# Patient Record
Sex: Female | Born: 1940 | Race: White | Hispanic: No | Smoking: Former smoker
Health system: Southern US, Community
[De-identification: ages and names within clinical notes are randomized; demographics above are authoritative.]

## PROBLEM LIST (undated history)

## (undated) DIAGNOSIS — K219 Gastro-esophageal reflux disease without esophagitis: Secondary | ICD-10-CM

## (undated) DIAGNOSIS — I519 Heart disease, unspecified: Secondary | ICD-10-CM

## (undated) DIAGNOSIS — E119 Type 2 diabetes mellitus without complications: Secondary | ICD-10-CM

## (undated) DIAGNOSIS — I10 Essential (primary) hypertension: Secondary | ICD-10-CM

## (undated) DIAGNOSIS — C801 Malignant (primary) neoplasm, unspecified: Secondary | ICD-10-CM

## (undated) HISTORY — PX: ABDOMINAL HYSTERECTOMY: SHX81

## (undated) HISTORY — PX: APPENDECTOMY: SHX54

## (undated) HISTORY — PX: TONSILLECTOMY: SUR1361

## (undated) HISTORY — PX: BREAST SURGERY: SHX581

---

## 1997-06-01 DIAGNOSIS — C50919 Malignant neoplasm of unspecified site of unspecified female breast: Secondary | ICD-10-CM | POA: Insufficient documentation

## 2004-08-18 ENCOUNTER — Emergency Department: Payer: Self-pay | Admitting: Emergency Medicine

## 2006-11-01 ENCOUNTER — Emergency Department: Payer: Self-pay | Admitting: Emergency Medicine

## 2007-05-13 DIAGNOSIS — K2270 Barrett's esophagus without dysplasia: Secondary | ICD-10-CM | POA: Insufficient documentation

## 2008-01-28 ENCOUNTER — Emergency Department: Payer: Self-pay | Admitting: Emergency Medicine

## 2009-08-04 ENCOUNTER — Emergency Department: Payer: Self-pay | Admitting: Emergency Medicine

## 2010-10-20 ENCOUNTER — Emergency Department: Payer: Self-pay | Admitting: Emergency Medicine

## 2013-03-09 ENCOUNTER — Emergency Department: Payer: Self-pay | Admitting: Emergency Medicine

## 2014-02-12 ENCOUNTER — Emergency Department: Payer: Self-pay | Admitting: Emergency Medicine

## 2014-05-10 DIAGNOSIS — G4730 Sleep apnea, unspecified: Secondary | ICD-10-CM | POA: Diagnosis present

## 2014-05-30 DIAGNOSIS — R911 Solitary pulmonary nodule: Secondary | ICD-10-CM | POA: Insufficient documentation

## 2014-09-02 ENCOUNTER — Ambulatory Visit: Payer: Self-pay

## 2014-09-19 ENCOUNTER — Emergency Department: Payer: Self-pay | Admitting: Emergency Medicine

## 2014-09-19 LAB — COMPREHENSIVE METABOLIC PANEL
Albumin: 3.1 g/dL — ABNORMAL LOW (ref 3.4–5.0)
Alkaline Phosphatase: 94 U/L
Anion Gap: 7 (ref 7–16)
BUN: 10 mg/dL (ref 7–18)
Bilirubin,Total: 0.3 mg/dL (ref 0.2–1.0)
Calcium, Total: 8.6 mg/dL (ref 8.5–10.1)
Chloride: 107 mmol/L (ref 98–107)
Co2: 25 mmol/L (ref 21–32)
Creatinine: 0.87 mg/dL (ref 0.60–1.30)
EGFR (African American): >60
EGFR (Non-African Amer.): >60
Glucose: 120 mg/dL — ABNORMAL HIGH (ref 65–99)
Osmolality: 278 (ref 275–301)
Potassium: 3.6 mmol/L (ref 3.5–5.1)
SGOT(AST): 91 U/L — ABNORMAL HIGH (ref 15–37)
SGPT (ALT): 85 U/L — ABNORMAL HIGH
Sodium: 139 mmol/L (ref 136–145)
Total Protein: 7.4 g/dL (ref 6.4–8.2)

## 2014-09-19 LAB — CBC WITH DIFFERENTIAL/PLATELET
Basophil #: 0.1 10*3/uL (ref 0.0–0.1)
Basophil %: 0.6 %
Eosinophil #: 0.3 10*3/uL (ref 0.0–0.7)
Eosinophil %: 2.6 %
HCT: 37.8 % (ref 35.0–47.0)
HGB: 12.2 g/dL (ref 12.0–16.0)
Lymphocyte #: 2.6 10*3/uL (ref 1.0–3.6)
Lymphocyte %: 23.7 %
MCH: 27.5 pg (ref 26.0–34.0)
MCHC: 32.4 g/dL (ref 32.0–36.0)
MCV: 85 fL (ref 80–100)
Monocyte #: 0.6 x10 3/mm (ref 0.2–0.9)
Monocyte %: 5.3 %
Neutrophil #: 7.6 10*3/uL — ABNORMAL HIGH (ref 1.4–6.5)
Neutrophil %: 67.8 %
Platelet: 266 10*3/uL (ref 150–440)
RBC: 4.44 10*6/uL (ref 3.80–5.20)
RDW: 16.1 % — ABNORMAL HIGH (ref 11.5–14.5)
WBC: 11.2 10*3/uL — ABNORMAL HIGH (ref 3.6–11.0)

## 2014-11-11 ENCOUNTER — Emergency Department: Payer: Self-pay | Admitting: Emergency Medicine

## 2014-11-12 ENCOUNTER — Emergency Department: Payer: Self-pay | Admitting: Emergency Medicine

## 2014-12-09 ENCOUNTER — Emergency Department: Payer: Self-pay | Admitting: Emergency Medicine

## 2014-12-28 ENCOUNTER — Emergency Department
Admit: 2014-12-28 | Disposition: A | Discharge status: Home / Self Care | Payer: Self-pay | Admitting: Emergency Medicine

## 2015-01-02 ENCOUNTER — Ambulatory Visit
Admit: 2015-01-02 | Disposition: A | Discharge status: Home / Self Care | Payer: Self-pay | Attending: Family Medicine | Admitting: Family Medicine

## 2015-01-07 ENCOUNTER — Emergency Department: Admit: 2015-01-07 | Disposition: A | Discharge status: Home / Self Care | Payer: Self-pay

## 2015-01-07 LAB — BASIC METABOLIC PANEL
Anion Gap: 7 (ref 7–16)
BUN: 16 mg/dL
Calcium, Total: 9.4 mg/dL
Chloride: 103 mmol/L
Co2: 29 mmol/L
Creatinine: 0.69 mg/dL
EGFR (African American): >60
EGFR (Non-African Amer.): >60
Glucose: 125 mg/dL — ABNORMAL HIGH
Potassium: 3.9 mmol/L
Sodium: 139 mmol/L

## 2015-01-07 LAB — URINALYSIS, COMPLETE
Bilirubin,UR: NEGATIVE
Blood: NEGATIVE
Glucose,UR: NEGATIVE mg/dL (ref 0–75)
Ketone: NEGATIVE
Leukocyte Esterase: NEGATIVE
Nitrite: NEGATIVE
Ph: 7 (ref 4.5–8.0)
Protein: NEGATIVE
Specific Gravity: 1.014 (ref 1.003–1.030)

## 2015-01-07 LAB — CBC WITH DIFFERENTIAL/PLATELET
Basophil #: 0.1 10*3/uL (ref 0.0–0.1)
Basophil %: 0.7 %
Eosinophil #: 0.3 10*3/uL (ref 0.0–0.7)
Eosinophil %: 2.8 %
HCT: 34.8 % — ABNORMAL LOW (ref 35.0–47.0)
HGB: 11.2 g/dL — ABNORMAL LOW (ref 12.0–16.0)
Lymphocyte #: 2.5 10*3/uL (ref 1.0–3.6)
Lymphocyte %: 22.4 %
MCH: 26.7 pg (ref 26.0–34.0)
MCHC: 32.2 g/dL (ref 32.0–36.0)
MCV: 83 fL (ref 80–100)
Monocyte #: 0.8 x10 3/mm (ref 0.2–0.9)
Monocyte %: 7 %
Neutrophil #: 7.4 10*3/uL — ABNORMAL HIGH (ref 1.4–6.5)
Neutrophil %: 67.1 %
Platelet: 219 10*3/uL (ref 150–440)
RBC: 4.19 10*6/uL (ref 3.80–5.20)
RDW: 16.5 % — ABNORMAL HIGH (ref 11.5–14.5)
WBC: 11 10*3/uL (ref 3.6–11.0)

## 2015-02-06 ENCOUNTER — Emergency Department
Admission: EM | Admit: 2015-02-06 | Discharge: 2015-02-06 | Disposition: A | Discharge status: Home / Self Care | Payer: Medicare Other | Attending: Emergency Medicine | Admitting: Emergency Medicine

## 2015-02-06 ENCOUNTER — Emergency Department: Payer: Medicare Other

## 2015-02-06 ENCOUNTER — Other Ambulatory Visit: Payer: Self-pay

## 2015-02-06 ENCOUNTER — Encounter: Payer: Self-pay | Admitting: Emergency Medicine

## 2015-02-06 DIAGNOSIS — E119 Type 2 diabetes mellitus without complications: Secondary | ICD-10-CM

## 2015-02-06 DIAGNOSIS — I10 Essential (primary) hypertension: Secondary | ICD-10-CM | POA: Insufficient documentation

## 2015-02-06 DIAGNOSIS — Z87891 Personal history of nicotine dependence: Secondary | ICD-10-CM | POA: Insufficient documentation

## 2015-02-06 DIAGNOSIS — R42 Dizziness and giddiness: Secondary | ICD-10-CM | POA: Diagnosis present

## 2015-02-06 HISTORY — DX: Malignant (primary) neoplasm, unspecified: C80.1

## 2015-02-06 HISTORY — DX: Heart disease, unspecified: I51.9

## 2015-02-06 HISTORY — DX: Gastro-esophageal reflux disease without esophagitis: K21.9

## 2015-02-06 HISTORY — DX: Essential (primary) hypertension: I10

## 2015-02-06 LAB — URINALYSIS COMPLETE WITH MICROSCOPIC (ARMC ONLY)
Bilirubin Urine: NEGATIVE
Glucose, UA: 50 mg/dL — AB
Hgb urine dipstick: NEGATIVE
Ketones, ur: NEGATIVE mg/dL
Leukocytes, UA: NEGATIVE
Nitrite: NEGATIVE
Protein, ur: NEGATIVE mg/dL
Specific Gravity, Urine: 1.019 (ref 1.005–1.030)
pH: 6 (ref 5.0–8.0)

## 2015-02-06 LAB — CBC
HCT: 34 % — ABNORMAL LOW (ref 35.0–47.0)
Hemoglobin: 11.3 g/dL — ABNORMAL LOW (ref 12.0–16.0)
MCH: 29.1 pg (ref 26.0–34.0)
MCHC: 33.3 g/dL (ref 32.0–36.0)
MCV: 87.3 fL (ref 80.0–100.0)
Platelets: 242 10*3/uL (ref 150–440)
RBC: 3.9 MIL/uL (ref 3.80–5.20)
RDW: 16.5 % — ABNORMAL HIGH (ref 11.5–14.5)
WBC: 9.6 10*3/uL (ref 3.6–11.0)

## 2015-02-06 LAB — BASIC METABOLIC PANEL
Anion gap: 10 (ref 5–15)
BUN: 20 mg/dL (ref 6–20)
CO2: 23 mmol/L (ref 22–32)
Calcium: 9 mg/dL (ref 8.9–10.3)
Chloride: 106 mmol/L (ref 101–111)
Creatinine, Ser: 0.71 mg/dL (ref 0.44–1.00)
GFR calc Af Amer: >60 mL/min (ref >60)
GFR calc non Af Amer: >60 mL/min (ref >60)
Glucose, Bld: 269 mg/dL — ABNORMAL HIGH (ref 65–99)
Potassium: 3.7 mmol/L (ref 3.5–5.1)
Sodium: 139 mmol/L (ref 135–145)

## 2015-02-06 LAB — GLUCOSE, CAPILLARY: Glucose-Capillary: 252 mg/dL — ABNORMAL HIGH (ref 65–99)

## 2015-02-06 MED ORDER — ACETAMINOPHEN 500 MG PO TABS
1000.0000 mg | ORAL_TABLET | Freq: Once | ORAL | Status: AC
  Administered 2015-02-06: 1000 mg via ORAL

## 2015-02-06 MED ORDER — ACETAMINOPHEN 500 MG PO TABS
ORAL_TABLET | ORAL | Status: AC
  Administered 2015-02-06: 1000 mg via ORAL
  Filled 2015-02-06: qty 2

## 2015-02-06 MED ORDER — METFORMIN HCL 500 MG PO TABS: 500.0000 mg | ORAL_TABLET | Freq: Two times a day (BID) | ORAL | Status: DC

## 2015-02-06 MED ORDER — METFORMIN HCL ER 500 MG PO TB24: 500.0000 mg | ORAL_TABLET | Freq: Two times a day (BID) | ORAL | Status: DC

## 2015-02-06 NOTE — ED Provider Notes (Signed)
[facility] Emergency Department Provider Note  Time seen: 6:14 PM  I have reviewed the triage vital signs and the nursing notes.   HISTORY  Chief Complaint Dizziness    HPI Tracy Dickerson is a 74 y.o. female with a past medical history of hypertension who presents the emergency department with dizziness. According to the patient she was at the drug store earlier this morning and became dizzy, she then went to [facility] several hours later and had another dizzy spell. Patient denies headache, confusion, slurred speech, weakness or numbness of any arm or leg. Does note a mild cough. Denies abdominal pain, nausea/vomiting/diarrhea. Denies dysuria but does note increased frequency of urination.    Past Medical History  Diagnosis Date  . GERD (gastroesophageal reflux disease)   . Hypertension   . Heart disease   . Cancer     Breast    There are no active problems to display for this patient.   Past Surgical History  Procedure Laterality Date  . Abdominal hysterectomy    . Appendectomy    . Tonsillectomy    . Breast surgery      breast cancer    No current outpatient prescriptions on file.  Allergies Review of patient's allergies indicates not on file.  Family History  Problem Relation Age of Onset  . Cancer Mother     Social History History  Substance Use Topics  . Smoking status: Former Research scientist (life sciences)  . Smokeless tobacco: Never Used  . Alcohol Use: No    Review of Systems Constitutional: Negative for fever. Cardiovascular: Negative for chest pain. Respiratory: Negative for shortness of breath. Gastrointestinal: Negative for abdominal pain, vomiting and diarrhea. Genitourinary: Negative for dysuria. Positive for urinary frequency. Neurological: Negative for headaches, focal weakness or numbness.  10-point ROS otherwise negative.  ____________________________________________   PHYSICAL EXAM:  VITAL SIGNS: ED Triage Vitals  Enc Vitals  Group     BP 02/06/15 1539 131/65 mmHg     Pulse Rate 02/06/15 1539 104     Resp 02/06/15 1539 20     Temp 02/06/15 1539 97.7 F (36.5 C)     Temp Source 02/06/15 1539 Oral     SpO2 02/06/15 1539 97 %     Weight 02/06/15 1539 263 lb (119.296 kg)     Height 02/06/15 1539 '5\' 6"'$  (1.676 m)     Head Cir --      Peak Flow --      Pain Score 02/06/15 1540 9     Pain Loc --      Pain Edu? --      Excl. in [place]? --     Constitutional: Alert and oriented. Well appearing and in no distress. Eyes: Normal exam ENT   Mouth/Throat: Mucous membranes are moist. Cardiovascular: Normal rate, regular rhythm. No murmurs, rubs, or gallops. Respiratory: Normal respiratory effort without tachypnea nor retractions. Breath sounds are clear and equal bilaterally. No wheezes/rales/rhonchi. Gastrointestinal: Soft and nontender. No distention. Musculoskeletal: Nontender with normal range of motion in all extremities.  Neurologic:  Normal speech and language. No gross focal neurologic deficits Skin:  Skin is warm, dry and intact.  Psychiatric: Mood and affect are normal. Speech and behavior are normal.   ____________________________________________    EKG  EKG shows sinus tachycardia at 103 bpm, normal axis, normal intervals, no concerning ST changes noted.  ____________________________________________    RADIOLOGY  CT head within normal limits.  ____________________________________________   INITIAL IMPRESSION / ASSESSMENT AND PLAN /  ED COURSE  Pertinent labs & imaging results that were available during my care of the patient were reviewed by me and considered in my medical decision making (see chart for details).  Patient with intermittent dizziness symptoms 1 day. She does note increased urinary frequency as well. Labs show an elevated blood glucose level. The patient states she has not eaten for approximately 8 hours. I will add on the urinalysis, and a hemoglobin A1c to further evaluate.  Concern for possible new onset diabetes.  Urinalysis shows glucose otherwise within normal limits. We'll discharge home on metformin and primary care follow-up on Friday for an INR recheck as well as further education and evaluation/ treatment. ____________________________________________   FINAL CLINICAL IMPRESSION(S) / ED DIAGNOSES  Dizziness Type 2 diabetes (new onset)   Harvest Dark, MD 02/06/15 2010

## 2015-02-06 NOTE — ED Notes (Signed)
Patient presents to ED with complaints of dizziness since rising this a.m. States she was at [employer] with her brother and she felt dizzy; states brother kept her from falling. Reports continued dizziness and L-sided headache.

## 2015-02-06 NOTE — Discharge Instructions (Signed)
High Blood Sugar High blood sugar (hyperglycemia) means that the level of sugar in your blood is higher than it should be. Signs of high blood sugar include:  Feeling thirsty.  Frequent peeing (urinating).  Feeling tired or sleepy.  Dry mouth.  Vision changes.  Feeling weak.  Feeling hungry but losing weight.  Numbness and tingling in your hands or feet.  Headache. When you ignore these signs, your blood sugar may keep going up. These problems may get worse, and other problems may begin. HOME CARE  Check your blood sugars as told by your doctor. Write down the numbers with the date and time.  Take the right amount of insulin or diabetes pills at the right time. Write down the dose with date and time.  Refill your insulin or diabetes pills before running out.  Watch what you eat. Follow your meal plan.  Drink liquids without sugar, such as water. Check with your doctor if you have kidney or heart disease.  Follow your doctor's orders for exercise. Exercise at the same time of day.  Keep your doctor's appointments. GET HELP RIGHT AWAY IF:   You have trouble thinking or are confused.  You have fast breathing with fruity smelling breath.  You pass out (faint).  You have 2 to 3 days of high blood sugars and you do not know why.  You have chest pain.  You are feeling sick to your stomach (nauseous) or throwing up (vomiting).  You have sudden vision changes. MAKE SURE YOU:   Understand these instructions.  Will watch your condition.  Will get help right away if you are not doing well or get worse. Document Released: 07/07/2009 Document Revised: 12/02/2011 Document Reviewed: 07/07/2009 [facility] Patient Information 2015 [place], [place]. This information is not intended to replace advice given to you by your health care provider. Make sure you discuss any questions you have with your health care provider.  Diabetes and Standards of Medical Care Diabetes is  complicated. You may find that your diabetes team includes a dietitian, nurse, diabetes educator, eye doctor, and more. To help everyone know what is going on and to help you get the care you deserve, the following schedule of care was developed to help keep you on track. Below are the tests, exams, vaccines, medicines, education, and plans you will need. HbA1c test This test shows how well you have controlled your glucose over the past 2-3 months. It is used to see if your diabetes management plan needs to be adjusted.   It is performed at least 2 times a year if you are meeting treatment goals.  It is performed 4 times a year if therapy has changed or if you are not meeting treatment goals. Blood pressure test  This test is performed at every routine medical visit. The goal is less than 140/90 mm Hg for most people, but 130/80 mm Hg in some cases. Ask your health care provider about your goal. Dental exam  Follow up with the dentist regularly. Eye exam  If you are diagnosed with type 1 diabetes as a child, get an exam upon reaching the age of 94 years or older and have had diabetes for 3-5 years. Yearly eye exams are recommended after that initial eye exam.  If you are diagnosed with type 1 diabetes as an adult, get an exam within 5 years of diagnosis and then yearly.  If you are diagnosed with type 2 diabetes, get an exam as soon as possible after the  diagnosis and then yearly. Foot care exam  Visual foot exams are performed at every routine medical visit. The exams check for cuts, injuries, or other problems with the feet.  A comprehensive foot exam should be done yearly. This includes visual inspection as well as assessing foot pulses and testing for loss of sensation.  Check your feet nightly for cuts, injuries, or other problems with your feet. Tell your health care provider if anything is not healing. Kidney function test (urine microalbumin)  This test is performed once a  year.  Type 1 diabetes: The first test is performed 5 years after diagnosis.  Type 2 diabetes: The first test is performed at the time of diagnosis.  A serum creatinine and estimated glomerular filtration rate (eGFR) test is done once a year to assess the level of chronic kidney disease (CKD), if present. Lipid profile (cholesterol, HDL, LDL, triglycerides)  Performed every 5 years for most people.  The goal for LDL is less than 100 mg/dL. If you are at high risk, the goal is less than 70 mg/dL.  The goal for HDL is 40 mg/dL-50 mg/dL for men and 50 mg/dL-60 mg/dL for women. An HDL cholesterol of 60 mg/dL or higher gives some protection against heart disease.  The goal for triglycerides is less than 150 mg/dL. Influenza vaccine, pneumococcal vaccine, and hepatitis B vaccine  The influenza vaccine is recommended yearly.  It is recommended that people with diabetes who are over 45 years old get the pneumonia vaccine. In some cases, two separate shots may be given. Ask your health care provider if your pneumonia vaccination is up to date.  The hepatitis B vaccine is also recommended for adults with diabetes. Diabetes self-management education  Education is recommended at diagnosis and ongoing as needed. Treatment plan  Your treatment plan is reviewed at every medical visit. Document Released: 07/07/2009 Document Revised: 01/24/2014 Document Reviewed: 02/09/2013 [facility] Patient Information 2015 [place], [place]. This information is not intended to replace advice given to you by your health care provider. Make sure you discuss any questions you have with your health care provider.

## 2015-02-07 LAB — HEMOGLOBIN A1C: Hgb A1c MFr Bld: 7.8 % — ABNORMAL HIGH (ref 4.0–6.0)

## 2015-06-08 ENCOUNTER — Encounter: Payer: Self-pay | Admitting: Emergency Medicine

## 2015-06-08 ENCOUNTER — Emergency Department
Admission: EM | Admit: 2015-06-08 | Discharge: 2015-06-08 | Disposition: A | Discharge status: Home / Self Care | Payer: Medicare Other | Attending: Emergency Medicine | Admitting: Emergency Medicine

## 2015-06-08 DIAGNOSIS — Z87891 Personal history of nicotine dependence: Secondary | ICD-10-CM | POA: Diagnosis not present

## 2015-06-08 DIAGNOSIS — R51 Headache: Secondary | ICD-10-CM | POA: Diagnosis present

## 2015-06-08 DIAGNOSIS — Z79899 Other long term (current) drug therapy: Secondary | ICD-10-CM | POA: Insufficient documentation

## 2015-06-08 DIAGNOSIS — Z880 Allergy status to penicillin: Secondary | ICD-10-CM | POA: Insufficient documentation

## 2015-06-08 DIAGNOSIS — I10 Essential (primary) hypertension: Secondary | ICD-10-CM | POA: Insufficient documentation

## 2015-06-08 DIAGNOSIS — Z791 Long term (current) use of non-steroidal anti-inflammatories (NSAID): Secondary | ICD-10-CM | POA: Diagnosis not present

## 2015-06-08 DIAGNOSIS — G44209 Tension-type headache, unspecified, not intractable: Secondary | ICD-10-CM | POA: Diagnosis not present

## 2015-06-08 DIAGNOSIS — R42 Dizziness and giddiness: Secondary | ICD-10-CM | POA: Diagnosis not present

## 2015-06-08 LAB — CBC
HCT: 31.3 % — ABNORMAL LOW (ref 35.0–47.0)
Hemoglobin: 10.2 g/dL — ABNORMAL LOW (ref 12.0–16.0)
MCH: 27.1 pg (ref 26.0–34.0)
MCHC: 32.8 g/dL (ref 32.0–36.0)
MCV: 82.7 fL (ref 80.0–100.0)
Platelets: 241 10*3/uL (ref 150–440)
RBC: 3.78 MIL/uL — ABNORMAL LOW (ref 3.80–5.20)
RDW: 18.2 % — ABNORMAL HIGH (ref 11.5–14.5)
WBC: 8.5 10*3/uL (ref 3.6–11.0)

## 2015-06-08 LAB — URINALYSIS COMPLETE WITH MICROSCOPIC (ARMC ONLY)
Bacteria, UA: NONE SEEN
Bilirubin Urine: NEGATIVE
Glucose, UA: NEGATIVE mg/dL
Hgb urine dipstick: NEGATIVE
Ketones, ur: NEGATIVE mg/dL
Leukocytes, UA: NEGATIVE
Nitrite: NEGATIVE
Protein, ur: NEGATIVE mg/dL
Specific Gravity, Urine: 1.014 (ref 1.005–1.030)
pH: 6 (ref 5.0–8.0)

## 2015-06-08 LAB — BASIC METABOLIC PANEL
Anion gap: 7 (ref 5–15)
BUN: 21 mg/dL — ABNORMAL HIGH (ref 6–20)
CO2: 27 mmol/L (ref 22–32)
Calcium: 8.7 mg/dL — ABNORMAL LOW (ref 8.9–10.3)
Chloride: 104 mmol/L (ref 101–111)
Creatinine, Ser: 0.81 mg/dL (ref 0.44–1.00)
GFR calc Af Amer: >60 mL/min (ref >60)
GFR calc non Af Amer: >60 mL/min (ref >60)
Glucose, Bld: 125 mg/dL — ABNORMAL HIGH (ref 65–99)
Potassium: 3.6 mmol/L (ref 3.5–5.1)
Sodium: 138 mmol/L (ref 135–145)

## 2015-06-08 MED ORDER — DIAZEPAM 5 MG PO TABS: 5.0000 mg | ORAL_TABLET | Freq: Three times a day (TID) | ORAL | Status: DC | PRN

## 2015-06-08 MED ORDER — METOCLOPRAMIDE HCL 10 MG PO TABS: 10.0000 mg | ORAL_TABLET | Freq: Three times a day (TID) | ORAL | Status: DC

## 2015-06-08 MED ORDER — NAPROXEN 500 MG PO TABS: 500.0000 mg | ORAL_TABLET | Freq: Two times a day (BID) | ORAL | Status: DC

## 2015-06-08 MED ORDER — DIPHENHYDRAMINE HCL 25 MG PO CAPS
25.0000 mg | ORAL_CAPSULE | ORAL | Status: AC
  Administered 2015-06-08: 25 mg via ORAL
  Filled 2015-06-08: qty 1

## 2015-06-08 MED ORDER — DIPHENHYDRAMINE HCL 25 MG PO CAPS: 50.0000 mg | ORAL_CAPSULE | Freq: Four times a day (QID) | ORAL | Status: DC | PRN

## 2015-06-08 MED ORDER — DIAZEPAM 5 MG PO TABS
5.0000 mg | ORAL_TABLET | Freq: Once | ORAL | Status: AC
  Administered 2015-06-08: 5 mg via ORAL
  Filled 2015-06-08: qty 1

## 2015-06-08 MED ORDER — METOCLOPRAMIDE HCL 10 MG PO TABS
10.0000 mg | ORAL_TABLET | Freq: Once | ORAL | Status: AC
  Administered 2015-06-08: 10 mg via ORAL
  Filled 2015-06-08 (×2): qty 1

## 2015-06-08 NOTE — Discharge Instructions (Signed)
Dizziness Dizziness is a common problem. It is a feeling of unsteadiness or light-headedness. You may feel like you are about to faint. Dizziness can lead to injury if you stumble or fall. A person of any age group can suffer from dizziness, but dizziness is more common in older adults. CAUSES  Dizziness can be caused by many different things, including:  Middle ear problems.  Standing for too long.  Infections.  An allergic reaction.  Aging.  An emotional response to something, such as the sight of blood.  Side effects of medicines.  Tiredness.  Problems with circulation or blood pressure.  Excessive use of alcohol or medicines, or illegal drug use.  Breathing too fast (hyperventilation).  An irregular heart rhythm (arrhythmia).  A low red blood cell count (anemia).  Pregnancy.  Vomiting, diarrhea, fever, or other illnesses that cause body fluid loss (dehydration).  Diseases or conditions such as Parkinson's disease, high blood pressure (hypertension), diabetes, and thyroid problems.  Exposure to extreme heat. DIAGNOSIS  Your health care provider will ask about your symptoms, perform a physical exam, and perform an electrocardiogram (ECG) to record the electrical activity of your heart. Your health care provider may also perform other heart or blood tests to determine the cause of your dizziness. These may include:  Transthoracic echocardiogram (TTE). During echocardiography, sound waves are used to evaluate how blood flows through your heart.  Transesophageal echocardiogram (TEE).  Cardiac monitoring. This allows your health care provider to monitor your heart rate and rhythm in real time.  Holter monitor. This is a portable device that records your heartbeat and can help diagnose heart arrhythmias. It allows your health care provider to track your heart activity for several days if needed.  Stress tests by exercise or by giving medicine that makes the heart beat  faster. TREATMENT  Treatment of dizziness depends on the cause of your symptoms and can vary greatly. HOME CARE INSTRUCTIONS   Drink enough fluids to keep your urine clear or pale yellow. This is especially important in very hot weather. In older adults, it is also important in cold weather.  Take your medicine exactly as directed if your dizziness is caused by medicines. When taking blood pressure medicines, it is especially important to get up slowly.  Rise slowly from chairs and steady yourself until you feel okay.  In the morning, first sit up on the side of the bed. When you feel okay, stand slowly while holding onto something until you know your balance is fine.  Move your legs often if you need to stand in one place for a long time. Tighten and relax your muscles in your legs while standing.  Have someone stay with you for 1-2 days if dizziness continues to be a problem. Do this until you feel you are well enough to stay alone. Have the person call your health care provider if he or she notices changes in you that are concerning.  Do not drive or use heavy machinery if you feel dizzy.  Do not drink alcohol. SEEK IMMEDIATE MEDICAL CARE IF:   Your dizziness or light-headedness gets worse.  You feel nauseous or vomit.  You have problems talking, walking, or using your arms, hands, or legs.  You feel weak.  You are not thinking clearly or you have trouble forming sentences. It may take a friend or family member to notice this.  You have chest pain, abdominal pain, shortness of breath, or sweating.  Your vision changes.  You notice  any bleeding.  You have side effects from medicine that seems to be getting worse rather than better. MAKE SURE YOU:   Understand these instructions.  Will watch your condition.  Will get help right away if you are not doing well or get worse. Document Released: 03/05/2001 Document Revised: 09/14/2013 Document Reviewed: 03/29/2011 [facility]  Patient Information 2015 [place], [place]. This information is not intended to replace advice given to you by your health care provider. Make sure you discuss any questions you have with your health care provider.  General Headache Without Cause A headache is pain or discomfort felt around the head or neck area. The specific cause of a headache may not be found. There are many causes and types of headaches. A few common ones are:  Tension headaches.  Migraine headaches.  Cluster headaches.  Chronic daily headaches. HOME CARE INSTRUCTIONS   Keep all follow-up appointments with your caregiver or any specialist referral.  Only take over-the-counter or prescription medicines for pain or discomfort as directed by your caregiver.  Lie down in a dark, quiet room when you have a headache.  Keep a headache journal to find out what may trigger your migraine headaches. For example, write down:  What you eat and drink.  How much sleep you get.  Any change to your diet or medicines.  Try massage or other relaxation techniques.  Put ice packs or heat on the head and neck. Use these 3 to 4 times per day for 15 to 20 minutes each time, or as needed.  Limit stress.  Sit up straight, and do not tense your muscles.  Quit smoking if you smoke.  Limit alcohol use.  Decrease the amount of caffeine you drink, or stop drinking caffeine.  Eat and sleep on a regular schedule.  Get 7 to 9 hours of sleep, or as recommended by your caregiver.  Keep lights dim if bright lights bother you and make your headaches worse. SEEK MEDICAL CARE IF:   You have problems with the medicines you were prescribed.  Your medicines are not working.  You have a change from the usual headache.  You have nausea or vomiting. SEEK IMMEDIATE MEDICAL CARE IF:   Your headache becomes severe.  You have a fever.  You have a stiff neck.  You have loss of vision.  You have muscular weakness or loss of muscle  control.  You start losing your balance or have trouble walking.  You feel faint or pass out.  You have severe symptoms that are different from your first symptoms. MAKE SURE YOU:   Understand these instructions.  Will watch your condition.  Will get help right away if you are not doing well or get worse. Document Released: 09/09/2005 Document Revised: 12/02/2011 Document Reviewed: 09/25/2011 [facility] Patient Information 2015 [place], [place]. This information is not intended to replace advice given to you by your health care provider. Make sure you discuss any questions you have with your health care provider.  Tension Headache A tension headache is a feeling of pain, pressure, or aching often felt over the front and sides of the head. The pain can be dull or can feel tight (constricting). It is the most common type of headache. Tension headaches are not normally associated with nausea or vomiting and do not get worse with physical activity. Tension headaches can last 30 minutes to several days.  CAUSES  The exact cause is not known, but it may be caused by chemicals and hormones in the  brain that lead to pain. Tension headaches often begin after stress, anxiety, or depression. Other triggers may include:  Alcohol.  Caffeine (too much or withdrawal).  Respiratory infections (colds, flu, sinus infections).  Dental problems or teeth clenching.  Fatigue.  Holding your head and neck in one position too long while using a computer. SYMPTOMS   Pressure around the head.   Dull, aching head pain.   Pain felt over the front and sides of the head.   Tenderness in the muscles of the head, neck, and shoulders. DIAGNOSIS  A tension headache is often diagnosed based on:   Symptoms.   Physical examination.   A CT scan or MRI of your head. These tests may be ordered if symptoms are severe or unusual. TREATMENT  Medicines may be given to help relieve symptoms.  HOME CARE  INSTRUCTIONS   Only take over-the-counter or prescription medicines for pain or discomfort as directed by your caregiver.   Lie down in a dark, quiet room when you have a headache.   Keep a journal to find out what may be triggering your headaches. For example, write down:  What you eat and drink.  How much sleep you get.  Any change to your diet or medicines.  Try massage or other relaxation techniques.   Ice packs or heat applied to the head and neck can be used. Use these 3 to 4 times per day for 15 to 20 minutes each time, or as needed.   Limit stress.   Sit up straight, and do not tense your muscles.   Quit smoking if you smoke.  Limit alcohol use.  Decrease the amount of caffeine you drink, or stop drinking caffeine.  Eat and exercise regularly.  Get 7 to 9 hours of sleep, or as recommended by your caregiver.  Avoid excessive use of pain medicine as recurrent headaches can occur.  SEEK MEDICAL CARE IF:   You have problems with the medicines you were prescribed.  Your medicines do not work.  You have a change from the usual headache.  You have nausea or vomiting. SEEK IMMEDIATE MEDICAL CARE IF:   Your headache becomes severe.  You have a fever.  You have a stiff neck.  You have loss of vision.  You have muscular weakness or loss of muscle control.  You lose your balance or have trouble walking.  You feel faint or pass out.  You have severe symptoms that are different from your first symptoms. MAKE SURE YOU:   Understand these instructions.  Will watch your condition.  Will get help right away if you are not doing well or get worse. Document Released: 09/09/2005 Document Revised: 12/02/2011 Document Reviewed: 08/30/2011 [facility] Patient Information 2015 [place], [place]. This information is not intended to replace advice given to you by your health care provider. Make sure you discuss any questions you have with your health care  provider.

## 2015-06-08 NOTE — ED Notes (Signed)
Pt to ed via ems from home with c/o headache and dizziness x 3 weeks.

## 2015-06-08 NOTE — ED Provider Notes (Signed)
[facility] Emergency Department Provider Note  ____________________________________________  Time seen: 2:20 PM  I have reviewed the triage vital signs and the nursing notes.   HISTORY  Chief Complaint Headache    HPI Tracy Dickerson is a 74 y.o. female who complains of posterior headache for the past 3 weeks. It is been constant and is worse when she lies down flat on her back with her tachycardia of her head on her pillow. Gradual in onset 3 weeks ago. No difficulty moving her neck, no neck pain. He does feel tense in the left side of the neck just inferior to the skull base. And the pain does seem to be worse on the left side in the occipital area. If she moves her chin forward to stretch the muscles on the back of the neck it does seem to make the pain worse. No fever chills nausea vomiting. Normal oral intake. She does report some dizziness when she changes position quickly like sitting up from lying down in bed, which she reports a chronic issue which is unchanged.     Past Medical History  Diagnosis Date  . GERD (gastroesophageal reflux disease)   . Hypertension   . Heart disease   . Cancer     Breast     There are no active problems to display for this patient.    Past Surgical History  Procedure Laterality Date  . Abdominal hysterectomy    . Appendectomy    . Tonsillectomy    . Breast surgery      breast cancer     Current Outpatient Rx  Name  Route  Sig  Dispense  Refill  . diazepam (VALIUM) 5 MG tablet   Oral   Take 1 tablet (5 mg total) by mouth every 8 (eight) hours as needed for muscle spasms.   8 tablet   0   . diphenhydrAMINE (BENADRYL) 25 mg capsule   Oral   Take 2 capsules (50 mg total) by mouth every 6 (six) hours as needed.   60 capsule   0   . metFORMIN (GLUCOPHAGE) 500 MG tablet   Oral   Take 1 tablet (500 mg total) by mouth 2 (two) times daily.   60 tablet   1   . metoCLOPramide (REGLAN) 10 MG tablet  Oral   Take 1 tablet (10 mg total) by mouth 4 (four) times daily -  before meals and at bedtime.   60 tablet   0   . naproxen (NAPROSYN) 500 MG tablet   Oral   Take 1 tablet (500 mg total) by mouth 2 (two) times daily with a meal.   20 tablet   0      Allergies Penicillins   Family History  Problem Relation Age of Onset  . Cancer Mother     Social History Social History  Substance Use Topics  . Smoking status: Former Research scientist (life sciences)  . Smokeless tobacco: Never Used  . Alcohol Use: No    Review of Systems  Constitutional:   No fever or chills. No weight changes Eyes:   No blurry vision or double vision.  ENT:   No sore throat. Cardiovascular:   No chest pain. Respiratory:   No dyspnea or cough. Gastrointestinal:   Negative for abdominal pain, vomiting and diarrhea.  No BRBPR or melena. Genitourinary:   Negative for dysuria, urinary retention, bloody urine, or difficulty urinating. Musculoskeletal:   Negative for back pain. No joint swelling or pain. Skin:  Negative for rash. Neurological:   Positive headache as above, no focal weakness or paresthesia. No vision changes.s. Psychiatric:  No anxiety or depression.   Endocrine:  No hot/cold intolerance, changes in energy, or sleep difficulty.  10-point ROS otherwise negative.  ____________________________________________   PHYSICAL EXAM:  VITAL SIGNS: ED Triage Vitals  Enc Vitals Group     BP 06/08/15 1407 130/65 mmHg     Pulse Rate 06/08/15 1407 58     Resp 06/08/15 1407 18     Temp 06/08/15 1407 97.4 F (36.3 C)     Temp Source 06/08/15 1407 Oral     SpO2 06/08/15 1407 95 %     Weight --      Height --      Head Cir --      Peak Flow --      Pain Score 06/08/15 1149 9     Pain Loc --      Pain Edu? --      Excl. in [place]? --      Constitutional:   Alert and oriented. Well appearing and in no distress. Eyes:   No scleral icterus. No conjunctival pallor. PERRL. EOMI ENT   Head:   Normocephalic and  atraumatic.   Nose:   No congestion/rhinnorhea. No septal hematoma   Mouth/Throat:   MMM, no pharyngeal erythema. No peritonsillar mass. No uvula shift.   Neck:   No stridor. No SubQ emphysema. No meningismus. Hematological/Lymphatic/Immunilogical:   No cervical lymphadenopathy. Cardiovascular:   RRR. Normal and symmetric distal pulses are present in all extremities. No murmurs, rubs, or gallops. Respiratory:   Normal respiratory effort without tachypnea nor retractions. Breath sounds are clear and equal bilaterally. No wheezes/rales/rhonchi. Gastrointestinal:   Soft and nontender. No distention. There is no CVA tenderness.  No rebound, rigidity, or guarding. Genitourinary:   deferred Musculoskeletal:   Nontender with normal range of motion in all extremities. No joint effusions.  No lower extremity tenderness.  No edema. Neurologic:   Normal speech and language.  CN 2-10 normal. Motor grossly intact. No pronator drift.  Normal gait. No gross focal neurologic deficits are appreciated.  Skin:    Skin is warm, dry and intact. No rash noted.  No petechiae, purpura, or bullae. Psychiatric:   Mood and affect are normal. Speech and behavior are normal. Patient exhibits appropriate insight and judgment.  ____________________________________________    LABS (pertinent positives/negatives) (all labs ordered are listed, but only abnormal results are displayed) Labs Reviewed  BASIC METABOLIC PANEL - Abnormal; Notable for the following:    Glucose, Bld 125 (*)    BUN 21 (*)    Calcium 8.7 (*)    All other components within normal limits  CBC - Abnormal; Notable for the following:    RBC 3.78 (*)    Hemoglobin 10.2 (*)    HCT 31.3 (*)    RDW 18.2 (*)    All other components within normal limits  URINALYSIS COMPLETEWITH MICROSCOPIC ([facility] ONLY) - Abnormal; Notable for the following:    Color, Urine YELLOW (*)    APPearance CLEAR (*)    Squamous Epithelial / LPF 0-5 (*)    All other  components within normal limits   ____________________________________________   EKG  Interpreted by me  Date: 06/08/2015  Rate: 82  Rhythm: normal sinus rhythm  QRS Axis: normal  Intervals: normal  ST/T Wave abnormalities: normal  Conduction Disutrbances: none  Narrative Interpretation: unremarkable      ____________________________________________  RADIOLOGY    ____________________________________________   PROCEDURES   ____________________________________________   INITIAL IMPRESSION / ASSESSMENT AND PLAN / ED COURSE  Pertinent labs & imaging results that were available during my care of the patient were reviewed by me and considered in my medical decision making (see chart for details).  Labs unremarkable. History and exam are reassuring and are consistent with a tension headache. Very low suspicion for meningitis encephalitis fracture glaucoma temporal arteritis intra-cranial pressure, and troponin all hemorrhage or stroke. Patient is very well-appearing no acute distress. Give her some medications to help provide symptomatic relief and have her follow up with primary care.     ____________________________________________   FINAL CLINICAL IMPRESSION(S) / ED DIAGNOSES  Final diagnoses:  Tension headache  Orthostatic dizziness      Carrie Mew, MD 06/08/15 1453

## 2015-10-10 ENCOUNTER — Emergency Department
Admission: EM | Admit: 2015-10-10 | Discharge: 2015-10-10 | Discharge status: Against Medical Advice | Payer: Medicare Other | Attending: Emergency Medicine | Admitting: Emergency Medicine

## 2015-10-10 ENCOUNTER — Encounter: Payer: Self-pay | Admitting: Emergency Medicine

## 2015-10-10 DIAGNOSIS — I10 Essential (primary) hypertension: Secondary | ICD-10-CM | POA: Insufficient documentation

## 2015-10-10 DIAGNOSIS — R42 Dizziness and giddiness: Secondary | ICD-10-CM | POA: Diagnosis present

## 2015-10-10 LAB — BASIC METABOLIC PANEL
Anion gap: 9 (ref 5–15)
BUN: 16 mg/dL (ref 6–20)
CO2: 23 mmol/L (ref 22–32)
Calcium: 8.9 mg/dL (ref 8.9–10.3)
Chloride: 107 mmol/L (ref 101–111)
Creatinine, Ser: 0.81 mg/dL (ref 0.44–1.00)
GFR calc Af Amer: >60 mL/min (ref >60)
GFR calc non Af Amer: >60 mL/min (ref >60)
Glucose, Bld: 307 mg/dL — ABNORMAL HIGH (ref 65–99)
Potassium: 3.9 mmol/L (ref 3.5–5.1)
Sodium: 139 mmol/L (ref 135–145)

## 2015-10-10 LAB — CBC
HCT: 29.7 % — ABNORMAL LOW (ref 35.0–47.0)
Hemoglobin: 9.6 g/dL — ABNORMAL LOW (ref 12.0–16.0)
MCH: 27.9 pg (ref 26.0–34.0)
MCHC: 32.5 g/dL (ref 32.0–36.0)
MCV: 85.9 fL (ref 80.0–100.0)
Platelets: 229 10*3/uL (ref 150–440)
RBC: 3.46 MIL/uL — ABNORMAL LOW (ref 3.80–5.20)
RDW: 18 % — ABNORMAL HIGH (ref 11.5–14.5)
WBC: 8.9 10*3/uL (ref 3.6–11.0)

## 2015-10-10 NOTE — ED Notes (Signed)
Pt presents with dizziness for a few days.

## 2015-12-24 ENCOUNTER — Emergency Department: Payer: Medicare Other

## 2015-12-24 ENCOUNTER — Encounter: Payer: Self-pay | Admitting: Emergency Medicine

## 2015-12-24 ENCOUNTER — Emergency Department
Admission: EM | Admit: 2015-12-24 | Discharge: 2015-12-24 | Disposition: A | Discharge status: Home / Self Care | Payer: Medicare Other | Attending: Emergency Medicine | Admitting: Emergency Medicine

## 2015-12-24 DIAGNOSIS — Z79899 Other long term (current) drug therapy: Secondary | ICD-10-CM | POA: Diagnosis not present

## 2015-12-24 DIAGNOSIS — I119 Hypertensive heart disease without heart failure: Secondary | ICD-10-CM | POA: Diagnosis not present

## 2015-12-24 DIAGNOSIS — Z853 Personal history of malignant neoplasm of breast: Secondary | ICD-10-CM | POA: Diagnosis not present

## 2015-12-24 DIAGNOSIS — Z794 Long term (current) use of insulin: Secondary | ICD-10-CM | POA: Diagnosis not present

## 2015-12-24 DIAGNOSIS — R51 Headache: Secondary | ICD-10-CM | POA: Insufficient documentation

## 2015-12-24 DIAGNOSIS — Z87891 Personal history of nicotine dependence: Secondary | ICD-10-CM | POA: Insufficient documentation

## 2015-12-24 DIAGNOSIS — K219 Gastro-esophageal reflux disease without esophagitis: Secondary | ICD-10-CM | POA: Insufficient documentation

## 2015-12-24 DIAGNOSIS — R519 Headache, unspecified: Secondary | ICD-10-CM

## 2015-12-24 LAB — BASIC METABOLIC PANEL
Anion gap: 7 (ref 5–15)
BUN: 15 mg/dL (ref 6–20)
CO2: 24 mmol/L (ref 22–32)
Calcium: 9 mg/dL (ref 8.9–10.3)
Chloride: 107 mmol/L (ref 101–111)
Creatinine, Ser: 1.13 mg/dL — ABNORMAL HIGH (ref 0.44–1.00)
GFR calc Af Amer: 55 mL/min — ABNORMAL LOW (ref >60)
GFR calc non Af Amer: 47 mL/min — ABNORMAL LOW (ref >60)
Glucose, Bld: 122 mg/dL — ABNORMAL HIGH (ref 65–99)
Potassium: 3.9 mmol/L (ref 3.5–5.1)
Sodium: 138 mmol/L (ref 135–145)

## 2015-12-24 LAB — CBC
HCT: 27.9 % — ABNORMAL LOW (ref 35.0–47.0)
Hemoglobin: 9.3 g/dL — ABNORMAL LOW (ref 12.0–16.0)
MCH: 27.1 pg (ref 26.0–34.0)
MCHC: 33.2 g/dL (ref 32.0–36.0)
MCV: 81.7 fL (ref 80.0–100.0)
Platelets: 251 10*3/uL (ref 150–440)
RBC: 3.42 MIL/uL — ABNORMAL LOW (ref 3.80–5.20)
RDW: 18.3 % — ABNORMAL HIGH (ref 11.5–14.5)
WBC: 8.9 10*3/uL (ref 3.6–11.0)

## 2015-12-24 LAB — SEDIMENTATION RATE: Sed Rate: 72 mm/hr — ABNORMAL HIGH (ref 0–30)

## 2015-12-24 LAB — PROTIME-INR
INR: 2.35
Prothrombin Time: 25.5 seconds — ABNORMAL HIGH (ref 11.4–15.0)

## 2015-12-24 MED ORDER — PREDNISONE 20 MG PO TABS: 60.0000 mg | ORAL_TABLET | Freq: Every day | ORAL | Status: DC

## 2015-12-24 MED ORDER — TRAMADOL HCL 50 MG PO TABS
100.0000 mg | ORAL_TABLET | ORAL | Status: AC
  Administered 2015-12-24: 100 mg via ORAL
  Filled 2015-12-24: qty 2

## 2015-12-24 MED ORDER — PREDNISONE 20 MG PO TABS
60.0000 mg | ORAL_TABLET | Freq: Once | ORAL | Status: AC
  Administered 2015-12-24: 60 mg via ORAL
  Filled 2015-12-24: qty 3

## 2015-12-24 MED ORDER — SODIUM CHLORIDE 0.9 % IV BOLUS (SEPSIS)
500.0000 mL | Freq: Once | INTRAVENOUS | Status: AC
  Administered 2015-12-24: 500 mL via INTRAVENOUS

## 2015-12-24 NOTE — ED Provider Notes (Signed)
[facility] Emergency Department Provider Note  ____________________________________________  Time seen: Approximately 6:41 PM  I have reviewed the triage vital signs and the nursing notes.   HISTORY  Chief Complaint Headache    HPI Tracy Dickerson is a 75 y.o. female history of hypertension, heart disease, and lung cancer with previous resection currently on Coumadin.  Patient reports that she's had a headache over the very back of her skull for about the last 2 weeks, but is not severe but has been persistent and nagging headache. She denies any nausea vomiting, changes in vision, chest pain or neck pain. She has not had a fever. She reports she does get headaches from time to time, however it's unusual disorder has been persistent for about 2 weeks.  No fall or injury. No history of rheumatoid disease.  No numbness, tingling, weakness, abdominal pain or other concerns. Patient came on slowly, has been persisting. Does not improve with Tylenol at home.  She does report her brother died about a month ago, she witnessed this death and that may be causing some stress recently for her. No hallucinations. No desire to harm self.  Past Medical History  Diagnosis Date  . GERD (gastroesophageal reflux disease)   . Hypertension   . Heart disease   . Cancer ([place])     Breast and lung    There are no active problems to display for this patient.   Past Surgical History  Procedure Laterality Date  . Abdominal hysterectomy    . Appendectomy    . Tonsillectomy    . Breast surgery      breast cancer    Current Outpatient Rx  Name  Route  Sig  Dispense  Refill  . diazepam (VALIUM) 5 MG tablet   Oral   Take 1 tablet (5 mg total) by mouth every 8 (eight) hours as needed for muscle spasms.   8 tablet   0   . diphenhydrAMINE (BENADRYL) 25 mg capsule   Oral   Take 2 capsules (50 mg total) by mouth every 6 (six) hours as needed.   60 capsule   0   .  metFORMIN (GLUCOPHAGE) 500 MG tablet   Oral   Take 1 tablet (500 mg total) by mouth 2 (two) times daily.   60 tablet   1   . metoCLOPramide (REGLAN) 10 MG tablet   Oral   Take 1 tablet (10 mg total) by mouth 4 (four) times daily -  before meals and at bedtime.   60 tablet   0   . naproxen (NAPROSYN) 500 MG tablet   Oral   Take 1 tablet (500 mg total) by mouth 2 (two) times daily with a meal.   20 tablet   0   . predniSONE (DELTASONE) 20 MG tablet   Oral   Take 3 tablets (60 mg total) by mouth daily.   18 tablet   0     Allergies Percocet and Penicillins  Family History  Problem Relation Age of Onset  . Cancer Mother     Social History Social History  Substance Use Topics  . Smoking status: Former Research scientist (life sciences)  . Smokeless tobacco: Never Used  . Alcohol Use: Yes    Review of Systems Constitutional: No fever/chills Eyes: No visual changes. ENT: No sore throat. Cardiovascular: Denies chest pain. Respiratory: Denies shortness of breath. Gastrointestinal: No abdominal pain.  No nausea, no vomiting.  No diarrhea.  No constipation. Genitourinary: Negative for dysuria. Musculoskeletal:  Negative for back pain. Skin: Negative for rash. Neurological: Negative for focal weakness or numbness.  Occasionally feeling lightheaded with standing.  10-point ROS otherwise negative.  ____________________________________________   PHYSICAL EXAM:  VITAL SIGNS: ED Triage Vitals  Enc Vitals Group     BP 12/24/15 1538 96/45 mmHg     Pulse Rate 12/24/15 1538 74     Resp 12/24/15 1538 18     Temp 12/24/15 1538 97.9 F (36.6 C)     Temp Source 12/24/15 1538 Oral     SpO2 12/24/15 1538 96 %     Weight 12/24/15 1538 226 lb (102.513 kg)     Height 12/24/15 1538 _0  (1.727 m)     Head Cir --      Peak Flow --      Pain Score 12/24/15 1546 10     Pain Loc --      Pain Edu? --      Excl. in [place]? --    Constitutional: Alert and oriented. Well appearing and in no acute  distress. Eyes: Conjunctivae are normal. PERRL. EOMI. Head: Atraumatic. Nose: No congestion/rhinnorhea. Mouth/Throat: Mucous membranes are moist.  Oropharynx non-erythematous. Neck: No stridor.   Cardiovascular: Normal rate, regular rhythm. Grossly normal heart sounds.  Good peripheral circulation. Respiratory: Normal respiratory effort.  No retractions. Lungs CTAB. Gastrointestinal: Soft and nontender.  Musculoskeletal: No lower extremity tenderness nor edema.   Neurologic:    Detailed neuro exam performed. The patient has no pronator drift. The patient has normal cranial nerve exam. Extraocular movements are normal. Visual fields are normal. Patient has 5 out of 5 strength in all extremities. There is no numbness or gross, acute sensory abnormality in the extremities bilaterally. No speech disturbance. No dysarthria. No aphasia. No ataxia. Normal finger nose finger bilat. Patient speaking in full and clear sentences.   Normal speech and language. No gross focal neurologic deficits are appreciated. No gait instability. Skin:  Skin is warm, dry and intact. No rash noted. Psychiatric: Mood and affect are normal. Speech and behavior are normal.  ____________________________________________   LABS (all labs ordered are listed, but only abnormal results are displayed)  Labs Reviewed  PROTIME-INR - Abnormal; Notable for the following:    Prothrombin Time 25.5 (*)    All other components within normal limits  SEDIMENTATION RATE - Abnormal; Notable for the following:    Sed Rate 72 (*)    All other components within normal limits  BASIC METABOLIC PANEL - Abnormal; Notable for the following:    Glucose, Bld 122 (*)    Creatinine, Ser 1.13 (*)    GFR calc non Af Amer 47 (*)    GFR calc Af Amer 55 (*)    All other components within normal limits  CBC - Abnormal; Notable for the following:    RBC 3.42 (*)    Hemoglobin 9.3 (*)    HCT 27.9 (*)    RDW 18.3 (*)    All other  components within normal limits   ____________________________________________  EKG   ____________________________________________  RADIOLOGY   CT Head Wo Contrast (Final result) Result time: 12/24/15 19:47:20   Final result by Rad Results In Interface (12/24/15 19:47:20)   Narrative:   CLINICAL DATA: Headaches for 2 weeks  EXAM: CT HEAD WITHOUT CONTRAST  TECHNIQUE: Contiguous axial images were obtained from the base of the skull through the vertex without intravenous contrast.  COMPARISON: 02/06/2015  FINDINGS: Bony calvarium is intact. Diffuse atrophic changes are identified. No  findings to suggest acute hemorrhage, acute infarction or space-occupying mass lesion are noted.  IMPRESSION: Mild atrophic changes without acute abnormality.   Electronically Signed By: Inez Catalina M.D. On: 12/24/2015 19:47    ____________________________________________   PROCEDURES  Procedure(s) performed: None  Critical Care performed: No  ____________________________________________   INITIAL IMPRESSION / ASSESSMENT AND PLAN / ED COURSE  Pertinent labs & imaging results that were available during my care of the patient were reviewed by me and considered in my medical decision making (see chart for details).  Only patient presents with persistent headache for about 2 weeks. This is posterior and occipital in nature. No lesions or scalp abnormalities are noted, though she does report focal somewhat tenderness over the posterior occiput. No moving ripping or tearing pain. No chest or pulmonary symptoms. Given a history of cancer and that she is on Coumadin and I will obtain CT imaging the head.  Differential diagnosis includes but is not exclusive to subarachnoid hemorrhage, meningitis, encephalitis, previous head trauma, cavernous venous thrombosis (very unlikely on coumadin), muscle tension headache, temporal arteritis, migraine or migraine equivalent, etc.  No  sudden or acute onset headache to suggest aneurysm. No fever, no meningismus or infectious symptoms.  ----------------------------------------- 9:09 PM on 12/24/2015 -----------------------------------------  Discussed the case with [facility] rheumatologist on call via consult line, it's unclear if this patient may have arteritis, but given her persistent headache and elevated ESR we will treat her with prednisone. Rheumatology advises giving a one-week prescription for milligrams 60 prednisone daily, and they took her phone number and she is an established [facility] patient and they will call her to set up follow-up in the next 2-3 days for reevaluation. I think this is very reasonable, at this point I have no concern for intracranial hemorrhage, stroke, or other major headache syndrome however and mildly concerned about the possibility of vasculitis given the patient's age and elevated ESR and scalp tenderness. She does not have any frank tenderness over the temporal arteries however, and she has normal temporal arteries to pulsation on exam.  In addition I think the patient is likely under stress with the death of her brother may be slightly dehydrated. We will hydrate her gently, discussed with her follow-up and careful return precautions she is very agreeable. She'll follow-up closely with [facility] her primary care doctor and rheumatology and understands that they should be calling her tomorrow, she has not heard from them by the afternoon she will call to set up a follow-up appointment.  Patient reports improvement with tramadol. On recheck blood pressure normalized. She is in no distress awake and alert.  Return precautions and treatment recommendations and follow-up discussed with the patient who is agreeable with the plan.   ____________________________________________   FINAL CLINICAL IMPRESSION(S) / ED DIAGNOSES  Final diagnoses:  Headache, occipital      Delman Kitten, MD 12/24/15 2112

## 2015-12-24 NOTE — ED Notes (Addendum)
Patient arrived via EMS from home with c/o head pain in the back of her head for 2 weeks.  Patient denies injury to back of her head.  Area is painful to the touch but no obvious bruising noted.  Patient is on coumadin as well since dx with lung CA in 2015.  Denies any vision changes. Reports dizziness upon standing at times.  Also has a hx of breast cancer. Patient did state she took 2 tylenol prior to EMS arriving to her house.

## 2015-12-24 NOTE — Discharge Instructions (Signed)
Please follow up closely with your doctor at [facility], as well as with rheumatology. They should be calling you tomorrow to set up appointment for this week. This is very important you get follow-up this week for this headache.  Call your doctor or return to the ED if you have a worsening headache, any FEVER, sudden and severe headache, confusion, slurred speech, facial droop, weakness or numbness in any arm or leg, extreme fatigue, vision problems, or other symptoms that concern you.   General Headache Without Cause A headache is pain or discomfort felt around the head or neck area. The specific cause of a headache may not be found. There are many causes and types of headaches. A few common ones are:  Tension headaches.  Migraine headaches.  Cluster headaches.  Chronic daily headaches. HOME CARE INSTRUCTIONS  Watch your condition for any changes. Take these steps to help with your condition: Managing Pain  Take over-the-counter and prescription medicines only as told by your health care provider.  Lie down in a dark, quiet room when you have a headache.  If directed, apply ice to the head and neck area:  Put ice in a plastic bag.  Place a towel between your skin and the bag.  Leave the ice on for 20 minutes, 2-3 times per day.  Use a heating pad or hot shower to apply heat to the head and neck area as told by your health care provider.  Keep lights dim if bright lights bother you or make your headaches worse. Eating and Drinking  Eat meals on a regular schedule.  Limit alcohol use.  Decrease the amount of caffeine you drink, or stop drinking caffeine. General Instructions  Keep all follow-up visits as told by your health care provider. This is important.  Keep a headache journal to help find out what may trigger your headaches. For example, write down:  What you eat and drink.  How much sleep you get.  Any change to your diet or medicines.  Try massage or other  relaxation techniques.  Limit stress.  Sit up straight, and do not tense your muscles.  Do not use tobacco products, including cigarettes, chewing tobacco, or e-cigarettes. If you need help quitting, ask your health care provider.  Exercise regularly as told by your health care provider.  Sleep on a regular schedule. Get 7-9 hours of sleep, or the amount recommended by your health care provider. SEEK MEDICAL CARE IF:   Your symptoms are not helped by medicine.  You have a headache that is different from the usual headache.  You have nausea or you vomit.  You have a fever. SEEK IMMEDIATE MEDICAL CARE IF:   Your headache becomes severe.  You have repeated vomiting.  You have a stiff neck.  You have a loss of vision.  You have problems with speech.  You have pain in the eye or ear.  You have muscular weakness or loss of muscle control.  You lose your balance or have trouble walking.  You feel faint or pass out.  You have confusion.   This information is not intended to replace advice given to you by your health care provider. Make sure you discuss any questions you have with your health care provider.   Document Released: 09/09/2005 Document Revised: 05/31/2015 Document Reviewed: 01/02/2015 Elsevier Interactive Patient Education [employer].

## 2015-12-27 ENCOUNTER — Emergency Department
Admission: EM | Admit: 2015-12-27 | Discharge: 2015-12-27 | Discharge status: Against Medical Advice | Payer: Medicare Other | Attending: Emergency Medicine | Admitting: Emergency Medicine

## 2015-12-27 ENCOUNTER — Encounter: Payer: Self-pay | Admitting: *Deleted

## 2015-12-27 DIAGNOSIS — Z87891 Personal history of nicotine dependence: Secondary | ICD-10-CM | POA: Diagnosis not present

## 2015-12-27 DIAGNOSIS — R42 Dizziness and giddiness: Secondary | ICD-10-CM | POA: Insufficient documentation

## 2015-12-27 DIAGNOSIS — R739 Hyperglycemia, unspecified: Secondary | ICD-10-CM | POA: Insufficient documentation

## 2015-12-27 DIAGNOSIS — Z79899 Other long term (current) drug therapy: Secondary | ICD-10-CM | POA: Diagnosis not present

## 2015-12-27 DIAGNOSIS — I10 Essential (primary) hypertension: Secondary | ICD-10-CM | POA: Insufficient documentation

## 2015-12-27 DIAGNOSIS — R51 Headache: Secondary | ICD-10-CM | POA: Insufficient documentation

## 2015-12-27 DIAGNOSIS — Z90710 Acquired absence of both cervix and uterus: Secondary | ICD-10-CM | POA: Insufficient documentation

## 2015-12-27 DIAGNOSIS — Z7984 Long term (current) use of oral hypoglycemic drugs: Secondary | ICD-10-CM | POA: Insufficient documentation

## 2015-12-27 LAB — COMPREHENSIVE METABOLIC PANEL
ALT: 34 U/L (ref 14–54)
AST: 52 U/L — ABNORMAL HIGH (ref 15–41)
Albumin: 3.7 g/dL (ref 3.5–5.0)
Alkaline Phosphatase: 59 U/L (ref 38–126)
Anion gap: 10 (ref 5–15)
BUN: 27 mg/dL — ABNORMAL HIGH (ref 6–20)
CO2: 19 mmol/L — ABNORMAL LOW (ref 22–32)
Calcium: 8.8 mg/dL — ABNORMAL LOW (ref 8.9–10.3)
Chloride: 104 mmol/L (ref 101–111)
Creatinine, Ser: 1.65 mg/dL — ABNORMAL HIGH (ref 0.44–1.00)
GFR calc Af Amer: 35 mL/min — ABNORMAL LOW (ref >60)
GFR calc non Af Amer: 30 mL/min — ABNORMAL LOW (ref >60)
Glucose, Bld: 174 mg/dL — ABNORMAL HIGH (ref 65–99)
Potassium: 4.1 mmol/L (ref 3.5–5.1)
Sodium: 133 mmol/L — ABNORMAL LOW (ref 135–145)
Total Bilirubin: <0.1 mg/dL — ABNORMAL LOW (ref 0.3–1.2)
Total Protein: 7.2 g/dL (ref 6.5–8.1)

## 2015-12-27 LAB — GLUCOSE, CAPILLARY: Glucose-Capillary: 160 mg/dL — ABNORMAL HIGH (ref 65–99)

## 2015-12-27 LAB — CBC
HCT: 28.7 % — ABNORMAL LOW (ref 35.0–47.0)
Hemoglobin: 9.4 g/dL — ABNORMAL LOW (ref 12.0–16.0)
MCH: 28.4 pg (ref 26.0–34.0)
MCHC: 32.7 g/dL (ref 32.0–36.0)
MCV: 87 fL (ref 80.0–100.0)
Platelets: 288 10*3/uL (ref 150–440)
RBC: 3.31 MIL/uL — ABNORMAL LOW (ref 3.80–5.20)
RDW: 18.8 % — ABNORMAL HIGH (ref 11.5–14.5)
WBC: 13.5 10*3/uL — ABNORMAL HIGH (ref 3.6–11.0)

## 2015-12-27 LAB — SEDIMENTATION RATE: Sed Rate: 67 mm/hr — ABNORMAL HIGH (ref 0–30)

## 2015-12-27 NOTE — ED Notes (Signed)
Pt refusing to be seen at this time, pt reports she wants to go home and has already called for a taxi.

## 2015-12-27 NOTE — ED Notes (Addendum)
Pt arrives with complaints of headache for several months but now states she is having episodes of dizziness, states her CBG this AM was 341, pt states she was seen in ER Sunday night for headache and given steriods and states she does not feel any better, awake and alert in no acute distress, pt is on coumadin

## 2016-02-08 ENCOUNTER — Emergency Department
Admission: EM | Admit: 2016-02-08 | Discharge: 2016-02-08 | Disposition: A | Discharge status: Home / Self Care | Payer: Medicare Other | Attending: Emergency Medicine | Admitting: Emergency Medicine

## 2016-02-08 ENCOUNTER — Emergency Department: Payer: Medicare Other

## 2016-02-08 DIAGNOSIS — Z87891 Personal history of nicotine dependence: Secondary | ICD-10-CM | POA: Insufficient documentation

## 2016-02-08 DIAGNOSIS — Z7984 Long term (current) use of oral hypoglycemic drugs: Secondary | ICD-10-CM | POA: Diagnosis not present

## 2016-02-08 DIAGNOSIS — D649 Anemia, unspecified: Secondary | ICD-10-CM | POA: Diagnosis not present

## 2016-02-08 DIAGNOSIS — R51 Headache: Secondary | ICD-10-CM | POA: Diagnosis present

## 2016-02-08 DIAGNOSIS — L729 Follicular cyst of the skin and subcutaneous tissue, unspecified: Secondary | ICD-10-CM

## 2016-02-08 DIAGNOSIS — I10 Essential (primary) hypertension: Secondary | ICD-10-CM | POA: Diagnosis not present

## 2016-02-08 DIAGNOSIS — L723 Sebaceous cyst: Secondary | ICD-10-CM | POA: Insufficient documentation

## 2016-02-08 DIAGNOSIS — Z853 Personal history of malignant neoplasm of breast: Secondary | ICD-10-CM | POA: Diagnosis not present

## 2016-02-08 LAB — CBC
HCT: 25.3 % — ABNORMAL LOW (ref 35.0–47.0)
Hemoglobin: 8.4 g/dL — ABNORMAL LOW (ref 12.0–16.0)
MCH: 27.3 pg (ref 26.0–34.0)
MCHC: 33 g/dL (ref 32.0–36.0)
MCV: 82.9 fL (ref 80.0–100.0)
Platelets: 222 10*3/uL (ref 150–440)
RBC: 3.06 MIL/uL — ABNORMAL LOW (ref 3.80–5.20)
RDW: 19.5 % — ABNORMAL HIGH (ref 11.5–14.5)
WBC: 10.2 10*3/uL (ref 3.6–11.0)

## 2016-02-08 LAB — BASIC METABOLIC PANEL
Anion gap: 6 (ref 5–15)
BUN: 16 mg/dL (ref 6–20)
CO2: 29 mmol/L (ref 22–32)
Calcium: 8.7 mg/dL — ABNORMAL LOW (ref 8.9–10.3)
Chloride: 103 mmol/L (ref 101–111)
Creatinine, Ser: 1.03 mg/dL — ABNORMAL HIGH (ref 0.44–1.00)
GFR calc Af Amer: >60 mL/min (ref >60)
GFR calc non Af Amer: 53 mL/min — ABNORMAL LOW (ref >60)
Glucose, Bld: 106 mg/dL — ABNORMAL HIGH (ref 65–99)
Potassium: 3.8 mmol/L (ref 3.5–5.1)
Sodium: 138 mmol/L (ref 135–145)

## 2016-02-08 LAB — PROTIME-INR
INR: 2.37
Prothrombin Time: 25.6 seconds — ABNORMAL HIGH (ref 11.4–15.0)

## 2016-02-08 MED ORDER — TRAMADOL HCL 50 MG PO TABS: 50.0000 mg | ORAL_TABLET | Freq: Four times a day (QID) | ORAL | Status: AC | PRN

## 2016-02-08 MED ORDER — TRAMADOL HCL 50 MG PO TABS
ORAL_TABLET | ORAL | Status: AC
Start: 2016-02-08 — End: 2016-02-08
  Administered 2016-02-08: 50 mg via ORAL
  Filled 2016-02-08: qty 1

## 2016-02-08 MED ORDER — TRAMADOL HCL 50 MG PO TABS
50.0000 mg | ORAL_TABLET | Freq: Once | ORAL | Status: AC
  Administered 2016-02-08: 50 mg via ORAL

## 2016-02-08 NOTE — ED Notes (Signed)
Patient to ED via EMS for complaints of a headache x 4 months. States she was at [facility] this morning and they will be performing a biopsy of her head to see whats causing the headaches. Took her off of ibuprofen "because it was running my sugar up." Is here because her head hurts.

## 2016-02-08 NOTE — ED Provider Notes (Addendum)
[facility] Department Provider Note  ____________________________________________   I have reviewed the triage vital signs and the nursing notes.   HISTORY  Chief Complaint Headache    HPI Tracy Dickerson is a 75 y.o. female with a cystic like structure on the back of her head which is pain painful for for 5 months. Before Christmas she states. She states that tramadol is only thing that helps her but she does not have anymore tramadol at home at the moment. She denies any fever chills nausea vomiting. She denies any change in his chronic discomfort. Sometimes it's an "itchy pain". She can stress for the exact spot on her scalp where the lesion is. She denies any trauma or fall. She denies numbness or weakness. She has no other complaints aside for this chronic discomfort in this area. She is scheduled for an outpatient biopsy of it very soon.      Past Medical History  Diagnosis Date  . GERD (gastroesophageal reflux disease)   . Hypertension   . Heart disease   . Cancer ([place])     Breast and lung    There are no active problems to display for this patient.   Past Surgical History  Procedure Laterality Date  . Abdominal hysterectomy    . Appendectomy    . Tonsillectomy    . Breast surgery      breast cancer    Current Outpatient Rx  Name  Route  Sig  Dispense  Refill  . diazepam (VALIUM) 5 MG tablet   Oral   Take 1 tablet (5 mg total) by mouth every 8 (eight) hours as needed for muscle spasms.   8 tablet   0   . diphenhydrAMINE (BENADRYL) 25 mg capsule   Oral   Take 2 capsules (50 mg total) by mouth every 6 (six) hours as needed.   60 capsule   0   . EXPIRED: metFORMIN (GLUCOPHAGE) 500 MG tablet   Oral   Take 1 tablet (500 mg total) by mouth 2 (two) times daily.   60 tablet   1   . metoCLOPramide (REGLAN) 10 MG tablet   Oral   Take 1 tablet (10 mg total) by mouth 4 (four) times daily -  before meals and at bedtime.   60  tablet   0   . naproxen (NAPROSYN) 500 MG tablet   Oral   Take 1 tablet (500 mg total) by mouth 2 (two) times daily with a meal.   20 tablet   0   . predniSONE (DELTASONE) 20 MG tablet   Oral   Take 3 tablets (60 mg total) by mouth daily.   18 tablet   0     Allergies Percocet and Penicillins  Family History  Problem Relation Age of Onset  . Cancer Mother     Social History Social History  Substance Use Topics  . Smoking status: Former Research scientist (life sciences)  . Smokeless tobacco: Never Used  . Alcohol Use: Yes    Review of Systems Constitutional: No fever/chills Eyes: No visual changes. ENT: No sore throat. No stiff neck no neck pain Cardiovascular: Denies chest pain. Respiratory: Denies shortness of breath. Gastrointestinal:   no vomiting.  No diarrhea.  No constipation. Genitourinary: Negative for dysuria. Musculoskeletal: Negative lower extremity swelling Skin: Negative for rash. Neurological: Negative for headaches, focal weakness or numbness. 10-point ROS otherwise negative.  ____________________________________________   PHYSICAL EXAM:  VITAL SIGNS: ED Triage Vitals  Enc Vitals Group  BP 02/08/16 1932 113/55 mmHg     Pulse Rate 02/08/16 1932 89     Resp --      Temp 02/08/16 1932 97.8 F (36.6 C)     Temp Source 02/08/16 1932 Oral     SpO2 02/08/16 1932 98 %     Weight 02/08/16 1932 226 lb (102.513 kg)     Height 02/08/16 1932 '5\' 6"'$  (1.676 m)     Head Cir --      Peak Flow --      Pain Score 02/08/16 1941 9     Pain Loc --      Pain Edu? --      Excl. in [place]? --     Constitutional: Alert and oriented. Well appearing and in no acute distress. Eyes: Conjunctivae are normal. PERRL. EOMI. Head: Atraumatic. Nose: No congestion/rhinnorhea. Mouth/Throat: Mucous membranes are moist.  Oropharynx non-erythematous. Neck: No stridor.   Nontender with no meningismus Cardiovascular: Normal rate, regular rhythm. Grossly normal heart sounds.  Good peripheral  circulation. Respiratory: Normal respiratory effort.  No retractions. Lungs CTAB. Abdominal: Soft and nontender. No distention. No guarding no rebound Back:  There is no focal tenderness or step off there is no midline tenderness there are no lesions noted. there is no CVA tenderness Musculoskeletal: No lower extremity tenderness. No joint effusions, no DVT signs strong distal pulses no edema Neurologic:  Normal speech and language. No gross focal neurologic deficits are appreciated.  Skin:  Skin is warm, dry and intact. No rash noted.There is a very small area perhaps 2 cm of slight induration and swelling with no erythema not hot to touch in the occipital pleasant of the scalp. There is no straining cellulitic changes there is no break in the skin there is no pustule, it is not markedly tender to palpation, this is the exact spot that causes her discomfort apparently. Psychiatric: Mood and affect are normal. Speech and behavior are normal.  ____________________________________________   LABS (all labs ordered are listed, but only abnormal results are displayed)  Labs Reviewed  CBC - Abnormal; Notable for the following:    RBC 3.06 (*)    Hemoglobin 8.4 (*)    HCT 25.3 (*)    RDW 19.5 (*)    All other components within normal limits  BASIC METABOLIC PANEL - Abnormal; Notable for the following:    Glucose, Bld 106 (*)    Creatinine, Ser 1.03 (*)    Calcium 8.7 (*)    GFR calc non Af Amer 53 (*)    All other components within normal limits  PROTIME-INR   ____________________________________________  EKG  I personally interpreted any EKGs ordered by me or triage  ____________________________________________  RADIOLOGY  I reviewed any imaging ordered by me or triage that were performed during my shift and, if possible, patient and/or family made aware of any abnormal findings. ____________________________________________   PROCEDURES  Procedure(s) performed: None  Critical  Care performed: None  ____________________________________________   INITIAL IMPRESSION / ASSESSMENT AND PLAN / ED COURSE  Pertinent labs & imaging results that were available during my care of the patient were reviewed by me and considered in my medical decision making (see chart for details).  Patient with some cervical cystic lesion or scalp. No evidence of acute pathology on CT scan no evidence of bleed. She's had pain for a very long time with this. She is requesting tramadol for it. This is apparently well-known to her doctors, she has an outpatient appointment  with them for a biopsy. There is no evidence of acute abscess or infection, is consistent with a cyst. Patient will follow closely with her primary care doctor. Patient's hemoglobin is chronically low is slightly lower today, creatinine is much better than normal. No evidence of acute bleed. She is not here for any similar symptoms vital signs are reassuring.  ----------------------------------------- 10:41 PM on 02/08/2016 -----------------------------------------  The patient has a low hemoglobin but she chronically has that. She has no GI bleeding history, she refuses rectal exam. She understands limitation this placed upon the. She will follow-up with her doctor tomorrow for recheck. Return precautions and follow-up given and understood.   FINAL CLINICAL IMPRESSION(S) / ED DIAGNOSES  Final diagnoses:  None      This chart was dictated using voice recognition software.  Despite best efforts to proofread,  errors can occur which can change meaning.     Schuyler Amor, MD 02/08/16 2140  Schuyler Amor, MD 02/08/16 2141  Schuyler Amor, MD 02/08/16 2141  Schuyler Amor, MD 02/08/16 2242

## 2016-02-08 NOTE — Discharge Instructions (Signed)
Return to the emergency room for any new or worrisome symptoms including increased pain, shortness of breath, headache, weakness, any black stool any bloody stool or if you feel worse in any way. We do noticed that she return any mouth is somewhat worse than we have seen it before. hemoglobin is 8. You would prefer not to have a rectal exam which is not unreasonable but it does mean that there is no way I can tell you that there is no bleeding from your bottom. Therefore, if you have any black or bloody stool he must return immediately to the emergency room. Follow closely with your doctor tomorrow. Continue follow-up with her dermatologist for this cystic lesion on her scalp. If you have fever or increased pain or change in the size of it redness or swelling or something concerning happening in that area please return to the emergency room

## 2016-06-06 ENCOUNTER — Encounter: Payer: Self-pay | Admitting: Emergency Medicine

## 2016-06-06 ENCOUNTER — Emergency Department
Admission: EM | Admit: 2016-06-06 | Discharge: 2016-06-06 | Disposition: A | Discharge status: Home / Self Care | Payer: Medicare Other | Attending: Emergency Medicine | Admitting: Emergency Medicine

## 2016-06-06 ENCOUNTER — Emergency Department: Payer: Medicare Other

## 2016-06-06 DIAGNOSIS — J441 Chronic obstructive pulmonary disease with (acute) exacerbation: Secondary | ICD-10-CM | POA: Insufficient documentation

## 2016-06-06 DIAGNOSIS — Z87891 Personal history of nicotine dependence: Secondary | ICD-10-CM | POA: Insufficient documentation

## 2016-06-06 DIAGNOSIS — Z853 Personal history of malignant neoplasm of breast: Secondary | ICD-10-CM | POA: Insufficient documentation

## 2016-06-06 DIAGNOSIS — J40 Bronchitis, not specified as acute or chronic: Secondary | ICD-10-CM | POA: Diagnosis not present

## 2016-06-06 DIAGNOSIS — Z79899 Other long term (current) drug therapy: Secondary | ICD-10-CM | POA: Diagnosis not present

## 2016-06-06 DIAGNOSIS — I10 Essential (primary) hypertension: Secondary | ICD-10-CM | POA: Diagnosis not present

## 2016-06-06 DIAGNOSIS — Z7984 Long term (current) use of oral hypoglycemic drugs: Secondary | ICD-10-CM | POA: Diagnosis not present

## 2016-06-06 DIAGNOSIS — Z85118 Personal history of other malignant neoplasm of bronchus and lung: Secondary | ICD-10-CM | POA: Insufficient documentation

## 2016-06-06 DIAGNOSIS — R079 Chest pain, unspecified: Secondary | ICD-10-CM | POA: Diagnosis present

## 2016-06-06 LAB — BASIC METABOLIC PANEL
Anion gap: 6 (ref 5–15)
BUN: 18 mg/dL (ref 6–20)
CO2: 27 mmol/L (ref 22–32)
Calcium: 9 mg/dL (ref 8.9–10.3)
Chloride: 106 mmol/L (ref 101–111)
Creatinine, Ser: 1.03 mg/dL — ABNORMAL HIGH (ref 0.44–1.00)
GFR calc Af Amer: >60 mL/min (ref >60)
GFR calc non Af Amer: 53 mL/min — ABNORMAL LOW (ref >60)
Glucose, Bld: 105 mg/dL — ABNORMAL HIGH (ref 65–99)
Potassium: 4.1 mmol/L (ref 3.5–5.1)
Sodium: 139 mmol/L (ref 135–145)

## 2016-06-06 LAB — CBC
HCT: 25.1 % — ABNORMAL LOW (ref 35.0–47.0)
Hemoglobin: 8.1 g/dL — ABNORMAL LOW (ref 12.0–16.0)
MCH: 22.2 pg — ABNORMAL LOW (ref 26.0–34.0)
MCHC: 32.2 g/dL (ref 32.0–36.0)
MCV: 68.8 fL — ABNORMAL LOW (ref 80.0–100.0)
Platelets: 387 10*3/uL (ref 150–440)
RBC: 3.65 MIL/uL — ABNORMAL LOW (ref 3.80–5.20)
RDW: 18.7 % — ABNORMAL HIGH (ref 11.5–14.5)
WBC: 13.9 10*3/uL — ABNORMAL HIGH (ref 3.6–11.0)

## 2016-06-06 LAB — TROPONIN I: Troponin I: <0.03 ng/mL (ref <0.03)

## 2016-06-06 MED ORDER — PREDNISONE 20 MG PO TABS
60.0000 mg | ORAL_TABLET | Freq: Once | ORAL | Status: AC
  Administered 2016-06-06: 60 mg via ORAL
  Filled 2016-06-06: qty 3

## 2016-06-06 MED ORDER — DOXYCYCLINE HYCLATE 100 MG PO TABS
100.0000 mg | ORAL_TABLET | Freq: Once | ORAL | Status: AC
Start: 2016-06-06 — End: 2016-06-06
  Administered 2016-06-06: 100 mg via ORAL
  Filled 2016-06-06: qty 1

## 2016-06-06 MED ORDER — PREDNISONE 10 MG PO TABS: 10.0000 mg | ORAL_TABLET | Freq: Every day | ORAL | 0 refills | Status: DC

## 2016-06-06 MED ORDER — IPRATROPIUM-ALBUTEROL 0.5-2.5 (3) MG/3ML IN SOLN
3.0000 mL | Freq: Once | RESPIRATORY_TRACT | Status: AC
  Administered 2016-06-06: 3 mL via RESPIRATORY_TRACT
  Filled 2016-06-06: qty 3

## 2016-06-06 MED ORDER — DOXYCYCLINE HYCLATE 50 MG PO CAPS: 100.0000 mg | ORAL_CAPSULE | Freq: Two times a day (BID) | ORAL | 0 refills | Status: DC

## 2016-06-06 NOTE — ED Triage Notes (Signed)
Pt comes into the ED via EMS from home c/o chest pain and a productive cough.  Patient's pain is on the left side and radiates to the back.  Unremarkable EKG and stable VS.  Chest hurts to palpation and gets worse with inspiration.  Patient explains that her productive cough is white/yellow.

## 2016-06-06 NOTE — Discharge Instructions (Signed)
You were evaluated for cough, shortness of breath and sputum production and your exam and evaluation are reassuring here in the emergency department. You're being treated for COPD exacerbation/bronchitis with prednisone and antibiotic called doxycycline.  Return to the emergency department for any worsening condition including fever, trouble breathing, chest pain, or any other symptoms concerning to you.

## 2016-06-06 NOTE — ED Provider Notes (Signed)
[facility] Emergency Department Provider Note ____________________________________________   I have reviewed the triage vital signs and the triage nursing note.  HISTORY  Chief Complaint Chest Pain   Historian Patient  HPI Tracy Dickerson is a 75 y.o. female with a history of right sided lung cancer, copd, here for cough with white and yellow sputum for about 1 week now.  Was seen to have inr checked on Friday and was 2.5.  Denies leg pain or swelling.  Pain in chest worse with cough.  She does use inhalers at home for COPD. She states that she quit smoking in 2015.  Symptoms are moderate.  She lives with her brother.  She walks with a cane or a walker as needed.  Does not use home o2.    Past Medical History:  Diagnosis Date  . Cancer ([place])    Breast and lung  . GERD (gastroesophageal reflux disease)   . Heart disease   . Hypertension     There are no active problems to display for this patient.   Past Surgical History:  Procedure Laterality Date  . ABDOMINAL HYSTERECTOMY    . APPENDECTOMY    . BREAST SURGERY     breast cancer  . TONSILLECTOMY      Prior to Admission medications   Medication Sig Start Date End Date Taking? Authorizing Provider  diazepam (VALIUM) 5 MG tablet Take 1 tablet (5 mg total) by mouth every 8 (eight) hours as needed for muscle spasms. 06/08/15   Carrie Mew, MD  diphenhydrAMINE (BENADRYL) 25 mg capsule Take 2 capsules (50 mg total) by mouth every 6 (six) hours as needed. 06/08/15   Carrie Mew, MD  doxycycline (VIBRAMYCIN) 50 MG capsule Take 2 capsules (100 mg total) by mouth 2 (two) times daily. 06/06/16   Lisa Roca, MD  metFORMIN (GLUCOPHAGE) 500 MG tablet Take 1 tablet (500 mg total) by mouth 2 (two) times daily. 02/06/15 02/06/16  Harvest Dark, MD  metoCLOPramide (REGLAN) 10 MG tablet Take 1 tablet (10 mg total) by mouth 4 (four) times daily -  before meals and at bedtime. 06/08/15   Carrie Mew, MD   naproxen (NAPROSYN) 500 MG tablet Take 1 tablet (500 mg total) by mouth 2 (two) times daily with a meal. 06/08/15   Carrie Mew, MD  predniSONE (DELTASONE) 10 MG tablet Take 1 tablet (10 mg total) by mouth daily. 06/06/16   Lisa Roca, MD  traMADol (ULTRAM) 50 MG tablet Take 1 tablet (50 mg total) by mouth every 6 (six) hours as needed. 02/08/16 02/07/17  Schuyler Amor, MD    Allergies  Allergen Reactions  . Percocet [Oxycodone-Acetaminophen]   . Penicillins Rash    Family History  Problem Relation Age of Onset  . Cancer Mother     Social History Social History  Substance Use Topics  . Smoking status: Former Research scientist (life sciences)  . Smokeless tobacco: Never Used  . Alcohol use Yes    Review of Systems  Constitutional: Negative for fever. Eyes: Negative for visual changes. ENT: Negative for sore throat. Cardiovascular: Negative for chest painWith coughing. Respiratory: Positive for shortness of breath. Gastrointestinal: Negative for abdominal pain, vomiting and diarrhea. Genitourinary: Negative for dysuria. Musculoskeletal: Negative for back pain. Skin: Negative for rash. Neurological: Negative for headache. 10 point Review of Systems otherwise negative ____________________________________________   PHYSICAL EXAM:  VITAL SIGNS: ED Triage Vitals  Enc Vitals Group     BP 06/06/16 1212 108/63     Pulse Rate  06/06/16 1212 88     Resp 06/06/16 1212 16     Temp 06/06/16 1212 97.8 F (36.6 C)     Temp Source 06/06/16 1212 Oral     SpO2 06/06/16 1212 97 %     Weight 06/06/16 1207 253 lb (114.8 kg)     Height 06/06/16 1207 '5\' 6"'$  (1.676 m)     Head Circumference --      Peak Flow --      Pain Score 06/06/16 1208 9     Pain Loc --      Pain Edu? --      Excl. in [place]? --      Constitutional: Alert and oriented. Well appearing and in no distress. HEENT   Head: Normocephalic and atraumatic.      Eyes: Conjunctivae are normal. PERRL. Normal extraocular movements.       Ears:         Nose: No congestion/rhinnorhea.   Mouth/Throat: Mucous membranes are moist.   Neck: No stridor. Cardiovascular/Chest: Normal rate, regular rhythm.  No murmurs, rubs, or gallops. Respiratory: Normal respiratory effort without tachypnea nor retractions. Mild end expiratory wheeze in all fields. Gastrointestinal: Soft. No distention, no guarding, no rebound. Nontender.    Genitourinary/rectal:Deferred Musculoskeletal: Nontender with normal range of motion in all extremities. No joint effusions.  No lower extremity tenderness.  No edema. Neurologic:  Normal speech and language. No gross or focal neurologic deficits are appreciated. Skin:  Skin is warm, dry and intact. No rash noted. Psychiatric: Mood and affect are normal. Speech and behavior are normal. Patient exhibits appropriate insight and judgment.   ____________________________________________  LABS (pertinent positives/negatives)  Labs Reviewed  BASIC METABOLIC PANEL - Abnormal; Notable for the following:       Result Value   Glucose, Bld 105 (*)    Creatinine, Ser 1.03 (*)    GFR calc non Af Amer 53 (*)    All other components within normal limits  CBC - Abnormal; Notable for the following:    WBC 13.9 (*)    RBC 3.65 (*)    Hemoglobin 8.1 (*)    HCT 25.1 (*)    MCV 68.8 (*)    MCH 22.2 (*)    RDW 18.7 (*)    All other components within normal limits  TROPONIN I    ____________________________________________    EKG I, Lisa Roca, MD, the attending physician have personally viewed and interpreted all ECGs.  82 bpm. Normal sinus rhythm. Nonspecific intraventricular conduction delay. Normal axis. Normal ST and T-wave ____________________________________________  RADIOLOGY All Xrays were viewed by me. Imaging interpreted by Radiologist.  Chest two-view: No acute cardio pulmonary disease. __________________________________________  PROCEDURES  Procedure(s) performed: None  Critical Care  performed: None  ____________________________________________   ED COURSE / ASSESSMENT AND PLAN  Pertinent labs & imaging results that were available during my care of the patient were reviewed by me and considered in my medical decision making (see chart for details).    Ms. Eaglin is here for cough and congestion with sputum production and shortness of breath for the past week. She is not febrile or hypoxic, but she does have an expiratory wheezing. I think her symptoms are consistent with COPD exacerbation/bronchitis. I am going to treat her with prednisone burst, she has albuterol at home, and doxycycline course. Her chest x-ray is clear. Her labs are reassuring. She states her INR was checked on Friday. At that point was therapeutic. I don't have a high  suspicion for pulmonary embolism.    CONSULTATIONS:   None   Patient / Family / Caregiver informed of clinical course, medical decision-making process, and agree with plan.   I discussed return precautions, follow-up instructions, and discharge instructions with patient and/or family.   ___________________________________________   FINAL CLINICAL IMPRESSION(S) / ED DIAGNOSES   Final diagnoses:  COPD exacerbation ([place])  Bronchitis              Note: This dictation was prepared with Dragon dictation. Any transcriptional errors that result from this process are unintentional    Lisa Roca, MD 06/06/16 1419

## 2016-10-31 ENCOUNTER — Emergency Department
Admission: EM | Admit: 2016-10-31 | Discharge: 2016-10-31 | Disposition: A | Discharge status: Home / Self Care | Payer: Medicare Other | Attending: Emergency Medicine | Admitting: Emergency Medicine

## 2016-10-31 ENCOUNTER — Encounter: Payer: Self-pay | Admitting: Emergency Medicine

## 2016-10-31 ENCOUNTER — Emergency Department: Payer: Medicare Other

## 2016-10-31 DIAGNOSIS — R05 Cough: Secondary | ICD-10-CM | POA: Insufficient documentation

## 2016-10-31 DIAGNOSIS — I10 Essential (primary) hypertension: Secondary | ICD-10-CM | POA: Insufficient documentation

## 2016-10-31 DIAGNOSIS — S6991XA Unspecified injury of right wrist, hand and finger(s), initial encounter: Secondary | ICD-10-CM | POA: Diagnosis present

## 2016-10-31 DIAGNOSIS — S60221A Contusion of right hand, initial encounter: Secondary | ICD-10-CM | POA: Diagnosis not present

## 2016-10-31 DIAGNOSIS — Z87891 Personal history of nicotine dependence: Secondary | ICD-10-CM | POA: Insufficient documentation

## 2016-10-31 DIAGNOSIS — Z23 Encounter for immunization: Secondary | ICD-10-CM | POA: Diagnosis not present

## 2016-10-31 DIAGNOSIS — W540XXA Bitten by dog, initial encounter: Secondary | ICD-10-CM | POA: Diagnosis not present

## 2016-10-31 DIAGNOSIS — Z85118 Personal history of other malignant neoplasm of bronchus and lung: Secondary | ICD-10-CM | POA: Diagnosis not present

## 2016-10-31 DIAGNOSIS — Y929 Unspecified place or not applicable: Secondary | ICD-10-CM | POA: Insufficient documentation

## 2016-10-31 DIAGNOSIS — Z853 Personal history of malignant neoplasm of breast: Secondary | ICD-10-CM | POA: Insufficient documentation

## 2016-10-31 DIAGNOSIS — Y999 Unspecified external cause status: Secondary | ICD-10-CM | POA: Diagnosis not present

## 2016-10-31 DIAGNOSIS — Y939 Activity, unspecified: Secondary | ICD-10-CM | POA: Diagnosis not present

## 2016-10-31 MED ORDER — TETANUS-DIPHTHERIA TOXOIDS TD 5-2 LFU IM INJ: 0.5000 mL | INJECTION | Freq: Once | INTRAMUSCULAR | Status: DC

## 2016-10-31 MED ORDER — TETANUS-DIPHTH-ACELL PERTUSSIS 5-2.5-18.5 LF-MCG/0.5 IM SUSP: 0.5000 mL | Freq: Once | INTRAMUSCULAR | Status: DC

## 2016-10-31 MED ORDER — TETANUS-DIPHTH-ACELL PERTUSSIS 5-2.5-18.5 LF-MCG/0.5 IM SUSP
INTRAMUSCULAR | Status: AC
  Administered 2016-10-31: 0.5 mL via INTRAMUSCULAR
  Filled 2016-10-31: qty 0.5

## 2016-10-31 NOTE — ED Provider Notes (Signed)
[facility] Emergency Department Provider Note  ____________________________________________  Time seen: Approximately 10:38 AM  I have reviewed the triage vital signs and the nursing notes.   HISTORY  Chief Complaint Hand Pain    HPI Tracy Dickerson is a 76 y.o. female that presents to the emergency department with right hand pain after a chihuauwa bit her 4 days ago. Patient was concerned for broken bone so she took ambulance to ER. Bite did not break skin. No additional injuries. Patient does not remember last tetanus shot. Patient has had a non productive cough for 2 weeks. Patient had cold symptoms last week, which have resolved but the cough has lingered. Patient denies fever, shortness of breath, chest pain, nausea, vomiting, abdominal pain. Patient takes warfarin.   Past Medical History:  Diagnosis Date  . Cancer ([place])    Breast and lung  . GERD (gastroesophageal reflux disease)   . Heart disease   . Hypertension     There are no active problems to display for this patient.   Past Surgical History:  Procedure Laterality Date  . ABDOMINAL HYSTERECTOMY    . APPENDECTOMY    . BREAST SURGERY     breast cancer  . TONSILLECTOMY      Prior to Admission medications   Medication Sig Start Date End Date Taking? Authorizing Provider  diazepam (VALIUM) 5 MG tablet Take 1 tablet (5 mg total) by mouth every 8 (eight) hours as needed for muscle spasms. 06/08/15   Carrie Mew, MD  diphenhydrAMINE (BENADRYL) 25 mg capsule Take 2 capsules (50 mg total) by mouth every 6 (six) hours as needed. 06/08/15   Carrie Mew, MD  doxycycline (VIBRAMYCIN) 50 MG capsule Take 2 capsules (100 mg total) by mouth 2 (two) times daily. 06/06/16   Lisa Roca, MD  metFORMIN (GLUCOPHAGE) 500 MG tablet Take 1 tablet (500 mg total) by mouth 2 (two) times daily. 02/06/15 02/06/16  Harvest Dark, MD  metoCLOPramide (REGLAN) 10 MG tablet Take 1 tablet (10 mg total) by mouth 4  (four) times daily -  before meals and at bedtime. 06/08/15   Carrie Mew, MD  naproxen (NAPROSYN) 500 MG tablet Take 1 tablet (500 mg total) by mouth 2 (two) times daily with a meal. 06/08/15   Carrie Mew, MD  predniSONE (DELTASONE) 10 MG tablet Take 1 tablet (10 mg total) by mouth daily. 06/06/16   Lisa Roca, MD  traMADol (ULTRAM) 50 MG tablet Take 1 tablet (50 mg total) by mouth every 6 (six) hours as needed. 02/08/16 02/07/17  Schuyler Amor, MD    Allergies Percocet [oxycodone-acetaminophen] and Penicillins  Family History  Problem Relation Age of Onset  . Cancer Mother     Social History Social History  Substance Use Topics  . Smoking status: Former Research scientist (life sciences)  . Smokeless tobacco: Never Used  . Alcohol use Yes     Review of Systems  Constitutional: No fever/chills ENT: No upper respiratory complaints. Cardiovascular: No chest pain. Respiratory: No SOB. Gastrointestinal: No abdominal pain.  No nausea, no vomiting.  Skin: Negative for rash, abrasions, lacerations. Neurological: Negative for headaches, numbness or tingling   ____________________________________________   PHYSICAL EXAM:  VITAL SIGNS: ED Triage Vitals  Enc Vitals Group     BP 10/31/16 0950 (!) 115/59     Pulse Rate 10/31/16 0950 (!) 107     Resp 10/31/16 0950 18     Temp 10/31/16 0950 97.7 F (36.5 C)     Temp Source 10/31/16  0950 Oral     SpO2 10/31/16 0950 98 %     Weight 10/31/16 0951 251 lb (113.9 kg)     Height 10/31/16 0951 '5\' 6"'$  (1.676 m)     Head Circumference --      Peak Flow --      Pain Score 10/31/16 0951 4     Pain Loc --      Pain Edu? --      Excl. in [place]? --      Constitutional: Alert and oriented. Well appearing and in no acute distress. Eyes: Conjunctivae are normal. PERRL. EOMI. Head: Atraumatic. ENT:      Ears:      Nose: No congestion/rhinnorhea.      Mouth/Throat: Mucous membranes are moist.  Neck: No stridor.   Cardiovascular: Normal rate, regular  rhythm.  Good peripheral circulation. 2+ radial pulses. Respiratory: Normal respiratory effort without tachypnea or retractions. Lungs CTAB. Good air entry to the bases with no decreased or absent breath sounds. Musculoskeletal: Full range of motion to all extremities. No gross deformities appreciated. Neurologic:  Normal speech and language. No gross focal neurologic deficits are appreciated.  Skin:  Skin is warm, dry and intact. 1 cm circular bruise over right hand at base of 1st metacarpal. Tender to palpation. Psychiatric: Mood and affect are normal. Speech and behavior are normal. Patient exhibits appropriate insight and judgement.   ____________________________________________   LABS (all labs ordered are listed, but only abnormal results are displayed)  Labs Reviewed - No data to display ____________________________________________  EKG   ____________________________________________  RADIOLOGY Robinette Haines, personally viewed and evaluated these images (plain radiographs) as part of my medical decision making, as well as reviewing the written report by the radiologist.  Dg Chest 2 View  Result Date: 10/31/2016 CLINICAL DATA:  Productive cough EXAM: CHEST  2 VIEW COMPARISON:  06/06/2016 FINDINGS: Mild cardiomegaly. Negative mediastinal contours. Chronic interstitial coarsening asymmetric to the right subpleural chest. There is postoperative changes at the right base. Postoperative chest wall. IMPRESSION: Chronic lung disease.  No acute superimposed finding. Electronically Signed   By: Monte Fantasia M.D.   On: 10/31/2016 11:02   Dg Hand Complete Right  Result Date: 10/31/2016 CLINICAL DATA:  Dog bite of the right hand on Monday with bruising posteriorly and pain over the metacarpals. EXAM: RIGHT HAND - COMPLETE 3+ VIEW COMPARISON:  None in PACs FINDINGS: The bones are subjectively osteopenic. There is no acute fracture nor dislocation. There is mild joint space narrowing of the  interphalangeal joints and of the first and second MCP joints. There is moderate narrowing of the first CMC joint. No soft tissue foreign bodies are observed. There may be mild soft tissue swelling over the base bases of the fingers. IMPRESSION: No acute bony or abnormality of the right hand. Mild osteoarthritic changes as described. There may be mild soft tissue swelling over the bases of the second through fifth phalanges. Electronically Signed   By: David  Martinique M.D.   On: 10/31/2016 11:02    ____________________________________________    PROCEDURES  Procedure(s) performed:    Procedures    Medications  Tdap (BOOSTRIX) 5-2.5-18.5 LF-MCG/0.5 injection (0.5 mLs Intramuscular Given 10/31/16 1055)     ____________________________________________   INITIAL IMPRESSION / ASSESSMENT AND PLAN / ED COURSE  Pertinent labs & imaging results that were available during my care of the patient were reviewed by me and considered in my medical decision making (see chart for details).  Review of the  [place] CSRS was performed in accordance of the [place] prior to dispensing any controlled drugs.     Patient's diagnosis is consistent with hand contusion and bronchitis. Vital signs and exam are reassuring. Right hand x-ray negative for any acute bony abnormalities. Tetanus shot was updated. Chest x-ray negative for acute cardiopulmonary processes. Patient has diabetes so I do not want to give steroids for viral bronchitis. No concern for pneumonia. Lungs clear to auscultation bilaterally. Patient is going to follow up with PCP on Tuesday.  Patient is given ED precautions to return to the ED for any worsening or new symptoms.  ____________________________________________  FINAL CLINICAL IMPRESSION(S) / ED DIAGNOSES  Final diagnoses:  Contusion of right hand, initial encounter      NEW MEDICATIONS STARTED DURING THIS VISIT:  Discharge Medication List as of 10/31/2016 12:01 PM          This  chart was dictated using voice recognition software/Dragon. Despite best efforts to proofread, errors can occur which can change the meaning. Any change was purely unintentional.    Laban Emperor, PA-C 10/31/16 India Hook, MD 11/01/16 [phone]

## 2016-10-31 NOTE — ED Triage Notes (Signed)
Pt with painful right hand from dog bite on Monday. Did not break the skin. Bruising noted. NAD. CMS.

## 2016-10-31 NOTE — ED Notes (Signed)
Pt arrives to ER via [facility] from home. Pt bit by dog on right  hand Monday. PT did not receive medical treatment at that time. VSS per [facility]. Pt was bitten by neighbors dog. Bruising present to right hand. No swelling or spreading redness.

## 2017-01-26 ENCOUNTER — Encounter: Payer: Self-pay | Admitting: Emergency Medicine

## 2017-01-26 ENCOUNTER — Other Ambulatory Visit: Payer: Self-pay

## 2017-01-26 ENCOUNTER — Emergency Department: Payer: Medicare Other

## 2017-01-26 ENCOUNTER — Observation Stay
Admission: EM | Admit: 2017-01-26 | Discharge: 2017-01-27 | Disposition: A | Discharge status: Home / Self Care | Payer: Medicare Other | Attending: Internal Medicine | Admitting: Internal Medicine

## 2017-01-26 DIAGNOSIS — Z7951 Long term (current) use of inhaled steroids: Secondary | ICD-10-CM | POA: Insufficient documentation

## 2017-01-26 DIAGNOSIS — Z85118 Personal history of other malignant neoplasm of bronchus and lung: Secondary | ICD-10-CM | POA: Insufficient documentation

## 2017-01-26 DIAGNOSIS — K219 Gastro-esophageal reflux disease without esophagitis: Secondary | ICD-10-CM | POA: Insufficient documentation

## 2017-01-26 DIAGNOSIS — Z853 Personal history of malignant neoplasm of breast: Secondary | ICD-10-CM | POA: Insufficient documentation

## 2017-01-26 DIAGNOSIS — Z79899 Other long term (current) drug therapy: Secondary | ICD-10-CM | POA: Insufficient documentation

## 2017-01-26 DIAGNOSIS — I482 Chronic atrial fibrillation: Secondary | ICD-10-CM | POA: Diagnosis not present

## 2017-01-26 DIAGNOSIS — J849 Interstitial pulmonary disease, unspecified: Secondary | ICD-10-CM | POA: Insufficient documentation

## 2017-01-26 DIAGNOSIS — E1140 Type 2 diabetes mellitus with diabetic neuropathy, unspecified: Secondary | ICD-10-CM | POA: Insufficient documentation

## 2017-01-26 DIAGNOSIS — I951 Orthostatic hypotension: Secondary | ICD-10-CM | POA: Diagnosis present

## 2017-01-26 DIAGNOSIS — I119 Hypertensive heart disease without heart failure: Secondary | ICD-10-CM | POA: Diagnosis not present

## 2017-01-26 DIAGNOSIS — G2581 Restless legs syndrome: Secondary | ICD-10-CM | POA: Diagnosis not present

## 2017-01-26 DIAGNOSIS — I959 Hypotension, unspecified: Secondary | ICD-10-CM | POA: Diagnosis present

## 2017-01-26 DIAGNOSIS — I952 Hypotension due to drugs: Secondary | ICD-10-CM | POA: Diagnosis not present

## 2017-01-26 DIAGNOSIS — Z7901 Long term (current) use of anticoagulants: Secondary | ICD-10-CM | POA: Diagnosis not present

## 2017-01-26 DIAGNOSIS — Z880 Allergy status to penicillin: Secondary | ICD-10-CM | POA: Diagnosis not present

## 2017-01-26 DIAGNOSIS — Z7984 Long term (current) use of oral hypoglycemic drugs: Secondary | ICD-10-CM | POA: Diagnosis not present

## 2017-01-26 DIAGNOSIS — Z885 Allergy status to narcotic agent status: Secondary | ICD-10-CM | POA: Diagnosis not present

## 2017-01-26 DIAGNOSIS — Z23 Encounter for immunization: Secondary | ICD-10-CM | POA: Insufficient documentation

## 2017-01-26 DIAGNOSIS — Z87891 Personal history of nicotine dependence: Secondary | ICD-10-CM | POA: Insufficient documentation

## 2017-01-26 DIAGNOSIS — E860 Dehydration: Secondary | ICD-10-CM | POA: Diagnosis not present

## 2017-01-26 LAB — PROTIME-INR
INR: 2.36
Prothrombin Time: 26.2 seconds — ABNORMAL HIGH (ref 11.4–15.2)

## 2017-01-26 LAB — URINALYSIS, COMPLETE (UACMP) WITH MICROSCOPIC
Bilirubin Urine: NEGATIVE
Glucose, UA: NEGATIVE mg/dL
Hgb urine dipstick: NEGATIVE
Ketones, ur: NEGATIVE mg/dL
Leukocytes, UA: NEGATIVE
Nitrite: NEGATIVE
Protein, ur: NEGATIVE mg/dL
Specific Gravity, Urine: 1.005 (ref 1.005–1.030)
pH: 6 (ref 5.0–8.0)

## 2017-01-26 LAB — LACTIC ACID, PLASMA: Lactic Acid, Venous: 1.6 mmol/L (ref 0.5–1.9)

## 2017-01-26 LAB — COMPREHENSIVE METABOLIC PANEL
ALT: 15 U/L (ref 14–54)
AST: 23 U/L (ref 15–41)
Albumin: 3.3 g/dL — ABNORMAL LOW (ref 3.5–5.0)
Alkaline Phosphatase: 81 U/L (ref 38–126)
Anion gap: 6 (ref 5–15)
BUN: 19 mg/dL (ref 6–20)
CO2: 25 mmol/L (ref 22–32)
Calcium: 8.7 mg/dL — ABNORMAL LOW (ref 8.9–10.3)
Chloride: 106 mmol/L (ref 101–111)
Creatinine, Ser: 1.13 mg/dL — ABNORMAL HIGH (ref 0.44–1.00)
GFR calc Af Amer: 54 mL/min — ABNORMAL LOW (ref >60)
GFR calc non Af Amer: 47 mL/min — ABNORMAL LOW (ref >60)
Glucose, Bld: 121 mg/dL — ABNORMAL HIGH (ref 65–99)
Potassium: 4.3 mmol/L (ref 3.5–5.1)
Sodium: 137 mmol/L (ref 135–145)
Total Bilirubin: 0.4 mg/dL (ref 0.3–1.2)
Total Protein: 7.1 g/dL (ref 6.5–8.1)

## 2017-01-26 LAB — CBC
HCT: 26.2 % — ABNORMAL LOW (ref 35.0–47.0)
Hemoglobin: 8.5 g/dL — ABNORMAL LOW (ref 12.0–16.0)
MCH: 23 pg — ABNORMAL LOW (ref 26.0–34.0)
MCHC: 32.3 g/dL (ref 32.0–36.0)
MCV: 71.1 fL — ABNORMAL LOW (ref 80.0–100.0)
Platelets: 284 10*3/uL (ref 150–440)
RBC: 3.68 MIL/uL — ABNORMAL LOW (ref 3.80–5.20)
RDW: 20 % — ABNORMAL HIGH (ref 11.5–14.5)
WBC: 10.3 10*3/uL (ref 3.6–11.0)

## 2017-01-26 LAB — TROPONIN I: Troponin I: <0.03 ng/mL (ref <0.03)

## 2017-01-26 MED ORDER — ACETAMINOPHEN 650 MG RE SUPP: 650.0000 mg | Freq: Four times a day (QID) | RECTAL | Status: DC | PRN

## 2017-01-26 MED ORDER — ONDANSETRON HCL 4 MG/2ML IJ SOLN: 4.0000 mg | Freq: Four times a day (QID) | INTRAMUSCULAR | Status: DC | PRN

## 2017-01-26 MED ORDER — PRAMIPEXOLE DIHYDROCHLORIDE 0.25 MG PO TABS
0.2500 mg | ORAL_TABLET | Freq: Every day | ORAL | Status: DC
  Administered 2017-01-27: 0.25 mg via ORAL
  Filled 2017-01-26 (×2): qty 1

## 2017-01-26 MED ORDER — WARFARIN SODIUM 5 MG PO TABS: 5.0000 mg | ORAL_TABLET | ORAL | Status: DC

## 2017-01-26 MED ORDER — WARFARIN SODIUM 2.5 MG PO TABS: 3.7500 mg | ORAL_TABLET | ORAL | Status: DC

## 2017-01-26 MED ORDER — WARFARIN SODIUM 2.5 MG PO TABS: 3.7500 mg | ORAL_TABLET | Freq: Every day | ORAL | Status: DC

## 2017-01-26 MED ORDER — MOMETASONE FURO-FORMOTEROL FUM 200-5 MCG/ACT IN AERO
2.0000 | INHALATION_SPRAY | Freq: Two times a day (BID) | RESPIRATORY_TRACT | Status: DC
  Administered 2017-01-27 (×2): 2 via RESPIRATORY_TRACT
  Filled 2017-01-26: qty 8.8

## 2017-01-26 MED ORDER — ACETAMINOPHEN 325 MG PO TABS
650.0000 mg | ORAL_TABLET | Freq: Once | ORAL | Status: AC
  Administered 2017-01-26: 650 mg via ORAL
  Filled 2017-01-26: qty 2

## 2017-01-26 MED ORDER — GABAPENTIN 300 MG PO CAPS: 300.0000 mg | ORAL_CAPSULE | Freq: Every day | ORAL | Status: DC

## 2017-01-26 MED ORDER — ACETAMINOPHEN 325 MG PO TABS: 650.0000 mg | ORAL_TABLET | Freq: Four times a day (QID) | ORAL | Status: DC | PRN

## 2017-01-26 MED ORDER — SODIUM CHLORIDE 0.9 % IV BOLUS (SEPSIS)
1000.0000 mL | Freq: Once | INTRAVENOUS | Status: AC
  Administered 2017-01-26: 1000 mL via INTRAVENOUS

## 2017-01-26 MED ORDER — PANTOPRAZOLE SODIUM 40 MG PO TBEC
40.0000 mg | DELAYED_RELEASE_TABLET | Freq: Every day | ORAL | Status: DC
  Administered 2017-01-27: 40 mg via ORAL
  Filled 2017-01-26: qty 1

## 2017-01-26 MED ORDER — SODIUM CHLORIDE 0.9 % IV SOLN
INTRAVENOUS | Status: DC
  Administered 2017-01-27: via INTRAVENOUS

## 2017-01-26 MED ORDER — METFORMIN HCL 500 MG PO TABS
500.0000 mg | ORAL_TABLET | Freq: Two times a day (BID) | ORAL | Status: DC
  Administered 2017-01-27: 09:00:00 500 mg via ORAL
  Filled 2017-01-26: qty 1

## 2017-01-26 MED ORDER — ONDANSETRON HCL 4 MG PO TABS: 4.0000 mg | ORAL_TABLET | Freq: Four times a day (QID) | ORAL | Status: DC | PRN

## 2017-01-26 NOTE — ED Notes (Signed)
Pt given meal tray and drinks upon request

## 2017-01-26 NOTE — ED Triage Notes (Signed)
Patient arrives from home via [facility] with complaint of dizziness and pre syncope. Patient states that this has been going on for two weeks, worsening today.

## 2017-01-26 NOTE — ED Notes (Signed)
Pt reports taking 2 x 2.'5mg'$  Warfarin, and 1 x '300mg'$  gabapentin whilst in ED tonight

## 2017-01-26 NOTE — ED Provider Notes (Signed)
[facility] Emergency Department Provider Note  ____________________________________________   First MD Initiated Contact with Patient 01/26/17 1508     (approximate)  I have reviewed the triage vital signs and the nursing notes.   HISTORY  Chief Complaint Dizziness   HPI Tracy Dickerson is a 76 y.o. 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 She says that there is no ringing or pressure in her ears. She says that when she turns her head from side-to-side when sitting she is asymptomatic. She says that she does not drink much water and has not eaten anything since last night. Said that the symptoms worsened today which is out walking and looking to buy tomato plants. She felt like she was going to pass out. However, there was no chest pain. No shortness of breath. However, she does note 2 weeks of cough with runny nose. Denies any burning or frequency with her urination. Denies any black, tarry or maroon stools. Says that she coughed up about a dime size clot of blood this past Thursday and has not had any hemoptysis since. Patient denies any recent medication changes including any increase in doses of her blood pressure medications or new blood pressure medications.   Past Medical History:  Diagnosis Date  . Cancer ([place])    Breast and lung  . GERD (gastroesophageal reflux disease)   . Heart disease   . Hypertension     There are no active problems to display for this patient.   Past Surgical History:  Procedure Laterality Date  . ABDOMINAL HYSTERECTOMY    . APPENDECTOMY    . BREAST SURGERY     breast cancer  . TONSILLECTOMY      Prior to Admission medications   Medication Sig Start Date End Date Taking? Authorizing Provider  ADVAIR DISKUS 250-50 MCG/DOSE AEPB  Inhale 1 puff into the lungs 2 (two) times daily. 12/14/16  Yes [provider]  benazepril (LOTENSIN) 20 MG tablet Take 20 mg by mouth daily. 11/12/16  Yes [provider]  gabapentin (NEURONTIN) 300 MG capsule Take 300 mg by mouth at bedtime. 12/15/16  Yes [provider]  metoprolol tartrate (LOPRESSOR) 25 MG tablet Take 25 mg by mouth 2 (two) times daily. 11/13/16  Yes [provider]  omeprazole (PRILOSEC) 40 MG capsule Take 40 mg by mouth daily. 01/15/17  Yes [provider]  PAZEO 0.7 % SOLN Place 1 drop into both eyes daily. 01/16/17  Yes [provider]  pramipexole (MIRAPEX) 0.25 MG tablet Take 0.25 mg by mouth at bedtime. 12/23/16  Yes [provider]  simvastatin (ZOCOR) 40 MG tablet Take 40 mg by mouth daily. 01/21/17  Yes [provider]  triamterene-hydrochlorothiazide (MAXZIDE-25) 37.5-25 MG tablet Take 1 tablet by mouth daily. 12/28/16  Yes [provider]  warfarin (COUMADIN) 2.5 MG tablet Take 3.75-5 mg by mouth daily. Take 3.75 mg (1.5 tablet) on Thursday and 5 mg on all other days as directed. 01/21/17  Yes [provider]  diazepam (VALIUM) 5 MG tablet Take 1 tablet (5 mg total) by mouth every 8 (eight) hours as needed for muscle spasms. 06/08/15   Carrie Mew, MD  diphenhydrAMINE (BENADRYL) 25 mg capsule Take 2 capsules (50 mg total) by mouth every 6 (six) hours as needed. 06/08/15  Carrie Mew, MD  doxycycline (VIBRAMYCIN) 50 MG capsule Take 2 capsules (100 mg total) by mouth 2 (two) times daily. 06/06/16   Lisa Roca, MD  metFORMIN (GLUCOPHAGE) 500 MG tablet Take 1 tablet (500 mg total) by mouth 2 (two) times daily. 02/06/15 02/06/16  Harvest Dark, MD  metoCLOPramide (REGLAN) 10 MG tablet Take 1 tablet (10 mg total) by mouth 4 (four) times daily -  before meals and at bedtime. 06/08/15   Carrie Mew, MD  naproxen (NAPROSYN) 500 MG tablet Take 1 tablet (500 mg total) by mouth 2  (two) times daily with a meal. 06/08/15   Carrie Mew, MD  predniSONE (DELTASONE) 10 MG tablet Take 1 tablet (10 mg total) by mouth daily. 06/06/16   Lisa Roca, MD  traMADol (ULTRAM) 50 MG tablet Take 1 tablet (50 mg total) by mouth every 6 (six) hours as needed. 02/08/16 02/07/17  Schuyler Amor, MD    Allergies Percocet [oxycodone-acetaminophen] and Penicillins  Family History  Problem Relation Age of Onset  . Cancer Mother     Social History Social History  Substance Use Topics  . Smoking status: Former Research scientist (life sciences)  . Smokeless tobacco: Never Used  . Alcohol use Yes    Review of Systems  Constitutional: No fever/chills Eyes: No visual changes. ENT: No sore throat. Cardiovascular: Denies chest pain. Respiratory: Denies shortness of breath. Gastrointestinal: No abdominal pain.  No nausea, no vomiting.  No diarrhea.  No constipation. Genitourinary: Negative for dysuria. Musculoskeletal: Negative for back pain. Skin: Negative for rash. Neurological: Negative for headaches, focal weakness or numbness.   ____________________________________________   PHYSICAL EXAM:  VITAL SIGNS: ED Triage Vitals  Enc Vitals Group     BP 01/26/17 1408 (!) 81/48     Pulse Rate 01/26/17 1408 83     Resp 01/26/17 1408 19     Temp 01/26/17 1423 97.7 F (36.5 C)     Temp Source 01/26/17 1423 Oral     SpO2 01/26/17 1408 95 %     Weight 01/26/17 1424 250 lb (113.4 kg)     Height 01/26/17 1424 '5\' 6"'$  (1.676 m)     Head Circumference --      Peak Flow --      Pain Score --      Pain Loc --      Pain Edu? --      Excl. in [place]? --     Constitutional: Alert and oriented. Well appearing and in no acute distress. Eyes: Conjunctivae are normal. PERRL. EOMI.No nystagmus. Head: Atraumatic. Normal TMs bilaterally. Nose: No congestion/rhinnorhea. Mouth/Throat: Mucous membranes are moist.   Neck: No stridor.   Cardiovascular: Normal rate, regular rhythm. Grossly normal heart sounds.     Respiratory: Normal respiratory effort.  No retractions. Lungs CTAB. Gastrointestinal: Soft and nontender. No distention Musculoskeletal: No lower extremity tenderness nor edema.  No joint effusions. Neurologic:  Normal speech and language. No gross focal neurologic deficits are appreciated. Skin:  Skin is warm, dry and intact. No rash noted. Psychiatric: Mood and affect are normal. Speech and behavior are normal.  ____________________________________________   LABS (all labs ordered are listed, but only abnormal results are displayed)  Labs Reviewed  CBC - Abnormal; Notable for the following:       Result Value   RBC 3.68 (*)    Hemoglobin 8.5 (*)    HCT 26.2 (*)    MCV 71.1 (*)    MCH 23.0 (*)    RDW 20.0 (*)  All other components within normal limits  URINALYSIS, COMPLETE (UACMP) WITH MICROSCOPIC - Abnormal; Notable for the following:    Color, Urine STRAW (*)    APPearance CLEAR (*)    Bacteria, UA FEW (*)    Squamous Epithelial / LPF 0-5 (*)    All other components within normal limits  PROTIME-INR - Abnormal; Notable for the following:    Prothrombin Time 26.2 (*)    All other components within normal limits  COMPREHENSIVE METABOLIC PANEL - Abnormal; Notable for the following:    Glucose, Bld 121 (*)    Creatinine, Ser 1.13 (*)    Calcium 8.7 (*)    Albumin 3.3 (*)    GFR calc non Af Amer 47 (*)    GFR calc Af Amer 54 (*)    All other components within normal limits  TROPONIN I  LACTIC ACID, PLASMA  LACTIC ACID, PLASMA  CBG MONITORING, ED   ____________________________________________  EKG  ED ECG REPORT I, Doran Stabler, the attending physician, personally viewed and interpreted this ECG.   Date: 01/26/2017  EKG Time: 1409  Rate: 77  Rhythm: normal sinus rhythm  Axis: normal  Intervals:none  ST&T Change: No ST segment elevation or depression. No abnormal T-wave inversion.  ____________________________________________  [id number]  DG Chest  1 View (Final result)  Result time 01/26/17 17:04:40  Final result by Richardean Sale, MD (01/26/17 17:04:40)           Narrative:   CLINICAL DATA: Dizziness. Presyncope. Symptoms for 2 weeks, worsening today. History of hypertension, breast and lung cancer.  EXAM: CHEST 1 VIEW  COMPARISON: 10/31/2016 and 06/06/2016 radiographs.  FINDINGS: 1645 hours. Stable mild cardiomegaly. The mediastinal contours are normal. Coarse interstitial markings and asymmetric density at the right lung base are chronic and unchanged. There is no superimposed edema, pleural effusion or pneumothorax. No acute osseous findings are seen. There are surgical clips in the left breast and axilla.  IMPRESSION: Stable chest with chronic interstitial lung disease or scarring. No acute findings.   Electronically Signed By: Richardean Sale M.D. On: 01/26/2017 17:04            ____________________________________________   PROCEDURES  Procedure(s) performed:   Procedures  Critical Care performed:   ____________________________________________   INITIAL IMPRESSION / ASSESSMENT AND PLAN / ED COURSE  Pertinent labs & imaging results that were available during my care of the patient were reviewed by me and considered in my medical decision making (see chart for details).  Patient with a blood pressure of 97 systolic in the room.    ----------------------------------------- 7:48 PM on 01/26/2017 -----------------------------------------  After 2 L of IV fluids the patient is persistently in the 60V systolic. She was also symptomatic and became dizzy and lightheaded when standing. She'll be admitted to the hospital. Signed out to Dr. Verdell Carmine. Patient is understanding the plan and willing to comply.  ____________________________________________   FINAL CLINICAL IMPRESSION(S) / ED DIAGNOSES  Hypotension.    NEW MEDICATIONS STARTED DURING THIS VISIT:  New Prescriptions   No  medications on file     Note:  This document was prepared using Dragon voice recognition software and may include unintentional dictation errors.    Orbie Pyo, MD 01/26/17 [phone]

## 2017-01-26 NOTE — ED Notes (Signed)
Orthostatics to be repeated on right arm d/t left arm s/p left breast surgery and "30 lymph nodes" removed

## 2017-01-26 NOTE — ED Notes (Signed)
Admitting Provider at bedside. 

## 2017-01-26 NOTE — H&P (Signed)
Tracy Dickerson NAME: Tracy Dickerson    MR#:  [id number]  DATE OF BIRTH:  11-24-1940  DATE OF ADMISSION:  01/26/2017  PRIMARY CARE PHYSICIAN: Boykin Nearing, MD   REQUESTING/REFERRING PHYSICIAN: Dr. Larae Grooms  CHIEF COMPLAINT:   Chief Complaint  Patient presents with  . Dizziness    HISTORY OF PRESENT ILLNESS:  Tracy Dickerson  is a 76 y.o. female with a known history of Breast cancer, lung cancer, GERD, hypertension, diabetes who presents to the hospital due to dizziness. Patient says that she was ambulating with her son while shopping today and became quite dizzy. She also says that she's been feeling dizzy when getting up on a chair or bed for the past 2 days. She did not lose consciousness, denied any chest pain, shortness of breath, fever or chills cough or any other associated symptoms. Patient presented to the emergency room was noted to be hypotensive and given 2 L IV fluids and despite that continued to be mildly hypotensive and therefore hospitalist services were contacted further treatment and evaluation.  PAST MEDICAL HISTORY:   Past Medical History:  Diagnosis Date  . Cancer ([place])    Breast and lung  . GERD (gastroesophageal reflux disease)   . Heart disease   . Hypertension     PAST SURGICAL HISTORY:   Past Surgical History:  Procedure Laterality Date  . ABDOMINAL HYSTERECTOMY    . APPENDECTOMY    . BREAST SURGERY     breast cancer  . TONSILLECTOMY      SOCIAL HISTORY:   Social History  Substance Use Topics  . Smoking status: Former Research scientist (life sciences)  . Smokeless tobacco: Never Used  . Alcohol use Yes    FAMILY HISTORY:   Family History  Problem Relation Age of Onset  . Cancer Mother     DRUG ALLERGIES:   Allergies  Allergen Reactions  . Percocet [Oxycodone-Acetaminophen]   . Penicillins Rash    REVIEW OF SYSTEMS:   Review of Systems  Constitutional: Negative for fever and weight loss.  HENT:  Negative for congestion, nosebleeds and tinnitus.   Eyes: Negative for blurred vision, double vision and redness.  Respiratory: Negative for cough, hemoptysis and shortness of breath.   Cardiovascular: Negative for chest pain, orthopnea, leg swelling and PND.  Gastrointestinal: Negative for abdominal pain, diarrhea, melena, nausea and vomiting.  Genitourinary: Negative for dysuria, hematuria and urgency.  Musculoskeletal: Negative for falls and joint pain.  Neurological: Positive for dizziness. Negative for tingling, sensory change, focal weakness, seizures, weakness and headaches.  Endo/Heme/Allergies: Negative for polydipsia. Does not bruise/bleed easily.  Psychiatric/Behavioral: Negative for depression and memory loss. The patient is not nervous/anxious.     MEDICATIONS AT HOME:   Prior to Admission medications   Medication Sig Start Date End Date Taking? Authorizing Provider  ADVAIR DISKUS 250-50 MCG/DOSE AEPB Inhale 1 puff into the lungs 2 (two) times daily. 12/14/16  Yes [provider]  benazepril (LOTENSIN) 20 MG tablet Take 20 mg by mouth daily. 11/12/16  Yes [provider]  diphenhydrAMINE (BENADRYL) 25 mg capsule Take 2 capsules (50 mg total) by mouth every 6 (six) hours as needed. 06/08/15  Yes Carrie Mew, MD  gabapentin (NEURONTIN) 300 MG capsule Take 300 mg by mouth at bedtime. 12/15/16  Yes [provider]  metFORMIN (GLUCOPHAGE) 500 MG tablet Take 1 tablet (500 mg total) by mouth 2 (two) times daily. 02/06/15 01/26/18 Yes Harvest Dark, MD  metoprolol  tartrate (LOPRESSOR) 25 MG tablet Take 25 mg by mouth 2 (two) times daily. 11/13/16  Yes [provider]  omeprazole (PRILOSEC) 40 MG capsule Take 40 mg by mouth daily. 01/15/17  Yes [provider]  pramipexole (MIRAPEX) 0.25 MG tablet Take 0.25 mg by mouth at bedtime. 12/23/16  Yes [provider]  traMADol (ULTRAM) 50 MG tablet Take 1 tablet (50 mg total) by mouth every 6  (six) hours as needed. 02/08/16 02/07/17 Yes McShaneGerda Diss, MD  triamterene-hydrochlorothiazide (MAXZIDE-25) 37.5-25 MG tablet Take 1 tablet by mouth daily. 12/28/16  Yes [provider]  warfarin (COUMADIN) 2.5 MG tablet Take 3.75-5 mg by mouth daily. Take 3.75 mg (1.5 tablet) on Thursday and 5 mg on all other days as directed. 01/21/17  Yes [provider]  diazepam (VALIUM) 5 MG tablet Take 1 tablet (5 mg total) by mouth every 8 (eight) hours as needed for muscle spasms. Patient not taking: Reported on 01/26/2017 06/08/15   Carrie Mew, MD  metoCLOPramide (REGLAN) 10 MG tablet Take 1 tablet (10 mg total) by mouth 4 (four) times daily -  before meals and at bedtime. Patient not taking: Reported on 01/26/2017 06/08/15   Carrie Mew, MD  naproxen (NAPROSYN) 500 MG tablet Take 1 tablet (500 mg total) by mouth 2 (two) times daily with a meal. Patient not taking: Reported on 01/26/2017 06/08/15   Carrie Mew, MD      VITAL SIGNS:  Blood pressure (!) 103/50, pulse 79, temperature 97.7 F (36.5 C), temperature source Oral, resp. rate 18, height '5\' 6"'$  (1.676 m), weight 113.4 kg (250 lb), SpO2 97 %.  PHYSICAL EXAMINATION:  Physical Exam  GENERAL:  76 y.o.-year-old patient lying in bed in no acute distress.  EYES: Pupils equal, round, reactive to light and accommodation. No scleral icterus. Extraocular muscles intact.  HEENT: Head atraumatic, normocephalic. Oropharynx and nasopharynx clear. No oropharyngeal erythema, moist oral mucosa  NECK:  Supple, no jugular venous distention. No thyroid enlargement, no tenderness.  LUNGS: Normal breath sounds bilaterally, no wheezing, rales, rhonchi. No use of accessory muscles of respiration.  CARDIOVASCULAR: S1, S2 RRR. No murmurs, rubs, gallops, clicks.  ABDOMEN: Soft, nontender, nondistended. Bowel sounds present. No organomegaly or mass.  EXTREMITIES: No pedal edema, cyanosis, or clubbing. + 2 pedal & radial pulses b/l.    NEUROLOGIC: Cranial nerves II through XII are intact. No focal Motor or sensory deficits appreciated b/l PSYCHIATRIC: The patient is alert and oriented x 3.  SKIN: No obvious rash, lesion, or ulcer.   LABORATORY PANEL:   CBC  Recent Labs Lab 01/26/17 1413  WBC 10.3  HGB 8.5*  HCT 26.2*  PLT 284   ------------------------------------------------------------------------------------------------------------------  Chemistries   Recent Labs Lab 01/26/17 1413  NA 137  K 4.3  CL 106  CO2 25  GLUCOSE 121*  BUN 19  CREATININE 1.13*  CALCIUM 8.7*  AST 23  ALT 15  ALKPHOS 81  BILITOT 0.4   ------------------------------------------------------------------------------------------------------------------  Cardiac Enzymes  Recent Labs Lab 01/26/17 1413  TROPONINI <0.03   ------------------------------------------------------------------------------------------------------------------  RADIOLOGY:  Dg Chest 1 View  Result Date: 01/26/2017 CLINICAL DATA:  Dizziness. Presyncope. Symptoms for 2 weeks, worsening today. History of hypertension, breast and lung cancer. EXAM: CHEST 1 VIEW COMPARISON:  10/31/2016 and 06/06/2016 radiographs. FINDINGS: 1645 hours. Stable mild cardiomegaly. The mediastinal contours are normal. Coarse interstitial markings and asymmetric density at the right lung base are chronic and unchanged. There is no superimposed edema, pleural effusion or pneumothorax. No acute  osseous findings are seen. There are surgical clips in the left breast and axilla. IMPRESSION: Stable chest with chronic interstitial lung disease or scarring. No acute findings. Electronically Signed   By: Richardean Sale M.D.   On: 01/26/2017 17:04     IMPRESSION AND PLAN:   76 year old female with past history of lung cancer, breast cancer, hypertension, diabetes, atrial fibrillation who presents to the hospital due to dizziness and noted to be hypotensive.   1. Hypotension-etiology  unclear presently.  No evidence of dehydration, volume loss, no evidence of infection or sepsis. Possibly related to polypharmacy and antihypertensives. -We'll hydrate her with IV fluids, follow hemodynamics. Check orthostatic vital signs in a.m.  -hold all antihypertensives and her medications may need to be adjusted prior to discharge.  2. Diabetes type 2 without complication-continue metformin.  3. Restless leg syndrome-continue pramipexole.  4. GERD-continue Protonix.  5. Neuropathy-continue gabapentin.  6. History of atrial fibrillation-chronic in nature. Rate controlled. Hold metoprolol given the hypotension. -Continue Coumadin, INR therapeutic.    All the records are reviewed and case discussed with ED provider. Management plans discussed with the patient, family and they are in agreement.  CODE STATUS: Full  TOTAL TIME TAKING CARE OF THIS PATIENT: 45 minutes.    Henreitta Leber M.D on 01/26/2017 at 8:22 PM  Between 7am to 6pm - Pager - [phone]  After 6pm go to [email or web address] - password [email or web address] [facility]  Office  [phone]  CC: Primary care physician; Boykin Nearing, MD

## 2017-01-27 DIAGNOSIS — I952 Hypotension due to drugs: Secondary | ICD-10-CM | POA: Diagnosis not present

## 2017-01-27 LAB — BASIC METABOLIC PANEL
Anion gap: 6 (ref 5–15)
BUN: 17 mg/dL (ref 6–20)
CO2: 25 mmol/L (ref 22–32)
Calcium: 8.1 mg/dL — ABNORMAL LOW (ref 8.9–10.3)
Chloride: 109 mmol/L (ref 101–111)
Creatinine, Ser: 0.79 mg/dL (ref 0.44–1.00)
GFR calc Af Amer: >60 mL/min (ref >60)
GFR calc non Af Amer: >60 mL/min (ref >60)
Glucose, Bld: 125 mg/dL — ABNORMAL HIGH (ref 65–99)
Potassium: 4.2 mmol/L (ref 3.5–5.1)
Sodium: 140 mmol/L (ref 135–145)

## 2017-01-27 LAB — GLUCOSE, CAPILLARY
Glucose-Capillary: 132 mg/dL — ABNORMAL HIGH (ref 65–99)
Glucose-Capillary: 139 mg/dL — ABNORMAL HIGH (ref 65–99)

## 2017-01-27 MED ORDER — PNEUMOCOCCAL VAC POLYVALENT 25 MCG/0.5ML IJ INJ
0.5000 mL | INJECTION | Freq: Once | INTRAMUSCULAR | Status: AC
  Administered 2017-01-27: 0.5 mL via INTRAMUSCULAR
  Filled 2017-01-27: qty 0.5

## 2017-01-27 MED ORDER — METOPROLOL TARTRATE 25 MG PO TABS: 25.0000 mg | ORAL_TABLET | Freq: Two times a day (BID) | ORAL | Status: DC

## 2017-01-27 MED ORDER — PNEUMOCOCCAL VAC POLYVALENT 25 MCG/0.5ML IJ INJ: 0.5000 mL | INJECTION | INTRAMUSCULAR | Status: DC

## 2017-01-27 NOTE — Plan of Care (Signed)
MD making rounds. Received order to discharge home. IV removed. Patient provided with Education Handout. No prescriptions. Discharge paperwork provided, explained, signed and witnessed. No questions left unanswered. Discharged via wheelchair by auxiliary staff. Belongings sent with patient and family.

## 2017-01-27 NOTE — Care Management (Signed)
Admitted to this facility with the diagnosis of hypotension under observation status. Tracy Dickerson, [phone]). Last seen Dr. Honor Junes in [place] on Friday. Prescriptions are filled at [facility]. No Home Health. No skilled facility. No oxygen. Rolling Walker and cane is used to aid in ambulation. Takes care of all basic and instrumental activities of daily living herself, doesn't drive. Uses the local bus and a bus t go to [facility] appointments. No falls.  Good appetite. States she has money to pay for a Taxi home. Discharge to home today per Dr. Benjie Karvonen. Shelbie Ammons RN MSN CCM Care Management [phone]

## 2017-01-27 NOTE — Discharge Summary (Signed)
[facility] [place] NAME: Tracy Dickerson    MR#:  [id number]  DATE OF BIRTH:  [birth year]  DATE OF ADMISSION:  01/26/2017 ADMITTING PHYSICIAN: Henreitta Leber, MD  DATE OF DISCHARGE: 01/27/2017  PRIMARY CARE PHYSICIAN: Boykin Nearing, MD    ADMISSION DIAGNOSIS:  Hypotension, unspecified hypotension type [I95.9]  DISCHARGE DIAGNOSIS:  Active Problems:   Hypotension   SECONDARY DIAGNOSIS:   Past Medical History:  Diagnosis Date  . Cancer ([place])    Breast and lung  . GERD (gastroesophageal reflux disease)   . Heart disease   . Hypertension     HOSPITAL COURSE:   75 year old female with a history of essential hypertension, atrial fibrillation and diabetes who presents due to dizziness and found to be hypotensive.  1. Hypotension: This is due to numerous hypertensive medications as well as some dehydration. Blood pressure medications have been discontinued for now. She will follow up on Friday with her primary care physician. She was asked to take blood pressure daily and nightly. If her systolic blood pressures greater than 130 she may continue metoprolol. If it is greater than 150 she will take metoprolol and ACE inhibitor.  2. Diabetes: Patient will continue outpatient medications  3. GERD: Continue PPI  4. History of atrial fibrillation, chronic: Patient may resume metoprolol if blood pressure improved Patient will continue Coumadin  5. Neuropathy on gabapentin   DISCHARGE CONDITIONS AND DIET:  Stable Diabetic heart healthy diet  CONSULTS OBTAINED:    DRUG ALLERGIES:   Allergies  Allergen Reactions  . Percocet [Oxycodone-Acetaminophen]   . Penicillins Rash    DISCHARGE MEDICATIONS:   Current Discharge Medication List    CONTINUE these medications which have CHANGED   Details  metoprolol tartrate (LOPRESSOR) 25 MG tablet Take 1 tablet (25 mg total) by mouth 2 (two) times daily. Take IF BP >130 (top number)       CONTINUE these medications which have NOT CHANGED   Details  ADVAIR DISKUS 250-50 MCG/DOSE AEPB Inhale 1 puff into the lungs 2 (two) times daily.    diphenhydrAMINE (BENADRYL) 25 mg capsule Take 2 capsules (50 mg total) by mouth every 6 (six) hours as needed. Qty: 60 capsule, Refills: 0    gabapentin (NEURONTIN) 300 MG capsule Take 300 mg by mouth at bedtime.    metFORMIN (GLUCOPHAGE) 500 MG tablet Take 1 tablet (500 mg total) by mouth 2 (two) times daily. Qty: 60 tablet, Refills: 1    omeprazole (PRILOSEC) 40 MG capsule Take 40 mg by mouth daily.    pramipexole (MIRAPEX) 0.25 MG tablet Take 0.25 mg by mouth at bedtime.    traMADol (ULTRAM) 50 MG tablet Take 1 tablet (50 mg total) by mouth every 6 (six) hours as needed. Qty: 6 tablet, Refills: 0    warfarin (COUMADIN) 2.5 MG tablet Take 3.75-5 mg by mouth daily. Take 3.75 mg (1.5 tablet) on Thursday and 5 mg on all other days as directed. Refills: 0    diazepam (VALIUM) 5 MG tablet Take 1 tablet (5 mg total) by mouth every 8 (eight) hours as needed for muscle spasms. Qty: 8 tablet, Refills: 0    metoCLOPramide (REGLAN) 10 MG tablet Take 1 tablet (10 mg total) by mouth 4 (four) times daily -  before meals and at bedtime. Qty: 60 tablet, Refills: 0    naproxen (NAPROSYN) 500 MG tablet Take 1 tablet (500 mg total) by mouth 2 (two) times daily with a meal. Qty:  20 tablet, Refills: 0      STOP taking these medications     benazepril (LOTENSIN) 20 MG tablet      triamterene-hydrochlorothiazide (MAXZIDE-25) 37.5-25 MG tablet           Today   CHIEF COMPLAINT:   Patient feeling well this morning. Blood pressures improved. No dizziness or lightheadedness. No chest pain   VITAL SIGNS:  Blood pressure (!) 115/44, pulse 83, temperature 97.7 F (36.5 C), temperature source Oral, resp. rate 18, height '5\' 6"'$  (1.676 m), weight 113.4 kg (250 lb), SpO2 100 %.   REVIEW OF SYSTEMS:  Review of Systems  Constitutional:  Negative.  Negative for chills, fever and malaise/fatigue.  HENT: Negative.  Negative for ear discharge, ear pain, hearing loss, nosebleeds and sore throat.   Eyes: Negative.  Negative for blurred vision and pain.  Respiratory: Negative.  Negative for cough, hemoptysis, shortness of breath and wheezing.   Cardiovascular: Negative.  Negative for chest pain, palpitations and leg swelling.  Gastrointestinal: Negative.  Negative for abdominal pain, blood in stool, diarrhea, nausea and vomiting.  Genitourinary: Negative.  Negative for dysuria.  Musculoskeletal: Negative.  Negative for back pain.  Skin: Negative.   Neurological: Negative for dizziness, tremors, speech change, focal weakness, seizures and headaches.  Endo/Heme/Allergies: Negative.  Does not bruise/bleed easily.  Psychiatric/Behavioral: Negative.  Negative for depression, hallucinations and suicidal ideas.     PHYSICAL EXAMINATION:  GENERAL:  76 y.o.-year-old patient lying in the bed with no acute distress.  NECK:  Supple, no jugular venous distention. No thyroid enlargement, no tenderness.  LUNGS: Normal breath sounds bilaterally, no wheezing, rales,rhonchi  No use of accessory muscles of respiration.  CARDIOVASCULAR: S1, S2 normal. 2/6 SEM NO rubs, or gallops.  ABDOMEN: Soft, non-tender, non-distended. Bowel sounds present. No organomegaly or mass.  EXTREMITIES: No pedal edema, cyanosis, or clubbing.  PSYCHIATRIC: The patient is alert and oriented x 3.  SKIN: No obvious rash, lesion, or ulcer.   DATA REVIEW:   CBC  Recent Labs Lab 01/26/17 1413  WBC 10.3  HGB 8.5*  HCT 26.2*  PLT 284    Chemistries   Recent Labs Lab 01/26/17 1413 01/27/17 0356  NA 137 140  K 4.3 4.2  CL 106 109  CO2 25 25  GLUCOSE 121* 125*  BUN 19 17  CREATININE 1.13* 0.79  CALCIUM 8.7* 8.1*  AST 23  --   ALT 15  --   ALKPHOS 81  --   BILITOT 0.4  --     Cardiac Enzymes  Recent Labs Lab 01/26/17 1413  TROPONINI <0.03     Microbiology Results  '@[id number]'$ @  RADIOLOGY:  Dg Chest 1 View  Result Date: 01/26/2017 CLINICAL DATA:  Dizziness. Presyncope. Symptoms for 2 weeks, worsening today. History of hypertension, breast and lung cancer. EXAM: CHEST 1 VIEW COMPARISON:  10/31/2016 and 06/06/2016 radiographs. FINDINGS: 1645 hours. Stable mild cardiomegaly. The mediastinal contours are normal. Coarse interstitial markings and asymmetric density at the right lung base are chronic and unchanged. There is no superimposed edema, pleural effusion or pneumothorax. No acute osseous findings are seen. There are surgical clips in the left breast and axilla. IMPRESSION: Stable chest with chronic interstitial lung disease or scarring. No acute findings. Electronically Signed   By: Richardean Sale M.D.   On: 01/26/2017 17:04      Current Discharge Medication List    CONTINUE these medications which have CHANGED   Details  metoprolol tartrate (LOPRESSOR) 25 MG tablet Take 1  tablet (25 mg total) by mouth 2 (two) times daily. Take IF BP >130 (top number)      CONTINUE these medications which have NOT CHANGED   Details  ADVAIR DISKUS 250-50 MCG/DOSE AEPB Inhale 1 puff into the lungs 2 (two) times daily.    diphenhydrAMINE (BENADRYL) 25 mg capsule Take 2 capsules (50 mg total) by mouth every 6 (six) hours as needed. Qty: 60 capsule, Refills: 0    gabapentin (NEURONTIN) 300 MG capsule Take 300 mg by mouth at bedtime.    metFORMIN (GLUCOPHAGE) 500 MG tablet Take 1 tablet (500 mg total) by mouth 2 (two) times daily. Qty: 60 tablet, Refills: 1    omeprazole (PRILOSEC) 40 MG capsule Take 40 mg by mouth daily.    pramipexole (MIRAPEX) 0.25 MG tablet Take 0.25 mg by mouth at bedtime.    traMADol (ULTRAM) 50 MG tablet Take 1 tablet (50 mg total) by mouth every 6 (six) hours as needed. Qty: 6 tablet, Refills: 0    warfarin (COUMADIN) 2.5 MG tablet Take 3.75-5 mg by mouth daily. Take 3.75 mg (1.5 tablet) on Thursday and 5 mg  on all other days as directed. Refills: 0    diazepam (VALIUM) 5 MG tablet Take 1 tablet (5 mg total) by mouth every 8 (eight) hours as needed for muscle spasms. Qty: 8 tablet, Refills: 0    metoCLOPramide (REGLAN) 10 MG tablet Take 1 tablet (10 mg total) by mouth 4 (four) times daily -  before meals and at bedtime. Qty: 60 tablet, Refills: 0    naproxen (NAPROSYN) 500 MG tablet Take 1 tablet (500 mg total) by mouth 2 (two) times daily with a meal. Qty: 20 tablet, Refills: 0      STOP taking these medications     benazepril (LOTENSIN) 20 MG tablet      triamterene-hydrochlorothiazide (MAXZIDE-25) 37.5-25 MG tablet            Management plans discussed with the patient and she is in agreement. Stable for discharge home  Patient should follow up with pcp  CODE STATUS:     Code Status Orders        Start     Ordered   01/26/17 2251  Full code  Continuous     01/26/17 2250    Code Status History    Date Active Date Inactive Code Status Order ID Comments User Context   This patient has a current code status but no historical code status.      TOTAL TIME TAKING CARE OF THIS PATIENT: 37 minutes.    Note: This dictation was prepared with Dragon dictation along with smaller phrase technology. Any transcriptional errors that result from this process are unintentional.  Delray Reza M.D on 01/27/2017 at 9:45 AM  Between 7am to 6pm - Pager - [phone] After 6pm go to [email or web address] - password [email or web address] [facility]  Office  [phone]  CC: Primary care physician; Boykin Nearing, MD

## 2017-01-27 NOTE — Care Management Obs Status (Signed)
[facility] NOTIFICATION   Patient Details  Name: Tracy Dickerson MRN: [id number] Date of Birth: 11/21/1940   Medicare Observation Status Notification Given:  Yes    Shelbie Ammons, RN 01/27/2017, 10:14 AM

## 2017-01-27 NOTE — Discharge Instructions (Addendum)
Hypotension As your heart beats, it forces blood through your body. This force is called blood pressure. If you have hypotension, you have low blood pressure. When your blood pressure is too low, you may not get enough blood to your brain. You may feel weak, feel light-headed, have a fast heartbeat, or even pass out (faint). Follow these instructions at home: Eating and drinking  Drink enough fluids to keep your pee (urine) clear or pale yellow.  Eat a healthy diet, and follow instructions from your doctor about eating or drinking restrictions. A healthy diet includes: ? Fresh fruits and vegetables. ? Whole grains. ? Low-fat (lean) meats. ? Low-fat dairy products.  Eat extra salt only as told. Do not add extra salt to your diet unless your doctor tells you to.  Eat small meals often.  Avoid standing up quickly after you eat. Medicines  Take over-the-counter and prescription medicines only as told by your doctor. ? Follow instructions from your doctor about changing how much you take (the dosage) of your medicines, if this applies. ? Do not stop or change your medicine on your own. General instructions  Wear compression stockings as told by your doctor.  Get up slowly from lying down or sitting.  Avoid hot showers and a lot of heat as told by your doctor.  Return to your normal activities as told by your doctor. Ask what activities are safe for you.  Do not use any products that contain nicotine or tobacco, such as cigarettes and e-cigarettes. If you need help quitting, ask your doctor.  Keep all follow-up visits as told by your doctor. This is important. Contact a doctor if:  You throw up (vomit).  You have watery poop (diarrhea).  You have a fever for more than 2-3 days.  You feel more thirsty than normal.  You feel weak and tired. Get help right away if:  You have chest pain.  You have a fast or irregular heartbeat.  You lose feeling (get numbness) in any part  of your body.  You cannot move your arms or your legs.  You have trouble talking.  You get sweaty or feel light-headed.  You faint.  You have trouble breathing.  You have trouble staying awake.  You feel confused. This information is not intended to replace advice given to you by your health care provider. Make sure you discuss any questions you have with your health care provider. Document Released: 12/04/2009 Document Revised: 05/28/2016 Document Reviewed: 05/28/2016 Elsevier Interactive Patient Education  2017 [employer]   

## 2017-02-18 ENCOUNTER — Emergency Department
Admission: EM | Admit: 2017-02-18 | Discharge: 2017-02-18 | Disposition: A | Discharge status: Home / Self Care | Payer: Medicare Other | Attending: Emergency Medicine | Admitting: Emergency Medicine

## 2017-02-18 ENCOUNTER — Encounter: Payer: Self-pay | Admitting: Emergency Medicine

## 2017-02-18 DIAGNOSIS — D048 Carcinoma in situ of skin of other sites: Secondary | ICD-10-CM | POA: Insufficient documentation

## 2017-02-18 DIAGNOSIS — I10 Essential (primary) hypertension: Secondary | ICD-10-CM | POA: Insufficient documentation

## 2017-02-18 DIAGNOSIS — Z79899 Other long term (current) drug therapy: Secondary | ICD-10-CM | POA: Insufficient documentation

## 2017-02-18 DIAGNOSIS — Z87891 Personal history of nicotine dependence: Secondary | ICD-10-CM | POA: Diagnosis not present

## 2017-02-18 DIAGNOSIS — D099 Carcinoma in situ, unspecified: Secondary | ICD-10-CM

## 2017-02-18 MED ORDER — TRAMADOL HCL 50 MG PO TABS
50.0000 mg | ORAL_TABLET | Freq: Two times a day (BID) | ORAL | 0 refills | Status: DC | PRN
Start: 2017-02-18 — End: 2017-10-31

## 2017-02-18 NOTE — Discharge Instructions (Signed)
Follow-up tomorrow for scheduled surgery

## 2017-02-18 NOTE — ED Provider Notes (Signed)
[facility] Emergency Department Provider Note   ____________________________________________   First MD Initiated Contact with Patient 02/18/17 1617     (approximate)  I have reviewed the triage vital signs and the nursing notes.   HISTORY  Chief Complaint second opinion    HPI Tracy Dickerson is a 76 y.o. 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 Patient scheduled for excision of lesion tomorrow morning. Patient comes in today requesting second opinion before procedure tomorrow morning.patient stated the biopsy site is painful. Patient rates pain as a 10 over 10.no palliative measures for her complaint of pain.   Past Medical History:  Diagnosis Date  . Cancer ([place])    Breast and lung  . GERD (gastroesophageal reflux disease)   . Heart disease   . Hypertension     Patient Active Problem List   Diagnosis Date Noted  . Hypotension 01/26/2017    Past Surgical History:  Procedure Laterality Date  . ABDOMINAL HYSTERECTOMY    . APPENDECTOMY    . BREAST SURGERY     breast cancer  . TONSILLECTOMY      Prior to Admission medications   Medication Sig Start Date End Date Taking? Authorizing Provider  ADVAIR DISKUS 250-50 MCG/DOSE AEPB Inhale 1 puff into the lungs 2 (two) times daily. 12/14/16   [provider]  diazepam (VALIUM) 5 MG tablet Take 1 tablet (5 mg total) by mouth every 8 (eight) hours as needed for muscle spasms. Patient not taking: Reported on 01/26/2017 06/08/15   Carrie Mew, MD  diphenhydrAMINE (BENADRYL) 25 mg capsule Take 2 capsules (50 mg total) by mouth every 6 (six) hours as needed. 06/08/15   Carrie Mew, MD  gabapentin (NEURONTIN) 300 MG capsule Take 300 mg by mouth at bedtime. 12/15/16   [provider]  metFORMIN (GLUCOPHAGE) 500 MG tablet Take 1 tablet (500 mg total) by mouth 2 (two) times daily. 02/06/15 01/26/18  Harvest Dark, MD    metoCLOPramide (REGLAN) 10 MG tablet Take 1 tablet (10 mg total) by mouth 4 (four) times daily -  before meals and at bedtime. Patient not taking: Reported on 01/26/2017 06/08/15   Carrie Mew, MD  metoprolol tartrate (LOPRESSOR) 25 MG tablet Take 1 tablet (25 mg total) by mouth 2 (two) times daily. Take IF BP >130 (top number) 01/27/17   Bettey Costa, MD  naproxen (NAPROSYN) 500 MG tablet Take 1 tablet (500 mg total) by mouth 2 (two) times daily with a meal. Patient not taking: Reported on 01/26/2017 06/08/15   Carrie Mew, MD  omeprazole (PRILOSEC) 40 MG capsule Take 40 mg by mouth daily. 01/15/17   [provider]  pramipexole (MIRAPEX) 0.25 MG tablet Take 0.25 mg by mouth at bedtime. 12/23/16   [provider]  traMADol (ULTRAM) 50 MG tablet Take 1 tablet (50 mg total) by mouth every 12 (twelve) hours as needed. 02/18/17   Sable Feil, PA-C  warfarin (COUMADIN) 2.5 MG tablet Take 3.75-5 mg by mouth daily. Take 3.75 mg (1.5 tablet) on Thursday and 5 mg on all other days as directed. 01/21/17   [provider]    Allergies Percocet [oxycodone-acetaminophen] and Penicillins  Family History  Problem Relation Age of Onset  . Cancer Mother     Social History Social History  Substance Use Topics  . Smoking status: Former Research scientist (life sciences)  . Smokeless tobacco: Never Used  . Alcohol use Yes    Review of  Systems  Constitutional: No fever/chills Eyes: No visual changes. ENT: No sore throat. Cardiovascular: Denies chest pain. Respiratory: Denies shortness of breath. Gastrointestinal: No abdominal pain.  No nausea, no vomiting.  No diarrhea.  No constipation. Genitourinary: Negative for dysuria. Musculoskeletal: Negative for back pain. Skin: skin lesionl eft upper chest. Neurological: Negative for headaches, focal weakness or numbness.   ____________________________________________   PHYSICAL EXAM:  VITAL SIGNS: ED Triage Vitals  Enc Vitals Group     BP  02/18/17 1601 127/67     Pulse Rate 02/18/17 1601 (!) 102     Resp 02/18/17 1601 18     Temp 02/18/17 1601 98.1 F (36.7 C)     Temp Source 02/18/17 1601 Oral     SpO2 02/18/17 1601 99 %     Weight 02/18/17 1600 255 lb (115.7 kg)     Height 02/18/17 1600 5\' 6"  (1.676 m)     Head Circumference --      Peak Flow --      Pain Score 02/18/17 1600 10     Pain Loc --      Pain Edu? --      Excl. in [place]? --     Constitutional: Alert and oriented. Well appearing and in no acute distress.  Appears anxious Eyes: Conjunctivae are normal. PERRL. EOMI. Hematological/Lymphatic/Immunilogical: No cervical lymphadenopathy. Cardiovascular: Normal rate, regular rhythm. Grossly normal heart sounds.  Good peripheral circulation. Respiratory: Normal respiratory effort.  No retractions. Lungs CTAB. Neurologic:  Normal speech and language. No gross focal neurologic deficits are appreciated. No gait instability. Skin:  Skin is warm, dry and intact. No rash noted.mild erythema and guarding at biopsy site. Psychiatric: Mood and affect are normal. Speech and behavior are normal.  ____________________________________________   LABS (all labs ordered are listed, but only abnormal results are displayed)  Labs Reviewed - No data to display ____________________________________________  EKG   ____________________________________________  RADIOLOGY   ____________________________________________   PROCEDURES  Procedure(s) performed: None  Procedures  Critical Care performed: No  ____________________________________________   INITIAL IMPRESSION / ASSESSMENT AND PLAN / ED COURSE  Pertinent labs & imaging results that were available during my care of the patient were reviewed by me and considered in my medical decision making (see chart for details).  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 Advised patient if she is having second thoughts to contact treating  doctor as soon as possible.  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     Note:  This document was prepared using Dragon voice recognition software and may include unintentional dictation errors.    Sable Feil, PA-C 02/18/17 1639    Carrie Mew, MD 02/18/17 2027

## 2017-02-18 NOTE — ED Notes (Signed)
See triage note..the patient denies other medical issues at this time. Pt sts that she is here to seek second opinion, that she does have upcoming appointment w/ specialist

## 2017-02-18 NOTE — ED Triage Notes (Addendum)
Pt reports supposed to have spot that was skin cancer and wants a second opinion for someone to look at it and see if they think it is skin cancer.  Explained to pt that we do not specialize in this and probably needs to see a dermatologist or other specialist for this but will check her in and see what provider says.  Clarified no other needs today other than she wants someone's opinion if they think spot on left chest is skin cancer.

## 2017-04-18 ENCOUNTER — Emergency Department: Payer: Medicare Other

## 2017-04-18 ENCOUNTER — Emergency Department
Admission: EM | Admit: 2017-04-18 | Discharge: 2017-04-18 | Disposition: A | Discharge status: Home / Self Care | Payer: Medicare Other | Attending: Emergency Medicine | Admitting: Emergency Medicine

## 2017-04-18 DIAGNOSIS — W108XXA Fall (on) (from) other stairs and steps, initial encounter: Secondary | ICD-10-CM | POA: Diagnosis not present

## 2017-04-18 DIAGNOSIS — R42 Dizziness and giddiness: Secondary | ICD-10-CM | POA: Diagnosis not present

## 2017-04-18 DIAGNOSIS — W19XXXA Unspecified fall, initial encounter: Secondary | ICD-10-CM

## 2017-04-18 DIAGNOSIS — D649 Anemia, unspecified: Secondary | ICD-10-CM | POA: Diagnosis not present

## 2017-04-18 DIAGNOSIS — Y999 Unspecified external cause status: Secondary | ICD-10-CM | POA: Diagnosis not present

## 2017-04-18 DIAGNOSIS — S3992XA Unspecified injury of lower back, initial encounter: Secondary | ICD-10-CM | POA: Diagnosis present

## 2017-04-18 DIAGNOSIS — Y92811 Bus as the place of occurrence of the external cause: Secondary | ICD-10-CM | POA: Diagnosis not present

## 2017-04-18 DIAGNOSIS — S80811A Abrasion, right lower leg, initial encounter: Secondary | ICD-10-CM | POA: Insufficient documentation

## 2017-04-18 DIAGNOSIS — Z87891 Personal history of nicotine dependence: Secondary | ICD-10-CM | POA: Diagnosis not present

## 2017-04-18 DIAGNOSIS — S39012A Strain of muscle, fascia and tendon of lower back, initial encounter: Secondary | ICD-10-CM | POA: Diagnosis not present

## 2017-04-18 DIAGNOSIS — Y939 Activity, unspecified: Secondary | ICD-10-CM | POA: Insufficient documentation

## 2017-04-18 DIAGNOSIS — S80812A Abrasion, left lower leg, initial encounter: Secondary | ICD-10-CM | POA: Diagnosis not present

## 2017-04-18 DIAGNOSIS — I10 Essential (primary) hypertension: Secondary | ICD-10-CM | POA: Diagnosis not present

## 2017-04-18 LAB — CBC WITH DIFFERENTIAL/PLATELET
Basophils Absolute: 0.1 10*3/uL (ref 0–0.1)
Basophils Relative: 1 %
Eosinophils Absolute: 0.3 10*3/uL (ref 0–0.7)
Eosinophils Relative: 2 %
HCT: 25.6 % — ABNORMAL LOW (ref 35.0–47.0)
Hemoglobin: 7.9 g/dL — ABNORMAL LOW (ref 12.0–16.0)
Lymphocytes Relative: 18 %
Lymphs Abs: 2.1 10*3/uL (ref 1.0–3.6)
MCH: 25 pg — ABNORMAL LOW (ref 26.0–34.0)
MCHC: 31 g/dL — ABNORMAL LOW (ref 32.0–36.0)
MCV: 80.6 fL (ref 80.0–100.0)
Monocytes Absolute: 0.6 10*3/uL (ref 0.2–0.9)
Monocytes Relative: 5 %
Neutro Abs: 8.4 10*3/uL — ABNORMAL HIGH (ref 1.4–6.5)
Neutrophils Relative %: 74 %
Platelets: 270 10*3/uL (ref 150–440)
RBC: 3.17 MIL/uL — ABNORMAL LOW (ref 3.80–5.20)
RDW: 19.1 % — ABNORMAL HIGH (ref 11.5–14.5)
WBC: 11.4 10*3/uL — ABNORMAL HIGH (ref 3.6–11.0)

## 2017-04-18 LAB — URINALYSIS, COMPLETE (UACMP) WITH MICROSCOPIC
Bilirubin Urine: NEGATIVE
Glucose, UA: NEGATIVE mg/dL
Hgb urine dipstick: NEGATIVE
Ketones, ur: NEGATIVE mg/dL
Leukocytes, UA: NEGATIVE
Nitrite: NEGATIVE
Protein, ur: NEGATIVE mg/dL
Specific Gravity, Urine: 1.01 (ref 1.005–1.030)
pH: 6 (ref 5.0–8.0)

## 2017-04-18 LAB — COMPREHENSIVE METABOLIC PANEL
ALT: 17 U/L (ref 14–54)
AST: 30 U/L (ref 15–41)
Albumin: 3.4 g/dL — ABNORMAL LOW (ref 3.5–5.0)
Alkaline Phosphatase: 69 U/L (ref 38–126)
Anion gap: 10 (ref 5–15)
BUN: 16 mg/dL (ref 6–20)
CO2: 24 mmol/L (ref 22–32)
Calcium: 8.8 mg/dL — ABNORMAL LOW (ref 8.9–10.3)
Chloride: 105 mmol/L (ref 101–111)
Creatinine, Ser: 0.91 mg/dL (ref 0.44–1.00)
GFR calc Af Amer: >60 mL/min (ref >60)
GFR calc non Af Amer: >60 mL/min (ref >60)
Glucose, Bld: 87 mg/dL (ref 65–99)
Potassium: 3.8 mmol/L (ref 3.5–5.1)
Sodium: 139 mmol/L (ref 135–145)
Total Bilirubin: 0.5 mg/dL (ref 0.3–1.2)
Total Protein: 6.8 g/dL (ref 6.5–8.1)

## 2017-04-18 LAB — IRON AND TIBC
Iron: 18 ug/dL — ABNORMAL LOW (ref 28–170)
Saturation Ratios: 4 % — ABNORMAL LOW (ref 10.4–31.8)
TIBC: 446 ug/dL (ref 250–450)
UIBC: 428 ug/dL

## 2017-04-18 LAB — TROPONIN I: Troponin I: <0.03 ng/mL (ref <0.03)

## 2017-04-18 MED ORDER — FERROUS SULFATE DRIED ER 160 (50 FE) MG PO TBCR: 160.0000 mg | EXTENDED_RELEASE_TABLET | Freq: Every day | ORAL | 6 refills | Status: DC

## 2017-04-18 MED ORDER — TRAMADOL HCL 50 MG PO TABS: 50.0000 mg | ORAL_TABLET | Freq: Four times a day (QID) | ORAL | 0 refills | Status: DC | PRN

## 2017-04-18 MED ORDER — BACITRACIN ZINC 500 UNIT/GM EX OINT
TOPICAL_OINTMENT | CUTANEOUS | Status: AC
  Administered 2017-04-18: 1 via TOPICAL
  Filled 2017-04-18: qty 1.8

## 2017-04-18 MED ORDER — BACITRACIN ZINC 500 UNIT/GM EX OINT
TOPICAL_OINTMENT | Freq: Once | CUTANEOUS | Status: AC
  Administered 2017-04-18: 1 via TOPICAL

## 2017-04-18 NOTE — ED Provider Notes (Signed)
[facility] Emergency Department Provider Note       Time seen: ----------------------------------------- 11:51 AM on 04/18/2017 -----------------------------------------     I have reviewed the triage vital signs and the nursing notes.   HISTORY   Chief Complaint No chief complaint on file.    HPI Tracy Dickerson is a 76 y.o. female who presents to the ED for a fall that occurred at the bus stop. According to EMS she fell getting off the bus and tripped over her walker resulting in abrasions to both legs and some low back pain. She reports to feeling dizzy all the time but denies any acute sickness or illness. She arrives alert and oriented with complaints of soreness and persistent dizziness.   Past Medical History:  Diagnosis Date  . Cancer ([place])    Breast and lung  . GERD (gastroesophageal reflux disease)   . Heart disease   . Hypertension     Patient Active Problem List   Diagnosis Date Noted  . Hypotension 01/26/2017    Past Surgical History:  Procedure Laterality Date  . ABDOMINAL HYSTERECTOMY    . APPENDECTOMY    . BREAST SURGERY     breast cancer  . TONSILLECTOMY      Allergies Percocet [oxycodone-acetaminophen] and Penicillins  Social History Social History  Substance Use Topics  . Smoking status: Former Research scientist (life sciences)  . Smokeless tobacco: Never Used  . Alcohol use Yes    Review of Systems Constitutional: Negative for fever. Cardiovascular: Negative for chest pain. Respiratory: Negative for shortness of breath. Gastrointestinal: Negative for abdominal pain, vomiting and diarrhea. Genitourinary: Negative for dysuria. Musculoskeletal: Positive for low back pain Skin: Positive for leg abrasions Neurological: Negative for headaches, focal weakness or numbness.  All systems negative/normal/unremarkable except as stated in the HPI  ____________________________________________   PHYSICAL EXAM:  VITAL SIGNS: ED Triage  Vitals  Enc Vitals Group     BP      Pulse      Resp      Temp      Temp src      SpO2      Weight      Height      Head Circumference      Peak Flow      Pain Score      Pain Loc      Pain Edu?      Excl. in [place]?    Constitutional: Alert and oriented. Well appearing and in no distress. Eyes: Conjunctivae are normal. Normal extraocular movements. ENT   Head: Normocephalic and atraumatic.   Nose: No congestion/rhinnorhea.   Mouth/Throat: Mucous membranes are moist.   Neck: No stridor. Cardiovascular: Normal rate, regular rhythm. No murmurs, rubs, or gallops. Respiratory: Normal respiratory effort without tachypnea nor retractions. Breath sounds are clear and equal bilaterally. No wheezes/rales/rhonchi. Gastrointestinal: Soft and nontender. Normal bowel sounds Musculoskeletal: Nontender with normal range of motion in extremities. No lower extremity tenderness nor edema. Neurologic:  Normal speech and language. No gross focal neurologic deficits are appreciated.  Skin:  Abrasions noted over both lower legs Psychiatric: Mood and affect are normal. Speech and behavior are normal.  ____________________________________________  EKG: Interpreted by me. Sinus tachycardia with a rate of 109 bpm, normal PR interval, normal QRS, normal QT.  ____________________________________________  ED COURSE:  Pertinent labs & imaging results that were available during my care of the patient were reviewed by me and considered in my medical decision making (see chart for details).  Patient presents for dizziness and mechanical fall, we will assess with labs and imaging as indicated.   Procedures ____________________________________________   LABS (pertinent positives/negatives)  Labs Reviewed  CBC WITH DIFFERENTIAL/PLATELET - Abnormal; Notable for the following:       Result Value   WBC 11.4 (*)    RBC 3.17 (*)    Hemoglobin 7.9 (*)    HCT 25.6 (*)    MCH 25.0 (*)    MCHC 31.0  (*)    RDW 19.1 (*)    Neutro Abs 8.4 (*)    All other components within normal limits  COMPREHENSIVE METABOLIC PANEL - Abnormal; Notable for the following:    Calcium 8.8 (*)    Albumin 3.4 (*)    All other components within normal limits  URINALYSIS, COMPLETE (UACMP) WITH MICROSCOPIC - Abnormal; Notable for the following:    Color, Urine YELLOW (*)    APPearance HAZY (*)    Bacteria, UA MANY (*)    Squamous Epithelial / LPF 6-30 (*)    All other components within normal limits  TROPONIN I    RADIOLOGY  Lumbar spine x-ray IMPRESSION: 1. No acute abnormality of the lumbar spine. 2. 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 ____________________________________________  FINAL ASSESSMENT AND PLAN  Dizziness, fall, lumbosacral strain, abrasion, chronic anemia  Plan: Patient's labs and imaging were dictated above. Patient had presented for A likely mechanical fall but she does have a chronic dizziness which could be related to her chronic anemia. Patient states she is unaware if she was anemic and this does suggest that she may be iron deficient. I will add on some iron studies and refer her for outpatient follow-up.   Earleen Newport, MD   Note: This note was generated in part or whole with voice recognition software. Voice recognition is usually quite accurate but there are transcription errors that can and very often do occur. I apologize for any typographical errors that were not detected and corrected.     Earleen Newport, MD 04/18/17 [phone]

## 2017-04-18 NOTE — ED Notes (Signed)
Pt requesting bus pass.  Bus pass given and to be driven to bus stop by security.

## 2017-04-18 NOTE — ED Notes (Signed)
Pt attempting to call for ride.  No transport available.

## 2017-04-18 NOTE — ED Triage Notes (Addendum)
Pt to ED from bus stop c/o fall. Per EMS pt fell while getting off the bus and tripped over walker resulting in bilateral abrasions to legs. Pt reports feeling dizzy all the time. She is otherwise alert and oriented in no acute distress at this time.

## 2017-04-18 NOTE — ED Notes (Signed)
Assisting patient to bathroom.

## 2017-10-29 ENCOUNTER — Emergency Department: Payer: Medicare Other

## 2017-10-29 ENCOUNTER — Inpatient Hospital Stay
Admission: EM | Admit: 2017-10-29 | Discharge: 2017-10-31 | DRG: 871 | Disposition: A | Discharge status: Home / Self Care | Payer: Medicare Other | Attending: Internal Medicine | Admitting: Internal Medicine

## 2017-10-29 ENCOUNTER — Encounter: Payer: Self-pay | Admitting: Emergency Medicine

## 2017-10-29 DIAGNOSIS — Z85828 Personal history of other malignant neoplasm of skin: Secondary | ICD-10-CM

## 2017-10-29 DIAGNOSIS — E872 Acidosis: Secondary | ICD-10-CM | POA: Diagnosis not present

## 2017-10-29 DIAGNOSIS — Z880 Allergy status to penicillin: Secondary | ICD-10-CM

## 2017-10-29 DIAGNOSIS — Z66 Do not resuscitate: Secondary | ICD-10-CM | POA: Diagnosis not present

## 2017-10-29 DIAGNOSIS — I482 Chronic atrial fibrillation: Secondary | ICD-10-CM | POA: Diagnosis present

## 2017-10-29 DIAGNOSIS — I10 Essential (primary) hypertension: Secondary | ICD-10-CM | POA: Diagnosis not present

## 2017-10-29 DIAGNOSIS — J181 Lobar pneumonia, unspecified organism: Secondary | ICD-10-CM | POA: Diagnosis present

## 2017-10-29 DIAGNOSIS — Z87891 Personal history of nicotine dependence: Secondary | ICD-10-CM

## 2017-10-29 DIAGNOSIS — A419 Sepsis, unspecified organism: Principal | ICD-10-CM | POA: Diagnosis present

## 2017-10-29 DIAGNOSIS — Z7901 Long term (current) use of anticoagulants: Secondary | ICD-10-CM | POA: Diagnosis not present

## 2017-10-29 DIAGNOSIS — Z7984 Long term (current) use of oral hypoglycemic drugs: Secondary | ICD-10-CM | POA: Diagnosis not present

## 2017-10-29 DIAGNOSIS — Z853 Personal history of malignant neoplasm of breast: Secondary | ICD-10-CM

## 2017-10-29 DIAGNOSIS — Z85118 Personal history of other malignant neoplasm of bronchus and lung: Secondary | ICD-10-CM | POA: Diagnosis not present

## 2017-10-29 DIAGNOSIS — R0602 Shortness of breath: Secondary | ICD-10-CM | POA: Diagnosis present

## 2017-10-29 DIAGNOSIS — K219 Gastro-esophageal reflux disease without esophagitis: Secondary | ICD-10-CM | POA: Diagnosis not present

## 2017-10-29 DIAGNOSIS — J440 Chronic obstructive pulmonary disease with acute lower respiratory infection: Secondary | ICD-10-CM | POA: Diagnosis present

## 2017-10-29 DIAGNOSIS — Z902 Acquired absence of lung [part of]: Secondary | ICD-10-CM | POA: Diagnosis not present

## 2017-10-29 DIAGNOSIS — E119 Type 2 diabetes mellitus without complications: Secondary | ICD-10-CM | POA: Diagnosis present

## 2017-10-29 DIAGNOSIS — J189 Pneumonia, unspecified organism: Secondary | ICD-10-CM | POA: Diagnosis present

## 2017-10-29 HISTORY — DX: Type 2 diabetes mellitus without complications: E11.9

## 2017-10-29 LAB — GLUCOSE, CAPILLARY: Glucose-Capillary: 138 mg/dL — ABNORMAL HIGH (ref 65–99)

## 2017-10-29 LAB — CBC
HCT: 31.4 % — ABNORMAL LOW (ref 35.0–47.0)
Hemoglobin: 10.1 g/dL — ABNORMAL LOW (ref 12.0–16.0)
MCH: 25 pg — ABNORMAL LOW (ref 26.0–34.0)
MCHC: 32.2 g/dL (ref 32.0–36.0)
MCV: 77.7 fL — ABNORMAL LOW (ref 80.0–100.0)
Platelets: 289 10*3/uL (ref 150–440)
RBC: 4.04 MIL/uL (ref 3.80–5.20)
RDW: 19.1 % — ABNORMAL HIGH (ref 11.5–14.5)
WBC: 22.7 10*3/uL — ABNORMAL HIGH (ref 3.6–11.0)

## 2017-10-29 LAB — BASIC METABOLIC PANEL
Anion gap: 11 (ref 5–15)
BUN: 23 mg/dL — ABNORMAL HIGH (ref 6–20)
CO2: 24 mmol/L (ref 22–32)
Calcium: 8.7 mg/dL — ABNORMAL LOW (ref 8.9–10.3)
Chloride: 102 mmol/L (ref 101–111)
Creatinine, Ser: 0.86 mg/dL (ref 0.44–1.00)
GFR calc Af Amer: >60 mL/min (ref >60)
GFR calc non Af Amer: >60 mL/min (ref >60)
Glucose, Bld: 150 mg/dL — ABNORMAL HIGH (ref 65–99)
Potassium: 3.3 mmol/L — ABNORMAL LOW (ref 3.5–5.1)
Sodium: 137 mmol/L (ref 135–145)

## 2017-10-29 LAB — DIFFERENTIAL
Basophils Absolute: 0.1 10*3/uL (ref 0–0.1)
Basophils Relative: 0 %
Eosinophils Absolute: 0 10*3/uL (ref 0–0.7)
Eosinophils Relative: 0 %
Lymphocytes Relative: 7 %
Lymphs Abs: 1.7 10*3/uL (ref 1.0–3.6)
Monocytes Absolute: 1 10*3/uL — ABNORMAL HIGH (ref 0.2–0.9)
Monocytes Relative: 4 %
Neutro Abs: 21.2 10*3/uL — ABNORMAL HIGH (ref 1.4–6.5)
Neutrophils Relative %: 89 %

## 2017-10-29 LAB — INFLUENZA PANEL BY PCR (TYPE A & B)
Influenza A By PCR: NEGATIVE
Influenza B By PCR: NEGATIVE

## 2017-10-29 LAB — HEMOGLOBIN A1C
Hgb A1c MFr Bld: 6.7 % — ABNORMAL HIGH (ref 4.8–5.6)
Mean Plasma Glucose: 145.59 mg/dL

## 2017-10-29 LAB — LACTIC ACID, PLASMA: Lactic Acid, Venous: 2.6 mmol/L (ref 0.5–1.9)

## 2017-10-29 LAB — TROPONIN I: Troponin I: <0.03 ng/mL (ref <0.03)

## 2017-10-29 MED ORDER — INSULIN ASPART 100 UNIT/ML ~~LOC~~ SOLN
0.0000 [IU] | Freq: Every day | SUBCUTANEOUS | Status: DC
Start: 2017-10-29 — End: 2017-10-31

## 2017-10-29 MED ORDER — ENOXAPARIN SODIUM 40 MG/0.4ML ~~LOC~~ SOLN
SUBCUTANEOUS | Status: AC
  Filled 2017-10-29: qty 0.4

## 2017-10-29 MED ORDER — SODIUM CHLORIDE 0.9 % IV BOLUS (SEPSIS)
1000.0000 mL | Freq: Once | INTRAVENOUS | Status: AC
  Administered 2017-10-29: 1000 mL via INTRAVENOUS

## 2017-10-29 MED ORDER — ACETAMINOPHEN 325 MG PO TABS: 650.0000 mg | ORAL_TABLET | Freq: Four times a day (QID) | ORAL | Status: DC | PRN

## 2017-10-29 MED ORDER — ONDANSETRON HCL 4 MG PO TABS: 4.0000 mg | ORAL_TABLET | Freq: Four times a day (QID) | ORAL | Status: DC | PRN

## 2017-10-29 MED ORDER — INSULIN ASPART 100 UNIT/ML ~~LOC~~ SOLN
0.0000 [IU] | Freq: Three times a day (TID) | SUBCUTANEOUS | Status: DC
  Administered 2017-10-30: 2 [IU] via SUBCUTANEOUS
  Administered 2017-10-30: 18:00:00 1 [IU] via SUBCUTANEOUS
  Administered 2017-10-30: 2 [IU] via SUBCUTANEOUS
  Administered 2017-10-31 (×2): 1 [IU] via SUBCUTANEOUS
  Filled 2017-10-29 (×5): qty 1

## 2017-10-29 MED ORDER — ALBUTEROL SULFATE (2.5 MG/3ML) 0.083% IN NEBU
2.5000 mg | INHALATION_SOLUTION | RESPIRATORY_TRACT | Status: DC | PRN
Start: 2017-10-29 — End: 2017-10-31

## 2017-10-29 MED ORDER — ONDANSETRON HCL 4 MG/2ML IJ SOLN: 4.0000 mg | Freq: Four times a day (QID) | INTRAMUSCULAR | Status: DC | PRN

## 2017-10-29 MED ORDER — ENOXAPARIN SODIUM 40 MG/0.4ML ~~LOC~~ SOLN
40.0000 mg | SUBCUTANEOUS | Status: DC
  Administered 2017-10-29: 40 mg via SUBCUTANEOUS

## 2017-10-29 MED ORDER — LEVOFLOXACIN IN D5W 750 MG/150ML IV SOLN
750.0000 mg | INTRAVENOUS | Status: DC
  Administered 2017-10-30: 750 mg via INTRAVENOUS
  Filled 2017-10-29 (×2): qty 150

## 2017-10-29 MED ORDER — IPRATROPIUM-ALBUTEROL 0.5-2.5 (3) MG/3ML IN SOLN
3.0000 mL | Freq: Once | RESPIRATORY_TRACT | Status: AC
Start: 2017-10-29 — End: 2017-10-29
  Administered 2017-10-29: 3 mL via RESPIRATORY_TRACT

## 2017-10-29 MED ORDER — VANCOMYCIN HCL IN DEXTROSE 1-5 GM/200ML-% IV SOLN
INTRAVENOUS | Status: AC
  Administered 2017-10-29: 1 g
  Filled 2017-10-29: qty 200

## 2017-10-29 MED ORDER — ACETAMINOPHEN 650 MG RE SUPP: 650.0000 mg | Freq: Four times a day (QID) | RECTAL | Status: DC | PRN

## 2017-10-29 MED ORDER — IOPAMIDOL (ISOVUE-300) INJECTION 61%
75.0000 mL | Freq: Once | INTRAVENOUS | Status: AC | PRN
  Administered 2017-10-29: 75 mL via INTRAVENOUS

## 2017-10-29 MED ORDER — LEVOFLOXACIN IN D5W 750 MG/150ML IV SOLN
750.0000 mg | Freq: Once | INTRAVENOUS | Status: AC
  Administered 2017-10-29: 750 mg via INTRAVENOUS
  Filled 2017-10-29: qty 150

## 2017-10-29 MED ORDER — VANCOMYCIN HCL IN DEXTROSE 1-5 GM/200ML-% IV SOLN: 1000.0000 mg | Freq: Once | INTRAVENOUS | Status: DC

## 2017-10-29 MED ORDER — POLYETHYLENE GLYCOL 3350 17 G PO PACK: 17.0000 g | PACK | Freq: Every day | ORAL | Status: DC | PRN

## 2017-10-29 MED ORDER — NYSTATIN 100000 UNIT/ML MT SUSP
5.0000 mL | Freq: Four times a day (QID) | OROMUCOSAL | Status: DC
  Administered 2017-10-29 – 2017-10-31 (×6): 500000 [IU] via OROMUCOSAL
  Filled 2017-10-29 (×6): qty 5

## 2017-10-29 MED ORDER — SODIUM CHLORIDE 0.9 % IV SOLN
Freq: Once | INTRAVENOUS | Status: AC
  Administered 2017-10-29: 19:00:00 via INTRAVENOUS

## 2017-10-29 MED ORDER — IPRATROPIUM-ALBUTEROL 0.5-2.5 (3) MG/3ML IN SOLN
3.0000 mL | Freq: Once | RESPIRATORY_TRACT | Status: AC
  Administered 2017-10-29: 3 mL via RESPIRATORY_TRACT
  Filled 2017-10-29: qty 3

## 2017-10-29 NOTE — ED Notes (Signed)
Attempt to obtain IV access unsuccessful by this RN. Primary RN aware.

## 2017-10-29 NOTE — ED Triage Notes (Signed)
Pt in via EMS, Pt was walking home from the bus stop and pt became short of breath. Pt advised that this is normal for her when she walks long distances. Pt advised that she had been to court this am with her brother and was on the way home when all this took place. Pt did not appear to be in any distress upon arrival to the er.

## 2017-10-29 NOTE — ED Provider Notes (Signed)
[facility] Emergency Department Provider Note    None    (approximate)  I have reviewed the triage vital signs and the nursing notes.   HISTORY  Chief Complaint Shortness of Breath    HPI Tracy Dickerson is a 77 y.o. female with a history of breast lung and skin cancer as well as history of COPD on nebulizer treatments but no recent antibiotics or steroids presents with 1 day of worsening shortness of breath nonproductive cough and fatigue.  Has significant shortness of breath with ambulation and exertional dyspnea.  Denies any pain or pressure.  No nausea or vomiting.  No fevers or chills.  Did have some improvement with nebulizers at home.  Past Medical History:  Diagnosis Date  . Cancer ([place])    Breast and lung  . Diabetes mellitus without complication ([place])   . GERD (gastroesophageal reflux disease)   . Heart disease   . Hypertension    Family History  Problem Relation Age of Onset  . Cancer Mother    Past Surgical History:  Procedure Laterality Date  . ABDOMINAL HYSTERECTOMY    . APPENDECTOMY    . BREAST SURGERY     breast cancer  . TONSILLECTOMY     Patient Active Problem List   Diagnosis Date Noted  . Hypotension 01/26/2017      Prior to Admission medications   Medication Sig Start Date End Date Taking? Authorizing Provider  ADVAIR DISKUS 250-50 MCG/DOSE AEPB Inhale 1 puff into the lungs 2 (two) times daily. 12/14/16  Yes [provider]  albuterol (PROAIR HFA) 108 (90 Base) MCG/ACT inhaler Inhale 2 puffs into the lungs every 4 (four) hours as needed. 04/08/17  Yes [provider]  atorvastatin (LIPITOR) 80 MG tablet Take 80 mg by mouth daily.   Yes [provider]  diphenhydrAMINE (BENADRYL) 25 mg capsule Take 2 capsules (50 mg total) by mouth every 6 (six) hours as needed. 06/08/15  Yes Carrie Mew, MD  ferrous sulfate (EQL SLOW RELEASE IRON) 160 (50 Fe) MG TBCR SR tablet Take 1 tablet (160 mg total)  by mouth daily. 04/18/17  Yes Earleen Newport, MD  Ferrous Sulfate (SLOW FE) 142 (45 Fe) MG TBCR Take 1 tablet by mouth daily.   Yes [provider]  gabapentin (NEURONTIN) 300 MG capsule Take 300 mg by mouth at bedtime. 12/15/16  Yes [provider]  metFORMIN (GLUCOPHAGE) 500 MG tablet Take 1 tablet (500 mg total) by mouth 2 (two) times daily. 02/06/15 01/26/18 Yes Paduchowski, Lennette Bihari, MD  omeprazole (PRILOSEC) 40 MG capsule Take 40 mg by mouth daily. 01/15/17  Yes [provider]  pramipexole (MIRAPEX) 0.25 MG tablet Take 0.25 mg by mouth at bedtime. 12/23/16  Yes [provider]  traMADol (ULTRAM) 50 MG tablet Take 1 tablet (50 mg total) by mouth every 6 (six) hours as needed. 04/18/17 04/18/18 Yes Merlyn Lot, MD  triamterene-hydrochlorothiazide (MAXZIDE-25) 37.5-25 MG tablet Take 1 tablet by mouth daily.   Yes [provider]  warfarin (COUMADIN) 2.5 MG tablet Take 3.75-5 mg by mouth daily. Take 3.75MG  daily on Tuesday, Thursday and Saturday and then 5MG  daily all other days 01/21/17  Yes [provider]  diazepam (VALIUM) 5 MG tablet Take 1 tablet (5 mg total) by mouth every 8 (eight) hours as needed for muscle spasms. Patient not taking: Reported on 01/26/2017 06/08/15   Carrie Mew, MD  metoCLOPramide (REGLAN) 10 MG tablet Take 1 tablet (10 mg total) by  mouth 4 (four) times daily -  before meals and at bedtime. Patient not taking: Reported on 01/26/2017 06/08/15   Carrie Mew, MD  metoprolol tartrate (LOPRESSOR) 25 MG tablet Take 1 tablet (25 mg total) by mouth 2 (two) times daily. Take IF BP >130 (top number) Patient not taking: Reported on 10/29/2017 01/27/17   Bettey Costa, MD  naproxen (NAPROSYN) 500 MG tablet Take 1 tablet (500 mg total) by mouth 2 (two) times daily with a meal. Patient not taking: Reported on 01/26/2017 06/08/15   Carrie Mew, MD  traMADol (ULTRAM) 50 MG tablet Take 1 tablet (50 mg total) by mouth every 12  (twelve) hours as needed. Patient not taking: Reported on 10/29/2017 02/18/17   Sable Feil, PA-C    Allergies Percocet [oxycodone-acetaminophen] and Penicillins    Social History Social History   Tobacco Use  . Smoking status: Former Research scientist (life sciences)  . Smokeless tobacco: Never Used  Substance Use Topics  . Alcohol use: Yes  . Drug use: No    Review of Systems Patient denies headaches, rhinorrhea, blurry vision, numbness, shortness of breath, chest pain, edema, cough, abdominal pain, nausea, vomiting, diarrhea, dysuria, fevers, rashes or hallucinations unless otherwise stated above in HPI. ____________________________________________   PHYSICAL EXAM:  VITAL SIGNS: Vitals:   10/29/17 1326  BP: (!) 118/45  Pulse: (!) 113  Resp: 20  Temp: 98.9 F (37.2 C)  SpO2: 93%    Constitutional: Alert and oriented. Well appearing and in no acute distress. Eyes: Conjunctivae are normal.  Head: Atraumatic. Nose: No congestion/rhinnorhea. Mouth/Throat: Mucous membranes are moist.   Neck: No stridor. Painless ROM.  Cardiovascular: Normal rate, regular rhythm. Grossly normal heart sounds.  Good peripheral circulation. Respiratory: Mild tachypnea with diffuse inspiratory crackles and rhonchi on the right lung field posterior lung fields.  Prolonged expiratory phase with some wheeze noted left greater than right Gastrointestinal: Soft and nontender. No distention. No abdominal bruits. No CVA tenderness. Genitourinary:  Musculoskeletal: No lower extremity tenderness nor edema.  No joint effusions. Neurologic:  Normal speech and language. No gross focal neurologic deficits are appreciated. No facial droop Skin:  Skin is warm, dry and intact. No rash noted. Psychiatric: Mood and affect are normal. Speech and behavior are normal.  ____________________________________________   LABS (all labs ordered are listed, but only abnormal results are displayed)  Results for orders placed or performed  during the hospital encounter of 10/29/17 (from the past 24 hour(s))  Basic metabolic panel     Status: Abnormal   Collection Time: 10/29/17  1:31 PM  Result Value Ref Range   Sodium 137 135 - 145 mmol/L   Potassium 3.3 (L) 3.5 - 5.1 mmol/L   Chloride 102 101 - 111 mmol/L   CO2 24 22 - 32 mmol/L   Glucose, Bld 150 (H) 65 - 99 mg/dL   BUN 23 (H) 6 - 20 mg/dL   Creatinine, Ser 0.86 0.44 - 1.00 mg/dL   Calcium 8.7 (L) 8.9 - 10.3 mg/dL   GFR calc non Af Amer >60 >60 mL/min   GFR calc Af Amer >60 >60 mL/min   Anion gap 11 5 - 15  CBC     Status: Abnormal   Collection Time: 10/29/17  1:31 PM  Result Value Ref Range   WBC 22.7 (H) 3.6 - 11.0 K/uL   RBC 4.04 3.80 - 5.20 MIL/uL   Hemoglobin 10.1 (L) 12.0 - 16.0 g/dL   HCT 31.4 (L) 35.0 - 47.0 %   MCV 77.7 (L)  80.0 - 100.0 fL   MCH 25.0 (L) 26.0 - 34.0 pg   MCHC 32.2 32.0 - 36.0 g/dL   RDW 19.1 (H) 11.5 - 14.5 %   Platelets 289 150 - 440 K/uL  Troponin I     Status: None   Collection Time: 10/29/17  1:40 PM  Result Value Ref Range   Troponin I <0.03 <0.03 ng/mL   ____________________________________________  EKG My review and personal interpretation at Time: 13:31   Indication: sob   Rate: 130  Rhythm: sinus Axis: normal Other: nonspecific st changes, no stemi, normal intervals ____________________________________________  RADIOLOGY  I personally reviewed all radiographic images ordered to evaluate for the above acute complaints and reviewed radiology reports and findings.  These findings were personally discussed with the patient.  Please see medical record for radiology report.  ____________________________________________   PROCEDURES  Procedure(s) performed:  Procedures    Critical Care performed: no ____________________________________________   INITIAL IMPRESSION / ASSESSMENT AND PLAN / ED COURSE  Pertinent labs & imaging results that were available during my care of the patient were reviewed by me and  considered in my medical decision making (see chart for details).  DDX: Asthma, copd, CHF, pna, ptx, malignancy, Pe, anemia   Tracy Dickerson is a 77 y.o. who presents to the ED with chief complaint of shortness of breath.  Patient does have underlying lung disease.  No measured fevers but does have white count to 22,000 chest x-ray concerning for infiltrate.  Based on her symptoms and history cancer will order CT imaging to evaluate for PE worsening mass or postobstructive pneumonia.  Will give nebulizer treatments.  CT imaging shows evidence of early multifocal pneumonia versus atypical infection.  Will treat with IV antibiotics.  Patient not hypoxic but still tachycardic and does become significantly winded with even short ambulation.  Do feel patient would benefit from further medical management in the hospital given her underlying lung disease and age.  Have discussed with the patient and available family all diagnostics and treatments performed thus far and all questions were answered to the best of my ability. The patient demonstrates understanding and agreement with plan.       ____________________________________________   FINAL CLINICAL IMPRESSION(S) / ED DIAGNOSES  Final diagnoses:  Community acquired pneumonia of right upper lobe of lung ([place])  Shortness of breath      NEW MEDICATIONS STARTED DURING THIS VISIT:  New Prescriptions   No medications on file     Note:  This document was prepared using Dragon voice recognition software and may include unintentional dictation errors.    Merlyn Lot, MD 10/29/17 [phone]

## 2017-10-29 NOTE — ED Notes (Signed)
Patient transported to CT 

## 2017-10-29 NOTE — ED Notes (Signed)
Patient transported to X-ray 

## 2017-10-29 NOTE — ED Notes (Signed)
Pt ambulated in the hall. Oxygen saturation was 95-94%, pt did become tachycardic and became DOE.

## 2017-10-29 NOTE — ED Triage Notes (Signed)
Pt comes into the ED via POV c/o shortness of breath that started when she was walking [place] the court [place] to her haircut place.  Patient has h/o COPD.  Patient has even and unlabored respirations at this time and in NAD.  Denies any chest pain or dizziness.  Patient states she used one of her inhalers this morning.  Patient is not short of breath now while sitting.

## 2017-10-29 NOTE — H&P (Addendum)
[facility] [place] NAME: Tracy Dickerson    MR#:  [id number]  DATE OF BIRTH:  1941/07/27  DATE OF ADMISSION:  10/29/2017  PRIMARY CARE PHYSICIAN: Boykin Nearing, MD   REQUESTING/REFERRING PHYSICIAN: Quentin Cornwall  CHIEF COMPLAINT:   Chief Complaint  Patient presents with  . Shortness of Breath    HISTORY OF PRESENT ILLNESS:  Tracy Dickerson  is a 77 y.o. female with a known history of hypertension, diabetes, lung cancer with past lobectomy presents to the hospital due to chills, fatigue, shortness of breath.  Here patient has been found to have WBC 22 okay, right multi lobar pneumonia and sepsis.  Patient is being admitted for IV antibiotics, fluid resuscitation with sepsis. Yellow sputum.  No chest pain.  No orthopnea or lower extremity edema.  Afebrile at this time.  PAST MEDICAL HISTORY:   Past Medical History:  Diagnosis Date  . Cancer ([place])    Breast and lung  . Diabetes mellitus without complication ([place])   . GERD (gastroesophageal reflux disease)   . Heart disease   . Hypertension     PAST SURGICAL HISTORY:   Past Surgical History:  Procedure Laterality Date  . ABDOMINAL HYSTERECTOMY    . APPENDECTOMY    . BREAST SURGERY     breast cancer  . TONSILLECTOMY      SOCIAL HISTORY:   Social History   Tobacco Use  . Smoking status: Former Research scientist (life sciences)  . Smokeless tobacco: Never Used  Substance Use Topics  . Alcohol use: Yes    FAMILY HISTORY:   Family History  Problem Relation Age of Onset  . Cancer Mother     DRUG ALLERGIES:   Allergies  Allergen Reactions  . Percocet [Oxycodone-Acetaminophen]   . Penicillins Rash    REVIEW OF SYSTEMS:   Review of Systems  Constitutional: Positive for malaise/fatigue. Negative for chills and fever.  HENT: Negative for sore throat.   Eyes: Negative for blurred vision, double vision and pain.  Respiratory: Positive for cough and shortness of breath. Negative for hemoptysis and  wheezing.   Cardiovascular: Negative for chest pain, palpitations, orthopnea and leg swelling.  Gastrointestinal: Negative for abdominal pain, constipation, diarrhea, heartburn, nausea and vomiting.  Genitourinary: Negative for dysuria and hematuria.  Musculoskeletal: Negative for back pain and joint pain.  Skin: Negative for rash.  Neurological: Positive for dizziness and weakness. Negative for sensory change, speech change, focal weakness and headaches.  Endo/Heme/Allergies: Does not bruise/bleed easily.  Psychiatric/Behavioral: Negative for depression. The patient is not nervous/anxious.     MEDICATIONS AT HOME:   Prior to Admission medications   Medication Sig Start Date End Date Taking? Authorizing Provider  ADVAIR DISKUS 250-50 MCG/DOSE AEPB Inhale 1 puff into the lungs 2 (two) times daily. 12/14/16  Yes [provider]  albuterol (PROAIR HFA) 108 (90 Base) MCG/ACT inhaler Inhale 2 puffs into the lungs every 4 (four) hours as needed. 04/08/17  Yes [provider]  atorvastatin (LIPITOR) 80 MG tablet Take 80 mg by mouth daily.   Yes [provider]  diphenhydrAMINE (BENADRYL) 25 mg capsule Take 2 capsules (50 mg total) by mouth every 6 (six) hours as needed. 06/08/15  Yes Carrie Mew, MD  ferrous sulfate (EQL SLOW RELEASE IRON) 160 (50 Fe) MG TBCR SR tablet Take 1 tablet (160 mg total) by mouth daily. 04/18/17  Yes Earleen Newport, MD  Ferrous Sulfate (SLOW FE) 142 (45 Fe) MG TBCR Take 1 tablet by  mouth daily.   Yes [provider]  gabapentin (NEURONTIN) 300 MG capsule Take 300 mg by mouth at bedtime. 12/15/16  Yes [provider]  metFORMIN (GLUCOPHAGE) 500 MG tablet Take 1 tablet (500 mg total) by mouth 2 (two) times daily. 02/06/15 01/26/18 Yes Paduchowski, Lennette Bihari, MD  omeprazole (PRILOSEC) 40 MG capsule Take 40 mg by mouth daily. 01/15/17  Yes [provider]  pramipexole (MIRAPEX) 0.25 MG tablet Take 0.25 mg by mouth at  bedtime. 12/23/16  Yes [provider]  traMADol (ULTRAM) 50 MG tablet Take 1 tablet (50 mg total) by mouth every 6 (six) hours as needed. 04/18/17 04/18/18 Yes Merlyn Lot, MD  triamterene-hydrochlorothiazide (MAXZIDE-25) 37.5-25 MG tablet Take 1 tablet by mouth daily.   Yes [provider]  warfarin (COUMADIN) 2.5 MG tablet Take 3.75-5 mg by mouth daily. Take 3.75MG  daily on Tuesday, Thursday and Saturday and then 5MG  daily all other days 01/21/17  Yes [provider]  diazepam (VALIUM) 5 MG tablet Take 1 tablet (5 mg total) by mouth every 8 (eight) hours as needed for muscle spasms. Patient not taking: Reported on 01/26/2017 06/08/15   Carrie Mew, MD  metoCLOPramide (REGLAN) 10 MG tablet Take 1 tablet (10 mg total) by mouth 4 (four) times daily -  before meals and at bedtime. Patient not taking: Reported on 01/26/2017 06/08/15   Carrie Mew, MD  metoprolol tartrate (LOPRESSOR) 25 MG tablet Take 1 tablet (25 mg total) by mouth 2 (two) times daily. Take IF BP >130 (top number) Patient not taking: Reported on 10/29/2017 01/27/17   Bettey Costa, MD  naproxen (NAPROSYN) 500 MG tablet Take 1 tablet (500 mg total) by mouth 2 (two) times daily with a meal. Patient not taking: Reported on 01/26/2017 06/08/15   Carrie Mew, MD  traMADol (ULTRAM) 50 MG tablet Take 1 tablet (50 mg total) by mouth every 12 (twelve) hours as needed. Patient not taking: Reported on 10/29/2017 02/18/17   Sable Feil, PA-C     VITAL SIGNS:  Blood pressure (!) 118/45, pulse (!) 113, temperature 98.9 F (37.2 C), temperature source Oral, resp. rate 20, height 5\' 6"  (1.676 m), weight 116.1 kg (256 lb), SpO2 93 %.  PHYSICAL EXAMINATION:  Physical Exam  GENERAL:  77 y.o.-year-old patient lying in the bed .  Obese EYES: Pupils equal, round, reactive to light and accommodation. No scleral icterus. Extraocular muscles intact.  HEENT: Head atraumatic, normocephalic. Oropharynx and nasopharynx  clear. No oropharyngeal erythema, moist oral mucosa  NECK:  Supple, no jugular venous distention. No thyroid enlargement, no tenderness.  LUNGS: Normal work of breathing.  No wheezing.  Right-sided rhonchi. CARDIOVASCULAR: S1, S2 normal. No murmurs, rubs, or gallops.  ABDOMEN: Soft, nontender, nondistended. Bowel sounds present. No organomegaly or mass.  EXTREMITIES: No pedal edema, cyanosis, or clubbing. + 2 pedal & radial pulses b/l.  NEUROLOGIC: Cranial nerves II through XII are intact. No focal Motor or sensory deficits appreciated b/l. PSYCHIATRIC: The patient is alert and oriented x 3. Good affect.  SKIN: No obvious rash, lesion, or ulcer.   LABORATORY PANEL:   CBC Recent Labs  Lab 10/29/17 1331  WBC 22.7*  HGB 10.1*  HCT 31.4*  PLT 289   ------------------------------------------------------------------------------------------------------------------  Chemistries  Recent Labs  Lab 10/29/17 1331  NA 137  K 3.3*  CL 102  CO2 24  GLUCOSE 150*  BUN 23*  CREATININE 0.86  CALCIUM 8.7*   ------------------------------------------------------------------------------------------------------------------  Cardiac Enzymes Recent Labs  Lab 10/29/17 1340  TROPONINI <0.03   ------------------------------------------------------------------------------------------------------------------  RADIOLOGY:  Dg Chest 2 View  Result Date: 10/29/2017 CLINICAL DATA:  Shortness of breath. COPD. History of breast cancer. EXAM: CHEST  2 VIEW COMPARISON:  01/26/2017 FINDINGS: The cardiac silhouette remains mildly enlarged. Aortic atherosclerosis is noted. There is slight elevation of the right hemidiaphragm which is chronic. Interstitial coarsening and peribronchial thickening are greater than on the prior study, again with asymmetric greater involvement of the right lung where there is now also nodularity including in the upper lobe and peripheral midlung. Chronic pleural thickening is  present laterally in the right chest. No layering pleural effusion is seen posteriorly. No pneumothorax is identified. Surgical clips project over the left breast and axilla. IMPRESSION: Chronic lung disease with increased diffuse interstitial coarsening and right lung nodularity. This may represent superimposed acute (potentially typical) infection. Interval worsening of chronic interstitial disease is also possible, and lymphangitic tumor is not excluded. Electronically Signed   By: Logan Bores M.D.   On: 10/29/2017 14:43   Ct Chest W Contrast  Result Date: 10/29/2017 CLINICAL DATA:  Pt in via EMS, Pt was walking home from the bus stop and pt became short of breath. Pt advised that this is normal for her when she walks long distances. EXAM: CT CHEST WITH CONTRAST TECHNIQUE: Multidetector CT imaging of the chest was performed during intravenous contrast administration. CONTRAST:  54mL ISOVUE-300 IOPAMIDOL (ISOVUE-300) INJECTION 61% COMPARISON:  Chest radiograph 10/29/2017 40 years is No significant vascular findings. Normal heart size. No pericardial effusion. FINDINGS: Cardiovascular: No significant vascular findings. Normal heart size. No pericardial effusion. Mediastinum/Nodes: No no axillary supraclavicular adenopathy. No mediastinal hilar adenopathy. No pericardial fluid. Esophagus normal Lungs/Pleura: Multifocal airspace disease in the RIGHT upper lobe. There are small cavitary/ring shaped foci of airspace disease. Mild subpleural reticulation in the RIGHT lower lobe and mild nodular airspace disease. LEFT lung is relatively free of the nodular airspace disease. Upper Abdomen: Limited view of the liver, kidneys, pancreas are unremarkable. Normal adrenal glands. Musculoskeletal: No aggressive osseous lesion. IMPRESSION: 1. Nodular airspace disease in the RIGHT upper lobe and to lesser degree the RIGHT lower lobe in a ring shaped/cavitary pattern lobe is consistent with multifocal pulmonary infection.  Consider bacterial and atypical infections etiologies. 2. Mild peripheral chronic interstitial lung disease. Electronically Signed   By: Suzy Bouchard M.D.   On: 10/29/2017 16:54     IMPRESSION AND PLAN:   *Right middle and lower lobe pneumonia with sepsis present on admission Bolus  normal saline stat.  Repeat lactic acid in 3 hours. Start IV Levaquin.  Not needing oxygen.  Repeat labs in the morning.  *History of lung cancer with right lobectomy.  Patient will need repeat imaging as outpatient with primary care physician to rule out recurrence of carcinoma once pneumonia resolves.  *Diabetes mellitus.  Sliding scale insulin.  Hold metformin.  *Hypertension.  Continue home medications.  *DVT prophylaxis with Lovenox  All the records are reviewed and case discussed with ED provider. Management plans discussed with the patient, family and they are in agreement.  CODE STATUS: DNR  TOTAL CC TIME TAKING CARE OF THIS PATIENT: 40 minutes.   Leia Alf Marvalene Barrett M.D on 10/29/2017 at 6:17 PM  Between 7am to 6pm - Pager - [phone]  After 6pm go to [email or web address] - password [email or web address] [facility]  Office  [phone]  CC: Primary care physician; Boykin Nearing, MD  Note: This dictation was prepared with Dragon dictation along with smaller phrase  technology. Any transcriptional errors that result from this process are unintentional.

## 2017-10-30 ENCOUNTER — Other Ambulatory Visit: Payer: Self-pay

## 2017-10-30 DIAGNOSIS — A419 Sepsis, unspecified organism: Secondary | ICD-10-CM | POA: Diagnosis not present

## 2017-10-30 DIAGNOSIS — R0602 Shortness of breath: Secondary | ICD-10-CM | POA: Diagnosis not present

## 2017-10-30 LAB — CBC
HCT: 28 % — ABNORMAL LOW (ref 35.0–47.0)
Hemoglobin: 8.9 g/dL — ABNORMAL LOW (ref 12.0–16.0)
MCH: 26.3 pg (ref 26.0–34.0)
MCHC: 31.9 g/dL — ABNORMAL LOW (ref 32.0–36.0)
MCV: 82.4 fL (ref 80.0–100.0)
Platelets: 227 10*3/uL (ref 150–440)
RBC: 3.4 MIL/uL — ABNORMAL LOW (ref 3.80–5.20)
RDW: 19.4 % — ABNORMAL HIGH (ref 11.5–14.5)
WBC: 18.6 10*3/uL — ABNORMAL HIGH (ref 3.6–11.0)

## 2017-10-30 LAB — PROTIME-INR
INR: 1.75
Prothrombin Time: 20.3 seconds — ABNORMAL HIGH (ref 11.4–15.2)

## 2017-10-30 LAB — BASIC METABOLIC PANEL
Anion gap: 8 (ref 5–15)
BUN: 19 mg/dL (ref 6–20)
CO2: 25 mmol/L (ref 22–32)
Calcium: 8.4 mg/dL — ABNORMAL LOW (ref 8.9–10.3)
Chloride: 107 mmol/L (ref 101–111)
Creatinine, Ser: 0.72 mg/dL (ref 0.44–1.00)
GFR calc Af Amer: >60 mL/min (ref >60)
GFR calc non Af Amer: >60 mL/min (ref >60)
Glucose, Bld: 140 mg/dL — ABNORMAL HIGH (ref 65–99)
Potassium: 3.1 mmol/L — ABNORMAL LOW (ref 3.5–5.1)
Sodium: 140 mmol/L (ref 135–145)

## 2017-10-30 LAB — GLUCOSE, CAPILLARY
Glucose-Capillary: 156 mg/dL — ABNORMAL HIGH (ref 65–99)
Glucose-Capillary: 156 mg/dL — ABNORMAL HIGH (ref 65–99)
Glucose-Capillary: 162 mg/dL — ABNORMAL HIGH (ref 65–99)
Glucose-Capillary: 117 mg/dL — ABNORMAL HIGH (ref 65–99)

## 2017-10-30 LAB — MRSA PCR SCREENING: MRSA by PCR: NEGATIVE

## 2017-10-30 LAB — LACTIC ACID, PLASMA: Lactic Acid, Venous: 1.4 mmol/L (ref 0.5–1.9)

## 2017-10-30 MED ORDER — WARFARIN SODIUM 3 MG PO TABS
3.5000 mg | ORAL_TABLET | ORAL | Status: DC
  Administered 2017-10-30: 18:00:00 3.5 mg via ORAL
  Filled 2017-10-30: qty 1

## 2017-10-30 MED ORDER — WARFARIN SODIUM 5 MG PO TABS: 5.0000 mg | ORAL_TABLET | ORAL | Status: DC

## 2017-10-30 MED ORDER — WARFARIN - PHARMACIST DOSING INPATIENT
Freq: Every day | Status: DC
  Administered 2017-10-30: 18:00:00

## 2017-10-30 NOTE — Progress Notes (Signed)
ANTICOAGULATION CONSULT NOTE - Initial Consult  Pharmacy Consult for warfarin Indication: atrial fibrillation  Allergies  Allergen Reactions  . Percocet [Oxycodone-Acetaminophen]   . Penicillins Rash    Patient Measurements: Height: 5\' 6"  (167.6 cm) Weight: 248 lb 11.2 oz (112.8 kg) IBW/kg (Calculated) : 59.3 Heparin Dosing Weight:   Vital Signs: Temp: 97.6 F (36.4 C) (02/07 0550) Temp Source: Oral (02/07 0550) BP: 143/60 (02/07 0924) Pulse Rate: 93 (02/07 0924)  Labs: Recent Labs    10/29/17 1331 10/29/17 1340 10/30/17 0306 10/30/17 1255  HGB 10.1*  --  8.9*  --   HCT 31.4*  --  28.0*  --   PLT 289  --  227  --   LABPROT  --   --   --  20.3*  INR  --   --   --  1.75  CREATININE 0.86  --  0.72  --   TROPONINI  --  <0.03  --   --     Estimated Creatinine Clearance: 78.6 mL/min (by C-G formula based on SCr of 0.72 mg/dL).   Medical History: Past Medical History:  Diagnosis Date  . Cancer ([place])    Breast and lung  . Diabetes mellitus without complication ([place])   . GERD (gastroesophageal reflux disease)   . Heart disease   . Hypertension     Medications:  Medications Prior to Admission  Medication Sig Dispense Refill Last Dose  . ADVAIR DISKUS 250-50 MCG/DOSE AEPB Inhale 1 puff into the lungs 2 (two) times daily.   10/29/2017 at AM  . albuterol (PROAIR HFA) 108 (90 Base) MCG/ACT inhaler Inhale 2 puffs into the lungs every 4 (four) hours as needed.   PRN at PRN  . atorvastatin (LIPITOR) 80 MG tablet Take 80 mg by mouth daily.   10/28/2017 at PM  . diphenhydrAMINE (BENADRYL) 25 mg capsule Take 2 capsules (50 mg total) by mouth every 6 (six) hours as needed. 60 capsule 0 PRN at PRN  . ferrous sulfate (EQL SLOW RELEASE IRON) 160 (50 Fe) MG TBCR SR tablet Take 1 tablet (160 mg total) by mouth daily. 30 each 6 10/29/2017 at AM  . Ferrous Sulfate (SLOW FE) 142 (45 Fe) MG TBCR Take 1 tablet by mouth daily.   10/29/2017 at AM  . gabapentin (NEURONTIN) 300 MG capsule Take  300 mg by mouth at bedtime.   10/28/2017 at PM  . metFORMIN (GLUCOPHAGE) 500 MG tablet Take 1 tablet (500 mg total) by mouth 2 (two) times daily. 60 tablet 1 10/29/2017 at AM  . omeprazole (PRILOSEC) 40 MG capsule Take 40 mg by mouth daily.   10/29/2017 at AM  . pramipexole (MIRAPEX) 0.25 MG tablet Take 0.25 mg by mouth at bedtime.   10/28/2017 at PM  . traMADol (ULTRAM) 50 MG tablet Take 1 tablet (50 mg total) by mouth every 6 (six) hours as needed. 6 tablet 0 PRN at PRN  . triamterene-hydrochlorothiazide (MAXZIDE-25) 37.5-25 MG tablet Take 1 tablet by mouth daily.   10/29/2017 at AM  . warfarin (COUMADIN) 2.5 MG tablet Take 3.75-5 mg by mouth daily. Take 3.75MG  daily on Tuesday, Thursday and Saturday and then 5MG  daily all other days  0 10/29/2017 at AM  . diazepam (VALIUM) 5 MG tablet Take 1 tablet (5 mg total) by mouth every 8 (eight) hours as needed for muscle spasms. (Patient not taking: Reported on 01/26/2017) 8 tablet 0 -- at --  . metoCLOPramide (REGLAN) 10 MG tablet Take 1 tablet (10 mg total)  by mouth 4 (four) times daily -  before meals and at bedtime. (Patient not taking: Reported on 01/26/2017) 60 tablet 0 -- at --  . metoprolol tartrate (LOPRESSOR) 25 MG tablet Take 1 tablet (25 mg total) by mouth 2 (two) times daily. Take IF BP >130 (top number) (Patient not taking: Reported on 10/29/2017)   -- at --  . naproxen (NAPROSYN) 500 MG tablet Take 1 tablet (500 mg total) by mouth 2 (two) times daily with a meal. (Patient not taking: Reported on 01/26/2017) 20 tablet 0 -- at --  . traMADol (ULTRAM) 50 MG tablet Take 1 tablet (50 mg total) by mouth every 12 (twelve) hours as needed. (Patient not taking: Reported on 10/29/2017) 12 tablet 0 -- at --   Scheduled:  . insulin aspart  0-5 Units Subcutaneous QHS  . insulin aspart  0-9 Units Subcutaneous TID WC  . nystatin  5 mL Mouth/Throat QID    Assessment: Pharmacy consulted to dose and monitor warfarin in this 77 year old woman on warfarin for afib. Home dose:  3.75 (tue, thur and sat) and 5 mg all other days.  Goal of Therapy:  INR 2-3 Monitor platelets by anticoagulation protocol: Yes   Plan:  Will continue home regimen of warfarin.   Tracy Dickerson D 10/30/2017,2:25 PM

## 2017-10-30 NOTE — Progress Notes (Signed)
[facility] [place] NAME: Tracy Dickerson    MR#:  [id number]  DATE OF BIRTH:  09-Mar-1941  SUBJECTIVE: Admitted for shortness of breath, pneumonia.  Started on IV antibiotics, today she feels much better than yesterday, WBC also down.  No hypoxia.  Able to ambulate to the bathroom. less Shortness of breath.  CHIEF COMPLAINT:   Chief Complaint  Patient presents with  . Shortness of Breath    REVIEW OF SYSTEMS:   ROS CONSTITUTIONAL: No fever, fatigue or weakness.  EYES: No blurred or double vision.  EARS, NOSE, AND THROAT: No tinnitus or ear pain.  RESPIRATORY: No cough, shortness of breath, wheezing or hemoptysis.  CARDIOVASCULAR: No chest pain, orthopnea, edema.  GASTROINTESTINAL: No nausea, vomiting, diarrhea or abdominal pain.  GENITOURINARY: No dysuria, hematuria.  ENDOCRINE: No polyuria, nocturia,  HEMATOLOGY: No anemia, easy bruising or bleeding SKIN: No rash or lesion. MUSCULOSKELETAL: No joint pain or arthritis.   NEUROLOGIC: No tingling, numbness, weakness.  PSYCHIATRY: No anxiety or depression.   DRUG ALLERGIES:   Allergies  Allergen Reactions  . Percocet [Oxycodone-Acetaminophen]   . Penicillins Rash    VITALS:  Blood pressure (!) 143/60, pulse 93, temperature 97.6 F (36.4 C), temperature source Oral, resp. rate 18, height 5\' 6"  (1.676 m), weight 112.8 kg (248 lb 11.2 oz), SpO2 94 %.  PHYSICAL EXAMINATION:  GENERAL:  77 y.o.-year-old patient lying in the bed with no acute distress.  EYES: Pupils equal, round, reactive to light and accommodation. No scleral icterus. Extraocular muscles intact.  HEENT: Head atraumatic, normocephalic. Oropharynx and nasopharynx clear.  NECK:  Supple, no jugular venous distention. No thyroid enlargement, no tenderness.  LUNGS: Normal breath sounds bilaterally, no wheezing, rales,rhonchi or crepitation. No use of accessory muscles of respiration.  CARDIOVASCULAR: S1, S2 normal. No  murmurs, rubs, or gallops.  ABDOMEN: Soft, nontender, nondistended. Bowel sounds present. No organomegaly or mass.  EXTREMITIES: No pedal edema, cyanosis, or clubbing.  NEUROLOGIC: Cranial nerves II through XII are intact. Muscle strength 5/5 in all extremities. Sensation intact. Gait not checked.  PSYCHIATRIC: The patient is alert and oriented x 3.  SKIN: No obvious rash, lesion, or ulcer.    LABORATORY PANEL:   CBC Recent Labs  Lab 10/30/17 0306  WBC 18.6*  HGB 8.9*  HCT 28.0*  PLT 227   ------------------------------------------------------------------------------------------------------------------  Chemistries  Recent Labs  Lab 10/30/17 0306  NA 140  K 3.1*  CL 107  CO2 25  GLUCOSE 140*  BUN 19  CREATININE 0.72  CALCIUM 8.4*   ------------------------------------------------------------------------------------------------------------------  Cardiac Enzymes Recent Labs  Lab 10/29/17 1340  TROPONINI <0.03   ------------------------------------------------------------------------------------------------------------------  RADIOLOGY:  Dg Chest 2 View  Result Date: 10/29/2017 CLINICAL DATA:  Shortness of breath. COPD. History of breast cancer. EXAM: CHEST  2 VIEW COMPARISON:  01/26/2017 FINDINGS: The cardiac silhouette remains mildly enlarged. Aortic atherosclerosis is noted. There is slight elevation of the right hemidiaphragm which is chronic. Interstitial coarsening and peribronchial thickening are greater than on the prior study, again with asymmetric greater involvement of the right lung where there is now also nodularity including in the upper lobe and peripheral midlung. Chronic pleural thickening is present laterally in the right chest. No layering pleural effusion is seen posteriorly. No pneumothorax is identified. Surgical clips project over the left breast and axilla. IMPRESSION: Chronic lung disease with increased diffuse interstitial coarsening and right  lung nodularity. This may represent superimposed acute (potentially typical) infection. Interval worsening of  chronic interstitial disease is also possible, and lymphangitic tumor is not excluded. Electronically Signed   By: Logan Bores M.D.   On: 10/29/2017 14:43   Ct Chest W Contrast  Result Date: 10/29/2017 CLINICAL DATA:  Pt in via EMS, Pt was walking home from the bus stop and pt became short of breath. Pt advised that this is normal for her when she walks long distances. EXAM: CT CHEST WITH CONTRAST TECHNIQUE: Multidetector CT imaging of the chest was performed during intravenous contrast administration. CONTRAST:  4mL ISOVUE-300 IOPAMIDOL (ISOVUE-300) INJECTION 61% COMPARISON:  Chest radiograph 10/29/2017 40 years is No significant vascular findings. Normal heart size. No pericardial effusion. FINDINGS: Cardiovascular: No significant vascular findings. Normal heart size. No pericardial effusion. Mediastinum/Nodes: No no axillary supraclavicular adenopathy. No mediastinal hilar adenopathy. No pericardial fluid. Esophagus normal Lungs/Pleura: Multifocal airspace disease in the RIGHT upper lobe. There are small cavitary/ring shaped foci of airspace disease. Mild subpleural reticulation in the RIGHT lower lobe and mild nodular airspace disease. LEFT lung is relatively free of the nodular airspace disease. Upper Abdomen: Limited view of the liver, kidneys, pancreas are unremarkable. Normal adrenal glands. Musculoskeletal: No aggressive osseous lesion. IMPRESSION: 1. Nodular airspace disease in the RIGHT upper lobe and to lesser degree the RIGHT lower lobe in a ring shaped/cavitary pattern lobe is consistent with multifocal pulmonary infection. Consider bacterial and atypical infections etiologies. 2. Mild peripheral chronic interstitial lung disease. Electronically Signed   By: Suzy Bouchard M.D.   On: 10/29/2017 16:54    EKG:   Orders placed or performed during the hospital encounter of 10/29/17  .  ED EKG  . ED EKG    ASSESSMENT AND PLAN:   #1. right middle lobe pneumonia and sepsis present on admission: Continue IV antibiotics, IV fluids, WBC coming down.  P.o. intake is also improving, no shortness of breath, likely discharge home tomorrow. 2.  History of lung cancer, right lobectomy: Patient needs repeat chest x-ray as an outpatient to rule out recurrence of carcinoma once the pneumonia resolves. 3. diabetes mellitus type 2: Continue sliding scale coverage, 4.htn;controlled   All the records are reviewed and case discussed with Care Management/Social Workerr. Management plans discussed with the patient, family and they are in agreement.  CODE STATUS: full  TOTAL TIME TAKING CARE OF THIS PATIENT:6minutes.   POSSIBLE D/C IN 1-2 DAYS, DEPENDING ON CLINICAL CONDITION.   Epifanio Lesches M.D on 10/30/2017 at 2:20 PM  Between 7am to 6pm - Pager - [phone]  After 6pm go to [email or web address] - password [email or web address] [facility]  Office  [phone]  CC: Primary care physician; Boykin Nearing, MD   Note: This dictation was prepared with Dragon dictation along with smaller phrase technology. Any transcriptional errors that result from this process are unintentional.

## 2017-10-31 DIAGNOSIS — R0602 Shortness of breath: Secondary | ICD-10-CM | POA: Diagnosis not present

## 2017-10-31 DIAGNOSIS — A419 Sepsis, unspecified organism: Secondary | ICD-10-CM | POA: Diagnosis not present

## 2017-10-31 LAB — URINE CULTURE: Culture: NO GROWTH

## 2017-10-31 LAB — PROTIME-INR
INR: 1.58
Prothrombin Time: 18.7 seconds — ABNORMAL HIGH (ref 11.4–15.2)

## 2017-10-31 LAB — GLUCOSE, CAPILLARY
Glucose-Capillary: 133 mg/dL — ABNORMAL HIGH (ref 65–99)
Glucose-Capillary: 148 mg/dL — ABNORMAL HIGH (ref 65–99)
Glucose-Capillary: 124 mg/dL — ABNORMAL HIGH (ref 65–99)

## 2017-10-31 MED ORDER — LEVOFLOXACIN 500 MG PO TABS: 500.0000 mg | ORAL_TABLET | Freq: Every day | ORAL | 0 refills | Status: AC

## 2017-10-31 MED ORDER — WARFARIN SODIUM 7.5 MG PO TABS
7.5000 mg | ORAL_TABLET | Freq: Once | ORAL | Status: DC
  Filled 2017-10-31: qty 1

## 2017-10-31 MED ORDER — GUAIFENESIN-DM 100-10 MG/5ML PO SYRP
5.0000 mL | ORAL_SOLUTION | ORAL | Status: DC | PRN
  Administered 2017-10-31: 5 mL via ORAL
  Filled 2017-10-31 (×3): qty 5

## 2017-10-31 MED ORDER — WARFARIN SODIUM 5 MG PO TABS: 5.0000 mg | ORAL_TABLET | ORAL | Status: DC

## 2017-10-31 NOTE — Progress Notes (Signed)
ANTIBIOTIC CONSULT NOTE - INITIAL  Pharmacy Consult for Levaquin Indication: pneumonia  Allergies  Allergen Reactions  . Percocet [Oxycodone-Acetaminophen]   . Penicillins Rash    Patient Measurements: Height: 5\' 6"  (167.6 cm) Weight: 246 lb (111.6 kg) IBW/kg (Calculated) : 59.3 Adjusted Body Weight:   Vital Signs: Temp: 97.7 F (36.5 C) (02/08 0431) Temp Source: Oral (02/08 0431) BP: 121/76 (02/08 0431) Pulse Rate: 93 (02/08 0431) Intake/Output from previous day: 02/07 0701 - 02/08 0700 In: 390 [P.O.:240; IV Piggyback:150] Out: 700 [Urine:700] Intake/Output from this shift: No intake/output data recorded.  Labs: Recent Labs    10/29/17 1331 10/30/17 0306  WBC 22.7* 18.6*  HGB 10.1* 8.9*  PLT 289 227  CREATININE 0.86 0.72   Estimated Creatinine Clearance: 78.1 mL/min (by C-G formula based on SCr of 0.72 mg/dL). No results for input(s): VANCOTROUGH, VANCOPEAK, VANCORANDOM, GENTTROUGH, GENTPEAK, GENTRANDOM, TOBRATROUGH, TOBRAPEAK, TOBRARND, AMIKACINPEAK, AMIKACINTROU, AMIKACIN in the last 72 hours.   Microbiology: Recent Results (from the past 720 hour(s))  Blood Culture (routine x 2)     Status: None (Preliminary result)   Collection Time: 10/29/17  4:59 PM  Result Value Ref Range Status   Specimen Description BLOOD RAC  Final   Special Requests   Final    BOTTLES DRAWN AEROBIC AND ANAEROBIC Blood Culture adequate volume   Culture   Final    NO GROWTH 2 DAYS Performed at [facility], [address] [place], [place] [address]    Report Status PENDING  Incomplete  Blood Culture (routine x 2)     Status: None (Preliminary result)   Collection Time: 10/29/17  5:04 PM  Result Value Ref Range Status   Specimen Description BLOOD LEFT HAND  Final   Special Requests   Final    BOTTLES DRAWN AEROBIC AND ANAEROBIC Blood Culture adequate volume   Culture   Final    NO GROWTH 2 DAYS Performed at [facility], [address], [place], [place]  [address]    Report Status PENDING  Incomplete  MRSA PCR Screening     Status: None   Collection Time: 10/29/17 11:40 PM  Result Value Ref Range Status   MRSA by PCR NEGATIVE NEGATIVE Final    Comment:        The GeneXpert MRSA Assay (FDA approved for NASAL specimens only), is one component of a comprehensive MRSA colonization surveillance program. It is not intended to diagnose MRSA infection nor to guide or monitor treatment for MRSA infections. Performed at [facility], [address] [place], [place] [address]     Medical History: Past Medical History:  Diagnosis Date  . Cancer ([place])    Breast and lung  . Diabetes mellitus without complication ([place])   . GERD (gastroesophageal reflux disease)   . Heart disease   . Hypertension     Medications:  Medications Prior to Admission  Medication Sig Dispense Refill Last Dose  . ADVAIR DISKUS 250-50 MCG/DOSE AEPB Inhale 1 puff into the lungs 2 (two) times daily.   10/29/2017 at AM  . albuterol (PROAIR HFA) 108 (90 Base) MCG/ACT inhaler Inhale 2 puffs into the lungs every 4 (four) hours as needed.   PRN at PRN  . atorvastatin (LIPITOR) 80 MG tablet Take 80 mg by mouth daily.   10/28/2017 at PM  . diphenhydrAMINE (BENADRYL) 25 mg capsule Take 2 capsules (50 mg total) by mouth every 6 (six) hours as needed. 60 capsule 0 PRN at PRN  . ferrous  sulfate (EQL SLOW RELEASE IRON) 160 (50 Fe) MG TBCR SR tablet Take 1 tablet (160 mg total) by mouth daily. 30 each 6 10/29/2017 at AM  . Ferrous Sulfate (SLOW FE) 142 (45 Fe) MG TBCR Take 1 tablet by mouth daily.   10/29/2017 at AM  . gabapentin (NEURONTIN) 300 MG capsule Take 300 mg by mouth at bedtime.   10/28/2017 at PM  . metFORMIN (GLUCOPHAGE) 500 MG tablet Take 1 tablet (500 mg total) by mouth 2 (two) times daily. 60 tablet 1 10/29/2017 at AM  . omeprazole (PRILOSEC) 40 MG capsule Take 40 mg by mouth daily.   10/29/2017 at AM  . pramipexole (MIRAPEX) 0.25 MG tablet Take 0.25 mg by mouth at  bedtime.   10/28/2017 at PM  . traMADol (ULTRAM) 50 MG tablet Take 1 tablet (50 mg total) by mouth every 6 (six) hours as needed. 6 tablet 0 PRN at PRN  . triamterene-hydrochlorothiazide (MAXZIDE-25) 37.5-25 MG tablet Take 1 tablet by mouth daily.   10/29/2017 at AM  . warfarin (COUMADIN) 2.5 MG tablet Take 3.75-5 mg by mouth daily. Take 3.75MG  daily on Tuesday, Thursday and Saturday and then 5MG  daily all other days  0 10/29/2017 at AM  . diazepam (VALIUM) 5 MG tablet Take 1 tablet (5 mg total) by mouth every 8 (eight) hours as needed for muscle spasms. (Patient not taking: Reported on 01/26/2017) 8 tablet 0 -- at --  . metoCLOPramide (REGLAN) 10 MG tablet Take 1 tablet (10 mg total) by mouth 4 (four) times daily -  before meals and at bedtime. (Patient not taking: Reported on 01/26/2017) 60 tablet 0 -- at --  . metoprolol tartrate (LOPRESSOR) 25 MG tablet Take 1 tablet (25 mg total) by mouth 2 (two) times daily. Take IF BP >130 (top number) (Patient not taking: Reported on 10/29/2017)   -- at --  . naproxen (NAPROSYN) 500 MG tablet Take 1 tablet (500 mg total) by mouth 2 (two) times daily with a meal. (Patient not taking: Reported on 01/26/2017) 20 tablet 0 -- at --  . traMADol (ULTRAM) 50 MG tablet Take 1 tablet (50 mg total) by mouth every 12 (twelve) hours as needed. (Patient not taking: Reported on 10/29/2017) 12 tablet 0 -- at --   Scheduled:  . insulin aspart  0-5 Units Subcutaneous QHS  . insulin aspart  0-9 Units Subcutaneous TID WC  . nystatin  5 mL Mouth/Throat QID  . warfarin  3.5 mg Oral Once per day on Tue Thu Sat  . [START ON 11/01/2017] warfarin  5 mg Oral Once per day on Sun Mon Wed Fri  . warfarin  7.5 mg Oral ONCE-1800  . Warfarin - Pharmacist Dosing Inpatient   Does not apply q1800   Assessment: Pharmacy consulted to dose and monitor levofloxacin in this 77 year old woman.   Goal of Therapy:    Plan:  Will continue levofloxacin 750 mg IV q24 hours.   Lyle Niblett D 10/31/2017,8:26 AM

## 2017-10-31 NOTE — Care Management Important Message (Signed)
Important Message  Patient Details  Name: NYREE APPLEGATE MRN: [id number] Date of Birth: 10/26/1940   Medicare Important Message Given:  Yes    Shelbie Ammons, RN 10/31/2017, 6:41 AM

## 2017-10-31 NOTE — Progress Notes (Signed)
Pt being discharged home, discharge instructions reviewed with pt and brother (guardian), states understanding, pt with no complaints, home meds returned to pt

## 2017-10-31 NOTE — Plan of Care (Signed)
  Activity: Ability to tolerate increased activity will improve 10/31/2017 0733 - Progressing by Jeffie Pollock, RN   Clinical Measurements: Ability to maintain a body temperature in the normal range will improve 10/31/2017 0733 - Progressing by Jeffie Pollock, RN   Respiratory: Ability to maintain adequate ventilation will improve 10/31/2017 0733 - Progressing by Jeffie Pollock, RN Ability to maintain a clear airway will improve 10/31/2017 0733 - Progressing by Jeffie Pollock, RN   Education: Knowledge of General Education information will improve 10/31/2017 [phone] - Progressing by Jeffie Pollock, RN   Safety: Ability to remain free from injury will improve 10/31/2017 0733 - Progressing by Jeffie Pollock, RN   Skin Integrity: Risk for impaired skin integrity will decrease 10/31/2017 0733 - Progressing by Jeffie Pollock, RN

## 2017-10-31 NOTE — Progress Notes (Addendum)
ANTICOAGULATION CONSULT NOTE - FOLLOW UP Consult  Pharmacy Consult for warfarin Indication: atrial fibrillation  Allergies  Allergen Reactions  . Percocet [Oxycodone-Acetaminophen]   . Penicillins Rash    Patient Measurements: Height: 5\' 6"  (167.6 cm) Weight: 246 lb (111.6 kg) IBW/kg (Calculated) : 59.3 Heparin Dosing Weight:   Vital Signs: Temp: 97.7 F (36.5 C) (02/08 0431) Temp Source: Oral (02/08 0431) BP: 121/76 (02/08 0431) Pulse Rate: 93 (02/08 0431)  Labs: Recent Labs    10/29/17 1331 10/29/17 1340 10/30/17 0306 10/30/17 1255 10/31/17 0505  HGB 10.1*  --  8.9*  --   --   HCT 31.4*  --  28.0*  --   --   PLT 289  --  227  --   --   LABPROT  --   --   --  20.3* 18.7*  INR  --   --   --  1.75 1.58  CREATININE 0.86  --  0.72  --   --   TROPONINI  --  <0.03  --   --   --     Estimated Creatinine Clearance: 78.1 mL/min (by C-G formula based on SCr of 0.72 mg/dL).   Medical History: Past Medical History:  Diagnosis Date  . Cancer ([place])    Breast and lung  . Diabetes mellitus without complication ([place])   . GERD (gastroesophageal reflux disease)   . Heart disease   . Hypertension     Medications:  Medications Prior to Admission  Medication Sig Dispense Refill Last Dose  . ADVAIR DISKUS 250-50 MCG/DOSE AEPB Inhale 1 puff into the lungs 2 (two) times daily.   10/29/2017 at AM  . albuterol (PROAIR HFA) 108 (90 Base) MCG/ACT inhaler Inhale 2 puffs into the lungs every 4 (four) hours as needed.   PRN at PRN  . atorvastatin (LIPITOR) 80 MG tablet Take 80 mg by mouth daily.   10/28/2017 at PM  . diphenhydrAMINE (BENADRYL) 25 mg capsule Take 2 capsules (50 mg total) by mouth every 6 (six) hours as needed. 60 capsule 0 PRN at PRN  . ferrous sulfate (EQL SLOW RELEASE IRON) 160 (50 Fe) MG TBCR SR tablet Take 1 tablet (160 mg total) by mouth daily. 30 each 6 10/29/2017 at AM  . Ferrous Sulfate (SLOW FE) 142 (45 Fe) MG TBCR Take 1 tablet by mouth daily.   10/29/2017 at AM  .  gabapentin (NEURONTIN) 300 MG capsule Take 300 mg by mouth at bedtime.   10/28/2017 at PM  . metFORMIN (GLUCOPHAGE) 500 MG tablet Take 1 tablet (500 mg total) by mouth 2 (two) times daily. 60 tablet 1 10/29/2017 at AM  . omeprazole (PRILOSEC) 40 MG capsule Take 40 mg by mouth daily.   10/29/2017 at AM  . pramipexole (MIRAPEX) 0.25 MG tablet Take 0.25 mg by mouth at bedtime.   10/28/2017 at PM  . traMADol (ULTRAM) 50 MG tablet Take 1 tablet (50 mg total) by mouth every 6 (six) hours as needed. 6 tablet 0 PRN at PRN  . triamterene-hydrochlorothiazide (MAXZIDE-25) 37.5-25 MG tablet Take 1 tablet by mouth daily.   10/29/2017 at AM  . warfarin (COUMADIN) 2.5 MG tablet Take 3.75-5 mg by mouth daily. Take 3.75MG  daily on Tuesday, Thursday and Saturday and then 5MG  daily all other days  0 10/29/2017 at AM  . diazepam (VALIUM) 5 MG tablet Take 1 tablet (5 mg total) by mouth every 8 (eight) hours as needed for muscle spasms. (Patient not taking: Reported on 01/26/2017) 8  tablet 0 -- at --  . metoCLOPramide (REGLAN) 10 MG tablet Take 1 tablet (10 mg total) by mouth 4 (four) times daily -  before meals and at bedtime. (Patient not taking: Reported on 01/26/2017) 60 tablet 0 -- at --  . metoprolol tartrate (LOPRESSOR) 25 MG tablet Take 1 tablet (25 mg total) by mouth 2 (two) times daily. Take IF BP >130 (top number) (Patient not taking: Reported on 10/29/2017)   -- at --  . naproxen (NAPROSYN) 500 MG tablet Take 1 tablet (500 mg total) by mouth 2 (two) times daily with a meal. (Patient not taking: Reported on 01/26/2017) 20 tablet 0 -- at --  . traMADol (ULTRAM) 50 MG tablet Take 1 tablet (50 mg total) by mouth every 12 (twelve) hours as needed. (Patient not taking: Reported on 10/29/2017) 12 tablet 0 -- at --   Scheduled:  . insulin aspart  0-5 Units Subcutaneous QHS  . insulin aspart  0-9 Units Subcutaneous TID WC  . nystatin  5 mL Mouth/Throat QID  . warfarin  3.5 mg Oral Once per day on Tue Thu Sat  . warfarin  5 mg Oral Once  per day on Sun Mon Wed Fri  . Warfarin - Pharmacist Dosing Inpatient   Does not apply q1800    Assessment: Pharmacy consulted to dose and monitor warfarin in this 77 year old woman on warfarin for afib. Home dose: 3.5 (tue, thur and sat) and 5 mg all other days.   Potential DDI: Levaquin                  INR           DOSE 2/7          1.75           3.5 mg  2/8           1.58  Goal of Therapy:  INR 2-3 Monitor platelets by anticoagulation protocol: Yes   Plan:  Will give warfarin 7.5 mg PO x 1 (50% increase) from home dose then will continue home dose.   Lemon Sternberg D 10/31/2017,8:16 AM

## 2017-11-03 LAB — CULTURE, BLOOD (ROUTINE X 2)
Culture: NO GROWTH
Culture: NO GROWTH
Special Requests: ADEQUATE
Special Requests: ADEQUATE

## 2017-11-03 NOTE — Discharge Summary (Signed)
Tracy Dickerson, is a 77 y.o. female  DOB January 14, 1941  MRN [id number].  Admission date:  10/29/2017  Admitting Physician  Hillary Bow, MD  Discharge Date:  10/31/2017   Primary MD  Boykin Nearing, MD  Recommendations for primary care physician for things to follow: Follow-up with PCP in 1 week    Admission Diagnosis  Shortness of breath [R06.02] Community acquired pneumonia of right upper lobe of lung ([place]) [J18.1]   Discharge Diagnosis  Shortness of breath [R06.02] Community acquired pneumonia of right upper lobe of lung ([place]) [J18.1]    Active Problems:   Pneumonia      Past Medical History:  Diagnosis Date  . Cancer ([place])    Breast and lung  . Diabetes mellitus without complication ([place])   . GERD (gastroesophageal reflux disease)   . Heart disease   . Hypertension     Past Surgical History:  Procedure Laterality Date  . ABDOMINAL HYSTERECTOMY    . APPENDECTOMY    . BREAST SURGERY     breast cancer  . TONSILLECTOMY         History of present illness and  Hospital Course:     Kindly see H&P for history of present illness and admission details, please review complete Labs, Consult reports and Test reports for all details in brief  HPI  from the history and physical done on the day of admission 77 year old female patient with history of lung cancer comes in because of shortness of breath found to have pneumonia and sepsis with elevated white count up to 22,, lactic acid Level of 2.6.    Hospital Course  #1 sepsis present on admission secondary to right middle and lower lobe pneumonia: Lactic acid decreased from 2.6-1.4, patient received IV fluids, IV antibiotics, WBC decreased.  Discharged home with Levaquin 500 mg p.o. daily for 7 days. 3.  History of lung cancer status post right  lobectomy.recommend rpt chest xray after symptoms resolution,to make sure its not recurrent cancer.she needs to follow with her PCP in one week. 3.  Diabetes mellitus type 2: Continue metformin at discharge but patient did not get metformin while in the hospital and discharge.  Secondary to lactic acidosis.  Resume metformin.  At discharge. , #4 chronic A. fib: Rate controlled, continue metoprolol, warfarin.  Continue and follow-up with PCP at [facility].  Discharge Condition: Stable   Follow UP  Follow-up Information    Boykin Nearing, MD. Schedule an appointment as soon as possible for a visit in 1 week(s).   Specialty:  Internal Medicine Contact information: [address] [place] [address] [phone]             Discharge Instructions  and  Discharge Medications      Allergies as of 10/31/2017      Reactions   Percocet [oxycodone-acetaminophen]    Penicillins Rash      Medication List    TAKE these medications   ADVAIR DISKUS 250-50 MCG/DOSE Aepb Generic drug:  Fluticasone-Salmeterol Inhale 1 puff into the lungs 2 (two) times daily.   atorvastatin 80 MG tablet Commonly known as:  LIPITOR Take 80 mg by mouth daily.   diazepam 5 MG tablet Commonly known as:  VALIUM Take 1 tablet (5 mg total) by mouth every 8 (eight) hours as needed for muscle spasms.   diphenhydrAMINE 25 mg capsule Commonly known as:  BENADRYL Take 2 capsules (50 mg total) by mouth every 6 (six) hours as needed.  ferrous sulfate 160 (50 Fe) MG Tbcr SR tablet Commonly known as:  EQL SLOW RELEASE IRON Take 1 tablet (160 mg total) by mouth daily.   gabapentin 300 MG capsule Commonly known as:  NEURONTIN Take 300 mg by mouth at bedtime.   levofloxacin 500 MG tablet Commonly known as:  LEVAQUIN Take 1 tablet (500 mg total) by mouth daily for 10 days.   metFORMIN 500 MG tablet Commonly known as:  GLUCOPHAGE Take 1 tablet (500 mg total) by mouth 2 (two) times daily.    metoCLOPramide 10 MG tablet Commonly known as:  REGLAN Take 1 tablet (10 mg total) by mouth 4 (four) times daily -  before meals and at bedtime.   metoprolol tartrate 25 MG tablet Commonly known as:  LOPRESSOR Take 1 tablet (25 mg total) by mouth 2 (two) times daily. Take IF BP >130 (top number)   naproxen 500 MG tablet Commonly known as:  NAPROSYN Take 1 tablet (500 mg total) by mouth 2 (two) times daily with a meal.   omeprazole 40 MG capsule Commonly known as:  PRILOSEC Take 40 mg by mouth daily.   pramipexole 0.25 MG tablet Commonly known as:  MIRAPEX Take 0.25 mg by mouth at bedtime.   PROAIR HFA 108 (90 Base) MCG/ACT inhaler Generic drug:  albuterol Inhale 2 puffs into the lungs every 4 (four) hours as needed.   SLOW FE 142 (45 Fe) MG Tbcr Generic drug:  Ferrous Sulfate Take 1 tablet by mouth daily.   traMADol 50 MG tablet Commonly known as:  ULTRAM Take 1 tablet (50 mg total) by mouth every 6 (six) hours as needed. What changed:  Another medication with the same name was removed. Continue taking this medication, and follow the directions you see here.   triamterene-hydrochlorothiazide 37.5-25 MG tablet Commonly known as:  MAXZIDE-25 Take 1 tablet by mouth daily.   warfarin 2.5 MG tablet Commonly known as:  COUMADIN Take 3.75-5 mg by mouth daily. Take 3.75MG  daily on Tuesday, Thursday and Saturday and then 5MG  daily all other days         Diet and Activity recommendation: See Discharge Instructions above   Consults obtained - none   Major procedures and Radiology Reports - PLEASE review detailed and final reports for all details, in brief -      Dg Chest 2 View  Result Date: 10/29/2017 CLINICAL DATA:  Shortness of breath. COPD. History of breast cancer. EXAM: CHEST  2 VIEW COMPARISON:  01/26/2017 FINDINGS: The cardiac silhouette remains mildly enlarged. Aortic atherosclerosis is noted. There is slight elevation of the right hemidiaphragm which is  chronic. Interstitial coarsening and peribronchial thickening are greater than on the prior study, again with asymmetric greater involvement of the right lung where there is now also nodularity including in the upper lobe and peripheral midlung. Chronic pleural thickening is present laterally in the right chest. No layering pleural effusion is seen posteriorly. No pneumothorax is identified. Surgical clips project over the left breast and axilla. IMPRESSION: Chronic lung disease with increased diffuse interstitial coarsening and right lung nodularity. This may represent superimposed acute (potentially typical) infection. Interval worsening of chronic interstitial disease is also possible, and lymphangitic tumor is not excluded. Electronically Signed   By: Logan Bores M.D.   On: 10/29/2017 14:43   Ct Chest W Contrast  Result Date: 10/29/2017 CLINICAL DATA:  Pt in via EMS, Pt was walking home from the bus stop and pt became short of breath. Pt advised that this  is normal for her when she walks long distances. EXAM: CT CHEST WITH CONTRAST TECHNIQUE: Multidetector CT imaging of the chest was performed during intravenous contrast administration. CONTRAST:  52mL ISOVUE-300 IOPAMIDOL (ISOVUE-300) INJECTION 61% COMPARISON:  Chest radiograph 10/29/2017 40 years is No significant vascular findings. Normal heart size. No pericardial effusion. FINDINGS: Cardiovascular: No significant vascular findings. Normal heart size. No pericardial effusion. Mediastinum/Nodes: No no axillary supraclavicular adenopathy. No mediastinal hilar adenopathy. No pericardial fluid. Esophagus normal Lungs/Pleura: Multifocal airspace disease in the RIGHT upper lobe. There are small cavitary/ring shaped foci of airspace disease. Mild subpleural reticulation in the RIGHT lower lobe and mild nodular airspace disease. LEFT lung is relatively free of the nodular airspace disease. Upper Abdomen: Limited view of the liver, kidneys, pancreas are  unremarkable. Normal adrenal glands. Musculoskeletal: No aggressive osseous lesion. IMPRESSION: 1. Nodular airspace disease in the RIGHT upper lobe and to lesser degree the RIGHT lower lobe in a ring shaped/cavitary pattern lobe is consistent with multifocal pulmonary infection. Consider bacterial and atypical infections etiologies. 2. Mild peripheral chronic interstitial lung disease. Electronically Signed   By: Suzy Bouchard M.D.   On: 10/29/2017 16:54    Micro Results     Recent Results (from the past 240 hour(s))  Blood Culture (routine x 2)     Status: None   Collection Time: 10/29/17  4:59 PM  Result Value Ref Range Status   Specimen Description BLOOD RAC  Final   Special Requests   Final    BOTTLES DRAWN AEROBIC AND ANAEROBIC Blood Culture adequate volume   Culture   Final    NO GROWTH 5 DAYS Performed at [facility], [address], [place], [place] [address]    Report Status 11/03/2017 FINAL  Final  Blood Culture (routine x 2)     Status: None   Collection Time: 10/29/17  5:04 PM  Result Value Ref Range Status   Specimen Description BLOOD LEFT HAND  Final   Special Requests   Final    BOTTLES DRAWN AEROBIC AND ANAEROBIC Blood Culture adequate volume   Culture   Final    NO GROWTH 5 DAYS Performed at [facility], [address] [place], [place] [address]    Report Status 11/03/2017 FINAL  Final  Urine culture     Status: None   Collection Time: 10/29/17 11:40 PM  Result Value Ref Range Status   Specimen Description   Final    URINE, RANDOM Performed at [facility], [address] [place], [place] [address]    Special Requests   Final    NONE Performed at [facility], [address], [place], [place] [address]    Culture   Final    NO GROWTH Performed at [facility], [place] [address] [place], [place] [address]    Report Status 10/31/2017 FINAL  Final  MRSA PCR Screening     Status: None   Collection Time:  10/29/17 11:40 PM  Result Value Ref Range Status   MRSA by PCR NEGATIVE NEGATIVE Final    Comment:        The GeneXpert MRSA Assay (FDA approved for NASAL specimens only), is one component of a comprehensive MRSA colonization surveillance program. It is not intended to diagnose MRSA infection nor to guide or monitor treatment for MRSA infections. Performed at [facility], [address] [place], [place] [address]        Today   Subjective:   Delcenia Inman  today has no headache,no chest abdominal pain,no new weakness tingling or numbness, feels much better wants to go home today.   Objective:   Blood pressure 121/76, pulse 93, temperature 97.7 F (36.5 C), temperature source Oral, resp. rate 14, height 5\' 6"  (1.676 m), weight 111.6 kg (246 lb), SpO2 95 %.  No intake or output data in the 24 hours ending 11/03/17 1602  Exam Awake Alert, Oriented x 3, No new F.N deficits, Normal affect [place].AT,PERRAL Supple Neck,No JVD, No cervical lymphadenopathy appriciated.  Symmetrical Chest wall movement, Good air movement bilaterally, CTAB RRR,No Gallops,Rubs or new Murmurs, No Parasternal Heave +ve B.Sounds, Abd Soft, Non tender, No organomegaly appriciated, No rebound -guarding or rigidity. No Cyanosis, Clubbing or edema, No new Rash or bruise  Data Review   CBC w Diff:  Lab Results  Component Value Date   WBC 18.6 (H) 10/30/2017   HGB 8.9 (L) 10/30/2017   HGB 11.2 (L) 01/07/2015   HCT 28.0 (L) 10/30/2017   HCT 34.8 (L) 01/07/2015   PLT 227 10/30/2017   PLT 219 01/07/2015   LYMPHOPCT 7 10/29/2017   LYMPHOPCT 22.4 01/07/2015   MONOPCT 4 10/29/2017   MONOPCT 7.0 01/07/2015   EOSPCT 0 10/29/2017   EOSPCT 2.8 01/07/2015   BASOPCT 0 10/29/2017   BASOPCT 0.7 01/07/2015    CMP:  Lab Results  Component Value Date   NA 140 10/30/2017   NA 139 01/07/2015   K 3.1 (L) 10/30/2017   K 3.9 01/07/2015   CL 107 10/30/2017   CL 103 01/07/2015   CO2 25 10/30/2017    CO2 29 01/07/2015   BUN 19 10/30/2017   BUN 16 01/07/2015   CREATININE 0.72 10/30/2017   CREATININE 0.69 01/07/2015   PROT 6.8 04/18/2017   PROT 7.4 09/19/2014   ALBUMIN 3.4 (L) 04/18/2017   ALBUMIN 3.1 (L) 09/19/2014   BILITOT 0.5 04/18/2017   BILITOT 0.3 09/19/2014   ALKPHOS 69 04/18/2017   ALKPHOS 94 09/19/2014   AST 30 04/18/2017   AST 91 (H) 09/19/2014   ALT 17 04/18/2017   ALT 85 (H) 09/19/2014  .   Total Time in preparing paper work, data evaluation and todays exam - 35 minutes  Epifanio Lesches M.D on 10/31/2017 at 4:02 PM    Note: This dictation was prepared with Dragon dictation along with smaller phrase technology. Any transcriptional errors that result from this process are unintentional.

## 2017-11-24 ENCOUNTER — Emergency Department: Payer: Medicare Other

## 2017-11-24 ENCOUNTER — Encounter: Payer: Self-pay | Admitting: Emergency Medicine

## 2017-11-24 ENCOUNTER — Emergency Department
Admission: EM | Admit: 2017-11-24 | Discharge: 2017-11-24 | Disposition: A | Discharge status: Home / Self Care | Payer: Medicare Other | Attending: Emergency Medicine | Admitting: Emergency Medicine

## 2017-11-24 ENCOUNTER — Other Ambulatory Visit: Payer: Self-pay

## 2017-11-24 DIAGNOSIS — Z85118 Personal history of other malignant neoplasm of bronchus and lung: Secondary | ICD-10-CM | POA: Insufficient documentation

## 2017-11-24 DIAGNOSIS — E119 Type 2 diabetes mellitus without complications: Secondary | ICD-10-CM | POA: Insufficient documentation

## 2017-11-24 DIAGNOSIS — Z79899 Other long term (current) drug therapy: Secondary | ICD-10-CM | POA: Diagnosis not present

## 2017-11-24 DIAGNOSIS — Z7984 Long term (current) use of oral hypoglycemic drugs: Secondary | ICD-10-CM | POA: Diagnosis not present

## 2017-11-24 DIAGNOSIS — R079 Chest pain, unspecified: Secondary | ICD-10-CM | POA: Diagnosis not present

## 2017-11-24 DIAGNOSIS — Z853 Personal history of malignant neoplasm of breast: Secondary | ICD-10-CM | POA: Insufficient documentation

## 2017-11-24 DIAGNOSIS — I10 Essential (primary) hypertension: Secondary | ICD-10-CM | POA: Insufficient documentation

## 2017-11-24 DIAGNOSIS — Z7901 Long term (current) use of anticoagulants: Secondary | ICD-10-CM | POA: Insufficient documentation

## 2017-11-24 DIAGNOSIS — Z87891 Personal history of nicotine dependence: Secondary | ICD-10-CM | POA: Insufficient documentation

## 2017-11-24 LAB — CBC
HCT: 31.9 % — ABNORMAL LOW (ref 35.0–47.0)
Hemoglobin: 10 g/dL — ABNORMAL LOW (ref 12.0–16.0)
MCH: 25 pg — ABNORMAL LOW (ref 26.0–34.0)
MCHC: 31.5 g/dL — ABNORMAL LOW (ref 32.0–36.0)
MCV: 79.6 fL — ABNORMAL LOW (ref 80.0–100.0)
Platelets: 210 10*3/uL (ref 150–440)
RBC: 4.01 MIL/uL (ref 3.80–5.20)
RDW: 18.6 % — ABNORMAL HIGH (ref 11.5–14.5)
WBC: 11.3 10*3/uL — ABNORMAL HIGH (ref 3.6–11.0)

## 2017-11-24 LAB — BASIC METABOLIC PANEL
Anion gap: 11 (ref 5–15)
BUN: 20 mg/dL (ref 6–20)
CO2: 23 mmol/L (ref 22–32)
Calcium: 8.9 mg/dL (ref 8.9–10.3)
Chloride: 107 mmol/L (ref 101–111)
Creatinine, Ser: 0.76 mg/dL (ref 0.44–1.00)
GFR calc Af Amer: >60 mL/min (ref >60)
GFR calc non Af Amer: >60 mL/min (ref >60)
Glucose, Bld: 115 mg/dL — ABNORMAL HIGH (ref 65–99)
Potassium: 3.6 mmol/L (ref 3.5–5.1)
Sodium: 141 mmol/L (ref 135–145)

## 2017-11-24 LAB — TROPONIN I: Troponin I: <0.03 ng/mL (ref <0.03)

## 2017-11-24 MED ORDER — GABAPENTIN 300 MG PO CAPS: 300.0000 mg | ORAL_CAPSULE | Freq: Every day | ORAL | 0 refills | Status: DC

## 2017-11-24 NOTE — ED Triage Notes (Signed)
Pt here for right chest/mid axillary pain.  Has also had some SHOB.  Was treated inpatient within last month for PNA and thinks may still have some.  Pt reports she is her own legal guardian despite chart saying she has one.  Unlabored currently. VSS.  No longer on abx.  Has had cough.

## 2017-11-24 NOTE — ED Notes (Signed)
NAD noted at time of D/C. Pt denies questions or concerns. Pt ambulatory to the lobby at this time with her brother.

## 2017-11-24 NOTE — ED Triage Notes (Addendum)
First Nurse Note:  ARrives via [facility].  Recent hospitalization for pneumonia.  Completed antibx therapy last Friday.  Today C/O right sided rib pain with movement.    CBG:  163 VS wnl

## 2017-11-24 NOTE — ED Provider Notes (Addendum)
[facility] Emergency Department Provider Note  ____________________________________________   I have reviewed the triage vital signs and the nursing notes.   HISTORY  Chief Complaint Chest Pain   History limited by: Not Limited   HPI Tracy Dickerson is a 77 y.o. female who presents to the emergency department today because of concern for continued chest pain. Pain located to the right lower chest. It has been presents since the patient was discharged from the hospital roughly 1 month ago because of pneumonia. States she came in today because family convinced her to be evaluated. She denies any shortness of breath, no fevers.    Per medical record review patient has a history of GERD, HTN, recent admission for pneumonia.   Past Medical History:  Diagnosis Date  . Cancer ([place])    Breast and lung  . Diabetes mellitus without complication ([place])   . GERD (gastroesophageal reflux disease)   . Heart disease   . Hypertension     Patient Active Problem List   Diagnosis Date Noted  . Pneumonia 10/29/2017  . Hypotension 01/26/2017    Past Surgical History:  Procedure Laterality Date  . ABDOMINAL HYSTERECTOMY    . APPENDECTOMY    . BREAST SURGERY     breast cancer  . TONSILLECTOMY      Prior to Admission medications   Medication Sig Start Date End Date Taking? Authorizing Provider  ADVAIR DISKUS 250-50 MCG/DOSE AEPB Inhale 1 puff into the lungs 2 (two) times daily. 12/14/16   [provider]  albuterol (PROAIR HFA) 108 (90 Base) MCG/ACT inhaler Inhale 2 puffs into the lungs every 4 (four) hours as needed. 04/08/17   [provider]  atorvastatin (LIPITOR) 80 MG tablet Take 80 mg by mouth daily.    [provider]  diazepam (VALIUM) 5 MG tablet Take 1 tablet (5 mg total) by mouth every 8 (eight) hours as needed for muscle spasms. Patient not taking: Reported on 01/26/2017 06/08/15   Carrie Mew, MD  diphenhydrAMINE (BENADRYL)  25 mg capsule Take 2 capsules (50 mg total) by mouth every 6 (six) hours as needed. 06/08/15   Carrie Mew, MD  ferrous sulfate (EQL SLOW RELEASE IRON) 160 (50 Fe) MG TBCR SR tablet Take 1 tablet (160 mg total) by mouth daily. 04/18/17   Earleen Newport, MD  Ferrous Sulfate (SLOW FE) 142 (45 Fe) MG TBCR Take 1 tablet by mouth daily.    [provider]  gabapentin (NEURONTIN) 300 MG capsule Take 300 mg by mouth at bedtime. 12/15/16   [provider]  metFORMIN (GLUCOPHAGE) 500 MG tablet Take 1 tablet (500 mg total) by mouth 2 (two) times daily. 02/06/15 01/26/18  Harvest Dark, MD  metoCLOPramide (REGLAN) 10 MG tablet Take 1 tablet (10 mg total) by mouth 4 (four) times daily -  before meals and at bedtime. Patient not taking: Reported on 01/26/2017 06/08/15   Carrie Mew, MD  metoprolol tartrate (LOPRESSOR) 25 MG tablet Take 1 tablet (25 mg total) by mouth 2 (two) times daily. Take IF BP >130 (top number) Patient not taking: Reported on 10/29/2017 01/27/17   Bettey Costa, MD  naproxen (NAPROSYN) 500 MG tablet Take 1 tablet (500 mg total) by mouth 2 (two) times daily with a meal. Patient not taking: Reported on 01/26/2017 06/08/15   Carrie Mew, MD  omeprazole (PRILOSEC) 40 MG capsule Take 40 mg by mouth daily. 01/15/17   [provider]  pramipexole (MIRAPEX) 0.25 MG tablet Take 0.25  mg by mouth at bedtime. 12/23/16   [provider]  traMADol (ULTRAM) 50 MG tablet Take 1 tablet (50 mg total) by mouth every 6 (six) hours as needed. 04/18/17 04/18/18  Merlyn Lot, MD  triamterene-hydrochlorothiazide (MAXZIDE-25) 37.5-25 MG tablet Take 1 tablet by mouth daily.    [provider]  warfarin (COUMADIN) 2.5 MG tablet Take 3.75-5 mg by mouth daily. Take 3.75MG  daily on Tuesday, Thursday and Saturday and then 5MG  daily all other days 01/21/17   [provider]    Allergies Percocet [oxycodone-acetaminophen] and Penicillins  Family History   Problem Relation Age of Onset  . Cancer Mother     Social History Social History   Tobacco Use  . Smoking status: Former Research scientist (life sciences)  . Smokeless tobacco: Never Used  Substance Use Topics  . Alcohol use: Yes  . Drug use: No    Review of Systems Constitutional: No fever/chills Eyes: No visual changes. ENT: No sore throat. Cardiovascular: Positive for right lower chest pain. Respiratory: Denies shortness of breath. Gastrointestinal: No abdominal pain.  No nausea, no vomiting.  No diarrhea.   Genitourinary: Negative for dysuria. Musculoskeletal: Negative for back pain. Skin: Negative for rash. Neurological: Negative for headaches, focal weakness or numbness.  ____________________________________________   PHYSICAL EXAM:  VITAL SIGNS: ED Triage Vitals  Enc Vitals Group     BP 11/24/17 1544 (!) 119/57     Pulse Rate 11/24/17 1544 74     Resp 11/24/17 1544 20     Temp 11/24/17 1544 (!) 97.4 F (36.3 C)     Temp Source 11/24/17 1544 Oral     SpO2 11/24/17 1544 97 %     Weight 11/24/17 1545 250 lb (113.4 kg)     Height 11/24/17 1545 5\' 6"  (1.676 m)     Head Circumference --      Peak Flow --      Pain Score 11/24/17 1545 8   Constitutional: Alert and oriented. Well appearing and in no distress. Eyes: Conjunctivae are normal.  ENT   Head: Normocephalic and atraumatic.   Nose: No congestion/rhinnorhea.   Mouth/Throat: Mucous membranes are moist.   Neck: No stridor. Hematological/Lymphatic/Immunilogical: No cervical lymphadenopathy. Cardiovascular: Normal rate, regular rhythm.  No murmurs, rubs, or gallops. Respiratory: Normal respiratory effort without tachypnea nor retractions. Breath sounds are clear and equal bilaterally. No wheezes/rales/rhonchi. Gastrointestinal: Soft and non tender. No rebound. No guarding.  Genitourinary: Deferred Musculoskeletal: Normal range of motion in all extremities. No lower extremity edema. Neurologic:  Normal speech and  language. No gross focal neurologic deficits are appreciated.  Skin:  Skin is warm, dry and intact. No rash noted. Psychiatric: Mood and affect are normal. Speech and behavior are normal. Patient exhibits appropriate insight and judgment.  ____________________________________________    LABS (pertinent positives/negatives)  Trop <0.03 CBC wbc 11.3, hgb 10.0, plt 210 BMP wnl except glu 115 ____________________________________________   EKG  I, Nance Pear, attending physician, personally viewed and interpreted this EKG  EKG Time: 1551 Rate: 100 Rhythm: sinus rhythm with premature supraventricular beat Axis: left axis deviation Intervals: qtc 469 QRS: narrow ST changes: no st elevation Impression: abnormal ekg   ____________________________________________    RADIOLOGY  CXR No acute disease   ____________________________________________   PROCEDURES  Procedures  ____________________________________________   INITIAL IMPRESSION / ASSESSMENT AND PLAN / ED COURSE  Pertinent labs & imaging results that were available during my care of the patient were reviewed by me and considered in my medical decision making (see  chart for details).  Patient with right lower chest pain. Has been constant for roughly one month. No concerning findings for pneumonia on work up. No other clinical symptoms for pneumonia. Doubt ACS, PE. Will discharge patient home.  ____________________________________________   FINAL CLINICAL IMPRESSION(S) / ED DIAGNOSES  Final diagnoses:  Nonspecific chest pain     Note: This dictation was prepared with Dragon dictation. Any transcriptional errors that result from this process are unintentional     Nance Pear, MD 11/24/17 Dorian Furnace, MD 11/24/17 [phone]

## 2017-11-24 NOTE — ED Notes (Signed)
Pt is A&Ox4, brother at bedside.  Per pt she does not have a legal guardian, but that her brother is going to help her get home.  Charge nurse Ignatius Specking, RN aware.  Pt okay to discharge at this time.

## 2017-11-24 NOTE — Discharge Instructions (Signed)
Please seek medical attention for any high fevers, chest pain, shortness of breath, change in behavior, persistent vomiting, bloody stool or any other new or concerning symptoms.  

## 2018-01-21 ENCOUNTER — Encounter: Payer: Self-pay | Admitting: Emergency Medicine

## 2018-01-21 ENCOUNTER — Other Ambulatory Visit: Payer: Self-pay

## 2018-01-21 ENCOUNTER — Emergency Department
Admission: EM | Admit: 2018-01-21 | Discharge: 2018-01-21 | Disposition: A | Discharge status: Home / Self Care | Payer: Medicare Other | Attending: Emergency Medicine | Admitting: Emergency Medicine

## 2018-01-21 ENCOUNTER — Emergency Department: Payer: Medicare Other

## 2018-01-21 DIAGNOSIS — R05 Cough: Secondary | ICD-10-CM | POA: Insufficient documentation

## 2018-01-21 DIAGNOSIS — Z7984 Long term (current) use of oral hypoglycemic drugs: Secondary | ICD-10-CM | POA: Diagnosis not present

## 2018-01-21 DIAGNOSIS — Z79899 Other long term (current) drug therapy: Secondary | ICD-10-CM | POA: Insufficient documentation

## 2018-01-21 DIAGNOSIS — R0602 Shortness of breath: Secondary | ICD-10-CM | POA: Diagnosis present

## 2018-01-21 DIAGNOSIS — E119 Type 2 diabetes mellitus without complications: Secondary | ICD-10-CM | POA: Insufficient documentation

## 2018-01-21 DIAGNOSIS — R42 Dizziness and giddiness: Secondary | ICD-10-CM | POA: Diagnosis not present

## 2018-01-21 DIAGNOSIS — I10 Essential (primary) hypertension: Secondary | ICD-10-CM | POA: Insufficient documentation

## 2018-01-21 DIAGNOSIS — Z7901 Long term (current) use of anticoagulants: Secondary | ICD-10-CM | POA: Insufficient documentation

## 2018-01-21 DIAGNOSIS — R059 Cough, unspecified: Secondary | ICD-10-CM

## 2018-01-21 LAB — CBC
HCT: 32.8 % — ABNORMAL LOW (ref 35.0–47.0)
Hemoglobin: 10.7 g/dL — ABNORMAL LOW (ref 12.0–16.0)
MCH: 25.2 pg — ABNORMAL LOW (ref 26.0–34.0)
MCHC: 32.6 g/dL (ref 32.0–36.0)
MCV: 77.3 fL — ABNORMAL LOW (ref 80.0–100.0)
Platelets: 220 10*3/uL (ref 150–440)
RBC: 4.24 MIL/uL (ref 3.80–5.20)
RDW: 19 % — ABNORMAL HIGH (ref 11.5–14.5)
WBC: 10.3 10*3/uL (ref 3.6–11.0)

## 2018-01-21 LAB — BASIC METABOLIC PANEL
Anion gap: 9 (ref 5–15)
BUN: 17 mg/dL (ref 6–20)
CO2: 28 mmol/L (ref 22–32)
Calcium: 8.8 mg/dL — ABNORMAL LOW (ref 8.9–10.3)
Chloride: 102 mmol/L (ref 101–111)
Creatinine, Ser: 0.7 mg/dL (ref 0.44–1.00)
GFR calc Af Amer: >60 mL/min (ref >60)
GFR calc non Af Amer: >60 mL/min (ref >60)
Glucose, Bld: 104 mg/dL — ABNORMAL HIGH (ref 65–99)
Potassium: 3.1 mmol/L — ABNORMAL LOW (ref 3.5–5.1)
Sodium: 139 mmol/L (ref 135–145)

## 2018-01-21 LAB — GLUCOSE, CAPILLARY: Glucose-Capillary: 106 mg/dL — ABNORMAL HIGH (ref 65–99)

## 2018-01-21 LAB — TROPONIN I: Troponin I: <0.03 ng/mL (ref <0.03)

## 2018-01-21 NOTE — ED Triage Notes (Signed)
Pt presents to ED with c/o [facility] and dizziness. Pt states is coughing up yellow and white phlegm. Pt states today she had an episode of dizziness and almost fell. Pt is alert and appropriate in triage. Pt states was treated for pneumonia recently.

## 2018-01-21 NOTE — ED Notes (Signed)
Notified by security patient left. Pt found in lobby and brought back to room. Pt asked to stay in room and wait for paperwork. Patient agrees to wait.

## 2018-01-21 NOTE — ED Provider Notes (Signed)
[facility] Emergency Department Provider Note  Time seen: 6:01 PM  I have reviewed the triage vital signs and the nursing notes.   HISTORY  Chief Complaint Shortness of Breath and Dizziness    HPI Tracy Dickerson is a 77 y.o. female with a past medical history of breast and lung cancer now in remission, diabetes, gastric reflux, hypertension, presents to the emergency department for shortness of breath and dizziness.  According to the patient for the past several weeks she has had a fairly constant cough ever since being diagnosed with pneumonia.  Patient states that she had a prolonged coughing spell today and began feeling dizzy and lightheaded like she might pass out.  This concerned her so she came to the emergency department.  Patient states the dizziness is very brief and is now passed.  States that shortness of breath and cough have been a chronic issue for the past several weeks.  States occasional white sputum production.  Denies any fever.  Denies any chest pain at any point.  Past Medical History:  Diagnosis Date  . Cancer ([place])    Breast and lung  . Diabetes mellitus without complication ([place])   . GERD (gastroesophageal reflux disease)   . Heart disease   . Hypertension     Patient Active Problem List   Diagnosis Date Noted  . Pneumonia 10/29/2017  . Hypotension 01/26/2017    Past Surgical History:  Procedure Laterality Date  . ABDOMINAL HYSTERECTOMY    . APPENDECTOMY    . BREAST SURGERY     breast cancer  . TONSILLECTOMY      Prior to Admission medications   Medication Sig Start Date End Date Taking? Authorizing Provider  ADVAIR DISKUS 250-50 MCG/DOSE AEPB Inhale 1 puff into the lungs 2 (two) times daily. 12/14/16   [provider]  albuterol (PROAIR HFA) 108 (90 Base) MCG/ACT inhaler Inhale 2 puffs into the lungs every 4 (four) hours as needed. 04/08/17   [provider]  atorvastatin (LIPITOR) 80 MG tablet Take 80 mg by  mouth daily.    [provider]  diazepam (VALIUM) 5 MG tablet Take 1 tablet (5 mg total) by mouth every 8 (eight) hours as needed for muscle spasms. Patient not taking: Reported on 01/26/2017 06/08/15   Carrie Mew, MD  diphenhydrAMINE (BENADRYL) 25 mg capsule Take 2 capsules (50 mg total) by mouth every 6 (six) hours as needed. 06/08/15   Carrie Mew, MD  ferrous sulfate (EQL SLOW RELEASE IRON) 160 (50 Fe) MG TBCR SR tablet Take 1 tablet (160 mg total) by mouth daily. 04/18/17   Earleen Newport, MD  Ferrous Sulfate (SLOW FE) 142 (45 Fe) MG TBCR Take 1 tablet by mouth daily.    [provider]  gabapentin (NEURONTIN) 300 MG capsule Take 1 capsule (300 mg total) by mouth at bedtime. 11/24/17   Nance Pear, MD  metFORMIN (GLUCOPHAGE) 500 MG tablet Take 1 tablet (500 mg total) by mouth 2 (two) times daily. 02/06/15 01/26/18  Harvest Dark, MD  metoCLOPramide (REGLAN) 10 MG tablet Take 1 tablet (10 mg total) by mouth 4 (four) times daily -  before meals and at bedtime. Patient not taking: Reported on 01/26/2017 06/08/15   Carrie Mew, MD  metoprolol tartrate (LOPRESSOR) 25 MG tablet Take 1 tablet (25 mg total) by mouth 2 (two) times daily. Take IF BP >130 (top number) Patient not taking: Reported on 10/29/2017 01/27/17   Bettey Costa, MD  naproxen (NAPROSYN) 500  MG tablet Take 1 tablet (500 mg total) by mouth 2 (two) times daily with a meal. Patient not taking: Reported on 01/26/2017 06/08/15   Carrie Mew, MD  omeprazole (PRILOSEC) 40 MG capsule Take 40 mg by mouth daily. 01/15/17   [provider]  pramipexole (MIRAPEX) 0.25 MG tablet Take 0.25 mg by mouth at bedtime. 12/23/16   [provider]  traMADol (ULTRAM) 50 MG tablet Take 1 tablet (50 mg total) by mouth every 6 (six) hours as needed. 04/18/17 04/18/18  Merlyn Lot, MD  triamterene-hydrochlorothiazide (MAXZIDE-25) 37.5-25 MG tablet Take 1 tablet by mouth daily.    [provider]   warfarin (COUMADIN) 2.5 MG tablet Take 3.75-5 mg by mouth daily. Take 3.75MG  daily on Tuesday, Thursday and Saturday and then 5MG  daily all other days 01/21/17   [provider]    Allergies  Allergen Reactions  . Percocet [Oxycodone-Acetaminophen]   . Penicillins Rash    Family History  Problem Relation Age of Onset  . Cancer Mother     Social History Social History   Tobacco Use  . Smoking status: Former Research scientist (life sciences)  . Smokeless tobacco: Never Used  Substance Use Topics  . Alcohol use: Yes  . Drug use: No    Review of Systems Constitutional: Negative for fever.  Dizziness now resolved. Eyes: Negative for visual complaints ENT: Negative for recent illness/congestion Cardiovascular: Negative for chest pain. Respiratory: Positive for shortness of breath and cough ongoing times weeks. Gastrointestinal: Negative for abdominal pain, vomiting Genitourinary: Negative for urinary compaints Musculoskeletal: Negative for leg pain or swelling Skin: Negative for skin complaints  Neurological: Negative for headache All other ROS negative  ____________________________________________   PHYSICAL EXAM:  VITAL SIGNS: ED Triage Vitals  Enc Vitals Group     BP 01/21/18 1430 113/79     Pulse Rate 01/21/18 1430 98     Resp 01/21/18 1708 18     Temp 01/21/18 1430 97.9 F (36.6 C)     Temp Source 01/21/18 1430 Oral     SpO2 01/21/18 1430 95 %     Weight 01/21/18 1433 250 lb (113.4 kg)     Height 01/21/18 1433 5\' 6"  (1.676 m)     Head Circumference --      Peak Flow --      Pain Score 01/21/18 1448 10     Pain Loc --      Pain Edu? --      Excl. in [place]? --     Constitutional: Alert and oriented. Well appearing and in no distress. Eyes: Normal exam ENT   Head: Normocephalic and atraumatic.   Mouth/Throat: Mucous membranes are moist. Cardiovascular: Normal rate, regular rhythm.  Respiratory: Normal respiratory effort without tachypnea nor retractions. Breath  sounds are clear and equal bilaterally.  No obvious rhonchi. Gastrointestinal: Soft and nontender. No distention.   Musculoskeletal: Nontender with normal range of motion in all extremities.  Neurologic:  Normal speech and language. No gross focal neurologic deficits Skin:  Skin is warm, dry and intact.  Psychiatric: Mood and affect are normal.  ____________________________________________    EKG  EKG reviewed and interpreted by myself shows sinus rhythm at 99 bpm with a widened QRS, left axis deviation, largely normal intervals, nonspecific ST changes.  ____________________________________________    RADIOLOGY  Chest x-ray is stable without acute abnormality.  ____________________________________________   INITIAL IMPRESSION / ASSESSMENT AND PLAN / ED COURSE  Pertinent labs & imaging results that were available during my care of  the patient were reviewed by me and considered in my medical decision making (see chart for details).  Patient presented to the emergency department for shortness of breath and dizziness.  Patient has been short of breath with cough for the past 2 to 3 weeks per patient.  States largely unchanged from that perspective but today during a coughing spell she became dizzy and lightheaded.  States this resolved after several minutes.  His labs are largely within normal limits.  I sent a troponin which is also negative.  Chest x-ray is clear, free of pneumonia.  Patient feels well in the emergency department with a 96% room air saturation.  Husband is here with the patient states he just wanted to make sure she did not have pneumonia.  As the patient appears well with an overall normal work-up we will discharge home with PCP follow-up.  The patient is agreeable to this plan of care.  ____________________________________________   FINAL CLINICAL IMPRESSION(S) / ED DIAGNOSES  Dizziness Cough    Harvest Dark, MD 01/21/18 1905

## 2018-01-21 NOTE — ED Triage Notes (Signed)
Pt in via EMS with c/o SOB. EMS reports pt had pneumonia 3 weeks ago and not with productive cough again and dizziness. Pt HR 109, BP 119/77, 96%RA.

## 2018-01-21 NOTE — ED Notes (Signed)

## 2018-01-21 NOTE — ED Notes (Signed)
Per pt and pt brother, pt does not have a legal guardian of any sort, but lives with brother (listed in demographics). Pt to be d/c home with brother by cab

## 2018-01-31 ENCOUNTER — Emergency Department: Payer: Medicare Other

## 2018-01-31 ENCOUNTER — Emergency Department
Admission: EM | Admit: 2018-01-31 | Discharge: 2018-01-31 | Disposition: A | Discharge status: Home / Self Care | Payer: Medicare Other | Attending: Emergency Medicine | Admitting: Emergency Medicine

## 2018-01-31 ENCOUNTER — Other Ambulatory Visit: Payer: Self-pay

## 2018-01-31 ENCOUNTER — Encounter: Payer: Self-pay | Admitting: Emergency Medicine

## 2018-01-31 DIAGNOSIS — Z7901 Long term (current) use of anticoagulants: Secondary | ICD-10-CM | POA: Diagnosis not present

## 2018-01-31 DIAGNOSIS — E119 Type 2 diabetes mellitus without complications: Secondary | ICD-10-CM | POA: Diagnosis not present

## 2018-01-31 DIAGNOSIS — Z79899 Other long term (current) drug therapy: Secondary | ICD-10-CM | POA: Insufficient documentation

## 2018-01-31 DIAGNOSIS — Z87891 Personal history of nicotine dependence: Secondary | ICD-10-CM | POA: Diagnosis not present

## 2018-01-31 DIAGNOSIS — I10 Essential (primary) hypertension: Secondary | ICD-10-CM | POA: Diagnosis not present

## 2018-01-31 DIAGNOSIS — Z7984 Long term (current) use of oral hypoglycemic drugs: Secondary | ICD-10-CM | POA: Diagnosis not present

## 2018-01-31 DIAGNOSIS — R079 Chest pain, unspecified: Secondary | ICD-10-CM

## 2018-01-31 DIAGNOSIS — R05 Cough: Secondary | ICD-10-CM | POA: Diagnosis not present

## 2018-01-31 LAB — BASIC METABOLIC PANEL
Anion gap: 7 (ref 5–15)
BUN: 18 mg/dL (ref 6–20)
CO2: 27 mmol/L (ref 22–32)
Calcium: 8.2 mg/dL — ABNORMAL LOW (ref 8.9–10.3)
Chloride: 105 mmol/L (ref 101–111)
Creatinine, Ser: 0.67 mg/dL (ref 0.44–1.00)
GFR calc Af Amer: >60 mL/min (ref >60)
GFR calc non Af Amer: >60 mL/min (ref >60)
Glucose, Bld: 138 mg/dL — ABNORMAL HIGH (ref 65–99)
Potassium: 3.8 mmol/L (ref 3.5–5.1)
Sodium: 139 mmol/L (ref 135–145)

## 2018-01-31 LAB — CBC
HCT: 29.8 % — ABNORMAL LOW (ref 35.0–47.0)
Hemoglobin: 9.9 g/dL — ABNORMAL LOW (ref 12.0–16.0)
MCH: 26.8 pg (ref 26.0–34.0)
MCHC: 33.2 g/dL (ref 32.0–36.0)
MCV: 80.6 fL (ref 80.0–100.0)
Platelets: 299 10*3/uL (ref 150–440)
RBC: 3.7 MIL/uL — ABNORMAL LOW (ref 3.80–5.20)
RDW: 19.3 % — ABNORMAL HIGH (ref 11.5–14.5)
WBC: 7.6 10*3/uL (ref 3.6–11.0)

## 2018-01-31 LAB — TROPONIN I: Troponin I: <0.03 ng/mL (ref <0.03)

## 2018-01-31 MED ORDER — BENZONATATE 100 MG PO CAPS: 100.0000 mg | ORAL_CAPSULE | Freq: Four times a day (QID) | ORAL | 0 refills | Status: DC | PRN

## 2018-01-31 NOTE — ED Notes (Signed)
Patient continues to list pain as 10/10. States it has been at this level for 3 months.

## 2018-01-31 NOTE — ED Triage Notes (Signed)
L chest pain x 3 months, states got worse last night. States fell 3 months ago. States increases with inspiration.

## 2018-01-31 NOTE — ED Provider Notes (Signed)
[facility] Emergency Department Provider Note  Time seen: 9:26 AM  I have reviewed the triage vital signs and the nursing notes.   HISTORY  Chief Complaint Left chest pain   HPI Tracy Dickerson is a 77 y.o. female with a past medical history of diabetes, reflux, hypertension, presents to the emergency department for left chest discomfort and cough.  According to the patient approximately 3 or 4 months ago she fell out of a bus and landed onto her back and left shoulder.  States she has been having left-sided chest pain left shoulder left back pain ever since that fall.  She states in March she was diagnosed with pneumonia of the left lung.  She states over the past 2 weeks she has been coughing with white sputum production and increased discomfort in the left chest which concerned her that she might be developing pneumonia once again.  States her brother wanted her to come in so she came to the emergency department today to get checked out to make sure she is not have pneumonia once again.  Patient denies any fever.   Describes the chest pain as mild dull pain in the left chest worse with cough.   Past Medical History:  Diagnosis Date  . 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 Diabetes mellitus without complication ([facility])   . GERD (gastroesophageal reflux disease)   . Heart disease   . Hypertension     Patient Active Problem List   Diagnosis Date Noted  . Pneumonia 10/29/2017  . Hypotension 01/26/2017    Past Surgical History:  Procedure Laterality Date  . ABDOMINAL HYSTERECTOMY    . APPENDECTOMY    . BREAST SURGERY     breast cancer  . TONSILLECTOMY      Prior to Admission medications   Medication Sig Start Date End Date Taking? Authorizing Provider  ADVAIR DISKUS 250-50 MCG/DOSE AEPB Inhale 1 puff into the lungs 2 (two) times daily. 12/14/16   [provider]  albuterol (PROAIR HFA) 108 (90 Base) MCG/ACT inhaler Inhale 2 puffs into the lungs every  4 (four) hours as needed. 04/08/17   [provider]  atorvastatin (LIPITOR) 80 MG tablet Take 80 mg by mouth daily.    [provider]  diazepam (VALIUM) 5 MG tablet Take 1 tablet (5 mg total) by mouth every 8 (eight) hours as needed for muscle spasms. Patient not taking: Reported on 01/26/2017 06/08/15   Carrie Mew, MD  diphenhydrAMINE (BENADRYL) 25 mg capsule Take 2 capsules (50 mg total) by mouth every 6 (six) hours as needed. 06/08/15   Carrie Mew, MD  ferrous sulfate (EQL SLOW RELEASE IRON) 160 (50 Fe) MG TBCR SR tablet Take 1 tablet (160 mg total) by mouth daily. 04/18/17   Earleen Newport, MD  Ferrous Sulfate (SLOW FE) 142 (45 Fe) MG TBCR Take 1 tablet by mouth daily.    [provider]  gabapentin (NEURONTIN) 300 MG capsule Take 1 capsule (300 mg total) by mouth at bedtime. 11/24/17   Nance Pear, MD  metFORMIN (GLUCOPHAGE) 500 MG tablet Take 1 tablet (500 mg total) by mouth 2 (two) times daily. 02/06/15 01/26/18  Harvest Dark, MD  metoCLOPramide (REGLAN) 10 MG tablet Take 1 tablet (10 mg total) by mouth 4 (four) times daily -  before meals and at bedtime. Patient not taking: Reported on 01/26/2017 06/08/15   Carrie Mew, MD  metoprolol tartrate (LOPRESSOR) 25 MG tablet Take 1 tablet (25  mg total) by mouth 2 (two) times daily. Take IF BP >130 (top number) Patient not taking: Reported on 10/29/2017 01/27/17   Bettey Costa, MD  naproxen (NAPROSYN) 500 MG tablet Take 1 tablet (500 mg total) by mouth 2 (two) times daily with a meal. Patient not taking: Reported on 01/26/2017 06/08/15   Carrie Mew, MD  omeprazole (PRILOSEC) 40 MG capsule Take 40 mg by mouth daily. 01/15/17   [provider]  pramipexole (MIRAPEX) 0.25 MG tablet Take 0.25 mg by mouth at bedtime. 12/23/16   [provider]  traMADol (ULTRAM) 50 MG tablet Take 1 tablet (50 mg total) by mouth every 6 (six) hours as needed. 04/18/17 04/18/18  Merlyn Lot, MD   triamterene-hydrochlorothiazide (MAXZIDE-25) 37.5-25 MG tablet Take 1 tablet by mouth daily.    [provider]  warfarin (COUMADIN) 2.5 MG tablet Take 3.75-5 mg by mouth daily. Take 3.75MG  daily on Tuesday, Thursday and Saturday and then 5MG  daily all other days 01/21/17   [provider]    Allergies  Allergen Reactions  . Percocet [Oxycodone-Acetaminophen]   . Penicillins Rash    Family History  Problem Relation Age of Onset  . Cancer Mother     Social History Social History   Tobacco Use  . Smoking status: Former Research scientist (life sciences)  . Smokeless tobacco: Never Used  Substance Use Topics  . Alcohol use: Yes  . Drug use: No    Review of Systems Constitutional: Negative for fever. Eyes: Negative for visual complaints ENT: Negative for recent illness/congestion Cardiovascular: Left chest wall pain Respiratory: Negative for shortness of breath.  Positive for cough. Gastrointestinal: Negative for abdominal pain.  Negative for nausea. Genitourinary: Negative for urinary compaints Musculoskeletal: Continued left shoulder pain since the fall 4 months ago.  No leg pain. Skin: Negative for skin complaints  Neurological: Negative for headache All other ROS negative  ____________________________________________   PHYSICAL EXAM:  Constitutional: Alert and oriented. Well appearing and in no distress. Eyes: Normal exam ENT   Head: Normocephalic and atraumatic.   Mouth/Throat: Mucous membranes are moist. Cardiovascular: Normal rate, regular rhythm. No murmur Respiratory: Normal respiratory effort without tachypnea nor retractions. Breath sounds are clear  Gastrointestinal: Soft and nontender. No distention.  Musculoskeletal: Nontender with normal range of motion in all extremities. No lower extremity tenderness Neurologic:  Normal speech and language. No gross focal neurologic deficits  Skin:  Skin is warm, dry and intact.  Psychiatric: Mood and affect are normal.    ____________________________________________    EKG  EKG reviewed and interpreted by myself shows normal sinus rhythm at 79 bpm with a slightly widened QRS, normal axis, largely normal intervals, no concerning ST changes.  EKG morphology largely unchanged from prior EKG.  ____________________________________________    RADIOLOGY  X-ray shows no acute abnormality  ____________________________________________   INITIAL IMPRESSION / ASSESSMENT AND PLAN / ED COURSE  Pertinent labs & imaging results that were available during my care of the patient were reviewed by me and considered in my medical decision making (see chart for details).  Patient presents to the emergency department for left chest pain and cough.  Patient states she had a fall 4 months ago and has had pain to this area ever since.  Cough for the past [redacted] weeks along with white sputum production.  History of pneumonia approximately 2 months ago.  Differential would include pneumonia, chest wall pain, pleurisy, rib fracture or contusion as.  We will check labs including cardiac enzymes, chest x-ray, EKG and  continue to closely monitor.  X-ray shows no acute abnormality.  Labs are largely within normal limits/baseline for the patient.  Troponin negative.  EKG reassuring.  Overall the patient appears extremely well in the emergency department.  We will discharge the patient home with PCP follow-up.  Patient agreeable to this plan of care.  ____________________________________________   FINAL CLINICAL IMPRESSION(S) / ED DIAGNOSES  Chest pain Cough    Harvest Dark, MD 01/31/18 1034

## 2018-01-31 NOTE — Discharge Instructions (Addendum)
You have been seen in the emergency department today for chest pain. Your workup has shown normal results. As we discussed please follow-up with your primary care physician in the next 1-2 days for recheck. Return to the emergency department for any further chest pain, trouble breathing, or any other symptom personally concerning to yourself. °

## 2018-02-02 ENCOUNTER — Observation Stay
Admission: EM | Admit: 2018-02-02 | Discharge: 2018-02-05 | Disposition: A | Discharge status: Skilled Nursing Facility | Payer: Medicare Other | Attending: Surgery | Admitting: Surgery

## 2018-02-02 ENCOUNTER — Encounter: Payer: Self-pay | Admitting: *Deleted

## 2018-02-02 ENCOUNTER — Other Ambulatory Visit: Payer: Self-pay

## 2018-02-02 ENCOUNTER — Emergency Department: Payer: Medicare Other

## 2018-02-02 DIAGNOSIS — Z880 Allergy status to penicillin: Secondary | ICD-10-CM | POA: Insufficient documentation

## 2018-02-02 DIAGNOSIS — Z853 Personal history of malignant neoplasm of breast: Secondary | ICD-10-CM | POA: Diagnosis not present

## 2018-02-02 DIAGNOSIS — Z7901 Long term (current) use of anticoagulants: Secondary | ICD-10-CM | POA: Insufficient documentation

## 2018-02-02 DIAGNOSIS — K5900 Constipation, unspecified: Secondary | ICD-10-CM | POA: Insufficient documentation

## 2018-02-02 DIAGNOSIS — Z79899 Other long term (current) drug therapy: Secondary | ICD-10-CM | POA: Insufficient documentation

## 2018-02-02 DIAGNOSIS — W01198A Fall on same level from slipping, tripping and stumbling with subsequent striking against other object, initial encounter: Secondary | ICD-10-CM | POA: Diagnosis not present

## 2018-02-02 DIAGNOSIS — E119 Type 2 diabetes mellitus without complications: Secondary | ICD-10-CM | POA: Diagnosis not present

## 2018-02-02 DIAGNOSIS — J189 Pneumonia, unspecified organism: Secondary | ICD-10-CM | POA: Diagnosis not present

## 2018-02-02 DIAGNOSIS — Z7951 Long term (current) use of inhaled steroids: Secondary | ICD-10-CM | POA: Insufficient documentation

## 2018-02-02 DIAGNOSIS — Z809 Family history of malignant neoplasm, unspecified: Secondary | ICD-10-CM | POA: Insufficient documentation

## 2018-02-02 DIAGNOSIS — Y9389 Activity, other specified: Secondary | ICD-10-CM | POA: Diagnosis not present

## 2018-02-02 DIAGNOSIS — Z87891 Personal history of nicotine dependence: Secondary | ICD-10-CM | POA: Diagnosis not present

## 2018-02-02 DIAGNOSIS — Z85118 Personal history of other malignant neoplasm of bronchus and lung: Secondary | ICD-10-CM | POA: Insufficient documentation

## 2018-02-02 DIAGNOSIS — S80212A Abrasion, left knee, initial encounter: Secondary | ICD-10-CM | POA: Diagnosis not present

## 2018-02-02 DIAGNOSIS — G4730 Sleep apnea, unspecified: Secondary | ICD-10-CM | POA: Insufficient documentation

## 2018-02-02 DIAGNOSIS — S82452A Displaced comminuted fracture of shaft of left fibula, initial encounter for closed fracture: Secondary | ICD-10-CM

## 2018-02-02 DIAGNOSIS — Z90710 Acquired absence of both cervix and uterus: Secondary | ICD-10-CM | POA: Diagnosis not present

## 2018-02-02 DIAGNOSIS — I959 Hypotension, unspecified: Secondary | ICD-10-CM | POA: Diagnosis not present

## 2018-02-02 DIAGNOSIS — K219 Gastro-esophageal reflux disease without esophagitis: Secondary | ICD-10-CM | POA: Diagnosis not present

## 2018-02-02 DIAGNOSIS — R079 Chest pain, unspecified: Secondary | ICD-10-CM | POA: Insufficient documentation

## 2018-02-02 DIAGNOSIS — I519 Heart disease, unspecified: Secondary | ICD-10-CM | POA: Insufficient documentation

## 2018-02-02 DIAGNOSIS — S82252A Displaced comminuted fracture of shaft of left tibia, initial encounter for closed fracture: Secondary | ICD-10-CM

## 2018-02-02 DIAGNOSIS — Z419 Encounter for procedure for purposes other than remedying health state, unspecified: Secondary | ICD-10-CM

## 2018-02-02 DIAGNOSIS — R0602 Shortness of breath: Secondary | ICD-10-CM | POA: Diagnosis not present

## 2018-02-02 DIAGNOSIS — I10 Essential (primary) hypertension: Secondary | ICD-10-CM | POA: Insufficient documentation

## 2018-02-02 DIAGNOSIS — M79641 Pain in right hand: Secondary | ICD-10-CM | POA: Insufficient documentation

## 2018-02-02 DIAGNOSIS — S82392A Other fracture of lower end of left tibia, initial encounter for closed fracture: Principal | ICD-10-CM | POA: Insufficient documentation

## 2018-02-02 DIAGNOSIS — Z888 Allergy status to other drugs, medicaments and biological substances status: Secondary | ICD-10-CM | POA: Insufficient documentation

## 2018-02-02 DIAGNOSIS — S60221A Contusion of right hand, initial encounter: Secondary | ICD-10-CM | POA: Insufficient documentation

## 2018-02-02 DIAGNOSIS — S82309A Unspecified fracture of lower end of unspecified tibia, initial encounter for closed fracture: Secondary | ICD-10-CM | POA: Diagnosis present

## 2018-02-02 DIAGNOSIS — W19XXXA Unspecified fall, initial encounter: Secondary | ICD-10-CM

## 2018-02-02 DIAGNOSIS — Z885 Allergy status to narcotic agent status: Secondary | ICD-10-CM | POA: Diagnosis not present

## 2018-02-02 DIAGNOSIS — Y92090 Kitchen in other non-institutional residence as the place of occurrence of the external cause: Secondary | ICD-10-CM | POA: Insufficient documentation

## 2018-02-02 DIAGNOSIS — S82832A Other fracture of upper and lower end of left fibula, initial encounter for closed fracture: Secondary | ICD-10-CM | POA: Diagnosis not present

## 2018-02-02 DIAGNOSIS — R42 Dizziness and giddiness: Secondary | ICD-10-CM | POA: Insufficient documentation

## 2018-02-02 DIAGNOSIS — Z7984 Long term (current) use of oral hypoglycemic drugs: Secondary | ICD-10-CM | POA: Insufficient documentation

## 2018-02-02 DIAGNOSIS — R109 Unspecified abdominal pain: Secondary | ICD-10-CM

## 2018-02-02 DIAGNOSIS — Z6839 Body mass index (BMI) 39.0-39.9, adult: Secondary | ICD-10-CM | POA: Insufficient documentation

## 2018-02-02 LAB — CBC
HCT: 33.1 % — ABNORMAL LOW (ref 35.0–47.0)
Hemoglobin: 10.8 g/dL — ABNORMAL LOW (ref 12.0–16.0)
MCH: 26.2 pg (ref 26.0–34.0)
MCHC: 32.7 g/dL (ref 32.0–36.0)
MCV: 80 fL (ref 80.0–100.0)
Platelets: 277 10*3/uL (ref 150–440)
RBC: 4.14 MIL/uL (ref 3.80–5.20)
RDW: 19 % — ABNORMAL HIGH (ref 11.5–14.5)
WBC: 8.4 10*3/uL (ref 3.6–11.0)

## 2018-02-02 LAB — BASIC METABOLIC PANEL
Anion gap: 8 (ref 5–15)
BUN: 15 mg/dL (ref 6–20)
CO2: 26 mmol/L (ref 22–32)
Calcium: 8.6 mg/dL — ABNORMAL LOW (ref 8.9–10.3)
Chloride: 104 mmol/L (ref 101–111)
Creatinine, Ser: 0.68 mg/dL (ref 0.44–1.00)
GFR calc Af Amer: >60 mL/min (ref >60)
GFR calc non Af Amer: >60 mL/min (ref >60)
Glucose, Bld: 182 mg/dL — ABNORMAL HIGH (ref 65–99)
Potassium: 3.5 mmol/L (ref 3.5–5.1)
Sodium: 138 mmol/L (ref 135–145)

## 2018-02-02 LAB — PROTIME-INR
INR: 2.16
Prothrombin Time: 23.9 seconds — ABNORMAL HIGH (ref 11.4–15.2)

## 2018-02-02 LAB — GLUCOSE, CAPILLARY: Glucose-Capillary: 130 mg/dL — ABNORMAL HIGH (ref 65–99)

## 2018-02-02 LAB — APTT: aPTT: 36 seconds (ref 24–36)

## 2018-02-02 MED ORDER — HYDROMORPHONE HCL 1 MG/ML IJ SOLN
0.5000 mg | Freq: Once | INTRAMUSCULAR | Status: AC
  Administered 2018-02-02: 0.5 mg via INTRAVENOUS
  Filled 2018-02-02: qty 1

## 2018-02-02 MED ORDER — DEXTROSE 5 % IV SOLN
1.0000 mg | Freq: Once | INTRAVENOUS | Status: AC
  Administered 2018-02-02: 1 mg via INTRAVENOUS
  Filled 2018-02-02: qty 0.5

## 2018-02-02 MED ORDER — MAGNESIUM HYDROXIDE 400 MG/5ML PO SUSP: 30.0000 mL | Freq: Every day | ORAL | Status: DC | PRN

## 2018-02-02 MED ORDER — FENTANYL CITRATE (PF) 100 MCG/2ML IJ SOLN
100.0000 ug | Freq: Once | INTRAMUSCULAR | Status: AC
  Administered 2018-02-02: 100 ug via INTRAVENOUS
  Filled 2018-02-02: qty 2

## 2018-02-02 MED ORDER — ACETAMINOPHEN 650 MG RE SUPP: 650.0000 mg | Freq: Four times a day (QID) | RECTAL | Status: DC | PRN

## 2018-02-02 MED ORDER — CLINDAMYCIN PHOSPHATE 900 MG/50ML IV SOLN
900.0000 mg | Freq: Once | INTRAVENOUS | Status: AC
  Administered 2018-02-03: 900 mg via INTRAVENOUS
  Filled 2018-02-02: qty 50

## 2018-02-02 MED ORDER — PANTOPRAZOLE SODIUM 40 MG PO TBEC
40.0000 mg | DELAYED_RELEASE_TABLET | Freq: Every day | ORAL | Status: DC
  Administered 2018-02-02 – 2018-02-03 (×2): 40 mg via ORAL
  Filled 2018-02-02 (×2): qty 1

## 2018-02-02 MED ORDER — BACITRACIN-NEOMYCIN-POLYMYXIN 400-5-5000 EX OINT
TOPICAL_OINTMENT | Freq: Once | CUTANEOUS | Status: AC
Start: 2018-02-02 — End: 2018-02-02
  Administered 2018-02-02: 1 via TOPICAL
  Filled 2018-02-02: qty 1

## 2018-02-02 MED ORDER — HYDROMORPHONE HCL 1 MG/ML IJ SOLN
0.5000 mg | INTRAMUSCULAR | Status: AC | PRN
  Administered 2018-02-02 (×2): 0.5 mg via INTRAVENOUS
  Filled 2018-02-02 (×2): qty 1

## 2018-02-02 MED ORDER — KCL IN DEXTROSE-NACL 20-5-0.9 MEQ/L-%-% IV SOLN
INTRAVENOUS | Status: DC
  Administered 2018-02-02 – 2018-02-03 (×2): via INTRAVENOUS
  Filled 2018-02-02 (×4): qty 1000

## 2018-02-02 MED ORDER — WARFARIN - PHARMACIST DOSING INPATIENT
Freq: Every day | Status: DC
  Filled 2018-02-02 (×2): qty 1

## 2018-02-02 MED ORDER — DOCUSATE SODIUM 100 MG PO CAPS
100.0000 mg | ORAL_CAPSULE | Freq: Two times a day (BID) | ORAL | Status: DC
  Administered 2018-02-02: 100 mg via ORAL
  Filled 2018-02-02: qty 1

## 2018-02-02 MED ORDER — ACETAMINOPHEN 325 MG PO TABS
650.0000 mg | ORAL_TABLET | Freq: Four times a day (QID) | ORAL | Status: DC | PRN
Start: 2018-02-02 — End: 2018-02-03

## 2018-02-02 MED ORDER — IPRATROPIUM-ALBUTEROL 0.5-2.5 (3) MG/3ML IN SOLN
3.0000 mL | Freq: Once | RESPIRATORY_TRACT | Status: AC
  Administered 2018-02-02: 3 mL via RESPIRATORY_TRACT
  Filled 2018-02-02: qty 3

## 2018-02-02 MED ORDER — TETANUS-DIPHTH-ACELL PERTUSSIS 5-2.5-18.5 LF-MCG/0.5 IM SUSP
0.5000 mL | Freq: Once | INTRAMUSCULAR | Status: AC
  Administered 2018-02-02: 0.5 mL via INTRAMUSCULAR
  Filled 2018-02-02: qty 0.5

## 2018-02-02 MED ORDER — INSULIN ASPART 100 UNIT/ML ~~LOC~~ SOLN
0.0000 [IU] | Freq: Three times a day (TID) | SUBCUTANEOUS | Status: DC
  Administered 2018-02-02 – 2018-02-04 (×5): 1 [IU] via SUBCUTANEOUS
  Filled 2018-02-02 (×5): qty 1

## 2018-02-02 MED ORDER — SODIUM CHLORIDE 0.9 % IV BOLUS
1000.0000 mL | Freq: Once | INTRAVENOUS | Status: AC
  Administered 2018-02-02: 1000 mL via INTRAVENOUS

## 2018-02-02 MED ORDER — BISACODYL 10 MG RE SUPP: 10.0000 mg | Freq: Every day | RECTAL | Status: DC | PRN

## 2018-02-02 MED ORDER — VITAMIN K1 10 MG/ML IJ SOLN
1.0000 mg | Freq: Once | INTRAVENOUS | Status: AC
  Administered 2018-02-02: 1 mg via INTRAVENOUS
  Filled 2018-02-02 (×2): qty 0.1

## 2018-02-02 MED ORDER — HYDROMORPHONE HCL 1 MG/ML IJ SOLN
0.5000 mg | INTRAMUSCULAR | Status: DC | PRN
  Administered 2018-02-02: 1 mg via INTRAVENOUS
  Filled 2018-02-02: qty 1

## 2018-02-02 MED ORDER — FLEET ENEMA 7-19 GM/118ML RE ENEM: 1.0000 | ENEMA | Freq: Once | RECTAL | Status: DC | PRN

## 2018-02-02 MED ORDER — OXYCODONE HCL 5 MG PO TABS
5.0000 mg | ORAL_TABLET | ORAL | Status: DC | PRN
  Administered 2018-02-03 (×4): 10 mg via ORAL
  Filled 2018-02-02 (×4): qty 2

## 2018-02-02 NOTE — Progress Notes (Signed)
ANTICOAGULATION CONSULT NOTE - Initial Consult  Pharmacy Consult for Warfarin Indication: atrial fibrillation  Allergies  Allergen Reactions  . Percocet [Oxycodone-Acetaminophen]   . Penicillins Rash    Has patient had a PCN reaction causing immediate rash, facial/tongue/throat swelling, SOB or lightheadedness with hypotension: Yes Has patient had a PCN reaction causing severe rash involving mucus membranes or skin necrosis: Unknown Has patient had a PCN reaction that required hospitalization: Unknown Has patient had a PCN reaction occurring within the last 10 years: No If all of the above answers are "NO", then may proceed with Cephalosporin use.    Patient Measurements: Height: 5\' 6"  (167.6 cm) Weight: 246 lb (111.6 kg) IBW/kg (Calculated) : 59.3 Heparin Dosing Weight:    Vital Signs: Temp: 97.6 F (36.4 C) (05/13 1124) Temp Source: Oral (05/13 1124) BP: 123/52 (05/13 1230) Pulse Rate: 98 (05/13 1230)  Labs: Recent Labs    01/31/18 0936 02/02/18 1256  HGB 9.9* 10.8*  HCT 29.8* 33.1*  PLT 299 277  APTT  --  36  LABPROT  --  23.9*  INR  --  2.16  CREATININE 0.67 0.68  TROPONINI <0.03  --     Estimated Creatinine Clearance: 78.1 mL/min (by C-G formula based on SCr of 0.68 mg/dL).   Medical History: Past Medical History:  Diagnosis Date  . 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 Diabetes mellitus without complication ([place])   . GERD (gastroesophageal reflux disease)   . Heart disease   . Hypertension     Medications:  Scheduled:  . insulin aspart  0-9 Units Subcutaneous TID WC  . [START ON 02/03/2018] Warfarin - Pharmacist Dosing Inpatient   Does not apply q1800   Infusions:  . [START ON 02/03/2018] clindamycin (CLEOCIN) IV    . phytonadione (VITAMIN K) IV      Assessment: 77 yo F with ankle fracture, on Warfarin PTA for Afib.  Home dose per Anticoagulation visit note from 01/29/18: 3.75 mg on Thursdays and 5 mg on all other days. Hgb 10.8,  Plt 277  5/13   INR  2.16      * Vit K 1 mg IV x 1 ordered d/t planned surgery for ankle fracture   Goal of Therapy:  INR 2-3 Monitor platelets by anticoagulation protocol: Yes   Plan:  Will hold Warfarin tonight as patient for probable surgery tomorrow. Patient has Vit K 1 mg ordered- not charted as given yet. F/u INR in am.  Kani Chauvin A 02/02/2018,2:35 PM

## 2018-02-02 NOTE — H&P (Signed)
Subjective:  Chief complaint: Left knee and ankle pain.  The patient is a 77 y.o. female who sustained an injury to the left lower extremity earlier this morning. Apparently she had just gotten home from the grocery store and was starting to unpack her groceries when she slipped on some water on her kitchen floor and felt her ankle give way. As she fell to the floor, she struck her knee against the lower part of a door. She was unable to get up so the EMS squad was called and she was brought to the emergency room where x-rays demonstrated a displaced left distal tib-fib fracture as well as a left proximal fibular fracture. The patient denies any associated injuries resulting from this fall. She did not strike her head or lose consciousness. In addition, she denies any light-headedness, loss of consciousness, chest pain, or shortness of breath which might have contributed to the injury.  Patient Active Problem List   Diagnosis Date Noted  . Pneumonia 10/29/2017  . Hypotension 01/26/2017   Past Medical History:  Diagnosis Date  . Cancer ([place])    Breast and lung  . Diabetes mellitus without complication ([place])   . GERD (gastroesophageal reflux disease)   . Heart disease   . Hypertension     Past Surgical History:  Procedure Laterality Date  . ABDOMINAL HYSTERECTOMY    . APPENDECTOMY    . BREAST SURGERY     breast cancer  . TONSILLECTOMY       (Not in a hospital admission) Allergies  Allergen Reactions  . Percocet [Oxycodone-Acetaminophen]   . Penicillins Rash    Has patient had a PCN reaction causing immediate rash, facial/tongue/throat swelling, SOB or lightheadedness with hypotension: Yes Has patient had a PCN reaction causing severe rash involving mucus membranes or skin necrosis: Unknown Has patient had a PCN reaction that required hospitalization: Unknown Has patient had a PCN reaction occurring within the last 10 years: No If all of the above answers are "NO", then may proceed  with Cephalosporin use.    Social History   Tobacco Use  . Smoking status: Former Research scientist (life sciences)  . Smokeless tobacco: Never Used  Substance Use Topics  . Alcohol use: Yes    Family History  Problem Relation Age of Onset  . Cancer Mother      Review of Systems: As noted above. The patient denies any chest pain, shortness of breath, nausea, vomiting, diarrhea, constipation, belly pain, blood in her stool, or burning with urination.  Objective: Temp:  [97.6 F (36.4 C)] 97.6 F (36.4 C) (05/13 1124) Pulse Rate:  [85-103] 88 (05/13 1700) Resp:  [14-17] 16 (05/13 1700) BP: (112-141)/(52-81) 112/64 (05/13 1700) SpO2:  [94 %-100 %] 95 % (05/13 1700) Weight:  [111.6 kg (246 lb)] 111.6 kg (246 lb) (05/13 1124)  Physical Exam: General:  Alert, no acute distress Psychiatric:  Patient is competent for consent with normal mood and affect  Cardiovascular:  RRR  Respiratory:  Clear to auscultation. No wheezing. Non-labored breathing GI:  Abdomen is soft and non-tender Skin:  No lesions in the area of chief complaint Neurologic:  Sensation intact distally Lymphatic:  No axillary or cervical lymphadenopathy  Orthopedic Exam:  Orthopedic examination is limited to the left foot and lower leg.  The patient's ankle is in a posterior splint with a sugar tong supplement, maintaining the ankle and near neutral dorsiflexion.  There is a superficial abrasion over the patella on the anterior aspect of her knee without any surrounding  erythema.  She has some swelling over the lateral aspect of the proximal lower leg and has tenderness to palpation over the proximal fibula.  The patient is able to flex and extend her toes slightly, although motion is limited secondary to pain and apprehension.  Sensation is intact to light touch to all digits as well as to the first webspace.  She has good capillary refill to all digits.  Imaging Review: Recent x-rays of the left ankle are available for review.  These films  demonstrate a mildly displaced fracture of the left distal tibia without evidence of intra-articular extension.  There also is a comminuted mildly angulated fracture of the distal fibula.  No significant degenerative changes of the ankle are identified.  X-rays of the left knee also are obtained.  These films demonstrate an essentially nondisplaced fracture of the proximal fibular neck.  No other bony abnormalities are identified in the knee.  No significant degenerative changes are noted.  Assessment: 1.  Closed displaced distal left tib-fib fracture. 2.  Closed nondisplaced proximal left fibular fracture.  Plan: The treatment options, including both surgical and nonsurgical choices, have been discussed in detail with the patient. The patient would like to proceed with surgical intervention to include an open reduction and internal fixation of the distal fibular fracture. The risks (including bleeding, infection, nerve and/or blood vessel injury, persistent or recurrent pain, loosening or failure of the components, leg length inequality, dislocation, need for further surgery, blood clots, strokes, heart attacks or arrhythmias, pneumonia, etc.) and benefits of the surgical procedure were discussed. The patient states her understanding and agrees to proceed. A formal written consent will be obtained by the nursing staff.

## 2018-02-02 NOTE — Progress Notes (Signed)
Inpatient Diabetes Program Recommendations  AACE/ADA: New Consensus Statement on Inpatient Glycemic Control (2015)  Target Ranges:  Prepandial:   less than 140 mg/dL      Peak postprandial:   less than 180 mg/dL (1-2 hours)      Critically ill patients:  140 - 180 mg/dL   Results for Tracy Dickerson, Tracy Dickerson (MRN [id number]) as of 02/02/2018 14:12  Ref. Range 02/02/2018 12:56  Glucose Latest Ref Range: 65 - 99 mg/dL 182 (H)   Results for Tracy Dickerson, Tracy Dickerson (MRN [id number]) as of 02/02/2018 14:12  Ref. Range 10/29/2017 17:12  Hemoglobin A1C Latest Ref Range: 4.8 - 5.6 % 6.7 (H)    Admit with: Fall and Ankle Pain  History: DM  Home DM Meds: Metformin 500 mg BID  Current Insulin Orders: Novolog Sensitive Correction Scale/ SSI (0-9 units) TID AC + HS      Note Novolog Sensitive Correction Scale/ SSI (0-9 units) TID AC + HS to start at 5pm this afternoon.  Agree with current orders.  May consider checking current A1c level.  Last one on file was 6.7% back on 10/29/2017.      --Will follow patient during hospitalization--  Wyn Quaker RN, MSN, CDE Diabetes Coordinator Inpatient Glycemic Control Team Team Pager: [phone] (8a-5p)

## 2018-02-02 NOTE — Progress Notes (Signed)
Family Meeting Note  Advance Directive:no  Today a meeting took place with the Patient and patient's brother. The following clinical team members were present during this meeting:MD  The following were discussed:Patient's diagnosis: Ankle fracture Atrial fibrillation on anticoagulation, Patient's progosis: Unable to determine and Goals for treatment: DNR  Additional follow-up to be provided: Patient would like chaplain consultation to start advanced directive planning  Time spent during discussion: 16 minutes  Khole Branch, MD

## 2018-02-02 NOTE — ED Provider Notes (Addendum)
[facility] Emergency Department Provider Note  ____________________________________________  Time seen: Approximately 11:27 AM  I have reviewed the triage vital signs and the nursing notes.   HISTORY  Chief Complaint Fall and Ankle Pain    HPI Tracy Dickerson is a 77 y.o. female with a history of breast and lung cancer, HTN, CAD, DM, presenting with left ankle pain and deformity after a fall.  The patient reports she was at home and helping put away groceries when she lost her balance and slipped.  She did not hit her head or lose consciousness.  She has no neck or back pain, headache, numbness tingling or weakness.  She did not have any associated chest pain, palpitations, shortness of breath.  Upon arrival, EMS noted her to have a reassuring blood sugar, and a deformity to the left distal leg with normal DP and PT pulses.  The patient has not had anything for pain as EMS was unable to establish IV access.  Recent completion of outpatient treatment for pneumonia with improved cough and no fever/chills/n/v.  Past Medical History:  Diagnosis Date  . Cancer ([place])    Breast and lung  . Diabetes mellitus without complication ([place])   . GERD (gastroesophageal reflux disease)   . Heart disease   . Hypertension     Patient Active Problem List   Diagnosis Date Noted  . Pneumonia 10/29/2017  . Hypotension 01/26/2017    Past Surgical History:  Procedure Laterality Date  . ABDOMINAL HYSTERECTOMY    . APPENDECTOMY    . BREAST SURGERY     breast cancer  . TONSILLECTOMY      Current Outpatient Rx  . Order #: [id number] Class: Historical Med  . Order #: [id number] Class: Historical Med  . Order #: [id number] Class: Historical Med  . Order #: [id number] Class: Historical Med  . Order #: [id number] Class: Print  . Order #: [id number] Class: Historical Med  . Order #: [id number] Class: Print  . Order #: [id number] Class: Historical Med  . Order #: [id number] Class: Historical Med   . Order #: [id number] Class: Historical Med  . Order #: [id number] Class: Historical Med  . Order #: [id number] Class: Historical Med  . Order #: [id number] Class: Historical Med  . Order #: [id number] Class: Print  . Order #: [id number] Class: Print  . Order #: [id number] Class: Print  . Order #: [id number] Class: Print  . Order #: [id number] Class: Print  . Order #: [id number] Class: No Print  . Order #: [id number] Class: Print  . Order #: [id number] Class: Print    Allergies Percocet [oxycodone-acetaminophen] and Penicillins  Family History  Problem Relation Age of Onset  . Cancer Mother     Social History Social History   Tobacco Use  . Smoking status: Former Research scientist (life sciences)  . Smokeless tobacco: Never Used  Substance Use Topics  . Alcohol use: Yes  . Drug use: No    Review of Systems Constitutional: No fever/chills.  No lightheadedness or syncope.  Positive mechanical fall without loss of consciousness. Eyes: No visual changes.  No blurred or double vision. ENT: No sore throat. No congestion or rhinorrhea. Cardiovascular: Denies chest pain. Denies palpitations. Respiratory: Chronic unchanged shortness of breath.  Improving cough. Gastrointestinal: No abdominal pain.  No nausea, no vomiting.  No diarrhea.  No constipation. Genitourinary: Negative for dysuria. Musculoskeletal: Negative for back pain.  No neck pain.  Positive left ankle pain and deformity. Skin: Negative for rash. Neurological: Negative for headaches. No focal numbness, tingling or weakness.     ____________________________________________   PHYSICAL EXAM:  VITAL  SIGNS: ED Triage Vitals  Enc Vitals Group     BP 02/02/18 1123 (!) 141/81     Pulse Rate 02/02/18 1123 (!) 103     Resp 02/02/18 1123 16     Temp 02/02/18 1124 97.6 F (36.4 C)     Temp Source 02/02/18 1124 Oral     SpO2 02/02/18 1123 98 %     Weight 02/02/18 1124 246 lb (111.6 kg)     Height 02/02/18 1124 5\' 6"  (1.676 m)     Head Circumference --       Peak Flow --      Pain Score 02/02/18 1125 10     Pain Loc --      Pain Edu? --      Excl. in [place]? --     Constitutional: Alert and oriented.  Chronically ill appearing and uncomfortable with positional changes due to left ankle pain but no toxic appearance. Answers questions appropriately. Eyes: Conjunctivae are normal.  EOMI. No scleral icterus.  No raccoon eyes. Head: Atraumatic.  No battle sign. Nose: No congestion/rhinnorhea.  No swelling over the nose or septal hematoma. Mouth/Throat: Mucous membranes are dry.  Neck: No stridor.  Supple.  No JVD.  No meningismus.  No midline C-spine tenderness to palpation, step-offs or deformities. Cardiovascular: Normal rate, regular rhythm. No murmurs, rubs or gallops.  Respiratory: Mildly tachypneic with mild accessory muscle use but no retractions.  The patient does have rales in the right lung base.  She has a prolonged expiratory phase.  No wheezes or rhonchi. Gastrointestinal: Morbidly obese.  Soft, nontender and nondistended.  No guarding or rebound.  No peritoneal signs. Musculoskeletal: Full range of motion of the bilateral shoulders, elbows, and wrist without pain.  The patient does have significant swelling and bruising on the medial aspect, palmar surface of the right hand.  She has 5 out of 5 grip strength in the right hand.  Full range of motion without pain of the bilateral hips, right knee, right ankle.  Left knee examination is limited due to pain in the left ankle but the patient does have a 3 x 3 cm abrasion over the central aspect of the patella.  The patient has obvious deformity in the ankle with no skin break; there is lateral displacement of the tibia amp fibula.  Patient has normal DP and PT pulses bilaterally.  She has normal sensation to light touch throughout the bilateral lower extremities.  No thoracic or lumbar spine tenderness to palpation, step-offs or deformities. Neurologic:  A&Ox3.  Speech is clear.  Face and smile are  symmetric.  EOMI.  Moves all extremities well. Skin:  Skin is warm, dry and intact. No rash noted. Psychiatric: Mood and affect are normal. Speech and behavior are normal.  Normal judgement  ____________________________________________   LABS (all labs ordered are listed, but only abnormal results are displayed)  Labs Reviewed  CBC  BASIC METABOLIC PANEL  PROTIME-INR  APTT  TYPE AND SCREEN   ____________________________________________  EKG  ED ECG REPORT I, Eula Listen, the attending physician, personally viewed and interpreted this ECG.   Date: 02/02/2018  EKG Time: 1151  Rate: 93  Rhythm: normal sinus rhythm  Axis: leftward  Intervals:none  ST&T Change: No STEMI  ____________________________________________  RADIOLOGY  Dg Chest 1 View  Result Date: 02/02/2018 CLINICAL DATA:  Recent fall EXAM: CHEST  1 VIEW COMPARISON:  01/31/2018 FINDINGS: Cardiac shadow is within normal limits. Lungs are well aerated with some mild volume  loss on the right secondary to prior surgery. No focal infiltrate or sizable effusion is noted. No bony abnormality is seen. IMPRESSION: Postsurgical changes with volume loss on the right. No acute abnormality noted. Electronically Signed   By: Inez Catalina M.D.   On: 02/02/2018 12:21   Dg Knee 2 Views Left  Result Date: 02/02/2018 CLINICAL DATA:  Recent fall with left knee pain, initial encounter EXAM: LEFT KNEE - 2 VIEW COMPARISON:  None. FINDINGS: Minimally displaced fracture of the left fibular neck is noted. No soft tissue abnormality is seen. No other fracture is noted. IMPRESSION: Proximal fibular fracture with minimal displacement. Electronically Signed   By: Inez Catalina M.D.   On: 02/02/2018 12:24   Dg Ankle Complete Left  Result Date: 02/02/2018 CLINICAL DATA:  Recent fall with left ankle pain, initial encounter EXAM: LEFT ANKLE COMPLETE - 3+ VIEW COMPARISON:  None. FINDINGS: Comminuted fracture of the distal tibial metaphysis is  seen. Additionally oblique fracture through the distal fibula is noted. Mild posterior angulation of the distal tibial fracture fragments is noted. Calcaneal spurring is seen. IMPRESSION: Distal tibial and fibular fractures as described. Electronically Signed   By: Inez Catalina M.D.   On: 02/02/2018 12:21   Dg Hand 2 View Right  Result Date: 02/02/2018 CLINICAL DATA:  Recent fall with right hand pain, initial encounter EXAM: RIGHT HAND - 2 VIEW COMPARISON:  10/31/2016 FINDINGS: No acute fracture or dislocation is noted. Mild degenerative changes in the first [facility] joint are noted. No soft tissue abnormality is seen. IMPRESSION: No acute abnormality noted. Electronically Signed   By: Inez Catalina M.D.   On: 02/02/2018 12:22    ____________________________________________   PROCEDURES  Procedure(s) performed: None  .Splint Application Date/Time: 09/28/2692 1:13 PM Performed by: Eula Listen, MD Authorized by: Eula Listen, MD   Consent:    Consent obtained:  Verbal   Consent given by:  Patient   Risks discussed:  Discoloration, numbness, pain and swelling   Alternatives discussed:  No treatment Pre-procedure details:    Sensation:  Normal Procedure details:    Laterality:  Left   Location:  Ankle   Ankle:  L ankle   Strapping: no     Splint type: sugartong and posterior long.   Supplies:  Ortho-Glass Post-procedure details:    Pain:  Unchanged   Sensation:  Normal (cap refill continues to be <2sec, nl sensation to light touch, nl dp pulse.)   Patient tolerance of procedure:  Tolerated well, no immediate complications    Critical Care performed: No ____________________________________________   INITIAL IMPRESSION / ASSESSMENT AND PLAN / ED COURSE  Pertinent labs & imaging results that were available during my care of the patient were reviewed by me and considered in my medical decision making (see chart for details).  77 y.o. female status post mechanical  fall with obvious deformity to the left ankle, neurovascularly intact.  Overall, the patient's clinical history and examination is most consistent with a left ankle fracture and possible dislocation.  We will get x-rays for further evaluation.  We will also x-ray her right hand which has significant bruising, as well as her left knee which has an overlying abrasion.  There is no evidence for hip fracture clinically on my examination.  The patient does have an abnormal pulmonary examination, which may be consistent with her baseline COPD and/or resolving pneumonia, will get a chest x-ray for further evaluation.  Basic laboratory studies have been ordered.  The patient will receive  intravenous fluids as well as symptomatic treatment.  Plan reevaluation for final disposition.  ----------------------------------------- 12:26 PM on 02/02/2018 -----------------------------------------  The patient has distal tibia and fibular fractures, comminuted, and proximal fibular fracture, on the left.  Her hand x-ray does not show any fracture.  She states her pain is better but she continues to have some pain so we will give her Dilaudid for further pain management.  The patient is on Coumadin and INR has been sent. She denies any hx of afib, DVT or PE, but states she is on Coumadin "for when I had lung cancer."  Her laboratory studies are pending at this time.  The patient will be admitted for their evaluation and treatment.   Spoke with Dr. Marton Redwood, the orthopedist on-call, who will evaluate the patient.  At this time we will place a posterior and sugar tong splint on the patient and await laboratory studies.  ----------------------------------------- 1:15 PM on 02/02/2018 -----------------------------------------  The patient's splint has been administered and she has tolerated that procedure well.  ----------------------------------------- 1:46 PM on 02/02/2018 -----------------------------------------  The  patient's laboratory studies are reassuring.  She does have an INR of 2.16.  It does appear from prior notes that the patient does have a history of A. fib, which is likely why she is on the Coumadin.  Today, the patient is in sinus rhythm. ____________________________________________  FINAL CLINICAL IMPRESSION(S) / ED DIAGNOSES  Final diagnoses:  Displaced comminuted fracture of shaft of left fibula, initial encounter for closed fracture  Displaced comminuted fracture of shaft of left tibia, initial encounter for closed fracture  Fall, initial encounter  Contusion of right hand, initial encounter  Abrasion, left knee, initial encounter  Closed fracture of proximal end of left fibula, unspecified fracture morphology, initial encounter         NEW MEDICATIONS STARTED DURING THIS VISIT:  New Prescriptions   No medications on file      Eula Listen, MD 02/02/18 1231    Eula Listen, MD 02/02/18 1315    Eula Listen, MD 02/02/18 1346    Eula Listen, MD 02/02/18 1353

## 2018-02-02 NOTE — Consult Note (Signed)
Medical Consultation  Tracy Dickerson SWN:[id number] DOB: [birth year] DOA: 02/02/2018 PCP: Boykin Nearing, MD   Requesting physician: dr Roland Rack Date of consultation: 02/02/2018 Reason for consultation: medical eval  Impression/Recommendations  77 year old female with PAF on Coumadin, lung, breast and skin cancer in remission and diabetes who presents to the emergency room after mechanical fall.  1.  Preoperative evaluation: Patient is at moderate risk for moderate risk procedure.  Patient may proceed to the OR without further cardiac work-up.  2.  PAF: Pharmacy consultation for Coumadin therapy Continue metoprolol for heart rate control  3.  Diabetes: Recommend sliding scale insulin and diabetes nurse consultation  4.  Essential hypertension: Continue lisinopril, Maxide, metoprolol  5.  Hyperlipidemia: Continue statin  6.  History of Barrett's esophagus     Chief Complaint:  Ankle pain  HPI:  77 year old female with a history of breast, skin, lung cancer in remission, PAF on anticoagulation who presents after mechanical fall.  Patient reports that she was at home putting some groceries away when she slipped on the wet floor.  She was having difficulty getting up and her ankle was in severe pain so her husband called EMS. Ankle x-ray shows distal tibial and fibular fracture. Hospitalist was consulted for medical evaluation  Review of Systems  Constitutional: Negative for fever, chills weight loss HENT: Negative for ear pain, nosebleeds, congestion, facial swelling, rhinorrhea, neck pain, neck stiffness and ear discharge.   Respiratory: Negative for cough, shortness of breath, wheezing  Cardiovascular: Negative for chest pain, palpitations and leg swelling.  Gastrointestinal: Negative for heartburn, abdominal pain, vomiting, diarrhea or consitpation Genitourinary: Negative for dysuria, urgency, frequency, hematuria Musculoskeletal: Negative for back pain positive ankle  pain Neurological: Negative for dizziness, seizures, syncope, focal weakness,  numbness and headaches.  Hematological: Does not bruise/bleed easily.  Psychiatric/Behavioral: Negative for hallucinations, confusion, dysphoric mood   Past Medical History:  Diagnosis Date  . Cancer ([place])    Breast and lung  . Diabetes mellitus without complication ([place])   . GERD (gastroesophageal reflux disease)   . Heart disease   . Hypertension    Past Surgical History:  Procedure Laterality Date  . ABDOMINAL HYSTERECTOMY    . APPENDECTOMY    . BREAST SURGERY     breast cancer  . TONSILLECTOMY     Social History:  reports that she has quit smoking. She has never used smokeless tobacco. She reports that she drinks alcohol. She reports that she does not use drugs.  Allergies  Allergen Reactions  . Percocet [Oxycodone-Acetaminophen]   . Penicillins Rash    Has patient had a PCN reaction causing immediate rash, facial/tongue/throat swelling, SOB or lightheadedness with hypotension: Yes Has patient had a PCN reaction causing severe rash involving mucus membranes or skin necrosis: Unknown Has patient had a PCN reaction that required hospitalization: Unknown Has patient had a PCN reaction occurring within the last 10 years: No If all of the above answers are "NO", then may proceed with Cephalosporin use.   Family History  Problem Relation Age of Onset  . Cancer Mother     Prior to Admission medications   Medication Sig Start Date End Date Taking? Authorizing Provider  ADVAIR DISKUS 250-50 MCG/DOSE AEPB Inhale 1 puff into the lungs 2 (two) times daily. 12/14/16  Yes [provider]  atorvastatin (LIPITOR) 80 MG tablet Take 80 mg by mouth daily.   Yes [provider]  diclofenac sodium (VOLTAREN) 1 % GEL Apply 2 g topically QID. 01/29/18  Yes [provider]  Ferrous Sulfate (SLOW FE) 142 (45 Fe) MG TBCR Take 1 tablet by mouth daily.   Yes [provider]   gabapentin (NEURONTIN) 300 MG capsule Take 1 capsule (300 mg total) by mouth at bedtime. 11/24/17  Yes Nance Pear, MD  lisinopril (PRINIVIL,ZESTRIL) 20 MG tablet Take 1 tablet by mouth daily. 11/27/17  Yes [provider]  metFORMIN (GLUCOPHAGE) 500 MG tablet Take 1 tablet (500 mg total) by mouth 2 (two) times daily. 02/06/15 02/02/18 Yes Paduchowski, Lennette Bihari, MD  omeprazole (PRILOSEC) 40 MG capsule Take 40 mg by mouth daily. 01/15/17  Yes [provider]  pramipexole (MIRAPEX) 0.25 MG tablet Take 0.25 mg by mouth at bedtime. 12/23/16  Yes [provider]  SPIRIVA HANDIHALER 18 MCG inhalation capsule Place 1 capsule into inhaler and inhale daily. 01/26/18  Yes [provider]  triamterene-hydrochlorothiazide (MAXZIDE-25) 37.5-25 MG tablet Take 1 tablet by mouth daily.   Yes [provider]  warfarin (COUMADIN) 2.5 MG tablet Take 3.75-5 mg by mouth daily. Take 3.75MG  ON ThursdayS. 5MG  ON Monday,TUESDAY,WEDNESDAY,FRIDAY,SATURDAY,AND SUNDAY 01/21/17  Yes [provider]  albuterol (PROAIR HFA) 108 (90 Base) MCG/ACT inhaler Inhale 2 puffs into the lungs every 4 (four) hours as needed. 04/08/17   [provider]  benzonatate (TESSALON PERLES) 100 MG capsule Take 1 capsule (100 mg total) by mouth every 6 (six) hours as needed for cough. 01/31/18 01/31/19  Harvest Dark, MD  diazepam (VALIUM) 5 MG tablet Take 1 tablet (5 mg total) by mouth every 8 (eight) hours as needed for muscle spasms. Patient not taking: Reported on 01/26/2017 06/08/15   Carrie Mew, MD  diphenhydrAMINE (BENADRYL) 25 mg capsule Take 2 capsules (50 mg total) by mouth every 6 (six) hours as needed. Patient not taking: Reported on 02/02/2018 06/08/15   Carrie Mew, MD  ferrous sulfate (EQL SLOW RELEASE IRON) 160 (50 Fe) MG TBCR SR tablet Take 1 tablet (160 mg total) by mouth daily. Patient not taking: Reported on 02/02/2018 04/18/17   Earleen Newport, MD  metoCLOPramide  (REGLAN) 10 MG tablet Take 1 tablet (10 mg total) by mouth 4 (four) times daily -  before meals and at bedtime. Patient not taking: Reported on 01/26/2017 06/08/15   Carrie Mew, MD  metoprolol tartrate (LOPRESSOR) 25 MG tablet Take 1 tablet (25 mg total) by mouth 2 (two) times daily. Take IF BP >130 (top number) Patient not taking: Reported on 10/29/2017 01/27/17   Bettey Costa, MD  naproxen (NAPROSYN) 500 MG tablet Take 1 tablet (500 mg total) by mouth 2 (two) times daily with a meal. Patient not taking: Reported on 01/26/2017 06/08/15   Carrie Mew, MD  traMADol (ULTRAM) 50 MG tablet Take 1 tablet (50 mg total) by mouth every 6 (six) hours as needed. 04/18/17 04/18/18  Merlyn Lot, MD    Physical Exam: Blood pressure (!) 123/52, pulse 98, temperature 97.6 F (36.4 C), temperature source Oral, resp. rate 17, height 5\' 6"  (1.676 m), weight 111.6 kg (246 lb), SpO2 94 %. @VITALS2 @ [employer]   02/02/18 1124  Weight: 111.6 kg (246 lb)   No intake or output data in the 24 hours ending 02/02/18 1421   Constitutional: Appears well-developed and well-nourished. No distress. HENT: Normocephalic. Marland Kitchen Oropharynx is clear and moist.  Eyes: Conjunctivae and EOM are normal. PERRLA, no scleral icterus.  Neck: Normal ROM. Neck supple. No JVD. No tracheal deviation. CVS: RRR, S1/S2 +, no murmurs, no gallops, no carotid bruit.  Pulmonary:  Effort and breath sounds normal, no stridor, rhonchi, wheezes, rales.  Abdominal: Soft. BS +,  no distension, tenderness, rebound or guarding.  Musculoskeletal: Left ankle is wrapped  neuro: Alert. CN 2-12 grossly intact. No focal deficits. Skin: Skin is warm and dry. No rash noted. Psychiatric: Normal mood and affect.    Labs  Basic Metabolic Panel: Recent Labs  Lab 02/02/18 1256  NA 138  K 3.5  CL 104  CO2 26  GLUCOSE 182*  BUN 15  CREATININE 0.68  CALCIUM 8.6*   Liver Function Tests: No results for input(s): AST, ALT, ALKPHOS, BILITOT,  PROT, ALBUMIN in the last 168 hours. No results for input(s): LIPASE, AMYLASE in the last 168 hours.  CBC: Recent Labs  Lab 02/02/18 1256  WBC 8.4  HGB 10.8*  HCT 33.1*  MCV 80.0  PLT 277   Cardiac Enzymes: Recent Labs  Lab 01/31/18 0936  TROPONINI <0.03   BNP: Invalid input(s): POCBNP CBG: No results for input(s): GLUCAP in the last 168 hours.  Radiological Exams: Dg Chest 1 View  Result Date: 02/02/2018 CLINICAL DATA:  Recent fall EXAM: CHEST  1 VIEW COMPARISON:  01/31/2018 FINDINGS: Cardiac shadow is within normal limits. Lungs are well aerated with some mild volume loss on the right secondary to prior surgery. No focal infiltrate or sizable effusion is noted. No bony abnormality is seen. IMPRESSION: Postsurgical changes with volume loss on the right. No acute abnormality noted. Electronically Signed   By: Inez Catalina M.D.   On: 02/02/2018 12:21   Dg Knee 2 Views Left  Result Date: 02/02/2018 CLINICAL DATA:  Recent fall with left knee pain, initial encounter EXAM: LEFT KNEE - 2 VIEW COMPARISON:  None. FINDINGS: Minimally displaced fracture of the left fibular neck is noted. No soft tissue abnormality is seen. No other fracture is noted. IMPRESSION: Proximal fibular fracture with minimal displacement. Electronically Signed   By: Inez Catalina M.D.   On: 02/02/2018 12:24   Dg Ankle Complete Left  Result Date: 02/02/2018 CLINICAL DATA:  Recent fall with left ankle pain, initial encounter EXAM: LEFT ANKLE COMPLETE - 3+ VIEW COMPARISON:  None. FINDINGS: Comminuted fracture of the distal tibial metaphysis is seen. Additionally oblique fracture through the distal fibula is noted. Mild posterior angulation of the distal tibial fracture fragments is noted. Calcaneal spurring is seen. IMPRESSION: Distal tibial and fibular fractures as described. Electronically Signed   By: Inez Catalina M.D.   On: 02/02/2018 12:21   Dg Hand 2 View Right  Result Date: 02/02/2018 CLINICAL DATA:  Recent  fall with right hand pain, initial encounter EXAM: RIGHT HAND - 2 VIEW COMPARISON:  10/31/2016 FINDINGS: No acute fracture or dislocation is noted. Mild degenerative changes in the first [facility] joint are noted. No soft tissue abnormality is seen. IMPRESSION: No acute abnormality noted. Electronically Signed   By: Inez Catalina M.D.   On: 02/02/2018 12:22    EKG: Sinus rhythm no ST elevation or depression there are some atrial premature complexes   Thank you for allowing me to participate in the care of your patient. We will continue to follow.   Note: This dictation was prepared with Dragon dictation along with smaller phrase technology. Any transcriptional errors that result from this process are unintentional.  Time spent: 40 minutes  Ukiah Trawick, MD

## 2018-02-02 NOTE — ED Notes (Signed)
Patient transported to 134

## 2018-02-02 NOTE — ED Triage Notes (Signed)
Pt to ED after a mechanical fall at home. Deformity noted to left ankle. Pedal pulses intact and color is appropriate. Splint in place upon arrival by EMS. Pt denies having felt faint or dizzy. Pt uses a cane and walker when needed but denies having used either when the fall occurred. Pt is alert and oriented x 4 upon arrival with no trauma to head or neck. '   Pt was seen and treated last week for pneumonia.

## 2018-02-03 ENCOUNTER — Encounter: Payer: Self-pay | Admitting: Anesthesiology

## 2018-02-03 ENCOUNTER — Encounter
Admission: EM | Disposition: A | Discharge status: Skilled Nursing Facility | Payer: Self-pay | Source: Home / Self Care | Attending: Emergency Medicine

## 2018-02-03 ENCOUNTER — Inpatient Hospital Stay: Payer: Medicare Other | Admitting: Anesthesiology

## 2018-02-03 ENCOUNTER — Inpatient Hospital Stay: Payer: Medicare Other

## 2018-02-03 DIAGNOSIS — S82392A Other fracture of lower end of left tibia, initial encounter for closed fracture: Secondary | ICD-10-CM | POA: Diagnosis not present

## 2018-02-03 HISTORY — PX: OPEN REDUCTION INTERNAL FIXATION (ORIF) TIBIA/FIBULA FRACTURE: SHX5992

## 2018-02-03 LAB — GLUCOSE, CAPILLARY
Glucose-Capillary: 143 mg/dL — ABNORMAL HIGH (ref 65–99)
Glucose-Capillary: 124 mg/dL — ABNORMAL HIGH (ref 65–99)
Glucose-Capillary: 127 mg/dL — ABNORMAL HIGH (ref 65–99)
Glucose-Capillary: 150 mg/dL — ABNORMAL HIGH (ref 65–99)
Glucose-Capillary: 127 mg/dL — ABNORMAL HIGH (ref 65–99)

## 2018-02-03 LAB — PROTIME-INR
INR: 1.86
INR: 1.49
Prothrombin Time: 21.3 seconds — ABNORMAL HIGH (ref 11.4–15.2)
Prothrombin Time: 17.9 seconds — ABNORMAL HIGH (ref 11.4–15.2)

## 2018-02-03 LAB — TYPE AND SCREEN
ABO/RH(D): O POS
Antibody Screen: NEGATIVE

## 2018-02-03 LAB — SURGICAL PCR SCREEN
MRSA, PCR: NEGATIVE
Staphylococcus aureus: NEGATIVE

## 2018-02-03 SURGERY — OPEN REDUCTION INTERNAL FIXATION (ORIF) TIBIA/FIBULA FRACTURE
Anesthesia: General | Site: Ankle | Laterality: Left | Wound class: "Clean "

## 2018-02-03 MED ORDER — ATORVASTATIN CALCIUM 20 MG PO TABS
80.0000 mg | ORAL_TABLET | Freq: Every day | ORAL | Status: DC
  Administered 2018-02-04 – 2018-02-05 (×2): 80 mg via ORAL
  Filled 2018-02-03 (×2): qty 4

## 2018-02-03 MED ORDER — TIOTROPIUM BROMIDE MONOHYDRATE 18 MCG IN CAPS
1.0000 | ORAL_CAPSULE | Freq: Every day | RESPIRATORY_TRACT | Status: DC
  Administered 2018-02-03 – 2018-02-05 (×3): 18 ug via RESPIRATORY_TRACT
  Filled 2018-02-03: qty 5

## 2018-02-03 MED ORDER — LIDOCAINE HCL (CARDIAC) PF 100 MG/5ML IV SOSY
PREFILLED_SYRINGE | INTRAVENOUS | Status: DC | PRN
  Administered 2018-02-03: 100 mg via INTRAVENOUS

## 2018-02-03 MED ORDER — BUPIVACAINE HCL 0.5 % IJ SOLN
INTRAMUSCULAR | Status: DC | PRN
  Administered 2018-02-03: 20 mL

## 2018-02-03 MED ORDER — METOCLOPRAMIDE HCL 10 MG PO TABS: 5.0000 mg | ORAL_TABLET | Freq: Three times a day (TID) | ORAL | Status: DC | PRN

## 2018-02-03 MED ORDER — ONDANSETRON HCL 4 MG/2ML IJ SOLN
4.0000 mg | Freq: Four times a day (QID) | INTRAMUSCULAR | Status: DC | PRN
  Filled 2018-02-03: qty 2

## 2018-02-03 MED ORDER — BISACODYL 10 MG RE SUPP: 10.0000 mg | Freq: Every day | RECTAL | Status: DC | PRN

## 2018-02-03 MED ORDER — SODIUM CHLORIDE 0.9 % IV SOLN
INTRAVENOUS | Status: DC
  Administered 2018-02-03: 21:00:00 via INTRAVENOUS

## 2018-02-03 MED ORDER — ACETAMINOPHEN 10 MG/ML IV SOLN
INTRAVENOUS | Status: DC | PRN
  Administered 2018-02-03: 1000 mg via INTRAVENOUS

## 2018-02-03 MED ORDER — FENTANYL CITRATE (PF) 100 MCG/2ML IJ SOLN
INTRAMUSCULAR | Status: DC | PRN
  Administered 2018-02-03 (×4): 25 ug via INTRAVENOUS

## 2018-02-03 MED ORDER — SEVOFLURANE IN SOLN
RESPIRATORY_TRACT | Status: AC
  Filled 2018-02-03: qty 250

## 2018-02-03 MED ORDER — ACETAMINOPHEN 10 MG/ML IV SOLN
INTRAVENOUS | Status: AC
  Filled 2018-02-03: qty 100

## 2018-02-03 MED ORDER — MOMETASONE FURO-FORMOTEROL FUM 200-5 MCG/ACT IN AERO
2.0000 | INHALATION_SPRAY | Freq: Two times a day (BID) | RESPIRATORY_TRACT | Status: DC
  Administered 2018-02-03 – 2018-02-05 (×4): 2 via RESPIRATORY_TRACT
  Filled 2018-02-03: qty 8.8

## 2018-02-03 MED ORDER — GABAPENTIN 300 MG PO CAPS
300.0000 mg | ORAL_CAPSULE | Freq: Every day | ORAL | Status: DC
  Administered 2018-02-03 – 2018-02-04 (×2): 300 mg via ORAL
  Filled 2018-02-03 (×2): qty 1

## 2018-02-03 MED ORDER — LISINOPRIL 20 MG PO TABS: 20.0000 mg | ORAL_TABLET | Freq: Every day | ORAL | Status: DC

## 2018-02-03 MED ORDER — ONDANSETRON HCL 4 MG/2ML IJ SOLN: 4.0000 mg | Freq: Once | INTRAMUSCULAR | Status: DC | PRN

## 2018-02-03 MED ORDER — ACETAMINOPHEN 325 MG PO TABS: 325.0000 mg | ORAL_TABLET | Freq: Four times a day (QID) | ORAL | Status: DC | PRN

## 2018-02-03 MED ORDER — HYDROMORPHONE HCL 1 MG/ML IJ SOLN: 0.5000 mg | INTRAMUSCULAR | Status: DC | PRN

## 2018-02-03 MED ORDER — SODIUM CHLORIDE 0.9 % IV SOLN
INTRAVENOUS | Status: DC | PRN
  Administered 2018-02-03: 18:00:00 via INTRAVENOUS

## 2018-02-03 MED ORDER — PRAMIPEXOLE DIHYDROCHLORIDE 0.25 MG PO TABS
0.2500 mg | ORAL_TABLET | Freq: Every day | ORAL | Status: DC
  Administered 2018-02-03 – 2018-02-04 (×2): 0.25 mg via ORAL
  Filled 2018-02-03 (×2): qty 1

## 2018-02-03 MED ORDER — FENTANYL CITRATE (PF) 100 MCG/2ML IJ SOLN
INTRAMUSCULAR | Status: AC
  Filled 2018-02-03: qty 2

## 2018-02-03 MED ORDER — PROPOFOL 10 MG/ML IV BOLUS
INTRAVENOUS | Status: AC
  Filled 2018-02-03: qty 20

## 2018-02-03 MED ORDER — METOPROLOL TARTRATE 25 MG PO TABS
25.0000 mg | ORAL_TABLET | Freq: Two times a day (BID) | ORAL | Status: DC
  Administered 2018-02-03: 25 mg via ORAL
  Filled 2018-02-03: qty 1

## 2018-02-03 MED ORDER — ONDANSETRON HCL 4 MG PO TABS
4.0000 mg | ORAL_TABLET | Freq: Four times a day (QID) | ORAL | Status: DC | PRN
  Administered 2018-02-03: 4 mg via ORAL
  Filled 2018-02-03: qty 1

## 2018-02-03 MED ORDER — DOCUSATE SODIUM 100 MG PO CAPS
100.0000 mg | ORAL_CAPSULE | Freq: Two times a day (BID) | ORAL | Status: DC
  Administered 2018-02-03 – 2018-02-05 (×4): 100 mg via ORAL
  Filled 2018-02-03 (×4): qty 1

## 2018-02-03 MED ORDER — FERROUS SULFATE 325 (65 FE) MG PO TABS
325.0000 mg | ORAL_TABLET | Freq: Every day | ORAL | Status: DC
  Administered 2018-02-04 – 2018-02-05 (×2): 325 mg via ORAL
  Filled 2018-02-03 (×2): qty 1

## 2018-02-03 MED ORDER — KETOROLAC TROMETHAMINE 15 MG/ML IJ SOLN
15.0000 mg | Freq: Once | INTRAMUSCULAR | Status: AC
  Administered 2018-02-03: 15 mg via INTRAVENOUS

## 2018-02-03 MED ORDER — ENOXAPARIN SODIUM 40 MG/0.4ML ~~LOC~~ SOLN
40.0000 mg | SUBCUTANEOUS | Status: DC
  Administered 2018-02-04 – 2018-02-05 (×2): 40 mg via SUBCUTANEOUS
  Filled 2018-02-03 (×2): qty 0.4

## 2018-02-03 MED ORDER — NEOMYCIN-POLYMYXIN B GU 40-200000 IR SOLN
Status: DC | PRN
  Administered 2018-02-03: 4 mL

## 2018-02-03 MED ORDER — PROPOFOL 10 MG/ML IV BOLUS
INTRAVENOUS | Status: DC | PRN
  Administered 2018-02-03: 120 mg via INTRAVENOUS

## 2018-02-03 MED ORDER — TRAMADOL HCL 50 MG PO TABS
50.0000 mg | ORAL_TABLET | Freq: Four times a day (QID) | ORAL | Status: DC | PRN
  Filled 2018-02-03: qty 1

## 2018-02-03 MED ORDER — FENTANYL CITRATE (PF) 100 MCG/2ML IJ SOLN
INTRAMUSCULAR | Status: AC
  Administered 2018-02-03: 25 ug via INTRAVENOUS
  Filled 2018-02-03: qty 2

## 2018-02-03 MED ORDER — ONDANSETRON HCL 4 MG/2ML IJ SOLN
4.0000 mg | Freq: Four times a day (QID) | INTRAMUSCULAR | Status: DC | PRN
  Administered 2018-02-03: 4 mg via INTRAVENOUS
  Filled 2018-02-03: qty 2

## 2018-02-03 MED ORDER — DIPHENHYDRAMINE HCL 12.5 MG/5ML PO ELIX: 12.5000 mg | ORAL_SOLUTION | ORAL | Status: DC | PRN

## 2018-02-03 MED ORDER — KETOROLAC TROMETHAMINE 15 MG/ML IJ SOLN
INTRAMUSCULAR | Status: AC
  Administered 2018-02-03: 15 mg via INTRAVENOUS
  Filled 2018-02-03: qty 1

## 2018-02-03 MED ORDER — ACETAMINOPHEN 500 MG PO TABS
1000.0000 mg | ORAL_TABLET | Freq: Four times a day (QID) | ORAL | Status: AC
  Administered 2018-02-03 – 2018-02-04 (×4): 1000 mg via ORAL
  Filled 2018-02-03 (×4): qty 2

## 2018-02-03 MED ORDER — KETOROLAC TROMETHAMINE 15 MG/ML IJ SOLN
7.5000 mg | Freq: Four times a day (QID) | INTRAMUSCULAR | Status: AC
  Administered 2018-02-03 – 2018-02-04 (×4): 7.5 mg via INTRAVENOUS
  Filled 2018-02-03 (×4): qty 1

## 2018-02-03 MED ORDER — FENTANYL CITRATE (PF) 100 MCG/2ML IJ SOLN
25.0000 ug | INTRAMUSCULAR | Status: DC | PRN
  Administered 2018-02-03 (×4): 25 ug via INTRAVENOUS

## 2018-02-03 MED ORDER — TRIAMTERENE-HCTZ 37.5-25 MG PO TABS
1.0000 | ORAL_TABLET | Freq: Every day | ORAL | Status: DC
  Filled 2018-02-03 (×3): qty 1

## 2018-02-03 MED ORDER — MAGNESIUM HYDROXIDE 400 MG/5ML PO SUSP: 30.0000 mL | Freq: Every day | ORAL | Status: DC | PRN

## 2018-02-03 MED ORDER — METOCLOPRAMIDE HCL 5 MG/ML IJ SOLN: 5.0000 mg | Freq: Three times a day (TID) | INTRAMUSCULAR | Status: DC | PRN

## 2018-02-03 MED ORDER — METFORMIN HCL 500 MG PO TABS
500.0000 mg | ORAL_TABLET | Freq: Two times a day (BID) | ORAL | Status: DC
  Filled 2018-02-03: qty 1

## 2018-02-03 MED ORDER — PANTOPRAZOLE SODIUM 40 MG PO TBEC
40.0000 mg | DELAYED_RELEASE_TABLET | Freq: Every day | ORAL | Status: DC
  Administered 2018-02-04 – 2018-02-05 (×2): 40 mg via ORAL
  Filled 2018-02-03 (×2): qty 1

## 2018-02-03 MED ORDER — OXYCODONE HCL 5 MG PO TABS
5.0000 mg | ORAL_TABLET | ORAL | Status: DC | PRN
  Administered 2018-02-03: 5 mg via ORAL
  Administered 2018-02-04: 10 mg via ORAL
  Filled 2018-02-03: qty 1
  Filled 2018-02-03: qty 2

## 2018-02-03 MED ORDER — ONDANSETRON HCL 4 MG/2ML IJ SOLN
INTRAMUSCULAR | Status: DC | PRN
  Administered 2018-02-03: 4 mg via INTRAVENOUS

## 2018-02-03 MED ORDER — CEFAZOLIN SODIUM-DEXTROSE 2-4 GM/100ML-% IV SOLN
2.0000 g | Freq: Four times a day (QID) | INTRAVENOUS | Status: AC
  Administered 2018-02-03 – 2018-02-04 (×2): 2 g via INTRAVENOUS
  Filled 2018-02-03 (×3): qty 100

## 2018-02-03 MED ORDER — FLEET ENEMA 7-19 GM/118ML RE ENEM: 1.0000 | ENEMA | Freq: Once | RECTAL | Status: DC | PRN

## 2018-02-03 SURGICAL SUPPLY — 49 items
BANDAGE ELASTIC 6 LF NS (GAUZE/BANDAGES/DRESSINGS) ×2 IMPLANT
BIT DRILL 2.5X2.75 QC CALB (BIT) ×1 IMPLANT
BIT DRILL CALIBRATED 2.7 (BIT) ×1 IMPLANT
BLADE SURG SZ10 CARB STEEL (BLADE) ×4 IMPLANT
BNDG COHESIVE 4X5 TAN STRL (GAUZE/BANDAGES/DRESSINGS) ×2 IMPLANT
BNDG ESMARK 4X12 TAN STRL LF (GAUZE/BANDAGES/DRESSINGS) ×2 IMPLANT
BNDG PLASTER FAST 4X5 WHT LF (CAST SUPPLIES) ×4 IMPLANT
BNDG PLASTER FAST 6X5 WHT LF (CAST SUPPLIES) ×4 IMPLANT
CANISTER SUCT 1200ML W/VALVE (MISCELLANEOUS) ×2 IMPLANT
CHLORAPREP W/TINT 26ML (MISCELLANEOUS) ×2 IMPLANT
CUFF TOURN 30 STER DUAL PORT (MISCELLANEOUS) ×1 IMPLANT
DECANTER SPIKE VIAL GLASS SM (MISCELLANEOUS) ×2 IMPLANT
DRAPE C-ARM XRAY 36X54 (DRAPES) ×2 IMPLANT
DRAPE C-ARMOR (DRAPES) ×2 IMPLANT
DRAPE INCISE IOBAN 66X45 STRL (DRAPES) ×2 IMPLANT
DRAPE U-SHAPE 47X51 STRL (DRAPES) ×1 IMPLANT
ELECT REM PT RETURN 9FT ADLT (ELECTROSURGICAL) ×2
ELECTRODE REM PT RTRN 9FT ADLT (ELECTROSURGICAL) ×1 IMPLANT
GAUZE SPONGE 4X4 12PLY STRL (GAUZE/BANDAGES/DRESSINGS) ×2 IMPLANT
GAUZE XEROFORM 4X4 STRL (GAUZE/BANDAGES/DRESSINGS) ×2 IMPLANT
GLOVE BIO SURGEON STRL SZ7.5 (GLOVE) ×4 IMPLANT
GLOVE INDICATOR 8.0 STRL GRN (GLOVE) ×4 IMPLANT
GOWN STRL REUS W/ TWL LRG LVL3 (GOWN DISPOSABLE) ×1 IMPLANT
GOWN STRL REUS W/ TWL XL LVL3 (GOWN DISPOSABLE) ×1 IMPLANT
GOWN STRL REUS W/TWL LRG LVL3 (GOWN DISPOSABLE) ×1
GOWN STRL REUS W/TWL XL LVL3 (GOWN DISPOSABLE) ×1
HANDLE YANKAUER SUCT BULB TIP (MISCELLANEOUS) ×1 IMPLANT
K-WIRE ACE 1.6X6 (WIRE) ×2
KWIRE ACE 1.6X6 (WIRE) IMPLANT
NS IRRIG 1000ML POUR BTL (IV SOLUTION) ×2 IMPLANT
PACK EXTREMITY ARMC (MISCELLANEOUS) ×2 IMPLANT
PAD CAST CTTN 4X4 STRL (SOFTGOODS) ×2 IMPLANT
PADDING CAST COTTON 4X4 STRL (SOFTGOODS) ×2
PLATE 9H 184 RT MED DIST TIB (Plate) ×1 IMPLANT
SCREW CORT FT 32X3.5XNONLOCK (Screw) IMPLANT
SCREW CORTICAL 3.5MM  30MM (Screw) ×2 IMPLANT
SCREW CORTICAL 3.5MM  32MM (Screw) ×1 IMPLANT
SCREW CORTICAL 3.5MM 30MM (Screw) IMPLANT
SCREW LOCK CORT STAR 3.5X34 (Screw) ×2 IMPLANT
SCREW LOCK CORT STAR 3.5X40 (Screw) ×3 IMPLANT
SPONGE LAP 18X18 5 PK (GAUZE/BANDAGES/DRESSINGS) ×2 IMPLANT
STAPLER SKIN PROX 35W (STAPLE) ×2 IMPLANT
STOCKINETTE M/LG 89821 (MISCELLANEOUS) ×2 IMPLANT
SUT PROLENE 4 0 PS 2 18 (SUTURE) ×2 IMPLANT
SUT VIC AB 0 CT1 36 (SUTURE) ×2 IMPLANT
SUT VIC AB 1 CT1 36 (SUTURE) ×2 IMPLANT
SUT VIC AB 2-0 SH 27 (SUTURE) ×1
SUT VIC AB 2-0 SH 27XBRD (SUTURE) IMPLANT
TOWEL OR 17X26 4PK STRL BLUE (TOWEL DISPOSABLE) ×4 IMPLANT

## 2018-02-03 NOTE — Progress Notes (Signed)
Tracy Dickerson at [facility] NAME: Tracy Dickerson    MR#:  [id number]  DATE OF BIRTH:  Feb 22, 1941  SUBJECTIVE:   Going for surgery this afternoon.  Denies chest pain or shortness of breath.  Doing well.  REVIEW OF SYSTEMS:    Review of Systems  Constitutional: Negative for fever, chills weight loss HENT: Negative for ear pain, nosebleeds, congestion, facial swelling, rhinorrhea, neck pain, neck stiffness and ear discharge.   Respiratory: Negative for cough, shortness of breath, wheezing  Cardiovascular: Negative for chest pain, palpitations and leg swelling.  Gastrointestinal: Negative for heartburn, abdominal pain, vomiting, diarrhea or consitpation Genitourinary: Negative for dysuria, urgency, frequency, hematuria Musculoskeletal: Negative for back pain or joint pain Neurological: Negative for dizziness, seizures, syncope, focal weakness,  numbness and headaches.  Hematological: Does not bruise/bleed easily.  Psychiatric/Behavioral: Negative for hallucinations, confusion, dysphoric mood    Tolerating Diet: N.p.o.      DRUG ALLERGIES:   Allergies  Allergen Reactions  . Percocet [Oxycodone-Acetaminophen]   . Penicillins Rash    Has patient had a PCN reaction causing immediate rash, facial/tongue/throat swelling, SOB or lightheadedness with hypotension: Yes Has patient had a PCN reaction causing severe rash involving mucus membranes or skin necrosis: Unknown Has patient had a PCN reaction that required hospitalization: Unknown Has patient had a PCN reaction occurring within the last 10 years: No If all of the above answers are "NO", then may proceed with Cephalosporin use.    VITALS:  Blood pressure (!) 107/57, pulse 93, temperature 97.6 F (36.4 C), temperature source Oral, resp. rate 16, height 5\' 6"  (1.676 m), weight 111.6 kg (246 lb), SpO2 95 %.  PHYSICAL EXAMINATION:  Constitutional: Appears well-developed and well-nourished. No  distress. HENT: Normocephalic. Marland Kitchen Oropharynx is clear and moist.  Eyes: Conjunctivae and EOM are normal. PERRLA, no scleral icterus.  Neck: Normal ROM. Neck supple. No JVD. No tracheal deviation. CVS: RRR, S1/S2 +, no murmurs, no gallops, no carotid bruit.  Pulmonary: Effort and breath sounds normal, no stridor, rhonchi, wheezes, rales.  Abdominal: Soft. BS +,  no distension, tenderness, rebound or guarding.  Musculoskeletal: Ankle is wrapped. No edema and no tenderness.  Neuro: Alert. CN 2-12 grossly intact. No focal deficits. Skin: Skin is warm and dry. No rash noted. Psychiatric: Normal mood and affect.      LABORATORY PANEL:   CBC Recent Labs  Lab 02/02/18 1256  WBC 8.4  HGB 10.8*  HCT 33.1*  PLT 277   ------------------------------------------------------------------------------------------------------------------  Chemistries  Recent Labs  Lab 02/02/18 1256  NA 138  K 3.5  CL 104  CO2 26  GLUCOSE 182*  BUN 15  CREATININE 0.68  CALCIUM 8.6*   ------------------------------------------------------------------------------------------------------------------  Cardiac Enzymes Recent Labs  Lab 01/31/18 0936  TROPONINI <0.03   ------------------------------------------------------------------------------------------------------------------  RADIOLOGY:  Dg Chest 1 View  Result Date: 02/02/2018 CLINICAL DATA:  Recent fall EXAM: CHEST  1 VIEW COMPARISON:  01/31/2018 FINDINGS: Cardiac shadow is within normal limits. Lungs are well aerated with some mild volume loss on the right secondary to prior surgery. No focal infiltrate or sizable effusion is noted. No bony abnormality is seen. IMPRESSION: Postsurgical changes with volume loss on the right. No acute abnormality noted. Electronically Signed   By: Inez Catalina M.D.   On: 02/02/2018 12:21   Dg Knee 2 Views Left  Result Date: 02/02/2018 CLINICAL DATA:  Recent fall with left knee pain, initial encounter EXAM: LEFT  KNEE - 2 VIEW COMPARISON:  None. FINDINGS: Minimally displaced fracture of the left fibular neck is noted. No soft tissue abnormality is seen. No other fracture is noted. IMPRESSION: Proximal fibular fracture with minimal displacement. Electronically Signed   By: Inez Catalina M.D.   On: 02/02/2018 12:24   Dg Ankle Complete Left  Result Date: 02/02/2018 CLINICAL DATA:  Recent fall with left ankle pain, initial encounter EXAM: LEFT ANKLE COMPLETE - 3+ VIEW COMPARISON:  None. FINDINGS: Comminuted fracture of the distal tibial metaphysis is seen. Additionally oblique fracture through the distal fibula is noted. Mild posterior angulation of the distal tibial fracture fragments is noted. Calcaneal spurring is seen. IMPRESSION: Distal tibial and fibular fractures as described. Electronically Signed   By: Inez Catalina M.D.   On: 02/02/2018 12:21   Dg Hand 2 View Right  Result Date: 02/02/2018 CLINICAL DATA:  Recent fall with right hand pain, initial encounter EXAM: RIGHT HAND - 2 VIEW COMPARISON:  10/31/2016 FINDINGS: No acute fracture or dislocation is noted. Mild degenerative changes in the first [facility] joint are noted. No soft tissue abnormality is seen. IMPRESSION: No acute abnormality noted. Electronically Signed   By: Inez Catalina M.D.   On: 02/02/2018 12:22     ASSESSMENT AND PLAN:   77 year old female with PAF on Coumadin, lung, breast and skin cancer in remission and diabetes who presents to the emergency room after mechanical fall.  1.    Ankle fracture: Plan for patient to go to operating room today.    2.  PAF: Pharmacy consultation for Coumadin therapy Continue metoprolol for heart rate control  3.  Diabetes: Recommend sliding scale insulin and diabetes nurse consultation  4.  Essential hypertension: Continue  Maxide, metoprolol  5.  Hyperlipidemia: Continue statin  6.  History of Barrett's esophagus        Management plans discussed with the patient and she is in  agreement.  CODE STATUS: full  TOTAL TIME TAKING CARE OF THIS PATIENT: 28 minutes.     POSSIBLE D/C 1-2 days, DEPENDING ON CLINICAL CONDITION.   Letzy Gullickson M.D on 02/03/2018 at 9:35 AM  Between 7am to 6pm - Pager - [phone] After 6pm go to [email or web address] - password [email or web address] [facility]  Office  [phone]  CC: Primary care physician; Boykin Nearing, MD  Note: This dictation was prepared with Dragon dictation along with smaller phrase technology. Any transcriptional errors that result from this process are unintentional.

## 2018-02-03 NOTE — Anesthesia Procedure Notes (Signed)
Procedure Name: LMA Insertion Date/Time: 02/03/2018 6:33 PM Performed by: Aline Brochure, CRNA Pre-anesthesia Checklist: Patient identified, Emergency Drugs available, Suction available and Patient being monitored Patient Re-evaluated:Patient Re-evaluated prior to induction Oxygen Delivery Method: Circle system utilized Preoxygenation: Pre-oxygenation with 100% oxygen Induction Type: IV induction Ventilation: Mask ventilation without difficulty LMA: LMA inserted LMA Size: 3.5 Number of attempts: 1 Placement Confirmation: positive ETCO2 and breath sounds checked- equal and bilateral Tube secured with: Tape Dental Injury: Teeth and Oropharynx as per pre-operative assessment

## 2018-02-03 NOTE — Clinical Social Work Note (Signed)
Clinical Social Work Assessment  Patient Details  Name: Tracy Dickerson MRN: [id number] Date of Birth: Jun 06, 1941  Date of referral:  02/03/18               Reason for consult:  Facility Placement                Permission sought to share information with:    Permission granted to share information::     Name::        Agency::     Relationship::     Contact Information:     Housing/Transportation Living arrangements for the past 2 months:  Single Family Home Source of Information:  Patient Patient Interpreter Needed:  None Criminal Activity/Legal Involvement Pertinent to Current Situation/Hospitalization:  No - Comment as needed Significant Relationships:  Siblings Lives with:  Siblings Do you feel safe going back to the place where you live?  Yes Need for family participation in patient care:  Yes (Comment)  Care giving concerns:  Patient lives in [place] with her brother Herbie [place].    Social Worker assessment / plan:  Holiday representative (CSW) reviewed chart and noted that she will have surgery today for a fracture. CSW met with patient alone at bedside prior to surgery. Patient was alert and oriented X4 and was laying in the bed. CSW introduced self and explained role of CSW department. Patient reported that she lives in [place] with her brother who provides 24/7 supervision. Per patient she does not have any children and does not have a HPOA. Patient reported that she has a hospital bed, walker and cane at home. CSW explained that after surgery PT will evaluate patient and make a recommendation of home health or SNF. Per patient she prefers to go home. CSW explained that [facility] will have to approve SNF. Patient reported that her brother can provide support after surgery and she prefers to go home with home health. RN case manager aware of above. CSW will continue to follow and assist as needed.    Employment status:  Disabled (Comment on whether or not currently receiving  Disability), Retired Forensic scientist:  Medicaid [place], [employer] PT Recommendations:  Not assessed at this time Information / Referral to community resources:  [place], Other (Comment Required)(SNF vs. [place] )  Patient/Family's Response to care:  Patient prefers to go home.   Patient/Family's Understanding of and Emotional Response to Diagnosis, Current Treatment, and Prognosis:  Patient was very pleasant and thanked CSW for assistance.   Emotional Assessment Appearance:  Appears stated age Attitude/Demeanor/Rapport:    Affect (typically observed):  Accepting, Adaptable, Pleasant Orientation:  Oriented to Self, Oriented to Place, Oriented to  Time, Oriented to Situation Alcohol / Substance use:  Not Applicable Psych involvement (Current and /or in the community):  No (Comment)  Discharge Needs  Concerns to be addressed:  Discharge Planning Concerns Readmission within the last 30 days:  No Current discharge risk:  Dependent with Mobility Barriers to Discharge:  Continued Medical Work up   [employer], Veronia Beets, LCSW 02/03/2018, 12:05 PM

## 2018-02-03 NOTE — Anesthesia Postprocedure Evaluation (Signed)
Anesthesia Post Note  Patient: Tracy Dickerson  Procedure(s) Performed: OPEN REDUCTION INTERNAL FIXATION (ORIF) DISTAL TIBIA FRACTURE (Left Ankle)  Patient location during evaluation: PACU Anesthesia Type: General Level of consciousness: awake and alert Pain management: pain level controlled Vital Signs Assessment: post-procedure vital signs reviewed and stable Respiratory status: spontaneous breathing, nonlabored ventilation, respiratory function stable and patient connected to nasal cannula oxygen Cardiovascular status: blood pressure returned to baseline and stable Postop Assessment: no apparent nausea or vomiting Anesthetic complications: no     Last Vitals:  Vitals:   02/03/18 2127 02/03/18 2226  BP: 120/67 135/60  Pulse: 92 91  Resp: 19 19  Temp: (!) 36.4 C 36.4 C  SpO2: 100% 100%    Last Pain:  Vitals:   02/03/18 2226  TempSrc: Oral  PainSc:                  Martha Clan

## 2018-02-03 NOTE — H&P (Signed)
Paper H&P to be scanned into permanent record. H&P reviewed and patient re-examined. No changes. 

## 2018-02-03 NOTE — Anesthesia Post-op Follow-up Note (Signed)
Anesthesia QCDR form completed.        

## 2018-02-03 NOTE — Progress Notes (Signed)
Pt is scheduled for a dose of Lopressor this AM. BP this AM 107/57 pulse 93. Rept to Dr. Benjie Karvonen. Per her order hold Lopressor for now due to low BP. Will continue to monitor.

## 2018-02-03 NOTE — Transfer of Care (Signed)
Immediate Anesthesia Transfer of Care Note  Patient: Tracy Dickerson  Procedure(s) Performed: OPEN REDUCTION INTERNAL FIXATION (ORIF) DISTAL TIBIA FRACTURE (Left Ankle)  Patient Location: PACU  Anesthesia Type:General  Level of Consciousness: awake and patient cooperative  Airway & Oxygen Therapy: Patient Spontanous Breathing  Post-op Assessment: Report given to RN and Post -op Vital signs reviewed and stable  Post vital signs: Reviewed and stable  Last Vitals:  Vitals Value Taken Time  BP 125/91 02/03/2018  7:51 PM  Temp    Pulse 99 02/03/2018  7:52 PM  Resp 14 02/03/2018  7:52 PM  SpO2 96 % 02/03/2018  7:52 PM  Vitals shown include unvalidated device data.  Last Pain:  Vitals:   02/03/18 1710  TempSrc: Oral  PainSc: 4       Patients Stated Pain Goal: 4 (28/20/81 3887)  Complications: No apparent anesthesia complications

## 2018-02-03 NOTE — Op Note (Signed)
02/03/2018  7:44 PM  Patient:   ANTONIO WOODHAMS  Pre-Op Diagnosis:   Closed displaced left distal tibia and fibular fractures.  Post-Op Diagnosis:   Same  Procedure:   Open reduction and internal fixation of left distal tibia fracture  Surgeon:   Pascal Lux, MD  Assistant:   None  Anesthesia:   General LMA  Findings:   As above.  Complications:   None  Fluids:   500 cc crystalloid  EBL:   10 cc  UOP:   200 cc  TT:   39 minutes at 300 mmHg  Drains:   None  Closure:   Staples  Implants:   Biomet ALPS 9-hole precontoured distal tibial plate  Brief Clinical Note:   The patient is a 77 year old female who sustained the above-noted injury yesterday morning when she slipped on some water on her brother's kitchen floor while unpacking groceries.  She was brought to the emergency room where x-rays demonstrated the above-noted injury, so she was admitted.  The patient has been cleared medically and presents at this time for definitive management of her injury.  Procedure:   The patient was brought into the operating room and lain in the supine position.  After adequate general laryngeal mask anesthesia was obtained, the patient's left foot and lower extremity were prepped with ChloraPrep solution before being draped sterilely.  Preoperative antibiotics were administered.  A timeout was performed to verify the appropriate surgical site before the limb was exsanguinated with an Esmarch and the thigh tourniquet inflated to 300 mmHg.  The fracture alignment was verified fluoroscopically in AP and lateral projections and found to be anatomic.  An approximately 5 to 6 cm incision was made over the medial aspect of the ankle at the level of the medial malleolus.  The incision was carried down through the subcutaneous tissue with care taken to avoid damaging the saphenous vein and associated saphenous nerves.  After determining the appropriate length plate, the 9-hole Biomet ALPS precontoured  distal tibial plate was inserted into the incision and slid along the anteromedial face of the tibia.  Again, the adequacy of fracture alignment and plate position was verified fluoroscopically in AP and lateral projections.  After several minor adjustments of the plate, the plate was secured to the tibia using five locking screws distally and three bicortical screws proximally.  The three proximal bicortical screws were placed through stab incisions.  Again, the adequacy of fracture alignment and hardware position was verified fluoroscopically in AP and lateral projections and found to be anatomic.  The wounds were copiously irrigated with bacitracin saline solution using bulb irrigation.  The distal incision was closed using 2-0 Vicryl interrupted sutures for the subcutaneous tissues before staples were used to close the skin.  The more proximal stab incisions were closed using staples.  A total of 20 cc of 0.5% plain Sensorcaine was injected in and around the incision sites to help with postoperative analgesia before a sterile bulky dressing was applied to all wounds.  The patient was then placed into a posterior splint with a sugar tong supplement, maintaining the ankle in neutral dorsiflexion.  Once the splint had hardened, the patient was awakened, extubated, and returned to the recovery room in satisfactory condition after tolerating the procedure well.

## 2018-02-03 NOTE — Progress Notes (Signed)
Pt transferred to preop holding area via hospital bed accompanied by brother, Quita Skye, without incident. No change in pt from AM assessment upon transfer. Pt's pain controlled on transfer to preop area. Rept given to [place] in preop area. IV cleocin sent with patient to preop area.

## 2018-02-03 NOTE — NC FL2 (Signed)
[place] LEVEL OF CARE SCREENING TOOL     IDENTIFICATION  Patient Name: Tracy Dickerson Birthdate: 27-Jan-1941 Sex: female Admission Date (Current Location): 02/02/2018  [facility] and [place] Number:  Selena Lesser ([id number] [facility]) Facility and Address:  [facility], [address], [place], [place] [address]      Provider Number: [id number]  Attending Physician Name and Address:  Corky Mull, MD  Relative Name and Phone Number:       Current Level of Care: Hospital Recommended Level of Care: [facility] Prior Approval Number:    Date Approved/Denied:   PASRR Number: [id number])  Discharge Plan: SNF    Current Diagnoses: Patient Active Problem List   Diagnosis Date Noted  . Closed extra-articular fracture of distal tibia 02/02/2018  . Pneumonia 10/29/2017  . Hypotension 01/26/2017    Orientation RESPIRATION BLADDER Height & Weight     Self, Time, Situation, Place  Normal Continent Weight: 246 lb (111.6 kg) Height:  5\' 6"  (167.6 cm)  BEHAVIORAL SYMPTOMS/MOOD NEUROLOGICAL BOWEL NUTRITION STATUS      Continent Diet(Diet: NPO for surgery to be advanced. )  AMBULATORY STATUS COMMUNICATION OF NEEDS Skin   Extensive Assist Verbally Normal                       Personal Care Assistance Level of Assistance  Bathing, Feeding, Dressing Bathing Assistance: Limited assistance Feeding assistance: Independent Dressing Assistance: Limited assistance     Functional Limitations Info  Sight, Hearing, Speech Sight Info: Impaired Hearing Info: Adequate Speech Info: Adequate    SPECIAL CARE FACTORS FREQUENCY  PT (By licensed PT), OT (By licensed OT)     PT Frequency: (5) OT Frequency: (5)            Contractures      Additional Factors Info  Code Status, Allergies Code Status Info: (Full Code. ) Allergies Info: (Percocet Oxycodone-acetaminophen, Penicillins)           Current Medications (02/03/2018):  This  is the current hospital active medication list Current Facility-Administered Medications  Medication Dose Route Frequency Provider Last Rate Last Dose  . acetaminophen (TYLENOL) tablet 650 mg  650 mg Oral Q6H PRN Poggi, Marshall Cork, MD       Or  . acetaminophen (TYLENOL) suppository 650 mg  650 mg Rectal Q6H PRN Poggi, Marshall Cork, MD      . atorvastatin (LIPITOR) tablet 80 mg  80 mg Oral Daily Mody, Sital, MD      . bisacodyl (DULCOLAX) suppository 10 mg  10 mg Rectal Daily PRN Poggi, Marshall Cork, MD      . clindamycin (CLEOCIN) IVPB 900 mg  900 mg Intravenous Once Poggi, Marshall Cork, MD      . dextrose 5 % and 0.9 % NaCl with KCl 20 mEq/L infusion   Intravenous Continuous Poggi, Marshall Cork, MD 75 mL/hr at 02/02/18 2324    . docusate sodium (COLACE) capsule 100 mg  100 mg Oral BID Corky Mull, MD   100 mg at 02/02/18 2324  . [START ON 02/04/2018] ferrous sulfate tablet 325 mg  325 mg Oral Q breakfast Mody, Sital, MD      . HYDROmorphone (DILAUDID) injection 0.5-1 mg  0.5-1 mg Intravenous Q2H PRN Poggi, Marshall Cork, MD   1 mg at 02/02/18 1952  . insulin aspart (novoLOG) injection 0-9 Units  0-9 Units Subcutaneous TID WC Bettey Costa, MD   1 Units at 02/03/18 0926  . magnesium  hydroxide (MILK OF MAGNESIA) suspension 30 mL  30 mL Oral Daily PRN Poggi, Marshall Cork, MD      . metoprolol tartrate (LOPRESSOR) tablet 25 mg  25 mg Oral BID Mody, Sital, MD      . mometasone-formoterol (DULERA) 200-5 MCG/ACT inhaler 2 puff  2 puff Inhalation BID Mody, Sital, MD      . oxyCODONE (Oxy IR/ROXICODONE) immediate release tablet 5-10 mg  5-10 mg Oral Q4H PRN Poggi, Marshall Cork, MD   10 mg at 02/03/18 0934  . pantoprazole (PROTONIX) EC tablet 40 mg  40 mg Oral Daily Mody, Sital, MD      . pramipexole (MIRAPEX) tablet 0.25 mg  0.25 mg Oral QHS Mody, Sital, MD      . sodium phosphate (FLEET) 7-19 GM/118ML enema 1 enema  1 enema Rectal Once PRN Poggi, Marshall Cork, MD      . tiotropium (SPIRIVA) inhalation capsule 18 mcg  1 capsule Inhalation Daily Mody,  Sital, MD      . triamterene-hydrochlorothiazide (MAXZIDE-25) 37.5-25 MG per tablet 1 tablet  1 tablet Oral Daily Bettey Costa, MD      . Warfarin - Pharmacist Dosing Inpatient   Does not apply [id number] Bettey Costa, MD         Discharge Medications: Please see discharge summary for a list of discharge medications.  Relevant Imaging Results:  Relevant Lab Results:   Additional Information (SSN: [id number])  Aisia Correira, Veronia Beets, LCSW

## 2018-02-03 NOTE — Progress Notes (Signed)
ANTICOAGULATION CONSULT NOTE -  Pharmacy Consult for Warfarin Indication: atrial fibrillation  Allergies  Allergen Reactions  . Percocet [Oxycodone-Acetaminophen]   . Penicillins Rash    Has patient had a PCN reaction causing immediate rash, facial/tongue/throat swelling, SOB or lightheadedness with hypotension: Yes Has patient had a PCN reaction causing severe rash involving mucus membranes or skin necrosis: Unknown Has patient had a PCN reaction that required hospitalization: Unknown Has patient had a PCN reaction occurring within the last 10 years: No If all of the above answers are "NO", then may proceed with Cephalosporin use.    Patient Measurements: Height: 5\' 6"  (167.6 cm) Weight: 246 lb (111.6 kg) IBW/kg (Calculated) : 59.3 Heparin Dosing Weight:    Vital Signs: Temp: 97.6 F (36.4 C) (05/14 0729) Temp Source: Oral (05/14 0729) BP: 107/57 (05/14 0729) Pulse Rate: 93 (05/14 0729)  Labs: Recent Labs    01/31/18 0936 02/02/18 1256 02/02/18 2252 02/03/18 0628  HGB 9.9* 10.8*  --   --   HCT 29.8* 33.1*  --   --   PLT 299 277  --   --   APTT  --  36  --   --   LABPROT  --  23.9* 21.3* 17.9*  INR  --  2.16 1.86 1.49  CREATININE 0.67 0.68  --   --   TROPONINI <0.03  --   --   --     Estimated Creatinine Clearance: 78.1 mL/min (by C-G formula based on SCr of 0.68 mg/dL).   Medical History: Past Medical History:  Diagnosis Date  . Cancer ([place])    Breast and lung  . Diabetes mellitus without complication ([place])   . GERD (gastroesophageal reflux disease)   . Heart disease   . Hypertension     Medications:  Scheduled:  . docusate sodium  100 mg Oral BID  . insulin aspart  0-9 Units Subcutaneous TID WC  . pantoprazole  40 mg Oral Daily  . Warfarin - Pharmacist Dosing Inpatient   Does not apply q1800   Infusions:  . clindamycin (CLEOCIN) IV    . dextrose 5 % and 0.9 % NaCl with KCl 20 mEq/L 75 mL/hr at 02/02/18 2324    Assessment: 77 yo F with ankle  fracture, on Warfarin PTA for Afib.  Home dose per Anticoagulation visit note from 01/29/18: 3.75 mg on Thursdays and 5 mg on all other days. Hgb 10.8,  Plt 277  5/13  INR  2.16      * Vit K 1 mg IV x 2 doses given d/t planned surgery for ankle fracture 5/14  INR  1.49   Goal of Therapy:  INR 2-3 Monitor platelets by anticoagulation protocol: Yes   Plan:  Will hold Warfarin today as patient for surgery today.  F/u for resumption of Warfarin. F/u INR in am.  Lore Polka A 02/03/2018,8:28 AM

## 2018-02-03 NOTE — Anesthesia Preprocedure Evaluation (Signed)
Anesthesia Evaluation  Patient identified by MRN, date of birth, ID band Patient awake    Reviewed: Allergy & Precautions, H&P , NPO status , Patient's Chart, lab work & pertinent test results, reviewed documented beta blocker date and time   History of Anesthesia Complications Negative for: history of anesthetic complications  Airway Mallampati: IV  TM Distance: >3 FB Neck ROM: full    Dental  (+) Edentulous Upper, Edentulous Lower, Dental Advidsory Given   Pulmonary shortness of breath and with exertion, sleep apnea , pneumonia, resolved, COPD,  COPD inhaler, Recent URI , Residual Cough, former smoker,           Cardiovascular Exercise Tolerance: Good hypertension, (-) angina(-) CAD, (-) Past MI, (-) Cardiac Stents and (-) CABG (-) dysrhythmias (-) Valvular Problems/Murmurs     Neuro/Psych negative neurological ROS  negative psych ROS   GI/Hepatic Neg liver ROS, GERD  ,  Endo/Other  diabetes, Type 2, Oral Hypoglycemic AgentsMorbid obesity  Renal/GU negative Renal ROS  negative genitourinary   Musculoskeletal   Abdominal   Peds  Hematology negative hematology ROS (+)   Anesthesia Other Findings Past Medical History: No date: Cancer ([place])     Comment:  Breast and lung No date: Diabetes mellitus without complication (HCC) No date: GERD (gastroesophageal reflux disease) No date: Heart disease No date: Hypertension   Reproductive/Obstetrics negative OB ROS                             Anesthesia Physical Anesthesia Plan  ASA: III  Anesthesia Plan: General   Post-op Pain Management:    Induction: Intravenous  PONV Risk Score and Plan: 3 and Ondansetron and Dexamethasone  Airway Management Planned: LMA  Additional Equipment:   Intra-op Plan:   Post-operative Plan: Extubation in OR  Informed Consent: I have reviewed the patients History and Physical, chart, labs and discussed  the procedure including the risks, benefits and alternatives for the proposed anesthesia with the patient or authorized representative who has indicated his/her understanding and acceptance.   Dental Advisory Given  Plan Discussed with: Anesthesiologist, CRNA and Surgeon  Anesthesia Plan Comments:         Anesthesia Quick Evaluation

## 2018-02-04 ENCOUNTER — Encounter: Payer: Self-pay | Admitting: Surgery

## 2018-02-04 ENCOUNTER — Inpatient Hospital Stay: Payer: Medicare Other

## 2018-02-04 DIAGNOSIS — S82392A Other fracture of lower end of left tibia, initial encounter for closed fracture: Secondary | ICD-10-CM | POA: Diagnosis not present

## 2018-02-04 LAB — GLUCOSE, CAPILLARY
Glucose-Capillary: 122 mg/dL — ABNORMAL HIGH (ref 65–99)
Glucose-Capillary: 125 mg/dL — ABNORMAL HIGH (ref 65–99)
Glucose-Capillary: 139 mg/dL — ABNORMAL HIGH (ref 65–99)
Glucose-Capillary: 114 mg/dL — ABNORMAL HIGH (ref 65–99)

## 2018-02-04 LAB — CBC WITH DIFFERENTIAL/PLATELET
Basophils Absolute: 0 10*3/uL (ref 0–0.1)
Basophils Relative: 0 %
Eosinophils Absolute: 0.1 10*3/uL (ref 0–0.7)
Eosinophils Relative: 2 %
HCT: 25.5 % — ABNORMAL LOW (ref 35.0–47.0)
Hemoglobin: 8.3 g/dL — ABNORMAL LOW (ref 12.0–16.0)
Lymphocytes Relative: 20 %
Lymphs Abs: 1.5 10*3/uL (ref 1.0–3.6)
MCH: 26 pg (ref 26.0–34.0)
MCHC: 32.6 g/dL (ref 32.0–36.0)
MCV: 79.6 fL — ABNORMAL LOW (ref 80.0–100.0)
Monocytes Absolute: 0.5 10*3/uL (ref 0.2–0.9)
Monocytes Relative: 7 %
Neutro Abs: 5.6 10*3/uL (ref 1.4–6.5)
Neutrophils Relative %: 71 %
Platelets: 243 10*3/uL (ref 150–440)
RBC: 3.21 MIL/uL — ABNORMAL LOW (ref 3.80–5.20)
RDW: 19.1 % — ABNORMAL HIGH (ref 11.5–14.5)
WBC: 7.8 10*3/uL (ref 3.6–11.0)

## 2018-02-04 LAB — BASIC METABOLIC PANEL
Anion gap: 4 — ABNORMAL LOW (ref 5–15)
BUN: 13 mg/dL (ref 6–20)
CO2: 29 mmol/L (ref 22–32)
Calcium: 8.2 mg/dL — ABNORMAL LOW (ref 8.9–10.3)
Chloride: 105 mmol/L (ref 101–111)
Creatinine, Ser: 0.73 mg/dL (ref 0.44–1.00)
GFR calc Af Amer: >60 mL/min (ref >60)
GFR calc non Af Amer: >60 mL/min (ref >60)
Glucose, Bld: 124 mg/dL — ABNORMAL HIGH (ref 65–99)
Potassium: 4.2 mmol/L (ref 3.5–5.1)
Sodium: 138 mmol/L (ref 135–145)

## 2018-02-04 LAB — PROTIME-INR
INR: 1.31
Prothrombin Time: 16.2 seconds — ABNORMAL HIGH (ref 11.4–15.2)

## 2018-02-04 MED ORDER — POLYETHYLENE GLYCOL 3350 17 G PO PACK
17.0000 g | PACK | Freq: Every day | ORAL | Status: DC
  Administered 2018-02-04 – 2018-02-05 (×2): 17 g via ORAL
  Filled 2018-02-04 (×3): qty 1

## 2018-02-04 MED ORDER — WARFARIN SODIUM 7.5 MG PO TABS
7.5000 mg | ORAL_TABLET | Freq: Once | ORAL | Status: AC
  Administered 2018-02-04: 7.5 mg via ORAL
  Filled 2018-02-04: qty 1

## 2018-02-04 MED ORDER — LISINOPRIL 10 MG PO TABS
10.0000 mg | ORAL_TABLET | Freq: Every day | ORAL | Status: DC
  Administered 2018-02-05: 10 mg via ORAL

## 2018-02-04 MED ORDER — METOPROLOL TARTRATE 25 MG PO TABS
12.5000 mg | ORAL_TABLET | Freq: Two times a day (BID) | ORAL | Status: DC
  Administered 2018-02-04 – 2018-02-05 (×2): 12.5 mg via ORAL
  Filled 2018-02-04 (×2): qty 1

## 2018-02-04 MED ORDER — SENNA 8.6 MG PO TABS
1.0000 | ORAL_TABLET | Freq: Every day | ORAL | Status: DC
  Administered 2018-02-04 – 2018-02-05 (×2): 8.6 mg via ORAL
  Filled 2018-02-04 (×2): qty 1

## 2018-02-04 MED ORDER — FLEET ENEMA 7-19 GM/118ML RE ENEM
1.0000 | ENEMA | Freq: Once | RECTAL | Status: AC
  Administered 2018-02-04: 1 via RECTAL

## 2018-02-04 NOTE — Progress Notes (Signed)
Clinical Education officer, museum (CSW) met with patient and her brother at bedside. Patient is now agreeable to SNF and requested [employer]. FL2 complete and has been faxed out. CSW will present bed offers to patient tomorrow.   [employer], LCSW [phone]

## 2018-02-04 NOTE — Progress Notes (Signed)
  Subjective: 1 Day Post-Op Procedure(s) (LRB): OPEN REDUCTION INTERNAL FIXATION (ORIF) DISTAL TIBIA FRACTURE (Left) Patient reports pain as mild.   Patient is well, and has had no acute complaints or problems PT and care management to assist with discharge planning, patient is determined to return home.  Pt already has a hospital bed and 24/7 care. Negative for chest pain and shortness of breath Fever: no Gastrointestinal:Negative for nausea and vomiting  Objective: Vital signs in last 24 hours: Temp:  [97.3 F (36.3 C)-98.4 F (36.9 C)] 98.2 F (36.8 C) (05/15 1150) Pulse Rate:  [73-99] 93 (05/15 1150) Resp:  [11-20] 18 (05/15 1150) BP: (96-140)/(46-91) 105/57 (05/15 1150) SpO2:  [91 %-100 %] 100 % (05/15 1150)  Intake/Output from previous day:  Intake/Output Summary (Last 24 hours) at 02/04/2018 1402 Last data filed at 02/04/2018 0900 Gross per 24 hour  Intake 1770 ml  Output 660 ml  Net 1110 ml    Intake/Output this shift: Total I/O In: 120 [P.O.:120] Out: -   Labs: Recent Labs    02/02/18 1256 02/04/18 0414  HGB 10.8* 8.3*   Recent Labs    02/02/18 1256 02/04/18 0414  WBC 8.4 7.8  RBC 4.14 3.21*  HCT 33.1* 25.5*  PLT 277 243   Recent Labs    02/02/18 1256 02/04/18 0414  NA 138 138  K 3.5 4.2  CL 104 105  CO2 26 29  BUN 15 13  CREATININE 0.68 0.73  GLUCOSE 182* 124*  CALCIUM 8.6* 8.2*   Recent Labs    02/03/18 0628 02/04/18 0414  INR 1.49 1.31     EXAM General - Patient is Alert, Appropriate and Oriented Extremity - ABD soft Sensation intact distally No cellulitis present  Intact to light touch to the dorsal and volar aspect of toes. Dressing/Incision - Splint intact to the left foot. Motor Function - intact, moving toes well on exam.  Negative Homan's to the left leg.  Past Medical History:  Diagnosis Date  . Cancer ([place])    Breast and lung  . Diabetes mellitus without complication ([place])   . GERD (gastroesophageal reflux  disease)   . Heart disease   . Hypertension     Assessment/Plan: 1 Day Post-Op Procedure(s) (LRB): OPEN REDUCTION INTERNAL FIXATION (ORIF) DISTAL TIBIA FRACTURE (Left) Active Problems:   Closed extra-articular fracture of distal tibia  Estimated body mass index is 39.71 kg/m as calculated from the following:   Height as of this encounter: 5\' 6"  (1.676 m).   Weight as of this encounter: 111.6 kg (246 lb). Advance diet Up with therapy   Labs reviewed this AM.  Hg 8.3 this AM. Pt is passing gas without pain. PT to work with patient today. When cleared by PT the patient can be discharged, possible today.  DVT Prophylaxis - Coumadin, Foot Pumps and TED hose Non-weightbearing to the left leg.  Raquel Elizabth Palka, PA-C [facility] 02/04/2018, 2:02 PM

## 2018-02-04 NOTE — Progress Notes (Addendum)
ANTICOAGULATION CONSULT NOTE -  Pharmacy Consult for Warfarin Indication: atrial fibrillation  Allergies  Allergen Reactions  . Percocet [Oxycodone-Acetaminophen]   . Penicillins Rash    Has patient had a PCN reaction causing immediate rash, facial/tongue/throat swelling, SOB or lightheadedness with hypotension: Yes Has patient had a PCN reaction causing severe rash involving mucus membranes or skin necrosis: Unknown Has patient had a PCN reaction that required hospitalization: Unknown Has patient had a PCN reaction occurring within the last 10 years: No If all of the above answers are "NO", then may proceed with Cephalosporin use.    Patient Measurements: Height: 5\' 6"  (167.6 cm) Weight: 246 lb (111.6 kg) IBW/kg (Calculated) : 59.3 Heparin Dosing Weight:    Vital Signs: Temp: 97.6 F (36.4 C) (05/15 0517) Temp Source: Oral (05/15 0517) BP: 99/55 (05/15 0517) Pulse Rate: 74 (05/15 0517)  Labs: Recent Labs    02/02/18 1256 02/02/18 2252 02/03/18 0628 02/04/18 0414  HGB 10.8*  --   --  8.3*  HCT 33.1*  --   --  25.5*  PLT 277  --   --  243  APTT 36  --   --   --   LABPROT 23.9* 21.3* 17.9* 16.2*  INR 2.16 1.86 1.49 1.31  CREATININE 0.68  --   --  0.73    Estimated Creatinine Clearance: 78.1 mL/min (by C-G formula based on SCr of 0.73 mg/dL).   Medical History: Past Medical History:  Diagnosis Date  . Cancer ([place])    Breast and lung  . Diabetes mellitus without complication ([place])   . GERD (gastroesophageal reflux disease)   . Heart disease   . Hypertension     Medications:  Scheduled:  . acetaminophen  1,000 mg Oral Q6H  . atorvastatin  80 mg Oral Daily  . docusate sodium  100 mg Oral BID  . enoxaparin (LOVENOX) injection  40 mg Subcutaneous Q24H  . ferrous sulfate  325 mg Oral Q breakfast  . gabapentin  300 mg Oral QHS  . insulin aspart  0-9 Units Subcutaneous TID WC  . ketorolac  7.5 mg Intravenous Q6H  . lisinopril  20 mg Oral Daily  . metFORMIN   500 mg Oral BID WC  . metoprolol tartrate  25 mg Oral BID  . mometasone-formoterol  2 puff Inhalation BID  . pantoprazole  40 mg Oral Daily  . pramipexole  0.25 mg Oral QHS  . tiotropium  1 capsule Inhalation Daily  . triamterene-hydrochlorothiazide  1 tablet Oral Daily  . Warfarin - Pharmacist Dosing Inpatient   Does not apply q1800   Infusions:  . sodium chloride 75 mL/hr at 02/03/18 2106  .  ceFAZolin (ANCEF) IV Stopped (02/03/18 2146)    Assessment: 77 yo F with ankle fracture, on Warfarin PTA for Afib.  Home dose per Anticoagulation visit note from 01/29/18: 3.75 mg on Thursdays and 5 mg on all other days. Hgb 10.8,  Plt 277  5/13  INR  2.16      * Vit K 1 mg IV x 2 doses given d/t planned surgery for ankle fracture 5/14  INR  1.49   Dose HELD for surgery 5/15  INR  1.31     Goal of Therapy:  INR 2-3 Monitor platelets by anticoagulation protocol: Yes   Plan:  Patient s/p surgery on 02/03/18. MD has order Warfarin 7.5 mg po x 1. F/u INR in am.  Estephanie Hubbs A 02/04/2018,7:17 AM

## 2018-02-04 NOTE — Progress Notes (Signed)
PT is recommending SNF. Clinical Social Worker (CSW) met with patient to discuss D/C plan. Patient was alert and oriented X4 and was sitting up in the chair at bedside. Per patient she does not want to go to SNF and refused SNF placement. Per patient she will D/C home with her brother who can provide 24/7 care. Per patient her brother will transport her home. RN case manager aware of above. Please reconsult if future social work needs arise. CSW signing off.   [employer], LCSW [phone]

## 2018-02-04 NOTE — Care Management Note (Signed)
Case Management Note  Patient Details  Name: Tracy Dickerson MRN: [id number] Date of Birth: [birth year]  Subjective/Objective:  Met with patient with her bother at bedside to discuss discharge planning. Her brother lives with her and is her primary caregiver.She has a hospital bed and a walker. Will need a bsc. Ordered from [place] with [facility]. Offered a choice of home health providers. Referral to [facility] for HHPT. She declined [facility]. Patient is on coumadin.                  Action/Plan: [facility] for HHPT. [facility] for BSC.   Expected Discharge Date:  02/04/18               Expected Discharge Plan:  [place]  In-House Referral:     Discharge planning Services  CM Consult  Post Acute Care Choice:  Durable Medical Equipment, Home Health Choice offered to:  Patient  DME Arranged:  Bedside commode DME Agency:  [place]:  PT [employer] Agency:  [facility]  Status of Service:  Completed, signed off  If discussed at [employer] of Stay Meetings, dates discussed:    Additional Comments:  Jolly Mango, RN 02/04/2018, 2:30 PM

## 2018-02-04 NOTE — Progress Notes (Signed)
Per conversation with MD Benjie Karvonen) may In and Cath x 1 for fluid retention. Bladder scanned prior, revealed 252mls post removal of foley.

## 2018-02-04 NOTE — Progress Notes (Signed)
Physical Therapy Treatment Patient Details Name: Tracy Dickerson MRN: [id number] DOB: [birth year] Today's Date: 02/04/2018    History of Present Illness Patient is a 77 year old female admitted s/p ORIF of L distal tibia fracture following a fall.  PMH includes htn, heart disease, GERD, CA and DM.    PT Comments    Pt on BSC with nursing upon PT arrival.  Pt required +2 Max A to stand from [facility] and attempted a standing pivot transfer with VC's for use of UE to manage WB status.  Pt unable to initiate swing phase with L LE.  PT pulled recliner to pt and assisted pt in sitting.  Pt perform lateral transfer from chair to bed with mod A and heavy VC's for management of WB status.  PT introduced supine LE exercises and pt was able to complete sets of 10 with VC's and min manual cues.  Pt reported lower pain level, 6/10, following treatment.  Pt will continue to benefit from skilled PT with focus on strength, tolerance to activity, functional mobility, balance and pain management.  Follow Up Recommendations  SNF     Equipment Recommendations  None recommended by PT    Recommendations for Other Services       Precautions / Restrictions Precautions Precautions: Fall Restrictions Weight Bearing Restrictions: Yes LLE Weight Bearing: Non weight bearing    Mobility  Bed Mobility Overal bed mobility: Needs Assistance Bed Mobility: Sit to Supine       Sit to supine: Mod assist   General bed mobility comments: Assistance to lower into bed and bring LE's over EOB.  Transfers Overall transfer level: Needs assistance Equipment used: Rolling walker (2 wheeled) Transfers: Stand Pivot Transfers Sit to Stand: Max assist;+2 physical assistance Stand pivot transfers: Max assist;+2 physical assistance      Lateral/Scoot Transfers: Mod assist;+2 physical assistance General transfer comment: Provided assistance in managing WB status, RW and sequencing during transfers.  Pt unable to perform  standing pivot with VC's.  PT pulled chair to pt after pt was unable to initiate movement using RW and R LE.  Pt completed lateral transfer from chair to bed with VC's for avoidance of WB on L LE.  Ambulation/Gait Ambulation/Gait assistance: (Did not perform.)               Stairs             Wheelchair Mobility    Modified Rankin (Stroke Patients Only)       Balance Overall balance assessment: Needs assistance Sitting-balance support: Bilateral upper extremity supported Sitting balance-Leahy Scale: Good     Standing balance support: Bilateral upper extremity supported Standing balance-Leahy Scale: Poor                              Cognition Arousal/Alertness: Awake/alert Behavior During Therapy: WFL for tasks assessed/performed Overall Cognitive Status: Within Functional Limits for tasks assessed                                 General Comments: Able to follow commands.      Exercises General Exercises - Lower Extremity Ankle Circles/Pumps: AROM;20 reps;Seated Quad Sets: Strengthening;Left;10 reps;Supine Hip ABduction/ADduction: AROM;10 reps;Supine;Left Straight Leg Raises: Left;Strengthening;10 reps;Supine    General Comments        Pertinent Vitals/Pain Pain Assessment: 0-10 Pain Score: 6  Pain Location: L LL Pain  Descriptors / Indicators: Aching Pain Intervention(s): Limited activity within patient's tolerance;Monitored during session    [place] expects to be discharged to:: Private residence Living Arrangements: Other relatives(Brother)   Type of Home: House Home Access: Stairs to enter Entrance Stairs-Rails: None Home Layout: One level Home Equipment: Tub bench;Walker - 2 wheels;Walker - 4 wheels;Cane - single point      Prior Function Level of Independence: Independent with assistive device(s)      Comments: Pt independent with ADL's and use of RW or SPC.  Has never driven.  Brother  obtains groceries.   PT Goals (current goals can now be found in the care plan section) Acute Rehab PT Goals Patient Stated Goal: To return home. PT Goal Formulation: With patient Time For Goal Achievement: 02/04/18 Potential to Achieve Goals: Fair Progress towards PT goals: Not progressing toward goals - comment(Pt still demonstrates difficulty in performing transfers.  +2 assist.)    Frequency    BID      PT Plan Current plan remains appropriate    Co-evaluation              AM-PAC PT "6 Clicks" Daily Activity  Outcome Measure  Difficulty turning over in bed (including adjusting bedclothes, sheets and blankets)?: A Little Difficulty moving from lying on back to sitting on the side of the bed? : A Little Difficulty sitting down on and standing up from a chair with arms (e.g., wheelchair, bedside commode, etc,.)?: A Lot Help needed moving to and from a bed to chair (including a wheelchair)?: A Lot Help needed walking in hospital room?: Total Help needed climbing 3-5 steps with a railing? : Total 6 Click Score: 12    End of Session Equipment Utilized During Treatment: Gait belt Activity Tolerance: Patient limited by pain;Patient limited by fatigue Patient left: with call bell/phone within reach;with chair alarm set;in bed;with bed alarm set Nurse Communication: Mobility status PT Visit Diagnosis: Unsteadiness on feet (R26.81);History of falling (Z91.81);Pain Pain - Right/Left: Left Pain - part of body: Leg;Knee     Time: 4982-6415 PT Time Calculation (min) (ACUTE ONLY): 30 min  Charges:  $Therapeutic Exercise: 8-22 mins $Therapeutic Activity: 8-22 mins                    G Codes:  Functional Assessment Tool Used: AM-PAC 6 Clicks Basic Mobility    Roxanne Gates, PT, DPT    Roxanne Gates 02/04/2018, 4:31 PM

## 2018-02-04 NOTE — Clinical Social Work Placement (Signed)
   CLINICAL SOCIAL WORK PLACEMENT  NOTE  Date:  02/04/2018  Patient Details  Name: Tracy Dickerson MRN: [id number] Date of Birth: 12-22-1940  Clinical Social Work is seeking post-discharge placement for this patient at the [place] level of care (*CSW will initial, date and re-position this form in  chart as items are completed):  Yes   Patient/family provided with [place] Work Department's list of facilities offering this level of care within the geographic area requested by the patient (or if unable, by the patient's family).  Yes   Patient/family informed of their freedom to choose among providers that offer the needed level of care, that participate in Medicare, Medicaid or managed care program needed by the patient, have an available bed and are willing to accept the patient.  Yes   Patient/family informed of [place]'s ownership interest in [facility] and [place], as well as of the fact that they are under no obligation to receive care at these facilities.  PASRR submitted to EDS on 02/03/18     PASRR number received on 02/03/18     Existing PASRR number confirmed on       FL2 transmitted to all facilities in geographic area requested by pt/family on 02/04/18     FL2 transmitted to all facilities within larger geographic area on       Patient informed that his/her managed care company has contracts with or will negotiate with certain facilities, including the following:            Patient/family informed of bed offers received.  Patient chooses bed at       Physician recommends and patient chooses bed at      Patient to be transferred to   on  .  Patient to be transferred to facility by       Patient family notified on   of transfer.  Name of family member notified:        PHYSICIAN       Additional Comment:    _______________________________________________ Antonie Borjon, Veronia Beets, LCSW 02/04/2018, 5:14 PM

## 2018-02-04 NOTE — Progress Notes (Addendum)
[facility] [place] NAME: Tracy Dickerson    MR#:  [id number]  DATE OF BIRTH:  [birth year]  SUBJECTIVE:   Feels constipated this morning REVIEW OF SYSTEMS:    Review of Systems  Constitutional: Negative for fever, chills weight loss HENT: Negative for ear pain, nosebleeds, congestion, facial swelling, rhinorrhea, neck pain, neck stiffness and ear discharge.   Respiratory: Negative for cough, positive for mild shortness of breath, no wheezing  Cardiovascular: Negative for chest pain, palpitations and leg swelling.  Gastrointestinal: Negative for heartburn, abdominal pain, vomiting, diarrhea positive consitpation Genitourinary: Negative for dysuria, urgency, frequency, hematuria Musculoskeletal: Negative for back pain or  Positive pain from surgery Neurological: Negative for dizziness, seizures, syncope, focal weakness,  numbness and headaches.  Hematological: Does not bruise/bleed easily.  Psychiatric/Behavioral: Negative for hallucinations, confusion, dysphoric mood    Tolerating Diet:  yes      DRUG ALLERGIES:   Allergies  Allergen Reactions  . Percocet [Oxycodone-Acetaminophen]   . Penicillins Rash    Has patient had a PCN reaction causing immediate rash, facial/tongue/throat swelling, SOB or lightheadedness with hypotension: Yes Has patient had a PCN reaction causing severe rash involving mucus membranes or skin necrosis: Unknown Has patient had a PCN reaction that required hospitalization: Unknown Has patient had a PCN reaction occurring within the last 10 years: No If all of the above answers are "NO", then may proceed with Cephalosporin use.    VITALS:  Blood pressure (!) 112/46, pulse 80, temperature 98.4 F (36.9 C), temperature source Axillary, resp. rate 19, height 5\' 6"  (1.676 m), weight 111.6 kg (246 lb), SpO2 99 %.  PHYSICAL EXAMINATION:  Constitutional: Appears well-developed and well-nourished. No distress. HENT:  Normocephalic. Marland Kitchen Oropharynx is clear and moist.  Eyes: Conjunctivae and EOM are normal. PERRLA, no scleral icterus.  Neck: Normal ROM. Neck supple. No JVD. No tracheal deviation. CVS: RRR, S1/S2 +, no murmurs, no gallops, no carotid bruit.  Pulmonary: Effort and breath sounds normal, no stridor, rhonchi, wheezes, rales.  Abdominal: Soft. BS +, mild distension and tenderness, no rebound or guarding.  Musculoskeletal: Ankle is wrapped. No edema and no tenderness.  Neuro: Alert. CN 2-12 grossly intact. No focal deficits. Skin: Skin is warm and dry. No rash noted. Psychiatric: Normal mood and affect.      LABORATORY PANEL:   CBC Recent Labs  Lab 02/04/18 0414  WBC 7.8  HGB 8.3*  HCT 25.5*  PLT 243   ------------------------------------------------------------------------------------------------------------------  Chemistries  Recent Labs  Lab 02/04/18 0414  NA 138  K 4.2  CL 105  CO2 29  GLUCOSE 124*  BUN 13  CREATININE 0.73  CALCIUM 8.2*   ------------------------------------------------------------------------------------------------------------------  Cardiac Enzymes Recent Labs  Lab 01/31/18 0936  TROPONINI <0.03   ------------------------------------------------------------------------------------------------------------------  RADIOLOGY:  Dg Chest 1 View  Result Date: 02/02/2018 CLINICAL DATA:  Recent fall EXAM: CHEST  1 VIEW COMPARISON:  01/31/2018 FINDINGS: Cardiac shadow is within normal limits. Lungs are well aerated with some mild volume loss on the right secondary to prior surgery. No focal infiltrate or sizable effusion is noted. No bony abnormality is seen. IMPRESSION: Postsurgical changes with volume loss on the right. No acute abnormality noted. Electronically Signed   By: Inez Catalina M.D.   On: 02/02/2018 12:21   Dg Knee 2 Views Left  Result Date: 02/02/2018 CLINICAL DATA:  Recent fall with left knee pain, initial encounter EXAM: LEFT KNEE - 2  VIEW COMPARISON:  None. FINDINGS: Minimally displaced  fracture of the left fibular neck is noted. No soft tissue abnormality is seen. No other fracture is noted. IMPRESSION: Proximal fibular fracture with minimal displacement. Electronically Signed   By: Inez Catalina M.D.   On: 02/02/2018 12:24   Dg Tibia/fibula Left  Result Date: 02/03/2018 CLINICAL DATA:  Tibial fracture EXAM: LEFT TIBIA AND FIBULA - 2 VIEW; DG C-ARM 61-120 MIN COMPARISON:  02/02/2018 FINDINGS: Four low resolution intraoperative spot views of the left tibia and fibula. Total fluoroscopy time was 45 seconds. Surgical plate and screw fixation of the tibia across comminuted distal tibial fracture. Additional distal fibular fracture with anatomic alignment. IMPRESSION: Intraoperative fluoroscopic assistance provided during surgical fixation of tibial fracture Electronically Signed   By: Donavan Foil M.D.   On: 02/03/2018 20:06   Dg Ankle Complete Left  Result Date: 02/02/2018 CLINICAL DATA:  Recent fall with left ankle pain, initial encounter EXAM: LEFT ANKLE COMPLETE - 3+ VIEW COMPARISON:  None. FINDINGS: Comminuted fracture of the distal tibial metaphysis is seen. Additionally oblique fracture through the distal fibula is noted. Mild posterior angulation of the distal tibial fracture fragments is noted. Calcaneal spurring is seen. IMPRESSION: Distal tibial and fibular fractures as described. Electronically Signed   By: Inez Catalina M.D.   On: 02/02/2018 12:21   Dg Hand 2 View Right  Result Date: 02/02/2018 CLINICAL DATA:  Recent fall with right hand pain, initial encounter EXAM: RIGHT HAND - 2 VIEW COMPARISON:  10/31/2016 FINDINGS: No acute fracture or dislocation is noted. Mild degenerative changes in the first [facility] joint are noted. No soft tissue abnormality is seen. IMPRESSION: No acute abnormality noted. Electronically Signed   By: Inez Catalina M.D.   On: 02/02/2018 12:22   Dg C-arm 1-60 Min  Result Date: 02/03/2018 CLINICAL  DATA:  Tibial fracture EXAM: LEFT TIBIA AND FIBULA - 2 VIEW; DG C-ARM 61-120 MIN COMPARISON:  02/02/2018 FINDINGS: Four low resolution intraoperative spot views of the left tibia and fibula. Total fluoroscopy time was 45 seconds. Surgical plate and screw fixation of the tibia across comminuted distal tibial fracture. Additional distal fibular fracture with anatomic alignment. IMPRESSION: Intraoperative fluoroscopic assistance provided during surgical fixation of tibial fracture Electronically Signed   By: Donavan Foil M.D.   On: 02/03/2018 20:06     ASSESSMENT AND PLAN:   77 year old female with PAF on Coumadin, lung, breast and skin cancer in remission and diabetes who presents to the emergency room after mechanical fall.  1.    Ankle fracture:  Patient postoperative day #1 ORIF left distal tibia fracture Management as per orthopedic surgery Monitor CBC.  Hemoglobin with slight drop due to blood loss from surgery   2.  PAF: Pharmacy consultation for Coumadin therapy Continue metoprolol for heart rate control  3.  Diabetes: Continue sliding scale  4.  Essential hypertension: Continue  Maxide, metoprolol, lisinopril I decreased doses of metoprolol and lisinopril this morning due to low/normal blood pressure  5.  Hyperlipidemia: Continue statin  6.  History of Barrett's esophagus 7.  Constipation: Add aggressive bowel regimen Check KUB for constipation  8.  Shortness of breath without hypoxia: Check chest x-ray for atelectasis Order incentive spirometer    Management plans discussed with the patient and she is in agreement.  CODE STATUS: full  TOTAL TIME TAKING CARE OF THIS PATIENT: 28 minutes.     POSSIBLE D/C 1-2 days, DEPENDING ON CLINICAL CONDITION.   Khala Tarte M.D on 02/04/2018 at 9:45 AM  Between 7am to 6pm -  Pager - [phone] After 6pm go to [email or web address] - password [email or web address] [facility]  Office  [phone]  CC: Primary care  physician; Boykin Nearing, MD  Note: This dictation was prepared with Dragon dictation along with smaller phrase technology. Any transcriptional errors that result from this process are unintentional.

## 2018-02-04 NOTE — Evaluation (Signed)
Physical Therapy Evaluation Patient Details Name: Tracy Dickerson MRN: [id number] DOB: Jan 08, 1941 Today's Date: 02/04/2018   History of Present Illness  Patient is a 77 year old female admitted s/p ORIF of L distal tibia fracture following a fall.  PMH includes htn, heart disease, GERD, CA and DM.  Clinical Impression  Pt is a 77 year old female who lives in a one story home with her brother.  She reports being independent with ADL's at baseline.  Pt is in bed upon PT arrival and reports 10/10 pain in her L LL.  Pt able to perform bed mobility mod I and perform good sitting balance.  Pt presents with good overall UE/LE strength and reports good sensation in her R foot but that L LE sensation has not returned yet.  Pt attempted STS but was unable to complete.  PT assisted pt in performing a lateral transfer from bed to chair and pt required several VC's to maintain NWB precautions of L LE.  Pt reported pain increase throughout transfer.  Pt is insistent that she does not want to go to SNF and that her brother can help her ascend 2 steps to get into her home.  PT discussed benefit of SNF.  Pt will benefit from skilled PT with focus on strength, balance, pain management, tolerance to activity and functional mobility.    Follow Up Recommendations SNF    Equipment Recommendations  None recommended by PT    Recommendations for Other Services       Precautions / Restrictions Precautions Precautions: Fall Restrictions Weight Bearing Restrictions: Yes LLE Weight Bearing: Non weight bearing      Mobility  Bed Mobility Overal bed mobility: Modified Independent             General bed mobility comments: Increased time and use of bed rail.  Transfers Overall transfer level: Needs assistance Equipment used: Rolling walker (2 wheeled) Transfers: Sit to/from Stand;Lateral/Scoot Transfers Sit to Stand: Max assist        Lateral/Scoot Transfers: Mod assist General transfer comment: Pt  attempted to stand 2x with Max A, unable to achieve upright posture reporting pain in L and fatigue in R LE.  Pt very unsteady on feet and requires frequent reminders to maintain NWB status of L LE.  Pt able to scoot from bed to chair with chair arm lowered and bed slightly higher than chair.  PT provided assistance stabilizing chair and VC's for foot and hand placement and maintenance of WB status.  Ambulation/Gait Ambulation/Gait assistance: (Did not perform.)              Stairs            Wheelchair Mobility    Modified Rankin (Stroke Patients Only)       Balance Overall balance assessment: History of Falls;Needs assistance Sitting-balance support: Bilateral upper extremity supported;Feet supported Sitting balance-Leahy Scale: Good     Standing balance support: Bilateral upper extremity supported Standing balance-Leahy Scale: Poor                               Pertinent Vitals/Pain Pain Assessment: 0-10 Pain Score: 10-Worst pain ever Pain Location: L LL and knee Pain Intervention(s): Monitored during session    Home Living Family/patient expects to be discharged to:: Private residence Living Arrangements: Other relatives(Brother)   Type of Home: House Home Access: Stairs to enter Entrance Stairs-Rails: None Entrance Stairs-Number of Steps: 2 Home Layout:  One level Home Equipment: Tub bench;Walker - 2 wheels;Walker - 4 wheels;Cane - single point      Prior Function Level of Independence: Independent with assistive device(s)         Comments: Pt independent with ADL's and use of RW or SPC.  Has never driven.  Brother obtains groceries.     Hand Dominance   Dominant Hand: Right    Extremity/Trunk Assessment   Upper Extremity Assessment Upper Extremity Assessment: Overall WFL for tasks assessed(Grossly 4-/5 bilaterally.)    Lower Extremity Assessment Lower Extremity Assessment: Overall WFL for tasks assessed(Grossly 4/5  bilaterally.)    Cervical / Trunk Assessment Cervical / Trunk Assessment: Normal  Communication   Communication: No difficulties  Cognition Arousal/Alertness: Awake/alert Behavior During Therapy: WFL for tasks assessed/performed Overall Cognitive Status: Within Functional Limits for tasks assessed                                 General Comments: Able to follow commands.      General Comments      Exercises     Assessment/Plan    PT Assessment Patient needs continued PT services  PT Problem List Decreased strength;Decreased mobility;Decreased balance;Decreased knowledge of use of DME;Decreased activity tolerance;Pain       PT Treatment Interventions DME instruction;Therapeutic activities;Gait training;Therapeutic exercise;Stair training;Balance training;Functional mobility training;Neuromuscular re-education;Patient/family education    PT Goals (Current goals can be found in the Care Plan section)  Acute Rehab PT Goals Patient Stated Goal: To return home. PT Goal Formulation: With patient Time For Goal Achievement: 02/04/18 Potential to Achieve Goals: Fair    Frequency BID   Barriers to discharge        Co-evaluation               AM-PAC PT "6 Clicks" Daily Activity  Outcome Measure Difficulty turning over in bed (including adjusting bedclothes, sheets and blankets)?: A Little Difficulty moving from lying on back to sitting on the side of the bed? : A Little Difficulty sitting down on and standing up from a chair with arms (e.g., wheelchair, bedside commode, etc,.)?: Unable Help needed moving to and from a bed to chair (including a wheelchair)?: A Lot Help needed walking in hospital room?: Total Help needed climbing 3-5 steps with a railing? : Total 6 Click Score: 11    End of Session Equipment Utilized During Treatment: Gait belt Activity Tolerance: Patient limited by pain;Patient limited by fatigue Patient left: in chair;with call  bell/phone within reach;with chair alarm set   PT Visit Diagnosis: Unsteadiness on feet (R26.81);History of falling (Z91.81);Pain Pain - Right/Left: Left Pain - part of body: Leg;Knee    Time: 1305-1330 PT Time Calculation (min) (ACUTE ONLY): 25 min   Charges:   PT Evaluation $PT Eval Low Complexity: 1 Low     PT G Codes:   PT G-Codes **NOT FOR INPATIENT CLASS** Functional Assessment Tool Used: AM-PAC 6 Clicks Basic Mobility    Roxanne Gates, PT, DPT   Roxanne Gates 02/04/2018, 1:39 PM

## 2018-02-05 DIAGNOSIS — S82392A Other fracture of lower end of left tibia, initial encounter for closed fracture: Secondary | ICD-10-CM | POA: Diagnosis not present

## 2018-02-05 LAB — CBC
HCT: 24.4 % — ABNORMAL LOW (ref 35.0–47.0)
Hemoglobin: 8.3 g/dL — ABNORMAL LOW (ref 12.0–16.0)
MCH: 28.4 pg (ref 26.0–34.0)
MCHC: 33.9 g/dL (ref 32.0–36.0)
MCV: 83.9 fL (ref 80.0–100.0)
Platelets: 260 10*3/uL (ref 150–440)
RBC: 2.91 MIL/uL — ABNORMAL LOW (ref 3.80–5.20)
RDW: 19.7 % — ABNORMAL HIGH (ref 11.5–14.5)
WBC: 7.7 10*3/uL (ref 3.6–11.0)

## 2018-02-05 LAB — BASIC METABOLIC PANEL
Anion gap: 9 (ref 5–15)
BUN: 13 mg/dL (ref 6–20)
CO2: 24 mmol/L (ref 22–32)
Calcium: 8.5 mg/dL — ABNORMAL LOW (ref 8.9–10.3)
Chloride: 106 mmol/L (ref 101–111)
Creatinine, Ser: 0.64 mg/dL (ref 0.44–1.00)
GFR calc Af Amer: >60 mL/min (ref >60)
GFR calc non Af Amer: >60 mL/min (ref >60)
Glucose, Bld: 110 mg/dL — ABNORMAL HIGH (ref 65–99)
Potassium: 3.7 mmol/L (ref 3.5–5.1)
Sodium: 139 mmol/L (ref 135–145)

## 2018-02-05 LAB — GLUCOSE, CAPILLARY
Glucose-Capillary: 118 mg/dL — ABNORMAL HIGH (ref 65–99)
Glucose-Capillary: 121 mg/dL — ABNORMAL HIGH (ref 65–99)
Glucose-Capillary: 115 mg/dL — ABNORMAL HIGH (ref 65–99)

## 2018-02-05 LAB — PROTIME-INR
INR: 1.2
Prothrombin Time: 15.1 seconds (ref 11.4–15.2)

## 2018-02-05 MED ORDER — SENNA 8.6 MG PO TABS: 1.0000 | ORAL_TABLET | Freq: Every day | ORAL | Status: DC

## 2018-02-05 MED ORDER — METHYLNALTREXONE BROMIDE 12 MG/0.6ML ~~LOC~~ SOLN
12.0000 mg | Freq: Once | SUBCUTANEOUS | Status: AC
  Administered 2018-02-05: 12 mg via SUBCUTANEOUS
  Filled 2018-02-05: qty 0.6

## 2018-02-05 MED ORDER — LACTULOSE 10 GM/15ML PO SOLN: 20.0000 g | Freq: Two times a day (BID) | ORAL | Status: DC | PRN

## 2018-02-05 MED ORDER — METOPROLOL TARTRATE 25 MG PO TABS: 12.5000 mg | ORAL_TABLET | Freq: Two times a day (BID) | ORAL | Status: DC

## 2018-02-05 MED ORDER — LISINOPRIL 10 MG PO TABS: 10.0000 mg | ORAL_TABLET | Freq: Every day | ORAL | Status: DC

## 2018-02-05 MED ORDER — BISACODYL 10 MG RE SUPP
10.0000 mg | Freq: Every day | RECTAL | Status: DC
  Administered 2018-02-05: 10 mg via RECTAL
  Filled 2018-02-05: qty 1

## 2018-02-05 MED ORDER — MAGNESIUM CITRATE PO SOLN
1.0000 | Freq: Once | ORAL | Status: AC
  Administered 2018-02-05: 1 via ORAL
  Filled 2018-02-05: qty 296

## 2018-02-05 MED ORDER — WARFARIN SODIUM 5 MG PO TABS
5.0000 mg | ORAL_TABLET | Freq: Every day | ORAL | Status: DC
  Administered 2018-02-05: 5 mg via ORAL
  Filled 2018-02-05: qty 1

## 2018-02-05 MED ORDER — FLEET ENEMA 7-19 GM/118ML RE ENEM
1.0000 | ENEMA | Freq: Once | RECTAL | Status: AC
  Administered 2018-02-05: 1 via RECTAL

## 2018-02-05 MED ORDER — OXYCODONE HCL 5 MG PO TABS: 5.0000 mg | ORAL_TABLET | ORAL | 0 refills | Status: DC | PRN

## 2018-02-05 NOTE — Progress Notes (Signed)
ANTICOAGULATION CONSULT NOTE -  Pharmacy Consult for Warfarin Indication: atrial fibrillation  Allergies  Allergen Reactions  . Percocet [Oxycodone-Acetaminophen]   . Penicillins Rash    Has patient had a PCN reaction causing immediate rash, facial/tongue/throat swelling, SOB or lightheadedness with hypotension: Yes Has patient had a PCN reaction causing severe rash involving mucus membranes or skin necrosis: Unknown Has patient had a PCN reaction that required hospitalization: Unknown Has patient had a PCN reaction occurring within the last 10 years: No If all of the above answers are "NO", then may proceed with Cephalosporin use.    Patient Measurements: Height: 5\' 6"  (167.6 cm) Weight: 246 lb (111.6 kg) IBW/kg (Calculated) : 59.3 Heparin Dosing Weight:    Vital Signs: Temp: 97.8 F (36.6 C) (05/16 0741) Temp Source: Oral (05/16 0020) BP: 128/65 (05/16 0741) Pulse Rate: 96 (05/16 0741)  Labs: Recent Labs    02/02/18 1256  02/03/18 0628 02/04/18 0414 02/05/18 0450  HGB 10.8*  --   --  8.3* 8.3*  HCT 33.1*  --   --  25.5* 24.4*  PLT 277  --   --  243 260  APTT 36  --   --   --   --   LABPROT 23.9*   < > 17.9* 16.2* 15.1  INR 2.16   < > 1.49 1.31 1.20  CREATININE 0.68  --   --  0.73 0.64   < > = values in this interval not displayed.    Estimated Creatinine Clearance: 78.1 mL/min (by C-G formula based on SCr of 0.64 mg/dL).   Medical History: Past Medical History:  Diagnosis Date  . Cancer ([place])    Breast and lung  . Diabetes mellitus without complication ([place])   . GERD (gastroesophageal reflux disease)   . Heart disease   . Hypertension     Medications:  Scheduled:  . atorvastatin  80 mg Oral Daily  . docusate sodium  100 mg Oral BID  . enoxaparin (LOVENOX) injection  40 mg Subcutaneous Q24H  . ferrous sulfate  325 mg Oral Q breakfast  . gabapentin  300 mg Oral QHS  . insulin aspart  0-9 Units Subcutaneous TID WC  . lisinopril  10 mg Oral Daily  .  metoprolol tartrate  12.5 mg Oral BID  . mometasone-formoterol  2 puff Inhalation BID  . pantoprazole  40 mg Oral Daily  . polyethylene glycol  17 g Oral Daily  . pramipexole  0.25 mg Oral QHS  . senna  1 tablet Oral Daily  . tiotropium  1 capsule Inhalation Daily  . triamterene-hydrochlorothiazide  1 tablet Oral Daily  . Warfarin - Pharmacist Dosing Inpatient   Does not apply q1800   Infusions:    Assessment: 77 yo F with ankle fracture, on Warfarin PTA for Afib.  Home dose per Anticoagulation visit note from 01/29/18: 3.75 mg on Thursdays and 5 mg on all other days. Hgb 10.8,  Plt 277  5/13  INR  2.16      * Vit K 1 mg IV x 2 doses given d/t planned surgery for ankle fracture 5/14  INR  1.49   Dose HELD for surgery 5/15  INR  1.31   Warfarin 7.5mg  5/16  INR  1.20  Goal of Therapy:  INR 2-3 Monitor platelets by anticoagulation protocol: Yes   Plan:  INR is subtherapeutic, probably still affected by Vit K given preop. Anticipate that INR will start to trend back up with restart of warfarin. Will  order Warfarin 5mg  daily, daily INR checks.   Paulina Fusi, PharmD, BCPS 02/05/2018 9:17 AM

## 2018-02-05 NOTE — Progress Notes (Signed)
  Subjective: 2 Days Post-Op Procedure(s) (LRB): OPEN REDUCTION INTERNAL FIXATION (ORIF) DISTAL TIBIA FRACTURE (Left) Patient reports pain as mild.   Patient is well but is unable to void on her own and has not had a BM at this time Plan is for discharge to SNF today. Negative for chest pain and shortness of breath Fever: no Gastrointestinal:Negative for nausea and vomiting, states that she is passing gas.  Objective: Vital signs in last 24 hours: Temp:  [97.4 F (36.3 C)-98.2 F (36.8 C)] 97.8 F (36.6 C) (05/16 0741) Pulse Rate:  [91-100] 96 (05/16 0741) Resp:  [18-19] 19 (05/16 0020) BP: (105-128)/(56-66) 128/65 (05/16 0741) SpO2:  [93 %-100 %] 93 % (05/16 0741)  Intake/Output from previous day:  Intake/Output Summary (Last 24 hours) at 02/05/2018 1013 Last data filed at 02/05/2018 0900 Gross per 24 hour  Intake 480 ml  Output 250 ml  Net 230 ml    Intake/Output this shift: Total I/O In: 240 [P.O.:240] Out: -   Labs: Recent Labs    02/02/18 1256 02/04/18 0414 02/05/18 0450  HGB 10.8* 8.3* 8.3*   Recent Labs    02/04/18 0414 02/05/18 0450  WBC 7.8 7.7  RBC 3.21* 2.91*  HCT 25.5* 24.4*  PLT 243 260   Recent Labs    02/04/18 0414 02/05/18 0450  NA 138 139  K 4.2 3.7  CL 105 106  CO2 29 24  BUN 13 13  CREATININE 0.73 0.64  GLUCOSE 124* 110*  CALCIUM 8.2* 8.5*   Recent Labs    02/04/18 0414 02/05/18 0450  INR 1.31 1.20     EXAM General - Patient is Alert, Appropriate and Oriented Extremity - ABD soft Sensation intact distally No cellulitis present  Intact to light touch to the dorsal and volar aspect of toes. Dressing/Incision - Splint intact to the left foot. Motor Function - intact, moving toes well on exam.  Negative Homan's to the right leg. Abdomen soft with good BS.  Past Medical History:  Diagnosis Date  . Cancer ([place])    Breast and lung  . Diabetes mellitus without complication ([place])   . GERD (gastroesophageal reflux  disease)   . Heart disease   . Hypertension     Assessment/Plan: 2 Days Post-Op Procedure(s) (LRB): OPEN REDUCTION INTERNAL FIXATION (ORIF) DISTAL TIBIA FRACTURE (Left) Active Problems:   Closed extra-articular fracture of distal tibia  Estimated body mass index is 39.71 kg/m as calculated from the following:   Height as of this encounter: 5\' 6"  (1.676 m).   Weight as of this encounter: 111.6 kg (246 lb). Advance diet Up with therapy   Labs reviewed this AM.  Hg 8.3 this AM. Pt is passing gas without pain.  Pt has not had a BM, will move on to FLEET enema if needed today. Pt still has catheter, will continue to wean off.  If needed will discharge to SNF with catheter in place and continue bladder training. PT to work with patient today, currently recommending SNF. Plan will be for discharge to SNF today.  DVT Prophylaxis - Coumadin, Foot Pumps and TED hose Non-weightbearing to the left leg.  Raquel Tacara Hadlock, PA-C [facility] 02/05/2018, 10:13 AM

## 2018-02-05 NOTE — Care Management (Signed)
[facility] notified of change in [place]. [facility] notified so they can pick up [facility].

## 2018-02-05 NOTE — Progress Notes (Signed)
[facility] NAME: Tracy Dickerson    MR#:  [id number]  DATE OF BIRTH:  1941-03-26  SUBJECTIVE:   Feels constipated this morning REVIEW OF SYSTEMS:    Review of Systems  Constitutional: Negative for fever, chills weight loss HENT: Negative for ear pain, nosebleeds, congestion, facial swelling, rhinorrhea, neck pain, neck stiffness and ear discharge.   Respiratory: Negative for cough, positive for mild shortness of breath, no wheezing  Cardiovascular: Negative for chest pain, palpitations and leg swelling.  Gastrointestinal: Negative for heartburn, abdominal pain, vomiting, diarrhea positive consitpation Genitourinary: Negative for dysuria, urgency, frequency, hematuria Musculoskeletal: Negative for back pain or  Positive pain from surgery Neurological: Negative for dizziness, seizures, syncope, focal weakness,  numbness and headaches.  Hematological: Does not bruise/bleed easily.  Psychiatric/Behavioral: Negative for hallucinations, confusion, dysphoric mood    Tolerating Diet:  yes      DRUG ALLERGIES:   Allergies  Allergen Reactions  . Percocet [Oxycodone-Acetaminophen]   . Penicillins Rash    Has patient had a PCN reaction causing immediate rash, facial/tongue/throat swelling, SOB or lightheadedness with hypotension: Yes Has patient had a PCN reaction causing severe rash involving mucus membranes or skin necrosis: Unknown Has patient had a PCN reaction that required hospitalization: Unknown Has patient had a PCN reaction occurring within the last 10 years: No If all of the above answers are "NO", then may proceed with Cephalosporin use.    VITALS:  Blood pressure 128/65, pulse 96, temperature 97.8 F (36.6 C), resp. rate 19, height 5\' 6"  (1.676 m), weight 111.6 kg (246 lb), SpO2 93 %.  PHYSICAL EXAMINATION:  Constitutional: Appears well-developed and well-nourished. No distress. HENT: Normocephalic. Marland Kitchen Oropharynx is  clear and moist.  Eyes: Conjunctivae and EOM are normal. PERRLA, no scleral icterus.  Neck: Normal ROM. Neck supple. No JVD. No tracheal deviation. CVS: RRR, S1/S2 +, no murmurs, no gallops, no carotid bruit.  Pulmonary: Effort and breath sounds normal, no stridor, rhonchi, wheezes, rales.  Abdominal: Soft. BS +, mild distension and tenderness, no rebound or guarding.  Musculoskeletal: Ankle is wrapped. No edema and no tenderness.  Neuro: Alert. CN 2-12 grossly intact. No focal deficits. Skin: Skin is warm and dry. No rash noted. Psychiatric: Normal mood and affect.      LABORATORY PANEL:   CBC Recent Labs  Lab 02/05/18 0450  WBC 7.7  HGB 8.3*  HCT 24.4*  PLT 260   ------------------------------------------------------------------------------------------------------------------  Chemistries  Recent Labs  Lab 02/05/18 0450  NA 139  K 3.7  CL 106  CO2 24  GLUCOSE 110*  BUN 13  CREATININE 0.64  CALCIUM 8.5*   ------------------------------------------------------------------------------------------------------------------  Cardiac Enzymes Recent Labs  Lab 01/31/18 0936  TROPONINI <0.03   ------------------------------------------------------------------------------------------------------------------  RADIOLOGY:  Dg Chest 1 View  Result Date: 02/04/2018 CLINICAL DATA:  Abdominal pain and difficulty breathing EXAM: CHEST  1 VIEW COMPARISON:  02/02/2018 FINDINGS: Cardiac shadow is within normal limits. Hyperinflation of the left lung is noted similar to that seen on the prior exam. Some postsurgical changes with volume loss are seen. No focal infiltrate or sizable effusion is seen. No bony abnormality is noted. IMPRESSION: No acute abnormality noted. Electronically Signed   By: Inez Catalina M.D.   On: 02/04/2018 11:12   Dg Tibia/fibula Left  Result Date: 02/03/2018 CLINICAL DATA:  Tibial fracture EXAM: LEFT TIBIA AND FIBULA - 2 VIEW; DG C-ARM 61-120 MIN COMPARISON:   02/02/2018 FINDINGS: Four low resolution intraoperative spot views  of the left tibia and fibula. Total fluoroscopy time was 45 seconds. Surgical plate and screw fixation of the tibia across comminuted distal tibial fracture. Additional distal fibular fracture with anatomic alignment. IMPRESSION: Intraoperative fluoroscopic assistance provided during surgical fixation of tibial fracture Electronically Signed   By: Donavan Foil M.D.   On: 02/03/2018 20:06   Dg Abd 1 View  Result Date: 02/04/2018 CLINICAL DATA:  Abdominal pain for several days EXAM: ABDOMEN - 1 VIEW COMPARISON:  None. FINDINGS: Scattered large and small bowel gas is noted. Mild fecal material is noted within the colon although no definitive obstruction is seen. No acute bony abnormality is noted. No abnormal mass or abnormal calcifications are noted. IMPRESSION: No acute abnormality noted. Electronically Signed   By: Inez Catalina M.D.   On: 02/04/2018 11:11   Dg C-arm 1-60 Min  Result Date: 02/03/2018 CLINICAL DATA:  Tibial fracture EXAM: LEFT TIBIA AND FIBULA - 2 VIEW; DG C-ARM 61-120 MIN COMPARISON:  02/02/2018 FINDINGS: Four low resolution intraoperative spot views of the left tibia and fibula. Total fluoroscopy time was 45 seconds. Surgical plate and screw fixation of the tibia across comminuted distal tibial fracture. Additional distal fibular fracture with anatomic alignment. IMPRESSION: Intraoperative fluoroscopic assistance provided during surgical fixation of tibial fracture Electronically Signed   By: Donavan Foil M.D.   On: 02/03/2018 20:06     ASSESSMENT AND PLAN:   77 year old female with PAF on Coumadin, lung, breast and skin cancer in remission and diabetes who presents to the emergency room after mechanical fall.  1.    Ankle fracture:  Patient postoperative day #2 ORIF left distal tibia fracture Management as per orthopedic surgery PT prophylaxis as per orthopedic surgery Acute on chronic anemia from blood loss  from surgery.  Hemoglobin is stable.  Patient does not require blood transfusion.  2.  PAF: Patient should continue on Coumadin and metoprolol for heart rate control. She should have INR checked daily at the facility.    3.  Diabetes: Should be discharged on ADA diet with outpatient regimen 4.  Essential hypertension: Continue  Maxide, metoprolol, lisinopril Please use current doses of medications as I did decrease the dosages of her home medications.  5.  Hyperlipidemia: Continue statin  6.  History of Barrett's esophagus 7.  Constipation: Still has not had a bowel movement and not added suppository and enema.  8.  Shortness of breath: Chest x-ray is negative and she is not complaining shortness of breath today  She does plan for discharge today.  Hospital service will kindly sign off please call if you have any questions.  Management plans discussed with the patient and she is in agreement.  CODE STATUS: full  TOTAL TIME TAKING CARE OF THIS PATIENT: 28 minutes.     POSSIBLE D/C 1-2 days, DEPENDING ON CLINICAL CONDITION.   Maurizio Geno M.D on 02/05/2018 at 9:15 AM  Between 7am to 6pm - Pager - [phone] After 6pm go to [email or web address] - password [email or web address] [facility]  Office  [phone]  CC: Primary care physician; Boykin Nearing, MD  Note: This dictation was prepared with Dragon dictation along with smaller phrase technology. Any transcriptional errors that result from this process are unintentional.

## 2018-02-05 NOTE — Progress Notes (Signed)
Clinical Education officer, museum (CSW) presented bed offers to patient. She chose [facility]. Per [facility] admissions coordinator at [facility] she will start [facility] SNF authorization today.   [employer], LCSW [phone]

## 2018-02-05 NOTE — Care Management CC44 (Signed)
Condition Code 44 Documentation Completed  Patient Details  Name: Tracy Dickerson MRN: [id number] Date of Birth: 1941-01-06   Condition Code 44 given:  Yes Patient signature on Condition Code 44 notice:  Yes Documentation of 2 MD's agreement:  Yes Code 44 added to claim:  Yes    Jolly Mango, RN 02/05/2018, 11:00 AM

## 2018-02-05 NOTE — Discharge Instructions (Signed)
Diet: As you were doing prior to hospitalization   Shower:  May shower but keep the wounds dry, use an occlusive plastic wrap, NO SOAKING IN TUB.  If the bandage gets wet, change with a clean dry gauze.  Dressing:  You may change your dressing as needed. Change the dressing with sterile gauze dressing.    Activity:  Increase activity slowly as tolerated, but follow the weight bearing instructions below.  No lifting or driving for 6 weeks.  Weight Bearing:   Non-weightbearing to the left leg.  To prevent constipation: you may use a stool softener such as -  Colace (over the counter) 100 mg by mouth twice a day  Drink plenty of fluids (prune juice may be helpful) and high fiber foods Miralax (over the counter) for constipation as needed.    Itching:  If you experience itching with your medications, try taking only a single pain pill, or even half a pain pill at a time.  You may take up to 10 pain pills per day, and you can also use benadryl over the counter for itching or also to help with sleep.   Precautions:  If you experience chest pain or shortness of breath - call 911 immediately for transfer to the hospital emergency department!!  If you develop a fever greater that 101 F, purulent drainage from wound, increased redness or drainage from wound, or calf pain-Call [facility]                                              Follow- Up Appointment:  Please call for an appointment to be seen in 2 weeks at [facility]

## 2018-02-05 NOTE — Progress Notes (Signed)
Pt still unable to void, bladder scan done again and revealed 520ml. Dr Marcille Blanco paged and said to do in and out catheter if bladder scan result is equal or more than 61ml. Order placed.

## 2018-02-05 NOTE — Clinical Social Work Placement (Addendum)
   CLINICAL SOCIAL WORK PLACEMENT  NOTE  Date:  02/05/2018  Patient Details  Name: Tracy Dickerson MRN: [id number] Date of Birth: [birth year]  Clinical Social Work is seeking post-discharge placement for this patient at the [facility] level of care (*CSW will initial, date and re-position this form in  chart as items are completed):  Yes   Patient/family provided with [facility] Work Department's list of facilities offering this level of care within the geographic area requested by the patient (or if unable, by the patient's family).  Yes   Patient/family informed of their freedom to choose among providers that offer the needed level of care, that participate in Medicare, Medicaid or managed care program needed by the patient, have an available bed and are willing to accept the patient.  Yes   Patient/family informed of [facility]'s ownership interest in [facility] and [facility], as well as of the fact that they are under no obligation to receive care at these facilities.  PASRR submitted to EDS on 02/03/18     PASRR number received on 02/03/18     Existing PASRR number confirmed on       FL2 transmitted to all facilities in geographic area requested by pt/family on 02/04/18     FL2 transmitted to all facilities within larger geographic area on       Patient informed that his/her managed care company has contracts with or will negotiate with certain facilities, including the following:        Yes   Patient/family informed of bed offers received.  Patient chooses bed at [facility] )     Physician recommends and patient chooses bed at      Patient to be transferred to [facility] ) on 02/05/18.  Patient to be transferred to facility by [facility] )     Patient family notified on 02/05/18 of transfer.  Name of family member notified:  (CSW left patient's brother Tracy Dickerson a Advertising account executive. ) Patient's brother Tracy Dickerson  called CSW back and was made aware of D/C today.   PHYSICIAN       Additional Comment:    _______________________________________________ Taavi Hoose, Veronia Beets, LCSW 02/05/2018, 11:01 AM

## 2018-02-05 NOTE — Progress Notes (Signed)
PT Cancellation Note  Patient Details Name: Tracy Dickerson MRN: [id number] DOB: 11/10/1940   Cancelled Treatment:    Reason Eval/Treat Not Completed: Patient at procedure or test/unavailable.  Pt currently with nursing receiving and enema.  PT will hold treatment until later this morning.     Roxanne Gates, PT, DPT 02/05/2018, 10:13 AM

## 2018-02-05 NOTE — Progress Notes (Signed)
EMS at bedside to transfer pt to [facility] care. Pt belongings with pt at transfer and discharge packet with EMS personal. piv d/c'd. Vs= 119/66, hr=92, rr=18, sat on ra=95%, temp orally =97.4

## 2018-02-05 NOTE — Discharge Summary (Addendum)
Physician Discharge Summary  Patient ID: Tracy Dickerson MRN: [id number] DOB/AGE: 04/15/1941 77 y.o.  Admit date: 02/02/2018 Discharge date: 02/05/2018  Admission Diagnoses:  Contusion of right hand, initial encounter [S60.221A] Fall, initial encounter [W19.XXXA] Displaced comminuted fracture of shaft of left fibula, initial encounter for closed fracture [S82.452A] Displaced comminuted fracture of shaft of left tibia, initial encounter for closed fracture [S82.252A] Abrasion, left knee, initial encounter [S80.212A] Closed fracture of proximal end of left fibula, unspecified fracture morphology, initial encounter [G31.517O]  Discharge Diagnoses: Patient Active Problem List   Diagnosis Date Noted  . Closed extra-articular fracture of distal tibia 02/02/2018  . Pneumonia 10/29/2017  . Hypotension 01/26/2017    Past Medical History:  Diagnosis Date  . Cancer ([place])    Breast and lung  . Diabetes mellitus without complication ([place])   . GERD (gastroesophageal reflux disease)   . Heart disease   . Hypertension    Transfusion: None.   Consultants (if any): Dr. Bettey Costa  Discharged Condition: Improved  Hospital Course: Tracy Dickerson is an 77 y.o. female who was admitted 02/02/2018 with a diagnosis of a closed displaced left distal tibia and fibular fractures and went to the operating room on 02/03/2018 and underwent the above named procedures.    Surgeries: Procedure(s): OPEN REDUCTION INTERNAL FIXATION (ORIF) DISTAL TIBIA FRACTURE on 02/03/2018 Patient tolerated the surgery well. Taken to PACU where she was stabilized and then transferred to the orthopedic floor.  Continued on Warfarin, dose while in the hospital was changed to 5mg  and the patient was also given a one time dose of Warfarin 7.5mg . Foot pumps applied bilaterally at 80 mm. Heels elevated on bed with rolled towels. No evidence of DVT. Negative [place]oman to the right leg. Physical therapy started on day #1 for gait training  and transfer. OT started day #1 for ADL and assisted devices.  Patient's IV was removed on POD1.  Pt had difficulty with voiding on her own so a in-and-out catheter has been used and a catheter is currently being used.  Pt will continue to work on bladder exercises and discharge today with or without catheter.  If catheter is needed will continue bladder training at SNF.  Implants: Biomet ALPS 9-hole precontoured distal tibial plate  She was given perioperative antibiotics:  Anti-infectives (From admission, onward)   Start     Dose/Rate Route Frequency Ordered Stop   02/03/18 2100  ceFAZolin (ANCEF) IVPB 2g/100 mL premix     2 g 200 mL/hr over 30 Minutes Intravenous Every 6 hours 02/03/18 2052 02/04/18 1459   02/03/18 1600  clindamycin (CLEOCIN) IVPB 900 mg     900 mg 100 mL/hr over 30 Minutes Intravenous  Once 02/02/18 1259 02/03/18 1857    .  She was given sequential compression devices, early ambulation, and Warfarin for DVT prophylaxis.  She benefited maximally from the hospital stay and there were no complications.    Recent vital signs:  Vitals:   02/05/18 0020 02/05/18 0741  BP: (!) 119/56 128/65  Pulse: 91 96  Resp: 19   Temp: 98 F (36.7 C) 97.8 F (36.6 C)  SpO2: 96% 93%    Recent laboratory studies:  Lab Results  Component Value Date   HGB 8.3 (L) 02/05/2018   HGB 8.3 (L) 02/04/2018   HGB 10.8 (L) 02/02/2018   Lab Results  Component Value Date   WBC 7.7 02/05/2018   PLT 260 02/05/2018   Lab Results  Component Value Date   INR 1.20  02/05/2018   Lab Results  Component Value Date   NA 139 02/05/2018   K 3.7 02/05/2018   CL 106 02/05/2018   CO2 24 02/05/2018   BUN 13 02/05/2018   CREATININE 0.64 02/05/2018   GLUCOSE 110 (H) 02/05/2018    Discharge Medications:   Allergies as of 02/05/2018      Reactions   Percocet [oxycodone-acetaminophen]    Penicillins Rash   Has patient had a PCN reaction causing immediate rash, facial/tongue/throat  swelling, SOB or lightheadedness with hypotension: Yes Has patient had a PCN reaction causing severe rash involving mucus membranes or skin necrosis: Unknown Has patient had a PCN reaction that required hospitalization: Unknown Has patient had a PCN reaction occurring within the last 10 years: No If all of the above answers are "NO", then may proceed with Cephalosporin use.      Medication List    STOP taking these medications   diazepam 5 MG tablet Commonly known as:  VALIUM   diclofenac sodium 1 % Gel Commonly known as:  VOLTAREN   diphenhydrAMINE 25 mg capsule Commonly known as:  BENADRYL   ferrous sulfate 160 (50 Fe) MG Tbcr SR tablet Commonly known as:  EQL SLOW RELEASE IRON   metoCLOPramide 10 MG tablet Commonly known as:  REGLAN   naproxen 500 MG tablet Commonly known as:  NAPROSYN   traMADol 50 MG tablet Commonly known as:  ULTRAM     TAKE these medications   ADVAIR DISKUS 250-50 MCG/DOSE Aepb Generic drug:  Fluticasone-Salmeterol Inhale 1 puff into the lungs 2 (two) times daily.   atorvastatin 80 MG tablet Commonly known as:  LIPITOR Take 80 mg by mouth daily.   benzonatate 100 MG capsule Commonly known as:  TESSALON PERLES Take 1 capsule (100 mg total) by mouth every 6 (six) hours as needed for cough.   gabapentin 300 MG capsule Commonly known as:  NEURONTIN Take 1 capsule (300 mg total) by mouth at bedtime.   lisinopril 10 MG tablet Commonly known as:  PRINIVIL,ZESTRIL Take 1 tablet (10 mg total) by mouth daily. What changed:    medication strength  how much to take   metFORMIN 500 MG tablet Commonly known as:  GLUCOPHAGE Take 1 tablet (500 mg total) by mouth 2 (two) times daily.   metoprolol tartrate 25 MG tablet Commonly known as:  LOPRESSOR Take 0.5 tablets (12.5 mg total) by mouth 2 (two) times daily. What changed:    how much to take  additional instructions   omeprazole 40 MG capsule Commonly known as:  PRILOSEC Take 40 mg by  mouth daily.   oxyCODONE 5 MG immediate release tablet Commonly known as:  Oxy IR/ROXICODONE Take 1-2 tablets (5-10 mg total) by mouth every 4 (four) hours as needed for moderate pain (pain score 4-6).   pramipexole 0.25 MG tablet Commonly known as:  MIRAPEX Take 0.25 mg by mouth at bedtime.   PROAIR HFA 108 (90 Base) MCG/ACT inhaler Generic drug:  albuterol Inhale 2 puffs into the lungs every 4 (four) hours as needed.   SLOW FE 142 (45 Fe) MG Tbcr Generic drug:  Ferrous Sulfate Take 1 tablet by mouth daily.   SPIRIVA HANDIHALER 18 MCG inhalation capsule Generic drug:  tiotropium Place 1 capsule into inhaler and inhale daily.   triamterene-hydrochlorothiazide 37.5-25 MG tablet Commonly known as:  MAXZIDE-25 Take 1 tablet by mouth daily.   warfarin 2.5 MG tablet Commonly known as:  COUMADIN Take 3.75-5 mg by mouth daily. Take 3.75MG   ON ThursdayS. 5MG  ON Monday,TUESDAY,WEDNESDAY,FRIDAY,SATURDAY,AND SUNDAY            Durable Medical Equipment  (From admission, onward)        Start     Ordered   02/03/18 2053  DME 3 n 1  Once     05 /14/19 2052     Diagnostic Studies: Dg Chest 1 View  Result Date: 02/04/2018 CLINICAL DATA:  Abdominal pain and difficulty breathing EXAM: CHEST  1 VIEW COMPARISON:  02/02/2018 FINDINGS: Cardiac shadow is within normal limits. Hyperinflation of the left lung is noted similar to that seen on the prior exam. Some postsurgical changes with volume loss are seen. No focal infiltrate or sizable effusion is seen. No bony abnormality is noted. IMPRESSION: No acute abnormality noted. Electronically Signed   By: Inez Catalina M.D.   On: 02/04/2018 11:12   Dg Chest 1 View  Result Date: 02/02/2018 CLINICAL DATA:  Recent fall EXAM: CHEST  1 VIEW COMPARISON:  01/31/2018 FINDINGS: Cardiac shadow is within normal limits. Lungs are well aerated with some mild volume loss on the right secondary to prior surgery. No focal infiltrate or sizable effusion is noted.  No bony abnormality is seen. IMPRESSION: Postsurgical changes with volume loss on the right. No acute abnormality noted. Electronically Signed   By: Inez Catalina M.D.   On: 02/02/2018 12:21   Dg Chest 2 View  Result Date: 01/31/2018 CLINICAL DATA:  Left-sided chest pain for 3 months. EXAM: CHEST - 2 VIEW COMPARISON:  Jan 21, 2018 FINDINGS: No pneumothorax. Surgical clips seen in the left axilla. Mild atelectasis or scar in the left base, unchanged. The cardiomediastinal silhouette is stable. Interstitial lung disease seen in the bases, right greater than left, unchanged. No interval changes. IMPRESSION: Chronic interstitial changes in the lung bases, right greater than left. No acute abnormality. Electronically Signed   By: Dorise Bullion III M.D   On: 01/31/2018 10:24   Dg Chest 2 View  Result Date: 01/21/2018 CLINICAL DATA:  Short of breath and dizziness EXAM: CHEST - 2 VIEW COMPARISON:  11/24/2017 FINDINGS: Upper normal heart size. Interstitial lung disease is stable. There is elevation of the right hemidiaphragm. No pneumothorax. No pleural effusion. IMPRESSION: Stable interstitial lung disease. Electronically Signed   By: Marybelle Killings M.D.   On: 01/21/2018 15:27   Dg Knee 2 Views Left  Result Date: 02/02/2018 CLINICAL DATA:  Recent fall with left knee pain, initial encounter EXAM: LEFT KNEE - 2 VIEW COMPARISON:  None. FINDINGS: Minimally displaced fracture of the left fibular neck is noted. No soft tissue abnormality is seen. No other fracture is noted. IMPRESSION: Proximal fibular fracture with minimal displacement. Electronically Signed   By: Inez Catalina M.D.   On: 02/02/2018 12:24   Dg Tibia/fibula Left  Result Date: 02/03/2018 CLINICAL DATA:  Tibial fracture EXAM: LEFT TIBIA AND FIBULA - 2 VIEW; DG C-ARM 61-120 MIN COMPARISON:  02/02/2018 FINDINGS: Four low resolution intraoperative spot views of the left tibia and fibula. Total fluoroscopy time was 45 seconds. Surgical plate and screw  fixation of the tibia across comminuted distal tibial fracture. Additional distal fibular fracture with anatomic alignment. IMPRESSION: Intraoperative fluoroscopic assistance provided during surgical fixation of tibial fracture Electronically Signed   By: Donavan Foil M.D.   On: 02/03/2018 20:06   Dg Ankle Complete Left  Result Date: 02/02/2018 CLINICAL DATA:  Recent fall with left ankle pain, initial encounter EXAM: LEFT ANKLE COMPLETE - 3+ VIEW COMPARISON:  None. FINDINGS: Comminuted fracture  of the distal tibial metaphysis is seen. Additionally oblique fracture through the distal fibula is noted. Mild posterior angulation of the distal tibial fracture fragments is noted. Calcaneal spurring is seen. IMPRESSION: Distal tibial and fibular fractures as described. Electronically Signed   By: Inez Catalina M.D.   On: 02/02/2018 12:21   Dg Abd 1 View  Result Date: 02/04/2018 CLINICAL DATA:  Abdominal pain for several days EXAM: ABDOMEN - 1 VIEW COMPARISON:  None. FINDINGS: Scattered large and small bowel gas is noted. Mild fecal material is noted within the colon although no definitive obstruction is seen. No acute bony abnormality is noted. No abnormal mass or abnormal calcifications are noted. IMPRESSION: No acute abnormality noted. Electronically Signed   By: Inez Catalina M.D.   On: 02/04/2018 11:11   Dg Hand 2 View Right  Result Date: 02/02/2018 CLINICAL DATA:  Recent fall with right hand pain, initial encounter EXAM: RIGHT HAND - 2 VIEW COMPARISON:  10/31/2016 FINDINGS: No acute fracture or dislocation is noted. Mild degenerative changes in the first [facility] joint are noted. No soft tissue abnormality is seen. IMPRESSION: No acute abnormality noted. Electronically Signed   By: Inez Catalina M.D.   On: 02/02/2018 12:22   Dg C-arm 1-60 Min  Result Date: 02/03/2018 CLINICAL DATA:  Tibial fracture EXAM: LEFT TIBIA AND FIBULA - 2 VIEW; DG C-ARM 61-120 MIN COMPARISON:  02/02/2018 FINDINGS: Four low resolution  intraoperative spot views of the left tibia and fibula. Total fluoroscopy time was 45 seconds. Surgical plate and screw fixation of the tibia across comminuted distal tibial fracture. Additional distal fibular fracture with anatomic alignment. IMPRESSION: Intraoperative fluoroscopic assistance provided during surgical fixation of tibial fracture Electronically Signed   By: Donavan Foil M.D.   On: 02/03/2018 20:06   Disposition: Plan will be for discharge to SNF today.  Daily PT/INR checks required at rehab following discharge.   Contact information for follow-up providers    Boykin Nearing, MD Follow up in 1 week(s).   Specialty:  Internal Medicine Contact information: [address] [place] [address] [phone]        Lattie Corns, PA-C Follow up in 14 day(s).   Specialty:  Physician Assistant Why:  Splint removal, x-rays and staple removal. Contact information: [place] [address] [phone]            Contact information for after-discharge care    [facility] .   Service:  Skilled Nursing Contact information: [place] [phone]                 Signed: Judson Roch PA-C 02/05/2018, 10:19 AM

## 2018-02-05 NOTE — Progress Notes (Signed)
Physical Therapy Treatment Patient Details Name: Tracy Dickerson MRN: [id number] DOB: [birth year] Today's Date: 02/05/2018    History of Present Illness Patient is a 77 year old female admitted s/p ORIF of L distal tibia fracture following a fall.  PMH includes htn, heart disease, GERD, CA and DM.    PT Comments    Patient performed seated core and LE exercises at EOB during treatment.  Reported being less inhibited by pain but that she was experiencing "dizziness".  Pt was able to complete sets of 10 of all exercises without report of pain increase.  Transfer training deferred due to pt request to return to bed due to "dizziness".  PT educated pt in breathing through exercises to avoid vagal response.  Pt able to perform bed mobility today with supervision for Vc's for body mechanics.  PT discussed plan for PT moving forward and management of HEP and pt expressed understanding.  Pt will benefit from skilled PT with focus on strength, functional mobility, safe use of RW, management of HEP and pain management.   Follow Up Recommendations  SNF     Equipment Recommendations  None recommended by PT    Recommendations for Other Services       Precautions / Restrictions Precautions Precautions: Fall Restrictions Weight Bearing Restrictions: Yes LLE Weight Bearing: Non weight bearing    Mobility  Bed Mobility Overal bed mobility: Modified Independent Bed Mobility: Supine to Sit;Sit to Supine     Supine to sit: Supervision Sit to supine: Supervision   General bed mobility comments: Pt able to bring LE's over EOB and scoot up in bed with VC's.  Pt can perform partial bridge with R LE when scooting up in bed.  Transfers Overall transfer level: (Did not perform.  Pt reported dizziness throughout treatment.)                  Ambulation/Gait                 Stairs             Wheelchair Mobility    Modified Rankin (Stroke Patients Only)       Balance  Overall balance assessment: Needs assistance Sitting-balance support: Feet supported                                        Cognition Arousal/Alertness: Awake/alert Behavior During Therapy: WFL for tasks assessed/performed Overall Cognitive Status: Within Functional Limits for tasks assessed                                 General Comments: Able to follow commands.      Exercises General Exercises - Lower Extremity Hip ABduction/ADduction: Strengthening;Left;10 reps;Supine Straight Leg Raises: Strengthening;Left;10 reps;Supine Other Exercises Other Exercises: Seated lean backs, lean back with trunk rotation, lean back with march, x10 with VC's for body mechanics and breathing. Performed at EOB.  Pt reported dizziness throughout and asked to return to bed. Other Exercises: Discussed discharge plan and answered any questions pt had about what to expect with PT.    General Comments        Pertinent Vitals/Pain      Home Living                      Prior Function  PT Goals (current goals can now be found in the care plan section) Acute Rehab PT Goals Patient Stated Goal: To return home. PT Goal Formulation: With patient Time For Goal Achievement: 02/04/18 Potential to Achieve Goals: Fair Progress towards PT goals: Progressing toward goals(Demonstrated progression with bed mobility.)    Frequency    BID      PT Plan Current plan remains appropriate    Co-evaluation              AM-PAC PT "6 Clicks" Daily Activity  Outcome Measure  Difficulty turning over in bed (including adjusting bedclothes, sheets and blankets)?: A Little Difficulty moving from lying on back to sitting on the side of the bed? : A Little Difficulty sitting down on and standing up from a chair with arms (e.g., wheelchair, bedside commode, etc,.)?: A Lot Help needed moving to and from a bed to chair (including a wheelchair)?: A Lot Help  needed walking in hospital room?: Total Help needed climbing 3-5 steps with a railing? : Total 6 Click Score: 12    End of Session Equipment Utilized During Treatment: Gait belt Activity Tolerance: Patient limited by fatigue Patient left: with call bell/phone within reach;in bed;with bed alarm set Nurse Communication: Mobility status PT Visit Diagnosis: Unsteadiness on feet (R26.81);History of falling (Z91.81);Pain Pain - Right/Left: Left Pain - part of body: Leg;Knee     Time: 4585-9292 PT Time Calculation (min) (ACUTE ONLY): 22 min  Charges:  $Therapeutic Exercise: 8-22 mins $Therapeutic Activity: 8-22 mins                    G Codes:  Functional Assessment Tool Used: AM-PAC 6 Clicks Basic Mobility    Roxanne Gates, PT, DPT    Roxanne Gates 02/05/2018, 11:54 AM

## 2018-02-05 NOTE — Progress Notes (Signed)
Physical Therapy Treatment Patient Details Name: Tracy Dickerson MRN: [id number] DOB: [birth year] Today's Date: 02/05/2018    History of Present Illness Patient is a 77 year old female admitted s/p ORIF of L distal tibia fracture following a fall.  PMH includes htn, heart disease, GERD, CA and DM.    PT Comments    Patient able to perform seated exercises at bedside and demonstrate more confidence in movement and lower pain level (8/10) following there ex.  Pt is awaiting discharge pending being able to have a BM and therapist reviewed seated yoga exercises and there ex to assist in achieving this.  Pt was able to complete sets of 10 of each exercise without difficulty.  She reports cramping in L thigh and continued to drink water throughout treatment.  Pt demonstrating better bed independence with bed mobility and able to scoot up in bed without VC's.  PT will continue to benefit from skilled PT with focus on strength, tolerance to activity, pain management and functional mobility.   Follow Up Recommendations  SNF     Equipment Recommendations  None recommended by PT    Recommendations for Other Services       Precautions / Restrictions Precautions Precautions: Fall Restrictions Weight Bearing Restrictions: Yes LLE Weight Bearing: Non weight bearing    Mobility  Bed Mobility Overal bed mobility: Modified Independent Bed Mobility: Supine to Sit;Sit to Supine     Supine to sit: Supervision Sit to supine: Supervision   General bed mobility comments: Pt able to bring LE's over EOB and scoot up in bed with VC's.  Pt can perform partial bridge with R LE when scooting up in bed.  Transfers Overall transfer level: (Did not perform.  Pt reported dizziness throughout treatment.)                  Ambulation/Gait                 Stairs             Wheelchair Mobility    Modified Rankin (Stroke Patients Only)       Balance Overall balance assessment:  Needs assistance Sitting-balance support: Feet supported                                        Cognition Arousal/Alertness: Awake/alert Behavior During Therapy: WFL for tasks assessed/performed Overall Cognitive Status: Within Functional Limits for tasks assessed                                 General Comments: Able to follow commands.      Exercises Other Exercises Other Exercises: Seated lean backs, lean back with trunk rotation, lean back with march, x10 with VC's for body mechanics and breathing.  Seated chair yoga with seated back extension, flexion and trunk rotation x10. Performed at EOB.   Other Exercises: Reviewed what to expect for discharge plan and answered any questions pt had about what to expect with PT.    General Comments        Pertinent Vitals/Pain Pain Assessment: 0-10 Pain Score: 9  Pain Location: L LE Pain Intervention(s): Limited activity within patient's tolerance;Monitored during session    Home Living  Prior Function            PT Goals (current goals can now be found in the care plan section) Progress towards PT goals: Progressing toward goals    Frequency    BID      PT Plan Current plan remains appropriate    Co-evaluation              AM-PAC PT "6 Clicks" Daily Activity  Outcome Measure  Difficulty turning over in bed (including adjusting bedclothes, sheets and blankets)?: A Little Difficulty moving from lying on back to sitting on the side of the bed? : A Little Difficulty sitting down on and standing up from a chair with arms (e.g., wheelchair, bedside commode, etc,.)?: A Lot Help needed moving to and from a bed to chair (including a wheelchair)?: A Lot Help needed walking in hospital room?: Total Help needed climbing 3-5 steps with a railing? : Total 6 Click Score: 12    End of Session Equipment Utilized During Treatment: Gait belt Activity Tolerance: No  increased pain Patient left: with call bell/phone within reach;in bed;with bed alarm set Nurse Communication: Mobility status PT Visit Diagnosis: Unsteadiness on feet (R26.81);History of falling (Z91.81);Pain Pain - Right/Left: Left Pain - part of body: Leg;Knee     Time: 1645-1710 PT Time Calculation (min) (ACUTE ONLY): 25 min  Charges:  $Therapeutic Exercise: 23-37 mins                    G Codes:  Functional Assessment Tool Used: AM-PAC 6 Clicks Basic Mobility   Roxanne Gates, PT, DPT   Roxanne Gates 02/05/2018, 5:36 PM

## 2018-02-05 NOTE — Progress Notes (Signed)
Pt unable to void since 1900. Bladder scan done and revealed 574ml. Dr Marcille Blanco paged, no orders made but said to give the pt more time to void on her own. If pt still unable to void, redo bladder scan. Pt made aware and educated. External catheter applied.

## 2018-02-05 NOTE — Care Management Obs Status (Signed)
[facility] NOTIFICATION   Patient Details  Name: Tracy Dickerson MRN: [id number] Date of Birth: 16-May-1941   Medicare Observation Status Notification Given:  Yes    Jolly Mango, RN 02/05/2018, 11:00 AM

## 2018-02-05 NOTE — Progress Notes (Addendum)
Patient is medically stable for D/C to [facility] today. Per [facility] admissions coordinator at [facility] they have received a [facility] SNF authorization number but have not got a call yet from [facility] yet. Claiborne Billings confirmed that patient can still come to [facility] today. RN will call report and arrange EMS for transport. Clinical Education officer, museum (CSW) sent D/C orders to [facility] via [place]. Patient is aware of above. CSW left patient's brother Herbie Baltimore a Advertising account executive. Please reconsult if future social work needs arise. CSW signing off.   Patient's brother Herbie Baltimore called CSW back and was made aware of above.   [employer], LCSW [phone]

## 2018-02-05 NOTE — Progress Notes (Signed)
Called report to [place] at [facility], answered all questions. EMS called for transport.

## 2018-02-13 ENCOUNTER — Inpatient Hospital Stay
Admission: EM | Admit: 2018-02-13 | Discharge: 2018-02-16 | DRG: 684 | Disposition: A | Discharge status: Skilled Nursing Facility | Payer: Medicare Other | Attending: Internal Medicine | Admitting: Internal Medicine

## 2018-02-13 ENCOUNTER — Encounter: Payer: Self-pay | Admitting: Emergency Medicine

## 2018-02-13 ENCOUNTER — Other Ambulatory Visit: Payer: Self-pay

## 2018-02-13 DIAGNOSIS — Z87891 Personal history of nicotine dependence: Secondary | ICD-10-CM | POA: Diagnosis not present

## 2018-02-13 DIAGNOSIS — K219 Gastro-esophageal reflux disease without esophagitis: Secondary | ICD-10-CM | POA: Diagnosis present

## 2018-02-13 DIAGNOSIS — D649 Anemia, unspecified: Secondary | ICD-10-CM | POA: Diagnosis present

## 2018-02-13 DIAGNOSIS — N1330 Unspecified hydronephrosis: Secondary | ICD-10-CM | POA: Diagnosis present

## 2018-02-13 DIAGNOSIS — I519 Heart disease, unspecified: Secondary | ICD-10-CM | POA: Diagnosis present

## 2018-02-13 DIAGNOSIS — N189 Chronic kidney disease, unspecified: Secondary | ICD-10-CM | POA: Diagnosis present

## 2018-02-13 DIAGNOSIS — Z853 Personal history of malignant neoplasm of breast: Secondary | ICD-10-CM | POA: Diagnosis not present

## 2018-02-13 DIAGNOSIS — T40605A Adverse effect of unspecified narcotics, initial encounter: Secondary | ICD-10-CM | POA: Diagnosis present

## 2018-02-13 DIAGNOSIS — I480 Paroxysmal atrial fibrillation: Secondary | ICD-10-CM | POA: Diagnosis present

## 2018-02-13 DIAGNOSIS — X58XXXA Exposure to other specified factors, initial encounter: Secondary | ICD-10-CM | POA: Diagnosis present

## 2018-02-13 DIAGNOSIS — Z8679 Personal history of other diseases of the circulatory system: Secondary | ICD-10-CM

## 2018-02-13 DIAGNOSIS — E119 Type 2 diabetes mellitus without complications: Secondary | ICD-10-CM | POA: Diagnosis present

## 2018-02-13 DIAGNOSIS — Z923 Personal history of irradiation: Secondary | ICD-10-CM | POA: Diagnosis not present

## 2018-02-13 DIAGNOSIS — N179 Acute kidney failure, unspecified: Secondary | ICD-10-CM | POA: Diagnosis not present

## 2018-02-13 DIAGNOSIS — S82302A Unspecified fracture of lower end of left tibia, initial encounter for closed fracture: Secondary | ICD-10-CM | POA: Diagnosis present

## 2018-02-13 DIAGNOSIS — R330 Drug induced retention of urine: Secondary | ICD-10-CM | POA: Diagnosis present

## 2018-02-13 DIAGNOSIS — Z85118 Personal history of other malignant neoplasm of bronchus and lung: Secondary | ICD-10-CM

## 2018-02-13 DIAGNOSIS — N19 Unspecified kidney failure: Secondary | ICD-10-CM

## 2018-02-13 LAB — COMPREHENSIVE METABOLIC PANEL
ALT: 14 U/L (ref 14–54)
AST: 31 U/L (ref 15–41)
Albumin: 3.5 g/dL (ref 3.5–5.0)
Alkaline Phosphatase: 99 U/L (ref 38–126)
Anion gap: 10 (ref 5–15)
BUN: 89 mg/dL — ABNORMAL HIGH (ref 6–20)
CO2: 22 mmol/L (ref 22–32)
Calcium: 8.7 mg/dL — ABNORMAL LOW (ref 8.9–10.3)
Chloride: 103 mmol/L (ref 101–111)
Creatinine, Ser: 3.48 mg/dL — ABNORMAL HIGH (ref 0.44–1.00)
GFR calc Af Amer: 14 mL/min — ABNORMAL LOW (ref >60)
GFR calc non Af Amer: 12 mL/min — ABNORMAL LOW (ref >60)
Glucose, Bld: 109 mg/dL — ABNORMAL HIGH (ref 65–99)
Potassium: 5 mmol/L (ref 3.5–5.1)
Sodium: 135 mmol/L (ref 135–145)
Total Bilirubin: 0.5 mg/dL (ref 0.3–1.2)
Total Protein: 7.8 g/dL (ref 6.5–8.1)

## 2018-02-13 LAB — CBC WITH DIFFERENTIAL/PLATELET
Basophils Absolute: 0.1 10*3/uL (ref 0–0.1)
Basophils Relative: 1 %
Eosinophils Absolute: 0.7 10*3/uL (ref 0–0.7)
Eosinophils Relative: 6 %
HCT: 29.1 % — ABNORMAL LOW (ref 35.0–47.0)
Hemoglobin: 9.1 g/dL — ABNORMAL LOW (ref 12.0–16.0)
Lymphocytes Relative: 13 %
Lymphs Abs: 1.4 10*3/uL (ref 1.0–3.6)
MCH: 24.9 pg — ABNORMAL LOW (ref 26.0–34.0)
MCHC: 31.3 g/dL — ABNORMAL LOW (ref 32.0–36.0)
MCV: 79.6 fL — ABNORMAL LOW (ref 80.0–100.0)
Monocytes Absolute: 0.5 10*3/uL (ref 0.2–0.9)
Monocytes Relative: 4 %
Neutro Abs: 8.4 10*3/uL — ABNORMAL HIGH (ref 1.4–6.5)
Neutrophils Relative %: 76 %
Platelets: 308 10*3/uL (ref 150–440)
RBC: 3.66 MIL/uL — ABNORMAL LOW (ref 3.80–5.20)
RDW: 20.8 % — ABNORMAL HIGH (ref 11.5–14.5)
WBC: 11 10*3/uL (ref 3.6–11.0)

## 2018-02-13 LAB — URINALYSIS, ROUTINE W REFLEX MICROSCOPIC
Bilirubin Urine: NEGATIVE
Glucose, UA: NEGATIVE mg/dL
Hgb urine dipstick: NEGATIVE
Ketones, ur: NEGATIVE mg/dL
Leukocytes, UA: NEGATIVE
Nitrite: NEGATIVE
Protein, ur: NEGATIVE mg/dL
Specific Gravity, Urine: 1.014 (ref 1.005–1.030)
pH: 6 (ref 5.0–8.0)

## 2018-02-13 LAB — PROTIME-INR
INR: 2.25
Prothrombin Time: 24.7 seconds — ABNORMAL HIGH (ref 11.4–15.2)

## 2018-02-13 LAB — GLUCOSE, CAPILLARY: Glucose-Capillary: 86 mg/dL (ref 65–99)

## 2018-02-13 MED ORDER — PRAMIPEXOLE DIHYDROCHLORIDE 0.25 MG PO TABS
0.2500 mg | ORAL_TABLET | Freq: Every day | ORAL | Status: DC
  Administered 2018-02-13 – 2018-02-15 (×3): 0.25 mg via ORAL
  Filled 2018-02-13 (×3): qty 1

## 2018-02-13 MED ORDER — SODIUM CHLORIDE 0.9 % IV SOLN
Freq: Once | INTRAVENOUS | Status: AC
  Administered 2018-02-13: 17:00:00 via INTRAVENOUS

## 2018-02-13 MED ORDER — HEPARIN SODIUM (PORCINE) 5000 UNIT/ML IJ SOLN
5000.0000 [IU] | Freq: Three times a day (TID) | INTRAMUSCULAR | Status: DC
  Administered 2018-02-13 – 2018-02-14 (×3): 5000 [IU] via SUBCUTANEOUS
  Filled 2018-02-13 (×3): qty 1

## 2018-02-13 MED ORDER — POLYETHYLENE GLYCOL 3350 17 G PO PACK
17.0000 g | PACK | Freq: Every day | ORAL | Status: DC | PRN
  Administered 2018-02-16: 10:00:00 17 g via ORAL
  Filled 2018-02-13: qty 1

## 2018-02-13 MED ORDER — WARFARIN SODIUM 2.5 MG PO TABS: 3.7500 mg | ORAL_TABLET | ORAL | Status: DC

## 2018-02-13 MED ORDER — BISACODYL 10 MG RE SUPP
10.0000 mg | Freq: Every day | RECTAL | Status: DC | PRN
  Administered 2018-02-16: 10 mg via RECTAL
  Filled 2018-02-13: qty 1

## 2018-02-13 MED ORDER — HYDROCODONE-ACETAMINOPHEN 5-325 MG PO TABS: 1.0000 | ORAL_TABLET | ORAL | Status: DC | PRN

## 2018-02-13 MED ORDER — WARFARIN - PHYSICIAN DOSING INPATIENT
Freq: Every day | Status: DC
  Administered 2018-02-14: 19:00:00

## 2018-02-13 MED ORDER — FAMOTIDINE IN NACL 20-0.9 MG/50ML-% IV SOLN
20.0000 mg | INTRAVENOUS | Status: DC
  Administered 2018-02-13 – 2018-02-14 (×2): 20 mg via INTRAVENOUS
  Filled 2018-02-13 (×2): qty 50

## 2018-02-13 MED ORDER — ACETAMINOPHEN 325 MG PO TABS: 650.0000 mg | ORAL_TABLET | Freq: Four times a day (QID) | ORAL | Status: DC | PRN

## 2018-02-13 MED ORDER — TIOTROPIUM BROMIDE MONOHYDRATE 18 MCG IN CAPS
1.0000 | ORAL_CAPSULE | Freq: Every day | RESPIRATORY_TRACT | Status: DC
  Administered 2018-02-14 – 2018-02-16 (×3): 18 ug via RESPIRATORY_TRACT
  Filled 2018-02-13: qty 5

## 2018-02-13 MED ORDER — FLEET ENEMA 7-19 GM/118ML RE ENEM: 1.0000 | ENEMA | Freq: Once | RECTAL | Status: DC | PRN

## 2018-02-13 MED ORDER — GABAPENTIN 300 MG PO CAPS
300.0000 mg | ORAL_CAPSULE | Freq: Every day | ORAL | Status: DC
  Administered 2018-02-13 – 2018-02-15 (×3): 300 mg via ORAL
  Filled 2018-02-13 (×3): qty 1

## 2018-02-13 MED ORDER — WARFARIN SODIUM 5 MG PO TABS
5.0000 mg | ORAL_TABLET | ORAL | Status: DC
  Administered 2018-02-13 – 2018-02-15 (×3): 5 mg via ORAL
  Filled 2018-02-13 (×4): qty 1

## 2018-02-13 MED ORDER — ONDANSETRON HCL 4 MG/2ML IJ SOLN: 4.0000 mg | Freq: Four times a day (QID) | INTRAMUSCULAR | Status: DC | PRN

## 2018-02-13 MED ORDER — OXYCODONE HCL 5 MG PO TABS: 5.0000 mg | ORAL_TABLET | ORAL | Status: DC | PRN

## 2018-02-13 MED ORDER — INSULIN ASPART 100 UNIT/ML ~~LOC~~ SOLN
0.0000 [IU] | Freq: Three times a day (TID) | SUBCUTANEOUS | Status: DC
  Administered 2018-02-14: 3 [IU] via SUBCUTANEOUS

## 2018-02-13 MED ORDER — MOMETASONE FURO-FORMOTEROL FUM 200-5 MCG/ACT IN AERO
2.0000 | INHALATION_SPRAY | Freq: Two times a day (BID) | RESPIRATORY_TRACT | Status: DC
  Administered 2018-02-13 – 2018-02-16 (×6): 2 via RESPIRATORY_TRACT
  Filled 2018-02-13: qty 8.8

## 2018-02-13 MED ORDER — ONDANSETRON HCL 4 MG PO TABS: 4.0000 mg | ORAL_TABLET | Freq: Four times a day (QID) | ORAL | Status: DC | PRN

## 2018-02-13 MED ORDER — INSULIN ASPART 100 UNIT/ML ~~LOC~~ SOLN: 0.0000 [IU] | Freq: Every day | SUBCUTANEOUS | Status: DC

## 2018-02-13 MED ORDER — FERROUS SULFATE 325 (65 FE) MG PO TABS
325.0000 mg | ORAL_TABLET | Freq: Every day | ORAL | Status: DC
  Administered 2018-02-14 – 2018-02-16 (×3): 325 mg via ORAL
  Filled 2018-02-13 (×3): qty 1

## 2018-02-13 MED ORDER — SODIUM CHLORIDE 0.9 % IV SOLN
INTRAVENOUS | Status: AC
  Administered 2018-02-13: 22:00:00 via INTRAVENOUS

## 2018-02-13 MED ORDER — ALBUTEROL SULFATE (2.5 MG/3ML) 0.083% IN NEBU: 2.5000 mg | INHALATION_SOLUTION | RESPIRATORY_TRACT | Status: DC | PRN

## 2018-02-13 MED ORDER — ATORVASTATIN CALCIUM 20 MG PO TABS
80.0000 mg | ORAL_TABLET | Freq: Every day | ORAL | Status: DC
  Administered 2018-02-14 – 2018-02-16 (×3): 80 mg via ORAL
  Filled 2018-02-13 (×3): qty 4

## 2018-02-13 MED ORDER — PANTOPRAZOLE SODIUM 40 MG PO TBEC
40.0000 mg | DELAYED_RELEASE_TABLET | Freq: Every day | ORAL | Status: DC
  Administered 2018-02-14 – 2018-02-16 (×3): 40 mg via ORAL
  Filled 2018-02-13 (×3): qty 1

## 2018-02-13 MED ORDER — ACETAMINOPHEN 650 MG RE SUPP: 650.0000 mg | Freq: Four times a day (QID) | RECTAL | Status: DC | PRN

## 2018-02-13 MED ORDER — METOPROLOL TARTRATE 25 MG PO TABS
12.5000 mg | ORAL_TABLET | Freq: Two times a day (BID) | ORAL | Status: DC
  Administered 2018-02-13 – 2018-02-16 (×6): 12.5 mg via ORAL
  Filled 2018-02-13 (×6): qty 1

## 2018-02-13 NOTE — ED Notes (Signed)
First RN note:  Patient here via [facility] from [facility] where they sent her for abnormal lab values in regards to her creatinine.  Facility did not inform EMS of what the level was. Patient in NAD at this time with even and unlabored respirations. Patient there for rehab for fracture.

## 2018-02-13 NOTE — ED Notes (Signed)
Transport to floor room 105.AS

## 2018-02-13 NOTE — Progress Notes (Signed)
Family Meeting Note  Advance Directive:yes  Today a meeting took place with the Patient.  Patient is able to participate   The following clinical team members were present during this meeting:MD  The following were discussed:Patient's diagnosis: Acute kidney injury with urinary retention, tib-fib fracture, diabetes, GERD, heart disease, A. fib, hypertension, morbid obesity, Patient's progosis: Unable to determine and Goals for treatment: Full Code  Additional follow-up to be provided: prn  Time spent during discussion:20 minutes  Gorden Harms, MD

## 2018-02-13 NOTE — ED Triage Notes (Signed)
Pt presents to ED via [facility] from [facility] c/o elevated creatinine level. Pt states she had level checked 2 days ago and was supposed to be rechecked today but tech was unable to get blood so pt was sent to ED. Pt does not know what the abnormal level was.

## 2018-02-13 NOTE — H&P (Signed)
[place] [place] NAME: Tracy Dickerson    MR#:  [id number]  DATE OF BIRTH:  1941/05/04  DATE OF ADMISSION:  02/13/2018  PRIMARY CARE PHYSICIAN: Boykin Nearing, MD   REQUESTING/REFERRING PHYSICIAN:   CHIEF COMPLAINT:   Chief Complaint  Patient presents with  . Abnormal Lab    HISTORY OF PRESENT ILLNESS: Tracy Dickerson  is a 77 y.o. female with a known history per below sent from local inpatient rehab for abnormal lab value/elevated creatinine noted on routine lab testing 2 days ago, in the emergency room work-up noted for a creatinine 3.4 with baseline normal, hemoglobin 9 from 8.3, patient noted to have distended bladder/inability to void, Foley was placed -1800 cc of urine drained off, patient evaluated in the emergency room, no apparent distress, resting comfortably in bed, patient denies any pain, patient is now being admitted for acute kidney injury most likely secondary to multifactorial process which includes acute urinary retention and medication side effect.  PAST MEDICAL HISTORY:   Past Medical History:  Diagnosis Date  . Cancer ([place])    Breast and lung  . Diabetes mellitus without complication ([place])   . GERD (gastroesophageal reflux disease)   . Heart disease   . Hypertension     PAST SURGICAL HISTORY:  Past Surgical History:  Procedure Laterality Date  . ABDOMINAL HYSTERECTOMY    . APPENDECTOMY    . BREAST SURGERY     breast cancer LEFT  . OPEN REDUCTION INTERNAL FIXATION (ORIF) TIBIA/FIBULA FRACTURE Left 02/03/2018   Procedure: OPEN REDUCTION INTERNAL FIXATION (ORIF) DISTAL TIBIA FRACTURE;  Surgeon: Corky Mull, MD;  Location: [facility] ORS;  Service: Orthopedics;  Laterality: Left;  ankle/lower leg  . TONSILLECTOMY      SOCIAL HISTORY:  Social History   Tobacco Use  . Smoking status: Former Research scientist (life sciences)  . Smokeless tobacco: Never Used  Substance Use Topics  . Alcohol use: Yes    FAMILY HISTORY:  Family History   Problem Relation Age of Onset  . Cancer Mother     DRUG ALLERGIES:  Allergies  Allergen Reactions  . Percocet [Oxycodone-Acetaminophen] Itching  . Penicillins Rash    Has patient had a PCN reaction causing immediate rash, facial/tongue/throat swelling, SOB or lightheadedness with hypotension: Yes Has patient had a PCN reaction causing severe rash involving mucus membranes or skin necrosis: Unknown Has patient had a PCN reaction that required hospitalization: Unknown Has patient had a PCN reaction occurring within the last 10 years: No If all of the above answers are "NO", then may proceed with Cephalosporin use.    REVIEW OF SYSTEMS:   CONSTITUTIONAL: No fever, fatigue or weakness.  EYES: No blurred or double vision.  EARS, NOSE, AND THROAT: No tinnitus or ear pain.  RESPIRATORY: No cough, shortness of breath, wheezing or hemoptysis.  CARDIOVASCULAR: No chest pain, orthopnea, edema.  GASTROINTESTINAL: No nausea, vomiting, diarrhea or abdominal pain.  GENITOURINARY: No dysuria, hematuria.  ENDOCRINE: No polyuria, nocturia,  HEMATOLOGY: No anemia, easy bruising or bleeding SKIN: No rash or lesion. MUSCULOSKELETAL: No joint pain or arthritis.   NEUROLOGIC: No tingling, numbness, weakness.  PSYCHIATRY: No anxiety or depression.   MEDICATIONS AT HOME:  Prior to Admission medications   Medication Sig Start Date End Date Taking? Authorizing Provider  ADVAIR DISKUS 250-50 MCG/DOSE AEPB Inhale 1 puff into the lungs 2 (two) times daily. 12/14/16  Yes [provider]  albuterol (PROAIR HFA) 108 (90 Base) MCG/ACT inhaler Inhale 2  puffs into the lungs every 4 (four) hours as needed. 04/08/17  Yes [provider]  atorvastatin (LIPITOR) 80 MG tablet Take 80 mg by mouth daily.   Yes [provider]  Ferrous Sulfate (SLOW FE) 142 (45 Fe) MG TBCR Take 1 tablet by mouth daily.   Yes [provider]  gabapentin (NEURONTIN) 300 MG capsule Take 1 capsule (300 mg  total) by mouth at bedtime. 11/24/17  Yes Nance Pear, MD  lisinopril (PRINIVIL,ZESTRIL) 10 MG tablet Take 1 tablet (10 mg total) by mouth daily. 02/05/18  Yes Bettey Costa, MD  metFORMIN (GLUCOPHAGE) 500 MG tablet Take 1 tablet (500 mg total) by mouth 2 (two) times daily. 02/06/15 02/13/18 Yes Paduchowski, Lennette Bihari, MD  metoprolol tartrate (LOPRESSOR) 25 MG tablet Take 0.5 tablets (12.5 mg total) by mouth 2 (two) times daily. 02/05/18  Yes Mody, Ulice Bold, MD  omeprazole (PRILOSEC) 40 MG capsule Take 40 mg by mouth daily. 01/15/17  Yes [provider]  pramipexole (MIRAPEX) 0.25 MG tablet Take 0.25 mg by mouth at bedtime. 12/23/16  Yes [provider]  SPIRIVA HANDIHALER 18 MCG inhalation capsule Place 1 capsule into inhaler and inhale daily. 01/26/18  Yes [provider]  triamterene-hydrochlorothiazide (MAXZIDE-25) 37.5-25 MG tablet Take 1 tablet by mouth daily.   Yes [provider]  warfarin (COUMADIN) 2.5 MG tablet Take 3.75-5 mg by mouth daily. Take 3.75MG  ON ThursdayS. 5MG  ON Monday,TUESDAY,WEDNESDAY,FRIDAY,SATURDAY,AND SUNDAY 01/21/17  Yes [provider]  benzonatate (TESSALON PERLES) 100 MG capsule Take 1 capsule (100 mg total) by mouth every 6 (six) hours as needed for cough. 01/31/18 01/31/19  Harvest Dark, MD  oxyCODONE (OXY IR/ROXICODONE) 5 MG immediate release tablet Take 1-2 tablets (5-10 mg total) by mouth every 4 (four) hours as needed for moderate pain (pain score 4-6). 02/05/18   Lattie Corns, PA-C      PHYSICAL EXAMINATION:   VITAL SIGNS: Blood pressure (!) 101/30, pulse 83, temperature 97.7 F (36.5 C), temperature source Oral, resp. rate 19, height 5\' 6"  (1.676 m), weight 111.6 kg (246 lb), SpO2 99 %.  GENERAL:  77 y.o.-year-old patient lying in the bed with no acute distress.  Morbidly obese EYES: Pupils equal, round, reactive to light and accommodation. No scleral icterus. Extraocular muscles intact.  HEENT: Head atraumatic,  normocephalic. Oropharynx and nasopharynx clear.  NECK:  Supple, no jugular venous distention. No thyroid enlargement, no tenderness.  LUNGS: Normal breath sounds bilaterally, no wheezing, rales,rhonchi or crepitation. No use of accessory muscles of respiration.  CARDIOVASCULAR: S1, S2 normal. No murmurs, rubs, or gallops.  ABDOMEN: Soft, nontender, nondistended. Bowel sounds present. No organomegaly or mass.  EXTREMITIES: No pedal edema, cyanosis, or clubbing.  Left lower extremity splint NEUROLOGIC: Cranial nerves II through XII are intact. Muscle strength 5/5 in all extremities. Sensation intact. Gait not checked.  PSYCHIATRIC: The patient is alert and oriented x 3.  SKIN: No obvious rash, lesion, or ulcer.   LABORATORY PANEL:   CBC Recent Labs  Lab 02/13/18 1424  WBC 11.0  HGB 9.1*  HCT 29.1*  PLT 308  MCV 79.6*  MCH 24.9*  MCHC 31.3*  RDW 20.8*  LYMPHSABS 1.4  MONOABS 0.5  EOSABS 0.7  BASOSABS 0.1   ------------------------------------------------------------------------------------------------------------------  Chemistries  Recent Labs  Lab 02/13/18 1424  NA 135  K 5.0  CL 103  CO2 22  GLUCOSE 109*  BUN 89*  CREATININE 3.48*  CALCIUM 8.7*  AST 31  ALT 14  ALKPHOS 99  BILITOT 0.5   ------------------------------------------------------------------------------------------------------------------  estimated creatinine clearance is 18 mL/min (A) (by C-G formula based on SCr of 3.48 mg/dL (H)). ------------------------------------------------------------------------------------------------------------------ No results for input(s): TSH, T4TOTAL, T3FREE, THYROIDAB in the last 72 hours.  Invalid input(s): FREET3   Coagulation profile Recent Labs  Lab 02/13/18 1823  INR 2.25   ------------------------------------------------------------------------------------------------------------------- No results for input(s): DDIMER in the last 72  hours. -------------------------------------------------------------------------------------------------------------------  Cardiac Enzymes No results for input(s): CKMB, TROPONINI, MYOGLOBIN in the last 168 hours.  Invalid input(s): CK ------------------------------------------------------------------------------------------------------------------ Invalid input(s): POCBNP  ---------------------------------------------------------------------------------------------------------------  Urinalysis    Component Value Date/Time   COLORURINE YELLOW (A) 02/13/2018 1728   APPEARANCEUR CLEAR (A) 02/13/2018 1728   APPEARANCEUR Hazy 01/07/2015 1812   LABSPEC 1.014 02/13/2018 1728   LABSPEC 1.014 01/07/2015 1812   PHURINE 6.0 02/13/2018 1728   GLUCOSEU NEGATIVE 02/13/2018 1728   GLUCOSEU Negative 01/07/2015 1812   HGBUR NEGATIVE 02/13/2018 1728   BILIRUBINUR NEGATIVE 02/13/2018 1728   BILIRUBINUR Negative 01/07/2015 1812   KETONESUR NEGATIVE 02/13/2018 1728   PROTEINUR NEGATIVE 02/13/2018 1728   NITRITE NEGATIVE 02/13/2018 1728   LEUKOCYTESUR NEGATIVE 02/13/2018 1728   LEUKOCYTESUR Negative 01/07/2015 1812     RADIOLOGY: No results found.  EKG: Orders placed or performed during the hospital encounter of 02/02/18  . ED EKG  . ED EKG  . EKG 12-Lead  . EKG 12-Lead  . EKG    IMPRESSION AND PLAN: *Acute kidney injury Most likely secondary to multifactorial process which includes acute urinary retention and medication side effect Admit to regular nursing floor bed, avoid nephrotoxic agents, hold lisinopril/metformin/Maxide, strict I&O monitoring, daily weights, check renal ultrasound, nephrology to see, BMP in the morning, continue Foley to gravity, continue close medical monitoring  *Acute urinary retention  resolved Continue Foley to gravity  *Chronic diabetes mellitus type 2 Hold metformin, sliding scale insulin with Accu-Cheks per routine, ADA/heart healthy diet for  now  *Chronic GERD without esophagitis  stable PPI daily  *History of paroxysmal A. Fib Stable Continue Lopressor, Coumadin-check PT/INR now, then every a.m.  *Recent acute tib-fib fracture Stable Continue left lower extremity splint, adult pain protocol, will need to be discharged back to inpatient rehab status post discharge with orthopedic follow-up for continued medical management   Disposition back to inpatient rehab in 2 to 3 days barring any complications      All the records are reviewed and case discussed with ED provider. Management plans discussed with the patient, family and they are in agreement.  CODE STATUS:full Code Status History    Date Active Date Inactive Code Status Order ID Comments User Context   02/02/2018 2159 02/03/2018 2049 Full Code [id number]  Corky Mull, MD Inpatient   10/29/2017 1816 10/31/2017 1816 DNR [id number]  Hillary Bow, MD ED   01/26/2017 2250 01/27/2017 1427 Full Code [id number]  Henreitta Leber, MD ED       TOTAL TIME TAKING CARE OF THIS PATIENT: 45 minutes.    Avel Peace Isella Slatten M.D on 02/13/2018   Between 7am to 6pm - Pager - [phone]  After 6pm go to [email or web address] - password [email or web address] [facility]  Office  [phone]  CC: Primary care physician; Boykin Nearing, MD   Note: This dictation was prepared with Dragon dictation along with smaller phrase technology. Any transcriptional errors that result from this process are unintentional.

## 2018-02-13 NOTE — ED Provider Notes (Signed)
[facility] Emergency Department Provider Note  Time seen: 4:16 PM  I have reviewed the triage vital signs and the nursing notes.   HISTORY  Chief Complaint Abnormal Lab    HPI Tracy Dickerson is a 77 y.o. female with a past medical history of diabetes, gastric reflux, hypertension, recent left lower extremity fracture who presents to the emergency department from her nursing facility with decrease kidney function.  According to the patient she had lab work done at her nursing facility and they told her that her kidney function had decreased and sent her to the emergency department.  Patient states she has noted that she has been peeing less frequently but denies any dysuria or hematuria.  Denies any abdominal pain, fullness, nausea vomiting or diarrhea.  Denies chest pain or trouble breathing.  No fever.  Largely asymptomatic otherwise.   Past Medical History:  Diagnosis Date  . Cancer ([place])    Breast and lung  . Diabetes mellitus without complication ([place])   . GERD (gastroesophageal reflux disease)   . Heart disease   . Hypertension     Patient Active Problem List   Diagnosis Date Noted  . Closed extra-articular fracture of distal tibia 02/02/2018  . Pneumonia 10/29/2017  . Hypotension 01/26/2017    Past Surgical History:  Procedure Laterality Date  . ABDOMINAL HYSTERECTOMY    . APPENDECTOMY    . BREAST SURGERY     breast cancer LEFT  . OPEN REDUCTION INTERNAL FIXATION (ORIF) TIBIA/FIBULA FRACTURE Left 02/03/2018   Procedure: OPEN REDUCTION INTERNAL FIXATION (ORIF) DISTAL TIBIA FRACTURE;  Surgeon: Corky Mull, MD;  Location: [facility] ORS;  Service: Orthopedics;  Laterality: Left;  ankle/lower leg  . TONSILLECTOMY      Prior to Admission medications   Medication Sig Start Date End Date Taking? Authorizing Provider  ADVAIR DISKUS 250-50 MCG/DOSE AEPB Inhale 1 puff into the lungs 2 (two) times daily. 12/14/16   [provider]  albuterol  (PROAIR HFA) 108 (90 Base) MCG/ACT inhaler Inhale 2 puffs into the lungs every 4 (four) hours as needed. 04/08/17   [provider]  atorvastatin (LIPITOR) 80 MG tablet Take 80 mg by mouth daily.    [provider]  benzonatate (TESSALON PERLES) 100 MG capsule Take 1 capsule (100 mg total) by mouth every 6 (six) hours as needed for cough. 01/31/18 01/31/19  Harvest Dark, MD  Ferrous Sulfate (SLOW FE) 142 (45 Fe) MG TBCR Take 1 tablet by mouth daily.    [provider]  gabapentin (NEURONTIN) 300 MG capsule Take 1 capsule (300 mg total) by mouth at bedtime. 11/24/17   Nance Pear, MD  lisinopril (PRINIVIL,ZESTRIL) 10 MG tablet Take 1 tablet (10 mg total) by mouth daily. 02/05/18   Bettey Costa, MD  metFORMIN (GLUCOPHAGE) 500 MG tablet Take 1 tablet (500 mg total) by mouth 2 (two) times daily. 02/06/15 02/02/18  Harvest Dark, MD  metoprolol tartrate (LOPRESSOR) 25 MG tablet Take 0.5 tablets (12.5 mg total) by mouth 2 (two) times daily. 02/05/18   Bettey Costa, MD  omeprazole (PRILOSEC) 40 MG capsule Take 40 mg by mouth daily. 01/15/17   [provider]  oxyCODONE (OXY IR/ROXICODONE) 5 MG immediate release tablet Take 1-2 tablets (5-10 mg total) by mouth every 4 (four) hours as needed for moderate pain (pain score 4-6). 02/05/18   Lattie Corns, PA-C  pramipexole (MIRAPEX) 0.25 MG tablet Take 0.25 mg by mouth at bedtime. 12/23/16   [provider]  SPIRIVA HANDIHALER 18 MCG inhalation capsule Place 1 capsule into inhaler and inhale daily. 01/26/18   [provider]  triamterene-hydrochlorothiazide (MAXZIDE-25) 37.5-25 MG tablet Take 1 tablet by mouth daily.    [provider]  warfarin (COUMADIN) 2.5 MG tablet Take 3.75-5 mg by mouth daily. Take 3.75MG  ON ThursdayS. 5MG  ON Monday,TUESDAY,WEDNESDAY,FRIDAY,SATURDAY,AND SUNDAY 01/21/17   [provider]    Allergies  Allergen Reactions  . Percocet [Oxycodone-Acetaminophen]  Itching  . Penicillins Rash    Has patient had a PCN reaction causing immediate rash, facial/tongue/throat swelling, SOB or lightheadedness with hypotension: Yes Has patient had a PCN reaction causing severe rash involving mucus membranes or skin necrosis: Unknown Has patient had a PCN reaction that required hospitalization: Unknown Has patient had a PCN reaction occurring within the last 10 years: No If all of the above answers are "NO", then may proceed with Cephalosporin use.    Family History  Problem Relation Age of Onset  . Cancer Mother     Social History Social History   Tobacco Use  . Smoking status: Former Research scientist (life sciences)  . Smokeless tobacco: Never Used  Substance Use Topics  . Alcohol use: Yes  . Drug use: No    Review of Systems Constitutional: Negative for fever. Eyes: Negative for visual complaints ENT: Negative for recent illness/congestion Cardiovascular: Negative for chest pain. Respiratory: Negative for shortness of breath. Gastrointestinal: Negative for abdominal pain, vomiting and diarrhea. Genitourinary: Decreased urination but no dysuria or hematuria. Musculoskeletal: Recent left leg surgery Skin: Negative for skin complaints  Neurological: Negative for headache All other ROS negative  ____________________________________________   PHYSICAL EXAM:  VITAL SIGNS: ED Triage Vitals  Enc Vitals Group     BP 02/13/18 1407 (!) 98/47     Pulse Rate 02/13/18 1407 71     Resp 02/13/18 1407 16     Temp 02/13/18 1407 97.7 F (36.5 C)     Temp Source 02/13/18 1407 Oral     SpO2 02/13/18 1407 96 %     Weight 02/13/18 1407 246 lb (111.6 kg)     Height 02/13/18 1407 5\' 6"  (1.676 m)     Head Circumference --      Peak Flow --      Pain Score 02/13/18 1409 0     Pain Loc --      Pain Edu? --      Excl. in [place]? --    Constitutional: Alert and oriented. Well appearing and in no distress. Eyes: Normal exam ENT   Head: Normocephalic and atraumatic.    Mouth/Throat: Mucous membranes are moist. Cardiovascular: Normal rate, regular rhythm. No murmur Respiratory: Normal respiratory effort without tachypnea nor retractions. Breath sounds are clear Gastrointestinal: Soft and nontender. No distention.  Obese. Musculoskeletal: Left lower extremity in a short leg cast. Neurologic:  Normal speech and language. No gross focal neurologic deficits Skin:  Skin is warm, dry and intact.  Psychiatric: Mood and affect are normal.   ____________________________________________   INITIAL IMPRESSION / ASSESSMENT AND PLAN / ED COURSE  Pertinent labs & imaging results that were available during my care of the patient were reviewed by me and considered in my medical decision making (see chart for details).  Patient presents emergency department for worsening kidney function sent by her nursing facility.  Patient does not believe she is taking any new medications recently, reviewed the patient's recent discharge summary, does not appear to have any medications that would be of concern for the patient.  We will start IV hydration.  Patient will require admission to the hospital service for further treatment and monitoring.  Patient agreeable to this plan of care.  Significant urine output with Foley catheter insertion greater than 1 L.  Suspect obstructive nephropathy.  We will leave the Foley catheter in place and admit to the hospitalist service.  Patient agreeable to this plan of care.   ____________________________________________   FINAL CLINICAL IMPRESSION(S) / ED DIAGNOSES  Acute renal failure Urinary retention   Harvest Dark, MD 02/13/18 2041

## 2018-02-14 ENCOUNTER — Inpatient Hospital Stay: Payer: Medicare Other

## 2018-02-14 LAB — BASIC METABOLIC PANEL
Anion gap: 6 (ref 5–15)
BUN: 87 mg/dL — ABNORMAL HIGH (ref 6–20)
CO2: 25 mmol/L (ref 22–32)
Calcium: 8.4 mg/dL — ABNORMAL LOW (ref 8.9–10.3)
Chloride: 109 mmol/L (ref 101–111)
Creatinine, Ser: 2.53 mg/dL — ABNORMAL HIGH (ref 0.44–1.00)
GFR calc Af Amer: 20 mL/min — ABNORMAL LOW (ref >60)
GFR calc non Af Amer: 18 mL/min — ABNORMAL LOW (ref >60)
Glucose, Bld: 94 mg/dL (ref 65–99)
Potassium: 4.6 mmol/L (ref 3.5–5.1)
Sodium: 140 mmol/L (ref 135–145)

## 2018-02-14 LAB — GLUCOSE, CAPILLARY
Glucose-Capillary: 102 mg/dL — ABNORMAL HIGH (ref 65–99)
Glucose-Capillary: 161 mg/dL — ABNORMAL HIGH (ref 65–99)
Glucose-Capillary: 111 mg/dL — ABNORMAL HIGH (ref 65–99)
Glucose-Capillary: 121 mg/dL — ABNORMAL HIGH (ref 65–99)

## 2018-02-14 LAB — URINE CULTURE: Culture: NO GROWTH

## 2018-02-14 MED ORDER — TRAMADOL HCL 50 MG PO TABS
50.0000 mg | ORAL_TABLET | Freq: Four times a day (QID) | ORAL | Status: DC | PRN
  Administered 2018-02-14 – 2018-02-16 (×4): 50 mg via ORAL
  Filled 2018-02-14 (×4): qty 1

## 2018-02-14 NOTE — Progress Notes (Signed)
[facility] [place] NAME: Tracy Dickerson    MR#:  [id number]  DATE OF BIRTH:  08/26/1941  SUBJECTIVE:   Came in from rehab after found to have urinary retention and abnormal labs. Patient doing well. REVIEW OF SYSTEMS:   Review of Systems  Constitutional: Negative for chills, fever and weight loss.  HENT: Negative for ear discharge, ear pain and nosebleeds.   Eyes: Negative for blurred vision, pain and discharge.  Respiratory: Negative for sputum production, shortness of breath, wheezing and stridor.   Cardiovascular: Negative for chest pain, palpitations, orthopnea and PND.  Gastrointestinal: Negative for abdominal pain, diarrhea, nausea and vomiting.  Genitourinary: Negative for frequency and urgency.  Musculoskeletal: Negative for back pain and joint pain.  Neurological: Positive for weakness. Negative for sensory change, speech change and focal weakness.  Psychiatric/Behavioral: Negative for depression and hallucinations. The patient is not nervous/anxious.    Tolerating Diet:yes Tolerating PT: pending  DRUG ALLERGIES:   Allergies  Allergen Reactions  . Percocet [Oxycodone-Acetaminophen] Itching  . Penicillins Rash    Has patient had a PCN reaction causing immediate rash, facial/tongue/throat swelling, SOB or lightheadedness with hypotension: Yes Has patient had a PCN reaction causing severe rash involving mucus membranes or skin necrosis: Unknown Has patient had a PCN reaction that required hospitalization: Unknown Has patient had a PCN reaction occurring within the last 10 years: No If all of the above answers are "NO", then may proceed with Cephalosporin use.    VITALS:  Blood pressure (!) 111/47, pulse 79, temperature 98 F (36.7 C), temperature source Oral, resp. rate 19, height 5\' 6"  (1.676 m), weight 110.3 kg (243 lb 2.7 oz), SpO2 96 %.  PHYSICAL EXAMINATION:   Physical Exam  GENERAL:  77 y.o.-year-old patient  lying in the bed with no acute distress. obese EYES: Pupils equal, round, reactive to light and accommodation. No scleral icterus. Extraocular muscles intact.  HEENT: Head atraumatic, normocephalic. Oropharynx and nasopharynx clear.  NECK:  Supple, no jugular venous distention. No thyroid enlargement, no tenderness.  LUNGS: Normal breath sounds bilaterally, no wheezing, rales, rhonchi. No use of accessory muscles of respiration.  CARDIOVASCULAR: S1, S2 normal. No murmurs, rubs, or gallops.  ABDOMEN: Soft, nontender, nondistended. Bowel sounds present. No organomegaly or mass. Foley ++ EXTREMITIES: No cyanosis, clubbing or edema b/l.    NEUROLOGIC: Cranial nerves II through XII are intact. No focal Motor or sensory deficits b/l.   PSYCHIATRIC:  patient is alert and oriented x 3.  SKIN: No obvious rash, lesion, or ulcer.   LABORATORY PANEL:  CBC Recent Labs  Lab 02/13/18 1424  WBC 11.0  HGB 9.1*  HCT 29.1*  PLT 308    Chemistries  Recent Labs  Lab 02/13/18 1424 02/14/18 0515  NA 135 140  K 5.0 4.6  CL 103 109  CO2 22 25  GLUCOSE 109* 94  BUN 89* 87*  CREATININE 3.48* 2.53*  CALCIUM 8.7* 8.4*  AST 31  --   ALT 14  --   ALKPHOS 99  --   BILITOT 0.5  --    Cardiac Enzymes No results for input(s): TROPONINI in the last 168 hours. RADIOLOGY:  US Renal  Result Date: 02/14/2018 CLINICAL DATA:  Acute renal failure, elevated creatinine. EXAM: RENAL / URINARY TRACT ULTRASOUND COMPLETE COMPARISON:  None. FINDINGS: Right Kidney: Length: 11.9 cm. Normal echogenicity. 2.9 by 3.0 by 3.2 cm simple appearing right kidney upper pole cyst. 4.8 by 5.3 by 4.7 cm  simple appearing right kidney lower pole cyst. Mild right hydronephrosis. No mass identified. Left Kidney: Length: 11.1 cm. Echogenicity within normal limits. No mass observed. Mild left hydronephrosis. Bladder: Foley catheter is present in the urinary bladder. The bladder measures approximately 9.7 by 5.0 by 9.2 cm (volume = 230  cm^3). IMPRESSION: 1. Bilateral mild hydronephrosis. There is also moderate residual urine in the urinary bladder, volume 230 cubic cm, despite the presence of the Foley catheter. If the Foley catheter is not clamped, this could raise the possibility of a catheter malfunction or blockage. 2. Two simple appearing right renal cysts. Electronically Signed   By: Van Clines M.D.   On: 02/14/2018 10:23   ASSESSMENT AND PLAN:    Tracy Dickerson  is a 77 y.o. female with a known history per below sent from local inpatient rehab for abnormal lab value/elevated creatinine noted on routine lab testing 2 days ago, in the emergency room work-up noted for a creatinine 3.4 with baseline normal, hemoglobin 9 from 8.3, patient noted to have distended bladder/inability to void, Foley was placed -1800 cc of urine drained off  *Acute kidney injury Most likely secondary toacute urinary retention and medication side effect--narcotics -patient is good urine output creatinine trending down. Ultrasound shows bilateral mild hydronephrosis. -Will try to bladder train by clamping and on clamping catheter seafarer able to remove it tomorrow -creatinine 3.40--- 2.53--- BMP tomorrow  *Acute urinary retention  resolved Continue Foley to gravity--- bladder training today to remove catheter tomorrow  *Chronic diabetes mellitus type 2 Hold metformin, sliding scale insulin with Accu-Cheks per routine, ADA/heart healthy diet for now  *Chronic GERD without esophagitis  PPI daily  *History of paroxysmal A. Fib Continue Lopressor, Coumadin-check PT/INR now, then every a.m.  *Recent acute tib-fib fracture Continue left lower extremity splint, adult pain protocol, will need to be discharged back to inpatient rehab status post discharge with orthopedic follow-up for continued medical management    Case discussed with Care Management/Social Worker. Management plans discussed with the patient, family and they are in  agreement.  CODE STATUS: full  DVT Prophylaxis: heparin  TOTAL TIME TAKING CARE OF THIS PATIENT: *30 minutes.  >50% time spent on counselling and coordination of care  POSSIBLE D/C IN *1-2* DAYS, DEPENDING ON CLINICAL CONDITION.  Note: This dictation was prepared with Dragon dictation along with smaller phrase technology. Any transcriptional errors that result from this process are unintentional.  [name] M.D on 02/14/2018 at 12:53 PM  Between 7am to 6pm - Pager - [phone]  After 6pm go to [email or web address] - password [email or web address] [facility]  Office  [phone]  CC: Primary care physician; Boykin Nearing, MDPatient ID: Tracy Dickerson, female   DOB: 1940-10-09, 77 y.o.   MRN: [id number]

## 2018-02-14 NOTE — Clinical Social Work Note (Signed)
CSW attempted to see patient for discharge planning. Patient was sleeping soundly. Tracy Dickerson at [facility] has confirmed that the patient can return when stable. CSW is following.  Santiago Bumpers, MSW, Latanya Presser [phone]

## 2018-02-14 NOTE — Consult Note (Signed)
Date: 02/14/2018                  Patient Name:  Tracy Dickerson  MRN: [id number]  DOB: [birth year]  Age / Sex: 77 y.o., female         PCP: Boykin Nearing, MD                 Service Requesting Consult: IM/ [name], MD                 Reason for Consult: Consult            History of Present Illness: Patient is a 77 y.o. female with medical problems of A Fib, Recent ORIF for left Tib-Fib fracture, who was admitted to [facility] on 02/13/2018 for evaluation of elevated creatinine.  Patient has a baseline creatinine of 0.64 Admission creatinine was 3.48 Patient is currently resident at [facility] for postop recovery Catheter was placed in the emergency room which showed output greater than 1 L She was admitted for further evaluation and management  Serum creatinine has improved down to 2.53 Urine output is over 2500 cc yesterday and today Foley bag is also full with urine    Medications: Outpatient medications: Medications Prior to Admission  Medication Sig Dispense Refill Last Dose  . ADVAIR DISKUS 250-50 MCG/DOSE AEPB Inhale 1 puff into the lungs 2 (two) times daily.   02/13/2018 at Unknown time  . albuterol (PROAIR HFA) 108 (90 Base) MCG/ACT inhaler Inhale 2 puffs into the lungs every 4 (four) hours as needed.   PRN at PRN  . atorvastatin (LIPITOR) 80 MG tablet Take 80 mg by mouth daily.   02/12/2018 at Unknown time  . Ferrous Sulfate (SLOW FE) 142 (45 Fe) MG TBCR Take 1 tablet by mouth daily.   02/13/2018 at Unknown time  . gabapentin (NEURONTIN) 300 MG capsule Take 1 capsule (300 mg total) by mouth at bedtime. 30 capsule 0 02/12/2018 at Unknown time  . lisinopril (PRINIVIL,ZESTRIL) 10 MG tablet Take 1 tablet (10 mg total) by mouth daily.   02/13/2018 at Unknown time  . metFORMIN (GLUCOPHAGE) 500 MG tablet Take 1 tablet (500 mg total) by mouth 2 (two) times daily. 60 tablet 1 02/13/2018 at Unknown time  . metoprolol tartrate (LOPRESSOR) 25 MG tablet Take 0.5 tablets  (12.5 mg total) by mouth 2 (two) times daily.   02/13/2018 at Unknown time  . omeprazole (PRILOSEC) 40 MG capsule Take 40 mg by mouth daily.   02/13/2018 at Unknown time  . pramipexole (MIRAPEX) 0.25 MG tablet Take 0.25 mg by mouth at bedtime.   02/12/2018 at Unknown time  . SPIRIVA HANDIHALER 18 MCG inhalation capsule Place 1 capsule into inhaler and inhale daily.   02/13/2018 at Unknown time  . triamterene-hydrochlorothiazide (MAXZIDE-25) 37.5-25 MG tablet Take 1 tablet by mouth daily.   02/13/2018 at Unknown time  . warfarin (COUMADIN) 2.5 MG tablet Take 3.75-5 mg by mouth daily. Take 3.75MG  ON ThursdayS. 5MG  ON Monday,TUESDAY,WEDNESDAY,FRIDAY,SATURDAY,AND SUNDAY  0 02/12/2018 at Unknown time  . benzonatate (TESSALON PERLES) 100 MG capsule Take 1 capsule (100 mg total) by mouth every 6 (six) hours as needed for cough. 30 capsule 0 PRN` at PRN  . oxyCODONE (OXY IR/ROXICODONE) 5 MG immediate release tablet Take 1-2 tablets (5-10 mg total) by mouth every 4 (four) hours as needed for moderate pain (pain score 4-6). 60 tablet 0 PRN at PRN    Current medications: Current Facility-Administered Medications  Medication  Dose Route Frequency Provider Last Rate Last Dose  . 0.9 %  sodium chloride infusion   Intravenous Continuous Salary, Holly Bodily D, MD 75 mL/hr at 02/13/18 2144    . acetaminophen (TYLENOL) tablet 650 mg  650 mg Oral Q6H PRN Salary, Montell D, MD       Or  . acetaminophen (TYLENOL) suppository 650 mg  650 mg Rectal Q6H PRN Salary, Montell D, MD      . albuterol (PROVENTIL) (2.5 MG/3ML) 0.083% nebulizer solution 2.5 mg  2.5 mg Inhalation Q4H PRN Salary, Montell D, MD      . atorvastatin (LIPITOR) tablet 80 mg  80 mg Oral Daily Salary, Montell D, MD      . bisacodyl (DULCOLAX) suppository 10 mg  10 mg Rectal Daily PRN Salary, Montell D, MD      . famotidine (PEPCID) IVPB 20 mg premix  20 mg Intravenous Q24H Gorden Harms, MD   Stopped at 02/13/18 2214  . ferrous sulfate tablet 325 mg  325 mg  Oral Q breakfast Salary, Montell D, MD      . gabapentin (NEURONTIN) capsule 300 mg  300 mg Oral QHS Salary, Montell D, MD   300 mg at 02/13/18 2145  . heparin injection 5,000 Units  5,000 Units Subcutaneous Q8H Salary, Holly Bodily D, MD   5,000 Units at 02/14/18 0704  . HYDROcodone-acetaminophen (NORCO/VICODIN) 5-325 MG per tablet 1-2 tablet  1-2 tablet Oral Q4H PRN Salary, Montell D, MD      . insulin aspart (novoLOG) injection 0-15 Units  0-15 Units Subcutaneous TID WC Salary, Montell D, MD      . insulin aspart (novoLOG) injection 0-5 Units  0-5 Units Subcutaneous QHS Salary, Montell D, MD      . metoprolol tartrate (LOPRESSOR) tablet 12.5 mg  12.5 mg Oral BID Salary, Montell D, MD   12.5 mg at 02/13/18 2148  . mometasone-formoterol (DULERA) 200-5 MCG/ACT inhaler 2 puff  2 puff Inhalation BID Loney Hering D, MD   2 puff at 02/13/18 2145  . ondansetron (ZOFRAN) tablet 4 mg  4 mg Oral Q6H PRN Salary, Montell D, MD       Or  . ondansetron (ZOFRAN) injection 4 mg  4 mg Intravenous Q6H PRN Salary, Montell D, MD      . oxyCODONE (Oxy IR/ROXICODONE) immediate release tablet 5-10 mg  5-10 mg Oral Q4H PRN Salary, Montell D, MD      . pantoprazole (PROTONIX) EC tablet 40 mg  40 mg Oral Daily Salary, Montell D, MD      . polyethylene glycol (MIRALAX / GLYCOLAX) packet 17 g  17 g Oral Daily PRN Salary, Montell D, MD      . pramipexole (MIRAPEX) tablet 0.25 mg  0.25 mg Oral QHS Salary, Montell D, MD   0.25 mg at 02/13/18 2145  . sodium phosphate (FLEET) 7-19 GM/118ML enema 1 enema  1 enema Rectal Once PRN Salary, Montell D, MD      . tiotropium (SPIRIVA) inhalation capsule 18 mcg  1 capsule Inhalation Daily Salary, Montell D, MD      . Derrill Memo ON 02/19/2018] warfarin (COUMADIN) tablet 3.75 mg  3.75 mg Oral Once per day on Thu Salary, Montell D, MD      . warfarin (COUMADIN) tablet 5 mg  5 mg Oral Once per day on Sun Mon Tue Wed Fri Sat Salary, Holly Bodily D, MD   5 mg at 02/13/18 2145  . Warfarin - Physician  Dosing Inpatient   Does  not apply q1800 Salary, Avel Peace, MD          Allergies: Allergies  Allergen Reactions  . Percocet [Oxycodone-Acetaminophen] Itching  . Penicillins Rash    Has patient had a PCN reaction causing immediate rash, facial/tongue/throat swelling, SOB or lightheadedness with hypotension: Yes Has patient had a PCN reaction causing severe rash involving mucus membranes or skin necrosis: Unknown Has patient had a PCN reaction that required hospitalization: Unknown Has patient had a PCN reaction occurring within the last 10 years: No If all of the above answers are "NO", then may proceed with Cephalosporin use.      Past Medical History: Past Medical History:  Diagnosis Date  . Cancer ([place])    Breast and lung  . Diabetes mellitus without complication ([place])   . GERD (gastroesophageal reflux disease)   . Heart disease   . Hypertension      Past Surgical History: Past Surgical History:  Procedure Laterality Date  . ABDOMINAL HYSTERECTOMY    . APPENDECTOMY    . BREAST SURGERY     breast cancer LEFT  . OPEN REDUCTION INTERNAL FIXATION (ORIF) TIBIA/FIBULA FRACTURE Left 02/03/2018   Procedure: OPEN REDUCTION INTERNAL FIXATION (ORIF) DISTAL TIBIA FRACTURE;  Surgeon: Corky Mull, MD;  Location: [facility] ORS;  Service: Orthopedics;  Laterality: Left;  ankle/lower leg  . TONSILLECTOMY       Family History: Family History  Problem Relation Age of Onset  . Cancer Mother      Social History: Social History   Socioeconomic History  . Marital status: Widowed    Spouse name: Not on file  . Number of children: Not on file  . Years of education: Not on file  . Highest education level: Not on file  Occupational History  . Not on file  Social Needs  . Financial resource strain: Not on file  . Food insecurity:    Worry: Not on file    Inability: Not on file  . Transportation needs:    Medical: Not on file    Non-medical: Not on file  Tobacco Use  . Smoking  status: Former Research scientist (life sciences)  . Smokeless tobacco: Never Used  Substance and Sexual Activity  . Alcohol use: Yes  . Drug use: No  . Sexual activity: Not on file  Lifestyle  . Physical activity:    Days per week: Not on file    Minutes per session: Not on file  . Stress: Not on file  Relationships  . Social connections:    Talks on phone: Not on file    Gets together: Not on file    Attends religious service: Not on file    Active member of club or organization: Not on file    Attends meetings of clubs or organizations: Not on file    Relationship status: Not on file  . Intimate partner violence:    Fear of current or ex partner: Not on file    Emotionally abused: Not on file    Physically abused: Not on file    Forced sexual activity: Not on file  Other Topics Concern  . Not on file  Social History Narrative  . Not on file     Review of Systems: Gen: Denies any fevers or chills HEENT: No vision or hearing problems CV: No chest pain or shortness of breath Resp: No cough or sputum production GI: Appetite is good, no nausea or vomiting GU : No previous history of any kidney problems MS: Recent  left tibia/fibular fracture surgery Derm:  No complaints Psych: No complaints Heme: No complaints Neuro: No complaints Endocrine No complaints  Vital Signs: Blood pressure 103/62, pulse 82, temperature 98 F (36.7 C), temperature source Oral, resp. rate 19, height 5\' 6"  (1.676 m), weight 110.3 kg (243 lb 2.7 oz), SpO2 96 %.   Intake/Output Summary (Last 24 hours) at 02/14/2018 0953 Last data filed at 02/14/2018 5009 Gross per 24 hour  Intake 472.5 ml  Output 2550 ml  Net -2077.5 ml    Weight trends: Filed Weights   02/13/18 1407 02/14/18 0440  Weight: 111.6 kg (246 lb) 110.3 kg (243 lb 2.7 oz)    Physical Exam: General:  Laying in the bed, no acute distress  HEENT  anicteric, moist oral mucous membranes   Neck:  supple  Lungs:  Normal breathing effort, clear to  auscultation  Heart::  No rub or gallop  Abdomen:  Soft, nontender  Extremities:  Left leg bandaged  Neurologic:  Alert, oriented  Skin:  No acute rashes, normal turgor     Foley:  In place       Lab results: Basic Metabolic Panel: Recent Labs  Lab 02/13/18 1424 02/14/18 0515  NA 135 140  K 5.0 4.6  CL 103 109  CO2 22 25  GLUCOSE 109* 94  BUN 89* 87*  CREATININE 3.48* 2.53*  CALCIUM 8.7* 8.4*    Liver Function Tests: Recent Labs  Lab 02/13/18 1424  AST 31  ALT 14  ALKPHOS 99  BILITOT 0.5  PROT 7.8  ALBUMIN 3.5   No results for input(s): LIPASE, AMYLASE in the last 168 hours. No results for input(s): AMMONIA in the last 168 hours.  CBC: Recent Labs  Lab 02/13/18 1424  WBC 11.0  NEUTROABS 8.4*  HGB 9.1*  HCT 29.1*  MCV 79.6*  PLT 308    Cardiac Enzymes: No results for input(s): CKTOTAL, TROPONINI in the last 168 hours.  BNP: Invalid input(s): POCBNP  CBG: Recent Labs  Lab 02/13/18 2130 02/14/18 0751  GLUCAP 86 102*    Microbiology: Recent Results (from the past 720 hour(s))  Surgical pcr screen     Status: None   Collection Time: 02/02/18 10:28 PM  Result Value Ref Range Status   MRSA, PCR NEGATIVE NEGATIVE Final   Staphylococcus aureus NEGATIVE NEGATIVE Final    Comment: (NOTE) The Xpert SA Assay (FDA approved for NASAL specimens in patients 75 years of age and older), is one component of a comprehensive surveillance program. It is not intended to diagnose infection nor to guide or monitor treatment. Performed at [facility], [address] [place], [place] [address]      Coagulation Studies: Recent Labs    02/13/18 1823  LABPROT 24.7*  INR 2.25    Urinalysis: Recent Labs    02/13/18 1728  COLORURINE YELLOW*  LABSPEC 1.014  PHURINE 6.0  GLUCOSEU NEGATIVE  HGBUR NEGATIVE  BILIRUBINUR NEGATIVE  KETONESUR NEGATIVE  PROTEINUR NEGATIVE  NITRITE NEGATIVE  LEUKOCYTESUR NEGATIVE        Imaging:  No  results found.   Assessment & Plan: Pt is a 77 y.o. Caucasian  female with a PMHX of breast cancer treated with radiation, lung cancer, diabetes, GERD, atrial fibrillation, recent ORIF for Tib/Fib fracture, was admitted on 02/13/2018 with ARF  Acute renal failure is felt to be secondary to acute urinary retention Opioids may be contributing to urinary retention Try to avoid opioids May attempt voiding trial in  Near future  LOS: [place] 5/25/20199:53 AM  [place], [place]  Note: This note was prepared with Dragon dictation. Any transcription errors are unintentional

## 2018-02-15 LAB — GLUCOSE, CAPILLARY
Glucose-Capillary: 96 mg/dL (ref 65–99)
Glucose-Capillary: 105 mg/dL — ABNORMAL HIGH (ref 65–99)
Glucose-Capillary: 96 mg/dL (ref 65–99)
Glucose-Capillary: 155 mg/dL — ABNORMAL HIGH (ref 65–99)

## 2018-02-15 LAB — BASIC METABOLIC PANEL
Anion gap: 6 (ref 5–15)
BUN: 48 mg/dL — ABNORMAL HIGH (ref 6–20)
CO2: 27 mmol/L (ref 22–32)
Calcium: 8.8 mg/dL — ABNORMAL LOW (ref 8.9–10.3)
Chloride: 104 mmol/L (ref 101–111)
Creatinine, Ser: 1.51 mg/dL — ABNORMAL HIGH (ref 0.44–1.00)
GFR calc Af Amer: 38 mL/min — ABNORMAL LOW (ref >60)
GFR calc non Af Amer: 33 mL/min — ABNORMAL LOW (ref >60)
Glucose, Bld: 149 mg/dL — ABNORMAL HIGH (ref 65–99)
Potassium: 3.9 mmol/L (ref 3.5–5.1)
Sodium: 137 mmol/L (ref 135–145)

## 2018-02-15 NOTE — Progress Notes (Signed)
[facility] NAME: Tracy Dickerson    MR#:  [id number]  DATE OF BIRTH:  1941-05-19  SUBJECTIVE:   Came in from rehab after found to have urinary retention and abnormal labs. Patient doing well. REVIEW OF SYSTEMS:   Review of Systems  Constitutional: Negative for chills, fever and weight loss.  HENT: Negative for ear discharge, ear pain and nosebleeds.   Eyes: Negative for blurred vision, pain and discharge.  Respiratory: Negative for sputum production, shortness of breath, wheezing and stridor.   Cardiovascular: Negative for chest pain, palpitations, orthopnea and PND.  Gastrointestinal: Negative for abdominal pain, diarrhea, nausea and vomiting.  Genitourinary: Negative for frequency and urgency.  Musculoskeletal: Negative for back pain and joint pain.  Neurological: Positive for weakness. Negative for sensory change, speech change and focal weakness.  Psychiatric/Behavioral: Negative for depression and hallucinations. The patient is not nervous/anxious.    Tolerating Diet:yes Tolerating PT: pending  DRUG ALLERGIES:   Allergies  Allergen Reactions  . Percocet [Oxycodone-Acetaminophen] Itching  . Penicillins Rash    Has patient had a PCN reaction causing immediate rash, facial/tongue/throat swelling, SOB or lightheadedness with hypotension: Yes Has patient had a PCN reaction causing severe rash involving mucus membranes or skin necrosis: Unknown Has patient had a PCN reaction that required hospitalization: Unknown Has patient had a PCN reaction occurring within the last 10 years: No If all of the above answers are "NO", then may proceed with Cephalosporin use.    VITALS:  Blood pressure 113/64, pulse 70, temperature 97.7 F (36.5 C), temperature source Oral, resp. rate 20, height 5\' 6"  (1.676 m), weight 111.5 kg (245 lb 13 oz), SpO2 98 %.  PHYSICAL EXAMINATION:   Physical Exam  GENERAL:  77 y.o.-year-old patient  lying in the bed with no acute distress. obese EYES: Pupils equal, round, reactive to light and accommodation. No scleral icterus. Extraocular muscles intact.  HEENT: Head atraumatic, normocephalic. Oropharynx and nasopharynx clear.  NECK:  Supple, no jugular venous distention. No thyroid enlargement, no tenderness.  LUNGS: Normal breath sounds bilaterally, no wheezing, rales, rhonchi. No use of accessory muscles of respiration.  CARDIOVASCULAR: S1, S2 normal. No murmurs, rubs, or gallops.  ABDOMEN: Soft, nontender, nondistended. Bowel sounds present. No organomegaly or mass. Foley ++ EXTREMITIES: No cyanosis, clubbing or edema b/l.    NEUROLOGIC: Cranial nerves II through XII are intact. No focal Motor or sensory deficits b/l.   PSYCHIATRIC:  patient is alert and oriented x 3.  SKIN: No obvious rash, lesion, or ulcer.   LABORATORY PANEL:  CBC Recent Labs  Lab 02/13/18 1424  WBC 11.0  HGB 9.1*  HCT 29.1*  PLT 308    Chemistries  Recent Labs  Lab 02/13/18 1424  02/15/18 0929  NA 135   < > 137  K 5.0   < > 3.9  CL 103   < > 104  CO2 22   < > 27  GLUCOSE 109*   < > 149*  BUN 89*   < > 48*  CREATININE 3.48*   < > 1.51*  CALCIUM 8.7*   < > 8.8*  AST 31  --   --   ALT 14  --   --   ALKPHOS 99  --   --   BILITOT 0.5  --   --    < > = values in this interval not displayed.   Cardiac Enzymes No results for input(s): TROPONINI in the last  168 hours. RADIOLOGY:  US Renal  Result Date: 02/14/2018 CLINICAL DATA:  Acute renal failure, elevated creatinine. EXAM: RENAL / URINARY TRACT ULTRASOUND COMPLETE COMPARISON:  None. FINDINGS: Right Kidney: Length: 11.9 cm. Normal echogenicity. 2.9 by 3.0 by 3.2 cm simple appearing right kidney upper pole cyst. 4.8 by 5.3 by 4.7 cm simple appearing right kidney lower pole cyst. Mild right hydronephrosis. No mass identified. Left Kidney: Length: 11.1 cm. Echogenicity within normal limits. No mass observed. Mild left hydronephrosis. Bladder: Foley  catheter is present in the urinary bladder. The bladder measures approximately 9.7 by 5.0 by 9.2 cm (volume = 230 cm^3). IMPRESSION: 1. Bilateral mild hydronephrosis. There is also moderate residual urine in the urinary bladder, volume 230 cubic cm, despite the presence of the Foley catheter. If the Foley catheter is not clamped, this could raise the possibility of a catheter malfunction or blockage. 2. Two simple appearing right renal cysts. Electronically Signed   By: Van Clines M.D.   On: 02/14/2018 10:23   ASSESSMENT AND PLAN:    Tracy Dickerson  is a 77 y.o. female with a known history per below sent from local inpatient rehab for abnormal lab value/elevated creatinine noted on routine lab testing 2 days ago, in the emergency room work-up noted for a creatinine 3.4 with baseline normal, hemoglobin 9 from 8.3, patient noted to have distended bladder/inability to void, Foley was placed -1800 cc of urine drained off  *Acute kidney injury Most likely secondary toacute urinary retention and medication side effect--?narcotics -patient is good urine output creatinine trending down. - Ultrasound shows bilateral mild hydronephrosis. -d/c foely -creatinine 3.40--- 2.53---1.5  *Acute urinary retention  resolved Continue Foley to gravity--- bladder training to remove today  *Chronic diabetes mellitus type 2 Hold metformin, sliding scale insulin with Accu-Cheks per routine, ADA/heart healthy diet for now  *Chronic GERD without esophagitis  PPI daily  *History of paroxysmal A. Fib Continue Lopressor, Coumadin-check PT/INR now, then every a.m.  *Recent acute tib-fib fracture Continue left lower extremity splint, adult pain protocol, will need to be discharged back to inpatient rehab status post discharge with orthopedic follow-up for continued medical management  To rehab tomorrow if remains stable  Case discussed with Care Management/Social Worker. Management plans discussed with the  patient, family and they are in agreement.  CODE STATUS: full  DVT Prophylaxis: heparin  TOTAL TIME TAKING CARE OF THIS PATIENT: *30 minutes.  >50% time spent on counselling and coordination of care  POSSIBLE D/C IN *1-2* DAYS, DEPENDING ON CLINICAL CONDITION.  Note: This dictation was prepared with Dragon dictation along with smaller phrase technology. Any transcriptional errors that result from this process are unintentional.  [name] M.D on 02/15/2018 at 12:37 PM  Between 7am to 6pm - Pager - [phone]  After 6pm go to [email or web address] - password [email or web address] [facility]  Office  [phone]  CC: Primary care physician; Boykin Nearing, MDPatient ID: Tracy Dickerson, female   DOB: [birth year], 77 y.o.   MRN: [id number]

## 2018-02-15 NOTE — Progress Notes (Signed)
[facility] for warfarin dosing  Indication: atrial fibrillation  Allergies  Allergen Reactions  . Percocet [Oxycodone-Acetaminophen] Itching  . Penicillins Rash    Has patient had a PCN reaction causing immediate rash, facial/tongue/throat swelling, SOB or lightheadedness with hypotension: Yes Has patient had a PCN reaction causing severe rash involving mucus membranes or skin necrosis: Unknown Has patient had a PCN reaction that required hospitalization: Unknown Has patient had a PCN reaction occurring within the last 10 years: No If all of the above answers are "NO", then may proceed with Cephalosporin use.    Patient Measurements: Height: 5\' 6"  (167.6 cm) Weight: 245 lb 13 oz (111.5 kg) IBW/kg (Calculated) : 59.3  Vital Signs: Temp: 97.7 F (36.5 C) (05/26 1202) Temp Source: Oral (05/26 1202) BP: 113/64 (05/26 1202) Pulse Rate: 70 (05/26 1202)  Labs: Recent Labs    02/13/18 1424 02/13/18 1823 02/14/18 0515 02/15/18 0929  HGB 9.1*  --   --   --   HCT 29.1*  --   --   --   PLT 308  --   --   --   LABPROT  --  24.7*  --   --   INR  --  2.25  --   --   CREATININE 3.48*  --  2.53* 1.51*    Estimated Creatinine Clearance: 41.4 mL/min (A) (by C-G formula based on SCr of 1.51 mg/dL (H)).   Medications:  Scheduled:  . atorvastatin  80 mg Oral Daily  . ferrous sulfate  325 mg Oral Q breakfast  . gabapentin  300 mg Oral QHS  . insulin aspart  0-15 Units Subcutaneous TID WC  . insulin aspart  0-5 Units Subcutaneous QHS  . metoprolol tartrate  12.5 mg Oral BID  . mometasone-formoterol  2 puff Inhalation BID  . pantoprazole  40 mg Oral Daily  . pramipexole  0.25 mg Oral QHS  . tiotropium  1 capsule Inhalation Daily  . [START ON 02/19/2018] warfarin  3.75 mg Oral Once per day on Thu  . warfarin  5 mg Oral Once per day on Sun Mon Tue Wed Fri Sat  . Warfarin - Physician Dosing Inpatient   Does not apply q1800   Infusions:     Assessment: Pharmacy consulted for warfarin management for 77 yo female admitted with AKI on warfarin for atrial fibrillation, goal INR 2-3. Patient takes warfarin 3.75mg  on Thursdays and 5 mg all other days.   Goal of Therapy:  INR 2-3 Monitor platelets by anticoagulation protocol: Yes   Plan:  Will continue patient on home regimen. Will check INRs daily while admitted.   Pharmacy will continue to monitor and adjust per consult.    Gaige Fussner L 02/15/2018,1:39 PM

## 2018-02-15 NOTE — Progress Notes (Signed)
[facility], [place] 02/15/18  Subjective:   Patient is doing much better today Serum creatinine improved to 1.51 Urine output greater than 3800 cc Patient is able to eat without any nausea or vomiting No shortness of breath  Objective:  Vital signs in last 24 hours:  Temp:  [97.5 F (36.4 C)-98.1 F (36.7 C)] 97.5 F (36.4 C) (05/26 0423) Pulse Rate:  [73-89] 73 (05/26 0802) Resp:  [18-19] 18 (05/26 0423) BP: (101-123)/(47-59) 123/59 (05/26 0802) SpO2:  [96 %-99 %] 99 % (05/26 0802) Weight:  [111.5 kg (245 lb 13 oz)] 111.5 kg (245 lb 13 oz) (05/26 0423)  Weight change: -0.085 kg (-3 oz) Filed Weights   02/13/18 1407 02/14/18 0440 02/15/18 0423  Weight: 111.6 kg (246 lb) 110.3 kg (243 lb 2.7 oz) 111.5 kg (245 lb 13 oz)    Intake/Output:    Intake/Output Summary (Last 24 hours) at 02/15/2018 1028 Last data filed at 02/15/2018 0956 Gross per 24 hour  Intake 480 ml  Output 4050 ml  Net -3570 ml     Physical Exam: General:  Laying in the bed, no acute distress  HEENT  moist oral mucous membranes  Neck  supple  Pulm/lungs  lungs are clear to auscultation  CVS/Heart  regular  Abdomen:   Soft, nontender  Extremities:  Trace edema  Neurologic:  Alert, oriented  Skin:  No acute rashes          Basic Metabolic Panel:  Recent Labs  Lab 02/13/18 1424 02/14/18 0515 02/15/18 0929  NA 135 140 137  K 5.0 4.6 3.9  CL 103 109 104  CO2 22 25 27   GLUCOSE 109* 94 149*  BUN 89* 87* 48*  CREATININE 3.48* 2.53* 1.51*  CALCIUM 8.7* 8.4* 8.8*     CBC: Recent Labs  Lab 02/13/18 1424  WBC 11.0  NEUTROABS 8.4*  HGB 9.1*  HCT 29.1*  MCV 79.6*  PLT 308     No results found for: HEPBSAG, HEPBSAB, HEPBIGM    Microbiology:  Recent Results (from the past 240 hour(s))  Urine culture     Status: None   Collection Time: 02/13/18  5:28 PM  Result Value Ref Range Status   Specimen Description   Final    URINE, RANDOM Performed at  [facility], [address], [place], [place] [address]    Special Requests   Final    NONE Performed at [facility], [address] [place], [place] [address]    Culture   Final    NO GROWTH Performed at [facility], [place] [address], [place], [place] [address]    Report Status 02/14/2018 FINAL  Final    Coagulation Studies: Recent Labs    02/13/18 1823  LABPROT 24.7*  INR 2.25    Urinalysis: Recent Labs    02/13/18 1728  COLORURINE YELLOW*  LABSPEC 1.014  PHURINE 6.0  GLUCOSEU NEGATIVE  HGBUR NEGATIVE  BILIRUBINUR NEGATIVE  KETONESUR NEGATIVE  PROTEINUR NEGATIVE  NITRITE NEGATIVE  LEUKOCYTESUR NEGATIVE      Imaging: US Renal  Result Date: 02/14/2018 CLINICAL DATA:  Acute renal failure, elevated creatinine. EXAM: RENAL / URINARY TRACT ULTRASOUND COMPLETE COMPARISON:  None. FINDINGS: Right Kidney: Length: 11.9 cm. Normal echogenicity. 2.9 by 3.0 by 3.2 cm simple appearing right kidney upper pole cyst. 4.8 by 5.3 by 4.7 cm simple appearing right kidney lower pole cyst. Mild right hydronephrosis. No mass identified. Left Kidney: Length: 11.1 cm. Echogenicity within normal  limits. No mass observed. Mild left hydronephrosis. Bladder: Foley catheter is present in the urinary bladder. The bladder measures approximately 9.7 by 5.0 by 9.2 cm (volume = 230 cm^3). IMPRESSION: 1. Bilateral mild hydronephrosis. There is also moderate residual urine in the urinary bladder, volume 230 cubic cm, despite the presence of the Foley catheter. If the Foley catheter is not clamped, this could raise the possibility of a catheter malfunction or blockage. 2. Two simple appearing right renal cysts. Electronically Signed   By: Van Clines M.D.   On: 02/14/2018 10:23     Medications:   . famotidine (PEPCID) IV Stopped (02/14/18 2259)   . atorvastatin  80 mg Oral Daily  . ferrous sulfate  325 mg Oral Q breakfast  . gabapentin  300 mg Oral QHS  . insulin  aspart  0-15 Units Subcutaneous TID WC  . insulin aspart  0-5 Units Subcutaneous QHS  . metoprolol tartrate  12.5 mg Oral BID  . mometasone-formoterol  2 puff Inhalation BID  . pantoprazole  40 mg Oral Daily  . pramipexole  0.25 mg Oral QHS  . tiotropium  1 capsule Inhalation Daily  . [START ON 02/19/2018] warfarin  3.75 mg Oral Once per day on Thu  . warfarin  5 mg Oral Once per day on Sun Mon Tue Wed Fri Sat  . Warfarin - Physician Dosing Inpatient   Does not apply q1800   acetaminophen **OR** acetaminophen, albuterol, bisacodyl, ondansetron **OR** ondansetron (ZOFRAN) IV, polyethylene glycol, sodium phosphate, traMADol  Assessment/ Plan:  77 y.o. Caucasian female with a PMHX of breast cancer treated with radiation, lung cancer, diabetes, GERD, atrial fibrillation, recent ORIF for left leg tib/Fib fracture, was admitted on 02/13/2018 with ARF  Acute renal failure is felt to be secondary to acute urinary retention Opioids may be contributing to urinary retention Try to avoid opioids May attempt voiding trial in near future Serum creatinine has improved significantly.  Baseline creatinine 0.64 prior to admission    LOS: [place] 5/26/201910:28 [place], [place]  Note: This note was prepared with Dragon dictation. Any transcription errors are unintentional

## 2018-02-15 NOTE — NC FL2 (Signed)
[place] LEVEL OF CARE SCREENING TOOL     IDENTIFICATION  Patient Name: Tracy Dickerson Birthdate: 07/11/1941 Sex: female Admission Date (Current Location): 02/13/2018  [place] and [place] Number:  Selena Lesser [phone] [place] and Address:  [facility], [address], [place], [place] [address]      Provider Number: [id number]  Attending Physician Name and Address:  [name], MD  Relative Name and Phone Number:  Berneice Heinrich [facility]) [phone]    Current Level of Care: Hospital Recommended Level of Care: [place] Prior Approval Number:    Date Approved/Denied:   PASRR Number: [id number]  Discharge Plan: SNF    Current Diagnoses: Patient Active Problem List   Diagnosis Date Noted  . AKI (acute kidney injury) ([place]) 02/13/2018  . Closed extra-articular fracture of distal tibia 02/02/2018  . Pneumonia 10/29/2017  . Hypotension 01/26/2017    Orientation RESPIRATION BLADDER Height & Weight     Self, Time, Situation, Place  Normal Continent Weight: 245 lb 13 oz (111.5 kg) Height:  5\' 6"  (167.6 cm)  BEHAVIORAL SYMPTOMS/MOOD NEUROLOGICAL BOWEL NUTRITION STATUS      Continent Diet(Heart healthy/carb modified)  AMBULATORY STATUS COMMUNICATION OF NEEDS Skin   Extensive Assist Verbally Surgical wounds                       Personal Care Assistance Level of Assistance  Bathing, Feeding, Dressing Bathing Assistance: Limited assistance Feeding assistance: Independent Dressing Assistance: Limited assistance     Functional Limitations Info  Sight, Hearing, Speech Sight Info: Adequate Hearing Info: Adequate Speech Info: Adequate    SPECIAL CARE FACTORS FREQUENCY  PT (By licensed PT), OT (By licensed OT)     PT Frequency: Up to 5X per week OT Frequency: Up to 3X per week            Contractures Contractures Info: Not present    Additional Factors Info  Code Status, Allergies Code Status  Info: Full Allergies Info: Percocet Oxycodone-acetaminophen, Penicillins           Current Medications (02/15/2018):  This is the current hospital active medication list Current Facility-Administered Medications  Medication Dose Route Frequency Provider Last Rate Last Dose  . acetaminophen (TYLENOL) tablet 650 mg  650 mg Oral Q6H PRN Salary, Montell D, MD       Or  . acetaminophen (TYLENOL) suppository 650 mg  650 mg Rectal Q6H PRN Salary, Montell D, MD      . albuterol (PROVENTIL) (2.5 MG/3ML) 0.083% nebulizer solution 2.5 mg  2.5 mg Inhalation Q4H PRN Salary, Montell D, MD      . atorvastatin (LIPITOR) tablet 80 mg  80 mg Oral Daily Salary, Montell D, MD   80 mg at 02/15/18 0801  . bisacodyl (DULCOLAX) suppository 10 mg  10 mg Rectal Daily PRN Salary, Montell D, MD      . ferrous sulfate tablet 325 mg  325 mg Oral Q breakfast Salary, Montell D, MD   325 mg at 02/15/18 0801  . gabapentin (NEURONTIN) capsule 300 mg  300 mg Oral QHS Salary, Montell D, MD   300 mg at 02/14/18 2212  . insulin aspart (novoLOG) injection 0-15 Units  0-15 Units Subcutaneous TID WC Salary, Holly Bodily D, MD   3 Units at 02/14/18 1234  . insulin aspart (novoLOG) injection 0-5 Units  0-5 Units Subcutaneous QHS Salary, Montell D, MD      . metoprolol tartrate (LOPRESSOR) tablet 12.5 mg  12.5  mg Oral BID Loney Hering D, MD   12.5 mg at 02/15/18 0801  . mometasone-formoterol (DULERA) 200-5 MCG/ACT inhaler 2 puff  2 puff Inhalation BID Loney Hering D, MD   2 puff at 02/15/18 0801  . ondansetron (ZOFRAN) tablet 4 mg  4 mg Oral Q6H PRN Salary, Montell D, MD       Or  . ondansetron (ZOFRAN) injection 4 mg  4 mg Intravenous Q6H PRN Salary, Montell D, MD      . pantoprazole (PROTONIX) EC tablet 40 mg  40 mg Oral Daily Salary, Montell D, MD   40 mg at 02/15/18 0801  . polyethylene glycol (MIRALAX / GLYCOLAX) packet 17 g  17 g Oral Daily PRN Salary, Montell D, MD      . pramipexole (MIRAPEX) tablet 0.25 mg  0.25 mg Oral QHS  Salary, Montell D, MD   0.25 mg at 02/14/18 2212  . sodium phosphate (FLEET) 7-19 GM/118ML enema 1 enema  1 enema Rectal Once PRN Salary, Montell D, MD      . tiotropium (SPIRIVA) inhalation capsule 18 mcg  1 capsule Inhalation Daily Salary, Montell D, MD   18 mcg at 02/15/18 0801  . traMADol (ULTRAM) tablet 50 mg  50 mg Oral Q6H PRN [name], MD   50 mg at 02/15/18 0801  . [START ON 02/19/2018] warfarin (COUMADIN) tablet 3.75 mg  3.75 mg Oral Once per day on Thu Salary, Montell D, MD      . warfarin (COUMADIN) tablet 5 mg  5 mg Oral Once per day on Sun Mon Tue Wed Fri Sat Salary, Holly Bodily D, MD   5 mg at 02/14/18 1847     Discharge Medications: Please see discharge summary for a list of discharge medications.  Relevant Imaging Results:  Relevant Lab Results:   Additional Information [phone]  Zettie Pho, LCSW

## 2018-02-15 NOTE — Clinical Social Work Note (Signed)
Clinical Social Work Assessment  Patient Details  Name: Tracy Dickerson MRN: [id number] Date of Birth: [birth year]  Date of referral:  02/15/18               Reason for consult:  Facility Placement                Permission sought to share information with:  Chartered certified accountant granted to share information::  Yes, Verbal Permission Granted  Name::        Agency::  [facility]  Relationship::     Contact Information:     Housing/Transportation Living arrangements for the past 2 months:  [place], [place] of Information:  Patient, Medical Team, Facility Patient Interpreter Needed:  None Criminal Activity/Legal Involvement Pertinent to Current Situation/Hospitalization:  No - Comment as needed Significant Relationships:  Siblings Lives with:  Self Do you feel safe going back to the place where you live?  Yes Need for family participation in patient care:  No (Coment)  Care giving concerns:  The patient admitted from a SNF   Social Worker assessment / plan:  The CSW met with the patient at bedside to discuss discharge planning. The patient confirmed that she is a short term rehab resident from [facility] and would like to return when stable. The admissions coordinator from [facility], confirmed that the patient can return and will need a new [employer] auth. The patient will most likely discharge tomorrow or Tuesday once her foley is discontinued and she has completed a voiding trial. The CSW will follow for discharge facilitation.  Employment status:  Retired Nurse, adult PT Recommendations:  [place] / Referral to community resources:  [place]  Patient/Family's Response to care:  The patient thanked the CSW.  Patient/Family's Understanding of and Emotional Response to Diagnosis, Current Treatment, and  Prognosis:  The patient is in agreement with the plan to discharge back to her facility of origin when medically stable.  Emotional Assessment Appearance:  Appears stated age Attitude/Demeanor/Rapport:  Engaged, Gracious Affect (typically observed):  Accepting, Pleasant Orientation:  Oriented to Self, Oriented to Place, Oriented to  Time, Oriented to Situation Alcohol / Substance use:  Never Used Psych involvement (Current and /or in the community):  No (Comment)  Discharge Needs  Concerns to be addressed:  Discharge Planning Concerns, Care Coordination Readmission within the last 30 days:  Yes Current discharge risk:  None Barriers to Discharge:  Continued Medical Work up   [employer], LCSW 02/15/2018, 2:15 PM

## 2018-02-16 LAB — CBC
HCT: 26.5 % — ABNORMAL LOW (ref 35.0–47.0)
Hemoglobin: 9 g/dL — ABNORMAL LOW (ref 12.0–16.0)
MCH: 28.3 pg (ref 26.0–34.0)
MCHC: 34 g/dL (ref 32.0–36.0)
MCV: 83.5 fL (ref 80.0–100.0)
Platelets: 232 10*3/uL (ref 150–440)
RBC: 3.18 MIL/uL — ABNORMAL LOW (ref 3.80–5.20)
RDW: 19.8 % — ABNORMAL HIGH (ref 11.5–14.5)
WBC: 6.5 10*3/uL (ref 3.6–11.0)

## 2018-02-16 LAB — GLUCOSE, CAPILLARY
Glucose-Capillary: 102 mg/dL — ABNORMAL HIGH (ref 65–99)
Glucose-Capillary: 97 mg/dL (ref 65–99)

## 2018-02-16 LAB — BASIC METABOLIC PANEL
Anion gap: 7 (ref 5–15)
BUN: 35 mg/dL — ABNORMAL HIGH (ref 6–20)
CO2: 29 mmol/L (ref 22–32)
Calcium: 8.9 mg/dL (ref 8.9–10.3)
Chloride: 105 mmol/L (ref 101–111)
Creatinine, Ser: 1.34 mg/dL — ABNORMAL HIGH (ref 0.44–1.00)
GFR calc Af Amer: 44 mL/min — ABNORMAL LOW (ref >60)
GFR calc non Af Amer: 38 mL/min — ABNORMAL LOW (ref >60)
Glucose, Bld: 102 mg/dL — ABNORMAL HIGH (ref 65–99)
Potassium: 4.1 mmol/L (ref 3.5–5.1)
Sodium: 141 mmol/L (ref 135–145)

## 2018-02-16 LAB — PROTIME-INR
INR: 2.36
Prothrombin Time: 25.6 seconds — ABNORMAL HIGH (ref 11.4–15.2)

## 2018-02-16 MED ORDER — TRAMADOL HCL 50 MG PO TABS: 50.0000 mg | ORAL_TABLET | Freq: Four times a day (QID) | ORAL | 0 refills | Status: DC | PRN

## 2018-02-16 MED ORDER — BISACODYL 10 MG RE SUPP: 10.0000 mg | Freq: Every day | RECTAL | 0 refills | Status: DC | PRN

## 2018-02-16 MED ORDER — POLYETHYLENE GLYCOL 3350 17 G PO PACK: 17.0000 g | PACK | Freq: Every day | ORAL | 0 refills | Status: DC | PRN

## 2018-02-16 NOTE — Discharge Summary (Signed)
[facility] NAME: Tracy Dickerson    MR#:  [id number]  DATE OF BIRTH:  Jul 12, 1941  DATE OF ADMISSION:  02/13/2018 ADMITTING PHYSICIAN: Gorden Harms, MD  DATE OF DISCHARGE: 02/16/2018  PRIMARY CARE PHYSICIAN: Boykin Nearing, MD    ADMISSION DIAGNOSIS:  abn labs  DISCHARGE DIAGNOSIS:  Acute renal failure due to Acute Urinary retention now has FOLEY catheter  SECONDARY DIAGNOSIS:   Past Medical History:  Diagnosis Date  . Cancer ([place])    Breast and lung  . Diabetes mellitus without complication ([place])   . GERD (gastroesophageal reflux disease)   . Heart disease   . Hypertension     HOSPITAL COURSE:   GloriaWoodis a77 y.o.femalewith a known history per below sent from local inpatient rehab for abnormal lab value/elevated creatinine noted on routine lab testing 2 days ago, in the emergency room work-up noted for a creatinine 3.4 with baseline normal, hemoglobin 9 from 8.3, patient noted to have distended bladder/inability to void, Foley was placed-1800 cc of urine drained off  *Acute kidney injury Most likely secondary toacute urinary retention and medication side effect--?narcotics -patient is good urine output creatinine trending down. - Ultrasound shows bilateral mild hydronephrosis. -d/c foley -creatinine 3.40--- 2.53---1.5--1.3  *Acute urinary retention  Continue Foley to gravity--- bladder training and removed yday---retaining urine--bladder scan ~900cc--foley reinserted D/w Dr Junious Silk. Agrees with plan with outpt f/u  * diabetes mellitus type 2 Resume metformin, sliding scale insulin with Accu-Cheks per routine, ADA/heart healthy diet for now  *Chronic GERD without esophagitis  PPI daily  *h/o HTN -BP on softer side Cont BB D/ced HCTZ-Triamterene and lisinopril  *History of paroxysmal A. Fib Continue Lopressor, Coumadin -INR 2.3  *Recent acute tib-fib fracture Continue left  lower extremity splint, adult pain protocol -resume PT at rehab -pt will f/u ortho as per last week's d/c instructions  To rehab today with foley catheter  CONSULTS OBTAINED:  Treatment Team:  Murlean Iba, MD  DRUG ALLERGIES:   Allergies  Allergen Reactions  . Percocet [Oxycodone-Acetaminophen] Itching  . Penicillins Rash    Has patient had a PCN reaction causing immediate rash, facial/tongue/throat swelling, SOB or lightheadedness with hypotension: Yes Has patient had a PCN reaction causing severe rash involving mucus membranes or skin necrosis: Unknown Has patient had a PCN reaction that required hospitalization: Unknown Has patient had a PCN reaction occurring within the last 10 years: No If all of the above answers are "NO", then may proceed with Cephalosporin use.    DISCHARGE MEDICATIONS:   Allergies as of 02/16/2018      Reactions   Percocet [oxycodone-acetaminophen] Itching   Penicillins Rash   Has patient had a PCN reaction causing immediate rash, facial/tongue/throat swelling, SOB or lightheadedness with hypotension: Yes Has patient had a PCN reaction causing severe rash involving mucus membranes or skin necrosis: Unknown Has patient had a PCN reaction that required hospitalization: Unknown Has patient had a PCN reaction occurring within the last 10 years: No If all of the above answers are "NO", then may proceed with Cephalosporin use.      Medication List    STOP taking these medications   lisinopril 10 MG tablet Commonly known as:  PRINIVIL,ZESTRIL   oxyCODONE 5 MG immediate release tablet Commonly known as:  Oxy IR/ROXICODONE   triamterene-hydrochlorothiazide 37.5-25 MG tablet Commonly known as:  MAXZIDE-25     TAKE these medications   ADVAIR DISKUS 250-50 MCG/DOSE Aepb Generic drug:  Fluticasone-Salmeterol Inhale 1 puff into the lungs 2 (two) times daily.   atorvastatin 80 MG tablet Commonly known as:  LIPITOR Take 80 mg by mouth daily.    benzonatate 100 MG capsule Commonly known as:  TESSALON PERLES Take 1 capsule (100 mg total) by mouth every 6 (six) hours as needed for cough.   bisacodyl 10 MG suppository Commonly known as:  DULCOLAX Place 1 suppository (10 mg total) rectally daily as needed for moderate constipation.   gabapentin 300 MG capsule Commonly known as:  NEURONTIN Take 1 capsule (300 mg total) by mouth at bedtime.   metFORMIN 500 MG tablet Commonly known as:  GLUCOPHAGE Take 1 tablet (500 mg total) by mouth 2 (two) times daily.   metoprolol tartrate 25 MG tablet Commonly known as:  LOPRESSOR Take 0.5 tablets (12.5 mg total) by mouth 2 (two) times daily.   omeprazole 40 MG capsule Commonly known as:  PRILOSEC Take 40 mg by mouth daily.   polyethylene glycol packet Commonly known as:  MIRALAX / GLYCOLAX Take 17 g by mouth daily as needed for mild constipation.   pramipexole 0.25 MG tablet Commonly known as:  MIRAPEX Take 0.25 mg by mouth at bedtime.   PROAIR HFA 108 (90 Base) MCG/ACT inhaler Generic drug:  albuterol Inhale 2 puffs into the lungs every 4 (four) hours as needed.   SLOW FE 142 (45 Fe) MG Tbcr Generic drug:  Ferrous Sulfate Take 1 tablet by mouth daily.   SPIRIVA HANDIHALER 18 MCG inhalation capsule Generic drug:  tiotropium Place 1 capsule into inhaler and inhale daily.   traMADol 50 MG tablet Commonly known as:  ULTRAM Take 1 tablet (50 mg total) by mouth every 6 (six) hours as needed for moderate pain or severe pain.   warfarin 2.5 MG tablet Commonly known as:  COUMADIN Take 3.75-5 mg by mouth daily. Take 3.75MG  ON ThursdayS. 5MG  ON Monday,TUESDAY,WEDNESDAY,FRIDAY,SATURDAY,AND SUNDAY       If you experience worsening of your admission symptoms, develop shortness of breath, life threatening emergency, suicidal or homicidal thoughts you must seek medical attention immediately by calling 911 or calling your MD immediately  if symptoms less severe.  You Must read  complete instructions/literature along with all the possible adverse reactions/side effects for all the Medicines you take and that have been prescribed to you. Take any new Medicines after you have completely understood and accept all the possible adverse reactions/side effects.   Please note  You were cared for by a hospitalist during your hospital stay. If you have any questions about your discharge medications or the care you received while you were in the hospital after you are discharged, you can call the unit and asked to speak with the hospitalist on call if the hospitalist that took care of you is not available. Once you are discharged, your primary care physician will handle any further medical issues. Please note that NO REFILLS for any discharge medications will be authorized once you are discharged, as it is imperative that you return to your primary care physician (or establish a relationship with a primary care physician if you do not have one) for your aftercare needs so that they can reassess your need for medications and monitor your lab values. Today   SUBJECTIVE   Doing well  VITAL SIGNS:  Blood pressure (!) 107/54, pulse 69, temperature 97.7 F (36.5 C), temperature source Oral, resp. rate 14, height 5\' 6"  (1.676 m), weight 111.5 kg (245 lb 13 oz), SpO2 95 %.  I/O:    Intake/Output Summary (Last 24 hours) at 02/16/2018 0812 Last data filed at 02/16/2018 0606 Gross per 24 hour  Intake -  Output 1450 ml  Net -1450 ml    PHYSICAL EXAMINATION:  GENERAL:  77 y.o.-year-old patient lying in the bed with no acute distress.  EYES: Pupils equal, round, reactive to light and accommodation. No scleral icterus. Extraocular muscles intact.  HEENT: Head atraumatic, normocephalic. Oropharynx and nasopharynx clear.  NECK:  Supple, no jugular venous distention. No thyroid enlargement, no tenderness.  LUNGS: Normal breath sounds bilaterally, no wheezing, rales,rhonchi or crepitation.  No use of accessory muscles of respiration.  CARDIOVASCULAR: S1, S2 normal. No murmurs, rubs, or gallops.  ABDOMEN: Soft, non-tender, non-distended. Bowel sounds present. No organomegaly or mass. FOLEY++ EXTREMITIES: No pedal edema, cyanosis, or clubbing.  NEUROLOGIC: Cranial nerves II through XII are intact. Muscle strength 5/5 in all extremities. Sensation intact. Gait not checked.  PSYCHIATRIC: The patient is alert and oriented x 3.  SKIN: No obvious rash, lesion, or ulcer.   DATA REVIEW:   CBC  Recent Labs  Lab 02/16/18 0419  WBC 6.5  HGB 9.0*  HCT 26.5*  PLT 232    Chemistries  Recent Labs  Lab 02/13/18 1424  02/16/18 0419  NA 135   < > 141  K 5.0   < > 4.1  CL 103   < > 105  CO2 22   < > 29  GLUCOSE 109*   < > 102*  BUN 89*   < > 35*  CREATININE 3.48*   < > 1.34*  CALCIUM 8.7*   < > 8.9  AST 31  --   --   ALT 14  --   --   ALKPHOS 99  --   --   BILITOT 0.5  --   --    < > = values in this interval not displayed.    Microbiology Results   Recent Results (from the past 240 hour(s))  Urine culture     Status: None   Collection Time: 02/13/18  5:28 PM  Result Value Ref Range Status   Specimen Description   Final    URINE, RANDOM Performed at [facility], [address] [place], [place] [address]    Special Requests   Final    NONE Performed at [facility], [address] [place], [place] [address]    Culture   Final    NO GROWTH Performed at [facility], [place] [address] [place], [place] [address]    Report Status 02/14/2018 FINAL  Final    RADIOLOGY:  US Renal  Result Date: 02/14/2018 CLINICAL DATA:  Acute renal failure, elevated creatinine. EXAM: RENAL / URINARY TRACT ULTRASOUND COMPLETE COMPARISON:  None. FINDINGS: Right Kidney: Length: 11.9 cm. Normal echogenicity. 2.9 by 3.0 by 3.2 cm simple appearing right kidney upper pole cyst. 4.8 by 5.3 by 4.7 cm simple appearing right kidney lower pole cyst. Mild right  hydronephrosis. No mass identified. Left Kidney: Length: 11.1 cm. Echogenicity within normal limits. No mass observed. Mild left hydronephrosis. Bladder: Foley catheter is present in the urinary bladder. The bladder measures approximately 9.7 by 5.0 by 9.2 cm (volume = 230 cm^3). IMPRESSION: 1. Bilateral mild hydronephrosis. There is also moderate residual urine in the urinary bladder, volume 230 cubic cm, despite the presence of the Foley catheter. If the Foley catheter is not clamped, this could raise the possibility of a catheter malfunction or blockage. 2.  Two simple appearing right renal cysts. Electronically Signed   By: Van Clines M.D.   On: 02/14/2018 10:23     Management plans discussed with the patient, family and they are in agreement.  CODE STATUS:     Code Status Orders  (From admission, onward)        Start     Ordered   02/13/18 2026  Full code  Continuous     02/13/18 2026    Code Status History    Date Active Date Inactive Code Status Order ID Comments User Context   02/02/2018 2159 02/03/2018 2049 Full Code [id number]  Corky Mull, MD Inpatient   10/29/2017 1816 10/31/2017 1816 DNR [id number]  Hillary Bow, MD ED   01/26/2017 2250 01/27/2017 1427 Full Code [id number]  Henreitta Leber, MD ED      TOTAL TIME TAKING CARE OF THIS PATIENT: *40* minutes.    [name] M.D on 02/16/2018 at 8:12 AM  Between 7am to 6pm - Pager - [phone] After 6pm go to [email or web address] - password [email or web address] [facility]  Office  [phone]  CC: Primary care physician; Boykin Nearing, MD

## 2018-02-16 NOTE — Plan of Care (Signed)
  Problem: Spiritual Needs Goal: Ability to function at adequate level Outcome: Progressing   Problem: Education: Goal: Knowledge of General Education information will improve Outcome: Progressing   Problem: Health Behavior/Discharge Planning: Goal: Ability to manage health-related needs will improve Outcome: Progressing   Problem: Clinical Measurements: Goal: Ability to maintain clinical measurements within normal limits will improve Outcome: Progressing Goal: Will remain free from infection Outcome: Progressing Goal: Diagnostic test results will improve Outcome: Progressing Goal: Respiratory complications will improve Outcome: Progressing Goal: Cardiovascular complication will be avoided Outcome: Progressing   Problem: Activity: Goal: Risk for activity intolerance will decrease Outcome: Progressing   Problem: Nutrition: Goal: Adequate nutrition will be maintained Outcome: Progressing   Problem: Coping: Goal: Level of anxiety will decrease Outcome: Progressing   Problem: Elimination: Goal: Will not experience complications related to bowel motility Outcome: Progressing Note:  Pt had bm this am  supp and miralax given unable to empty bladder well.  I/o earlier this am/ retaining urine foley insertion per md and pt will f/u outpt with urology Goal: Will not experience complications related to urinary retention Outcome: Progressing   Problem: Pain Managment: Goal: General experience of comfort will improve Outcome: Progressing   Problem: Safety: Goal: Ability to remain free from injury will improve Outcome: Progressing   Problem: Skin Integrity: Goal: Risk for impaired skin integrity will decrease Outcome: Progressing

## 2018-02-16 NOTE — Progress Notes (Signed)
[facility], [place] 02/16/18  Subjective:  Kidney function has continued to improve. Creatinine down to 1.3. Urine output was 1.7 L over the preceding 24 hours.   Objective:  Vital signs in last 24 hours:  Temp:  [97.5 F (36.4 C)-97.7 F (36.5 C)] 97.5 F (36.4 C) (05/27 0929) Pulse Rate:  [69-85] 76 (05/27 0929) Resp:  [14-20] 20 (05/27 0929) BP: (105-113)/(46-80) 111/80 (05/27 0929) SpO2:  [95 %-99 %] 97 % (05/27 0929)  Weight change:  Filed Weights   02/13/18 1407 02/14/18 0440 02/15/18 0423  Weight: 111.6 kg (246 lb) 110.3 kg (243 lb 2.7 oz) 111.5 kg (245 lb 13 oz)    Intake/Output:    Intake/Output Summary (Last 24 hours) at 02/16/2018 1158 Last data filed at 02/16/2018 0606 Gross per 24 hour  Intake -  Output 1250 ml  Net -1250 ml     Physical Exam: General:  Laying in the bed, no acute distress  HEENT  moist oral mucous membranes  Neck  supple  Pulm/lungs  lungs are clear to auscultation  CVS/Heart  regular  Abdomen:   Soft, nontender  Extremities:  Trace edema  Neurologic:  Alert, oriented  Skin:  No acute rashes          Basic Metabolic Panel:  Recent Labs  Lab 02/13/18 1424 02/14/18 0515 02/15/18 0929 02/16/18 0419  NA 135 140 137 141  K 5.0 4.6 3.9 4.1  CL 103 109 104 105  CO2 22 25 27 29   GLUCOSE 109* 94 149* 102*  BUN 89* 87* 48* 35*  CREATININE 3.48* 2.53* 1.51* 1.34*  CALCIUM 8.7* 8.4* 8.8* 8.9     CBC: Recent Labs  Lab 02/13/18 1424 02/16/18 0419  WBC 11.0 6.5  NEUTROABS 8.4*  --   HGB 9.1* 9.0*  HCT 29.1* 26.5*  MCV 79.6* 83.5  PLT 308 232     No results found for: HEPBSAG, HEPBSAB, HEPBIGM    Microbiology:  Recent Results (from the past 240 hour(s))  Urine culture     Status: None   Collection Time: 02/13/18  5:28 PM  Result Value Ref Range Status   Specimen Description   Final    URINE, RANDOM Performed at [facility], [address], [place], [place] [address]     Special Requests   Final    NONE Performed at [facility], [address] [place], [place] [address]    Culture   Final    NO GROWTH Performed at [facility], [place] [address] [place], [place] [address]    Report Status 02/14/2018 FINAL  Final    Coagulation Studies: Recent Labs    02/13/18 1823 02/16/18 0419  LABPROT 24.7* 25.6*  INR 2.25 2.36    Urinalysis: Recent Labs    02/13/18 1728  COLORURINE YELLOW*  LABSPEC 1.014  PHURINE 6.0  GLUCOSEU NEGATIVE  HGBUR NEGATIVE  BILIRUBINUR NEGATIVE  KETONESUR NEGATIVE  PROTEINUR NEGATIVE  NITRITE NEGATIVE  LEUKOCYTESUR NEGATIVE      Imaging: No results found.   Medications:    . atorvastatin  80 mg Oral Daily  . ferrous sulfate  325 mg Oral Q breakfast  . gabapentin  300 mg Oral QHS  . insulin aspart  0-15 Units Subcutaneous TID WC  . insulin aspart  0-5 Units Subcutaneous QHS  . metoprolol tartrate  12.5 mg Oral BID  . mometasone-formoterol  2 puff Inhalation BID  . pantoprazole  40 mg Oral Daily  .  pramipexole  0.25 mg Oral QHS  . tiotropium  1 capsule Inhalation Daily  . [START ON 02/19/2018] warfarin  3.75 mg Oral Once per day on Thu  . warfarin  5 mg Oral Once per day on Sun Mon Tue Wed Fri Sat   acetaminophen **OR** acetaminophen, albuterol, bisacodyl, ondansetron **OR** ondansetron (ZOFRAN) IV, polyethylene glycol, sodium phosphate, traMADol  Assessment/ Plan:  77 y.o. Caucasian female with a PMHX of breast cancer treated with radiation, lung cancer, diabetes, GERD, atrial fibrillation, recent ORIF for left leg tib/Fib fracture, was admitted on 02/13/2018 with ARF  1.  Acute renal failure. 2.  Urinary retention. 3.  Anemia unspecified.    Plan:  Renal function has improved significantly.  Creatinine down to 1.3.  Recommend rechecking renal function within the next week.  As before attempt to avoid narcotics as tolerated.  Thanks for allowing [place] to participate.    LOS: 3 Aimar [place],  Dorisann Schwanke 5/27/201911:58 AM  [place], [place]  Note: This note was prepared with Dragon dictation. Any transcription errors are unintentional

## 2018-02-16 NOTE — Care Management Important Message (Signed)
Important Message  Patient Details  Name: Tracy Dickerson MRN: [id number] Date of Birth: February 24, 1941   Medicare Important Message Given:  Yes    [name] 02/16/2018, 11:55 AM

## 2018-02-16 NOTE — Progress Notes (Signed)
[facility] for warfarin dosing  Indication: atrial fibrillation  Allergies  Allergen Reactions  . Percocet [Oxycodone-Acetaminophen] Itching  . Penicillins Rash    Has patient had a PCN reaction causing immediate rash, facial/tongue/throat swelling, SOB or lightheadedness with hypotension: Yes Has patient had a PCN reaction causing severe rash involving mucus membranes or skin necrosis: Unknown Has patient had a PCN reaction that required hospitalization: Unknown Has patient had a PCN reaction occurring within the last 10 years: No If all of the above answers are "NO", then may proceed with Cephalosporin use.    Patient Measurements: Height: 5\' 6"  (167.6 cm) Weight: 245 lb 13 oz (111.5 kg) IBW/kg (Calculated) : 59.3  Vital Signs: Temp: 97.5 F (36.4 C) (05/27 0929) Temp Source: Oral (05/27 0929) BP: 111/80 (05/27 0929) Pulse Rate: 76 (05/27 0929)  Labs: Recent Labs    02/13/18 1424 02/13/18 1823 02/14/18 0515 02/15/18 0929 02/16/18 0419  HGB 9.1*  --   --   --  9.0*  HCT 29.1*  --   --   --  26.5*  PLT 308  --   --   --  232  LABPROT  --  24.7*  --   --  25.6*  INR  --  2.25  --   --  2.36  CREATININE 3.48*  --  2.53* 1.51* 1.34*    Estimated Creatinine Clearance: 46.6 mL/min (A) (by C-G formula based on SCr of 1.34 mg/dL (H)).   Assessment: Pharmacy consulted for warfarin management for 77 yo female admitted with AKI on warfarin for atrial fibrillation, goal INR 2-3. Patient takes warfarin 3.75mg  on Thursdays and 5 mg all other days.   Goal of Therapy:  INR 2-3 Monitor platelets by anticoagulation protocol: Yes   Plan:  INR within goal. Will continue patient on home regimen. Will check INRs daily while admitted. Hgb stable.   Pharmacy will continue to monitor and adjust per consult.    Rayna Sexton L 02/16/2018,12:59 PM

## 2018-02-16 NOTE — Progress Notes (Signed)
[facility] called to transport pt

## 2018-02-16 NOTE — Clinical Social Work Note (Addendum)
CSW was informed that SNF has received insurance authorization.  Patient to be d/c'ed today to [facility] room 64A. Patient and family agreeable to plans will transport via ems RN to call report [phone].  CSW left message on patient's brother Robert's voice mail, [phone] to inform him that patient is discharging today.   Evette Cristal, MSW, [place]

## 2018-02-16 NOTE — Progress Notes (Signed)
Pt to be discharged back to [facility]. A/o no distress. Left lower ext intact. Cast intact toes wm to touch and blanch well. supp given earlier and  Pt had good bm. Foley inserted for urinary retention.report called to shantique ford at [employer]

## 2018-02-17 ENCOUNTER — Telehealth: Payer: Self-pay | Admitting: Urology

## 2018-02-17 NOTE — Telephone Encounter (Signed)
-----   Message from Festus Aloe, MD sent at 02/16/2018  3:04 PM EDT ----- Regarding: FW: urinary retention See below from hospitalist -- This pt needs f/u in 5 - 7 days for urinary retention. Thank you.    ----- Message ----- From: [name], MD Sent: 02/16/2018   8:02 AM To: Festus Aloe, MD Subject: urinary retention                              Dr Severiano Gilbert get f/u appt for Tracy Dickerson for Urinary retention. She will be discharged today with foley to rehab for her ankle fracture.  Thanks,  [name]

## 2018-02-17 NOTE — Telephone Encounter (Signed)
App has been made and patient has been notified ° ° °Tracy Dickerson °

## 2018-02-26 ENCOUNTER — Ambulatory Visit (INDEPENDENT_AMBULATORY_CARE_PROVIDER_SITE_OTHER): Payer: Medicare Other | Admitting: Urology

## 2018-02-26 ENCOUNTER — Encounter: Payer: Self-pay | Admitting: Urology

## 2018-02-26 VITALS — BP 113/69 | HR 83 | Ht 66.0 in | Wt 246.0 lb

## 2018-02-26 DIAGNOSIS — R339 Retention of urine, unspecified: Secondary | ICD-10-CM | POA: Diagnosis not present

## 2018-02-26 NOTE — Progress Notes (Signed)
02/26/2018 8:23 AM   Tracy Dickerson Sep 20, 1941 [id number]  Referring provider: Boykin Nearing, MD No address on file  Chief Complaint  Patient presents with  . Urinary Retention    HPI: Tracy Dickerson is a 77 year old female who presents for follow-up of recent hospitalization.  She presented to the [facility] ED on 02/13/2018 for a creatinine of 3.4 with a baseline value of normal.  She was noted to have a distended bladder and urinary retention.  Foley catheter was placed with 1800 mL of urine obtained.  Renal ultrasound showed mild, bilateral hydronephrosis.  She had no pain or discomfort at the time of presentation.  Her creatinine at the time of discharge was 1.3.  A voiding trial was attempted prior to discharge and she had persistent urinary retention and a Foley catheter was replaced.  She was in rehab at the time of her admission from a recent lower extremity fracture.  She denies previous history of urologic problems, prior urologic evaluation or urinary retention.  There is no neurologic history including CVA, degenerative disc disease or chronic constipation/impaction.  PMH: Past Medical History:  Diagnosis Date  . Cancer ([place])    Breast and lung  . Diabetes mellitus without complication ([place])   . GERD (gastroesophageal reflux disease)   . Heart disease   . Hypertension     Surgical History: Past Surgical History:  Procedure Laterality Date  . ABDOMINAL HYSTERECTOMY    . APPENDECTOMY    . BREAST SURGERY     breast cancer LEFT  . OPEN REDUCTION INTERNAL FIXATION (ORIF) TIBIA/FIBULA FRACTURE Left 02/03/2018   Procedure: OPEN REDUCTION INTERNAL FIXATION (ORIF) DISTAL TIBIA FRACTURE;  Surgeon: Corky Mull, MD;  Location: [facility] ORS;  Service: Orthopedics;  Laterality: Left;  ankle/lower leg  . TONSILLECTOMY      Home Medications:  Allergies as of 02/26/2018      Reactions   Percocet [oxycodone-acetaminophen] Itching   Penicillins Rash   Has patient had a PCN reaction  causing immediate rash, facial/tongue/throat swelling, SOB or lightheadedness with hypotension: Yes Has patient had a PCN reaction causing severe rash involving mucus membranes or skin necrosis: Unknown Has patient had a PCN reaction that required hospitalization: Unknown Has patient had a PCN reaction occurring within the last 10 years: No If all of the above answers are "NO", then may proceed with Cephalosporin use.      Medication List        Accurate as of 02/26/18  8:23 AM. Always use your most recent med list.          ADVAIR DISKUS 250-50 MCG/DOSE Aepb Generic drug:  Fluticasone-Salmeterol Inhale 1 puff into the lungs 2 (two) times daily.   AEROCHAMBER MV inhaler by Does not apply route.   atorvastatin 80 MG tablet Commonly known as:  LIPITOR Take 80 mg by mouth daily.   benzonatate 100 MG capsule Commonly known as:  TESSALON PERLES Take 1 capsule (100 mg total) by mouth every 6 (six) hours as needed for cough.   bisacodyl 10 MG suppository Commonly known as:  DULCOLAX Place 1 suppository (10 mg total) rectally daily as needed for moderate constipation.   diclofenac sodium 1 % Gel Commonly known as:  VOLTAREN Apply topically.   ferrous sulfate 160 (50 Fe) MG Tbcr SR tablet Commonly known as:  SLOW FE Take by mouth.   gabapentin 300 MG capsule Commonly known as:  NEURONTIN Take 1 capsule (300 mg total) by mouth at bedtime.  lisinopril 20 MG tablet Commonly known as:  PRINIVIL,ZESTRIL Take by mouth.   metFORMIN 500 MG tablet Commonly known as:  GLUCOPHAGE Take 1 tablet (500 mg total) by mouth 2 (two) times daily.   metoprolol tartrate 25 MG tablet Commonly known as:  LOPRESSOR Take 0.5 tablets (12.5 mg total) by mouth 2 (two) times daily.   omeprazole 40 MG capsule Commonly known as:  PRILOSEC Take 40 mg by mouth daily.   polyethylene glycol packet Commonly known as:  MIRALAX / GLYCOLAX Take 17 g by mouth daily as needed for mild constipation.     pramipexole 0.25 MG tablet Commonly known as:  MIRAPEX Take 0.25 mg by mouth at bedtime.   PROAIR HFA 108 (90 Base) MCG/ACT inhaler Generic drug:  albuterol Inhale 2 puffs into the lungs every 4 (four) hours as needed.   simvastatin 40 MG tablet Commonly known as:  ZOCOR Take by mouth.   SLOW FE 142 (45 Fe) MG Tbcr Generic drug:  Ferrous Sulfate Take 1 tablet by mouth daily.   SPIRIVA HANDIHALER 18 MCG inhalation capsule Generic drug:  tiotropium Place 1 capsule into inhaler and inhale daily.   traMADol 50 MG tablet Commonly known as:  ULTRAM Take 1 tablet (50 mg total) by mouth every 6 (six) hours as needed for moderate pain or severe pain.   triamterene-hydrochlorothiazide 37.5-25 MG capsule Commonly known as:  DYAZIDE   UNABLE TO FIND Pt has DM with neuropathy w/ callus, Followed for comprehensive care. For med records, fax release to [phone]   warfarin 2.5 MG tablet Commonly known as:  COUMADIN Take 3.75-5 mg by mouth daily. Take 3.75MG  ON ThursdayS. 5MG  ON Monday,TUESDAY,WEDNESDAY,FRIDAY,SATURDAY,AND SUNDAY       Allergies:  Allergies  Allergen Reactions  . Percocet [Oxycodone-Acetaminophen] Itching  . Penicillins Rash    Has patient had a PCN reaction causing immediate rash, facial/tongue/throat swelling, SOB or lightheadedness with hypotension: Yes Has patient had a PCN reaction causing severe rash involving mucus membranes or skin necrosis: Unknown Has patient had a PCN reaction that required hospitalization: Unknown Has patient had a PCN reaction occurring within the last 10 years: No If all of the above answers are "NO", then may proceed with Cephalosporin use.    Family History: Family History  Problem Relation Age of Onset  . Cancer Mother     Social History:  reports that she has quit smoking. She has never used smokeless tobacco. She reports that she drinks alcohol. She reports that she does not use drugs.  ROS: UROLOGY Frequent  Urination?: No Hard to postpone urination?: No Burning/pain with urination?: No Get up at night to urinate?: No Leakage of urine?: No Urine stream starts and stops?: No Trouble starting stream?: No Do you have to strain to urinate?: No Blood in urine?: No Urinary tract infection?: No Sexually transmitted disease?: No Injury to kidneys or bladder?: No Painful intercourse?: No Weak stream?: No Currently pregnant?: No Vaginal bleeding?: No Last menstrual period?: N/A  Gastrointestinal Nausea?: No Vomiting?: No Indigestion/heartburn?: No Diarrhea?: No Constipation?: No  Constitutional Fever: No Night sweats?: No Weight loss?: No Fatigue?: No  Skin Skin rash/lesions?: No Itching?: No  Eyes Blurred vision?: No Double vision?: No  Ears/Nose/Throat Sore throat?: No Sinus problems?: No  Hematologic/Lymphatic Swollen glands?: No Easy bruising?: No  Cardiovascular Leg swelling?: No Chest pain?: No  Respiratory Cough?: No Shortness of breath?: No  Endocrine Excessive thirst?: No  Musculoskeletal Back pain?: No Joint pain?: No  Neurological Headaches?: No Dizziness?:  No  Psychologic Depression?: No Anxiety?: No  Physical Exam: BP 113/69 (BP Location: Left Arm, Patient Position: Sitting, Cuff Size: Large)   Pulse 83   Ht 5\' 6"  (1.676 m)   Wt 246 lb (111.6 kg)   BMI 39.71 kg/m   Constitutional:  Alert and oriented, No acute distress. HEENT: [place] AT, moist mucus membranes.  Trachea midline, no masses. Cardiovascular: No clubbing, cyanosis, or edema. Respiratory: Normal respiratory effort, no increased work of breathing. GI: Abdomen is soft, nontender, nondistended, no abdominal masses GU: No CVA tenderness.  Foley catheter in place draining clear urine Lymph: No cervical or inguinal lymphadenopathy. Skin: No rashes, bruises or suspicious lesions. Neurologic: Grossly intact, no focal deficits Psychiatric: Normal mood and affect.   Results for  orders placed during the hospital encounter of 02/13/18  US RENAL   Narrative CLINICAL DATA:  Acute renal failure, elevated creatinine.  EXAM: RENAL / URINARY TRACT ULTRASOUND COMPLETE  COMPARISON:  None.  FINDINGS: Right Kidney:  Length: 11.9 cm. Normal echogenicity. 2.9 by 3.0 by 3.2 cm simple appearing right kidney upper pole cyst. 4.8 by 5.3 by 4.7 cm simple appearing right kidney lower pole cyst. Mild right hydronephrosis. No mass identified.  Left Kidney:  Length: 11.1 cm. Echogenicity within normal limits. No mass observed. Mild left hydronephrosis.  Bladder:  Foley catheter is present in the urinary bladder. The bladder measures approximately 9.7 by 5.0 by 9.2 cm (volume = 230 cm^3).  IMPRESSION: 1. Bilateral mild hydronephrosis. There is also moderate residual urine in the urinary bladder, volume 230 cubic cm, despite the presence of the Foley catheter. If the Foley catheter is not clamped, this could raise the possibility of a catheter malfunction or blockage. 2. Two simple appearing right renal cysts.   Electronically Signed   By: Van Clines M.D.   On: 02/14/2018 10:23     Assessment & Plan:   77 year old female with urinary retention.  Her catheter has been indwelling for over 1 week and have recommended a repeat voiding trial.  She is still in skilled nursing rehab in order was written for Foley catheter removal with a PVR by either bladder scan or in and out catheterization and to replace the catheter for a residual greater than 300 mL.  Follow-up 2 to 3 weeks for nurse visit/bladder scan.  Urodynamics for persistent urinary retention.   Abbie Sons, [place] [address], [place], [place] [address] [phone]

## 2018-03-11 ENCOUNTER — Ambulatory Visit: Payer: Medicare Other

## 2018-03-13 ENCOUNTER — Other Ambulatory Visit: Payer: Self-pay

## 2018-03-13 ENCOUNTER — Inpatient Hospital Stay
Admission: EM | Admit: 2018-03-13 | Discharge: 2018-03-20 | DRG: 871 | Disposition: A | Discharge status: Skilled Nursing Facility | Payer: Medicare Other | Source: Skilled Nursing Facility | Attending: Internal Medicine | Admitting: Internal Medicine

## 2018-03-13 ENCOUNTER — Emergency Department: Payer: Medicare Other

## 2018-03-13 ENCOUNTER — Inpatient Hospital Stay: Payer: Medicare Other

## 2018-03-13 DIAGNOSIS — D649 Anemia, unspecified: Secondary | ICD-10-CM | POA: Diagnosis not present

## 2018-03-13 DIAGNOSIS — N183 Chronic kidney disease, stage 3 (moderate): Secondary | ICD-10-CM | POA: Diagnosis present

## 2018-03-13 DIAGNOSIS — E1122 Type 2 diabetes mellitus with diabetic chronic kidney disease: Secondary | ICD-10-CM | POA: Diagnosis present

## 2018-03-13 DIAGNOSIS — R059 Cough, unspecified: Secondary | ICD-10-CM

## 2018-03-13 DIAGNOSIS — A4152 Sepsis due to Pseudomonas: Principal | ICD-10-CM | POA: Diagnosis present

## 2018-03-13 DIAGNOSIS — X58XXXD Exposure to other specified factors, subsequent encounter: Secondary | ICD-10-CM

## 2018-03-13 DIAGNOSIS — E875 Hyperkalemia: Secondary | ICD-10-CM

## 2018-03-13 DIAGNOSIS — D509 Iron deficiency anemia, unspecified: Secondary | ICD-10-CM | POA: Diagnosis present

## 2018-03-13 DIAGNOSIS — R791 Abnormal coagulation profile: Secondary | ICD-10-CM

## 2018-03-13 DIAGNOSIS — E669 Obesity, unspecified: Secondary | ICD-10-CM | POA: Diagnosis present

## 2018-03-13 DIAGNOSIS — N3001 Acute cystitis with hematuria: Secondary | ICD-10-CM | POA: Diagnosis present

## 2018-03-13 DIAGNOSIS — R6521 Severe sepsis with septic shock: Secondary | ICD-10-CM | POA: Diagnosis present

## 2018-03-13 DIAGNOSIS — Z7901 Long term (current) use of anticoagulants: Secondary | ICD-10-CM

## 2018-03-13 DIAGNOSIS — J449 Chronic obstructive pulmonary disease, unspecified: Secondary | ICD-10-CM | POA: Diagnosis present

## 2018-03-13 DIAGNOSIS — D6832 Hemorrhagic disorder due to extrinsic circulating anticoagulants: Secondary | ICD-10-CM | POA: Diagnosis present

## 2018-03-13 DIAGNOSIS — N390 Urinary tract infection, site not specified: Secondary | ICD-10-CM | POA: Diagnosis not present

## 2018-03-13 DIAGNOSIS — T45515A Adverse effect of anticoagulants, initial encounter: Secondary | ICD-10-CM | POA: Diagnosis present

## 2018-03-13 DIAGNOSIS — R338 Other retention of urine: Secondary | ICD-10-CM | POA: Diagnosis present

## 2018-03-13 DIAGNOSIS — E8770 Fluid overload, unspecified: Secondary | ICD-10-CM | POA: Diagnosis not present

## 2018-03-13 DIAGNOSIS — Z8701 Personal history of pneumonia (recurrent): Secondary | ICD-10-CM

## 2018-03-13 DIAGNOSIS — Z79899 Other long term (current) drug therapy: Secondary | ICD-10-CM

## 2018-03-13 DIAGNOSIS — E1140 Type 2 diabetes mellitus with diabetic neuropathy, unspecified: Secondary | ICD-10-CM | POA: Diagnosis present

## 2018-03-13 DIAGNOSIS — E785 Hyperlipidemia, unspecified: Secondary | ICD-10-CM | POA: Diagnosis present

## 2018-03-13 DIAGNOSIS — Z90710 Acquired absence of both cervix and uterus: Secondary | ICD-10-CM

## 2018-03-13 DIAGNOSIS — S82392D Other fracture of lower end of left tibia, subsequent encounter for closed fracture with routine healing: Secondary | ICD-10-CM | POA: Diagnosis not present

## 2018-03-13 DIAGNOSIS — N179 Acute kidney failure, unspecified: Secondary | ICD-10-CM | POA: Diagnosis present

## 2018-03-13 DIAGNOSIS — T17908A Unspecified foreign body in respiratory tract, part unspecified causing other injury, initial encounter: Secondary | ICD-10-CM

## 2018-03-13 DIAGNOSIS — Z6839 Body mass index (BMI) 39.0-39.9, adult: Secondary | ICD-10-CM

## 2018-03-13 DIAGNOSIS — I482 Chronic atrial fibrillation: Secondary | ICD-10-CM | POA: Diagnosis present

## 2018-03-13 DIAGNOSIS — G2581 Restless legs syndrome: Secondary | ICD-10-CM | POA: Diagnosis present

## 2018-03-13 DIAGNOSIS — K219 Gastro-esophageal reflux disease without esophagitis: Secondary | ICD-10-CM | POA: Diagnosis present

## 2018-03-13 DIAGNOSIS — R109 Unspecified abdominal pain: Secondary | ICD-10-CM

## 2018-03-13 DIAGNOSIS — R4781 Slurred speech: Secondary | ICD-10-CM | POA: Diagnosis present

## 2018-03-13 DIAGNOSIS — Z7984 Long term (current) use of oral hypoglycemic drugs: Secondary | ICD-10-CM

## 2018-03-13 DIAGNOSIS — Z87891 Personal history of nicotine dependence: Secondary | ICD-10-CM

## 2018-03-13 DIAGNOSIS — Z7951 Long term (current) use of inhaled steroids: Secondary | ICD-10-CM

## 2018-03-13 DIAGNOSIS — A419 Sepsis, unspecified organism: Secondary | ICD-10-CM | POA: Diagnosis not present

## 2018-03-13 DIAGNOSIS — Z880 Allergy status to penicillin: Secondary | ICD-10-CM

## 2018-03-13 DIAGNOSIS — Z87440 Personal history of urinary (tract) infections: Secondary | ICD-10-CM

## 2018-03-13 DIAGNOSIS — Z9981 Dependence on supplemental oxygen: Secondary | ICD-10-CM | POA: Diagnosis not present

## 2018-03-13 DIAGNOSIS — R7881 Bacteremia: Secondary | ICD-10-CM | POA: Diagnosis not present

## 2018-03-13 DIAGNOSIS — E876 Hypokalemia: Secondary | ICD-10-CM | POA: Diagnosis not present

## 2018-03-13 DIAGNOSIS — R339 Retention of urine, unspecified: Secondary | ICD-10-CM

## 2018-03-13 DIAGNOSIS — I129 Hypertensive chronic kidney disease with stage 1 through stage 4 chronic kidney disease, or unspecified chronic kidney disease: Secondary | ICD-10-CM | POA: Diagnosis present

## 2018-03-13 DIAGNOSIS — Z85118 Personal history of other malignant neoplasm of bronchus and lung: Secondary | ICD-10-CM

## 2018-03-13 DIAGNOSIS — Z960 Presence of urogenital implants: Secondary | ICD-10-CM | POA: Diagnosis present

## 2018-03-13 DIAGNOSIS — N189 Chronic kidney disease, unspecified: Secondary | ICD-10-CM

## 2018-03-13 DIAGNOSIS — Z9089 Acquired absence of other organs: Secondary | ICD-10-CM

## 2018-03-13 DIAGNOSIS — Z885 Allergy status to narcotic agent status: Secondary | ICD-10-CM

## 2018-03-13 DIAGNOSIS — Z9049 Acquired absence of other specified parts of digestive tract: Secondary | ICD-10-CM

## 2018-03-13 DIAGNOSIS — Z853 Personal history of malignant neoplasm of breast: Secondary | ICD-10-CM

## 2018-03-13 DIAGNOSIS — R05 Cough: Secondary | ICD-10-CM

## 2018-03-13 LAB — COMPREHENSIVE METABOLIC PANEL
ALT: 28 U/L (ref 14–54)
ALT: 28 U/L (ref 14–54)
AST: 70 U/L — ABNORMAL HIGH (ref 15–41)
AST: 69 U/L — ABNORMAL HIGH (ref 15–41)
Albumin: 1.9 g/dL — ABNORMAL LOW (ref 3.5–5.0)
Albumin: 2 g/dL — ABNORMAL LOW (ref 3.5–5.0)
Alkaline Phosphatase: 137 U/L — ABNORMAL HIGH (ref 38–126)
Alkaline Phosphatase: 153 U/L — ABNORMAL HIGH (ref 38–126)
Anion gap: 12 (ref 5–15)
Anion gap: 10 (ref 5–15)
BUN: 92 mg/dL — ABNORMAL HIGH (ref 6–20)
BUN: 91 mg/dL — ABNORMAL HIGH (ref 6–20)
CO2: 18 mmol/L — ABNORMAL LOW (ref 22–32)
CO2: 19 mmol/L — ABNORMAL LOW (ref 22–32)
Calcium: 8 mg/dL — ABNORMAL LOW (ref 8.9–10.3)
Calcium: 7.4 mg/dL — ABNORMAL LOW (ref 8.9–10.3)
Chloride: 107 mmol/L (ref 101–111)
Chloride: 110 mmol/L (ref 101–111)
Creatinine, Ser: 3.11 mg/dL — ABNORMAL HIGH (ref 0.44–1.00)
Creatinine, Ser: 2.73 mg/dL — ABNORMAL HIGH (ref 0.44–1.00)
GFR calc Af Amer: 16 mL/min — ABNORMAL LOW (ref >60)
GFR calc Af Amer: 19 mL/min — ABNORMAL LOW (ref >60)
GFR calc non Af Amer: 14 mL/min — ABNORMAL LOW (ref >60)
GFR calc non Af Amer: 16 mL/min — ABNORMAL LOW (ref >60)
Glucose, Bld: 140 mg/dL — ABNORMAL HIGH (ref 65–99)
Glucose, Bld: 152 mg/dL — ABNORMAL HIGH (ref 65–99)
Potassium: 5.7 mmol/L — ABNORMAL HIGH (ref 3.5–5.1)
Potassium: 4.9 mmol/L (ref 3.5–5.1)
Sodium: 137 mmol/L (ref 135–145)
Sodium: 139 mmol/L (ref 135–145)
Total Bilirubin: 0.8 mg/dL (ref 0.3–1.2)
Total Bilirubin: 1 mg/dL (ref 0.3–1.2)
Total Protein: 7.2 g/dL (ref 6.5–8.1)
Total Protein: 7.4 g/dL (ref 6.5–8.1)

## 2018-03-13 LAB — CBC WITH DIFFERENTIAL/PLATELET
Basophils Absolute: 0.1 10*3/uL (ref 0–0.1)
Basophils Relative: 0 %
Eosinophils Absolute: 0 10*3/uL (ref 0–0.7)
Eosinophils Relative: 0 %
HCT: 25.9 % — ABNORMAL LOW (ref 35.0–47.0)
Hemoglobin: 8.6 g/dL — ABNORMAL LOW (ref 12.0–16.0)
Lymphocytes Relative: 4 %
Lymphs Abs: 1 10*3/uL (ref 1.0–3.6)
MCH: 29.9 pg (ref 26.0–34.0)
MCHC: 33.2 g/dL (ref 32.0–36.0)
MCV: 90.1 fL (ref 80.0–100.0)
Monocytes Absolute: 0.8 10*3/uL (ref 0.2–0.9)
Monocytes Relative: 4 %
Neutro Abs: 20.7 10*3/uL — ABNORMAL HIGH (ref 1.4–6.5)
Neutrophils Relative %: 92 %
Platelets: 434 10*3/uL (ref 150–440)
RBC: 2.87 MIL/uL — ABNORMAL LOW (ref 3.80–5.20)
RDW: 21.9 % — ABNORMAL HIGH (ref 11.5–14.5)
WBC: 22.6 10*3/uL — ABNORMAL HIGH (ref 3.6–11.0)

## 2018-03-13 LAB — URINALYSIS, ROUTINE W REFLEX MICROSCOPIC
Bilirubin Urine: NEGATIVE
Glucose, UA: NEGATIVE mg/dL
Ketones, ur: NEGATIVE mg/dL
Nitrite: POSITIVE — AB
Protein, ur: 100 mg/dL — AB
RBC / HPF: >50 RBC/hpf — ABNORMAL HIGH (ref 0–5)
Specific Gravity, Urine: 1.011 (ref 1.005–1.030)
WBC, UA: >50 WBC/hpf — ABNORMAL HIGH (ref 0–5)
pH: 7 (ref 5.0–8.0)

## 2018-03-13 LAB — CBC
HCT: 26.8 % — ABNORMAL LOW (ref 35.0–47.0)
Hemoglobin: 8.5 g/dL — ABNORMAL LOW (ref 12.0–16.0)
MCH: 26.3 pg (ref 26.0–34.0)
MCHC: 31.8 g/dL — ABNORMAL LOW (ref 32.0–36.0)
MCV: 82.8 fL (ref 80.0–100.0)
Platelets: 396 10*3/uL (ref 150–440)
RBC: 3.24 MIL/uL — ABNORMAL LOW (ref 3.80–5.20)
RDW: 22.7 % — ABNORMAL HIGH (ref 11.5–14.5)
WBC: 27.1 10*3/uL — ABNORMAL HIGH (ref 3.6–11.0)

## 2018-03-13 LAB — LACTIC ACID, PLASMA
Lactic Acid, Venous: 1.9 mmol/L (ref 0.5–1.9)
Lactic Acid, Venous: 1.7 mmol/L (ref 0.5–1.9)

## 2018-03-13 LAB — MRSA PCR SCREENING: MRSA by PCR: NEGATIVE

## 2018-03-13 LAB — MAGNESIUM: Magnesium: 2.7 mg/dL — ABNORMAL HIGH (ref 1.7–2.4)

## 2018-03-13 LAB — GLUCOSE, CAPILLARY: Glucose-Capillary: 144 mg/dL — ABNORMAL HIGH (ref 65–99)

## 2018-03-13 LAB — PROTIME-INR
INR: >10
INR: 2.61
Prothrombin Time: >90 seconds — ABNORMAL HIGH (ref 11.4–15.2)
Prothrombin Time: 27.7 seconds — ABNORMAL HIGH (ref 11.4–15.2)

## 2018-03-13 LAB — PHOSPHORUS: Phosphorus: 4.3 mg/dL (ref 2.5–4.6)

## 2018-03-13 LAB — TROPONIN I: Troponin I: 0.06 ng/mL (ref <0.03)

## 2018-03-13 MED ORDER — DEXTROSE 5 % IV SOLN
10.0000 mg | Freq: Once | INTRAVENOUS | Status: AC
  Administered 2018-03-13: 10 mg via INTRAVENOUS
  Filled 2018-03-13: qty 1

## 2018-03-13 MED ORDER — SODIUM CHLORIDE 0.9 % IV SOLN
INTRAVENOUS | Status: DC
  Administered 2018-03-13 – 2018-03-16 (×2): via INTRAVENOUS

## 2018-03-13 MED ORDER — VANCOMYCIN HCL 10 G IV SOLR
1500.0000 mg | Freq: Once | INTRAVENOUS | Status: DC
  Filled 2018-03-13: qty 1500

## 2018-03-13 MED ORDER — PANTOPRAZOLE SODIUM 40 MG PO TBEC
40.0000 mg | DELAYED_RELEASE_TABLET | Freq: Every day | ORAL | Status: DC
  Administered 2018-03-13 – 2018-03-20 (×7): 40 mg via ORAL
  Filled 2018-03-13 (×7): qty 1

## 2018-03-13 MED ORDER — SODIUM BICARBONATE 8.4 % IV SOLN
50.0000 meq | Freq: Once | INTRAVENOUS | Status: AC
  Administered 2018-03-13: 50 meq via INTRAVENOUS
  Filled 2018-03-13: qty 50

## 2018-03-13 MED ORDER — INSULIN ASPART 100 UNIT/ML ~~LOC~~ SOLN
10.0000 [IU] | Freq: Once | SUBCUTANEOUS | Status: AC
  Administered 2018-03-13: 10 [IU] via INTRAVENOUS
  Filled 2018-03-13: qty 1

## 2018-03-13 MED ORDER — ATORVASTATIN CALCIUM 20 MG PO TABS
80.0000 mg | ORAL_TABLET | Freq: Every day | ORAL | Status: DC
  Administered 2018-03-13 – 2018-03-20 (×7): 80 mg via ORAL
  Filled 2018-03-13 (×7): qty 4

## 2018-03-13 MED ORDER — SODIUM CHLORIDE 0.9 % IV BOLUS (SEPSIS)
800.0000 mL | Freq: Once | INTRAVENOUS | Status: AC
  Administered 2018-03-13: 800 mL via INTRAVENOUS

## 2018-03-13 MED ORDER — ONDANSETRON HCL 4 MG PO TABS
4.0000 mg | ORAL_TABLET | Freq: Four times a day (QID) | ORAL | Status: DC | PRN
  Administered 2018-03-19: 23:00:00 4 mg via ORAL
  Filled 2018-03-13: qty 1

## 2018-03-13 MED ORDER — POLYETHYLENE GLYCOL 3350 17 G PO PACK
17.0000 g | PACK | Freq: Every day | ORAL | Status: DC | PRN
  Administered 2018-03-17: 08:00:00 17 g via ORAL
  Filled 2018-03-13: qty 1

## 2018-03-13 MED ORDER — SODIUM CHLORIDE 0.9 % IV SOLN
2.0000 g | INTRAVENOUS | Status: DC
  Administered 2018-03-14: 2 g via INTRAVENOUS
  Filled 2018-03-13 (×2): qty 2

## 2018-03-13 MED ORDER — PRAMIPEXOLE DIHYDROCHLORIDE 0.25 MG PO TABS
0.2500 mg | ORAL_TABLET | Freq: Every day | ORAL | Status: DC
  Administered 2018-03-13 – 2018-03-19 (×6): 0.25 mg via ORAL
  Filled 2018-03-13 (×6): qty 1

## 2018-03-13 MED ORDER — GABAPENTIN 300 MG PO CAPS
300.0000 mg | ORAL_CAPSULE | Freq: Every day | ORAL | Status: DC
  Administered 2018-03-13 – 2018-03-19 (×6): 300 mg via ORAL
  Filled 2018-03-13 (×7): qty 1

## 2018-03-13 MED ORDER — FERROUS SULFATE 325 (65 FE) MG PO TABS
325.0000 mg | ORAL_TABLET | Freq: Every day | ORAL | Status: DC
  Administered 2018-03-13 – 2018-03-20 (×7): 325 mg via ORAL
  Filled 2018-03-13 (×7): qty 1

## 2018-03-13 MED ORDER — VANCOMYCIN HCL IN DEXTROSE 1-5 GM/200ML-% IV SOLN
1000.0000 mg | Freq: Once | INTRAVENOUS | Status: AC
  Administered 2018-03-13: 1000 mg via INTRAVENOUS
  Filled 2018-03-13: qty 200

## 2018-03-13 MED ORDER — ACETAMINOPHEN 325 MG PO TABS
650.0000 mg | ORAL_TABLET | Freq: Four times a day (QID) | ORAL | Status: DC | PRN
Start: 2018-03-13 — End: 2018-03-20
  Administered 2018-03-18 – 2018-03-20 (×4): 650 mg via ORAL
  Filled 2018-03-13 (×4): qty 2

## 2018-03-13 MED ORDER — SODIUM CHLORIDE 0.9 % IV BOLUS (SEPSIS)
1000.0000 mL | Freq: Once | INTRAVENOUS | Status: AC
  Administered 2018-03-13: 1000 mL via INTRAVENOUS

## 2018-03-13 MED ORDER — SODIUM CHLORIDE 0.9 % IV SOLN
2.0000 g | Freq: Once | INTRAVENOUS | Status: AC
  Administered 2018-03-13: 2 g via INTRAVENOUS
  Filled 2018-03-13: qty 2

## 2018-03-13 MED ORDER — DEXTROSE 50 % IV SOLN
1.0000 | Freq: Once | INTRAVENOUS | Status: AC
  Administered 2018-03-13: 50 mL via INTRAVENOUS
  Filled 2018-03-13: qty 50

## 2018-03-13 MED ORDER — ONDANSETRON HCL 4 MG/2ML IJ SOLN: 4.0000 mg | Freq: Four times a day (QID) | INTRAMUSCULAR | Status: DC | PRN

## 2018-03-13 MED ORDER — BISACODYL 10 MG RE SUPP
10.0000 mg | Freq: Every day | RECTAL | Status: DC | PRN
  Filled 2018-03-13: qty 1

## 2018-03-13 MED ORDER — NOREPINEPHRINE 4 MG/250ML-% IV SOLN
0.0000 ug/min | INTRAVENOUS | Status: DC
  Administered 2018-03-13: 2 ug/min via INTRAVENOUS
  Filled 2018-03-13: qty 250

## 2018-03-13 MED ORDER — ACETAMINOPHEN 650 MG RE SUPP: 650.0000 mg | Freq: Four times a day (QID) | RECTAL | Status: DC | PRN

## 2018-03-13 MED ORDER — SODIUM CHLORIDE 0.9 % IV BOLUS
500.0000 mL | Freq: Once | INTRAVENOUS | Status: AC
  Administered 2018-03-13: 500 mL via INTRAVENOUS

## 2018-03-13 MED ORDER — SODIUM CHLORIDE 0.9 % IV SOLN
Freq: Once | INTRAVENOUS | Status: AC
  Administered 2018-03-13: 16:00:00 via INTRAVENOUS

## 2018-03-13 NOTE — ED Notes (Signed)
This RN to contact hospitalist about pt's inpt location.

## 2018-03-13 NOTE — ED Triage Notes (Signed)
To ER via [facility] from [facility] for evaluation of weakness. Staff report patinet has been at this baseline X 1 week. Febrile, tachycardic. 22 G R hand by EMS. CBG 145 with EMS.

## 2018-03-13 NOTE — ED Notes (Signed)
ED Provider at bedside. 

## 2018-03-13 NOTE — Progress Notes (Signed)
CODE SEPSIS - PHARMACY COMMUNICATION  **Broad Spectrum Antibiotics should be administered within 1 hour of Sepsis diagnosis**  Time Code Sepsis Called/Page Received: 1432  Antibiotics Ordered: Cefepime  Time of 1st antibiotic administration: 4730  Additional action taken by pharmacy:   If necessary, Name of Provider/Nurse Contacted:   Ramond Dial ,PharmD Clinical Pharmacist  03/13/2018  2:34 PM

## 2018-03-13 NOTE — Progress Notes (Signed)
Advanced care plan. Purpose of the Encounter: CODE STATUS Parties in Attendance:Patient Patient's Decision Capacity:Good Subjective/Patient's story: Has fever and dysuria Objective/Medical story Has sepsis, uti, hypotension Goals of care determination:  Advance care directives and goals of care discussed Patient wants everything done which includes cardiac resuscitation, intubation CODE STATUS: Full code Time spent discussing advanced care planning: 16 minutes

## 2018-03-13 NOTE — Progress Notes (Signed)
Patient still hypotensive in ER Received fluid bolusses Will admit patient to step down unit Will need iv pressor meds for support of BP [facility] ICU informed

## 2018-03-13 NOTE — ED Notes (Signed)
Bladder scan revealed >842ml

## 2018-03-13 NOTE — ED Provider Notes (Addendum)
[facility] Emergency Department Provider Note  ____________________________________________  Time seen: Approximately 2:31 PM  I have reviewed the triage vital signs and the nursing notes.   HISTORY  Chief Complaint Weakness  Level 5 caveat:  Portions of the history and physical were unable to be obtained due to weakness and ams   HPI Tracy Dickerson is a 77 y.o. female with a history of breast cancer and lung cancer currently on remission, diabetes, hypertension, anemia, atrial fibrillation on Coumadin who presents for evaluation of generalized weakness.  According to EMS patient has had progressively worsening generalized weakness for a week.  Today had a fever of 102F at the nursing home.  Patient is on chronic oxygen.  She complains of a productive cough for the last few days and worsening shortness of breath.  She is also complaining of diffuse abdominal pain, generalized weakness.  No chest pain, no vomiting or diarrhea.  Patient looks extremely weak and is a very poor historian.  Past Medical History:  Diagnosis Date  . Cancer ([place])    Breast and lung  . Diabetes mellitus without complication ([place])   . GERD (gastroesophageal reflux disease)   . Heart disease   . Hypertension     Patient Active Problem List   Diagnosis Date Noted  . AKI (acute kidney injury) ([place]) 02/13/2018  . Closed extra-articular fracture of distal tibia 02/02/2018  . Pneumonia 10/29/2017  . Hypotension 01/26/2017    Past Surgical History:  Procedure Laterality Date  . ABDOMINAL HYSTERECTOMY    . APPENDECTOMY    . BREAST SURGERY     breast cancer LEFT  . OPEN REDUCTION INTERNAL FIXATION (ORIF) TIBIA/FIBULA FRACTURE Left 02/03/2018   Procedure: OPEN REDUCTION INTERNAL FIXATION (ORIF) DISTAL TIBIA FRACTURE;  Surgeon: Corky Mull, MD;  Location: [facility] ORS;  Service: Orthopedics;  Laterality: Left;  ankle/lower leg  . TONSILLECTOMY      Prior to Admission medications     Medication Sig Start Date End Date Taking? Authorizing Provider  ADVAIR DISKUS 250-50 MCG/DOSE AEPB Inhale 1 puff into the lungs 2 (two) times daily. 12/14/16  Yes [provider]  albuterol (PROAIR HFA) 108 (90 Base) MCG/ACT inhaler Inhale 2 puffs into the lungs every 4 (four) hours as needed. 04/08/17  Yes [provider]  atorvastatin (LIPITOR) 80 MG tablet Take 80 mg by mouth daily.   Yes [provider]  benzonatate (TESSALON PERLES) 100 MG capsule Take 1 capsule (100 mg total) by mouth every 6 (six) hours as needed for cough. 01/31/18 01/31/19 Yes Harvest Dark, MD  bisacodyl (DULCOLAX) 10 MG suppository Place 1 suppository (10 mg total) rectally daily as needed for moderate constipation. 02/16/18  Yes [name], MD  ferrous sulfate (SLOW FE) 160 (50 Fe) MG TBCR SR tablet Take 160 mg by mouth daily.  04/18/17  Yes [provider]  gabapentin (NEURONTIN) 300 MG capsule Take 1 capsule (300 mg total) by mouth at bedtime. 11/24/17  Yes Nance Pear, MD  metFORMIN (GLUCOPHAGE) 500 MG tablet Take 1 tablet (500 mg total) by mouth 2 (two) times daily. 02/06/15 03/13/18 Yes Paduchowski, Lennette Bihari, MD  metoprolol tartrate (LOPRESSOR) 25 MG tablet Take 0.5 tablets (12.5 mg total) by mouth 2 (two) times daily. 02/05/18  Yes Mody, Ulice Bold, MD  omeprazole (PRILOSEC) 40 MG capsule Take 40 mg by mouth daily. 01/15/17  Yes [provider]  polyethylene glycol (MIRALAX / GLYCOLAX) packet Take 17 g by mouth daily as needed for mild  constipation. 02/16/18  Yes [name], MD  pramipexole (MIRAPEX) 0.25 MG tablet Take 0.25 mg by mouth at bedtime. 12/23/16  Yes [provider]  SPIRIVA HANDIHALER 18 MCG inhalation capsule Place 1 capsule into inhaler and inhale daily. 01/26/18  Yes [provider]  traMADol (ULTRAM) 50 MG tablet Take 1 tablet (50 mg total) by mouth every 6 (six) hours as needed for moderate pain or severe pain. 02/16/18  Yes [name], MD   warfarin (COUMADIN) 4 MG tablet Give 1 tablet (4MG ) by mouth every Monday, Wednesday, Thursday, Friday, Saturday and Sunday evening  Give  tablet (2MG ) by mouth every Tuesday evening   Yes [provider]    Allergies Oxycodone; Percocet [oxycodone-acetaminophen]; and Penicillins  Family History  Problem Relation Age of Onset  . Cancer Mother     Social History Social History   Tobacco Use  . Smoking status: Former Research scientist (life sciences)  . Smokeless tobacco: Never Used  Substance Use Topics  . Alcohol use: Yes  . Drug use: No    Review of Systems  Constitutional: Negative for fever. + generalized weakness Eyes: Negative for visual changes. ENT: Negative for sore throat. Neck: No neck pain  Cardiovascular: Negative for chest pain. Respiratory: + shortness of breath, cough Gastrointestinal: + abdominal pain. No vomiting or diarrhea. Genitourinary: Negative for dysuria. Musculoskeletal: Negative for back pain. Skin: Negative for rash. Neurological: Negative for headaches, weakness or numbness. Psych: No SI or HI  ____________________________________________   PHYSICAL EXAM:  VITAL SIGNS: ED Triage Vitals  Enc Vitals Group     BP 03/13/18 1421 (!) 84/47     Pulse Rate 03/13/18 1421 (!) 111     Resp 03/13/18 1421 (!) 34     Temp 03/13/18 1421 98.6 F (37 C)     Temp Source 03/13/18 1421 Oral     SpO2 03/13/18 1421 100 %     Weight 03/13/18 1423 246 lb (111.6 kg)     Height 03/13/18 1423 5\' 6"  (1.676 m)     Head Circumference --      Peak Flow --      Pain Score 03/13/18 1423 0     Pain Loc --      Pain Edu? --      Excl. in [place]? --     Constitutional: Patient keeps her eyes closed, very slow to answer questions, looks very dry on exam  HEENT:      Head: Normocephalic and atraumatic.         Eyes: Conjunctivae are normal. Sclera is non-icteric.       Mouth/Throat: Mucous membranes are dry.       Neck: Supple with no signs of meningismus. Cardiovascular:  Tachycardic with regular rhythm. No murmurs, gallops, or rubs. 2+ symmetrical distal pulses are present in all extremities. No JVD. Respiratory: Normal respiratory effort.  Satting 100% on 3 L nasal cannula which is her baseline, patient has crackles on the right lung, clear on the left  Gastrointestinal: Obese, diffusely tender to palpation, non distended with positive bowel sounds. No rebound or guarding. Musculoskeletal: cast in the L tib/fib region. No edema, cyanosis, or erythema of extremities. Neurologic: Speech is slurred. Face is symmetric. Moving all extremities. No gross focal neurologic deficits are appreciated. Skin: Skin is warm, dry and intact. No rash noted. Psychiatric: Mood and affect are normal. Speech and behavior are normal.  ____________________________________________   LABS (all labs ordered are listed, but only abnormal results are displayed)  Labs  Reviewed  COMPREHENSIVE METABOLIC PANEL - Abnormal; Notable for the following components:      Result Value   Potassium 5.7 (*)    CO2 18 (*)    Glucose, Bld 140 (*)    BUN 92 (*)    Creatinine, Ser 3.11 (*)    Calcium 8.0 (*)    Albumin 1.9 (*)    AST 70 (*)    Alkaline Phosphatase 137 (*)    GFR calc non Af Amer 14 (*)    GFR calc Af Amer 16 (*)    All other components within normal limits  CBC WITH DIFFERENTIAL/PLATELET - Abnormal; Notable for the following components:   WBC 22.6 (*)    RBC 2.87 (*)    Hemoglobin 8.6 (*)    HCT 25.9 (*)    RDW 21.9 (*)    Neutro Abs 20.7 (*)    All other components within normal limits  URINALYSIS, ROUTINE W REFLEX MICROSCOPIC - Abnormal; Notable for the following components:   Color, Urine AMBER (*)    APPearance TURBID (*)    Hgb urine dipstick LARGE (*)    Protein, ur 100 (*)    Nitrite POSITIVE (*)    Leukocytes, UA LARGE (*)    RBC / HPF >50 (*)    WBC, UA >50 (*)    Bacteria, UA MANY (*)    All other components within normal limits  PROTIME-INR - Abnormal;  Notable for the following components:   Prothrombin Time >90.0 (*)    INR >10.00 (*)    All other components within normal limits  CULTURE, BLOOD (ROUTINE X 2)  CULTURE, BLOOD (ROUTINE X 2)  URINE CULTURE  LACTIC ACID, PLASMA  LACTIC ACID, PLASMA   ____________________________________________  EKG  ED ECG REPORT I, Rudene Re, the attending physician, personally viewed and interpreted this ECG.  Sinus tachycardia, rate of 115, normal intervals, normal axis, no ST elevations or depressions. No peaked T waves.   ____________________________________________  RADIOLOGY  I have personally reviewed the images performed during this visit and I agree with the Radiologist's read.   Interpretation by Radiologist:  Dg Chest Port 1 View  Result Date: 03/13/2018 CLINICAL DATA:  Weakness.  History of breast and lung cancer. EXAM: PORTABLE CHEST 1 VIEW COMPARISON:  02/04/2018.  01/21/2018.  01/26/2017.  06/06/2016. FINDINGS: Stable cardiomegaly. Chronic interstitial changes are again noted. Low lung volumes. Mild bilateral pleural thickening again noted consistent scarring. Surgical clips left axilla. IMPRESSION: 1.  Stable cardiomegaly.  No pulmonary venous congestion. 2. Chronic interstitial lung disease. Low lung volumes with mild bibasilar atelectasis. Bilateral pleural thickening again noted consistent scarring similar findings noted on prior studies. Electronically Signed   By: Marcello Moores  Register   On: 03/13/2018 15:02      ____________________________________________   PROCEDURES  Procedure(s) performed: None Procedures Critical Care performed: yes  CRITICAL CARE Performed by: Rudene Re  ?  Total critical care time: 45 min  Critical care time was exclusive of separately billable procedures and treating other patients.  Critical care was necessary to treat or prevent imminent or life-threatening deterioration.  Critical care was time spent personally by me on  the following activities: development of treatment plan with patient and/or surrogate as well as nursing, discussions with consultants, evaluation of patient's response to treatment, examination of patient, obtaining history from patient or surrogate, ordering and performing treatments and interventions, ordering and review of laboratory studies, ordering and review of radiographic studies, pulse oximetry and re-evaluation of  patient's condition.  ____________________________________________   INITIAL IMPRESSION / ASSESSMENT AND PLAN / ED COURSE  77 y.o. female with a history of breast cancer and lung cancer currently on remission, diabetes, hypertension, anemia, atrial fibrillation on Coumadin who presents for evaluation of generalized weakness, fever, cough, SOB, generalized abdominal pain.  Patient is ill-appearing, looks very dry on exam, slurring speech which I think is due to dry mucous membranes, she has crackles on the right side, she is sating 100% on 3 L which is her baseline, her abdomen is obese and diffusely tender to palpation with no rebound or guarding.  Vitals concerning for severe sepsis with blood pressure of 84/47, heart rate of 111.  Patient had a fever at the nursing home and a fever per EMS however she is afebrile here.  Unclear if patient received Tylenol prior to transport.  Will start patient on fluids and broad-spectrum antibiotics.  During her last admission a month ago patient was found to have urinary retention and had a Foley catheter placed which is no longer present.  Will check for urinary retention as possible cause of her abdominal pain.  If that is not the case we will send patient for CT abdomen pelvis.  We will do a chest x-ray to eval for pneumonia.  Will check labs and urine.    _________________________ 4:13 PM on 03/13/2018 -----------------------------------------  Patient with urosepsis, urinary retention, 850 cc of purulent urine drained with a Foley  catheter.  Vitals improved after IV fluids.  INR greater than 10 with no obvious bleeding.  Vitamin K has been ordered.  Patient given cefepime and vancomycin for catheter associated UTI.  Acute on chronic kidney injury.  Hyperkalemia no EKG changes for which she was given D50, insulin, bicarb, and fluids.  Discussed with the hospitalist for ICU admission.   As part of my medical decision making, I reviewed the following data within the [facility] notes reviewed and incorporated, Labs reviewed , EKG interpreted , Old chart reviewed, Radiograph reviewed , Discussed with admitting physician , Notes from prior ED visits and  Controlled Substance Database    Pertinent labs & imaging results that were available during my care of the patient were reviewed by me and considered in my medical decision making (see chart for details).    ____________________________________________   FINAL CLINICAL IMPRESSION(S) / ED DIAGNOSES  Final diagnoses:  Sepsis, due to unspecified organism ([place])  Sepsis due to urinary tract infection ([place])  Acute kidney injury superimposed on chronic kidney disease ([place])  Hyperkalemia  Urinary retention  Supratherapeutic INR      NEW MEDICATIONS STARTED DURING THIS VISIT:  ED Discharge Orders    None       Note:  This document was prepared using Dragon voice recognition software and may include unintentional dictation errors.    Alfred Levins, [place], MD 03/13/18 [place], [place], MD 03/13/18 [phone]

## 2018-03-13 NOTE — Consult Note (Signed)
Pharmacy Antibiotic Note  Tracy KAHLER is a 77 y.o. female admitted on 03/13/2018 with sepsis and UTI.  Pharmacy has been consulted for vancomycin and cefepime dosing.  Plan: Cefepime 2g q 24 hours Patient is in acute renal failure, therefore vancomycin will need to be dosed off of levels. Pt received 1g of vancomycin in the ED. I will give 1000mg  in 6 hours and draw a level about 34 hours later.  Height: 5\' 6"  (167.6 cm) Weight: 246 lb (111.6 kg) IBW/kg (Calculated) : 59.3  Temp (24hrs), Avg:99.9 F (37.7 C), Min:98.6 F (37 C), Max:100.7 F (38.2 C)  Recent Labs  Lab 03/13/18 1427 03/13/18 1446  WBC  --  22.6*  CREATININE  --  3.11*  LATICACIDVEN 1.9  --     Estimated Creatinine Clearance: 20.1 mL/min (A) (by C-G formula based on SCr of 3.11 mg/dL (H)).    Allergies  Allergen Reactions  . Oxycodone   . Percocet [Oxycodone-Acetaminophen] Itching  . Penicillins Rash    Has patient had a PCN reaction causing immediate rash, facial/tongue/throat swelling, SOB or lightheadedness with hypotension: Yes Has patient had a PCN reaction causing severe rash involving mucus membranes or skin necrosis: Unknown Has patient had a PCN reaction that required hospitalization: Unknown Has patient had a PCN reaction occurring within the last 10 years: No If all of the above answers are "NO", then may proceed with Cephalosporin use.    Antimicrobials this admission: vancomycin 6/21 >>  cefepime 6/21 >>   Dose adjustments this admission:   Microbiology results: 6/21 BCx:  6/21 UCx:     Thank you for allowing pharmacy to be a part of this patient's care.  Ramond Dial, Pharm.D, BCPS Clinical Pharmacist  03/13/2018 6:03 PM

## 2018-03-13 NOTE — ED Notes (Signed)
X Ray at bedside at this time.

## 2018-03-13 NOTE — H&P (Addendum)
[facility] [place] NAME: Tracy Dickerson    MR#:  [id number]  DATE OF BIRTH:  Jun 10, 1941  DATE OF ADMISSION:  03/13/2018  PRIMARY CARE PHYSICIAN: Boykin Nearing, MD   REQUESTING/REFERRING PHYSICIAN:   CHIEF COMPLAINT:   Chief Complaint  Patient presents with  . Weakness    HISTORY OF PRESENT ILLNESS: Tracy Dickerson  is a 77 y.o. female with a known history of breast cancer, diabetes mellitus type 2, GERD, hypertension presented to from [facility] for lethargy and fever.  Agent on oral Coumadin for anticoagulation at the facility.  She was found to have fever of 102 F in the nursing home.  Patient has foul-smelling urine also lower abdominal discomfort.  She has weakness and fatigue.  Patient is on oxygen at the facility.  Patient was evaluated in the emergency room was found to have urinary tract infection and sepsis.  Her blood pressure was low initially when she presented to the emergency room and she was resuscitated with IV fluids.  She was started on broad-spectrum IV antibiotics and urinalysis showed infection.  Patient's INR was more than 10 and she received IV vitamin K.  Hospitalist service was consulted for further care.  PAST MEDICAL HISTORY:   Past Medical History:  Diagnosis Date  . Cancer ([place])    Breast and lung  . Diabetes mellitus without complication ([place])   . GERD (gastroesophageal reflux disease)   . Heart disease   . Hypertension     PAST SURGICAL HISTORY:  Past Surgical History:  Procedure Laterality Date  . ABDOMINAL HYSTERECTOMY    . APPENDECTOMY    . BREAST SURGERY     breast cancer LEFT  . OPEN REDUCTION INTERNAL FIXATION (ORIF) TIBIA/FIBULA FRACTURE Left 02/03/2018   Procedure: OPEN REDUCTION INTERNAL FIXATION (ORIF) DISTAL TIBIA FRACTURE;  Surgeon: Corky Mull, MD;  Location: [facility] ORS;  Service: Orthopedics;  Laterality: Left;  ankle/lower leg  . TONSILLECTOMY      SOCIAL  HISTORY:  Social History   Tobacco Use  . Smoking status: Former Research scientist (life sciences)  . Smokeless tobacco: Never Used  Substance Use Topics  . Alcohol use: Yes    FAMILY HISTORY:  Family History  Problem Relation Age of Onset  . Cancer Mother     DRUG ALLERGIES:  Allergies  Allergen Reactions  . Oxycodone   . Percocet [Oxycodone-Acetaminophen] Itching  . Penicillins Rash    Has patient had a PCN reaction causing immediate rash, facial/tongue/throat swelling, SOB or lightheadedness with hypotension: Yes Has patient had a PCN reaction causing severe rash involving mucus membranes or skin necrosis: Unknown Has patient had a PCN reaction that required hospitalization: Unknown Has patient had a PCN reaction occurring within the last 10 years: No If all of the above answers are "NO", then may proceed with Cephalosporin use.    REVIEW OF SYSTEMS:   CONSTITUTIONAL: Has fever, fatigue and weakness.  EYES: No blurred or double vision.  EARS, NOSE, AND THROAT: No tinnitus or ear pain.  RESPIRATORY: No cough, shortness of breath, wheezing or hemoptysis.  CARDIOVASCULAR: No chest pain, orthopnea, edema.  GASTROINTESTINAL: No nausea, vomiting, diarrhea  Has lower abdominal pain.  GENITOURINARY: Has dysuria, hematuria and pyuria.  ENDOCRINE: No polyuria, nocturia,  HEMATOLOGY: No anemia, easy bruising or bleeding SKIN: No rash or lesion. MUSCULOSKELETAL: No joint pain or arthritis.   NEUROLOGIC: No tingling, numbness, weakness.  PSYCHIATRY: No anxiety or depression.   MEDICATIONS  AT HOME:  Prior to Admission medications   Medication Sig Start Date End Date Taking? Authorizing Provider  ADVAIR DISKUS 250-50 MCG/DOSE AEPB Inhale 1 puff into the lungs 2 (two) times daily. 12/14/16  Yes [provider]  albuterol (PROAIR HFA) 108 (90 Base) MCG/ACT inhaler Inhale 2 puffs into the lungs every 4 (four) hours as needed. 04/08/17  Yes [provider]  atorvastatin (LIPITOR) 80 MG tablet  Take 80 mg by mouth daily.   Yes [provider]  benzonatate (TESSALON PERLES) 100 MG capsule Take 1 capsule (100 mg total) by mouth every 6 (six) hours as needed for cough. 01/31/18 01/31/19 Yes Harvest Dark, MD  bisacodyl (DULCOLAX) 10 MG suppository Place 1 suppository (10 mg total) rectally daily as needed for moderate constipation. 02/16/18  Yes [name], MD  ferrous sulfate (SLOW FE) 160 (50 Fe) MG TBCR SR tablet Take 160 mg by mouth daily.  04/18/17  Yes [provider]  gabapentin (NEURONTIN) 300 MG capsule Take 1 capsule (300 mg total) by mouth at bedtime. 11/24/17  Yes Nance Pear, MD  metFORMIN (GLUCOPHAGE) 500 MG tablet Take 1 tablet (500 mg total) by mouth 2 (two) times daily. 02/06/15 03/13/18 Yes Paduchowski, Lennette Bihari, MD  metoprolol tartrate (LOPRESSOR) 25 MG tablet Take 0.5 tablets (12.5 mg total) by mouth 2 (two) times daily. 02/05/18  Yes Mody, Ulice Bold, MD  omeprazole (PRILOSEC) 40 MG capsule Take 40 mg by mouth daily. 01/15/17  Yes [provider]  polyethylene glycol (MIRALAX / GLYCOLAX) packet Take 17 g by mouth daily as needed for mild constipation. 02/16/18  Yes [name], MD  pramipexole (MIRAPEX) 0.25 MG tablet Take 0.25 mg by mouth at bedtime. 12/23/16  Yes [provider]  SPIRIVA HANDIHALER 18 MCG inhalation capsule Place 1 capsule into inhaler and inhale daily. 01/26/18  Yes [provider]  traMADol (ULTRAM) 50 MG tablet Take 1 tablet (50 mg total) by mouth every 6 (six) hours as needed for moderate pain or severe pain. 02/16/18  Yes [name], MD  warfarin (COUMADIN) 4 MG tablet Give 1 tablet (4MG ) by mouth every Monday, Wednesday, Thursday, Friday, Saturday and Sunday evening  Give  tablet (2MG ) by mouth every Tuesday evening   Yes [provider]      PHYSICAL EXAMINATION:   VITAL SIGNS: Blood pressure 121/72, pulse (!) 106, temperature 100.1 F (37.8 C), resp. rate (!) 30, height 5\' 6"  (1.676 m), weight 111.6  kg (246 lb), SpO2 91 %.  GENERAL:  77 y.o.-year-old patient lying in the bed on oxygen via nasal canula EYES: Pupils equal, round, reactive to light and accommodation. No scleral icterus. Extraocular muscles intact.  HEENT: Head atraumatic, normocephalic. Oropharynx and nasopharynx clear.  NECK:  Supple, no jugular venous distention. No thyroid enlargement, no tenderness.  LUNGS: Normal breath sounds bilaterally, no wheezing, rales,rhonchi or crepitation. No use of accessory muscles of respiration.  CARDIOVASCULAR: S1, S2 tachycardia noted. No murmurs, rubs, or gallops.  ABDOMEN: Soft, nontender, nondistended. Bowel sounds present. No organomegaly or mass.  EXTREMITIES: No pedal edema, cyanosis, or clubbing.  Left lower extremity cast NEUROLOGIC: Cranial nerves II through XII are intact. Muscle strength 5/5 in all extremities. Sensation intact. Gait not checked.  PSYCHIATRIC: The patient is alert and oriented x 3.  SKIN: No obvious rash, lesion, or ulcer.   LABORATORY PANEL:   CBC Recent Labs  Lab 03/13/18 1446  WBC 22.6*  HGB 8.6*  HCT 25.9*  PLT 434  MCV 90.1  MCH  29.9  MCHC 33.2  RDW 21.9*  LYMPHSABS 1.0  MONOABS 0.8  EOSABS 0.0  BASOSABS 0.1   ------------------------------------------------------------------------------------------------------------------  Chemistries  Recent Labs  Lab 03/13/18 1446  NA 137  K 5.7*  CL 107  CO2 18*  GLUCOSE 140*  BUN 92*  CREATININE 3.11*  CALCIUM 8.0*  AST 70*  ALT 28  ALKPHOS 137*  BILITOT 0.8   ------------------------------------------------------------------------------------------------------------------ estimated creatinine clearance is 20.1 mL/min (A) (by C-G formula based on SCr of 3.11 mg/dL (H)). ------------------------------------------------------------------------------------------------------------------ No results for input(s): TSH, T4TOTAL, T3FREE, THYROIDAB in the last 72 hours.  Invalid input(s):  FREET3   Coagulation profile Recent Labs  Lab 03/13/18 1446  INR >10.00*   ------------------------------------------------------------------------------------------------------------------- No results for input(s): DDIMER in the last 72 hours. -------------------------------------------------------------------------------------------------------------------  Cardiac Enzymes No results for input(s): CKMB, TROPONINI, MYOGLOBIN in the last 168 hours.  Invalid input(s): CK ------------------------------------------------------------------------------------------------------------------ Invalid input(s): POCBNP  ---------------------------------------------------------------------------------------------------------------  Urinalysis    Component Value Date/Time   COLORURINE AMBER (A) 03/13/2018 1440   APPEARANCEUR TURBID (A) 03/13/2018 1440   APPEARANCEUR Hazy 01/07/2015 1812   LABSPEC 1.011 03/13/2018 1440   LABSPEC 1.014 01/07/2015 1812   PHURINE 7.0 03/13/2018 1440   GLUCOSEU NEGATIVE 03/13/2018 1440   GLUCOSEU Negative 01/07/2015 1812   HGBUR LARGE (A) 03/13/2018 1440   BILIRUBINUR NEGATIVE 03/13/2018 1440   BILIRUBINUR Negative 01/07/2015 1812   KETONESUR NEGATIVE 03/13/2018 1440   PROTEINUR 100 (A) 03/13/2018 1440   NITRITE POSITIVE (A) 03/13/2018 1440   LEUKOCYTESUR LARGE (A) 03/13/2018 1440   LEUKOCYTESUR Negative 01/07/2015 1812     RADIOLOGY: Dg Chest Port 1 View  Result Date: 03/13/2018 CLINICAL DATA:  Weakness.  History of breast and lung cancer. EXAM: PORTABLE CHEST 1 VIEW COMPARISON:  02/04/2018.  01/21/2018.  01/26/2017.  06/06/2016. FINDINGS: Stable cardiomegaly. Chronic interstitial changes are again noted. Low lung volumes. Mild bilateral pleural thickening again noted consistent scarring. Surgical clips left axilla. IMPRESSION: 1.  Stable cardiomegaly.  No pulmonary venous congestion. 2. Chronic interstitial lung disease. Low lung volumes with mild  bibasilar atelectasis. Bilateral pleural thickening again noted consistent scarring similar findings noted on prior studies. Electronically Signed   By: Marcello Moores  Register   On: 03/13/2018 15:02    EKG: Orders placed or performed during the hospital encounter of 03/13/18  . ED EKG 12-Lead  . ED EKG 12-Lead  . EKG 12-Lead  . EKG 12-Lead    IMPRESSION AND PLAN:  77 year old female patient with history of breast cancer, diabetes mellitus type 2, GERD, hypertension, restless leg syndrome, chronic atrial fibrillation, iron deficiency anemia, history of tibial fracture presented to the emergency room from [facility] for fever, confusion and low blood pressure  -Sepsis secondary to urinary tract infection IV fluid hydration Start patient on IV vancomycin and IV cefepime antibiotics Follow-up cultures  -Hypotension secondary to sepsis IV fluids Hold blood pressure medication Follow-up lactic acid  -Coumadin coagulopathy Hold Coumadin Vitamin K given in the emergency room Follow-up INR  -Hematuria secondary to Coumadin coagulopathy Follow-up hemoglobin hematocrit  -Hyperkalemia Follow potassium level  -Acute renal failure Probably secondary to sepsis IV fluid hydration Follow-up renal function  -DVT prophylaxis subcu Lovenox daily  -Chronic atrial fibrillation Monitor patient on telemetry  All the records are reviewed and case discussed with ED provider. Management plans discussed with the patient, family and they are in agreement.  CODE STATUS:Full code Code Status History    Date Active Date Inactive Code Status Order ID Comments User Context  02/13/2018 2026 02/16/2018 1637 Full Code [id number]  Gorden Harms, MD Inpatient   02/02/2018 2159 02/03/2018 2049 Full Code [id number]  Corky Mull, MD Inpatient   10/29/2017 1816 10/31/2017 1816 DNR [id number]  Hillary Bow, MD ED   01/26/2017 2250 01/27/2017 1427 Full Code [id number]  Henreitta Leber, MD ED        TOTAL TIME TAKING CARE OF THIS PATIENT: 56 minutes.    Saundra Shelling M.D on 03/13/2018 at 5:14 PM  Between 7am to 6pm - Pager - [phone]  After 6pm go to [email or web address] - password [email or web address]  Office  [phone]  CC: Primary care physician; Boykin Nearing, MD

## 2018-03-14 ENCOUNTER — Inpatient Hospital Stay: Payer: Self-pay

## 2018-03-14 DIAGNOSIS — N3001 Acute cystitis with hematuria: Secondary | ICD-10-CM

## 2018-03-14 DIAGNOSIS — R6521 Severe sepsis with septic shock: Secondary | ICD-10-CM

## 2018-03-14 DIAGNOSIS — R7881 Bacteremia: Secondary | ICD-10-CM

## 2018-03-14 DIAGNOSIS — E875 Hyperkalemia: Secondary | ICD-10-CM

## 2018-03-14 DIAGNOSIS — A419 Sepsis, unspecified organism: Secondary | ICD-10-CM

## 2018-03-14 LAB — CBC
HCT: 23.7 % — ABNORMAL LOW (ref 35.0–47.0)
Hemoglobin: 7.5 g/dL — ABNORMAL LOW (ref 12.0–16.0)
MCH: 26.2 pg (ref 26.0–34.0)
MCHC: 31.8 g/dL — ABNORMAL LOW (ref 32.0–36.0)
MCV: 82.3 fL (ref 80.0–100.0)
Platelets: 331 10*3/uL (ref 150–440)
RBC: 2.88 MIL/uL — ABNORMAL LOW (ref 3.80–5.20)
RDW: 22.1 % — ABNORMAL HIGH (ref 11.5–14.5)
WBC: 22.5 10*3/uL — ABNORMAL HIGH (ref 3.6–11.0)

## 2018-03-14 LAB — BLOOD CULTURE ID PANEL (REFLEXED)

## 2018-03-14 LAB — PROTIME-INR
INR: 1.93
Prothrombin Time: 21.9 seconds — ABNORMAL HIGH (ref 11.4–15.2)

## 2018-03-14 LAB — TROPONIN I
Troponin I: 0.04 ng/mL (ref <0.03)
Troponin I: 0.03 ng/mL (ref <0.03)

## 2018-03-14 LAB — BASIC METABOLIC PANEL
Anion gap: 9 (ref 5–15)
BUN: 87 mg/dL — ABNORMAL HIGH (ref 6–20)
CO2: 19 mmol/L — ABNORMAL LOW (ref 22–32)
Calcium: 7.4 mg/dL — ABNORMAL LOW (ref 8.9–10.3)
Chloride: 112 mmol/L — ABNORMAL HIGH (ref 101–111)
Creatinine, Ser: 2.4 mg/dL — ABNORMAL HIGH (ref 0.44–1.00)
GFR calc Af Amer: 22 mL/min — ABNORMAL LOW (ref >60)
GFR calc non Af Amer: 19 mL/min — ABNORMAL LOW (ref >60)
Glucose, Bld: 154 mg/dL — ABNORMAL HIGH (ref 65–99)
Potassium: 4.4 mmol/L (ref 3.5–5.1)
Sodium: 140 mmol/L (ref 135–145)

## 2018-03-14 LAB — LACTIC ACID, PLASMA: Lactic Acid, Venous: 1 mmol/L (ref 0.5–1.9)

## 2018-03-14 LAB — PROCALCITONIN: Procalcitonin: 8.22 ng/mL

## 2018-03-14 MED ORDER — PHENYLEPHRINE HCL-NACL 10-0.9 MG/250ML-% IV SOLN
0.0000 ug/min | INTRAVENOUS | Status: DC
  Administered 2018-03-14: 140 ug/min via INTRAVENOUS
  Administered 2018-03-14: 150 ug/min via INTRAVENOUS
  Administered 2018-03-14: 140 ug/min via INTRAVENOUS
  Administered 2018-03-14: 120 ug/min via INTRAVENOUS
  Administered 2018-03-14: 20 ug/min via INTRAVENOUS
  Filled 2018-03-14 (×8): qty 250

## 2018-03-14 MED ORDER — IPRATROPIUM-ALBUTEROL 0.5-2.5 (3) MG/3ML IN SOLN
3.0000 mL | Freq: Four times a day (QID) | RESPIRATORY_TRACT | Status: DC
  Administered 2018-03-15 – 2018-03-20 (×19): 3 mL via RESPIRATORY_TRACT
  Filled 2018-03-14 (×20): qty 3

## 2018-03-14 MED ORDER — CIPROFLOXACIN IN D5W 200 MG/100ML IV SOLN
200.0000 mg | INTRAVENOUS | Status: DC
  Administered 2018-03-14 – 2018-03-15 (×2): 200 mg via INTRAVENOUS
  Filled 2018-03-14 (×3): qty 100

## 2018-03-14 MED ORDER — PHENYLEPHRINE HCL 10 MG/ML IJ SOLN
0.0000 ug/min | INTRAMUSCULAR | Status: DC
  Administered 2018-03-14: 150 ug/min via INTRAVENOUS
  Administered 2018-03-15: 140 ug/min via INTRAVENOUS
  Filled 2018-03-14 (×3): qty 4

## 2018-03-14 NOTE — Progress Notes (Signed)
PULMONARY / CRITICAL CARE MEDICINE   Name: Tracy Dickerson MRN: [id number] DOB: [birth year]    ADMISSION DATE:  03/13/2018   CONSULTATION DATE:  03/13/2018  REFERRING MD: Dr. Estanislado Pandy  Reason: Septic shock  HISTORY OF PRESENT ILLNESS:   This is a 77 year old SNF patient who presented to the ED from [facility] with complaints of weakness, fever and tachycardia.  Weakness have progressively gotten worse over the course of a week and today patient spiked a fever of 102 F hence EMS was called.  Upon EMS arrival, patient was on oxygen with a stable SPO2 but complained of shortness of breath and diffuse abdominal pain and weakness.  At the ED, she was brought in for sepsis.  She had WBC was 22 . 6K, hemoglobin of 8.6 INR greater than 10, creatinine of 3.1, BUN of 92 and a urinalysis that is positive for nitrites, leukocyte esterase bacteria and WBCs.  She has been given vitamin K, IV fluids and started on pressors.  She is being admitted to the ICU for management of urosepsis and septic shock. Prior to ED presentation, patient was seen 2 weeks ago by urology for urinary retention.  At the ED her bladder scan revealed more than 849 mL sensitive Foley catheter was placed and 850 cc of purulent urine was drained.   REVIEW OF SYSTEMS:   Constitutional: Positive for fever and chills.  HENT: Negative for congestion and rhinorrhea.  Eyes: Negative for redness and visual disturbance.  Respiratory: no cough Cardiovascular: Negative for chest pain and palpitations.  Gastrointestinal: Negative  for nausea , vomiting  Genitourinary: Positive for dysuria and urgency.  Endocrine: Denies polyuria, polyphagia and heat intolerance Musculoskeletal: Negative for myalgias and arthralgias.  Skin: Negative for pallor and wound.  Neurological: Negative for dizziness and headaches   SUBJECTIVE:  Patient reported that she felt better but needed a laxative for a BM VITAL SIGNS: BP (!) 121/50   Pulse 69    Temp (!) 97.3 F (36.3 C)   Resp 17   Ht 5\' 6"  (1.676 m)   Wt 246 lb (111.6 kg)   SpO2 97%   BMI 39.71 kg/m   HEMODYNAMICS:   Hypotensive on vasopressor  OXYGEN: 2 liters [place]   INTAKE / OUTPUT: I/O last 3 completed shifts: In: 2552.8 [P.O.:480; I.V.:2072.8] Out: 1575 [Urine:1575]  PHYSICAL EXAMINATION: General: comfortable in bed Neuro: Alert and oriented x3, moves all extremities, follows commands HEENT: PERRLA, no JVD, trachea midline Cardiovascular: Apical pulse tachycardic, S1-S2, no murmur regurg or gallop, +2 pulses Lungs: Bilateral breath sounds, diminished in the bases, no wheezes or rhonchi Abdomen:  normal bowel sounds, non tender Musculoskeletal: Positive range of motion in upper and lower extremities Skin: Warm and dry  LABS:  BMET Recent Labs  Lab 03/13/18 1446 03/13/18 2210 03/14/18 0334  NA 137 139 140  K 5.7* 4.9 4.4  CL 107 110 112*  CO2 18* 19* 19*  BUN 92* 91* 87*  CREATININE 3.11* 2.73* 2.40*  GLUCOSE 140* 152* 154*    Electrolytes Recent Labs  Lab 03/13/18 1446 03/13/18 2210 03/14/18 0334  CALCIUM 8.0* 7.4* 7.4*  MG  --  2.7*  --   PHOS  --  4.3  --     CBC Recent Labs  Lab 03/13/18 1446 03/13/18 2210 03/14/18 0334  WBC 22.6* 27.1* 22.5*  HGB 8.6* 8.5* 7.5*  HCT 25.9* 26.8* 23.7*  PLT 434 396 331    Coag's Recent Labs  Lab 03/13/18 1446 03/13/18  2210 03/14/18 0334  INR >10.00* 2.61 1.93    Sepsis Markers Recent Labs  Lab 03/13/18 1427 03/13/18 2209 03/14/18 0334 03/14/18 0941  LATICACIDVEN 1.9 1.7 1.0  --   PROCALCITON  --   --   --  8.22    ABG No results for input(s): PHART, PCO2ART, PO2ART in the last 168 hours.  Liver Enzymes Recent Labs  Lab 03/13/18 1446 03/13/18 2210  AST 70* 69*  ALT 28 28  ALKPHOS 137* 153*  BILITOT 0.8 1.0  ALBUMIN 1.9* 2.0*    Cardiac Enzymes Recent Labs  Lab 03/13/18 2210 03/14/18 0334 03/14/18 0941  TROPONINI 0.06* 0.04* 0.03*    Glucose Recent Labs   Lab 03/13/18 2117  GLUCAP 144*    Imaging Dg Abd 1 View  Result Date: 03/13/2018 CLINICAL DATA:  Abdominal pain. EXAM: ABDOMEN - 1 VIEW COMPARISON:  Abdominal radiograph Feb 04, 2018 FINDINGS: The bowel gas pattern is normal though, paucity of small bowel gas. No radio-opaque calculi or other significant radiographic abnormality are seen. Catheter projects in the pelvis. Follicle is project in the pelvis. IMPRESSION: Nonspecific bowel gas pattern. Electronically Signed   By: Elon Alas M.D.   On: 03/13/2018 22:41   [place] Ekg Site Rite  Result Date: 03/14/2018 If Site Rite image not attached, placement could not be confirmed due to current cardiac rhythm.  CULTURES: Blood cultures x2 Urine cultures  ANTIBIOTICS: Comycin Cefepime  SIGNIFICANT EVENTS: 03/13/18: Admitted  LINES/TUBES: Peripheral IVs Foley  DISCUSSION: 77 year old female with frequent urinary retention admitted with septic shock, sepsis secondary to UTI and acute urinary retention  ASSESSMENT  Severe Sepsis with shock secondary to Pseudomonas bacteremia UTI with hematuria Coumadin induced coagulopathy-INR greater than 10 Hematuria secondary to coagulopathy and severe UTI Acute on chronic renal failure/ AKI Acute on chronic anemia-hemoglobin trending down Chronic atrial fibrillation on Coumadin Hyperkalemia has resolved   PLAN  Hemodynamic monitoring per ICU protocol IV fluids and vasopressors support to maintain MAP > 65 Will double cover with Cefepime and Cipro  Maintain Foley in place Trend creatinine and electrolytes Trend procalcitonin adjust antibiotics No pharmacologic DVT prophylaxis SCDs for DVT prophylaxis  FAMILY  - Updates: No family at bedside.  Will update when available Patient remains critically ill.  I have dedicated a total of 38 minutes in critical care time minus all appropriate exclusions.  Cammie Sickle, MD Pulmonary and [place] Pager [phone] or [phone]    03/14/2018, 9:55 PM

## 2018-03-14 NOTE — Progress Notes (Signed)
Patient ID: Tracy Dickerson, female   DOB: 17-Aug-1941, 77 y.o.   MRN: [id number]  [employer] [facility] PROGRESS NOTE  Tracy Dickerson GMW:[id number] DOB: 03-Dec-1940 DOA: 03/13/2018 PCP: Boykin Nearing, MD  HPI/Subjective: Patient awakened from sleep.  States she feels weak.  Some lower abdominal discomfort.  Objective: Vitals:   03/14/18 0700 03/14/18 0800  BP: (!) 86/54 (!) 93/48  Pulse: 88 99  Resp: (!) 21 (!) 23  Temp: (!) 97.3 F (36.3 C) (!) 97 F (36.1 C)  SpO2: 94% 98%    Intake/Output Summary (Last 24 hours) at 03/14/2018 1322 Last data filed at 03/14/2018 0900 Gross per 24 hour  Intake 1276.77 ml  Output 1150 ml  Net 126.77 ml   Filed Weights   03/13/18 1423  Weight: 111.6 kg (246 lb)    ROS: Review of Systems  Constitutional: Negative for chills and fever.  Eyes: Negative for blurred vision.  Respiratory: Negative for cough and shortness of breath.   Cardiovascular: Negative for chest pain.  Gastrointestinal: Positive for abdominal pain. Negative for constipation, diarrhea, nausea and vomiting.  Genitourinary: Positive for dysuria.  Musculoskeletal: Negative for joint pain.  Neurological: Negative for dizziness and headaches.   Exam: Physical Exam  HENT:  Nose: No mucosal edema.  Mouth/Throat: No oropharyngeal exudate or posterior oropharyngeal edema.  Eyes: Pupils are equal, round, and reactive to light. Conjunctivae, EOM and lids are normal.  Neck: No JVD present. Carotid bruit is not present. No edema present. No thyroid mass and no thyromegaly present.  Cardiovascular: S1 normal and S2 normal. Exam reveals no gallop.  No murmur heard. Pulses:      Dorsalis pedis pulses are 2+ on the right side, and 2+ on the left side.  Respiratory: No respiratory distress. She has no wheezes. She has no rhonchi. She has no rales.  GI: Soft. Bowel sounds are normal. There is no tenderness.  Musculoskeletal:       Right ankle: She exhibits swelling.       Left ankle:  She exhibits swelling.  Cast on left foot.  Lymphadenopathy:    She has no cervical adenopathy.  Neurological: She is alert. No cranial nerve deficit.  Skin: Skin is warm. No rash noted. Nails show no clubbing.  Psychiatric: She has a normal mood and affect.      Data Reviewed: Basic Metabolic Panel: Recent Labs  Lab 03/13/18 1446 03/13/18 2210 03/14/18 0334  NA 137 139 140  K 5.7* 4.9 4.4  CL 107 110 112*  CO2 18* 19* 19*  GLUCOSE 140* 152* 154*  BUN 92* 91* 87*  CREATININE 3.11* 2.73* 2.40*  CALCIUM 8.0* 7.4* 7.4*  MG  --  2.7*  --   PHOS  --  4.3  --    Liver Function Tests: Recent Labs  Lab 03/13/18 1446 03/13/18 2210  AST 70* 69*  ALT 28 28  ALKPHOS 137* 153*  BILITOT 0.8 1.0  PROT 7.2 7.4  ALBUMIN 1.9* 2.0*   CBC: Recent Labs  Lab 03/13/18 1446 03/13/18 2210 03/14/18 0334  WBC 22.6* 27.1* 22.5*  NEUTROABS 20.7*  --   --   HGB 8.6* 8.5* 7.5*  HCT 25.9* 26.8* 23.7*  MCV 90.1 82.8 82.3  PLT 434 396 331   Cardiac Enzymes: Recent Labs  Lab 03/13/18 2210 03/14/18 0334 03/14/18 0941  TROPONINI 0.06* 0.04* 0.03*    CBG: Recent Labs  Lab 03/13/18 2117  GLUCAP 144*    Recent Results (from the past 240  hour(s))  Blood Culture (routine x 2)     Status: None (Preliminary result)   Collection Time: 03/13/18  2:27 PM  Result Value Ref Range Status   Specimen Description BLOOD R HAND  Final   Special Requests   Final    BOTTLES DRAWN AEROBIC AND ANAEROBIC Blood Culture adequate volume   Culture  Setup Time   Final    GRAM NEGATIVE RODS AEROBIC BOTTLE ONLY CRITICAL RESULT CALLED TO, READ BACK BY AND VERIFIED WITH: Tracy Dickerson ON 03/14/18 AT 0850 QSD Performed at [facility], [address] [place], [place] [address]    Culture GRAM NEGATIVE RODS  Final   Report Status PENDING  Incomplete  Blood Culture (routine x 2)     Status: None (Preliminary result)   Collection Time: 03/13/18  2:27 PM  Result Value Ref Range Status    Specimen Description BLOOD L HAND  Final   Special Requests   Final    BOTTLES DRAWN AEROBIC AND ANAEROBIC Blood Culture adequate volume   Culture  Setup Time   Final    Organism ID to follow GRAM NEGATIVE RODS AEROBIC BOTTLE ONLY CRITICAL RESULT CALLED TO, READ BACK BY AND VERIFIED WITH: Tracy Dickerson ON 03/14/18 AT 0850 QSD Performed at [facility], [address] [place], [place] [address]    Culture GRAM NEGATIVE RODS  Final   Report Status PENDING  Incomplete  Blood Culture ID Panel (Reflexed)     Status: Abnormal   Collection Time: 03/13/18  2:27 PM  Result Value Ref Range Status   Enterococcus species NOT DETECTED NOT DETECTED Final   Listeria monocytogenes NOT DETECTED NOT DETECTED Final   Staphylococcus species NOT DETECTED NOT DETECTED Final   Staphylococcus aureus NOT DETECTED NOT DETECTED Final   Streptococcus species NOT DETECTED NOT DETECTED Final   Streptococcus agalactiae NOT DETECTED NOT DETECTED Final   Streptococcus pneumoniae NOT DETECTED NOT DETECTED Final   Streptococcus pyogenes NOT DETECTED NOT DETECTED Final   Acinetobacter baumannii NOT DETECTED NOT DETECTED Final   Enterobacteriaceae species NOT DETECTED NOT DETECTED Final   Enterobacter cloacae complex NOT DETECTED NOT DETECTED Final   Escherichia coli NOT DETECTED NOT DETECTED Final   Klebsiella oxytoca NOT DETECTED NOT DETECTED Final   Klebsiella pneumoniae NOT DETECTED NOT DETECTED Final   Proteus species NOT DETECTED NOT DETECTED Final   Serratia marcescens NOT DETECTED NOT DETECTED Final   Carbapenem resistance NOT DETECTED NOT DETECTED Final   Haemophilus influenzae NOT DETECTED NOT DETECTED Final   Neisseria meningitidis NOT DETECTED NOT DETECTED Final   Pseudomonas aeruginosa DETECTED (A) NOT DETECTED Final    Comment: CRITICAL RESULT CALLED TO, READ BACK BY AND VERIFIED WITH: Tracy Dickerson ON 03/14/18 AT 0850 QSD    Candida albicans NOT DETECTED NOT DETECTED Final   Candida glabrata  NOT DETECTED NOT DETECTED Final   Candida krusei NOT DETECTED NOT DETECTED Final   Candida parapsilosis NOT DETECTED NOT DETECTED Final   Candida tropicalis NOT DETECTED NOT DETECTED Final    Comment: Performed at [facility], [address] [place],  [address]  MRSA PCR Screening     Status: None   Collection Time: 03/13/18  9:27 PM  Result Value Ref Range Status   MRSA by PCR NEGATIVE NEGATIVE Final    Comment:        The GeneXpert MRSA Assay (FDA approved for NASAL specimens only), is one component of a comprehensive MRSA colonization surveillance program. It is  not intended to diagnose MRSA infection nor to guide or monitor treatment for MRSA infections. Performed at [facility], [address] [place], [place] [address]      Studies: Dg Abd 1 View  Result Date: 03/13/2018 CLINICAL DATA:  Abdominal pain. EXAM: ABDOMEN - 1 VIEW COMPARISON:  Abdominal radiograph Feb 04, 2018 FINDINGS: The bowel gas pattern is normal though, paucity of small bowel gas. No radio-opaque calculi or other significant radiographic abnormality are seen. Catheter projects in the pelvis. Follicle is project in the pelvis. IMPRESSION: Nonspecific bowel gas pattern. Electronically Signed   By: Elon Alas M.D.   On: 03/13/2018 22:41   Dg Chest Port 1 View  Result Date: 03/13/2018 CLINICAL DATA:  Weakness.  History of breast and lung cancer. EXAM: PORTABLE CHEST 1 VIEW COMPARISON:  02/04/2018.  01/21/2018.  01/26/2017.  06/06/2016. FINDINGS: Stable cardiomegaly. Chronic interstitial changes are again noted. Low lung volumes. Mild bilateral pleural thickening again noted consistent scarring. Surgical clips left axilla. IMPRESSION: 1.  Stable cardiomegaly.  No pulmonary venous congestion. 2. Chronic interstitial lung disease. Low lung volumes with mild bibasilar atelectasis. Bilateral pleural thickening again noted consistent scarring similar findings noted on prior studies.  Electronically Signed   By: Marcello Moores  Register   On: 03/13/2018 15:02   [place] Ekg Site Rite  Result Date: 03/14/2018 If Site Rite image not attached, placement could not be confirmed due to current cardiac rhythm.   Scheduled Meds: . atorvastatin  80 mg Oral Daily  . ferrous sulfate  325 mg Oral Daily  . gabapentin  300 mg Oral QHS  . pantoprazole  40 mg Oral Daily  . pramipexole  0.25 mg Oral QHS   Continuous Infusions: . sodium chloride 125 mL/hr at 03/13/18 2230  . ceFEPime (MAXIPIME) IV    . phenylephrine (NEO-SYNEPHRINE) Adult infusion 30 mcg/min (03/14/18 0947)    Assessment/Plan:  1. Septic shock with Pseudomonas.  Sources acute cystitis with hematuria.  Patient was sent home last time with Foley catheter   For urinary retention.  Patient on Neo-Synephrine to maintain blood pressure.  Cefepime antibiotic.  Hold antihypertensive medications.  Patient is critically ill. 2. Acute kidney injury secondary to septic shock.  IV fluid hydration. 3. Hyperlipidemia unspecified on atorvastatin 4.  diabetic neuropathy on gabapentin 5. GERD on Protonix 6. Restless leg syndrome on Mirapex 7. Obesity.  Weight loss needed. 8. Atrial fibrillation with RVR just popped up on last vital sign.  May need amiodarone drip secondary to septic shock.  Code Status:     Code Status Orders  (From admission, onward)        Start     Ordered   03/13/18 2021  Full code  Continuous     03/13/18 2020    Code Status History    Date Active Date Inactive Code Status Order ID Comments User Context   02/13/2018 2026 02/16/2018 1637 Full Code [id number]  Gorden Harms, MD Inpatient   02/02/2018 2159 02/03/2018 2049 Full Code [id number]  Corky Mull, MD Inpatient   10/29/2017 1816 10/31/2017 1816 DNR [id number]  Hillary Bow, MD ED   01/26/2017 2250 01/27/2017 1427 Full Code [id number]  Henreitta Leber, MD ED     Family Communication: As per critical care specialist Disposition Plan: To be  determined  Consultants:  Critical care specialist  Antibiotics:  Cefepime  Time spent: 28 minutes  [place]

## 2018-03-14 NOTE — Progress Notes (Signed)
PHARMACY - PHYSICIAN COMMUNICATION CRITICAL VALUE ALERT - BLOOD CULTURE IDENTIFICATION (BCID)  Tracy Dickerson is an 77 y.o. female who presented to [facility] on 03/13/2018 with a chief complaint of fever.  Assessment:  Empirically treating UTI/sepsis, Blood culture with GNR in the aerobic bottle of both sets, BCID shows Pseudomonas aeruginosa  Name of physician (or Provider) Contacted: Dr. Cammie Sickle  Current antibiotics: Cefepime and Vancomycin  Changes to prescribed antibiotics recommended:  Recommendations accepted by provider - will continue with Cefepime but Vancomycin can be discontinued  No results found for this or any previous visit.  Paulina Fusi, PharmD, BCPS 03/14/2018 9:10 AM

## 2018-03-14 NOTE — Consult Note (Signed)
PULMONARY / CRITICAL CARE MEDICINE   Name: Tracy Dickerson MRN: [id number] DOB: 1941/05/28    ADMISSION DATE:  03/13/2018   CONSULTATION DATE:  03/13/2018  REFERRING MD: Dr. Estanislado Pandy  Reason: Septic shock  HISTORY OF PRESENT ILLNESS:   This is a 77 year old SNF patient who presented to the ED from [facility] with complaints of weakness, fever and tachycardia.  Weakness have progressively gotten worse over the course of a week and today patient spiked a fever of 102 F hence EMS was called.  Upon EMS arrival, patient was on oxygen with a stable SPO2 but complained of shortness of breath and diffuse abdominal pain and weakness.  At the ED, she was brought in for sepsis.  She had WBC was 22 . 6K, hemoglobin of 8.6 INR greater than 10, creatinine of 3.1, BUN of 92 and a urinalysis that is positive for nitrites, leukocyte esterase bacteria and WBCs.  She has been given vitamin K, IV fluids and started on pressors.  She is being admitted to the ICU for management of urosepsis and septic shock. Prior to ED presentation, patient was seen 2 weeks ago by urology for urinary retention.  At the ED her bladder scan revealed more than 849 mL sensitive Foley catheter was placed and 850 cc of purulent urine was drained.   PAST MEDICAL HISTORY :  She  has a past medical history of Cancer ([place]), Diabetes mellitus without complication ([place]), GERD (gastroesophageal reflux disease), Heart disease, and Hypertension.  PAST SURGICAL HISTORY: She  has a past surgical history that includes Abdominal hysterectomy; Appendectomy; Tonsillectomy; Open reduction internal fixation (orif) tibia/fibula fracture (Left, 02/03/2018); and Breast surgery.  Allergies  Allergen Reactions  . Oxycodone   . Percocet [Oxycodone-Acetaminophen] Itching  . Penicillins Rash    Has patient had a PCN reaction causing immediate rash, facial/tongue/throat swelling, SOB or lightheadedness with hypotension: Yes Has patient had a PCN  reaction causing severe rash involving mucus membranes or skin necrosis: Unknown Has patient had a PCN reaction that required hospitalization: Unknown Has patient had a PCN reaction occurring within the last 10 years: No If all of the above answers are "NO", then may proceed with Cephalosporin use.    No current facility-administered medications on file prior to encounter.    Current Outpatient Medications on File Prior to Encounter  Medication Sig  . ADVAIR DISKUS 250-50 MCG/DOSE AEPB Inhale 1 puff into the lungs 2 (two) times daily.  Marland Kitchen albuterol (PROAIR HFA) 108 (90 Base) MCG/ACT inhaler Inhale 2 puffs into the lungs every 4 (four) hours as needed.  Marland Kitchen atorvastatin (LIPITOR) 80 MG tablet Take 80 mg by mouth daily.  . benzonatate (TESSALON PERLES) 100 MG capsule Take 1 capsule (100 mg total) by mouth every 6 (six) hours as needed for cough.  . bisacodyl (DULCOLAX) 10 MG suppository Place 1 suppository (10 mg total) rectally daily as needed for moderate constipation.  . ferrous sulfate (SLOW FE) 160 (50 Fe) MG TBCR SR tablet Take 160 mg by mouth daily.   Marland Kitchen gabapentin (NEURONTIN) 300 MG capsule Take 1 capsule (300 mg total) by mouth at bedtime.  . metFORMIN (GLUCOPHAGE) 500 MG tablet Take 1 tablet (500 mg total) by mouth 2 (two) times daily.  . metoprolol tartrate (LOPRESSOR) 25 MG tablet Take 0.5 tablets (12.5 mg total) by mouth 2 (two) times daily.  Marland Kitchen omeprazole (PRILOSEC) 40 MG capsule Take 40 mg by mouth daily.  . polyethylene glycol (MIRALAX / GLYCOLAX) packet Take 17 g  by mouth daily as needed for mild constipation.  . pramipexole (MIRAPEX) 0.25 MG tablet Take 0.25 mg by mouth at bedtime.  Marland Kitchen SPIRIVA HANDIHALER 18 MCG inhalation capsule Place 1 capsule into inhaler and inhale daily.  . traMADol (ULTRAM) 50 MG tablet Take 1 tablet (50 mg total) by mouth every 6 (six) hours as needed for moderate pain or severe pain.  Marland Kitchen warfarin (COUMADIN) 4 MG tablet Give 1 tablet (4MG ) by mouth every  Monday, Wednesday, Thursday, Friday, Saturday and Sunday evening  Give  tablet (2MG ) by mouth every Tuesday evening    FAMILY HISTORY:  Her indicated that her mother is deceased. She indicated that her father is deceased.   SOCIAL HISTORY: She  reports that she has quit smoking. She has never used smokeless tobacco. She reports that she drinks alcohol. She reports that she does not use drugs.  REVIEW OF SYSTEMS:   Constitutional: Positive for fever and chills.  HENT: Negative for congestion and rhinorrhea.  Eyes: Negative for redness and visual disturbance.  Respiratory: Positive for shortness of breath and cough Cardiovascular: Negative for chest pain and palpitations.  Gastrointestinal: Negative  for nausea , vomiting but positive for abdominal pain  Genitourinary: Positive for dysuria and urgency.  Endocrine: Denies polyuria, polyphagia and heat intolerance Musculoskeletal: Negative for myalgias and arthralgias.  Skin: Negative for pallor and wound.  Neurological: Negative for dizziness and headaches   SUBJECTIVE:   VITAL SIGNS: BP (!) 93/48 (BP Location: Left Arm)   Pulse 99   Temp (!) 97 F (36.1 C) (Bladder)   Resp (!) 23   Ht 5\' 6"  (1.676 m)   Wt 246 lb (111.6 kg)   SpO2 98%   BMI 39.71 kg/m   HEMODYNAMICS:    VENTILATOR SETTINGS:    INTAKE / OUTPUT: I/O last 3 completed shifts: In: 1019.4 [I.V.:1019.4] Out: 1150 [Urine:1150]  PHYSICAL EXAMINATION: General: Acutely ill looking Neuro: Alert and oriented x3, moves all extremities, follows commands HEENT: PERRLA, no JVD, trachea midline Cardiovascular: Apical pulse tachycardic, S1-S2, no murmur regurg or gallop, +2 pulses Lungs: Bilateral breath sounds, diminished in the bases, no wheezes or rhonchi Abdomen: Distended, normal bowel sounds in all 4 quadrants, palpation reveals diffuse tenderness Musculoskeletal: Positive range of motion in upper and lower extremities Skin: Warm and  dry  LABS:  BMET Recent Labs  Lab 03/13/18 1446 03/13/18 2210 03/14/18 0334  NA 137 139 140  K 5.7* 4.9 4.4  CL 107 110 112*  CO2 18* 19* 19*  BUN 92* 91* 87*  CREATININE 3.11* 2.73* 2.40*  GLUCOSE 140* 152* 154*    Electrolytes Recent Labs  Lab 03/13/18 1446 03/13/18 2210 03/14/18 0334  CALCIUM 8.0* 7.4* 7.4*  MG  --  2.7*  --   PHOS  --  4.3  --     CBC Recent Labs  Lab 03/13/18 1446 03/13/18 2210 03/14/18 0334  WBC 22.6* 27.1* 22.5*  HGB 8.6* 8.5* 7.5*  HCT 25.9* 26.8* 23.7*  PLT 434 396 331    Coag's Recent Labs  Lab 03/13/18 1446 03/13/18 2210 03/14/18 0334  INR >10.00* 2.61 1.93    Sepsis Markers Recent Labs  Lab 03/13/18 1427 03/13/18 2209 03/14/18 0334  LATICACIDVEN 1.9 1.7 1.0    ABG No results for input(s): PHART, PCO2ART, PO2ART in the last 168 hours.  Liver Enzymes Recent Labs  Lab 03/13/18 1446 03/13/18 2210  AST 70* 69*  ALT 28 28  ALKPHOS 137* 153*  BILITOT 0.8 1.0  ALBUMIN  1.9* 2.0*    Cardiac Enzymes Recent Labs  Lab 03/13/18 2210 03/14/18 0334  TROPONINI 0.06* 0.04*    Glucose Recent Labs  Lab 03/13/18 2117  GLUCAP 144*    Imaging Dg Abd 1 View  Result Date: 03/13/2018 CLINICAL DATA:  Abdominal pain. EXAM: ABDOMEN - 1 VIEW COMPARISON:  Abdominal radiograph Feb 04, 2018 FINDINGS: The bowel gas pattern is normal though, paucity of small bowel gas. No radio-opaque calculi or other significant radiographic abnormality are seen. Catheter projects in the pelvis. Follicle is project in the pelvis. IMPRESSION: Nonspecific bowel gas pattern. Electronically Signed   By: Elon Alas M.D.   On: 03/13/2018 22:41   Dg Chest Port 1 View  Result Date: 03/13/2018 CLINICAL DATA:  Weakness.  History of breast and lung cancer. EXAM: PORTABLE CHEST 1 VIEW COMPARISON:  02/04/2018.  01/21/2018.  01/26/2017.  06/06/2016. FINDINGS: Stable cardiomegaly. Chronic interstitial changes are again noted. Low lung volumes. Mild  bilateral pleural thickening again noted consistent scarring. Surgical clips left axilla. IMPRESSION: 1.  Stable cardiomegaly.  No pulmonary venous congestion. 2. Chronic interstitial lung disease. Low lung volumes with mild bibasilar atelectasis. Bilateral pleural thickening again noted consistent scarring similar findings noted on prior studies. Electronically Signed   By: Marcello Moores  Register   On: 03/13/2018 15:02   CULTURES: Blood cultures x2 Urine cultures  ANTIBIOTICS: Comycin Cefepime  SIGNIFICANT EVENTS: 03/13/18: Admitted  LINES/TUBES: Peripheral IVs Foley  DISCUSSION: 77 year old female with frequent urinary retention admitted with septic shock, sepsis secondary to UTI and acute urinary retention  ASSESSMENT  Septic shock Urosepsis Coumadin induced coagulopathy-INR greater than 10 Hematuria secondary to coagulopathy and severe UTI Acute on chronic renal failure Acute on chronic anemia-hemoglobin trending down Chronic atrial fibrillation on Coumadin Hyperkalemia  PLAN  Hemodynamic monitoring per ICU protocol IV fluids and pressors to maintain mean arterial blood pressure greater than 65 IV antibiotics as above Discontinue Coumadin Vitamin K already given in the ED Type and screen Monitor I's and O's Urology consult for recurrent urinary retention Maintain Foley in place Trend creatinine and electrolytes Trend procalcitonin adjust antibiotics No pharmacologic DVT prophylaxis SCDs for DVT prophylaxis No indication for GI prophylaxis  FAMILY  - Updates: No family at bedside.  Will update when available  - Inter-disciplinary family meet or Palliative Care meeting due by: day [place]. [facility] ANP-BC Pulmonary and Critical Care Medicine [facility] Pager [phone] or [phone]  NB: This document was prepared using Dragon voice recognition software and may include unintentional dictation errors.   03/14/2018, 10:35 AM

## 2018-03-15 ENCOUNTER — Inpatient Hospital Stay: Payer: Medicare Other

## 2018-03-15 DIAGNOSIS — R338 Other retention of urine: Secondary | ICD-10-CM

## 2018-03-15 DIAGNOSIS — N390 Urinary tract infection, site not specified: Secondary | ICD-10-CM

## 2018-03-15 LAB — CBC WITH DIFFERENTIAL/PLATELET
Basophils Absolute: 0 10*3/uL (ref 0–0.1)
Basophils Relative: 0 %
Eosinophils Absolute: 0.3 10*3/uL (ref 0–0.7)
Eosinophils Relative: 1 %
HCT: 25.2 % — ABNORMAL LOW (ref 35.0–47.0)
Hemoglobin: 7.8 g/dL — ABNORMAL LOW (ref 12.0–16.0)
Lymphocytes Relative: 7 %
Lymphs Abs: 1.3 10*3/uL (ref 1.0–3.6)
MCH: 25.5 pg — ABNORMAL LOW (ref 26.0–34.0)
MCHC: 31.1 g/dL — ABNORMAL LOW (ref 32.0–36.0)
MCV: 82 fL (ref 80.0–100.0)
Monocytes Absolute: 0.7 10*3/uL (ref 0.2–0.9)
Monocytes Relative: 4 %
Neutro Abs: 17.3 10*3/uL — ABNORMAL HIGH (ref 1.4–6.5)
Neutrophils Relative %: 88 %
Platelets: 392 10*3/uL (ref 150–440)
RBC: 3.07 MIL/uL — ABNORMAL LOW (ref 3.80–5.20)
RDW: 22.9 % — ABNORMAL HIGH (ref 11.5–14.5)
WBC: 19.6 10*3/uL — ABNORMAL HIGH (ref 3.6–11.0)

## 2018-03-15 LAB — HEPATIC FUNCTION PANEL
ALT: 17 U/L (ref 14–54)
AST: 41 U/L (ref 15–41)
Albumin: 1.2 g/dL — ABNORMAL LOW (ref 3.5–5.0)
Alkaline Phosphatase: 106 U/L (ref 38–126)
Bilirubin, Direct: 0.2 mg/dL (ref 0.1–0.5)
Indirect Bilirubin: 0.4 mg/dL (ref 0.3–0.9)
Total Bilirubin: 0.6 mg/dL (ref 0.3–1.2)
Total Protein: 5.7 g/dL — ABNORMAL LOW (ref 6.5–8.1)

## 2018-03-15 LAB — RENAL FUNCTION PANEL
Albumin: 1.6 g/dL — ABNORMAL LOW (ref 3.5–5.0)
Anion gap: 6 (ref 5–15)
BUN: 75 mg/dL — ABNORMAL HIGH (ref 6–20)
CO2: 17 mmol/L — ABNORMAL LOW (ref 22–32)
Calcium: 7.3 mg/dL — ABNORMAL LOW (ref 8.9–10.3)
Chloride: 117 mmol/L — ABNORMAL HIGH (ref 101–111)
Creatinine, Ser: 1.74 mg/dL — ABNORMAL HIGH (ref 0.44–1.00)
GFR calc Af Amer: 32 mL/min — ABNORMAL LOW (ref >60)
GFR calc non Af Amer: 28 mL/min — ABNORMAL LOW (ref >60)
Glucose, Bld: 118 mg/dL — ABNORMAL HIGH (ref 65–99)
Phosphorus: 4.4 mg/dL (ref 2.5–4.6)
Potassium: 4.3 mmol/L (ref 3.5–5.1)
Sodium: 140 mmol/L (ref 135–145)

## 2018-03-15 LAB — GLUCOSE, CAPILLARY
Glucose-Capillary: 161 mg/dL — ABNORMAL HIGH (ref 65–99)
Glucose-Capillary: 162 mg/dL — ABNORMAL HIGH (ref 65–99)
Glucose-Capillary: 163 mg/dL — ABNORMAL HIGH (ref 65–99)
Glucose-Capillary: 167 mg/dL — ABNORMAL HIGH (ref 65–99)

## 2018-03-15 LAB — PROCALCITONIN: Procalcitonin: 5.46 ng/mL

## 2018-03-15 MED ORDER — GI COCKTAIL ~~LOC~~: 30.0000 mL | Freq: Once | ORAL | Status: DC

## 2018-03-15 MED ORDER — SODIUM CHLORIDE 0.9 % IV SOLN
2.0000 g | Freq: Two times a day (BID) | INTRAVENOUS | Status: DC
  Administered 2018-03-15 – 2018-03-16 (×3): 2 g via INTRAVENOUS
  Filled 2018-03-15 (×4): qty 2

## 2018-03-15 MED ORDER — INSULIN ASPART 100 UNIT/ML ~~LOC~~ SOLN
0.0000 [IU] | SUBCUTANEOUS | Status: DC
  Administered 2018-03-15 (×3): 2 [IU] via SUBCUTANEOUS
  Administered 2018-03-16 (×2): 1 [IU] via SUBCUTANEOUS
  Administered 2018-03-16 (×2): 2 [IU] via SUBCUTANEOUS
  Administered 2018-03-16: 1 [IU] via SUBCUTANEOUS
  Administered 2018-03-16: 21:00:00 3 [IU] via SUBCUTANEOUS
  Administered 2018-03-17: 04:00:00 1 [IU] via SUBCUTANEOUS
  Administered 2018-03-17 (×2): 2 [IU] via SUBCUTANEOUS
  Administered 2018-03-17: 1 [IU] via SUBCUTANEOUS
  Administered 2018-03-17: 2 [IU] via SUBCUTANEOUS
  Administered 2018-03-18: 09:00:00 1 [IU] via SUBCUTANEOUS
  Administered 2018-03-18: 2 [IU] via SUBCUTANEOUS
  Filled 2018-03-15 (×17): qty 1

## 2018-03-15 MED ORDER — TRAMADOL HCL 50 MG PO TABS
50.0000 mg | ORAL_TABLET | Freq: Once | ORAL | Status: AC
  Administered 2018-03-15: 50 mg via ORAL
  Filled 2018-03-15: qty 1

## 2018-03-15 MED ORDER — GI COCKTAIL ~~LOC~~
30.0000 mL | Freq: Once | ORAL | Status: AC
  Administered 2018-03-15: 30 mL via ORAL
  Filled 2018-03-15: qty 30

## 2018-03-15 MED ORDER — HYDROCORTISONE NA SUCCINATE PF 100 MG IJ SOLR
50.0000 mg | Freq: Three times a day (TID) | INTRAMUSCULAR | Status: DC
  Administered 2018-03-15 – 2018-03-17 (×6): 50 mg via INTRAVENOUS
  Filled 2018-03-15 (×2): qty 1
  Filled 2018-03-15: qty 2
  Filled 2018-03-15: qty 1
  Filled 2018-03-15 (×2): qty 2
  Filled 2018-03-15: qty 1
  Filled 2018-03-15: qty 2

## 2018-03-15 MED ORDER — NOREPINEPHRINE 4 MG/250ML-% IV SOLN
0.0000 ug/min | INTRAVENOUS | Status: DC
  Administered 2018-03-15: 2 ug/min via INTRAVENOUS
  Filled 2018-03-15 (×2): qty 250

## 2018-03-15 MED ORDER — HEPARIN SODIUM (PORCINE) 5000 UNIT/ML IJ SOLN
5000.0000 [IU] | Freq: Three times a day (TID) | INTRAMUSCULAR | Status: DC
  Administered 2018-03-15 – 2018-03-16 (×2): 5000 [IU] via SUBCUTANEOUS
  Filled 2018-03-15 (×2): qty 1

## 2018-03-15 MED ORDER — CHLORHEXIDINE GLUCONATE 0.12 % MT SOLN
15.0000 mL | Freq: Two times a day (BID) | OROMUCOSAL | Status: DC
  Administered 2018-03-15 – 2018-03-20 (×11): 15 mL via OROMUCOSAL
  Filled 2018-03-15 (×9): qty 15

## 2018-03-15 MED ORDER — ORAL CARE MOUTH RINSE
15.0000 mL | Freq: Two times a day (BID) | OROMUCOSAL | Status: DC
  Administered 2018-03-15 – 2018-03-19 (×10): 15 mL via OROMUCOSAL

## 2018-03-15 NOTE — Progress Notes (Signed)
Patient ID: Tracy Dickerson, female   DOB: 1941-03-03, 77 y.o.   MRN: [id number]  [facility] PROGRESS NOTE  Tracy Dickerson KZL:[id number] DOB: [birth year] DOA: 03/13/2018 PCP: Boykin Nearing, MD  HPI/Subjective: Patient feeling a little bit better.  Still having some abdominal pain.  Patient made n.p.o. after aspirating on milk.  Objective: Vitals:   03/15/18 0700 03/15/18 0800  BP: (!) 119/51 114/81  Pulse: 67 71  Resp: 17 (!) 21  Temp: 97.7 F (36.5 C) (!) 97.5 F (36.4 C)  SpO2: 99% 97%    Filed Weights   03/13/18 1423  Weight: 111.6 kg (246 lb)    ROS: Review of Systems  Constitutional: Negative for chills and fever.  Eyes: Negative for blurred vision.  Respiratory: Negative for cough and shortness of breath.   Cardiovascular: Negative for chest pain.  Gastrointestinal: Positive for abdominal pain. Negative for constipation, diarrhea, nausea and vomiting.  Genitourinary: Negative for dysuria.  Musculoskeletal: Negative for joint pain.  Neurological: Negative for dizziness and headaches.   Exam: Physical Exam  HENT:  Nose: No mucosal edema.  Mouth/Throat: No oropharyngeal exudate or posterior oropharyngeal edema.  Eyes: Pupils are equal, round, and reactive to light. Conjunctivae, EOM and lids are normal.  Neck: No JVD present. Carotid bruit is not present. No edema present. No thyroid mass and no thyromegaly present.  Cardiovascular: S1 normal and S2 normal. Exam reveals no gallop.  No murmur heard. Pulses:      Dorsalis pedis pulses are 2+ on the right side, and 2+ on the left side.  Respiratory: No respiratory distress. She has no wheezes. She has no rhonchi. She has no rales.  GI: Soft. Bowel sounds are normal. There is no tenderness.  Musculoskeletal:       Right ankle: She exhibits swelling.       Left ankle: She exhibits swelling.  Cast on left foot.  Lymphadenopathy:    She has no cervical adenopathy.  Neurological: She is alert. No cranial  nerve deficit.  Skin: Skin is warm. No rash noted. Nails show no clubbing.  Psychiatric: She has a normal mood and affect.      Data Reviewed: Basic Metabolic Panel: Recent Labs  Lab 03/13/18 1446 03/13/18 2210 03/14/18 0334 03/15/18 0539  NA 137 139 140 140  K 5.7* 4.9 4.4 4.3  CL 107 110 112* 117*  CO2 18* 19* 19* 17*  GLUCOSE 140* 152* 154* 118*  BUN 92* 91* 87* 75*  CREATININE 3.11* 2.73* 2.40* 1.74*  CALCIUM 8.0* 7.4* 7.4* 7.3*  MG  --  2.7*  --   --   PHOS  --  4.3  --  4.4   Liver Function Tests: Recent Labs  Lab 03/13/18 1446 03/13/18 2210 03/15/18 0539 03/15/18 1002  AST 70* 69*  --  41  ALT 28 28  --  17  ALKPHOS 137* 153*  --  106  BILITOT 0.8 1.0  --  0.6  PROT 7.2 7.4  --  5.7*  ALBUMIN 1.9* 2.0* 1.6* 1.2*   CBC: Recent Labs  Lab 03/13/18 1446 03/13/18 2210 03/14/18 0334 03/15/18 0539  WBC 22.6* 27.1* 22.5* 19.6*  NEUTROABS 20.7*  --   --  17.3*  HGB 8.6* 8.5* 7.5* 7.8*  HCT 25.9* 26.8* 23.7* 25.2*  MCV 90.1 82.8 82.3 82.0  PLT 434 396 331 392   Cardiac Enzymes: Recent Labs  Lab 03/13/18 2210 03/14/18 0334 03/14/18 0941  TROPONINI 0.06* 0.04* 0.03*  CBG: Recent Labs  Lab 03/13/18 2117 03/15/18 1209  GLUCAP 144* 162*    Recent Results (from the past 240 hour(s))  Blood Culture (routine x 2)     Status: Abnormal (Preliminary result)   Collection Time: 03/13/18  2:27 PM  Result Value Ref Range Status   Specimen Description   Final    BLOOD R HAND Performed at [facility], [address], [place], [place] [address]    Special Requests   Final    BOTTLES DRAWN AEROBIC AND ANAEROBIC Blood Culture adequate volume Performed at [facility], [address], [place], [place] [address]    Culture  Setup Time   Final    GRAM NEGATIVE RODS AEROBIC BOTTLE ONLY CRITICAL RESULT CALLED TO, READ BACK BY AND VERIFIED WITH: LISA KLUTTZ ON 03/14/18 AT 0850 QSD Performed at [facility], [address] [place], [place] [address]    Culture PSEUDOMONAS AERUGINOSA (A)  Final   Report Status PENDING  Incomplete  Blood Culture (routine x 2)     Status: Abnormal (Preliminary result)   Collection Time: 03/13/18  2:27 PM  Result Value Ref Range Status   Specimen Description   Final    BLOOD L HAND Performed at [facility], [address], [place], [place] [address]    Special Requests   Final    BOTTLES DRAWN AEROBIC AND ANAEROBIC Blood Culture adequate volume Performed at [facility], [address] [place], [place] [address]    Culture  Setup Time   Final    Organism ID to follow [place] TO, READ BACK BY AND VERIFIED WITH: LISA KLUTTZ ON 03/14/18 AT 0850 QSD Performed at [facility], [address] [place], [place] [address]    Culture PSEUDOMONAS AERUGINOSA (A)  Final   Report Status PENDING  Incomplete  Blood Culture ID Panel (Reflexed)     Status: Abnormal   Collection Time: 03/13/18  2:27 PM  Result Value Ref Range Status   Enterococcus species NOT DETECTED NOT DETECTED Final   Listeria monocytogenes NOT DETECTED NOT DETECTED Final   Staphylococcus species NOT DETECTED NOT DETECTED Final   Staphylococcus aureus NOT DETECTED NOT DETECTED Final   Streptococcus species NOT DETECTED NOT DETECTED Final   Streptococcus agalactiae NOT DETECTED NOT DETECTED Final   Streptococcus pneumoniae NOT DETECTED NOT DETECTED Final   Streptococcus pyogenes NOT DETECTED NOT DETECTED Final   Acinetobacter baumannii NOT DETECTED NOT DETECTED Final   Enterobacteriaceae species NOT DETECTED NOT DETECTED Final   Enterobacter cloacae complex NOT DETECTED NOT DETECTED Final   Escherichia coli NOT DETECTED NOT DETECTED Final   Klebsiella oxytoca NOT DETECTED NOT DETECTED Final   Klebsiella pneumoniae NOT DETECTED NOT DETECTED Final   Proteus species NOT DETECTED NOT DETECTED Final   Serratia marcescens NOT DETECTED  NOT DETECTED Final   Carbapenem resistance NOT DETECTED NOT DETECTED Final   Haemophilus influenzae NOT DETECTED NOT DETECTED Final   Neisseria meningitidis NOT DETECTED NOT DETECTED Final   Pseudomonas aeruginosa DETECTED (A) NOT DETECTED Final    Comment: CRITICAL RESULT CALLED TO, READ BACK BY AND VERIFIED WITH: LISA KLUTTZ ON 03/14/18 AT 0850 QSD    Candida albicans NOT DETECTED NOT DETECTED Final   Candida glabrata NOT DETECTED NOT DETECTED Final   Candida krusei NOT DETECTED NOT DETECTED Final   Candida parapsilosis NOT DETECTED NOT DETECTED Final   Candida tropicalis NOT DETECTED NOT DETECTED  Final    Comment: Performed at [facility], [address] [place], [place] [address]  Urine culture     Status: Abnormal (Preliminary result)   Collection Time: 03/13/18  2:40 PM  Result Value Ref Range Status   Specimen Description   Final    URINE, RANDOM Performed at [facility], [address], [place], [place] [address]    Special Requests   Final    NONE Performed at [facility], [address], [place], [place] [address]    Culture (A)  Final    >=100,000 COLONIES/mL GRAM NEGATIVE RODS CULTURE REINCUBATED FOR BETTER GROWTH Performed at [facility], [place] [address] [place], [place] [address]    Report Status PENDING  Incomplete  MRSA PCR Screening     Status: None   Collection Time: 03/13/18  9:27 PM  Result Value Ref Range Status   MRSA by PCR NEGATIVE NEGATIVE Final    Comment:        The GeneXpert MRSA Assay (FDA approved for NASAL specimens only), is one component of a comprehensive MRSA colonization surveillance program. It is not intended to diagnose MRSA infection nor to guide or monitor treatment for MRSA infections. Performed at [facility], [address] [place], [place] [address]      Studies: Dg Abd 1 View  Result Date: 03/15/2018 CLINICAL DATA:  Abdominal pain EXAM: ABDOMEN - 1 VIEW COMPARISON:   03/13/2018 FINDINGS: Scattered gas-filled nondilated small and large bowel. No evidence of obstruction. No radiopaque stones. Degenerative changes in the spine. Suggestion of infiltration or atelectasis in the right lung base. IMPRESSION: 1. Nonobstructive bowel gas pattern. 2. Probable infiltration or atelectasis in the right lung base. Electronically Signed   By: Lucienne Capers M.D.   On: 03/15/2018 03:08   Dg Abd 1 View  Result Date: 03/13/2018 CLINICAL DATA:  Abdominal pain. EXAM: ABDOMEN - 1 VIEW COMPARISON:  Abdominal radiograph Feb 04, 2018 FINDINGS: The bowel gas pattern is normal though, paucity of small bowel gas. No radio-opaque calculi or other significant radiographic abnormality are seen. Catheter projects in the pelvis. Follicle is project in the pelvis. IMPRESSION: Nonspecific bowel gas pattern. Electronically Signed   By: Elon Alas M.D.   On: 03/13/2018 22:41   Dg Chest Port 1 View  Result Date: 03/15/2018 CLINICAL DATA:  77 year old female with history of weakness and nonproductive cough. EXAM: PORTABLE CHEST 1 VIEW COMPARISON:  Chest x-ray 03/13/2018. FINDINGS: There is a right upper extremity PICC with tip terminating in the superior aspect of the right atrium. Lung volumes are low. Elevation of the right hemidiaphragm. Diffuse interstitial prominence and peribronchial cuffing, increased slightly compared to the prior examination. Patchy ill-defined opacities in the right lung base, concerning for airspace consolidation. Small right pleural effusion. No evidence of pulmonary edema. Heart size is normal. The patient is rotated to the right on today's exam, resulting in distortion of the mediastinal contours and reduced diagnostic sensitivity and specificity for mediastinal pathology. Surgical clips throughout the left axillary region, suggesting prior lymph node dissection. IMPRESSION: 1. Worsening diffuse peribronchial cuffing and interstitial prominence, concerning for an acute  bronchitis. This is superimposed upon a background of what appears to be chronic interstitial lung disease. 2. Small right pleural effusion. Electronically Signed   By: Vinnie Langton M.D.   On: 03/15/2018 10:48   Dg Chest Port 1 View  Result Date: 03/13/2018 CLINICAL DATA:  Weakness.  History of breast and lung cancer. EXAM:  PORTABLE CHEST 1 VIEW COMPARISON:  02/04/2018.  01/21/2018.  01/26/2017.  06/06/2016. FINDINGS: Stable cardiomegaly. Chronic interstitial changes are again noted. Low lung volumes. Mild bilateral pleural thickening again noted consistent scarring. Surgical clips left axilla. IMPRESSION: 1.  Stable cardiomegaly.  No pulmonary venous congestion. 2. Chronic interstitial lung disease. Low lung volumes with mild bibasilar atelectasis. Bilateral pleural thickening again noted consistent scarring similar findings noted on prior studies. Electronically Signed   By: Marcello Moores  Register   On: 03/13/2018 15:02   [place] Ekg Site Rite  Result Date: 03/14/2018 If Site Rite image not attached, placement could not be confirmed due to current cardiac rhythm.   Scheduled Meds: . atorvastatin  80 mg Oral Daily  . chlorhexidine  15 mL Mouth Rinse BID  . ferrous sulfate  325 mg Oral Daily  . gabapentin  300 mg Oral QHS  . hydrocortisone sod succinate (SOLU-CORTEF) inj  50 mg Intravenous Q8H  . insulin aspart  0-9 Units Subcutaneous Q4H  . ipratropium-albuterol  3 mL Nebulization Q6H  . mouth rinse  15 mL Mouth Rinse q12n4p  . pantoprazole  40 mg Oral Daily  . pramipexole  0.25 mg Oral QHS   Continuous Infusions: . sodium chloride 125 mL/hr at 03/13/18 2230  . ceFEPime (MAXIPIME) IV Stopped (03/15/18 1052)  . ciprofloxacin Stopped (03/15/18 0045)  . norepinephrine (LEVOPHED) Adult infusion 15 mcg/min (03/15/18 1234)    Assessment/Plan:  1. Septic shock with Pseudomonas.  Sources acute cystitis with hematuria.  Patient was sent home last time with Foley catheter   for urinary retention.   Patient on levophed to maintain blood pressure.  Cefepime and Cipro as per critical care specialist..  Hold antihypertensive medications.  Patient is critically ill. 2. Acute kidney injury secondary to septic shock.  IV fluid hydration.  Creatinine has improved to 1.74. 3. Hyperlipidemia unspecified on atorvastatin 4. diabetic neuropathy on gabapentin 5. GERD on Protonix 6. Restless leg syndrome on Mirapex 7. Obesity.  Weight loss needed. 8. Atrial fibrillation.  Holding Coumadin with her anemia. 9. Anemia unspecified.  Consider sending off a ferritin.  Code Status:     Code Status Orders  (From admission, onward)        Start     Ordered   03/13/18 2021  Full code  Continuous     03/13/18 2020    Code Status History    Date Active Date Inactive Code Status Order ID Comments User Context   02/13/2018 2026 02/16/2018 1637 Full Code [id number]  Gorden Harms, MD Inpatient   02/02/2018 2159 02/03/2018 2049 Full Code [id number]  Corky Mull, MD Inpatient   10/29/2017 1816 10/31/2017 1816 DNR [id number]  Hillary Bow, MD ED   01/26/2017 2250 01/27/2017 1427 Full Code [id number]  Henreitta Leber, MD ED     Family Communication: As per critical care specialist Disposition Plan: To be determined  Consultants:  Critical care specialist  Antibiotics:  Cefepime  Cipro  Time spent: 27 minutes  [place]

## 2018-03-15 NOTE — Consult Note (Signed)
Urology Consult  Referring physician: Dr. Leslye Peer Reason for referral: urinary retention, recurrent UTIs  Chief Complaint: dysuria  History of Present Illness: Ms Frysinger is a 77yo with a hx of DMII, HTN, and luing cancer who was admitted with sepsis from a urinary source. In the ER she was noted to have 850cc on bladder scan and a foley catheter was placed. The patient notes she has urinary frequency every 49mnutes, urgency, dysuria, and nocturia 7-8x for the past several months. She has had multiple admission for UTIs in the past 2 years. She was seen by Dr. SBernardo Heateron 02/26/2018 for persistent urinary retention and passed her voiding trial on that visit. She was scheduled for followup this week. Creatinine 1.74. WBC count 19.6  Past Medical History:  Diagnosis Date  . Cancer ([place]    Breast and lung  . Diabetes mellitus without complication ([place]   . GERD (gastroesophageal reflux disease)   . Heart disease   . Hypertension    Past Surgical History:  Procedure Laterality Date  . ABDOMINAL HYSTERECTOMY    . APPENDECTOMY    . BREAST SURGERY     breast cancer LEFT  . OPEN REDUCTION INTERNAL FIXATION (ORIF) TIBIA/FIBULA FRACTURE Left 02/03/2018   Procedure: OPEN REDUCTION INTERNAL FIXATION (ORIF) DISTAL TIBIA FRACTURE;  Surgeon: PCorky Mull MD;  Location: [facility] ORS;  Service: Orthopedics;  Laterality: Left;  ankle/lower leg  . TONSILLECTOMY      Medications: I have reviewed the patient's current medications. Allergies:  Allergies  Allergen Reactions  . Oxycodone   . Percocet [Oxycodone-Acetaminophen] Itching  . Penicillins Rash    Has patient had a PCN reaction causing immediate rash, facial/tongue/throat swelling, SOB or lightheadedness with hypotension: Yes Has patient had a PCN reaction causing severe rash involving mucus membranes or skin necrosis: Unknown Has patient had a PCN reaction that required hospitalization: Unknown Has patient had a PCN reaction occurring within the  last 10 years: No If all of the above answers are "NO", then may proceed with Cephalosporin use.    Family History  Problem Relation Age of Onset  . Cancer Mother    Social History:  reports that she has quit smoking. She has never used smokeless tobacco. She reports that she drinks alcohol. She reports that she does not use drugs.  Review of Systems  Constitutional: Positive for chills and malaise/fatigue.  Gastrointestinal: Positive for abdominal pain.  Genitourinary: Positive for dysuria, frequency and urgency.  All other systems reviewed and are negative.   Physical Exam:  Vital signs in last 24 hours: Temp:  [97 F (36.1 C)-97.7 F (36.5 C)] 97.5 F (36.4 C) (06/23 0800) Pulse Rate:  [66-76] 71 (06/23 0800) Resp:  [13-26] 21 (06/23 0800) BP: (86-125)/(39-81) 114/81 (06/23 0800) SpO2:  [97 %-100 %] 97 % (06/23 0800) FiO2 (%):  [28 %] 28 % (06/22 1947) Physical Exam  Constitutional: She is oriented to person, place, and time. She appears well-developed and well-nourished.  HENT:  Head: Normocephalic and atraumatic.  Eyes: Pupils are equal, round, and reactive to light. EOM are normal.  Neck: Normal range of motion. No thyromegaly present.  Cardiovascular: Normal rate and regular rhythm.  Respiratory: Effort normal. No respiratory distress.  GI: Soft. She exhibits no distension.  Musculoskeletal: Normal range of motion. She exhibits no edema.  Neurological: She is alert and oriented to person, place, and time.  Skin: Skin is warm and dry.  Psychiatric: She has a normal mood and affect. Her behavior is normal.  Judgment and thought content normal.    Laboratory Data:  Results for orders placed or performed during the hospital encounter of 03/13/18 (from the past 72 hour(s))  Blood Culture (routine x 2)     Status: Abnormal (Preliminary result)   Collection Time: 03/13/18  2:27 PM  Result Value Ref Range   Specimen Description      BLOOD R HAND Performed at [facility], [address], [place], [place] [address]    Special Requests      BOTTLES DRAWN AEROBIC AND ANAEROBIC Blood Culture adequate volume Performed at [facility], [address] [place], [place] [address]    Culture  Setup Time      GRAM NEGATIVE RODS AEROBIC BOTTLE ONLY CRITICAL RESULT CALLED TO, READ BACK BY AND VERIFIED WITH: [place] ON 03/14/18 AT 0850 QSD Performed at [facility], [address] [place], [place] [address]    Culture PSEUDOMONAS AERUGINOSA (A)    Report Status PENDING   Blood Culture (routine x 2)     Status: Abnormal (Preliminary result)   Collection Time: 03/13/18  2:27 PM  Result Value Ref Range   Specimen Description      BLOOD L HAND Performed at [facility], [address], [place], [place] [address]    Special Requests      BOTTLES DRAWN AEROBIC AND ANAEROBIC Blood Culture adequate volume Performed at [facility], [address] [place], [place] [address]    Culture  Setup Time      Organism ID to follow [place] TO, READ BACK BY AND VERIFIED WITH: [place] ON 03/14/18 AT 0850 QSD Performed at [facility], [address] [place], [place] [address]    Culture PSEUDOMONAS AERUGINOSA (A)    Report Status PENDING   Lactic acid, plasma     Status: None   Collection Time: 03/13/18  2:27 PM  Result Value Ref Range   Lactic Acid, Venous 1.9 0.5 - 1.9 mmol/L    Comment: Performed at [facility], [address] [place], [place] [address]  Blood Culture ID Panel (Reflexed)     Status: Abnormal   Collection Time: 03/13/18  2:27 PM  Result Value Ref Range   Enterococcus species NOT DETECTED NOT DETECTED   Listeria monocytogenes NOT DETECTED NOT DETECTED   Staphylococcus species NOT DETECTED NOT DETECTED   Staphylococcus aureus NOT DETECTED NOT DETECTED   Streptococcus species NOT DETECTED NOT DETECTED   Streptococcus  agalactiae NOT DETECTED NOT DETECTED   Streptococcus pneumoniae NOT DETECTED NOT DETECTED   Streptococcus pyogenes NOT DETECTED NOT DETECTED   Acinetobacter baumannii NOT DETECTED NOT DETECTED   Enterobacteriaceae species NOT DETECTED NOT DETECTED   Enterobacter cloacae complex NOT DETECTED NOT DETECTED   Escherichia coli NOT DETECTED NOT DETECTED   Klebsiella oxytoca NOT DETECTED NOT DETECTED   Klebsiella pneumoniae NOT DETECTED NOT DETECTED   Proteus species NOT DETECTED NOT DETECTED   Serratia marcescens NOT DETECTED NOT DETECTED   Carbapenem resistance NOT DETECTED NOT DETECTED   Haemophilus influenzae NOT DETECTED NOT DETECTED   Neisseria meningitidis NOT DETECTED NOT DETECTED   Pseudomonas aeruginosa DETECTED (A) NOT DETECTED    Comment: CRITICAL RESULT CALLED TO, READ BACK BY AND VERIFIED WITH: LISA KLUTTZ ON 03/14/18 AT 0850 QSD    Candida albicans NOT DETECTED NOT DETECTED   Candida glabrata NOT DETECTED NOT DETECTED   Candida krusei NOT DETECTED NOT  DETECTED   Candida parapsilosis NOT DETECTED NOT DETECTED   Candida tropicalis NOT DETECTED NOT DETECTED    Comment: Performed at [facility], [address] [place], [place] [address]  Urinalysis, Routine w reflex microscopic     Status: Abnormal   Collection Time: 03/13/18  2:40 PM  Result Value Ref Range   Color, Urine AMBER (A) YELLOW    Comment: BIOCHEMICALS MAY BE AFFECTED BY COLOR   APPearance TURBID (A) CLEAR   Specific Gravity, Urine 1.011 1.005 - 1.030   pH 7.0 5.0 - 8.0   Glucose, UA NEGATIVE NEGATIVE mg/dL   Hgb urine dipstick LARGE (A) NEGATIVE   Bilirubin Urine NEGATIVE NEGATIVE   Ketones, ur NEGATIVE NEGATIVE mg/dL   Protein, ur 100 (A) NEGATIVE mg/dL   Nitrite POSITIVE (A) NEGATIVE   Leukocytes, UA LARGE (A) NEGATIVE   RBC / HPF >50 (H) 0 - 5 RBC/hpf   WBC, UA >50 (H) 0 - 5 WBC/hpf   Bacteria, UA MANY (A) NONE SEEN   Squamous Epithelial / LPF 0-5 0 - 5   WBC Clumps PRESENT     Comment:  Performed at [facility], [address], [place], [place] [address]  Urine culture     Status: Abnormal (Preliminary result)   Collection Time: 03/13/18  2:40 PM  Result Value Ref Range   Specimen Description      URINE, RANDOM Performed at [facility], [address] [place], [place] [address]    Special Requests      NONE Performed at [facility], [address] [place], [place] [address]    Culture (A)     >=100,000 COLONIES/mL GRAM NEGATIVE RODS CULTURE REINCUBATED FOR BETTER GROWTH Performed at [facility], [address] [place], [place] [address]    Report Status PENDING   Comprehensive metabolic panel     Status: Abnormal   Collection Time: 03/13/18  2:46 PM  Result Value Ref Range   Sodium 137 135 - 145 mmol/L   Potassium 5.7 (H) 3.5 - 5.1 mmol/L   Chloride 107 101 - 111 mmol/L   CO2 18 (L) 22 - 32 mmol/L   Glucose, Bld 140 (H) 65 - 99 mg/dL   BUN 92 (H) 6 - 20 mg/dL   Creatinine, Ser 3.11 (H) 0.44 - 1.00 mg/dL   Calcium 8.0 (L) 8.9 - 10.3 mg/dL   Total Protein 7.2 6.5 - 8.1 g/dL   Albumin 1.9 (L) 3.5 - 5.0 g/dL   AST 70 (H) 15 - 41 U/L   ALT 28 14 - 54 U/L   Alkaline Phosphatase 137 (H) 38 - 126 U/L   Total Bilirubin 0.8 0.3 - 1.2 mg/dL   GFR calc non Af Amer 14 (L) >60 mL/min   GFR calc Af Amer 16 (L) >60 mL/min    Comment: (NOTE) The eGFR has been calculated using the CKD EPI equation. This calculation has not been validated in all clinical situations. eGFR's persistently <60 mL/min signify possible Chronic Kidney Disease.    Anion gap 12 5 - 15    Comment: Performed at [facility], [address] [place], [place] [address]  CBC WITH DIFFERENTIAL     Status: Abnormal   Collection Time: 03/13/18  2:46 PM  Result Value Ref Range   WBC 22.6 (H) 3.6 - 11.0 K/uL   RBC 2.87 (L) 3.80 - 5.20 MIL/uL   Hemoglobin 8.6 (L) 12.0 - 16.0 g/dL   HCT 25.9 (  L) 35.0 - 47.0 %   MCV 90.1 80.0 - 100.0 fL   MCH 29.9  26.0 - 34.0 pg   MCHC 33.2 32.0 - 36.0 g/dL   RDW 21.9 (H) 11.5 - 14.5 %   Platelets 434 150 - 440 K/uL   Neutrophils Relative % 92 %   Neutro Abs 20.7 (H) 1.4 - 6.5 K/uL   Lymphocytes Relative 4 %   Lymphs Abs 1.0 1.0 - 3.6 K/uL   Monocytes Relative 4 %   Monocytes Absolute 0.8 0.2 - 0.9 K/uL   Eosinophils Relative 0 %   Eosinophils Absolute 0.0 0 - 0.7 K/uL   Basophils Relative 0 %   Basophils Absolute 0.1 0 - 0.1 K/uL    Comment: Performed at [facility], [address] [place], [place] [address]  Protime-INR     Status: Abnormal   Collection Time: 03/13/18  2:46 PM  Result Value Ref Range   Prothrombin Time >90.0 (H) 11.4 - 15.2 seconds   INR >10.00 (HH)     Comment: CRITICAL RESULT CALLED TO, READ BACK BY AND VERIFIED WITH: DEVETTA MCCLAIN _0  03/13/18 AKT Performed at [facility], [address] [place], [place] [address]   Glucose, capillary     Status: Abnormal   Collection Time: 03/13/18  9:17 PM  Result Value Ref Range   Glucose-Capillary 144 (H) 65 - 99 mg/dL  MRSA PCR Screening     Status: None   Collection Time: 03/13/18  9:27 PM  Result Value Ref Range   MRSA by PCR NEGATIVE NEGATIVE    Comment:        The GeneXpert MRSA Assay (FDA approved for NASAL specimens only), is one component of a comprehensive MRSA colonization surveillance program. It is not intended to diagnose MRSA infection nor to guide or monitor treatment for MRSA infections. Performed at [facility], [address] [place], [place] [address]   Lactic acid, plasma     Status: None   Collection Time: 03/13/18 10:09 PM  Result Value Ref Range   Lactic Acid, Venous 1.7 0.5 - 1.9 mmol/L    Comment: Performed at [facility], [address] [place], [place] [address]  Protime-INR     Status: Abnormal   Collection Time: 03/13/18 10:10 PM  Result Value Ref Range   Prothrombin Time 27.7 (H) 11.4 - 15.2 seconds   INR 2.61     Comment: Performed  at [facility], [address] [place], [place] [address]  Troponin I     Status: Abnormal   Collection Time: 03/13/18 10:10 PM  Result Value Ref Range   Troponin I 0.06 (HH) <0.03 ng/mL    Comment: CRITICAL RESULT CALLED TO, READ BACK BY AND VERIFIED WITH BETH BUONO AT 2252 ON 03/13/18 RWW Performed at [facility], [address] [place], [place] [address]   Magnesium     Status: Abnormal   Collection Time: 03/13/18 10:10 PM  Result Value Ref Range   Magnesium 2.7 (H) 1.7 - 2.4 mg/dL    Comment: Performed at [facility], [address], [place], [place] [address]  Phosphorus     Status: None   Collection Time: 03/13/18 10:10 PM  Result Value Ref Range   Phosphorus 4.3 2.5 - 4.6 mg/dL    Comment: Performed at [facility], [address] [place], [place] [address]  CBC     Status: Abnormal   Collection Time: 03/13/18 10:10 PM  Result  Value Ref Range   WBC 27.1 (H) 3.6 - 11.0 K/uL   RBC 3.24 (L) 3.80 - 5.20 MIL/uL   Hemoglobin 8.5 (L) 12.0 - 16.0 g/dL   HCT 26.8 (L) 35.0 - 47.0 %   MCV 82.8 80.0 - 100.0 fL   MCH 26.3 26.0 - 34.0 pg   MCHC 31.8 (L) 32.0 - 36.0 g/dL   RDW 22.7 (H) 11.5 - 14.5 %   Platelets 396 150 - 440 K/uL    Comment: Performed at [facility], [address] [place], [place] [address]  Comprehensive metabolic panel     Status: Abnormal   Collection Time: 03/13/18 10:10 PM  Result Value Ref Range   Sodium 139 135 - 145 mmol/L   Potassium 4.9 3.5 - 5.1 mmol/L   Chloride 110 101 - 111 mmol/L   CO2 19 (L) 22 - 32 mmol/L   Glucose, Bld 152 (H) 65 - 99 mg/dL   BUN 91 (H) 6 - 20 mg/dL   Creatinine, Ser 2.73 (H) 0.44 - 1.00 mg/dL   Calcium 7.4 (L) 8.9 - 10.3 mg/dL   Total Protein 7.4 6.5 - 8.1 g/dL   Albumin 2.0 (L) 3.5 - 5.0 g/dL   AST 69 (H) 15 - 41 U/L   ALT 28 14 - 54 U/L   Alkaline Phosphatase 153 (H) 38 - 126 U/L   Total Bilirubin 1.0 0.3 - 1.2 mg/dL   GFR calc non Af Amer 16 (L) >60 mL/min   GFR  calc Af Amer 19 (L) >60 mL/min    Comment: (NOTE) The eGFR has been calculated using the CKD EPI equation. This calculation has not been validated in all clinical situations. eGFR's persistently <60 mL/min signify possible Chronic Kidney Disease.    Anion gap 10 5 - 15    Comment: Performed at [facility], [address] [place], [place] [address]  Type and screen [place]     Status: None   Collection Time: 03/13/18 10:10 PM  Result Value Ref Range   ABO/RH(D) O POS    Antibody Screen NEG    Sample Expiration      03/16/2018 Performed at [facility], [address] [place], [place] [address]   Protime-INR     Status: Abnormal   Collection Time: 03/14/18  3:34 AM  Result Value Ref Range   Prothrombin Time 21.9 (H) 11.4 - 15.2 seconds   INR 1.93     Comment: Performed at [facility], [address] [place], [place] [address]  Basic metabolic panel     Status: Abnormal   Collection Time: 03/14/18  3:34 AM  Result Value Ref Range   Sodium 140 135 - 145 mmol/L   Potassium 4.4 3.5 - 5.1 mmol/L   Chloride 112 (H) 101 - 111 mmol/L   CO2 19 (L) 22 - 32 mmol/L   Glucose, Bld 154 (H) 65 - 99 mg/dL   BUN 87 (H) 6 - 20 mg/dL   Creatinine, Ser 2.40 (H) 0.44 - 1.00 mg/dL   Calcium 7.4 (L) 8.9 - 10.3 mg/dL   GFR calc non Af Amer 19 (L) >60 mL/min   GFR calc Af Amer 22 (L) >60 mL/min    Comment: (NOTE) The eGFR has been calculated using the CKD EPI equation. This calculation has not been validated in all clinical situations. eGFR's persistently <60 mL/min signify possible Chronic Kidney Disease.    Anion gap 9 5 - 15  Comment: Performed at [facility], [address] [place], [place] [address]  CBC     Status: Abnormal   Collection Time: 03/14/18  3:34 AM  Result Value Ref Range   WBC 22.5 (H) 3.6 - 11.0 K/uL   RBC 2.88 (L) 3.80 - 5.20 MIL/uL   Hemoglobin 7.5 (L) 12.0 - 16.0 g/dL   HCT 23.7 (L) 35.0 - 47.0 %    MCV 82.3 80.0 - 100.0 fL   MCH 26.2 26.0 - 34.0 pg   MCHC 31.8 (L) 32.0 - 36.0 g/dL   RDW 22.1 (H) 11.5 - 14.5 %   Platelets 331 150 - 440 K/uL    Comment: Performed at [facility], [address] [place], [place] [address]  Lactic acid, plasma     Status: None   Collection Time: 03/14/18  3:34 AM  Result Value Ref Range   Lactic Acid, Venous 1.0 0.5 - 1.9 mmol/L    Comment: Performed at [facility], [address] [place], [place] [address]  Troponin I     Status: Abnormal   Collection Time: 03/14/18  3:34 AM  Result Value Ref Range   Troponin I 0.04 (HH) <0.03 ng/mL    Comment: CRITICAL VALUE NOTED. VALUE IS CONSISTENT WITH PREVIOUSLY REPORTED/CALLED VALUE RWW Performed at [facility], [address] [place], [place] [address]   Troponin I     Status: Abnormal   Collection Time: 03/14/18  9:41 AM  Result Value Ref Range   Troponin I 0.03 (HH) <0.03 ng/mL    Comment: CRITICAL VALUE NOTED. VALUE IS CONSISTENT WITH PREVIOUSLY REPORTED/CALLED VALUE. QSD Performed at [facility], [address] [place], [place] [address]   Procalcitonin - Baseline     Status: None   Collection Time: 03/14/18  9:41 AM  Result Value Ref Range   Procalcitonin 8.22 ng/mL    Comment:        Interpretation: PCT > 2 ng/mL: Systemic infection (sepsis) is likely, unless other causes are known. (NOTE)       Sepsis PCT Algorithm           Lower Respiratory Tract                                      Infection PCT Algorithm    ----------------------------     ----------------------------         PCT < 0.25 ng/mL                PCT < 0.10 ng/mL         Strongly encourage             Strongly discourage   discontinuation of antibiotics    initiation of antibiotics    ----------------------------     -----------------------------       PCT 0.25 - 0.50 ng/mL            PCT 0.10 - 0.25 ng/mL               OR       >80% decrease in PCT            Discourage  initiation of  antibiotics      Encourage discontinuation           of antibiotics    ----------------------------     -----------------------------         PCT >= 0.50 ng/mL              PCT 0.26 - 0.50 ng/mL               AND       <80% decrease in PCT              Encourage initiation of                                             antibiotics       Encourage continuation           of antibiotics    ----------------------------     -----------------------------        PCT >= 0.50 ng/mL                  PCT > 0.50 ng/mL               AND         increase in PCT                  Strongly encourage                                      initiation of antibiotics    Strongly encourage escalation           of antibiotics                                     -----------------------------                                           PCT <= 0.25 ng/mL                                                 OR                                        > 80% decrease in PCT                                     Discontinue / Do not initiate                                             antibiotics Performed at [facility], [address] [place], [place] [address]   Procalcitonin     Status: None   Collection Time: 03/15/18  5:39 AM  Result Value Ref Range   Procalcitonin 5.46 ng/mL    Comment:        Interpretation: PCT > 2 ng/mL: Systemic infection (sepsis) is likely, unless other causes are known. (NOTE)       Sepsis PCT Algorithm           Lower Respiratory Tract                                      Infection PCT Algorithm    ----------------------------     ----------------------------         PCT < 0.25 ng/mL                PCT < 0.10 ng/mL         Strongly encourage             Strongly discourage   discontinuation of antibiotics    initiation of antibiotics    ----------------------------     -----------------------------       PCT 0.25 -  0.50 ng/mL            PCT 0.10 - 0.25 ng/mL               OR       >80% decrease in PCT            Discourage initiation of                                            antibiotics      Encourage discontinuation           of antibiotics    ----------------------------     -----------------------------         PCT >= 0.50 ng/mL              PCT 0.26 - 0.50 ng/mL               AND       <80% decrease in PCT              Encourage initiation of                                             antibiotics       Encourage continuation           of antibiotics    ----------------------------     -----------------------------        PCT >= 0.50 ng/mL                  PCT > 0.50 ng/mL               AND         increase in PCT                  Strongly encourage                                      initiation of antibiotics    Strongly encourage escalation           of  antibiotics                                     -----------------------------                                           PCT <= 0.25 ng/mL                                                 OR                                        > 80% decrease in PCT                                     Discontinue / Do not initiate                                             antibiotics Performed at [facility], [address] [place], [place] [address]   Renal function panel     Status: Abnormal   Collection Time: 03/15/18  5:39 AM  Result Value Ref Range   Sodium 140 135 - 145 mmol/L   Potassium 4.3 3.5 - 5.1 mmol/L   Chloride 117 (H) 101 - 111 mmol/L   CO2 17 (L) 22 - 32 mmol/L   Glucose, Bld 118 (H) 65 - 99 mg/dL   BUN 75 (H) 6 - 20 mg/dL   Creatinine, Ser 1.74 (H) 0.44 - 1.00 mg/dL   Calcium 7.3 (L) 8.9 - 10.3 mg/dL   Phosphorus 4.4 2.5 - 4.6 mg/dL   Albumin 1.6 (L) 3.5 - 5.0 g/dL   GFR calc non Af Amer 28 (L) >60 mL/min   GFR calc Af Amer 32 (L) >60 mL/min    Comment: (NOTE) The eGFR has been calculated using the CKD EPI  equation. This calculation has not been validated in all clinical situations. eGFR's persistently <60 mL/min signify possible Chronic Kidney Disease.    Anion gap 6 5 - 15    Comment: Performed at [facility], [place], [place] [address]  CBC with Differential/Platelet     Status: Abnormal   Collection Time: 03/15/18  5:39 AM  Result Value Ref Range   WBC 19.6 (H) 3.6 - 11.0 K/uL   RBC 3.07 (L) 3.80 - 5.20 MIL/uL   Hemoglobin 7.8 (L) 12.0 - 16.0 g/dL   HCT 25.2 (L) 35.0 - 47.0 %   MCV 82.0 80.0 - 100.0 fL   MCH 25.5 (L) 26.0 - 34.0 pg   MCHC 31.1 (L) 32.0 - 36.0 g/dL   RDW 22.9 (H) 11.5 - 14.5 %   Platelets 392 150 - 440 K/uL   Neutrophils Relative % 88 %   Neutro Abs 17.3 (H) 1.4 - 6.5 K/uL   Lymphocytes Relative 7 %   Lymphs Abs 1.3 1.0 -  3.6 K/uL   Monocytes Relative 4 %   Monocytes Absolute 0.7 0.2 - 0.9 K/uL   Eosinophils Relative 1 %   Eosinophils Absolute 0.3 0 - 0.7 K/uL   Basophils Relative 0 %   Basophils Absolute 0.0 0 - 0.1 K/uL    Comment: Performed at [facility], [address] [place], [place] [address]  Hepatic function panel     Status: Abnormal   Collection Time: 03/15/18 10:02 AM  Result Value Ref Range   Total Protein 5.7 (L) 6.5 - 8.1 g/dL   Albumin 1.2 (L) 3.5 - 5.0 g/dL   AST 41 15 - 41 U/L   ALT 17 14 - 54 U/L   Alkaline Phosphatase 106 38 - 126 U/L   Total Bilirubin 0.6 0.3 - 1.2 mg/dL   Bilirubin, Direct 0.2 0.1 - 0.5 mg/dL   Indirect Bilirubin 0.4 0.3 - 0.9 mg/dL    Comment: Performed at [facility], [address] [place], [place] [address]  Glucose, capillary     Status: Abnormal   Collection Time: 03/15/18 12:09 PM  Result Value Ref Range   Glucose-Capillary 162 (H) 65 - 99 mg/dL  Glucose, capillary     Status: Abnormal   Collection Time: 03/15/18  4:06 PM  Result Value Ref Range   Glucose-Capillary 161 (H) 65 - 99 mg/dL   Recent Results (from the past 240 hour(s))  Blood Culture  (routine x 2)     Status: Abnormal (Preliminary result)   Collection Time: 03/13/18  2:27 PM  Result Value Ref Range Status   Specimen Description   Final    BLOOD R HAND Performed at [facility], [address] [place], [place] [address]    Special Requests   Final    BOTTLES DRAWN AEROBIC AND ANAEROBIC Blood Culture adequate volume Performed at [facility], [address], [place], [place] [address]    Culture  Setup Time   Final    GRAM NEGATIVE RODS AEROBIC BOTTLE ONLY CRITICAL RESULT CALLED TO, READ BACK BY AND VERIFIED WITH: [place] ON 03/14/18 AT 0850 QSD Performed at [facility], [address], [place], [place] [address]    Culture PSEUDOMONAS AERUGINOSA (A)  Final   Report Status PENDING  Incomplete  Blood Culture (routine x 2)     Status: Abnormal (Preliminary result)   Collection Time: 03/13/18  2:27 PM  Result Value Ref Range Status   Specimen Description   Final    BLOOD L HAND Performed at [facility], [address], [place], [place] [address]    Special Requests   Final    BOTTLES DRAWN AEROBIC AND ANAEROBIC Blood Culture adequate volume Performed at [facility], [address] [place], [place] [address]    Culture  Setup Time   Final    Organism ID to follow [place] TO, READ BACK BY AND VERIFIED WITH: [place] ON 03/14/18 AT 0850 QSD Performed at [facility], [address] [place], [place] [address]    Culture PSEUDOMONAS AERUGINOSA (A)  Final   Report Status PENDING  Incomplete  Blood Culture ID Panel (Reflexed)     Status: Abnormal   Collection Time: 03/13/18  2:27 PM  Result Value Ref Range Status   Enterococcus species NOT DETECTED NOT DETECTED Final   Listeria monocytogenes NOT DETECTED NOT DETECTED Final   Staphylococcus species NOT DETECTED NOT DETECTED Final  Staphylococcus aureus NOT DETECTED NOT DETECTED Final    Streptococcus species NOT DETECTED NOT DETECTED Final   Streptococcus agalactiae NOT DETECTED NOT DETECTED Final   Streptococcus pneumoniae NOT DETECTED NOT DETECTED Final   Streptococcus pyogenes NOT DETECTED NOT DETECTED Final   Acinetobacter baumannii NOT DETECTED NOT DETECTED Final   Enterobacteriaceae species NOT DETECTED NOT DETECTED Final   Enterobacter cloacae complex NOT DETECTED NOT DETECTED Final   Escherichia coli NOT DETECTED NOT DETECTED Final   Klebsiella oxytoca NOT DETECTED NOT DETECTED Final   Klebsiella pneumoniae NOT DETECTED NOT DETECTED Final   Proteus species NOT DETECTED NOT DETECTED Final   Serratia marcescens NOT DETECTED NOT DETECTED Final   Carbapenem resistance NOT DETECTED NOT DETECTED Final   Haemophilus influenzae NOT DETECTED NOT DETECTED Final   Neisseria meningitidis NOT DETECTED NOT DETECTED Final   Pseudomonas aeruginosa DETECTED (A) NOT DETECTED Final    Comment: CRITICAL RESULT CALLED TO, READ BACK BY AND VERIFIED WITH: LISA KLUTTZ ON 03/14/18 AT 0850 QSD    Candida albicans NOT DETECTED NOT DETECTED Final   Candida glabrata NOT DETECTED NOT DETECTED Final   Candida krusei NOT DETECTED NOT DETECTED Final   Candida parapsilosis NOT DETECTED NOT DETECTED Final   Candida tropicalis NOT DETECTED NOT DETECTED Final    Comment: Performed at [facility], [address], [place], [place] [address]  Urine culture     Status: Abnormal (Preliminary result)   Collection Time: 03/13/18  2:40 PM  Result Value Ref Range Status   Specimen Description   Final    URINE, RANDOM Performed at [facility], [address], [place], [place] [address]    Special Requests   Final    NONE Performed at [facility], [address] [place], [place] [address]    Culture (A)  Final    >=100,000 COLONIES/mL GRAM NEGATIVE RODS CULTURE REINCUBATED FOR BETTER GROWTH Performed at [facility], [place] [address], [place], [place] [address]     Report Status PENDING  Incomplete  MRSA PCR Screening     Status: None   Collection Time: 03/13/18  9:27 PM  Result Value Ref Range Status   MRSA by PCR NEGATIVE NEGATIVE Final    Comment:        The GeneXpert MRSA Assay (FDA approved for NASAL specimens only), is one component of a comprehensive MRSA colonization surveillance program. It is not intended to diagnose MRSA infection nor to guide or monitor treatment for MRSA infections. Performed at [facility], [address] [place], [place] [address]    Creatinine: Recent Labs    03/13/18 1446 03/13/18 2210 03/14/18 0334 03/15/18 0539  CREATININE 3.11* 2.73* 2.40* 1.74*   Baseline Creatinine: 1  Impression/Assessment:  76yo with recurrent UTIs and urinary retention  Plan:  1. Recurrent UTIs: I discussed the various causes of recurrent UTIs with the patient and the likelihood her UTIs are related to incomplete bladder emptying.. For her current UTI please continue broad spectrum antibiotics pending her urine culture 2. Urinary retention: Please continue indwelling foley. I discussed the workup of urinary retention with the patient and the various causes of urinary retention. The patient will need Urodynamics as an outpatient to delineate the cause of her urinary retention The foley catheter should remain in place until her next appointment with [facility]  Nicolette Bang 03/15/2018, 5:11 PM

## 2018-03-15 NOTE — Progress Notes (Signed)
PULMONARY / CRITICAL CARE MEDICINE   Name: Tracy Dickerson MRN: [id number] DOB: 06-24-1941    ADMISSION DATE:  03/13/2018   CONSULTATION DATE:  03/13/2018  REFERRING MD: Dr. Estanislado Pandy  Reason: Septic shock  HISTORY OF PRESENT ILLNESS:   This is a 77 year old SNF patient who presented to the ED from [facility] with complaints of weakness, fever and tachycardia.  Weakness have progressively gotten worse over the course of a week and today patient spiked a fever of 102 F hence EMS was called.  Upon EMS arrival, patient was on oxygen with a stable SPO2 but complained of shortness of breath and diffuse abdominal pain and weakness.  At the ED, she was brought in for sepsis.  She had WBC was 22 . 6K, hemoglobin of 8.6 INR greater than 10, creatinine of 3.1, BUN of 92 and a urinalysis that is positive for nitrites, leukocyte esterase bacteria and WBCs.  She has been given vitamin K, IV fluids and started on pressors.  She is being admitted to the ICU for management of urosepsis and septic shock. Prior to ED presentation, patient was seen 2 weeks ago by urology for urinary retention.  At the ED her bladder scan revealed more than 849 mL sensitive Foley catheter was placed and 850 cc of purulent urine was drained.   REVIEW OF SYSTEMS:   Constitutional: apyrexial HENT: Negative for congestion and rhinorrhea.  Eyes: Negative for redness and visual disturbance.  Respiratory: no cough Cardiovascular: Negative for chest pain and palpitations.  Gastrointestinal: Episode of emesis, epigastric discomfort  Genitourinary: negative Endocrine: Denies polyuria, polyphagia and heat intolerance Musculoskeletal: Negative for myalgias and arthralgias.  Skin: Negative for pallor and wound.  Neurological: Negative for dizziness and headaches   SUBJECTIVE:  Patient reported that she felt better but needed a laxative for a BM VITAL SIGNS: BP 114/81 (BP Location: Left Arm)   Pulse 71   Temp (!) 97.5 F  (36.4 C) (Bladder)   Resp (!) 21   Ht 5\' 6"  (1.676 m)   Wt 246 lb (111.6 kg)   SpO2 97%   BMI 39.71 kg/m   HEMODYNAMICS:  still on vasopressor Hypotensive on vasopressor  OXYGEN: 2 liters [place]   INTAKE / OUTPUT: I/O last 3 completed shifts: In: 7045.4 [P.O.:480; I.V.:6263.8; IV Piggyback:301.7] Out: 1375 [Urine:1375]  PHYSICAL EXAMINATION: General: comfortable in bed Neuro: Alert and oriented x3, moves all extremities, follows commands HEENT: PERRLA, no JVD, trachea midline Cardiovascular: Apical pulse tachycardic, S1-S2, no murmur regurg or gallop, +2 pulses Lungs: Bilateral breath sounds, diminished in the bases, no wheezes or rhonchi Abdomen:  normal bowel sounds, non tender Musculoskeletal: Positive range of motion in upper and lower extremities, left lower extremity cast Skin: Warm and dry  LABS:  BMET Recent Labs  Lab 03/13/18 2210 03/14/18 0334 03/15/18 0539  NA 139 140 140  K 4.9 4.4 4.3  CL 110 112* 117*  CO2 19* 19* 17*  BUN 91* 87* 75*  CREATININE 2.73* 2.40* 1.74*  GLUCOSE 152* 154* 118*    Electrolytes Recent Labs  Lab 03/13/18 2210 03/14/18 0334 03/15/18 0539  CALCIUM 7.4* 7.4* 7.3*  MG 2.7*  --   --   PHOS 4.3  --  4.4    CBC Recent Labs  Lab 03/13/18 2210 03/14/18 0334 03/15/18 0539  WBC 27.1* 22.5* 19.6*  HGB 8.5* 7.5* 7.8*  HCT 26.8* 23.7* 25.2*  PLT 396 331 392    Coag's Recent Labs  Lab 03/13/18 1446 03/13/18  2210 03/14/18 0334  INR >10.00* 2.61 1.93    Sepsis Markers Recent Labs  Lab 03/13/18 1427 03/13/18 2209 03/14/18 0334 03/14/18 0941 03/15/18 0539  LATICACIDVEN 1.9 1.7 1.0  --   --   PROCALCITON  --   --   --  8.22 5.46    ABG No results for input(s): PHART, PCO2ART, PO2ART in the last 168 hours.  Liver Enzymes Recent Labs  Lab 03/13/18 1446 03/13/18 2210 03/15/18 0539 03/15/18 1002  AST 70* 69*  --  41  ALT 28 28  --  17  ALKPHOS 137* 153*  --  106  BILITOT 0.8 1.0  --  0.6  ALBUMIN  1.9* 2.0* 1.6* 1.2*    Cardiac Enzymes Recent Labs  Lab 03/13/18 2210 03/14/18 0334 03/14/18 0941  TROPONINI 0.06* 0.04* 0.03*    Glucose Recent Labs  Lab 03/13/18 2117  GLUCAP 144*    Imaging Dg Abd 1 View  Result Date: 03/15/2018 CLINICAL DATA:  Abdominal pain EXAM: ABDOMEN - 1 VIEW COMPARISON:  03/13/2018 FINDINGS: Scattered gas-filled nondilated small and large bowel. No evidence of obstruction. No radiopaque stones. Degenerative changes in the spine. Suggestion of infiltration or atelectasis in the right lung base. IMPRESSION: 1. Nonobstructive bowel gas pattern. 2. Probable infiltration or atelectasis in the right lung base. Electronically Signed   By: Lucienne Capers M.D.   On: 03/15/2018 03:08   Dg Chest Port 1 View  Result Date: 03/15/2018 CLINICAL DATA:  77 year old female with history of weakness and nonproductive cough. EXAM: PORTABLE CHEST 1 VIEW COMPARISON:  Chest x-ray 03/13/2018. FINDINGS: There is a right upper extremity PICC with tip terminating in the superior aspect of the right atrium. Lung volumes are low. Elevation of the right hemidiaphragm. Diffuse interstitial prominence and peribronchial cuffing, increased slightly compared to the prior examination. Patchy ill-defined opacities in the right lung base, concerning for airspace consolidation. Small right pleural effusion. No evidence of pulmonary edema. Heart size is normal. The patient is rotated to the right on today's exam, resulting in distortion of the mediastinal contours and reduced diagnostic sensitivity and specificity for mediastinal pathology. Surgical clips throughout the left axillary region, suggesting prior lymph node dissection. IMPRESSION: 1. Worsening diffuse peribronchial cuffing and interstitial prominence, concerning for an acute bronchitis. This is superimposed upon a background of what appears to be chronic interstitial lung disease. 2. Small right pleural effusion. Electronically Signed   By:  Vinnie Langton M.D.   On: 03/15/2018 10:48   [place] Ekg Site Rite  Result Date: 03/14/2018 If Site Rite image not attached, placement could not be confirmed due to current cardiac rhythm.  CULTURES: Blood cultures x2 + Urine cultures  ANTIBIOTICS: Cipro Cefepime  SIGNIFICANT EVENTS: 03/13/18: Admitted  LINES/TUBES: Peripheral IVs Foley 6/22 PICC line  DISCUSSION: 77 year old female with frequent urinary retention admitted with septic shock, sepsis secondary to UTI and acute urinary retention  ASSESSMENT  Severe Sepsis with shock secondary to Pseudomonas bacteremia UTI with hematuria likely Pseudomonas Coumadin induced coagulopathy-INR greater than 10 Hematuria secondary to coagulopathy and severe UTI Acute on chronic renal failure/ AKI Acute on chronic anemia-hemoglobin trending down Chronic atrial fibrillation on Coumadin Hyperkalemia has resolved Emesis Recent left Tibia FX - in cast   PLAN  Hemodynamic monitoring per protocol IV fluids and vasopressors support to maintain MAP > 65 Continue double cover with Cefepime and Cipro pending sensi Maintain Foley in place Trend creatinine and electrolytes Trend procalcitonin adjust antibiotics No pharmacologic DVT prophylaxis SCDs for DVT prophylaxis  Transfuse for HG < 7  FAMILY  - Updates: No family at bedside.  Will update when available Patient remains critically ill.  I have dedicated a total of 38 minutes in critical care time minus all appropriate exclusions.  Cammie Sickle, MD Pulmonary and [place] Pager [phone] or [phone]    03/15/2018, 11:08 AM

## 2018-03-15 NOTE — Clinical Social Work Note (Addendum)
Clinical Social Work Assessment  Patient Details  Name: Tracy Dickerson MRN: [id number] Date of Birth: [birth year]  Date of referral:  03/15/18               Reason for consult:  Other (Comment Required), Facility Placement                Permission sought to share information with:  Facility Art therapist granted to share information::     Name::        Agency::     Relationship::     Contact Information:     Housing/Transportation Living arrangements for the past 2 months:  [place], [place] of Information:  Patient, Medical Team Patient Interpreter Needed:  None Criminal Activity/Legal Involvement Pertinent to Current Situation/Hospitalization:  No - Comment as needed Significant Relationships:  Siblings Lives with:  Siblings(brother Herbie [place]) Do you feel safe going back to the place where you live?  Yes Need for family participation in patient care:  No (Coment)  Care giving concerns:  No concerns Identified at this time   Social Worker assessment / plan:  Patient currently from [facility] for [place].  Has been a facility for approx. 6 weeks. Per Claiborne Billings at [facility] patient is able to return when medically ready.  Met with patient.  She is also in agreement to return to [facility] at discharge.   Employment status:  Disabled (Comment on whether or not currently receiving Disability) Insurance information:  [place]icaid [place], [employer] PT Recommendations:  [place] / Referral to community resources:  (none at this time)  Patient/Family's Response to care:  Patient willing to return to [facility] at D/C to complete therapy.  Patient/Family's Understanding of and Emotional Response to Diagnosis, Current Treatment, and Prognosis:  Patient understands she will remain in the hospital until she is medically ready for discharge. Once medically stable she will return to [facility].  Emotional  Assessment Appearance:  Appears stated age Attitude/Demeanor/Rapport:  Lethargic Affect (typically observed):  Accepting, Calm, Pleasant Orientation:  Oriented to Self, Oriented to Place, Oriented to Situation Alcohol / Substance use:  Not Applicable Psych involvement (Current and /or in the community):  No (Comment)  Discharge Needs  Concerns to be addressed:  Care Coordination, Discharge Planning Concerns Readmission within the last 30 days:  Yes Current discharge risk:  Chronically ill, Dependent with Mobility(Will DC to facility to address risk) Barriers to Discharge:  No Barriers Identified  Will need Medicare UHC Auth prior to returning to [facility].   Maurine Cane, LCSW 03/15/2018, 1:17 PM

## 2018-03-15 NOTE — Progress Notes (Signed)
PHARMACY NOTE:  ANTIMICROBIAL RENAL DOSAGE ADJUSTMENT  Current antimicrobial regimen includes a mismatch between antimicrobial dosage and estimated renal function.  As per policy approved by the Pharmacy & Therapeutics and Medical Executive Committees, the antimicrobial dosage will be adjusted accordingly.  Current antimicrobial dosage:  Cefepime 2g IV q24h  Indication: sepsis  Renal Function:  Estimated Creatinine Clearance: 35.9 mL/min (A) (by C-G formula based on SCr of 1.74 mg/dL (H)). []      On intermittent HD, scheduled: []      On CRRT    Antimicrobial dosage has been changed to:  Cefepime 2g IV q12h  Additional comments:   Thank you for allowing pharmacy to be a part of this patient's care.  Vira Blanco, [facility] 03/15/2018 9:31 AM

## 2018-03-15 NOTE — NC FL2 (Addendum)
[place] LEVEL OF CARE SCREENING TOOL     IDENTIFICATION  Patient Name: Tracy Dickerson Birthdate: 1941-04-20 Sex: female Admission Date (Current Location): 03/13/2018  [place] and [place] Number:  Tracy Dickerson [id number] [place] and Address:  [facility], [address], [place], [place] [address]      Provider Number: [id number]  Attending Physician Name and Address:  Loletha Grayer, MD  Relative Name and Phone Number:  Berneice Heinrich [facility]) [phone]    Current Level of Care: Hospital Recommended Level of Care: [place] Prior Approval Number:    Date Approved/Denied:   PASRR Number: [id number]  Discharge Plan: SNF    Current Diagnoses: Patient Active Problem List   Diagnosis Date Noted  . Hyperkalemia   . Septic shock ([place])   . Sepsis due to urinary tract infection ([place]) 03/13/2018  . Acute kidney injury superimposed on chronic kidney disease ([place]) 02/13/2018  . Closed extra-articular fracture of distal tibia 02/02/2018  . Pneumonia 10/29/2017  . Hypotension 01/26/2017    Orientation RESPIRATION BLADDER Height & Weight     Self, Time, Situation, Place      Weight: 246 lb (111.6 kg) Height:  5\' 6"  (167.6 cm)  BEHAVIORAL SYMPTOMS/MOOD NEUROLOGICAL BOWEL NUTRITION STATUS           AMBULATORY STATUS COMMUNICATION OF NEEDS Skin   Limited Assist Verbally Surgical wounds                       Personal Care Assistance Level of Assistance  Bathing, Feeding, Dressing Bathing Assistance: Limited assistance Feeding assistance: Limited assistance Dressing Assistance: Limited assistance     Functional Limitations Info  Sight, Hearing, Speech Sight Info: Adequate Hearing Info: Adequate Speech Info: Adequate    SPECIAL CARE FACTORS FREQUENCY  OT (By licensed OT), PT (By licensed PT)(.)     PT Frequency: 5 times weekly OT Frequency: 3 times weekly            Contractures Contractures Info:  Not present    Additional Factors Info  Code Status, Allergies Code Status Info: full Allergies Info: Allergies Info: Percocet Oxycodone-acetaminophen, Penicillins           Current Medications (03/15/2018):  This is the current hospital active medication list Current Facility-Administered Medications  Medication Dose Route Frequency Provider Last Rate Last Dose  . 0.9 %  sodium chloride infusion   Intravenous Continuous Pyreddy, Reatha Harps, MD 125 mL/hr at 03/13/18 2230    . acetaminophen (TYLENOL) tablet 650 mg  650 mg Oral Q6H PRN Saundra Shelling, MD       Or  . acetaminophen (TYLENOL) suppository 650 mg  650 mg Rectal Q6H PRN Pyreddy, Reatha Harps, MD      . atorvastatin (LIPITOR) tablet 80 mg  80 mg Oral Daily Pyreddy, Pavan, MD   80 mg at 03/14/18 1013  . bisacodyl (DULCOLAX) suppository 10 mg  10 mg Rectal Daily PRN Pyreddy, Pavan, MD      . ceFEPIme (MAXIPIME) 2 g in sodium chloride 0.9 % 100 mL IVPB  2 g Intravenous Q12H Vira Blanco, RPH   Stopped at 03/15/18 1052  . chlorhexidine (PERIDEX) 0.12 % solution 15 mL  15 mL Mouth Rinse BID [place] Dragon, MD   15 mL at 03/15/18 0845  . ciprofloxacin (CIPRO) IVPB 200 mg  200 mg Intravenous Q24H [place] Dragon, MD   Stopped at 03/15/18 0045  . ferrous sulfate tablet 325 mg  325 mg  Oral Daily Saundra Shelling, MD   325 mg at 03/14/18 1013  . gabapentin (NEURONTIN) capsule 300 mg  300 mg Oral QHS Saundra Shelling, MD   300 mg at 03/14/18 2204  . hydrocortisone sodium succinate (SOLU-CORTEF) 100 MG injection 50 mg  50 mg Intravenous Q8H [place] Dragon, MD   50 mg at 03/15/18 1222  . insulin aspart (novoLOG) injection 0-9 Units  0-9 Units Subcutaneous Q4H [place] Dragon, MD   2 Units at 03/15/18 1223  . ipratropium-albuterol (DUONEB) 0.5-2.5 (3) MG/3ML nebulizer solution 3 mL  3 mL Nebulization Q6H Tukov-Yual, Magdalene S, NP   3 mL at 03/15/18 0736  . MEDLINE mouth rinse  15 mL Mouth Rinse q12n4p [place] Dragon, MD   15 mL at 03/15/18 1239   . norepinephrine (LEVOPHED) 4mg  in D5W 238mL premix infusion  0-40 mcg/min Intravenous Titrated [place] Dragon, MD 56.3 mL/hr at 03/15/18 1234 15 mcg/min at 03/15/18 1234  . ondansetron (ZOFRAN) tablet 4 mg  4 mg Oral Q6H PRN Pyreddy, Reatha Harps, MD       Or  . ondansetron (ZOFRAN) injection 4 mg  4 mg Intravenous Q6H PRN Pyreddy, Pavan, MD      . pantoprazole (PROTONIX) EC tablet 40 mg  40 mg Oral Daily Pyreddy, Pavan, MD   40 mg at 03/14/18 1013  . polyethylene glycol (MIRALAX / GLYCOLAX) packet 17 g  17 g Oral Daily PRN Pyreddy, Reatha Harps, MD      . pramipexole (MIRAPEX) tablet 0.25 mg  0.25 mg Oral QHS Pyreddy, Reatha Harps, MD   0.25 mg at 03/14/18 2204     Discharge Medications: Please see discharge summary for a list of discharge medications.  Relevant Imaging Results:  Relevant Lab Results:   Additional Information    Maurine Cane, LCSW

## 2018-03-15 NOTE — Progress Notes (Addendum)
Witnessed aspiration of milk-pt with a very weak, nonproductive cough. Pt encouraged to give strong coughs. Lung sounds- RUL fine crackles, rhonchi; LUL rhonchi. Bilaterally diminished in the bases->Dr Celesta Aver made aware-CXR ordered, NPO until further notice

## 2018-03-16 DIAGNOSIS — D649 Anemia, unspecified: Secondary | ICD-10-CM

## 2018-03-16 DIAGNOSIS — I482 Chronic atrial fibrillation: Secondary | ICD-10-CM

## 2018-03-16 LAB — CBC WITH DIFFERENTIAL/PLATELET
Basophils Absolute: 0 10*3/uL (ref 0–0.1)
Basophils Relative: 0 %
Eosinophils Absolute: 0 10*3/uL (ref 0–0.7)
Eosinophils Relative: 0 %
HCT: 21.2 % — ABNORMAL LOW (ref 35.0–47.0)
Hemoglobin: 6.7 g/dL — ABNORMAL LOW (ref 12.0–16.0)
Lymphocytes Relative: 6 %
Lymphs Abs: 0.5 10*3/uL — ABNORMAL LOW (ref 1.0–3.6)
MCH: 25.7 pg — ABNORMAL LOW (ref 26.0–34.0)
MCHC: 31.6 g/dL — ABNORMAL LOW (ref 32.0–36.0)
MCV: 81.2 fL (ref 80.0–100.0)
Monocytes Absolute: 0.2 10*3/uL (ref 0.2–0.9)
Monocytes Relative: 2 %
Neutro Abs: 8.6 10*3/uL — ABNORMAL HIGH (ref 1.4–6.5)
Neutrophils Relative %: 92 %
Platelets: 252 10*3/uL (ref 150–440)
RBC: 2.62 MIL/uL — ABNORMAL LOW (ref 3.80–5.20)
RDW: 22.7 % — ABNORMAL HIGH (ref 11.5–14.5)
WBC: 9.4 10*3/uL (ref 3.6–11.0)

## 2018-03-16 LAB — CULTURE, BLOOD (ROUTINE X 2)
Special Requests: ADEQUATE
Special Requests: ADEQUATE

## 2018-03-16 LAB — GLUCOSE, CAPILLARY
Glucose-Capillary: 171 mg/dL — ABNORMAL HIGH (ref 65–99)
Glucose-Capillary: 131 mg/dL — ABNORMAL HIGH (ref 65–99)
Glucose-Capillary: 127 mg/dL — ABNORMAL HIGH (ref 65–99)
Glucose-Capillary: 137 mg/dL — ABNORMAL HIGH (ref 65–99)
Glucose-Capillary: 160 mg/dL — ABNORMAL HIGH (ref 65–99)
Glucose-Capillary: 214 mg/dL — ABNORMAL HIGH (ref 65–99)

## 2018-03-16 LAB — RENAL FUNCTION PANEL
Albumin: 1.4 g/dL — ABNORMAL LOW (ref 3.5–5.0)
Anion gap: 5 (ref 5–15)
BUN: 61 mg/dL — ABNORMAL HIGH (ref 6–20)
CO2: 16 mmol/L — ABNORMAL LOW (ref 22–32)
Calcium: 7.4 mg/dL — ABNORMAL LOW (ref 8.9–10.3)
Chloride: 120 mmol/L — ABNORMAL HIGH (ref 101–111)
Creatinine, Ser: 1.32 mg/dL — ABNORMAL HIGH (ref 0.44–1.00)
GFR calc Af Amer: 45 mL/min — ABNORMAL LOW (ref >60)
GFR calc non Af Amer: 39 mL/min — ABNORMAL LOW (ref >60)
Glucose, Bld: 144 mg/dL — ABNORMAL HIGH (ref 65–99)
Phosphorus: 4 mg/dL (ref 2.5–4.6)
Potassium: 4.2 mmol/L (ref 3.5–5.1)
Sodium: 141 mmol/L (ref 135–145)

## 2018-03-16 LAB — PROCALCITONIN: Procalcitonin: 3.02 ng/mL

## 2018-03-16 LAB — HEMOGLOBIN AND HEMATOCRIT, BLOOD
HCT: 25.5 % — ABNORMAL LOW (ref 35.0–47.0)
Hemoglobin: 8.3 g/dL — ABNORMAL LOW (ref 12.0–16.0)

## 2018-03-16 LAB — PREPARE RBC (CROSSMATCH)

## 2018-03-16 MED ORDER — CEFEPIME HCL 1 G IJ SOLR
1.0000 g | Freq: Two times a day (BID) | INTRAMUSCULAR | Status: DC
  Administered 2018-03-16: 1 g via INTRAVENOUS
  Filled 2018-03-16 (×4): qty 1

## 2018-03-16 MED ORDER — CIPROFLOXACIN IN D5W 400 MG/200ML IV SOLN
400.0000 mg | Freq: Two times a day (BID) | INTRAVENOUS | Status: DC
  Administered 2018-03-16 – 2018-03-17 (×3): 400 mg via INTRAVENOUS
  Filled 2018-03-16 (×6): qty 200

## 2018-03-16 MED ORDER — CIPROFLOXACIN IN D5W 400 MG/200ML IV SOLN
400.0000 mg | INTRAVENOUS | Status: DC
  Filled 2018-03-16: qty 200

## 2018-03-16 MED ORDER — SODIUM CHLORIDE 0.9% IV SOLUTION
Freq: Once | INTRAVENOUS | Status: AC
  Administered 2018-03-16: 09:00:00 via INTRAVENOUS

## 2018-03-16 MED ORDER — FOLIC ACID 1 MG PO TABS
1.0000 mg | ORAL_TABLET | Freq: Every day | ORAL | Status: DC
  Administered 2018-03-16 – 2018-03-20 (×5): 1 mg via ORAL
  Filled 2018-03-16 (×5): qty 1

## 2018-03-16 NOTE — Progress Notes (Signed)
PULMONARY / CRITICAL CARE MEDICINE   Name: Tracy Dickerson MRN: [id number] DOB: 1940-11-03    ADMISSION DATE:  03/13/2018   CONSULTATION DATE:  03/13/2018  REFERRING MD: Dr. Estanislado Pandy  Reason: Septic shock  HISTORY OF PRESENT ILLNESS:   This is a 77 year old SNF patient who presented to the ED from [facility] with complaints of weakness, fever and tachycardia.  Weakness have progressively gotten worse over the course of a week and today patient spiked a fever of 102 F hence EMS was called.  Upon EMS arrival, patient was on oxygen with a stable SPO2 but complained of shortness of breath and diffuse abdominal pain and weakness.  At the ED, she was brought in for sepsis.  She had WBC was 22 . 6K, hemoglobin of 8.6 INR greater than 10, creatinine of 3.1, BUN of 92 and a urinalysis that is positive for nitrites, leukocyte esterase bacteria and WBCs.  She has been given vitamin K, IV fluids and started on pressors.  She is being admitted to the ICU for management of urosepsis and septic shock. Prior to ED presentation, patient was seen 2 weeks ago by urology for urinary retention.  At the ED her bladder scan revealed more than 849 mL sensitive Foley catheter was placed and 850 cc of purulent urine was drained.   REVIEW OF SYSTEMS:   Constitutional: apyrexial HENT: Negative for congestion and rhinorrhea.  Eyes: Negative for redness and visual disturbance.  Respiratory: no cough Cardiovascular: Negative for chest pain and palpitations.  Gastrointestinal: Episode of emesis, epigastric discomfort  Genitourinary: negative Endocrine: Denies polyuria, polyphagia and heat intolerance Musculoskeletal: Negative for myalgias and arthralgias.  Skin: Negative for pallor and wound.  Neurological: Negative for dizziness and headaches   SUBJECTIVE:  Patient reported that she felt better but needed a laxative for a BM VITAL SIGNS: BP (!) 117/57   Pulse 85   Temp 97.9 F (36.6 C) (Axillary)   Resp  18   Ht 5\' 6"  (1.676 m)   Wt 246 lb (111.6 kg)   SpO2 97%   BMI 39.71 kg/m   HEMODYNAMICS:  still on vasopressor Hypotensive on vasopressor  OXYGEN: 2 liters [place]   INTAKE / OUTPUT: I/O last 3 completed shifts: In: 9089.2 [P.O.:240; I.V.:8247.6; IV Piggyback:601.7] Out: 1915 [Urine:1915]  PHYSICAL EXAMINATION: General: comfortable in bed Neuro: Alert and oriented x3, moves all extremities, follows commands HEENT: PERRLA, no JVD, trachea midline Cardiovascular: Apical pulse tachycardic, S1-S2, no murmur regurg or gallop, +2 pulses Lungs: Bilateral breath sounds, diminished in the bases, no wheezes or rhonchi Abdomen:  normal bowel sounds, non tender Musculoskeletal: Positive range of motion in upper and lower extremities, left lower extremity cast Skin: Warm and dry  LABS:  BMET Recent Labs  Lab 03/14/18 0334 03/15/18 0539 03/16/18 0546  NA 140 140 141  K 4.4 4.3 4.2  CL 112* 117* 120*  CO2 19* 17* 16*  BUN 87* 75* 61*  CREATININE 2.40* 1.74* 1.32*  GLUCOSE 154* 118* 144*    Electrolytes Recent Labs  Lab 03/13/18 2210 03/14/18 0334 03/15/18 0539 03/16/18 0546  CALCIUM 7.4* 7.4* 7.3* 7.4*  MG 2.7*  --   --   --   PHOS 4.3  --  4.4 4.0    CBC Recent Labs  Lab 03/14/18 0334 03/15/18 0539 03/16/18 0546  WBC 22.5* 19.6* 9.4  HGB 7.5* 7.8* 6.7*  HCT 23.7* 25.2* 21.2*  PLT 331 392 252    Coag's Recent Labs  Lab 03/13/18  1446 03/13/18 2210 03/14/18 0334  INR >10.00* 2.61 1.93    Sepsis Markers Recent Labs  Lab 03/13/18 1427 03/13/18 2209 03/14/18 0334 03/14/18 0941 03/15/18 0539 03/16/18 0546  LATICACIDVEN 1.9 1.7 1.0  --   --   --   PROCALCITON  --   --   --  8.22 5.46 3.02    ABG No results for input(s): PHART, PCO2ART, PO2ART in the last 168 hours.  Liver Enzymes Recent Labs  Lab 03/13/18 1446 03/13/18 2210 03/15/18 0539 03/15/18 1002 03/16/18 0546  AST 70* 69*  --  41  --   ALT 28 28  --  17  --   ALKPHOS 137* 153*  --   106  --   BILITOT 0.8 1.0  --  0.6  --   ALBUMIN 1.9* 2.0* 1.6* 1.2* 1.4*    Cardiac Enzymes Recent Labs  Lab 03/13/18 2210 03/14/18 0334 03/14/18 0941  TROPONINI 0.06* 0.04* 0.03*    Glucose Recent Labs  Lab 03/15/18 1943 03/15/18 2120 03/15/18 2337 03/16/18 0307 03/16/18 0738 03/16/18 1145  GLUCAP 163* 171* 167* 131* 127* 137*    Imaging No results found. CULTURES: Blood cultures x2 + Urine cultures  ANTIBIOTICS: Cipro Cefepime  SIGNIFICANT EVENTS: 03/13/18: Admitted  LINES/TUBES: Peripheral IVs Foley 6/22 PICC line  DISCUSSION: 77 year old female with frequent urinary retention admitted with septic shock, sepsis secondary to UTI and acute urinary retention  ASSESSMENT  Severe Sepsis with shock has resolved as Pt off vasopressors Pseudomonas bacteremia- repeated cultures neg thus far UTI with hematuria likely Pseudomonas Urinary retention seen by Urologist and foley placed Coumadin induced coagulopathy-INR greater than 10 Hematuria secondary to coagulopathy and severe UTI Acute on chronic renal failure/ AKI much improved Acute on chronic anemia-hemoglobin trended down with no obvious blood loss Chronic atrial fibrillation on Coumadin Hyperkalemia has resolved Emesis no further Recent left Tibia FX - in cast   PLAN  IV fluids and vasopressors support to maintain MAP > 65 Continue double cover with Cefepime and Cipro since bacteremia Maintain Foley in place until D/C and seen by urology as OP Trend creatinine and electrolytes Trend procalcitonin adjust antibiotics No pharmacologic DVT prophylaxis SCDs for DVT prophylaxis Transfuse  To keep  HG < 7  FAMILY  - Updates: Brother updated at bedside    Cammie Sickle, MD Pulmonary and [place] Pager [phone] or [phone]    03/16/2018, 12:11 PM

## 2018-03-16 NOTE — Evaluation (Signed)
Clinical/Bedside Swallow Evaluation Patient Details  Name: Tracy Dickerson MRN: [id number] Date of Birth: May 05, 1941  Today's Date: 03/16/2018 Time: SLP Start Time (ACUTE ONLY): 1000 SLP Stop Time (ACUTE ONLY): 1030 SLP Time Calculation (min) (ACUTE ONLY): 30 min  Past Medical History:  Past Medical History:  Diagnosis Date  . Cancer ([place])    Breast and lung  . Diabetes mellitus without complication ([place])   . GERD (gastroesophageal reflux disease)   . Heart disease   . Hypertension    Past Surgical History:  Past Surgical History:  Procedure Laterality Date  . ABDOMINAL HYSTERECTOMY    . APPENDECTOMY    . BREAST SURGERY     breast cancer LEFT  . OPEN REDUCTION INTERNAL FIXATION (ORIF) TIBIA/FIBULA FRACTURE Left 02/03/2018   Procedure: OPEN REDUCTION INTERNAL FIXATION (ORIF) DISTAL TIBIA FRACTURE;  Surgeon: Corky Mull, MD;  Location: [facility] ORS;  Service: Orthopedics;  Laterality: Left;  ankle/lower leg  . TONSILLECTOMY     HPI:  77 y.o. female with a known history of breast cancer, diabetes mellitus type 2, GERD, hypertension presented to from the [facility] facility for lethargy and fever.  Agent on oral Coumadin for anticoagulation at the facility.  She was found to have fever of 102 F in the nursing home.  Patient has foul-smelling urine also lower abdominal discomfort.  She has weakness and fatigue.  Patient is on oxygen at the facility.  Patient was evaluated in the emergency room was found to have urinary tract infection and sepsis.  Her blood pressure was low initially when she presented to the emergency room and she was resuscitated with IV fluids.  She was started on broad-spectrum IV antibiotics and urinalysis showed infection.  Patient's INR was more than 10 and she received IV vitamin K.  Pt was made NPO yesterday after choking on milk and demonstrating wet vocal quality.   Assessment / Plan / Recommendation Clinical Impression  Pt is a 77 y/o female that presents  with oral dysphagia. Pt given trials of ice chips, thin liquid via cup and straw, puree and solid consistencies. No overt s/s of aspiration were observed with any tested consistency and O2 and RR remained stable throughout. Pt was noted to have slight wet vocal quality following sips of thin by straw but this was not observed consistently. Oral phase was [facility] for all tested consistencies except for solid. Pt is edentulous and her dentures were left at SNF and currently unavailable. Pt demonstrated difficulty masticating and clearing solid consistency d/t she is edentulous. Pt was able to eventually clear solid w/f/u tsps of puree to soften consistency. No pocketing or holding was observed. Oral mech exam revealed oral motor abilities to be [facility]. Pt is judged to be mild-moderate aspiration risk d/t oral dyspahgia and overall medical status. Recommend a Dyspahgia III diet w/thin liquids (no straws, meds whole in puree, aspiration precautions). Educated pt on aspiration precautions and diet recomendations. Will f/u  re: toleration of diet in 1-2 days.  SLP Visit Diagnosis: Dysphagia, oral phase (R13.11)    Aspiration Risk  Mild aspiration risk;Moderate aspiration risk    Diet Recommendation Dysphagia 3 (Mech soft);Thin liquid   Liquid Administration via: Cup;No straw Medication Administration: Whole meds with puree Supervision: Patient able to self feed Compensations: Minimize environmental distractions;Slow rate;Small sips/bites Postural Changes: Seated upright at 90 degrees    Other  Recommendations Oral Care Recommendations: Oral care BID;Other (Comment)(Staff to assist with oral care)   Follow up Recommendations Skilled Nursing  facility      Frequency and Duration min 2x/week  1 week       Prognosis Prognosis for Safe Diet Advancement: Good      Swallow Study   General Date of Onset: 03/15/18 HPI: 77 y.o. female with a known history of breast cancer, diabetes mellitus type 2, GERD,  hypertension presented to from the [place] facility for lethargy and fever.  Agent on oral Coumadin for anticoagulation at the facility.  She was found to have fever of 102 F in the nursing home.  Patient has foul-smelling urine also lower abdominal discomfort.  She has weakness and fatigue.  Patient is on oxygen at the facility.  Patient was evaluated in the emergency room was found to have urinary tract infection and sepsis.  Her blood pressure was low initially when she presented to the emergency room and she was resuscitated with IV fluids.  She was started on broad-spectrum IV antibiotics and urinalysis showed infection.  Patient's INR was more than 10 and she received IV vitamin K.  Pt was made NPO yesterday after choking on milk and demonstrating wet vocal quality. Type of Study: Bedside Swallow Evaluation Previous Swallow Assessment: Pt was observed by nsg choking on thin milk Diet Prior to this Study: NPO Temperature Spikes Noted: No Respiratory Status: Room air History of Recent Intubation: No Behavior/Cognition: Alert;Cooperative Oral Cavity Assessment: Dry Oral Care Completed by SLP: No Oral Cavity - Dentition: Edentulous;Dentures, not available Vision: Functional for self-feeding Self-Feeding Abilities: Able to feed self Patient Positioning: Upright in bed Baseline Vocal Quality: Normal Volitional Cough: Strong Volitional Swallow: Able to elicit    Oral/Motor/Sensory Function Overall Oral Motor/Sensory Function: Within functional limits   Ice Chips Ice chips: Within functional limits Presentation: Spoon Other Comments: 2 tsps   Thin Liquid Thin Liquid: Within functional limits Presentation: Straw;Self Fed;Cup Other Comments: 16 ounces    Nectar Thick Nectar Thick Liquid: Not tested   Honey Thick Honey Thick Liquid: Not tested   Puree Puree: Within functional limits Presentation: Spoon Other Comments: 4 tsps   Solid   GO   Solid: Impaired Presentation:  Self Fed Oral Phase Impairments: Impaired mastication(Pt edentulous and dentures not available) Oral Phase Functional Implications: Impaired mastication Pharyngeal Phase Impairments: (None)        [place],Tracy Dickerson 03/16/2018,2:11 PM

## 2018-03-16 NOTE — Care Management (Signed)
Patient had ORIF to LLE on 02/03/18 with Dr. Roland Rack. Per office note on 02/20/18 patient remained Non Weight Bearing to LLE and in cast with instructions to follow up with Dr. Roland Rack in 4 weeks to re-evaluate.  Attending requests PT eval. Patient, per Mia [place] PA to remain non-weightbearing until ortho eval. Ortho consult placed as this is week 4.

## 2018-03-16 NOTE — Progress Notes (Signed)
Patient ID: Tracy Dickerson, female   DOB: 26-Aug-1941, 77 y.o.   MRN: [id number]  [facility] PROGRESS NOTE  JACQLYN MAROLF ION:[id number] DOB: 27-Feb-1941 DOA: 03/13/2018 PCP: Boykin Nearing, MD  HPI/Subjective: Patient feeling better.  Off pressors to lift up blood pressure.  Breathing better.  No abdominal pain today  Objective: Vitals:   03/16/18 1334 03/16/18 1400  BP:  (!) 108/49  Pulse: (!) 102 92  Resp: 18 19  Temp: 97.9 F (36.6 C)   SpO2: 98% 98%    Filed Weights   03/13/18 1423  Weight: 111.6 kg (246 lb)    ROS: Review of Systems  Constitutional: Negative for chills and fever.  Eyes: Negative for blurred vision.  Respiratory: Negative for cough and shortness of breath.   Cardiovascular: Negative for chest pain.  Gastrointestinal: Negative for abdominal pain, constipation, diarrhea, nausea and vomiting.  Genitourinary: Negative for dysuria.  Musculoskeletal: Negative for joint pain.  Neurological: Negative for dizziness and headaches.   Exam: Physical Exam  HENT:  Nose: No mucosal edema.  Mouth/Throat: No oropharyngeal exudate or posterior oropharyngeal edema.  Eyes: Pupils are equal, round, and reactive to light. Conjunctivae, EOM and lids are normal.  Neck: No JVD present. Carotid bruit is not present. No edema present. No thyroid mass and no thyromegaly present.  Cardiovascular: S1 normal and S2 normal. Exam reveals no gallop.  No murmur heard. Pulses:      Dorsalis pedis pulses are 2+ on the right side, and 2+ on the left side.  Respiratory: No respiratory distress. She has no wheezes. She has no rhonchi. She has no rales.  GI: Soft. Bowel sounds are normal. There is no tenderness.  Musculoskeletal:       Right ankle: She exhibits swelling.       Left ankle: She exhibits swelling.  Cast on left foot.  Lymphadenopathy:    She has no cervical adenopathy.  Neurological: She is alert. No cranial nerve deficit.  Skin: Skin is warm. No rash noted.  Nails show no clubbing.  Psychiatric: She has a normal mood and affect.      Data Reviewed: Basic Metabolic Panel: Recent Labs  Lab 03/13/18 1446 03/13/18 2210 03/14/18 0334 03/15/18 0539 03/16/18 0546  NA 137 139 140 140 141  K 5.7* 4.9 4.4 4.3 4.2  CL 107 110 112* 117* 120*  CO2 18* 19* 19* 17* 16*  GLUCOSE 140* 152* 154* 118* 144*  BUN 92* 91* 87* 75* 61*  CREATININE 3.11* 2.73* 2.40* 1.74* 1.32*  CALCIUM 8.0* 7.4* 7.4* 7.3* 7.4*  MG  --  2.7*  --   --   --   PHOS  --  4.3  --  4.4 4.0   Liver Function Tests: Recent Labs  Lab 03/13/18 1446 03/13/18 2210 03/15/18 0539 03/15/18 1002 03/16/18 0546  AST 70* 69*  --  41  --   ALT 28 28  --  17  --   ALKPHOS 137* 153*  --  106  --   BILITOT 0.8 1.0  --  0.6  --   PROT 7.2 7.4  --  5.7*  --   ALBUMIN 1.9* 2.0* 1.6* 1.2* 1.4*   CBC: Recent Labs  Lab 03/13/18 1446 03/13/18 2210 03/14/18 0334 03/15/18 0539 03/16/18 0546 03/16/18 1256  WBC 22.6* 27.1* 22.5* 19.6* 9.4  --   NEUTROABS 20.7*  --   --  17.3* 8.6*  --   HGB 8.6* 8.5* 7.5* 7.8* 6.7* 8.3*  HCT 25.9* 26.8* 23.7* 25.2* 21.2* 25.5*  MCV 90.1 82.8 82.3 82.0 81.2  --   PLT 434 396 331 392 252  --    Cardiac Enzymes: Recent Labs  Lab 03/13/18 2210 03/14/18 0334 03/14/18 0941  TROPONINI 0.06* 0.04* 0.03*    CBG: Recent Labs  Lab 03/15/18 2120 03/15/18 2337 03/16/18 0307 03/16/18 0738 03/16/18 1145  GLUCAP 171* 167* 131* 127* 137*    Recent Results (from the past 240 hour(s))  Blood Culture (routine x 2)     Status: Abnormal   Collection Time: 03/13/18  2:27 PM  Result Value Ref Range Status   Specimen Description   Final    BLOOD R HAND Performed at [facility], [address], [place], [place] [address]    Special Requests   Final    BOTTLES DRAWN AEROBIC AND ANAEROBIC Blood Culture adequate volume Performed at [facility], [address] [place], [place] [address]    Culture  Setup Time   Final    GRAM  NEGATIVE RODS AEROBIC BOTTLE ONLY CRITICAL RESULT CALLED TO, READ BACK BY AND VERIFIED WITH: LISA KLUTTZ ON 03/14/18 AT 4765 QSD Performed at [facility], [address] [place], [place] [address]    Culture (A)  Final    PSEUDOMONAS AERUGINOSA SUSCEPTIBILITIES PERFORMED ON PREVIOUS CULTURE WITHIN THE LAST 5 DAYS. Performed at [facility], [place] [address] [place], [place] [address]    Report Status 03/16/2018 FINAL  Final  Blood Culture (routine x 2)     Status: Abnormal   Collection Time: 03/13/18  2:27 PM  Result Value Ref Range Status   Specimen Description   Final    BLOOD L HAND Performed at [facility], [address] [place], [place] [address]    Special Requests   Final    BOTTLES DRAWN AEROBIC AND ANAEROBIC Blood Culture adequate volume Performed at [facility], [address] [place], [place] [address]    Culture  Setup Time   Final    Organism ID to follow [place] ONLY CRITICAL RESULT CALLED TO, READ BACK BY AND VERIFIED WITH: LISA KLUTTZ ON 03/14/18 AT 0850 QSD Performed at [facility], [address] [place], [place] [address]    Culture PSEUDOMONAS AERUGINOSA (A)  Final   Report Status 03/16/2018 FINAL  Final   Organism ID, Bacteria PSEUDOMONAS AERUGINOSA  Final      Susceptibility   Pseudomonas aeruginosa - MIC*    CEFTAZIDIME 4 SENSITIVE Sensitive     CIPROFLOXACIN <=0.25 SENSITIVE Sensitive     GENTAMICIN <=1 SENSITIVE Sensitive     IMIPENEM 1 SENSITIVE Sensitive     PIP/TAZO 8 SENSITIVE Sensitive     CEFEPIME 2 SENSITIVE Sensitive     * PSEUDOMONAS AERUGINOSA  Blood Culture ID Panel (Reflexed)     Status: Abnormal   Collection Time: 03/13/18  2:27 PM  Result Value Ref Range Status   Enterococcus species NOT DETECTED NOT DETECTED Final   Listeria monocytogenes NOT DETECTED NOT DETECTED Final   Staphylococcus species NOT DETECTED NOT DETECTED Final   Staphylococcus aureus NOT  DETECTED NOT DETECTED Final   Streptococcus species NOT DETECTED NOT DETECTED Final   Streptococcus agalactiae NOT DETECTED NOT DETECTED Final   Streptococcus pneumoniae NOT DETECTED NOT DETECTED Final   Streptococcus pyogenes NOT DETECTED NOT DETECTED Final   Acinetobacter baumannii NOT DETECTED NOT DETECTED Final   Enterobacteriaceae species NOT DETECTED NOT DETECTED  Final   Enterobacter cloacae complex NOT DETECTED NOT DETECTED Final   Escherichia coli NOT DETECTED NOT DETECTED Final   Klebsiella oxytoca NOT DETECTED NOT DETECTED Final   Klebsiella pneumoniae NOT DETECTED NOT DETECTED Final   Proteus species NOT DETECTED NOT DETECTED Final   Serratia marcescens NOT DETECTED NOT DETECTED Final   Carbapenem resistance NOT DETECTED NOT DETECTED Final   Haemophilus influenzae NOT DETECTED NOT DETECTED Final   Neisseria meningitidis NOT DETECTED NOT DETECTED Final   Pseudomonas aeruginosa DETECTED (A) NOT DETECTED Final    Comment: CRITICAL RESULT CALLED TO, READ BACK BY AND VERIFIED WITH: LISA KLUTTZ ON 03/14/18 AT 0850 QSD    Candida albicans NOT DETECTED NOT DETECTED Final   Candida glabrata NOT DETECTED NOT DETECTED Final   Candida krusei NOT DETECTED NOT DETECTED Final   Candida parapsilosis NOT DETECTED NOT DETECTED Final   Candida tropicalis NOT DETECTED NOT DETECTED Final    Comment: Performed at [facility], [address], [place], [place] [address]  Urine culture     Status: Abnormal (Preliminary result)   Collection Time: 03/13/18  2:40 PM  Result Value Ref Range Status   Specimen Description   Final    URINE, RANDOM Performed at [facility], [address], [place], [place] [address]    Special Requests   Final    NONE Performed at [facility], [address] [place], [place] [address]    Culture (A)  Final    >=100,000 COLONIES/mL GRAM NEGATIVE RODS 80,000 COLONIES/mL ESCHERICHIA COLI SUSCEPTIBILITIES TO FOLLOW Performed at [facility], [place] [address], [place], [place] [address]    Report Status PENDING  Incomplete  MRSA PCR Screening     Status: None   Collection Time: 03/13/18  9:27 PM  Result Value Ref Range Status   MRSA by PCR NEGATIVE NEGATIVE Final    Comment:        The GeneXpert MRSA Assay (FDA approved for NASAL specimens only), is one component of a comprehensive MRSA colonization surveillance program. It is not intended to diagnose MRSA infection nor to guide or monitor treatment for MRSA infections. Performed at [facility], [address] [place], [place] [address]   CULTURE, BLOOD (ROUTINE X 2) w Reflex to ID Panel     Status: None (Preliminary result)   Collection Time: 03/15/18  9:32 PM  Result Value Ref Range Status   Specimen Description BLOOD RIGHT HAND  Final   Special Requests   Final    BOTTLES DRAWN AEROBIC AND ANAEROBIC Blood Culture adequate volume   Culture   Final    NO GROWTH < 12 HOURS Performed at [facility], [address] [place], [place] [address]    Report Status PENDING  Incomplete  CULTURE, BLOOD (ROUTINE X 2) w Reflex to ID Panel     Status: None (Preliminary result)   Collection Time: 03/15/18  9:41 PM  Result Value Ref Range Status   Specimen Description BLOOD BLOOD LEFT ARM  Final   Special Requests   Final    BOTTLES DRAWN AEROBIC AND ANAEROBIC Blood Culture adequate volume   Culture   Final    NO GROWTH < 12 HOURS Performed at [facility], [address], [place],  [address]    Report Status PENDING  Incomplete     Studies: Dg Abd 1 View  Result Date: 03/15/2018 CLINICAL DATA:  Abdominal pain EXAM: ABDOMEN - 1 VIEW COMPARISON:  03/13/2018 FINDINGS: Scattered gas-filled nondilated small and large bowel. No evidence of obstruction. No radiopaque stones. Degenerative changes in the spine. Suggestion of infiltration or atelectasis in the right lung base. IMPRESSION: 1. Nonobstructive bowel gas pattern. 2.  Probable infiltration or atelectasis in the right lung base. Electronically Signed   By: Lucienne Capers M.D.   On: 03/15/2018 03:08   Dg Chest Port 1 View  Result Date: 03/15/2018 CLINICAL DATA:  77 year old female with history of weakness and nonproductive cough. EXAM: PORTABLE CHEST 1 VIEW COMPARISON:  Chest x-ray 03/13/2018. FINDINGS: There is a right upper extremity PICC with tip terminating in the superior aspect of the right atrium. Lung volumes are low. Elevation of the right hemidiaphragm. Diffuse interstitial prominence and peribronchial cuffing, increased slightly compared to the prior examination. Patchy ill-defined opacities in the right lung base, concerning for airspace consolidation. Small right pleural effusion. No evidence of pulmonary edema. Heart size is normal. The patient is rotated to the right on today's exam, resulting in distortion of the mediastinal contours and reduced diagnostic sensitivity and specificity for mediastinal pathology. Surgical clips throughout the left axillary region, suggesting prior lymph node dissection. IMPRESSION: 1. Worsening diffuse peribronchial cuffing and interstitial prominence, concerning for an acute bronchitis. This is superimposed upon a background of what appears to be chronic interstitial lung disease. 2. Small right pleural effusion. Electronically Signed   By: Vinnie Langton M.D.   On: 03/15/2018 10:48    Scheduled Meds: . atorvastatin  80 mg Oral Daily  . chlorhexidine  15 mL Mouth Rinse BID  . ferrous sulfate  325 mg Oral Daily  . folic acid  1 mg Oral Daily  . gabapentin  300 mg Oral QHS  . hydrocortisone sod succinate (SOLU-CORTEF) inj  50 mg Intravenous Q8H  . insulin aspart  0-9 Units Subcutaneous Q4H  . ipratropium-albuterol  3 mL Nebulization Q6H  . mouth rinse  15 mL Mouth Rinse q12n4p  . pantoprazole  40 mg Oral Daily  . pramipexole  0.25 mg Oral QHS   Continuous Infusions: . ceFEPime (MAXIPIME) IV    . ciprofloxacin       Assessment/Plan:  1. Septic shock with Pseudomonas.  Sources acute cystitis with hematuria.  Patient will go home with Foley catheter.   currently off pressors.  Cefepime and Cipro as per critical care specialist..  Hold antihypertensive medications.  Patient  will hopefully transfer out of the ICU today.  White blood cell count has trended into the normal range. 2. Acute kidney injury on chronic kidney disease stage III.  Secondary to septic shock.  IV fluid hydration.  Creatinine has improved to 1.32. 3. Hyperlipidemia unspecified on atorvastatin 4. diabetic neuropathy on gabapentin 5. GERD on Protonix 6. Restless leg syndrome on Mirapex 7. Obesity.  Weight loss needed. 8. Atrial fibrillation.   hopefully will be able to restart Coumadin tomorrow 9. Anemia unspecified.  Patient transfuse 1 unit of packed red blood cells on a hemoglobin of 6.7.  Repeat hemoglobin 8.3.  Code Status:     Code Status Orders  (From admission, onward)        Start     Ordered   03/13/18 2021  Full code  Continuous     03/13/18 2020    Code Status History    Date Active Date Inactive Code Status Order ID Comments User Context   02/13/2018 2026 02/16/2018 1637 Full Code [id number]  Gorden Harms, MD Inpatient   02/02/2018 2159 02/03/2018 2049 Full Code  [id number]  Corky Mull, MD Inpatient   10/29/2017 1816 10/31/2017 1816 DNR [id number]  Hillary Bow, MD ED   01/26/2017 2250 01/27/2017 1427 Full Code [id number]  Henreitta Leber, MD ED     Family Communication: As per critical care specialist Disposition Plan: To be determined  Consultants:  Critical care specialist  Antibiotics:  Cefepime  Cipro  Time spent: 26 minutes  [place]

## 2018-03-16 NOTE — Consult Note (Signed)
ORTHOPAEDIC CONSULTATION  REQUESTING PHYSICIAN: Loletha Grayer, MD  Chief Complaint:   Status post ORIF of left distal tibia fracture.  History of Present Illness: Tracy Dickerson is a 77 y.o. female with a history of hypertension, heart disease, and diabetes who is now 5 weeks status post an open reduction internal fixation of her left distal tibia fracture.  The patient was admitted last week with urosepsis, and has grown out Pseudomonas from her blood cultures and E. coli and probably Pseudomonas from her urine cultures.  The patient appears to be stabilizing well medically.  She has no complaints regarding her left ankle, other than some mild discomfort.  She denies any soreness directly related to the cast.  She also denies any numbness or paresthesias to her foot.  Past Medical History:  Diagnosis Date  . Cancer ([place])    Breast and lung  . Diabetes mellitus without complication ([place])   . GERD (gastroesophageal reflux disease)   . Heart disease   . Hypertension    Past Surgical History:  Procedure Laterality Date  . ABDOMINAL HYSTERECTOMY    . APPENDECTOMY    . BREAST SURGERY     breast cancer LEFT  . OPEN REDUCTION INTERNAL FIXATION (ORIF) TIBIA/FIBULA FRACTURE Left 02/03/2018   Procedure: OPEN REDUCTION INTERNAL FIXATION (ORIF) DISTAL TIBIA FRACTURE;  Surgeon: Corky Mull, MD;  Location: [facility] ORS;  Service: Orthopedics;  Laterality: Left;  ankle/lower leg  . TONSILLECTOMY     Social History   Socioeconomic History  . Marital status: Widowed    Spouse name: Not on file  . Number of children: Not on file  . Years of education: Not on file  . Highest education level: Not on file  Occupational History  . Not on file  Social Needs  . Financial resource strain: Not on file  . Food insecurity:    Worry: Not on file    Inability: Not on file  . Transportation needs:    Medical: Not on file    Non-medical:  Not on file  Tobacco Use  . Smoking status: Former Research scientist (life sciences)  . Smokeless tobacco: Never Used  Substance and Sexual Activity  . Alcohol use: Yes  . Drug use: No  . Sexual activity: Not Currently  Lifestyle  . Physical activity:    Days per week: Not on file    Minutes per session: Not on file  . Stress: Not on file  Relationships  . Social connections:    Talks on phone: Not on file    Gets together: Not on file    Attends religious service: Not on file    Active member of club or organization: Not on file    Attends meetings of clubs or organizations: Not on file    Relationship status: Not on file  Other Topics Concern  . Not on file  Social History Narrative  . Not on file   Family History  Problem Relation Age of Onset  . Cancer Mother    Allergies  Allergen Reactions  . Oxycodone   . Percocet [Oxycodone-Acetaminophen] Itching  . Penicillins Rash    Has patient had a PCN reaction causing immediate rash, facial/tongue/throat swelling, SOB or lightheadedness with hypotension: Yes Has patient had a PCN reaction causing severe rash involving mucus membranes or skin necrosis: Unknown Has patient had a PCN reaction that required hospitalization: Unknown Has patient had a PCN reaction occurring within the last 10 years: No If all of the above answers are "  NO", then may proceed with Cephalosporin use.   Prior to Admission medications   Medication Sig Start Date End Date Taking? Authorizing Provider  ADVAIR DISKUS 250-50 MCG/DOSE AEPB Inhale 1 puff into the lungs 2 (two) times daily. 12/14/16  Yes [provider]  albuterol (PROAIR HFA) 108 (90 Base) MCG/ACT inhaler Inhale 2 puffs into the lungs every 4 (four) hours as needed. 04/08/17  Yes [provider]  atorvastatin (LIPITOR) 80 MG tablet Take 80 mg by mouth daily.   Yes [provider]  benzonatate (TESSALON PERLES) 100 MG capsule Take 1 capsule (100 mg total) by mouth every 6 (six) hours as needed  for cough. 01/31/18 01/31/19 Yes Harvest Dark, MD  bisacodyl (DULCOLAX) 10 MG suppository Place 1 suppository (10 mg total) rectally daily as needed for moderate constipation. 02/16/18  Yes [name], MD  ferrous sulfate (SLOW FE) 160 (50 Fe) MG TBCR SR tablet Take 160 mg by mouth daily.  04/18/17  Yes [provider]  gabapentin (NEURONTIN) 300 MG capsule Take 1 capsule (300 mg total) by mouth at bedtime. 11/24/17  Yes Nance Pear, MD  metFORMIN (GLUCOPHAGE) 500 MG tablet Take 1 tablet (500 mg total) by mouth 2 (two) times daily. 02/06/15 03/13/18 Yes Paduchowski, Lennette Bihari, MD  metoprolol tartrate (LOPRESSOR) 25 MG tablet Take 0.5 tablets (12.5 mg total) by mouth 2 (two) times daily. 02/05/18  Yes Mody, Ulice Bold, MD  omeprazole (PRILOSEC) 40 MG capsule Take 40 mg by mouth daily. 01/15/17  Yes [provider]  polyethylene glycol (MIRALAX / GLYCOLAX) packet Take 17 g by mouth daily as needed for mild constipation. 02/16/18  Yes [name], MD  pramipexole (MIRAPEX) 0.25 MG tablet Take 0.25 mg by mouth at bedtime. 12/23/16  Yes [provider]  SPIRIVA HANDIHALER 18 MCG inhalation capsule Place 1 capsule into inhaler and inhale daily. 01/26/18  Yes [provider]  traMADol (ULTRAM) 50 MG tablet Take 1 tablet (50 mg total) by mouth every 6 (six) hours as needed for moderate pain or severe pain. 02/16/18  Yes [name], MD  warfarin (COUMADIN) 4 MG tablet Give 1 tablet (4MG ) by mouth every Monday, Wednesday, Thursday, Friday, Saturday and Sunday evening  Give  tablet (2MG ) by mouth every Tuesday evening   Yes [provider]   Dg Abd 1 View  Result Date: 03/15/2018 CLINICAL DATA:  Abdominal pain EXAM: ABDOMEN - 1 VIEW COMPARISON:  03/13/2018 FINDINGS: Scattered gas-filled nondilated small and large bowel. No evidence of obstruction. No radiopaque stones. Degenerative changes in the spine. Suggestion of infiltration or atelectasis in the right lung base.  IMPRESSION: 1. Nonobstructive bowel gas pattern. 2. Probable infiltration or atelectasis in the right lung base. Electronically Signed   By: Lucienne Capers M.D.   On: 03/15/2018 03:08   Dg Chest Port 1 View  Result Date: 03/15/2018 CLINICAL DATA:  77 year old female with history of weakness and nonproductive cough. EXAM: PORTABLE CHEST 1 VIEW COMPARISON:  Chest x-ray 03/13/2018. FINDINGS: There is a right upper extremity PICC with tip terminating in the superior aspect of the right atrium. Lung volumes are low. Elevation of the right hemidiaphragm. Diffuse interstitial prominence and peribronchial cuffing, increased slightly compared to the prior examination. Patchy ill-defined opacities in the right lung base, concerning for airspace consolidation. Small right pleural effusion. No evidence of pulmonary edema. Heart size is normal. The patient is rotated to the right on today's exam, resulting in distortion of the mediastinal contours and reduced diagnostic sensitivity and specificity for  mediastinal pathology. Surgical clips throughout the left axillary region, suggesting prior lymph node dissection. IMPRESSION: 1. Worsening diffuse peribronchial cuffing and interstitial prominence, concerning for an acute bronchitis. This is superimposed upon a background of what appears to be chronic interstitial lung disease. 2. Small right pleural effusion. Electronically Signed   By: Vinnie Langton M.D.   On: 03/15/2018 10:48    Positive ROS: All other systems have been reviewed and were otherwise negative with the exception of those mentioned in the HPI and as above.  Physical Exam: General:  Alert, no acute distress Psychiatric:  Patient is competent for consent with normal mood and affect   Cardiovascular:  No pedal edema Respiratory:  No wheezing, non-labored breathing GI:  Abdomen is soft and non-tender Skin:  No lesions in the area of chief complaint Neurologic:  Sensation intact distally Lymphatic:   No axillary or cervical lymphadenopathy  Orthopedic Exam:  Orthopedic examination is limited to the left lower extremity and foot.  Her cast appears to be in good condition and without breakdown, other than some softening in the area of the plantar forefoot region.  Skin inspection at the proximal distal margins of the cast is unremarkable.  There is no significant swelling or erythema in these areas.  The cast appears to be fitting well if not being slightly loose.  She is neurovascularly intact to her left foot with good capillary refill to her toes.  She is able to dorsiflex and plantarflex her toes, and has good sensation to light touch to all digits.  X-rays:  No new x-rays have been obtained.  Assessment: Status post ORIF of left distal tibia fracture.  Plan: The treatment options are reviewed with the patient.  At this point, she is tolerating the cast quite well.  She is 5 weeks out from her surgery.  The plan is for her to return to the office in 2 weeks, at which time the cast will be removed and repeat x-rays obtained.  Meanwhile, the patient is to remain nonweightbearing on the left leg.  She is to keep her leg elevated.  She may continue to receive appropriate antibiotics and other medical care as necessary for her medical issues.  Thank you for asking me to participate in the care of this most pleasant yet unfortunate woman.  I do not see any need for further orthopedic intervention at this time, but if you do need me prior to her discharge, please contact me.   Pascal Lux, MD  Beeper #:  [phone]  03/16/2018 5:51 PM

## 2018-03-16 NOTE — Progress Notes (Signed)
Pharmacy Antibiotic Note  Tracy Dickerson is a 77 y.o. female admitted on 03/13/2018 with sepsis and UTI.  Pharmacy has been consulted for cefepime and ciprofloxacin dosing.  Plan: Cefepime 1g q 12h. Ciprofloxacin 400mg  q12h.     Height: 5\' 6"  (167.6 cm) Weight: 246 lb (111.6 kg) IBW/kg (Calculated) : 59.3  Temp (24hrs), Avg:96.9 F (36.1 C), Min:96.3 F (35.7 C), Max:97.9 F (36.6 C)  Recent Labs  Lab 03/13/18 1427 03/13/18 1446 03/13/18 2209 03/13/18 2210 03/14/18 0334 03/15/18 0539 03/16/18 0546  WBC  --  22.6*  --  27.1* 22.5* 19.6* 9.4  CREATININE  --  3.11*  --  2.73* 2.40* 1.74* 1.32*  LATICACIDVEN 1.9  --  1.7  --  1.0  --   --     Estimated Creatinine Clearance: 47.3 mL/min (A) (by C-G formula based on SCr of 1.32 mg/dL (H)).    Allergies  Allergen Reactions  . Oxycodone   . Percocet [Oxycodone-Acetaminophen] Itching  . Penicillins Rash    Has patient had a PCN reaction causing immediate rash, facial/tongue/throat swelling, SOB or lightheadedness with hypotension: Yes Has patient had a PCN reaction causing severe rash involving mucus membranes or skin necrosis: Unknown Has patient had a PCN reaction that required hospitalization: Unknown Has patient had a PCN reaction occurring within the last 10 years: No If all of the above answers are "NO", then may proceed with Cephalosporin use.    Antimicrobials this admission: 6/21 vancomycin >> 6/21 6/21 cefepime >>  6/22 ciprofloxacin >>  Dose adjustments this admission: 6/21 cefepime 2g q24h >> 6/23 cefepime 2g q12h >> 6/24 cefepime 1g q12h 6/22 ciprofloxacin 200mg  q24h >> 6/24 ciprofloxacin 400mg  q12h  Microbiology results: 6/21 BCx: pseudomonas 6/21 UCx: >100,000 colonies gram negative rods, 80,000 e.coli 6/21 MRSA PCR: negative  Thank you for allowing pharmacy to be a part of this patient's care.  Tyson Babinski 03/16/2018 4:03 PM

## 2018-03-16 NOTE — Plan of Care (Signed)
Pt. A&O x4, following commands, VSS O/N- levo gtt off around 2230 (see MAR for specifics).  Pt. On RA. UOP adequate at 50-100 mL/h No c/o pain, slept intermittently throughout evening. Pt. Anxious to drink & eat after SLP eval today. Hgb this AM dropped to 6.7, Ms. Patria Mane, NP aware- will order blood transfusion. No other care concerns at this time, report given to [facility], [place]

## 2018-03-17 ENCOUNTER — Inpatient Hospital Stay: Payer: Medicare Other

## 2018-03-17 LAB — BPAM RBC
Blood Product Expiration Date: 201906252359
ISSUE DATE / TIME: 201906240826
Unit Type and Rh: 5100

## 2018-03-17 LAB — URINE CULTURE: Culture: >=100000 — AB

## 2018-03-17 LAB — CBC WITH DIFFERENTIAL/PLATELET
Basophils Absolute: 0 10*3/uL (ref 0–0.1)
Basophils Relative: 0 %
Eosinophils Absolute: 0 10*3/uL (ref 0–0.7)
Eosinophils Relative: 0 %
HCT: 26.3 % — ABNORMAL LOW (ref 35.0–47.0)
Hemoglobin: 8.3 g/dL — ABNORMAL LOW (ref 12.0–16.0)
Lymphocytes Relative: 6 %
Lymphs Abs: 0.7 10*3/uL — ABNORMAL LOW (ref 1.0–3.6)
MCH: 25.5 pg — ABNORMAL LOW (ref 26.0–34.0)
MCHC: 31.6 g/dL — ABNORMAL LOW (ref 32.0–36.0)
MCV: 80.8 fL (ref 80.0–100.0)
Monocytes Absolute: 0.3 10*3/uL (ref 0.2–0.9)
Monocytes Relative: 3 %
Neutro Abs: 10.4 10*3/uL — ABNORMAL HIGH (ref 1.4–6.5)
Neutrophils Relative %: 91 %
Platelets: 296 10*3/uL (ref 150–440)
RBC: 3.26 MIL/uL — ABNORMAL LOW (ref 3.80–5.20)
RDW: 21.4 % — ABNORMAL HIGH (ref 11.5–14.5)
WBC: 11.5 10*3/uL — ABNORMAL HIGH (ref 3.6–11.0)

## 2018-03-17 LAB — GLUCOSE, CAPILLARY
Glucose-Capillary: 135 mg/dL — ABNORMAL HIGH (ref 70–99)
Glucose-Capillary: 148 mg/dL — ABNORMAL HIGH (ref 70–99)
Glucose-Capillary: 169 mg/dL — ABNORMAL HIGH (ref 70–99)
Glucose-Capillary: 169 mg/dL — ABNORMAL HIGH (ref 70–99)
Glucose-Capillary: 176 mg/dL — ABNORMAL HIGH (ref 70–99)
Glucose-Capillary: 199 mg/dL — ABNORMAL HIGH (ref 70–99)

## 2018-03-17 LAB — TYPE AND SCREEN
ABO/RH(D): O POS
Antibody Screen: NEGATIVE
Unit division: 0

## 2018-03-17 LAB — PROTIME-INR
INR: 2.14
Prothrombin Time: 23.7 seconds — ABNORMAL HIGH (ref 11.4–15.2)

## 2018-03-17 LAB — OCCULT BLOOD X 1 CARD TO LAB, STOOL: Fecal Occult Bld: NEGATIVE

## 2018-03-17 MED ORDER — HYDROCORTISONE NA SUCCINATE PF 100 MG IJ SOLR
25.0000 mg | Freq: Two times a day (BID) | INTRAMUSCULAR | Status: DC
  Administered 2018-03-17 – 2018-03-18 (×2): 25 mg via INTRAVENOUS
  Filled 2018-03-17 (×2): qty 0.5

## 2018-03-17 MED ORDER — WARFARIN - PHARMACIST DOSING INPATIENT
Freq: Every day | Status: DC
  Administered 2018-03-17 – 2018-03-19 (×3)

## 2018-03-17 MED ORDER — WARFARIN SODIUM 3 MG PO TABS: 3.0000 mg | ORAL_TABLET | Freq: Every day | ORAL | Status: DC

## 2018-03-17 MED ORDER — FUROSEMIDE 10 MG/ML IJ SOLN
40.0000 mg | Freq: Once | INTRAMUSCULAR | Status: AC
  Administered 2018-03-17: 40 mg via INTRAVENOUS
  Filled 2018-03-17: qty 4

## 2018-03-17 MED ORDER — FAMOTIDINE 20 MG PO TABS
20.0000 mg | ORAL_TABLET | Freq: Two times a day (BID) | ORAL | Status: DC
  Administered 2018-03-17 – 2018-03-20 (×7): 20 mg via ORAL
  Filled 2018-03-17 (×7): qty 1

## 2018-03-17 MED ORDER — WARFARIN SODIUM 2 MG PO TABS: 2.0000 mg | ORAL_TABLET | ORAL | Status: DC

## 2018-03-17 MED ORDER — WARFARIN SODIUM 4 MG PO TABS: 4.0000 mg | ORAL_TABLET | ORAL | Status: DC

## 2018-03-17 MED ORDER — WARFARIN SODIUM 4 MG PO TABS
4.0000 mg | ORAL_TABLET | Freq: Once | ORAL | Status: DC
  Filled 2018-03-17: qty 1

## 2018-03-17 MED ORDER — FUROSEMIDE 10 MG/ML IJ SOLN
40.0000 mg | Freq: Once | INTRAMUSCULAR | Status: AC
  Administered 2018-03-17: 08:00:00 40 mg via INTRAVENOUS
  Filled 2018-03-17: qty 4

## 2018-03-17 MED ORDER — WARFARIN SODIUM 3 MG PO TABS
3.0000 mg | ORAL_TABLET | Freq: Every day | ORAL | Status: DC
  Administered 2018-03-17: 3 mg via ORAL
  Filled 2018-03-17: qty 1

## 2018-03-17 NOTE — Progress Notes (Addendum)
ANTICOAGULATION CONSULT NOTE - Initial Consult  Pharmacy Consult for warfarin Indication: AF  Allergies  Allergen Reactions  . Oxycodone   . Percocet [Oxycodone-Acetaminophen] Itching  . Penicillins Rash    Has patient had a PCN reaction causing immediate rash, facial/tongue/throat swelling, SOB or lightheadedness with hypotension: Yes Has patient had a PCN reaction causing severe rash involving mucus membranes or skin necrosis: Unknown Has patient had a PCN reaction that required hospitalization: Unknown Has patient had a PCN reaction occurring within the last 10 years: No If all of the above answers are "NO", then may proceed with Cephalosporin use.    Patient Measurements: Height: 5\' 6"  (167.6 cm) Weight: 246 lb (111.6 kg) IBW/kg (Calculated) : 59.3 Heparin Dosing Weight:   Vital Signs: Temp: 97 F (36.1 C) (06/25 0349) Temp Source: Axillary (06/25 0349) BP: 139/71 (06/25 0349) Pulse Rate: 79 (06/25 0349)  Labs: Recent Labs    03/14/18 0941  03/15/18 0539 03/16/18 0546 03/16/18 1256 03/17/18 0341  HGB  --    < > 7.8* 6.7* 8.3* 8.3*  HCT  --    < > 25.2* 21.2* 25.5* 26.3*  PLT  --   --  392 252  --  296  CREATININE  --   --  1.74* 1.32*  --   --   TROPONINI 0.03*  --   --   --   --   --    < > = values in this interval not displayed.    Estimated Creatinine Clearance: 47.3 mL/min (A) (by C-G formula based on SCr of 1.32 mg/dL (H)).   Medical History: Past Medical History:  Diagnosis Date  . Cancer ([place])    Breast and lung  . Diabetes mellitus without complication ([place])   . GERD (gastroesophageal reflux disease)   . Heart disease   . Hypertension     Medications:  Infusions:  . ceFEPime (MAXIPIME) IV Stopped (03/16/18 2341)  . ciprofloxacin Stopped (03/16/18 2102)    Assessment: 85 yof here with shock from pseudomonas on ciprofloxacin and cefepime from urinary source. Patient takes warfarin PTA but on admission INR was greater than 10 so she  received 10 mg of IV vitamin K x 1 dose on 03/13/18. Outpatient her VKA dose was recently reduced from 28 mg/wk to 26 mg/wk due to elevated INR. Her albumin has notably decreased this month which is likely contributing to elevated INR, along with sepsis. Pharmacy consulted to dose warfarin for AF.   Goal of Therapy:  INR 2-3 Monitor platelets by anticoagulation protocol: Yes   Plan:  1. Check INR this morning 2. INR this AM is therapeutic. Reduce to warfarin 3 mg po daily due to significantly elevated INR on admission, lower albumin, and drug-drug interaction with ciprofloxacin (approximately 20% dose reduction) 3. Monitor INR daily while on antibiotics   Laural Benes, Pharm.D., BCPS Clinical Pharmacist 03/17/2018,7:37 AM

## 2018-03-17 NOTE — Evaluation (Signed)
Physical Therapy Evaluation Patient Details Name: Tracy Dickerson MRN: [id number] DOB: 10/04/1940 Today's Date: 03/17/2018   History of Present Illness  77 year old female s/p ORIF of L distal tibia fracture ~5 weeks ago.   She now comes in from rehab facility with sepsis due to UTI.  PMH includes htn, heart disease, GERD, CA and DM.  Clinical Impression  Pt reports that she has been doing a lot of exercises at rehab, but that she has been able to do little more than highly assisted standing and no hopping, walking, etc.  She was willing to do some light exercises today but before trying to get up to EOB she had a bowel movement and needed a clean up.  Given weakness during today's session as well as recent mobility limitations at rehab she is unable to go home and will need continued PT at rehab.    Follow Up Recommendations SNF    Equipment Recommendations       Recommendations for Other Services       Precautions / Restrictions Precautions Precautions: Fall Required Braces or Orthoses: (in cast) Restrictions LLE Weight Bearing: Non weight bearing      Mobility  Bed Mobility               General bed mobility comments: started to do some mobility and found pt to have had a BM and needing clean up  Transfers                    Ambulation/Gait                Stairs            Wheelchair Mobility    Modified Rankin (Stroke Patients Only)       Balance                                             Pertinent Vitals/Pain Pain Assessment: (reports only minimal pain in L leg)    Home Living Family/patient expects to be discharged to:: Skilled nursing facility Living Arrangements: Other relatives                    Prior Function Level of Independence: Independent with assistive device(s)         Comments: Pt reports that she has not been able to do more than very minimal standing at rehab, no ambulation of any  kind     Hand Dominance        Extremity/Trunk Assessment   Upper Extremity Assessment Upper Extremity Assessment: Generalized weakness    Lower Extremity Assessment Lower Extremity Assessment: Generalized weakness(Pt with very little LE strength in b/l LEs, minimal AROM)       Communication   Communication: No difficulties  Cognition Arousal/Alertness: Awake/alert Behavior During Therapy: WFL for tasks assessed/performed Overall Cognitive Status: Within Functional Limits for tasks assessed                                        General Comments      Exercises General Exercises - Lower Extremity Ankle Circles/Pumps: AROM;Both(L in cast) Heel Slides: AAROM;10 reps;Right Hip ABduction/ADduction: AAROM;10 reps;Both Straight Leg Raises: (unable to lift against gravity)   Assessment/Plan    PT  Assessment Patient needs continued PT services  PT Problem List Decreased range of motion;Decreased strength;Decreased balance;Decreased activity tolerance;Decreased mobility;Decreased coordination;Decreased safety awareness;Decreased knowledge of use of DME;Pain       PT Treatment Interventions DME instruction;Gait training;Stair training;Functional mobility training;Therapeutic activities;Therapeutic exercise;Balance training;Neuromuscular re-education;Patient/family education    PT Goals (Current goals can be found in the Care Plan section)  Acute Rehab PT Goals Patient Stated Goal: go back to rehab PT Goal Formulation: With patient Time For Goal Achievement: 03/31/18 Potential to Achieve Goals: Fair    Frequency Min 2X/week   Barriers to discharge        Co-evaluation               AM-PAC PT "6 Clicks" Daily Activity  Outcome Measure Difficulty turning over in bed (including adjusting bedclothes, sheets and blankets)?: Unable Difficulty moving from lying on back to sitting on the side of the bed? : Unable Difficulty sitting down on and  standing up from a chair with arms (e.g., wheelchair, bedside commode, etc,.)?: Unable Help needed moving to and from a bed to chair (including a wheelchair)?: Total Help needed walking in hospital room?: Total Help needed climbing 3-5 steps with a railing? : Total 6 Click Score: 6    End of Session   Activity Tolerance: Patient limited by fatigue Patient left: with bed alarm set;with call bell/phone within reach;with family/visitor present Nurse Communication: (need BM clean up) PT Visit Diagnosis: Muscle weakness (generalized) (M62.81);Difficulty in walking, not elsewhere classified (R26.2)    Time: 1432-1450 PT Time Calculation (min) (ACUTE ONLY): 18 min   Charges:   PT Evaluation $PT Eval Low Complexity: 1 Low     PT G CodesKreg Shropshire, DPT 03/17/2018, 3:55 PM

## 2018-03-17 NOTE — Plan of Care (Signed)
  Problem: Education: Goal: Knowledge of General Education information will improve Outcome: Progressing   Problem: Health Behavior/Discharge Planning: Goal: Ability to manage health-related needs will improve Outcome: Progressing   Problem: Clinical Measurements: Goal: Ability to maintain clinical measurements within normal limits will improve Outcome: Progressing Goal: Will remain free from infection Outcome: Progressing Goal: Diagnostic test results will improve Outcome: Progressing Goal: Respiratory complications will improve Outcome: Progressing Goal: Cardiovascular complication will be avoided Outcome: Progressing   Problem: Activity: Goal: Risk for activity intolerance will decrease Outcome: Progressing   Problem: Nutrition: Goal: Adequate nutrition will be maintained Outcome: Progressing   Problem: Coping: Goal: Level of anxiety will decrease Outcome: Progressing   Problem: Elimination: Goal: Will not experience complications related to bowel motility Outcome: Progressing Goal: Will not experience complications related to urinary retention Outcome: Progressing   Problem: Pain Managment: Goal: General experience of comfort will improve Outcome: Progressing   Problem: Safety: Goal: Ability to remain free from injury will improve Outcome: Progressing   Problem: Skin Integrity: Goal: Risk for impaired skin integrity will decrease Outcome: Progressing   Problem: Fluid Volume: Goal: Hemodynamic stability will improve Outcome: Progressing   Problem: Clinical Measurements: Goal: Diagnostic test results will improve Outcome: Progressing Goal: Signs and symptoms of infection will decrease Outcome: Progressing

## 2018-03-17 NOTE — Care Management Important Message (Signed)
Important Message  Patient Details  Name: Tracy Dickerson MRN: [id number] Date of Birth: October 13, 1940   Medicare Important Message Given:  Yes    Juliann Pulse A Bernese Doffing 03/17/2018, 10:28 AM

## 2018-03-17 NOTE — Progress Notes (Signed)
Patient ID: Tracy Dickerson, female   DOB: January 14, 1941, 77 y.o.   MRN: [id number]  [employer] PROGRESS NOTE  Tracy Dickerson YQM:[id number] DOB: [birth year] DOA: 03/13/2018 PCP: Boykin Nearing, MD  HPI/Subjective: Patient complaining of some cough and some shortness of breath.  Some epigastric pain after eating something hot this morning.  Brother concerned that she cannot tell when she has to use the bathroom.  Objective: Vitals:   03/17/18 0809 03/17/18 1323  BP:  138/71  Pulse:  82  Resp:  18  Temp:  97.6 F (36.4 C)  SpO2: 97% 99%    Filed Weights   03/13/18 1423  Weight: 111.6 kg (246 lb)    ROS: Review of Systems  Constitutional: Negative for chills and fever.  Eyes: Negative for blurred vision.  Respiratory: Negative for cough and shortness of breath.   Cardiovascular: Negative for chest pain.  Gastrointestinal: Negative for abdominal pain, constipation, diarrhea, nausea and vomiting.  Genitourinary: Negative for dysuria.  Musculoskeletal: Negative for joint pain.  Neurological: Negative for dizziness and headaches.   Exam: Physical Exam  HENT:  Nose: No mucosal edema.  Mouth/Throat: No oropharyngeal exudate or posterior oropharyngeal edema.  Eyes: Pupils are equal, round, and reactive to light. Conjunctivae, EOM and lids are normal.  Neck: No JVD present. Carotid bruit is not present. No edema present. No thyroid mass and no thyromegaly present.  Cardiovascular: S1 normal and S2 normal. Exam reveals no gallop.  No murmur heard. Pulses:      Dorsalis pedis pulses are 2+ on the right side, and 2+ on the left side.  Respiratory: No respiratory distress. She has decreased breath sounds in the right middle field, the right lower field and the left lower field. She has no wheezes. She has no rhonchi. She has rales in the right middle field, the right lower field and the left lower field.  GI: Soft. Bowel sounds are normal. There is no tenderness.   Musculoskeletal:       Right ankle: She exhibits swelling.       Left ankle: She exhibits swelling.  Cast on left foot.  Lymphadenopathy:    She has no cervical adenopathy.  Neurological: She is alert. No cranial nerve deficit.  Skin: Skin is warm. No rash noted. Nails show no clubbing.  Psychiatric: She has a normal mood and affect.      Data Reviewed: Basic Metabolic Panel: Recent Labs  Lab 03/13/18 1446 03/13/18 2210 03/14/18 0334 03/15/18 0539 03/16/18 0546  NA 137 139 140 140 141  K 5.7* 4.9 4.4 4.3 4.2  CL 107 110 112* 117* 120*  CO2 18* 19* 19* 17* 16*  GLUCOSE 140* 152* 154* 118* 144*  BUN 92* 91* 87* 75* 61*  CREATININE 3.11* 2.73* 2.40* 1.74* 1.32*  CALCIUM 8.0* 7.4* 7.4* 7.3* 7.4*  MG  --  2.7*  --   --   --   PHOS  --  4.3  --  4.4 4.0   Liver Function Tests: Recent Labs  Lab 03/13/18 1446 03/13/18 2210 03/15/18 0539 03/15/18 1002 03/16/18 0546  AST 70* 69*  --  41  --   ALT 28 28  --  17  --   ALKPHOS 137* 153*  --  106  --   BILITOT 0.8 1.0  --  0.6  --   PROT 7.2 7.4  --  5.7*  --   ALBUMIN 1.9* 2.0* 1.6* 1.2* 1.4*   CBC: Recent Labs  Lab  03/13/18 1446 03/13/18 2210 03/14/18 0334 03/15/18 0539 03/16/18 0546 03/16/18 1256 03/17/18 0341  WBC 22.6* 27.1* 22.5* 19.6* 9.4  --  11.5*  NEUTROABS 20.7*  --   --  17.3* 8.6*  --  10.4*  HGB 8.6* 8.5* 7.5* 7.8* 6.7* 8.3* 8.3*  HCT 25.9* 26.8* 23.7* 25.2* 21.2* 25.5* 26.3*  MCV 90.1 82.8 82.3 82.0 81.2  --  80.8  PLT 434 396 331 392 252  --  296   Cardiac Enzymes: Recent Labs  Lab 03/13/18 2210 03/14/18 0334 03/14/18 0941  TROPONINI 0.06* 0.04* 0.03*    CBG: Recent Labs  Lab 03/16/18 2033 03/17/18 0000 03/17/18 0346 03/17/18 0746 03/17/18 1209  GLUCAP 214* 169* 148* 135* 176*    Recent Results (from the past 240 hour(s))  Blood Culture (routine x 2)     Status: Abnormal   Collection Time: 03/13/18  2:27 PM  Result Value Ref Range Status   Specimen Description   Final     BLOOD R HAND Performed at [facility], [address], [place], [place] [address]    Special Requests   Final    BOTTLES DRAWN AEROBIC AND ANAEROBIC Blood Culture adequate volume Performed at [facility], [address] [place], [place] [address]    Culture  Setup Time   Final    GRAM NEGATIVE RODS AEROBIC BOTTLE ONLY CRITICAL RESULT CALLED TO, READ BACK BY AND VERIFIED WITH: LISA KLUTTZ ON 03/14/18 AT 0850 QSD Performed at [facility], [address] [place], [place] [address]    Culture (A)  Final    PSEUDOMONAS AERUGINOSA SUSCEPTIBILITIES PERFORMED ON PREVIOUS CULTURE WITHIN THE LAST 5 DAYS. Performed at [facility], [address] [place], [place] [address]    Report Status 03/16/2018 FINAL  Final  Blood Culture (routine x 2)     Status: Abnormal   Collection Time: 03/13/18  2:27 PM  Result Value Ref Range Status   Specimen Description   Final    BLOOD L HAND Performed at [facility], [address], [place], [place] [address]    Special Requests   Final    BOTTLES DRAWN AEROBIC AND ANAEROBIC Blood Culture adequate volume Performed at [facility], [address] [place], [place] [address]    Culture  Setup Time   Final    Organism ID to follow GRAM NEGATIVE RODS AEROBIC BOTTLE ONLY CRITICAL RESULT CALLED TO, READ BACK BY AND VERIFIED WITH: LISA KLUTTZ ON 03/14/18 AT 0850 QSD Performed at [facility], [address] [place],  [address]    Culture PSEUDOMONAS AERUGINOSA (A)  Final   Report Status 03/16/2018 FINAL  Final   Organism ID, Bacteria PSEUDOMONAS AERUGINOSA  Final      Susceptibility   Pseudomonas aeruginosa - MIC*    CEFTAZIDIME 4 SENSITIVE Sensitive     CIPROFLOXACIN <=0.25 SENSITIVE Sensitive     GENTAMICIN <=1 SENSITIVE Sensitive     IMIPENEM 1 SENSITIVE Sensitive     PIP/TAZO 8 SENSITIVE Sensitive     CEFEPIME 2 SENSITIVE Sensitive     * PSEUDOMONAS AERUGINOSA  Blood  Culture ID Panel (Reflexed)     Status: Abnormal   Collection Time: 03/13/18  2:27 PM  Result Value Ref Range Status   Enterococcus species NOT DETECTED NOT DETECTED Final   Listeria monocytogenes NOT DETECTED NOT DETECTED Final   Staphylococcus species NOT DETECTED NOT DETECTED Final   Staphylococcus aureus NOT DETECTED NOT DETECTED Final  Streptococcus species NOT DETECTED NOT DETECTED Final   Streptococcus agalactiae NOT DETECTED NOT DETECTED Final   Streptococcus pneumoniae NOT DETECTED NOT DETECTED Final   Streptococcus pyogenes NOT DETECTED NOT DETECTED Final   Acinetobacter baumannii NOT DETECTED NOT DETECTED Final   Enterobacteriaceae species NOT DETECTED NOT DETECTED Final   Enterobacter cloacae complex NOT DETECTED NOT DETECTED Final   Escherichia coli NOT DETECTED NOT DETECTED Final   Klebsiella oxytoca NOT DETECTED NOT DETECTED Final   Klebsiella pneumoniae NOT DETECTED NOT DETECTED Final   Proteus species NOT DETECTED NOT DETECTED Final   Serratia marcescens NOT DETECTED NOT DETECTED Final   Carbapenem resistance NOT DETECTED NOT DETECTED Final   Haemophilus influenzae NOT DETECTED NOT DETECTED Final   Neisseria meningitidis NOT DETECTED NOT DETECTED Final   Pseudomonas aeruginosa DETECTED (A) NOT DETECTED Final    Comment: CRITICAL RESULT CALLED TO, READ BACK BY AND VERIFIED WITH: LISA KLUTTZ ON 03/14/18 AT 0850 QSD    Candida albicans NOT DETECTED NOT DETECTED Final   Candida glabrata NOT DETECTED NOT DETECTED Final   Candida krusei NOT DETECTED NOT DETECTED Final   Candida parapsilosis NOT DETECTED NOT DETECTED Final   Candida tropicalis NOT DETECTED NOT DETECTED Final    Comment: Performed at [facility], [address] [place], [place] [address]  Urine culture     Status: Abnormal   Collection Time: 03/13/18  2:40 PM  Result Value Ref Range Status   Specimen Description   Final    URINE, RANDOM Performed at [facility], [address], [place], [place] [address]    Special Requests   Final    NONE Performed at [facility], [address], [place], [place] [address]    Culture (A)  Final    >=100,000 COLONIES/mL PSEUDOMONAS AERUGINOSA 80,000 COLONIES/mL ESCHERICHIA COLI    Report Status 03/17/2018 FINAL  Final   Organism ID, Bacteria PSEUDOMONAS AERUGINOSA (A)  Final   Organism ID, Bacteria ESCHERICHIA COLI (A)  Final      Susceptibility   Escherichia coli - MIC*    AMPICILLIN 8 SENSITIVE Sensitive     CEFAZOLIN <=4 SENSITIVE Sensitive     CEFTRIAXONE <=1 SENSITIVE Sensitive     CIPROFLOXACIN <=0.25 SENSITIVE Sensitive     GENTAMICIN <=1 SENSITIVE Sensitive     IMIPENEM <=0.25 SENSITIVE Sensitive     NITROFURANTOIN <=16 SENSITIVE Sensitive     TRIMETH/SULFA <=20 SENSITIVE Sensitive     AMPICILLIN/SULBACTAM 8 SENSITIVE Sensitive     PIP/TAZO <=4 SENSITIVE Sensitive     Extended ESBL NEGATIVE Sensitive     * 80,000 COLONIES/mL ESCHERICHIA COLI   Pseudomonas aeruginosa - MIC*    CEFTAZIDIME 4 SENSITIVE Sensitive     CIPROFLOXACIN <=0.25 SENSITIVE Sensitive     GENTAMICIN 2 SENSITIVE Sensitive     IMIPENEM 1 SENSITIVE Sensitive     PIP/TAZO 8 SENSITIVE Sensitive     CEFEPIME 2 SENSITIVE Sensitive     * >=100,000 COLONIES/mL PSEUDOMONAS AERUGINOSA  MRSA PCR Screening     Status: None   Collection Time: 03/13/18  9:27 PM  Result Value Ref Range Status   MRSA by PCR NEGATIVE NEGATIVE Final    Comment:        The GeneXpert MRSA Assay (FDA approved for NASAL specimens only), is one component of a comprehensive MRSA colonization surveillance program. It is not intended to diagnose MRSA infection nor to guide or monitor treatment for MRSA infections. Performed at [facility],  [place], [place] [address]   CULTURE, BLOOD (ROUTINE X 2) w Reflex to ID Panel     Status: None (Preliminary result)   Collection Time: 03/15/18  9:32 PM  Result Value Ref Range Status   Specimen  Description BLOOD RIGHT HAND  Final   Special Requests   Final    BOTTLES DRAWN AEROBIC AND ANAEROBIC Blood Culture adequate volume   Culture   Final    NO GROWTH 2 DAYS Performed at [facility], [address] [place], [place] [address]    Report Status PENDING  Incomplete  CULTURE, BLOOD (ROUTINE X 2) w Reflex to ID Panel     Status: None (Preliminary result)   Collection Time: 03/15/18  9:41 PM  Result Value Ref Range Status   Specimen Description BLOOD BLOOD LEFT ARM  Final   Special Requests   Final    BOTTLES DRAWN AEROBIC AND ANAEROBIC Blood Culture adequate volume   Culture   Final    NO GROWTH 2 DAYS Performed at [facility], [address] [place], [place] [address]    Report Status PENDING  Incomplete     Studies: Dg Chest Port 1 View  Result Date: 03/17/2018 CLINICAL DATA:  Breast cancer. Lung cancer. Productive cough. Fever. EXAM: PORTABLE CHEST 1 VIEW COMPARISON:  03/15/2018. FINDINGS: PICC line noted with tip at cavoatrial junction. Cardiomegaly with normal pulmonary vascularity. Bilateral pulmonary interstitial prominence and bilateral pleural effusions, right side greater than left. Findings consistent CHF. Surgical sutures are noted over the right chest. No acute bony abnormality. IMPRESSION: 1.  Right PICC line noted with tip at cavoatrial junction. 2. Cardiomegaly with bilateral interstitial prominence and right-sided pleural effusion consistent with CHF. Electronically Signed   By: [place]   On: 03/17/2018 10:53    Scheduled Meds: . atorvastatin  80 mg Oral Daily  . chlorhexidine  15 mL Mouth Rinse BID  . famotidine  20 mg Oral BID  . ferrous sulfate  325 mg Oral Daily  . folic acid  1 mg Oral Daily  . gabapentin  300 mg Oral QHS  . hydrocortisone sod succinate (SOLU-CORTEF) inj  25 mg Intravenous Q12H  . insulin aspart  0-9 Units Subcutaneous Q4H  . ipratropium-albuterol  3 mL Nebulization Q6H  . mouth rinse  15 mL Mouth  Rinse q12n4p  . pantoprazole  40 mg Oral Daily  . pramipexole  0.25 mg Oral QHS  . warfarin  3 mg Oral q1800  . Warfarin - Pharmacist Dosing Inpatient   Does not apply q1800   Continuous Infusions: . ciprofloxacin Stopped (03/17/18 5465)    Assessment/Plan:  1. Septic shock with Pseudomonas.  Sources acute cystitis with hematuria.  Patient will go home with Foley catheter.   currently off pressors.  Downgrade antibiotics to single coverage with Cipro.  Taper off stress dose steroids. 2. Fluid overload.  Give a dose of IV Lasix this morning and give another dose this afternoon and reevaluate tomorrow. 3. Acute kidney injury on chronic kidney disease stage III.  Secondary to septic shock.  IV fluid hydration.  Creatinine has improved to 1.32. 4. Hyperlipidemia unspecified on atorvastatin 5. diabetic neuropathy on gabapentin 6. GERD on Protonix 7. Restless leg syndrome on Mirapex 8. Obesity.  Weight loss needed. 9. Atrial fibrillation.    INR still elevated at 2.14.  Cipro and Coumadin can potentially do this.  Careful with Coumadin dosing. 10. Anemia unspecified.  Patient transfused  1 unit of  packed red blood cells on a hemoglobin of 6.7.  Repeat hemoglobin 8.3.  Code Status:     Code Status Orders  (From admission, onward)        Start     Ordered   03/13/18 2021  Full code  Continuous     03/13/18 2020    Code Status History    Date Active Date Inactive Code Status Order ID Comments User Context   02/13/2018 2026 02/16/2018 1637 Full Code [id number]  Gorden Harms, MD Inpatient   02/02/2018 2159 02/03/2018 2049 Full Code [id number]  Corky Mull, MD Inpatient   10/29/2017 1816 10/31/2017 1816 DNR [id number]  Hillary Bow, MD ED   01/26/2017 2250 01/27/2017 1427 Full Code [id number]  Henreitta Leber, MD ED     Family Communication:  spoke with brothers on the phone Disposition Plan: Potentially back to rehab in a few days  Consultants:  Critical care  specialist  Antibiotics:  Cefepime  Cipro  Time spent: 27 minutes  H. Rivera Colon

## 2018-03-17 NOTE — Progress Notes (Signed)
  Speech Language Pathology Treatment: Dysphagia  Patient Details Name: Tracy Dickerson MRN: [id number] DOB: 1941-08-04 Today's Date: 03/17/2018 Time: 3295-1884 SLP Time Calculation (min) (ACUTE ONLY): 26 min  Assessment / Plan / Recommendation Clinical Impression  Pt was seated at 45 degree angle with meal at bedside. Repositioned Pt for meal. Pt tolerated bites of chopped meat without difficulty. Able to clear oral cavity adequately. Also noted straws in cups. Reminded Pt of no straws. Pt tolerated sips of thin by cup without difficulty. Rec. continue with current Dys 3 diet with thin liquids by cup only. Vocal quality remained clear, laryngeal elevation appeared adequate.    HPI   Per admitting History and Physical:  Tracy Dickerson  is a 77 y.o. female with a known history of breast cancer, diabetes mellitus type 2, GERD, hypertension presented to from the [place] [facility] facility for lethargy and fever.         SLP Plan  Continue with current plan of care       Recommendations  Diet recommendations: Dysphagia 3 (mechanical soft);Thin liquid Liquids provided via: No straw Supervision: Intermittent supervision to cue for compensatory strategies Postural Changes and/or Swallow Maneuvers: Seated upright 90 degrees;Upright 30-60 min after meal                Plan: Continue with current plan of care       GO                Lucila Maine 03/17/2018, 2:32 PM

## 2018-03-17 NOTE — Progress Notes (Deleted)
Pt is tolerating current diet. Pt reports the food is fine and doesn't mind that it is pureed. Rec. continue with this diet for now. Possible trials of Dys 2 for next visit if Pt is willing.

## 2018-03-17 NOTE — Progress Notes (Signed)
PT Cancellation Note  Patient Details Name: Tracy Dickerson MRN: [id number] DOB: 29-Nov-1940   Cancelled Treatment:    Reason Eval/Treat Not Completed: Patient at procedure or test/unavailable Attempted to see pt X2 this AM, first time she was getting a breathing treatment and the next she was having blood drawn.  Will try back later as time allowed.  Kreg Shropshire, DPT 03/17/2018, 9:41 AM

## 2018-03-18 LAB — CBC
HCT: 27.1 % — ABNORMAL LOW (ref 35.0–47.0)
Hemoglobin: 9.3 g/dL — ABNORMAL LOW (ref 12.0–16.0)
MCH: 28.2 pg (ref 26.0–34.0)
MCHC: 34.3 g/dL (ref 32.0–36.0)
MCV: 82.2 fL (ref 80.0–100.0)
Platelets: 237 10*3/uL (ref 150–440)
RBC: 3.29 MIL/uL — ABNORMAL LOW (ref 3.80–5.20)
RDW: 21.4 % — ABNORMAL HIGH (ref 11.5–14.5)
WBC: 10.8 10*3/uL (ref 3.6–11.0)

## 2018-03-18 LAB — GLUCOSE, CAPILLARY
Glucose-Capillary: 115 mg/dL — ABNORMAL HIGH (ref 70–99)
Glucose-Capillary: 123 mg/dL — ABNORMAL HIGH (ref 70–99)
Glucose-Capillary: 150 mg/dL — ABNORMAL HIGH (ref 70–99)
Glucose-Capillary: 156 mg/dL — ABNORMAL HIGH (ref 70–99)
Glucose-Capillary: 158 mg/dL — ABNORMAL HIGH (ref 70–99)
Glucose-Capillary: 180 mg/dL — ABNORMAL HIGH (ref 70–99)
Glucose-Capillary: 203 mg/dL — ABNORMAL HIGH (ref 70–99)

## 2018-03-18 LAB — BASIC METABOLIC PANEL
Anion gap: 6 (ref 5–15)
BUN: 57 mg/dL — ABNORMAL HIGH (ref 8–23)
CO2: 20 mmol/L — ABNORMAL LOW (ref 22–32)
Calcium: 7.6 mg/dL — ABNORMAL LOW (ref 8.9–10.3)
Chloride: 116 mmol/L — ABNORMAL HIGH (ref 98–111)
Creatinine, Ser: 1.01 mg/dL — ABNORMAL HIGH (ref 0.44–1.00)
GFR calc Af Amer: >60 mL/min (ref >60)
GFR calc non Af Amer: 53 mL/min — ABNORMAL LOW (ref >60)
Glucose, Bld: 111 mg/dL — ABNORMAL HIGH (ref 70–99)
Potassium: 2.9 mmol/L — ABNORMAL LOW (ref 3.5–5.1)
Sodium: 142 mmol/L (ref 135–145)

## 2018-03-18 LAB — PROTIME-INR
INR: 2.5
Prothrombin Time: 26.8 seconds — ABNORMAL HIGH (ref 11.4–15.2)

## 2018-03-18 MED ORDER — CIPROFLOXACIN HCL 500 MG PO TABS
500.0000 mg | ORAL_TABLET | Freq: Two times a day (BID) | ORAL | Status: DC
  Administered 2018-03-18 – 2018-03-20 (×5): 500 mg via ORAL
  Filled 2018-03-18 (×5): qty 1

## 2018-03-18 MED ORDER — GUAIFENESIN-DM 100-10 MG/5ML PO SYRP
5.0000 mL | ORAL_SOLUTION | ORAL | Status: DC | PRN
  Administered 2018-03-18 (×2): 5 mL via ORAL
  Filled 2018-03-18 (×3): qty 5

## 2018-03-18 MED ORDER — WARFARIN SODIUM 2 MG PO TABS
2.0000 mg | ORAL_TABLET | Freq: Every day | ORAL | Status: DC
  Administered 2018-03-18 – 2018-03-19 (×2): 2 mg via ORAL
  Filled 2018-03-18 (×3): qty 1

## 2018-03-18 MED ORDER — INSULIN ASPART 100 UNIT/ML ~~LOC~~ SOLN
0.0000 [IU] | Freq: Three times a day (TID) | SUBCUTANEOUS | Status: DC
  Administered 2018-03-18: 2 [IU] via SUBCUTANEOUS
  Administered 2018-03-18: 1 [IU] via SUBCUTANEOUS
  Administered 2018-03-19: 2 [IU] via SUBCUTANEOUS
  Filled 2018-03-18 (×3): qty 1

## 2018-03-18 MED ORDER — INSULIN ASPART 100 UNIT/ML ~~LOC~~ SOLN
0.0000 [IU] | Freq: Every day | SUBCUTANEOUS | Status: DC
  Filled 2018-03-18: qty 1

## 2018-03-18 MED ORDER — POTASSIUM CHLORIDE CRYS ER 20 MEQ PO TBCR
40.0000 meq | EXTENDED_RELEASE_TABLET | Freq: Two times a day (BID) | ORAL | Status: AC
  Administered 2018-03-18 (×2): 40 meq via ORAL
  Filled 2018-03-18 (×2): qty 2

## 2018-03-18 MED ORDER — FUROSEMIDE 10 MG/ML IJ SOLN
40.0000 mg | Freq: Once | INTRAMUSCULAR | Status: AC
  Administered 2018-03-18: 40 mg via INTRAVENOUS
  Filled 2018-03-18: qty 4

## 2018-03-18 MED ORDER — MAGNESIUM OXIDE 400 (241.3 MG) MG PO TABS
400.0000 mg | ORAL_TABLET | Freq: Every day | ORAL | Status: DC
  Administered 2018-03-18 – 2018-03-20 (×3): 400 mg via ORAL
  Filled 2018-03-18 (×3): qty 1

## 2018-03-18 NOTE — Progress Notes (Signed)
ANTICOAGULATION CONSULT NOTE - Initial Consult  Pharmacy Consult for warfarin Indication: AF  Allergies  Allergen Reactions  . Oxycodone   . Percocet [Oxycodone-Acetaminophen] Itching  . Penicillins Rash    Has patient had a PCN reaction causing immediate rash, facial/tongue/throat swelling, SOB or lightheadedness with hypotension: Yes Has patient had a PCN reaction causing severe rash involving mucus membranes or skin necrosis: Unknown Has patient had a PCN reaction that required hospitalization: Unknown Has patient had a PCN reaction occurring within the last 10 years: No If all of the above answers are "NO", then may proceed with Cephalosporin use.    Patient Measurements: Height: 5\' 6"  (167.6 cm) Weight: 246 lb (111.6 kg) IBW/kg (Calculated) : 59.3 Heparin Dosing Weight:   Vital Signs: Temp: 96.4 F (35.8 C) (06/26 0520) Temp Source: Axillary (06/26 0520) BP: 136/68 (06/26 0520) Pulse Rate: 68 (06/26 0520)  Labs: Recent Labs    03/16/18 0546 03/16/18 1256 03/17/18 0341 03/17/18 0923 03/18/18 0607  HGB 6.7* 8.3* 8.3*  --  9.3*  HCT 21.2* 25.5* 26.3*  --  27.1*  PLT 252  --  296  --  237  LABPROT  --   --   --  23.7* 26.8*  INR  --   --   --  2.14 2.50  CREATININE 1.32*  --   --   --   --     Estimated Creatinine Clearance: 47.3 mL/min (A) (by C-G formula based on SCr of 1.32 mg/dL (H)).   Medical History: Past Medical History:  Diagnosis Date  . Cancer ([place])    Breast and lung  . Diabetes mellitus without complication ([place])   . GERD (gastroesophageal reflux disease)   . Heart disease   . Hypertension     Medications:  Infusions:  . ciprofloxacin Stopped (03/17/18 2147)    Assessment: 81 yof here with shock from pseudomonas on ciprofloxacin and cefepime from urinary source. Patient takes warfarin PTA but on admission INR was greater than 10 so she received 10 mg of IV vitamin K x 1 dose on 03/13/18. Outpatient her VKA dose was recently reduced from  28 mg/wk to 26 mg/wk due to elevated INR. Her albumin has notably decreased this month which is likely contributing to elevated INR, along with sepsis. Pharmacy consulted to dose warfarin for AF.   DATE INR DOSE 6/21 >10 Held and given phytonadione 10 mg IV x 1 6/21  2.61 Held 6/22   Held 6/23  Held 6/24  Held 6/25 2.14 3 mg 6/26 2.5   Goal of Therapy:  INR 2-3 Monitor platelets by anticoagulation protocol: Yes   Plan:  Patient's INR rose to 2.5 this morning after receiving reduced dose VKA. I was expecting to see continued decrease after holding so many days, so we will decrease warfarin to 2 mg po daily starting today and continue to monitor INR daily while on antibiotics (DDI with ciprofloxacin)  Laural Benes, Pharm.D., BCPS Clinical Pharmacist 03/18/2018,7:45 AM

## 2018-03-18 NOTE — Progress Notes (Signed)
Patient ID: Tracy Dickerson, female   DOB: 06-03-1941, 77 y.o.   MRN: [id number]  [facility] PROGRESS NOTE  Tracy Dickerson DZH:[id number] DOB: 1941/09/08 DOA: 03/13/2018 PCP: Boykin Nearing, MD  HPI/Subjective: Patient still has a little bit of cough.  No further epigastric pain.  Breathing a little bit better.  Objective: Vitals:   03/18/18 1252 03/18/18 1422  BP: 139/71   Pulse: 75   Resp: 18   Temp: 97.7 F (36.5 C)   SpO2: 98% 96%    Filed Weights   03/13/18 1423  Weight: 111.6 kg (246 lb)    ROS: Review of Systems  Constitutional: Negative for chills and fever.  Eyes: Negative for blurred vision.  Respiratory: Positive for cough. Negative for shortness of breath.   Cardiovascular: Negative for chest pain.  Gastrointestinal: Negative for abdominal pain, constipation, diarrhea, nausea and vomiting.  Genitourinary: Negative for dysuria.  Musculoskeletal: Negative for joint pain.  Neurological: Negative for dizziness and headaches.   Exam: Physical Exam  HENT:  Nose: No mucosal edema.  Mouth/Throat: No oropharyngeal exudate or posterior oropharyngeal edema.  Eyes: Pupils are equal, round, and reactive to light. Conjunctivae, EOM and lids are normal.  Neck: No JVD present. Carotid bruit is not present. No edema present. No thyroid mass and no thyromegaly present.  Cardiovascular: S1 normal and S2 normal. Exam reveals no gallop.  No murmur heard. Pulses:      Dorsalis pedis pulses are 2+ on the right side, and 2+ on the left side.  Respiratory: No respiratory distress. She has decreased breath sounds in the right lower field and the left lower field. She has no wheezes. She has no rhonchi. She has rales in the right lower field and the left lower field.  GI: Soft. Bowel sounds are normal. There is no tenderness.  Musculoskeletal:       Right ankle: She exhibits swelling.       Left ankle: She exhibits swelling.  Cast on left foot.  Lymphadenopathy:    She  has no cervical adenopathy.  Neurological: She is alert. No cranial nerve deficit.  Skin: Skin is warm. No rash noted. Nails show no clubbing.  Psychiatric: She has a normal mood and affect.      Data Reviewed: Basic Metabolic Panel: Recent Labs  Lab 03/13/18 2210 03/14/18 0334 03/15/18 0539 03/16/18 0546 03/18/18 0607  NA 139 140 140 141 142  K 4.9 4.4 4.3 4.2 2.9*  CL 110 112* 117* 120* 116*  CO2 19* 19* 17* 16* 20*  GLUCOSE 152* 154* 118* 144* 111*  BUN 91* 87* 75* 61* 57*  CREATININE 2.73* 2.40* 1.74* 1.32* 1.01*  CALCIUM 7.4* 7.4* 7.3* 7.4* 7.6*  MG 2.7*  --   --   --   --   PHOS 4.3  --  4.4 4.0  --    Liver Function Tests: Recent Labs  Lab 03/13/18 1446 03/13/18 2210 03/15/18 0539 03/15/18 1002 03/16/18 0546  AST 70* 69*  --  41  --   ALT 28 28  --  17  --   ALKPHOS 137* 153*  --  106  --   BILITOT 0.8 1.0  --  0.6  --   PROT 7.2 7.4  --  5.7*  --   ALBUMIN 1.9* 2.0* 1.6* 1.2* 1.4*   CBC: Recent Labs  Lab 03/13/18 1446  03/14/18 0334 03/15/18 0539 03/16/18 0546 03/16/18 1256 03/17/18 0341 03/18/18 0607  WBC 22.6*   < >  22.5* 19.6* 9.4  --  11.5* 10.8  NEUTROABS 20.7*  --   --  17.3* 8.6*  --  10.4*  --   HGB 8.6*   < > 7.5* 7.8* 6.7* 8.3* 8.3* 9.3*  HCT 25.9*   < > 23.7* 25.2* 21.2* 25.5* 26.3* 27.1*  MCV 90.1   < > 82.3 82.0 81.2  --  80.8 82.2  PLT 434   < > 331 392 252  --  296 237   < > = values in this interval not displayed.   Cardiac Enzymes: Recent Labs  Lab 03/13/18 2210 03/14/18 0334 03/14/18 0941  TROPONINI 0.06* 0.04* 0.03*    CBG: Recent Labs  Lab 03/17/18 2006 03/18/18 0048 03/18/18 0359 03/18/18 0757 03/18/18 1200  GLUCAP 169* 156* 115* 123* 150*    Recent Results (from the past 240 hour(s))  Blood Culture (routine x 2)     Status: Abnormal   Collection Time: 03/13/18  2:27 PM  Result Value Ref Range Status   Specimen Description   Final    BLOOD R HAND Performed at [facility], [address], [place], [place] [address]    Special Requests   Final    BOTTLES DRAWN AEROBIC AND ANAEROBIC Blood Culture adequate volume Performed at [facility], [address], [place], [place] [address]    Culture  Setup Time   Final    GRAM NEGATIVE RODS AEROBIC BOTTLE ONLY CRITICAL RESULT CALLED TO, READ BACK BY AND VERIFIED WITH: LISA KLUTTZ ON 03/14/18 AT 1937 QSD Performed at [facility], [address] [place], [place] [address]    Culture (A)  Final    PSEUDOMONAS AERUGINOSA SUSCEPTIBILITIES PERFORMED ON PREVIOUS CULTURE WITHIN THE LAST 5 DAYS. Performed at [facility], [place] [address], [place], [place] [address]    Report Status 03/16/2018 FINAL  Final  Blood Culture (routine x 2)     Status: Abnormal   Collection Time: 03/13/18  2:27 PM  Result Value Ref Range Status   Specimen Description   Final    BLOOD L HAND Performed at [facility], [address], [place], [place] [address]    Special Requests   Final    BOTTLES DRAWN AEROBIC AND ANAEROBIC Blood Culture adequate volume Performed at [facility], [address] [place], [place] [address]    Culture  Setup Time   Final    Organism ID to follow GRAM NEGATIVE RODS AEROBIC BOTTLE ONLY CRITICAL RESULT CALLED TO, READ BACK BY AND VERIFIED WITH: LISA KLUTTZ ON 03/14/18 AT 0850 QSD Performed at [facility], [address] [place], [place] [address]    Culture PSEUDOMONAS AERUGINOSA (A)  Final   Report Status 03/16/2018 FINAL  Final   Organism ID, Bacteria PSEUDOMONAS AERUGINOSA  Final      Susceptibility   Pseudomonas aeruginosa - MIC*    CEFTAZIDIME 4 SENSITIVE Sensitive     CIPROFLOXACIN <=0.25 SENSITIVE Sensitive     GENTAMICIN <=1 SENSITIVE Sensitive     IMIPENEM 1 SENSITIVE Sensitive     PIP/TAZO 8 SENSITIVE Sensitive     CEFEPIME 2 SENSITIVE Sensitive     * PSEUDOMONAS AERUGINOSA  Blood Culture ID Panel (Reflexed)     Status: Abnormal   Collection Time:  03/13/18  2:27 PM  Result Value Ref Range Status   Enterococcus species NOT DETECTED NOT DETECTED Final   Listeria monocytogenes NOT DETECTED NOT DETECTED Final   Staphylococcus species NOT  DETECTED NOT DETECTED Final   Staphylococcus aureus NOT DETECTED NOT DETECTED Final   Streptococcus species NOT DETECTED NOT DETECTED Final   Streptococcus agalactiae NOT DETECTED NOT DETECTED Final   Streptococcus pneumoniae NOT DETECTED NOT DETECTED Final   Streptococcus pyogenes NOT DETECTED NOT DETECTED Final   Acinetobacter baumannii NOT DETECTED NOT DETECTED Final   Enterobacteriaceae species NOT DETECTED NOT DETECTED Final   Enterobacter cloacae complex NOT DETECTED NOT DETECTED Final   Escherichia coli NOT DETECTED NOT DETECTED Final   Klebsiella oxytoca NOT DETECTED NOT DETECTED Final   Klebsiella pneumoniae NOT DETECTED NOT DETECTED Final   Proteus species NOT DETECTED NOT DETECTED Final   Serratia marcescens NOT DETECTED NOT DETECTED Final   Carbapenem resistance NOT DETECTED NOT DETECTED Final   Haemophilus influenzae NOT DETECTED NOT DETECTED Final   Neisseria meningitidis NOT DETECTED NOT DETECTED Final   Pseudomonas aeruginosa DETECTED (A) NOT DETECTED Final    Comment: CRITICAL RESULT CALLED TO, READ BACK BY AND VERIFIED WITH: LISA KLUTTZ ON 03/14/18 AT 0850 QSD    Candida albicans NOT DETECTED NOT DETECTED Final   Candida glabrata NOT DETECTED NOT DETECTED Final   Candida krusei NOT DETECTED NOT DETECTED Final   Candida parapsilosis NOT DETECTED NOT DETECTED Final   Candida tropicalis NOT DETECTED NOT DETECTED Final    Comment: Performed at [facility], [address] [place], [place] [address]  Urine culture     Status: Abnormal   Collection Time: 03/13/18  2:40 PM  Result Value Ref Range Status   Specimen Description   Final    URINE, RANDOM Performed at [facility], [address] [place], [place] [address]    Special Requests   Final     NONE Performed at [facility], [address] [place], [place] [address]    Culture (A)  Final    >=100,000 COLONIES/mL PSEUDOMONAS AERUGINOSA 80,000 COLONIES/mL ESCHERICHIA COLI    Report Status 03/17/2018 FINAL  Final   Organism ID, Bacteria PSEUDOMONAS AERUGINOSA (A)  Final   Organism ID, Bacteria ESCHERICHIA COLI (A)  Final      Susceptibility   Escherichia coli - MIC*    AMPICILLIN 8 SENSITIVE Sensitive     CEFAZOLIN <=4 SENSITIVE Sensitive     CEFTRIAXONE <=1 SENSITIVE Sensitive     CIPROFLOXACIN <=0.25 SENSITIVE Sensitive     GENTAMICIN <=1 SENSITIVE Sensitive     IMIPENEM <=0.25 SENSITIVE Sensitive     NITROFURANTOIN <=16 SENSITIVE Sensitive     TRIMETH/SULFA <=20 SENSITIVE Sensitive     AMPICILLIN/SULBACTAM 8 SENSITIVE Sensitive     PIP/TAZO <=4 SENSITIVE Sensitive     Extended ESBL NEGATIVE Sensitive     * 80,000 COLONIES/mL ESCHERICHIA COLI   Pseudomonas aeruginosa - MIC*    CEFTAZIDIME 4 SENSITIVE Sensitive     CIPROFLOXACIN <=0.25 SENSITIVE Sensitive     GENTAMICIN 2 SENSITIVE Sensitive     IMIPENEM 1 SENSITIVE Sensitive     PIP/TAZO 8 SENSITIVE Sensitive     CEFEPIME 2 SENSITIVE Sensitive     * >=100,000 COLONIES/mL PSEUDOMONAS AERUGINOSA  MRSA PCR Screening     Status: None   Collection Time: 03/13/18  9:27 PM  Result Value Ref Range Status   MRSA by PCR NEGATIVE NEGATIVE Final    Comment:        The GeneXpert MRSA Assay (FDA approved for NASAL specimens only), is one component of a comprehensive MRSA colonization surveillance program. It is not intended to diagnose MRSA  infection nor to guide or monitor treatment for MRSA infections. Performed at [facility], [address] [place], [place] [address]   CULTURE, BLOOD (ROUTINE X 2) w Reflex to ID Panel     Status: None (Preliminary result)   Collection Time: 03/15/18  9:32 PM  Result Value Ref Range Status   Specimen Description BLOOD RIGHT HAND  Final   Special Requests    Final    BOTTLES DRAWN AEROBIC AND ANAEROBIC Blood Culture adequate volume   Culture   Final    NO GROWTH 3 DAYS Performed at [facility], [address] [place], [place] [address]    Report Status PENDING  Incomplete  CULTURE, BLOOD (ROUTINE X 2) w Reflex to ID Panel     Status: None (Preliminary result)   Collection Time: 03/15/18  9:41 PM  Result Value Ref Range Status   Specimen Description BLOOD BLOOD LEFT ARM  Final   Special Requests   Final    BOTTLES DRAWN AEROBIC AND ANAEROBIC Blood Culture adequate volume   Culture   Final    NO GROWTH 3 DAYS Performed at [facility], [address], [place], [place] [address]    Report Status PENDING  Incomplete     Studies: Dg Chest Port 1 View  Result Date: 03/17/2018 CLINICAL DATA:  Breast cancer. Lung cancer. Productive cough. Fever. EXAM: PORTABLE CHEST 1 VIEW COMPARISON:  03/15/2018. FINDINGS: PICC line noted with tip at cavoatrial junction. Cardiomegaly with normal pulmonary vascularity. Bilateral pulmonary interstitial prominence and bilateral pleural effusions, right side greater than left. Findings consistent CHF. Surgical sutures are noted over the right chest. No acute bony abnormality. IMPRESSION: 1.  Right PICC line noted with tip at cavoatrial junction. 2. Cardiomegaly with bilateral interstitial prominence and right-sided pleural effusion consistent with CHF. Electronically Signed   By: [place]   On: 03/17/2018 10:53    Scheduled Meds: . atorvastatin  80 mg Oral Daily  . chlorhexidine  15 mL Mouth Rinse BID  . ciprofloxacin  500 mg Oral BID  . famotidine  20 mg Oral BID  . ferrous sulfate  325 mg Oral Daily  . folic acid  1 mg Oral Daily  . gabapentin  300 mg Oral QHS  . insulin aspart  0-5 Units Subcutaneous QHS  . insulin aspart  0-9 Units Subcutaneous TID WC  . ipratropium-albuterol  3 mL Nebulization Q6H  . magnesium oxide  400 mg Oral Daily  . mouth rinse  15 mL Mouth Rinse q12n4p   . pantoprazole  40 mg Oral Daily  . potassium chloride  40 mEq Oral BID  . pramipexole  0.25 mg Oral QHS  . warfarin  2 mg Oral q1800  . Warfarin - Pharmacist Dosing Inpatient   Does not apply q1800   Continuous Infusions:   Assessment/Plan:  1. Septic shock with Pseudomonas.  Sources acute cystitis with hematuria.  Patient will go home with Foley catheter.   currently off pressors.  Downgrade antibiotics to single coverage with Cipro.  Taper off stress dose steroids. 2. Fluid overload.  Give another dose of IV Lasix today 3. Hypokalemia.  Replace potassium orally and recheck again tomorrow morning. 4. Acute kidney injury on chronic kidney disease stage III.  Secondary to septic shock.  IV fluid hydration.  Creatinine has improved to 1.01. 5. Hyperlipidemia unspecified on atorvastatin 6. diabetic neuropathy on gabapentin 7. GERD on Protonix 8. Restless leg syndrome on Mirapex 9. Obesity.  Weight loss needed.  10. Atrial fibrillation.    INR still elevated at 2.14.  Cipro and Coumadin can potentially do this.  Careful with Coumadin dosing. 11. Anemia unspecified.  Patient transfused 1 unit of packed red blood cells on a hemoglobin of 6.7.  Repeat hemoglobin 9.3.  Code Status:     Code Status Orders  (From admission, onward)        Start     Ordered   03/13/18 2021  Full code  Continuous     03/13/18 2020    Code Status History    Date Active Date Inactive Code Status Order ID Comments User Context   02/13/2018 2026 02/16/2018 1637 Full Code [id number]  Gorden Harms, MD Inpatient   02/02/2018 2159 02/03/2018 2049 Full Code [id number]  Corky Mull, MD Inpatient   10/29/2017 1816 10/31/2017 1816 DNR [id number]  Hillary Bow, MD ED   01/26/2017 2250 01/27/2017 1427 Full Code [id number]  Henreitta Leber, MD ED     Family Communication:  spoke with brothers on the phone yesterday Disposition Plan: Potentially back to facility on Thursday or Friday.  Consultants:  Critical care  specialist  Antibiotics:  Cefepime  Cipro  Time spent: 26 minutes  Gravity

## 2018-03-19 LAB — GLUCOSE, CAPILLARY
Glucose-Capillary: 99 mg/dL (ref 70–99)
Glucose-Capillary: 108 mg/dL — ABNORMAL HIGH (ref 70–99)
Glucose-Capillary: 184 mg/dL — ABNORMAL HIGH (ref 70–99)
Glucose-Capillary: 132 mg/dL — ABNORMAL HIGH (ref 70–99)

## 2018-03-19 LAB — BASIC METABOLIC PANEL
Anion gap: 8 (ref 5–15)
BUN: 47 mg/dL — ABNORMAL HIGH (ref 8–23)
CO2: 21 mmol/L — ABNORMAL LOW (ref 22–32)
Calcium: 7.7 mg/dL — ABNORMAL LOW (ref 8.9–10.3)
Chloride: 113 mmol/L — ABNORMAL HIGH (ref 98–111)
Creatinine, Ser: 1.02 mg/dL — ABNORMAL HIGH (ref 0.44–1.00)
GFR calc Af Amer: >60 mL/min (ref >60)
GFR calc non Af Amer: 53 mL/min — ABNORMAL LOW (ref >60)
Glucose, Bld: 86 mg/dL (ref 70–99)
Potassium: 3.4 mmol/L — ABNORMAL LOW (ref 3.5–5.1)
Sodium: 142 mmol/L (ref 135–145)

## 2018-03-19 LAB — MAGNESIUM: Magnesium: 1.6 mg/dL — ABNORMAL LOW (ref 1.7–2.4)

## 2018-03-19 LAB — PROTIME-INR
INR: 2.61
Prothrombin Time: 27.7 seconds — ABNORMAL HIGH (ref 11.4–15.2)

## 2018-03-19 MED ORDER — CALCIUM CARBONATE ANTACID 500 MG PO CHEW
1.0000 | CHEWABLE_TABLET | Freq: Four times a day (QID) | ORAL | Status: DC | PRN
  Administered 2018-03-19 (×2): 200 mg via ORAL
  Filled 2018-03-19 (×2): qty 1

## 2018-03-19 MED ORDER — MAGNESIUM SULFATE 2 GM/50ML IV SOLN
2.0000 g | Freq: Once | INTRAVENOUS | Status: AC
  Administered 2018-03-19: 2 g via INTRAVENOUS
  Filled 2018-03-19: qty 50

## 2018-03-19 MED ORDER — FUROSEMIDE 10 MG/ML IJ SOLN
40.0000 mg | Freq: Once | INTRAMUSCULAR | Status: AC
  Administered 2018-03-19: 40 mg via INTRAVENOUS
  Filled 2018-03-19: qty 4

## 2018-03-19 NOTE — Progress Notes (Signed)
Patient ID: Tracy Dickerson, female   DOB: [birth year] Dick[birth year], 77 y.o.   MRN: [id number]  [facility] [employer] PROGRESS NOTE  Tracy Dickerson PJK:[id number] DOB: Tracy Dickerson, Tracy Dickerson DOA: 03/13/2018 PCP: Boykin Nearing, MD  HPI/Subjective: Patient having epigastric pain.  She states she takes Tylenol at home and that helps.  Still having some cough.  Still very weak.  Objective: Vitals:   03/19/18 0722 03/19/18 1409  BP:    Pulse:    Resp:    Temp:    SpO2: 97% 99%    Filed Weights   03/13/18 1423  Weight: 111.6 kg (246 lb)    ROS: Review of Systems  Constitutional: Negative for chills and fever.  Eyes: Negative for blurred vision.  Respiratory: Positive for cough. Negative for shortness of breath.   Cardiovascular: Negative for chest pain.  Gastrointestinal: Positive for abdominal pain. Negative for constipation, diarrhea, nausea and vomiting.  Genitourinary: Negative for dysuria.  Musculoskeletal: Negative for joint pain.  Neurological: Negative for dizziness and headaches.   Exam: Physical Exam  HENT:  Nose: No mucosal edema.  Mouth/Throat: No oropharyngeal exudate or posterior oropharyngeal edema.  Eyes: Pupils are equal, round, and reactive to light. Conjunctivae, EOM and lids are normal.  Neck: No JVD present. Carotid bruit is not present. No edema present. No thyroid mass and no thyromegaly present.  Cardiovascular: S1 normal and S2 normal. Exam reveals no gallop.  No murmur heard. Pulses:      Dorsalis pedis pulses are 2+ on the right side, and 2+ on the left side.  Respiratory: No respiratory distress. She has decreased breath sounds in the right lower field and the left lower field. She has no wheezes. She has no rhonchi. She has rales in the right lower field and the left lower field.  GI: Soft. Bowel sounds are normal. There is tenderness in the epigastric area.  Musculoskeletal:       Right ankle: She exhibits swelling.       Left ankle: She exhibits swelling.  Cast on left  foot.  Lymphadenopathy:    She has no cervical adenopathy.  Neurological: She is alert. No cranial nerve deficit.  Skin: Skin is warm. No rash noted. Nails show no clubbing.  Psychiatric: She has a normal mood and affect.      Data Reviewed: Basic Metabolic Panel: Recent Labs  Lab 03/13/18 2210 03/14/18 0334 03/15/18 0539 03/16/18 0546 03/18/18 0607 03/19/18 0528  NA 139 140 140 141 142 142  K 4.9 4.4 4.3 4.2 2.9* 3.4*  CL 110 112* 117* 120* 116* 113*  CO2 19* 19* 17* 16* 20* 21*  GLUCOSE 152* 154* 118* 144* 111* 86  BUN 91* 87* 75* 61* 57* 47*  CREATININE 2.73* 2.40* 1.74* 1.32* 1.01* 1.02*  CALCIUM 7.4* 7.4* 7.3* 7.4* 7.6* 7.7*  MG 2.7*  --   --   --   --  1.6*  PHOS 4.3  --  4.4 4.0  --   --    Liver Function Tests: Recent Labs  Lab 03/13/18 1446 03/13/18 2210 03/15/18 0539 03/15/18 1002 03/16/18 0546  AST 70* 69*  --  41  --   ALT 28 28  --  17  --   ALKPHOS 137* 153*  --  106  --   BILITOT 0.8 1.0  --  0.6  --   PROT 7.2 7.4  --  5.7*  --   ALBUMIN 1.9* 2.0* 1.6* 1.2* 1.4*   CBC: Recent Labs  Lab 03/13/18  1446  03/14/18 0334 03/15/18 0539 03/16/18 0546 03/16/18 1256 03/17/18 0341 03/18/18 0607  WBC 22.6*   < > 22.5* 19.6* 9.4  --  11.5* 10.8  NEUTROABS 20.7*  --   --  17.3* 8.6*  --  10.4*  --   HGB 8.6*   < > 7.5* 7.8* 6.7* 8.3* 8.3* 9.3*  HCT 25.9*   < > 23.7* 25.2* 21.2* 25.5* 26.3* 27.1*  MCV 90.1   < > 82.3 82.0 81.2  --  80.8 82.2  PLT 434   < > 331 392 252  --  296 237   < > = values in this interval not displayed.   Cardiac Enzymes: Recent Labs  Lab 03/13/18 2210 03/14/18 0334 03/14/18 0941  TROPONINI 0.06* 0.04* 0.03*    CBG: Recent Labs  Lab 03/18/18 1724 03/18/18 2010 03/18/18 2123 03/19/18 0740 03/19/18 1138  GLUCAP 180* 203* 158* 99 108*    Recent Results (from the past 240 hour(s))  Blood Culture (routine x 2)     Status: Abnormal   Collection Time: 03/13/18  2:27 PM  Result Value Ref Range Status   Specimen  Description   Final    BLOOD R HAND Performed at [facility], [address] [place], [place] [address]    Special Requests   Final    BOTTLES DRAWN AEROBIC AND ANAEROBIC Blood Culture adequate volume Performed at [facility], [address] [place], [place] [address]    Culture  Setup Time   Final    GRAM NEGATIVE RODS AEROBIC BOTTLE ONLY CRITICAL RESULT CALLED TO, READ BACK BY AND VERIFIED WITH: LISA KLUTTZ ON 03/14/18 AT 0850 QSD Performed at [facility], [address] [place], [place] [address]    Culture (A)  Final    PSEUDOMONAS AERUGINOSA SUSCEPTIBILITIES PERFORMED ON PREVIOUS CULTURE WITHIN THE LAST 5 DAYS. Performed at [facility], [place] [address] [place], [place] [address]    Report Status 03/16/2018 FINAL  Final  Blood Culture (routine x 2)     Status: Abnormal   Collection Time: 03/13/18  2:27 PM  Result Value Ref Range Status   Specimen Description   Final    BLOOD L HAND Performed at [facility], [address], [place], [place] [address]    Special Requests   Final    BOTTLES DRAWN AEROBIC AND ANAEROBIC Blood Culture adequate volume Performed at [facility], [address] [place], [place] [address]    Culture  Setup Time   Final    Organism ID to follow GRAM NEGATIVE RODS AEROBIC BOTTLE ONLY CRITICAL RESULT CALLED TO, READ BACK BY AND VERIFIED WITH: LISA KLUTTZ ON 03/14/18 AT 0850 QSD Performed at [facility], [address] [place], [place] [address]    Culture PSEUDOMONAS AERUGINOSA (A)  Final   Report Status 03/16/2018 FINAL  Final   Organism ID, Bacteria PSEUDOMONAS AERUGINOSA  Final      Susceptibility   Pseudomonas aeruginosa - MIC*    CEFTAZIDIME 4 SENSITIVE Sensitive     CIPROFLOXACIN <=0.25 SENSITIVE Sensitive     GENTAMICIN <=1 SENSITIVE Sensitive     IMIPENEM 1 SENSITIVE Sensitive     PIP/TAZO 8 SENSITIVE Sensitive     CEFEPIME 2 SENSITIVE Sensitive     *  PSEUDOMONAS AERUGINOSA  Blood Culture ID Panel (Reflexed)     Status: Abnormal   Collection Time: 03/13/18  2:27 PM  Result Value Ref Range Status  Enterococcus species NOT DETECTED NOT DETECTED Final   Listeria monocytogenes NOT DETECTED NOT DETECTED Final   Staphylococcus species NOT DETECTED NOT DETECTED Final   Staphylococcus aureus NOT DETECTED NOT DETECTED Final   Streptococcus species NOT DETECTED NOT DETECTED Final   Streptococcus agalactiae NOT DETECTED NOT DETECTED Final   Streptococcus pneumoniae NOT DETECTED NOT DETECTED Final   Streptococcus pyogenes NOT DETECTED NOT DETECTED Final   Acinetobacter baumannii NOT DETECTED NOT DETECTED Final   Enterobacteriaceae species NOT DETECTED NOT DETECTED Final   Enterobacter cloacae complex NOT DETECTED NOT DETECTED Final   Escherichia coli NOT DETECTED NOT DETECTED Final   Klebsiella oxytoca NOT DETECTED NOT DETECTED Final   Klebsiella pneumoniae NOT DETECTED NOT DETECTED Final   Proteus species NOT DETECTED NOT DETECTED Final   Serratia marcescens NOT DETECTED NOT DETECTED Final   Carbapenem resistance NOT DETECTED NOT DETECTED Final   Haemophilus influenzae NOT DETECTED NOT DETECTED Final   Neisseria meningitidis NOT DETECTED NOT DETECTED Final   Pseudomonas aeruginosa DETECTED (A) NOT DETECTED Final    Comment: CRITICAL RESULT CALLED TO, READ BACK BY AND VERIFIED WITH: LISA KLUTTZ ON 03/14/18 AT 0850 QSD    Candida albicans NOT DETECTED NOT DETECTED Final   Candida glabrata NOT DETECTED NOT DETECTED Final   Candida krusei NOT DETECTED NOT DETECTED Final   Candida parapsilosis NOT DETECTED NOT DETECTED Final   Candida tropicalis NOT DETECTED NOT DETECTED Final    Comment: Performed at [facility], [address] [place], [place] [address]  Urine culture     Status: Abnormal   Collection Time: 03/13/18  2:40 PM  Result Value Ref Range Status   Specimen Description   Final    URINE, RANDOM Performed at [facility], [address], [place], [place] [address]    Special Requests   Final    NONE Performed at [facility], [address] [place], [place] [address]    Culture (A)  Final    >=100,000 COLONIES/mL PSEUDOMONAS AERUGINOSA 80,000 COLONIES/mL ESCHERICHIA COLI    Report Status 03/17/2018 FINAL  Final   Organism ID, Bacteria PSEUDOMONAS AERUGINOSA (A)  Final   Organism ID, Bacteria ESCHERICHIA COLI (A)  Final      Susceptibility   Escherichia coli - MIC*    AMPICILLIN 8 SENSITIVE Sensitive     CEFAZOLIN <=4 SENSITIVE Sensitive     CEFTRIAXONE <=1 SENSITIVE Sensitive     CIPROFLOXACIN <=0.25 SENSITIVE Sensitive     GENTAMICIN <=1 SENSITIVE Sensitive     IMIPENEM <=0.25 SENSITIVE Sensitive     NITROFURANTOIN <=16 SENSITIVE Sensitive     TRIMETH/SULFA <=20 SENSITIVE Sensitive     AMPICILLIN/SULBACTAM 8 SENSITIVE Sensitive     PIP/TAZO <=4 SENSITIVE Sensitive     Extended ESBL NEGATIVE Sensitive     * 80,000 COLONIES/mL ESCHERICHIA COLI   Pseudomonas aeruginosa - MIC*    CEFTAZIDIME 4 SENSITIVE Sensitive     CIPROFLOXACIN <=0.25 SENSITIVE Sensitive     GENTAMICIN 2 SENSITIVE Sensitive     IMIPENEM 1 SENSITIVE Sensitive     PIP/TAZO 8 SENSITIVE Sensitive     CEFEPIME 2 SENSITIVE Sensitive     * >=100,000 COLONIES/mL PSEUDOMONAS AERUGINOSA  MRSA PCR Screening     Status: None   Collection Time: 03/13/18  9:27 PM  Result Value Ref Range Status   MRSA by PCR NEGATIVE NEGATIVE Final    Comment:        The GeneXpert MRSA Assay (FDA approved  for NASAL specimens only), is one component of a comprehensive MRSA colonization surveillance program. It is not intended to diagnose MRSA infection nor to guide or monitor treatment for MRSA infections. Performed at [facility], [address] [place], [place] [address]   CULTURE, BLOOD (ROUTINE X 2) w Reflex to ID Panel     Status: None (Preliminary result)   Collection Time: 03/15/18  9:32 PM  Result  Value Ref Range Status   Specimen Description BLOOD RIGHT HAND  Final   Special Requests   Final    BOTTLES DRAWN AEROBIC AND ANAEROBIC Blood Culture adequate volume   Culture   Final    NO GROWTH 4 DAYS Performed at [facility], [address], [place], [place] [address]    Report Status PENDING  Incomplete  CULTURE, BLOOD (ROUTINE X 2) w Reflex to ID Panel     Status: None (Preliminary result)   Collection Time: 03/15/18  9:41 PM  Result Value Ref Range Status   Specimen Description BLOOD BLOOD LEFT ARM  Final   Special Requests   Final    BOTTLES DRAWN AEROBIC AND ANAEROBIC Blood Culture adequate volume   Culture   Final    NO GROWTH 4 DAYS Performed at [facility], [address] [place],  [address]    Report Status PENDING  Incomplete     Studies: No results found.  Scheduled Meds: . atorvastatin  80 mg Oral Daily  . chlorhexidine  15 mL Mouth Rinse BID  . ciprofloxacin  500 mg Oral BID  . famotidine  20 mg Oral BID  . ferrous sulfate  325 mg Oral Daily  . folic acid  1 mg Oral Daily  . gabapentin  300 mg Oral QHS  . insulin aspart  0-5 Units Subcutaneous QHS  . insulin aspart  0-9 Units Subcutaneous TID WC  . ipratropium-albuterol  3 mL Nebulization Q6H  . magnesium oxide  400 mg Oral Daily  . mouth rinse  15 mL Mouth Rinse q12n4p  . pantoprazole  40 mg Oral Daily  . pramipexole  0.25 mg Oral QHS  . warfarin  2 mg Oral q1800  . Warfarin - Pharmacist Dosing Inpatient   Does not apply q1800    Assessment/Plan:  1. Septic shock with Pseudomonas.  Sources acute cystitis with hematuria.  Patient will go home with Foley catheter.   currently off pressors.  Downgrade antibiotics to single coverage with Cipro.  Discontinued stress dose steroids. 2. Fluid overload.  Give another dose of IV Lasix today 3. Epigastric abdominal pain and GERD on Protonix and Pepcid.  Gave a few Tums today with good response. 4. Hypokalemia.  Replaced 5. Acute  kidney injury on chronic kidney disease stage III.  Secondary to septic shock.  IV fluid hydration.  Creatinine has improved to 1.02. 6. Hyperlipidemia unspecified on atorvastatin 7. diabetic neuropathy on gabapentin 8. GERD on Protonix 9. Restless leg syndrome on Mirapex 10. Obesity.  Weight loss needed. 11. Atrial fibrillation.    INR 2.61.  Monitor closely while restarting Coumadin. 12. Anemia unspecified.  Patient transfused 1 unit of packed red blood cells on a hemoglobin of 6.7.  Repeat hemoglobin 9.3.  Code Status:     Code Status Orders  (From admission, onward)        Start     Ordered   03/13/18 2021  Full code  Continuous     03/13/18 2020    Code Status History  Date Active Date Inactive Code Status Order ID Comments User Context   02/13/2018 2026 02/16/2018 1637 Full Code [id number]  Gorden Harms, MD Inpatient   02/02/2018 2159 02/03/2018 2049 Full Code [id number]  Corky Mull, MD Inpatient   10/29/2017 1816 10/31/2017 1816 DNR [id number]  Hillary Bow, MD ED   01/26/2017 2250 01/27/2017 1427 Full Code [id number]  Henreitta Leber, MD ED     Disposition Plan: Potentially back to facility on Friday.  Antibiotics:  Cipro  Time spent: 26 minutes  [place]

## 2018-03-19 NOTE — Progress Notes (Addendum)
Speech Language Pathology Treatment: Dysphagia  Patient Details Name: Tracy Dickerson MRN: [id number] DOB: [birth year] Today's Date: 03/19/2018 Time: 4801-6553 SLP Time Calculation (min) (ACUTE ONLY): 42 min  Assessment / Plan / Recommendation Clinical Impression  Pt seen for ongoing toleration of diet; education and information on general aspiration precautions; Reflux precautions as pt does have GERD baseline - any regurgitation of Reflux material can increase risk for aspiration of such Reflux material thus impacting the respiratory system. Such Reflux presentation needs monitoring by GI/MD.  Pt helped to position herself upright in bed but needed min more elevation. Pt recalled she should not use straw when drinking as well. Pt given min tray setup the fed self several boluses of mech soft foods alternating w/ thin liquids via Cup. No overt s/s of aspiration were noted during the meal; no decline in vocal quality or coughing/throat clearing. Pt was instructed to take rest breaks intermittently during the meal to lessen fatigue/SOB from the exertion of self-feeding and eating of meal. Oral phase was grossly [facility] for bolus management then A-P transfer and swallowing; oral clearing appropriate. Education discussed w/ pt on aspiration precautions; food preparation and choices(softened, moistened); monitoring her breathing and taking rest breaks during meals as needed to lessen SOB w/ exertion of the meal; Reflux precautions. Pt appears to be at her baseline w/ swallowing as per her description; goal met and education performed. Recommend continue a mech soft diet for easier mastication (d/t Dentition status and fatigue/SOB) w/ thin liquids; general aspiration precautions; Pills WHOLE in Puree w/ NSG for safer swallowing. NSG to reconsult if any decline in status while admitted. Recommend f/u w/ GI for management of GERD.     HPI HPI: 77 y.o. female with a known history of breast and Lung cancer per chart,  diabetes mellitus type 2, GERD, hypertension presented to from the [facility] facility for lethargy and fever.  Agent on oral Coumadin for anticoagulation at the facility.  She was found to have fever of 102 F in the nursing home.  Patient has foul-smelling urine also lower abdominal discomfort.  She has weakness and fatigue.  Patient is on oxygen at the facility.  Patient was evaluated in the emergency room was found to have urinary tract infection and sepsis.  Her blood pressure was low initially when she presented to the emergency room and she was resuscitated with IV fluids.  She was started on broad-spectrum IV antibiotics and urinalysis showed infection.  Patient's INR was more than 10 and she received IV vitamin K.  Pt has been tolerating her current diet w/ thin liquids adequately w/ no reports of deficits per NSG, pt.  Pt does endorse "acid reflux".       SLP Plan  All goals met       Recommendations  Diet recommendations: Dysphagia 3 (mechanical soft);Thin liquid Liquids provided via: Cup;No straw Medication Administration: Whole meds with puree(for safer swallowing) Supervision: Patient able to self feed;Intermittent supervision to cue for compensatory strategies(tray setup as needed) Compensations: Minimize environmental distractions;Slow rate;Small sips/bites;Lingual sweep for clearance of pocketing;Multiple dry swallows after each bite/sip;Follow solids with liquid Postural Changes and/or Swallow Maneuvers: Seated upright 90 degrees;Upright 30-60 min after meal(Reflux precautions)                General recommendations: (Dietician f/u) Oral Care Recommendations: Oral care BID;Staff/trained caregiver to provide oral care(assist) Follow up Recommendations: (TBD by PT) SLP Visit Diagnosis: Dysphagia, oral phase (R13.11)(lacking dentures ) Plan: All goals met  [place], MS, CCC-SLP Genevie Elman 03/19/2018, 2:59 PM

## 2018-03-19 NOTE — Progress Notes (Signed)
ANTICOAGULATION CONSULT NOTE - Initial Consult  Pharmacy Consult for warfarin Indication: AF  Allergies  Allergen Reactions  . Oxycodone   . Percocet [Oxycodone-Acetaminophen] Itching  . Penicillins Rash    Has patient had a PCN reaction causing immediate rash, facial/tongue/throat swelling, SOB or lightheadedness with hypotension: Yes Has patient had a PCN reaction causing severe rash involving mucus membranes or skin necrosis: Unknown Has patient had a PCN reaction that required hospitalization: Unknown Has patient had a PCN reaction occurring within the last 10 years: No If all of the above answers are "NO", then may proceed with Cephalosporin use.    Patient Measurements: Height: 5\' 6"  (167.6 cm) Weight: 246 lb (111.6 kg) IBW/kg (Calculated) : 59.3 Heparin Dosing Weight:   Vital Signs: Temp: 97.5 F (36.4 C) (06/27 0457) Temp Source: Oral (06/27 0457) BP: 121/62 (06/27 0457) Pulse Rate: 79 (06/27 0457)  Labs: Recent Labs    03/16/18 1256 03/17/18 0341 03/17/18 0923 03/18/18 0607 03/19/18 0528  HGB 8.3* 8.3*  --  9.3*  --   HCT 25.5* 26.3*  --  27.1*  --   PLT  --  296  --  237  --   LABPROT  --   --  23.7* 26.8* 27.7*  INR  --   --  2.14 2.50 2.61  CREATININE  --   --   --  1.01*  --     Estimated Creatinine Clearance: 61.9 mL/min (A) (by C-G formula based on SCr of 1.01 mg/dL (H)).   Medical History: Past Medical History:  Diagnosis Date  . Cancer ([place])    Breast and lung  . Diabetes mellitus without complication ([place])   . GERD (gastroesophageal reflux disease)   . Heart disease   . Hypertension     Medications:  Infusions:    Assessment: 36 yof here with shock from pseudomonas on ciprofloxacin and cefepime from urinary source. Patient takes warfarin PTA but on admission INR was greater than 10 so she received 10 mg of IV vitamin K x 1 dose on 03/13/18. Outpatient her VKA dose was recently reduced from 28 mg/wk to 26 mg/wk due to elevated INR. Her  albumin has notably decreased this month which is likely contributing to elevated INR, along with sepsis. Pharmacy consulted to dose warfarin for AF.   DATE INR DOSE 6/21 >10 Held and given phytonadione 10 mg IV x 1 6/21  2.61 Held 6/22   Held 6/23  Held 6/24  Held 6/25 2.14 3 mg 6/26 2.5 2 mg 6/27 2.61  Goal of Therapy:  INR 2-3 Monitor platelets by anticoagulation protocol: Yes   Plan:  Continue warfarin 2 mg po daily and continue to monitor INR daily while on antibiotics (DDI with ciprofloxacin)  Laural Benes, Pharm.D., BCPS Clinical Pharmacist 03/19/2018,7:38 AM

## 2018-03-20 LAB — PROTIME-INR
INR: 2.43
Prothrombin Time: 26.2 seconds — ABNORMAL HIGH (ref 11.4–15.2)

## 2018-03-20 LAB — CULTURE, BLOOD (ROUTINE X 2)
Culture: NO GROWTH
Culture: NO GROWTH
Special Requests: ADEQUATE
Special Requests: ADEQUATE

## 2018-03-20 LAB — GLUCOSE, CAPILLARY
Glucose-Capillary: 102 mg/dL — ABNORMAL HIGH (ref 70–99)
Glucose-Capillary: 91 mg/dL (ref 70–99)

## 2018-03-20 MED ORDER — CIPROFLOXACIN HCL 500 MG PO TABS: 500.0000 mg | ORAL_TABLET | Freq: Two times a day (BID) | ORAL | 0 refills | Status: AC

## 2018-03-20 MED ORDER — FAMOTIDINE 20 MG PO TABS: 20.0000 mg | ORAL_TABLET | Freq: Two times a day (BID) | ORAL | 0 refills | Status: DC

## 2018-03-20 MED ORDER — WARFARIN SODIUM 2 MG PO TABS: 2.0000 mg | ORAL_TABLET | Freq: Every day | ORAL | 0 refills | Status: DC

## 2018-03-20 MED ORDER — FOLIC ACID 1 MG PO TABS: 1.0000 mg | ORAL_TABLET | Freq: Every day | ORAL | 0 refills | Status: DC

## 2018-03-20 MED ORDER — CALCIUM CARBONATE ANTACID 500 MG PO CHEW: 1.0000 | CHEWABLE_TABLET | Freq: Four times a day (QID) | ORAL | 0 refills | Status: DC | PRN

## 2018-03-20 MED ORDER — ACETAMINOPHEN 325 MG PO TABS: 650.0000 mg | ORAL_TABLET | Freq: Four times a day (QID) | ORAL | Status: DC | PRN

## 2018-03-20 MED ORDER — TRAMADOL HCL 50 MG PO TABS: 50.0000 mg | ORAL_TABLET | Freq: Four times a day (QID) | ORAL | 0 refills | Status: DC | PRN

## 2018-03-20 MED ORDER — MAGNESIUM OXIDE 400 (241.3 MG) MG PO TABS: 400.0000 mg | ORAL_TABLET | Freq: Every day | ORAL | 0 refills | Status: DC

## 2018-03-20 MED ORDER — FUROSEMIDE 20 MG PO TABS: 20.0000 mg | ORAL_TABLET | Freq: Every day | ORAL | 0 refills | Status: DC

## 2018-03-20 NOTE — Progress Notes (Signed)
Patient left the unit via EMS to [facility], no acute distress noted

## 2018-03-20 NOTE — Clinical Social Work Note (Signed)
Patient is medically ready for discharge back to [facility] today. CSW notified patient and patient's brother Berneice Heinrich [phone] of discharge. CSW also notified Claiborne Billings at [facility] of discharge today. CSW sent discharge information via [place]. RN to call report and call for transport.   [place], Kimberly

## 2018-03-20 NOTE — Care Management Important Message (Signed)
Important Message  Patient Details  Name: Tracy Dickerson MRN: [id number] Date of Birth: 01/27/1941   Medicare Important Message Given:  Yes    Juliann Pulse A Gillis Boardley 03/20/2018, 9:36 AM

## 2018-03-20 NOTE — Progress Notes (Signed)
ANTICOAGULATION CONSULT NOTE - Initial Consult  Pharmacy Consult for warfarin Indication: AF  Allergies  Allergen Reactions  . Oxycodone   . Percocet [Oxycodone-Acetaminophen] Itching  . Penicillins Rash    Has patient had a PCN reaction causing immediate rash, facial/tongue/throat swelling, SOB or lightheadedness with hypotension: Yes Has patient had a PCN reaction causing severe rash involving mucus membranes or skin necrosis: Unknown Has patient had a PCN reaction that required hospitalization: Unknown Has patient had a PCN reaction occurring within the last 10 years: No If all of the above answers are "NO", then may proceed with Cephalosporin use.    Patient Measurements: Height: 5\' 6"  (167.6 cm) Weight: 246 lb (111.6 kg) IBW/kg (Calculated) : 59.3 Heparin Dosing Weight:   Vital Signs: Temp: 97.6 F (36.4 C) (06/28 0341) Temp Source: Oral (06/28 0341) BP: 127/61 (06/28 0341) Pulse Rate: 88 (06/28 0341)  Labs: Recent Labs    03/18/18 0607 03/19/18 0528 03/20/18 0538  HGB 9.3*  --   --   HCT 27.1*  --   --   PLT 237  --   --   LABPROT 26.8* 27.7* 26.2*  INR 2.50 2.61 2.43  CREATININE 1.01* 1.02*  --     Estimated Creatinine Clearance: 61.3 mL/min (A) (by C-G formula based on SCr of 1.02 mg/dL (H)).   Medical History: Past Medical History:  Diagnosis Date  . Cancer ([place])    Breast and lung  . Diabetes mellitus without complication ([place])   . GERD (gastroesophageal reflux disease)   . Heart disease   . Hypertension     Medications:  Infusions:    Assessment: 11 yof here with shock from pseudomonas on ciprofloxacin and cefepime from urinary source. Patient takes warfarin PTA but on admission INR was greater than 10 so she received 10 mg of IV vitamin K x 1 dose on 03/13/18. Outpatient her VKA dose was recently reduced from 28 mg/wk to 26 mg/wk due to elevated INR. Her albumin has notably decreased this month which is likely contributing to elevated INR,  along with sepsis. Pharmacy consulted to dose warfarin for AF.   DATE INR DOSE 6/21 >10 Held and given phytonadione 10 mg IV x 1 6/21  2.61 Held 6/22   Held 6/23  Held 6/24  Held 6/25 2.14 3 mg 6/26 2.5 2 mg 6/27 2.61 2 mg 6/28 2.43  Goal of Therapy:  INR 2-3 Monitor platelets by anticoagulation protocol: Yes   Plan:  Continue warfarin 2 mg po daily and continue to monitor INR daily while on antibiotics (DDI with ciprofloxacin)  Laural Benes, Pharm.D., BCPS Clinical Pharmacist 03/20/2018,7:30 AM

## 2018-03-20 NOTE — Discharge Summary (Addendum)
[facility] NAME: Tracy Dickerson    MR#:  [id number]  DATE OF BIRTH:  [birth year]  DATE OF ADMISSION:  03/13/2018 ADMITTING PHYSICIAN: Saundra Shelling, MD  DATE OF DISCHARGE: 03/20/2018  PRIMARY CARE PHYSICIAN: Boykin Nearing, MD    ADMISSION DIAGNOSIS:  Hyperkalemia [E87.5] Urinary retention [R33.9] Supratherapeutic INR [R79.1] Sepsis due to urinary tract infection ([place]) [A41.9, N39.0] Sepsis, due to unspecified organism [facility]) [A41.9] Acute kidney injury superimposed on chronic kidney disease ([place]) [N17.9, N18.9] Sepsis ([place]) [A41.9]  DISCHARGE DIAGNOSIS:  Active Problems:   Acute kidney injury superimposed on chronic kidney disease ([place])   Sepsis due to urinary tract infection ([place])   Hyperkalemia   Septic shock ([place])   SECONDARY DIAGNOSIS:   Past Medical History:  Diagnosis Date  . Cancer ([place])    Breast and lung  . Diabetes mellitus without complication ([place])   . GERD (gastroesophageal reflux disease)   . Heart disease   . Hypertension     HOSPITAL COURSE:   1.  Septic shock with Pseudomonas.  Acute cystitis with hematuria.  Patient recently had a Foley catheter and that was removed and needed to be replaced for urinary retention.  Patient required ICU stay and pressor support to maintain blood pressure.  Stress dose steroids were given and taper to off.  The patient received antibiotics while here.  For Pseudomonas sepsis course is 3 weeks worth of antibiotics.  The patient will need another 2 weeks of p.o. Cipro.  This course will end in 14 days. 2.  Fluid overload.  Patient was given a few doses of Lasix while here in the hospital.  Patient breathing better.  Will give oral Lasix upon going home. 3.  Epigastric abdominal pain and GERD.  Patient on Protonix and Pepcid while here.  Improved with Tums. 4.  Acute kidney injury on chronic kidney disease stage III.  Creatinine improved with IV fluids down to 1.02. 5.   Hyperlipidemia unspecified on atorvastatin 6.  Diabetic neuropathy on gabapentin 7.  GERD on Protonix, Pepcid and Tums as needed 8.  Restless leg syndrome on Mirapex 9.  Obesity weight loss needed. 10.  Atrial fibrillation.  Watch INR on a weekly basis.  Continue Coumadin. 11.  Anemia.  Patient was transfused a unit of packed red blood cells on a hemoglobin of 6.7.  Repeat hemoglobin up at 9.3.  Continue to monitor closely as outpatient. 12.  Supposed to get her cast off on July 3. 13.  Urinary retention follow-up with Dr. Bernardo Heater as outpatient.  If unable to get the urine catheter out it must be changed every 4 weeks.  Can consider CT scan of the abdomen pelvis if have difficulty getting the urinary catheter out. 14. Diabetes- check finger sticks qac and qhs.  Sugars have been good while here  Recommend checking INR on a weekly basis while on a hemoglobin  while on Cipro.  Cipro can increase the INR.   DISCHARGE CONDITIONS:   [place]  CONSULTS OBTAINED:  Treatment Team:  Cleon Gustin, MD Poggi, Marshall Cork, MD  DRUG ALLERGIES:   Allergies  Allergen Reactions  . Oxycodone   . Percocet [Oxycodone-Acetaminophen] Itching  . Penicillins Rash    Has patient had a PCN reaction causing immediate rash, facial/tongue/throat swelling, SOB or lightheadedness with hypotension: Yes Has patient had a PCN reaction causing severe rash involving mucus membranes or skin necrosis: Unknown Has patient had a PCN reaction that required  hospitalization: Unknown Has patient had a PCN reaction occurring within the last 10 years: No If all of the above answers are "NO", then may proceed with Cephalosporin use.    DISCHARGE MEDICATIONS:   Allergies as of 03/20/2018      Reactions   Oxycodone    Percocet [oxycodone-acetaminophen] Itching   Penicillins Rash   Has patient had a PCN reaction causing immediate rash, facial/tongue/throat swelling, SOB or lightheadedness with hypotension: Yes Has patient  had a PCN reaction causing severe rash involving mucus membranes or skin necrosis: Unknown Has patient had a PCN reaction that required hospitalization: Unknown Has patient had a PCN reaction occurring within the last 10 years: No If all of the above answers are "NO", then may proceed with Cephalosporin use.      Medication List    STOP taking these medications   benzonatate 100 MG capsule Commonly known as:  TESSALON PERLES   metFORMIN 500 MG tablet Commonly known as:  GLUCOPHAGE   metoprolol tartrate 25 MG tablet Commonly known as:  LOPRESSOR     TAKE these medications   acetaminophen 325 MG tablet Commonly known as:  TYLENOL Take 2 tablets (650 mg total) by mouth every 6 (six) hours as needed for mild pain (or Fever >/= 101).   ADVAIR DISKUS 250-50 MCG/DOSE Aepb Generic drug:  Fluticasone-Salmeterol Inhale 1 puff into the lungs 2 (two) times daily.   atorvastatin 80 MG tablet Commonly known as:  LIPITOR Take 80 mg by mouth daily.   bisacodyl 10 MG suppository Commonly known as:  DULCOLAX Place 1 suppository (10 mg total) rectally daily as needed for moderate constipation.   calcium carbonate 500 MG chewable tablet Commonly known as:  TUMS - dosed in mg elemental calcium Chew 1 tablet (200 mg of elemental calcium total) by mouth 4 (four) times daily as needed for indigestion or heartburn.   ciprofloxacin 500 MG tablet Commonly known as:  CIPRO Take 1 tablet (500 mg total) by mouth 2 (two) times daily for 14 days.   famotidine 20 MG tablet Commonly known as:  PEPCID Take 1 tablet (20 mg total) by mouth 2 (two) times daily.   ferrous sulfate 160 (50 Fe) MG Tbcr SR tablet Commonly known as:  SLOW FE Take 160 mg by mouth daily.   folic acid 1 MG tablet Commonly known as:  FOLVITE Take 1 tablet (1 mg total) by mouth daily.   furosemide 20 MG tablet Commonly known as:  LASIX Take 1 tablet (20 mg total) by mouth daily.   gabapentin 300 MG capsule Commonly known  as:  NEURONTIN Take 1 capsule (300 mg total) by mouth at bedtime.   magnesium oxide 400 (241.3 Mg) MG tablet Commonly known as:  MAG-OX Take 1 tablet (400 mg total) by mouth daily.   omeprazole 40 MG capsule Commonly known as:  PRILOSEC Take 40 mg by mouth daily.   polyethylene glycol packet Commonly known as:  MIRALAX / GLYCOLAX Take 17 g by mouth daily as needed for mild constipation.   pramipexole 0.25 MG tablet Commonly known as:  MIRAPEX Take 0.25 mg by mouth at bedtime.   PROAIR HFA 108 (90 Base) MCG/ACT inhaler Generic drug:  albuterol Inhale 2 puffs into the lungs every 4 (four) hours as needed.   SPIRIVA HANDIHALER 18 MCG inhalation capsule Generic drug:  tiotropium Place 1 capsule into inhaler and inhale daily.   traMADol 50 MG tablet Commonly known as:  ULTRAM Take 1 tablet (50 mg total)  by mouth every 6 (six) hours as needed for moderate pain or severe pain.   warfarin 2 MG tablet Commonly known as:  COUMADIN Take 1 tablet (2 mg total) by mouth daily at 6 PM. What changed:    medication strength  how much to take  how to take this  when to take this  additional instructions        DISCHARGE INSTRUCTIONS:    Follow up doctor at rehab one day Dr Bernardo Heater one week  If you experience worsening of your admission symptoms, develop shortness of breath, life threatening emergency, suicidal or homicidal thoughts you must seek medical attention immediately by calling 911 or calling your MD immediately  if symptoms less severe.  You Must read complete instructions/literature along with all the possible adverse reactions/side effects for all the Medicines you take and that have been prescribed to you. Take any new Medicines after you have completely understood and accept all the possible adverse reactions/side effects.   Please note  You were cared for by a hospitalist during your hospital stay. If you have any questions about your discharge medications or  the care you received while you were in the hospital after you are discharged, you can call the unit and asked to speak with the hospitalist on call if the hospitalist that took care of you is not available. Once you are discharged, your primary care physician will handle any further medical issues. Please note that NO REFILLS for any discharge medications will be authorized once you are discharged, as it is imperative that you return to your primary care physician (or establish a relationship with a primary care physician if you do not have one) for your aftercare needs so that they can reassess your need for medications and monitor your lab values.    Today   CHIEF COMPLAINT:   Chief Complaint  Patient presents with  . Weakness    HISTORY OF PRESENT ILLNESS:  Tracy Dickerson  is a 77 y.o. female  came in with weakness and found to have septic shock   VITAL SIGNS:  Blood pressure 127/61, pulse 88, temperature 97.6 F (36.4 C), temperature source Oral, resp. rate 18, height 5\' 6"  (1.676 m), weight 111.6 kg (246 lb), SpO2 95 %.   PHYSICAL EXAMINATION:  GENERAL:  77 y.o.-year-old patient lying in the bed with no acute distress.  EYES: Pupils equal, round, reactive to light and accommodation. No scleral icterus. Extraocular muscles intact.  HEENT: Head atraumatic, normocephalic. Oropharynx and nasopharynx clear.  NECK:  Supple, no jugular venous distention. No thyroid enlargement, no tenderness.  LUNGS: Normal breath sounds bilaterally, no wheezing, rales,rhonchi or crepitation. No use of accessory muscles of respiration.  CARDIOVASCULAR: S1, S2 normal. No murmurs, rubs, or gallops.  ABDOMEN: Soft, non-tender, non-distended. Bowel sounds present. No organomegaly or mass.  EXTREMITIES: Trace pedal edema, no cyanosis, or clubbing.  NEUROLOGIC: Cranial nerves II through XII are intact. Muscle strength 5/5 in all extremities. Sensation intact. Gait not checked.  PSYCHIATRIC: The patient is alert  and oriented x 3.  SKIN: No obvious rash, lesion, or ulcer.  Left leg in cast.  DATA REVIEW:   CBC Recent Labs  Lab 03/18/18 0607  WBC 10.8  HGB 9.3*  HCT 27.1*  PLT 237    Chemistries  Recent Labs  Lab 03/15/18 1002  03/19/18 0528  NA  --    < > 142  K  --    < > 3.4*  CL  --    < >  113*  CO2  --    < > 21*  GLUCOSE  --    < > 86  BUN  --    < > 47*  CREATININE  --    < > 1.02*  CALCIUM  --    < > 7.7*  MG  --   --  1.6*  AST 41  --   --   ALT 17  --   --   ALKPHOS 106  --   --   BILITOT 0.6  --   --    < > = values in this interval not displayed.    Cardiac Enzymes Recent Labs  Lab 03/14/18 0941  TROPONINI 0.03*    Microbiology Results  Results for orders placed or performed during the hospital encounter of 03/13/18  Blood Culture (routine x 2)     Status: Abnormal   Collection Time: 03/13/18  2:27 PM  Result Value Ref Range Status   Specimen Description   Final    BLOOD R HAND Performed at [facility], [address], [place], [place] [address]    Special Requests   Final    BOTTLES DRAWN AEROBIC AND ANAEROBIC Blood Culture adequate volume Performed at [facility], [address] [place], [place] [address]    Culture  Setup Time   Final    GRAM NEGATIVE RODS AEROBIC BOTTLE ONLY CRITICAL RESULT CALLED TO, READ BACK BY AND VERIFIED WITH: LISA KLUTTZ ON 03/14/18 AT 0850 QSD Performed at [facility], [address] [place], [place] [address]    Culture (A)  Final    PSEUDOMONAS AERUGINOSA SUSCEPTIBILITIES PERFORMED ON PREVIOUS CULTURE WITHIN THE LAST 5 DAYS. Performed at [facility], [place] [address], [place], [place] [address]    Report Status 03/16/2018 FINAL  Final  Blood Culture (routine x 2)     Status: Abnormal   Collection Time: 03/13/18  2:27 PM  Result Value Ref Range Status   Specimen Description   Final    BLOOD L HAND Performed at [facility], [address], [place], [place]  [address]    Special Requests   Final    BOTTLES DRAWN AEROBIC AND ANAEROBIC Blood Culture adequate volume Performed at [facility], [address] [place], [place] [address]    Culture  Setup Time   Final    Organism ID to follow GRAM NEGATIVE RODS AEROBIC BOTTLE ONLY CRITICAL RESULT CALLED TO, READ BACK BY AND VERIFIED WITH: LISA KLUTTZ ON 03/14/18 AT 0850 QSD Performed at [facility], [address] [place],  [address]    Culture PSEUDOMONAS AERUGINOSA (A)  Final   Report Status 03/16/2018 FINAL  Final   Organism ID, Bacteria PSEUDOMONAS AERUGINOSA  Final      Susceptibility   Pseudomonas aeruginosa - MIC*    CEFTAZIDIME 4 SENSITIVE Sensitive     CIPROFLOXACIN <=0.25 SENSITIVE Sensitive     GENTAMICIN <=1 SENSITIVE Sensitive     IMIPENEM 1 SENSITIVE Sensitive     PIP/TAZO 8 SENSITIVE Sensitive     CEFEPIME 2 SENSITIVE Sensitive     * PSEUDOMONAS AERUGINOSA  Blood Culture ID Panel (Reflexed)     Status: Abnormal   Collection Time: 03/13/18  2:27 PM  Result Value Ref Range Status   Enterococcus species NOT DETECTED NOT DETECTED Final   Listeria monocytogenes NOT DETECTED NOT DETECTED Final   Staphylococcus species NOT DETECTED NOT DETECTED Final   Staphylococcus aureus NOT  DETECTED NOT DETECTED Final   Streptococcus species NOT DETECTED NOT DETECTED Final   Streptococcus agalactiae NOT DETECTED NOT DETECTED Final   Streptococcus pneumoniae NOT DETECTED NOT DETECTED Final   Streptococcus pyogenes NOT DETECTED NOT DETECTED Final   Acinetobacter baumannii NOT DETECTED NOT DETECTED Final   Enterobacteriaceae species NOT DETECTED NOT DETECTED Final   Enterobacter cloacae complex NOT DETECTED NOT DETECTED Final   Escherichia coli NOT DETECTED NOT DETECTED Final   Klebsiella oxytoca NOT DETECTED NOT DETECTED Final   Klebsiella pneumoniae NOT DETECTED NOT DETECTED Final   Proteus species NOT DETECTED NOT DETECTED Final   Serratia marcescens NOT DETECTED  NOT DETECTED Final   Carbapenem resistance NOT DETECTED NOT DETECTED Final   Haemophilus influenzae NOT DETECTED NOT DETECTED Final   Neisseria meningitidis NOT DETECTED NOT DETECTED Final   Pseudomonas aeruginosa DETECTED (A) NOT DETECTED Final    Comment: CRITICAL RESULT CALLED TO, READ BACK BY AND VERIFIED WITH: LISA KLUTTZ ON 03/14/18 AT 0850 QSD    Candida albicans NOT DETECTED NOT DETECTED Final   Candida glabrata NOT DETECTED NOT DETECTED Final   Candida krusei NOT DETECTED NOT DETECTED Final   Candida parapsilosis NOT DETECTED NOT DETECTED Final   Candida tropicalis NOT DETECTED NOT DETECTED Final    Comment: Performed at [facility], [address] [place], [place] [address]  Urine culture     Status: Abnormal   Collection Time: 03/13/18  2:40 PM  Result Value Ref Range Status   Specimen Description   Final    URINE, RANDOM Performed at [facility], [address] [place], [place] [address]    Special Requests   Final    NONE Performed at [facility], [address], [place], [place] [address]    Culture (A)  Final    >=100,000 COLONIES/mL PSEUDOMONAS AERUGINOSA 80,000 COLONIES/mL ESCHERICHIA COLI    Report Status 03/17/2018 FINAL  Final   Organism ID, Bacteria PSEUDOMONAS AERUGINOSA (A)  Final   Organism ID, Bacteria ESCHERICHIA COLI (A)  Final      Susceptibility   Escherichia coli - MIC*    AMPICILLIN 8 SENSITIVE Sensitive     CEFAZOLIN <=4 SENSITIVE Sensitive     CEFTRIAXONE <=1 SENSITIVE Sensitive     CIPROFLOXACIN <=0.25 SENSITIVE Sensitive     GENTAMICIN <=1 SENSITIVE Sensitive     IMIPENEM <=0.25 SENSITIVE Sensitive     NITROFURANTOIN <=16 SENSITIVE Sensitive     TRIMETH/SULFA <=20 SENSITIVE Sensitive     AMPICILLIN/SULBACTAM 8 SENSITIVE Sensitive     PIP/TAZO <=4 SENSITIVE Sensitive     Extended ESBL NEGATIVE Sensitive     * 80,000 COLONIES/mL ESCHERICHIA COLI   Pseudomonas aeruginosa - MIC*    CEFTAZIDIME 4 SENSITIVE  Sensitive     CIPROFLOXACIN <=0.25 SENSITIVE Sensitive     GENTAMICIN 2 SENSITIVE Sensitive     IMIPENEM 1 SENSITIVE Sensitive     PIP/TAZO 8 SENSITIVE Sensitive     CEFEPIME 2 SENSITIVE Sensitive     * >=100,000 COLONIES/mL PSEUDOMONAS AERUGINOSA  MRSA PCR Screening     Status: None   Collection Time: 03/13/18  9:27 PM  Result Value Ref Range Status   MRSA by PCR NEGATIVE NEGATIVE Final    Comment:        The GeneXpert MRSA Assay (FDA approved for NASAL specimens only), is one component of a comprehensive MRSA colonization surveillance program. It is not intended to diagnose MRSA infection nor to guide or monitor treatment for  MRSA infections. Performed at [facility], [address] [place], [place] [address]   CULTURE, BLOOD (ROUTINE X 2) w Reflex to ID Panel     Status: None   Collection Time: 03/15/18  9:32 PM  Result Value Ref Range Status   Specimen Description BLOOD RIGHT HAND  Final   Special Requests   Final    BOTTLES DRAWN AEROBIC AND ANAEROBIC Blood Culture adequate volume   Culture   Final    NO GROWTH 5 DAYS Performed at [facility], [address] [place], [place] [address]    Report Status 03/20/2018 FINAL  Final  CULTURE, BLOOD (ROUTINE X 2) w Reflex to ID Panel     Status: None   Collection Time: 03/15/18  9:41 PM  Result Value Ref Range Status   Specimen Description BLOOD BLOOD LEFT ARM  Final   Special Requests   Final    BOTTLES DRAWN AEROBIC AND ANAEROBIC Blood Culture adequate volume   Culture   Final    NO GROWTH 5 DAYS Performed at [facility], [address] [place], [place] [address]    Report Status 03/20/2018 FINAL  Final    RADIOLOGY:  No results found.  EKG:   Orders placed or performed during the hospital encounter of 03/13/18  . ED EKG 12-Lead  . ED EKG 12-Lead  . EKG 12-Lead  . EKG 12-Lead      Management plans discussed with the patient, and she is in agreement.  CODE STATUS:      Code Status Orders  (From admission, onward)        Start     Ordered   03/13/18 2021  Full code  Continuous     03/13/18 2020    Code Status History    Date Active Date Inactive Code Status Order ID Comments User Context   02/13/2018 2026 02/16/2018 1637 Full Code [id number]  Gorden Harms, MD Inpatient   02/02/2018 2159 02/03/2018 2049 Full Code [id number]  Corky Mull, MD Inpatient   10/29/2017 1816 10/31/2017 1816 DNR [id number]  Hillary Bow, MD ED   01/26/2017 2250 01/27/2017 1427 Full Code [id number]  Henreitta Leber, MD ED      TOTAL TIME TAKING CARE OF THIS PATIENT: 35 minutes.    Loletha Grayer M.D on 03/20/2018 at 8:53 AM  Between 7am to 6pm - Pager - [phone]  After 6pm go to [email or web address] - password [email or web address] [facility]  [phone]  CC: Primary care physician; Boykin Nearing, MD

## 2018-03-20 NOTE — Progress Notes (Signed)
Patient is being discharged today to [facility], report called to receiving nurse Levada Dy ,RN , Patient picc line removed, foley remains in place as per order, no distress noted. EMS called waiting for transports

## 2018-05-05 ENCOUNTER — Ambulatory Visit: Payer: Medicare Other

## 2018-05-05 ENCOUNTER — Encounter: Payer: Self-pay | Admitting: Urology

## 2018-05-12 ENCOUNTER — Telehealth: Payer: Self-pay | Admitting: Urology

## 2018-05-12 NOTE — Telephone Encounter (Signed)
Patient called lm on VM I tried calling back NO answer  Tracy Dickerson

## 2018-08-22 ENCOUNTER — Emergency Department: Payer: Medicare Other

## 2018-08-22 ENCOUNTER — Encounter: Payer: Self-pay | Admitting: Emergency Medicine

## 2018-08-22 ENCOUNTER — Emergency Department
Admission: EM | Admit: 2018-08-22 | Discharge: 2018-08-22 | Disposition: A | Discharge status: Home / Self Care | Payer: Medicare Other | Attending: Emergency Medicine | Admitting: Emergency Medicine

## 2018-08-22 ENCOUNTER — Other Ambulatory Visit: Payer: Self-pay

## 2018-08-22 DIAGNOSIS — Y9389 Activity, other specified: Secondary | ICD-10-CM | POA: Diagnosis not present

## 2018-08-22 DIAGNOSIS — S20212A Contusion of left front wall of thorax, initial encounter: Secondary | ICD-10-CM | POA: Diagnosis not present

## 2018-08-22 DIAGNOSIS — W19XXXA Unspecified fall, initial encounter: Secondary | ICD-10-CM | POA: Insufficient documentation

## 2018-08-22 DIAGNOSIS — Z85118 Personal history of other malignant neoplasm of bronchus and lung: Secondary | ICD-10-CM | POA: Insufficient documentation

## 2018-08-22 DIAGNOSIS — E1122 Type 2 diabetes mellitus with diabetic chronic kidney disease: Secondary | ICD-10-CM | POA: Diagnosis not present

## 2018-08-22 DIAGNOSIS — Y92018 Other place in single-family (private) house as the place of occurrence of the external cause: Secondary | ICD-10-CM | POA: Diagnosis not present

## 2018-08-22 DIAGNOSIS — Y998 Other external cause status: Secondary | ICD-10-CM | POA: Diagnosis not present

## 2018-08-22 DIAGNOSIS — Z853 Personal history of malignant neoplasm of breast: Secondary | ICD-10-CM | POA: Insufficient documentation

## 2018-08-22 DIAGNOSIS — Z79899 Other long term (current) drug therapy: Secondary | ICD-10-CM | POA: Diagnosis not present

## 2018-08-22 DIAGNOSIS — Z87891 Personal history of nicotine dependence: Secondary | ICD-10-CM | POA: Insufficient documentation

## 2018-08-22 DIAGNOSIS — N189 Chronic kidney disease, unspecified: Secondary | ICD-10-CM | POA: Diagnosis not present

## 2018-08-22 DIAGNOSIS — N3001 Acute cystitis with hematuria: Secondary | ICD-10-CM | POA: Diagnosis not present

## 2018-08-22 DIAGNOSIS — S299XXA Unspecified injury of thorax, initial encounter: Secondary | ICD-10-CM | POA: Diagnosis present

## 2018-08-22 DIAGNOSIS — Z7901 Long term (current) use of anticoagulants: Secondary | ICD-10-CM | POA: Insufficient documentation

## 2018-08-22 LAB — CBC
HCT: 35.8 % — ABNORMAL LOW (ref 36.0–46.0)
Hemoglobin: 11.2 g/dL — ABNORMAL LOW (ref 12.0–15.0)
MCH: 28 pg (ref 26.0–34.0)
MCHC: 31.3 g/dL (ref 30.0–36.0)
MCV: 89.5 fL (ref 80.0–100.0)
Platelets: 208 10*3/uL (ref 150–400)
RBC: 4 MIL/uL (ref 3.87–5.11)
RDW: 13.9 % (ref 11.5–15.5)
WBC: 7.6 10*3/uL (ref 4.0–10.5)
nRBC: 0 % (ref 0.0–0.2)

## 2018-08-22 LAB — PROTIME-INR
INR: 1.1
Prothrombin Time: 14.1 seconds (ref 11.4–15.2)

## 2018-08-22 LAB — URINALYSIS, COMPLETE (UACMP) WITH MICROSCOPIC
Bilirubin Urine: NEGATIVE
Glucose, UA: NEGATIVE mg/dL
Ketones, ur: NEGATIVE mg/dL
Nitrite: NEGATIVE
Protein, ur: 30 mg/dL — AB
Specific Gravity, Urine: 1.012 (ref 1.005–1.030)
WBC, UA: >50 WBC/hpf — ABNORMAL HIGH (ref 0–5)
pH: 6 (ref 5.0–8.0)

## 2018-08-22 LAB — COMPREHENSIVE METABOLIC PANEL
ALT: 11 U/L (ref 0–44)
AST: 17 U/L (ref 15–41)
Albumin: 3.6 g/dL (ref 3.5–5.0)
Alkaline Phosphatase: 78 U/L (ref 38–126)
Anion gap: 10 (ref 5–15)
BUN: 20 mg/dL (ref 8–23)
CO2: 28 mmol/L (ref 22–32)
Calcium: 9.1 mg/dL (ref 8.9–10.3)
Chloride: 104 mmol/L (ref 98–111)
Creatinine, Ser: 0.84 mg/dL (ref 0.44–1.00)
GFR calc Af Amer: >60 mL/min (ref >60)
GFR calc non Af Amer: >60 mL/min (ref >60)
Glucose, Bld: 117 mg/dL — ABNORMAL HIGH (ref 70–99)
Potassium: 3.7 mmol/L (ref 3.5–5.1)
Sodium: 142 mmol/L (ref 135–145)
Total Bilirubin: 0.7 mg/dL (ref 0.3–1.2)
Total Protein: 7.2 g/dL (ref 6.5–8.1)

## 2018-08-22 MED ORDER — SULFAMETHOXAZOLE-TRIMETHOPRIM 800-160 MG PO TABS
1.0000 | ORAL_TABLET | Freq: Once | ORAL | Status: AC
  Administered 2018-08-22: 1 via ORAL
  Filled 2018-08-22: qty 1

## 2018-08-22 MED ORDER — TRAMADOL HCL 50 MG PO TABS
50.0000 mg | ORAL_TABLET | Freq: Once | ORAL | Status: AC
  Administered 2018-08-22: 50 mg via ORAL
  Filled 2018-08-22: qty 1

## 2018-08-22 MED ORDER — IOPAMIDOL (ISOVUE-300) INJECTION 61%
100.0000 mL | Freq: Once | INTRAVENOUS | Status: AC | PRN
  Administered 2018-08-22: 100 mL via INTRAVENOUS
  Filled 2018-08-22: qty 100

## 2018-08-22 MED ORDER — SULFAMETHOXAZOLE-TRIMETHOPRIM 800-160 MG PO TABS: 1.0000 | ORAL_TABLET | Freq: Two times a day (BID) | ORAL | 0 refills | Status: DC

## 2018-08-22 NOTE — ED Provider Notes (Signed)
[facility] Emergency Department Provider Note  ____________________________________________  Time seen: Approximately 10:52 AM  I have reviewed the triage vital signs and the nursing notes.   HISTORY  Chief Complaint Rib Injury    HPI Tracy Dickerson is a 77 y.o. female with past medical history of lung and breast cancer currently in remission, anemia, hypertension, diabetes, afib that take eliquis that presents the emergency department for evaluation of fall on Tuesday and possible vaginal bleeding on Wednesday.  Patient states that her left leg gave out on her and she hit her left rib cage on the TV stand on Tuesday.  She denies any additional injuries.  On Wednesday, while using the bathroom, she noticed bright red blood on the toilet paper.  She did not notice any blood in the toilet.  She thinks that the blood came from her vagina and does not think that the blood came from her rectum or urine.  She has had a hysterectomy.  She has not noticed any blood since Wednesday.  She is concerned that she broke a left rib.  No fever, shortness of breath, chest pain, abdominal pain, back pain.  Past Medical History:  Diagnosis Date  . Cancer ([place])    Breast and lung  . Diabetes mellitus without complication ([place])   . GERD (gastroesophageal reflux disease)   . Heart disease   . Hypertension     Patient Active Problem List   Diagnosis Date Noted  . Hyperkalemia   . Septic shock ([place])   . Sepsis due to urinary tract infection ([place]) 03/13/2018  . Acute kidney injury superimposed on chronic kidney disease ([place]) 02/13/2018  . Closed extra-articular fracture of distal tibia 02/02/2018  . Pneumonia 10/29/2017  . Hypotension 01/26/2017    Past Surgical History:  Procedure Laterality Date  . ABDOMINAL HYSTERECTOMY    . APPENDECTOMY    . BREAST SURGERY     breast cancer LEFT  . OPEN REDUCTION INTERNAL FIXATION (ORIF) TIBIA/FIBULA FRACTURE Left 02/03/2018    Procedure: OPEN REDUCTION INTERNAL FIXATION (ORIF) DISTAL TIBIA FRACTURE;  Surgeon: Corky Mull, MD;  Location: [facility] ORS;  Service: Orthopedics;  Laterality: Left;  ankle/lower leg  . TONSILLECTOMY      Prior to Admission medications   Medication Sig Start Date End Date Taking? Authorizing Provider  acetaminophen (TYLENOL) 325 MG tablet Take 2 tablets (650 mg total) by mouth every 6 (six) hours as needed for mild pain (or Fever >/= 101). 03/20/18   Loletha Grayer, MD  ADVAIR DISKUS 250-50 MCG/DOSE AEPB Inhale 1 puff into the lungs 2 (two) times daily. 12/14/16   [provider]  albuterol (PROAIR HFA) 108 (90 Base) MCG/ACT inhaler Inhale 2 puffs into the lungs every 4 (four) hours as needed. 04/08/17   [provider]  atorvastatin (LIPITOR) 80 MG tablet Take 80 mg by mouth daily.    [provider]  bisacodyl (DULCOLAX) 10 MG suppository Place 1 suppository (10 mg total) rectally daily as needed for moderate constipation. 02/16/18   [name], MD  calcium carbonate (TUMS - DOSED IN MG ELEMENTAL CALCIUM) 500 MG chewable tablet Chew 1 tablet (200 mg of elemental calcium total) by mouth 4 (four) times daily as needed for indigestion or heartburn. 03/20/18   Loletha Grayer, MD  famotidine (PEPCID) 20 MG tablet Take 1 tablet (20 mg total) by mouth 2 (two) times daily. 03/20/18   Wieting, Richard, MD  ferrous sulfate (SLOW FE) 160 (50 Fe) MG TBCR SR  tablet Take 160 mg by mouth daily.  04/18/17   [provider]  folic acid (FOLVITE) 1 MG tablet Take 1 tablet (1 mg total) by mouth daily. 03/20/18   Loletha Grayer, MD  furosemide (LASIX) 20 MG tablet Take 1 tablet (20 mg total) by mouth daily. 03/20/18   Loletha Grayer, MD  gabapentin (NEURONTIN) 300 MG capsule Take 1 capsule (300 mg total) by mouth at bedtime. 11/24/17   Nance Pear, MD  magnesium oxide (MAG-OX) 400 (241.3 Mg) MG tablet Take 1 tablet (400 mg total) by mouth daily. 03/20/18   Loletha Grayer, MD   omeprazole (PRILOSEC) 40 MG capsule Take 40 mg by mouth daily. 01/15/17   [provider]  polyethylene glycol (MIRALAX / GLYCOLAX) packet Take 17 g by mouth daily as needed for mild constipation. 02/16/18   [name], MD  pramipexole (MIRAPEX) 0.25 MG tablet Take 0.25 mg by mouth at bedtime. 12/23/16   [provider]  SPIRIVA HANDIHALER 18 MCG inhalation capsule Place 1 capsule into inhaler and inhale daily. 01/26/18   [provider]  sulfamethoxazole-trimethoprim (BACTRIM DS,SEPTRA DS) 800-160 MG tablet Take 1 tablet by mouth 2 (two) times daily. 08/22/18   Laban Emperor, PA-C  traMADol (ULTRAM) 50 MG tablet Take 1 tablet (50 mg total) by mouth every 6 (six) hours as needed for moderate pain or severe pain. 03/20/18   Loletha Grayer, MD  warfarin (COUMADIN) 2 MG tablet Take 1 tablet (2 mg total) by mouth daily at 6 PM. 03/20/18   Loletha Grayer, MD    Allergies Oxycodone; Percocet [oxycodone-acetaminophen]; and Penicillins  Family History  Problem Relation Age of Onset  . Cancer Mother     Social History Social History   Tobacco Use  . Smoking status: Former Research scientist (life sciences)  . Smokeless tobacco: Never Used  Substance Use Topics  . Alcohol use: Yes  . Drug use: No     Review of Systems  Cardiovascular: No chest pain. Respiratory:  No SOB. Gastrointestinal: No abdominal pain.  No nausea, no vomiting.  Genitourinary: Negative for dysuria. Musculoskeletal: Positive for rib pain. Skin: Negative for rash, abrasions, lacerations.  Positive for ecchymosis. Neurological: Negative for headaches, numbness or tingling   ____________________________________________   PHYSICAL EXAM:  VITAL SIGNS: ED Triage Vitals  Enc Vitals Group     BP 08/22/18 0934 (!) 146/71     Pulse Rate 08/22/18 0934 77     Resp 08/22/18 0934 20     Temp 08/22/18 0934 98 F (36.7 C)     Temp Source 08/22/18 0934 Oral     SpO2 08/22/18 0934 97 %     Weight 08/22/18 0935 235 lb  (106.6 kg)     Height 08/22/18 0935 5\' 6"  (1.676 m)     Head Circumference --      Peak Flow --      Pain Score 08/22/18 0935 9     Pain Loc --      Pain Edu? --      Excl. in [place]? --      Constitutional: Alert and oriented. Well appearing and in no acute distress. Eyes: Conjunctivae are normal. PERRL. EOMI. Head: Atraumatic. ENT:      Ears:      Nose: No congestion/rhinnorhea.      Mouth/Throat: Mucous membranes are moist.  Neck: No stridor.   Cardiovascular: Normal rate, regular rhythm.  Good peripheral circulation. Respiratory: Normal respiratory effort without tachypnea or retractions. Lungs CTAB. Good air entry to the  bases with no decreased or absent breath sounds. Gastrointestinal: Bowel sounds 4 quadrants. Soft and nontender to palpation. No guarding or rigidity. No palpable masses. No distention. Musculoskeletal: Full range of motion to all extremities. No gross deformities appreciated.   Neurologic:  Normal speech and language. No gross focal neurologic deficits are appreciated.  Skin:  Skin is warm, dry and intact. No rash noted. Ecchymosis to left lateral rib cage. Psychiatric: Mood and affect are normal. Speech and behavior are normal. Patient exhibits appropriate insight and judgement.   ____________________________________________   LABS (all labs ordered are listed, but only abnormal results are displayed)  Labs Reviewed  URINALYSIS, COMPLETE (UACMP) WITH MICROSCOPIC - Abnormal; Notable for the following components:      Result Value   Color, Urine YELLOW (*)    APPearance TURBID (*)    Hgb urine dipstick SMALL (*)    Protein, ur 30 (*)    Leukocytes, UA MODERATE (*)    WBC, UA >50 (*)    Bacteria, UA MANY (*)    All other components within normal limits  CBC - Abnormal; Notable for the following components:   Hemoglobin 11.2 (*)    HCT 35.8 (*)    All other components within normal limits  COMPREHENSIVE METABOLIC PANEL - Abnormal; Notable for the  following components:   Glucose, Bld 117 (*)    All other components within normal limits  URINE CULTURE  PROTIME-INR   ____________________________________________  EKG   ____________________________________________  RADIOLOGY Robinette Haines, personally viewed and evaluated these images (plain radiographs) as part of my medical decision making, as well as reviewing the written report by the radiologist.  Dg Ribs Unilateral W/chest Left  Result Date: 08/22/2018 CLINICAL DATA:  [place] 3 days ago, pain at BB marker, left lower posterior thorax. Tender left lower thorax area with bruising. Hx of breast CA and lung CA 1990. EXAM: LEFT RIBS AND CHEST - 3+ VIEW COMPARISON:  03/17/2018 FINDINGS: Chronic diffuse interstitial prominence. Scarring/atelectasis peripherally in the right lower lung. Surgical clips in the left axilla. Heart size upper limits normal. No displaced rib fracture. No pneumothorax. Spondylitic changes in the lower cervical and lower thoracic spine. DJD in bilateral shoulders. IMPRESSION: 1. Negative for displaced rib fracture or pneumothorax. Electronically Signed   By: Lucrezia Europe M.D.   On: 08/22/2018 10:22   Ct Chest W Contrast  Result Date: 08/22/2018 CLINICAL DATA:  Recent fall, continued left chest and abdominal pain. EXAM: CT CHEST, ABDOMEN, AND PELVIS WITH CONTRAST TECHNIQUE: Multidetector CT imaging of the chest, abdomen and pelvis was performed following the standard protocol during bolus administration of intravenous contrast. CONTRAST:  19mL ISOVUE-300 IOPAMIDOL (ISOVUE-300) INJECTION 61% COMPARISON:  10/29/2017 FINDINGS: CT CHEST FINDINGS Cardiovascular: Atherosclerosis of the aorta. No significant aneurysm or dissection. No mediastinal hemorrhage or hematoma. Major branch vessels are also atherosclerotic and tortuous but remain patent. Mild cardiomegaly.  No pericardial pleural effusion. Mediastinum/Nodes: Prominent but nonenlarged prevascular, paratracheal,  subcarinal lymph nodes noted. No bulky adenopathy. Esophagus is nondilated. No hiatal hernia. Trachea central airways appear patent. Visualized thyroid unremarkable. Lungs/Pleura: Chronic scattered peripheral interstitial fibrosis pattern, similar to the prior study. Minor basilar atelectasis/scarring. No superimposed edema, collapse, consolidation, definite pneumonia, or airspace process. No other pleural abnormality, significant effusion or pneumothorax. Musculoskeletal: No definite displaced rib fracture or focal rib abnormality. No chest wall soft tissue asymmetry or hematoma. Postop changes in the left axilla and breast. Degenerative changes noted of the spine. Intact thoracic spine. Sternum intact as  well. CT ABDOMEN PELVIS FINDINGS Hepatobiliary: Anterior right hepatic dome 7 mm hypodensity, image 34 series 2 remains indeterminate. No other significant hepatic abnormality or biliary dilatation. Gallbladder and biliary system unremarkable. Common bile duct nondilated. Hepatic and portal veins are patent. Pancreas: Mild fatty replacement of the pancreatic head. No ductal dilatation or surrounding inflammatory process. No fluid collection. Spleen: No splenic injury or perisplenic hematoma. Adrenals/Urinary Tract: Normal adrenal glands. Bilateral renal cysts noted, largest on the right kidney lower pole posteriorly measuring 6.9 cm. No renal obstruction or hydronephrosis. No obstructing ureteral calculus or definite bladder abnormality. Stomach/Bowel: Negative for bowel obstruction, significant dilatation, ileus, or free air. Appendix not visualized but no acute inflammatory process in the right lower quadrant. Scattered colonic diverticulosis without acute inflammatory process. No fluid collection, abscess, ascites, hemorrhage, or hematoma. Vascular/Lymphatic: Aortoiliac atherosclerosis noted. Negative for aneurysm or occlusive disease. Mesenteric and renal vasculature remain patent. No veno-occlusive disease.  No bulky adenopathy. Reproductive: Remote hysterectomy. No adnexal abnormality. No pelvic free fluid, hemorrhage, hematoma, or ascites. Other: No abdominal wall hernia or abnormality. No abdominopelvic ascites. Musculoskeletal: Bones are osteopenic. Degenerative changes noted. No acute osseous finding. IMPRESSION: No acute intrathoracic, abdominal, or pelvic finding. Aortic atherosclerosis without acute vascular finding Chronic appearing peripheral interstitial pulmonary fibrosis without superimposed acute chest process Bilateral renal cysts Diverticulosis without acute inflammatory process Remote hysterectomy No acute osseous finding or displaced fracture Electronically Signed   By: Jerilynn Mages.  Shick M.D.   On: 08/22/2018 13:23   Ct Abdomen Pelvis W Contrast  Result Date: 08/22/2018 CLINICAL DATA:  Recent fall, continued left chest and abdominal pain. EXAM: CT CHEST, ABDOMEN, AND PELVIS WITH CONTRAST TECHNIQUE: Multidetector CT imaging of the chest, abdomen and pelvis was performed following the standard protocol during bolus administration of intravenous contrast. CONTRAST:  151mL ISOVUE-300 IOPAMIDOL (ISOVUE-300) INJECTION 61% COMPARISON:  10/29/2017 FINDINGS: CT CHEST FINDINGS Cardiovascular: Atherosclerosis of the aorta. No significant aneurysm or dissection. No mediastinal hemorrhage or hematoma. Major branch vessels are also atherosclerotic and tortuous but remain patent. Mild cardiomegaly.  No pericardial pleural effusion. Mediastinum/Nodes: Prominent but nonenlarged prevascular, paratracheal, subcarinal lymph nodes noted. No bulky adenopathy. Esophagus is nondilated. No hiatal hernia. Trachea central airways appear patent. Visualized thyroid unremarkable. Lungs/Pleura: Chronic scattered peripheral interstitial fibrosis pattern, similar to the prior study. Minor basilar atelectasis/scarring. No superimposed edema, collapse, consolidation, definite pneumonia, or airspace process. No other pleural abnormality,  significant effusion or pneumothorax. Musculoskeletal: No definite displaced rib fracture or focal rib abnormality. No chest wall soft tissue asymmetry or hematoma. Postop changes in the left axilla and breast. Degenerative changes noted of the spine. Intact thoracic spine. Sternum intact as well. CT ABDOMEN PELVIS FINDINGS Hepatobiliary: Anterior right hepatic dome 7 mm hypodensity, image 34 series 2 remains indeterminate. No other significant hepatic abnormality or biliary dilatation. Gallbladder and biliary system unremarkable. Common bile duct nondilated. Hepatic and portal veins are patent. Pancreas: Mild fatty replacement of the pancreatic head. No ductal dilatation or surrounding inflammatory process. No fluid collection. Spleen: No splenic injury or perisplenic hematoma. Adrenals/Urinary Tract: Normal adrenal glands. Bilateral renal cysts noted, largest on the right kidney lower pole posteriorly measuring 6.9 cm. No renal obstruction or hydronephrosis. No obstructing ureteral calculus or definite bladder abnormality. Stomach/Bowel: Negative for bowel obstruction, significant dilatation, ileus, or free air. Appendix not visualized but no acute inflammatory process in the right lower quadrant. Scattered colonic diverticulosis without acute inflammatory process. No fluid collection, abscess, ascites, hemorrhage, or hematoma. Vascular/Lymphatic: Aortoiliac atherosclerosis noted. Negative for aneurysm or occlusive  disease. Mesenteric and renal vasculature remain patent. No veno-occlusive disease. No bulky adenopathy. Reproductive: Remote hysterectomy. No adnexal abnormality. No pelvic free fluid, hemorrhage, hematoma, or ascites. Other: No abdominal wall hernia or abnormality. No abdominopelvic ascites. Musculoskeletal: Bones are osteopenic. Degenerative changes noted. No acute osseous finding. IMPRESSION: No acute intrathoracic, abdominal, or pelvic finding. Aortic atherosclerosis without acute vascular finding  Chronic appearing peripheral interstitial pulmonary fibrosis without superimposed acute chest process Bilateral renal cysts Diverticulosis without acute inflammatory process Remote hysterectomy No acute osseous finding or displaced fracture Electronically Signed   By: Jerilynn Mages.  Shick M.D.   On: 08/22/2018 13:23    ____________________________________________    PROCEDURES  Procedure(s) performed:    Procedures    Medications  iopamidol (ISOVUE-300) 61 % injection 100 mL (100 mLs Intravenous Contrast Given 08/22/18 1246)  traMADol (ULTRAM) tablet 50 mg (50 mg Oral Given 08/22/18 1229)  sulfamethoxazole-trimethoprim (BACTRIM DS,SEPTRA DS) 800-160 MG per tablet 1 tablet (1 tablet Oral Given 08/22/18 1413)     ____________________________________________   INITIAL IMPRESSION / ASSESSMENT AND PLAN / ED COURSE  Pertinent labs & imaging results that were available during my care of the patient were reviewed by me and considered in my medical decision making (see chart for details).  Review of the [place] CSRS was performed in accordance of the [place] prior to dispensing any controlled drugs.   Patient presented to the emergency department for evaluation of fall 5 days ago and possible vaginal bleeding 4 days ago.  Vital signs and exam are reassuring.  X-ray negative for rib fracture.  Lab work, urine, CT chest abdomen and pelvis were ordered to evaluate because of the bleeding the next day.  Plan was discussed with Dr. Kerman Passey who is agreeable with plan.  Urinalysis indicates urinary tract infection.  Blood that patient saw on toilet paper 4 days ago was likely from urinary tract infection.  CT chest abdomen and pelvis show no acute processes.  Patient will be discharged home with prescriptions for Bactrim.  Patient is to follow up with primary care as directed. Patient is given ED precautions to return to the ED for any worsening or new  symptoms.     ____________________________________________  FINAL CLINICAL IMPRESSION(S) / ED DIAGNOSES  Final diagnoses:  Acute cystitis with hematuria  Fall, initial encounter  Contusion of rib on left side, initial encounter      NEW MEDICATIONS STARTED DURING THIS VISIT:  ED Discharge Orders         Ordered    sulfamethoxazole-trimethoprim (BACTRIM DS,SEPTRA DS) 800-160 MG tablet  2 times daily     08/22/18 1432              This chart was dictated using voice recognition software/Dragon. Despite best efforts to proofread, errors can occur which can change the meaning. Any change was purely unintentional.    Laban Emperor, PA-C 08/22/18 1522    Harvest Dark, MD 08/22/18 1524

## 2018-08-22 NOTE — ED Notes (Signed)
Iv team consult placed - pt possibly needs chest ct.

## 2018-08-22 NOTE — ED Triage Notes (Addendum)
States tripped and fell against TV set 4 days ago. Painful and concerned she may have broken a rib. No respiratory distress.

## 2018-08-22 NOTE — ED Notes (Signed)
See triage note    States she had a fall about 4 days ago  Hit the TV    conts to have pain to left side of chest/rib area  resp even and non labored

## 2018-08-22 NOTE — ED Notes (Signed)
Informed the provider that she had some possible vaginal bleeding starting the next day

## 2018-08-23 LAB — URINE CULTURE

## 2018-12-12 ENCOUNTER — Emergency Department
Admission: EM | Admit: 2018-12-12 | Discharge: 2018-12-12 | Disposition: A | Discharge status: Home / Self Care | Payer: Medicare Other | Attending: Emergency Medicine | Admitting: Emergency Medicine

## 2018-12-12 ENCOUNTER — Encounter: Payer: Self-pay | Admitting: Emergency Medicine

## 2018-12-12 ENCOUNTER — Emergency Department: Payer: Medicare Other

## 2018-12-12 DIAGNOSIS — E119 Type 2 diabetes mellitus without complications: Secondary | ICD-10-CM | POA: Insufficient documentation

## 2018-12-12 DIAGNOSIS — Z85118 Personal history of other malignant neoplasm of bronchus and lung: Secondary | ICD-10-CM | POA: Insufficient documentation

## 2018-12-12 DIAGNOSIS — I10 Essential (primary) hypertension: Secondary | ICD-10-CM | POA: Insufficient documentation

## 2018-12-12 DIAGNOSIS — R1012 Left upper quadrant pain: Secondary | ICD-10-CM | POA: Insufficient documentation

## 2018-12-12 DIAGNOSIS — Z853 Personal history of malignant neoplasm of breast: Secondary | ICD-10-CM | POA: Diagnosis not present

## 2018-12-12 LAB — TROPONIN I: Troponin I: <0.03 ng/mL (ref <0.03)

## 2018-12-12 LAB — CBC WITH DIFFERENTIAL/PLATELET
Abs Immature Granulocytes: 0.03 10*3/uL (ref 0.00–0.07)
Basophils Absolute: 0 10*3/uL (ref 0.0–0.1)
Basophils Relative: 1 %
Eosinophils Absolute: 0.2 10*3/uL (ref 0.0–0.5)
Eosinophils Relative: 3 %
HCT: 52 % — ABNORMAL HIGH (ref 36.0–46.0)
Hemoglobin: 16.4 g/dL — ABNORMAL HIGH (ref 12.0–15.0)
Immature Granulocytes: 0 %
Lymphocytes Relative: 25 %
Lymphs Abs: 2 10*3/uL (ref 0.7–4.0)
MCH: 27.9 pg (ref 26.0–34.0)
MCHC: 31.5 g/dL (ref 30.0–36.0)
MCV: 88.6 fL (ref 80.0–100.0)
Monocytes Absolute: 0.5 10*3/uL (ref 0.1–1.0)
Monocytes Relative: 6 %
Neutro Abs: 5.2 10*3/uL (ref 1.7–7.7)
Neutrophils Relative %: 65 %
Platelets: 179 10*3/uL (ref 150–400)
RBC: 5.87 MIL/uL — ABNORMAL HIGH (ref 3.87–5.11)
RDW: 14.6 % (ref 11.5–15.5)
WBC: 7.9 10*3/uL (ref 4.0–10.5)
nRBC: 0 % (ref 0.0–0.2)

## 2018-12-12 LAB — COMPREHENSIVE METABOLIC PANEL
ALT: 16 U/L (ref 0–44)
AST: 19 U/L (ref 15–41)
Albumin: 3.9 g/dL (ref 3.5–5.0)
Alkaline Phosphatase: 83 U/L (ref 38–126)
Anion gap: 9 (ref 5–15)
BUN: 27 mg/dL — ABNORMAL HIGH (ref 8–23)
CO2: 28 mmol/L (ref 22–32)
Calcium: 9.4 mg/dL (ref 8.9–10.3)
Chloride: 101 mmol/L (ref 98–111)
Creatinine, Ser: 1 mg/dL (ref 0.44–1.00)
GFR calc Af Amer: >60 mL/min (ref >60)
GFR calc non Af Amer: 55 mL/min — ABNORMAL LOW (ref >60)
Glucose, Bld: 156 mg/dL — ABNORMAL HIGH (ref 70–99)
Potassium: 3.8 mmol/L (ref 3.5–5.1)
Sodium: 138 mmol/L (ref 135–145)
Total Bilirubin: 0.5 mg/dL (ref 0.3–1.2)
Total Protein: 7.7 g/dL (ref 6.5–8.1)

## 2018-12-12 LAB — LIPASE, BLOOD: Lipase: 30 U/L (ref 11–51)

## 2018-12-12 MED ORDER — GABAPENTIN 300 MG PO CAPS
300.0000 mg | ORAL_CAPSULE | Freq: Once | ORAL | Status: AC
  Administered 2018-12-12: 300 mg via ORAL
  Filled 2018-12-12: qty 1

## 2018-12-12 MED ORDER — KETOROLAC TROMETHAMINE 30 MG/ML IJ SOLN
15.0000 mg | Freq: Once | INTRAMUSCULAR | Status: AC
  Administered 2018-12-12: 15 mg via INTRAVENOUS
  Filled 2018-12-12: qty 1

## 2018-12-12 MED ORDER — SODIUM CHLORIDE 0.9 % IV SOLN
Freq: Once | INTRAVENOUS | Status: AC
  Administered 2018-12-12: 19:00:00 via INTRAVENOUS

## 2018-12-12 MED ORDER — GABAPENTIN 300 MG PO CAPS: 300.0000 mg | ORAL_CAPSULE | Freq: Every day | ORAL | 0 refills | Status: DC

## 2018-12-12 NOTE — ED Provider Notes (Signed)
[facility] Emergency Department Provider Note       Time seen: ----------------------------------------- 5:55 PM on 12/12/2018 -----------------------------------------   I have reviewed the triage vital signs and the nursing notes.  HISTORY   Chief Complaint Chest Pain    HPI Tracy Dickerson is a 78 y.o. female with a history of breast and lung cancer, diabetes, GERD, heart disease, hypertension who presents to the ED for thoracoabdominal pain.  Patient states she has chest and upper abdominal pain that started after walking to [employer].  She has pain under her left breast and denies any radiation.  She denies nausea or vomiting but states she has been lightheaded.  She denies fevers, chills or other complaints.  Past Medical History:  Diagnosis Date  . Cancer ([place])    Breast and lung  . Diabetes mellitus without complication ([place])   . GERD (gastroesophageal reflux disease)   . Heart disease   . Hypertension     Patient Active Problem List   Diagnosis Date Noted  . Hyperkalemia   . Septic shock ([place])   . Sepsis due to urinary tract infection ([place]) 03/13/2018  . Acute kidney injury superimposed on chronic kidney disease ([place]) 02/13/2018  . Closed extra-articular fracture of distal tibia 02/02/2018  . Pneumonia 10/29/2017  . Hypotension 01/26/2017    Past Surgical History:  Procedure Laterality Date  . ABDOMINAL HYSTERECTOMY    . APPENDECTOMY    . BREAST SURGERY     breast cancer LEFT  . OPEN REDUCTION INTERNAL FIXATION (ORIF) TIBIA/FIBULA FRACTURE Left 02/03/2018   Procedure: OPEN REDUCTION INTERNAL FIXATION (ORIF) DISTAL TIBIA FRACTURE;  Surgeon: Corky Mull, MD;  Location: [facility] ORS;  Service: Orthopedics;  Laterality: Left;  ankle/lower leg  . TONSILLECTOMY      Allergies Oxycodone; Percocet [oxycodone-acetaminophen]; and Penicillins  Social History Social History   Tobacco Use  . Smoking status: Former Research scientist (life sciences)  . Smokeless  tobacco: Never Used  Substance Use Topics  . Alcohol use: Yes  . Drug use: No   Review of Systems Constitutional: Negative for fever. Cardiovascular: Negative for chest pain. Respiratory: Negative for shortness of breath. Gastrointestinal: Positive for abdominal pain Musculoskeletal: Negative for back pain. Skin: Negative for rash. Neurological: Negative for headaches, focal weakness or numbness.  All systems negative/normal/unremarkable except as stated in the HPI  ____________________________________________   PHYSICAL EXAM:  VITAL SIGNS: ED Triage Vitals  Enc Vitals Group     BP 12/12/18 1729 (!) 161/86     Pulse Rate 12/12/18 1729 91     Resp 12/12/18 1729 17     Temp 12/12/18 1729 97.9 F (36.6 C)     Temp Source 12/12/18 1729 Oral     SpO2 12/12/18 1729 98 %     Weight 12/12/18 1730 234 lb (106.1 kg)     Height 12/12/18 1730 5\' 6"  (1.676 m)     Head Circumference --      Peak Flow --      Pain Score 12/12/18 1729 10     Pain Loc --      Pain Edu? --      Excl. in [place]? --    Constitutional: Alert and oriented. Well appearing and in no distress. Eyes: Conjunctivae are normal. Normal extraocular movements. ENT      Head: Normocephalic and atraumatic.      Nose: No congestion/rhinnorhea.      Mouth/Throat: Mucous membranes are moist.      Neck: No stridor.  Cardiovascular: Normal rate, regular rhythm. No murmurs, rubs, or gallops. Respiratory: Normal respiratory effort without tachypnea nor retractions. Breath sounds are clear and equal bilaterally. No wheezes/rales/rhonchi. Gastrointestinal: Left upper quadrant tenderness, no rebound or guarding.  Normal bowel sounds. Musculoskeletal: Nontender with normal range of motion in extremities. No lower extremity tenderness nor edema. Neurologic:  Normal speech and language. No gross focal neurologic deficits are appreciated.  Skin:  Skin is warm, dry and intact. No rash noted. Psychiatric: Mood and affect are normal.  Speech and behavior are normal.  ____________________________________________  EKG: Interpreted by me.  Sinus rhythm with sinus arrhythmia, normal axis, normal QT  ____________________________________________  ED COURSE:  As part of my medical decision making, I reviewed the following data within the [place] History obtained from family if available, nursing notes, old chart and ekg, as well as notes from prior ED visits. Patient presented for left upper quadrant pain, we will assess with labs and imaging as indicated at this time.   Procedures ____________________________________________   LABS (pertinent positives/negatives)  Labs Reviewed  CBC WITH DIFFERENTIAL/PLATELET - Abnormal; Notable for the following components:      Result Value   RBC 5.87 (*)    Hemoglobin 16.4 (*)    HCT 52.0 (*)    All other components within normal limits  COMPREHENSIVE METABOLIC PANEL - Abnormal; Notable for the following components:   Glucose, Bld 156 (*)    BUN 27 (*)    GFR calc non Af Amer 55 (*)    All other components within normal limits  LIPASE, BLOOD  TROPONIN I  URINALYSIS, COMPLETE (UACMP) WITH MICROSCOPIC    RADIOLOGY Images were viewed by me  Acute abdominal series IMPRESSION: Chronic changes without acute abnormality. ____________________________________________   DIFFERENTIAL DIAGNOSIS   Musculoskeletal pain, GERD, anxiety, chronic pain, constipation, diverticulitis  FINAL ASSESSMENT AND PLAN  Abdominal pain   Plan: The patient had presented for left upper quadrant abdominal pain. Patient's labs did not reveal any acute process, she appeared borderline dehydrated and was given IV fluids. Patient's imaging was negative although to me resembled some constipation.  I advised taking MiraLAX.  She requested a prescription for gabapentin which I have supplied.  She is cleared for outpatient follow-up.   Laurence Aly, MD    Note: This note  was generated in part or whole with voice recognition software. Voice recognition is usually quite accurate but there are transcription errors that can and very often do occur. I apologize for any typographical errors that were not detected and corrected.     Earleen Newport, MD 12/12/18 [phone]

## 2018-12-12 NOTE — ED Triage Notes (Signed)
Pt to ED with c/o chest pain that started after walking to [employer]. Pt states pain I under left breast and denies radiation. Pt denies N/V but states light headed.

## 2018-12-12 NOTE — ED Notes (Signed)
Patient transported to X-ray 

## 2019-02-16 ENCOUNTER — Other Ambulatory Visit: Payer: Self-pay

## 2019-02-16 ENCOUNTER — Emergency Department
Admission: EM | Admit: 2019-02-16 | Discharge: 2019-02-16 | Disposition: A | Discharge status: Home / Self Care | Payer: Medicare HMO | Attending: Emergency Medicine | Admitting: Emergency Medicine

## 2019-02-16 DIAGNOSIS — R7309 Other abnormal glucose: Secondary | ICD-10-CM | POA: Diagnosis present

## 2019-02-16 DIAGNOSIS — Z87891 Personal history of nicotine dependence: Secondary | ICD-10-CM | POA: Insufficient documentation

## 2019-02-16 DIAGNOSIS — R51 Headache: Secondary | ICD-10-CM | POA: Insufficient documentation

## 2019-02-16 DIAGNOSIS — E119 Type 2 diabetes mellitus without complications: Secondary | ICD-10-CM | POA: Diagnosis not present

## 2019-02-16 DIAGNOSIS — Z7901 Long term (current) use of anticoagulants: Secondary | ICD-10-CM | POA: Diagnosis not present

## 2019-02-16 DIAGNOSIS — Z79899 Other long term (current) drug therapy: Secondary | ICD-10-CM | POA: Insufficient documentation

## 2019-02-16 DIAGNOSIS — E1165 Type 2 diabetes mellitus with hyperglycemia: Secondary | ICD-10-CM | POA: Diagnosis not present

## 2019-02-16 DIAGNOSIS — R519 Headache, unspecified: Secondary | ICD-10-CM

## 2019-02-16 LAB — GLUCOSE, CAPILLARY: Glucose-Capillary: 89 mg/dL (ref 70–99)

## 2019-02-16 NOTE — ED Provider Notes (Signed)
[facility] Emergency Department Provider Note  Time seen: 10:32 PM  I have reviewed the triage vital signs and the nursing notes.   HISTORY  Chief Complaint Headache   HPI Tracy Dickerson is a 78 y.o. female with a past medical history of diabetes, gastric reflux, hypertension presents to the emergency department for elevated blood glucose.  According to the patient yesterday after eating she checked her blood glucose and it was 290.  Today she ate 2 pieces of pizza, 2 doughnuts and checked her blood sugar and it was 230.  Patient was concerned that she could be diabetic so she came to the emergency department.  Patient states she has a history of diabetes but after staying in rehab after injuring her leg she was told that her blood sugars were normal and she no longer needed her metformin so she has not been taking it.  Patient states today after eating she had a mild headache and she thought it could be due to high blood sugar so she checked her blood sugar and it being 230 she came to the emergency department thinking she needed to be back on her medication.  Upon arrival to the emergency department patient's fingerstick blood glucose is 89 without any intervention.  Patient did state she took 1 of her brothers metformin tablets 500 mg this morning.  Patient has a follow-up appointment with her doctor next week for recheck/reevaluation.  Patient has no headache currently.  Denies any fever cough congestion.  Has no symptoms at this time.   Past Medical History:  Diagnosis Date  . Cancer ([place])    Breast and lung  . Diabetes mellitus without complication ([place])   . GERD (gastroesophageal reflux disease)   . Heart disease   . Hypertension     Patient Active Problem List   Diagnosis Date Noted  . Hyperkalemia   . Septic shock ([place])   . Sepsis due to urinary tract infection ([place]) 03/13/2018  . Acute kidney injury superimposed on chronic kidney disease ([place]) 02/13/2018   . Closed extra-articular fracture of distal tibia 02/02/2018  . Pneumonia 10/29/2017  . Hypotension 01/26/2017    Past Surgical History:  Procedure Laterality Date  . ABDOMINAL HYSTERECTOMY    . APPENDECTOMY    . BREAST SURGERY     breast cancer LEFT  . OPEN REDUCTION INTERNAL FIXATION (ORIF) TIBIA/FIBULA FRACTURE Left 02/03/2018   Procedure: OPEN REDUCTION INTERNAL FIXATION (ORIF) DISTAL TIBIA FRACTURE;  Surgeon: Corky Mull, MD;  Location: [facility] ORS;  Service: Orthopedics;  Laterality: Left;  ankle/lower leg  . TONSILLECTOMY      Prior to Admission medications   Medication Sig Start Date End Date Taking? Authorizing Provider  acetaminophen (TYLENOL) 325 MG tablet Take 2 tablets (650 mg total) by mouth every 6 (six) hours as needed for mild pain (or Fever >/= 101). 03/20/18   Loletha Grayer, MD  ADVAIR DISKUS 250-50 MCG/DOSE AEPB Inhale 1 puff into the lungs 2 (two) times daily. 12/14/16   [provider]  albuterol (PROAIR HFA) 108 (90 Base) MCG/ACT inhaler Inhale 2 puffs into the lungs every 4 (four) hours as needed. 04/08/17   [provider]  apixaban (ELIQUIS) 5 MG TABS tablet Take 5 mg by mouth 2 (two) times daily. 06/17/18   [provider]  atorvastatin (LIPITOR) 80 MG tablet Take 80 mg by mouth daily.    [provider]  bisacodyl (DULCOLAX) 10 MG suppository Place 1 suppository (10 mg total)  rectally daily as needed for moderate constipation. 02/16/18   [name], MD  calcium carbonate (TUMS - DOSED IN MG ELEMENTAL CALCIUM) 500 MG chewable tablet Chew 1 tablet (200 mg of elemental calcium total) by mouth 4 (four) times daily as needed for indigestion or heartburn. 03/20/18   Loletha Grayer, MD  famotidine (PEPCID) 20 MG tablet Take 1 tablet (20 mg total) by mouth 2 (two) times daily. 03/20/18   Wieting, Richard, MD  ferrous sulfate (SLOW FE) 160 (50 Fe) MG TBCR SR tablet Take 160 mg by mouth daily.  04/18/17   [provider]  folic  acid (FOLVITE) 1 MG tablet Take 1 tablet (1 mg total) by mouth daily. 03/20/18   Loletha Grayer, MD  furosemide (LASIX) 20 MG tablet Take 1 tablet (20 mg total) by mouth daily. 03/20/18   Loletha Grayer, MD  gabapentin (NEURONTIN) 300 MG capsule Take 1 capsule (300 mg total) by mouth at bedtime. 12/12/18   Earleen Newport, MD  magnesium oxide (MAG-OX) 400 (241.3 Mg) MG tablet Take 1 tablet (400 mg total) by mouth daily. 03/20/18   Loletha Grayer, MD  omeprazole (PRILOSEC) 40 MG capsule Take 40 mg by mouth daily. 01/15/17   [provider]  polyethylene glycol (MIRALAX / GLYCOLAX) packet Take 17 g by mouth daily as needed for mild constipation. 02/16/18   [name], MD  pramipexole (MIRAPEX) 0.25 MG tablet Take 0.25 mg by mouth at bedtime. 12/23/16   [provider]  SPIRIVA HANDIHALER 18 MCG inhalation capsule Place 1 capsule into inhaler and inhale daily. 01/26/18   [provider]  traMADol (ULTRAM) 50 MG tablet Take 1 tablet (50 mg total) by mouth every 6 (six) hours as needed for moderate pain or severe pain. 03/20/18   Loletha Grayer, MD    Allergies  Allergen Reactions  . Oxycodone   . Percocet [Oxycodone-Acetaminophen] Itching  . Penicillins Rash    Has patient had a PCN reaction causing immediate rash, facial/tongue/throat swelling, SOB or lightheadedness with hypotension: Yes Has patient had a PCN reaction causing severe rash involving mucus membranes or skin necrosis: Unknown Has patient had a PCN reaction that required hospitalization: Unknown Has patient had a PCN reaction occurring within the last 10 years: No If all of the above answers are "NO", then may proceed with Cephalosporin use.    Family History  Problem Relation Age of Onset  . Cancer Mother     Social History Social History   Tobacco Use  . Smoking status: Former Research scientist (life sciences)  . Smokeless tobacco: Never Used  Substance Use Topics  . Alcohol use: Yes  . Drug use: No    Review of  Systems Constitutional: Negative for fever. Cardiovascular: Negative for chest pain. Respiratory: Negative for shortness of breath. Gastrointestinal: Negative for abdominal pain, vomiting Genitourinary: Negative for urinary compaints Musculoskeletal: Negative for musculoskeletal complaints Skin: Negative for skin complaints  Neurological: Mild headache earlier, now resolved. All other ROS negative  ____________________________________________   PHYSICAL EXAM:  VITAL SIGNS: ED Triage Vitals  Enc Vitals Group     BP 02/16/19 1902 (!) 132/56     Pulse Rate 02/16/19 1902 96     Resp 02/16/19 1902 18     Temp 02/16/19 1902 97.7 F (36.5 C)     Temp Source 02/16/19 1902 Oral     SpO2 02/16/19 1902 100 %     Weight 02/16/19 1903 235 lb (106.6 kg)     Height 02/16/19 1903 5\' 6"  (1.676  m)     Head Circumference --      Peak Flow --      Pain Score 02/16/19 1903 9     Pain Loc --      Pain Edu? --      Excl. in [place]? --    Constitutional: Alert and oriented. Well appearing and in no distress. Eyes: Normal exam ENT      Head: Normocephalic and atraumatic.      Mouth/Throat: Mucous membranes are moist. Cardiovascular: Normal rate, regular rhythm. Respiratory: Normal respiratory effort without tachypnea nor retractions. Breath sounds are clear Gastrointestinal: Soft and nontender. No distention.  Musculoskeletal: Nontender with normal range of motion in all extremities.  Neurologic:  Normal speech and language. No gross focal neurologic deficits  Skin:  Skin is warm, dry and intact.  Psychiatric: Mood and affect are normal.  ____________________________________________   INITIAL IMPRESSION / ASSESSMENT AND PLAN / ED COURSE  Pertinent labs & imaging results that were available during my care of the patient were reviewed by me and considered in my medical decision making (see chart for details).   Patient presents emergency department with concerns of elevated blood glucose.   Patient's fingerstick upon arrival is 89.  Overall the patient appears well, no concern at this time for hyperglycemia or need to start urgently on metformin.  Patient states she has been checking her blood sugar every morning and that has been running in the low 100s.  Patient states he has a follow-up appointment with her doctor 02/25/2019.  I discussed with the patient I believe it would be safe for her to wait until her follow-up appointment with her doctor.  Maintain a low carbohydrate/glucose diet.  Patient agreeable to plan of care.  BERNEITA SANAGUSTIN was evaluated in Emergency Department on 02/16/2019 for the symptoms described in the history of present illness. She was evaluated in the context of the global COVID-19 pandemic, which necessitated consideration that the patient might be at risk for infection with the SARS-CoV-2 virus that causes COVID-19. Institutional protocols and algorithms that pertain to the evaluation of patients at risk for COVID-19 are in a state of rapid change based on information released by regulatory bodies including the CDC and federal and state organizations. These policies and algorithms were followed during the patient's care in the ED.  ____________________________________________   FINAL CLINICAL IMPRESSION(S) / ED DIAGNOSES  Normal blood glucose   Harvest Dark, MD 02/16/19 2234

## 2019-02-16 NOTE — Discharge Instructions (Addendum)
As we discussed please follow-up with your doctor as soon as possible.  Please drink plenty of non-sugary fluids and adhere to a low carbohydrate/glucose diet.  Please check your blood sugar several times throughout the day before eating.  Please maintain a journal/diary of your blood glucose readings to bring to your primary care appointment on the fourth as scheduled.  Return to the emergency department for any symptoms personally concerning to yourself.

## 2019-02-16 NOTE — ED Triage Notes (Addendum)
Pt reporting headache after she ate dinner tonight which consisted of pizza and 2 donuts. Pt states that she had same sx after eating chocolate yesterday. Pt reports taking brothers metformin today. Pt alert and oriented X4, active, cooperative, pt in NAD. RR even and unlabored, color WNL.  States that she wants her sugar checked and to get started back on Metformin if needed. Denies CP, weakness or SOB.

## 2019-06-14 ENCOUNTER — Encounter: Payer: Self-pay | Admitting: Emergency Medicine

## 2019-06-14 ENCOUNTER — Emergency Department: Payer: Medicare Other

## 2019-06-14 ENCOUNTER — Emergency Department
Admission: EM | Admit: 2019-06-14 | Discharge: 2019-06-14 | Disposition: A | Discharge status: Home / Self Care | Payer: Medicare Other | Attending: Student in an Organized Health Care Education/Training Program | Admitting: Student in an Organized Health Care Education/Training Program

## 2019-06-14 ENCOUNTER — Other Ambulatory Visit: Payer: Self-pay

## 2019-06-14 DIAGNOSIS — R05 Cough: Secondary | ICD-10-CM | POA: Diagnosis not present

## 2019-06-14 DIAGNOSIS — Z20828 Contact with and (suspected) exposure to other viral communicable diseases: Secondary | ICD-10-CM | POA: Diagnosis not present

## 2019-06-14 DIAGNOSIS — N189 Chronic kidney disease, unspecified: Secondary | ICD-10-CM | POA: Diagnosis not present

## 2019-06-14 DIAGNOSIS — E1122 Type 2 diabetes mellitus with diabetic chronic kidney disease: Secondary | ICD-10-CM | POA: Insufficient documentation

## 2019-06-14 DIAGNOSIS — Z7901 Long term (current) use of anticoagulants: Secondary | ICD-10-CM | POA: Diagnosis not present

## 2019-06-14 DIAGNOSIS — Z79899 Other long term (current) drug therapy: Secondary | ICD-10-CM | POA: Diagnosis not present

## 2019-06-14 DIAGNOSIS — I129 Hypertensive chronic kidney disease with stage 1 through stage 4 chronic kidney disease, or unspecified chronic kidney disease: Secondary | ICD-10-CM | POA: Insufficient documentation

## 2019-06-14 DIAGNOSIS — J069 Acute upper respiratory infection, unspecified: Secondary | ICD-10-CM | POA: Diagnosis not present

## 2019-06-14 DIAGNOSIS — R0981 Nasal congestion: Secondary | ICD-10-CM | POA: Diagnosis present

## 2019-06-14 DIAGNOSIS — Z87891 Personal history of nicotine dependence: Secondary | ICD-10-CM | POA: Diagnosis not present

## 2019-06-14 MED ORDER — METHYLPREDNISOLONE 4 MG PO TBPK: ORAL_TABLET | ORAL | 0 refills | Status: DC

## 2019-06-14 MED ORDER — GUAIFENESIN 100 MG/5ML PO SOLN: 5.0000 mL | ORAL | 0 refills | Status: DC | PRN

## 2019-06-14 NOTE — ED Provider Notes (Signed)
[facility] Emergency Department Provider Note   ____________________________________________   First MD Initiated Contact with Patient 06/14/19 1154     (approximate)  I have reviewed the triage vital signs and the nursing notes.   HISTORY  Chief Complaint Nasal Congestion and Cough    HPI Tracy Dickerson is a 78 y.o. female patient complain of productive cough for few weeks.  Patient states she took a flu shot and her second shingles shot last week.  Patient denies fever/chills associated this complaint.  Patient states sputum is clear.  Patient denies nausea, vomiting, diarrhea.  Patient stating to her knowledge no close contact with COVID-19 people.  Patient denies fatigue or pain.  No palliative measure for complaint.         Past Medical History:  Diagnosis Date  . 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 Diabetes mellitus without complication ([place])   . GERD (gastroesophageal reflux disease)   . Heart disease   . Hypertension     Patient Active Problem List   Diagnosis Date Noted  . Hyperkalemia   . Septic shock ([place])   . Sepsis due to urinary tract infection ([place]) 03/13/2018  . Acute kidney injury superimposed on chronic kidney disease ([place]) 02/13/2018  . Closed extra-articular fracture of distal tibia 02/02/2018  . Pneumonia 10/29/2017  . Hypotension 01/26/2017    Past Surgical History:  Procedure Laterality Date  . ABDOMINAL HYSTERECTOMY    . APPENDECTOMY    . BREAST SURGERY     breast cancer LEFT  . OPEN REDUCTION INTERNAL FIXATION (ORIF) TIBIA/FIBULA FRACTURE Left 02/03/2018   Procedure: OPEN REDUCTION INTERNAL FIXATION (ORIF) DISTAL TIBIA FRACTURE;  Surgeon: Corky Mull, MD;  Location: [facility] ORS;  Service: Orthopedics;  Laterality: Left;  ankle/lower leg  . TONSILLECTOMY      Prior to Admission medications   Medication Sig Start Date End Date Taking? Authorizing Provider  acetaminophen (TYLENOL) 325 MG tablet Take 2 tablets (650  mg total) by mouth every 6 (six) hours as needed for mild pain (or Fever >/= 101). 03/20/18   Loletha Grayer, MD  ADVAIR DISKUS 250-50 MCG/DOSE AEPB Inhale 1 puff into the lungs 2 (two) times daily. 12/14/16   [provider]  albuterol (PROAIR HFA) 108 (90 Base) MCG/ACT inhaler Inhale 2 puffs into the lungs every 4 (four) hours as needed. 04/08/17   [provider]  apixaban (ELIQUIS) 5 MG TABS tablet Take 5 mg by mouth 2 (two) times daily. 06/17/18   [provider]  atorvastatin (LIPITOR) 80 MG tablet Take 80 mg by mouth daily.    [provider]  bisacodyl (DULCOLAX) 10 MG suppository Place 1 suppository (10 mg total) rectally daily as needed for moderate constipation. 02/16/18   [name], MD  calcium carbonate (TUMS - DOSED IN MG ELEMENTAL CALCIUM) 500 MG chewable tablet Chew 1 tablet (200 mg of elemental calcium total) by mouth 4 (four) times daily as needed for indigestion or heartburn. 03/20/18   Loletha Grayer, MD  famotidine (PEPCID) 20 MG tablet Take 1 tablet (20 mg total) by mouth 2 (two) times daily. 03/20/18   Wieting, Richard, MD  ferrous sulfate (SLOW FE) 160 (50 Fe) MG TBCR SR tablet Take 160 mg by mouth daily.  04/18/17   [provider]  folic acid (FOLVITE) 1 MG tablet Take 1 tablet (1 mg total) by mouth daily. 03/20/18   Loletha Grayer, MD  furosemide (LASIX) 20 MG tablet Take 1  tablet (20 mg total) by mouth daily. 03/20/18   Loletha Grayer, MD  gabapentin (NEURONTIN) 300 MG capsule Take 1 capsule (300 mg total) by mouth at bedtime. 12/12/18   Earleen Newport, MD  guaiFENesin (ROBITUSSIN) 100 MG/5ML SOLN Take 5 mLs (100 mg total) by mouth every 4 (four) hours as needed for cough or to loosen phlegm. 06/14/19   Sable Feil, PA-C  magnesium oxide (MAG-OX) 400 (241.3 Mg) MG tablet Take 1 tablet (400 mg total) by mouth daily. 03/20/18   Loletha Grayer, MD  methylPREDNISolone (MEDROL DOSEPAK) 4 MG TBPK tablet Take Tapered dose as  directed 06/14/19   Sable Feil, PA-C  omeprazole (PRILOSEC) 40 MG capsule Take 40 mg by mouth daily. 01/15/17   [provider]  polyethylene glycol (MIRALAX / GLYCOLAX) packet Take 17 g by mouth daily as needed for mild constipation. 02/16/18   [name], MD  pramipexole (MIRAPEX) 0.25 MG tablet Take 0.25 mg by mouth at bedtime. 12/23/16   [provider]  SPIRIVA HANDIHALER 18 MCG inhalation capsule Place 1 capsule into inhaler and inhale daily. 01/26/18   [provider]    Allergies Oxycodone, Percocet [oxycodone-acetaminophen], and Penicillins  Family History  Problem Relation Age of Onset  . Cancer Mother     Social History Social History   Tobacco Use  . Smoking status: Former Research scientist (life sciences)  . Smokeless tobacco: Never Used  Substance Use Topics  . Alcohol use: Yes  . Drug use: No    Review of Systems  Constitutional: No fever/chills Eyes: No visual changes. ENT: No sore throat. Cardiovascular: Denies chest pain. Respiratory: Denies shortness of breath. Gastrointestinal: No abdominal pain.  No nausea, no vomiting.  No diarrhea.  No constipation. Genitourinary: Negative for dysuria. Musculoskeletal: Negative for back pain. Skin: Negative for rash. Neurological: Negative for headaches, focal weakness or numbness. Endocrine:  Hypertension. Hematological/Lymphatic:  Allergic/Immunilogical: Oxycodone, Percocet, penicillin. ____________________________________________   PHYSICAL EXAM:  VITAL SIGNS: ED Triage Vitals [06/14/19 1149]  Enc Vitals Group     BP (!) 111/92     Pulse Rate 81     Resp 20     Temp 98.3 F (36.8 C)     Temp Source Oral     SpO2 94 %     Weight 236 lb (107 kg)     Height 5\' 6"  (1.676 m)     Head Circumference      Peak Flow      Pain Score 0     Pain Loc      Pain Edu?      Excl. in [place]?    Constitutional: Alert and oriented. Well appearing and in no acute distress. Nose edematous nasal  turbinates. Mouth/Throat: Mucous membranes are moist.  Oropharynx non-erythematous. Neck: No stridor. No cervical spine tenderness to palpation. Hematological/Lymphatic/Immunilogical: No cervical lymphadenopathy. Cardiovascular: Normal rate, regular rhythm. Grossly normal heart sounds.  Good peripheral circulation. Respiratory: Normal respiratory effort.  No retractions. Lungs CTAB. Neurologic:  Normal speech and language. No gross focal neurologic deficits are appreciated. No gait instability. Skin:  Skin is warm, dry and intact. No rash noted. Psychiatric: Mood and affect are normal. Speech and behavior are normal.  ____________________________________________   LABS (all labs ordered are listed, but only abnormal results are displayed)  Labs Reviewed  NOVEL CORONAVIRUS, NAA (HOSP ORDER, SEND-OUT TO REF LAB; TAT 18-24 HRS)   ____________________________________________  EKG   ____________________________________________  RADIOLOGY  ED MD interpretation:    Official radiology report(s):  Dg Chest 1 View  Result Date: 06/14/2019 CLINICAL DATA:  Productive cough. EXAM: CHEST  1 VIEW COMPARISON:  Chest x-ray and chest CT dated 08/22/2018 FINDINGS: The heart size and pulmonary vascularity are normal. There is diffuse accentuation of the interstitial markings consistent with the interstitial fibrosis noted on the prior study. No change. No infiltrates or effusions. No acute bone abnormality. IMPRESSION: 1. No acute abnormalities. 2. Stable appearance of the chest since the prior study. 3. Stable interstitial fibrosis. Electronically Signed   By: Lorriane Shire M.D.   On: 06/14/2019 12:26    ____________________________________________   PROCEDURES  Procedure(s) performed (including Critical Care):  Procedures   ____________________________________________   INITIAL IMPRESSION / ASSESSMENT AND PLAN / ED COURSE  As part of my medical decision making, I reviewed the  following data within the [place] was evaluated in Emergency Department on 06/14/2019 for the symptoms described in the history of present illness. She was evaluated in the context of the global COVID-19 pandemic, which necessitated consideration that the patient might be at risk for infection with the SARS-CoV-2 virus that causes COVID-19. Institutional protocols and algorithms that pertain to the evaluation of patients at risk for COVID-19 are in a state of rapid change based on information released by regulatory bodies including the CDC and federal and state organizations. These policies and algorithms were followed during the patient's care in the ED.      Patient presents with 2 weeks of cough secondary to viral respiratory infection.  Discussed x-ray findings with patient.  Patient was given T3 COVID-19 test.  Patient advised self quarantine until test results are made available.  Take medication as directed.   ____________________________________________   FINAL CLINICAL IMPRESSION(S) / ED DIAGNOSES  Final diagnoses:  Viral URI with cough     ED Discharge Orders         Ordered    methylPREDNISolone (MEDROL DOSEPAK) 4 MG TBPK tablet     06/14/19 1255    guaiFENesin (ROBITUSSIN) 100 MG/5ML SOLN  Every 4 hours PRN     06/14/19 1255           Note:  This document was prepared using Dragon voice recognition software and may include unintentional dictation errors.    Sable Feil, PA-C 06/14/19 1257    Merlyn Lot, MD 06/14/19 [phone]

## 2019-06-14 NOTE — ED Triage Notes (Signed)
Pt in via EMS from home with c/o upper resp for a few weeks. EMS report pt sent so she could be evaluated for COVID97% RA, HR 102, 100/63 BP

## 2019-06-14 NOTE — ED Triage Notes (Signed)
Pt reports cold sx's, sneezing and nasal congestion for 2 weeks. Pt denies pain, SOB or other symptoms.

## 2019-06-14 NOTE — ED Notes (Signed)
See triage note  Presents with cough,runny nose and sore throat  States she developed cold sx's about 2 weeks ago  States she just"can't shake it"  Did have her flu shot and last shingles shot last week  Afebrile on arrival

## 2019-06-14 NOTE — Discharge Instructions (Signed)
Be advised she was tested for COVID-19 today.  Advised self quarantine until test results are made available.

## 2019-06-15 LAB — NOVEL CORONAVIRUS, NAA (HOSP ORDER, SEND-OUT TO REF LAB; TAT 18-24 HRS): SARS-CoV-2, NAA: NOT DETECTED

## 2019-08-31 ENCOUNTER — Emergency Department: Payer: Medicare Other

## 2019-08-31 ENCOUNTER — Emergency Department
Admission: EM | Admit: 2019-08-31 | Discharge: 2019-08-31 | Disposition: A | Discharge status: Home / Self Care | Payer: Medicare Other | Attending: Emergency Medicine | Admitting: Emergency Medicine

## 2019-08-31 ENCOUNTER — Other Ambulatory Visit: Payer: Self-pay

## 2019-08-31 DIAGNOSIS — Z79899 Other long term (current) drug therapy: Secondary | ICD-10-CM | POA: Insufficient documentation

## 2019-08-31 DIAGNOSIS — Z7901 Long term (current) use of anticoagulants: Secondary | ICD-10-CM | POA: Diagnosis not present

## 2019-08-31 DIAGNOSIS — Z20828 Contact with and (suspected) exposure to other viral communicable diseases: Secondary | ICD-10-CM | POA: Insufficient documentation

## 2019-08-31 DIAGNOSIS — J069 Acute upper respiratory infection, unspecified: Secondary | ICD-10-CM | POA: Diagnosis not present

## 2019-08-31 DIAGNOSIS — I10 Essential (primary) hypertension: Secondary | ICD-10-CM | POA: Insufficient documentation

## 2019-08-31 DIAGNOSIS — Z87891 Personal history of nicotine dependence: Secondary | ICD-10-CM | POA: Diagnosis not present

## 2019-08-31 DIAGNOSIS — E119 Type 2 diabetes mellitus without complications: Secondary | ICD-10-CM | POA: Diagnosis not present

## 2019-08-31 DIAGNOSIS — R05 Cough: Secondary | ICD-10-CM | POA: Diagnosis present

## 2019-08-31 LAB — SARS CORONAVIRUS 2 (TAT 6-24 HRS): SARS Coronavirus 2: NEGATIVE

## 2019-08-31 LAB — GROUP A STREP BY PCR: Group A Strep by PCR: NOT DETECTED

## 2019-08-31 MED ORDER — PSEUDOEPH-BROMPHEN-DM 30-2-10 MG/5ML PO SYRP: 5.0000 mL | ORAL_SOLUTION | Freq: Four times a day (QID) | ORAL | 0 refills | Status: DC | PRN

## 2019-08-31 NOTE — ED Triage Notes (Signed)
Pt in via EMS from home with c/o HA and sneezing.

## 2019-08-31 NOTE — ED Triage Notes (Signed)
Pt comes into the ED via EMS from home with c/o cough, congestion, body aches, sore throat for the past 2 days. Pt is in NAD, does not have transportation so had to get EMS to bring her today. Lives with her brother.

## 2019-08-31 NOTE — ED Provider Notes (Signed)
[facility] Emergency Department Provider Note   ____________________________________________   First MD Initiated Contact with Patient 08/31/19 1130     (approximate)  I have reviewed the triage vital signs and the nursing notes.   HISTORY  Chief Complaint URI    HPI Tracy Dickerson is a 78 y.o. female patient presents with cough, chest congestion, body aches, and sore throat for 2 days.  Patient denies recent travel or known exposure to COVID-19.  Patient denies nausea, vomiting, diarrhea.  Patient has taken a flu shot for this season.         Past Medical History:  Diagnosis Date  . 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 Diabetes mellitus without complication ([place])   . GERD (gastroesophageal reflux disease)   . Heart disease   . Hypertension     Patient Active Problem List   Diagnosis Date Noted  . Hyperkalemia   . Septic shock ([place])   . Sepsis due to urinary tract infection ([place]) 03/13/2018  . Acute kidney injury superimposed on chronic kidney disease ([place]) 02/13/2018  . Closed extra-articular fracture of distal tibia 02/02/2018  . Pneumonia 10/29/2017  . Hypotension 01/26/2017    Past Surgical History:  Procedure Laterality Date  . ABDOMINAL HYSTERECTOMY    . APPENDECTOMY    . BREAST SURGERY     breast cancer LEFT  . OPEN REDUCTION INTERNAL FIXATION (ORIF) TIBIA/FIBULA FRACTURE Left 02/03/2018   Procedure: OPEN REDUCTION INTERNAL FIXATION (ORIF) DISTAL TIBIA FRACTURE;  Surgeon: Corky Mull, MD;  Location: [facility] ORS;  Service: Orthopedics;  Laterality: Left;  ankle/lower leg  . TONSILLECTOMY      Prior to Admission medications   Medication Sig Start Date End Date Taking? Authorizing Provider  acetaminophen (TYLENOL) 325 MG tablet Take 2 tablets (650 mg total) by mouth every 6 (six) hours as needed for mild pain (or Fever >/= 101). 03/20/18   Loletha Grayer, MD  ADVAIR DISKUS 250-50 MCG/DOSE AEPB Inhale 1 puff into the lungs 2 (two)  times daily. 12/14/16   [provider]  albuterol (PROAIR HFA) 108 (90 Base) MCG/ACT inhaler Inhale 2 puffs into the lungs every 4 (four) hours as needed. 04/08/17   [provider]  apixaban (ELIQUIS) 5 MG TABS tablet Take 5 mg by mouth 2 (two) times daily. 06/17/18   [provider]  atorvastatin (LIPITOR) 80 MG tablet Take 80 mg by mouth daily.    [provider]  bisacodyl (DULCOLAX) 10 MG suppository Place 1 suppository (10 mg total) rectally daily as needed for moderate constipation. 02/16/18   [name], MD  brompheniramine-pseudoephedrine-DM 30-2-10 MG/5ML syrup Take 5 mLs by mouth 4 (four) times daily as needed. 08/31/19   Sable Feil, PA-C  calcium carbonate (TUMS - DOSED IN MG ELEMENTAL CALCIUM) 500 MG chewable tablet Chew 1 tablet (200 mg of elemental calcium total) by mouth 4 (four) times daily as needed for indigestion or heartburn. 03/20/18   Loletha Grayer, MD  famotidine (PEPCID) 20 MG tablet Take 1 tablet (20 mg total) by mouth 2 (two) times daily. 03/20/18   Wieting, Richard, MD  ferrous sulfate (SLOW FE) 160 (50 Fe) MG TBCR SR tablet Take 160 mg by mouth daily.  04/18/17   [provider]  folic acid (FOLVITE) 1 MG tablet Take 1 tablet (1 mg total) by mouth daily. 03/20/18   Loletha Grayer, MD  furosemide (LASIX) 20 MG tablet Take 1 tablet (20 mg total) by mouth  daily. 03/20/18   Loletha Grayer, MD  gabapentin (NEURONTIN) 300 MG capsule Take 1 capsule (300 mg total) by mouth at bedtime. 12/12/18   Earleen Newport, MD  guaiFENesin (ROBITUSSIN) 100 MG/5ML SOLN Take 5 mLs (100 mg total) by mouth every 4 (four) hours as needed for cough or to loosen phlegm. 06/14/19   Sable Feil, PA-C  magnesium oxide (MAG-OX) 400 (241.3 Mg) MG tablet Take 1 tablet (400 mg total) by mouth daily. 03/20/18   Loletha Grayer, MD  omeprazole (PRILOSEC) 40 MG capsule Take 40 mg by mouth daily. 01/15/17   [provider]  polyethylene glycol  (MIRALAX / GLYCOLAX) packet Take 17 g by mouth daily as needed for mild constipation. 02/16/18   [name], MD  pramipexole (MIRAPEX) 0.25 MG tablet Take 0.25 mg by mouth at bedtime. 12/23/16   [provider]  SPIRIVA HANDIHALER 18 MCG inhalation capsule Place 1 capsule into inhaler and inhale daily. 01/26/18   [provider]    Allergies Oxycodone, Percocet [oxycodone-acetaminophen], and Penicillins  Family History  Problem Relation Age of Onset  . Cancer Mother     Social History Social History   Tobacco Use  . Smoking status: Former Research scientist (life sciences)  . Smokeless tobacco: Never Used  Substance Use Topics  . Alcohol use: Yes  . Drug use: No    Review of Systems Constitutional: No fever/chills.  Body aches. Eyes: No visual changes. ENT: Sore throat.   Cardiovascular: Denies chest pain. Respiratory: Denies shortness of breath.  Nonproductive cough. Gastrointestinal: No abdominal pain.  No nausea, no vomiting.  No diarrhea.  No constipation. Genitourinary: Negative for dysuria. Musculoskeletal: Negative for back pain. Skin: Negative for rash. Neurological: Negative for headaches, focal weakness or numbness. Endocrine:  Diabetes, hyperlipidemia, and hypertension. Allergic/Immunilogical: OxyContin.  Percocet, and penicillin. ____________________________________________   PHYSICAL EXAM:  VITAL SIGNS: ED Triage Vitals  Enc Vitals Group     BP 08/31/19 1120 122/79     Pulse Rate 08/31/19 1120 79     Resp 08/31/19 1120 18     Temp 08/31/19 1120 98.3 F (36.8 C)     Temp Source 08/31/19 1120 Oral     SpO2 08/31/19 1120 95 %     Weight 08/31/19 1121 290 lb (131.5 kg)     Height 08/31/19 1121 5\' 6"  (1.676 m)     Head Circumference --      Peak Flow --      Pain Score 08/31/19 1121 0     Pain Loc --      Pain Edu? --      Excl. in [place]? --    Constitutional: Alert and oriented. Well appearing and in no acute distress.  Morbid obesity. Nose: No  congestion/rhinnorhea. Mouth/Throat: Mucous membranes are moist.  Oropharynx erythematous. Neck: No stridor.  Hematological/Lymphatic/Immunilogical: No cervical lymphadenopathy. Cardiovascular: Normal rate, regular rhythm. Grossly normal heart sounds.  Good peripheral circulation. Respiratory: Normal respiratory effort.  No retractions. Lungs CTAB. Gastrointestinal: Soft and nontender. No distention. No abdominal bruits. No CVA tenderness. Genitourinary: Deferred Musculoskeletal: No lower extremity tenderness nor edema.  No joint effusions. Neurologic:  Normal speech and language. No gross focal neurologic deficits are appreciated. No gait instability. Skin:  Skin is warm, dry and intact. No rash noted. Psychiatric: Mood and affect are normal. Speech and behavior are normal.  ____________________________________________   LABS (all labs ordered are listed, but only abnormal results are displayed)  Labs Reviewed  GROUP A STREP BY PCR  SARS  CORONAVIRUS 2 (TAT 6-24 HRS)   ____________________________________________  EKG   ____________________________________________  RADIOLOGY  ED MD interpretation:    Official radiology report(s): Dg Chest Portable 1 View  Result Date: 08/31/2019 CLINICAL DATA:  Cough and chest congestion EXAM: PORTABLE CHEST 1 VIEW COMPARISON:  CT chest 08/22/2018 FINDINGS: Diffuse bilateral chronic interstitial thickening. No focal consolidation, pleural effusion or pneumothorax. Stable cardiomediastinal silhouette. No aggressive osseous lesion. Surgical clips in the left axilla from prior axillary dissection. IMPRESSION: No acute cardiopulmonary disease. Electronically Signed   By: Kathreen Devoid   On: 08/31/2019 12:08    ____________________________________________   PROCEDURES  Procedure(s) performed (including Critical Care):  Procedures   ____________________________________________   INITIAL IMPRESSION / ASSESSMENT AND PLAN / ED COURSE  As  part of my medical decision making, I reviewed the following data within the [place]     Patient presents with 2 days of cough, congestion, body ache, sore throat.  Patient physical exam consistent with viral respiratory infection.  Discussed negative strep results with patient.  Chest x-ray was grossly unremarkable.  Patient advised to self quarantine pending results of COVID-19 test.  Take medication as directed.    RENIAH COTTINGHAM was evaluated in Emergency Department on 08/31/2019 for the symptoms described in the history of present illness. She was evaluated in the context of the global COVID-19 pandemic, which necessitated consideration that the patient might be at risk for infection with the SARS-CoV-2 virus that causes COVID-19. Institutional protocols and algorithms that pertain to the evaluation of patients at risk for COVID-19 are in a state of rapid change based on information released by regulatory bodies including the CDC and federal and state organizations. These policies and algorithms were followed during the patient's care in the ED.       ____________________________________________   FINAL CLINICAL IMPRESSION(S) / ED DIAGNOSES  Final diagnoses:  Viral URI with cough     ED Discharge Orders         Ordered    brompheniramine-pseudoephedrine-DM 30-2-10 MG/5ML syrup  4 times daily PRN     08/31/19 1254           Note:  This document was prepared using Dragon voice recognition software and may include unintentional dictation errors.    Sable Feil, PA-C 08/31/19 1256    Lavonia Drafts, MD 08/31/19 [phone]

## 2019-08-31 NOTE — Discharge Instructions (Addendum)
Follow discharge care instruction self quarantine pending results of COVID-19 test.

## 2019-08-31 NOTE — ED Notes (Signed)
See triage note  Presents with sore throat,cough ,congestion  States sx's started yesterday  Afebrile on arrival

## 2019-12-27 ENCOUNTER — Other Ambulatory Visit: Payer: Self-pay

## 2019-12-27 ENCOUNTER — Encounter: Payer: Self-pay | Admitting: Emergency Medicine

## 2019-12-27 ENCOUNTER — Emergency Department
Admission: EM | Admit: 2019-12-27 | Discharge: 2019-12-27 | Disposition: A | Discharge status: Home / Self Care | Payer: Medicare Other | Attending: Emergency Medicine | Admitting: Emergency Medicine

## 2019-12-27 DIAGNOSIS — E119 Type 2 diabetes mellitus without complications: Secondary | ICD-10-CM | POA: Diagnosis not present

## 2019-12-27 DIAGNOSIS — M79645 Pain in left finger(s): Secondary | ICD-10-CM | POA: Diagnosis present

## 2019-12-27 DIAGNOSIS — Z7984 Long term (current) use of oral hypoglycemic drugs: Secondary | ICD-10-CM | POA: Diagnosis not present

## 2019-12-27 DIAGNOSIS — L03012 Cellulitis of left finger: Secondary | ICD-10-CM | POA: Diagnosis not present

## 2019-12-27 MED ORDER — TRAMADOL HCL 50 MG PO TABS
50.0000 mg | ORAL_TABLET | Freq: Once | ORAL | Status: AC
  Administered 2019-12-27: 50 mg via ORAL
  Filled 2019-12-27: qty 1

## 2019-12-27 MED ORDER — LIDOCAINE-EPINEPHRINE-TETRACAINE (LET) TOPICAL GEL
3.0000 mL | Freq: Once | TOPICAL | Status: AC
  Administered 2019-12-27: 3 mL via TOPICAL
  Filled 2019-12-27: qty 3

## 2019-12-27 MED ORDER — SULFAMETHOXAZOLE-TRIMETHOPRIM 800-160 MG PO TABS: 1.0000 | ORAL_TABLET | Freq: Two times a day (BID) | ORAL | 0 refills | Status: DC

## 2019-12-27 MED ORDER — NEOSPORIN PLUS PAIN RELIEF MS 3.5-10000-10 EX CREA: TOPICAL_CREAM | Freq: Two times a day (BID) | CUTANEOUS | 0 refills | Status: DC

## 2019-12-27 MED ORDER — SULFAMETHOXAZOLE-TRIMETHOPRIM 800-160 MG PO TABS
1.0000 | ORAL_TABLET | Freq: Once | ORAL | Status: AC
  Administered 2019-12-27: 19:00:00 1 via ORAL
  Filled 2019-12-27: qty 1

## 2019-12-27 NOTE — Discharge Instructions (Addendum)
Follow discharge care instruction take medication as directed.  Advised Epson salt soaks for 10 minutes twice a day for 2 days.  Keep area clean and dry and apply Band-Aids until healing is complete.

## 2019-12-27 NOTE — ED Notes (Signed)
Pt placed in bed, concerned for bruise to inner corner of left eye, also states infection to nailbed third digit on left hand. NAD. Oriented to room and call bell.

## 2019-12-27 NOTE — ED Provider Notes (Signed)
[facility] Emergency Department Provider Note   ____________________________________________   First MD Initiated Contact with Patient 12/27/19 1727     (approximate)  I have reviewed the triage vital signs and the nursing notes.   HISTORY  Chief Complaint Abscess    HPI Tracy Dickerson is a 79 y.o. female patient presents with pain and edema to the third digit left hand nailbed.  Patient state last night she try to rupture the lesion and had slight drainage.  Patient states she is clean area with with peroxide.  Rates the pain as a 10/10.  Denies loss of sensation or loss of function.  Patient the pain is controlled with tramadol.      Past Medical History:  Diagnosis Date  . 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 Diabetes mellitus without complication ([place])   . GERD (gastroesophageal reflux disease)   . Heart disease   . Hypertension     Patient Active Problem List   Diagnosis Date Noted  . Hyperkalemia   . Septic shock ([place])   . Sepsis due to urinary tract infection ([place]) 03/13/2018  . Acute kidney injury superimposed on chronic kidney disease ([place]) 02/13/2018  . Closed extra-articular fracture of distal tibia 02/02/2018  . Pneumonia 10/29/2017  . Hypotension 01/26/2017    Past Surgical History:  Procedure Laterality Date  . ABDOMINAL HYSTERECTOMY    . APPENDECTOMY    . BREAST SURGERY     breast cancer LEFT  . OPEN REDUCTION INTERNAL FIXATION (ORIF) TIBIA/FIBULA FRACTURE Left 02/03/2018   Procedure: OPEN REDUCTION INTERNAL FIXATION (ORIF) DISTAL TIBIA FRACTURE;  Surgeon: Corky Mull, MD;  Location: [facility] ORS;  Service: Orthopedics;  Laterality: Left;  ankle/lower leg  . TONSILLECTOMY      Prior to Admission medications   Medication Sig Start Date End Date Taking? Authorizing Provider  acetaminophen (TYLENOL) 325 MG tablet Take 2 tablets (650 mg total) by mouth every 6 (six) hours as needed for mild pain (or Fever >/= 101). 03/20/18    Loletha Grayer, MD  ADVAIR DISKUS 250-50 MCG/DOSE AEPB Inhale 1 puff into the lungs 2 (two) times daily. 12/14/16   [provider]  albuterol (PROAIR HFA) 108 (90 Base) MCG/ACT inhaler Inhale 2 puffs into the lungs every 4 (four) hours as needed. 04/08/17   [provider]  apixaban (ELIQUIS) 5 MG TABS tablet Take 5 mg by mouth 2 (two) times daily. 06/17/18   [provider]  atorvastatin (LIPITOR) 80 MG tablet Take 80 mg by mouth daily.    [provider]  bisacodyl (DULCOLAX) 10 MG suppository Place 1 suppository (10 mg total) rectally daily as needed for moderate constipation. 02/16/18   [name], MD  brompheniramine-pseudoephedrine-DM 30-2-10 MG/5ML syrup Take 5 mLs by mouth 4 (four) times daily as needed. 08/31/19   Sable Feil, PA-C  calcium carbonate (TUMS - DOSED IN MG ELEMENTAL CALCIUM) 500 MG chewable tablet Chew 1 tablet (200 mg of elemental calcium total) by mouth 4 (four) times daily as needed for indigestion or heartburn. 03/20/18   Loletha Grayer, MD  famotidine (PEPCID) 20 MG tablet Take 1 tablet (20 mg total) by mouth 2 (two) times daily. 03/20/18   Wieting, Richard, MD  ferrous sulfate (SLOW FE) 160 (50 Fe) MG TBCR SR tablet Take 160 mg by mouth daily.  04/18/17   [provider]  folic acid (FOLVITE) 1 MG tablet Take 1 tablet (1 mg total) by mouth daily. 03/20/18  Loletha Grayer, MD  furosemide (LASIX) 20 MG tablet Take 1 tablet (20 mg total) by mouth daily. 03/20/18   Loletha Grayer, MD  gabapentin (NEURONTIN) 300 MG capsule Take 1 capsule (300 mg total) by mouth at bedtime. 12/12/18   Earleen Newport, MD  guaiFENesin (ROBITUSSIN) 100 MG/5ML SOLN Take 5 mLs (100 mg total) by mouth every 4 (four) hours as needed for cough or to loosen phlegm. 06/14/19   Sable Feil, PA-C  magnesium oxide (MAG-OX) 400 (241.3 Mg) MG tablet Take 1 tablet (400 mg total) by mouth daily. 03/20/18   Loletha Grayer, MD  neomycin-polymyxin-pramoxine  (NEOSPORIN PLUS) 1 % cream Apply topically 2 (two) times daily. 12/27/19   Sable Feil, PA-C  omeprazole (PRILOSEC) 40 MG capsule Take 40 mg by mouth daily. 01/15/17   [provider]  polyethylene glycol (MIRALAX / GLYCOLAX) packet Take 17 g by mouth daily as needed for mild constipation. 02/16/18   [name], MD  pramipexole (MIRAPEX) 0.25 MG tablet Take 0.25 mg by mouth at bedtime. 12/23/16   [provider]  SPIRIVA HANDIHALER 18 MCG inhalation capsule Place 1 capsule into inhaler and inhale daily. 01/26/18   [provider]  sulfamethoxazole-trimethoprim (BACTRIM DS) 800-160 MG tablet Take 1 tablet by mouth 2 (two) times daily. 12/27/19   Sable Feil, PA-C    Allergies Oxycodone, Percocet [oxycodone-acetaminophen], and Penicillins  Family History  Problem Relation Age of Onset  . Cancer Mother     Social History Social History   Tobacco Use  . Smoking status: Former Research scientist (life sciences)  . Smokeless tobacco: Never Used  Substance Use Topics  . Alcohol use: Yes  . Drug use: No    Review of Systems Constitutional: No fever/chills Eyes: No visual changes. ENT: No sore throat. Cardiovascular: Denies chest pain. Respiratory: Denies shortness of breath. Gastrointestinal: No abdominal pain.  No nausea, no vomiting.  No diarrhea.  No constipation. Genitourinary: Negative for dysuria. Musculoskeletal: Negative for back pain. Skin: Negative for rash. Neurological: Negative for headaches, focal weakness or numbness. Endocrine:  Diabetes and hypertension. Allergic/Immunilogical: Oxycodone, Percocet, and penicillin.  ____________________________________________   PHYSICAL EXAM:  VITAL SIGNS: ED Triage Vitals  Enc Vitals Group     BP 12/27/19 1722 94/64     Pulse Rate 12/27/19 1722 99     Resp 12/27/19 1722 20     Temp 12/27/19 1722 98.6 F (37 C)     Temp Source 12/27/19 1722 Oral     SpO2 12/27/19 1722 95 %     Weight 12/27/19 1723 228 lb (103.4 kg)      Height 12/27/19 1723 5\' 6"  (1.676 m)     Head Circumference --      Peak Flow --      Pain Score 12/27/19 1723 10     Pain Loc --      Pain Edu? --      Excl. in [place]? --    Constitutional: Alert and oriented. Well appearing and in no acute distress. Cardiovascular: Normal rate, regular rhythm. Grossly normal heart sounds.  Good peripheral circulation. Respiratory: Normal respiratory effort.  No retractions. Lungs CTAB. Neurologic:  Normal speech and language. No gross focal neurologic deficits are appreciated. No gait instability. Skin:  Skin is warm, dry and intact. No rash noted.  Edema and erythema nailbed of the third digit left hand. Psychiatric: Mood and affect are normal. Speech and behavior are normal.  ____________________________________________   LABS (all labs ordered are listed, but  only abnormal results are displayed)  Labs Reviewed - No data to display ____________________________________________  EKG   ____________________________________________  RADIOLOGY  ED MD interpretation:    Official radiology report(s): No results found.  ____________________________________________   PROCEDURES  Procedure(s) performed (including Critical Care):  Procedures   ____________________________________________   INITIAL IMPRESSION / ASSESSMENT AND PLAN / ED COURSE  As part of my medical decision making, I reviewed the following data within the [employer]     Patient presents with edema and erythema secondary to paronychia of the third digit left hand.  See procedure note for incision and drainage.  Patient given discharge care instruction advised take Bactrim as directed.  Follow-up with PCP.    NEVAEHA FINERTY was evaluated in Emergency Department on 12/27/2019 for the symptoms described in the history of present illness. She was evaluated in the context of the global COVID-19 pandemic, which necessitated consideration that the patient might be at  risk for infection with the SARS-CoV-2 virus that causes COVID-19. Institutional protocols and algorithms that pertain to the evaluation of patients at risk for COVID-19 are in a state of rapid change based on information released by regulatory bodies including the CDC and federal and state organizations. These policies and algorithms were followed during the patient's care in the ED.       ____________________________________________   FINAL CLINICAL IMPRESSION(S) / ED DIAGNOSES  Final diagnoses:  Paronychia of finger of left hand     ED Discharge Orders         Ordered    sulfamethoxazole-trimethoprim (BACTRIM DS) 800-160 MG tablet  2 times daily     12/27/19 1841    neomycin-polymyxin-pramoxine (NEOSPORIN PLUS) 1 % cream  2 times daily     12/27/19 1846           Note:  This document was prepared using Dragon voice recognition software and may include unintentional dictation errors.    Sable Feil, PA-C 12/27/19 Osa Craver, MD 12/27/19 2119

## 2019-12-27 NOTE — ED Triage Notes (Signed)
Pt presents to ED via [facility] with c/o L middle abscess around the nail. Pt states cleaned it peroxide last night and stuck it with a needle and was able to drain some pus out of it.

## 2020-01-04 NOTE — ED Provider Notes (Signed)
[place] EMERGENCY DEPARTMENT Provider Note   CSN: [id number] Arrival date & time: 12/27/19  1704     History Chief Complaint  Patient presents with  . Abscess    Tracy Dickerson is a 79 y.o. female.  HPI     Past Medical History:  Diagnosis Date  . 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 Diabetes mellitus without complication ([place])   . GERD (gastroesophageal reflux disease)   . Heart disease   . Hypertension     Patient Active Problem List   Diagnosis Date Noted  . Hyperkalemia   . Septic shock ([place])   . Sepsis due to urinary tract infection ([place]) 03/13/2018  . Acute kidney injury superimposed on chronic kidney disease ([place]) 02/13/2018  . Closed extra-articular fracture of distal tibia 02/02/2018  . Pneumonia 10/29/2017  . Hypotension 01/26/2017    Past Surgical History:  Procedure Laterality Date  . ABDOMINAL HYSTERECTOMY    . APPENDECTOMY    . BREAST SURGERY     breast cancer LEFT  . OPEN REDUCTION INTERNAL FIXATION (ORIF) TIBIA/FIBULA FRACTURE Left 02/03/2018   Procedure: OPEN REDUCTION INTERNAL FIXATION (ORIF) DISTAL TIBIA FRACTURE;  Surgeon: Corky Mull, MD;  Location: [facility] ORS;  Service: Orthopedics;  Laterality: Left;  ankle/lower leg  . TONSILLECTOMY       OB History    Gravida  0   Para  0   Term  0   Preterm  0   AB  0   Living  0     SAB  0   TAB  0   Ectopic  0   Multiple  0   Live Births              Family History  Problem Relation Age of Onset  . Cancer Mother     Social History   Tobacco Use  . Smoking status: Former Research scientist (life sciences)  . Smokeless tobacco: Never Used  Substance Use Topics  . Alcohol use: Yes  . Drug use: No    Home Medications Prior to Admission medications   Medication Sig Start Date End Date Taking? Authorizing Provider  acetaminophen (TYLENOL) 325 MG tablet Take 2 tablets (650 mg total) by mouth every 6 (six) hours as needed for mild pain (or Fever >/= 101). 03/20/18    Loletha Grayer, MD  ADVAIR DISKUS 250-50 MCG/DOSE AEPB Inhale 1 puff into the lungs 2 (two) times daily. 12/14/16   [provider]  albuterol (PROAIR HFA) 108 (90 Base) MCG/ACT inhaler Inhale 2 puffs into the lungs every 4 (four) hours as needed. 04/08/17   [provider]  apixaban (ELIQUIS) 5 MG TABS tablet Take 5 mg by mouth 2 (two) times daily. 06/17/18   [provider]  atorvastatin (LIPITOR) 80 MG tablet Take 80 mg by mouth daily.    [provider]  bisacodyl (DULCOLAX) 10 MG suppository Place 1 suppository (10 mg total) rectally daily as needed for moderate constipation. 02/16/18   [name], MD  brompheniramine-pseudoephedrine-DM 30-2-10 MG/5ML syrup Take 5 mLs by mouth 4 (four) times daily as needed. 08/31/19   Sable Feil, PA-C  calcium carbonate (TUMS - DOSED IN MG ELEMENTAL CALCIUM) 500 MG chewable tablet Chew 1 tablet (200 mg of elemental calcium total) by mouth 4 (four) times daily as needed for indigestion or heartburn. 03/20/18   Loletha Grayer, MD  famotidine (PEPCID) 20 MG tablet Take 1 tablet (20 mg total) by mouth 2 (  two) times daily. 03/20/18   Wieting, Richard, MD  ferrous sulfate (SLOW FE) 160 (50 Fe) MG TBCR SR tablet Take 160 mg by mouth daily.  04/18/17   [provider]  folic acid (FOLVITE) 1 MG tablet Take 1 tablet (1 mg total) by mouth daily. 03/20/18   Loletha Grayer, MD  furosemide (LASIX) 20 MG tablet Take 1 tablet (20 mg total) by mouth daily. 03/20/18   Loletha Grayer, MD  gabapentin (NEURONTIN) 300 MG capsule Take 1 capsule (300 mg total) by mouth at bedtime. 12/12/18   Earleen Newport, MD  magnesium oxide (MAG-OX) 400 (241.3 Mg) MG tablet Take 1 tablet (400 mg total) by mouth daily. 03/20/18   Loletha Grayer, MD  neomycin-polymyxin-pramoxine (NEOSPORIN PLUS) 1 % cream Apply topically 2 (two) times daily. 12/27/19   Sable Feil, PA-C  omeprazole (PRILOSEC) 40 MG capsule Take 40 mg by mouth daily. 01/15/17    [provider]  polyethylene glycol (MIRALAX / GLYCOLAX) packet Take 17 g by mouth daily as needed for mild constipation. 02/16/18   [name], MD  pramipexole (MIRAPEX) 0.25 MG tablet Take 0.25 mg by mouth at bedtime. 12/23/16   [provider]  SPIRIVA HANDIHALER 18 MCG inhalation capsule Place 1 capsule into inhaler and inhale daily. 01/26/18   [provider]  sulfamethoxazole-trimethoprim (BACTRIM DS) 800-160 MG tablet Take 1 tablet by mouth 2 (two) times daily. 12/27/19   Sable Feil, PA-C    Allergies    Oxycodone, Percocet [oxycodone-acetaminophen], and Penicillins  Review of Systems   Review of Systems  Physical Exam Updated Vital Signs BP 94/64 (BP Location: Right Arm)   Pulse 99   Temp 98.6 F (37 C) (Oral)   Resp 20   Ht 5\' 6"  (1.676 m)   Wt 103.4 kg   SpO2 95%   BMI 36.80 kg/m   Physical Exam  ED Results / Procedures / Treatments   Labs (all labs ordered are listed, but only abnormal results are displayed) Labs Reviewed - No data to display  EKG None  Radiology No results found.  Procedures .Marland KitchenIncision and Drainage  Date/Time: 01/04/2020 11:53 AM Performed by: Sable Feil, PA-C Authorized by: Sable Feil, PA-C   Consent:    Consent obtained:  Verbal   Consent given by:  Patient   Risks discussed:  Bleeding, incomplete drainage and pain Location:    Type:  Abscess   Location:  Upper extremity   Upper extremity location:  Finger   Finger location:  L long finger Pre-procedure details:    Skin preparation:  Betadine Anesthesia (see MAR for exact dosages):    Anesthesia method:  Nerve block   Block anesthetic:  Lidocaine 1% w/o epi   Block injection procedure:  Anatomic landmarks identified   Block outcome:  Anesthesia achieved Procedure type:    Complexity:  Simple Procedure details:    Incision types:  Stab incision   Incision depth:  Dermal   Scalpel blade:  11   Drainage amount:  Scant   Wound  treatment:  Wound left open Post-procedure details:    Patient tolerance of procedure:  Tolerated well, no immediate complications   (including critical care time)  Medications Ordered in ED Medications  lidocaine-EPINEPHrine-tetracaine (LET) topical gel (3 mLs Topical Given 12/27/19 1809)  traMADol (ULTRAM) tablet 50 mg (50 mg Oral Given 12/27/19 1855)  sulfamethoxazole-trimethoprim (BACTRIM DS) 800-160 MG per tablet 1 tablet (1 tablet Oral Given 12/27/19 1855)    ED  Course  I have reviewed the triage vital signs and the nursing notes.  Pertinent labs & imaging results that were available during my care of the patient were reviewed by me and considered in my medical decision making (see chart for details).    MDM Rules/Calculators/A&P                       Final Clinical Impression(s) / ED Diagnoses Final diagnoses:  Paronychia of finger of left hand    Rx / DC Orders ED Discharge Orders         Ordered    sulfamethoxazole-trimethoprim (BACTRIM DS) 800-160 MG tablet  2 times daily     12/27/19 1841    neomycin-polymyxin-pramoxine (NEOSPORIN PLUS) 1 % cream  2 times daily     12/27/19 1846           Sable Feil, PA-C 01/04/20 1156    Delman Kitten, MD 01/18/20 0028

## 2020-03-07 ENCOUNTER — Emergency Department
Admission: EM | Admit: 2020-03-07 | Discharge: 2020-03-07 | Disposition: A | Discharge status: Home / Self Care | Payer: Medicare Other | Attending: Student in an Organized Health Care Education/Training Program | Admitting: Student in an Organized Health Care Education/Training Program

## 2020-03-07 ENCOUNTER — Other Ambulatory Visit: Payer: Self-pay

## 2020-03-07 ENCOUNTER — Emergency Department: Payer: Medicare Other

## 2020-03-07 DIAGNOSIS — E119 Type 2 diabetes mellitus without complications: Secondary | ICD-10-CM | POA: Diagnosis not present

## 2020-03-07 DIAGNOSIS — I10 Essential (primary) hypertension: Secondary | ICD-10-CM | POA: Diagnosis not present

## 2020-03-07 DIAGNOSIS — Z7901 Long term (current) use of anticoagulants: Secondary | ICD-10-CM | POA: Diagnosis not present

## 2020-03-07 DIAGNOSIS — Z87891 Personal history of nicotine dependence: Secondary | ICD-10-CM | POA: Insufficient documentation

## 2020-03-07 DIAGNOSIS — Z7984 Long term (current) use of oral hypoglycemic drugs: Secondary | ICD-10-CM | POA: Diagnosis not present

## 2020-03-07 DIAGNOSIS — Z79899 Other long term (current) drug therapy: Secondary | ICD-10-CM | POA: Diagnosis not present

## 2020-03-07 DIAGNOSIS — R42 Dizziness and giddiness: Secondary | ICD-10-CM | POA: Insufficient documentation

## 2020-03-07 DIAGNOSIS — R300 Dysuria: Secondary | ICD-10-CM | POA: Insufficient documentation

## 2020-03-07 LAB — BASIC METABOLIC PANEL
Anion gap: 12 (ref 5–15)
BUN: 20 mg/dL (ref 8–23)
CO2: 26 mmol/L (ref 22–32)
Calcium: 9.3 mg/dL (ref 8.9–10.3)
Chloride: 103 mmol/L (ref 98–111)
Creatinine, Ser: 0.89 mg/dL (ref 0.44–1.00)
GFR calc Af Amer: >60 mL/min (ref >60)
GFR calc non Af Amer: >60 mL/min (ref >60)
Glucose, Bld: 97 mg/dL (ref 70–99)
Potassium: 3.3 mmol/L — ABNORMAL LOW (ref 3.5–5.1)
Sodium: 141 mmol/L (ref 135–145)

## 2020-03-07 LAB — URINALYSIS, COMPLETE (UACMP) WITH MICROSCOPIC
Bilirubin Urine: NEGATIVE
Glucose, UA: NEGATIVE mg/dL
Ketones, ur: 5 mg/dL — AB
Nitrite: NEGATIVE
Protein, ur: 30 mg/dL — AB
Specific Gravity, Urine: 1.015 (ref 1.005–1.030)
pH: 5 (ref 5.0–8.0)

## 2020-03-07 LAB — CBC
HCT: 39.1 % (ref 36.0–46.0)
Hemoglobin: 13.3 g/dL (ref 12.0–15.0)
MCH: 31.1 pg (ref 26.0–34.0)
MCHC: 34 g/dL (ref 30.0–36.0)
MCV: 91.6 fL (ref 80.0–100.0)
Platelets: 237 10*3/uL (ref 150–400)
RBC: 4.27 MIL/uL (ref 3.87–5.11)
RDW: 15.3 % (ref 11.5–15.5)
WBC: 10.9 10*3/uL — ABNORMAL HIGH (ref 4.0–10.5)
nRBC: 0 % (ref 0.0–0.2)

## 2020-03-07 LAB — TROPONIN I (HIGH SENSITIVITY)
Troponin I (High Sensitivity): 6 ng/L (ref <18)
Troponin I (High Sensitivity): 8 ng/L (ref <18)

## 2020-03-07 MED ORDER — SODIUM CHLORIDE 0.9% FLUSH: 3.0000 mL | Freq: Once | INTRAVENOUS | Status: DC

## 2020-03-07 MED ORDER — CEPHALEXIN 500 MG PO CAPS: 500.0000 mg | ORAL_CAPSULE | Freq: Three times a day (TID) | ORAL | 0 refills | Status: AC

## 2020-03-07 MED ORDER — CEPHALEXIN 500 MG PO CAPS
500.0000 mg | ORAL_CAPSULE | Freq: Once | ORAL | Status: AC
  Administered 2020-03-07: 500 mg via ORAL
  Filled 2020-03-07: qty 1

## 2020-03-07 NOTE — ED Provider Notes (Signed)
[facility] Emergency Department Provider Note    First MD Initiated Contact with Patient 03/07/20 1737     (approximate)  I have reviewed the triage vital signs and the nursing notes.   HISTORY  Chief Complaint Near Syncope    HPI Tracy Dickerson is a 79 y.o. female with the below listed past medical history presents to the ER for evaluation of lightheadedness and dizziness associate with some shortness of breath that occurs every time she uses a new vape pen that she bought 1 week ago.  States she used to smoke does have remote history of lung cancer and breast cancer is currently on Eliquis as well.  Denies any pain with deep inspiration.  Denies any syncopal events.  No fevers.  Symptoms occur with smoking.  Did have some chest pain earlier today while she was at [facility] and also brought her to the ER.  Denies any pressure right now.    Past Medical History:  Diagnosis Date  . Cancer ([place])    Breast and lung  . Diabetes mellitus without complication ([place])   . GERD (gastroesophageal reflux disease)   . Heart disease   . Hypertension    Family History  Problem Relation Age of Onset  . Cancer Mother    Past Surgical History:  Procedure Laterality Date  . ABDOMINAL HYSTERECTOMY    . APPENDECTOMY    . BREAST SURGERY     breast cancer LEFT  . OPEN REDUCTION INTERNAL FIXATION (ORIF) TIBIA/FIBULA FRACTURE Left 02/03/2018   Procedure: OPEN REDUCTION INTERNAL FIXATION (ORIF) DISTAL TIBIA FRACTURE;  Surgeon: Corky Mull, MD;  Location: [facility] ORS;  Service: Orthopedics;  Laterality: Left;  ankle/lower leg  . TONSILLECTOMY     Patient Active Problem List   Diagnosis Date Noted  . Hyperkalemia   . Septic shock ([place])   . Sepsis due to urinary tract infection ([place]) 03/13/2018  . Acute kidney injury superimposed on chronic kidney disease ([place]) 02/13/2018  . Closed extra-articular fracture of distal tibia 02/02/2018  . Pneumonia 10/29/2017  . Hypotension  01/26/2017      Prior to Admission medications   Medication Sig Start Date End Date Taking? Authorizing Provider  acetaminophen (TYLENOL) 325 MG tablet Take 2 tablets (650 mg total) by mouth every 6 (six) hours as needed for mild pain (or Fever >/= 101). 03/20/18   Loletha Grayer, MD  ADVAIR DISKUS 250-50 MCG/DOSE AEPB Inhale 1 puff into the lungs 2 (two) times daily. 12/14/16   [provider]  albuterol (PROAIR HFA) 108 (90 Base) MCG/ACT inhaler Inhale 2 puffs into the lungs every 4 (four) hours as needed. 04/08/17   [provider]  apixaban (ELIQUIS) 5 MG TABS tablet Take 5 mg by mouth 2 (two) times daily. 06/17/18   [provider]  atorvastatin (LIPITOR) 80 MG tablet Take 80 mg by mouth daily.    [provider]  bisacodyl (DULCOLAX) 10 MG suppository Place 1 suppository (10 mg total) rectally daily as needed for moderate constipation. 02/16/18   [name], MD  brompheniramine-pseudoephedrine-DM 30-2-10 MG/5ML syrup Take 5 mLs by mouth 4 (four) times daily as needed. 08/31/19   Sable Feil, PA-C  calcium carbonate (TUMS - DOSED IN MG ELEMENTAL CALCIUM) 500 MG chewable tablet Chew 1 tablet (200 mg of elemental calcium total) by mouth 4 (four) times daily as needed for indigestion or heartburn. 03/20/18   Loletha Grayer, MD  cephALEXin (KEFLEX) 500 MG capsule Take 1 capsule (  500 mg total) by mouth 3 (three) times daily for 5 days. 03/07/20 03/12/20  Merlyn Lot, MD  famotidine (PEPCID) 20 MG tablet Take 1 tablet (20 mg total) by mouth 2 (two) times daily. 03/20/18   Wieting, Richard, MD  ferrous sulfate (SLOW FE) 160 (50 Fe) MG TBCR SR tablet Take 160 mg by mouth daily.  04/18/17   [provider]  folic acid (FOLVITE) 1 MG tablet Take 1 tablet (1 mg total) by mouth daily. 03/20/18   Loletha Grayer, MD  furosemide (LASIX) 20 MG tablet Take 1 tablet (20 mg total) by mouth daily. 03/20/18   Loletha Grayer, MD  gabapentin (NEURONTIN) 300 MG  capsule Take 1 capsule (300 mg total) by mouth at bedtime. 12/12/18   Earleen Newport, MD  magnesium oxide (MAG-OX) 400 (241.3 Mg) MG tablet Take 1 tablet (400 mg total) by mouth daily. 03/20/18   Loletha Grayer, MD  neomycin-polymyxin-pramoxine (NEOSPORIN PLUS) 1 % cream Apply topically 2 (two) times daily. 12/27/19   Sable Feil, PA-C  omeprazole (PRILOSEC) 40 MG capsule Take 40 mg by mouth daily. 01/15/17   [provider]  polyethylene glycol (MIRALAX / GLYCOLAX) packet Take 17 g by mouth daily as needed for mild constipation. 02/16/18   [name], MD  pramipexole (MIRAPEX) 0.25 MG tablet Take 0.25 mg by mouth at bedtime. 12/23/16   [provider]  SPIRIVA HANDIHALER 18 MCG inhalation capsule Place 1 capsule into inhaler and inhale daily. 01/26/18   [provider]  sulfamethoxazole-trimethoprim (BACTRIM DS) 800-160 MG tablet Take 1 tablet by mouth 2 (two) times daily. 12/27/19   Sable Feil, PA-C    Allergies Oxycodone, Percocet [oxycodone-acetaminophen], and Penicillins    Social History Social History   Tobacco Use  . Smoking status: Former Research scientist (life sciences)  . Smokeless tobacco: Never Used  Vaping Use  . Vaping Use: Never used  Substance Use Topics  . Alcohol use: Yes  . Drug use: No    Review of Systems Patient denies headaches, rhinorrhea, blurry vision, numbness, shortness of breath, chest pain, edema, cough, abdominal pain, nausea, vomiting, diarrhea, dysuria, fevers, rashes or hallucinations unless otherwise stated above in HPI. ____________________________________________   PHYSICAL EXAM:  VITAL SIGNS: Vitals:   03/07/20 1337 03/07/20 1710  BP: (!) 101/54 122/68  Pulse: 95 90  Resp:  19  Temp: 98 F (36.7 C)   SpO2: 97% 98%    Constitutional: Alert and oriented.  Eyes: Conjunctivae are normal.  Head: Atraumatic. Nose: No congestion/rhinnorhea. Mouth/Throat: Mucous membranes are moist.   Neck: No stridor. Painless ROM.   Cardiovascular: Normal rate, regular rhythm. Grossly normal heart sounds.  Good peripheral circulation. Respiratory: Normal respiratory effort.  No retractions. Lungs CTAB. Gastrointestinal: Soft and nontender. No distention. No abdominal bruits. No CVA tenderness. Genitourinary:  Musculoskeletal: No lower extremity tenderness nor edema.  No joint effusions. Neurologic:  Normal speech and language. No gross focal neurologic deficits are appreciated. No facial droop Skin:  Skin is warm, dry and intact. No rash noted. Psychiatric: Mood and affect are normal. Speech and behavior are normal.  ____________________________________________   LABS (all labs ordered are listed, but only abnormal results are displayed)  Results for orders placed or performed during the hospital encounter of 03/07/20 (from the past 24 hour(s))  Basic metabolic panel     Status: Abnormal   Collection Time: 03/07/20  1:37 PM  Result Value Ref Range   Sodium 141 135 - 145 mmol/L   Potassium 3.3 (L)  3.5 - 5.1 mmol/L   Chloride 103 98 - 111 mmol/L   CO2 26 22 - 32 mmol/L   Glucose, Bld 97 70 - 99 mg/dL   BUN 20 8 - 23 mg/dL   Creatinine, Ser 0.89 0.44 - 1.00 mg/dL   Calcium 9.3 8.9 - 10.3 mg/dL   GFR calc non Af Amer >60 >60 mL/min   GFR calc Af Amer >60 >60 mL/min   Anion gap 12 5 - 15  CBC     Status: Abnormal   Collection Time: 03/07/20  1:37 PM  Result Value Ref Range   WBC 10.9 (H) 4.0 - 10.5 K/uL   RBC 4.27 3.87 - 5.11 MIL/uL   Hemoglobin 13.3 12.0 - 15.0 g/dL   HCT 39.1 36 - 46 %   MCV 91.6 80.0 - 100.0 fL   MCH 31.1 26.0 - 34.0 pg   MCHC 34.0 30.0 - 36.0 g/dL   RDW 15.3 11.5 - 15.5 %   Platelets 237 150 - 400 K/uL   nRBC 0.0 0.0 - 0.2 %  Urinalysis, Complete w Microscopic     Status: Abnormal   Collection Time: 03/07/20  1:37 PM  Result Value Ref Range   Color, Urine YELLOW (A) YELLOW   APPearance CLOUDY (A) CLEAR   Specific Gravity, Urine 1.015 1.005 - 1.030   pH 5.0 5.0 - 8.0   Glucose,  UA NEGATIVE NEGATIVE mg/dL   Hgb urine dipstick SMALL (A) NEGATIVE   Bilirubin Urine NEGATIVE NEGATIVE   Ketones, ur 5 (A) NEGATIVE mg/dL   Protein, ur 30 (A) NEGATIVE mg/dL   Nitrite NEGATIVE NEGATIVE   Leukocytes,Ua MODERATE (A) NEGATIVE   RBC / HPF 6-10 0 - 5 RBC/hpf   WBC, UA 21-50 0 - 5 WBC/hpf   Bacteria, UA MANY (A) NONE SEEN   Squamous Epithelial / LPF 0-5 0 - 5   Mucus PRESENT   Troponin I (High Sensitivity)     Status: None   Collection Time: 03/07/20  1:37 PM  Result Value Ref Range   Troponin I (High Sensitivity) 6 <18 ng/L  Troponin I (High Sensitivity)     Status: None   Collection Time: 03/07/20  6:05 PM  Result Value Ref Range   Troponin I (High Sensitivity) 8 <18 ng/L   ____________________________________________  EKG My review and personal interpretation at Time: 13:48   Indication: dizziness  Rate: 95  Rhythm: sinus with pacs Axis: normal Other: normal intervals, no stemi ____________________________________________  RADIOLOGY  I personally reviewed all radiographic images ordered to evaluate for the above acute complaints and reviewed radiology reports and findings.  These findings were personally discussed with the patient.  Please see medical record for radiology report.  ____________________________________________   PROCEDURES  Procedure(s) performed:  Procedures    Critical Care performed: no ____________________________________________   INITIAL IMPRESSION / ASSESSMENT AND PLAN / ED COURSE  Pertinent labs & imaging results that were available during my care of the patient were reviewed by me and considered in my medical decision making (see chart for details).   DDX: Dehydration, UTI, electrolyte abnormality, inhalation injury, bronchitis, asthma, CHF, pneumonia, cancer, PE  BRYTNI DRAY is a 79 y.o. who presents to the ED with symptoms as described above.  Patient nontoxic-appearing in no acute distress.  Exam is reassuring.  Exam and  history not felt to be consistent with PE furthermore states she is on anticoagulation therefore further decreasing likelihood of PE.  Not consistent with acute bronchitis asthma  or COPD.  I do suspect some component of symptoms secondary to inhaled chemicals related to his vape pen and have instructed her to stop using that.  The patient will be placed on continuous pulse oximetry and telemetry for monitoring.  Laboratory evaluation will be sent to evaluate for the above complaints.     Clinical Course as of Mar 08 1847  Tue Mar 07, 2020  1829 Patient does endorse increased urinary frequency.  Still waiting on repeat troponin.  Work-up and exam is reassuring thus far.  States that she is able to tolerate Keflex in the past.   [PR]  [place] is negative.  Is not consistent with ACS.  Again reinforced importance of stopping use of vape pen.  Will be treated for symptomatic UTI.  Have discussed with the patient and available family all diagnostics and treatments performed thus far and all questions were answered to the best of my ability. The patient demonstrates understanding and agreement with plan.    [PR]    Clinical Course User Index [PR] Merlyn Lot, MD    The patient was evaluated in Emergency Department today for the symptoms described in the history of present illness. He/she was evaluated in the context of the global COVID-19 pandemic, which necessitated consideration that the patient might be at risk for infection with the SARS-CoV-2 virus that causes COVID-19. Institutional protocols and algorithms that pertain to the evaluation of patients at risk for COVID-19 are in a state of rapid change based on information released by regulatory bodies including the CDC and federal and state organizations. These policies and algorithms were followed during the patient's care in the ED.  As part of my medical decision making, I reviewed the following data within the electronic medical  record:  Nursing notes reviewed and incorporated, Labs reviewed, notes from prior ED visits and [place] Controlled Substance Database   ____________________________________________   FINAL CLINICAL IMPRESSION(S) / ED DIAGNOSES  Final diagnoses:  Dysuria  Dizziness      NEW MEDICATIONS STARTED DURING THIS VISIT:  New Prescriptions   CEPHALEXIN (KEFLEX) 500 MG CAPSULE    Take 1 capsule (500 mg total) by mouth 3 (three) times daily for 5 days.     Note:  This document was prepared using Dragon voice recognition software and may include unintentional dictation errors.    Merlyn Lot, MD 03/07/20 Valerie Roys

## 2020-03-07 NOTE — ED Triage Notes (Signed)
Pt in via EMS with c/o nausea and dizziness after hitting a vape. Vape had nicotine in it, no THC. BP 126/81, 96% RA, cgb 148, HR 62

## 2020-03-07 NOTE — ED Triage Notes (Signed)
Pt arrives via [facility] from home for reports of becoming dizzy after using vape. Pt states she started using this vape last week and states she gets dizzy when using it. Pt states she does not think the cartridges she uses has marijuana or THC in them. Pt A&Ox4 and in NAD.

## 2020-04-22 ENCOUNTER — Observation Stay
Admission: EM | Admit: 2020-04-22 | Discharge: 2020-04-23 | Disposition: A | Discharge status: Home / Self Care | Payer: Medicare Other | Attending: Internal Medicine | Admitting: Internal Medicine

## 2020-04-22 ENCOUNTER — Emergency Department: Payer: Medicare Other

## 2020-04-22 ENCOUNTER — Other Ambulatory Visit: Payer: Self-pay

## 2020-04-22 ENCOUNTER — Encounter: Payer: Self-pay | Admitting: *Deleted

## 2020-04-22 DIAGNOSIS — I480 Paroxysmal atrial fibrillation: Secondary | ICD-10-CM | POA: Insufficient documentation

## 2020-04-22 DIAGNOSIS — I959 Hypotension, unspecified: Secondary | ICD-10-CM | POA: Diagnosis not present

## 2020-04-22 DIAGNOSIS — E860 Dehydration: Secondary | ICD-10-CM | POA: Diagnosis not present

## 2020-04-22 DIAGNOSIS — Z85118 Personal history of other malignant neoplasm of bronchus and lung: Secondary | ICD-10-CM | POA: Insufficient documentation

## 2020-04-22 DIAGNOSIS — Z20822 Contact with and (suspected) exposure to covid-19: Secondary | ICD-10-CM | POA: Diagnosis not present

## 2020-04-22 DIAGNOSIS — Z853 Personal history of malignant neoplasm of breast: Secondary | ICD-10-CM | POA: Insufficient documentation

## 2020-04-22 DIAGNOSIS — R0789 Other chest pain: Secondary | ICD-10-CM | POA: Diagnosis not present

## 2020-04-22 DIAGNOSIS — Z87891 Personal history of nicotine dependence: Secondary | ICD-10-CM | POA: Insufficient documentation

## 2020-04-22 DIAGNOSIS — E119 Type 2 diabetes mellitus without complications: Secondary | ICD-10-CM | POA: Diagnosis not present

## 2020-04-22 DIAGNOSIS — Z7982 Long term (current) use of aspirin: Secondary | ICD-10-CM | POA: Diagnosis not present

## 2020-04-22 DIAGNOSIS — Z7901 Long term (current) use of anticoagulants: Secondary | ICD-10-CM | POA: Diagnosis not present

## 2020-04-22 DIAGNOSIS — N179 Acute kidney failure, unspecified: Secondary | ICD-10-CM | POA: Diagnosis not present

## 2020-04-22 DIAGNOSIS — R42 Dizziness and giddiness: Secondary | ICD-10-CM | POA: Diagnosis present

## 2020-04-22 DIAGNOSIS — Z79899 Other long term (current) drug therapy: Secondary | ICD-10-CM | POA: Diagnosis not present

## 2020-04-22 LAB — COMPREHENSIVE METABOLIC PANEL
ALT: 10 U/L (ref 0–44)
AST: 16 U/L (ref 15–41)
Albumin: 3.6 g/dL (ref 3.5–5.0)
Alkaline Phosphatase: 66 U/L (ref 38–126)
Anion gap: 11 (ref 5–15)
BUN: 25 mg/dL — ABNORMAL HIGH (ref 8–23)
CO2: 21 mmol/L — ABNORMAL LOW (ref 22–32)
Calcium: 8.8 mg/dL — ABNORMAL LOW (ref 8.9–10.3)
Chloride: 110 mmol/L (ref 98–111)
Creatinine, Ser: 1.79 mg/dL — ABNORMAL HIGH (ref 0.44–1.00)
GFR calc Af Amer: 31 mL/min — ABNORMAL LOW (ref >60)
GFR calc non Af Amer: 27 mL/min — ABNORMAL LOW (ref >60)
Glucose, Bld: 135 mg/dL — ABNORMAL HIGH (ref 70–99)
Potassium: 3.7 mmol/L (ref 3.5–5.1)
Sodium: 142 mmol/L (ref 135–145)
Total Bilirubin: 0.7 mg/dL (ref 0.3–1.2)
Total Protein: 6.6 g/dL (ref 6.5–8.1)

## 2020-04-22 LAB — CBC
HCT: 34.3 % — ABNORMAL LOW (ref 36.0–46.0)
Hemoglobin: 11.8 g/dL — ABNORMAL LOW (ref 12.0–15.0)
MCH: 33.2 pg (ref 26.0–34.0)
MCHC: 34.4 g/dL (ref 30.0–36.0)
MCV: 96.6 fL (ref 80.0–100.0)
Platelets: 199 10*3/uL (ref 150–400)
RBC: 3.55 MIL/uL — ABNORMAL LOW (ref 3.87–5.11)
RDW: 16.1 % — ABNORMAL HIGH (ref 11.5–15.5)
WBC: 11.5 10*3/uL — ABNORMAL HIGH (ref 4.0–10.5)
nRBC: 0 % (ref 0.0–0.2)

## 2020-04-22 LAB — SARS CORONAVIRUS 2 BY RT PCR (HOSPITAL ORDER, PERFORMED IN ~~LOC~~ HOSPITAL LAB): SARS Coronavirus 2: NEGATIVE

## 2020-04-22 LAB — HEMOGLOBIN A1C
Hgb A1c MFr Bld: 6 % — ABNORMAL HIGH (ref 4.8–5.6)
Mean Plasma Glucose: 125.5 mg/dL

## 2020-04-22 LAB — TROPONIN I (HIGH SENSITIVITY)
Troponin I (High Sensitivity): 5 ng/L (ref <18)
Troponin I (High Sensitivity): 6 ng/L (ref <18)

## 2020-04-22 LAB — GLUCOSE, CAPILLARY
Glucose-Capillary: 75 mg/dL (ref 70–99)
Glucose-Capillary: 91 mg/dL (ref 70–99)

## 2020-04-22 LAB — PROTIME-INR
INR: 1.1 (ref 0.8–1.2)
Prothrombin Time: 14.1 seconds (ref 11.4–15.2)

## 2020-04-22 LAB — LIPASE, BLOOD: Lipase: 29 U/L (ref 11–51)

## 2020-04-22 MED ORDER — APIXABAN 5 MG PO TABS
5.0000 mg | ORAL_TABLET | Freq: Two times a day (BID) | ORAL | Status: DC
  Administered 2020-04-23: 5 mg via ORAL
  Filled 2020-04-22: qty 1

## 2020-04-22 MED ORDER — SODIUM CHLORIDE 0.9 % IV SOLN: INTRAVENOUS | Status: DC

## 2020-04-22 MED ORDER — ONDANSETRON HCL 4 MG PO TABS: 4.0000 mg | ORAL_TABLET | Freq: Four times a day (QID) | ORAL | Status: DC | PRN

## 2020-04-22 MED ORDER — SODIUM CHLORIDE 0.9 % IV BOLUS
500.0000 mL | Freq: Once | INTRAVENOUS | Status: AC
  Administered 2020-04-22: 500 mL via INTRAVENOUS

## 2020-04-22 MED ORDER — PRAMIPEXOLE DIHYDROCHLORIDE 0.25 MG PO TABS: 0.2500 mg | ORAL_TABLET | Freq: Every day | ORAL | Status: DC

## 2020-04-22 MED ORDER — ALBUTEROL SULFATE (2.5 MG/3ML) 0.083% IN NEBU: 2.5000 mg | INHALATION_SOLUTION | RESPIRATORY_TRACT | Status: DC | PRN

## 2020-04-22 MED ORDER — INSULIN ASPART 100 UNIT/ML ~~LOC~~ SOLN: 0.0000 [IU] | Freq: Three times a day (TID) | SUBCUTANEOUS | Status: DC

## 2020-04-22 MED ORDER — ACETAMINOPHEN 325 MG PO TABS: 650.0000 mg | ORAL_TABLET | Freq: Four times a day (QID) | ORAL | Status: DC | PRN

## 2020-04-22 MED ORDER — ONDANSETRON HCL 4 MG/2ML IJ SOLN: 4.0000 mg | Freq: Four times a day (QID) | INTRAMUSCULAR | Status: DC | PRN

## 2020-04-22 MED ORDER — ACETAMINOPHEN 650 MG RE SUPP: 650.0000 mg | Freq: Four times a day (QID) | RECTAL | Status: DC | PRN

## 2020-04-22 MED ORDER — RIVAROXABAN 10 MG PO TABS: 10.0000 mg | ORAL_TABLET | Freq: Every day | ORAL | Status: DC

## 2020-04-22 MED ORDER — POLYETHYLENE GLYCOL 3350 17 G PO PACK: 17.0000 g | PACK | Freq: Every day | ORAL | Status: DC | PRN

## 2020-04-22 MED ORDER — PRAMIPEXOLE DIHYDROCHLORIDE 0.25 MG PO TABS: 0.2500 mg | ORAL_TABLET | Freq: Every morning | ORAL | Status: DC

## 2020-04-22 MED ORDER — INSULIN ASPART 100 UNIT/ML ~~LOC~~ SOLN: 0.0000 [IU] | Freq: Every day | SUBCUTANEOUS | Status: DC

## 2020-04-22 MED ORDER — TIOTROPIUM BROMIDE MONOHYDRATE 18 MCG IN CAPS
1.0000 | ORAL_CAPSULE | Freq: Every day | RESPIRATORY_TRACT | Status: DC
  Administered 2020-04-23: 18 ug via RESPIRATORY_TRACT
  Filled 2020-04-22: qty 5

## 2020-04-22 MED ORDER — GABAPENTIN 300 MG PO CAPS: 600.0000 mg | ORAL_CAPSULE | Freq: Every day | ORAL | Status: DC

## 2020-04-22 NOTE — ED Notes (Signed)
Will test CBG when dinner tray arrives.

## 2020-04-22 NOTE — ED Triage Notes (Signed)
Per EMT report, patient and her brother walked 15 miles and back from a restaurant. Patient walked with a walker. Patient c/o mild shortness of breath on the way back and mid-sternal chest pain upon arrival to the house. Patient could not describe chest pain. Patient arrived A&O x4, verbal, NAD.

## 2020-04-22 NOTE — ED Notes (Signed)
Lab tech at bedside to draw labs

## 2020-04-22 NOTE — ED Notes (Signed)
Report given to Rico Junker RN

## 2020-04-22 NOTE — H&P (Addendum)
[facility] [place] NAME: Tracy Dickerson    MR#:  [id number]  DATE OF BIRTH:  [birth year]  DATE OF ADMISSION:  04/22/2020  PRIMARY CARE PHYSICIAN: Boykin Nearing, MD   REQUESTING/REFERRING PHYSICIAN: Delman Kitten  Patient coming from :Home   CHIEF COMPLAINT:  came in with feeling lightheaded dizzy after she walked 1.5 miles from the restaurant back to her home with her brother  HISTORY OF PRESENT ILLNESS:  Tracy Dickerson  is a 79 y.o. female with a known history of paroxysmal a fib on oral anticoagulation, Gerd, diabetes with microalbuminuria, hypertension, obesity comes to the emergency room after she felt dizzy lightheaded and mild chest pain initially after she walked 1.5 miles from [place] where she went for lunch with her brother.  Patient states she normally doesn't walk however decided today to work with her brother in the heat. She do not have water to drink on her way back. Felt very drained out when she went home came to the emergency room there after  ED course: patient was found to be hypotensive with blood pressure in the 90s. She was mildly tachycardic. During my evaluation blood pressure was 11 9/72. Patient feels better being in the cool environment and drinking water. She received bolus of 250 mL fluid. Creatinine was 1.79 baseline creatinine .9  patient is being admitted with acute renal failure secondary to heat exhaustion due to walking 1 1/2 mile in the hot weather.  Denies fever dysuria cough shortness of breath. PAST MEDICAL HISTORY:   Past Medical History:  Diagnosis Date  . Cancer ([place])    Breast and lung  . Diabetes mellitus without complication ([place])   . GERD (gastroesophageal reflux disease)   . Heart disease   . Hypertension     PAST SURGICAL HISTOIRY:   Past Surgical History:  Procedure Laterality Date  . ABDOMINAL HYSTERECTOMY    . APPENDECTOMY    . BREAST SURGERY     breast cancer LEFT   . OPEN REDUCTION INTERNAL FIXATION (ORIF) TIBIA/FIBULA FRACTURE Left 02/03/2018   Procedure: OPEN REDUCTION INTERNAL FIXATION (ORIF) DISTAL TIBIA FRACTURE;  Surgeon: Corky Mull, MD;  Location: [facility] ORS;  Service: Orthopedics;  Laterality: Left;  ankle/lower leg  . TONSILLECTOMY      SOCIAL HISTORY:   Social History   Tobacco Use  . Smoking status: Former Research scientist (life sciences)  . Smokeless tobacco: Never Used  Substance Use Topics  . Alcohol use: Not Currently    FAMILY HISTORY:   Family History  Problem Relation Age of Onset  . Cancer Mother     DRUG ALLERGIES:   Allergies  Allergen Reactions  . Oxycodone   . Percocet [Oxycodone-Acetaminophen] Itching  . Penicillins Rash    Has patient had a PCN reaction causing immediate rash, facial/tongue/throat swelling, SOB or lightheadedness with hypotension: Yes Has patient had a PCN reaction causing severe rash involving mucus membranes or skin necrosis: Unknown Has patient had a PCN reaction that required hospitalization: Unknown Has patient had a PCN reaction occurring within the last 10 years: No If all of the above answers are "NO", then may proceed with Cephalosporin use.    REVIEW OF SYSTEMS:  Review of Systems  Constitutional: Positive for malaise/fatigue. Negative for chills, fever and weight loss.  HENT: Negative for ear discharge, ear pain and nosebleeds.   Eyes: Negative for blurred vision, pain and discharge.  Respiratory: Negative for sputum production, shortness of breath, wheezing  and stridor.   Cardiovascular: Negative for chest pain, palpitations, orthopnea and PND.  Gastrointestinal: Negative for abdominal pain, diarrhea, nausea and vomiting.  Genitourinary: Negative for frequency and urgency.  Musculoskeletal: Negative for back pain and joint pain.  Neurological: Positive for dizziness and weakness. Negative for sensory change, speech change and focal weakness.  Psychiatric/Behavioral: Negative for depression and  hallucinations. The patient is not nervous/anxious.      MEDICATIONS AT HOME:   Prior to Admission medications   Medication Sig Start Date End Date Taking? Authorizing Provider  albuterol (PROAIR HFA) 108 (90 Base) MCG/ACT inhaler Inhale 2 puffs into the lungs every 4 (four) hours as needed for wheezing or shortness of breath.    Yes [provider]  apixaban (ELIQUIS) 5 MG TABS tablet Take 5 mg by mouth 2 (two) times daily. 06/17/18  Yes [provider]  diphenhydramine-acetaminophen (TYLENOL PM) 25-500 MG TABS tablet Take 1-2 tablets by mouth at bedtime as needed.   Yes [provider]  gabapentin (NEURONTIN) 300 MG capsule Take 600 mg by mouth at bedtime.   Yes [provider]  lisinopril-hydrochlorothiazide (ZESTORETIC) 10-12.5 MG tablet Take 1 tablet by mouth daily. 03/10/20  Yes [provider]  metFORMIN (GLUCOPHAGE) 1000 MG tablet Take 1,000 mg by mouth 2 (two) times daily with a meal.   Yes [provider]  pramipexole (MIRAPEX) 0.25 MG tablet Take 0.25 mg by mouth at bedtime. 12/23/16  Yes [provider]  SPIRIVA HANDIHALER 18 MCG inhalation capsule Place 1 capsule into inhaler and inhale daily. 01/26/18  Yes [provider]  triamterene-hydrochlorothiazide (MAXZIDE-25) 37.5-25 MG tablet Take 1 tablet by mouth daily.   Yes [provider]      VITAL SIGNS:  Blood pressure (!) 95/52, pulse 103, temperature 98.2 F (36.8 C), temperature source Oral, resp. rate 18, height 5\' 6"  (1.676 m), weight (!) 102.1 kg.  PHYSICAL EXAMINATION:  GENERAL:  79 y.o.-year-old patient lying in the bed with no acute distress. obese EYES: Pupils equal, round, reactive to light and accommodation. No scleral icterus.  HEENT: Head atraumatic, normocephalic. Oropharynx and nasopharynx clear. Oral mucosa dry NECK:  Supple, no jugular venous distention. No thyroid enlargement, no tenderness.  LUNGS: Normal breath sounds bilaterally,  no wheezing, rales,rhonchi or crepitation. No use of accessory muscles of respiration.  CARDIOVASCULAR: S1, S2 normal. No murmurs, rubs, or gallops. Mild tachy+ ABDOMEN: Soft, nontender, nondistended. Bowel sounds present. No organomegaly or mass.  EXTREMITIES: No pedal edema, cyanosis, or clubbing.  NEUROLOGIC: Cranial nerves II through XII are intact. Muscle strength 5/5 in all extremities. Sensation intact. Gait not checked.  PSYCHIATRIC: The patient is alert and oriented x 3.  SKIN: No obvious rash, lesion, or ulcer.   LABORATORY PANEL:   CBC Recent Labs  Lab 04/22/20 1315  WBC 11.5*  HGB 11.8*  HCT 34.3*  PLT 199   ------------------------------------------------------------------------------------------------------------------  Chemistries  Recent Labs  Lab 04/22/20 1315  NA 142  K 3.7  CL 110  CO2 21*  GLUCOSE 135*  BUN 25*  CREATININE 1.79*  CALCIUM 8.8*  AST 16  ALT 10  ALKPHOS 66  BILITOT 0.7   ------------------------------------------------------------------------------------------------------------------  Cardiac Enzymes No results for input(s): TROPONINI in the last 168 hours. ------------------------------------------------------------------------------------------------------------------  RADIOLOGY:  CT Head Wo Contrast  Result Date: 04/22/2020 CLINICAL DATA:  Tension headache EXAM: CT HEAD WITHOUT CONTRAST TECHNIQUE: Contiguous axial images were obtained from the base of the skull through the vertex without intravenous contrast. COMPARISON:  02/08/2016 FINDINGS:  Brain: No evidence of acute infarction, hemorrhage, hydrocephalus, extra-axial collection or mass lesion/mass effect. Scattered low-density changes within the periventricular and subcortical white matter compatible with chronic microvascular ischemic change. Mild diffuse cerebral volume loss. Vascular: Atherosclerotic calcifications involving the large vessels of the skull base. No unexpected  hyperdense vessel. Skull: Normal. Negative for fracture or focal lesion. Sinuses/Orbits: No acute finding. Other: None. IMPRESSION: 1. No acute intracranial findings. 2. Mild chronic microvascular ischemic change and cerebral volume loss. Electronically Signed   By: Davina Poke D.O.   On: 04/22/2020 14:28   DG Chest Portable 1 View  Result Date: 04/22/2020 CLINICAL DATA:  Chest pain, shortness of breath EXAM: PORTABLE CHEST 1 VIEW COMPARISON:  None. FINDINGS: Stable cardiac and mediastinal contours. Surgical clips in the left axilla suggest prior axillary nodal dissection. Peripheral and basilar interstitial reticular airspace opacities appears similar compared to prior and likely reflect underlying pulmonary fibrosis. No new airspace opacity, pleural effusion, pulmonary edema or pneumothorax. No acute osseous abnormality. Chronic bronchitic changes are stable. IMPRESSION: Stable chest x-ray without evidence of acute cardiopulmonary process. 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 Electronically Signed   By: Jacqulynn Cadet M.D.   On: 04/22/2020 13:27    EKG:    IMPRESSION AND PLAN:   Tracy Dickerson  is a 79 y.o. female with a known history of paroxysmal a fib on oral anticoagulation, Gerd, diabetes with microalbuminuria, hypertension, obesity comes to the emergency room after she felt dizzy lightheaded and mild chest pain initially after she walked 1.5 miles from [place] where she went for lunch with her brother  Acute renal failure secondary to heat exhaustion from walking about 1 1/2 mile this afternoon without hydration enroute -admit for overnight observation -continue IV fluids -patient asked to continue oral hydration with fluids -will avoid nephrotoxic agents -hold BP meds -came in with creatinine 1.79 -baseline creatinine 0.8 in June 2021 -check troponin times one. Patient did have some chest discomfort earlier which is resolved. So far troponin  negative. EKG no acute changes  Relative hypotension secondary to dehydration -hold BP meds -blood pressure improved with IV fluids  Paroxysmal atrial fibrillation -continue anticoagulation with eliquis  Type II diabetes with microalbuminuria, well-controlled -sliding scale insulin -hold metformin due to elevated creatinine  Jerrye Bushy continue PPI    Family Communication : none Consults : none Code Status : full DVT prophylaxis : eliquis  TOTAL TIME TAKING CARE OF THIS PATIENT: **45* minutes.    [name] M.D  [facility]     CC: Primary care physician; Boykin Nearing, MD

## 2020-04-22 NOTE — ED Provider Notes (Signed)
[facility] Emergency Department Provider Note   ____________________________________________   First MD Initiated Contact with Patient 04/22/20 1302     (approximate)  I have reviewed the triage vital signs and the nursing notes.   HISTORY  Chief Complaint Chest Pain    HPI Tracy Dickerson is a 79 y.o. female for evaluation of chest pain  Patient reports that she was just finishing a mile and a half walk with her brother.  She walks with her walker slowly back and forth to get food.  She went to a restaurant, and then when she got home she started feeling chest pain.  She reports that hard to describe it sort feels like a sharp feeling in the middle of her chest, radiates slightly towards her upper chest and back.  No fevers or chills.  No recent illness.  She does have a history of atrial fibrillation and takes Xarelto regularly.  She reports she cannot take aspirin, but instructed not to do so by her physicians  EMS arrived, patient felt like she was going to pass out, but by the time paramedics arrived she was able to walk herself to the ambulance.  Denies shortness of breath or cough.  No leg swelling.  Reports the pain is now very mild but was 10 out of 10 in nature when she called 911  No associate abdominal pain except she is having some chronic pain in her upper abdomen for several months, nothing new.  No vomiting.   Past Medical History:  Diagnosis Date  . Cancer ([place])    Breast and lung  . Diabetes mellitus without complication ([place])   . GERD (gastroesophageal reflux disease)   . Heart disease   . Hypertension     Patient Active Problem List   Diagnosis Date Noted  . Acute renal failure ([place]) 04/22/2020  . Hyperkalemia   . Septic shock ([place])   . Sepsis due to urinary tract infection ([place]) 03/13/2018  . Acute kidney injury superimposed on chronic kidney disease ([place]) 02/13/2018  . Closed extra-articular fracture of distal tibia  02/02/2018  . Pneumonia 10/29/2017  . Hypotension 01/26/2017    Past Surgical History:  Procedure Laterality Date  . ABDOMINAL HYSTERECTOMY    . APPENDECTOMY    . BREAST SURGERY     breast cancer LEFT  . OPEN REDUCTION INTERNAL FIXATION (ORIF) TIBIA/FIBULA FRACTURE Left 02/03/2018   Procedure: OPEN REDUCTION INTERNAL FIXATION (ORIF) DISTAL TIBIA FRACTURE;  Surgeon: Corky Mull, MD;  Location: [facility] ORS;  Service: Orthopedics;  Laterality: Left;  ankle/lower leg  . TONSILLECTOMY      Prior to Admission medications   Medication Sig Start Date End Date Taking? Authorizing Provider  albuterol (PROAIR HFA) 108 (90 Base) MCG/ACT inhaler Inhale 2 puffs into the lungs every 4 (four) hours as needed for wheezing or shortness of breath.    Yes [provider]  apixaban (ELIQUIS) 5 MG TABS tablet Take 5 mg by mouth 2 (two) times daily. 06/17/18  Yes [provider]  diphenhydramine-acetaminophen (TYLENOL PM) 25-500 MG TABS tablet Take 1-2 tablets by mouth at bedtime as needed (pain or sleep).    Yes [provider]  gabapentin (NEURONTIN) 300 MG capsule Take 600 mg by mouth at bedtime.   Yes [provider]  lisinopril-hydrochlorothiazide (ZESTORETIC) 10-12.5 MG tablet Take 1 tablet by mouth daily. 03/10/20  Yes [provider]  metFORMIN (GLUCOPHAGE) 1000 MG tablet Take 1,000 mg by mouth 2 (two) times  daily with a meal.   Yes [provider]  pramipexole (MIRAPEX) 0.25 MG tablet Take 0.25 mg by mouth at bedtime. 12/23/16  Yes [provider]  SPIRIVA HANDIHALER 18 MCG inhalation capsule Place 1 capsule into inhaler and inhale daily. 01/26/18  Yes [provider]  triamterene-hydrochlorothiazide (MAXZIDE-25) 37.5-25 MG tablet Take 1 tablet by mouth daily.   Yes [provider]    Allergies Oxycodone, Percocet [oxycodone-acetaminophen], and Penicillins  Family History  Problem Relation Age of Onset  . Cancer Mother      Social History Social History   Tobacco Use  . Smoking status: Former Research scientist (life sciences)  . Smokeless tobacco: Never Used  Vaping Use  . Vaping Use: Never used  Substance Use Topics  . Alcohol use: Not Currently  . Drug use: No    Review of Systems Constitutional: No fever/chills.  Eyes: No visual changes. ENT: No sore throat.  Feels thirsty. Cardiovascular: See HPI Respiratory: Denies shortness of breath. Gastrointestinal: No abdominal pain except as noted in HPI.   Genitourinary: Negative for dysuria. Musculoskeletal: Negative for back pain. Skin: Negative for rash. Neurological: Negative for areas of focal weakness or numbness.  Reports a mild headache.    ____________________________________________   PHYSICAL EXAM:  VITAL SIGNS: ED Triage Vitals  Enc Vitals Group     BP 04/22/20 1303 (!) 95/52     Pulse Rate 04/22/20 1303 103     Resp 04/22/20 1303 18     Temp 04/22/20 1303 98.2 F (36.8 C)     Temp Source 04/22/20 1303 Oral     SpO2 --      Weight 04/22/20 1308 (!) 225 lb (102.1 kg)     Height 04/22/20 1308 5\' 6"  (1.676 m)     Head Circumference --      Peak Flow --      Pain Score 04/22/20 1307 9     Pain Loc --      Pain Edu? --      Excl. in [place]? --     Constitutional: Alert and oriented. Well appearing and in no acute distress. Eyes: Conjunctivae are normal. Head: Atraumatic. Nose: No congestion/rhinnorhea. Mouth/Throat: Mucous membranes are not evaluated as patient wearing a mask due to Covid preventive measures. Neck: No stridor.  Cardiovascular: Slight tachycardia, slight irregularity.  Grossly normal heart sounds.  Good peripheral circulation. Respiratory: Normal respiratory effort.  No retractions. Lungs CTAB. Gastrointestinal: Soft and nontender. No distention. Musculoskeletal: No lower extremity tenderness nor edema.  Increased skin turgor Neurologic:  Normal speech and language. No gross focal neurologic deficits are appreciated.  Skin:  Skin  is warm, dry and intact. No rash noted. Psychiatric: Mood and affect are normal. Speech and behavior are normal.  ____________________________________________   LABS (all labs ordered are listed, but only abnormal results are displayed)  Labs Reviewed  CBC - Abnormal; Notable for the following components:      Result Value   WBC 11.5 (*)    RBC 3.55 (*)    Hemoglobin 11.8 (*)    HCT 34.3 (*)    RDW 16.1 (*)    All other components within normal limits  COMPREHENSIVE METABOLIC PANEL - Abnormal; Notable for the following components:   CO2 21 (*)    Glucose, Bld 135 (*)    BUN 25 (*)    Creatinine, Ser 1.79 (*)    Calcium 8.8 (*)    GFR calc non Af Amer 27 (*)    GFR calc  Af Amer 31 (*)    All other components within normal limits  SARS CORONAVIRUS 2 BY RT PCR (HOSPITAL ORDER, [place] [facility])  LIPASE, BLOOD  PROTIME-INR  HEMOGLOBIN A1C  TROPONIN I (HIGH SENSITIVITY)  TROPONIN I (HIGH SENSITIVITY)   ____________________________________________  EKG  Reviewed interpreted at 1310 Heart rate 110 QRS 100 QTc 440 Irregular, occasional areas where P waves are seen preceding QRS complexes including V3, however overall without irregularity suspect paroxysmal A. fib versus sinus rhythm with frequent PACs ____________________________________________  RADIOLOGY  CT Head Wo Contrast  Result Date: 04/22/2020 CLINICAL DATA:  Tension headache EXAM: CT HEAD WITHOUT CONTRAST TECHNIQUE: Contiguous axial images were obtained from the base of the skull through the vertex without intravenous contrast. COMPARISON:  02/08/2016 FINDINGS: Brain: No evidence of acute infarction, hemorrhage, hydrocephalus, extra-axial collection or mass lesion/mass effect. Scattered low-density changes within the periventricular and subcortical white matter compatible with chronic microvascular ischemic change. Mild diffuse cerebral volume loss. Vascular: Atherosclerotic calcifications  involving the large vessels of the skull base. No unexpected hyperdense vessel. Skull: Normal. Negative for fracture or focal lesion. Sinuses/Orbits: No acute finding. Other: None. IMPRESSION: 1. No acute intracranial findings. 2. Mild chronic microvascular ischemic change and cerebral volume loss. Electronically Signed   By: Davina Poke D.O.   On: 04/22/2020 14:28   DG Chest Portable 1 View  Result Date: 04/22/2020 CLINICAL DATA:  Chest pain, shortness of breath EXAM: PORTABLE CHEST 1 VIEW COMPARISON:  None. FINDINGS: Stable cardiac and mediastinal contours. Surgical clips in the left axilla suggest prior axillary nodal dissection. Peripheral and basilar interstitial reticular airspace opacities appears similar compared to prior and likely reflect underlying pulmonary fibrosis. No new airspace opacity, pleural effusion, pulmonary edema or pneumothorax. No acute osseous abnormality. Chronic bronchitic changes are stable. IMPRESSION: Stable chest x-ray without evidence of acute cardiopulmonary process. 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 Electronically Signed   By: Jacqulynn Cadet M.D.   On: 04/22/2020 13:27    CT head reviewed negative for acute, no bleeding.  Chest x-ray stable findings no acute findings noted ____________________________________________   PROCEDURES  Procedure(s) performed: None  Procedures  Critical Care performed: No  ____________________________________________   INITIAL IMPRESSION / ASSESSMENT AND PLAN / ED COURSE  Pertinent labs & imaging results that were available during my care of the patient were reviewed by me and considered in my medical decision making (see chart for details).   Patient presents for fatigue lightheadedness feeling of near syncope after returning from about an hour and a half of walking in the heat after lunch.  She has limited mobility use of a walker at baseline, and based on her clinical history mild  hypotension elevated AKI suspect dehydration likely at play.  She does also report some chest discomfort atypical in nature, relieved in the ER without intervention other than fluid hydration.  Reassuring troponin, doubt ACS, though certainly angina could still be present, but given her symptoms I suspect dehydration is likely driving factor of her presentation today.  She is on anticoagulation oral, and given this I think the likelihood of pulmonary embolism would be extremely low, she does not have pleuritic symptoms  ----------------------------------------- 3:55 PM on 04/22/2020 -----------------------------------------  Patient reports she is feeling much better.  Her blood pressure is normalized.  She is resting comfortably in no distress at this time.  Admit to the hospital for further management of her chest pain as well as rehydration and AKI which appears to  be likely prerenal in nature based on clinical history  Admission discussed with Dr. [name]      ____________________________________________   FINAL CLINICAL IMPRESSION(S) / ED DIAGNOSES  Final diagnoses:  Atypical chest pain  AKI (acute kidney injury) ([place])  Dehydration, moderate        Note:  This document was prepared using Dragon voice recognition software and may include unintentional dictation errors       Delman Kitten, MD 04/22/20 1556

## 2020-04-23 DIAGNOSIS — E860 Dehydration: Secondary | ICD-10-CM | POA: Diagnosis not present

## 2020-04-23 DIAGNOSIS — R0789 Other chest pain: Secondary | ICD-10-CM | POA: Diagnosis not present

## 2020-04-23 DIAGNOSIS — N179 Acute kidney failure, unspecified: Secondary | ICD-10-CM | POA: Diagnosis not present

## 2020-04-23 LAB — GLUCOSE, CAPILLARY
Glucose-Capillary: 109 mg/dL — ABNORMAL HIGH (ref 70–99)
Glucose-Capillary: 111 mg/dL — ABNORMAL HIGH (ref 70–99)

## 2020-04-23 LAB — BASIC METABOLIC PANEL
Anion gap: 8 (ref 5–15)
BUN: 24 mg/dL — ABNORMAL HIGH (ref 8–23)
CO2: 23 mmol/L (ref 22–32)
Calcium: 8.7 mg/dL — ABNORMAL LOW (ref 8.9–10.3)
Chloride: 108 mmol/L (ref 98–111)
Creatinine, Ser: 1.4 mg/dL — ABNORMAL HIGH (ref 0.44–1.00)
GFR calc Af Amer: 42 mL/min — ABNORMAL LOW (ref >60)
GFR calc non Af Amer: 36 mL/min — ABNORMAL LOW (ref >60)
Glucose, Bld: 109 mg/dL — ABNORMAL HIGH (ref 70–99)
Potassium: 3.7 mmol/L (ref 3.5–5.1)
Sodium: 139 mmol/L (ref 135–145)

## 2020-04-23 LAB — CREATININE, SERUM
Creatinine, Ser: 1.29 mg/dL — ABNORMAL HIGH (ref 0.44–1.00)
GFR calc Af Amer: 47 mL/min — ABNORMAL LOW (ref >60)
GFR calc non Af Amer: 40 mL/min — ABNORMAL LOW (ref >60)

## 2020-04-23 MED ORDER — SPIRIVA HANDIHALER 18 MCG IN CAPS: 1.0000 | ORAL_CAPSULE | Freq: Every day | RESPIRATORY_TRACT | 2 refills | Status: DC

## 2020-04-23 NOTE — Discharge Summary (Signed)
Tracy Dickerson at [place] NAME: Tracy Dickerson    MR#:  [id number]  DATE OF BIRTH:  05-21-1941  DATE OF ADMISSION:  04/22/2020 ADMITTING PHYSICIAN: [name], MD  DATE OF DISCHARGE: 04/23/2020  PRIMARY CARE PHYSICIAN: Boykin Nearing, MD    ADMISSION DIAGNOSIS:  Acute renal failure (HCC) [N17.9] Atypical chest pain [R07.89] Dehydration, moderate [E86.0] AKI (acute kidney injury) ([place]) [N17.9]  DISCHARGE DIAGNOSIS:  Acute Renal failure due to dehydration  SECONDARY DIAGNOSIS:   Past Medical History:  Diagnosis Date  . Cancer ([place])    Breast and lung  . Diabetes mellitus without complication ([place])   . GERD (gastroesophageal reflux disease)   . Heart disease   . Hypertension     HOSPITAL COURSE:  Tracy Dickerson  is a 79 y.o. female with a known history of paroxysmal a fib on oral anticoagulation, Gerd, diabetes with microalbuminuria, hypertension, obesity comes to the emergency room after she felt dizzy lightheaded and mild chest pain initially after she walked 1.5 miles from [place] where she went for lunch with her brother Tracy Dickerson  is a 79 y.o. female with a known history of paroxysmal a fib on oral anticoagulation, Gerd, diabetes with microalbuminuria, hypertension, obesity comes to the emergency room after she felt dizzy lightheaded and mild chest pain initially after she walked 1.5 miles from [place] where she went for lunch with her brother  Acute renal failure secondary to heat exhaustion from walking about 1 1/2 mile this afternoon without hydration enroute -received IV fluids -patient asked to continue oral hydration with fluids -will avoid nephrotoxic agents -came in with creatinine 1.79--1.2 -baseline creatinine 0.8 in June 2021 -check troponin times one. Patient did have some chest discomfort earlier which is resolved. So far troponin negative. EKG no acute changes-- patient chest pain-free -overall  feels better.  Relative hypotension secondary to dehydration -hold BP meds -blood pressure improved with IV fluids  Paroxysmal atrial fibrillation -continue anticoagulation with eliquis  Type II diabetes with microalbuminuria, well-controlled -sliding scale insulin resume home meds at discharge  Jerrye Bushy continue PPI    Family Communication : none Consults : none Code Status : full DVT prophylaxis : eliquis  Patient hemodynamically stable. Overall feels better. Will discharge her to home. Patient agreeable CONSULTS OBTAINED:    DRUG ALLERGIES:   Allergies  Allergen Reactions  . Oxycodone   . Percocet [Oxycodone-Acetaminophen] Itching  . Penicillins Rash    Has patient had a PCN reaction causing immediate rash, facial/tongue/throat swelling, SOB or lightheadedness with hypotension: Yes Has patient had a PCN reaction causing severe rash involving mucus membranes or skin necrosis: Unknown Has patient had a PCN reaction that required hospitalization: Unknown Has patient had a PCN reaction occurring within the last 10 years: No If all of the above answers are "NO", then may proceed with Cephalosporin use.    DISCHARGE MEDICATIONS:   Allergies as of 04/23/2020      Reactions   Oxycodone    Percocet [oxycodone-acetaminophen] Itching   Penicillins Rash   Has patient had a PCN reaction causing immediate rash, facial/tongue/throat swelling, SOB or lightheadedness with hypotension: Yes Has patient had a PCN reaction causing severe rash involving mucus membranes or skin necrosis: Unknown Has patient had a PCN reaction that required hospitalization: Unknown Has patient had a PCN reaction occurring within the last 10 years: No If all of the above answers are "NO", then may proceed with Cephalosporin use.  Medication List    STOP taking these medications   triamterene-hydrochlorothiazide 37.5-25 MG tablet Commonly known as: MAXZIDE-25     TAKE these medications    apixaban 5 MG Tabs tablet Commonly known as: ELIQUIS Take 5 mg by mouth 2 (two) times daily.   diphenhydramine-acetaminophen 25-500 MG Tabs tablet Commonly known as: TYLENOL PM Take 1-2 tablets by mouth at bedtime as needed (pain or sleep).   gabapentin 300 MG capsule Commonly known as: NEURONTIN Take 600 mg by mouth at bedtime.   lisinopril-hydrochlorothiazide 10-12.5 MG tablet Commonly known as: ZESTORETIC Take 1 tablet by mouth daily.   metFORMIN 1000 MG tablet Commonly known as: GLUCOPHAGE Take 1,000 mg by mouth 2 (two) times daily with a meal.   pramipexole 0.25 MG tablet Commonly known as: MIRAPEX Take 0.25 mg by mouth at bedtime.   ProAir HFA 108 (90 Base) MCG/ACT inhaler Generic drug: albuterol Inhale 2 puffs into the lungs every 4 (four) hours as needed for wheezing or shortness of breath.   Spiriva HandiHaler 18 MCG inhalation capsule Generic drug: tiotropium Place 1 capsule (18 mcg total) into inhaler and inhale daily. What changed: how much to take       If you experience worsening of your admission symptoms, develop shortness of breath, life threatening emergency, suicidal or homicidal thoughts you must seek medical attention immediately by calling 911 or calling your MD immediately  if symptoms less severe.  You Must read complete instructions/literature along with all the possible adverse reactions/side effects for all the Medicines you take and that have been prescribed to you. Take any new Medicines after you have completely understood and accept all the possible adverse reactions/side effects.   Please note  You were cared for by a hospitalist during your hospital stay. If you have any questions about your discharge medications or the care you received while you were in the hospital after you are discharged, you can call the unit and asked to speak with the hospitalist on call if the hospitalist that took care of you is not available. Once you are  discharged, your primary care physician will handle any further medical issues. Please note that NO REFILLS for any discharge medications will be authorized once you are discharged, as it is imperative that you return to your primary care physician (or establish a relationship with a primary care physician if you do not have one) for your aftercare needs so that they can reassess your need for medications and monitor your lab values. Today   SUBJECTIVE   I feel a lot better  VITAL SIGNS:  Blood pressure (!) 112/56, pulse 74, temperature 97.7 F (36.5 C), temperature source Oral, resp. rate 16, height 5\' 6"  (1.676 m), weight (!) 102.1 kg, SpO2 96 %.  I/O:    Intake/Output Summary (Last 24 hours) at 04/23/2020 1255 Last data filed at 04/23/2020 0900 Gross per 24 hour  Intake 1718.54 ml  Output 1000 ml  Net 718.54 ml    PHYSICAL EXAMINATION:  GENERAL:  79 y.o.-year-old patient lying in the bed with no acute distress. Obese EYES: Pupils equal, round, reactive to light and accommodation. No scleral icterus.  HEENT: Head atraumatic, normocephalic. Oropharynx and nasopharynx clear.  NECK:  Supple, no jugular venous distention. No thyroid enlargement, no tenderness.  LUNGS: Normal breath sounds bilaterally, no wheezing, rales,rhonchi or crepitation. No use of accessory muscles of respiration.  CARDIOVASCULAR: S1, S2 normal. No murmurs, rubs, or gallops.  ABDOMEN: Soft, non-tender, non-distended. Bowel sounds present. No  organomegaly or mass.  EXTREMITIES: No pedal edema, cyanosis, or clubbing.  NEUROLOGIC: Cranial nerves II through XII are intact. Muscle strength 5/5 in all extremities. Sensation intact. Gait not checked.  PSYCHIATRIC: The patient is alert and oriented x 3.  SKIN: No obvious rash, lesion, or ulcer.   DATA REVIEW:   CBC  Recent Labs  Lab 04/22/20 1315  WBC 11.5*  HGB 11.8*  HCT 34.3*  PLT 199    Chemistries  Recent Labs  Lab 04/22/20 1315 04/22/20 1315  04/23/20 0411 04/23/20 0411 04/23/20 1155  NA 142   < > 139  --   --   K 3.7   < > 3.7  --   --   CL 110   < > 108  --   --   CO2 21*   < > 23  --   --   GLUCOSE 135*   < > 109*  --   --   BUN 25*   < > 24*  --   --   CREATININE 1.79*   < > 1.40*   < > 1.29*  CALCIUM 8.8*   < > 8.7*  --   --   AST 16  --   --   --   --   ALT 10  --   --   --   --   ALKPHOS 66  --   --   --   --   BILITOT 0.7  --   --   --   --    < > = values in this interval not displayed.    Microbiology Results   Recent Results (from the past 240 hour(s))  SARS Coronavirus 2 by RT PCR (hospital order, performed in [facility] hospital lab) Nasopharyngeal Nasopharyngeal Swab     Status: None   Collection Time: 04/22/20  4:04 PM   Specimen: Nasopharyngeal Swab  Result Value Ref Range Status   SARS Coronavirus 2 NEGATIVE NEGATIVE Final    Comment: (NOTE) SARS-CoV-2 target nucleic acids are NOT DETECTED.  The SARS-CoV-2 RNA is generally detectable in upper and lower respiratory specimens during the acute phase of infection. The lowest concentration of SARS-CoV-2 viral copies this assay can detect is 250 copies / mL. A negative result does not preclude SARS-CoV-2 infection and should not be used as the sole basis for treatment or other patient management decisions.  A negative result may occur with improper specimen collection / handling, submission of specimen other than nasopharyngeal swab, presence of viral mutation(s) within the areas targeted by this assay, and inadequate number of viral copies (<250 copies / mL). A negative result must be combined with clinical observations, patient history, and epidemiological information.  Fact Sheet for Patients:   StrictlyIdeas.no  Fact Sheet for Healthcare Providers: BankingDealers.co.za  This test is not yet approved or  cleared by the [place] FDA and has been authorized for detection and/or diagnosis of  SARS-CoV-2 by FDA under an Emergency Use Authorization (EUA).  This EUA will remain in effect (meaning this test can be used) for the duration of the COVID-19 declaration under Section 564(b)(1) of the Act, 21 U.S.C. section 360bbb-3(b)(1), unless the authorization is terminated or revoked sooner.  Performed at [facility], [address] [place], [place] [address]     RADIOLOGY:  CT Head Wo Contrast  Result Date: 04/22/2020 CLINICAL DATA:  Tension headache EXAM: CT HEAD WITHOUT CONTRAST TECHNIQUE: Contiguous axial images were obtained from the base  of the skull through the vertex without intravenous contrast. COMPARISON:  02/08/2016 FINDINGS: Brain: No evidence of acute infarction, hemorrhage, hydrocephalus, extra-axial collection or mass lesion/mass effect. Scattered low-density changes within the periventricular and subcortical white matter compatible with chronic microvascular ischemic change. Mild diffuse cerebral volume loss. Vascular: Atherosclerotic calcifications involving the large vessels of the skull base. No unexpected hyperdense vessel. Skull: Normal. Negative for fracture or focal lesion. Sinuses/Orbits: No acute finding. Other: None. IMPRESSION: 1. No acute intracranial findings. 2. Mild chronic microvascular ischemic change and cerebral volume loss. Electronically Signed   By: Davina Poke D.O.   On: 04/22/2020 14:28   DG Chest Portable 1 View  Result Date: 04/22/2020 CLINICAL DATA:  Chest pain, shortness of breath EXAM: PORTABLE CHEST 1 VIEW COMPARISON:  None. FINDINGS: Stable cardiac and mediastinal contours. Surgical clips in the left axilla suggest prior axillary nodal dissection. Peripheral and basilar interstitial reticular airspace opacities appears similar compared to prior and likely reflect underlying pulmonary fibrosis. No new airspace opacity, pleural effusion, pulmonary edema or pneumothorax. No acute osseous abnormality. Chronic bronchitic changes are  stable. IMPRESSION: Stable chest x-ray without evidence of acute cardiopulmonary process. 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 Electronically Signed   By: Jacqulynn Cadet M.D.   On: 04/22/2020 13:27     CODE STATUS:     Code Status Orders  (From admission, onward)         Start     Ordered   04/22/20 2123  Full code  Continuous        04/22/20 2123        Code Status History    Date Active Date Inactive Code Status Order ID Comments User Context   03/13/2018 2020 03/20/2018 1619 Full Code [id number]  Saundra Shelling, MD ED   02/13/2018 2026 02/16/2018 1637 Full Code [id number]  Salary, Avel Peace, MD Inpatient   02/02/2018 2159 02/03/2018 2049 Full Code [id number]  Poggi, Marshall Cork, MD Inpatient   10/29/2017 1816 10/31/2017 1816 DNR [id number]  Hillary Bow, MD ED   01/26/2017 2250 01/27/2017 1427 Full Code [id number]  Henreitta Leber, MD ED   Advance Care Planning Activity       TOTAL TIME TAKING CARE OF THIS PATIENT: *35* minutes.    [name] M.D  [facility]    CC: Primary care physician; Boykin Nearing, MD

## 2020-04-23 NOTE — Plan of Care (Signed)
The patient has been discharged. IV has been removed. No falls. PT has evaluated the patient. Education provided and the patient has been informed of making and appointment with his primacy care provider in a week. Vital stables and the patient is on room air.  Problem: Education: Goal: Knowledge of General Education information will improve Description: Including pain rating scale, medication(s)/side effects and non-pharmacologic comfort measures Outcome: Adequate for Discharge   Problem: Health Behavior/Discharge Planning: Goal: Ability to manage health-related needs will improve Outcome: Adequate for Discharge   Problem: Clinical Measurements: Goal: Ability to maintain clinical measurements within normal limits will improve Outcome: Adequate for Discharge Goal: Will remain free from infection Outcome: Adequate for Discharge Goal: Diagnostic test results will improve Outcome: Adequate for Discharge Goal: Respiratory complications will improve Outcome: Adequate for Discharge Goal: Cardiovascular complication will be avoided Outcome: Adequate for Discharge   Problem: Activity: Goal: Risk for activity intolerance will decrease Outcome: Adequate for Discharge   Problem: Nutrition: Goal: Adequate nutrition will be maintained Outcome: Adequate for Discharge   Problem: Coping: Goal: Level of anxiety will decrease Outcome: Adequate for Discharge   Problem: Pain Managment: Goal: General experience of comfort will improve Outcome: Adequate for Discharge   Problem: Safety: Goal: Ability to remain free from injury will improve Outcome: Adequate for Discharge   Problem: Skin Integrity: Goal: Risk for impaired skin integrity will decrease Outcome: Adequate for Discharge

## 2020-04-23 NOTE — Evaluation (Signed)
Physical Therapy Evaluation Patient Details Name: Tracy Dickerson MRN: [id number] DOB: Feb 19, 1941 Today's Date: 04/23/2020   History of Present Illness  Tracy Dickerson  is a 79 y.o. female with a known history of paroxysmal a fib on oral anticoagulation, Gerd, diabetes with microalbuminuria, hypertension, obesity comes to the emergency room after she felt dizzy lightheaded and mild chest pain initially after she walked 1.5 miles from [employer] where she went for lunch with her brother.  Clinical Impression  Patient received in bed eating lunch. Agrees to PT session. Performed bed mobility independently, transfers with supervision. Ambulated 150 feet with RW and supervision. She appears to be at baseline level of function. No further PT needs at this time.      Follow Up Recommendations No PT follow up    Equipment Recommendations  None recommended by PT    Recommendations for Other Services       Precautions / Restrictions Precautions Precautions: Fall Precaution Comments: mod fall Restrictions Weight Bearing Restrictions: No      Mobility  Bed Mobility Overal bed mobility: Modified Independent                Transfers Overall transfer level: Modified independent Equipment used: Rolling walker (2 wheeled)                Ambulation/Gait Ambulation/Gait assistance: Supervision Gait Distance (Feet): 150 Feet Assistive device: Rolling walker (2 wheeled) Gait Pattern/deviations: Step-through pattern Gait velocity: WNL   General Gait Details: safe with RW  Stairs            Wheelchair Mobility    Modified Rankin (Stroke Patients Only)       Balance Overall balance assessment: Modified Independent                                           Pertinent Vitals/Pain Pain Assessment: No/denies pain    Home Living Family/patient expects to be discharged to:: Private residence Living Arrangements: Other relatives Available Help at  Discharge: Family;Available PRN/intermittently         Home Layout: One level Home Equipment: Walker - 4 wheels;Cane - single point      Prior Function Level of Independence: Independent with assistive device(s)         Comments: Patient walked a mile and half with walker yesterday outside     Hand Dominance        Extremity/Trunk Assessment   Upper Extremity Assessment Upper Extremity Assessment: Overall WFL for tasks assessed    Lower Extremity Assessment Lower Extremity Assessment: Overall WFL for tasks assessed    Cervical / Trunk Assessment Cervical / Trunk Assessment: Normal  Communication   Communication: No difficulties  Cognition Arousal/Alertness: Awake/alert Behavior During Therapy: WFL for tasks assessed/performed Overall Cognitive Status: Within Functional Limits for tasks assessed                                        General Comments      Exercises     Assessment/Plan    PT Assessment Patent does not need any further PT services  PT Problem List Decreased strength;Decreased activity tolerance;Decreased mobility       PT Treatment Interventions Gait training    PT Goals (Current goals can be found in the  Care Plan section)  Acute Rehab PT Goals Patient Stated Goal: to return home PT Goal Formulation: With patient Time For Goal Achievement: 04/23/20 Potential to Achieve Goals: Good    Frequency     Barriers to discharge        Co-evaluation               AM-PAC PT "6 Clicks" Mobility  Outcome Measure Help needed turning from your back to your side while in a flat bed without using bedrails?: None Help needed moving from lying on your back to sitting on the side of a flat bed without using bedrails?: None Help needed moving to and from a bed to a chair (including a wheelchair)?: None Help needed standing up from a chair using your arms (e.g., wheelchair or bedside chair)?: None Help needed to walk in  hospital room?: None Help needed climbing 3-5 steps with a railing? : A Little 6 Click Score: 23    End of Session Equipment Utilized During Treatment: Gait belt Activity Tolerance: Patient tolerated treatment well Patient left: in chair;with call bell/phone within reach Nurse Communication: Mobility status PT Visit Diagnosis: Muscle weakness (generalized) (M62.81);Difficulty in walking, not elsewhere classified (R26.2)    Time: 5974-1638 PT Time Calculation (min) (ACUTE ONLY): 18 min   Charges:   PT Evaluation $PT Eval Low Complexity: 1 Low PT Treatments $Gait Training: 8-22 mins        Cleaster Shiffer, PT, GCS 04/23/20,2:03 PM

## 2020-04-26 ENCOUNTER — Other Ambulatory Visit: Payer: Self-pay

## 2020-04-26 ENCOUNTER — Emergency Department
Admission: EM | Admit: 2020-04-26 | Discharge: 2020-04-26 | Disposition: A | Discharge status: Home / Self Care | Payer: Medicare Other | Attending: Emergency Medicine | Admitting: Emergency Medicine

## 2020-04-26 DIAGNOSIS — C50919 Malignant neoplasm of unspecified site of unspecified female breast: Secondary | ICD-10-CM | POA: Diagnosis not present

## 2020-04-26 DIAGNOSIS — Z79899 Other long term (current) drug therapy: Secondary | ICD-10-CM | POA: Insufficient documentation

## 2020-04-26 DIAGNOSIS — C3490 Malignant neoplasm of unspecified part of unspecified bronchus or lung: Secondary | ICD-10-CM | POA: Insufficient documentation

## 2020-04-26 DIAGNOSIS — Z87891 Personal history of nicotine dependence: Secondary | ICD-10-CM | POA: Diagnosis not present

## 2020-04-26 DIAGNOSIS — R300 Dysuria: Secondary | ICD-10-CM | POA: Insufficient documentation

## 2020-04-26 DIAGNOSIS — E119 Type 2 diabetes mellitus without complications: Secondary | ICD-10-CM | POA: Insufficient documentation

## 2020-04-26 DIAGNOSIS — I10 Essential (primary) hypertension: Secondary | ICD-10-CM | POA: Diagnosis not present

## 2020-04-26 DIAGNOSIS — N3000 Acute cystitis without hematuria: Secondary | ICD-10-CM | POA: Diagnosis not present

## 2020-04-26 DIAGNOSIS — R42 Dizziness and giddiness: Secondary | ICD-10-CM

## 2020-04-26 LAB — CBC
HCT: 37.1 % (ref 36.0–46.0)
Hemoglobin: 12.5 g/dL (ref 12.0–15.0)
MCH: 30.4 pg (ref 26.0–34.0)
MCHC: 33.7 g/dL (ref 30.0–36.0)
MCV: 90.3 fL (ref 80.0–100.0)
Platelets: 205 10*3/uL (ref 150–400)
RBC: 4.11 MIL/uL (ref 3.87–5.11)
RDW: 15.4 % (ref 11.5–15.5)
WBC: 9.4 10*3/uL (ref 4.0–10.5)
nRBC: 0 % (ref 0.0–0.2)

## 2020-04-26 LAB — URINALYSIS, COMPLETE (UACMP) WITH MICROSCOPIC
Bilirubin Urine: NEGATIVE
Glucose, UA: NEGATIVE mg/dL
Hgb urine dipstick: NEGATIVE
Ketones, ur: NEGATIVE mg/dL
Nitrite: NEGATIVE
Protein, ur: NEGATIVE mg/dL
Specific Gravity, Urine: 1.015 (ref 1.005–1.030)
WBC, UA: >50 WBC/hpf — ABNORMAL HIGH (ref 0–5)
pH: 7 (ref 5.0–8.0)

## 2020-04-26 LAB — BASIC METABOLIC PANEL
Anion gap: 10 (ref 5–15)
BUN: 21 mg/dL (ref 8–23)
CO2: 25 mmol/L (ref 22–32)
Calcium: 9.3 mg/dL (ref 8.9–10.3)
Chloride: 108 mmol/L (ref 98–111)
Creatinine, Ser: 1.28 mg/dL — ABNORMAL HIGH (ref 0.44–1.00)
GFR calc Af Amer: 47 mL/min — ABNORMAL LOW (ref >60)
GFR calc non Af Amer: 41 mL/min — ABNORMAL LOW (ref >60)
Glucose, Bld: 80 mg/dL (ref 70–99)
Potassium: 4 mmol/L (ref 3.5–5.1)
Sodium: 143 mmol/L (ref 135–145)

## 2020-04-26 LAB — TROPONIN I (HIGH SENSITIVITY)
Troponin I (High Sensitivity): 7 ng/L (ref <18)
Troponin I (High Sensitivity): 4 ng/L (ref <18)

## 2020-04-26 LAB — GLUCOSE, CAPILLARY: Glucose-Capillary: 73 mg/dL (ref 70–99)

## 2020-04-26 MED ORDER — LACTATED RINGERS IV BOLUS
1000.0000 mL | Freq: Once | INTRAVENOUS | Status: AC
  Administered 2020-04-26: 1000 mL via INTRAVENOUS

## 2020-04-26 MED ORDER — SODIUM CHLORIDE 0.9 % IV SOLN
1.0000 g | Freq: Once | INTRAVENOUS | Status: AC
  Administered 2020-04-26: 1 g via INTRAVENOUS
  Filled 2020-04-26: qty 10

## 2020-04-26 MED ORDER — CEPHALEXIN 500 MG PO CAPS
500.0000 mg | ORAL_CAPSULE | Freq: Two times a day (BID) | ORAL | 0 refills | Status: AC
Start: 2020-04-26 — End: 2020-05-03

## 2020-04-26 NOTE — ED Triage Notes (Signed)
Pt states has felt weak since Saturday with increased urination. Pt states history of DM. Pt appears in no acute sitress, pt recently admitted for same per pt.

## 2020-04-26 NOTE — ED Notes (Addendum)
Pt st DC on Sunday admitted for syncopal episode.  Pt st today sitting down and felt "dizzy" "I was sitting and felt like I was going to pass out again". Denies falling/hitting her head. Pt A/Ox4 at this time.  St polyuria after DC from hospital on Sunday.

## 2020-04-26 NOTE — ED Notes (Signed)
This RN attempted to establish PIVx1 unsuccessfully.

## 2020-04-26 NOTE — ED Triage Notes (Signed)
Pt now also complains of upper abd pain intermittently.

## 2020-04-26 NOTE — ED Notes (Signed)
Pt ambulatory to toilet with EDT at bedside.

## 2020-04-26 NOTE — ED Notes (Signed)
Pt ambulatory independently. Pt st she uses a cane PRN this rn at bedside.

## 2020-04-26 NOTE — ED Notes (Signed)
IV tam at beside.

## 2020-04-26 NOTE — ED Notes (Signed)
RN Karena Addison attempted to establish PIVx2 w/o success.

## 2020-04-26 NOTE — ED Notes (Signed)
Pt provided with fluids at this time. Per pt request.

## 2020-04-26 NOTE — ED Provider Notes (Signed)
[facility] Emergency Department Provider Note   ____________________________________________   First MD Initiated Contact with Patient 04/26/20 1515     (approximate)  I have reviewed the triage vital signs and the nursing notes.   HISTORY  Chief Complaint Weakness    HPI Tracy Dickerson is a 79 y.o. female with past medical history of hypertension, diabetes, and atrial fibrillation on Eliquis who presents to the ED complaining of lightheadedness and dysuria.  Patient was recently admitted for syncope and dehydration with associated AKI.  She states she has continued to have "dizzy spells" since then where she will get lightheaded at times, but she has not passed out at all since her recent discharge.  She denies any associated chest pain or shortness of breath, but does state that she has been urinating more frequently with some dysuria.  She denies any fevers, nausea, vomiting, abdominal pain, or flank pain.  Work-up during recent mission was unremarkable for cardiac etiology of her symptoms.        Past Medical History:  Diagnosis Date  . 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 Diabetes mellitus without complication ([place])   . GERD (gastroesophageal reflux disease)   . Heart disease   . Hypertension     Patient Active Problem List   Diagnosis Date Noted  . Dehydration, moderate   . Acute renal failure ([place]) 04/22/2020  . Hyperkalemia   . Septic shock ([place])   . Sepsis due to urinary tract infection ([place]) 03/13/2018  . Acute kidney injury superimposed on chronic kidney disease ([place]) 02/13/2018  . Closed extra-articular fracture of distal tibia 02/02/2018  . Pneumonia 10/29/2017  . Hypotension 01/26/2017    Past Surgical History:  Procedure Laterality Date  . ABDOMINAL HYSTERECTOMY    . APPENDECTOMY    . BREAST SURGERY     breast cancer LEFT  . OPEN REDUCTION INTERNAL FIXATION (ORIF) TIBIA/FIBULA FRACTURE Left 02/03/2018   Procedure: OPEN  REDUCTION INTERNAL FIXATION (ORIF) DISTAL TIBIA FRACTURE;  Surgeon: Corky Mull, MD;  Location: [facility] ORS;  Service: Orthopedics;  Laterality: Left;  ankle/lower leg  . TONSILLECTOMY      Prior to Admission medications   Medication Sig Start Date End Date Taking? Authorizing Provider  albuterol (PROAIR HFA) 108 (90 Base) MCG/ACT inhaler Inhale 2 puffs into the lungs every 4 (four) hours as needed for wheezing or shortness of breath.     [provider]  apixaban (ELIQUIS) 5 MG TABS tablet Take 5 mg by mouth 2 (two) times daily. 06/17/18   [provider]  cephALEXin (KEFLEX) 500 MG capsule Take 1 capsule (500 mg total) by mouth 2 (two) times daily for 7 days. 04/26/20 05/03/20  Blake Divine, MD  diphenhydramine-acetaminophen (TYLENOL PM) 25-500 MG TABS tablet Take 1-2 tablets by mouth at bedtime as needed (pain or sleep).     [provider]  gabapentin (NEURONTIN) 300 MG capsule Take 600 mg by mouth at bedtime.    [provider]  lisinopril-hydrochlorothiazide (ZESTORETIC) 10-12.5 MG tablet Take 1 tablet by mouth daily. 03/10/20   [provider]  metFORMIN (GLUCOPHAGE) 1000 MG tablet Take 1,000 mg by mouth 2 (two) times daily with a meal.    [provider]  pramipexole (MIRAPEX) 0.25 MG tablet Take 0.25 mg by mouth at bedtime. 12/23/16   [provider]  SPIRIVA HANDIHALER 18 MCG inhalation capsule Place 1 capsule (18 mcg total) into inhaler and inhale daily. 04/23/20  [name], MD    Allergies Oxycodone, Percocet [oxycodone-acetaminophen], and Penicillins  Family History  Problem Relation Age of Onset  . Cancer Mother     Social History Social History   Tobacco Use  . Smoking status: Former Research scientist (life sciences)  . Smokeless tobacco: Never Used  Vaping Use  . Vaping Use: Never used  Substance Use Topics  . Alcohol use: Not Currently  . Drug use: No    Review of Systems  Constitutional: No fever/chills.  Positive for  lightheadedness. Eyes: No visual changes. ENT: No sore throat. Cardiovascular: Denies chest pain. Respiratory: Denies shortness of breath. Gastrointestinal: No abdominal pain.  No nausea, no vomiting.  No diarrhea.  No constipation. Genitourinary: Positive for dysuria and urinary frequency. Musculoskeletal: Negative for back pain. Skin: Negative for rash. Neurological: Negative for headaches, focal weakness or numbness.  ____________________________________________   PHYSICAL EXAM:  VITAL SIGNS: ED Triage Vitals  Enc Vitals Group     BP 04/26/20 1233 (!) 106/58     Pulse Rate 04/26/20 1231 82     Resp 04/26/20 1231 14     Temp 04/26/20 1231 98 F (36.7 C)     Temp Source 04/26/20 1231 Oral     SpO2 04/26/20 1231 98 %     Weight 04/26/20 1232 236 lb (107 kg)     Height 04/26/20 1232 5\' 6"  (1.676 m)     Head Circumference --      Peak Flow --      Pain Score 04/26/20 1232 0     Pain Loc --      Pain Edu? --      Excl. in [place]? --     Constitutional: Alert and oriented. Eyes: Conjunctivae are normal. Head: Atraumatic. Nose: No congestion/rhinnorhea. Mouth/Throat: Mucous membranes are moist. Neck: Normal ROM Cardiovascular: Normal rate, regular rhythm. Grossly normal heart sounds. Respiratory: Normal respiratory effort.  No retractions. Lungs CTAB. Gastrointestinal: Soft and nontender. No distention.  No CVA tenderness bilaterally. Genitourinary: deferred Musculoskeletal: No lower extremity tenderness nor edema. Neurologic:  Normal speech and language. No gross focal neurologic deficits are appreciated. Skin:  Skin is warm, dry and intact. No rash noted. Psychiatric: Mood and affect are normal. Speech and behavior are normal.  ____________________________________________   LABS (all labs ordered are listed, but only abnormal results are displayed)  Labs Reviewed  URINALYSIS, COMPLETE (UACMP) WITH MICROSCOPIC - Abnormal; Notable for the following components:       Result Value   Color, Urine YELLOW (*)    APPearance CLOUDY (*)    Leukocytes,Ua SMALL (*)    WBC, UA >50 (*)    Bacteria, UA FEW (*)    All other components within normal limits  BASIC METABOLIC PANEL - Abnormal; Notable for the following components:   Creatinine, Ser 1.28 (*)    GFR calc non Af Amer 41 (*)    GFR calc Af Amer 47 (*)    All other components within normal limits  URINE CULTURE  CBC  GLUCOSE, CAPILLARY  TROPONIN I (HIGH SENSITIVITY)  TROPONIN I (HIGH SENSITIVITY)   ____________________________________________  EKG  ED ECG REPORT I, Blake Divine, the attending physician, personally viewed and interpreted this ECG.   Date: 04/26/2020  EKG Time: 12:34  Rate: 91  Rhythm: normal sinus rhythm  Axis: Normal  Intervals:none  ST&T Change: None   PROCEDURES  Procedure(s) performed (including Critical Care):  Procedures   ____________________________________________   INITIAL IMPRESSION / ASSESSMENT AND PLAN / ED COURSE  79 year old female with past medical history of hypertension, diabetes, and atrial fibrillation on Eliquis who presents to the ED with ongoing intermittent episodes of lightheadedness that are now associated with dysuria and urinary frequency.  Patient is currently well-appearing with reassuring vital signs, EKG shows no evidence of arrhythmia or ischemia.  Blood work shows renal function stable compared to prior, but UA does show UTI.  She has no fever, abdominal pain, or CVA tenderness to suggest pyelonephritis.  We will hydrate with IV fluids and treat with dose of Rocephin.  Recent cardiac work-up for her near syncopal episodes was unremarkable and at this point I suspect her dizzy episodes could be related to UTI.  We will reassess following fluids and antibiotics, but I anticipate she will be appropriate for discharge home with PCP follow-up.  UA is positive for UTI, patient reports feeling better following IV fluid bolus, she was  also given a dose of Rocephin.  She has an allergy listed to penicillins but has previously tolerated cephalosporins, we will treat UTI with Keflex.  No cardiac events noted on monitor and she is appropriate for discharge home with PCP follow-up.  She was counseled to return to the ED for new worsening symptoms, patient agrees with plan.      ____________________________________________   FINAL CLINICAL IMPRESSION(S) / ED DIAGNOSES  Final diagnoses:  Lightheadedness  Acute cystitis without hematuria     ED Discharge Orders         Ordered    cephALEXin (KEFLEX) 500 MG capsule  2 times daily     Discontinue  Reprint     04/26/20 1931           Note:  This document was prepared using Dragon voice recognition software and may include unintentional dictation errors.   Blake Divine, MD 04/26/20 [phone]

## 2020-04-28 LAB — URINE CULTURE: Culture: >=100000 — AB

## 2020-05-06 ENCOUNTER — Other Ambulatory Visit: Payer: Self-pay

## 2020-05-06 ENCOUNTER — Encounter: Payer: Self-pay | Admitting: Emergency Medicine

## 2020-05-06 ENCOUNTER — Emergency Department
Admission: EM | Admit: 2020-05-06 | Discharge: 2020-05-06 | Disposition: A | Discharge status: Home / Self Care | Payer: Medicare Other | Attending: Emergency Medicine | Admitting: Emergency Medicine

## 2020-05-06 DIAGNOSIS — Z853 Personal history of malignant neoplasm of breast: Secondary | ICD-10-CM | POA: Diagnosis not present

## 2020-05-06 DIAGNOSIS — E119 Type 2 diabetes mellitus without complications: Secondary | ICD-10-CM | POA: Diagnosis not present

## 2020-05-06 DIAGNOSIS — Z7901 Long term (current) use of anticoagulants: Secondary | ICD-10-CM | POA: Insufficient documentation

## 2020-05-06 DIAGNOSIS — E860 Dehydration: Secondary | ICD-10-CM | POA: Insufficient documentation

## 2020-05-06 DIAGNOSIS — Z79899 Other long term (current) drug therapy: Secondary | ICD-10-CM | POA: Insufficient documentation

## 2020-05-06 DIAGNOSIS — Z87891 Personal history of nicotine dependence: Secondary | ICD-10-CM | POA: Diagnosis not present

## 2020-05-06 DIAGNOSIS — I10 Essential (primary) hypertension: Secondary | ICD-10-CM | POA: Diagnosis not present

## 2020-05-06 DIAGNOSIS — R531 Weakness: Secondary | ICD-10-CM | POA: Diagnosis not present

## 2020-05-06 DIAGNOSIS — Z85118 Personal history of other malignant neoplasm of bronchus and lung: Secondary | ICD-10-CM | POA: Diagnosis not present

## 2020-05-06 LAB — BASIC METABOLIC PANEL
Anion gap: 11 (ref 5–15)
BUN: 40 mg/dL — ABNORMAL HIGH (ref 8–23)
CO2: 24 mmol/L (ref 22–32)
Calcium: 9.2 mg/dL (ref 8.9–10.3)
Chloride: 107 mmol/L (ref 98–111)
Creatinine, Ser: 1.81 mg/dL — ABNORMAL HIGH (ref 0.44–1.00)
GFR calc Af Amer: 31 mL/min — ABNORMAL LOW (ref >60)
GFR calc non Af Amer: 27 mL/min — ABNORMAL LOW (ref >60)
Glucose, Bld: 102 mg/dL — ABNORMAL HIGH (ref 70–99)
Potassium: 3.6 mmol/L (ref 3.5–5.1)
Sodium: 142 mmol/L (ref 135–145)

## 2020-05-06 LAB — CBC
HCT: 37.5 % (ref 36.0–46.0)
Hemoglobin: 12.1 g/dL (ref 12.0–15.0)
MCH: 31.5 pg (ref 26.0–34.0)
MCHC: 32.3 g/dL (ref 30.0–36.0)
MCV: 97.7 fL (ref 80.0–100.0)
Platelets: 210 10*3/uL (ref 150–400)
RBC: 3.84 MIL/uL — ABNORMAL LOW (ref 3.87–5.11)
RDW: 15.9 % — ABNORMAL HIGH (ref 11.5–15.5)
WBC: 6.8 10*3/uL (ref 4.0–10.5)
nRBC: 0 % (ref 0.0–0.2)

## 2020-05-06 LAB — TROPONIN I (HIGH SENSITIVITY): Troponin I (High Sensitivity): 4 ng/L (ref <18)

## 2020-05-06 IMAGING — DX DG CHEST 1V
1 series · 1 of 1 positions shown · non-contrast
Comparison: 01/31/2018

CLINICAL DATA: Recent fall

EXAM:
CHEST  1 VIEW

[chest ap]
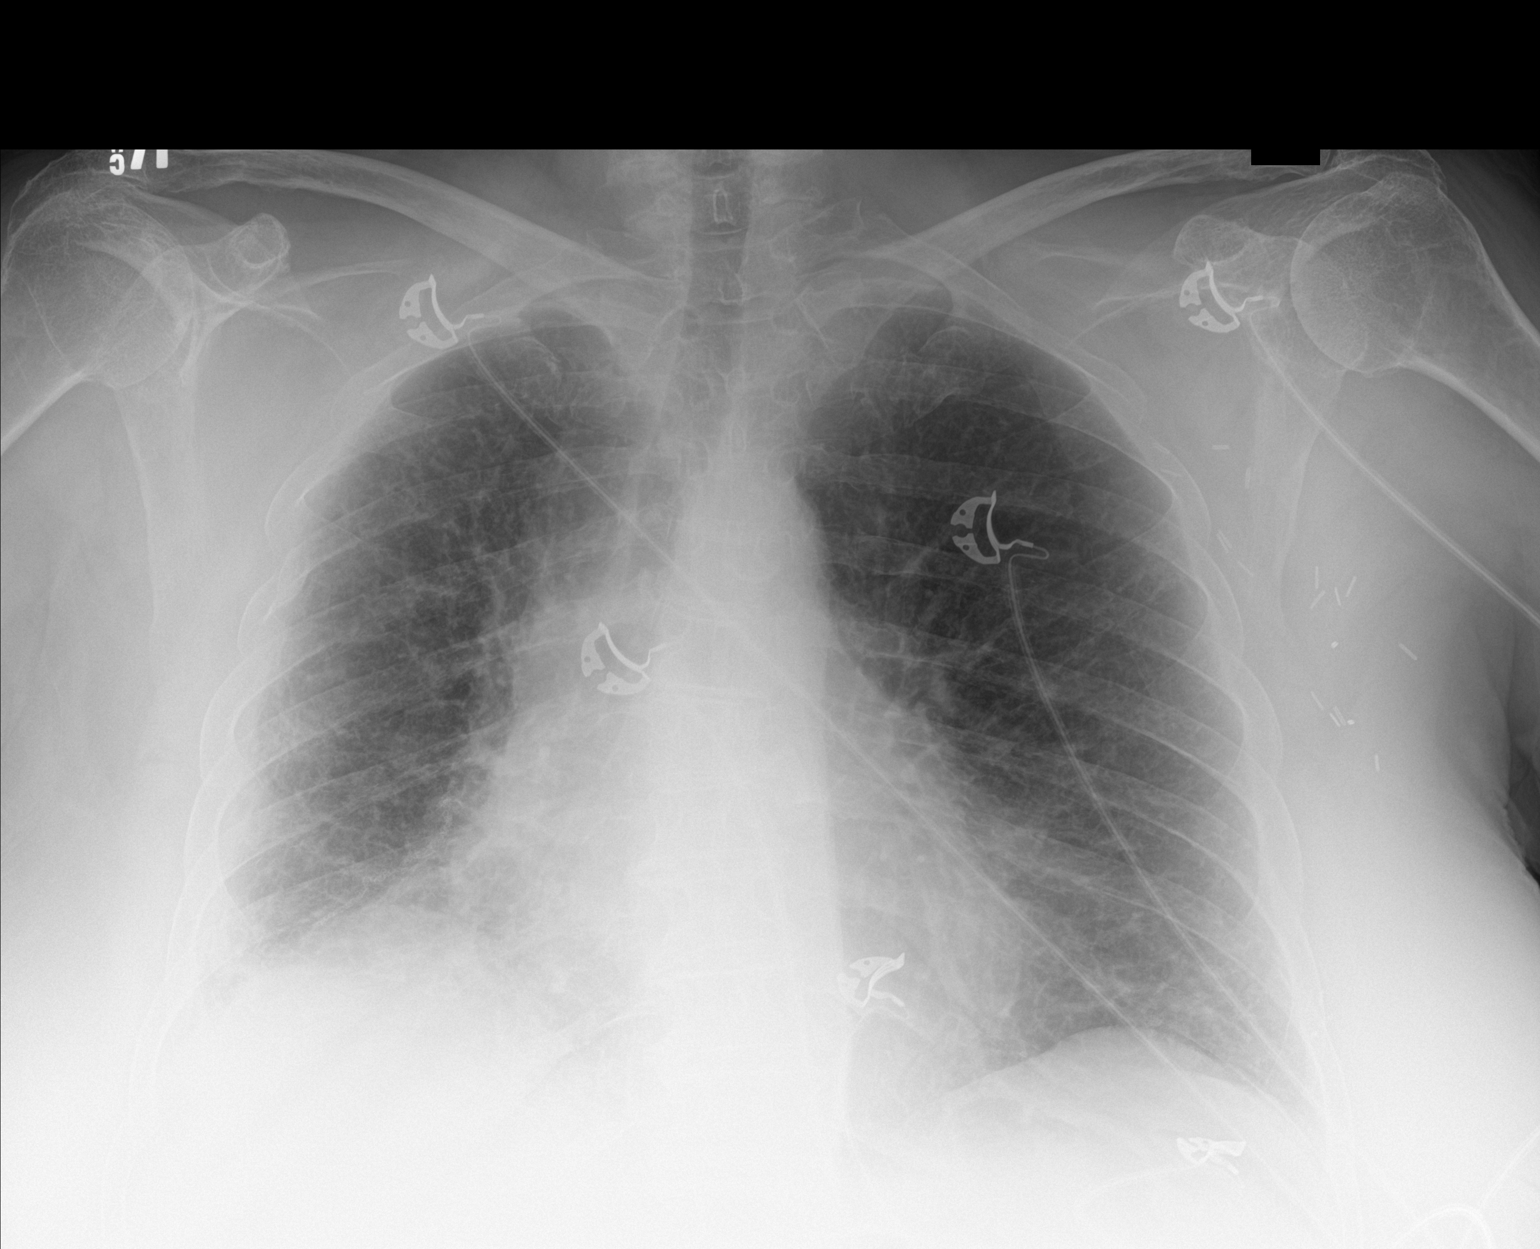

[1 of 1 positions shown; findings below may reference images not displayed]

FINDINGS: Cardiac shadow is within normal limits. Lungs are well aerated with
some mild volume loss on the right secondary to prior surgery. No
focal infiltrate or sizable effusion is noted. No bony abnormality
is seen.
IMPRESSION: Postsurgical changes with volume loss on the right. No acute
abnormality noted.

## 2020-05-06 IMAGING — DX DG ANKLE COMPLETE 3+V*L*
3 series · 3 of 3 positions shown · non-contrast
Comparison: None.

CLINICAL DATA: Recent fall with left ankle pain, initial encounter

EXAM:
LEFT ANKLE COMPLETE - 3+ VIEW

[ankle ap]
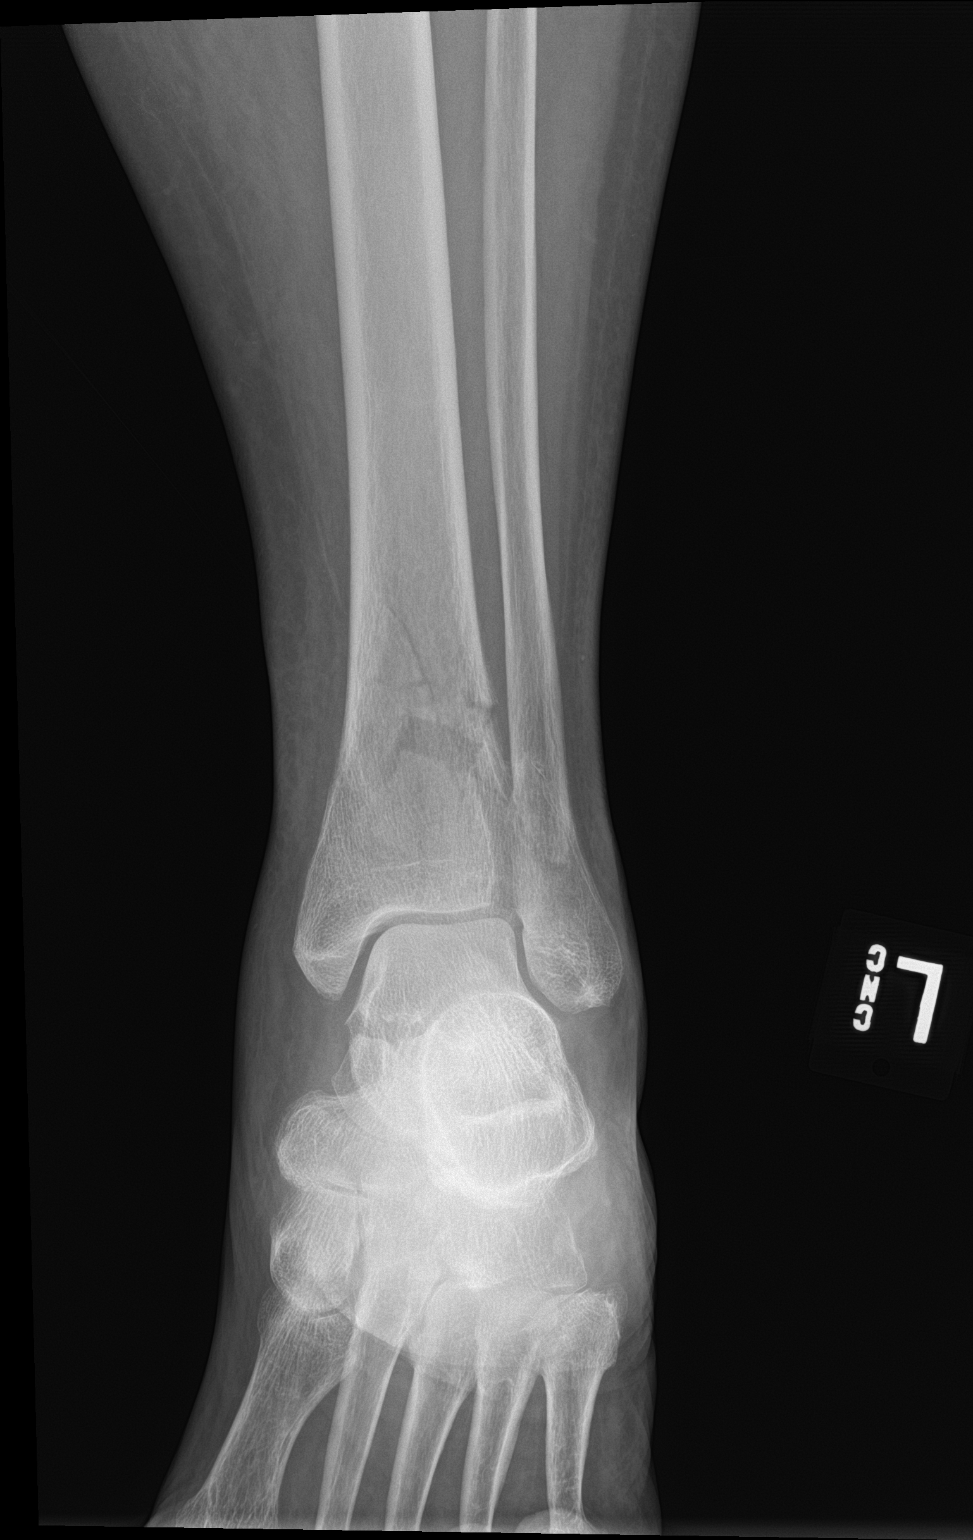

[ankle obl]
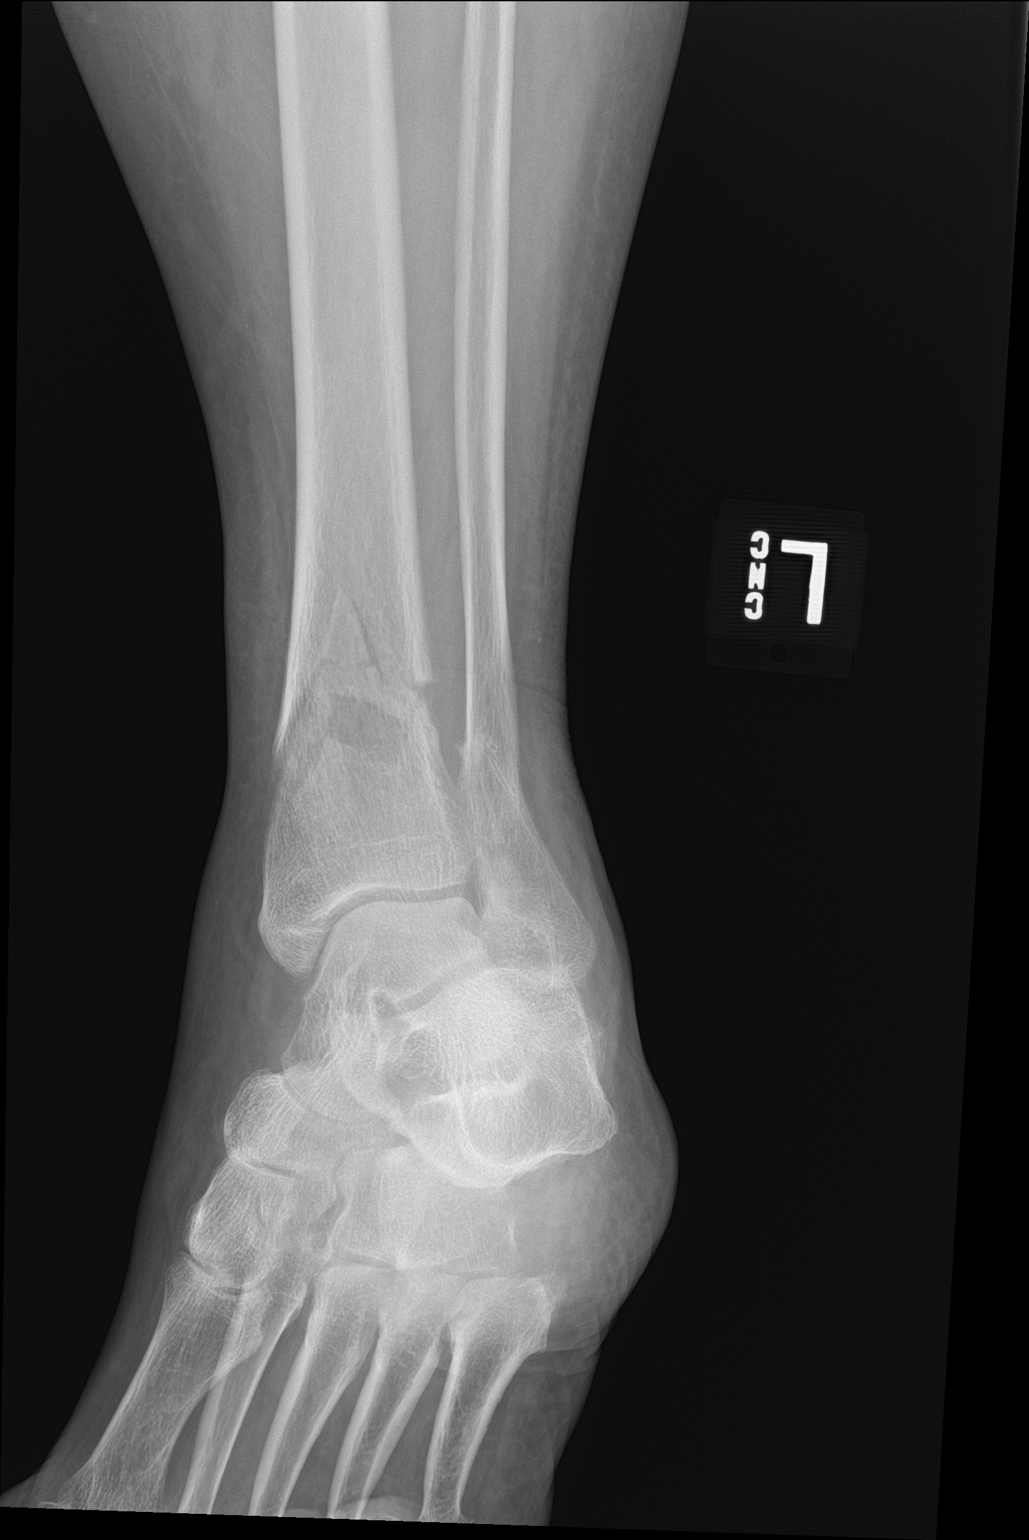

[ankle lat]
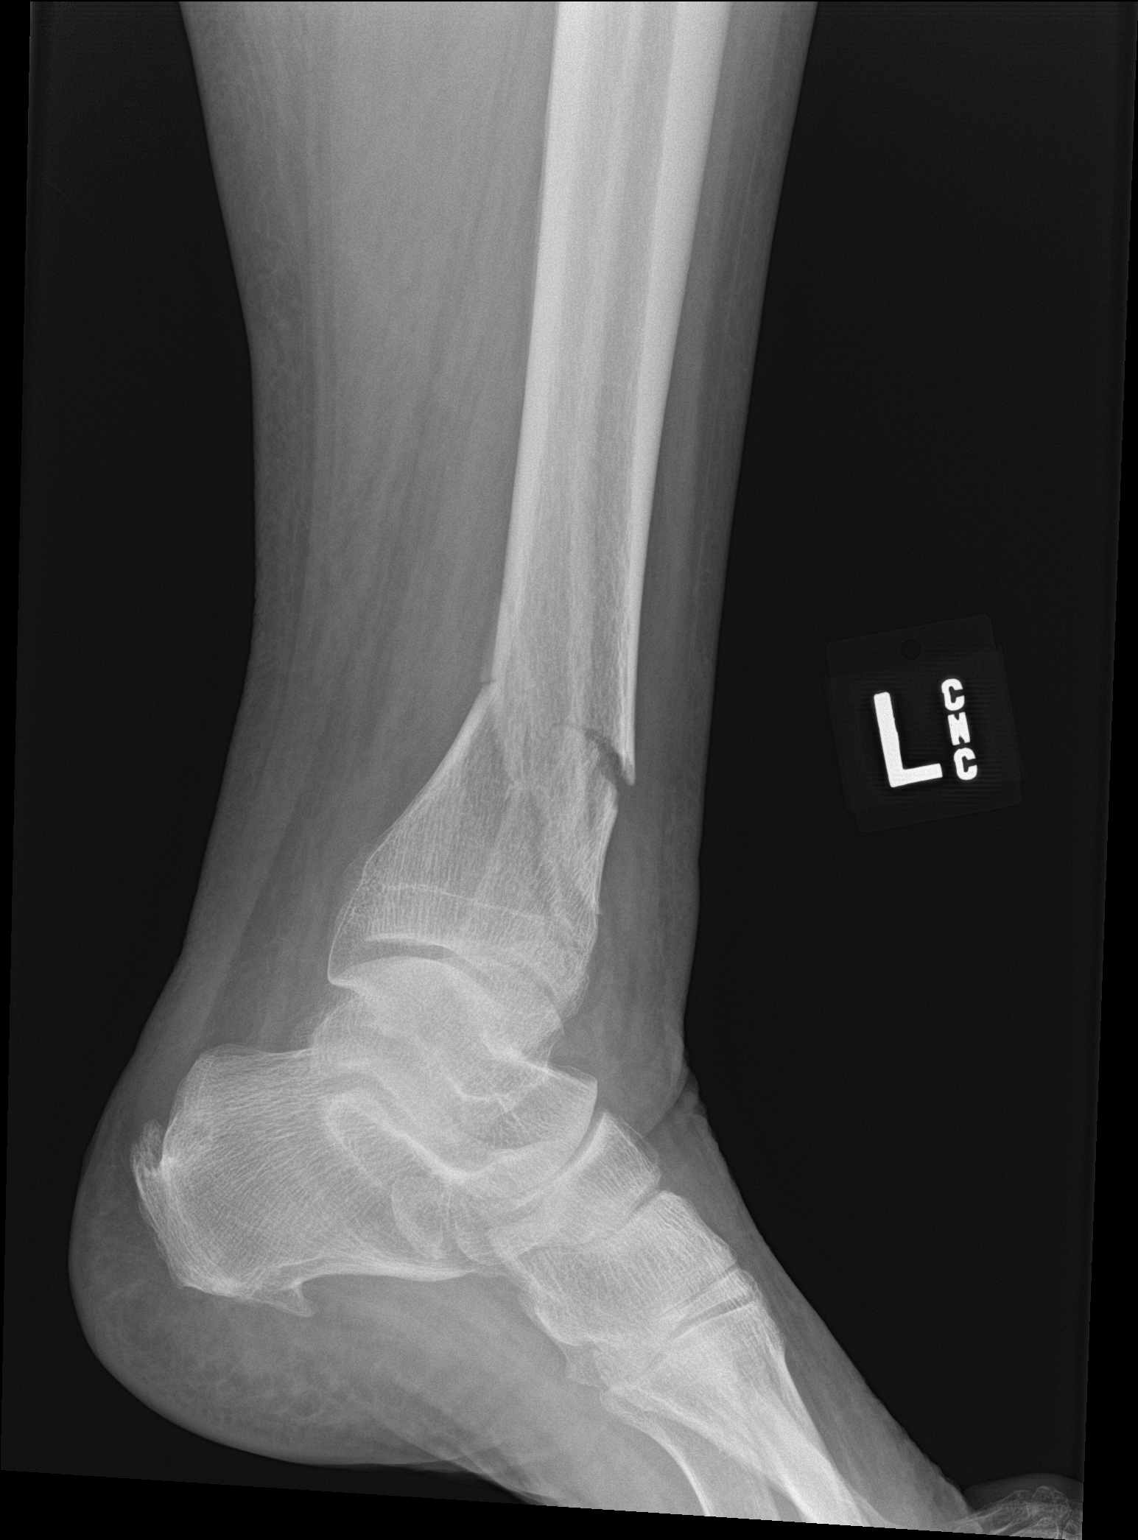

[3 of 3 positions shown; findings below may reference images not displayed]

FINDINGS: Comminuted fracture of the distal tibial metaphysis is seen.
Additionally oblique fracture through the distal fibula is noted.
Mild posterior angulation of the distal tibial fracture fragments is
noted. Calcaneal spurring is seen.
IMPRESSION: Distal tibial and fibular fractures as described.

## 2020-05-06 IMAGING — DX DG HAND 2V*R*
2 series · 2 of 2 positions shown · non-contrast
Comparison: 10/31/2016

CLINICAL DATA: Recent fall with right hand pain, initial encounter

EXAM:
RIGHT HAND - 2 VIEW

[hand ap]
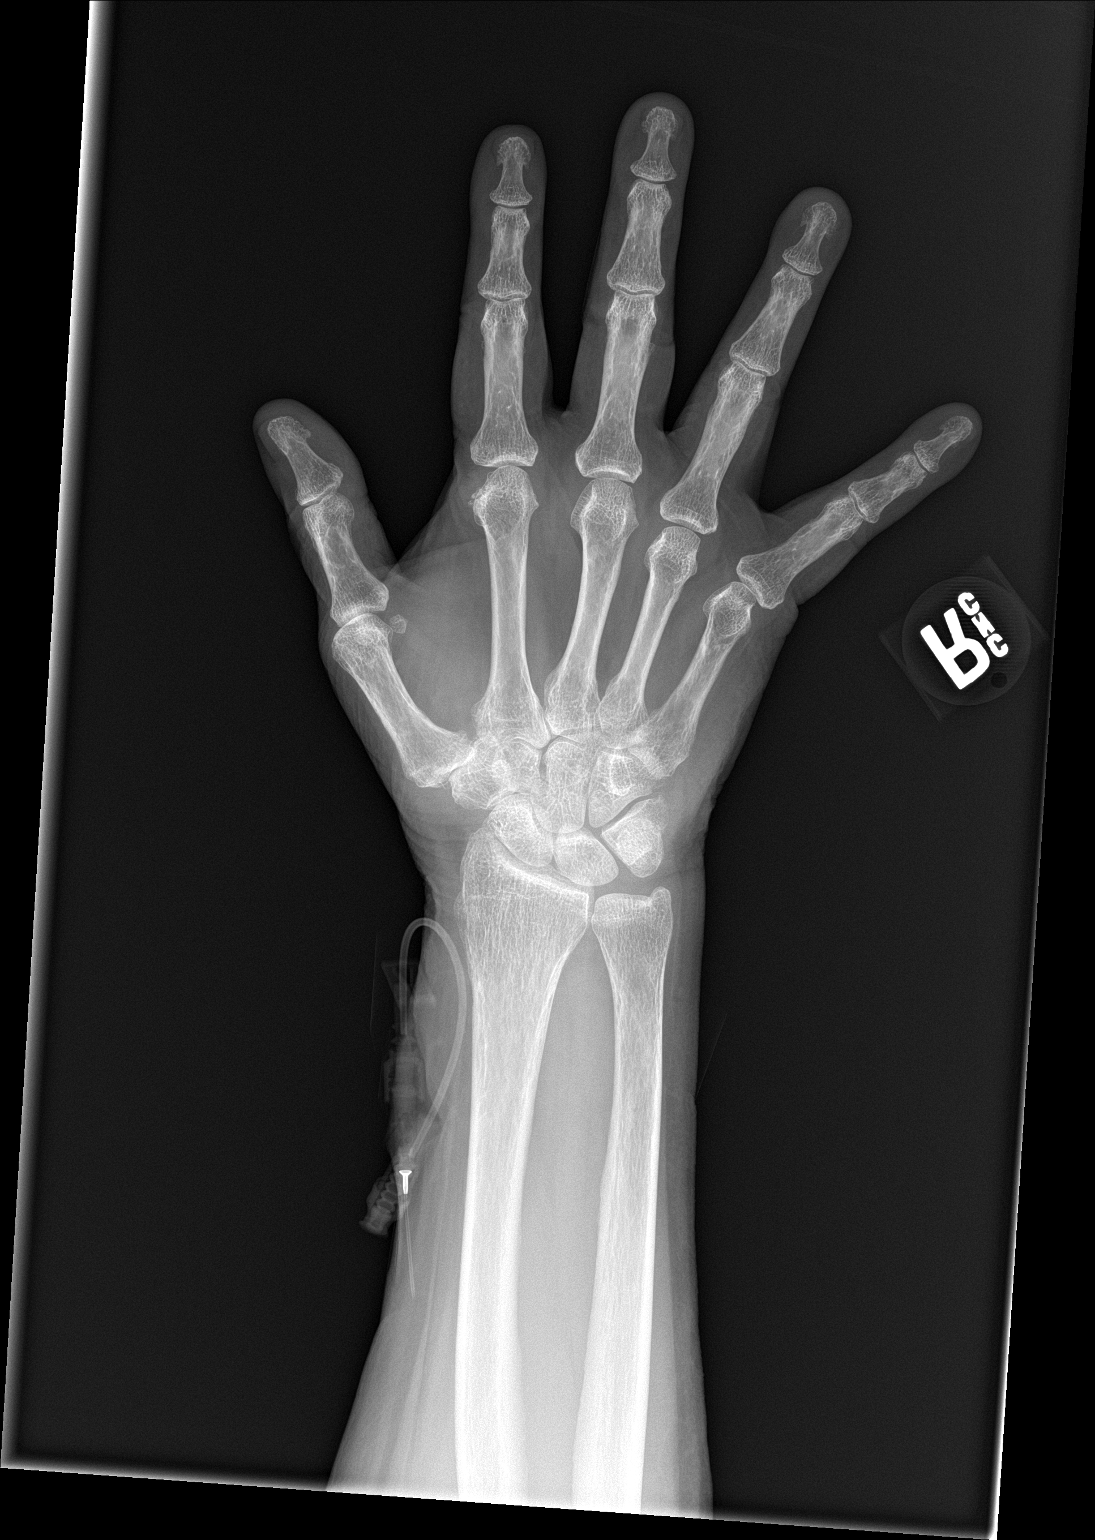

[hand lat]
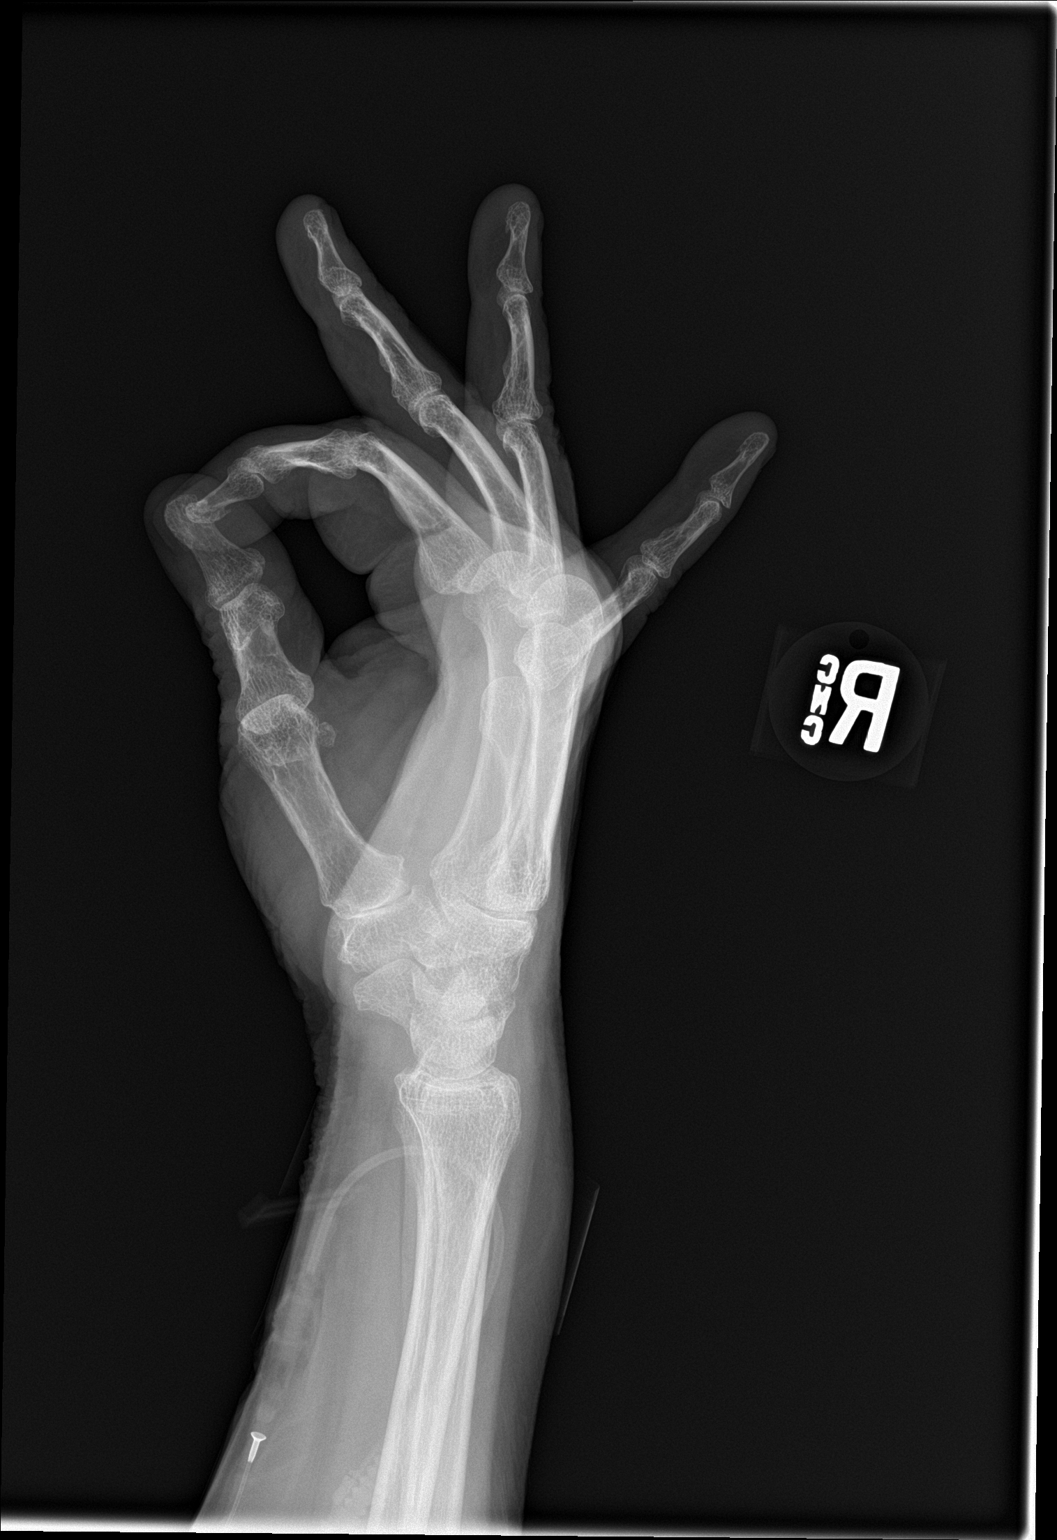

[2 of 2 positions shown; findings below may reference images not displayed]

FINDINGS: No acute fracture or dislocation is noted. Mild degenerative changes
in the first CMC joint are noted. No soft tissue abnormality is
seen.
IMPRESSION: No acute abnormality noted.

## 2020-05-06 MED ORDER — SODIUM CHLORIDE 0.9 % IV BOLUS
1000.0000 mL | Freq: Once | INTRAVENOUS | Status: AC
  Administered 2020-05-06: 1000 mL via INTRAVENOUS

## 2020-05-06 NOTE — ED Provider Notes (Signed)
[facility] Emergency Department Provider Note   ____________________________________________   I have reviewed the triage vital signs and the nursing notes.   HISTORY  Chief Complaint Fatigue and Weakness   History limited by: Not Limited   HPI Tracy Dickerson is a 79 y.o. female who presents to the emergency department today because of concerns for possible sun stroke yesterday.  She states she is here because her brother thought she should be evaluated.  She states that she was outside yesterday in the intense heat.  To try to keep up with her hydration with water and diet sodas.  She did feel lightheaded yesterday and feels weak in the legs today.  She does complain of some chest pain and headache that started yesterday and is continued today.    Records reviewed. Per medical record review patient has a history of acute renal failure.  Past Medical History:  Diagnosis Date  . 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 Diabetes mellitus without complication ([place])   . GERD (gastroesophageal reflux disease)   . Heart disease   . Hypertension     Patient Active Problem List   Diagnosis Date Noted  . Dehydration, moderate   . Acute renal failure ([place]) 04/22/2020  . Hyperkalemia   . Septic shock ([place])   . Sepsis due to urinary tract infection ([place]) 03/13/2018  . Acute kidney injury superimposed on chronic kidney disease ([place]) 02/13/2018  . Closed extra-articular fracture of distal tibia 02/02/2018  . Pneumonia 10/29/2017  . Hypotension 01/26/2017    Past Surgical History:  Procedure Laterality Date  . ABDOMINAL HYSTERECTOMY    . APPENDECTOMY    . BREAST SURGERY     breast cancer LEFT  . OPEN REDUCTION INTERNAL FIXATION (ORIF) TIBIA/FIBULA FRACTURE Left 02/03/2018   Procedure: OPEN REDUCTION INTERNAL FIXATION (ORIF) DISTAL TIBIA FRACTURE;  Surgeon: Corky Mull, MD;  Location: [facility] ORS;  Service: Orthopedics;  Laterality: Left;  ankle/lower leg  .  TONSILLECTOMY      Prior to Admission medications   Medication Sig Start Date End Date Taking? Authorizing Provider  albuterol (PROAIR HFA) 108 (90 Base) MCG/ACT inhaler Inhale 2 puffs into the lungs every 4 (four) hours as needed for wheezing or shortness of breath.     [provider]  apixaban (ELIQUIS) 5 MG TABS tablet Take 5 mg by mouth 2 (two) times daily. 06/17/18   [provider]  diphenhydramine-acetaminophen (TYLENOL PM) 25-500 MG TABS tablet Take 1-2 tablets by mouth at bedtime as needed (pain or sleep).     [provider]  gabapentin (NEURONTIN) 300 MG capsule Take 600 mg by mouth at bedtime.    [provider]  lisinopril-hydrochlorothiazide (ZESTORETIC) 10-12.5 MG tablet Take 1 tablet by mouth daily. 03/10/20   [provider]  metFORMIN (GLUCOPHAGE) 1000 MG tablet Take 1,000 mg by mouth 2 (two) times daily with a meal.    [provider]  pramipexole (MIRAPEX) 0.25 MG tablet Take 0.25 mg by mouth at bedtime. 12/23/16   [provider]  SPIRIVA HANDIHALER 18 MCG inhalation capsule Place 1 capsule (18 mcg total) into inhaler and inhale daily. 04/23/20   [name], MD    Allergies Oxycodone, Percocet [oxycodone-acetaminophen], and Penicillins  Family History  Problem Relation Age of Onset  . Cancer Mother     Social History Social History   Tobacco Use  . Smoking status: Former Research scientist (life sciences)  . Smokeless tobacco: Never Used  Vaping Use  .  Vaping Use: Never used  Substance Use Topics  . Alcohol use: Not Currently  . Drug use: No    Review of Systems Constitutional: Positive for subjective fever. Eyes: No visual changes. ENT: No sore throat. Cardiovascular: Positive for chest pain. Respiratory: Denies shortness of breath. Gastrointestinal: No abdominal pain.  No nausea, no vomiting.  No diarrhea.   Genitourinary: Negative for dysuria. Musculoskeletal: Negative for back pain. Skin: Negative for  rash. Neurological: Positive for headache.  ____________________________________________   PHYSICAL EXAM:  VITAL SIGNS: ED Triage Vitals  Enc Vitals Group     BP 05/06/20 0834 (!) 90/49     Pulse Rate 05/06/20 0834 70     Resp 05/06/20 0834 20     Temp 05/06/20 0834 97.8 F (36.6 C)     Temp Source 05/06/20 0834 Oral     SpO2 05/06/20 0834 96 %     Weight 05/06/20 0835 236 lb (107 kg)     Height 05/06/20 0835 5\' 6"  (1.676 m)     Head Circumference --      Peak Flow --      Pain Score 05/06/20 0835 10   Constitutional: Alert and oriented.  Eyes: Conjunctivae are normal.  ENT      Head: Normocephalic and atraumatic.      Nose: No congestion/rhinnorhea.      Mouth/Throat: Mucous membranes are moist.      Neck: No stridor. Hematological/Lymphatic/Immunilogical: No cervical lymphadenopathy. Cardiovascular: Normal rate, regular rhythm.  No murmurs, rubs, or gallops.  Respiratory: Normal respiratory effort without tachypnea nor retractions. Breath sounds are clear and equal bilaterally. No wheezes/rales/rhonchi. Gastrointestinal: Soft and non tender. No rebound. No guarding.  Genitourinary: Deferred Musculoskeletal: Normal range of motion in all extremities. No lower extremity edema. Neurologic:  Normal speech and language. No gross focal neurologic deficits are appreciated.  Skin:  Skin is warm, dry and intact. No rash noted. Psychiatric: Mood and affect are normal. Speech and behavior are normal. Patient exhibits appropriate insight and judgment.  ____________________________________________    LABS (pertinent positives/negatives)  CBC wbc 6.8, hgb 12.1, plt 210 BMP na 142, k 3.6, glu 102, cr 1.81  ____________________________________________   EKG  I, Nance Pear, attending physician, personally viewed and interpreted this EKG  EKG Time: 0840 Rate: 81 Rhythm: normal sinus rhythm Axis: normal Intervals: qtc 443 QRS: low voltage ST changes: no st  elevation Impression: abnormal ekg  ____________________________________________    RADIOLOGY  None  ____________________________________________   PROCEDURES  Procedures  ____________________________________________   INITIAL IMPRESSION / ASSESSMENT AND PLAN / ED COURSE  Pertinent labs & imaging results that were available during my care of the patient were reviewed by me and considered in my medical decision making (see chart for details).   Patient presented to the emergency department today because of concerns for weakness and possible heatstroke.  Patient's blood work here is concerning for slight dehydration.  Creatinine is elevated over what it has been recently.  Patient was given IV fluids and did feel better afterwards.  Discussed with patient importance of following up with primary care for blood work recheck.  Discussed some purchase of hydration.  Did recommend water over sodas.  ____________________________________________   FINAL CLINICAL IMPRESSION(S) / ED DIAGNOSES  Final diagnoses:  Weakness  Dehydration     Note: This dictation was prepared with Dragon dictation. Any transcriptional errors that result from this process are unintentional     Nance Pear, MD 05/06/20 [id number]

## 2020-05-06 NOTE — ED Notes (Signed)
Repeat VS obtained by this RN. Pt visualized in NAD at this time. Pt provided with another warm blanket by the screener.

## 2020-05-06 NOTE — ED Triage Notes (Signed)
Pt presents to ED via [facility] with c/o intermittently fever after drinking her cup of coffee, states being treated for kidney infection. Pt states increasing fatigue with walking after walking on the bus at this time. Pt A&O x4 upon arrival to ED.

## 2020-05-06 NOTE — ED Notes (Signed)
pt given meal tray and sprite

## 2020-05-06 NOTE — Discharge Instructions (Addendum)
As we discussed it is very important that you follow up with your doctor to have your blood work rechecked. There was evidence of some dehydration today. Please seek medical attention for any high fevers, chest pain, shortness of breath, change in behavior, persistent vomiting, bloody stool or any other new or concerning symptoms.

## 2020-05-27 ENCOUNTER — Encounter: Payer: Self-pay | Admitting: Emergency Medicine

## 2020-05-27 ENCOUNTER — Other Ambulatory Visit: Payer: Self-pay

## 2020-05-27 ENCOUNTER — Emergency Department
Admission: EM | Admit: 2020-05-27 | Discharge: 2020-05-27 | Disposition: A | Discharge status: Home / Self Care | Payer: Medicare Other | Attending: Emergency Medicine | Admitting: Emergency Medicine

## 2020-05-27 DIAGNOSIS — Z87891 Personal history of nicotine dependence: Secondary | ICD-10-CM | POA: Diagnosis not present

## 2020-05-27 DIAGNOSIS — Y939 Activity, unspecified: Secondary | ICD-10-CM | POA: Diagnosis not present

## 2020-05-27 DIAGNOSIS — Z85118 Personal history of other malignant neoplasm of bronchus and lung: Secondary | ICD-10-CM | POA: Insufficient documentation

## 2020-05-27 DIAGNOSIS — N189 Chronic kidney disease, unspecified: Secondary | ICD-10-CM | POA: Diagnosis not present

## 2020-05-27 DIAGNOSIS — X58XXXA Exposure to other specified factors, initial encounter: Secondary | ICD-10-CM | POA: Insufficient documentation

## 2020-05-27 DIAGNOSIS — Z79899 Other long term (current) drug therapy: Secondary | ICD-10-CM | POA: Diagnosis not present

## 2020-05-27 DIAGNOSIS — S71132A Puncture wound without foreign body, left thigh, initial encounter: Secondary | ICD-10-CM | POA: Diagnosis present

## 2020-05-27 DIAGNOSIS — S81832A Puncture wound without foreign body, left lower leg, initial encounter: Secondary | ICD-10-CM

## 2020-05-27 DIAGNOSIS — Z853 Personal history of malignant neoplasm of breast: Secondary | ICD-10-CM | POA: Diagnosis not present

## 2020-05-27 DIAGNOSIS — I129 Hypertensive chronic kidney disease with stage 1 through stage 4 chronic kidney disease, or unspecified chronic kidney disease: Secondary | ICD-10-CM | POA: Insufficient documentation

## 2020-05-27 DIAGNOSIS — E1122 Type 2 diabetes mellitus with diabetic chronic kidney disease: Secondary | ICD-10-CM | POA: Insufficient documentation

## 2020-05-27 DIAGNOSIS — Y92009 Unspecified place in unspecified non-institutional (private) residence as the place of occurrence of the external cause: Secondary | ICD-10-CM | POA: Diagnosis not present

## 2020-05-27 DIAGNOSIS — Z7984 Long term (current) use of oral hypoglycemic drugs: Secondary | ICD-10-CM | POA: Diagnosis not present

## 2020-05-27 DIAGNOSIS — Y999 Unspecified external cause status: Secondary | ICD-10-CM | POA: Insufficient documentation

## 2020-05-27 MED ORDER — BACITRACIN-NEOMYCIN-POLYMYXIN 400-5-5000 EX OINT
TOPICAL_OINTMENT | CUTANEOUS | Status: AC
  Filled 2020-05-27: qty 1

## 2020-05-27 MED ORDER — NEOSPORIN PLUS PAIN RELIEF MS 3.5-10000-10 EX CREA
TOPICAL_CREAM | Freq: Two times a day (BID) | CUTANEOUS | 0 refills | Status: DC
Start: 2020-05-27 — End: 2021-10-04

## 2020-05-27 MED ORDER — BACITRACIN-NEOMYCIN-POLYMYXIN 400-5-5000 EX OINT
TOPICAL_OINTMENT | Freq: Once | CUTANEOUS | Status: AC
  Administered 2020-05-27: 1 via TOPICAL
  Filled 2020-05-27: qty 1

## 2020-05-27 NOTE — ED Notes (Signed)
Pt given phone, left message with brother to pick her up. Pt wheeled to lobby to wait for ride

## 2020-05-27 NOTE — Discharge Instructions (Addendum)
Follow discharge care instructions.

## 2020-05-27 NOTE — ED Notes (Signed)
bandaid applied to LLE

## 2020-05-27 NOTE — ED Provider Notes (Signed)
[facility] Emergency Department Provider Note   ____________________________________________   First MD Initiated Contact with Patient 05/27/20 1230     (approximate)  I have reviewed the triage vital signs and the nursing notes.   HISTORY  Chief Complaint scratch    HPI Tracy Dickerson is a 79 y.o. female patient presents with superficial wounds to left lower extremity.  Patient states he scraped left lower extremity on a wooden basket at home.  Patient states initially bleeding was hard to control.  Denies loss sensation or loss of function.  Rates her pain as a 10/10.  Described pain as "sore".  Pressure dressing applied at home.         Past Medical History:  Diagnosis Date  . 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 Diabetes mellitus without complication ([place])   . GERD (gastroesophageal reflux disease)   . Heart disease   . Hypertension     Patient Active Problem List   Diagnosis Date Noted  . Dehydration, moderate   . Acute renal failure ([place]) 04/22/2020  . Hyperkalemia   . Septic shock ([place])   . Sepsis due to urinary tract infection ([place]) 03/13/2018  . Acute kidney injury superimposed on chronic kidney disease ([place]) 02/13/2018  . Closed extra-articular fracture of distal tibia 02/02/2018  . Pneumonia 10/29/2017  . Hypotension 01/26/2017    Past Surgical History:  Procedure Laterality Date  . ABDOMINAL HYSTERECTOMY    . APPENDECTOMY    . BREAST SURGERY     breast cancer LEFT  . OPEN REDUCTION INTERNAL FIXATION (ORIF) TIBIA/FIBULA FRACTURE Left 02/03/2018   Procedure: OPEN REDUCTION INTERNAL FIXATION (ORIF) DISTAL TIBIA FRACTURE;  Surgeon: Corky Mull, MD;  Location: [facility] ORS;  Service: Orthopedics;  Laterality: Left;  ankle/lower leg  . TONSILLECTOMY      Prior to Admission medications   Medication Sig Start Date End Date Taking? Authorizing Provider  albuterol (PROAIR HFA) 108 (90 Base) MCG/ACT inhaler Inhale 2 puffs into the  lungs every 4 (four) hours as needed for wheezing or shortness of breath.     [provider]  apixaban (ELIQUIS) 5 MG TABS tablet Take 5 mg by mouth 2 (two) times daily. 06/17/18   [provider]  atorvastatin (LIPITOR) 20 MG tablet Take by mouth. 05/01/20   [provider]  diphenhydramine-acetaminophen (TYLENOL PM) 25-500 MG TABS tablet Take 1-2 tablets by mouth at bedtime as needed (pain or sleep).     [provider]  gabapentin (NEURONTIN) 300 MG capsule Take 2 caspules by mouth at bedtime    [provider]  lisinopril-hydrochlorothiazide (ZESTORETIC) 10-12.5 MG tablet Take 1 tablet by mouth daily. 03/10/20   [provider]  metFORMIN (GLUCOPHAGE) 1000 MG tablet Take 1,000 mg by mouth 2 (two) times daily with a meal.    [provider]  neomycin-polymyxin-pramoxine (NEOSPORIN PLUS) 1 % cream Apply topically 2 (two) times daily. 05/27/20   Sable Feil, PA-C  pramipexole (MIRAPEX) 0.25 MG tablet Take 0.25 mg by mouth at bedtime. 12/23/16   [provider]  SPIRIVA HANDIHALER 18 MCG inhalation capsule Place 1 capsule (18 mcg total) into inhaler and inhale daily. 04/23/20   [name], MD  traMADol (ULTRAM) 50 MG tablet Take 50 mg by mouth 2 (two) times daily as needed.     [provider]  triamterene-hydrochlorothiazide (DYAZIDE) 37.5-25 MG capsule Take 1 capsule by mouth daily.    [provider]  Allergies Oxycodone, Percocet [oxycodone-acetaminophen], and Penicillins  Family History  Problem Relation Age of Onset  . Cancer Mother     Social History Social History   Tobacco Use  . Smoking status: Former Research scientist (life sciences)  . Smokeless tobacco: Never Used  Vaping Use  . Vaping Use: Never used  Substance Use Topics  . Alcohol use: Not Currently  . Drug use: No    Review of Systems Constitutional: No fever/chills Eyes: No visual changes. ENT: No sore throat. Cardiovascular: Denies chest  pain. Respiratory: Denies shortness of breath. Gastrointestinal: No abdominal pain.  No nausea, no vomiting.  No diarrhea.  No constipation. Genitourinary: Negative for dysuria. Musculoskeletal: Negative for back pain. Skin: Negative for rash. Neurological: Negative for headaches, focal weakness or numbness. Endocrine:  Diabetes and hypertension Hematological/Lymphatic:  Allergic/Immunilogical: Oxycodone, Percocet, and penicillin. ____________________________________________   PHYSICAL EXAM:  VITAL SIGNS: ED Triage Vitals  Enc Vitals Group     BP 05/27/20 1207 92/66     Pulse Rate 05/27/20 1207 92     Resp 05/27/20 1207 18     Temp 05/27/20 1207 97.7 F (36.5 C)     Temp Source 05/27/20 1207 Oral     SpO2 05/27/20 1207 97 %     Weight 05/27/20 1208 226 lb (102.5 kg)     Height 05/27/20 1208 5\' 6"  (1.676 m)     Head Circumference --      Peak Flow --      Pain Score 05/27/20 1208 10     Pain Loc --      Pain Edu? --      Excl. in Learned? --    Constitutional: Alert and oriented. Well appearing and in no acute distress. Cardiovascular: Normal rate, regular rhythm. Grossly normal heart sounds.  Good peripheral circulation. Respiratory: Normal respiratory effort.  No retractions. Lungs CTAB. Musculoskeletal: No lower extremity tenderness nor edema.  No joint effusions. Neurologic:  Normal speech and language. No gross focal neurologic deficits are appreciated. No gait instability. Skin: Puncture wound distal third anterior leg.   Psychiatric: Mood and affect are normal. Speech and behavior are normal.  ____________________________________________   LABS (all labs ordered are listed, but only abnormal results are displayed)  Labs Reviewed - No data to display ____________________________________________  EKG   ____________________________________________  RADIOLOGY  ED MD interpretation:    Official radiology report(s): No results  found.  ____________________________________________   PROCEDURES  Procedure(s) performed (including Critical Care):  Procedures   ____________________________________________   INITIAL IMPRESSION / ASSESSMENT AND PLAN / ED COURSE  As part of my medical decision making, I reviewed the following data within the [place]     Patient presents for puncture wound to left lower leg.  No loss sensation or function.  Area was cleaned Neosporin applied, and a bandage.  Patient given discharge care instructions.Tracy Dickerson was evaluated in Emergency Department on 05/27/2020 for the symptoms described in the history of present illness. She was evaluated in the context of the global COVID-19 pandemic, which necessitated consideration that the patient might be at risk for infection with the SARS-CoV-2 virus that causes COVID-19. Institutional protocols and algorithms that pertain to the evaluation of patients at risk for COVID-19 are in a state of rapid change based on information released by regulatory bodies including the CDC and federal and state organizations. These policies and algorithms were followed during the patient's care in the ED.         ____________________________________________  FINAL CLINICAL IMPRESSION(S) / ED DIAGNOSES  Final diagnoses:  Puncture wound of left leg excluding thigh, initial encounter     ED Discharge Orders         Ordered    neomycin-polymyxin-pramoxine (NEOSPORIN PLUS) 1 % cream  2 times daily        05/27/20 1305           Note:  This document was prepared using Dragon voice recognition software and may include unintentional dictation errors.    Sable Feil, PA-C 05/27/20 1312    Blake Divine, MD 05/27/20 1537

## 2020-05-27 NOTE — ED Triage Notes (Addendum)
Arrived EMS. Pt was doing laundry and scraped LLE on basket.  Small pinpoint area of small abrasion/scratch without bleeding noted.

## 2020-05-27 NOTE — ED Notes (Signed)
Pt with small abrasion on LLE. Pt alert, no apparent distress.

## 2020-05-30 ENCOUNTER — Emergency Department: Payer: Medicare Other

## 2020-05-30 ENCOUNTER — Other Ambulatory Visit: Payer: Self-pay

## 2020-05-30 ENCOUNTER — Emergency Department
Admission: EM | Admit: 2020-05-30 | Discharge: 2020-05-30 | Disposition: A | Discharge status: Home / Self Care | Payer: Medicare Other | Attending: Emergency Medicine | Admitting: Emergency Medicine

## 2020-05-30 ENCOUNTER — Encounter: Payer: Self-pay | Admitting: Emergency Medicine

## 2020-05-30 DIAGNOSIS — B372 Candidiasis of skin and nail: Secondary | ICD-10-CM | POA: Diagnosis not present

## 2020-05-30 DIAGNOSIS — I509 Heart failure, unspecified: Secondary | ICD-10-CM | POA: Diagnosis not present

## 2020-05-30 DIAGNOSIS — Z7984 Long term (current) use of oral hypoglycemic drugs: Secondary | ICD-10-CM | POA: Insufficient documentation

## 2020-05-30 DIAGNOSIS — R55 Syncope and collapse: Secondary | ICD-10-CM | POA: Insufficient documentation

## 2020-05-30 DIAGNOSIS — N189 Chronic kidney disease, unspecified: Secondary | ICD-10-CM | POA: Diagnosis not present

## 2020-05-30 DIAGNOSIS — I130 Hypertensive heart and chronic kidney disease with heart failure and stage 1 through stage 4 chronic kidney disease, or unspecified chronic kidney disease: Secondary | ICD-10-CM | POA: Insufficient documentation

## 2020-05-30 DIAGNOSIS — E1122 Type 2 diabetes mellitus with diabetic chronic kidney disease: Secondary | ICD-10-CM | POA: Insufficient documentation

## 2020-05-30 DIAGNOSIS — Z87891 Personal history of nicotine dependence: Secondary | ICD-10-CM | POA: Diagnosis not present

## 2020-05-30 DIAGNOSIS — C3490 Malignant neoplasm of unspecified part of unspecified bronchus or lung: Secondary | ICD-10-CM | POA: Diagnosis not present

## 2020-05-30 DIAGNOSIS — R531 Weakness: Secondary | ICD-10-CM | POA: Diagnosis present

## 2020-05-30 DIAGNOSIS — Z7901 Long term (current) use of anticoagulants: Secondary | ICD-10-CM | POA: Diagnosis not present

## 2020-05-30 DIAGNOSIS — E1159 Type 2 diabetes mellitus with other circulatory complications: Secondary | ICD-10-CM | POA: Diagnosis not present

## 2020-05-30 DIAGNOSIS — C50919 Malignant neoplasm of unspecified site of unspecified female breast: Secondary | ICD-10-CM | POA: Diagnosis not present

## 2020-05-30 DIAGNOSIS — Z79899 Other long term (current) drug therapy: Secondary | ICD-10-CM | POA: Insufficient documentation

## 2020-05-30 LAB — BASIC METABOLIC PANEL
Anion gap: 10 (ref 5–15)
BUN: 32 mg/dL — ABNORMAL HIGH (ref 8–23)
CO2: 25 mmol/L (ref 22–32)
Calcium: 9.2 mg/dL (ref 8.9–10.3)
Chloride: 104 mmol/L (ref 98–111)
Creatinine, Ser: 1.32 mg/dL — ABNORMAL HIGH (ref 0.44–1.00)
GFR calc Af Amer: 45 mL/min — ABNORMAL LOW (ref >60)
GFR calc non Af Amer: 39 mL/min — ABNORMAL LOW (ref >60)
Glucose, Bld: 107 mg/dL — ABNORMAL HIGH (ref 70–99)
Potassium: 3.6 mmol/L (ref 3.5–5.1)
Sodium: 139 mmol/L (ref 135–145)

## 2020-05-30 LAB — URINALYSIS, COMPLETE (UACMP) WITH MICROSCOPIC
Bilirubin Urine: NEGATIVE
Glucose, UA: NEGATIVE mg/dL
Hgb urine dipstick: NEGATIVE
Ketones, ur: NEGATIVE mg/dL
Nitrite: NEGATIVE
Protein, ur: 30 mg/dL — AB
Specific Gravity, Urine: 1.018 (ref 1.005–1.030)
WBC, UA: >50 WBC/hpf — ABNORMAL HIGH (ref 0–5)
pH: 8 (ref 5.0–8.0)

## 2020-05-30 LAB — CBC
HCT: 33.5 % — ABNORMAL LOW (ref 36.0–46.0)
Hemoglobin: 12.1 g/dL (ref 12.0–15.0)
MCH: 35.8 pg — ABNORMAL HIGH (ref 26.0–34.0)
MCHC: 36.1 g/dL — ABNORMAL HIGH (ref 30.0–36.0)
MCV: 99.1 fL (ref 80.0–100.0)
Platelets: 255 10*3/uL (ref 150–400)
RBC: 3.38 MIL/uL — ABNORMAL LOW (ref 3.87–5.11)
RDW: 16.1 % — ABNORMAL HIGH (ref 11.5–15.5)
WBC: 8.7 10*3/uL (ref 4.0–10.5)
nRBC: 0 % (ref 0.0–0.2)

## 2020-05-30 LAB — LACTIC ACID, PLASMA: Lactic Acid, Venous: 1.4 mmol/L (ref 0.5–1.9)

## 2020-05-30 LAB — TROPONIN I (HIGH SENSITIVITY): Troponin I (High Sensitivity): 5 ng/L (ref <18)

## 2020-05-30 MED ORDER — SODIUM CHLORIDE 0.9 % IV BOLUS
1000.0000 mL | Freq: Once | INTRAVENOUS | Status: AC
  Administered 2020-05-30: 1000 mL via INTRAVENOUS

## 2020-05-30 MED ORDER — CLOTRIMAZOLE 1 % EX CREA: 1.0000 "application " | TOPICAL_CREAM | Freq: Two times a day (BID) | CUTANEOUS | 0 refills | Status: AC

## 2020-05-30 NOTE — ED Notes (Signed)
Lab called for blood draw.

## 2020-05-30 NOTE — ED Notes (Signed)
Deneise Lever RN able to get IV but unable to obtain blood for troponin. Called lab to draw troponin.

## 2020-05-30 NOTE — ED Notes (Signed)
Pt ambulatory to toilet with steady gait noted.  

## 2020-05-30 NOTE — ED Notes (Signed)
Pt ambulatory to toilet to collect urine sample. Pt missed cup. Hat placed in toilet for when pt needs to urinate again.

## 2020-05-30 NOTE — ED Notes (Signed)
Pt returned from scans. Ambulatory to toilet to urinate.

## 2020-05-30 NOTE — ED Triage Notes (Signed)
Pt in via EMS from home with c/o being out of antibiotics. Pt took them for a sore under her breast. Pt also almost out of other meds and states that sometimes her legs don't work like they should

## 2020-05-30 NOTE — ED Notes (Signed)
Attempted IV access. Vein blew. Will ask another RN when pt returns from scans.

## 2020-05-30 NOTE — ED Provider Notes (Signed)
[facility] Emergency Department Provider Note ____________________________________________   First MD Initiated Contact with Patient 05/30/20 1507     (approximate)  I have reviewed the triage vital signs and the nursing notes.   HISTORY  Chief Complaint Weakness    HPI Tracy Dickerson is a 79 y.o. female with PMH as noted below who presents with several complaints. Primarily, she reports a near syncopal event this morning in which she fell off a chair and hit her head.  She states she was sitting outside in a [place]unch[place] for several hours and had only had coffee and a little bit of soda today, but no food.  She felt lightheaded, and then like she nearly passed out.  She reports feeling generally somewhat weak and dizzy over the last several days.  She reports a mild cough and states she had some diarrhea.  She denies fever, vomiting, chest pain, or any weakness or numbness.  In addition she reports pain to her neck, back, and whole body after the fall.  She has been able to ambulate.  The patient also reports rashes under both breasts which have been present for a few months.  She has not been treated for these before.  She was recently on antibiotics but this was for a UTI and she does not currently have any urinary symptoms.   Past Medical History:  Diagnosis Date  . 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 Diabetes mellitus without complication ([place])   . GERD (gastroesophageal reflux disease)   . Heart disease   . Hypertension     Patient Active Problem List   Diagnosis Date Noted  . Dehydration, moderate   . Acute renal failure ([place]) 04/22/2020  . Hyperkalemia   . Septic shock ([place])   . Sepsis due to urinary tract infection ([place]) 03/13/2018  . Acute kidney injury superimposed on chronic kidney disease ([place]) 02/13/2018  . Closed extra-articular fracture of distal tibia 02/02/2018  . Pneumonia 10/29/2017  . Hypotension 01/26/2017    Past Surgical  History:  Procedure Laterality Date  . ABDOMINAL HYSTERECTOMY    . APPENDECTOMY    . BREAST SURGERY     breast cancer LEFT  . OPEN REDUCTION INTERNAL FIXATION (ORIF) TIBIA/FIBULA FRACTURE Left 02/03/2018   Procedure: OPEN REDUCTION INTERNAL FIXATION (ORIF) DISTAL TIBIA FRACTURE;  Surgeon: Corky Mull, MD;  Location: [facility] ORS;  Service: Orthopedics;  Laterality: Left;  ankle/lower leg  . TONSILLECTOMY      Prior to Admission medications   Medication Sig Start Date End Date Taking? Authorizing Provider  albuterol (PROAIR HFA) 108 (90 Base) MCG/ACT inhaler Inhale 2 puffs into the lungs every 4 (four) hours as needed for wheezing or shortness of breath.     [provider]  apixaban (ELIQUIS) 5 MG TABS tablet Take 5 mg by mouth 2 (two) times daily. 06/17/18   [provider]  atorvastatin (LIPITOR) 20 MG tablet Take by mouth. 05/01/20   [provider]  clotrimazole (LOTRIMIN) 1 % cream Apply 1 application topically 2 (two) times daily for 14 days. 05/30/20 06/13/20  Arta Silence, MD  diphenhydramine-acetaminophen (TYLENOL PM) 25-500 MG TABS tablet Take 1-2 tablets by mouth at bedtime as needed (pain or sleep).     [provider]  gabapentin (NEURONTIN) 300 MG capsule Take 2 caspules by mouth at bedtime    [provider]  lisinopril-hydrochlorothiazide (ZESTORETIC) 10-12.5 MG tablet Take 1 tablet by mouth daily. 03/10/20  [provider]  metFORMIN (GLUCOPHAGE) 1000 MG tablet Take 1,000 mg by mouth 2 (two) times daily with a meal.    [provider]  neomycin-polymyxin-pramoxine (NEOSPORIN PLUS) 1 % cream Apply topically 2 (two) times daily. 05/27/20   Sable Feil, PA-C  pramipexole (MIRAPEX) 0.25 MG tablet Take 0.25 mg by mouth at bedtime. 12/23/16   [provider]  SPIRIVA HANDIHALER 18 MCG inhalation capsule Place 1 capsule (18 mcg total) into inhaler and inhale daily. 04/23/20   [name], MD  traMADol (ULTRAM) 50 MG  tablet Take 50 mg by mouth 2 (two) times daily as needed.     [provider]  triamterene-hydrochlorothiazide (DYAZIDE) 37.5-25 MG capsule Take 1 capsule by mouth daily.    [provider]    Allergies Oxycodone, Percocet [oxycodone-acetaminophen], and Penicillins  Family History  Problem Relation Age of Onset  . Cancer Mother     Social History Social History   Tobacco Use  . Smoking status: Former Research scientist (life sciences)  . Smokeless tobacco: Never Used  Vaping Use  . Vaping Use: Never used  Substance Use Topics  . Alcohol use: Not Currently  . Drug use: No    Review of Systems  Constitutional: No fever/chills. Positive for weakness.  Eyes: No visual changes. ENT: No sore throat. Cardiovascular: Denies chest pain. Respiratory: No shortness of breath. Gastrointestinal: No vomiting.  Positive for diarrhea.  Genitourinary: Negative for dysuria.  Musculoskeletal: Positive for back pain. Skin: Positive for rash. Neurological: Negative for focal weakness or numbness.   ____________________________________________   PHYSICAL EXAM:  VITAL SIGNS: ED Triage Vitals  Enc Vitals Group     BP 05/30/20 1152 (!) 91/55     Pulse Rate 05/30/20 1152 (!) 109     Resp 05/30/20 1152 18     Temp 05/30/20 1152 98.2 F (36.8 C)     Temp Source 05/30/20 1152 Oral     SpO2 05/30/20 1152 96 %     Weight 05/30/20 1158 226 lb (102.5 kg)     Height 05/30/20 1158 5\' 6"  (1.676 m)     Head Circumference --      Peak Flow --      Pain Score 05/30/20 1158 10     Pain Loc --      Pain Edu? --      Excl. in [place]? --     Constitutional: Alert and oriented. Relatively well appearing and in no acute distress. Eyes: Conjunctivae are normal. EOMI.  PERRLA.  Head: Atraumatic. Nose: No congestion/rhinnorhea. Mouth/Throat: Mucous membranes are moist.   Neck: Normal range of motion. Mild midline spinal tenderness.  Cardiovascular: Normal rate, regular rhythm. Grossly normal heart sounds.   Good peripheral circulation. Respiratory: Normal respiratory effort.  No retractions. Lungs CTAB. Gastrointestinal: Soft and nontender. No distention.  Genitourinary: No flank tenderness. Musculoskeletal: No lower extremity edema.  Extremities warm and well perfused. FROM at both hips and knees.  Mild midline lumbar and thoracic tenderness with no stepoff or crepitus.  Neurologic:  Normal speech and language. Motor intact in all extremities.  Normal coordination.  Skin:  Skin is warm and dry. Bilateral faint erythematous rash with a few raised papules under both breasts.  Psychiatric: Mood and affect are normal. Speech and behavior are normal.  ____________________________________________   LABS (all labs ordered are listed, but only abnormal results are displayed)  Labs Reviewed  BASIC METABOLIC PANEL - Abnormal; Notable for the following components:      Result Value  Glucose, Bld 107 (*)    BUN 32 (*)    Creatinine, Ser 1.32 (*)    GFR calc non Af Amer 39 (*)    GFR calc Af Amer 45 (*)    All other components within normal limits  CBC - Abnormal; Notable for the following components:   RBC 3.38 (*)    HCT 33.5 (*)    MCH 35.8 (*)    MCHC 36.1 (*)    RDW 16.1 (*)    All other components within normal limits  URINALYSIS, COMPLETE (UACMP) WITH MICROSCOPIC - Abnormal; Notable for the following components:   Color, Urine YELLOW (*)    APPearance CLOUDY (*)    Protein, ur 30 (*)    Leukocytes,Ua LARGE (*)    WBC, UA >50 (*)    Bacteria, UA RARE (*)    All other components within normal limits  LACTIC ACID, PLASMA  CBG MONITORING, ED  TROPONIN I (HIGH SENSITIVITY)  TROPONIN I (HIGH SENSITIVITY)   ____________________________________________  EKG  ED ECG REPORT I, Arta Silence, the attending physician, personally viewed and interpreted this ECG.  Date: 05/30/2020 EKG Time: 1157 Rate: 84 Rhythm: normal sinus rhythm with PACs QRS Axis: normal Intervals:  normal ST/T Wave abnormalities: normal Narrative Interpretation: no evidence of acute ischemia ____________________________________________  RADIOLOGY  CXR: No focal infiltrate or edema CT head: No ICH or other acute abnormality CT cervical spine: No acute fracture XR thoracic spine: No acute fracture XR lumbar spine: No acute fracture  ____________________________________________   PROCEDURES  Procedure(s) performed: No  Procedures  Critical Care performed: No ____________________________________________   INITIAL IMPRESSION / ASSESSMENT AND PLAN / ED COURSE  Pertinent labs & imaging results that were available during my care of the patient were reviewed by me and considered in my medical decision making (see chart for details).  79 y/o female with PMH as noted above presents with a near syncopal event resulting in a fall, and also reports generalized weakness and lightheadedness over the last few days along with a rash under both breasts for a few weeks.   I reviewed the past medical records in Epic; the patient was last seen in the ED 2 days ago for a bleeding wound to her left leg, and previously seen last month for an episode of possible sunstroke and for lightheadedness and dysuria on 8/4.  She was treated for a UTI at that time.  On exam she is overall well appearing.  Her vital signs are normal except for borderline hypotension initially which resolved without intervention.  There is no visible trauma.  Neuro exam is nonfocal.  1. Lightheadedness/near syncope: Overall this is consistent with dehydration and likely vasovagal syncope, likely exacerbated by her sitting in the heat and not eating this morning.  She was slightly hypotensive but this has improved.  We will give a fluid bolus.  We will also obtain basic labs and a screening lactate and troponin.  It is possible that she could have her current UTI, however she has no urinary symptoms so my clinical suspicion is  low.  2. Fall/trauma: Since the patient fell and hit her head, given her age and the fact that she is on anticoagulation I will obtain a CT head.  She does have some midline spinal tenderness although my clinical suspicion for fracture is very low.  We will obtain CT of the cervical spine, and x-rays of the lumbar and thoracic spine.  3. Rash: This is consistent with candidal  intertrigo.  I will prescribe clotrimazole.  ----------------------------------------- 6:53 PM on 05/30/2020 -----------------------------------------  The imaging is all negative for acute findings.  Lab work-up is reassuring.  The creatinine is actually slightly improved from prior.  Urinalysis shows greater than 50 WBCs, but only rare bacteria.  Compared to prior urinalyses this is an improvement.  Given that the patient has no active urinary symptoms, no serum leukocytosis, fever, or other clinical symptoms to suggest UTI, there is no indication for recurrent antibiotic treatment.  I checked the old urine culture from last month and it showed Proteus which was susceptible to cephalosporins.  I counseled the patient on the results of the work-up.  She feels comfortable going home.  Given her stable vital signs, consistently normal blood pressure, and overall reassuring work-up, she is stable for discharge at this time.  I suspect most likely dehydration and vasovagal near syncope.  Return precautions given, she expresses understanding.  ____________________________________________   FINAL CLINICAL IMPRESSION(S) / ED DIAGNOSES  Final diagnoses:  Near syncope  Candidal intertrigo      NEW MEDICATIONS STARTED DURING THIS VISIT:  New Prescriptions   CLOTRIMAZOLE (LOTRIMIN) 1 % CREAM    Apply 1 application topically 2 (two) times daily for 14 days.     Note:  This document was prepared using Dragon voice recognition software and may include unintentional dictation errors.   Arta Silence, MD 05/30/20  [phone]

## 2020-05-30 NOTE — ED Triage Notes (Signed)
Pt via ems from home with increasing weakness. Pt states she has been taking antibiotic for a month for a sore under her breast. She reports that she is out of the antibiotic and can't get in touch with her doctor in [place]. She also reports that she is out of her inhaler as well. Pt alert & oriented, nad noted. BP 91/55 at triage.

## 2020-05-30 NOTE — Discharge Instructions (Signed)
Make sure to drink plenty of fluids and eat small meals frequently throughout the day.  You should use the clotrimazole cream on the affected area under breasts twice daily for the next 2 weeks.  Follow-up with your primary care doctor within the next 1 to 2 weeks.  Return to the ER immediately for new, worsening, or persistent weakness or lightheadedness, recurrent episodes of passing out or nearly passing out, pain or burning with urination, urinating frequently, fever, abdominal or flank pain, or any other new or worsening symptoms that concern you.

## 2020-06-21 ENCOUNTER — Encounter: Payer: Self-pay | Admitting: Emergency Medicine

## 2020-06-21 ENCOUNTER — Other Ambulatory Visit: Payer: Self-pay

## 2020-06-21 ENCOUNTER — Emergency Department
Admission: EM | Admit: 2020-06-21 | Discharge: 2020-06-21 | Disposition: A | Discharge status: Home / Self Care | Payer: Medicare Other | Attending: Emergency Medicine | Admitting: Emergency Medicine

## 2020-06-21 DIAGNOSIS — Z79899 Other long term (current) drug therapy: Secondary | ICD-10-CM | POA: Insufficient documentation

## 2020-06-21 DIAGNOSIS — Z7901 Long term (current) use of anticoagulants: Secondary | ICD-10-CM | POA: Diagnosis not present

## 2020-06-21 DIAGNOSIS — I129 Hypertensive chronic kidney disease with stage 1 through stage 4 chronic kidney disease, or unspecified chronic kidney disease: Secondary | ICD-10-CM | POA: Diagnosis not present

## 2020-06-21 DIAGNOSIS — Z87891 Personal history of nicotine dependence: Secondary | ICD-10-CM | POA: Insufficient documentation

## 2020-06-21 DIAGNOSIS — R300 Dysuria: Secondary | ICD-10-CM | POA: Diagnosis not present

## 2020-06-21 DIAGNOSIS — E1122 Type 2 diabetes mellitus with diabetic chronic kidney disease: Secondary | ICD-10-CM | POA: Insufficient documentation

## 2020-06-21 DIAGNOSIS — Z7984 Long term (current) use of oral hypoglycemic drugs: Secondary | ICD-10-CM | POA: Insufficient documentation

## 2020-06-21 DIAGNOSIS — N3001 Acute cystitis with hematuria: Secondary | ICD-10-CM

## 2020-06-21 DIAGNOSIS — R82998 Other abnormal findings in urine: Secondary | ICD-10-CM | POA: Diagnosis not present

## 2020-06-21 DIAGNOSIS — R3989 Other symptoms and signs involving the genitourinary system: Secondary | ICD-10-CM

## 2020-06-21 DIAGNOSIS — N189 Chronic kidney disease, unspecified: Secondary | ICD-10-CM | POA: Diagnosis not present

## 2020-06-21 LAB — URINALYSIS, COMPLETE (UACMP) WITH MICROSCOPIC
Bilirubin Urine: NEGATIVE
Glucose, UA: NEGATIVE mg/dL
Ketones, ur: NEGATIVE mg/dL
Nitrite: NEGATIVE
Protein, ur: 100 mg/dL — AB
Specific Gravity, Urine: 1.016 (ref 1.005–1.030)
pH: 7 (ref 5.0–8.0)

## 2020-06-21 MED ORDER — CEPHALEXIN 500 MG PO CAPS: 500.0000 mg | ORAL_CAPSULE | Freq: Four times a day (QID) | ORAL | 0 refills | Status: AC

## 2020-06-21 NOTE — ED Notes (Signed)
See triage note  States she developed these sx's about 2 weeks ago

## 2020-06-21 NOTE — ED Notes (Signed)
Pt d/c to lobby, needs to call ride. Transporting friend is unavailable [phone], first nurse notified

## 2020-06-21 NOTE — Discharge Instructions (Addendum)
You were seen in the ED because of your burning with urination.  You are being discharged a prescription for Keflex antibiotic to take 4 times daily for the next 5 days.  While at the pharmacy, I would also recommend that you get some cranberry juice and "Azo."  Azo is the name brand of medication for urinary pain relief, the active ingredient is phenazopyridine hydrochloride.  You can get the generic or the name brand, either will work.  Anyone at the pharmacy can help you find this medication, it is over-the-counter.  If you develop any fevers or worsening symptoms despite these medications, please return to the ED.

## 2020-06-21 NOTE — ED Provider Notes (Signed)
[facility] Emergency Department Provider Note ____________________________________________   First MD Initiated Contact with Patient 06/21/20 1503     (approximate)  I have reviewed the triage vital signs and the nursing notes.  HISTORY  Chief Complaint Urinary Tract Infection   HPI Tracy Dickerson is a 79 y.o. femalewho presents to the ED for evaluation of possible UTI.   Chart review indicates history of HTN, HLD, DM and breast/lung cancer.  On Eliquis.  Patient presents to the ED with "couple months" of dysuria and dark-colored urine.  She primarily reports dark yellow-colored urine, but also reports occasional discomfort "where it comes out."  She reports the symptoms have been present for multiple months.   She denies any fevers, flank pain, frontal abdominal pain, chest pain, hematuria, diarrhea, constipation, nausea, vomiting, chest pain, syncope, falls/trauma.  She has no other complaints and reports feeling fine right now.  She reports that she has a sponsor for her brother, who is an alcoholic and lives with the patient.  She reports that she "finally got him squared away" so that she could come today to get evaluated for her chronic dysuria.    Past Medical History:  Diagnosis Date  . Cancer ([place])    Breast and lung  . Diabetes mellitus without complication ([place])   . GERD (gastroesophageal reflux disease)   . Heart disease   . Hypertension     Patient Active Problem List   Diagnosis Date Noted  . Dehydration, moderate   . Acute renal failure ([place]) 04/22/2020  . Hyperkalemia   . Septic shock ([place])   . Sepsis due to urinary tract infection ([place]) 03/13/2018  . Acute kidney injury superimposed on chronic kidney disease ([place]) 02/13/2018  . Closed extra-articular fracture of distal tibia 02/02/2018  . Pneumonia 10/29/2017  . Hypotension 01/26/2017    Past Surgical History:  Procedure Laterality Date  . ABDOMINAL HYSTERECTOMY    .  APPENDECTOMY    . BREAST SURGERY     breast cancer LEFT  . OPEN REDUCTION INTERNAL FIXATION (ORIF) TIBIA/FIBULA FRACTURE Left 02/03/2018   Procedure: OPEN REDUCTION INTERNAL FIXATION (ORIF) DISTAL TIBIA FRACTURE;  Surgeon: Corky Mull, MD;  Location: [facility] ORS;  Service: Orthopedics;  Laterality: Left;  ankle/lower leg  . TONSILLECTOMY      Prior to Admission medications   Medication Sig Start Date End Date Taking? Authorizing Provider  albuterol (PROAIR HFA) 108 (90 Base) MCG/ACT inhaler Inhale 2 puffs into the lungs every 4 (four) hours as needed for wheezing or shortness of breath.     [provider]  apixaban (ELIQUIS) 5 MG TABS tablet Take 5 mg by mouth 2 (two) times daily. 06/17/18   [provider]  atorvastatin (LIPITOR) 20 MG tablet Take by mouth. 05/01/20   [provider]  cephALEXin (KEFLEX) 500 MG capsule Take 1 capsule (500 mg total) by mouth 4 (four) times daily for 5 days. 06/21/20 06/26/20  Vladimir Crofts, MD  diphenhydramine-acetaminophen (TYLENOL PM) 25-500 MG TABS tablet Take 1-2 tablets by mouth at bedtime as needed (pain or sleep).     [provider]  gabapentin (NEURONTIN) 300 MG capsule Take 2 caspules by mouth at bedtime    [provider]  lisinopril-hydrochlorothiazide (ZESTORETIC) 10-12.5 MG tablet Take 1 tablet by mouth daily. 03/10/20   [provider]  metFORMIN (GLUCOPHAGE) 1000 MG tablet Take 1,000 mg by mouth 2 (two) times daily with a meal.    [provider]  neomycin-polymyxin-pramoxine (  NEOSPORIN PLUS) 1 % cream Apply topically 2 (two) times daily. 05/27/20   Sable Feil, PA-C  pramipexole (MIRAPEX) 0.25 MG tablet Take 0.25 mg by mouth at bedtime. 12/23/16   [provider]  SPIRIVA HANDIHALER 18 MCG inhalation capsule Place 1 capsule (18 mcg total) into inhaler and inhale daily. 04/23/20   [name], MD  traMADol (ULTRAM) 50 MG tablet Take 50 mg by mouth 2 (two) times daily as needed.      [provider]  triamterene-hydrochlorothiazide (DYAZIDE) 37.5-25 MG capsule Take 1 capsule by mouth daily.    [provider]    Allergies Oxycodone, Percocet [oxycodone-acetaminophen], and Penicillins  Family History  Problem Relation Age of Onset  . Cancer Mother     Social History Social History   Tobacco Use  . Smoking status: Former Research scientist (life sciences)  . Smokeless tobacco: Never Used  Vaping Use  . Vaping Use: Never used  Substance Use Topics  . Alcohol use: Not Currently  . Drug use: No    Review of Systems  Constitutional: No fever/chills Eyes: No visual changes. ENT: No sore throat. Cardiovascular: Denies chest pain. Respiratory: Denies shortness of breath. Gastrointestinal: No abdominal pain.  No nausea, no vomiting.  No diarrhea.  No constipation. Genitourinary: Positive for dysuria. Musculoskeletal: Negative for back pain. Skin: Negative for rash. Neurological: Negative for headaches, focal weakness or numbness.   ____________________________________________   PHYSICAL EXAM:  VITAL SIGNS: Vitals:   06/21/20 1031  BP: 101/68  Pulse: 91  Resp: 17  Temp: (!) 97.5 F (36.4 C)  SpO2: 97%      Constitutional: Alert and oriented. Well appearing and in no acute distress. Eyes: Conjunctivae are normal. PERRL. EOMI. Head: Atraumatic. Nose: No congestion/rhinnorhea. Mouth/Throat: Mucous membranes are moist.  Oropharynx non-erythematous. Neck: No stridor. No cervical spine tenderness to palpation. Cardiovascular: Normal rate, regular rhythm. Grossly normal heart sounds.  Good peripheral circulation. Respiratory: Normal respiratory effort.  No retractions. Lungs CTAB. Gastrointestinal: Soft , nondistended, nontender to palpation. No abdominal bruits. No CVA tenderness.  Benign abdomen. Musculoskeletal: No lower extremity tenderness nor edema.  No joint effusions. No signs of acute trauma. Neurologic:  Normal speech and language. No gross focal  neurologic deficits are appreciated. No gait instability noted. Skin:  Skin is warm, dry and intact. No rash noted. Psychiatric: Mood and affect are normal. Speech and behavior are normal.  ____________________________________________   LABS (all labs ordered are listed, but only abnormal results are displayed)  Labs Reviewed  URINALYSIS, COMPLETE (UACMP) WITH MICROSCOPIC - Abnormal; Notable for the following components:      Result Value   Color, Urine AMBER (*)    APPearance CLOUDY (*)    Hgb urine dipstick LARGE (*)    Protein, ur 100 (*)    Leukocytes,Ua LARGE (*)    All other components within normal limits  URINE CULTURE   __________________________________________   PROCEDURES and INTERVENTIONS  Procedure(s) performed (including Critical Care):  Procedures  Medications - No data to display  ____________________________________________   MDM / ED COURSE  79 year old female presents the ED for evaluation of isolated chronic dysuria with evidence of acute UTI, and amenable to outpatient management.  Normal vital signs on room air.  Urine demonstrates large leukocytes, but is nitrite negative.  Review of previous urine results and urine cultures indicates a similar UA growing Proteus about 2 months ago, resistant to Macrobid and susceptible to first-generation cephalosporins.  With her symptoms of dysuria and urinalysis with similar infectious  concerns of leukocytes, I think is reasonable to send this urine for culture and empirically treat for acute cystitis with 5 days of Keflex.  Her abdominal exam is benign and she looks well overall.   We discussed return precautions for the ED and patient is medically stable for discharge home.  Clinical Course as of Jun 22 1615  Wed Jun 21, 2020  1607 Patient independently ambulates bathroom   [DS]    Clinical Course User Index [DS] Vladimir Crofts, MD     ____________________________________________   FINAL CLINICAL  IMPRESSION(S) / ED DIAGNOSES  Final diagnoses:  Dysuria  Dark yellow-colored urine     ED Discharge Orders         Ordered    cephALEXin (KEFLEX) 500 MG capsule  4 times daily        06/21/20 1615           Royelle Hinchman   Note:  This document was prepared using Dragon voice recognition software and may include unintentional dictation errors.   Vladimir Crofts, MD 06/21/20 [phone]

## 2020-06-21 NOTE — ED Triage Notes (Signed)
Presents via EMS with dark urine and possible uti

## 2020-06-22 LAB — URINE CULTURE

## 2020-08-10 ENCOUNTER — Emergency Department
Admission: EM | Admit: 2020-08-10 | Discharge: 2020-08-10 | Disposition: A | Discharge status: Home / Self Care | Payer: Medicare Other | Attending: Emergency Medicine | Admitting: Emergency Medicine

## 2020-08-10 ENCOUNTER — Other Ambulatory Visit: Payer: Self-pay

## 2020-08-10 DIAGNOSIS — Z7984 Long term (current) use of oral hypoglycemic drugs: Secondary | ICD-10-CM | POA: Insufficient documentation

## 2020-08-10 DIAGNOSIS — R42 Dizziness and giddiness: Secondary | ICD-10-CM | POA: Diagnosis not present

## 2020-08-10 DIAGNOSIS — R531 Weakness: Secondary | ICD-10-CM | POA: Diagnosis not present

## 2020-08-10 DIAGNOSIS — I10 Essential (primary) hypertension: Secondary | ICD-10-CM | POA: Diagnosis not present

## 2020-08-10 DIAGNOSIS — Z85118 Personal history of other malignant neoplasm of bronchus and lung: Secondary | ICD-10-CM | POA: Diagnosis not present

## 2020-08-10 DIAGNOSIS — Z87891 Personal history of nicotine dependence: Secondary | ICD-10-CM | POA: Insufficient documentation

## 2020-08-10 DIAGNOSIS — Z7901 Long term (current) use of anticoagulants: Secondary | ICD-10-CM | POA: Diagnosis not present

## 2020-08-10 DIAGNOSIS — E119 Type 2 diabetes mellitus without complications: Secondary | ICD-10-CM | POA: Diagnosis not present

## 2020-08-10 DIAGNOSIS — Z853 Personal history of malignant neoplasm of breast: Secondary | ICD-10-CM | POA: Diagnosis not present

## 2020-08-10 DIAGNOSIS — T50B95A Adverse effect of other viral vaccines, initial encounter: Secondary | ICD-10-CM

## 2020-08-10 DIAGNOSIS — Z79899 Other long term (current) drug therapy: Secondary | ICD-10-CM | POA: Diagnosis not present

## 2020-08-10 LAB — COMPREHENSIVE METABOLIC PANEL
ALT: 7 U/L (ref 0–44)
AST: 16 U/L (ref 15–41)
Albumin: 2.7 g/dL — ABNORMAL LOW (ref 3.5–5.0)
Alkaline Phosphatase: 50 U/L (ref 38–126)
Anion gap: 8 (ref 5–15)
BUN: 23 mg/dL (ref 8–23)
CO2: 19 mmol/L — ABNORMAL LOW (ref 22–32)
Calcium: 6.5 mg/dL — ABNORMAL LOW (ref 8.9–10.3)
Chloride: 117 mmol/L — ABNORMAL HIGH (ref 98–111)
Creatinine, Ser: 0.87 mg/dL (ref 0.44–1.00)
GFR, Estimated: >60 mL/min (ref >60)
Glucose, Bld: 66 mg/dL — ABNORMAL LOW (ref 70–99)
Potassium: 3.2 mmol/L — ABNORMAL LOW (ref 3.5–5.1)
Sodium: 144 mmol/L (ref 135–145)
Total Bilirubin: 0.4 mg/dL (ref 0.3–1.2)
Total Protein: 5.1 g/dL — ABNORMAL LOW (ref 6.5–8.1)

## 2020-08-10 LAB — CBC
HCT: 36.6 % (ref 36.0–46.0)
Hemoglobin: 12.3 g/dL (ref 12.0–15.0)
MCH: 34.7 pg — ABNORMAL HIGH (ref 26.0–34.0)
MCHC: 33.6 g/dL (ref 30.0–36.0)
MCV: 103.4 fL — ABNORMAL HIGH (ref 80.0–100.0)
Platelets: 171 10*3/uL (ref 150–400)
RBC: 3.54 MIL/uL — ABNORMAL LOW (ref 3.87–5.11)
RDW: 17.2 % — ABNORMAL HIGH (ref 11.5–15.5)
WBC: 8.5 10*3/uL (ref 4.0–10.5)
nRBC: 0 % (ref 0.0–0.2)

## 2020-08-10 MED ORDER — ONDANSETRON HCL 4 MG/2ML IJ SOLN
4.0000 mg | Freq: Once | INTRAMUSCULAR | Status: AC
  Administered 2020-08-10: 4 mg via INTRAVENOUS
  Filled 2020-08-10: qty 2

## 2020-08-10 MED ORDER — SODIUM CHLORIDE 0.9 % IV BOLUS
1000.0000 mL | Freq: Once | INTRAVENOUS | Status: AC
  Administered 2020-08-10: 1000 mL via INTRAVENOUS

## 2020-08-10 MED ORDER — ACETAMINOPHEN 500 MG PO TABS
1000.0000 mg | ORAL_TABLET | Freq: Once | ORAL | Status: AC
  Administered 2020-08-10: 1000 mg via ORAL
  Filled 2020-08-10: qty 2

## 2020-08-10 NOTE — ED Provider Notes (Signed)
   Patient received in signout from Dr. Kerman Passey for evaluation of dizziness and generalized weakness after her COVID-19 vaccination. Her exam is benign and blood work does demonstrate evidence of a mild metabolic acidosis, likely due to dehydration from her poor p.o. intake today. She received a liter of fluids and then tolerated a full meal tray with resolution of her symptoms. She reports feeling fine and requests discharge. She has residual mild tachycardia with a rate of 100-110 upon my reassessment and at the time of discharge. She declines staying for further fluids and reassessments, indicating that she will follow up with her PCP. We thoroughly discussed return precautions for the ED and patient is medically stable for discharge home.  Clinical Course as of Aug 10 1718  Thu Aug 10, 2020  1705 Reassessed. Patient has completed 1 L of IV fluids and eaten a whole 30 sandwich and a meal. She reports feeling much better and indicates that she feels normal. Denies pain. Reports that she is ready to go home. She still tachycardic and in the low 100s. She denies any chest pain, syncope or shortness of breath. We discussed how high heart rate can be the first sign of more serious illness, and she indicates that she feels fine and requests discharge. We discussed return precautions for the ED.    [DS]    Clinical Course User Index [DS] Vladimir Crofts, MD      Vladimir Crofts, MD 08/10/20 [phone]

## 2020-08-10 NOTE — ED Provider Notes (Signed)
[facility] Emergency Department Provider Note  Time seen: 2:22 PM  I have reviewed the triage vital signs and the nursing notes.   HISTORY  Chief Complaint Dizziness   HPI Tracy Dickerson is a 79 y.o. female with a past medical history of diabetes, gastric reflux, hypertension, presents to the emergency department for weakness/dizziness.  According to the patient she had her second Covid shot yesterday, woke this morning feeling weak and dizzy.  Patient states her symptoms have not really improved so she came to the emergency department for evaluation.  Denies any vomiting or diarrhea did state intermittent nausea.  No fever cough or shortness of breath.  No chest pain or abdominal pain.  Largely negative review of systems otherwise.    Past Medical History:  Diagnosis Date  . Cancer (Tracy Dickerson)    Breast and lung  . Diabetes mellitus without complication (Tracy Dickerson)   . GERD (gastroesophageal reflux disease)   . Heart disease   . Hypertension     Patient Active Problem List   Diagnosis Date Noted  . Dehydration, moderate   . Acute renal failure (Tracy Dickerson) 04/22/2020  . Hyperkalemia   . Septic shock ([place])   . Sepsis due to urinary tract infection ([place]acy Dickerson) 03/13/2018  . Acute kidney injury superimposed on chronic kidney disease ([place]acy Dickerson) 02/13/2018  . Closed extra-articular fracture of distal tibia 02/02/2018  . Pneumonia 10/29/2017  . Hypotension 01/26/2017    Past Surgical History:  Procedure Laterality Date  . ABDOMINAL HYSTERECTOMY    . APPENDECTOMY    . BREAST SURGERY     breast cancer LEFT  . OPEN REDUCTION INTERNAL FIXATION (ORIF) TIBIA/FIBULA FRACTURE Left 02/03/2018   Procedure: OPEN REDUCTION INTERNAL FIXATION (ORIF) DISTAL TIBIA FRACTURE;  Surgeon: Tracy Mull, MD;  Location: [facility] ORS;  Service: Orthopedics;  Laterality: Left;  ankle/lower leg  . TONSILLECTOMY      Prior to Admission medications   Medication Sig Start Date End Date Taking? Authorizing  Provider  albuterol (PROAIR HFA) 108 (90 Base) MCG/ACT inhaler Inhale 2 puffs into the lungs every 4 (four) hours as needed for wheezing or shortness of breath.    Yes [provider]  apixaban (ELIQUIS) 5 MG TABS tablet Take 5 mg by mouth 2 (two) times daily. 06/17/18  Yes [provider]  atorvastatin (LIPITOR) 20 MG tablet Take by mouth. 05/01/20  Yes [provider]  diphenhydramine-acetaminophen (TYLENOL PM) 25-500 MG TABS tablet Take 1-2 tablets by mouth at bedtime as needed (pain or sleep).    Yes [provider]  gabapentin (NEURONTIN) 300 MG capsule Take 2 caspules by mouth at bedtime   Yes [provider]  lisinopril-hydrochlorothiazide (ZESTORETIC) 10-12.5 MG tablet Take 1 tablet by mouth daily. 03/10/20  Yes [provider]  metFORMIN (GLUCOPHAGE) 1000 MG tablet Take 1,000 mg by mouth 2 (two) times daily with a meal.   Yes [provider]  triamterene-hydrochlorothiazide (DYAZIDE) 37.5-25 MG capsule Take 1 capsule by mouth daily.   Yes [provider]  neomycin-polymyxin-pramoxine (NEOSPORIN PLUS) 1 % cream Apply topically 2 (two) times daily. 05/27/20   Sable Feil, PA-C  pramipexole (MIRAPEX) 0.25 MG tablet Take 0.25 mg by mouth at bedtime. 12/23/16   [provider]  SPIRIVA HANDIHALER 18 MCG inhalation capsule Place 1 capsule (18 mcg total) into inhaler and inhale daily. 04/23/20   [name], MD  traMADol (ULTRAM) 50 MG tablet Take 50 mg by mouth 2 (two) times daily as needed.  [provider]    Allergies  Allergen Reactions  . Oxycodone   . Percocet [Oxycodone-Acetaminophen] Itching  . Penicillins Rash    Has patient had a PCN reaction causing immediate rash, facial/tongue/throat swelling, SOB or lightheadedness with hypotension: Yes Has patient had a PCN reaction causing severe rash involving mucus membranes or skin necrosis: Unknown Has patient had a PCN reaction that required  hospitalization: Unknown Has patient had a PCN reaction occurring within the last 10 years: No If all of the above answers are "NO", then may proceed with Cephalosporin use.    Family History  Problem Relation Age of Onset  . Cancer Mother     Social History Social History   Tobacco Use  . Smoking status: Former Research scientist (life sciences)  . Smokeless tobacco: Never Used  Vaping Use  . Vaping Use: Never used  Substance Use Topics  . Alcohol use: Not Currently  . Drug use: No    Review of Systems Constitutional: Negative for fever.  Positive for generalized weakness/dizziness. Cardiovascular: Negative for chest pain. Respiratory: Negative for shortness of breath. Gastrointestinal: Negative for abdominal pain, vomiting and diarrhea. Genitourinary: Negative for urinary compaints Musculoskeletal: Negative for musculoskeletal complaints Neurological: Negative for headache All other ROS negative  ____________________________________________   PHYSICAL EXAM:  VITAL SIGNS: ED Triage Vitals  Enc Vitals Group     BP 08/10/20 1400 115/65     Pulse Rate 08/10/20 1400 (!) 103     Resp 08/10/20 1400 19     Temp 08/10/20 1400 98.6 F (37 C)     Temp Source 08/10/20 1400 Oral     SpO2 08/10/20 1400 98 %     Weight 08/10/20 1357 257 lb 15 oz (117 kg)     Height 08/10/20 1357 5\' 6"  (1.676 m)     Head Circumference --      Peak Flow --      Pain Score 08/10/20 1356 0     Pain Loc --      Pain Edu? --      Excl. in [place]? --    Constitutional: Alert and oriented. Well appearing and in no distress. Eyes: Normal exam ENT      Head: Normocephalic and atraumatic.      Mouth/Throat: Mucous membranes are moist. Cardiovascular: Normal rate, regular rhythm.  Respiratory: Normal respiratory effort without tachypnea nor retractions. Breath sounds are clear Gastrointestinal: Soft and nontender. No distention.   Musculoskeletal: Nontender with normal range of motion in all extremities.  Neurologic:   Normal speech and language. No gross focal neurologic deficits Skin:  Skin is warm, dry and intact.  Psychiatric: Mood and affect are normal.   ____________________________________________    EKG  EKG viewed and interpreted by myself shows sinus tachycardia 104 bpm with a narrow QRS, left axis deviation, largely normal intervals and nonspecific ST changes.  ____________________________________________   INITIAL IMPRESSION / ASSESSMENT AND PLAN / ED COURSE  Pertinent labs & imaging results that were available during my care of the patient were reviewed by me and considered in my medical decision making (see chart for details).   Patient presents emergency department for dizziness/weakness 1 day after receiving her second Covid vaccination.  Largely negative review of systems.  Reassuring physical exam and vitals.  We will dose IV fluids dose Tylenol and Zofran.  We will check labs and continue to closely monitor.  Differential would include vaccine side effect, electrolyte or metabolic abnormality, dehydration, UTI.  Lab work pending.  Patient care  signed out to oncoming provider.  Tracy Dickerson was evaluated in Emergency Department on 08/10/2020 for the symptoms described in the history of present illness. She was evaluated in the context of the global COVID-19 pandemic, which necessitated consideration that the patient might be at risk for infection with the SARS-CoV-2 virus that causes COVID-19. Institutional protocols and algorithms that pertain to the evaluation of patients at risk for COVID-19 are in a state of rapid change based on information released by regulatory bodies including the CDC and federal and state organizations. These policies and algorithms were followed during the patient's care in the ED.  ____________________________________________   FINAL CLINICAL IMPRESSION(S) / ED DIAGNOSES  Weakness   Harvest Dark, MD 08/10/20 1424

## 2020-08-10 NOTE — Discharge Instructions (Signed)
Use Tylenol for pain and fevers.  Up to 1000 mg per dose, up to 4 times per day.  Do not take more than 4000 mg of Tylenol/acetaminophen within 24 hours..  Push fluids at home to maintain your hydration and ensure that you are eating  Return to the ED with any worsening symptoms, passing out or strokelike symptoms

## 2020-08-10 NOTE — ED Triage Notes (Addendum)
Pt reports she received her 2nd COVID vaccination yesterday and since has been feeling dizziness. She felt the same with her last COVID shot in March. Denies falls. EMS reports CBG en route was 114

## 2020-08-13 ENCOUNTER — Other Ambulatory Visit: Payer: Self-pay

## 2020-08-13 ENCOUNTER — Observation Stay
Admission: EM | Admit: 2020-08-13 | Discharge: 2020-08-15 | Disposition: A | Discharge status: Home / Self Care | Payer: Medicare Other | Attending: Internal Medicine | Admitting: Internal Medicine

## 2020-08-13 ENCOUNTER — Encounter: Payer: Self-pay | Admitting: Emergency Medicine

## 2020-08-13 DIAGNOSIS — N3000 Acute cystitis without hematuria: Secondary | ICD-10-CM

## 2020-08-13 DIAGNOSIS — Z853 Personal history of malignant neoplasm of breast: Secondary | ICD-10-CM | POA: Diagnosis not present

## 2020-08-13 DIAGNOSIS — Z7984 Long term (current) use of oral hypoglycemic drugs: Secondary | ICD-10-CM | POA: Diagnosis not present

## 2020-08-13 DIAGNOSIS — R42 Dizziness and giddiness: Secondary | ICD-10-CM | POA: Diagnosis not present

## 2020-08-13 DIAGNOSIS — Z85118 Personal history of other malignant neoplasm of bronchus and lung: Secondary | ICD-10-CM | POA: Diagnosis not present

## 2020-08-13 DIAGNOSIS — E860 Dehydration: Secondary | ICD-10-CM | POA: Diagnosis not present

## 2020-08-13 DIAGNOSIS — N1831 Chronic kidney disease, stage 3a: Secondary | ICD-10-CM | POA: Diagnosis not present

## 2020-08-13 DIAGNOSIS — E785 Hyperlipidemia, unspecified: Secondary | ICD-10-CM

## 2020-08-13 DIAGNOSIS — Z20822 Contact with and (suspected) exposure to covid-19: Secondary | ICD-10-CM | POA: Insufficient documentation

## 2020-08-13 DIAGNOSIS — I129 Hypertensive chronic kidney disease with stage 1 through stage 4 chronic kidney disease, or unspecified chronic kidney disease: Secondary | ICD-10-CM | POA: Diagnosis not present

## 2020-08-13 DIAGNOSIS — Z79899 Other long term (current) drug therapy: Secondary | ICD-10-CM | POA: Insufficient documentation

## 2020-08-13 DIAGNOSIS — K5900 Constipation, unspecified: Secondary | ICD-10-CM

## 2020-08-13 DIAGNOSIS — I480 Paroxysmal atrial fibrillation: Secondary | ICD-10-CM | POA: Diagnosis present

## 2020-08-13 DIAGNOSIS — N179 Acute kidney failure, unspecified: Secondary | ICD-10-CM | POA: Diagnosis present

## 2020-08-13 DIAGNOSIS — E1122 Type 2 diabetes mellitus with diabetic chronic kidney disease: Secondary | ICD-10-CM | POA: Diagnosis not present

## 2020-08-13 DIAGNOSIS — I959 Hypotension, unspecified: Secondary | ICD-10-CM

## 2020-08-13 DIAGNOSIS — I10 Essential (primary) hypertension: Secondary | ICD-10-CM | POA: Diagnosis present

## 2020-08-13 DIAGNOSIS — R531 Weakness: Secondary | ICD-10-CM | POA: Diagnosis present

## 2020-08-13 DIAGNOSIS — Z87891 Personal history of nicotine dependence: Secondary | ICD-10-CM | POA: Insufficient documentation

## 2020-08-13 DIAGNOSIS — N390 Urinary tract infection, site not specified: Secondary | ICD-10-CM | POA: Diagnosis not present

## 2020-08-13 DIAGNOSIS — Z7901 Long term (current) use of anticoagulants: Secondary | ICD-10-CM | POA: Insufficient documentation

## 2020-08-13 DIAGNOSIS — I951 Orthostatic hypotension: Secondary | ICD-10-CM | POA: Diagnosis present

## 2020-08-13 LAB — CBC
HCT: 32.9 % — ABNORMAL LOW (ref 36.0–46.0)
Hemoglobin: 10.9 g/dL — ABNORMAL LOW (ref 12.0–15.0)
MCH: 32.6 pg (ref 26.0–34.0)
MCHC: 33.1 g/dL (ref 30.0–36.0)
MCV: 98.5 fL (ref 80.0–100.0)
Platelets: 194 10*3/uL (ref 150–400)
RBC: 3.34 MIL/uL — ABNORMAL LOW (ref 3.87–5.11)
RDW: 15.6 % — ABNORMAL HIGH (ref 11.5–15.5)
WBC: 7.7 10*3/uL (ref 4.0–10.5)
nRBC: 0 % (ref 0.0–0.2)

## 2020-08-13 LAB — URINALYSIS, COMPLETE (UACMP) WITH MICROSCOPIC
Bilirubin Urine: NEGATIVE
Glucose, UA: NEGATIVE mg/dL
Ketones, ur: NEGATIVE mg/dL
Nitrite: NEGATIVE
Protein, ur: NEGATIVE mg/dL
Specific Gravity, Urine: 1.011 (ref 1.005–1.030)
WBC, UA: >50 WBC/hpf — ABNORMAL HIGH (ref 0–5)
pH: 6 (ref 5.0–8.0)

## 2020-08-13 LAB — TROPONIN I (HIGH SENSITIVITY)
Troponin I (High Sensitivity): 7 ng/L (ref <18)
Troponin I (High Sensitivity): 7 ng/L (ref <18)

## 2020-08-13 LAB — BASIC METABOLIC PANEL
Anion gap: 11 (ref 5–15)
BUN: 31 mg/dL — ABNORMAL HIGH (ref 8–23)
CO2: 23 mmol/L (ref 22–32)
Calcium: 8.9 mg/dL (ref 8.9–10.3)
Chloride: 106 mmol/L (ref 98–111)
Creatinine, Ser: 1.5 mg/dL — ABNORMAL HIGH (ref 0.44–1.00)
GFR, Estimated: 36 mL/min — ABNORMAL LOW (ref >60)
Glucose, Bld: 100 mg/dL — ABNORMAL HIGH (ref 70–99)
Potassium: 4.4 mmol/L (ref 3.5–5.1)
Sodium: 140 mmol/L (ref 135–145)

## 2020-08-13 LAB — RESP PANEL BY RT-PCR (FLU A&B, COVID) ARPGX2
Influenza A by PCR: NEGATIVE
Influenza B by PCR: NEGATIVE
SARS Coronavirus 2 by RT PCR: NEGATIVE

## 2020-08-13 LAB — GLUCOSE, CAPILLARY
Glucose-Capillary: 95 mg/dL (ref 70–99)
Glucose-Capillary: 103 mg/dL — ABNORMAL HIGH (ref 70–99)

## 2020-08-13 MED ORDER — ONDANSETRON HCL 4 MG/2ML IJ SOLN
4.0000 mg | Freq: Once | INTRAMUSCULAR | Status: AC
  Administered 2020-08-13: 4 mg via INTRAVENOUS
  Filled 2020-08-13: qty 2

## 2020-08-13 MED ORDER — ACETAMINOPHEN 325 MG PO TABS: 650.0000 mg | ORAL_TABLET | Freq: Four times a day (QID) | ORAL | Status: DC | PRN

## 2020-08-13 MED ORDER — DIPHENHYDRAMINE-APAP (SLEEP) 25-500 MG PO TABS: 1.0000 | ORAL_TABLET | Freq: Every evening | ORAL | Status: DC | PRN

## 2020-08-13 MED ORDER — INSULIN ASPART 100 UNIT/ML ~~LOC~~ SOLN: 0.0000 [IU] | Freq: Three times a day (TID) | SUBCUTANEOUS | Status: DC

## 2020-08-13 MED ORDER — DIPHENHYDRAMINE HCL 25 MG PO CAPS
25.0000 mg | ORAL_CAPSULE | Freq: Every day | ORAL | Status: DC
  Administered 2020-08-13 – 2020-08-14 (×2): 25 mg via ORAL
  Filled 2020-08-13 (×2): qty 1

## 2020-08-13 MED ORDER — SODIUM CHLORIDE 0.9 % IV BOLUS
500.0000 mL | Freq: Once | INTRAVENOUS | Status: AC
  Administered 2020-08-13: 500 mL via INTRAVENOUS

## 2020-08-13 MED ORDER — GABAPENTIN 300 MG PO CAPS
600.0000 mg | ORAL_CAPSULE | Freq: Every day | ORAL | Status: DC
  Administered 2020-08-13 – 2020-08-14 (×2): 600 mg via ORAL
  Filled 2020-08-13 (×2): qty 2

## 2020-08-13 MED ORDER — ATORVASTATIN CALCIUM 20 MG PO TABS
20.0000 mg | ORAL_TABLET | Freq: Every day | ORAL | Status: DC
  Administered 2020-08-13 – 2020-08-15 (×3): 20 mg via ORAL
  Filled 2020-08-13 (×3): qty 1

## 2020-08-13 MED ORDER — APIXABAN 5 MG PO TABS
5.0000 mg | ORAL_TABLET | Freq: Two times a day (BID) | ORAL | Status: DC
  Administered 2020-08-13 – 2020-08-15 (×4): 5 mg via ORAL
  Filled 2020-08-13 (×4): qty 1

## 2020-08-13 MED ORDER — SODIUM CHLORIDE 0.9 % IV SOLN
2.0000 g | INTRAVENOUS | Status: DC
  Administered 2020-08-13: 2 g via INTRAVENOUS
  Filled 2020-08-13: qty 2

## 2020-08-13 MED ORDER — ACETAMINOPHEN 500 MG PO TABS
500.0000 mg | ORAL_TABLET | Freq: Every day | ORAL | Status: DC
  Administered 2020-08-13 – 2020-08-14 (×2): 500 mg via ORAL
  Filled 2020-08-13 (×2): qty 1

## 2020-08-13 MED ORDER — INSULIN ASPART 100 UNIT/ML ~~LOC~~ SOLN: 0.0000 [IU] | Freq: Every day | SUBCUTANEOUS | Status: DC

## 2020-08-13 MED ORDER — ONDANSETRON HCL 4 MG/2ML IJ SOLN: 4.0000 mg | Freq: Three times a day (TID) | INTRAMUSCULAR | Status: DC | PRN

## 2020-08-13 MED ORDER — SODIUM CHLORIDE 0.9 % IV SOLN: INTRAVENOUS | Status: DC

## 2020-08-13 MED ORDER — TIOTROPIUM BROMIDE MONOHYDRATE 18 MCG IN CAPS
1.0000 | ORAL_CAPSULE | Freq: Every day | RESPIRATORY_TRACT | Status: DC
  Administered 2020-08-14 – 2020-08-15 (×2): 18 ug via RESPIRATORY_TRACT
  Filled 2020-08-13: qty 5

## 2020-08-13 MED ORDER — SODIUM CHLORIDE 0.9 % IV BOLUS
1000.0000 mL | Freq: Once | INTRAVENOUS | Status: AC
  Administered 2020-08-13: 1000 mL via INTRAVENOUS

## 2020-08-13 MED ORDER — TRAMADOL HCL 50 MG PO TABS: 50.0000 mg | ORAL_TABLET | Freq: Two times a day (BID) | ORAL | Status: DC | PRN

## 2020-08-13 MED ORDER — ALBUTEROL SULFATE (2.5 MG/3ML) 0.083% IN NEBU: 2.5000 mg | INHALATION_SOLUTION | RESPIRATORY_TRACT | Status: DC | PRN

## 2020-08-13 NOTE — ED Notes (Signed)
Called to give report, Network engineer stated that RN is at lunch, but to go ahead and send patient up.

## 2020-08-13 NOTE — ED Triage Notes (Addendum)
Pt arrived via [facility] from home with reports of receiving COVID booster on 11/17 pt c/o dizziness, weakness, headache.  Pt reports today she was feeling dizzy while standing.  Pt c/o soreness still to injection site

## 2020-08-13 NOTE — ED Provider Notes (Signed)
[facility] Emergency Department Provider Note   ____________________________________________   First MD Initiated Contact with Patient 08/13/20 1144     (approximate)  I have reviewed the triage vital signs and the nursing notes.   HISTORY  Chief Complaint Weakness and Dizziness    HPI Tracy Dickerson is a 79 y.o. female with past medical history of hypertension, diabetes, GERD, paroxysmal A. fib on anticoagulation who presents to the ED for weakness and dizziness.  Patient reports that she has been feeling dizzy and lightheaded ever since receiving her Covid booster shot 3 days ago.  She reports feeling lightheaded especially when she goes to stand up, has also been dealing with nausea, occasional vomiting, and poor appetite.  She denies any associated abdominal pain or diarrhea, but does admit to some dysuria.  She has not had any fevers or flank pain, also denies any cough, chest pain, or shortness of breath.  She was seen in the ED for similar symptoms 2 days ago with unremarkable work-up, states she has continued to feel nauseous and has not been able to drink much since then.        Past Medical History:  Diagnosis Date  . Cancer ([place])    Breast and lung  . Diabetes mellitus without complication ([place])   . GERD (gastroesophageal reflux disease)   . Heart disease   . Hypertension     Patient Active Problem List   Diagnosis Date Noted  . UTI (urinary tract infection) 08/13/2020  . Dehydration, moderate   . Acute renal failure ([place]) 04/22/2020  . Hyperkalemia   . Septic shock ([place])   . Sepsis due to urinary tract infection ([place]) 03/13/2018  . Acute kidney injury superimposed on chronic kidney disease ([place]) 02/13/2018  . Closed extra-articular fracture of distal tibia 02/02/2018  . Pneumonia 10/29/2017  . Hypotension 01/26/2017    Past Surgical History:  Procedure Laterality Date  . ABDOMINAL HYSTERECTOMY    . APPENDECTOMY    . BREAST  SURGERY     breast cancer LEFT  . OPEN REDUCTION INTERNAL FIXATION (ORIF) TIBIA/FIBULA FRACTURE Left 02/03/2018   Procedure: OPEN REDUCTION INTERNAL FIXATION (ORIF) DISTAL TIBIA FRACTURE;  Surgeon: Corky Mull, MD;  Location: [facility] ORS;  Service: Orthopedics;  Laterality: Left;  ankle/lower leg  . TONSILLECTOMY      Prior to Admission medications   Medication Sig Start Date End Date Taking? Authorizing Provider  albuterol (PROAIR HFA) 108 (90 Base) MCG/ACT inhaler Inhale 2 puffs into the lungs every 4 (four) hours as needed for wheezing or shortness of breath.     [provider]  apixaban (ELIQUIS) 5 MG TABS tablet Take 5 mg by mouth 2 (two) times daily. 06/17/18   [provider]  atorvastatin (LIPITOR) 20 MG tablet Take by mouth. 05/01/20   [provider]  diphenhydramine-acetaminophen (TYLENOL PM) 25-500 MG TABS tablet Take 1-2 tablets by mouth at bedtime as needed (pain or sleep).     [provider]  gabapentin (NEURONTIN) 300 MG capsule Take 2 caspules by mouth at bedtime    [provider]  lisinopril-hydrochlorothiazide (ZESTORETIC) 10-12.5 MG tablet Take 1 tablet by mouth daily. 03/10/20   [provider]  metFORMIN (GLUCOPHAGE) 1000 MG tablet Take 1,000 mg by mouth 2 (two) times daily with a meal.    [provider]  neomycin-polymyxin-pramoxine (NEOSPORIN PLUS) 1 % cream Apply topically 2 (two) times daily. 05/27/20   Sable Feil, PA-C  pramipexole (MIRAPEX)  0.25 MG tablet Take 0.25 mg by mouth at bedtime. 12/23/16   [provider]  SPIRIVA HANDIHALER 18 MCG inhalation capsule Place 1 capsule (18 mcg total) into inhaler and inhale daily. 04/23/20   [name], MD  traMADol (ULTRAM) 50 MG tablet Take 50 mg by mouth 2 (two) times daily as needed.     [provider]  triamterene-hydrochlorothiazide (DYAZIDE) 37.5-25 MG capsule Take 1 capsule by mouth daily.    [provider]     Allergies Oxycodone, Percocet [oxycodone-acetaminophen], and Penicillins  Family History  Problem Relation Age of Onset  . Cancer Mother     Social History Social History   Tobacco Use  . Smoking status: Former Research scientist (life sciences)  . Smokeless tobacco: Never Used  Vaping Use  . Vaping Use: Never used  Substance Use Topics  . Alcohol use: Not Currently  . Drug use: No    Review of Systems  Constitutional: No fever/chills.  Positive for lightheadedness and dizziness. Eyes: No visual changes. ENT: No sore throat. Cardiovascular: Denies chest pain. Respiratory: Denies shortness of breath. Gastrointestinal: No abdominal pain.  No nausea, no vomiting.  No diarrhea.  No constipation. Genitourinary: Positive for dysuria. Musculoskeletal: Negative for back pain. Skin: Negative for rash. Neurological: Negative for headaches, focal weakness or numbness.  ____________________________________________   PHYSICAL EXAM:  VITAL SIGNS: ED Triage Vitals  Enc Vitals Group     BP 08/13/20 1117 (!) 72/42     Pulse Rate 08/13/20 1117 89     Resp 08/13/20 1117 18     Temp 08/13/20 1117 98.3 F (36.8 C)     Temp Source 08/13/20 1117 Oral     SpO2 08/13/20 1117 94 %     Weight 08/13/20 1125 258 lb (117 kg)     Height 08/13/20 1125 5\' 6"  (1.676 m)     Head Circumference --      Peak Flow --      Pain Score 08/13/20 1125 8     Pain Loc --      Pain Edu? --      Excl. in [place]? --     Constitutional: Alert and oriented. Eyes: Conjunctivae are normal. Head: Atraumatic. Nose: No congestion/rhinnorhea. Mouth/Throat: Mucous membranes are dry. Neck: Normal ROM Cardiovascular: Normal rate, regular rhythm. Grossly normal heart sounds. Respiratory: Normal respiratory effort.  No retractions. Lungs CTAB. Gastrointestinal: Soft and nontender. No distention. Genitourinary: deferred Musculoskeletal: No lower extremity tenderness nor edema. Neurologic:  Normal speech and language. No gross focal  neurologic deficits are appreciated. Skin:  Skin is warm, dry and intact. No rash noted. Psychiatric: Mood and affect are normal. Speech and behavior are normal.  ____________________________________________   LABS (all labs ordered are listed, but only abnormal results are displayed)  Labs Reviewed  BASIC METABOLIC PANEL - Abnormal; Notable for the following components:      Result Value   Glucose, Bld 100 (*)    BUN 31 (*)    Creatinine, Ser 1.50 (*)    GFR, Estimated 36 (*)    All other components within normal limits  CBC - Abnormal; Notable for the following components:   RBC 3.34 (*)    Hemoglobin 10.9 (*)    HCT 32.9 (*)    RDW 15.6 (*)    All other components within normal limits  URINALYSIS, COMPLETE (UACMP) WITH MICROSCOPIC - Abnormal; Notable for the following components:   Color, Urine YELLOW (*)    APPearance CLOUDY (*)    Hgb  urine dipstick SMALL (*)    Leukocytes,Ua LARGE (*)    WBC, UA >50 (*)    Bacteria, UA MANY (*)    All other components within normal limits  RESP PANEL BY RT-PCR (FLU A&B, COVID) ARPGX2  URINE CULTURE  TROPONIN I (HIGH SENSITIVITY)  TROPONIN I (HIGH SENSITIVITY)   ____________________________________________  EKG  ED ECG REPORT I, Blake Divine, the attending physician, personally viewed and interpreted this ECG.   Date: 08/13/2020  EKG Time: 11:11  Rate: 92  Rhythm: normal sinus rhythm  Axis: Normal  Intervals:none  ST&T Change: None   PROCEDURES  Procedure(s) performed (including Critical Care):  Procedures   ____________________________________________   INITIAL IMPRESSION / ASSESSMENT AND PLAN / ED COURSE       79 year old female with past medical history of hypertension, diabetes, GERD, and atrial fibrillation on anticoagulation who presents to the ED for lightheaded and dizziness for the past 3 days after receiving her COVID-19 vaccination.  Patient noted to be hypertensive initially upon arrival and IV  fluid bolus was started.  Blood pressure now seems to be gradually improving with MAP greater than 65.  EKG shows no evidence of arrhythmia or ischemia, we will check troponin but low suspicion for ACS.  She has no fever or leukocytosis, and I have a low suspicion for sepsis, but she does report urinary symptoms and we will check UA.  Labs thus far remarkable for mild AKI, we will continue to monitor blood pressure following IV fluid hydration.  Blood pressure remains improved following IV fluids,, however patient continues to complain of lightheadedness and generalized weakness.  UA is positive for UTI and case discussed with hospitalist for admission given AKI and UTI.  Dr. Blaine Hamper states he will order antibiotics for UTI.      ____________________________________________   FINAL CLINICAL IMPRESSION(S) / ED DIAGNOSES  Final diagnoses:  Lightheadedness  Dehydration  Acute cystitis without hematuria     ED Discharge Orders    None       Note:  This document was prepared using Dragon voice recognition software and may include unintentional dictation errors.   Blake Divine, MD 08/13/20 [phone]

## 2020-08-13 NOTE — ED Notes (Signed)
Advised nurse that patient has assigned bed 

## 2020-08-13 NOTE — Plan of Care (Signed)
Continuing with plan of care. 

## 2020-08-13 NOTE — H&P (Signed)
History and Physical    Tracy Dickerson:[id number] DOB: Jul 17, 1941 DOA: 08/13/2020  Referring MD/NP/PA:   PCP: Toni Arthurs, PA   Patient coming from:  The patient is coming from home.  At baseline, pt is independent for most of ADL.        Chief Complaint: Generalized weakness, lightheadedness, burning on urination  HPI: Tracy Dickerson is a 79 y.o. female with medical history significant of hypertension, hyperlipidemia, diabetes mellitus, GERD, breast cancer, CKD-3a, septic shock due to UTI, PAF on Eliquis, who presents with generalized weakness and lightheadedness, burning on urination  Patient states that she received Covid booster shot 3 days ago, and developed generalized weakness and lightheadedness.  No unilateral numbness or tingling his extremities.  No facial droop or slurred speech. She also has some nausea and occasional vomiting. No diarrhea or abdominal pain. She has poor appetite and decreased oral intake.  Patient has mild dry cough, and mild shortness of breath, no chest pain.  No fever or chills. Patient has increased urinary frequency and burning on urination, no dysuria.  Patient was initially hypotensive with blood pressure 72/42, which improved to 101/79 after giving 1 L normal saline bolus in ED.   ED Course: pt was found to have WBC 7.7, troponin level 7, 7, negative Covid PCR, positive urinalysis (cloudy appearance, large amount of leukocyte, many bacteria, WBC> 50), worsening renal function, temperature normal.  Patient is placed in MedSurg bed for observation  Review of Systems:   General: no fevers, chills, no body weight gain, has poor appetite, has fatigue and weakness. HEENT: no blurry vision, hearing changes or sore throat Respiratory: has dyspnea, coughing, no wheezing CV: no chest pain, no palpitations GI: has nausea, vomiting, no abdominal pain, diarrhea, constipation GU: has dysuria, has burning on urination, increased urinary frequency, no  hematuria  Ext: no leg edema Neuro: no unilateral weakness, numbness, or tingling, no vision change or hearing loss Skin: no rash, no skin tear. MSK: No muscle spasm, no deformity, no limitation of range of movement in spin Heme: No easy bruising.  Travel history: No recent long distant travel.  Allergy:  Allergies  Allergen Reactions  . Oxycodone   . Percocet [Oxycodone-Acetaminophen] Itching  . Penicillins Rash    Has patient had a PCN reaction causing immediate rash, facial/tongue/throat swelling, SOB or lightheadedness with hypotension: Yes Has patient had a PCN reaction causing severe rash involving mucus membranes or skin necrosis: Unknown Has patient had a PCN reaction that required hospitalization: Unknown Has patient had a PCN reaction occurring within the last 10 years: No If all of the above answers are "NO", then may proceed with Cephalosporin use.    Past Medical History:  Diagnosis Date  . Cancer ([place])    Breast and lung  . Diabetes mellitus without complication ([place])   . GERD (gastroesophageal reflux disease)   . Heart disease   . Hypertension     Past Surgical History:  Procedure Laterality Date  . ABDOMINAL HYSTERECTOMY    . APPENDECTOMY    . BREAST SURGERY     breast cancer LEFT  . OPEN REDUCTION INTERNAL FIXATION (ORIF) TIBIA/FIBULA FRACTURE Left 02/03/2018   Procedure: OPEN REDUCTION INTERNAL FIXATION (ORIF) DISTAL TIBIA FRACTURE;  Surgeon: Corky Mull, MD;  Location: [facility] ORS;  Service: Orthopedics;  Laterality: Left;  ankle/lower leg  . TONSILLECTOMY      Social History:  reports that she has quit smoking. She has never used smokeless tobacco. She reports  previous alcohol use. She reports that she does not use drugs.  Family History:  Family History  Problem Relation Age of Onset  . Cancer Mother      Prior to Admission medications   Medication Sig Start Date End Date Taking? Authorizing Provider  albuterol (PROAIR HFA) 108 (90 Base) MCG/ACT  inhaler Inhale 2 puffs into the lungs every 4 (four) hours as needed for wheezing or shortness of breath.     [provider]  apixaban (ELIQUIS) 5 MG TABS tablet Take 5 mg by mouth 2 (two) times daily. 06/17/18   [provider]  atorvastatin (LIPITOR) 20 MG tablet Take by mouth. 05/01/20   [provider]  diphenhydramine-acetaminophen (TYLENOL PM) 25-500 MG TABS tablet Take 1-2 tablets by mouth at bedtime as needed (pain or sleep).     [provider]  gabapentin (NEURONTIN) 300 MG capsule Take 2 caspules by mouth at bedtime    [provider]  lisinopril-hydrochlorothiazide (ZESTORETIC) 10-12.5 MG tablet Take 1 tablet by mouth daily. 03/10/20   [provider]  metFORMIN (GLUCOPHAGE) 1000 MG tablet Take 1,000 mg by mouth 2 (two) times daily with a meal.    [provider]  neomycin-polymyxin-pramoxine (NEOSPORIN PLUS) 1 % cream Apply topically 2 (two) times daily. 05/27/20   Sable Feil, PA-C  pramipexole (MIRAPEX) 0.25 MG tablet Take 0.25 mg by mouth at bedtime. 12/23/16   [provider]  SPIRIVA HANDIHALER 18 MCG inhalation capsule Place 1 capsule (18 mcg total) into inhaler and inhale daily. 04/23/20   [name], MD  traMADol (ULTRAM) 50 MG tablet Take 50 mg by mouth 2 (two) times daily as needed.     [provider]  triamterene-hydrochlorothiazide (DYAZIDE) 37.5-25 MG capsule Take 1 capsule by mouth daily.    [provider]    Physical Exam: Vitals:   08/13/20 1310 08/13/20 1330 08/13/20 1415 08/13/20 1430  BP: 108/78 103/62 98/63 101/79  Pulse: 77     Resp: 18     Temp: 98 F (36.7 C)     TempSrc: Oral     SpO2: 98%     Weight:      Height:       General: Not in acute distress.  Dry mucous membrane HEENT:       Eyes: PERRL, EOMI, no scleral icterus.       ENT: No discharge from the ears and nose, no pharynx injection, no tonsillar enlargement.        Neck: No JVD, no bruit, no mass  felt. Heme: No neck lymph node enlargement. Cardiac: S1/S2, RRR, No murmurs, No gallops or rubs. Respiratory: No rales, wheezing, rhonchi or rubs. GI: Soft, nondistended, nontender, no rebound pain, no organomegaly, BS present. GU: No hematuria Ext: No pitting leg edema bilaterally. 1+DP/PT pulse bilaterally. Musculoskeletal: No joint deformities, No joint redness or warmth, no limitation of ROM in spin. Skin: No rashes.  Neuro: Alert, oriented X3, cranial nerves II-XII grossly intact, moves all extremities. Psych: Patient is not psychotic, no suicidal or hemocidal ideation.  Labs on Admission: I have personally reviewed following labs and imaging studies  CBC: Recent Labs  Lab 08/10/20 1509 08/13/20 1121  WBC 8.5 7.7  HGB 12.3 10.9*  HCT 36.6 32.9*  MCV 103.4* 98.5  PLT 171 643   Basic Metabolic Panel: Recent Labs  Lab 08/10/20 1509 08/13/20 1121  NA 144 140  K 3.2* 4.4  CL 117* 106  CO2 19* 23  GLUCOSE 66*  100*  BUN 23 31*  CREATININE 0.87 1.50*  CALCIUM 6.5* 8.9   GFR: Estimated Creatinine Clearance: 40.9 mL/min (A) (by C-G formula based on SCr of 1.5 mg/dL (H)). Liver Function Tests: Recent Labs  Lab 08/10/20 1509  AST 16  ALT 7  ALKPHOS 50  BILITOT 0.4  PROT 5.1*  ALBUMIN 2.7*   No results for input(s): LIPASE, AMYLASE in the last 168 hours. No results for input(s): AMMONIA in the last 168 hours. Coagulation Profile: No results for input(s): INR, PROTIME in the last 168 hours. Cardiac Enzymes: No results for input(s): CKTOTAL, CKMB, CKMBINDEX, TROPONINI in the last 168 hours. BNP (last 3 results) No results for input(s): PROBNP in the last 8760 hours. HbA1C: No results for input(s): HGBA1C in the last 72 hours. CBG: No results for input(s): GLUCAP in the last 168 hours. Lipid Profile: No results for input(s): CHOL, HDL, LDLCALC, TRIG, CHOLHDL, LDLDIRECT in the last 72 hours. Thyroid Function Tests: No results for input(s): TSH, T4TOTAL, FREET4,  T3FREE, THYROIDAB in the last 72 hours. Anemia Panel: No results for input(s): VITAMINB12, FOLATE, FERRITIN, TIBC, IRON, RETICCTPCT in the last 72 hours. Urine analysis:    Component Value Date/Time   COLORURINE YELLOW (A) 08/13/2020 1407   APPEARANCEUR CLOUDY (A) 08/13/2020 1407   APPEARANCEUR Hazy 01/07/2015 1812   LABSPEC 1.011 08/13/2020 1407   LABSPEC 1.014 01/07/2015 1812   PHURINE 6.0 08/13/2020 1407   GLUCOSEU NEGATIVE 08/13/2020 1407   GLUCOSEU Negative 01/07/2015 1812   HGBUR SMALL (A) 08/13/2020 1407   BILIRUBINUR NEGATIVE 08/13/2020 1407   BILIRUBINUR Negative 01/07/2015 [place] 08/13/2020 1407   PROTEINUR NEGATIVE 08/13/2020 1407   NITRITE NEGATIVE 08/13/2020 1407   LEUKOCYTESUR LARGE (A) 08/13/2020 1407   LEUKOCYTESUR Negative 01/07/2015 1812   Sepsis Labs: @LABRCNTIP (procalcitonin:4,lacticidven:4) ) Recent Results (from the past 240 hour(s))  Resp Panel by RT-PCR (Flu A&B, Covid) Nasopharyngeal Swab     Status: None   Collection Time: 08/13/20  1:09 PM   Specimen: Nasopharyngeal Swab; Nasopharyngeal(NP) swabs in vial transport medium  Result Value Ref Range Status   SARS Coronavirus 2 by RT PCR NEGATIVE NEGATIVE Final    Comment: (NOTE) SARS-CoV-2 target nucleic acids are NOT DETECTED.  The SARS-CoV-2 RNA is generally detectable in upper respiratory specimens during the acute phase of infection. The lowest concentration of SARS-CoV-2 viral copies this assay can detect is 138 copies/mL. A negative result does not preclude SARS-Cov-2 infection and should not be used as the sole basis for treatment or other patient management decisions. A negative result may occur with  improper specimen collection/handling, submission of specimen other than nasopharyngeal swab, presence of viral mutation(s) within the areas targeted by this assay, and inadequate number of viral copies(<138 copies/mL). A negative result must be combined with clinical  observations, patient history, and epidemiological information. The expected result is Negative.  Fact Sheet for Patients:  EntrepreneurPulse.com.au  Fact Sheet for Healthcare Providers:  IncredibleEmployment.be  This test is no t yet approved or cleared by the [place] FDA and  has been authorized for detection and/or diagnosis of SARS-CoV-2 by FDA under an Emergency Use Authorization (EUA). This EUA will remain  in effect (meaning this test can be used) for the duration of the COVID-19 declaration under Section 564(b)(1) of the Act, 21 U.S.C.section 360bbb-3(b)(1), unless the authorization is terminated  or revoked sooner.       Influenza A by PCR NEGATIVE NEGATIVE Final   Influenza B by PCR NEGATIVE  NEGATIVE Final    Comment: (NOTE) The Xpert Xpress SARS-CoV-2/FLU/RSV plus assay is intended as an aid in the diagnosis of influenza from Nasopharyngeal swab specimens and should not be used as a sole basis for treatment. Nasal washings and aspirates are unacceptable for Xpert Xpress SARS-CoV-2/FLU/RSV testing.  Fact Sheet for Patients: EntrepreneurPulse.com.au  Fact Sheet for Healthcare Providers: IncredibleEmployment.be  This test is not yet approved or cleared by the [place] FDA and has been authorized for detection and/or diagnosis of SARS-CoV-2 by FDA under an Emergency Use Authorization (EUA). This EUA will remain in effect (meaning this test can be used) for the duration of the COVID-19 declaration under Section 564(b)(1) of the Act, 21 U.S.C. section 360bbb-3(b)(1), unless the authorization is terminated or revoked.  Performed at [facility], [address] [place], [place] [address]      Radiological Exams on Admission: No results found.   EKG: I have personally reviewed.  Sinus rhythm, QTC 455, low voltage, poor R wave progression,  PAC.   Assessment/Plan Principal Problem:   UTI (urinary tract infection) Active Problems:   Hypotension   HLD (hyperlipidemia)   Acute renal failure superimposed on stage 3a chronic kidney disease (HCC)   PAF (paroxysmal atrial fibrillation) (HCC)   HTN (hypertension)  UTI (urinary tract infection): Patient has history of septic shock due to UTI.  Has history of a positive urine culture for Pseudomonas. -Placed on MedSurg bed for position -Cefepime IV -Follow-up blood culture and urine culture  Hypotension: Likely due to dehydration and continuation for blood pressure medications including diuretics.  Patient does not have fever or leukocytosis, does not meet criteria for sepsis. -IV fluid: Total 1.5 L normal saline, followed by 75 cc/h -Monitor blood pressure closely -Hold blood pressure medications  Hypertension: -Hold all blood pressure medications  HLD (hyperlipidemia) -Lipitor  Acute renal failure superimposed on stage 3a chronic kidney disease ([place]): Baseline creatinine is 0.8-1.2.  Her creatinine is 1.50, BUN 31.  Likely due to multifactorial etiology, including UTI, continuation ACE inhibitor, and hypotension for possible ATN. - IVF as above - Follow up renal function by BMP - Avoid using renal toxic medications, hypotension and contrast dye (or carefully use) -Hold lisinopril  PAF (paroxysmal atrial fibrillation) ([place]): Heart rate is 77 currently -Continue Eliquis         DVT ppx: on Eliquis Code Status: Full code Family Communication: not done, no family member is at bed side.    Disposition Plan:  Anticipate discharge back to previous environment Consults called:  none Admission status: Med-surg bed for obs    Status is: Observation  The patient remains OBS appropriate and will d/c before 2 midnights.  Dispo: The patient is from: Home              Anticipated d/c is to: Home              Anticipated d/c date is: 1 day              Patient  currently is not medically stable to d/c.          Date of Service 08/13/2020    Ivor Costa [facility]   If 7PM-7AM, please contact night-coverage [email or web address] 08/13/2020, 3:09 PM

## 2020-08-13 NOTE — Consult Note (Signed)
Pharmacy Antibiotic Note  Tracy Dickerson is a 79 y.o. female admitted on 08/13/2020 with weakness / dizziness since receiving COVID-19 booster shot 3 days prior to presentation. Per chart, patient denies fever / flank pain but does endorse dysuria. 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 Patient has a history of PCN allergy (rash) but has tolerated cephalosporins in the past. Pharmacy has been consulted for cefepime dosing for UTI.    Plan: Will order cefepime 2g IV q24h  Continue to monitor culture results, clinical picture, renal function.  Height: 5\' 6"  (167.6 cm) Weight: 117 kg (258 lb) IBW/kg (Calculated) : 59.3  Temp (24hrs), Avg:98.2 F (36.8 C), Min:98 F (36.7 C), Max:98.3 F (36.8 C)  Recent Labs  Lab 08/10/20 1509 08/13/20 1121  WBC 8.5 7.7  CREATININE 0.87 1.50*    Estimated Creatinine Clearance: 40.9 mL/min (A) (by C-G formula based on SCr of 1.5 mg/dL (H)).    Allergies  Allergen Reactions  . Oxycodone   . Percocet [Oxycodone-Acetaminophen] Itching  . Penicillins Rash    Has patient had a PCN reaction causing immediate rash, facial/tongue/throat swelling, SOB or lightheadedness with hypotension: Yes Has patient had a PCN reaction causing severe rash involving mucus membranes or skin necrosis: Unknown Has patient had a PCN reaction that required hospitalization: Unknown Has patient had a PCN reaction occurring within the last 10 years: No If all of the above answers are "NO", then may proceed with Cephalosporin use.    Antimicrobials this admission: Cefepime 11/21 >>   Dose adjustments this admission: N/A  Microbiology results: 11/21 BCx: pending 11/21 UCx: pending   Thank you for allowing pharmacy to be a part of this patient's care.  Benita Gutter 08/13/2020 3:05 PM

## 2020-08-13 NOTE — ED Notes (Signed)
Called lab to add on troponin to previous labs.

## 2020-08-13 NOTE — Progress Notes (Signed)
Patient stated that she wanted the pneumonia vaccine, as it has been over five years since she last had one.  Patient just had the COVID booster Wednesday, 08/09/20.  Reached out to Dr. Blaine Hamper regarding pneumonia vaccine administration with previous COVID booster who advised to allow PCP to make that decision after discharge.

## 2020-08-14 DIAGNOSIS — N179 Acute kidney failure, unspecified: Secondary | ICD-10-CM | POA: Diagnosis not present

## 2020-08-14 DIAGNOSIS — I959 Hypotension, unspecified: Secondary | ICD-10-CM | POA: Diagnosis not present

## 2020-08-14 DIAGNOSIS — K5900 Constipation, unspecified: Secondary | ICD-10-CM

## 2020-08-14 DIAGNOSIS — E785 Hyperlipidemia, unspecified: Secondary | ICD-10-CM | POA: Diagnosis not present

## 2020-08-14 DIAGNOSIS — N3000 Acute cystitis without hematuria: Secondary | ICD-10-CM | POA: Diagnosis not present

## 2020-08-14 DIAGNOSIS — N390 Urinary tract infection, site not specified: Secondary | ICD-10-CM | POA: Diagnosis not present

## 2020-08-14 LAB — GLUCOSE, CAPILLARY
Glucose-Capillary: 85 mg/dL (ref 70–99)
Glucose-Capillary: 101 mg/dL — ABNORMAL HIGH (ref 70–99)
Glucose-Capillary: 91 mg/dL (ref 70–99)
Glucose-Capillary: 114 mg/dL — ABNORMAL HIGH (ref 70–99)

## 2020-08-14 LAB — BASIC METABOLIC PANEL
Anion gap: 8 (ref 5–15)
BUN: 25 mg/dL — ABNORMAL HIGH (ref 8–23)
CO2: 25 mmol/L (ref 22–32)
Calcium: 8.4 mg/dL — ABNORMAL LOW (ref 8.9–10.3)
Chloride: 110 mmol/L (ref 98–111)
Creatinine, Ser: 1.23 mg/dL — ABNORMAL HIGH (ref 0.44–1.00)
GFR, Estimated: 45 mL/min — ABNORMAL LOW (ref >60)
Glucose, Bld: 96 mg/dL (ref 70–99)
Potassium: 4.2 mmol/L (ref 3.5–5.1)
Sodium: 143 mmol/L (ref 135–145)

## 2020-08-14 LAB — CBC
HCT: 31.6 % — ABNORMAL LOW (ref 36.0–46.0)
Hemoglobin: 10.2 g/dL — ABNORMAL LOW (ref 12.0–15.0)
MCH: 31.9 pg (ref 26.0–34.0)
MCHC: 32.3 g/dL (ref 30.0–36.0)
MCV: 98.8 fL (ref 80.0–100.0)
Platelets: 154 10*3/uL (ref 150–400)
RBC: 3.2 MIL/uL — ABNORMAL LOW (ref 3.87–5.11)
RDW: 15.4 % (ref 11.5–15.5)
WBC: 5.2 10*3/uL (ref 4.0–10.5)
nRBC: 0 % (ref 0.0–0.2)

## 2020-08-14 MED ORDER — POLYETHYLENE GLYCOL 3350 17 G PO PACK
17.0000 g | PACK | Freq: Every day | ORAL | Status: DC
  Administered 2020-08-14 – 2020-08-15 (×2): 17 g via ORAL
  Filled 2020-08-14 (×2): qty 1

## 2020-08-14 MED ORDER — SENNOSIDES-DOCUSATE SODIUM 8.6-50 MG PO TABS
2.0000 | ORAL_TABLET | Freq: Two times a day (BID) | ORAL | Status: DC
  Administered 2020-08-14 – 2020-08-15 (×3): 2 via ORAL
  Filled 2020-08-14 (×3): qty 2

## 2020-08-14 MED ORDER — SODIUM CHLORIDE 0.9 % IV SOLN
2.0000 g | Freq: Two times a day (BID) | INTRAVENOUS | Status: DC
  Administered 2020-08-14 – 2020-08-15 (×3): 2 g via INTRAVENOUS
  Filled 2020-08-14 (×4): qty 2

## 2020-08-14 NOTE — Evaluation (Addendum)
Physical Therapy Evaluation Patient Details Name: Tracy Dickerson MRN: [id number] DOB: Jun 07, 1941 Today's Date: 08/14/2020   History of Present Illness  Pt is a 79 y/o F. Pt presented to ED on 08/10/20 with c/o weakness/dizziness reporting she received covid vaccine the day prior. Pt examined & demonstrated evidence of mild metabolic acidosis likely due to dehydration from poor intake that day. Pt treated & discharged home with plans to f/u with PCP. Pt returned to ED on 08/13/20 via EMS with c/o ongoing dizziness, weakness & now HA & occasional N&V. Pt found to be hypotensive & have UTI. PMH: DM, gastric reflux, HTN, lung & breast CA, heart disease, a-fib on anticoagulation, HLD, GERD  Clinical Impression  Prior to admission pt was mod I in home with [facility], RW for community use, & endorses multiple falls in recent past. Pt pleasant & agreeable to tx on this date. Pt completes bed mobility & sit<>stand with mod I. Pt ambulates household distances with SPC with supervision but when asked how she was feeling pt endorses dizziness that worsens with continued gait with CGA so pt assisted to recliner & back to room. VSS. Would check orthostatic vital signs during next tx session. Pt would benefit from ongoing PT treatment to address balance & gait deficits & for stair training as able.  Vitals at end of session: BP = 126/67 mmHg (LUE, sitting), HR = 77 bpm, SPO2 >90% on room air    Follow Up Recommendations Home health PT    Equipment Recommendations  None recommended by PT (pt already has RW & [facility])    Recommendations for Other Services       Precautions / Restrictions Precautions Precautions: Fall Restrictions Weight Bearing Restrictions: No      Mobility  Bed Mobility Overal bed mobility: Modified Independent             General bed mobility comments: with hospital bed features    Transfers Overall transfer level: Modified independent Equipment used: Straight cane Transfers: Sit  to/from Stand              Ambulation/Gait Ambulation/Gait assistance: Supervision;Min guard Gait Distance (Feet): 100 Feet Assistive device: Straight cane Gait Pattern/deviations: Decreased weight shift to left        Stairs            Wheelchair Mobility    Modified Rankin (Stroke Patients Only)       Balance Overall balance assessment: Needs assistance;Mild deficits observed, not formally tested;History of Falls Sitting-balance support: Feet supported Sitting balance-Leahy Scale: Good     Standing balance support: Single extremity supported;During functional activity Standing balance-Leahy Scale: Fair Standing balance comment: supervision for standing with SPC                             Pertinent Vitals/Pain Pain Assessment: No/denies pain    Home Living Family/patient expects to be discharged to:: Private residence Living Arrangements:  (adult brother) Available Help at Discharge: Family;Available 24 hours/day Type of Home: House Home Access: Stairs to enter Entrance Stairs-Rails: Right;Left;Can reach both Entrance Stairs-Number of Steps: 2 Home Layout: One level Home Equipment: Cane - single point;Walker - 2 wheels;Shower seat;Hospital bed      Prior Function Level of Independence: Independent with assistive device(s)         Comments: Mod I with SPC in house, RW in community, bathes & dresses herself, cooks, multiple falls in the past few months  Hand Dominance        Extremity/Trunk Assessment   Upper Extremity Assessment Upper Extremity Assessment: Overall WFL for tasks assessed    Lower Extremity Assessment Lower Extremity Assessment: Overall WFL for tasks assessed       Communication   Communication:  (pt appears HOH as she speaks at a significantly increased volume but denies this)  Cognition Arousal/Alertness: Awake/alert Behavior During Therapy: WFL for tasks assessed/performed Overall Cognitive Status:  Within Functional Limits for tasks assessed                                        General Comments      Exercises     Assessment/Plan    PT Assessment Patient needs continued PT services  PT Problem List Decreased mobility;Decreased activity tolerance;Decreased balance;Decreased strength       PT Treatment Interventions Gait training;Balance training;DME instruction;Therapeutic exercise;Stair training;Neuromuscular re-education;Functional mobility training;Therapeutic activities;Patient/family education    PT Goals (Current goals can be found in the Care Plan section)  Acute Rehab PT Goals Patient Stated Goal: to get better PT Goal Formulation: With patient Time For Goal Achievement: 08/28/20 Potential to Achieve Goals: Good    Frequency Min 2X/week   Barriers to discharge        Co-evaluation               AM-PAC PT "6 Clicks" Mobility  Outcome Measure Help needed turning from your back to your side while in a flat bed without using bedrails?: A Little Help needed moving from lying on your back to sitting on the side of a flat bed without using bedrails?: A Little Help needed moving to and from a bed to a chair (including a wheelchair)?: A Little Help needed standing up from a chair using your arms (e.g., wheelchair or bedside chair)?: None Help needed to walk in hospital room?: A Little Help needed climbing 3-5 steps with a railing? : A Little 6 Click Score: 19    End of Session Equipment Utilized During Treatment: Gait belt Activity Tolerance: Patient tolerated treatment well (pt limited by dizziness) Patient left: in chair;with call bell/phone within reach;with chair alarm set Nurse Communication: Mobility status (dizziness, BP) PT Visit Diagnosis: Unsteadiness on feet (R26.81);History of falling (Z91.81);Other abnormalities of gait and mobility (R26.89)    Time: 1610-9604 PT Time Calculation (min) (ACUTE ONLY): 21 min   Charges:   PT  Evaluation $PT Eval Low Complexity: 1 Low PT Treatments $Therapeutic Activity: 8-22 mins        Lavone Nian, PT, DPT 08/14/20, 12:35 PM   Waunita Schooner 08/14/2020, 12:31 PM

## 2020-08-14 NOTE — Progress Notes (Signed)
Mobility Specialist - Progress Note   08/14/20 1239  Mobility  Activity Ambulated to bathroom  Level of Assistance Standby assist, set-up cues, supervision of patient - no hands on  Assistive Device Cane  Distance Ambulated (ft) 20 ft  Mobility Response Tolerated well  Mobility performed by Mobility specialist  $Mobility charge 1 Mobility    During mobility: 94 HR, 94% SpO2   Pt was sitting in recliner upon arrival utilizing room air. Pt agreed to session. Pt c/o dizziness prior to activity. Pt requesting assistance ambulating to restroom. Pt stood from [place]. Pt ambulated to restroom using SPC and single-hand assist. No LOB noted. Pt was independent in hygiene. Pt ambulated back to recliner with SPC and furniture as assistance. Still no LOB noted. Pt denied weakness or SOB. Pt still c/o dizziness upon return to chair. Overall, pt tolerated session well. Pt was left in recliner with needs in reach and alarm set. NT entered at exit.    [name] Mobility Specialist 08/14/20, 12:44 PM

## 2020-08-14 NOTE — TOC Initial Note (Addendum)
Transition of Care [facility]) - Initial/Assessment Note    Patient Details  Name: Tracy Dickerson MRN: [id number] Date of Birth: [birth year]  Transition of Care [facility]) CM/SW Contact:    Beverly Sessions, RN Phone Number: 08/14/2020, 3:16 PM  Clinical Narrative:                 Patient admitted from home with UTI Patient states that she lives at home with her brother PCP Nicki Reaper - at [facility].  Patient denies issues with transportation.  States that she utilizes [employer] - denies issues obtaining medication  Patient states that she has a RW, can, shower seat, and hospital bed in the home  PT and OT have evaluated patient and recommends home health.  Patient in agreement.  States that she does not have a preference of home health agency. Referral made to [facility] with [place]   Patient states that her brother is going to use the [employer] bus to bring her, her personal walker to the hospital tomorrow, and she intends on taking the link bus home at discharge.    Expected Discharge Plan: [place] Barriers to Discharge: Continued Medical Work up   Patient Goals and CMS Choice        Expected Discharge Plan and Services Expected Discharge Plan: [place]   Discharge Planning Services: CM Consult Post Acute Care Choice: [place] arrangements for the past 2 months: Apartment                           HH Arranged: RN, PT, OT [place] Agency: [place] ([facility]) Date HH Agency Contacted: 08/14/20   Representative spoke with at [place]: [place] Arrangements/Services Living arrangements for the past 2 months: [place] with:: Siblings Patient language and need for interpreter reviewed:: Yes Do you feel safe going back to the place where you live?: Yes      Need for Family Participation in Patient Care: Yes (Comment) Care giver support system in place?: Yes (comment) Current  home services: DME Criminal Activity/Legal Involvement Pertinent to Current Situation/Hospitalization: No - Comment as needed  Activities of Daily Living Home Assistive Devices/Equipment: Cane (specify quad or straight), Walker (specify type) (rollater at home) ADL Screening (condition at time of admission) Patient's cognitive ability adequate to safely complete daily activities?: Yes Is the patient deaf or have difficulty hearing?: Yes Does the patient have difficulty seeing, even when wearing glasses/contacts?: No Does the patient have difficulty concentrating, remembering, or making decisions?: No Patient able to express need for assistance with ADLs?: Yes Does the patient have difficulty dressing or bathing?: No Independently performs ADLs?: Yes (appropriate for developmental age) Does the patient have difficulty walking or climbing stairs?: No Weakness of Legs: Both Weakness of Arms/Hands: None  Permission Sought/Granted                  Emotional Assessment       Orientation: : Oriented to Self, Oriented to Place, Oriented to  Time, Oriented to Situation   Psych Involvement: No (comment)  Admission diagnosis:  Dehydration [E86.0] Lightheadedness [R42] UTI (urinary tract infection) [N39.0] Acute cystitis without hematuria [N30.00] Patient Active Problem List   Diagnosis Date Noted  . UTI (urinary tract infection) 08/13/2020  . HLD (hyperlipidemia) 08/13/2020  . Acute renal failure superimposed on stage 3a chronic kidney disease ([place]) 08/13/2020  . PAF (  paroxysmal atrial fibrillation) ([place]) 08/13/2020  . HTN (hypertension) 08/13/2020  . Dehydration   . Dehydration, moderate   . Acute renal failure ([place]) 04/22/2020  . Hyperkalemia   . Septic shock ([place])   . Sepsis due to urinary tract infection ([place]) 03/13/2018  . Acute kidney injury superimposed on chronic kidney disease ([place]) 02/13/2018  . Closed extra-articular fracture of distal tibia 02/02/2018  . Pneumonia  10/29/2017  . Hypotension 01/26/2017   PCP:  Toni Arthurs, PA Pharmacy:   [facility] [place], [place] - [address] [place] [address] Phone: [phone] Fax: [phone]  [facility] [place], [address] [place] [address] Phone: [phone] Fax: [phone]  [id number] [place], [place] - [place] AT [facility] [address] [place] [address] Phone: [phone] Fax: [phone]     Social Determinants of Health ([place]) Interventions    Readmission Risk Interventions No flowsheet data found.

## 2020-08-14 NOTE — Evaluation (Signed)
Occupational Therapy Evaluation Patient Details Name: Tracy Dickerson MRN: [id number] DOB: 1941/03/17 Today's Date: 08/14/2020    History of Present Illness Pt is a 79 y/o F. Pt presented to ED on 08/10/20 with c/o weakness/dizziness reporting she received covid vaccine the day prior. Pt examined & demonstrated evidence of mild metabolic acidosis likely due to dehydration from poor intake that day. Pt treated & discharged home with plans to f/u with PCP. Pt returned to ED on 08/13/20 via EMS with c/o ongoing dizziness, weakness & now HA & occasional N&V. Pt found to be hypotensive & have UTI. PMH: DM, gastric reflux, HTN, lung & breast CA, heart disease, a-fib on anticoagulation, HLD, GERD   Clinical Impression   Patient presenting with decreased I in self care, balance, functional mobility/transfers, endurance, and safety awareness.  Patient reports living with adult brother in single level home PTA. PT reports they do IADL tasks together and she is able to perform all ADLs on her own with use of SPC or rollator. Pt does not drive and uses [employer] transport.  Patient currently functioning at supervision - min guard with use of SPC. Pt was furniture walking when using [facility] and was advised to consider using rollator for mobility for added support with self care tasks.  Patient will benefit from acute OT to increase overall independence in the areas of ADLs, functional mobility, and safety awareness in order to safely discharge home with family.    Follow Up Recommendations  Home health OT;Supervision - Intermittent    Equipment Recommendations  None recommended by OT       Precautions / Restrictions Precautions Precautions: Fall Restrictions Weight Bearing Restrictions: No      Mobility Bed Mobility Overal bed mobility: Modified Independent    General bed mobility comments: with hospital bed features    Transfers Overall transfer level: Needs assistance Equipment used: Straight  cane Transfers: Sit to/from Stand;Stand Pivot Transfers Sit to Stand: Supervision Stand pivot transfers: Min guard      Balance Overall balance assessment: Needs assistance;History of Falls Sitting-balance support: Feet supported Sitting balance-Leahy Scale: Good     Standing balance support: Single extremity supported;During functional activity Standing balance-Leahy Scale: Fair Standing balance comment: supervision for standing with SPC       ADL either performed or assessed with clinical judgement   ADL Overall ADL's : Needs assistance/impaired Eating/Feeding: Independent   Grooming: Wash/dry hands;Wash/dry face;Oral care;Supervision/safety;Standing      Lower Body Dressing: Min guard;Sitting/lateral leans   Toilet Transfer: Min guard;Ambulation   Toileting- Clothing Manipulation and Hygiene: Min guard;Sit to/from stand  General ADL Comments: Pt observed to be furniture walking with cane in R hand and reaching out to hold onto and grab out for tables and wall fixtures will ambulating and therfore needing min guard with SPC in small areas     Vision Patient Visual Report: No change from baseline              Pertinent Vitals/Pain Pain Assessment: No/denies pain     Hand Dominance Right   Extremity/Trunk Assessment Upper Extremity Assessment Upper Extremity Assessment: Overall WFL for tasks assessed   Lower Extremity Assessment Lower Extremity Assessment: Overall WFL for tasks assessed       Communication Communication Communication: No difficulties   Cognition Arousal/Alertness: Awake/alert Behavior During Therapy: WFL for tasks assessed/performed Overall Cognitive Status: Within Functional Limits for tasks assessed  Home Living Family/patient expects to be discharged to:: Private residence Living Arrangements: Other relatives (adult brother) Available Help at Discharge: Family;Available 24 hours/day Type of Home: House Home  Access: Stairs to enter [employer] of Steps: 2 Entrance Stairs-Rails: Right;Left;Can reach both Home Layout: One level     Bathroom Shower/Tub: Teacher, early years/pre: Standard     Home Equipment: Cane - single point;Walker - 2 wheels;Shower seat;Hospital bed;Bedside commode          Prior Functioning/Environment Level of Independence: Independent with assistive device(s)        Comments: Mod I with SPC in house, RW in community, bathes & dresses herself, cooks, multiple falls in the past few months        OT Problem List: Decreased strength;Decreased activity tolerance;Decreased safety awareness;Decreased knowledge of use of DME or AE;Impaired balance (sitting and/or standing)      OT Treatment/Interventions: Self-care/ADL training;Therapeutic exercise;Therapeutic activities;Cognitive remediation/compensation;DME and/or AE instruction;Patient/family education;Balance training;Energy conservation    OT Goals(Current goals can be found in the care plan section) Acute Rehab OT Goals Patient Stated Goal: to get better OT Goal Formulation: With patient Time For Goal Achievement: 08/28/20 Potential to Achieve Goals: Good ADL Goals Pt Will Perform Grooming: with modified independence;standing Pt Will Perform Lower Body Dressing: with modified independence;sit to/from stand Pt Will Transfer to Toilet: with modified independence;ambulating Pt Will Perform Toileting - Clothing Manipulation and hygiene: with modified independence;sit to/from stand Pt Will Perform Tub/Shower Transfer: Shower transfer;with supervision;ambulating  OT Frequency: Min 1X/week   Barriers to D/C:    none at this time          AM-PAC OT "6 Clicks" Daily Activity     Outcome Measure Help from another person eating meals?: None Help from another person taking care of personal grooming?: None Help from another person toileting, which includes using toliet, bedpan, or urinal?: A  Little Help from another person bathing (including washing, rinsing, drying)?: A Little Help from another person to put on and taking off regular upper body clothing?: None Help from another person to put on and taking off regular lower body clothing?: A Little 6 Click Score: 21   End of Session Equipment Utilized During Treatment: Other (comment) [facility]) Nurse Communication: Mobility status  Activity Tolerance: Patient tolerated treatment well Patient left: in bed;with call bell/phone within reach;with bed alarm set  OT Visit Diagnosis: Unsteadiness on feet (R26.81);Repeated falls (R29.6);Muscle weakness (generalized) (M62.81)                Time: 0177-9390 OT Time Calculation (min): 20 min Charges:  OT General Charges $OT Visit: 1 Visit OT Evaluation $OT Eval Low Complexity: 1 Low OT Treatments $Self Care/Home Management : 8-22 mins  Darleen Crocker, MS, OTR/L , CBIS ascom [phone]  08/14/20, 1:07 PM

## 2020-08-14 NOTE — Progress Notes (Signed)
1        [place] at [place] NAME: Tracy Dickerson    MR#:  [id number]  DATE OF BIRTH:  23-Nov-1940  SUBJECTIVE:  CHIEF COMPLAINT:   Chief Complaint  Patient presents with  . Weakness  . Dizziness  Feels constipated.  Tired, burning urination although feeling better than yesterday REVIEW OF SYSTEMS:  Review of Systems  Constitutional: Positive for malaise/fatigue. Negative for diaphoresis, fever and weight loss.  HENT: Negative for ear discharge, ear pain, hearing loss, nosebleeds, sore throat and tinnitus.   Eyes: Negative for blurred vision and pain.  Respiratory: Negative for cough, hemoptysis, shortness of breath and wheezing.   Cardiovascular: Negative for chest pain, palpitations, orthopnea and leg swelling.  Gastrointestinal: Positive for constipation. Negative for abdominal pain, blood in stool, diarrhea, heartburn, nausea and vomiting.  Genitourinary: Positive for dysuria. Negative for frequency and urgency.  Musculoskeletal: Negative for back pain and myalgias.  Skin: Negative for itching and rash.  Neurological: Negative for dizziness, tingling, tremors, focal weakness, seizures, weakness and headaches.  Psychiatric/Behavioral: Negative for depression. The patient is not nervous/anxious.    DRUG ALLERGIES:   Allergies  Allergen Reactions  . Oxycodone   . Percocet [Oxycodone-Acetaminophen] Itching  . Penicillins Rash    Has patient had a PCN reaction causing immediate rash, facial/tongue/throat swelling, SOB or lightheadedness with hypotension: Yes Has patient had a PCN reaction causing severe rash involving mucus membranes or skin necrosis: Unknown Has patient had a PCN reaction that required hospitalization: Unknown Has patient had a PCN reaction occurring within the last 10 years: No If all of the above answers are "NO", then may proceed with Cephalosporin use.   VITALS:  Blood pressure (!) 115/49, pulse 91, temperature (!) 97.5 F (36.4  C), temperature source Oral, resp. rate 18, height 5\' 6"  (1.676 m), weight 117 kg, SpO2 98 %. PHYSICAL EXAMINATION:  Physical Exam HENT:     Head: Normocephalic and atraumatic.  Eyes:     Conjunctiva/sclera: Conjunctivae normal.     Pupils: Pupils are equal, round, and reactive to light.  Neck:     Thyroid: No thyromegaly.     Trachea: No tracheal deviation.  Cardiovascular:     Rate and Rhythm: Normal rate and regular rhythm.     Heart sounds: Normal heart sounds.  Pulmonary:     Effort: Pulmonary effort is normal. No respiratory distress.     Breath sounds: Normal breath sounds. No wheezing.  Chest:     Chest wall: No tenderness.  Abdominal:     General: Bowel sounds are normal. There is no distension.     Palpations: Abdomen is soft.     Tenderness: There is no abdominal tenderness.  Musculoskeletal:        General: Normal range of motion.     Cervical back: Normal range of motion and neck supple.  Skin:    General: Skin is warm and dry.     Findings: No rash.  Neurological:     Mental Status: She is alert and oriented to person, place, and time.     Cranial Nerves: No cranial nerve deficit.    LABORATORY PANEL:  Female CBC Recent Labs  Lab 08/14/20 0500  WBC 5.2  HGB 10.2*  HCT 31.6*  PLT 154   ------------------------------------------------------------------------------------------------------------------ Chemistries  Recent Labs  Lab 08/10/20 1509 08/13/20 1121 08/14/20 0500  NA 144   < > 143  K 3.2*   < > 4.2  CL  117*   < > 110  CO2 19*   < > 25  GLUCOSE 66*   < > 96  BUN 23   < > 25*  CREATININE 0.87   < > 1.23*  CALCIUM 6.5*   < > 8.4*  AST 16  --   --   ALT 7  --   --   ALKPHOS 50  --   --   BILITOT 0.4  --   --    < > = values in this interval not displayed.   RADIOLOGY:  No results found. ASSESSMENT AND PLAN:  79 year old female with a known history of hypertension, hyperlipidemia, diabetes, GERD, breast cancer, CKD stage IIIa, PAF on  Eliquis is admitted for UTI  UTI Based on UA, pending urine culture.  Continue IV cefepime for now  Hypotension with a history of hypertension Likely from dehydration Improving with hydration  Acute on chronic kidney disease stage IIIa Improving with hydration, hold lisinopril  Constipation Start bowel regimen  PAF On Eliquis for anticoagulation, rate is well controlled  PT/OT recommends home health PT, OT  Morbid obesity: Complicates overall prognosis.  Counseled for weight loss Body mass index is 41.64 kg/m.      Status is: Observation  The patient remains OBS appropriate and will d/c before 2 midnights.  Dispo: The patient is from: Home              Anticipated d/c is to: Home              Anticipated d/c date is: 1 day              Patient currently is not medically stable to d/c.  Pending urine and blood culture.  Also waiting for bowel to get cleaned up as she is severely constipated.   DVT prophylaxis:        apixaban (ELIQUIS) tablet 5 mg     Family Communication: discussed with patient   All the records are reviewed and case discussed with Care Management/Social Worker. Management plans discussed with the patient, nursing and they are in agreement.  CODE STATUS: Full Code  TOTAL TIME TAKING CARE OF THIS PATIENT: 35 minutes.   More than 50% of the time was spent in counseling/coordination of care: YES  POSSIBLE D/C IN 1 DAYS, DEPENDING ON CLINICAL CONDITION.   [name] M.D on 08/14/2020 at 2:27 PM  [facility]   CC: Primary care physician; Toni Arthurs, PA  Note: This dictation was prepared with Dragon dictation along with smaller phrase technology. Any transcriptional errors that result from this process are unintentional.

## 2020-08-14 NOTE — Care Management Obs Status (Signed)
[place] NOTIFICATION   Patient Details  Name: Tracy Dickerson MRN: [id number] Date of Birth: [birth year]   Medicare Observation Status Notification Given:  Yes    Beverly Sessions, RN 08/14/2020, 2:41 PM

## 2020-08-14 NOTE — Consult Note (Signed)
Pharmacy Antibiotic Note  Tracy Dickerson is a 79 y.o. female admitted on 08/13/2020 with weakness / dizziness since receiving COVID-19 booster shot 3 days prior to presentation. Per chart, patient denies fever / flank pain but does endorse dysuria. 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 Patient has a history of PCN allergy (rash) but has tolerated cephalosporins in the past. Pharmacy has been consulted for cefepime dosing for UTI.  Her renal function is notably improved from yesterday but not yet back to her baseline level.  Plan:  Adjust cefepime dose to 2 grams IV every 12 hours   Height: 5\' 6"  (167.6 cm) Weight: 117 kg (258 lb) IBW/kg (Calculated) : 59.3  Temp (24hrs), Avg:98 F (36.7 C), Min:97.8 F (36.6 C), Max:98.3 F (36.8 C)  Recent Labs  Lab 08/10/20 1509 08/13/20 1121 08/14/20 0500  WBC 8.5 7.7 5.2  CREATININE 0.87 1.50* 1.23*    Estimated Creatinine Clearance: 49.8 mL/min (A) (by C-G formula based on SCr of 1.23 mg/dL (H)).    Allergies  Allergen Reactions  . Oxycodone   . Percocet [Oxycodone-Acetaminophen] Itching  . Penicillins Rash    Has patient had a PCN reaction causing immediate rash, facial/tongue/throat swelling, SOB or lightheadedness with hypotension: Yes Has patient had a PCN reaction causing severe rash involving mucus membranes or skin necrosis: Unknown Has patient had a PCN reaction that required hospitalization: Unknown Has patient had a PCN reaction occurring within the last 10 years: No If all of the above answers are "NO", then may proceed with Cephalosporin use.    Antimicrobials this admission: Cefepime 11/21 >>   Microbiology results: 11/21 BCx: NG < 12 hours 11/21 UCx: GNR 11/21 SARS CoV-2: negative 11/21 influenza A/B: negative  Thank you for allowing pharmacy to be a part of this patient's care.  Dallie Piles 08/14/2020 7:13 AM

## 2020-08-15 DIAGNOSIS — K5900 Constipation, unspecified: Secondary | ICD-10-CM | POA: Diagnosis not present

## 2020-08-15 DIAGNOSIS — I159 Secondary hypertension, unspecified: Secondary | ICD-10-CM | POA: Diagnosis not present

## 2020-08-15 DIAGNOSIS — N179 Acute kidney failure, unspecified: Secondary | ICD-10-CM | POA: Diagnosis not present

## 2020-08-15 DIAGNOSIS — N3000 Acute cystitis without hematuria: Secondary | ICD-10-CM | POA: Diagnosis not present

## 2020-08-15 DIAGNOSIS — N390 Urinary tract infection, site not specified: Secondary | ICD-10-CM | POA: Diagnosis not present

## 2020-08-15 LAB — CBC
HCT: 29.9 % — ABNORMAL LOW (ref 36.0–46.0)
Hemoglobin: 9.9 g/dL — ABNORMAL LOW (ref 12.0–15.0)
MCH: 32.2 pg (ref 26.0–34.0)
MCHC: 33.1 g/dL (ref 30.0–36.0)
MCV: 97.4 fL (ref 80.0–100.0)
Platelets: 148 10*3/uL — ABNORMAL LOW (ref 150–400)
RBC: 3.07 MIL/uL — ABNORMAL LOW (ref 3.87–5.11)
RDW: 15.2 % (ref 11.5–15.5)
WBC: 6.1 10*3/uL (ref 4.0–10.5)
nRBC: 0 % (ref 0.0–0.2)

## 2020-08-15 LAB — BASIC METABOLIC PANEL
Anion gap: 7 (ref 5–15)
BUN: 24 mg/dL — ABNORMAL HIGH (ref 8–23)
CO2: 26 mmol/L (ref 22–32)
Calcium: 8.9 mg/dL (ref 8.9–10.3)
Chloride: 110 mmol/L (ref 98–111)
Creatinine, Ser: 1.13 mg/dL — ABNORMAL HIGH (ref 0.44–1.00)
GFR, Estimated: 50 mL/min — ABNORMAL LOW (ref >60)
Glucose, Bld: 93 mg/dL (ref 70–99)
Potassium: 3.9 mmol/L (ref 3.5–5.1)
Sodium: 143 mmol/L (ref 135–145)

## 2020-08-15 LAB — GLUCOSE, CAPILLARY
Glucose-Capillary: 93 mg/dL (ref 70–99)
Glucose-Capillary: 98 mg/dL (ref 70–99)

## 2020-08-15 LAB — URINE CULTURE: Culture: >=100000 — AB

## 2020-08-15 MED ORDER — CEPHALEXIN 250 MG PO CAPS: 250.0000 mg | ORAL_CAPSULE | Freq: Two times a day (BID) | ORAL | 0 refills | Status: AC

## 2020-08-15 MED ORDER — POLYETHYLENE GLYCOL 3350 17 G PO PACK
17.0000 g | PACK | Freq: Every day | ORAL | 0 refills | Status: DC
Start: 2020-08-16 — End: 2023-06-19

## 2020-08-15 MED ORDER — FLEET ENEMA 7-19 GM/118ML RE ENEM
1.0000 | ENEMA | RECTAL | Status: AC
  Administered 2020-08-15: 1 via RECTAL

## 2020-08-15 MED ORDER — SENNOSIDES-DOCUSATE SODIUM 8.6-50 MG PO TABS: 2.0000 | ORAL_TABLET | Freq: Every evening | ORAL | 0 refills | Status: DC | PRN

## 2020-08-15 NOTE — Progress Notes (Signed)
Tracy Dickerson to be D/C'd home with brother per MD order.  Discussed prescriptions and follow up appointments with the patient. Prescriptions given to patient, medication list explained in detail. Pt verbalized understanding.  Allergies as of 08/15/2020       Reactions   Oxycodone    Percocet [oxycodone-acetaminophen] Itching   Penicillins Rash   Has patient had a PCN reaction causing immediate rash, facial/tongue/throat swelling, SOB or lightheadedness with hypotension: Yes Has patient had a PCN reaction causing severe rash involving mucus membranes or skin necrosis: Unknown Has patient had a PCN reaction that required hospitalization: Unknown Has patient had a PCN reaction occurring within the last 10 years: No If all of the above answers are "NO", then may proceed with Cephalosporin use.        Medication List     STOP taking these medications    pramipexole 0.25 MG tablet Commonly known as: MIRAPEX       TAKE these medications    apixaban 5 MG Tabs tablet Commonly known as: ELIQUIS Take 5 mg by mouth 2 (two) times daily.   atorvastatin 20 MG tablet Commonly known as: LIPITOR Take by mouth.   cephALEXin 250 MG capsule Commonly known as: KEFLEX Take 1 capsule (250 mg total) by mouth 2 (two) times daily for 2 days.   diphenhydramine-acetaminophen 25-500 MG Tabs tablet Commonly known as: TYLENOL PM Take 1-2 tablets by mouth at bedtime as needed (pain or sleep).   gabapentin 300 MG capsule Commonly known as: NEURONTIN Take 2 caspules by mouth at bedtime   lisinopril 10 MG tablet Commonly known as: ZESTRIL Take 10 mg by mouth daily.   metFORMIN 1000 MG tablet Commonly known as: GLUCOPHAGE Take 1,000 mg by mouth 2 (two) times daily with a meal.   NEOSPORIN PLUS 1 % cream Generic drug: neomycin-polymyxin-pramoxine Apply topically 2 (two) times daily.   polyethylene glycol 17 g packet Commonly known as: MIRALAX / GLYCOLAX Take 17 g by mouth daily. Start  taking on: August 16, 2020   ProAir HFA 108 (90 Base) MCG/ACT inhaler Generic drug: albuterol Inhale 2 puffs into the lungs every 4 (four) hours as needed for wheezing or shortness of breath.   senna-docusate 8.6-50 MG tablet Commonly known as: Senokot-S Take 2 tablets by mouth at bedtime as needed for mild constipation.   Spiriva HandiHaler 18 MCG inhalation capsule Generic drug: tiotropium Place 1 capsule (18 mcg total) into inhaler and inhale daily.   traMADol 50 MG tablet Commonly known as: ULTRAM Take 50 mg by mouth 2 (two) times daily as needed.        Vitals:   08/15/20 0534 08/15/20 0822  BP: 106/79 132/62  Pulse: 65 65  Resp: 16 16  Temp: 97.7 F (36.5 C) 97.9 F (36.6 C)  SpO2: 99% 100%    Skin clean, dry and intact without evidence of skin break down, no evidence of skin tears noted. IV catheter discontinued intact. Site without signs and symptoms of complications. Dressing and pressure applied. Pt denies pain at this time. No complaints noted.  An After Visit Summary was printed and given to the patient. Patient escorted via [place], and D/C home via [employer].  [place] A Tracy Dickerson

## 2020-08-15 NOTE — TOC Transition Note (Signed)
Transition of Care [facility]) - CM/SW Discharge Note   Patient Details  Name: Tracy Dickerson MRN: [id number] Date of Birth: September 13, 1941  [facility]) CM/SW Contact:  Beverly Sessions, RN Phone Number: 08/15/2020, 12:12 PM   Clinical Narrative:     Patient to return home today [place] with [place] notified   Brother at bedside Patient does not have her walker to safely navigate the bus to get home Patient will discharge home via cab.  Cab voucher provided  Bedside RN notified   Final next level of care: Home w Home Health Services Barriers to Discharge: No Barriers Identified   Patient Goals and CMS Choice        Discharge Placement                       Discharge Plan and Services   Discharge Planning Services: CM Consult Post Acute Care Choice: Home Health                    HH Arranged: RN, PT, OT, Nurse's Aide [place] Agency: [place] ([place]) Date [place]: 08/15/20   Representative spoke with at [place]: [place] ([place]) Interventions     Readmission Risk Interventions No flowsheet data found.

## 2020-08-15 NOTE — Discharge Instructions (Signed)

## 2020-08-15 NOTE — Discharge Summary (Signed)
[facility] [place] NAME: Tracy Dickerson    MR#:  [id number]  DATE OF BIRTH:  1940-12-21  DATE OF ADMISSION:  08/13/2020   ADMITTING PHYSICIAN: Ivor Costa, MD  DATE OF DISCHARGE: 08/15/2020  2:39 PM  PRIMARY CARE PHYSICIAN: Darius Bump B, PA   ADMISSION DIAGNOSIS:  Dehydration [E86.0] Lightheadedness [R42] UTI (urinary tract infection) [N39.0] Acute cystitis without hematuria [N30.00] DISCHARGE DIAGNOSIS:  Principal Problem:   UTI (urinary tract infection) Active Problems:   Hypotension   HLD (hyperlipidemia)   Acute renal failure superimposed on stage 3a chronic kidney disease (HCC)   PAF (paroxysmal atrial fibrillation) (HCC)   HTN (hypertension)   Constipation  SECONDARY DIAGNOSIS:   Past Medical History:  Diagnosis Date  . Cancer ([place])    Breast and lung  . Diabetes mellitus without complication ([place])   . GERD (gastroesophageal reflux disease)   . Heart disease   . Hypertension    HOSPITAL COURSE:  79 year old female with a known history of hypertension, hyperlipidemia, diabetes, GERD, breast cancer, CKD stage IIIa, PAF on Eliquis is admitted for UTI  UTI Urine culture growing Proteus.  Responsive to treatment.  Hypotension with a history of hypertension Likely from dehydration Resolved with hydration  Acute on chronic kidney disease stage IIIa Improved with hydration, resume lisinopril at discharge  Constipation Resolved with bowel regimen.  She required Fleet enema as she was severely constipated.  PAF On Eliquis for anticoagulation, rate is well controlled    DISCHARGE CONDITIONS:  Stable CONSULTS OBTAINED:   DRUG ALLERGIES:   Allergies  Allergen Reactions  . Oxycodone   . Percocet [Oxycodone-Acetaminophen] Itching  . Penicillins Rash    Has patient had a PCN reaction causing immediate rash, facial/tongue/throat swelling, SOB or lightheadedness with hypotension: Yes Has patient had a PCN reaction  causing severe rash involving mucus membranes or skin necrosis: Unknown Has patient had a PCN reaction that required hospitalization: Unknown Has patient had a PCN reaction occurring within the last 10 years: No If all of the above answers are "NO", then may proceed with Cephalosporin use.   DISCHARGE MEDICATIONS:   Allergies as of 08/15/2020      Reactions   Oxycodone    Percocet [oxycodone-acetaminophen] Itching   Penicillins Rash   Has patient had a PCN reaction causing immediate rash, facial/tongue/throat swelling, SOB or lightheadedness with hypotension: Yes Has patient had a PCN reaction causing severe rash involving mucus membranes or skin necrosis: Unknown Has patient had a PCN reaction that required hospitalization: Unknown Has patient had a PCN reaction occurring within the last 10 years: No If all of the above answers are "NO", then may proceed with Cephalosporin use.      Medication List    STOP taking these medications   pramipexole 0.25 MG tablet Commonly known as: MIRAPEX     TAKE these medications   apixaban 5 MG Tabs tablet Commonly known as: ELIQUIS Take 5 mg by mouth 2 (two) times daily.   atorvastatin 20 MG tablet Commonly known as: LIPITOR Take by mouth.   cephALEXin 250 MG capsule Commonly known as: KEFLEX Take 1 capsule (250 mg total) by mouth 2 (two) times daily for 2 days.   diphenhydramine-acetaminophen 25-500 MG Tabs tablet Commonly known as: TYLENOL PM Take 1-2 tablets by mouth at bedtime as needed (pain or sleep).   gabapentin 300 MG capsule Commonly known as: NEURONTIN Take 2 caspules by mouth at bedtime  lisinopril 10 MG tablet Commonly known as: ZESTRIL Take 10 mg by mouth daily.   metFORMIN 1000 MG tablet Commonly known as: GLUCOPHAGE Take 1,000 mg by mouth 2 (two) times daily with a meal.   NEOSPORIN PLUS 1 % cream Generic drug: neomycin-polymyxin-pramoxine Apply topically 2 (two) times daily.   polyethylene glycol 17 g  packet Commonly known as: MIRALAX / GLYCOLAX Take 17 g by mouth daily. Start taking on: August 16, 2020   ProAir HFA 108 (90 Base) MCG/ACT inhaler Generic drug: albuterol Inhale 2 puffs into the lungs every 4 (four) hours as needed for wheezing or shortness of breath.   senna-docusate 8.6-50 MG tablet Commonly known as: Senokot-S Take 2 tablets by mouth at bedtime as needed for mild constipation.   Spiriva HandiHaler 18 MCG inhalation capsule Generic drug: tiotropium Place 1 capsule (18 mcg total) into inhaler and inhale daily.   traMADol 50 MG tablet Commonly known as: ULTRAM Take 50 mg by mouth 2 (two) times daily as needed.      DISCHARGE INSTRUCTIONS:   DIET:  Cardiac diet DISCHARGE CONDITION:  Stable ACTIVITY:  Activity as tolerated OXYGEN:  Home Oxygen: No.  Oxygen Delivery: room air DISCHARGE LOCATION:  home with home health PT/OT and palliative care to follow  If you experience worsening of your admission symptoms, develop shortness of breath, life threatening emergency, suicidal or homicidal thoughts you must seek medical attention immediately by calling 911 or calling your MD immediately  if symptoms less severe.  You Must read complete instructions/literature along with all the possible adverse reactions/side effects for all the Medicines you take and that have been prescribed to you. Take any new Medicines after you have completely understood and accpet all the possible adverse reactions/side effects.   Please note  You were cared for by a hospitalist during your hospital stay. If you have any questions about your discharge medications or the care you received while you were in the hospital after you are discharged, you can call the unit and asked to speak with the hospitalist on call if the hospitalist that took care of you is not available. Once you are discharged, your primary care physician will handle any further medical issues. Please note that NO  REFILLS for any discharge medications will be authorized once you are discharged, as it is imperative that you return to your primary care physician (or establish a relationship with a primary care physician if you do not have one) for your aftercare needs so that they can reassess your need for medications and monitor your lab values.    On the day of Discharge:  VITAL SIGNS:  Blood pressure 132/62, pulse 65, temperature 97.9 F (36.6 C), temperature source Oral, resp. rate 16, height 5\' 6"  (1.676 m), weight 117 kg, SpO2 100 %. PHYSICAL EXAMINATION:  GENERAL:  79 y.o.-year-old patient lying in the bed with no acute distress.  EYES: Pupils equal, round, reactive to light and accommodation. No scleral icterus. Extraocular muscles intact.  HEENT: Head atraumatic, normocephalic. Oropharynx and nasopharynx clear.  NECK:  Supple, no jugular venous distention. No thyroid enlargement, no tenderness.  LUNGS: Normal breath sounds bilaterally, no wheezing, rales,rhonchi or crepitation. No use of accessory muscles of respiration.  CARDIOVASCULAR: S1, S2 normal. No murmurs, rubs, or gallops.  ABDOMEN: Soft, non-tender, non-distended. Bowel sounds present. No organomegaly or mass.  EXTREMITIES: No pedal edema, cyanosis, or clubbing.  NEUROLOGIC: Cranial nerves II through XII are intact. Muscle strength 5/5 in all extremities. Sensation intact.  Gait not checked.  PSYCHIATRIC: The patient is alert and oriented x 3.  SKIN: No obvious rash, lesion, or ulcer.  DATA REVIEW:   CBC Recent Labs  Lab 08/15/20 0500  WBC 6.1  HGB 9.9*  HCT 29.9*  PLT 148*    Chemistries  Recent Labs  Lab 08/10/20 1509 08/13/20 1121 08/15/20 0500  NA 144   < > 143  K 3.2*   < > 3.9  CL 117*   < > 110  CO2 19*   < > 26  GLUCOSE 66*   < > 93  BUN 23   < > 24*  CREATININE 0.87   < > 1.13*  CALCIUM 6.5*   < > 8.9  AST 16  --   --   ALT 7  --   --   ALKPHOS 50  --   --   BILITOT 0.4  --   --    < > = values in  this interval not displayed.     Outpatient follow-up  Follow-up Information    Toni Arthurs, [place]. Schedule an appointment as soon as possible for a visit in 1 week(s).   Specialty: Internal Medicine Contact information: [id number] [facility] [place] [address] [phone]                Management plans discussed with the patient, family and they are in agreement.  CODE STATUS: Full Code   TOTAL TIME TAKING CARE OF THIS PATIENT: 45 minutes.    [name] M.D on 08/15/2020 at 4:59 PM  [facility]   CC: Primary care physician; Toni Arthurs, PA   Note: This dictation was prepared with Dragon dictation along with smaller phrase technology. Any transcriptional errors that result from this process are unintentional.

## 2020-08-15 NOTE — Progress Notes (Signed)
Mobility Specialist - Progress Note   08/15/20 1300  Mobility  Activity Ambulated in room  Level of Assistance Modified independent, requires aide device or extra time  Assistive Device [facility] Ambulated (ft) 10 ft  Mobility Response Tolerated well  Mobility performed by Mobility specialist  $Mobility charge 1 Mobility    Pt preparing for d/c upon arrival, up sitting EOB. No c/o dizziness, fatigue, or pain. Pt ambulatory to bathroom using SPC. ModI. Overall, pt tolerating well. Pt left EOB with all needs in reach and brother present.    [name] Mobility Specialist 08/15/20, 1:35 PM

## 2020-08-15 NOTE — Progress Notes (Signed)
[facility] Liaison note:  New referral for [facility] outpatient Palliative services to follow at home received from attending physician DR. Manuella Ghazi. TOC Stephanie Bowen made aware. Thank you for this referral. Flo Shanks BSN, RN, [place] [phone]

## 2020-08-18 LAB — CULTURE, BLOOD (ROUTINE X 2)
Culture: NO GROWTH
Culture: NO GROWTH

## 2020-09-06 ENCOUNTER — Telehealth: Payer: Self-pay | Admitting: Adult Health Nurse Practitioner

## 2020-09-06 NOTE — Telephone Encounter (Signed)
Spoke with patient regarding the Palliative referral/services and she was in agreement with scheduling visit.  I have scheduled an In-person Consult for 09/12/20 @ 11:45 AM.

## 2020-09-11 ENCOUNTER — Emergency Department
Admission: EM | Admit: 2020-09-11 | Discharge: 2020-09-11 | Disposition: A | Discharge status: Home / Self Care | Payer: Medicare Other | Attending: Emergency Medicine | Admitting: Emergency Medicine

## 2020-09-11 ENCOUNTER — Other Ambulatory Visit: Payer: Self-pay

## 2020-09-11 ENCOUNTER — Encounter: Payer: Self-pay | Admitting: Emergency Medicine

## 2020-09-11 DIAGNOSIS — Z87891 Personal history of nicotine dependence: Secondary | ICD-10-CM | POA: Diagnosis not present

## 2020-09-11 DIAGNOSIS — Z853 Personal history of malignant neoplasm of breast: Secondary | ICD-10-CM | POA: Diagnosis not present

## 2020-09-11 DIAGNOSIS — Z85118 Personal history of other malignant neoplasm of bronchus and lung: Secondary | ICD-10-CM | POA: Diagnosis not present

## 2020-09-11 DIAGNOSIS — I129 Hypertensive chronic kidney disease with stage 1 through stage 4 chronic kidney disease, or unspecified chronic kidney disease: Secondary | ICD-10-CM | POA: Diagnosis not present

## 2020-09-11 DIAGNOSIS — Z7984 Long term (current) use of oral hypoglycemic drugs: Secondary | ICD-10-CM | POA: Insufficient documentation

## 2020-09-11 DIAGNOSIS — E119 Type 2 diabetes mellitus without complications: Secondary | ICD-10-CM | POA: Insufficient documentation

## 2020-09-11 DIAGNOSIS — Z20822 Contact with and (suspected) exposure to covid-19: Secondary | ICD-10-CM | POA: Diagnosis not present

## 2020-09-11 DIAGNOSIS — K122 Cellulitis and abscess of mouth: Secondary | ICD-10-CM | POA: Insufficient documentation

## 2020-09-11 DIAGNOSIS — N1831 Chronic kidney disease, stage 3a: Secondary | ICD-10-CM | POA: Diagnosis not present

## 2020-09-11 DIAGNOSIS — Z79899 Other long term (current) drug therapy: Secondary | ICD-10-CM | POA: Insufficient documentation

## 2020-09-11 DIAGNOSIS — J029 Acute pharyngitis, unspecified: Secondary | ICD-10-CM | POA: Diagnosis present

## 2020-09-11 LAB — COMPREHENSIVE METABOLIC PANEL
ALT: 7 U/L (ref 0–44)
AST: 13 U/L — ABNORMAL LOW (ref 15–41)
Albumin: 2.8 g/dL — ABNORMAL LOW (ref 3.5–5.0)
Alkaline Phosphatase: 55 U/L (ref 38–126)
Anion gap: 6 (ref 5–15)
BUN: 26 mg/dL — ABNORMAL HIGH (ref 8–23)
CO2: 20 mmol/L — ABNORMAL LOW (ref 22–32)
Calcium: 7 mg/dL — ABNORMAL LOW (ref 8.9–10.3)
Chloride: 116 mmol/L — ABNORMAL HIGH (ref 98–111)
Creatinine, Ser: 1.01 mg/dL — ABNORMAL HIGH (ref 0.44–1.00)
GFR, Estimated: 57 mL/min — ABNORMAL LOW (ref >60)
Glucose, Bld: 81 mg/dL (ref 70–99)
Potassium: 3.5 mmol/L (ref 3.5–5.1)
Sodium: 142 mmol/L (ref 135–145)
Total Bilirubin: 0.5 mg/dL (ref 0.3–1.2)
Total Protein: 5.4 g/dL — ABNORMAL LOW (ref 6.5–8.1)

## 2020-09-11 LAB — CBC WITH DIFFERENTIAL/PLATELET
Abs Immature Granulocytes: 0.03 10*3/uL (ref 0.00–0.07)
Basophils Absolute: 0 10*3/uL (ref 0.0–0.1)
Basophils Relative: 1 %
Eosinophils Absolute: 0.4 10*3/uL (ref 0.0–0.5)
Eosinophils Relative: 6 %
HCT: 30.6 % — ABNORMAL LOW (ref 36.0–46.0)
Hemoglobin: 9.4 g/dL — ABNORMAL LOW (ref 12.0–15.0)
Immature Granulocytes: 0 %
Lymphocytes Relative: 26 %
Lymphs Abs: 1.8 10*3/uL (ref 0.7–4.0)
MCH: 31.1 pg (ref 26.0–34.0)
MCHC: 30.7 g/dL (ref 30.0–36.0)
MCV: 101.3 fL — ABNORMAL HIGH (ref 80.0–100.0)
Monocytes Absolute: 0.5 10*3/uL (ref 0.1–1.0)
Monocytes Relative: 7 %
Neutro Abs: 4.1 10*3/uL (ref 1.7–7.7)
Neutrophils Relative %: 60 %
Platelets: 148 10*3/uL — ABNORMAL LOW (ref 150–400)
RBC: 3.02 MIL/uL — ABNORMAL LOW (ref 3.87–5.11)
RDW: 14.6 % (ref 11.5–15.5)
WBC: 6.8 10*3/uL (ref 4.0–10.5)
nRBC: 0 % (ref 0.0–0.2)

## 2020-09-11 LAB — RESP PANEL BY RT-PCR (FLU A&B, COVID) ARPGX2
Influenza A by PCR: NEGATIVE
Influenza B by PCR: NEGATIVE
SARS Coronavirus 2 by RT PCR: NEGATIVE

## 2020-09-11 LAB — GROUP A STREP BY PCR: Group A Strep by PCR: NOT DETECTED

## 2020-09-11 MED ORDER — PREDNISONE 10 MG PO TABS: ORAL_TABLET | ORAL | 0 refills | Status: DC

## 2020-09-11 MED ORDER — LIDOCAINE VISCOUS HCL 2 % MT SOLN
15.0000 mL | Freq: Once | OROMUCOSAL | Status: AC
  Administered 2020-09-11: 15 mL via ORAL
  Filled 2020-09-11: qty 15

## 2020-09-11 MED ORDER — ALUM & MAG HYDROXIDE-SIMETH 200-200-20 MG/5ML PO SUSP
30.0000 mL | Freq: Once | ORAL | Status: AC
  Administered 2020-09-11: 30 mL via ORAL
  Filled 2020-09-11: qty 30

## 2020-09-11 MED ORDER — MAGIC MOUTHWASH: 5.0000 mL | Freq: Four times a day (QID) | ORAL | 0 refills | Status: DC

## 2020-09-11 NOTE — ED Provider Notes (Signed)
[facility] Emergency Department Provider Note  ____________________________________________   Event Date/Time   First MD Initiated Contact with Patient 09/11/20 1112     (approximate)  I have reviewed the triage vital signs and the nursing notes.   HISTORY  Chief Complaint Sore Throat   HPI Tracy Dickerson is a 79 y.o. female presents to the ED via EMS from home with complaint of sore throat for 1 month.  Patient states that this started after she had her Covid booster.  She denies any other symptoms such as fever, chills, nausea or vomiting.  She states that there has been a change in the taste of her food but no change in smell.  She denies any difficulty swallowing or talking.  She was seen prior for the same at which time she was hospitalized for acute dehydration, acute cystitis and hypertension.  She rates her pain as a 10/10.       Past Medical History:  Diagnosis Date  . Cancer ([place])    Breast and lung  . Diabetes mellitus without complication ([place])   . GERD (gastroesophageal reflux disease)   . Heart disease   . Hypertension     Patient Active Problem List   Diagnosis Date Noted  . Constipation   . UTI (urinary tract infection) 08/13/2020  . HLD (hyperlipidemia) 08/13/2020  . Acute renal failure superimposed on stage 3a chronic kidney disease ([place]) 08/13/2020  . PAF (paroxysmal atrial fibrillation) ([place]) 08/13/2020  . HTN (hypertension) 08/13/2020  . Dehydration   . Dehydration, moderate   . Acute renal failure ([place]) 04/22/2020  . Hyperkalemia   . Septic shock ([place])   . Sepsis due to urinary tract infection ([place]) 03/13/2018  . Acute kidney injury superimposed on chronic kidney disease ([place]) 02/13/2018  . Closed extra-articular fracture of distal tibia 02/02/2018  . Pneumonia 10/29/2017  . Hypotension 01/26/2017    Past Surgical History:  Procedure Laterality Date  . ABDOMINAL HYSTERECTOMY    . APPENDECTOMY    . BREAST SURGERY      breast cancer LEFT  . OPEN REDUCTION INTERNAL FIXATION (ORIF) TIBIA/FIBULA FRACTURE Left 02/03/2018   Procedure: OPEN REDUCTION INTERNAL FIXATION (ORIF) DISTAL TIBIA FRACTURE;  Surgeon: Corky Mull, MD;  Location: [facility] ORS;  Service: Orthopedics;  Laterality: Left;  ankle/lower leg  . TONSILLECTOMY      Prior to Admission medications   Medication Sig Start Date End Date Taking? Authorizing Provider  albuterol (PROAIR HFA) 108 (90 Base) MCG/ACT inhaler Inhale 2 puffs into the lungs every 4 (four) hours as needed for wheezing or shortness of breath.     [provider]  apixaban (ELIQUIS) 5 MG TABS tablet Take 5 mg by mouth 2 (two) times daily. 06/17/18   [provider]  atorvastatin (LIPITOR) 20 MG tablet Take by mouth. 05/01/20   [provider]  diphenhydramine-acetaminophen (TYLENOL PM) 25-500 MG TABS tablet Take 1-2 tablets by mouth at bedtime as needed (pain or sleep).     [provider]  gabapentin (NEURONTIN) 300 MG capsule Take 2 caspules by mouth at bedtime    [provider]  lisinopril (ZESTRIL) 10 MG tablet Take 10 mg by mouth daily. 06/28/20   [provider]  magic mouthwash SOLN Take 5 mLs by mouth 4 (four) times daily. Swish and swallow 09/11/20   [name], PA-C  metFORMIN (GLUCOPHAGE) 1000 MG tablet Take 1,000 mg by mouth 2 (two) times daily with a meal.  [provider]  neomycin-polymyxin-pramoxine (NEOSPORIN PLUS) 1 % cream Apply topically 2 (two) times daily. 05/27/20   Sable Feil, PA-C  polyethylene glycol (MIRALAX / GLYCOLAX) 17 g packet Take 17 g by mouth daily. 08/16/20   [name], MD  predniSONE (DELTASONE) 10 MG tablet Take 3 tablets once a day for the next 4 days 09/11/20   Johnn Hai, PA-C  senna-docusate (SENOKOT-S) 8.6-50 MG tablet Take 2 tablets by mouth at bedtime as needed for mild constipation. 08/15/20   [name], MD  SPIRIVA HANDIHALER 18 MCG inhalation capsule Place 1  capsule (18 mcg total) into inhaler and inhale daily. 04/23/20   [name], MD  traMADol (ULTRAM) 50 MG tablet Take 50 mg by mouth 2 (two) times daily as needed.     [provider]    Allergies Oxycodone, Percocet [oxycodone-acetaminophen], and Penicillins  Family History  Problem Relation Age of Onset  . Cancer Mother     Social History Social History   Tobacco Use  . Smoking status: Former Research scientist (life sciences)  . Smokeless tobacco: Never Used  Vaping Use  . Vaping Use: Never used  Substance Use Topics  . Alcohol use: Not Currently  . Drug use: No    Review of Systems Constitutional: No fever/chills Eyes: No visual changes. ENT: Positive for sore throat.  Negative for difficulty swallowing. Cardiovascular: Denies chest pain. Respiratory: Denies shortness of breath.  Negative for cough. Gastrointestinal: No abdominal pain.  No nausea, no vomiting.  No diarrhea.  Musculoskeletal: Negative for back pain. Skin: Negative for rash. Neurological: Negative for headaches, focal weakness or numbness. ____________________________________________   PHYSICAL EXAM:  VITAL SIGNS: ED Triage Vitals  Enc Vitals Group     BP 09/11/20 1033 (!) 99/54     Pulse Rate 09/11/20 1033 72     Resp 09/11/20 1033 18     Temp 09/11/20 1033 98.1 F (36.7 C)     Temp Source 09/11/20 1033 Oral     SpO2 09/11/20 1033 96 %     Weight 09/11/20 1034 220 lb (99.8 kg)     Height 09/11/20 1034 5\' 6"  (1.676 m)     Head Circumference --      Peak Flow --      Pain Score 09/11/20 1036 10     Pain Loc --      Pain Edu? --      Excl. in [place]? --     Constitutional: Alert and oriented. Well appearing and in no acute distress. Eyes: Conjunctivae are normal. PERRL. EOMI. Head: Atraumatic. Nose: No congestion/rhinnorhea. Mouth/Throat: Mucous membranes are moist.  Oropharynx non-erythematous.  Uvula is mildly edematous and irritated.  No exudate noted.  Patient is able to swallow and maintain secretions  without any difficulty. Neck: No stridor.   Cardiovascular: Normal rate, regular rhythm. Grossly normal heart sounds.  Good peripheral circulation. Respiratory: Normal respiratory effort.  No retractions. Lungs CTAB. Gastrointestinal: Soft and nontender. No distention.  Musculoskeletal: Patient is ambulatory without any assistance.  No edema noted lower extremities.   Neurologic:  Normal speech and language. No gross focal neurologic deficits are appreciated. No gait instability. Skin:  Skin is warm, dry and intact. No rash noted. Psychiatric: Mood and affect are normal. Speech and behavior are normal.  ____________________________________________   LABS (all labs ordered are listed, but only abnormal results are displayed)  Labs Reviewed  CBC WITH DIFFERENTIAL/PLATELET - Abnormal; Notable for the following components:      Result Value  RBC 3.02 (*)    Hemoglobin 9.4 (*)    HCT 30.6 (*)    MCV 101.3 (*)    Platelets 148 (*)    All other components within normal limits  COMPREHENSIVE METABOLIC PANEL - Abnormal; Notable for the following components:   Chloride 116 (*)    CO2 20 (*)    BUN 26 (*)    Creatinine, Ser 1.01 (*)    Calcium 7.0 (*)    Total Protein 5.4 (*)    Albumin 2.8 (*)    AST 13 (*)    GFR, Estimated 57 (*)    All other components within normal limits  RESP PANEL BY RT-PCR (FLU A&B, COVID) ARPGX2  GROUP A STREP BY PCR   ____________________________________________   PROCEDURES  Procedure(s) performed (including Critical Care):  Procedures   ____________________________________________   INITIAL IMPRESSION / ASSESSMENT AND PLAN / ED COURSE  As part of my medical decision making, I reviewed the following data within the electronic MEDICAL RECORD NUMBER Notes from prior ED visits and [place] Controlled Substance Database  79 year old female presents to the ED with continued problems of throat pain that she has continued to experience since she had her Covid  booster in November.  Patient has been able to eat and drink normally, talk without any difficulty and no difficulty breathing.  Lab work was in keeping with previous studies.  Covid/respiratory panel was negative as was her strep test.  Patient was given Maalox with viscous lidocaine while in the ED and states that it helped tremendously.  She is drinking fluids and talking on her phone with family.  Patient was discharged with a prescription for short course of prednisone along with Magic mouthwash.  She is to follow-up with Dr. Farrel Conners who is on-call for [facility] for further evaluation.  ____________________________________________   FINAL CLINICAL IMPRESSION(S) / ED DIAGNOSES  Final diagnoses:  Uvulitis     ED Discharge Orders         Ordered    predniSONE (DELTASONE) 10 MG tablet        09/11/20 1432    magic mouthwash SOLN  4 times daily       Note to Pharmacy: Equal parts diphenhydramine, Maalox and nystatin   09/11/20 1432          *Please note:  Tracy Dickerson was evaluated in Emergency Department on 09/11/2020 for the symptoms described in the history of present illness. She was evaluated in the context of the global COVID-19 pandemic, which necessitated consideration that the patient might be at risk for infection with the SARS-CoV-2 virus that causes COVID-19. Institutional protocols and algorithms that pertain to the evaluation of patients at risk for COVID-19 are in a state of rapid change based on information released by regulatory bodies including the CDC and federal and state organizations. These policies and algorithms were followed during the patient's care in the ED.  Some ED evaluations and interventions may be delayed as a result of limited staffing during and the pandemic.*   Note:  This document was prepared using Dragon voice recognition software and may include unintentional dictation errors.    Johnn Hai, PA-C 09/11/20 1456    Blake Divine,  MD 09/12/20 [phone]

## 2020-09-11 NOTE — Discharge Instructions (Signed)
Call make an appoint with Dr. Kathyrn Sheriff who is on-call for [facility] if you continue to have problems with your throat.  Continue to drink lots of fluids to stay hydrated.  2 prescriptions were written.  There is one prescription that you will have to take to the pharmacy yourself as it will not E prescribe which is the liquid medicine that you had while in the emergency department.  The prednisone is 1 time a day until finished.  This medication could cause you to have problems with sleep, increased energy or increase in appetite.

## 2020-09-11 NOTE — ED Triage Notes (Signed)
EMS Report: Pt BIB [facility] from home with c/o sore throat x 1 month since having her covid booster.   Per EMS VSS at this time.

## 2020-09-12 ENCOUNTER — Other Ambulatory Visit: Payer: Medicare Other | Admitting: Adult Health Nurse Practitioner

## 2020-09-12 DIAGNOSIS — R531 Weakness: Secondary | ICD-10-CM

## 2020-09-12 DIAGNOSIS — Z515 Encounter for palliative care: Secondary | ICD-10-CM

## 2020-09-12 DIAGNOSIS — N390 Urinary tract infection, site not specified: Secondary | ICD-10-CM

## 2020-09-12 NOTE — Progress Notes (Signed)
[facility] Consult Note Telephone: [phone]  Fax: [phone]  PATIENT NAME: Tracy Dickerson DOB: 1941/05/27 MRN: [id number]  PRIMARY CARE PROVIDER:   Toni Dickerson, [place]  REFERRING PROVIDER: Dr. Brayton Dickerson  RESPONSIBLE PARTY:   Self [phone] Tracy Dickerson, brother [phone]  Chief complaint: Initial palliative visit/weakness    RECOMMENDATIONS and PLAN:  1. Advanced care planning. Patient is full code. Patient states that she has not thought about what her medical wishes would be. Have left MOST form and information about the MOST form with patient to review. Have encouraged discussing medical wishes with her family.  2. Generalized weakness. Patient does have weakness related to her recent hospitalizations and recurrent UTIs. Continue PT as ordered.  3. Recurrent UTIs. Patient has also had issues with dehydration. Have encouraged drinking plenty of fluids. She does state that she drinks plenty of water, orange juice and tomato juice. States that she also occasionally has Pepsi Zero. Have encouraged to get most of her fluids through water.  4. Support. Patient endorses that she is able to get her medications, food, and other living expenses paid for.  Palliative will continue to monitor for symptom management/decline and make recommendations as needed. Follow-up visit in 8 weeks.  I spent 80 minutes providing this consultation. More than 50% of the time in this consultation was spent coordinating communication.   HISTORY OF PRESENT ILLNESS:  Tracy Dickerson is a 79 y.o. year old female with multiple medical problems including DMT 2, GERD, HTN, heart disease, A. fib on Eliquis, COPD, OSA, H/O breast and lung cancer. Family history includes cancer in her mother. Palliative Care was asked to help address goals of care. I have reviewed electronic medical record, latest labs and imaging. Patient lives at home with her brother.  Has another brother who lives close by in [place]. Patient is independent of high ADLs and ADLs. Does use walker for ambulation. Patient has had 10 ED/hospital admissions over the past 6 months most of them related to dehydration and UTI. Last visit to ED was on 09/11/2020 for sore throat. States that she has had the sore throat for about 3 to 4 months. Was prescribed Magic mouthwash and a few days of prednisone. She is just started this yesterday and has not noticed benefit as of yet. Has no other symptoms associated with her sore throat other than loss of taste. Denies fever, cough, Shortness of breath, sinus pain or pressure, headaches, dizziness, loss of smell, congestion, N/V/D. She does have some weakness related to her recurrent UTIs. Current weight is 217 pounds with BMI of 34.3. Rest of 10 point ROS asked and negative. She is currently receiving PT services through [facility] home health.  CODE STATUS: Full code  PPS: 70% HOSPICE ELIGIBILITY/DIAGNOSIS: TBD  PHYSICAL EXAM:  BP 130/60 HR 67 O2 98% on room air General: NAD, frail appearing Eyes: Sclera anicteric and noninjected with no discharge noted ENMT: Moist mucous membranes Cardiovascular: regular rate and rhythm Pulmonary: Lung sounds clear; normal respiratory effort Abdomen: soft, nontender, + bowel sounds Extremities: no edema, no joint deformities Skin: no rashes on exposed skin Neurological: Weakness but otherwise nonfocal   PAST MEDICAL HISTORY:  Past Medical History:  Diagnosis Date  . Cancer ([place])    Breast and lung  . Diabetes mellitus without complication ([place])   . GERD (gastroesophageal reflux disease)   . Heart disease   . Hypertension     SOCIAL HX:  Social History  Tobacco Use  . Smoking status: Former Research scientist (life sciences)  . Smokeless tobacco: Never Used  Substance Use Topics  . Alcohol use: Not Currently    ALLERGIES:  Allergies  Allergen Reactions  . Oxycodone   . Percocet [Oxycodone-Acetaminophen] Itching  .  Penicillins Rash    Has patient had a PCN reaction causing immediate rash, facial/tongue/throat swelling, SOB or lightheadedness with hypotension: Yes Has patient had a PCN reaction causing severe rash involving mucus membranes or skin necrosis: Unknown Has patient had a PCN reaction that required hospitalization: Unknown Has patient had a PCN reaction occurring within the last 10 years: No If all of the above answers are "NO", then may proceed with Cephalosporin use.     PERTINENT MEDICATIONS:  Outpatient Encounter Medications as of 09/12/2020  Medication Sig  . albuterol (PROAIR HFA) 108 (90 Base) MCG/ACT inhaler Inhale 2 puffs into the lungs every 4 (four) hours as needed for wheezing or shortness of breath.   Marland Kitchen apixaban (ELIQUIS) 5 MG TABS tablet Take 5 mg by mouth 2 (two) times daily.  Marland Kitchen atorvastatin (LIPITOR) 20 MG tablet Take by mouth.  . diphenhydramine-acetaminophen (TYLENOL PM) 25-500 MG TABS tablet Take 1-2 tablets by mouth at bedtime as needed (pain or sleep).   . gabapentin (NEURONTIN) 300 MG capsule Take 2 caspules by mouth at bedtime  . lisinopril (ZESTRIL) 10 MG tablet Take 10 mg by mouth daily.  . magic mouthwash SOLN Take 5 mLs by mouth 4 (four) times daily. Swish and swallow  . metFORMIN (GLUCOPHAGE) 1000 MG tablet Take 1,000 mg by mouth 2 (two) times daily with a meal.  . neomycin-polymyxin-pramoxine (NEOSPORIN PLUS) 1 % cream Apply topically 2 (two) times daily.  . polyethylene glycol (MIRALAX / GLYCOLAX) 17 g packet Take 17 g by mouth daily.  . predniSONE (DELTASONE) 10 MG tablet Take 3 tablets once a day for the next 4 days  . senna-docusate (SENOKOT-S) 8.6-50 MG tablet Take 2 tablets by mouth at bedtime as needed for mild constipation.  Marland Kitchen SPIRIVA HANDIHALER 18 MCG inhalation capsule Place 1 capsule (18 mcg total) into inhaler and inhale daily.  . traMADol (ULTRAM) 50 MG tablet Take 50 mg by mouth 2 (two) times daily as needed.    No facility-administered encounter  medications on file as of 09/12/2020.     Tracy Nester Jenetta Downer, NP

## 2020-10-31 ENCOUNTER — Telehealth: Payer: Self-pay | Admitting: Adult Health Nurse Practitioner

## 2020-10-31 NOTE — Telephone Encounter (Signed)
Spoke with patient to see if we could change the time of the Palliative f/u visit on 11/06/20 from 3 to 1:30 PM and she was in agreement with this.

## 2020-11-06 ENCOUNTER — Other Ambulatory Visit: Payer: Self-pay

## 2020-11-06 ENCOUNTER — Other Ambulatory Visit: Payer: Medicare Other | Admitting: Adult Health Nurse Practitioner

## 2020-11-06 DIAGNOSIS — N390 Urinary tract infection, site not specified: Secondary | ICD-10-CM

## 2020-11-06 DIAGNOSIS — R531 Weakness: Secondary | ICD-10-CM

## 2020-11-06 DIAGNOSIS — Z515 Encounter for palliative care: Secondary | ICD-10-CM

## 2020-11-06 NOTE — Progress Notes (Signed)
[name] Consult Note Telephone: [phone]  Fax: [phone]  PATIENT NAME: Tracy Dickerson DOB: 01/14/1941 MRN: [id number]  PRIMARY CARE PROVIDER:   Toni Arthurs, [place]  REFERRING PROVIDER:  Dr. Brayton Mars  RESPONSIBLE PARTY:   Self [phone] Berneice Heinrich, brother [phone]  Chief complaint: follow up palliative visit   Brother present during visit today  RECOMMENDATIONS and PLAN:  1.  Advanced care planning.  Patient is full code.  Discussed MOST form that was left at last visit. Patient wants to wait to fill out MOST form at next visit.  2. Generalized weakness.  Patient is doing much better and is no longer under PT services.  She does take walks daily.  Denies falls.  Continue daily walks.  3. Recurrent UTI.  Patient not having any signs or symptoms of UTI today.  She is trying to increase fluid intake.  Continue to encourage increasing fluids especially water intake.  Palliative will continue to monitor for symptom management/decline and make recommendations as needed.  Follow up 3 months.  Encouraged to call with any questions or concerns.  I spent 40 minutes providing this consultation, including time spent with patient/family, provider coordination, chart review including most recent labs and imaging, documentation. More than 50% of the time in this consultation was spent coordinating communication.   HISTORY OF PRESENT ILLNESS:  Tracy Dickerson is a 80 y.o. year old female with multiple medical problems including DMT 2, GERD, HTN, heart disease, A. fib on Eliquis, COPD, OSA, H/O breast and lung cancer. Palliative Care was asked to help address goals of care.  Patient's blood pressure has been running low and PCP has taken her off of triamterene/HCTZ.  Today blood pressure is running 110/58.  Patient denies dizziness, headaches, chest pain, heart palpitations.  Patient has pain related to arthritis which is  relieved with tramadol.  She was lost to follow-up to pain clinic during the pandemic.  PCP has sent in referral for her to be seen at pain clinic again.  Patient does state having some dizziness with position changes at times.  Patient denies dysuria, hematuria, fever, flank pain.  Rest of 10 point ROS asked and negative.  Patient has not had any falls, infections, hospitalizations since last visit.  Patient has been decreasing portion sizes and walking daily.  She is intentionally losing weight she is down to 212 pounds with BMI of 34.31.  CODE STATUS: full code  PPS: 70% HOSPICE ELIGIBILITY/DIAGNOSIS: TBD  PHYSICAL EXAM:  BP 110/58  HR 81  O2 98% on RA General: NAD, frail appearing Eyes: Sclera anicteric and noninjected with no discharge noted ENMT: Moist mucous membranes Cardiovascular: regular rate and rhythm Pulmonary: Lung sounds clear; normal respiratory effort Abdomen: soft, nontender, + bowel sounds Extremities: no edema, no joint deformities Skin: no rashes on exposed skin Neurological: Weakness but otherwise nonfocal  PAST MEDICAL HISTORY:  Past Medical History:  Diagnosis Date  . Cancer ([place])    Breast and lung  . Diabetes mellitus without complication ([place])   . GERD (gastroesophageal reflux disease)   . Heart disease   . Hypertension     SOCIAL HX:  Social History   Tobacco Use  . Smoking status: Former Research scientist (life sciences)  . Smokeless tobacco: Never Used  Substance Use Topics  . Alcohol use: Not Currently    ALLERGIES:  Allergies  Allergen Reactions  . Oxycodone   . Percocet [Oxycodone-Acetaminophen] Itching  . Penicillins Rash  Has patient had a PCN reaction causing immediate rash, facial/tongue/throat swelling, SOB or lightheadedness with hypotension: Yes Has patient had a PCN reaction causing severe rash involving mucus membranes or skin necrosis: Unknown Has patient had a PCN reaction that required hospitalization: Unknown Has patient had a PCN reaction  occurring within the last 10 years: No If all of the above answers are "NO", then may proceed with Cephalosporin use.     PERTINENT MEDICATIONS:  Outpatient Encounter Medications as of 11/06/2020  Medication Sig  . albuterol (PROAIR HFA) 108 (90 Base) MCG/ACT inhaler Inhale 2 puffs into the lungs every 4 (four) hours as needed for wheezing or shortness of breath.   Marland Kitchen apixaban (ELIQUIS) 5 MG TABS tablet Take 5 mg by mouth 2 (two) times daily.  Marland Kitchen atorvastatin (LIPITOR) 20 MG tablet Take by mouth.  . diphenhydramine-acetaminophen (TYLENOL PM) 25-500 MG TABS tablet Take 1-2 tablets by mouth at bedtime as needed (pain or sleep).   . gabapentin (NEURONTIN) 300 MG capsule Take 2 caspules by mouth at bedtime  . lisinopril (ZESTRIL) 10 MG tablet Take 10 mg by mouth daily.  . magic mouthwash SOLN Take 5 mLs by mouth 4 (four) times daily. Swish and swallow  . metFORMIN (GLUCOPHAGE) 1000 MG tablet Take 1,000 mg by mouth 2 (two) times daily with a meal.  . neomycin-polymyxin-pramoxine (NEOSPORIN PLUS) 1 % cream Apply topically 2 (two) times daily.  . polyethylene glycol (MIRALAX / GLYCOLAX) 17 g packet Take 17 g by mouth daily.  . predniSONE (DELTASONE) 10 MG tablet Take 3 tablets once a day for the next 4 days  . senna-docusate (SENOKOT-S) 8.6-50 MG tablet Take 2 tablets by mouth at bedtime as needed for mild constipation.  Marland Kitchen SPIRIVA HANDIHALER 18 MCG inhalation capsule Place 1 capsule (18 mcg total) into inhaler and inhale daily.  . traMADol (ULTRAM) 50 MG tablet Take 50 mg by mouth 2 (two) times daily as needed.    No facility-administered encounter medications on file as of 11/06/2020.     Keval Nam Jenetta Downer, NP

## 2021-01-26 ENCOUNTER — Telehealth: Payer: Self-pay | Admitting: Adult Health Nurse Practitioner

## 2021-01-26 NOTE — Telephone Encounter (Signed)
Called to confirm appt for 01/29/21 @ 11am.  Left VM with reason for call and call back info Ohm Dentler K. [name] NP

## 2021-01-29 ENCOUNTER — Encounter: Payer: Self-pay | Admitting: Adult Health Nurse Practitioner

## 2021-01-29 ENCOUNTER — Other Ambulatory Visit: Payer: Self-pay

## 2021-01-29 ENCOUNTER — Other Ambulatory Visit: Payer: Medicare Other | Admitting: Adult Health Nurse Practitioner

## 2021-01-29 VITALS — HR 66 | Ht 66.0 in | Wt 214.0 lb

## 2021-01-29 DIAGNOSIS — Z515 Encounter for palliative care: Secondary | ICD-10-CM

## 2021-01-29 DIAGNOSIS — R1904 Left lower quadrant abdominal swelling, mass and lump: Secondary | ICD-10-CM

## 2021-01-29 DIAGNOSIS — I480 Paroxysmal atrial fibrillation: Secondary | ICD-10-CM

## 2021-01-29 DIAGNOSIS — G8921 Chronic pain due to trauma: Secondary | ICD-10-CM

## 2021-01-29 NOTE — Progress Notes (Signed)
[facility] Consult Note Telephone: [phone]  Fax: [phone]    Date of encounter: 01/29/21 PATIENT NAME: Tracy Dickerson [address] [place] [address]   [phone] (home)  DOB: [birth year] MRN: [id number] PRIMARY CARE PROVIDER:    Toni Arthurs, PA,  [place] [address] [place] [address] [phone]  REFERRING PROVIDER:   Dr. Brayton Mars  RESPONSIBLE PARTY:    Contact Information    Name Relation Home Work [place] D Brother [phone]  [phone]   Eulis Foster Brother   [phone]       I met face to face with patient and family in home. Palliative Care was asked to follow this patient by consultation request of  Dr. Brayton Mars to address advance care planning and complex medical decision making. This is a follow up visit.  Brother present during visit today                                   ASSESSMENT AND PLAN / RECOMMENDATIONS:   Advance Care Planning/Goals of Care: Goals include to maximize quality of life and symptom management.  CODE STATUS:  Full code  Symptom Management/Plan:  Abdominal lump:  This is being followed by GI and she has upcoming appointment.  Continue follow up and recommendations by GI  A-fib: She continues on Eliquis with no side effects or unusual bleeding.  Continue Eliquis as ordered and follow up with PCP  Chronic pain: pain due to previous fracture of left lower leg.  This is well controlled with current dose of Tramadol.  Continue follow up and recommendations by pain management   Follow up Palliative Care Visit: Palliative care will continue to follow for complex medical decision making, advance care planning, and clarification of goals. Return 12 weeks or prn.  Encouraged to call with any questions or concerns  I spent 40 minutes providing this consultation. More than 50% of the time in this consultation was spent in  counseling and care coordination.  PPS: 70%  HOSPICE ELIGIBILITY/DIAGNOSIS: TBD  Chief Complaint: follow up palliative visit  HISTORY OF PRESENT ILLNESS:  Tracy Dickerson is a 80 y.o. year old female  with DMT 2, GERD, HTN, heart disease, A. fib on Eliquis, COPD, OSA, H/O breast and lung cancer. Patient has had nodule to LLQ that has been draining a thick white material but states that it has not drained anything for past couple of weeks but it is tender.  She has appointment next month to possibly consider lancing it.  She has chronic left lower leg pain due to previous fracture.  She is followed by pain management and gets good relief with Tramadol 50 mg BID PRN.  Denies headaches, dizziness, palpitations, chest pain, SOB, cough, dysuria, hematuria, N/V/D, constipation. Patient has not had any falls, infections, or hospital visits since last visit.  Rest of 10 point ROS asked and negative.  History obtained from review of EMR and interview with family and Tracy Dickerson.   PHYSICAL EXAM:   General: NAD, frail appearing Eyes: Sclera anicteric and noninjected with no discharge noted ENMT: Moist mucous membranes Cardiovascular: regular rate and rhythm; no edema Pulmonary:Lung sounds clear; normal respiratory effort Abdomen: soft, nontender, + bowel sounds; does have small nodule to LLQ that is tender with palpation and no warmth to touch or drainage noted today Extremities: moves all extremities;  ambulates without assistive devices;  no joint deformities Skin: no rasheson exposed skin Neurological: Weakness but otherwise nonfocal  Thank you for the opportunity to participate in the care of Tracy Dickerson.  The palliative care team will continue to follow. Please call our office at [phone] if we can be of additional assistance.   Jeret Goyer Jenetta Downer, NP , DNP  This chart was dictated using voice recognition software. Despite best efforts to proofread, errors can occur which can change the  documentation meaning.   COVID-19 PATIENT SCREENING TOOL Asked and negative response unless otherwise noted:   Have you had symptoms of covid, tested positive or been in contact with someone with symptoms/positive test in the past 5-10 days? negative

## 2021-02-15 ENCOUNTER — Other Ambulatory Visit: Payer: Self-pay

## 2021-02-15 ENCOUNTER — Emergency Department: Payer: Medicare Other

## 2021-02-15 ENCOUNTER — Emergency Department
Admission: EM | Admit: 2021-02-15 | Discharge: 2021-02-15 | Disposition: A | Discharge status: Home / Self Care | Payer: Medicare Other | Attending: Emergency Medicine | Admitting: Emergency Medicine

## 2021-02-15 DIAGNOSIS — Z7901 Long term (current) use of anticoagulants: Secondary | ICD-10-CM | POA: Diagnosis not present

## 2021-02-15 DIAGNOSIS — Z79899 Other long term (current) drug therapy: Secondary | ICD-10-CM | POA: Diagnosis not present

## 2021-02-15 DIAGNOSIS — Z853 Personal history of malignant neoplasm of breast: Secondary | ICD-10-CM | POA: Diagnosis not present

## 2021-02-15 DIAGNOSIS — Z87891 Personal history of nicotine dependence: Secondary | ICD-10-CM | POA: Insufficient documentation

## 2021-02-15 DIAGNOSIS — Z7984 Long term (current) use of oral hypoglycemic drugs: Secondary | ICD-10-CM | POA: Diagnosis not present

## 2021-02-15 DIAGNOSIS — I10 Essential (primary) hypertension: Secondary | ICD-10-CM | POA: Insufficient documentation

## 2021-02-15 DIAGNOSIS — E119 Type 2 diabetes mellitus without complications: Secondary | ICD-10-CM | POA: Diagnosis not present

## 2021-02-15 DIAGNOSIS — Z85118 Personal history of other malignant neoplasm of bronchus and lung: Secondary | ICD-10-CM | POA: Insufficient documentation

## 2021-02-15 DIAGNOSIS — Z20822 Contact with and (suspected) exposure to covid-19: Secondary | ICD-10-CM | POA: Diagnosis not present

## 2021-02-15 DIAGNOSIS — I480 Paroxysmal atrial fibrillation: Secondary | ICD-10-CM | POA: Insufficient documentation

## 2021-02-15 DIAGNOSIS — R519 Headache, unspecified: Secondary | ICD-10-CM | POA: Insufficient documentation

## 2021-02-15 LAB — COMPREHENSIVE METABOLIC PANEL
ALT: 11 U/L (ref 0–44)
AST: 19 U/L (ref 15–41)
Albumin: 3.9 g/dL (ref 3.5–5.0)
Alkaline Phosphatase: 78 U/L (ref 38–126)
Anion gap: 9 (ref 5–15)
BUN: 21 mg/dL (ref 8–23)
CO2: 26 mmol/L (ref 22–32)
Calcium: 9.1 mg/dL (ref 8.9–10.3)
Chloride: 106 mmol/L (ref 98–111)
Creatinine, Ser: 0.79 mg/dL (ref 0.44–1.00)
GFR, Estimated: >60 mL/min (ref >60)
Glucose, Bld: 89 mg/dL (ref 70–99)
Potassium: 4 mmol/L (ref 3.5–5.1)
Sodium: 141 mmol/L (ref 135–145)
Total Bilirubin: 0.8 mg/dL (ref 0.3–1.2)
Total Protein: 7.1 g/dL (ref 6.5–8.1)

## 2021-02-15 LAB — URINALYSIS, COMPLETE (UACMP) WITH MICROSCOPIC
Bilirubin Urine: NEGATIVE
Glucose, UA: NEGATIVE mg/dL
Ketones, ur: NEGATIVE mg/dL
Nitrite: NEGATIVE
Protein, ur: NEGATIVE mg/dL
Specific Gravity, Urine: 1.013 (ref 1.005–1.030)
WBC, UA: >50 WBC/hpf — ABNORMAL HIGH (ref 0–5)
pH: 7 (ref 5.0–8.0)

## 2021-02-15 LAB — CBC WITH DIFFERENTIAL/PLATELET
Abs Immature Granulocytes: 0.02 10*3/uL (ref 0.00–0.07)
Basophils Absolute: 0 10*3/uL (ref 0.0–0.1)
Basophils Relative: 1 %
Eosinophils Absolute: 0.3 10*3/uL (ref 0.0–0.5)
Eosinophils Relative: 4 %
HCT: 36.9 % (ref 36.0–46.0)
Hemoglobin: 11.8 g/dL — ABNORMAL LOW (ref 12.0–15.0)
Immature Granulocytes: 0 %
Lymphocytes Relative: 25 %
Lymphs Abs: 2.2 10*3/uL (ref 0.7–4.0)
MCH: 29.8 pg (ref 26.0–34.0)
MCHC: 32 g/dL (ref 30.0–36.0)
MCV: 93.2 fL (ref 80.0–100.0)
Monocytes Absolute: 0.5 10*3/uL (ref 0.1–1.0)
Monocytes Relative: 6 %
Neutro Abs: 5.8 10*3/uL (ref 1.7–7.7)
Neutrophils Relative %: 64 %
Platelets: 191 10*3/uL (ref 150–400)
RBC: 3.96 MIL/uL (ref 3.87–5.11)
RDW: 13.4 % (ref 11.5–15.5)
WBC: 8.9 10*3/uL (ref 4.0–10.5)
nRBC: 0 % (ref 0.0–0.2)

## 2021-02-15 LAB — RESP PANEL BY RT-PCR (FLU A&B, COVID) ARPGX2
Influenza A by PCR: NEGATIVE
Influenza B by PCR: NEGATIVE
SARS Coronavirus 2 by RT PCR: NEGATIVE

## 2021-02-15 MED ORDER — SODIUM CHLORIDE 0.9 % IV BOLUS
500.0000 mL | Freq: Once | INTRAVENOUS | Status: AC
  Administered 2021-02-15: 500 mL via INTRAVENOUS

## 2021-02-15 MED ORDER — KETOROLAC TROMETHAMINE 30 MG/ML IJ SOLN
30.0000 mg | Freq: Once | INTRAMUSCULAR | Status: AC
  Administered 2021-02-15: 30 mg via INTRAVENOUS
  Filled 2021-02-15: qty 1

## 2021-02-15 MED ORDER — DEXAMETHASONE SODIUM PHOSPHATE 10 MG/ML IJ SOLN
10.0000 mg | Freq: Once | INTRAMUSCULAR | Status: AC
  Administered 2021-02-15: 10 mg via INTRAVENOUS
  Filled 2021-02-15: qty 1

## 2021-02-15 NOTE — ED Triage Notes (Signed)
Pt comes into the ED via [facility] from home c/o headache.  Pt neurologically intact with EMS.  Pt has h/o migraines.  Pt states the headache started at 9:30 this morning.  VSS with EMS. 150/80, CBG 116, 97.7, 80 Hr.

## 2021-02-15 NOTE — ED Notes (Signed)
See triage note  Presents with h/a since yesterday  No fever or n/v

## 2021-02-15 NOTE — ED Triage Notes (Signed)
See first nurse note, pt reports headache that started today. No relief with BC powder.  Hx of migraines Pt alert and oriented. NAD noted

## 2021-02-15 NOTE — ED Notes (Signed)
PT verbalized understanding of d/c instructions at this time. Pt assisted via wheelchair to lobby to wait for ride. Hospital phone given to pt to call for ride. First RN Lattie Haw made aware at this time.

## 2021-02-15 NOTE — ED Notes (Signed)
Assisted pt to bathroom in w/c.

## 2021-02-15 NOTE — ED Provider Notes (Signed)
[facility]EMERGENCY DEPARTMENT  ____________________________________________  Time seen: Approximately 8:04 PM  I have reviewed the triage vital signs and the nursing notes.   HISTORY  Chief Complaint Headache   Historian Patient     HPI Tracy Dickerson is a 80 y.o. female presents to the emergency department with headache that started this morning.  Patient states that she has had a headache "all day long".  Patient states that her headache is frontal in nature. .  Patient states that she has a history of migraines but that her current headache does not feel similar.  She denies weakness in the upper and lower extremities.  No chest pain, chest tightness or abdominal pain.   Past Medical History:  Diagnosis Date  . Cancer ([place])    Breast and lung  . Diabetes mellitus without complication ([place])   . GERD (gastroesophageal reflux disease)   . Heart disease   . Hypertension      Immunizations up to date:  Yes.     Past Medical History:  Diagnosis Date  . Cancer ([place])    Breast and lung  . Diabetes mellitus without complication ([place])   . GERD (gastroesophageal reflux disease)   . Heart disease   . Hypertension     Patient Active Problem List   Diagnosis Date Noted  . Constipation   . UTI (urinary tract infection) 08/13/2020  . HLD (hyperlipidemia) 08/13/2020  . Acute renal failure superimposed on stage 3a chronic kidney disease ([place]) 08/13/2020  . PAF (paroxysmal atrial fibrillation) ([place]) 08/13/2020  . HTN (hypertension) 08/13/2020  . Dehydration   . Dehydration, moderate   . Acute renal failure ([place]) 04/22/2020  . Hyperkalemia   . Septic shock ([place])   . Sepsis due to urinary tract infection ([place]) 03/13/2018  . Acute kidney injury superimposed on chronic kidney disease ([place]) 02/13/2018  . Closed extra-articular fracture of distal tibia 02/02/2018  . Pneumonia 10/29/2017  . Hypotension 01/26/2017    Past Surgical History:  Procedure Laterality Date  .  ABDOMINAL HYSTERECTOMY    . APPENDECTOMY    . BREAST SURGERY     breast cancer LEFT  . OPEN REDUCTION INTERNAL FIXATION (ORIF) TIBIA/FIBULA FRACTURE Left 02/03/2018   Procedure: OPEN REDUCTION INTERNAL FIXATION (ORIF) DISTAL TIBIA FRACTURE;  Surgeon: Corky Mull, MD;  Location: [facility] ORS;  Service: Orthopedics;  Laterality: Left;  ankle/lower leg  . TONSILLECTOMY      Prior to Admission medications   Medication Sig Start Date End Date Taking? Authorizing Provider  albuterol (PROAIR HFA) 108 (90 Base) MCG/ACT inhaler Inhale 2 puffs into the lungs every 4 (four) hours as needed for wheezing or shortness of breath.     [provider]  apixaban (ELIQUIS) 5 MG TABS tablet Take 5 mg by mouth 2 (two) times daily. 06/17/18   [provider]  atorvastatin (LIPITOR) 20 MG tablet Take by mouth. 05/01/20   [provider]  diphenhydramine-acetaminophen (TYLENOL PM) 25-500 MG TABS tablet Take 1-2 tablets by mouth at bedtime as needed (pain or sleep).     [provider]  gabapentin (NEURONTIN) 300 MG capsule Take 2 caspules by mouth at bedtime    [provider]  lisinopril (ZESTRIL) 10 MG tablet Take 10 mg by mouth daily. 06/28/20   [provider]  magic mouthwash SOLN Take 5 mLs by mouth 4 (four) times daily. Swish and swallow 09/11/20   [name], PA-C  metFORMIN (GLUCOPHAGE) 1000 MG tablet Take 1,000 mg by mouth  2 (two) times daily with a meal.    [provider]  neomycin-polymyxin-pramoxine (NEOSPORIN PLUS) 1 % cream Apply topically 2 (two) times daily. 05/27/20   Sable Feil, PA-C  polyethylene glycol (MIRALAX / GLYCOLAX) 17 g packet Take 17 g by mouth daily. 08/16/20   [name], MD  predniSONE (DELTASONE) 10 MG tablet Take 3 tablets once a day for the next 4 days 09/11/20   Johnn Hai, PA-C  senna-docusate (SENOKOT-S) 8.6-50 MG tablet Take 2 tablets by mouth at bedtime as needed for mild constipation. 08/15/20   [name], MD  SPIRIVA HANDIHALER 18 MCG inhalation capsule Place 1 capsule (18 mcg total) into inhaler and inhale daily. 04/23/20   [name], MD  traMADol (ULTRAM) 50 MG tablet Take 50 mg by mouth 2 (two) times daily as needed.     [provider]    Allergies Oxycodone, Percocet [oxycodone-acetaminophen], and Penicillins  Family History  Problem Relation Age of Onset  . Cancer Mother     Social History Social History   Tobacco Use  . Smoking status: Former Research scientist (life sciences)  . Smokeless tobacco: Never Used  Vaping Use  . Vaping Use: Never used  Substance Use Topics  . Alcohol use: Not Currently  . Drug use: No     Review of Systems  Constitutional: No fever/chills Eyes:  No discharge ENT: No upper respiratory complaints. Respiratory: no cough. No SOB/ use of accessory muscles to breath Gastrointestinal:   No nausea, no vomiting.  No diarrhea.  No constipation. Musculoskeletal: Negative for musculoskeletal pain. Neuro: Patient has headache.  Skin: Negative for rash, abrasions, lacerations, ecchymosis.    ____________________________________________   PHYSICAL EXAM:  VITAL SIGNS: ED Triage Vitals  Enc Vitals Group     BP 02/15/21 1552 137/76     Pulse Rate 02/15/21 1552 80     Resp 02/15/21 1552 18     Temp 02/15/21 1552 98.2 F (36.8 C)     Temp Source 02/15/21 1552 Oral     SpO2 02/15/21 1552 97 %     Weight 02/15/21 1552 214 lb (97.1 kg)     Height 02/15/21 1552 5\' 6"  (1.676 m)     Head Circumference --      Peak Flow --      Pain Score 02/15/21 1602 10     Pain Loc --      Pain Edu? --      Excl. in [place]? --      Constitutional: Alert and oriented. Well appearing and in no acute distress. Eyes: Conjunctivae are normal. PERRL. EOMI. Head: Atraumatic. ENT:      Nose: No congestion/rhinnorhea.      Mouth/Throat: Mucous membranes are moist.  Neck: No stridor.  No cervical spine tenderness to palpation. Cardiovascular: Normal rate, regular rhythm.  Normal S1 and S2.  Good peripheral circulation. Respiratory: Normal respiratory effort without tachypnea or retractions. Lungs CTAB. Good air entry to the bases with no decreased or absent breath sounds Gastrointestinal: Bowel sounds x 4 quadrants. Soft and nontender to palpation. No guarding or rigidity. No distention. Musculoskeletal: Full range of motion to all extremities. No obvious deformities noted Neurologic:  Normal for age. No gross focal neurologic deficits are appreciated.  Skin:  Skin is warm, dry and intact. No rash noted. Psychiatric: Mood and affect are normal for age. Speech and behavior are normal.   ____________________________________________   LABS (all labs ordered are listed, but only abnormal results are displayed)  Labs Reviewed  CBC WITH DIFFERENTIAL/PLATELET - Abnormal; Notable for the following components:      Result Value   Hemoglobin 11.8 (*)    All other components within normal limits  URINALYSIS, COMPLETE (UACMP) WITH MICROSCOPIC - Abnormal; Notable for the following components:   Color, Urine YELLOW (*)    APPearance HAZY (*)    Hgb urine dipstick SMALL (*)    Leukocytes,Ua LARGE (*)    WBC, UA >50 (*)    Bacteria, UA MANY (*)    All other components within normal limits  RESP PANEL BY RT-PCR (FLU A&B, COVID) ARPGX2  COMPREHENSIVE METABOLIC PANEL   ____________________________________________  EKG   ____________________________________________  RADIOLOGY Unk Pinto, personally viewed and evaluated these images (plain radiographs) as part of my medical decision making, as well as reviewing the written report by the radiologist.    CT Head Wo Contrast  Result Date: 02/15/2021 CLINICAL DATA:  Headache. EXAM: CT HEAD WITHOUT CONTRAST TECHNIQUE: Contiguous axial images were obtained from the base of the skull through the vertex without intravenous contrast. COMPARISON:  05/30/2020 FINDINGS: Brain: There is no evidence for acute  hemorrhage, hydrocephalus, mass lesion, or abnormal extra-axial fluid collection. No definite CT evidence for acute infarction. Diffuse loss of parenchymal volume is consistent with atrophy. Patchy low attenuation in the deep hemispheric and periventricular white matter is nonspecific, but likely reflects chronic microvascular ischemic demyelination. Vascular: No hyperdense vessel or unexpected calcification. Skull: Normal. Negative for fracture or focal lesion. Sinuses/Orbits: The visualized paranasal sinuses and mastoid air cells are clear. Visualized portions of the globes and intraorbital fat are unremarkable. Other: None. IMPRESSION: 1. No acute intracranial abnormality. 2. Atrophy with chronic small vessel white matter ischemic disease. Electronically Signed   By: Misty Stanley M.D.   On: 02/15/2021 20:30    ____________________________________________    PROCEDURES  Procedure(s) performed:     Procedures     Medications  sodium chloride 0.9 % bolus 500 mL (0 mLs Intravenous Stopped 02/15/21 2232)  dexamethasone (DECADRON) injection 10 mg (10 mg Intravenous Given 02/15/21 2111)  ketorolac (TORADOL) 30 MG/ML injection 30 mg (30 mg Intravenous Given 02/15/21 2109)     ____________________________________________   INITIAL IMPRESSION / ASSESSMENT AND PLAN / ED COURSE  Pertinent labs & imaging results that were available during my care of the patient were reviewed by me and considered in my medical decision making (see chart for details).      Assessment and Plan:  80 year old female presents to the emergency department with frontal headache with frontal headache.   Vital signs were reassuring at triage and patient had no neurodeficits.  CBC and CMP were reassuring.  Urinalysis revealed a large amount of leukocytes and many bacteria which is consistent with patient's baseline urinalysis.  She denies dysuria, hematuria, increased urinary frequency or fever and chills at  home.  CT head shows no intracranial bleed.  COVID-19 and influenza testing were negative.  Patient was given Toradol, Decadron and fluids and she reported that her headache completely resolved and felt ready to go home.  All patient questions were answered.  ____________________________________________  FINAL CLINICAL IMPRESSION(S) / ED DIAGNOSES  Final diagnoses:  Acute nonintractable headache, unspecified headache type      NEW MEDICATIONS STARTED DURING THIS VISIT:  ED Discharge Orders    None          This chart was dictated using voice recognition software/Dragon. Despite best efforts to proofread, errors can occur which can change  the meaning. Any change was purely unintentional.     Petrice, [place], PA-C 02/15/21 2243    Naaman Plummer, MD 02/18/21 [phone]

## 2021-02-16 ENCOUNTER — Emergency Department: Payer: Medicare Other

## 2021-02-16 ENCOUNTER — Emergency Department
Admission: EM | Admit: 2021-02-16 | Discharge: 2021-02-16 | Disposition: A | Discharge status: Home / Self Care | Payer: Medicare Other | Attending: Emergency Medicine | Admitting: Emergency Medicine

## 2021-02-16 ENCOUNTER — Other Ambulatory Visit: Payer: Self-pay

## 2021-02-16 DIAGNOSIS — I6521 Occlusion and stenosis of right carotid artery: Secondary | ICD-10-CM | POA: Diagnosis not present

## 2021-02-16 DIAGNOSIS — E1122 Type 2 diabetes mellitus with diabetic chronic kidney disease: Secondary | ICD-10-CM | POA: Insufficient documentation

## 2021-02-16 DIAGNOSIS — Z79899 Other long term (current) drug therapy: Secondary | ICD-10-CM | POA: Insufficient documentation

## 2021-02-16 DIAGNOSIS — I129 Hypertensive chronic kidney disease with stage 1 through stage 4 chronic kidney disease, or unspecified chronic kidney disease: Secondary | ICD-10-CM | POA: Insufficient documentation

## 2021-02-16 DIAGNOSIS — Z87891 Personal history of nicotine dependence: Secondary | ICD-10-CM | POA: Insufficient documentation

## 2021-02-16 DIAGNOSIS — N1831 Chronic kidney disease, stage 3a: Secondary | ICD-10-CM | POA: Insufficient documentation

## 2021-02-16 DIAGNOSIS — R519 Headache, unspecified: Secondary | ICD-10-CM

## 2021-02-16 DIAGNOSIS — Z853 Personal history of malignant neoplasm of breast: Secondary | ICD-10-CM | POA: Insufficient documentation

## 2021-02-16 DIAGNOSIS — Z7901 Long term (current) use of anticoagulants: Secondary | ICD-10-CM | POA: Diagnosis not present

## 2021-02-16 DIAGNOSIS — Z7984 Long term (current) use of oral hypoglycemic drugs: Secondary | ICD-10-CM | POA: Diagnosis not present

## 2021-02-16 MED ORDER — DIPHENHYDRAMINE HCL 25 MG PO CAPS
25.0000 mg | ORAL_CAPSULE | Freq: Once | ORAL | Status: AC
  Administered 2021-02-16: 25 mg via ORAL
  Filled 2021-02-16: qty 1

## 2021-02-16 MED ORDER — KETOROLAC TROMETHAMINE 30 MG/ML IJ SOLN
15.0000 mg | Freq: Once | INTRAMUSCULAR | Status: AC
  Administered 2021-02-16: 15 mg via INTRAMUSCULAR
  Filled 2021-02-16: qty 1

## 2021-02-16 MED ORDER — SUMATRIPTAN SUCCINATE 6 MG/0.5ML ~~LOC~~ SOLN
6.0000 mg | Freq: Once | SUBCUTANEOUS | Status: AC
  Administered 2021-02-16: 6 mg via SUBCUTANEOUS
  Filled 2021-02-16: qty 0.5

## 2021-02-16 MED ORDER — PROMETHAZINE HCL 25 MG/ML IJ SOLN
12.5000 mg | Freq: Four times a day (QID) | INTRAMUSCULAR | Status: DC | PRN
  Administered 2021-02-16: 12.5 mg via INTRAMUSCULAR
  Filled 2021-02-16: qty 1

## 2021-02-16 MED ORDER — BUTALBITAL-APAP-CAFFEINE 50-325-40 MG PO TABS: 1.0000 | ORAL_TABLET | Freq: Four times a day (QID) | ORAL | 0 refills | Status: AC | PRN

## 2021-02-16 NOTE — ED Notes (Signed)
Pt transported to MRI 

## 2021-02-16 NOTE — ED Notes (Signed)
Pt helped with mobile phone to call brother.

## 2021-02-16 NOTE — ED Triage Notes (Signed)
Pt in via bus from home due to continued throbbing headache. Pt denies issues with her speech, sight, or balance. Denies nausea. Pt steady walking to triage room with her walker.

## 2021-02-16 NOTE — ED Provider Notes (Signed)
[facility] Emergency Department Provider Note  ____________________________________________  Time seen: Approximately 4:29 PM  I have reviewed the triage vital signs and the nursing notes.   HISTORY  Chief Complaint Headache    HPI Tracy Dickerson is a 80 y.o. female who presents the emergency department complaining of sharp frontal headache.  Patient was seen yesterday with similar symptoms, had a reassuring work-up at that time including a negative head CT.  Patient received a migraine cocktail and had good improvement while here in the emergency department.  Patient went home, states that headache has returned in the same location and is slightly worse than before.  She currently denies any visual changes, unilateral weakness, difficulty formulating thoughts or words.  Patient has had no trauma to the head.  She denies any neck pain or stiffness, chest pain, shortness of breath, abdominal pain, nausea or vomiting.  Medical history as described below with no complaints of chronic medical problems.  Again patient was seen yesterday had reassuring labs and imaging and initially did respond to migraine cocktail.         Past Medical History:  Diagnosis Date  . Cancer ([place])    Breast and lung  . Diabetes mellitus without complication ([place])   . GERD (gastroesophageal reflux disease)   . Heart disease   . Hypertension     Patient Active Problem List   Diagnosis Date Noted  . Constipation   . UTI (urinary tract infection) 08/13/2020  . HLD (hyperlipidemia) 08/13/2020  . Acute renal failure superimposed on stage 3a chronic kidney disease ([place]) 08/13/2020  . PAF (paroxysmal atrial fibrillation) ([place]) 08/13/2020  . HTN (hypertension) 08/13/2020  . Dehydration   . Dehydration, moderate   . Acute renal failure ([place]) 04/22/2020  . Hyperkalemia   . Septic shock ([place])   . Sepsis due to urinary tract infection ([place]) 03/13/2018  . Acute kidney injury superimposed  on chronic kidney disease ([place]) 02/13/2018  . Closed extra-articular fracture of distal tibia 02/02/2018  . Pneumonia 10/29/2017  . Hypotension 01/26/2017    Past Surgical History:  Procedure Laterality Date  . ABDOMINAL HYSTERECTOMY    . APPENDECTOMY    . BREAST SURGERY     breast cancer LEFT  . OPEN REDUCTION INTERNAL FIXATION (ORIF) TIBIA/FIBULA FRACTURE Left 02/03/2018   Procedure: OPEN REDUCTION INTERNAL FIXATION (ORIF) DISTAL TIBIA FRACTURE;  Surgeon: Corky Mull, MD;  Location: [facility] ORS;  Service: Orthopedics;  Laterality: Left;  ankle/lower leg  . TONSILLECTOMY      Prior to Admission medications   Medication Sig Start Date End Date Taking? Authorizing Provider  butalbital-acetaminophen-caffeine (FIORICET) 50-325-40 MG tablet Take 1 tablet by mouth every 6 (six) hours as needed for headache. 02/16/21 02/16/22 Yes Rollande Thursby, [name], PA-C  albuterol (PROAIR HFA) 108 (90 Base) MCG/ACT inhaler Inhale 2 puffs into the lungs every 4 (four) hours as needed for wheezing or shortness of breath.     [provider]  apixaban (ELIQUIS) 5 MG TABS tablet Take 5 mg by mouth 2 (two) times daily. 06/17/18   [provider]  atorvastatin (LIPITOR) 20 MG tablet Take by mouth. 05/01/20   [provider]  diphenhydramine-acetaminophen (TYLENOL PM) 25-500 MG TABS tablet Take 1-2 tablets by mouth at bedtime as needed (pain or sleep).     [provider]  gabapentin (NEURONTIN) 300 MG capsule Take 2 caspules by mouth at bedtime    [provider]  lisinopril (ZESTRIL) 10 MG tablet Take 10  mg by mouth daily. 06/28/20   [provider]  magic mouthwash SOLN Take 5 mLs by mouth 4 (four) times daily. Swish and swallow 09/11/20   [name], PA-C  metFORMIN (GLUCOPHAGE) 1000 MG tablet Take 1,000 mg by mouth 2 (two) times daily with a meal.    [provider]  neomycin-polymyxin-pramoxine (NEOSPORIN PLUS) 1 % cream Apply topically 2 (two)  times daily. 05/27/20   Sable Feil, PA-C  polyethylene glycol (MIRALAX / GLYCOLAX) 17 g packet Take 17 g by mouth daily. 08/16/20   [name], MD  predniSONE (DELTASONE) 10 MG tablet Take 3 tablets once a day for the next 4 days 09/11/20   Johnn Hai, PA-C  senna-docusate (SENOKOT-S) 8.6-50 MG tablet Take 2 tablets by mouth at bedtime as needed for mild constipation. 08/15/20   [name], MD  SPIRIVA HANDIHALER 18 MCG inhalation capsule Place 1 capsule (18 mcg total) into inhaler and inhale daily. 04/23/20   [name], MD  traMADol (ULTRAM) 50 MG tablet Take 50 mg by mouth 2 (two) times daily as needed.     [provider]    Allergies Oxycodone, Percocet [oxycodone-acetaminophen], and Penicillins  Family History  Problem Relation Age of Onset  . Cancer Mother     Social History Social History   Tobacco Use  . Smoking status: Former Research scientist (life sciences)  . Smokeless tobacco: Never Used  Vaping Use  . Vaping Use: Never used  Substance Use Topics  . Alcohol use: Not Currently  . Drug use: No     Review of Systems  Constitutional: No fever/chills Eyes: No visual changes. No discharge ENT: No upper respiratory complaints. Cardiovascular: no chest pain. Respiratory: no cough. No SOB. Gastrointestinal: No abdominal pain.  No nausea, no vomiting.  No diarrhea.  No constipation. Genitourinary: Negative for dysuria. No hematuria Musculoskeletal: Negative for musculoskeletal pain. Skin: Negative for rash, abrasions, lacerations, ecchymosis. Neurological: Sharp frontal headache, headache has returned and slightly worsened from last night.  Migraine cocktail last night was effective for a short period of time.  Headache has returned.  Denies focal weakness or numbness.  10 System ROS otherwise negative.  ____________________________________________   PHYSICAL EXAM:  VITAL SIGNS: ED Triage Vitals  Enc Vitals Group     BP 02/16/21 1530 135/65     Pulse Rate 02/16/21  1530 84     Resp 02/16/21 1530 17     Temp 02/16/21 1530 98 F (36.7 C)     Temp Source 02/16/21 1530 Oral     SpO2 02/16/21 1530 96 %     Weight 02/16/21 1536 214 lb (97.1 kg)     Height 02/16/21 1536 5\' 6"  (1.676 m)     Head Circumference --      Peak Flow --      Pain Score 02/16/21 1535 10     Pain Loc --      Pain Edu? --      Excl. in [place]? --      Constitutional: Alert and oriented. Well appearing and in no acute distress. Eyes: Conjunctivae are normal. PERRL. EOMI. Head: Atraumatic. ENT:      Ears:       Nose: No congestion/rhinnorhea.      Mouth/Throat: Mucous membranes are moist.  Neck: No stridor.  Neck is supple full range of motion.  No tenderness to exam.  No nuchal rigidity. Hematological/Lymphatic/Immunilogical: No cervical lymphadenopathy. Cardiovascular: Normal rate, regular rhythm. Normal S1 and S2.  Good peripheral  circulation. Respiratory: Normal respiratory effort without tachypnea or retractions. Lungs CTAB. Good air entry to the bases with no decreased or absent breath sounds. Musculoskeletal: Full range of motion to all extremities. No gross deformities appreciated. Neurologic:  Normal speech and language. No gross focal neurologic deficits are appreciated.  Cranial nerves II through XII grossly intact.  Negative Romberg's and pronator drift.  Equal grip strength bilateral upper extremities. Skin:  Skin is warm, dry and intact. No rash noted. Psychiatric: Mood and affect are normal. Speech and behavior are normal. Patient exhibits appropriate insight and judgement.   ____________________________________________   LABS (all labs ordered are listed, but only abnormal results are displayed)  Labs Reviewed - No data to display ____________________________________________  EKG   ____________________________________________  RADIOLOGY I personally viewed and evaluated these images as part of my medical decision making, as well as reviewing the written  report by the radiologist.  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  Chronic white matter ischemic disease.  No evidence of acute infarct.  No evidence of other concerning lesions.  No evidence of aneurysm.  MR ANGIO HEAD WO CONTRAST  Result Date: 02/16/2021 CLINICAL DATA:  Headache, new or worsening. New headache pattern. No history of headaches, severe frontal headache. Failed conservative therapy. Additional history provided: Patient reports headache for 2 days, history of breast and lung cancer. EXAM: MRI HEAD WITHOUT CONTRAST MRA HEAD WITHOUT CONTRAST TECHNIQUE: Multiplanar, multi-echo pulse sequences of the brain and surrounding structures were acquired without intravenous contrast. Angiographic images of the Circle of Willis were acquired using MRA technique without intravenous contrast. COMPARISON: No pertinent prior exam. COMPARISON:  Prior head CT examinations 02/15/2021 and earlier. FINDINGS: MRI HEAD FINDINGS Intermittently motion degraded examination, limiting evaluation. Most notably, there is severe motion degradation of the axial T2/FLAIR sequence and moderate/severe motion degradation of the coronal T2 weighted sequence. Brain: Mild cerebral and cerebellar atrophy. Moderate multifocal T2/FLAIR hyperintensity within the cerebral white matter and pons, nonspecific but compatible with chronic to chronic small vessel ischemic disease. Small chronic lacunar in far within the right corona radiata (series 10, image 16). There is no acute infarct. No evidence of intracranial mass. No chronic intracranial blood products. No extra-axial fluid collection. No midline shift. Vascular: Expected proximal arterial flow voids. Non dominant left vertebral artery. Skull and upper cervical spine: No focal marrow lesion. Sinuses/Orbits: Visualized orbits show no acute finding. No significant paranasal sinus disease. MRA HEAD FINDINGS The  examination is significantly motion degraded, precluding accurate evaluation for intracranial stenoses. Anterior circulation: The intracranial internal carotid arteries, middle cerebral arteries and anterior cerebral arteries are patent without proximal large vessel occlusion. There is an apparent severe stenosis within the right ICA at the level of skull base entry (series 1041, image 11). However, this apparent stenosis could potentially be exaggerated by motion artifact and artifact arising from the skull base. No intracranial aneurysm is identified. Posterior circulation: The non dominant left vertebral artery is developmentally diminutive beyond the origin of the left PICA, but patent. The dominant right vertebral artery, basilar artery and bilateral posterior cerebral arteries are patent without proximal large vessel occlusion. Posterior communicating arteries are hypoplastic or absent bilaterally. No intracranial aneurysm is identified. Anatomic variants: As described. IMPRESSION: MRI brain: 1. Intermittently motion degraded examination, as described and limiting evaluation. 2. No evidence of acute intracranial abnormality. 3. Moderate chronic small vessel ischemic changes within the cerebral white matter and pons. This includes a  small chronic lacunar infarct within the right corona radiata. 4. Mild generalized parenchymal atrophy. MRA head: 1. Significantly motion degraded examination, precluding accurate evaluation for intracranial arterial stenoses. 2. No intracranial large vessel occlusion. 3. Apparent severe stenosis within the right ICA at the point of skull base entry. However, this apparent stenosis could potentially be exaggerated by motion degradation and artifact arising from the skull base. 4. No intracranial aneurysm is identified. Electronically Signed   By: Kellie Simmering DO   On: 02/16/2021 18:49   MR BRAIN WO CONTRAST  Result Date: 02/16/2021 CLINICAL DATA:  Headache, new or worsening. New  headache pattern. No history of headaches, severe frontal headache. Failed conservative therapy. Additional history provided: Patient reports headache for 2 days, history of breast and lung cancer. EXAM: MRI HEAD WITHOUT CONTRAST MRA HEAD WITHOUT CONTRAST TECHNIQUE: Multiplanar, multi-echo pulse sequences of the brain and surrounding structures were acquired without intravenous contrast. Angiographic images of the Circle of Willis were acquired using MRA technique without intravenous contrast. COMPARISON: No pertinent prior exam. COMPARISON:  Prior head CT examinations 02/15/2021 and earlier. FINDINGS: MRI HEAD FINDINGS Intermittently motion degraded examination, limiting evaluation. Most notably, there is severe motion degradation of the axial T2/FLAIR sequence and moderate/severe motion degradation of the coronal T2 weighted sequence. Brain: Mild cerebral and cerebellar atrophy. Moderate multifocal T2/FLAIR hyperintensity within the cerebral white matter and pons, nonspecific but compatible with chronic to chronic small vessel ischemic disease. Small chronic lacunar in far within the right corona radiata (series 10, image 16). There is no acute infarct. No evidence of intracranial mass. No chronic intracranial blood products. No extra-axial fluid collection. No midline shift. Vascular: Expected proximal arterial flow voids. Non dominant left vertebral artery. Skull and upper cervical spine: No focal marrow lesion. Sinuses/Orbits: Visualized orbits show no acute finding. No significant paranasal sinus disease. MRA HEAD FINDINGS The examination is significantly motion degraded, precluding accurate evaluation for intracranial stenoses. Anterior circulation: The intracranial internal carotid arteries, middle cerebral arteries and anterior cerebral arteries are patent without proximal large vessel occlusion. There is an apparent severe stenosis within the right ICA at the level of skull base entry (series 1041, image  11). However, this apparent stenosis could potentially be exaggerated by motion artifact and artifact arising from the skull base. No intracranial aneurysm is identified. Posterior circulation: The non dominant left vertebral artery is developmentally diminutive beyond the origin of the left PICA, but patent. The dominant right vertebral artery, basilar artery and bilateral posterior cerebral arteries are patent without proximal large vessel occlusion. Posterior communicating arteries are hypoplastic or absent bilaterally. No intracranial aneurysm is identified. Anatomic variants: As described. IMPRESSION: MRI brain: 1. Intermittently motion degraded examination, as described and limiting evaluation. 2. No evidence of acute intracranial abnormality. 3. Moderate chronic small vessel ischemic changes within the cerebral white matter and pons. This includes a small chronic lacunar infarct within the right corona radiata. 4. Mild generalized parenchymal atrophy. MRA head: 1. Significantly motion degraded examination, precluding accurate evaluation for intracranial arterial stenoses. 2. No intracranial large vessel occlusion. 3. Apparent severe stenosis within the right ICA at the point of skull base entry. However, this apparent stenosis could potentially be exaggerated by motion degradation and artifact arising from the skull base. 4. No intracranial aneurysm is identified. Electronically Signed   By: Kellie Simmering DO   On: 02/16/2021 18:49    ____________________________________________    PROCEDURES  Procedure(s) performed:    Procedures    Medications  ketorolac (TORADOL) 30 MG/ML  injection 15 mg (has no administration in time range)  promethazine (PHENERGAN) injection 12.5 mg (has no administration in time range)  SUMAtriptan (IMITREX) injection 6 mg (has no administration in time range)  diphenhydrAMINE (BENADRYL) capsule 25 mg (has no administration in time range)      ____________________________________________   INITIAL IMPRESSION / ASSESSMENT AND PLAN / ED COURSE  Pertinent labs & imaging results that were available during my care of the patient were reviewed by me and considered in my medical decision making (see chart for details).  Review of the [place] CSRS was performed in accordance of the [place] prior to dispensing any controlled drugs.           Patient's diagnosis is consistent with headache.  Patient presented to emergency department complaining of ongoing frontal headache.  Patient was seen yesterday for similar complaint.  Had reassuring labs and CT scan at that time.  Patient was given migraine cocktail, had good improvement of symptoms in the emergency department was discharged.  Patient returned today after headache had returned in the same location was described as slightly worse than before.  Overall exam was reassuring with patient still remaining neurologically intact.  Given the worsened and returned symptoms, I felt that patient did require further evaluation with MRI of the brain.  There was some more right-sided ICA stenosis as well as what appeared to be a remote lacunar infarct.  No acute changes at this time.  Patient will follow-up with vascular surgery regarding her ICA stenosis, and will receive a another migraine cocktail.  This will include Imitrex, Toradol, Benadryl and Phenergan.  I will prescribe the patient some Fioricet but I have cautioned the use of this medication as it does contain a controlled substance.  Patient is understanding of this concern and aware of the risks.  For any new or concerning signs or symptoms patient may always return to the emergency department.  Otherwise follow-up with primary care and vascular surgery..  Patient is given ED precautions to return to the ED for any worsening or new symptoms.     ____________________________________________  FINAL CLINICAL IMPRESSION(S) / ED DIAGNOSES  Final  diagnoses:  Acute intractable headache, unspecified headache type  Stenosis of right internal carotid artery      NEW MEDICATIONS STARTED DURING THIS VISIT:  ED Discharge Orders         Ordered    butalbital-acetaminophen-caffeine (FIORICET) 50-325-40 MG tablet  Every 6 hours PRN        02/16/21 2030              This chart was dictated using voice recognition software/Dragon. Despite best efforts to proofread, errors can occur which can change the meaning. Any change was purely unintentional.    Darletta Moll, PA-C 02/16/21 2030    Duffy Bruce, MD 02/22/21 [phone]

## 2021-02-26 ENCOUNTER — Telehealth: Payer: Self-pay

## 2021-02-26 ENCOUNTER — Telehealth: Payer: Self-pay | Admitting: Adult Health Nurse Practitioner

## 2021-02-26 NOTE — Telephone Encounter (Signed)
Opened in error Tracy Dickerson K. [name] NP

## 2021-02-26 NOTE — Telephone Encounter (Signed)
Phone call placed to patient to check in and offer to schedule a visit with NP. Scheduled for 02/27/21 @ 10:30 am

## 2021-02-27 ENCOUNTER — Other Ambulatory Visit: Payer: Self-pay

## 2021-02-27 ENCOUNTER — Other Ambulatory Visit: Payer: Medicare Other | Admitting: Adult Health Nurse Practitioner

## 2021-02-27 ENCOUNTER — Encounter: Payer: Self-pay | Admitting: Adult Health Nurse Practitioner

## 2021-02-27 VITALS — BP 144/70 | HR 81 | Wt 214.0 lb

## 2021-02-27 DIAGNOSIS — R519 Headache, unspecified: Secondary | ICD-10-CM

## 2021-02-27 DIAGNOSIS — Z515 Encounter for palliative care: Secondary | ICD-10-CM

## 2021-02-27 DIAGNOSIS — R300 Dysuria: Secondary | ICD-10-CM

## 2021-02-27 NOTE — Progress Notes (Signed)
[facility] Palliative Care Consult Note Telephone: [phone]  Fax: [phone]    Date of encounter: 02/27/21 PATIENT NAME: Tracy Dickerson [address] [place] [address]   [phone] (home)  DOB: 08/30/1941 MRN: [id number] PRIMARY CARE PROVIDER:    Toni Arthurs, PA,  [address] [place] [address] [place] [address] [phone]  REFERRING PROVIDER:   Toni Arthurs, PA [address] [place] [address] [place],  [place] [address] [phone]  RESPONSIBLE PARTY:    Contact Information    Name Relation Home Work [place]  [place], [place] Brother [phone]       I met face to face with patient in home. Palliative Care was asked to follow this patient by consultation request of  Toni Arthurs, PA to address advance care planning and complex medical decision making. This is a follow up visit.                                   ASSESSMENT AND PLAN / RECOMMENDATIONS:   Advance Care Planning/Goals of Care: Goals include to maximize quality of life and symptom management.   CODE STATUS: full code  Symptom Management/Plan:  Frequent headaches: Patient continuing to have headaches.  States that she was told they could be possible TIAs.  She has right ICA stenosis noted on MRA and has been advised to have evaluation with vascular.  Went over discharge summary and encouraged her to set up appointment with Dr. Ronalee Belts with vein and vascular with the phone number provided.  Dysuria:  Patient having dysuria but this has been long standing and UA done during ED visit showed large leukocytes and many bacteria but this appears to be baseline for her.  Have encouraged her to set up appointment with PCP for further evaluation.  She expressed understanding.   Follow up Palliative Care Visit: Palliative care will continue to follow for complex medical decision making,  advance care planning, and clarification of goals. Return 8 weeks or prn.  I spent 60 minutes providing this consultation. More than 50% of the time in this consultation was spent in counseling and care coordination.   PPS: 70%  HOSPICE ELIGIBILITY/DIAGNOSIS: TBD  Chief Complaint: follow up palliative visit  HISTORY OF PRESENT ILLNESS:  Tracy Dickerson is a 80 y.o. year old female  with DMT 2, GERD, HTN, heart disease, A. fib on Eliquis, COPD, OSA, H/O breast and lung cancer. Patient had ED visit on 02/15/21 for headache.  Has h/o migraines but states that this was different.  CT of head did not show any acute abnormalities.  CT did show atrophy of chronic small vessel white matter ischemic lesion.  UA was baseline with large amounts of leukocytes and many bacteria. She was given migraine cocktail in ED and she felt better to go home.  She returned the next day with same headache that was worse.  MRI showed right ICA stenosis and remote lacunar infarct with no acute abnormalities.  Patient was given migraine cocktail and prescription for Fioricet.  She is still having headaches every other day and takes the Fioricet every 6 hours on the days she is having the headaches with relief.  She complains of light sensitivity and sleepiness with headaches and will sleep. She states having burning with urination and is having difficulty starting stream.  States  that this has been going for a long time (could not give a definite time frame).  Denies flank pain, falls, N/V, constipation, fever, hematuria.  Rest of 10 point ROS asked and negative except what is stated in HPI  History obtained from review of EMR and interview with [name].  I reviewed available labs, medications, imaging, studies and related documents from the EMR.  Records reviewed and summarized above.    Physical Exam:  Constitutional: NAD General: WNWD/obese  EYES: anicteric sclera, lids intact, no discharge  ENMT: intact hearing, oral  mucous membranes moist CV: S1S2, RRR, no LE edema Pulmonary: LCTA, no increased work of breathing, no cough Abdomen: normo-active BS + 4 quadrants, soft and non tender GU: No CVA tenderness MSK:  Ambulatory with cane Skin: warm and dry, no rashes on visible skin Neuro: A and O x 3 Hem/lymph/immuno: no widespread bruising   Thank you for the opportunity to participate in the care of [name].  The palliative care team will continue to follow. Please call our office at [phone] if we can be of additional assistance.   Kevonta Phariss Jenetta Downer, NP , DNP  This chart was dictated using voice recognition software. Despite best efforts to proofread, errors can occur which can change the documentation meaning.   COVID-19 PATIENT SCREENING TOOL Asked and negative response unless otherwise noted:   Have you had symptoms of covid, tested positive or been in contact with someone with symptoms/positive test in the past 5-10 days? Negative

## 2021-02-28 ENCOUNTER — Other Ambulatory Visit: Payer: Self-pay

## 2021-02-28 ENCOUNTER — Emergency Department
Admission: EM | Admit: 2021-02-28 | Discharge: 2021-03-01 | Disposition: A | Discharge status: Home / Self Care | Payer: Medicare Other | Attending: Emergency Medicine | Admitting: Emergency Medicine

## 2021-02-28 ENCOUNTER — Encounter: Payer: Self-pay | Admitting: Emergency Medicine

## 2021-02-28 ENCOUNTER — Emergency Department: Payer: Medicare Other

## 2021-02-28 DIAGNOSIS — Z7901 Long term (current) use of anticoagulants: Secondary | ICD-10-CM | POA: Insufficient documentation

## 2021-02-28 DIAGNOSIS — Z79899 Other long term (current) drug therapy: Secondary | ICD-10-CM | POA: Insufficient documentation

## 2021-02-28 DIAGNOSIS — Z853 Personal history of malignant neoplasm of breast: Secondary | ICD-10-CM | POA: Diagnosis not present

## 2021-02-28 DIAGNOSIS — Z7984 Long term (current) use of oral hypoglycemic drugs: Secondary | ICD-10-CM | POA: Diagnosis not present

## 2021-02-28 DIAGNOSIS — M79604 Pain in right leg: Secondary | ICD-10-CM

## 2021-02-28 DIAGNOSIS — M79605 Pain in left leg: Secondary | ICD-10-CM | POA: Insufficient documentation

## 2021-02-28 DIAGNOSIS — E119 Type 2 diabetes mellitus without complications: Secondary | ICD-10-CM | POA: Insufficient documentation

## 2021-02-28 DIAGNOSIS — Z87891 Personal history of nicotine dependence: Secondary | ICD-10-CM | POA: Diagnosis not present

## 2021-02-28 DIAGNOSIS — I10 Essential (primary) hypertension: Secondary | ICD-10-CM | POA: Diagnosis not present

## 2021-02-28 NOTE — ED Triage Notes (Signed)
Pt to ED via [facility] from home with c/o bilateral leg pain, per EMS pain 10/10 x 1 week since having a TIA. EMS reports ambulatory to stretcher.   128/63 90HR 16RR 96% RA

## 2021-02-28 NOTE — ED Provider Notes (Signed)
[facility] Emergency Department Provider Note   ____________________________________________   I have reviewed the triage vital signs and the nursing notes.   HISTORY  Chief Complaint Leg Pain   History limited by: Not Limited   HPI VONETTE GROSSO is a 80 y.o. female who presents to the emergency department today because of concern for bilateral leg pain. Patient states that the pain started at the time she was in the emergency department being worked up for TIAs. She says the pain extends from her knees down. She denies any recent trauma although states she has broken both ankles in the past. The patient denies any fevers, chest pain, shortness of breath.    Records reviewed. Per medical record review patient has a history of DM, GERD.   Past Medical History:  Diagnosis Date  . Cancer ([place])    Breast and lung  . Diabetes mellitus without complication ([place])   . GERD (gastroesophageal reflux disease)   . Heart disease   . Hypertension     Patient Active Problem List   Diagnosis Date Noted  . Constipation   . UTI (urinary tract infection) 08/13/2020  . HLD (hyperlipidemia) 08/13/2020  . Acute renal failure superimposed on stage 3a chronic kidney disease ([place]) 08/13/2020  . PAF (paroxysmal atrial fibrillation) ([place]) 08/13/2020  . HTN (hypertension) 08/13/2020  . Dehydration   . Dehydration, moderate   . Acute renal failure ([place]) 04/22/2020  . Hyperkalemia   . Septic shock ([place])   . Sepsis due to urinary tract infection ([place]) 03/13/2018  . Acute kidney injury superimposed on chronic kidney disease ([place]) 02/13/2018  . Closed extra-articular fracture of distal tibia 02/02/2018  . Pneumonia 10/29/2017  . Hypotension 01/26/2017    Past Surgical History:  Procedure Laterality Date  . ABDOMINAL HYSTERECTOMY    . APPENDECTOMY    . BREAST SURGERY     breast cancer LEFT  . OPEN REDUCTION INTERNAL FIXATION (ORIF) TIBIA/FIBULA FRACTURE Left 02/03/2018    Procedure: OPEN REDUCTION INTERNAL FIXATION (ORIF) DISTAL TIBIA FRACTURE;  Surgeon: Corky Mull, MD;  Location: [facility] ORS;  Service: Orthopedics;  Laterality: Left;  ankle/lower leg  . TONSILLECTOMY      Prior to Admission medications   Medication Sig Start Date End Date Taking? Authorizing Provider  albuterol (PROAIR HFA) 108 (90 Base) MCG/ACT inhaler Inhale 2 puffs into the lungs every 4 (four) hours as needed for wheezing or shortness of breath.     [provider]  apixaban (ELIQUIS) 5 MG TABS tablet Take 5 mg by mouth 2 (two) times daily. 06/17/18   [provider]  atorvastatin (LIPITOR) 20 MG tablet Take by mouth. 05/01/20   [provider]  butalbital-acetaminophen-caffeine (FIORICET) 50-325-40 MG tablet Take 1 tablet by mouth every 6 (six) hours as needed for headache. 02/16/21 02/16/22  [name], [name], PA-C  diphenhydramine-acetaminophen (TYLENOL PM) 25-500 MG TABS tablet Take 1-2 tablets by mouth at bedtime as needed (pain or sleep).     [provider]  gabapentin (NEURONTIN) 300 MG capsule Take 2 caspules by mouth at bedtime    [provider]  lisinopril (ZESTRIL) 10 MG tablet Take 10 mg by mouth daily. 06/28/20   [provider]  magic mouthwash SOLN Take 5 mLs by mouth 4 (four) times daily. Swish and swallow 09/11/20   [name], PA-C  metFORMIN (GLUCOPHAGE) 1000 MG tablet Take 1,000 mg by mouth 2 (two) times daily with a meal.    [provider]  neomycin-polymyxin-pramoxine (NEOSPORIN PLUS) 1 % cream Apply topically 2 (two) times daily. 05/27/20   Sable Feil, PA-C  polyethylene glycol (MIRALAX / GLYCOLAX) 17 g packet Take 17 g by mouth daily. 08/16/20   [name], MD  predniSONE (DELTASONE) 10 MG tablet Take 3 tablets once a day for the next 4 days 09/11/20   Johnn Hai, PA-C  senna-docusate (SENOKOT-S) 8.6-50 MG tablet Take 2 tablets by mouth at bedtime as needed for mild constipation.  08/15/20   [name], MD  SPIRIVA HANDIHALER 18 MCG inhalation capsule Place 1 capsule (18 mcg total) into inhaler and inhale daily. 04/23/20   [name], MD  traMADol (ULTRAM) 50 MG tablet Take 50 mg by mouth 2 (two) times daily as needed.     [provider]    Allergies Oxycodone, Percocet [oxycodone-acetaminophen], and Penicillins  Family History  Problem Relation Age of Onset  . Cancer Mother     Social History Social History   Tobacco Use  . Smoking status: Former Research scientist (life sciences)  . Smokeless tobacco: Never Used  Vaping Use  . Vaping Use: Never used  Substance Use Topics  . Alcohol use: Not Currently  . Drug use: No    Review of Systems Constitutional: No fever/chills Eyes: No visual changes. ENT: No sore throat. Cardiovascular: Denies chest pain. Respiratory: Denies shortness of breath. Gastrointestinal: No abdominal pain.  No nausea, no vomiting.  No diarrhea.   Genitourinary: Negative for dysuria. Musculoskeletal: Positive for leg pain.  Skin: Negative for rash. Neurological: Negative for headaches, focal weakness or numbness.  ____________________________________________   PHYSICAL EXAM:  VITAL SIGNS: ED Triage Vitals  Enc Vitals Group     BP 02/28/21 1806 131/65     Pulse Rate 02/28/21 1806 80     Resp 02/28/21 1806 18     Temp 02/28/21 1806 98.5 F (36.9 C)     Temp Source 02/28/21 1806 Oral     SpO2 02/28/21 1806 96 %     Weight 02/28/21 1807 214 lb (97.1 kg)     Height 02/28/21 1807 5\' 6"  (1.676 m)     Head Circumference --      Peak Flow --      Pain Score 02/28/21 1806 10   Constitutional: Alert and oriented.  Eyes: Conjunctivae are normal.  ENT      Head: Normocephalic and atraumatic.      Nose: No congestion/rhinnorhea.      Mouth/Throat: Mucous membranes are moist.      Neck: No stridor. Hematological/Lymphatic/Immunilogical: No cervical lymphadenopathy. Cardiovascular: Normal rate, regular rhythm.  No murmurs, rubs, or gallops.   Respiratory: Normal respiratory effort without tachypnea nor retractions. Breath sounds are clear and equal bilaterally. No wheezes/rales/rhonchi. Gastrointestinal: Soft and non tender. No rebound. No guarding.  Genitourinary: Deferred Musculoskeletal: Normal range of motion in all extremities. No lower extremity edema. Neurologic:  Normal speech and language. No gross focal neurologic deficits are appreciated.  Skin:  Skin is warm, dry and intact. No rash noted. Psychiatric: Mood and affect are normal. Speech and behavior are normal. Patient exhibits appropriate insight and judgment.  ____________________________________________    LABS (pertinent positives/negatives)  None  ____________________________________________   EKG  None  ____________________________________________    RADIOLOGY  US venous pending  ____________________________________________   PROCEDURES  Procedures  ____________________________________________   INITIAL IMPRESSION / ASSESSMENT AND PLAN / ED COURSE  Pertinent labs & imaging results that were available during my care of the patient were reviewed  by me and considered in my medical decision making (see chart for details).   Patient presents to the emergency department today because of concerns for bilateral leg pain.  On exam patient's legs without any erythema or appreciable swelling.  No deformity.  This time somewhat unclear etiology.  I would suspect musculoskeletal.  Will check ultrasound given the patient has been perhaps not quite as mobile recently as normal.  ____________________________________________   FINAL CLINICAL IMPRESSION(S) / ED DIAGNOSES  Final diagnoses:  Bilateral leg pain     Note: This dictation was prepared with Dragon dictation. Any transcriptional errors that result from this process are unintentional     Nance Pear, MD 02/28/21 2303

## 2021-02-28 NOTE — ED Triage Notes (Signed)
Pt comes into the ED via [facility] from home c/o bilateral leg pain and headache.  Pt states she is supposed to have an appt in [place] at some point to have a bone density test completed but she has no transportation.  Pt was ambulatory to EMS stretcher. Pt also c/o headache.  Pt states the pain has been ongoing since last week when she had a TIA.

## 2021-03-01 ENCOUNTER — Telehealth: Payer: Self-pay | Admitting: Adult Health Nurse Practitioner

## 2021-03-01 NOTE — ED Provider Notes (Signed)
[place] negative. Patient dc home per plan left by Dr. Archie Balboa with PCP f/u   I have personally reviewed the images performed during this visit and I agree with the Radiologist's read.   Interpretation by Radiologist:  US Venous Img Lower Bilateral  Result Date: 02/28/2021 CLINICAL DATA:  Bilateral leg pain x1 week. EXAM: BILATERAL LOWER EXTREMITY VENOUS DOPPLER ULTRASOUND TECHNIQUE: Gray-scale sonography with graded compression, as well as color Doppler and duplex ultrasound were performed to evaluate the lower extremity deep venous systems from the level of the common femoral vein and including the common femoral, femoral, profunda femoral, popliteal and calf veins including the posterior tibial, peroneal and gastrocnemius veins when visible. The superficial great saphenous vein was also interrogated. Spectral Doppler was utilized to evaluate flow at rest and with distal augmentation maneuvers in the common femoral, femoral and popliteal veins. COMPARISON:  None. FINDINGS: RIGHT LOWER EXTREMITY Common Femoral Vein: No evidence of thrombus. Normal compressibility, respiratory phasicity and response to augmentation. Saphenofemoral Junction: No evidence of thrombus. Normal compressibility and flow on color Doppler imaging. Profunda Femoral Vein: No evidence of thrombus. Normal compressibility and flow on color Doppler imaging. Femoral Vein: No evidence of thrombus. Normal compressibility, respiratory phasicity and response to augmentation. Popliteal Vein: No evidence of thrombus. Normal compressibility, respiratory phasicity and response to augmentation. Calf Veins: No evidence of thrombus. Normal compressibility and flow on color Doppler imaging. Superficial Great Saphenous Vein: No evidence of thrombus. Normal compressibility. Venous Reflux:  None. Other Findings:  None. LEFT LOWER EXTREMITY Common Femoral Vein: No evidence of thrombus. Normal compressibility, respiratory phasicity and response to augmentation.  Saphenofemoral Junction: No evidence of thrombus. Normal compressibility and flow on color Doppler imaging. Profunda Femoral Vein: No evidence of thrombus. Normal compressibility and flow on color Doppler imaging. Femoral Vein: No evidence of thrombus. Normal compressibility, respiratory phasicity and response to augmentation. Popliteal Vein: No evidence of thrombus. Normal compressibility, respiratory phasicity and response to augmentation. Calf Veins: No evidence of thrombus. Normal compressibility and flow on color Doppler imaging. Superficial Great Saphenous Vein: No evidence of thrombus. Normal compressibility. Venous Reflux:  None. Other Findings:  None. IMPRESSION: No evidence of deep venous thrombosis in either lower extremity. Electronically Signed   By: Virgina Norfolk M.D.   On: 02/28/2021 23:57      Rudene Re, MD 03/01/21 [phone]

## 2021-03-01 NOTE — ED Notes (Signed)
Pt discharged via paperwork during downtime on Epic; pt discharged to lobby and family member picked her up.

## 2021-03-01 NOTE — Telephone Encounter (Signed)
Returned patient's VM.  Left VM with reason for call and call back info Rasheen Bells K. [name] NP

## 2021-03-02 ENCOUNTER — Telehealth: Payer: Self-pay | Admitting: Adult Health Nurse Practitioner

## 2021-03-02 NOTE — Telephone Encounter (Signed)
Attempted to call patient again today to return VM left by her yesterday.  Left VM with reason for call and call back info Tiffnay Bossi K. [name] NP

## 2021-03-03 ENCOUNTER — Other Ambulatory Visit: Payer: Self-pay

## 2021-03-03 ENCOUNTER — Encounter: Payer: Self-pay | Admitting: Emergency Medicine

## 2021-03-03 ENCOUNTER — Emergency Department
Admission: EM | Admit: 2021-03-03 | Discharge: 2021-03-03 | Disposition: A | Discharge status: Home / Self Care | Payer: Medicare Other | Attending: Emergency Medicine | Admitting: Emergency Medicine

## 2021-03-03 DIAGNOSIS — Z79899 Other long term (current) drug therapy: Secondary | ICD-10-CM | POA: Diagnosis not present

## 2021-03-03 DIAGNOSIS — M79605 Pain in left leg: Secondary | ICD-10-CM | POA: Insufficient documentation

## 2021-03-03 DIAGNOSIS — I129 Hypertensive chronic kidney disease with stage 1 through stage 4 chronic kidney disease, or unspecified chronic kidney disease: Secondary | ICD-10-CM | POA: Diagnosis not present

## 2021-03-03 DIAGNOSIS — M79604 Pain in right leg: Secondary | ICD-10-CM

## 2021-03-03 DIAGNOSIS — Z853 Personal history of malignant neoplasm of breast: Secondary | ICD-10-CM | POA: Diagnosis not present

## 2021-03-03 DIAGNOSIS — Z7984 Long term (current) use of oral hypoglycemic drugs: Secondary | ICD-10-CM | POA: Diagnosis not present

## 2021-03-03 DIAGNOSIS — Z7901 Long term (current) use of anticoagulants: Secondary | ICD-10-CM | POA: Insufficient documentation

## 2021-03-03 DIAGNOSIS — E1122 Type 2 diabetes mellitus with diabetic chronic kidney disease: Secondary | ICD-10-CM | POA: Diagnosis not present

## 2021-03-03 DIAGNOSIS — N1831 Chronic kidney disease, stage 3a: Secondary | ICD-10-CM | POA: Insufficient documentation

## 2021-03-03 DIAGNOSIS — Z87891 Personal history of nicotine dependence: Secondary | ICD-10-CM | POA: Insufficient documentation

## 2021-03-03 NOTE — ED Triage Notes (Signed)
Pt via POV from home. Pt c/o bilateral leg pain. Pt was seen 2 days ago for the same. Pt states she was suppose to go to [facility] for a bone density test but unable to get transportation yesterday. Denies any other symptoms. Pt is A&OX4 and NAD.

## 2021-03-03 NOTE — Discharge Instructions (Addendum)
Your exam is normal and reassuring today. You may be experiencing some muscle pain across the shins. Consider up-dosing your Gabapentin to 2 pills at nighttime. Follow-up with your providers for ongoing pain. Return to the ED if needed.

## 2021-03-03 NOTE — ED Provider Notes (Signed)
[facility] Emergency Department Provider Note  ____________________________________________   Event Date/Time   First MD Initiated Contact with Patient 03/03/21 1509     (approximate)  I have reviewed the triage vital signs and the nursing notes.   HISTORY  Chief Complaint Leg Pain  HPI Tracy Dickerson is a 80 y.o. female presents to the ED via EMS from home.  Patient is here for subsequent visit related to bilateral lower leg pain.  Patient describes pain to the anterior shins bilaterally from the knees to the ankles.  Patient denies any preceding injury, trauma, fall.  She does give a remote history of bilateral ankle fractures with ORIF some 20+ years ago.  She denies any distal paresthesias, foot drop, leg swelling, or skin temp/color changes.  Patient was evaluated initially 3 days ago with an ultrasound that was reviewed is negative.  She was apparently scheduled to see her provider and had an outpatient bone density study scheduled for yesterday, which she missed, secondary to transportation issues.  Patient is also under the care of pain management, from home she takes daily tramadol and gabapentin.  Patient has not made contact with a pain management provider related to this acute pain.  Past Medical History:  Diagnosis Date   Cancer ([place])    Breast and lung   Diabetes mellitus without complication ([place])    GERD (gastroesophageal reflux disease)    Heart disease    Hypertension     Patient Active Problem List   Diagnosis Date Noted   Constipation    UTI (urinary tract infection) 08/13/2020   HLD (hyperlipidemia) 08/13/2020   Acute renal failure superimposed on stage 3a chronic kidney disease ([place]) 08/13/2020   PAF (paroxysmal atrial fibrillation) ([place]) 08/13/2020   HTN (hypertension) 08/13/2020   Dehydration    Dehydration, moderate    Acute renal failure ([place]) 04/22/2020   Hyperkalemia    Septic shock ([facility])    Sepsis due to urinary tract  infection ([place]) 03/13/2018   Acute kidney injury superimposed on chronic kidney disease ([place]) 02/13/2018   Closed extra-articular fracture of distal tibia 02/02/2018   Pneumonia 10/29/2017   Hypotension 01/26/2017    Past Surgical History:  Procedure Laterality Date   ABDOMINAL HYSTERECTOMY     APPENDECTOMY     BREAST SURGERY     breast cancer LEFT   OPEN REDUCTION INTERNAL FIXATION (ORIF) TIBIA/FIBULA FRACTURE Left 02/03/2018   Procedure: OPEN REDUCTION INTERNAL FIXATION (ORIF) DISTAL TIBIA FRACTURE;  Surgeon: Corky Mull, MD;  Location: [facility] ORS;  Service: Orthopedics;  Laterality: Left;  ankle/lower leg   TONSILLECTOMY      Prior to Admission medications   Medication Sig Start Date End Date Taking? Authorizing Provider  albuterol (PROAIR HFA) 108 (90 Base) MCG/ACT inhaler Inhale 2 puffs into the lungs every 4 (four) hours as needed for wheezing or shortness of breath.     [provider]  apixaban (ELIQUIS) 5 MG TABS tablet Take 5 mg by mouth 2 (two) times daily. 06/17/18   [provider]  atorvastatin (LIPITOR) 20 MG tablet Take by mouth. 05/01/20   [provider]  butalbital-acetaminophen-caffeine (FIORICET) 50-325-40 MG tablet Take 1 tablet by mouth every 6 (six) hours as needed for headache. 02/16/21 02/16/22  [name], [name], PA-C  diphenhydramine-acetaminophen (TYLENOL PM) 25-500 MG TABS tablet Take 1-2 tablets by mouth at bedtime as needed (pain or sleep).     [provider]  gabapentin (NEURONTIN) 300 MG capsule Take  2 caspules by mouth at bedtime    [provider]  lisinopril (ZESTRIL) 10 MG tablet Take 10 mg by mouth daily. 06/28/20   [provider]  magic mouthwash SOLN Take 5 mLs by mouth 4 (four) times daily. Swish and swallow 09/11/20   [name], PA-C  metFORMIN (GLUCOPHAGE) 1000 MG tablet Take 1,000 mg by mouth 2 (two) times daily with a meal.    [provider]  neomycin-polymyxin-pramoxine  (NEOSPORIN PLUS) 1 % cream Apply topically 2 (two) times daily. 05/27/20   Sable Feil, PA-C  polyethylene glycol (MIRALAX / GLYCOLAX) 17 g packet Take 17 g by mouth daily. 08/16/20   [name], MD  senna-docusate (SENOKOT-S) 8.6-50 MG tablet Take 2 tablets by mouth at bedtime as needed for mild constipation. 08/15/20   [name], MD  SPIRIVA HANDIHALER 18 MCG inhalation capsule Place 1 capsule (18 mcg total) into inhaler and inhale daily. 04/23/20   [name], MD  traMADol (ULTRAM) 50 MG tablet Take 50 mg by mouth 2 (two) times daily as needed.     [provider]    Allergies Oxycodone, Percocet [oxycodone-acetaminophen], and Penicillins  Family History  Problem Relation Age of Onset   Cancer Mother     Social History Social History   Tobacco Use   Smoking status: Former    Pack years: 0.00   Smokeless tobacco: Never  Vaping Use   Vaping Use: Never used  Substance Use Topics   Alcohol use: Not Currently   Drug use: No    Review of Systems  Constitutional: No fever/chills Eyes: No visual changes. ENT: No sore throat. Cardiovascular: Denies chest pain. Respiratory: Denies shortness of breath. Gastrointestinal: No abdominal pain.  No nausea, no vomiting.  No diarrhea.  No constipation. Genitourinary: Negative for dysuria. Musculoskeletal: Negative for back pain.  Reports bilateral leg pain as above. Skin: Negative for rash. Neurological: Negative for headaches, focal weakness or numbness. ____________________________________________   PHYSICAL EXAM:  VITAL SIGNS: ED Triage Vitals  Enc Vitals Group     BP 03/03/21 1310 110/74     Pulse Rate 03/03/21 1310 87     Resp 03/03/21 1310 20     Temp 03/03/21 1310 98.2 F (36.8 C)     Temp Source 03/03/21 1310 Oral     SpO2 03/03/21 1310 95 %     Weight 03/03/21 1311 214 lb (97.1 kg)     Height 03/03/21 1311 5\' 6"  (1.676 m)     Head Circumference --      Peak Flow --      Pain Score 03/03/21 1318 10      Pain Loc --      Pain Edu? --      Excl. in [place]? --     Constitutional: Alert and oriented. Well appearing and in no acute distress. Eyes: Conjunctivae are normal. EOMI. Head: Atraumatic. Cardiovascular: Normal rate, regular rhythm. Grossly normal heart sounds.  Good peripheral circulation. Respiratory: Normal respiratory effort.  No retractions. Lungs CTAB. Gastrointestinal: Soft and nontender. No distention. No abdominal bruits. No CVA tenderness. Musculoskeletal: No lower extremity tenderness nor edema.  No joint effusions.  Normal range of motion of the bilateral knees and ankles.  No ankle joint effusion is appreciated.  No spinal alignment without midline tenderness, spasm, deformity, or step-off.  Patient transitions from sit to stand without assistance. Neurologic: Cranial nerves II to XII grossly intact.  Normal LE DTRs bilaterally.  Normal speech and language. No  gross focal neurologic deficits are appreciated. No gait instability. Skin:  Skin is warm, dry and intact. No rash noted. Psychiatric: Mood and affect are normal. Speech and behavior are normal.  ____________________________________________   LABS (all labs ordered are listed, but only abnormal results are displayed)  Labs Reviewed - No data to display ____________________________________________  EKG  ____________________________________________  RADIOLOGY I, Melvenia Needles, personally viewed and evaluated these images (plain radiographs) as part of my medical decision making, as well as reviewing the written report by the radiologist.  ED MD interpretation:    Official radiology report(s): No results found.  ____________________________________________   PROCEDURES  Procedure(s) performed (including Critical Care):  Procedures   ____________________________________________   INITIAL IMPRESSION / ASSESSMENT AND PLAN / ED COURSE  As part of my medical decision making, I reviewed the  following data within the [facility] chart reviewed, Radiograph reviewed NAD, Notes from prior ED visits, and [place] Controlled Substance Database   DDX: DVT, myalgias, shin splints, cellulitis  Geriatric patient ED evaluation of several days of anterior lower leg pain, likely represent an MSK etiology.  No signs of any cellulitis or skin changes.  No evidence of DVT from previous study.  No recent trauma to indicate bony injury or joint derangement.  Patient's exam is otherwise stable with no acute neuromuscular deficit.  Patient is advised at this time to consider increasing her gabapentin to 2 tabs daily at bedtime.  This is what is listed as her normal dosing instruction, but the patient reports she only takes 1 tab nightly.  She is also advised to follow-up with primary provider or pain management specialist for further management of her pain.  Return precautions have again been discussed. ____________________________________________   FINAL CLINICAL IMPRESSION(S) / ED DIAGNOSES  Final diagnoses:  Bilateral leg pain     ED Discharge Orders     None        Note:  This document was prepared using Dragon voice recognition software and may include unintentional dictation errors.    Melvenia Needles, PA-C 03/03/21 1554    Carrie Mew, MD 03/03/21 [phone]

## 2021-03-03 NOTE — ED Notes (Signed)
Pt's L leg remains chronically swollen. Pt steady when ambulating with her cane.

## 2021-03-03 NOTE — ED Notes (Signed)
Pt given hospital phone to call ride as personal phone not connecting currently.

## 2021-03-03 NOTE — ED Notes (Signed)
First Nurse Note: Pt to ED via [facility] from home for bilateral leg pain. Pt ambulatory per EMS and in NAD.

## 2021-04-20 ENCOUNTER — Emergency Department
Admission: EM | Admit: 2021-04-20 | Discharge: 2021-04-20 | Disposition: A | Discharge status: Home / Self Care | Payer: Medicare Other | Attending: Physician Assistant | Admitting: Physician Assistant

## 2021-04-20 ENCOUNTER — Encounter: Payer: Self-pay | Admitting: Emergency Medicine

## 2021-04-20 ENCOUNTER — Other Ambulatory Visit: Payer: Self-pay

## 2021-04-20 ENCOUNTER — Emergency Department: Payer: Medicare Other

## 2021-04-20 DIAGNOSIS — Z5321 Procedure and treatment not carried out due to patient leaving prior to being seen by health care provider: Secondary | ICD-10-CM | POA: Diagnosis not present

## 2021-04-20 DIAGNOSIS — M79605 Pain in left leg: Secondary | ICD-10-CM | POA: Insufficient documentation

## 2021-04-20 NOTE — ED Notes (Signed)
Pt reports she is leaving RN apologized for wait time pt reports

## 2021-04-20 NOTE — ED Triage Notes (Signed)
Pt to ER with c/o left leg pain.  States pain for last week or more.  No specific changes that brought her in today.  Pt states broke leg last year.  No new injury.

## 2021-04-20 NOTE — ED Notes (Signed)
Pt staes she is leaving.

## 2021-04-20 NOTE — ED Provider Notes (Signed)
Emergency Medicine Provider Triage Evaluation Note  Tracy Dickerson , a 80 y.o. female  was evaluated in triage.  Pt complains of left leg pain. Patient is a poor historian, recalling old events and reporting being bitten by fire ants at the bus stop, which prompted her  evaluation.  She points out an area where she has an excoriation to the mid shin. She give history of a remote fracture to the left tib/fib. She denies any recent trauma or falls.   Review of Systems  Positive: Left leg pain Negative: Fever, leg swelling  Physical Exam  There were no vitals taken for this visit. Gen:   Awake, no distress  NAD Resp:  Normal effort CTA MSK:   Moves extremities without difficulty. No edema Other:    Medical Decision Making  Medically screening exam initiated at 4:27 PM.  Appropriate orders placed.  Tracy Dickerson was informed that the remainder of the evaluation will be completed by another provider, this initial triage assessment does not replace that evaluation, and the importance of remaining in the ED until their evaluation is complete.  Patient with left shin pain without recent injury.    Melvenia Needles, PA-C 04/20/21 1844    Vladimir Crofts, MD 04/20/21 [phone]

## 2021-04-21 ENCOUNTER — Encounter: Payer: Self-pay | Admitting: Emergency Medicine

## 2021-04-21 ENCOUNTER — Emergency Department
Admission: EM | Admit: 2021-04-21 | Discharge: 2021-04-21 | Disposition: A | Discharge status: Home / Self Care | Payer: Medicare Other | Attending: Emergency Medicine | Admitting: Emergency Medicine

## 2021-04-21 ENCOUNTER — Other Ambulatory Visit: Payer: Self-pay

## 2021-04-21 DIAGNOSIS — Z7901 Long term (current) use of anticoagulants: Secondary | ICD-10-CM | POA: Insufficient documentation

## 2021-04-21 DIAGNOSIS — Z85118 Personal history of other malignant neoplasm of bronchus and lung: Secondary | ICD-10-CM | POA: Diagnosis not present

## 2021-04-21 DIAGNOSIS — Z79899 Other long term (current) drug therapy: Secondary | ICD-10-CM | POA: Insufficient documentation

## 2021-04-21 DIAGNOSIS — M79662 Pain in left lower leg: Secondary | ICD-10-CM | POA: Insufficient documentation

## 2021-04-21 DIAGNOSIS — Z7984 Long term (current) use of oral hypoglycemic drugs: Secondary | ICD-10-CM | POA: Insufficient documentation

## 2021-04-21 DIAGNOSIS — E1122 Type 2 diabetes mellitus with diabetic chronic kidney disease: Secondary | ICD-10-CM | POA: Diagnosis not present

## 2021-04-21 DIAGNOSIS — E785 Hyperlipidemia, unspecified: Secondary | ICD-10-CM | POA: Diagnosis not present

## 2021-04-21 DIAGNOSIS — Z87891 Personal history of nicotine dependence: Secondary | ICD-10-CM | POA: Diagnosis not present

## 2021-04-21 DIAGNOSIS — G8929 Other chronic pain: Secondary | ICD-10-CM

## 2021-04-21 DIAGNOSIS — M79605 Pain in left leg: Secondary | ICD-10-CM

## 2021-04-21 DIAGNOSIS — I129 Hypertensive chronic kidney disease with stage 1 through stage 4 chronic kidney disease, or unspecified chronic kidney disease: Secondary | ICD-10-CM | POA: Diagnosis not present

## 2021-04-21 DIAGNOSIS — Z853 Personal history of malignant neoplasm of breast: Secondary | ICD-10-CM | POA: Insufficient documentation

## 2021-04-21 DIAGNOSIS — E1169 Type 2 diabetes mellitus with other specified complication: Secondary | ICD-10-CM | POA: Diagnosis not present

## 2021-04-21 DIAGNOSIS — N183 Chronic kidney disease, stage 3 unspecified: Secondary | ICD-10-CM | POA: Diagnosis not present

## 2021-04-21 LAB — CBC WITH DIFFERENTIAL/PLATELET
Abs Immature Granulocytes: 0.03 10*3/uL (ref 0.00–0.07)
Basophils Absolute: 0 10*3/uL (ref 0.0–0.1)
Basophils Relative: 1 %
Eosinophils Absolute: 0.3 10*3/uL (ref 0.0–0.5)
Eosinophils Relative: 4 %
HCT: 38.1 % (ref 36.0–46.0)
Hemoglobin: 12.4 g/dL (ref 12.0–15.0)
Immature Granulocytes: 0 %
Lymphocytes Relative: 20 %
Lymphs Abs: 1.7 10*3/uL (ref 0.7–4.0)
MCH: 30.2 pg (ref 26.0–34.0)
MCHC: 32.5 g/dL (ref 30.0–36.0)
MCV: 92.9 fL (ref 80.0–100.0)
Monocytes Absolute: 0.5 10*3/uL (ref 0.1–1.0)
Monocytes Relative: 6 %
Neutro Abs: 5.9 10*3/uL (ref 1.7–7.7)
Neutrophils Relative %: 69 %
Platelets: 186 10*3/uL (ref 150–400)
RBC: 4.1 MIL/uL (ref 3.87–5.11)
RDW: 14.3 % (ref 11.5–15.5)
WBC: 8.5 10*3/uL (ref 4.0–10.5)
nRBC: 0 % (ref 0.0–0.2)

## 2021-04-21 LAB — COMPREHENSIVE METABOLIC PANEL
ALT: 10 U/L (ref 0–44)
AST: 17 U/L (ref 15–41)
Albumin: 3.9 g/dL (ref 3.5–5.0)
Alkaline Phosphatase: 72 U/L (ref 38–126)
Anion gap: 8 (ref 5–15)
BUN: 22 mg/dL (ref 8–23)
CO2: 28 mmol/L (ref 22–32)
Calcium: 9.6 mg/dL (ref 8.9–10.3)
Chloride: 106 mmol/L (ref 98–111)
Creatinine, Ser: 0.88 mg/dL (ref 0.44–1.00)
GFR, Estimated: >60 mL/min (ref >60)
Glucose, Bld: 107 mg/dL — ABNORMAL HIGH (ref 70–99)
Potassium: 4.1 mmol/L (ref 3.5–5.1)
Sodium: 142 mmol/L (ref 135–145)
Total Bilirubin: 0.9 mg/dL (ref 0.3–1.2)
Total Protein: 7.3 g/dL (ref 6.5–8.1)

## 2021-04-21 LAB — MAGNESIUM: Magnesium: 2.2 mg/dL (ref 1.7–2.4)

## 2021-04-21 NOTE — ED Provider Notes (Signed)
[facility] Emergency Department Provider Note  ____________________________________________   Event Date/Time   First MD Initiated Contact with Patient 04/21/21 [phone]     (approximate)  I have reviewed the triage vital signs and the nursing notes.   HISTORY  Chief Complaint Leg Pain   HPI Tracy Dickerson is a 80 y.o. female  to the ED with complaint of left leg pain off and on for 3 years.  She states that at that time she had a fracture.  Is continued to hurt especially for the last year and a half.  Patient does not have a PCP.  She was seen at [facility] "some while ago" and had x-rays of her leg.  She denies any recent injury.  Patient complains of cramping especially at night.  She continues to ambulate and occasionally uses her walker at home.  She was in the emergency department yesterday but left after her ultrasound and did not get the results.  She rates her pain as a 10/10.         Past Medical History:  Diagnosis Date   Cancer ([place])    Breast and lung   Diabetes mellitus without complication ([place])    GERD (gastroesophageal reflux disease)    Heart disease    Hypertension     Patient Active Problem List   Diagnosis Date Noted   Constipation    UTI (urinary tract infection) 08/13/2020   HLD (hyperlipidemia) 08/13/2020   Acute renal failure superimposed on stage 3a chronic kidney disease ([place]) 08/13/2020   PAF (paroxysmal atrial fibrillation) ([place]) 08/13/2020   HTN (hypertension) 08/13/2020   Dehydration    Dehydration, moderate    Acute renal failure ([place]) 04/22/2020   Hyperkalemia    Septic shock (H[facility])    Sepsis due to urinary tract infection ([place]) 03/13/2018   Acute kidney injury superimposed on chronic kidney disease ([place]) 02/13/2018   Closed extra-articular fracture of distal tibia 02/02/2018   Pneumonia 10/29/2017   Hypotension 01/26/2017    Past Surgical History:  Procedure Laterality Date   ABDOMINAL HYSTERECTOMY      APPENDECTOMY     BREAST SURGERY     breast cancer LEFT   OPEN REDUCTION INTERNAL FIXATION (ORIF) TIBIA/FIBULA FRACTURE Left 02/03/2018   Procedure: OPEN REDUCTION INTERNAL FIXATION (ORIF) DISTAL TIBIA FRACTURE;  Surgeon: Corky Mull, MD;  Location: [facility] ORS;  Service: Orthopedics;  Laterality: Left;  ankle/lower leg   TONSILLECTOMY      Prior to Admission medications   Medication Sig Start Date End Date Taking? Authorizing Provider  albuterol (PROAIR HFA) 108 (90 Base) MCG/ACT inhaler Inhale 2 puffs into the lungs every 4 (four) hours as needed for wheezing or shortness of breath.     [provider]  apixaban (ELIQUIS) 5 MG TABS tablet Take 5 mg by mouth 2 (two) times daily. 06/17/18   [provider]  atorvastatin (LIPITOR) 20 MG tablet Take by mouth. 05/01/20   [provider]  butalbital-acetaminophen-caffeine (FIORICET) 50-325-40 MG tablet Take 1 tablet by mouth every 6 (six) hours as needed for headache. 02/16/21 02/16/22  [name], [name], PA-C  diphenhydramine-acetaminophen (TYLENOL PM) 25-500 MG TABS tablet Take 1-2 tablets by mouth at bedtime as needed (pain or sleep).     [provider]  gabapentin (NEURONTIN) 300 MG capsule Take 2 caspules by mouth at bedtime    [provider]  lisinopril (ZESTRIL) 10 MG tablet Take 10 mg by mouth daily. 06/28/20   [provider]  magic mouthwash SOLN Take 5 mLs by mouth 4 (four) times daily. Swish and swallow 09/11/20   [name], PA-C  metFORMIN (GLUCOPHAGE) 1000 MG tablet Take 1,000 mg by mouth 2 (two) times daily with a meal.    [provider]  neomycin-polymyxin-pramoxine (NEOSPORIN PLUS) 1 % cream Apply topically 2 (two) times daily. 05/27/20   Sable Feil, PA-C  polyethylene glycol (MIRALAX / GLYCOLAX) 17 g packet Take 17 g by mouth daily. 08/16/20   [name], MD  senna-docusate (SENOKOT-S) 8.6-50 MG tablet Take 2 tablets by mouth at bedtime as needed for mild  constipation. 08/15/20   [name], MD  SPIRIVA HANDIHALER 18 MCG inhalation capsule Place 1 capsule (18 mcg total) into inhaler and inhale daily. 04/23/20   [name], MD  traMADol (ULTRAM) 50 MG tablet Take 50 mg by mouth 2 (two) times daily as needed.     [provider]    Allergies Oxycodone, Percocet [oxycodone-acetaminophen], and Penicillins  Family History  Problem Relation Age of Onset   Cancer Mother     Social History Social History   Tobacco Use   Smoking status: Former   Smokeless tobacco: Never  Scientific laboratory technician Use: Never used  Substance Use Topics   Alcohol use: Not Currently   Drug use: No    Review of Systems Constitutional: No fever/chills Eyes: No visual changes. ENT: No sore throat. Cardiovascular: Denies chest pain. Respiratory: Denies shortness of breath. Gastrointestinal: No abdominal pain.  No nausea, no vomiting.  No diarrhea. Genitourinary: Negative for dysuria. Musculoskeletal: Left lower extremity pain. Skin: Negative for rash. Neurological: Negative for headaches, focal weakness or numbness. Endocrine: History of type 2 diabetes. ____________________________________________   PHYSICAL EXAM:  VITAL SIGNS: ED Triage Vitals  Enc Vitals Group     BP 04/21/21 0929 124/75     Pulse Rate 04/21/21 0929 80     Resp 04/21/21 0929 20     Temp 04/21/21 0929 98 F (36.7 C)     Temp Source 04/21/21 0929 Oral     SpO2 04/21/21 0929 96 %     Weight 04/21/21 0930 209 lb 7 oz (95 kg)     Height 04/21/21 0930 5\' 6"  (1.676 m)     Head Circumference --      Peak Flow --      Pain Score 04/21/21 0930 10     Pain Loc --      Pain Edu? --      Excl. in [place]? --     Constitutional: Alert and oriented. Well appearing and in no acute distress. Eyes: Conjunctivae are normal.  Head: Atraumatic. Neck: No stridor.   Cardiovascular: Normal rate, regular rhythm. Grossly normal heart sounds.  Good peripheral circulation. Respiratory: Normal  respiratory effort.  No retractions. Lungs CTAB. Gastrointestinal: Soft and nontender. No distention.  Musculoskeletal: Examination of the left leg there is no gross deformity in comparison with the right.  There is some minimal anterior soft tissue tenderness on palpation but no ecchymosis or abrasions are noted.  No pitting edema.  Patient is able to move upper and lower extremities without any difficulty.  Good muscle strength bilaterally at 5/5.  Both PT and DP are present.  Skin is intact.  Bevelyn Buckles' sign is grossly negative. Neurologic:  Normal speech and language. No gross focal neurologic deficits are appreciated. No gait instability. Skin:  Skin is warm, dry and intact. No rash noted. Psychiatric: Mood and  affect are normal. Speech and behavior are normal.  ____________________________________________   LABS (all labs ordered are listed, but only abnormal results are displayed)  Labs Reviewed  COMPREHENSIVE METABOLIC PANEL - Abnormal; Notable for the following components:      Result Value   Glucose, Bld 107 (*)    All other components within normal limits  CBC WITH DIFFERENTIAL/PLATELET  MAGNESIUM   ____________________________________________  RADIOLOGY Leana Gamer, personally viewed and evaluated these images (plain radiographs) as part of my medical decision making, as well as reviewing the written report by the radiologist.  Reviewed ultrasound report from 04/20/2021. ____________________________________________   PROCEDURES  Procedure(s) performed (including Critical Care):  Procedures   ____________________________________________   INITIAL IMPRESSION / ASSESSMENT AND PLAN / ED COURSE  As part of my medical decision making, I reviewed the following data within the electronic MEDICAL RECORD NUMBER Notes from prior ED visits and [place] Controlled Substance Database  80 year old female presents to the ED today with continued left leg pain without recent history of  injury.  Patient reports that she was in the emergency department yesterday at which time an ultrasound was done however she left before being seen or receiving the results of her ultrasound.  Patient reports that this has been going on for almost 3 years.  He was seen by her PCP at [facility] a year and a half ago which time her x-rays were negative.  Lab work was reassuring and patient was made aware.  She was also reassured that her ultrasound did not show a DVT yesterday.  Patient is to follow-up with her primary care provider in [place].  Patient will continue with her regular medication which also includes a tramadol that she takes twice a day.  She is to return to the emergency department if any severe worsening of her symptoms otherwise she is to follow-up with her doctor at [facility].  ____________________________________________   FINAL CLINICAL IMPRESSION(S) / ED DIAGNOSES  Final diagnoses:  Left leg pain  Chronic pain of left lower extremity     ED Discharge Orders     None        Note:  This document was prepared using Dragon voice recognition software and may include unintentional dictation errors.    Johnn Hai, PA-C 04/21/21 1529    Lavonia Drafts, MD 04/22/21 [phone]

## 2021-04-21 NOTE — ED Triage Notes (Signed)
Pt in via EMS from home with c/o leg pain for the last month. Pt was here for the same thing yesterday but left before being seen. Denies injuries, reports pain to both legs. 142/64

## 2021-04-21 NOTE — ED Triage Notes (Signed)
See first nurse note. Pt LWBS yesterday. [place] was done. Pt is A&Ox4 and NAD.

## 2021-04-21 NOTE — Discharge Instructions (Addendum)
Follow-up with your primary care provider.  Call make an appointment in his office.  Continue taking your medication as prescribed by your doctor.  Do not wear the Ace wrap more than 5 to 7 days.  After that you need to remove it.  Yesterday's ultrasound was negative for DVT and lab work today is normal.

## 2021-04-23 ENCOUNTER — Other Ambulatory Visit: Payer: Medicare Other | Admitting: Student

## 2021-04-23 ENCOUNTER — Other Ambulatory Visit: Payer: Self-pay

## 2021-04-23 DIAGNOSIS — Z515 Encounter for palliative care: Secondary | ICD-10-CM

## 2021-04-23 DIAGNOSIS — M79605 Pain in left leg: Secondary | ICD-10-CM

## 2021-04-23 NOTE — Progress Notes (Signed)
[facility] Palliative Care Consult Note Telephone: [phone]  Fax: [phone]    Date of encounter: 04/23/21 PATIENT NAME: Tracy Dickerson [place] [id number]   [phone] (home)  DOB: July 18, 1941 MRN: [id number] PRIMARY CARE PROVIDER:    Toni Arthurs, PA,  [address] [place] [address] [place] [address] [phone]  REFERRING PROVIDER:   Toni Arthurs, PA [address] [place] [address] [place],  [place] [address] [phone]  RESPONSIBLE PARTY:    Contact Information     Name Relation Home Work [place]  [place], [place] Brother [phone]        I met face to face with patient in the home. Palliative Care was asked to follow this patient by consultation request of  Toni Arthurs, PA to address advance care planning and complex medical decision making. This is a follow up visit.                                   ASSESSMENT AND PLAN / RECOMMENDATIONS:   Advance Care Planning/Goals of Care: Goals include to maximize quality of life and symptom management. CODE STATUS: Full Code  Symptom Management/Plan:  Left leg pain-US on 04/20/2021 negative for DVT. Continue tramadol BID, recommend taking acetaminophen 1000 mg BID routinely. Continue gabapentin as directed. Discussed PT due to leg pain, weakness. She is open to therapy. Will reach out to PCP for referral. Use rollator walker for ambulation.   Follow up Palliative Care Visit: Palliative care will continue to follow for complex medical decision making, advance care planning, and clarification of goals. Return in 8 weeks or prn.  I spent 40 minutes providing this consultation. More than 50% of the time in this consultation was spent in counseling and care coordination.   PPS: 70%  HOSPICE ELIGIBILITY/DIAGNOSIS: TBD  Chief Complaint: Palliative Medicine follow up visit.    HISTORY OF PRESENT ILLNESS:  DARBIE BIANCARDI is a 80 y.o. year old female  with hypertension, hyperlipidemia, atrial fibrillation-on Eliquis, COPD, OSA, T2DM, squamous cell skin cancer. Hx of leg fracture due to fall, neuropathic pain, hx of breast cancer(left breast 1990), lung cancer.   Patient resides at home. She was seen in ED on Friday due to left leg pain; she left prior to receiving [place] reports due to wait time. She returned on Sunday and was seen by provider. [place] was negative for DVT of LLE. She has ace wrap in place. She states she is taking tramadol BID and acetaminophen PRN pain. She endorses a sharp pain, 9/10 at its worst. She has Mirapex 0.25 mg ordered but has not taken yet. She denies any headaches. Appetite is good; reports losing about 20 pound loss from walking. Checks blood sugar, 133 this morning. Usually between 110-130's. Her last hemoglobin A1C was 5.6 01/01/2021. No recent falls; uses rollator walker for ambulation. She is sleeping well at night. No recent infections. F/u appointment on 8/17 at  [facility], pain management appointment 08/06/21;   History obtained from review of EMR, discussion with primary team, and interview with family, facility staff/caregiver and/or Tracy Dickerson.  I reviewed available labs, medications, imaging, studies and related documents from the EMR.  Records reviewed and summarized above.   ROS General: NAD EYES: denies vision changes ENMT: denies dysphagia Cardiovascular: denies chest pain, denies DOE  Pulmonary: denies cough, denies increased SOB Abdomen: endorses good appetite, denies constipation, endorses continence of bowel GU: denies dysuria MSK: endorse weakness Skin: denies rashes or wounds Neurological: denies pain, denies insomnia Psych: Endorses stable mood Heme/lymph/immuno: denies bruises, abnormal bleeding  Physical Exam: Weight: 210 pounds.  Pulse 68, resp 20, 120/58, sats 96% on room air Constitutional:  NAD General: frail appearing  EYES: anicteric sclera, lids intact, no discharge  ENMT: intact hearing, oral mucous membranes moist, dentition intact CV: S1S2, RRR, trace LE edema Pulmonary: LCTA except crackles to right base, no increased work of breathing, no cough, room air Abdomen: normo-active BS + 4 quadrants, soft and non tender GU: deferred MSK: moves all extremities, ambulatory; ace wrap to left leg Skin: warm and dry, no rashes or wounds on visible skin Neuro: generalized weakness, A & O x 3 Psych: non-anxious affect, pleasant Hem/lymph/immuno: no widespread bruising   Thank you for the opportunity to participate in the care of Tracy Dickerson.  The palliative care team will continue to follow. Please call our office at [phone] if we can be of additional assistance.   Ezekiel Slocumb, NP   COVID-19 PATIENT SCREENING TOOL Asked and negative response unless otherwise noted:   Have you had symptoms of covid, tested positive or been in contact with someone with symptoms/positive test in the past 5-10 days?  No

## 2021-05-24 ENCOUNTER — Emergency Department
Admission: EM | Admit: 2021-05-24 | Discharge: 2021-05-24 | Disposition: A | Discharge status: Home / Self Care | Payer: Medicare Other | Attending: Emergency Medicine | Admitting: Emergency Medicine

## 2021-05-24 ENCOUNTER — Emergency Department: Payer: Medicare Other

## 2021-05-24 ENCOUNTER — Other Ambulatory Visit: Payer: Self-pay

## 2021-05-24 DIAGNOSIS — N1831 Chronic kidney disease, stage 3a: Secondary | ICD-10-CM | POA: Insufficient documentation

## 2021-05-24 DIAGNOSIS — R1032 Left lower quadrant pain: Secondary | ICD-10-CM | POA: Diagnosis not present

## 2021-05-24 DIAGNOSIS — I129 Hypertensive chronic kidney disease with stage 1 through stage 4 chronic kidney disease, or unspecified chronic kidney disease: Secondary | ICD-10-CM | POA: Insufficient documentation

## 2021-05-24 DIAGNOSIS — E1122 Type 2 diabetes mellitus with diabetic chronic kidney disease: Secondary | ICD-10-CM | POA: Insufficient documentation

## 2021-05-24 DIAGNOSIS — R630 Anorexia: Secondary | ICD-10-CM | POA: Diagnosis not present

## 2021-05-24 DIAGNOSIS — Z79899 Other long term (current) drug therapy: Secondary | ICD-10-CM | POA: Diagnosis not present

## 2021-05-24 DIAGNOSIS — Z853 Personal history of malignant neoplasm of breast: Secondary | ICD-10-CM | POA: Insufficient documentation

## 2021-05-24 DIAGNOSIS — K219 Gastro-esophageal reflux disease without esophagitis: Secondary | ICD-10-CM | POA: Diagnosis not present

## 2021-05-24 DIAGNOSIS — Z87891 Personal history of nicotine dependence: Secondary | ICD-10-CM | POA: Diagnosis not present

## 2021-05-24 DIAGNOSIS — Z7984 Long term (current) use of oral hypoglycemic drugs: Secondary | ICD-10-CM | POA: Insufficient documentation

## 2021-05-24 DIAGNOSIS — Z7901 Long term (current) use of anticoagulants: Secondary | ICD-10-CM | POA: Insufficient documentation

## 2021-05-24 DIAGNOSIS — R197 Diarrhea, unspecified: Secondary | ICD-10-CM

## 2021-05-24 LAB — CBC
HCT: 35.7 % — ABNORMAL LOW (ref 36.0–46.0)
Hemoglobin: 12 g/dL (ref 12.0–15.0)
MCH: 30.8 pg (ref 26.0–34.0)
MCHC: 33.6 g/dL (ref 30.0–36.0)
MCV: 91.5 fL (ref 80.0–100.0)
Platelets: 170 10*3/uL (ref 150–400)
RBC: 3.9 MIL/uL (ref 3.87–5.11)
RDW: 13.4 % (ref 11.5–15.5)
WBC: 8.6 10*3/uL (ref 4.0–10.5)
nRBC: 0 % (ref 0.0–0.2)

## 2021-05-24 LAB — COMPREHENSIVE METABOLIC PANEL
ALT: 12 U/L (ref 0–44)
AST: 18 U/L (ref 15–41)
Albumin: 3.5 g/dL (ref 3.5–5.0)
Alkaline Phosphatase: 68 U/L (ref 38–126)
Anion gap: 7 (ref 5–15)
BUN: 17 mg/dL (ref 8–23)
CO2: 23 mmol/L (ref 22–32)
Calcium: 8.9 mg/dL (ref 8.9–10.3)
Chloride: 109 mmol/L (ref 98–111)
Creatinine, Ser: 0.83 mg/dL (ref 0.44–1.00)
GFR, Estimated: >60 mL/min (ref >60)
Glucose, Bld: 95 mg/dL (ref 70–99)
Potassium: 4.1 mmol/L (ref 3.5–5.1)
Sodium: 139 mmol/L (ref 135–145)
Total Bilirubin: 0.5 mg/dL (ref 0.3–1.2)
Total Protein: 6.7 g/dL (ref 6.5–8.1)

## 2021-05-24 LAB — LIPASE, BLOOD: Lipase: 27 U/L (ref 11–51)

## 2021-05-24 MED ORDER — IOHEXOL 350 MG/ML SOLN
100.0000 mL | Freq: Once | INTRAVENOUS | Status: AC | PRN
  Administered 2021-05-24: 100 mL via INTRAVENOUS
  Filled 2021-05-24: qty 100

## 2021-05-24 MED ORDER — DICYCLOMINE HCL 10 MG PO CAPS: 10.0000 mg | ORAL_CAPSULE | Freq: Four times a day (QID) | ORAL | 0 refills | Status: DC

## 2021-05-24 MED ORDER — DICYCLOMINE HCL 10 MG PO CAPS
10.0000 mg | ORAL_CAPSULE | Freq: Once | ORAL | Status: AC
  Administered 2021-05-24: 10 mg via ORAL
  Filled 2021-05-24: qty 1

## 2021-05-24 MED ORDER — LOPERAMIDE HCL 2 MG PO TABS: 2.0000 mg | ORAL_TABLET | Freq: Four times a day (QID) | ORAL | 0 refills | Status: DC | PRN

## 2021-05-24 NOTE — ED Triage Notes (Signed)
Pt comes into the ED via EMS from home with c/o abd pain with diarrhea since this morning, took pepto bismol without relief. Pt is concerned she has monkey pox. Pt is in NAD.

## 2021-05-24 NOTE — ED Provider Notes (Signed)
[facility] Emergency Department Provider Note  ____________________________________________  Time seen: Approximately 5:12 PM  I have reviewed the triage vital signs and the nursing notes.   HISTORY  Chief Complaint Abdominal Pain    HPI Tracy Dickerson is a 80 y.o. female who presents the emergency department complaining of left lower quadrant pain and diarrhea.  Patient states that she has had decreased appetite over the past couple of days, went to a market yesterday for some fresh produce and had fresh tomato and lettuce.  This morning patient woke up with left lower quadrant abdominal pain and diarrhea.  No history of diverticulitis/diverticulosis according to the patient.  She does have some history of diabetes, GERD, hypertension.  No fevers or chills are reported.  No emesis.  Patient states that her appetite is again decreased today.  No recent antibiotic use.       Past Medical History:  Diagnosis Date   Cancer ([place])    Breast and lung   Diabetes mellitus without complication ([place])    GERD (gastroesophageal reflux disease)    Heart disease    Hypertension     Patient Active Problem List   Diagnosis Date Noted   Constipation    UTI (urinary tract infection) 08/13/2020   HLD (hyperlipidemia) 08/13/2020   Acute renal failure superimposed on stage 3a chronic kidney disease ([place]) 08/13/2020   PAF (paroxysmal atrial fibrillation) ([place]) 08/13/2020   HTN (hypertension) 08/13/2020   Dehydration    Dehydration, moderate    Acute renal failure ([place]) 04/22/2020   Hyperkalemia    Septic shock (HCC)    Sepsis due to urinary tract infection ([place]) 03/13/2018   Acute kidney injury superimposed on chronic kidney disease ([place]) 02/13/2018   Closed extra-articular fracture of distal tibia 02/02/2018   Pneumonia 10/29/2017   Hypotension 01/26/2017    Past Surgical History:  Procedure Laterality Date   ABDOMINAL HYSTERECTOMY     APPENDECTOMY     BREAST  SURGERY     breast cancer LEFT   OPEN REDUCTION INTERNAL FIXATION (ORIF) TIBIA/FIBULA FRACTURE Left 02/03/2018   Procedure: OPEN REDUCTION INTERNAL FIXATION (ORIF) DISTAL TIBIA FRACTURE;  Surgeon: Corky Mull, MD;  Location: [facility] ORS;  Service: Orthopedics;  Laterality: Left;  ankle/lower leg   TONSILLECTOMY      Prior to Admission medications   Medication Sig Start Date End Date Taking? Authorizing Provider  dicyclomine (BENTYL) 10 MG capsule Take 1 capsule (10 mg total) by mouth 4 (four) times daily for 14 days. 05/24/21 06/07/21 Yes Rateel Beldin, [name], PA-C  loperamide (IMODIUM A-D) 2 MG tablet Take 1 tablet (2 mg total) by mouth 4 (four) times daily as needed for diarrhea or loose stools. 05/24/21  Yes Jemimah Cressy, [name], PA-C  acetaminophen (TYLENOL) 500 MG tablet Take 500 mg by mouth every 6 (six) hours as needed.    [provider]  albuterol (PROAIR HFA) 108 (90 Base) MCG/ACT inhaler Inhale 2 puffs into the lungs every 4 (four) hours as needed for wheezing or shortness of breath.     [provider]  apixaban (ELIQUIS) 5 MG TABS tablet Take 5 mg by mouth 2 (two) times daily. 06/17/18   [provider]  atorvastatin (LIPITOR) 20 MG tablet Take by mouth. 05/01/20   [provider]  butalbital-acetaminophen-caffeine (FIORICET) 50-325-40 MG tablet Take 1 tablet by mouth every 6 (six) hours as needed for headache. 02/16/21 02/16/22  Zamirah Denny, [name], PA-C  diphenhydramine-acetaminophen (TYLENOL PM) 25-500 MG TABS  tablet Take 1-2 tablets by mouth at bedtime as needed (pain or sleep).     [provider]  gabapentin (NEURONTIN) 300 MG capsule Take 2 caspules by mouth at bedtime    [provider]  lisinopril (ZESTRIL) 10 MG tablet Take 10 mg by mouth daily. 06/28/20   [provider]  magic mouthwash SOLN Take 5 mLs by mouth 4 (four) times daily. Swish and swallow 09/11/20   [name], PA-C  metFORMIN (GLUCOPHAGE) 1000 MG  tablet Take 1,000 mg by mouth 2 (two) times daily with a meal.    [provider]  neomycin-polymyxin-pramoxine (NEOSPORIN PLUS) 1 % cream Apply topically 2 (two) times daily. 05/27/20   Sable Feil, PA-C  polyethylene glycol (MIRALAX / GLYCOLAX) 17 g packet Take 17 g by mouth daily. 08/16/20   [name], MD  senna-docusate (SENOKOT-S) 8.6-50 MG tablet Take 2 tablets by mouth at bedtime as needed for mild constipation. 08/15/20   [name], MD  SPIRIVA HANDIHALER 18 MCG inhalation capsule Place 1 capsule (18 mcg total) into inhaler and inhale daily. 04/23/20   [name], MD  traMADol (ULTRAM) 50 MG tablet Take 50 mg by mouth 2 (two) times daily as needed.     [provider]    Allergies Oxycodone, Percocet [oxycodone-acetaminophen], and Penicillins  Family History  Problem Relation Age of Onset   Cancer Mother     Social History Social History   Tobacco Use   Smoking status: Former   Smokeless tobacco: Never  Scientific laboratory technician Use: Never used  Substance Use Topics   Alcohol use: Not Currently   Drug use: No     Review of Systems  Constitutional: No fever/chills Eyes: No visual changes. No discharge ENT: No upper respiratory complaints. Cardiovascular: no chest pain. Respiratory: no cough. No SOB. Gastrointestinal: Left lower quadrant abdominal pain.  No nausea, no vomiting.  Positive diarrhea.  No constipation. Genitourinary: Negative for dysuria. No hematuria Musculoskeletal: Negative for musculoskeletal pain. Skin: Negative for rash, abrasions, lacerations, ecchymosis. Neurological: Negative for headaches, focal weakness or numbness.  10 System ROS otherwise negative.  ____________________________________________   PHYSICAL EXAM:  VITAL SIGNS: ED Triage Vitals  Enc Vitals Group     BP 05/24/21 1345 (!) 124/108     Pulse Rate 05/24/21 1345 69     Resp 05/24/21 1345 17     Temp 05/24/21 1345 98 F (36.7 C)     Temp Source 05/24/21  1345 Oral     SpO2 05/24/21 1345 96 %     Weight 05/24/21 1702 209 lb 7 oz (95 kg)     Height 05/24/21 1702 5\' 6"  (1.676 m)     Head Circumference --      Peak Flow --      Pain Score --      Pain Loc --      Pain Edu? --      Excl. in [place]? --      Constitutional: Alert and oriented. Well appearing and in no acute distress. Eyes: Conjunctivae are normal. PERRL. EOMI. Head: Atraumatic. ENT:      Ears:       Nose: No congestion/rhinnorhea.      Mouth/Throat: Mucous membranes are moist.  Neck: No stridor.    Cardiovascular: Normal rate, regular rhythm. Normal S1 and S2.  Good peripheral circulation. Respiratory: Normal respiratory effort without tachypnea or retractions. Lungs CTAB. Good air entry to the bases with no decreased or  absent breath sounds. Gastrointestinal: No visible external abdominal wall findings.  Bowel sounds 4 quadrants.  Soft to palpation all quadrants.  Patient is tender to palpation along the left lower quadrant.  No other tenderness to palpation.. No guarding or rigidity. No palpable masses. No distention. No CVA tenderness. Musculoskeletal: Full range of motion to all extremities. No gross deformities appreciated. Neurologic:  Normal speech and language. No gross focal neurologic deficits are appreciated.  Skin:  Skin is warm, dry and intact. No rash noted. Psychiatric: Mood and affect are normal. Speech and behavior are normal. Patient exhibits appropriate insight and judgement.   ____________________________________________   LABS (all labs ordered are listed, but only abnormal results are displayed)  Labs Reviewed  CBC - Abnormal; Notable for the following components:      Result Value   HCT 35.7 (*)    All other components within normal limits  LIPASE, BLOOD  COMPREHENSIVE METABOLIC PANEL  URINALYSIS, COMPLETE (UACMP) WITH MICROSCOPIC    ____________________________________________  EKG   ____________________________________________  RADIOLOGY I personally viewed and evaluated these images as part of my medical decision making, as well as reviewing the written report by the radiologist.  ED Provider Interpretation: Nonobstructing stone right kidney.  Diverticulosis with no evidence of diverticulitis.  No evidence of colitis.  No evidence of obstruction.  CT ABDOMEN PELVIS W CONTRAST  Result Date: 05/24/2021 CLINICAL DATA:  Left lower quadrant abdominal. EXAM: CT ABDOMEN AND PELVIS WITH CONTRAST TECHNIQUE: Multidetector CT imaging of the abdomen and pelvis was performed using the standard protocol following bolus administration of intravenous contrast. CONTRAST:  177mL OMNIPAQUE IOHEXOL 350 MG/ML SOLN COMPARISON:  None. FINDINGS: Lower chest: Chronic interstitial coarsening fibrosis. There is coronary vascular calcification. No intra-abdominal free air or fluid. Hepatobiliary: Subcentimeter hypodense lesion in the dome of the liver is too small to characterize the liver is otherwise the gallbladder is unremarkable Pancreas: Unremarkable. No pancreatic ductal dilatation or surrounding inflammatory changes. Spleen: Normal in size without focal abnormality. Adrenals/Urinary Tract: The adrenal glands are unremarkable. Multiple bilateral renal cysts measure up to 6.5 cm in the inferior pole the right kidney. Several smaller and subcentimeter hypodense lesions are too small to characterize. There is an 8 mm nonobstructing right renal inferior pole calculus. No hydronephrosis there is symmetric enhancement and excretion of contrast by both kidneys. The visualized ureters and urinary bladder appear unremarkable. Stomach/Bowel: There is sigmoid diverticulosis without active inflammatory changes there is no bowel obstruction or active inflammation. The appendix is not visualized with certainty. No inflammatory changes identified in the right  lower quadrant. Vascular/Lymphatic: Moderate aortoiliac atherosclerotic disease. There is a 2.6 cm infrarenal aortic ectasia. IVC is unremarkable. No portal venous gas. There is no adenopathy. Reproductive: Hysterectomy.  No adnexal masses. Other: None Musculoskeletal: Osteopenia with degenerative changes of the spine. No acute osseous pathology. IMPRESSION: 1. No acute intra-abdominal or pelvic pathology. 2. Sigmoid diverticulosis. No bowel obstruction. 3. A 8 mm nonobstructing right renal inferior pole calculus. No hydronephrosis. 4. Aortic Atherosclerosis (ICD10-I70.0). Electronically Signed   By: Anner Crete M.D.   On: 05/24/2021 19:59    ____________________________________________    PROCEDURES  Procedure(s) performed:    Procedures    Medications  dicyclomine (BENTYL) capsule 10 mg (has no administration in time range)  iohexol (OMNIPAQUE) 350 MG/ML injection 100 mL (100 mLs Intravenous Contrast Given 05/24/21 1912)     ____________________________________________   INITIAL IMPRESSION / ASSESSMENT AND PLAN / ED COURSE  Pertinent labs & imaging results that were available  during my care of the patient were reviewed by me and considered in my medical decision making (see chart for details).  Review of the [place] CSRS was performed in accordance of the [place] prior to dispensing any controlled drugs.           Patient's diagnosis is consistent with diarrhea.  Patient presented to the emergency department complaining of diarrhea x1 day.  Patient has had decreased appetite, had tomatoes and lettuce for a meal.  Patient then had diarrhea today.  Some tenderness in left lower quadrant on palpation so imaging and labs were obtained.  These are reassuring in nature with no acute infectious process identified.  Patiently treated with loperamide and Bentyl.  Follow-up primary care as needed. Patient is given ED precautions to return to the ED for any worsening or new  symptoms.     ____________________________________________  FINAL CLINICAL IMPRESSION(S) / ED DIAGNOSES  Final diagnoses:  Diarrhea, unspecified type      NEW MEDICATIONS STARTED DURING THIS VISIT:  ED Discharge Orders          Ordered    dicyclomine (BENTYL) 10 MG capsule  4 times daily        05/24/21 2003    loperamide (IMODIUM A-D) 2 MG tablet  4 times daily PRN        05/24/21 2003                This chart was dictated using voice recognition software/Dragon. Despite best efforts to proofread, errors can occur which can change the meaning. Any change was purely unintentional.    Brynda Peon 05/24/21 2004    Nena Polio, MD 05/24/21 2021

## 2021-05-27 ENCOUNTER — Emergency Department
Admission: EM | Admit: 2021-05-27 | Discharge: 2021-05-27 | Disposition: A | Discharge status: Home / Self Care | Payer: Medicare Other | Attending: Student in an Organized Health Care Education/Training Program | Admitting: Student in an Organized Health Care Education/Training Program

## 2021-05-27 ENCOUNTER — Other Ambulatory Visit: Payer: Self-pay

## 2021-05-27 DIAGNOSIS — S40022A Contusion of left upper arm, initial encounter: Secondary | ICD-10-CM | POA: Insufficient documentation

## 2021-05-27 DIAGNOSIS — Z87891 Personal history of nicotine dependence: Secondary | ICD-10-CM | POA: Diagnosis not present

## 2021-05-27 DIAGNOSIS — X58XXXA Exposure to other specified factors, initial encounter: Secondary | ICD-10-CM | POA: Diagnosis not present

## 2021-05-27 DIAGNOSIS — E119 Type 2 diabetes mellitus without complications: Secondary | ICD-10-CM | POA: Insufficient documentation

## 2021-05-27 DIAGNOSIS — Z79899 Other long term (current) drug therapy: Secondary | ICD-10-CM | POA: Insufficient documentation

## 2021-05-27 DIAGNOSIS — I10 Essential (primary) hypertension: Secondary | ICD-10-CM | POA: Insufficient documentation

## 2021-05-27 DIAGNOSIS — Z7901 Long term (current) use of anticoagulants: Secondary | ICD-10-CM | POA: Insufficient documentation

## 2021-05-27 DIAGNOSIS — Z7984 Long term (current) use of oral hypoglycemic drugs: Secondary | ICD-10-CM | POA: Diagnosis not present

## 2021-05-27 DIAGNOSIS — Z853 Personal history of malignant neoplasm of breast: Secondary | ICD-10-CM | POA: Insufficient documentation

## 2021-05-27 DIAGNOSIS — S4992XA Unspecified injury of left shoulder and upper arm, initial encounter: Secondary | ICD-10-CM | POA: Diagnosis present

## 2021-05-27 NOTE — Discharge Instructions (Addendum)
You can apply ice to left elbow for 5 to 10 minutes at a time.  Let skin warm up and then you can reapply. You can take Tylenol for discomfort.

## 2021-05-27 NOTE — ED Provider Notes (Signed)
[facility]-EMERGENCY DEPARTMENT  ____________________________________________  Time seen: Approximately 3:17 PM  I have reviewed the triage vital signs and the nursing notes.   HISTORY  Chief Complaint Bruise   Historian Patient     HPI Tracy Dickerson is a 80 y.o. female presents to the emergency department with a 5 cm x 5 cm hematoma at left axilla that developed after patient had IV access and blood work on 9 1.  Patient has bruising in various stages of healing along the bilateral hands from prior IV access sites.  Patient has no erythema or edema of the bilateral upper extremities.  No chest pain, chest tightness or abdominal pain.   Past Medical History:  Diagnosis Date   Cancer ([place])    Breast and lung   Diabetes mellitus without complication ([place])    GERD (gastroesophageal reflux disease)    Heart disease    Hypertension      Immunizations up to date:  Yes.     Past Medical History:  Diagnosis Date   Cancer ([place])    Breast and lung   Diabetes mellitus without complication ([place])    GERD (gastroesophageal reflux disease)    Heart disease    Hypertension     Patient Active Problem List   Diagnosis Date Noted   Constipation    UTI (urinary tract infection) 08/13/2020   HLD (hyperlipidemia) 08/13/2020   Acute renal failure superimposed on stage 3a chronic kidney disease ([place]) 08/13/2020   PAF (paroxysmal atrial fibrillation) ([place]) 08/13/2020   HTN (hypertension) 08/13/2020   Dehydration    Dehydration, moderate    Acute renal failure ([place]) 04/22/2020   Hyperkalemia    Septic shock (HCC)    Sepsis due to urinary tract infection ([place]) 03/13/2018   Acute kidney injury superimposed on chronic kidney disease ([place]) 02/13/2018   Closed extra-articular fracture of distal tibia 02/02/2018   Pneumonia 10/29/2017   Hypotension 01/26/2017    Past Surgical History:  Procedure Laterality Date   ABDOMINAL HYSTERECTOMY     APPENDECTOMY     BREAST SURGERY      breast cancer LEFT   OPEN REDUCTION INTERNAL FIXATION (ORIF) TIBIA/FIBULA FRACTURE Left 02/03/2018   Procedure: OPEN REDUCTION INTERNAL FIXATION (ORIF) DISTAL TIBIA FRACTURE;  Surgeon: Corky Mull, MD;  Location: [facility] ORS;  Service: Orthopedics;  Laterality: Left;  ankle/lower leg   TONSILLECTOMY      Prior to Admission medications   Medication Sig Start Date End Date Taking? Authorizing Provider  acetaminophen (TYLENOL) 500 MG tablet Take 500 mg by mouth every 6 (six) hours as needed.    [provider]  albuterol (PROAIR HFA) 108 (90 Base) MCG/ACT inhaler Inhale 2 puffs into the lungs every 4 (four) hours as needed for wheezing or shortness of breath.     [provider]  apixaban (ELIQUIS) 5 MG TABS tablet Take 5 mg by mouth 2 (two) times daily. 06/17/18   [provider]  atorvastatin (LIPITOR) 20 MG tablet Take by mouth. 05/01/20   [provider]  butalbital-acetaminophen-caffeine (FIORICET) 50-325-40 MG tablet Take 1 tablet by mouth every 6 (six) hours as needed for headache. 02/16/21 02/16/22  [name], [name], PA-C  dicyclomine (BENTYL) 10 MG capsule Take 1 capsule (10 mg total) by mouth 4 (four) times daily for 14 days. 05/24/21 06/07/21  [name], [name], PA-C  diphenhydramine-acetaminophen (TYLENOL PM) 25-500 MG TABS tablet Take 1-2 tablets by mouth at bedtime as needed (pain or sleep).     [provider]  gabapentin (NEURONTIN) 300 MG capsule Take 2 caspules by mouth at bedtime    [provider]  lisinopril (ZESTRIL) 10 MG tablet Take 10 mg by mouth daily. 06/28/20   [provider]  loperamide (IMODIUM A-D) 2 MG tablet Take 1 tablet (2 mg total) by mouth 4 (four) times daily as needed for diarrhea or loose stools. 05/24/21   [name], [name], PA-C  magic mouthwash SOLN Take 5 mLs by mouth 4 (four) times daily. Swish and swallow 09/11/20   [name], PA-C  metFORMIN (GLUCOPHAGE) 1000 MG tablet Take 1,000 mg  by mouth 2 (two) times daily with a meal.    [provider]  neomycin-polymyxin-pramoxine (NEOSPORIN PLUS) 1 % cream Apply topically 2 (two) times daily. 05/27/20   Sable Feil, PA-C  polyethylene glycol (MIRALAX / GLYCOLAX) 17 g packet Take 17 g by mouth daily. 08/16/20   [name], MD  senna-docusate (SENOKOT-S) 8.6-50 MG tablet Take 2 tablets by mouth at bedtime as needed for mild constipation. 08/15/20   [name], MD  SPIRIVA HANDIHALER 18 MCG inhalation capsule Place 1 capsule (18 mcg total) into inhaler and inhale daily. 04/23/20   [name], MD  traMADol (ULTRAM) 50 MG tablet Take 50 mg by mouth 2 (two) times daily as needed.     [provider]    Allergies Oxycodone, Percocet [oxycodone-acetaminophen], and Penicillins  Family History  Problem Relation Age of Onset   Cancer Mother     Social History Social History   Tobacco Use   Smoking status: Former   Smokeless tobacco: Never  Scientific laboratory technician Use: Never used  Substance Use Topics   Alcohol use: Not Currently   Drug use: No     Review of Systems  Constitutional: No fever/chills Eyes:  No discharge ENT: No upper respiratory complaints. Respiratory: no cough. No SOB/ use of accessory muscles to breath Gastrointestinal:   No nausea, no vomiting.  No diarrhea.  No constipation. Musculoskeletal: Negative for musculoskeletal pain. Skin: Patient has hematoma of left axilla.     ____________________________________________   PHYSICAL EXAM:  VITAL SIGNS: ED Triage Vitals  Enc Vitals Group     BP 05/27/21 1349 138/85     Pulse Rate 05/27/21 1349 89     Resp 05/27/21 1349 18     Temp 05/27/21 1349 97.6 F (36.4 C)     Temp Source 05/27/21 1349 Oral     SpO2 05/27/21 1349 97 %     Weight 05/27/21 1505 209 lb 7 oz (95 kg)     Height 05/27/21 1505 5\' 6"  (1.676 m)     Head Circumference --      Peak Flow --      Pain Score 05/27/21 1351 10     Pain Loc --      Pain Edu? --       Excl. in [place]? --      Constitutional: Alert and oriented. Well appearing and in no acute distress. Eyes: Conjunctivae are normal. PERRL. EOMI. Head: Atraumatic. ENT:      Nose: No congestion/rhinnorhea.      Mouth/Throat: Mucous membranes are moist.  Neck: No stridor.  No cervical spine tenderness to palpation. Cardiovascular: Normal rate, regular rhythm. Normal S1 and S2.  Good peripheral circulation. Respiratory: Normal respiratory effort without tachypnea or retractions. Lungs CTAB. Good air entry to the bases with no decreased or absent breath sounds Gastrointestinal: Bowel sounds x 4 quadrants.  Soft and nontender to palpation. No guarding or rigidity. No distention. Musculoskeletal: Full range of motion to all extremities. No obvious deformities noted Neurologic:  Normal for age. No gross focal neurologic deficits are appreciated.  Skin: Patient has 5 cm x 5 cm hematoma at left axilla.  No surrounding erythema or edema. Psychiatric: Mood and affect are normal for age. Speech and behavior are normal.   ____________________________________________   LABS (all labs ordered are listed, but only abnormal results are displayed)  Labs Reviewed - No data to display ____________________________________________  EKG   ____________________________________________  RADIOLOGY   No results found.  ____________________________________________    PROCEDURES  Procedure(s) performed:     Procedures     Medications - No data to display   ____________________________________________   INITIAL IMPRESSION / ASSESSMENT AND PLAN / ED COURSE  Pertinent labs & imaging results that were available during my care of the patient were reviewed by me and considered in my medical decision making (see chart for details).      Assessment and plan Bruise 80 year old female presents to the emergency department with a 5 cm x 5 cm hematoma at left axilla patient's labs from 9 1 were  reviewed which were reassuring.  Recommended ice application and gentle massage to resorb hematoma.  Return precautions were given to return with new or worsening symptoms.  All patient questions were answered.      ____________________________________________  FINAL CLINICAL IMPRESSION(S) / ED DIAGNOSES  Final diagnoses:  Arm bruise, left, initial encounter      NEW MEDICATIONS STARTED DURING THIS VISIT:  ED Discharge Orders     None           This chart was dictated using voice recognition software/Dragon. Despite best efforts to proofread, errors can occur which can change the meaning. Any change was purely unintentional.     Candela, Krul, PA-C 05/27/21 1521    Merlyn Lot, MD 05/27/21 [phone]

## 2021-05-27 NOTE — ED Triage Notes (Signed)
Pt comes with bruising on upper left arm from ultrasound IV placed on last visit. Not on thinners.

## 2021-05-27 NOTE — ED Notes (Signed)
See triage note  Presents with large bruised area to right [facility]  States she was seen on Thursday  Had an u/s guide IV placed   Developed bruise to that area

## 2021-06-05 ENCOUNTER — Telehealth: Payer: Self-pay | Admitting: Student

## 2021-06-05 NOTE — Telephone Encounter (Signed)
Palliative NP left several messages between patient's phone and her brother to confirm palliative appointment. No response. Will reschedule when call is returned.

## 2021-06-13 ENCOUNTER — Emergency Department
Admission: EM | Admit: 2021-06-13 | Discharge: 2021-06-13 | Disposition: A | Discharge status: Home / Self Care | Payer: Medicare Other | Attending: Emergency Medicine | Admitting: Emergency Medicine

## 2021-06-13 ENCOUNTER — Other Ambulatory Visit: Payer: Self-pay

## 2021-06-13 DIAGNOSIS — Z7901 Long term (current) use of anticoagulants: Secondary | ICD-10-CM | POA: Insufficient documentation

## 2021-06-13 DIAGNOSIS — N1831 Chronic kidney disease, stage 3a: Secondary | ICD-10-CM | POA: Diagnosis not present

## 2021-06-13 DIAGNOSIS — R42 Dizziness and giddiness: Secondary | ICD-10-CM | POA: Insufficient documentation

## 2021-06-13 DIAGNOSIS — Z79899 Other long term (current) drug therapy: Secondary | ICD-10-CM | POA: Insufficient documentation

## 2021-06-13 DIAGNOSIS — E1122 Type 2 diabetes mellitus with diabetic chronic kidney disease: Secondary | ICD-10-CM | POA: Diagnosis not present

## 2021-06-13 DIAGNOSIS — Z7984 Long term (current) use of oral hypoglycemic drugs: Secondary | ICD-10-CM | POA: Insufficient documentation

## 2021-06-13 DIAGNOSIS — Z853 Personal history of malignant neoplasm of breast: Secondary | ICD-10-CM | POA: Diagnosis not present

## 2021-06-13 DIAGNOSIS — N3000 Acute cystitis without hematuria: Secondary | ICD-10-CM | POA: Diagnosis not present

## 2021-06-13 DIAGNOSIS — I4891 Unspecified atrial fibrillation: Secondary | ICD-10-CM | POA: Insufficient documentation

## 2021-06-13 DIAGNOSIS — Z87891 Personal history of nicotine dependence: Secondary | ICD-10-CM | POA: Insufficient documentation

## 2021-06-13 DIAGNOSIS — I129 Hypertensive chronic kidney disease with stage 1 through stage 4 chronic kidney disease, or unspecified chronic kidney disease: Secondary | ICD-10-CM | POA: Diagnosis not present

## 2021-06-13 LAB — CBC
HCT: 36.9 % (ref 36.0–46.0)
Hemoglobin: 12.4 g/dL (ref 12.0–15.0)
MCH: 31 pg (ref 26.0–34.0)
MCHC: 33.6 g/dL (ref 30.0–36.0)
MCV: 92.3 fL (ref 80.0–100.0)
Platelets: 189 10*3/uL (ref 150–400)
RBC: 4 MIL/uL (ref 3.87–5.11)
RDW: 13.6 % (ref 11.5–15.5)
WBC: 9.5 10*3/uL (ref 4.0–10.5)
nRBC: 0 % (ref 0.0–0.2)

## 2021-06-13 LAB — BASIC METABOLIC PANEL
Anion gap: 9 (ref 5–15)
BUN: 21 mg/dL (ref 8–23)
CO2: 26 mmol/L (ref 22–32)
Calcium: 9.1 mg/dL (ref 8.9–10.3)
Chloride: 107 mmol/L (ref 98–111)
Creatinine, Ser: 0.94 mg/dL (ref 0.44–1.00)
GFR, Estimated: >60 mL/min (ref >60)
Glucose, Bld: 95 mg/dL (ref 70–99)
Potassium: 4.1 mmol/L (ref 3.5–5.1)
Sodium: 142 mmol/L (ref 135–145)

## 2021-06-13 LAB — URINALYSIS, COMPLETE (UACMP) WITH MICROSCOPIC
Bilirubin Urine: NEGATIVE
Glucose, UA: NEGATIVE mg/dL
Hgb urine dipstick: NEGATIVE
Ketones, ur: NEGATIVE mg/dL
Nitrite: NEGATIVE
Protein, ur: 30 mg/dL — AB
Specific Gravity, Urine: 1.019 (ref 1.005–1.030)
WBC, UA: >50 WBC/hpf — ABNORMAL HIGH (ref 0–5)
pH: 7 (ref 5.0–8.0)

## 2021-06-13 MED ORDER — CEPHALEXIN 500 MG PO CAPS: 500.0000 mg | ORAL_CAPSULE | Freq: Three times a day (TID) | ORAL | 0 refills | Status: DC

## 2021-06-13 MED ORDER — CEPHALEXIN 500 MG PO CAPS: 500.0000 mg | ORAL_CAPSULE | Freq: Four times a day (QID) | ORAL | 0 refills | Status: AC

## 2021-06-13 NOTE — ED Triage Notes (Signed)
Pt comes into the ED via EMS from home with c/o dizziness with nausea today.Marland Kitchen a/ox4.   [id number] 91HR 96%RA 148/51

## 2021-06-13 NOTE — ED Provider Notes (Signed)
Emergency Medicine Provider Triage Evaluation Note  Tracy Dickerson , a 80 y.o. female  was evaluated in triage.  Pt complains of dizziness and nausea. She denies syncope or chest pan.   Review of Systems  Positive: Dizziness & nausea Negative: CP/ SOB  Physical Exam  BP 132/77 (BP Location: Left Arm)   Pulse 84   Temp 98.4 F (36.9 C) (Oral)   Resp 16   SpO2 95%  Gen:   Awake, no distress  NAD Resp:  Normal effort CTA MSK:   Moves extremities without difficulty  Other:  CVS: RRR  Medical Decision Making  Medically screening exam initiated at 6:00 PM.  Appropriate orders placed.  SANAZ SCARLETT was informed that the remainder of the evaluation will be completed by another provider, this initial triage assessment does not replace that evaluation, and the importance of remaining in the ED until their evaluation is complete.  Patient presents to the ED for evaluation of dizziness and nausea.    Melvenia Needles, PA-C 06/13/21 1802    Vladimir Crofts, MD 06/13/21 [phone]

## 2021-06-13 NOTE — ED Provider Notes (Addendum)
[facility] Emergency Department Provider Note ____________________________________________   Event Date/Time   First MD Initiated Contact with Patient 06/13/21 1900     (approximate)  I have reviewed the triage vital signs and the nursing notes.  HISTORY  Chief Complaint Dizziness   HPI Tracy Dickerson is a 80 y.o. femalewho presents to the ED for evaluation of dizziness and nausea.  Chart review indicates obesity, GERD, HTN and DM.  Paroxysmal A. fib on Eliquis.  COPD.  Patient presents to the ED for evaluation of an episode of dizziness that occurred earlier today.  She reports going to [place] today with her brother, ambulating with a store without difficulty with her walker, then as they were walking out to the car she got a diet Pepsi.  She reports developing dizziness after drinking his diet Pepsi, and felt dizzy while ambulating back into their house.  Denies any falls or syncopal episodes.  Due to the sensation of dizziness, her brother urged her to get checked out in the ED.  She reports feeling better now and is back to baseline.  Reports feeling hungry, but is denying food, saying that her brother cooks something good at home and she will just wait until she gets that there.  No needs.  Past Medical History:  Diagnosis Date   Cancer ([place])    Breast and lung   Diabetes mellitus without complication ([place])    GERD (gastroesophageal reflux disease)    Heart disease    Hypertension     Patient Active Problem List   Diagnosis Date Noted   Constipation    UTI (urinary tract infection) 08/13/2020   HLD (hyperlipidemia) 08/13/2020   Acute renal failure superimposed on stage 3a chronic kidney disease ([place]) 08/13/2020   PAF (paroxysmal atrial fibrillation) ([place]) 08/13/2020   HTN (hypertension) 08/13/2020   Dehydration    Dehydration, moderate    Acute renal failure ([place]) 04/22/2020   Hyperkalemia    Septic shock (HCC)    Sepsis due to urinary  tract infection ([place]) 03/13/2018   Acute kidney injury superimposed on chronic kidney disease ([place]) 02/13/2018   Closed extra-articular fracture of distal tibia 02/02/2018   Pneumonia 10/29/2017   Hypotension 01/26/2017    Past Surgical History:  Procedure Laterality Date   ABDOMINAL HYSTERECTOMY     APPENDECTOMY     BREAST SURGERY     breast cancer LEFT   OPEN REDUCTION INTERNAL FIXATION (ORIF) TIBIA/FIBULA FRACTURE Left 02/03/2018   Procedure: OPEN REDUCTION INTERNAL FIXATION (ORIF) DISTAL TIBIA FRACTURE;  Surgeon: Corky Mull, MD;  Location: [facility] ORS;  Service: Orthopedics;  Laterality: Left;  ankle/lower leg   TONSILLECTOMY      Prior to Admission medications   Medication Sig Start Date End Date Taking? Authorizing Provider  cephALEXin (KEFLEX) 500 MG capsule Take 1 capsule (500 mg total) by mouth 4 (four) times daily for 5 days. 06/13/21 06/18/21 Yes Vladimir Crofts, MD  acetaminophen (TYLENOL) 500 MG tablet Take 500 mg by mouth every 6 (six) hours as needed.    [provider]  albuterol (PROAIR HFA) 108 (90 Base) MCG/ACT inhaler Inhale 2 puffs into the lungs every 4 (four) hours as needed for wheezing or shortness of breath.     [provider]  apixaban (ELIQUIS) 5 MG TABS tablet Take 5 mg by mouth 2 (two) times daily. 06/17/18   [provider]  atorvastatin (LIPITOR) 20 MG tablet Take by mouth. 05/01/20   [provider]  butalbital-acetaminophen-caffeine (FIORICET) 50-325-40 MG tablet Take 1 tablet by mouth every 6 (six) hours as needed for headache. 02/16/21 02/16/22  [name], [name], PA-C  dicyclomine (BENTYL) 10 MG capsule Take 1 capsule (10 mg total) by mouth 4 (four) times daily for 14 days. 05/24/21 06/07/21  [name], [name], PA-C  diphenhydramine-acetaminophen (TYLENOL PM) 25-500 MG TABS tablet Take 1-2 tablets by mouth at bedtime as needed (pain or sleep).     [provider]  gabapentin (NEURONTIN) 300 MG capsule Take 2  caspules by mouth at bedtime    [provider]  lisinopril (ZESTRIL) 10 MG tablet Take 10 mg by mouth daily. 06/28/20   [provider]  loperamide (IMODIUM A-D) 2 MG tablet Take 1 tablet (2 mg total) by mouth 4 (four) times daily as needed for diarrhea or loose stools. 05/24/21   [name], [name], PA-C  magic mouthwash SOLN Take 5 mLs by mouth 4 (four) times daily. Swish and swallow 09/11/20   [name], PA-C  metFORMIN (GLUCOPHAGE) 1000 MG tablet Take 1,000 mg by mouth 2 (two) times daily with a meal.    [provider]  neomycin-polymyxin-pramoxine (NEOSPORIN PLUS) 1 % cream Apply topically 2 (two) times daily. 05/27/20   Sable Feil, PA-C  polyethylene glycol (MIRALAX / GLYCOLAX) 17 g packet Take 17 g by mouth daily. 08/16/20   [name], MD  senna-docusate (SENOKOT-S) 8.6-50 MG tablet Take 2 tablets by mouth at bedtime as needed for mild constipation. 08/15/20   [name], MD  SPIRIVA HANDIHALER 18 MCG inhalation capsule Place 1 capsule (18 mcg total) into inhaler and inhale daily. 04/23/20   [name], MD  traMADol (ULTRAM) 50 MG tablet Take 50 mg by mouth 2 (two) times daily as needed.     [provider]    Allergies Oxycodone, Percocet [oxycodone-acetaminophen], and Penicillins  Family History  Problem Relation Age of Onset   Cancer Mother     Social History Social History   Tobacco Use   Smoking status: Former   Smokeless tobacco: Never  Scientific laboratory technician Use: Never used  Substance Use Topics   Alcohol use: Not Currently   Drug use: No   Review of Systems  Constitutional: No fever/chills. Dizzines, now resolved.  Eyes: No visual changes. ENT: No sore throat. Cardiovascular: Denies chest pain. Respiratory: Denies shortness of breath. Gastrointestinal: No abdominal pain.  No nausea, no vomiting.  No diarrhea.  No constipation. Genitourinary: Negative for dysuria. Musculoskeletal: Negative for back pain. Skin:  Negative for rash. Neurological: Negative for headaches, focal weakness or numbness. ____________________________________________   PHYSICAL EXAM:  VITAL SIGNS: Vitals:   06/13/21 1757  BP: 132/77  Pulse: 84  Resp: 16  Temp: 98.4 F (36.9 C)  SpO2: 95%    Constitutional: Alert and oriented. Well appearing and in no acute distress. Eyes: Conjunctivae are normal. PERRL. EOMI. Head: Atraumatic. Nose: No congestion/rhinnorhea. Mouth/Throat: Mucous membranes are moist.  Oropharynx non-erythematous. Neck: No stridor. No cervical spine tenderness to palpation. Cardiovascular: Normal rate, regular rhythm. Grossly normal heart sounds.  Good peripheral circulation. Respiratory: Normal respiratory effort.  No retractions. Lungs CTAB. Gastrointestinal: Soft , nondistended, nontender to palpation. No CVA tenderness. Musculoskeletal: No lower extremity tenderness nor edema.  No joint effusions. No signs of acute trauma. Neurologic:  Normal speech and language. No gross focal neurologic deficits are appreciated. No gait instability noted. Cranial nerves II through XII intact 5/5 strength and sensation in all 4 extremities Skin:  Skin is warm, dry and intact. No rash noted. Psychiatric: Mood and affect are normal. Speech and behavior are normal. ____________________________________________   LABS (all labs ordered are listed, but only abnormal results are displayed)  Labs Reviewed  URINALYSIS, COMPLETE (UACMP) WITH MICROSCOPIC - Abnormal; Notable for the following components:      Result Value   Color, Urine YELLOW (*)    APPearance CLOUDY (*)    Protein, ur 30 (*)    Leukocytes,Ua MODERATE (*)    WBC, UA >50 (*)    Bacteria, UA MANY (*)    All other components within normal limits  URINE CULTURE  BASIC METABOLIC PANEL  CBC  CBG MONITORING, ED   ____________________________________________  12 Lead EKG  Sinus rhythm, rate of 86 bpm.  Normal axis and intervals.  No evidence of  acute ischemia. ____________________________________________  RADIOLOGY  ED MD interpretation:    Official radiology report(s): No results found.  ____________________________________________   PROCEDURES and INTERVENTIONS  Procedure(s) performed (including Critical Care):  Procedures  Medications - No data to display  ____________________________________________   MDM / ED COURSE   80 year old woman presents to the ED with resolved dizziness, without evidence of acute pathology.  Normal vitals and blood work.  No evidence of neurologic vascular deficits.  No persistent dizziness.  No indication for CNS imaging.  EKG without interval changes or signs of cardiac pathology.  We will send for a urine to ensure no evidence of acute cystitis contributing to her symptoms, but anticipate outpatient management regardless, plus minus antibiotics.  Urine does return concerning for acute cystitis.  We will discharge with 5 days of antibiotics and return precautions for the ED.  ____________________________________________   FINAL CLINICAL IMPRESSION(S) / ED DIAGNOSES  Final diagnoses:  Dizziness  Acute cystitis without hematuria     ED Discharge Orders          Ordered    cephALEXin (KEFLEX) 500 MG capsule  3 times daily,   Status:  Discontinued        06/13/21 2022    cephALEXin (KEFLEX) 500 MG capsule  4 times daily        06/13/21 2025             Tracy Dickerson   Note:  This document was prepared using Dragon voice recognition software and may include unintentional dictation errors.    Vladimir Crofts, MD 06/13/21 2006    Vladimir Crofts, MD 06/13/21 2046

## 2021-06-13 NOTE — Discharge Instructions (Signed)
Please take the Keflex antibiotic 3 times daily for the next 5 days to treat UTI.  Follow-up with your PCP.  Return to the ED with any worsening symptoms.

## 2021-06-16 LAB — URINE CULTURE: Culture: >=100000 — AB

## 2021-06-18 ENCOUNTER — Other Ambulatory Visit: Payer: Self-pay

## 2021-06-18 ENCOUNTER — Emergency Department
Admission: EM | Admit: 2021-06-18 | Discharge: 2021-06-18 | Disposition: A | Discharge status: Home / Self Care | Payer: Medicare Other | Attending: Emergency Medicine | Admitting: Emergency Medicine

## 2021-06-18 DIAGNOSIS — Z7901 Long term (current) use of anticoagulants: Secondary | ICD-10-CM | POA: Insufficient documentation

## 2021-06-18 DIAGNOSIS — Z87891 Personal history of nicotine dependence: Secondary | ICD-10-CM | POA: Diagnosis not present

## 2021-06-18 DIAGNOSIS — M79662 Pain in left lower leg: Secondary | ICD-10-CM | POA: Diagnosis present

## 2021-06-18 DIAGNOSIS — I129 Hypertensive chronic kidney disease with stage 1 through stage 4 chronic kidney disease, or unspecified chronic kidney disease: Secondary | ICD-10-CM | POA: Diagnosis not present

## 2021-06-18 DIAGNOSIS — G8929 Other chronic pain: Secondary | ICD-10-CM | POA: Diagnosis not present

## 2021-06-18 DIAGNOSIS — Z85118 Personal history of other malignant neoplasm of bronchus and lung: Secondary | ICD-10-CM | POA: Diagnosis not present

## 2021-06-18 DIAGNOSIS — E1122 Type 2 diabetes mellitus with diabetic chronic kidney disease: Secondary | ICD-10-CM | POA: Diagnosis not present

## 2021-06-18 DIAGNOSIS — Z79899 Other long term (current) drug therapy: Secondary | ICD-10-CM | POA: Diagnosis not present

## 2021-06-18 DIAGNOSIS — Z7984 Long term (current) use of oral hypoglycemic drugs: Secondary | ICD-10-CM | POA: Insufficient documentation

## 2021-06-18 DIAGNOSIS — Z853 Personal history of malignant neoplasm of breast: Secondary | ICD-10-CM | POA: Insufficient documentation

## 2021-06-18 DIAGNOSIS — N1831 Chronic kidney disease, stage 3a: Secondary | ICD-10-CM | POA: Diagnosis not present

## 2021-06-18 NOTE — ED Notes (Signed)
Pt comes via EMs from home with leg pain for awhile. VSS per EMs

## 2021-06-18 NOTE — ED Provider Notes (Signed)
[facility] Emergency Department Provider Note  _______________________________________   I have reviewed the triage vital signs and the nursing notes.   HISTORY  Chief Complaint Leg Pain   History limited by: Not Limited   HPI EKAM BONEBRAKE is a 80 y.o. female who presents to the emergency department today because of concern for left leg pain. This pain has been present for years since she broke it. Unclear initially why she decided to seek care for it today. When I did ask how she felt we could help for her chronic pain today she states that she would like some pain medication. She states that she takes tramadol.     Records reviewed. Per medical record review patient has a history of ER visit 2 months ago for left leg pain.  Past Medical History:  Diagnosis Date   Cancer ([place])    Breast and lung   Diabetes mellitus without complication ([place])    GERD (gastroesophageal reflux disease)    Heart disease    Hypertension     Patient Active Problem List   Diagnosis Date Noted   Constipation    UTI (urinary tract infection) 08/13/2020   HLD (hyperlipidemia) 08/13/2020   Acute renal failure superimposed on stage 3a chronic kidney disease ([place]) 08/13/2020   PAF (paroxysmal atrial fibrillation) ([place]) 08/13/2020   HTN (hypertension) 08/13/2020   Dehydration    Dehydration, moderate    Acute renal failure ([place]) 04/22/2020   Hyperkalemia    Septic shock (HCC)    Sepsis due to urinary tract infection ([place]) 03/13/2018   Acute kidney injury superimposed on chronic kidney disease ([place]) 02/13/2018   Closed extra-articular fracture of distal tibia 02/02/2018   Pneumonia 10/29/2017   Hypotension 01/26/2017    Past Surgical History:  Procedure Laterality Date   ABDOMINAL HYSTERECTOMY     APPENDECTOMY     BREAST SURGERY     breast cancer LEFT   OPEN REDUCTION INTERNAL FIXATION (ORIF) TIBIA/FIBULA FRACTURE Left 02/03/2018   Procedure: OPEN REDUCTION  INTERNAL FIXATION (ORIF) DISTAL TIBIA FRACTURE;  Surgeon: Corky Mull, MD;  Location: [facility] ORS;  Service: Orthopedics;  Laterality: Left;  ankle/lower leg   TONSILLECTOMY      Prior to Admission medications   Medication Sig Start Date End Date Taking? Authorizing Provider  acetaminophen (TYLENOL) 500 MG tablet Take 500 mg by mouth every 6 (six) hours as needed.    [provider]  albuterol (PROAIR HFA) 108 (90 Base) MCG/ACT inhaler Inhale 2 puffs into the lungs every 4 (four) hours as needed for wheezing or shortness of breath.     [provider]  apixaban (ELIQUIS) 5 MG TABS tablet Take 5 mg by mouth 2 (two) times daily. 06/17/18   [provider]  atorvastatin (LIPITOR) 20 MG tablet Take by mouth. 05/01/20   [provider]  butalbital-acetaminophen-caffeine (FIORICET) 50-325-40 MG tablet Take 1 tablet by mouth every 6 (six) hours as needed for headache. 02/16/21 02/16/22  [name], [name], PA-C  cephALEXin (KEFLEX) 500 MG capsule Take 1 capsule (500 mg total) by mouth 4 (four) times daily for 5 days. 06/13/21 06/18/21  Vladimir Crofts, MD  dicyclomine (BENTYL) 10 MG capsule Take 1 capsule (10 mg total) by mouth 4 (four) times daily for 14 days. 05/24/21 06/07/21  [name], [name], PA-C  diphenhydramine-acetaminophen (TYLENOL PM) 25-500 MG TABS tablet Take 1-2 tablets by mouth at bedtime as needed (pain or sleep).     [provider]  gabapentin (NEURONTIN) 300 MG capsule Take 2 caspules by mouth at bedtime    [provider]  lisinopril (ZESTRIL) 10 MG tablet Take 10 mg by mouth daily. 06/28/20   [provider]  loperamide (IMODIUM A-D) 2 MG tablet Take 1 tablet (2 mg total) by mouth 4 (four) times daily as needed for diarrhea or loose stools. 05/24/21   [name], [name], PA-C  magic mouthwash SOLN Take 5 mLs by mouth 4 (four) times daily. Swish and swallow 09/11/20   [name], PA-C  metFORMIN (GLUCOPHAGE) 1000 MG tablet  Take 1,000 mg by mouth 2 (two) times daily with a meal.    [provider]  neomycin-polymyxin-pramoxine (NEOSPORIN PLUS) 1 % cream Apply topically 2 (two) times daily. 05/27/20   Sable Feil, PA-C  polyethylene glycol (MIRALAX / GLYCOLAX) 17 g packet Take 17 g by mouth daily. 08/16/20   [name], MD  senna-docusate (SENOKOT-S) 8.6-50 MG tablet Take 2 tablets by mouth at bedtime as needed for mild constipation. 08/15/20   [name], MD  SPIRIVA HANDIHALER 18 MCG inhalation capsule Place 1 capsule (18 mcg total) into inhaler and inhale daily. 04/23/20   [name], MD  traMADol (ULTRAM) 50 MG tablet Take 50 mg by mouth 2 (two) times daily as needed.     [provider]    Allergies Oxycodone, Percocet [oxycodone-acetaminophen], and Penicillins  Family History  Problem Relation Age of Onset   Cancer Mother     Social History Social History   Tobacco Use   Smoking status: Former   Smokeless tobacco: Never  Scientific laboratory technician Use: Never used  Substance Use Topics   Alcohol use: Not Currently   Drug use: No    Review of Systems Constitutional: No fever/chills Eyes: No visual changes. ENT: No sore throat. Cardiovascular: Denies chest pain. Respiratory: Denies shortness of breath. Gastrointestinal: No abdominal pain.  No nausea, no vomiting.  No diarrhea.   Genitourinary: Negative for dysuria. Musculoskeletal: Positive for chronic left leg pain. Skin: Negative for rash. Neurological: Negative for headaches, focal weakness or numbness.  ____________________________________________   PHYSICAL EXAM:  VITAL SIGNS: ED Triage Vitals  Enc Vitals Group     BP 06/18/21 1605 133/77     Pulse Rate 06/18/21 1605 86     Resp 06/18/21 1605 17     Temp 06/18/21 1605 98 F (36.7 C)     Temp src --      SpO2 06/18/21 1605 100 %     Weight --      Height --      Head Circumference --      Peak Flow --      Pain Score 06/18/21 1606 10   Constitutional:  Alert and oriented.  Eyes: Conjunctivae are normal.  ENT      Head: Normocephalic and atraumatic.      Nose: No congestion/rhinnorhea.      Mouth/Throat: Mucous membranes are moist.      Neck: No stridor. Hematological/Lymphatic/Immunilogical: No cervical lymphadenopathy. Cardiovascular: Normal rate, regular rhythm.  No murmurs, rubs, or gallops.  Respiratory: Normal respiratory effort without tachypnea nor retractions. Breath sounds are clear and equal bilaterally. No wheezes/rales/rhonchi. Gastrointestinal: Soft and non tender. No rebound. No guarding.  Genitourinary: Deferred Musculoskeletal: No swelling or erythema to left leg. DP 2+. Neurologic:  Normal speech and language. No gross focal neurologic deficits are appreciated.  Skin:  Skin is warm, dry and intact. No rash noted. Psychiatric: Mood  and affect are normal. Speech and behavior are normal. Patient exhibits appropriate insight and judgment.  ____________________________________________    LABS (pertinent positives/negatives)  None  ____________________________________________   EKG  None  ____________________________________________    RADIOLOGY  None  ____________________________________________   PROCEDURES  Procedures  ____________________________________________   INITIAL IMPRESSION / ASSESSMENT AND PLAN / ED COURSE  Pertinent labs & imaging results that were available during my care of the patient were reviewed by me and considered in my medical decision making (see chart for details).   Patient presented to the ER today for chronic left leg pain. Did discuss with patient that we would not be prescribing narcotics for chronic pain. Did offer prescription for tylenol or ibuprofen which the patient declined. Did recommend continued follow up with her outpatient providers.   ____________________________________________   FINAL CLINICAL IMPRESSION(S) / ED DIAGNOSES  Final diagnoses:  Chronic  pain of left lower extremity     Note: This dictation was prepared with Dragon dictation. Any transcriptional errors that result from this process are unintentional     Nance Pear, MD 06/18/21 [phone]

## 2021-06-18 NOTE — ED Triage Notes (Signed)
Pt comes with c/o left leg pain. Pt states she had fallen awhile back and injured leg. Pt states pain since then.

## 2021-06-18 NOTE — Discharge Instructions (Addendum)
Please seek medical attention for any high fevers, chest pain, shortness of breath, change in behavior, persistent vomiting, bloody stool or any other new or concerning symptoms.  

## 2021-06-24 ENCOUNTER — Emergency Department
Admission: EM | Admit: 2021-06-24 | Discharge: 2021-06-24 | Disposition: A | Discharge status: Home / Self Care | Payer: Medicare Other | Attending: Emergency Medicine | Admitting: Emergency Medicine

## 2021-06-24 ENCOUNTER — Other Ambulatory Visit: Payer: Self-pay

## 2021-06-24 ENCOUNTER — Encounter: Payer: Self-pay | Admitting: Emergency Medicine

## 2021-06-24 DIAGNOSIS — Z853 Personal history of malignant neoplasm of breast: Secondary | ICD-10-CM | POA: Diagnosis not present

## 2021-06-24 DIAGNOSIS — Z87891 Personal history of nicotine dependence: Secondary | ICD-10-CM | POA: Insufficient documentation

## 2021-06-24 DIAGNOSIS — Z79899 Other long term (current) drug therapy: Secondary | ICD-10-CM | POA: Insufficient documentation

## 2021-06-24 DIAGNOSIS — Z20822 Contact with and (suspected) exposure to covid-19: Secondary | ICD-10-CM | POA: Insufficient documentation

## 2021-06-24 DIAGNOSIS — Z85118 Personal history of other malignant neoplasm of bronchus and lung: Secondary | ICD-10-CM | POA: Insufficient documentation

## 2021-06-24 DIAGNOSIS — Z7984 Long term (current) use of oral hypoglycemic drugs: Secondary | ICD-10-CM | POA: Insufficient documentation

## 2021-06-24 DIAGNOSIS — Z7901 Long term (current) use of anticoagulants: Secondary | ICD-10-CM | POA: Diagnosis not present

## 2021-06-24 DIAGNOSIS — T881XXA Other complications following immunization, not elsewhere classified, initial encounter: Secondary | ICD-10-CM

## 2021-06-24 DIAGNOSIS — T50Z95A Adverse effect of other vaccines and biological substances, initial encounter: Secondary | ICD-10-CM | POA: Insufficient documentation

## 2021-06-24 DIAGNOSIS — E119 Type 2 diabetes mellitus without complications: Secondary | ICD-10-CM | POA: Diagnosis not present

## 2021-06-24 DIAGNOSIS — I10 Essential (primary) hypertension: Secondary | ICD-10-CM | POA: Insufficient documentation

## 2021-06-24 DIAGNOSIS — R519 Headache, unspecified: Secondary | ICD-10-CM | POA: Diagnosis not present

## 2021-06-24 LAB — RESP PANEL BY RT-PCR (FLU A&B, COVID) ARPGX2
Influenza A by PCR: NEGATIVE
Influenza B by PCR: NEGATIVE
SARS Coronavirus 2 by RT PCR: NEGATIVE

## 2021-06-24 NOTE — Discharge Instructions (Signed)
You can take 650 mg of Tylenol every 6 hours as needed for body aches and headache.

## 2021-06-24 NOTE — ED Triage Notes (Signed)
Pt reports got the pneumonia shot yesterday and today is sick with everything from ear pain to SOB, bodyaches and nausea.

## 2021-06-24 NOTE — ED Provider Notes (Signed)
Emergency Medicine Provider Triage Evaluation Note  Tracy Dickerson , a 80 y.o. female  was evaluated in triage.  Pt complains of headache, lightheadedness, runny nose, nasal congestion, sore throat, cough, shortness of breath and diarrhea.  She reports this started yesterday after getting her flu shot.  She denies nausea, vomiting, she feels like she is going to pass out..  She denies fever, chills or body aches.  She has not had known COVID exposure.  Review of Systems  Positive: Headache, lightheadedness, runny nose, nasal congestion, sore throat, cough, shortness of breath diarrhea Negative: Ear pain, chest pain, nausea, vomiting, fever, chills or body aches  Physical Exam  There were no vitals taken for this visit. Gen:   Awake, no distress   Resp:  Normal effort  Neuro:  Alert and oriented  Medical Decision Making  Medically screening exam initiated at 1:32 PM.  Appropriate orders placed.  Tracy Dickerson was informed that the remainder of the evaluation will be completed by another provider, this initial triage assessment does not replace that evaluation, and the importance of remaining in the ED until their evaluation is complete.     Jearld Fenton, NP 06/24/21 1334    Lucrezia Starch, MD 06/24/21 [phone]

## 2021-06-24 NOTE — ED Provider Notes (Signed)
[facility]EMERGENCY DEPARTMENT  ____________________________________________  Time seen: Approximately 3:36 PM  I have reviewed the triage vital signs and the nursing notes.   HISTORY  Chief Complaint COVID sx's and Dizziness   Historian Patient     HPI Tracy Dickerson is a 80 y.o. female with a history of hypertension, diabetes and GERD, presents to the emergency department with headache and generalized body aches after having her flu shot yesterday.  She denies fever, chills, chest tightness or chest pain.  Patient is requesting COVID-19 testing.   Past Medical History:  Diagnosis Date   Cancer ([place])    Breast and lung   Diabetes mellitus without complication ([place])    GERD (gastroesophageal reflux disease)    Heart disease    Hypertension      Immunizations up to date:  Yes.     Past Medical History:  Diagnosis Date   Cancer ([place])    Breast and lung   Diabetes mellitus without complication ([place])    GERD (gastroesophageal reflux disease)    Heart disease    Hypertension     Patient Active Problem List   Diagnosis Date Noted   Constipation    UTI (urinary tract infection) 08/13/2020   HLD (hyperlipidemia) 08/13/2020   Acute renal failure superimposed on stage 3a chronic kidney disease ([place]) 08/13/2020   PAF (paroxysmal atrial fibrillation) ([place]) 08/13/2020   HTN (hypertension) 08/13/2020   Dehydration    Dehydration, moderate    Acute renal failure ([place]) 04/22/2020   Hyperkalemia    Septic shock (H[facility])    Sepsis due to urinary tract infection ([place]) 03/13/2018   Acute kidney injury superimposed on chronic kidney disease ([place]) 02/13/2018   Closed extra-articular fracture of distal tibia 02/02/2018   Pneumonia 10/29/2017   Hypotension 01/26/2017    Past Surgical History:  Procedure Laterality Date   ABDOMINAL HYSTERECTOMY     APPENDECTOMY     BREAST SURGERY     breast cancer LEFT   OPEN REDUCTION INTERNAL FIXATION (ORIF) TIBIA/FIBULA FRACTURE Left  02/03/2018   Procedure: OPEN REDUCTION INTERNAL FIXATION (ORIF) DISTAL TIBIA FRACTURE;  Surgeon: Corky Mull, MD;  Location: [facility] ORS;  Service: Orthopedics;  Laterality: Left;  ankle/lower leg   TONSILLECTOMY      Prior to Admission medications   Medication Sig Start Date End Date Taking? Authorizing Provider  acetaminophen (TYLENOL) 500 MG tablet Take 500 mg by mouth every 6 (six) hours as needed.    [provider]  albuterol (PROAIR HFA) 108 (90 Base) MCG/ACT inhaler Inhale 2 puffs into the lungs every 4 (four) hours as needed for wheezing or shortness of breath.     [provider]  apixaban (ELIQUIS) 5 MG TABS tablet Take 5 mg by mouth 2 (two) times daily. 06/17/18   [provider]  atorvastatin (LIPITOR) 20 MG tablet Take by mouth. 05/01/20   [provider]  butalbital-acetaminophen-caffeine (FIORICET) 50-325-40 MG tablet Take 1 tablet by mouth every 6 (six) hours as needed for headache. 02/16/21 02/16/22  [name], [name], PA-C  dicyclomine (BENTYL) 10 MG capsule Take 1 capsule (10 mg total) by mouth 4 (four) times daily for 14 days. 05/24/21 06/07/21  [name], [name], PA-C  diphenhydramine-acetaminophen (TYLENOL PM) 25-500 MG TABS tablet Take 1-2 tablets by mouth at bedtime as needed (pain or sleep).     [provider]  gabapentin (NEURONTIN) 300 MG capsule Take 2 caspules by mouth at bedtime    [provider]  lisinopril (ZESTRIL)  10 MG tablet Take 10 mg by mouth daily. 06/28/20   [provider]  loperamide (IMODIUM A-D) 2 MG tablet Take 1 tablet (2 mg total) by mouth 4 (four) times daily as needed for diarrhea or loose stools. 05/24/21   [name], [name], PA-C  magic mouthwash SOLN Take 5 mLs by mouth 4 (four) times daily. Swish and swallow 09/11/20   [name], PA-C  metFORMIN (GLUCOPHAGE) 1000 MG tablet Take 1,000 mg by mouth 2 (two) times daily with a meal.    [provider]   neomycin-polymyxin-pramoxine (NEOSPORIN PLUS) 1 % cream Apply topically 2 (two) times daily. 05/27/20   Sable Feil, PA-C  polyethylene glycol (MIRALAX / GLYCOLAX) 17 g packet Take 17 g by mouth daily. 08/16/20   [name], MD  senna-docusate (SENOKOT-S) 8.6-50 MG tablet Take 2 tablets by mouth at bedtime as needed for mild constipation. 08/15/20   [name], MD  SPIRIVA HANDIHALER 18 MCG inhalation capsule Place 1 capsule (18 mcg total) into inhaler and inhale daily. 04/23/20   [name], MD  traMADol (ULTRAM) 50 MG tablet Take 50 mg by mouth 2 (two) times daily as needed.     [provider]    Allergies Oxycodone, Percocet [oxycodone-acetaminophen], and Penicillins  Family History  Problem Relation Age of Onset   Cancer Mother     Social History Social History   Tobacco Use   Smoking status: Former   Smokeless tobacco: Never  Scientific laboratory technician Use: Never used  Substance Use Topics   Alcohol use: Not Currently   Drug use: No     Review of Systems  Constitutional: No fever/chills Eyes:  No discharge ENT: No upper respiratory complaints. Respiratory: no cough. No SOB/ use of accessory muscles to breath Gastrointestinal:   No nausea, no vomiting.  No diarrhea.  No constipation. Musculoskeletal: Patient has body aches. Skin: Negative for rash, abrasions, lacerations, ecchymosis.    ____________________________________________   PHYSICAL EXAM:  VITAL SIGNS: ED Triage Vitals  Enc Vitals Group     BP 06/24/21 1335 (!) 147/83     Pulse Rate 06/24/21 1335 96     Resp 06/24/21 1335 20     Temp 06/24/21 1335 98 F (36.7 C)     Temp Source 06/24/21 1335 Oral     SpO2 06/24/21 1335 96 %     Weight 06/24/21 1332 209 lb (94.8 kg)     Height 06/24/21 1332 5\' 6"  (1.676 m)     Head Circumference --      Peak Flow --      Pain Score 06/24/21 1332 10     Pain Loc --      Pain Edu? --      Excl. in [place]? --      Constitutional: Alert and oriented.  Patient is lying supine. Eyes: Conjunctivae are normal. PERRL. EOMI. Head: Atraumatic. ENT:      Ears: Tympanic membranes are mildly injected with mild effusion bilaterally.       Nose: No congestion/rhinnorhea.      Mouth/Throat: Mucous membranes are moist. Posterior pharynx is mildly erythematous.  Hematological/Lymphatic/Immunilogical: No cervical lymphadenopathy.  Cardiovascular: Normal rate, regular rhythm. Normal S1 and S2.  Good peripheral circulation. Respiratory: Normal respiratory effort without tachypnea or retractions. Lungs CTAB. Good air entry to the bases with no decreased or absent breath sounds. Gastrointestinal: Bowel sounds 4 quadrants. Soft and nontender to palpation. No guarding or rigidity. No palpable masses. No distention.  No CVA tenderness. Musculoskeletal: Full range of motion to all extremities. No gross deformities appreciated. Neurologic:  Normal speech and language. No gross focal neurologic deficits are appreciated.  Skin:  Skin is warm, dry and intact. No rash noted. Psychiatric: Mood and affect are normal. Speech and behavior are normal. Patient exhibits appropriate insight and judgement.   ____________________________________________   LABS (all labs ordered are listed, but only abnormal results are displayed)  Labs Reviewed  RESP PANEL BY RT-PCR (FLU A&B, COVID) [id number]   ____________________________________________  EKG   ____________________________________________  RADIOLOGY   No results found.  ____________________________________________    PROCEDURES  Procedure(s) performed:     Procedures     Medications - No data to display   ____________________________________________   INITIAL IMPRESSION / ASSESSMENT AND PLAN / ED COURSE  Pertinent labs & imaging results that were available during my care of the patient were reviewed by me and considered in my medical decision making (see chart for details).      Assessment  and plan Post immunization reaction 80 year old female presents to the emergency department with some generalized malaise and mild headache after having her flu shot yesterday.  COVID-19 and influenza testing were obtained in triage and were negative.  Patient's labs from 9/21 were reviewed and are reassuring.  Patient states that she took Keflex as directed prescribed from 921 and denies worsening dysuria, dizziness or fever.  Recommended observing symptoms at home.  Recommended recheck in 2 days if symptoms do not seem to be improving.   ____________________________________________  FINAL CLINICAL IMPRESSION(S) / ED DIAGNOSES  Final diagnoses:  Post-immunization reaction, initial encounter      NEW MEDICATIONS STARTED DURING THIS VISIT:  ED Discharge Orders     None           This chart was dictated using voice recognition software/Dragon. Despite best efforts to proofread, errors can occur which can change the meaning. Any change was purely unintentional.     Aijah, [place], PA-C 06/24/21 1540    Duffy Bruce, MD 06/24/21 2234

## 2021-06-25 ENCOUNTER — Emergency Department
Admission: EM | Admit: 2021-06-25 | Discharge: 2021-06-25 | Disposition: A | Discharge status: Home / Self Care | Payer: Medicare Other | Attending: Emergency Medicine | Admitting: Emergency Medicine

## 2021-06-25 ENCOUNTER — Other Ambulatory Visit: Payer: Self-pay

## 2021-06-25 DIAGNOSIS — Z7901 Long term (current) use of anticoagulants: Secondary | ICD-10-CM | POA: Insufficient documentation

## 2021-06-25 DIAGNOSIS — Z853 Personal history of malignant neoplasm of breast: Secondary | ICD-10-CM | POA: Diagnosis not present

## 2021-06-25 DIAGNOSIS — Z87891 Personal history of nicotine dependence: Secondary | ICD-10-CM | POA: Insufficient documentation

## 2021-06-25 DIAGNOSIS — E1122 Type 2 diabetes mellitus with diabetic chronic kidney disease: Secondary | ICD-10-CM | POA: Diagnosis not present

## 2021-06-25 DIAGNOSIS — N1831 Chronic kidney disease, stage 3a: Secondary | ICD-10-CM | POA: Insufficient documentation

## 2021-06-25 DIAGNOSIS — R519 Headache, unspecified: Secondary | ICD-10-CM | POA: Diagnosis present

## 2021-06-25 DIAGNOSIS — I129 Hypertensive chronic kidney disease with stage 1 through stage 4 chronic kidney disease, or unspecified chronic kidney disease: Secondary | ICD-10-CM | POA: Diagnosis not present

## 2021-06-25 DIAGNOSIS — Z7984 Long term (current) use of oral hypoglycemic drugs: Secondary | ICD-10-CM | POA: Diagnosis not present

## 2021-06-25 DIAGNOSIS — Z79899 Other long term (current) drug therapy: Secondary | ICD-10-CM | POA: Diagnosis not present

## 2021-06-25 DIAGNOSIS — Z85118 Personal history of other malignant neoplasm of bronchus and lung: Secondary | ICD-10-CM | POA: Insufficient documentation

## 2021-06-25 MED ORDER — ONDANSETRON 4 MG PO TBDP: 4.0000 mg | ORAL_TABLET | Freq: Three times a day (TID) | ORAL | 0 refills | Status: DC | PRN

## 2021-06-25 MED ORDER — ONDANSETRON 4 MG PO TBDP
4.0000 mg | ORAL_TABLET | Freq: Once | ORAL | Status: AC
  Administered 2021-06-25: 4 mg via ORAL
  Filled 2021-06-25: qty 1

## 2021-06-25 NOTE — Discharge Instructions (Signed)
Take your home [facility] powders along with the prescription nausea medicine and headache pain relief. Follow-up with your provider for continued symptoms.

## 2021-06-25 NOTE — ED Provider Notes (Signed)
[facility] Emergency Department Provider Note ____________________________________________  Time seen: 1623  I have reviewed the triage vital signs and the nursing notes.  HISTORY  Chief Complaint  Headache   HPI Tracy Dickerson is a 80 y.o. female to the ED with complaints of headache that she believes is related to recently received flu vaccine.  Patient was evaluated in the ED last night for similar complaints, and discharged with instructions to follow-up with primary provider.  She reports she picked up a box of [employer] powders, but has not taken it yet.  Denies any fevers, chills, chest pain, or shortness of breath.  Past Medical History:  Diagnosis Date   Cancer ([place])    Breast and lung   Diabetes mellitus without complication ([place])    GERD (gastroesophageal reflux disease)    Heart disease    Hypertension     Patient Active Problem List   Diagnosis Date Noted   Constipation    UTI (urinary tract infection) 08/13/2020   HLD (hyperlipidemia) 08/13/2020   Acute renal failure superimposed on stage 3a chronic kidney disease ([place]) 08/13/2020   PAF (paroxysmal atrial fibrillation) ([place]) 08/13/2020   HTN (hypertension) 08/13/2020   Dehydration    Dehydration, moderate    Acute renal failure ([place]) 04/22/2020   Hyperkalemia    Septic shock (HCC)    Sepsis due to urinary tract infection ([place]) 03/13/2018   Acute kidney injury superimposed on chronic kidney disease ([place]) 02/13/2018   Closed extra-articular fracture of distal tibia 02/02/2018   Pneumonia 10/29/2017   Hypotension 01/26/2017    Past Surgical History:  Procedure Laterality Date   ABDOMINAL HYSTERECTOMY     APPENDECTOMY     BREAST SURGERY     breast cancer LEFT   OPEN REDUCTION INTERNAL FIXATION (ORIF) TIBIA/FIBULA FRACTURE Left 02/03/2018   Procedure: OPEN REDUCTION INTERNAL FIXATION (ORIF) DISTAL TIBIA FRACTURE;  Surgeon: Corky Mull, MD;  Location: [facility] ORS;  Service: Orthopedics;   Laterality: Left;  ankle/lower leg   TONSILLECTOMY      Prior to Admission medications   Medication Sig Start Date End Date Taking? Authorizing Provider  ondansetron (ZOFRAN ODT) 4 MG disintegrating tablet Take 1 tablet (4 mg total) by mouth every 8 (eight) hours as needed. 06/25/21  Yes Earnestine Tuohey, Dannielle Karvonen, PA-C  acetaminophen (TYLENOL) 500 MG tablet Take 500 mg by mouth every 6 (six) hours as needed.    [provider]  albuterol (PROAIR HFA) 108 (90 Base) MCG/ACT inhaler Inhale 2 puffs into the lungs every 4 (four) hours as needed for wheezing or shortness of breath.     [provider]  apixaban (ELIQUIS) 5 MG TABS tablet Take 5 mg by mouth 2 (two) times daily. 06/17/18   [provider]  atorvastatin (LIPITOR) 20 MG tablet Take by mouth. 05/01/20   [provider]  butalbital-acetaminophen-caffeine (FIORICET) 50-325-40 MG tablet Take 1 tablet by mouth every 6 (six) hours as needed for headache. 02/16/21 02/16/22  [name], [name], PA-C  dicyclomine (BENTYL) 10 MG capsule Take 1 capsule (10 mg total) by mouth 4 (four) times daily for 14 days. 05/24/21 06/07/21  [name], [name], PA-C  diphenhydramine-acetaminophen (TYLENOL PM) 25-500 MG TABS tablet Take 1-2 tablets by mouth at bedtime as needed (pain or sleep).     [provider]  gabapentin (NEURONTIN) 300 MG capsule Take 2 caspules by mouth at bedtime    [provider]  lisinopril (ZESTRIL) 10 MG tablet Take 10 mg  by mouth daily. 06/28/20   [provider]  loperamide (IMODIUM A-D) 2 MG tablet Take 1 tablet (2 mg total) by mouth 4 (four) times daily as needed for diarrhea or loose stools. 05/24/21   [name], [name], PA-C  magic mouthwash SOLN Take 5 mLs by mouth 4 (four) times daily. Swish and swallow 09/11/20   [name], PA-C  metFORMIN (GLUCOPHAGE) 1000 MG tablet Take 1,000 mg by mouth 2 (two) times daily with a meal.    [provider]   neomycin-polymyxin-pramoxine (NEOSPORIN PLUS) 1 % cream Apply topically 2 (two) times daily. 05/27/20   Sable Feil, PA-C  polyethylene glycol (MIRALAX / GLYCOLAX) 17 g packet Take 17 g by mouth daily. 08/16/20   [name], MD  senna-docusate (SENOKOT-S) 8.6-50 MG tablet Take 2 tablets by mouth at bedtime as needed for mild constipation. 08/15/20   [name], MD  SPIRIVA HANDIHALER 18 MCG inhalation capsule Place 1 capsule (18 mcg total) into inhaler and inhale daily. 04/23/20   [name], MD  traMADol (ULTRAM) 50 MG tablet Take 50 mg by mouth 2 (two) times daily as needed.     [provider]    Allergies Oxycodone, Percocet [oxycodone-acetaminophen], and Penicillins  Family History  Problem Relation Age of Onset   Cancer Mother     Social History Social History   Tobacco Use   Smoking status: Former   Smokeless tobacco: Never  Scientific laboratory technician Use: Never used  Substance Use Topics   Alcohol use: Not Currently   Drug use: No    Review of Systems  Constitutional: Negative for fever. Eyes: Negative for visual changes. ENT: Negative for sore throat. Cardiovascular: Negative for chest pain. Respiratory: Negative for shortness of breath. Gastrointestinal: Negative for abdominal pain, vomiting and diarrhea. Genitourinary: Negative for dysuria. Musculoskeletal: Negative for back pain. Skin: Negative for rash. Neurological: Positive for headaches. Denies focal weakness or numbness. ____________________________________________  PHYSICAL EXAM:  VITAL SIGNS: ED Triage Vitals  Enc Vitals Group     BP 06/25/21 1602 (!) 130/95     Pulse Rate 06/25/21 1602 100     Resp 06/25/21 1602 17     Temp 06/25/21 1602 98.3 F (36.8 C)     Temp src --      SpO2 06/25/21 1602 95 %     Weight --      Height --      Head Circumference --      Peak Flow --      Pain Score 06/25/21 1600 5     Pain Loc --      Pain Edu? --      Excl. in [place]? --     Constitutional:  Alert and oriented. Well appearing and in no distress. Head: Normocephalic and atraumatic. Eyes: Conjunctivae are normal. PERRL. Normal extraocular movements Neck: Supple. No thyromegaly. Cardiovascular: Normal rate, regular rhythm. Normal distal pulses. Respiratory: Normal respiratory effort. No wheezes/rales/rhonchi. Gastrointestinal: Soft and nontender. No distention. Musculoskeletal: Nontender with normal range of motion in all extremities.  Neurologic: Cranial nerves II through XII grossly intact.  Normal gait without ataxia. Normal speech and language. No gross focal neurologic deficits are appreciated. Skin:  Skin is warm, dry and intact. No rash noted. Psychiatric: Mood and affect are normal. Patient exhibits appropriate insight and judgment. ____________________________________________    {LABS (pertinent positives/negatives)  ____________________________________________  {EKG  ____________________________________________   RADIOLOGY Official radiology report(s): No results found. ____________________________________________  PROCEDURES  Zofran  4 mg ODT  Procedures ____________________________________________   INITIAL IMPRESSION / ASSESSMENT AND PLAN / ED COURSE  As part of my medical decision making, I reviewed the following data within the [place] Notes from prior ED visits   Patient ED evaluation of mild headache complaint without visual disturbance, or acute neurologic deficit.  Patient with a normal reassuring exam at this time.  She is stable for discharge, will be prescribed Zofran to help with additional headache pain relief.  She will follow with primary provider return to the ED if needed.   Tracy Dickerson was evaluated in Emergency Department on 06/25/2021 for the symptoms described in the history of present illness. She was evaluated in the context of the global COVID-19 pandemic, which necessitated consideration that the patient might  be at risk for infection with the SARS-CoV-2 virus that causes COVID-19. Institutional protocols and algorithms that pertain to the evaluation of patients at risk for COVID-19 are in a state of rapid change based on information released by regulatory bodies including the CDC and federal and state organizations. These policies and algorithms were followed during the patient's care in the ED.  ____________________________________________  FINAL CLINICAL IMPRESSION(S) / ED DIAGNOSES  Final diagnoses:  Bad headache      Johndaniel Catlin, Dannielle Karvonen, PA-C 06/25/21 1708    Lucrezia Starch, MD 06/25/21 Bosie Helper

## 2021-06-25 NOTE — ED Triage Notes (Addendum)
Pt got flu shot yesterday and now has cough and headache. Pt states pain to left arm. Pt was seen here in ED yesterday for similar symptoms.

## 2021-06-25 NOTE — ED Provider Notes (Signed)
Emergency Medicine Provider Triage Evaluation Note  Tracy Dickerson , a 80 y.o. female  was evaluated in triage.  Pt complains of Presents to the ED with complaints of mild headache.  Patient reports she got her flu shot 2 days ago, and has now developed cough and headache.  She denies any fevers, chills, sweats, chest pain, or shortness of breath.  Patient was evaluated in the ED yesterday for similar complaints.  Review of Systems  Positive: headache Negative: CP, SOB  Physical Exam  BP (!) 130/95   Pulse 100   Temp 98.3 F (36.8 C)   Resp 17   SpO2 95%  Gen:   Awake, no distress  NAD Resp:  Normal effort CTA MSK:   Moves extremities without difficulty  Other:  CVS: RRR  Medical Decision Making  Medically screening exam initiated at 4:06 PM.  Appropriate orders placed.  Tracy Dickerson was informed that the remainder of the evaluation will be completed by another provider, this initial triage assessment does not replace that evaluation, and the importance of remaining in the ED until their evaluation is complete.  Patient with ED evaluation of mild cough and headache, citing recent flu vaccine 3 days prior.   Melvenia Needles, PA-C 06/25/21 1607    Rada Hay, MD 06/25/21 1943

## 2021-06-27 ENCOUNTER — Encounter: Payer: Self-pay | Admitting: Emergency Medicine

## 2021-06-27 ENCOUNTER — Emergency Department
Admission: EM | Admit: 2021-06-27 | Discharge: 2021-06-27 | Disposition: A | Discharge status: Home / Self Care | Payer: Medicare Other | Attending: Emergency Medicine | Admitting: Emergency Medicine

## 2021-06-27 ENCOUNTER — Emergency Department: Payer: Medicare Other

## 2021-06-27 ENCOUNTER — Other Ambulatory Visit: Payer: Self-pay

## 2021-06-27 DIAGNOSIS — N1831 Chronic kidney disease, stage 3a: Secondary | ICD-10-CM | POA: Diagnosis not present

## 2021-06-27 DIAGNOSIS — E1122 Type 2 diabetes mellitus with diabetic chronic kidney disease: Secondary | ICD-10-CM | POA: Insufficient documentation

## 2021-06-27 DIAGNOSIS — I129 Hypertensive chronic kidney disease with stage 1 through stage 4 chronic kidney disease, or unspecified chronic kidney disease: Secondary | ICD-10-CM | POA: Insufficient documentation

## 2021-06-27 DIAGNOSIS — Z87891 Personal history of nicotine dependence: Secondary | ICD-10-CM | POA: Insufficient documentation

## 2021-06-27 DIAGNOSIS — R519 Headache, unspecified: Secondary | ICD-10-CM | POA: Insufficient documentation

## 2021-06-27 DIAGNOSIS — Z85118 Personal history of other malignant neoplasm of bronchus and lung: Secondary | ICD-10-CM | POA: Insufficient documentation

## 2021-06-27 DIAGNOSIS — Z79899 Other long term (current) drug therapy: Secondary | ICD-10-CM | POA: Insufficient documentation

## 2021-06-27 DIAGNOSIS — Z7984 Long term (current) use of oral hypoglycemic drugs: Secondary | ICD-10-CM | POA: Insufficient documentation

## 2021-06-27 DIAGNOSIS — Z7901 Long term (current) use of anticoagulants: Secondary | ICD-10-CM | POA: Diagnosis not present

## 2021-06-27 DIAGNOSIS — Z853 Personal history of malignant neoplasm of breast: Secondary | ICD-10-CM | POA: Diagnosis not present

## 2021-06-27 DIAGNOSIS — N3000 Acute cystitis without hematuria: Secondary | ICD-10-CM | POA: Insufficient documentation

## 2021-06-27 LAB — COMPREHENSIVE METABOLIC PANEL
ALT: 11 U/L (ref 0–44)
AST: 18 U/L (ref 15–41)
Albumin: 3.8 g/dL (ref 3.5–5.0)
Alkaline Phosphatase: 70 U/L (ref 38–126)
Anion gap: 7 (ref 5–15)
BUN: 19 mg/dL (ref 8–23)
CO2: 28 mmol/L (ref 22–32)
Calcium: 9 mg/dL (ref 8.9–10.3)
Chloride: 105 mmol/L (ref 98–111)
Creatinine, Ser: 1.02 mg/dL — ABNORMAL HIGH (ref 0.44–1.00)
GFR, Estimated: 57 mL/min — ABNORMAL LOW (ref >60)
Glucose, Bld: 93 mg/dL (ref 70–99)
Potassium: 4.1 mmol/L (ref 3.5–5.1)
Sodium: 140 mmol/L (ref 135–145)
Total Bilirubin: 0.5 mg/dL (ref 0.3–1.2)
Total Protein: 7 g/dL (ref 6.5–8.1)

## 2021-06-27 LAB — CBC WITH DIFFERENTIAL/PLATELET
Abs Immature Granulocytes: 0.03 10*3/uL (ref 0.00–0.07)
Basophils Absolute: 0 10*3/uL (ref 0.0–0.1)
Basophils Relative: 1 %
Eosinophils Absolute: 0.3 10*3/uL (ref 0.0–0.5)
Eosinophils Relative: 3 %
HCT: 38.1 % (ref 36.0–46.0)
Hemoglobin: 12.9 g/dL (ref 12.0–15.0)
Immature Granulocytes: 0 %
Lymphocytes Relative: 28 %
Lymphs Abs: 2.4 10*3/uL (ref 0.7–4.0)
MCH: 31.4 pg (ref 26.0–34.0)
MCHC: 33.9 g/dL (ref 30.0–36.0)
MCV: 92.7 fL (ref 80.0–100.0)
Monocytes Absolute: 0.6 10*3/uL (ref 0.1–1.0)
Monocytes Relative: 7 %
Neutro Abs: 5.2 10*3/uL (ref 1.7–7.7)
Neutrophils Relative %: 61 %
Platelets: 176 10*3/uL (ref 150–400)
RBC: 4.11 MIL/uL (ref 3.87–5.11)
RDW: 13.4 % (ref 11.5–15.5)
WBC: 8.5 10*3/uL (ref 4.0–10.5)
nRBC: 0 % (ref 0.0–0.2)

## 2021-06-27 LAB — URINALYSIS, COMPLETE (UACMP) WITH MICROSCOPIC
Bilirubin Urine: NEGATIVE
Glucose, UA: NEGATIVE mg/dL
Hgb urine dipstick: NEGATIVE
Ketones, ur: NEGATIVE mg/dL
Nitrite: NEGATIVE
Protein, ur: 100 mg/dL — AB
Specific Gravity, Urine: 1.02 (ref 1.005–1.030)
WBC, UA: >50 WBC/hpf — ABNORMAL HIGH (ref 0–5)
pH: 7 (ref 5.0–8.0)

## 2021-06-27 MED ORDER — CEPHALEXIN 500 MG PO CAPS: 500.0000 mg | ORAL_CAPSULE | Freq: Four times a day (QID) | ORAL | 0 refills | Status: DC

## 2021-06-27 MED ORDER — CEPHALEXIN 500 MG PO CAPS: 500.0000 mg | ORAL_CAPSULE | Freq: Four times a day (QID) | ORAL | 0 refills | Status: AC

## 2021-06-27 NOTE — Discharge Instructions (Addendum)
Take Keflex four times daily for the next seven days.

## 2021-06-27 NOTE — ED Provider Notes (Signed)
[facility]-EMERGENCY DEPARTMENT  ____________________________________________  Time seen: Approximately 6:05 PM  I have reviewed the triage vital signs and the nursing notes.   HISTORY  Chief Complaint No chief complaint on file.   Historian Patient     HPI Tracy Dickerson is a 80 y.o. female presents to the emergency department with a headache for the past 30 minutes and dizziness that is occurred for the past 2 days.  Patient states that she has had chronic left leg pain for the past 3 years since she had a fracture.  Patient reports that she has been seen for similar symptoms in the past and was told to take Tylenol and ibuprofen although tramadol works better for her.  She denies chest pain, chest tightness or abdominal pain but is concerned that she feels unsteady on her feet.   Past Medical History:  Diagnosis Date   Cancer ([place])    Breast and lung   Diabetes mellitus without complication ([place])    GERD (gastroesophageal reflux disease)    Heart disease    Hypertension      Immunizations up to date:  Yes.     Past Medical History:  Diagnosis Date   Cancer ([place])    Breast and lung   Diabetes mellitus without complication ([place])    GERD (gastroesophageal reflux disease)    Heart disease    Hypertension     Patient Active Problem List   Diagnosis Date Noted   Constipation    UTI (urinary tract infection) 08/13/2020   HLD (hyperlipidemia) 08/13/2020   Acute renal failure superimposed on stage 3a chronic kidney disease ([place]) 08/13/2020   PAF (paroxysmal atrial fibrillation) ([place]) 08/13/2020   HTN (hypertension) 08/13/2020   Dehydration    Dehydration, moderate    Acute renal failure ([place]) 04/22/2020   Hyperkalemia    Septic shock (HCC)    Sepsis due to urinary tract infection ([place]) 03/13/2018   Acute kidney injury superimposed on chronic kidney disease ([place]) 02/13/2018   Closed extra-articular fracture of distal tibia 02/02/2018   Pneumonia 10/29/2017    Hypotension 01/26/2017    Past Surgical History:  Procedure Laterality Date   ABDOMINAL HYSTERECTOMY     APPENDECTOMY     BREAST SURGERY     breast cancer LEFT   OPEN REDUCTION INTERNAL FIXATION (ORIF) TIBIA/FIBULA FRACTURE Left 02/03/2018   Procedure: OPEN REDUCTION INTERNAL FIXATION (ORIF) DISTAL TIBIA FRACTURE;  Surgeon: Corky Mull, MD;  Location: [facility] ORS;  Service: Orthopedics;  Laterality: Left;  ankle/lower leg   TONSILLECTOMY      Prior to Admission medications   Medication Sig Start Date End Date Taking? Authorizing Provider  acetaminophen (TYLENOL) 500 MG tablet Take 500 mg by mouth every 6 (six) hours as needed.    [provider]  albuterol (PROAIR HFA) 108 (90 Base) MCG/ACT inhaler Inhale 2 puffs into the lungs every 4 (four) hours as needed for wheezing or shortness of breath.     [provider]  apixaban (ELIQUIS) 5 MG TABS tablet Take 5 mg by mouth 2 (two) times daily. 06/17/18   [provider]  atorvastatin (LIPITOR) 20 MG tablet Take by mouth. 05/01/20   [provider]  butalbital-acetaminophen-caffeine (FIORICET) 50-325-40 MG tablet Take 1 tablet by mouth every 6 (six) hours as needed for headache. 02/16/21 02/16/22  [name], [name], PA-C  cephALEXin (KEFLEX) 500 MG capsule Take 1 capsule (500 mg total) by mouth 4 (four) times daily for 7 days. 06/27/21 07/04/21  Sherral Hammers,  Conley Rolls, PA-C  dicyclomine (BENTYL) 10 MG capsule Take 1 capsule (10 mg total) by mouth 4 (four) times daily for 14 days. 05/24/21 06/07/21  [name], [name], PA-C  diphenhydramine-acetaminophen (TYLENOL PM) 25-500 MG TABS tablet Take 1-2 tablets by mouth at bedtime as needed (pain or sleep).     [provider]  gabapentin (NEURONTIN) 300 MG capsule Take 2 caspules by mouth at bedtime    [provider]  lisinopril (ZESTRIL) 10 MG tablet Take 10 mg by mouth daily. 06/28/20   [provider]  loperamide (IMODIUM A-D) 2 MG tablet Take 1  tablet (2 mg total) by mouth 4 (four) times daily as needed for diarrhea or loose stools. 05/24/21   [name], [name], PA-C  magic mouthwash SOLN Take 5 mLs by mouth 4 (four) times daily. Swish and swallow 09/11/20   [name], PA-C  metFORMIN (GLUCOPHAGE) 1000 MG tablet Take 1,000 mg by mouth 2 (two) times daily with a meal.    [provider]  neomycin-polymyxin-pramoxine (NEOSPORIN PLUS) 1 % cream Apply topically 2 (two) times daily. 05/27/20   Sable Feil, PA-C  ondansetron (ZOFRAN ODT) 4 MG disintegrating tablet Take 1 tablet (4 mg total) by mouth every 8 (eight) hours as needed. 06/25/21   Menshew, Dannielle Karvonen, PA-C  polyethylene glycol (MIRALAX / GLYCOLAX) 17 g packet Take 17 g by mouth daily. 08/16/20   [name], MD  senna-docusate (SENOKOT-S) 8.6-50 MG tablet Take 2 tablets by mouth at bedtime as needed for mild constipation. 08/15/20   [name], MD  SPIRIVA HANDIHALER 18 MCG inhalation capsule Place 1 capsule (18 mcg total) into inhaler and inhale daily. 04/23/20   [name], MD  traMADol (ULTRAM) 50 MG tablet Take 50 mg by mouth 2 (two) times daily as needed.     [provider]    Allergies Oxycodone, Percocet [oxycodone-acetaminophen], and Penicillins  Family History  Problem Relation Age of Onset   Cancer Mother     Social History Social History   Tobacco Use   Smoking status: Former   Smokeless tobacco: Never  Scientific laboratory technician Use: Never used  Substance Use Topics   Alcohol use: Not Currently   Drug use: No     Review of Systems  Constitutional: No fever/chills Eyes:  No discharge ENT: No upper respiratory complaints. Respiratory: no cough. No SOB/ use of accessory muscles to breath Gastrointestinal:   No nausea, no vomiting.  No diarrhea.  No constipation. Musculoskeletal: Patient has left leg pain.  Neuro: Patient has headache.  Skin: Negative for rash, abrasions, lacerations,  ecchymosis. ____________________________________________   PHYSICAL EXAM:  VITAL SIGNS: ED Triage Vitals [06/27/21 1539]  Enc Vitals Group     BP 129/69     Pulse Rate 91     Resp 18     Temp 98 F (36.7 C)     Temp Source Oral     SpO2 94 %     Weight 208 lb 15.9 oz (94.8 kg)     Height 5\' 6"  (1.676 m)     Head Circumference      Peak Flow      Pain Score 10     Pain Loc      Pain Edu?      Excl. in [place]?      Constitutional: Alert and oriented. Well appearing and in no acute distress. Eyes: Conjunctivae are normal. PERRL. EOMI. Head: Atraumatic. ENT:  Nose: No congestion/rhinnorhea.      Mouth/Throat: Mucous membranes are moist.  Neck: No stridor.  No cervical spine tenderness to palpation. Cardiovascular: Normal rate, regular rhythm. Normal S1 and S2.  Good peripheral circulation. Respiratory: Normal respiratory effort without tachypnea or retractions. Lungs CTAB. Good air entry to the bases with no decreased or absent breath sounds Gastrointestinal: Bowel sounds x 4 quadrants. Soft and nontender to palpation. No guarding or rigidity. No distention. Musculoskeletal: Full range of motion to all extremities. No obvious deformities noted Neurologic:  Normal for age. No gross focal neurologic deficits are appreciated.  Skin:  Skin is warm, dry and intact. No rash noted. Psychiatric: Mood and affect are normal for age. Speech and behavior are normal.   ____________________________________________   LABS (all labs ordered are listed, but only abnormal results are displayed)  Labs Reviewed  COMPREHENSIVE METABOLIC PANEL - Abnormal; Notable for the following components:      Result Value   Creatinine, Ser 1.02 (*)    GFR, Estimated 57 (*)    All other components within normal limits  URINALYSIS, COMPLETE (UACMP) WITH MICROSCOPIC - Abnormal; Notable for the following components:   Color, Urine AMBER (*)    APPearance CLOUDY (*)    Protein, ur 100 (*)     Leukocytes,Ua LARGE (*)    WBC, UA >50 (*)    Bacteria, UA MANY (*)    Non Squamous Epithelial PRESENT (*)    All other components within normal limits  CBC WITH DIFFERENTIAL/PLATELET   ____________________________________________  EKG   ____________________________________________  RADIOLOGY Unk Pinto, personally viewed and evaluated these images (plain radiographs) as part of my medical decision making, as well as reviewing the written report by the radiologist.    CT Head Wo Contrast  Result Date: 06/27/2021 CLINICAL DATA:  Headache EXAM: CT HEAD WITHOUT CONTRAST TECHNIQUE: Contiguous axial images were obtained from the base of the skull through the vertex without intravenous contrast. COMPARISON:  MRI 02/16/2021, CT brain 02/15/2021 FINDINGS: Brain: No acute territorial infarction, hemorrhage or intracranial mass. Atrophy mild chronic small vessel ischemic changes of the white matter. Stable ventricle size Vascular: No hyperdense vessels.  Carotid vascular calcification Skull: Normal. Negative for fracture or focal lesion. Sinuses/Orbits: No acute finding. Other: None IMPRESSION: 1. No CT evidence for acute intracranial abnormality. 2. Atrophy chronic small vessel ischemic changes of the white matter Electronically Signed   By: Donavan Foil M.D.   On: 06/27/2021 18:40    ____________________________________________    PROCEDURES  Procedure(s) performed:     Procedures     Medications - No data to display   ____________________________________________   INITIAL IMPRESSION / ASSESSMENT AND PLAN / ED COURSE  Pertinent labs & imaging results that were available during my care of the patient were reviewed by me and considered in my medical decision making (see chart for details).      Assessment and Plan: Headache:  Dizziness:  80 year old female well-known to this emergency department presents to the emergency department with dizziness and persistent  headache.  Vital signs are reassuring in triage.  CBC and CMP were reassuring.  Urinalysis was concerning for UTI.  No evidence of intracranial bleed on head CT.  We will treat with Keflex 4 times daily for the next 7 days.  Return precautions were given to return with new or worsening symptoms.  ____________________________________________  FINAL CLINICAL IMPRESSION(S) / ED DIAGNOSES  Final diagnoses:  Acute cystitis without hematuria  NEW MEDICATIONS STARTED DURING THIS VISIT:  ED Discharge Orders          Ordered    cephALEXin (KEFLEX) 500 MG capsule  4 times daily,   Status:  Discontinued        06/27/21 1956    cephALEXin (KEFLEX) 500 MG capsule  4 times daily        06/27/21 2013                This chart was dictated using voice recognition software/Dragon. Despite best efforts to proofread, errors can occur which can change the meaning. Any change was purely unintentional.     Giovanni, [place], PA-C 06/27/21 2153    Naaman Plummer, MD 07/03/21 2100

## 2021-06-27 NOTE — ED Notes (Signed)
Unable to obtain ordered labs. Lab called and asked to send phlebotomy tech.

## 2021-06-27 NOTE — ED Triage Notes (Signed)
Patient to ED via EMS from home for c/o headache x 30 minutes. Patient states she did not take anything for it. Patient c/o leg pain x 3 years. Patient denies hx of migraines.

## 2021-07-04 ENCOUNTER — Other Ambulatory Visit: Payer: Self-pay

## 2021-07-04 ENCOUNTER — Emergency Department
Admission: EM | Admit: 2021-07-04 | Discharge: 2021-07-04 | Disposition: A | Discharge status: Home / Self Care | Payer: Medicare Other | Attending: Emergency Medicine | Admitting: Emergency Medicine

## 2021-07-04 DIAGNOSIS — K219 Gastro-esophageal reflux disease without esophagitis: Secondary | ICD-10-CM | POA: Diagnosis not present

## 2021-07-04 DIAGNOSIS — Z79899 Other long term (current) drug therapy: Secondary | ICD-10-CM | POA: Insufficient documentation

## 2021-07-04 DIAGNOSIS — E1122 Type 2 diabetes mellitus with diabetic chronic kidney disease: Secondary | ICD-10-CM | POA: Diagnosis not present

## 2021-07-04 DIAGNOSIS — Z7901 Long term (current) use of anticoagulants: Secondary | ICD-10-CM | POA: Insufficient documentation

## 2021-07-04 DIAGNOSIS — Z85118 Personal history of other malignant neoplasm of bronchus and lung: Secondary | ICD-10-CM | POA: Diagnosis not present

## 2021-07-04 DIAGNOSIS — I129 Hypertensive chronic kidney disease with stage 1 through stage 4 chronic kidney disease, or unspecified chronic kidney disease: Secondary | ICD-10-CM | POA: Insufficient documentation

## 2021-07-04 DIAGNOSIS — E860 Dehydration: Secondary | ICD-10-CM | POA: Diagnosis not present

## 2021-07-04 DIAGNOSIS — Z853 Personal history of malignant neoplasm of breast: Secondary | ICD-10-CM | POA: Insufficient documentation

## 2021-07-04 DIAGNOSIS — Z7984 Long term (current) use of oral hypoglycemic drugs: Secondary | ICD-10-CM | POA: Diagnosis not present

## 2021-07-04 DIAGNOSIS — M79662 Pain in left lower leg: Secondary | ICD-10-CM | POA: Diagnosis not present

## 2021-07-04 DIAGNOSIS — R42 Dizziness and giddiness: Secondary | ICD-10-CM | POA: Diagnosis present

## 2021-07-04 DIAGNOSIS — N3000 Acute cystitis without hematuria: Secondary | ICD-10-CM | POA: Diagnosis not present

## 2021-07-04 DIAGNOSIS — G8929 Other chronic pain: Secondary | ICD-10-CM | POA: Diagnosis not present

## 2021-07-04 DIAGNOSIS — N1831 Chronic kidney disease, stage 3a: Secondary | ICD-10-CM | POA: Insufficient documentation

## 2021-07-04 DIAGNOSIS — Z87891 Personal history of nicotine dependence: Secondary | ICD-10-CM | POA: Diagnosis not present

## 2021-07-04 DIAGNOSIS — N3090 Cystitis, unspecified without hematuria: Secondary | ICD-10-CM

## 2021-07-04 DIAGNOSIS — I480 Paroxysmal atrial fibrillation: Secondary | ICD-10-CM | POA: Diagnosis not present

## 2021-07-04 DIAGNOSIS — E119 Type 2 diabetes mellitus without complications: Secondary | ICD-10-CM

## 2021-07-04 LAB — URINALYSIS, COMPLETE (UACMP) WITH MICROSCOPIC
Bilirubin Urine: NEGATIVE
Glucose, UA: NEGATIVE mg/dL
Hgb urine dipstick: NEGATIVE
Ketones, ur: NEGATIVE mg/dL
Nitrite: NEGATIVE
Protein, ur: 100 mg/dL — AB
Specific Gravity, Urine: 1.02 (ref 1.005–1.030)
WBC, UA: >50 WBC/hpf — ABNORMAL HIGH (ref 0–5)
pH: 7 (ref 5.0–8.0)

## 2021-07-04 LAB — CBG MONITORING, ED: Glucose-Capillary: 83 mg/dL (ref 70–99)

## 2021-07-04 MED ORDER — CEFDINIR 300 MG PO CAPS: 300.0000 mg | ORAL_CAPSULE | Freq: Two times a day (BID) | ORAL | 0 refills | Status: DC

## 2021-07-04 NOTE — Discharge Instructions (Addendum)
Your urine test shows persistent signs of a bladder infection.  Please pick up the [facility] prescription and take it as directed over the next week.  Follow-up with your primary care doctor or urologist in 1 week for recheck of your urine and symptoms to ensure that the infection has resolved.

## 2021-07-04 NOTE — ED Notes (Signed)
Pt provided a phone to call for a ride home.

## 2021-07-04 NOTE — ED Provider Notes (Signed)
[facility] Emergency Department Provider Note  ____________________________________________  Time seen: Approximately 6:23 PM  I have reviewed the triage vital signs and the nursing notes.   HISTORY  Chief Complaint Dizziness   HPI Tracy Dickerson is a 80 y.o. female with a history of diabetes GERD hypertension and recent UTI, diagnosed a week ago and treated with a week of Keflex who comes ED complaining that she felt dizzy off and on while shopping today at the grocery and [place].  Denies any chest pain shortness of breath abdominal pain vomiting or diarrhea.  Reports that she took the Keflex as prescribed.  Denies dysuria frequency or urgency.  Reports that she has not eaten today.  When she woke up she had 2 cups of coffee and has not had any other liquids to drink.  No vision changes, headache, paresthesia or motor weakness.    Past Medical History:  Diagnosis Date   Cancer ([place])    Breast and lung   Diabetes mellitus without complication ([place])    GERD (gastroesophageal reflux disease)    Heart disease    Hypertension      Patient Active Problem List   Diagnosis Date Noted   Constipation    UTI (urinary tract infection) 08/13/2020   HLD (hyperlipidemia) 08/13/2020   Acute renal failure superimposed on stage 3a chronic kidney disease ([place]) 08/13/2020   PAF (paroxysmal atrial fibrillation) ([place]) 08/13/2020   HTN (hypertension) 08/13/2020   Dehydration    Dehydration, moderate    Acute renal failure ([place]) 04/22/2020   Hyperkalemia    Septic shock (HCC)    Sepsis due to urinary tract infection ([place]) 03/13/2018   Acute kidney injury superimposed on chronic kidney disease ([place]) 02/13/2018   Closed extra-articular fracture of distal tibia 02/02/2018   Pneumonia 10/29/2017   Hypotension 01/26/2017     Past Surgical History:  Procedure Laterality Date   ABDOMINAL HYSTERECTOMY     APPENDECTOMY     BREAST SURGERY     breast  cancer LEFT   OPEN REDUCTION INTERNAL FIXATION (ORIF) TIBIA/FIBULA FRACTURE Left 02/03/2018   Procedure: OPEN REDUCTION INTERNAL FIXATION (ORIF) DISTAL TIBIA FRACTURE;  Surgeon: Corky Mull, MD;  Location: [facility] ORS;  Service: Orthopedics;  Laterality: Left;  ankle/lower leg   TONSILLECTOMY       Prior to Admission medications   Medication Sig Start Date End Date Taking? Authorizing Provider  cefdinir (OMNICEF) 300 MG capsule Take 1 capsule (300 mg total) by mouth 2 (two) times daily. 07/04/21  Yes Carrie Mew, MD  acetaminophen (TYLENOL) 500 MG tablet Take 500 mg by mouth every 6 (six) hours as needed.    [provider]  albuterol (PROAIR HFA) 108 (90 Base) MCG/ACT inhaler Inhale 2 puffs into the lungs every 4 (four) hours as needed for wheezing or shortness of breath.     [provider]  apixaban (ELIQUIS) 5 MG TABS tablet Take 5 mg by mouth 2 (two) times daily. 06/17/18   [provider]  atorvastatin (LIPITOR) 20 MG tablet Take by mouth. 05/01/20   [provider]  butalbital-acetaminophen-caffeine (FIORICET) 50-325-40 MG tablet Take 1 tablet by mouth every 6 (six) hours as needed for headache. 02/16/21 02/16/22  [name], [name], PA-C  cephALEXin (KEFLEX) 500 MG capsule Take 1 capsule (500 mg total) by mouth 4 (four) times daily for 7 days. 06/27/21 07/04/21  Lannie Fields, PA-C  dicyclomine (BENTYL) 10 MG capsule Take 1 capsule (10 mg total) by  mouth 4 (four) times daily for 14 days. 05/24/21 06/07/21  [name], [name], PA-C  diphenhydramine-acetaminophen (TYLENOL PM) 25-500 MG TABS tablet Take 1-2 tablets by mouth at bedtime as needed (pain or sleep).     [provider]  gabapentin (NEURONTIN) 300 MG capsule Take 2 caspules by mouth at bedtime    [provider]  lisinopril (ZESTRIL) 10 MG tablet Take 10 mg by mouth daily. 06/28/20   [provider]  loperamide (IMODIUM A-D) 2 MG tablet Take 1 tablet (2 mg total) by  mouth 4 (four) times daily as needed for diarrhea or loose stools. 05/24/21   [name], [name], PA-C  magic mouthwash SOLN Take 5 mLs by mouth 4 (four) times daily. Swish and swallow 09/11/20   [name], PA-C  metFORMIN (GLUCOPHAGE) 1000 MG tablet Take 1,000 mg by mouth 2 (two) times daily with a meal.    [provider]  neomycin-polymyxin-pramoxine (NEOSPORIN PLUS) 1 % cream Apply topically 2 (two) times daily. 05/27/20   Sable Feil, PA-C  ondansetron (ZOFRAN ODT) 4 MG disintegrating tablet Take 1 tablet (4 mg total) by mouth every 8 (eight) hours as needed. 06/25/21   Menshew, Dannielle Karvonen, PA-C  polyethylene glycol (MIRALAX / GLYCOLAX) 17 g packet Take 17 g by mouth daily. 08/16/20   [name], MD  senna-docusate (SENOKOT-S) 8.6-50 MG tablet Take 2 tablets by mouth at bedtime as needed for mild constipation. 08/15/20   [name], MD  SPIRIVA HANDIHALER 18 MCG inhalation capsule Place 1 capsule (18 mcg total) into inhaler and inhale daily. 04/23/20   [name], MD  traMADol (ULTRAM) 50 MG tablet Take 50 mg by mouth 2 (two) times daily as needed.     [provider]     Allergies Oxycodone, Percocet [oxycodone-acetaminophen], and Penicillins   Family History  Problem Relation Age of Onset   Cancer Mother     Social History Social History   Tobacco Use   Smoking status: Former   Smokeless tobacco: Never  Scientific laboratory technician Use: Never used  Substance Use Topics   Alcohol use: Not Currently   Drug use: No    Review of Systems  Constitutional:   No fever or chills.  ENT:   No sore throat. No rhinorrhea. Cardiovascular:   No chest pain or syncope. Respiratory:   No dyspnea or cough. Gastrointestinal:   Negative for abdominal pain, vomiting and diarrhea.  Musculoskeletal:   Negative for focal pain or swelling All other systems reviewed and are negative except as documented above in ROS and  HPI.  ____________________________________________   PHYSICAL EXAM:  VITAL SIGNS: ED Triage Vitals  Enc Vitals Group     BP 07/04/21 1526 103/73     Pulse Rate 07/04/21 1526 88     Resp 07/04/21 1526 18     Temp 07/04/21 1526 98 F (36.7 C)     Temp Source 07/04/21 1526 Oral     SpO2 07/04/21 1526 96 %     Weight 07/04/21 1527 219 lb (99.3 kg)     Height 07/04/21 1527 5\' 6"  (1.676 m)     Head Circumference --      Peak Flow --      Pain Score 07/04/21 1527 10     Pain Loc --      Pain Edu? --      Excl. in [place]? --     Vital signs reviewed, nursing assessments reviewed.  Constitutional:   Alert and oriented. Non-toxic appearance. Eyes:   Conjunctivae are normal. EOMI. PERRL. ENT      Head:   Normocephalic and atraumatic.      Nose:   Normal      Mouth/Throat: Dry mucosa.      Neck:   No meningismus. Full ROM. Hematological/Lymphatic/Immunilogical:   No cervical lymphadenopathy. Cardiovascular:   RRR. Symmetric bilateral radial and DP pulses.  No murmurs. Cap refill less than 2 seconds. Respiratory:   Normal respiratory effort without tachypnea/retractions. Breath sounds are clear and equal bilaterally. No wheezes/rales/rhonchi. Gastrointestinal:   Soft with mild suprapubic tenderness. Non distended. There is no CVA tenderness.  No rebound, rigidity, or guarding. Genitourinary:   deferred Musculoskeletal:   Normal range of motion in all extremities. No joint effusions.  No lower extremity tenderness.  No edema. Neurologic:   Normal speech and language.  Motor grossly intact. Ambulatory with steady gait No acute focal neurologic deficits are appreciated.  Skin:    Skin is warm, dry and intact. No rash noted.  No petechiae, purpura, or bullae.  ____________________________________________    LABS (pertinent positives/negatives) (all labs ordered are listed, but only abnormal results are displayed) Labs Reviewed  URINALYSIS, COMPLETE (UACMP) WITH MICROSCOPIC -  Abnormal; Notable for the following components:      Result Value   Color, Urine YELLOW (*)    APPearance CLOUDY (*)    Protein, ur 100 (*)    Leukocytes,Ua LARGE (*)    WBC, UA >50 (*)    Bacteria, UA MANY (*)    All other components within normal limits  URINE CULTURE  CBG MONITORING, ED   ____________________________________________   EKG    ____________________________________________    RADIOLOGY  No results found.  ____________________________________________   PROCEDURES Procedures  ____________________________________________  DIFFERENTIAL DIAGNOSIS   UTI, hyperglycemia, hypoglycemia, dehydration, autonomic insufficiency  CLINICAL IMPRESSION / ASSESSMENT AND PLAN / ED COURSE  Medications ordered in the ED: Medications - No data to display  Pertinent labs & imaging results that were available during my care of the patient were reviewed by me and considered in my medical decision making (see chart for details).  Tracy Dickerson was evaluated in Emergency Department on 07/04/2021 for the symptoms described in the history of present illness. She was evaluated in the context of the global COVID-19 pandemic, which necessitated consideration that the patient might be at risk for infection with the SARS-CoV-2 virus that causes COVID-19. Institutional protocols and algorithms that pertain to the evaluation of patients at risk for COVID-19 are in a state of rapid change based on information released by regulatory bodies including the CDC and federal and state organizations. These policies and algorithms were followed during the patient's care in the ED.   Patient presents with episodes of dizziness today while spending prolonged time upright and walking.  This is in the setting of essentially 0 oral intake today.  Suspect dehydration as a cause of the symptoms.  She is calm and comfortable currently, ambulatory with a steady gait.  Repeated her urinalysis today due to  similar presentation a week ago that led to a diagnosis of cystitis.  Urinalysis is unchanged, again shows inflammatory changes consistent with cystitis.  Urine culture shows resistance to ampicillin, Bactrim, Macrobid, Cipro due to Proteus.  Treatment failure is either related to noncompliance or emerging resistance of first generation cephalosporin.  I will treat with Omnicef and recommend she follow-up with PCP/urology.  CT scan performed just a  few weeks ago negative for staghorn calculus.  I doubt pyelonephritis today.  Vital signs are normal and she is nontoxic.      ____________________________________________   FINAL CLINICAL IMPRESSION(S) / ED DIAGNOSES    Final diagnoses:  Dizziness  Cystitis  Chronic pain of left lower extremity  Type 2 diabetes mellitus without complication, without long-term current use of insulin ([place])  Dehydration     ED Discharge Orders          Ordered    cefdinir (OMNICEF) 300 MG capsule  2 times daily        07/04/21 1822            Portions of this note were generated with dragon dictation software. Dictation errors may occur despite best attempts at proofreading.    Carrie Mew, MD 07/04/21 1827

## 2021-07-04 NOTE — ED Notes (Signed)
Pt states that she was seen last week for the same thing, pt asked what she is being seen for today, pt states that she keeps getting dizzy when she is walking to the store. Pt denies dizziness with walking through her house but longer distances she gets dizzy

## 2021-07-04 NOTE — ED Provider Notes (Signed)
Emergency Medicine Provider Triage Evaluation Note  Tracy Dickerson, a 80 y.o. female  was evaluated in triage.  Pt complains of LLE pain and dizziness. She noted onset after shopping today. She denies fall or LOC. She also denies CP, or SOB. She is a pain management patient  who notes she is not due for her refill of Oxycodone and Gabapentin until tomorrow. She denies any new pain or paresthesias.   Review of Systems  Positive: Chronic LLE pain, dizziness Negative: CP, SOB  Physical Exam  There were no vitals taken for this visit. Gen:   Awake, no distress  NAD Resp:  Normal effort CTT MSK:   Moves extremities without difficulty  Other:  CVS: RRR  Medical Decision Making  Medically screening exam initiated at 3:24 PM.  Appropriate orders placed.  Tracy Dickerson was informed that the remainder of the evaluation will be completed by another provider, this initial triage assessment does not replace that evaluation, and the importance of remaining in the ED until their evaluation is complete.  Patient with ED evaluation of chronic LLE pain and dizziness.    Melvenia Needles, PA-C 07/04/21 1632    Lucrezia Starch, MD 07/04/21 [phone]

## 2021-07-04 NOTE — ED Triage Notes (Addendum)
Pt comes ems from home with chronic leg pain. Sees pain clinic for same. Takes lyrica and oxycodone. Has appointment with them tomorrow. States some "dizziness" but was able to walk to the store 3 times today and get her groceries. Uses a cane.

## 2021-07-07 LAB — URINE CULTURE: Culture: >=100000 — AB

## 2021-09-10 ENCOUNTER — Emergency Department: Payer: Medicare Other

## 2021-09-10 ENCOUNTER — Emergency Department
Admission: EM | Admit: 2021-09-10 | Discharge: 2021-09-10 | Disposition: A | Discharge status: Home / Self Care | Payer: Medicare Other | Attending: Emergency Medicine | Admitting: Emergency Medicine

## 2021-09-10 ENCOUNTER — Encounter: Payer: Self-pay | Admitting: Emergency Medicine

## 2021-09-10 ENCOUNTER — Other Ambulatory Visit: Payer: Self-pay

## 2021-09-10 DIAGNOSIS — Z85118 Personal history of other malignant neoplasm of bronchus and lung: Secondary | ICD-10-CM | POA: Diagnosis not present

## 2021-09-10 DIAGNOSIS — E1122 Type 2 diabetes mellitus with diabetic chronic kidney disease: Secondary | ICD-10-CM | POA: Diagnosis not present

## 2021-09-10 DIAGNOSIS — Z20822 Contact with and (suspected) exposure to covid-19: Secondary | ICD-10-CM | POA: Diagnosis not present

## 2021-09-10 DIAGNOSIS — Z87891 Personal history of nicotine dependence: Secondary | ICD-10-CM | POA: Insufficient documentation

## 2021-09-10 DIAGNOSIS — I129 Hypertensive chronic kidney disease with stage 1 through stage 4 chronic kidney disease, or unspecified chronic kidney disease: Secondary | ICD-10-CM | POA: Insufficient documentation

## 2021-09-10 DIAGNOSIS — Z7901 Long term (current) use of anticoagulants: Secondary | ICD-10-CM | POA: Insufficient documentation

## 2021-09-10 DIAGNOSIS — Z853 Personal history of malignant neoplasm of breast: Secondary | ICD-10-CM | POA: Diagnosis not present

## 2021-09-10 DIAGNOSIS — N1831 Chronic kidney disease, stage 3a: Secondary | ICD-10-CM | POA: Insufficient documentation

## 2021-09-10 DIAGNOSIS — N3000 Acute cystitis without hematuria: Secondary | ICD-10-CM | POA: Diagnosis not present

## 2021-09-10 DIAGNOSIS — Z7984 Long term (current) use of oral hypoglycemic drugs: Secondary | ICD-10-CM | POA: Insufficient documentation

## 2021-09-10 DIAGNOSIS — Z79899 Other long term (current) drug therapy: Secondary | ICD-10-CM | POA: Diagnosis not present

## 2021-09-10 DIAGNOSIS — R112 Nausea with vomiting, unspecified: Secondary | ICD-10-CM | POA: Diagnosis present

## 2021-09-10 LAB — RESP PANEL BY RT-PCR (FLU A&B, COVID) ARPGX2
Influenza A by PCR: NEGATIVE
Influenza B by PCR: NEGATIVE
SARS Coronavirus 2 by RT PCR: NEGATIVE

## 2021-09-10 LAB — URINALYSIS, ROUTINE W REFLEX MICROSCOPIC
Bilirubin Urine: NEGATIVE
Glucose, UA: NEGATIVE mg/dL
Ketones, ur: NEGATIVE mg/dL
Nitrite: NEGATIVE
Protein, ur: 100 mg/dL — AB
Specific Gravity, Urine: 1.019 (ref 1.005–1.030)
WBC, UA: >50 WBC/hpf — ABNORMAL HIGH (ref 0–5)
pH: 7 (ref 5.0–8.0)

## 2021-09-10 LAB — COMPREHENSIVE METABOLIC PANEL
ALT: 12 U/L (ref 0–44)
AST: 19 U/L (ref 15–41)
Albumin: 3.8 g/dL (ref 3.5–5.0)
Alkaline Phosphatase: 65 U/L (ref 38–126)
Anion gap: 3 — ABNORMAL LOW (ref 5–15)
BUN: 19 mg/dL (ref 8–23)
CO2: 26 mmol/L (ref 22–32)
Calcium: 8.7 mg/dL — ABNORMAL LOW (ref 8.9–10.3)
Chloride: 113 mmol/L — ABNORMAL HIGH (ref 98–111)
Creatinine, Ser: 0.94 mg/dL (ref 0.44–1.00)
GFR, Estimated: >60 mL/min (ref >60)
Glucose, Bld: 94 mg/dL (ref 70–99)
Potassium: 3.9 mmol/L (ref 3.5–5.1)
Sodium: 142 mmol/L (ref 135–145)
Total Bilirubin: 0.7 mg/dL (ref 0.3–1.2)
Total Protein: 6.9 g/dL (ref 6.5–8.1)

## 2021-09-10 LAB — CBC
HCT: 39.2 % (ref 36.0–46.0)
Hemoglobin: 12.8 g/dL (ref 12.0–15.0)
MCH: 30 pg (ref 26.0–34.0)
MCHC: 32.7 g/dL (ref 30.0–36.0)
MCV: 91.8 fL (ref 80.0–100.0)
Platelets: 212 10*3/uL (ref 150–400)
RBC: 4.27 MIL/uL (ref 3.87–5.11)
RDW: 14 % (ref 11.5–15.5)
WBC: 8.5 10*3/uL (ref 4.0–10.5)
nRBC: 0 % (ref 0.0–0.2)

## 2021-09-10 LAB — LIPASE, BLOOD: Lipase: 34 U/L (ref 11–51)

## 2021-09-10 MED ORDER — NITROFURANTOIN MONOHYD MACRO 100 MG PO CAPS
100.0000 mg | ORAL_CAPSULE | Freq: Once | ORAL | Status: AC
  Administered 2021-09-10: 15:00:00 100 mg via ORAL
  Filled 2021-09-10: qty 1

## 2021-09-10 MED ORDER — NITROFURANTOIN MONOHYD MACRO 100 MG PO CAPS: 100.0000 mg | ORAL_CAPSULE | Freq: Two times a day (BID) | ORAL | 0 refills | Status: AC

## 2021-09-10 MED ORDER — ONDANSETRON 4 MG PO TBDP
4.0000 mg | ORAL_TABLET | Freq: Once | ORAL | Status: AC
  Administered 2021-09-10: 14:00:00 4 mg via ORAL
  Filled 2021-09-10: qty 1

## 2021-09-10 NOTE — ED Triage Notes (Signed)
Presents via EMS with some n/v for the past 3 days  last time vomited was yesterday

## 2021-09-10 NOTE — ED Notes (Signed)
Pt denies pain at this time. Per EDP no IV needed.

## 2021-09-10 NOTE — Discharge Instructions (Addendum)
Antibiotic twice a day for the next 5 days.  Please return to the emergency department if you develop fevers, abdominal pain, back pain or unable to eat or drink.

## 2021-09-10 NOTE — ED Provider Notes (Signed)
[facility]  ____________________________________________   Event Date/Time   First MD Initiated Contact with Patient 09/10/21 1339     (approximate)  I have reviewed the triage vital signs and the nursing notes.   HISTORY  Chief Complaint Emesis    HPI Tracy Dickerson is a 80 y.o. female past medical history of hypertension, GERD, diabetes who presents with nausea and vomiting.  Patient notes that 3 days ago she had several episodes of nonbloody nonbilious emesis.  Continued yesterday and last night.  She has not had any more vomiting today.  Denies diarrhea.  She has mild abdominal discomfort but not constant.  Denies urinary symptoms.  Denies chest pain.  Does have cough.  Today she was at [facility] with her brother buying food for Christmas when she had a coughing episode which is why she came to emergency department.  She did not eat today but just because she did not have time.  Has had subjective fever.         Past Medical History:  Diagnosis Date   Cancer ([place])    Breast and lung   Diabetes mellitus without complication ([place])    GERD (gastroesophageal reflux disease)    Heart disease    Hypertension     Patient Active Problem List   Diagnosis Date Noted   Constipation    UTI (urinary tract infection) 08/13/2020   HLD (hyperlipidemia) 08/13/2020   Acute renal failure superimposed on stage 3a chronic kidney disease ([place]) 08/13/2020   PAF (paroxysmal atrial fibrillation) ([place]) 08/13/2020   HTN (hypertension) 08/13/2020   Dehydration    Dehydration, moderate    Acute renal failure ([place]) 04/22/2020   Hyperkalemia    Septic shock ([facility])    Sepsis due to urinary tract infection ([place]) 03/13/2018   Acute kidney injury superimposed on chronic kidney disease ([place]) 02/13/2018   Closed extra-articular fracture of distal tibia 02/02/2018   Pneumonia 10/29/2017   Hypotension 01/26/2017    Past Surgical History:  Procedure Laterality Date    ABDOMINAL HYSTERECTOMY     APPENDECTOMY     BREAST SURGERY     breast cancer LEFT   OPEN REDUCTION INTERNAL FIXATION (ORIF) TIBIA/FIBULA FRACTURE Left 02/03/2018   Procedure: OPEN REDUCTION INTERNAL FIXATION (ORIF) DISTAL TIBIA FRACTURE;  Surgeon: Corky Mull, MD;  Location: [facility] ORS;  Service: Orthopedics;  Laterality: Left;  ankle/lower leg   TONSILLECTOMY      Prior to Admission medications   Medication Sig Start Date End Date Taking? Authorizing Provider  nitrofurantoin, macrocrystal-monohydrate, (MACROBID) 100 MG capsule Take 1 capsule (100 mg total) by mouth 2 (two) times daily for 5 days. 09/10/21 09/15/21 Yes Rada Hay, MD  acetaminophen (TYLENOL) 500 MG tablet Take 500 mg by mouth every 6 (six) hours as needed.    [provider]  albuterol (PROAIR HFA) 108 (90 Base) MCG/ACT inhaler Inhale 2 puffs into the lungs every 4 (four) hours as needed for wheezing or shortness of breath.     [provider]  apixaban (ELIQUIS) 5 MG TABS tablet Take 5 mg by mouth 2 (two) times daily. 06/17/18   [provider]  atorvastatin (LIPITOR) 20 MG tablet Take by mouth. 05/01/20   [provider]  butalbital-acetaminophen-caffeine (FIORICET) 50-325-40 MG tablet Take 1 tablet by mouth every 6 (six) hours as needed for headache. 02/16/21 02/16/22  [name], [name], PA-C  cefdinir (OMNICEF) 300 MG capsule Take 1 capsule (300 mg total) by mouth 2 (two)  times daily. 07/04/21   Carrie Mew, MD  dicyclomine (BENTYL) 10 MG capsule Take 1 capsule (10 mg total) by mouth 4 (four) times daily for 14 days. 05/24/21 06/07/21  [name], [name], PA-C  diphenhydramine-acetaminophen (TYLENOL PM) 25-500 MG TABS tablet Take 1-2 tablets by mouth at bedtime as needed (pain or sleep).     [provider]  gabapentin (NEURONTIN) 300 MG capsule Take 2 caspules by mouth at bedtime    [provider]  lisinopril (ZESTRIL) 10 MG tablet Take 10 mg by mouth daily.  06/28/20   [provider]  loperamide (IMODIUM A-D) 2 MG tablet Take 1 tablet (2 mg total) by mouth 4 (four) times daily as needed for diarrhea or loose stools. 05/24/21   [name], [name], PA-C  magic mouthwash SOLN Take 5 mLs by mouth 4 (four) times daily. Swish and swallow 09/11/20   [name], PA-C  metFORMIN (GLUCOPHAGE) 1000 MG tablet Take 1,000 mg by mouth 2 (two) times daily with a meal.    [provider]  neomycin-polymyxin-pramoxine (NEOSPORIN PLUS) 1 % cream Apply topically 2 (two) times daily. 05/27/20   Sable Feil, PA-C  ondansetron (ZOFRAN ODT) 4 MG disintegrating tablet Take 1 tablet (4 mg total) by mouth every 8 (eight) hours as needed. 06/25/21   Menshew, Dannielle Karvonen, PA-C  polyethylene glycol (MIRALAX / GLYCOLAX) 17 g packet Take 17 g by mouth daily. 08/16/20   [name], MD  senna-docusate (SENOKOT-S) 8.6-50 MG tablet Take 2 tablets by mouth at bedtime as needed for mild constipation. 08/15/20   [name], MD  SPIRIVA HANDIHALER 18 MCG inhalation capsule Place 1 capsule (18 mcg total) into inhaler and inhale daily. 04/23/20   [name], MD  traMADol (ULTRAM) 50 MG tablet Take 50 mg by mouth 2 (two) times daily as needed.     [provider]    Allergies Oxycodone, Percocet [oxycodone-acetaminophen], and Penicillins  Family History  Problem Relation Age of Onset   Cancer Mother     Social History Social History   Tobacco Use   Smoking status: Former   Smokeless tobacco: Never  Scientific laboratory technician Use: Never used  Substance Use Topics   Alcohol use: Not Currently   Drug use: No    Review of Systems   Review of Systems  Constitutional:  Positive for appetite change and fever.  Respiratory:  Positive for cough and shortness of breath. Negative for chest tightness.   Cardiovascular:  Negative for chest pain.  Gastrointestinal:  Positive for abdominal pain, nausea and vomiting. Negative for constipation and diarrhea.   Genitourinary:  Negative for dysuria.  Neurological:  Negative for headaches.  All other systems reviewed and are negative.  Physical Exam Updated Vital Signs BP (!) 158/92 (BP Location: Right Arm)    Pulse (!) 103    Temp 98 F (36.7 C)    Resp 18    Ht 5\' 6"  (1.676 m)    Wt 99.3 kg    SpO2 97%    BMI 35.33 kg/m   Physical Exam Vitals and nursing note reviewed.  Constitutional:      General: She is not in acute distress.    Appearance: Normal appearance.  HENT:     Head: Normocephalic and atraumatic.  Eyes:     General: No scleral icterus.    Conjunctiva/sclera: Conjunctivae normal.  Pulmonary:     Effort: Pulmonary effort is normal. No respiratory distress.  Breath sounds: No stridor.  Abdominal:     General: Abdomen is flat.     Palpations: Abdomen is soft.     Comments: Mild tenderness palpation suprapubic region, no guarding  Musculoskeletal:        General: No deformity or signs of injury.     Cervical back: Normal range of motion.  Skin:    General: Skin is dry.     Coloration: Skin is not jaundiced or pale.  Neurological:     General: No focal deficit present.     Mental Status: She is alert and oriented to person, place, and time. Mental status is at baseline.  Psychiatric:        Mood and Affect: Mood normal.        Behavior: Behavior normal.     LABS (all labs ordered are listed, but only abnormal results are displayed)  Labs Reviewed  COMPREHENSIVE METABOLIC PANEL - Abnormal; Notable for the following components:      Result Value   Chloride 113 (*)    Calcium 8.7 (*)    Anion gap 3 (*)    All other components within normal limits  URINALYSIS, ROUTINE W REFLEX MICROSCOPIC - Abnormal; Notable for the following components:   Color, Urine AMBER (*)    APPearance TURBID (*)    Hgb urine dipstick SMALL (*)    Protein, ur 100 (*)    Leukocytes,Ua LARGE (*)    WBC, UA >50 (*)    Bacteria, UA MANY (*)    All other components within normal limits   RESP PANEL BY RT-PCR (FLU A&B, COVID) ARPGX2  LIPASE, BLOOD  CBC   ____________________________________________  EKG  NSR, nml axis, nml intervals, no acute ischemic changes  ____________________________________________  RADIOLOGY Almeta Monas, personally viewed and evaluated these images (plain radiographs) as part of my medical decision making, as well as reviewing the written report by the radiologist.  ED MD interpretation:      ____________________________________________   PROCEDURES  Procedure(s) performed (including Critical Care):  Procedures   ____________________________________________   INITIAL IMPRESSION / ASSESSMENT AND PLAN / ED COURSE     Patient is a 80 year old female presenting with several days of nausea and vomiting.  The nausea and vomiting have largely resolved however she did have episode of coughing today which is primarily why she came in.  Other symptoms include cough subjective fever and some abdominal discomfort.  Vital signs notable for hypertension but otherwise within normal limits.  Patient is very well-appearing.  Abdominal exam is benign she does have some tenderness in the suprapubic region.  Labs are all reassuring.  Her EKG is nonischemic and she has no chest pain to suggest ischemic etiology of her nausea and vomiting.  We will send viral studies and chest x-ray given the cough and shortness of breath.  We will also send UA to rule out UTI.  Will give Zofran and p.o. challenge.  Patient's chest x-ray does not show any infiltrate.  Her UA does suggest UTI.  Given the nausea vomiting and suprapubic tenderness on exam we will treat with Macrobid.  She was given Zofran and tolerated p.o. in the ED.  Repeat abdominal exam continues to be benign.  Will discharge with prescription for antibiotics.  We discussed return precautions.     ____________________________________________   FINAL CLINICAL IMPRESSION(S) / ED  DIAGNOSES  Final diagnoses:  Acute cystitis without hematuria     ED Discharge Orders  Ordered    nitrofurantoin, macrocrystal-monohydrate, (MACROBID) 100 MG capsule  2 times daily        09/10/21 1436             Note:  This document was prepared using Dragon voice recognition software and may include unintentional dictation errors.    Rada Hay, MD 09/10/21 [phone]

## 2021-09-29 ENCOUNTER — Other Ambulatory Visit: Payer: Self-pay

## 2021-09-29 ENCOUNTER — Emergency Department
Admission: EM | Admit: 2021-09-29 | Discharge: 2021-09-29 | Disposition: A | Discharge status: Home / Self Care | Payer: Medicare Other | Attending: Emergency Medicine | Admitting: Emergency Medicine

## 2021-09-29 DIAGNOSIS — R531 Weakness: Secondary | ICD-10-CM | POA: Insufficient documentation

## 2021-09-29 DIAGNOSIS — Z5321 Procedure and treatment not carried out due to patient leaving prior to being seen by health care provider: Secondary | ICD-10-CM | POA: Insufficient documentation

## 2021-09-29 DIAGNOSIS — M79606 Pain in leg, unspecified: Secondary | ICD-10-CM | POA: Insufficient documentation

## 2021-09-29 DIAGNOSIS — R519 Headache, unspecified: Secondary | ICD-10-CM | POA: Diagnosis not present

## 2021-09-29 DIAGNOSIS — Z853 Personal history of malignant neoplasm of breast: Secondary | ICD-10-CM | POA: Insufficient documentation

## 2021-09-29 DIAGNOSIS — R0981 Nasal congestion: Secondary | ICD-10-CM | POA: Insufficient documentation

## 2021-09-29 DIAGNOSIS — R5383 Other fatigue: Secondary | ICD-10-CM | POA: Diagnosis not present

## 2021-09-29 DIAGNOSIS — Z20822 Contact with and (suspected) exposure to covid-19: Secondary | ICD-10-CM | POA: Insufficient documentation

## 2021-09-29 LAB — BASIC METABOLIC PANEL
Anion gap: 8 (ref 5–15)
BUN: 18 mg/dL (ref 8–23)
CO2: 24 mmol/L (ref 22–32)
Calcium: 9.3 mg/dL (ref 8.9–10.3)
Chloride: 108 mmol/L (ref 98–111)
Creatinine, Ser: 0.77 mg/dL (ref 0.44–1.00)
GFR, Estimated: >60 mL/min (ref >60)
Glucose, Bld: 98 mg/dL (ref 70–99)
Potassium: 3.7 mmol/L (ref 3.5–5.1)
Sodium: 140 mmol/L (ref 135–145)

## 2021-09-29 LAB — CBC
HCT: 37.9 % (ref 36.0–46.0)
Hemoglobin: 12.6 g/dL (ref 12.0–15.0)
MCH: 31 pg (ref 26.0–34.0)
MCHC: 33.2 g/dL (ref 30.0–36.0)
MCV: 93.1 fL (ref 80.0–100.0)
Platelets: 218 10*3/uL (ref 150–400)
RBC: 4.07 MIL/uL (ref 3.87–5.11)
RDW: 14.3 % (ref 11.5–15.5)
WBC: 9.3 10*3/uL (ref 4.0–10.5)
nRBC: 0 % (ref 0.0–0.2)

## 2021-09-29 LAB — RESP PANEL BY RT-PCR (FLU A&B, COVID) ARPGX2
Influenza A by PCR: NEGATIVE
Influenza B by PCR: NEGATIVE
SARS Coronavirus 2 by RT PCR: NEGATIVE

## 2021-09-29 NOTE — ED Triage Notes (Addendum)
Patient c/o body aches, leg pain, fatigue. Patient reports she is supposed to have breast cancer surgery next week, on 1/13.

## 2021-09-29 NOTE — ED Triage Notes (Signed)
Arrived by EMS from home with headache and flu like symptoms X1 week. Weakness, congestion and headache. EMS vitals 170/90b/p, 95%RA, 98.6 axillary. EMS reports patient ambulated to truck with NAD

## 2021-10-04 ENCOUNTER — Emergency Department
Admission: EM | Admit: 2021-10-04 | Discharge: 2021-10-04 | Disposition: A | Discharge status: Home / Self Care | Payer: Medicare Other | Attending: Emergency Medicine | Admitting: Emergency Medicine

## 2021-10-04 ENCOUNTER — Encounter: Payer: Self-pay | Admitting: Emergency Medicine

## 2021-10-04 ENCOUNTER — Other Ambulatory Visit: Payer: Self-pay

## 2021-10-04 DIAGNOSIS — Z85820 Personal history of malignant melanoma of skin: Secondary | ICD-10-CM | POA: Diagnosis not present

## 2021-10-04 DIAGNOSIS — L989 Disorder of the skin and subcutaneous tissue, unspecified: Secondary | ICD-10-CM

## 2021-10-04 DIAGNOSIS — L988 Other specified disorders of the skin and subcutaneous tissue: Secondary | ICD-10-CM | POA: Insufficient documentation

## 2021-10-04 MED ORDER — CEPHALEXIN 500 MG PO CAPS: 500.0000 mg | ORAL_CAPSULE | Freq: Three times a day (TID) | ORAL | 0 refills | Status: AC

## 2021-10-04 NOTE — Discharge Instructions (Signed)
Please follow-up with your dermatologist as I have concerns that this area is more of a basal cell carcinoma.  Take the antibiotic as prescribed.  To return if worsening

## 2021-10-04 NOTE — ED Triage Notes (Signed)
Pt comes into the ED via POV c/o a "knot" on the right side of her face.  Pt states it has been there for a month.  Pt in NAD at this time with even and unlabored respirations.

## 2021-10-04 NOTE — ED Provider Notes (Signed)
[facility] Provider Note    Event Date/Time   First MD Initiated Contact with Patient 10/04/21 1046     (approximate)   History   Cyst   HPI  Tracy Dickerson is a 81 y.o. female presents emergency department with her brother with concerns of a bump on the right side of her face.  Patient states area has been there for about 1 month.  Patient has history of melanoma.  States that the area is painful.  No fever or chills.      Physical Exam   Triage Vital Signs: ED Triage Vitals  Enc Vitals Group     BP 10/04/21 1038 (!) 163/97     Pulse Rate 10/04/21 1038 (!) 107     Resp 10/04/21 1038 18     Temp 10/04/21 1038 98.7 F (37.1 C)     Temp Source 10/04/21 1038 Oral     SpO2 10/04/21 1038 95 %     Weight 10/04/21 1027 218 lb 4.1 oz (99 kg)     Height 10/04/21 1027 5\' 6"  (1.676 m)     Head Circumference --      Peak Flow --      Pain Score 10/04/21 1027 6     Pain Loc --      Pain Edu? --      Excl. in [place]? --     Most recent vital signs: Vitals:   10/04/21 1038  BP: (!) 163/97  Pulse: (!) 107  Resp: 18  Temp: 98.7 F (37.1 C)  SpO2: 95%     General: Awake, no distress.   CV:  Good peripheral perfusion.   Resp:  Normal effort.   Abd:  No distention.   Other:  Skin: Raised tender area noted at the right temple, has a scab on top, and cleaned with EtOH pad the area does have pus expelled.   ED Results / Procedures / Treatments   Labs (all labs ordered are listed, but only abnormal results are displayed) Labs Reviewed - No data to display   EKG     RADIOLOGY     PROCEDURES:  Critical Care performed: No  Procedures   MEDICATIONS ORDERED IN ED: Medications - No data to display   IMPRESSION / MDM / [place] / ED COURSE  I reviewed the triage vital signs and the nursing notes.                              Differential diagnosis includes, but is not limited to, abscess, sebaceous cyst, basal cell  carcinoma, squamous cell carcinoma, melanoma  Due to the ulcerated appearance of the lesion I do feel patient needs follow-up with dermatology.  Explained her this could be something similar to basal cell carcinoma and that she needs to have this area rechecked.  Did place her on antibiotic in case this is just a infected sebaceous cyst.  She is to call her regular doctor and follow-up as needed.  Return emergency department if worsening.  Patient and her brother are in agreement treatment plan.  She was discharged stable condition.      FINAL CLINICAL IMPRESSION(S) / ED DIAGNOSES   Final diagnoses:  Facial lesion     Rx / DC Orders   ED Discharge Orders          Ordered    cephALEXin (KEFLEX) 500 MG capsule  3 times  daily        10/04/21 1056             Note:  This document was prepared using Dragon voice recognition software and may include unintentional dictation errors.    Versie Starks, PA-C 10/04/21 1141    Vladimir Crofts, MD 10/04/21 [phone]

## 2021-10-12 ENCOUNTER — Encounter: Payer: Self-pay | Admitting: Emergency Medicine

## 2021-10-12 ENCOUNTER — Emergency Department
Admission: EM | Admit: 2021-10-12 | Discharge: 2021-10-12 | Disposition: A | Discharge status: Home / Self Care | Payer: Medicare Other | Attending: Emergency Medicine | Admitting: Emergency Medicine

## 2021-10-12 DIAGNOSIS — S0180XA Unspecified open wound of other part of head, initial encounter: Secondary | ICD-10-CM | POA: Insufficient documentation

## 2021-10-12 DIAGNOSIS — I10 Essential (primary) hypertension: Secondary | ICD-10-CM | POA: Diagnosis not present

## 2021-10-12 DIAGNOSIS — S0993XA Unspecified injury of face, initial encounter: Secondary | ICD-10-CM | POA: Diagnosis present

## 2021-10-12 DIAGNOSIS — E119 Type 2 diabetes mellitus without complications: Secondary | ICD-10-CM | POA: Diagnosis not present

## 2021-10-12 DIAGNOSIS — X58XXXA Exposure to other specified factors, initial encounter: Secondary | ICD-10-CM | POA: Diagnosis not present

## 2021-10-12 MED ORDER — DOXYCYCLINE HYCLATE 100 MG PO TABS: 100.0000 mg | ORAL_TABLET | Freq: Two times a day (BID) | ORAL | 0 refills | Status: DC

## 2021-10-12 NOTE — ED Provider Notes (Signed)
° °  [facility] Provider Note    Event Date/Time   First MD Initiated Contact with Patient 10/12/21 1336     (approximate)  History   Chief Complaint: Headache  HPI  Tracy Dickerson is a 81 y.o. female with a past medical history of diabetes, hypertension, presents emergency department for evaluation of a right facial wound.  According to the patient and record review patient was seen in the emergency department 10/04/2021 for evaluation of a bump to the right side of her face/temple.  It was aspirated and patient was discharged with Keflex.  Patient was told to follow-up with dermatology but she has not done so.  Patient returns today for continued pain to the area.  Physical Exam   Triage Vital Signs: ED Triage Vitals  Enc Vitals Group     BP 10/12/21 1140 136/76     Pulse Rate 10/12/21 1140 96     Resp 10/12/21 1140 18     Temp 10/12/21 1140 98 F (36.7 C)     Temp Source 10/12/21 1140 Oral     SpO2 10/12/21 1140 95 %     Weight 10/12/21 1123 218 lb 4.1 oz (99 kg)     Height 10/12/21 1123 5\' 6"  (1.676 m)     Head Circumference --      Peak Flow --      Pain Score 10/12/21 1123 6     Pain Loc --      Pain Edu? --      Excl. in [place]? --     Most recent vital signs: Vitals:   10/12/21 1140  BP: 136/76  Pulse: 96  Resp: 18  Temp: 98 F (36.7 C)  SpO2: 95%    General: Awake, no distress.  CV:  Good peripheral perfusion.  Regular rate and rhythm  Resp:  Normal effort.  Equal breath sounds bilaterally.  Abd:  No distention.  Soft, nontender.  No rebound or guarding. Other:  Very small ulceration, 0.25 cm in diameter to the right temple area.  No surrounding erythema, signs of abscess or significant cellulitis    MEDICATIONS ORDERED IN ED: Medications - No data to display   IMPRESSION / MDM / [place] / ED COURSE  I reviewed the triage vital signs and the nursing notes.  Patient presents emergency department for evaluation of a  right facial wound.  Patient was seen in the emergency department 1 week ago had aspiration of this wound and was discharged on Keflex.  I discussed with the patient and the importance of following up with dermatology to further evaluate.  Patient states will take the antibiotic she feels like the wound was improving and now it is hurting her once again.  We will cover with a course of doxycycline and have the patient follow-up with dermatology for further biopsy to rule out any oncologic cause.  Patient agreeable to plan of care.  FINAL CLINICAL IMPRESSION(S) / ED DIAGNOSES   Facial wound  Rx / DC Orders   Doxycycline  Note:  This document was prepared using Dragon voice recognition software and may include unintentional dictation errors.   Harvest Dark, MD 10/12/21 1416

## 2021-10-12 NOTE — ED Triage Notes (Signed)
Arrives via [facility]. C/O right temple headache x several months.  AAOx3/  skin warm and dry.    189/103 97% RA 107 P 16 R

## 2021-10-12 NOTE — Discharge Instructions (Signed)
As we discussed please call the number provided for dermatology to arrange a follow-up appointment soon as possible for further evaluation and likely biopsy of this area.  Please take your antibiotic as prescribed for its entire course.  Return to the emergency department for any symptom personally concerning to yourself.

## 2021-10-12 NOTE — ED Notes (Signed)
Pt to ED for "a knot" on R cheek that has been present for about 2 weeks and is painful. Was seen here and recommended to see dermatologist. Has not seen dermatologist yet.  Brother at bedside.

## 2021-10-18 ENCOUNTER — Emergency Department: Payer: Medicare Other

## 2021-10-18 ENCOUNTER — Emergency Department
Admission: EM | Admit: 2021-10-18 | Discharge: 2021-10-18 | Disposition: A | Discharge status: Home / Self Care | Payer: Medicare Other | Attending: Emergency Medicine | Admitting: Emergency Medicine

## 2021-10-18 ENCOUNTER — Other Ambulatory Visit: Payer: Self-pay

## 2021-10-18 DIAGNOSIS — S0180XD Unspecified open wound of other part of head, subsequent encounter: Secondary | ICD-10-CM | POA: Insufficient documentation

## 2021-10-18 DIAGNOSIS — X58XXXD Exposure to other specified factors, subsequent encounter: Secondary | ICD-10-CM | POA: Insufficient documentation

## 2021-10-18 DIAGNOSIS — S0993XD Unspecified injury of face, subsequent encounter: Secondary | ICD-10-CM | POA: Diagnosis present

## 2021-10-18 MED ORDER — ACETAMINOPHEN 325 MG PO TABS
650.0000 mg | ORAL_TABLET | Freq: Once | ORAL | Status: AC
  Administered 2021-10-18: 650 mg via ORAL
  Filled 2021-10-18: qty 2

## 2021-10-18 NOTE — ED Notes (Addendum)
See triage note  presents with possible abscess area to right cheek area   states she has been seen for same   states area is more painful

## 2021-10-18 NOTE — ED Triage Notes (Signed)
Pt to ED [facility] for abscess to right forehead for several days. Has been seen x2 for same. Seen at [facility] yesterday, states it still hurts. Has not followed up with dermatologist. Alert and oriented. NAD noted.

## 2021-10-18 NOTE — ED Provider Notes (Signed)
[facility] Provider Note    Event Date/Time   First MD Initiated Contact with Patient 10/18/21 1016     (approximate)   History   Abscess   HPI  Tracy Dickerson is a 81 y.o. female who was seen here previously and at [facility] with concerns of an abscess to the right side of her face.  I did originally see the patient a few weeks ago.  There was a pustule with a deep hole after the pustule was removed.  Concerns for a skin carcinoma.  Had instructed the patient to follow-up with dermatology.  She has not followed up with dermatology at this time.  She continues to complain of pain at the right side even though the area has started to heal.  No fever or chills.  Has been taking medications as prescribed.      Physical Exam   Triage Vital Signs: ED Triage Vitals  Enc Vitals Group     BP 10/18/21 1009 (!) 148/94     Pulse Rate 10/18/21 1009 89     Resp 10/18/21 1007 20     Temp 10/18/21 1007 98.1 F (36.7 C)     Temp Source 10/18/21 1007 Oral     SpO2 10/18/21 1009 92 %     Weight 10/18/21 1007 140 lb (63.5 kg)     Height 10/18/21 1007 5\' 6"  (1.676 m)     Head Circumference --      Peak Flow --      Pain Score 10/18/21 1007 10     Pain Loc --      Pain Edu? --      Excl. in [place]? --     Most recent vital signs: Vitals:   10/18/21 1007 10/18/21 1009  BP:  (!) 148/94  Pulse:  89  Resp: 20   Temp: 98.1 F (36.7 C)   SpO2:  92%     General: Awake, no distress.   CV:  Good peripheral perfusion. regular rate and  rhythm Resp:  Normal effort.  Abd:  No distention.   Other:  Right temple area from the previous wound has healed and and there is no longer a hole noted, area is still tender to palpation, no pus or drainage noted   ED Results / Procedures / Treatments   Labs (all labs ordered are listed, but only abnormal results are displayed) Labs Reviewed - No data to display   EKG     RADIOLOGY CT  maxillofacial    PROCEDURES:   Procedures   MEDICATIONS ORDERED IN ED: Medications  acetaminophen (TYLENOL) tablet 650 mg (has no administration in time range)     IMPRESSION / MDM / ASSESSMENT AND PLAN / ED COURSE  I reviewed the triage vital signs and the nursing notes.                              Differential diagnosis includes, but is not limited to, osteomyelitis, carcinoma, abscess  CT maxillofacial ordered  I did review the CT and do not see any obvious malformation.  Awaiting radiology read.  Radiologist read the CT and is negative except for skin thickening.  Once again encouraged patient to follow-up with dermatology.  Explained her she should finish her antibiotics.  She can take Tylenol for pain.  Apply warm compress.  She is in agreement treatment plan.  She is discharged stable condition  FINAL CLINICAL IMPRESSION(S) / ED DIAGNOSES   Final diagnoses:  Open facial wound, subsequent encounter     Rx / DC Orders   ED Discharge Orders     None        Note:  This document was prepared using Dragon voice recognition software and may include unintentional dictation errors.    Versie Starks, PA-C 10/18/21 1124    Harvest Dark, MD 10/18/21 1320

## 2021-10-18 NOTE — Discharge Instructions (Signed)
Follow-up with dermatology.  Please call for an appointment

## 2021-11-05 ENCOUNTER — Telehealth: Payer: Self-pay

## 2021-11-05 NOTE — Telephone Encounter (Signed)
10 am.  Phone call made to patient to schedule a home visit with Palliative Care NP.  No answer and unable to leave a message.  Will try again at a later date.

## 2021-11-15 ENCOUNTER — Telehealth: Payer: Self-pay

## 2021-11-15 NOTE — Telephone Encounter (Signed)
Volunteer call to check on patient for palliative care, No answer. Will discharge from palliative

## 2021-11-22 ENCOUNTER — Emergency Department
Admission: EM | Admit: 2021-11-22 | Discharge: 2021-11-22 | Disposition: A | Discharge status: Home / Self Care | Payer: Medicare Other | Attending: Emergency Medicine | Admitting: Emergency Medicine

## 2021-11-22 ENCOUNTER — Other Ambulatory Visit: Payer: Self-pay

## 2021-11-22 DIAGNOSIS — I10 Essential (primary) hypertension: Secondary | ICD-10-CM | POA: Diagnosis not present

## 2021-11-22 DIAGNOSIS — L03313 Cellulitis of chest wall: Secondary | ICD-10-CM | POA: Diagnosis present

## 2021-11-22 MED ORDER — DOXYCYCLINE HYCLATE 100 MG PO TABS: 100.0000 mg | ORAL_TABLET | Freq: Two times a day (BID) | ORAL | 0 refills | Status: DC

## 2021-11-22 MED ORDER — DOXYCYCLINE HYCLATE 100 MG PO TABS: 100.0000 mg | ORAL_TABLET | Freq: Two times a day (BID) | ORAL | 0 refills | Status: AC

## 2021-11-22 NOTE — ED Notes (Signed)
See triage note  presents with red inflamed area to left upper chest  states she noticed the area couple of months ago   ?

## 2021-11-22 NOTE — Discharge Instructions (Addendum)
-  Take all of antibiotics as prescribed. ?-Return to the emergency department anytime if you begin to experience any new or worsening symptoms ?

## 2021-11-22 NOTE — ED Provider Notes (Signed)
? ?[facility] ?Provider Note ? ? ? Event Date/Time  ? First MD Initiated Contact with Patient 11/22/21 0915   ?  (approximate) ? ? ?History  ? ?Chief Complaint ?Cellulitis ? ? ?HPI ?Tracy Dickerson is a 81 y.o. female, history of renal failure, hyperlipidemia, A-fib, hypertension, presents to the emergency department for evaluation of suspected skin infection.  Patient states that she had a mole removed on her upper left chest in February 2023.  However, she states that about a week ago, it began turning red, burning, and leaking clear fluid.  She has been putting pain cream on it, but this is not helped.  Denies fever/chills, chest pain, shortness of breath, abdominal pain, headache, urinary symptoms, numbness/tingling in upper or lower extremities, or diarrhea. ? ?History Limitations: No limitations. ? ?  ? ? ?Physical Exam  ?Triage Vital Signs: ?ED Triage Vitals  ?Enc Vitals Group  ?   BP 11/22/21 0857 121/69  ?   Pulse Rate 11/22/21 0856 76  ?   Resp 11/22/21 0856 18  ?   Temp 11/22/21 0856 98 ?F (36.7 ?C)  ?   Temp Source 11/22/21 0856 Oral  ?   SpO2 11/22/21 0856 96 %  ?   Weight 11/22/21 0857 190 lb (86.2 kg)  ?   Height 11/22/21 0857 5\' 6"  (1.676 m)  ?   Head Circumference --   ?   Peak Flow --   ?   Pain Score 11/22/21 0856 9  ?   Pain Loc --   ?   Pain Edu? --   ?   Excl. in [place]? --   ? ? ?Most recent vital signs: ?Vitals:  ? 11/22/21 0856 11/22/21 0857  ?BP:  121/69  ?Pulse: 76   ?Resp: 18   ?Temp: 98 ?F (36.7 ?C)   ?SpO2: 96%   ? ? ?General: Awake, NAD.  ?CV: Good peripheral perfusion.  ?Resp: Normal effort.  ?Abd: Soft, non-tender. No distention.  ?Neuro: At baseline. No gross neurological deficits. ?Other: 3 x 3 cm erythematous rash with central clear drainage.  No notable fluctuance.  Exquisitely tender to the touch. ? ?Physical Exam ? ? ? ?ED Results / Procedures / Treatments  ?Labs ?(all labs ordered are listed, but only abnormal results are displayed) ?Labs Reviewed - No data to  display ? ? ?EKG ?Not applicable. ? ? ?RADIOLOGY ? ?ED Provider Interpretation: Not applicable. ? ?No results found. ? ?PROCEDURES: ? ?Critical Care performed: Not applicable. ? ?Procedures ? ? ? ?MEDICATIONS ORDERED IN ED: ?Medications - No data to display ? ? ?IMPRESSION / MDM / ASSESSMENT AND PLAN / ED COURSE  ?I reviewed the triage vital signs and the nursing notes. ?             ?               ? ?Differential diagnosis includes, but is not limited to, cellulitis, abscess, contact dermatitis, allergic dermatitis. ? ?ED Course ?Patient appears well.  Vital signs within normal limits.  NAD. ? ?Assessment/Plan ?Presentation consistent with cellulitis, likely secondary to recent mole removal 4 weeks prior.  No fluctuance appreciated.  Does not appear drainable.  Will discharge this patient with a prescription for doxycycline.  Encouraged her to follow-up with her regular doctor as needed. ? ?Patient was provided with anticipatory guidance, return precautions, and educational material. Encouraged the patient to return to the emergency department at any time if they begin to experience any new  or worsening symptoms.  ? ?  ? ? ?FINAL CLINICAL IMPRESSION(S) / ED DIAGNOSES  ? ?Final diagnoses:  ?Cellulitis of chest wall  ? ? ? ?Rx / DC Orders  ? ?ED Discharge Orders   ? ?      Ordered  ?  doxycycline (VIBRA-TABS) 100 MG tablet  2 times daily,   Status:  Discontinued       ? 11/22/21 0921  ?  doxycycline (VIBRA-TABS) 100 MG tablet  2 times daily       ? 11/22/21 3500  ? ?  ?  ? ?  ? ? ? ?Note:  This document was prepared using Dragon voice recognition software and may include unintentional dictation errors. ?  ?Teodoro Spray, [place] ?11/22/21 9381 ? ?  ?Nena Polio, MD ?11/22/21 1642 ? ?

## 2021-11-22 NOTE — ED Triage Notes (Signed)
Pt had mole removed on her upper left chest in February 2023, pt thinks it was about a week ago. Pt states it is burning even though she is putting pain cream on it. Pt with 2 inch wide reddened area on upper left chest with clear fluid draining from it. ?

## 2022-01-05 ENCOUNTER — Encounter: Payer: Self-pay | Admitting: Emergency Medicine

## 2022-01-05 ENCOUNTER — Other Ambulatory Visit: Payer: Self-pay

## 2022-01-05 ENCOUNTER — Emergency Department
Admission: EM | Admit: 2022-01-05 | Discharge: 2022-01-05 | Disposition: A | Discharge status: Home / Self Care | Payer: Medicare (Managed Care) | Attending: Emergency Medicine | Admitting: Emergency Medicine

## 2022-01-05 DIAGNOSIS — L03313 Cellulitis of chest wall: Secondary | ICD-10-CM

## 2022-01-05 DIAGNOSIS — R0789 Other chest pain: Secondary | ICD-10-CM | POA: Diagnosis present

## 2022-01-05 MED ORDER — CLINDAMYCIN HCL 300 MG PO CAPS: 300.0000 mg | ORAL_CAPSULE | Freq: Three times a day (TID) | ORAL | 0 refills | Status: AC

## 2022-01-05 MED ORDER — CLINDAMYCIN HCL 150 MG PO CAPS
300.0000 mg | ORAL_CAPSULE | Freq: Once | ORAL | Status: AC
  Administered 2022-01-05: 300 mg via ORAL
  Filled 2022-01-05: qty 2

## 2022-01-05 NOTE — ED Provider Notes (Signed)
? ? ?[facility] ?Emergency Department Provider Note ? ? ? ? Event Date/Time  ? First MD Initiated Contact with Patient 01/05/22 1340   ?  (approximate) ? ? ?History  ? ?Skin Problem ? ? ?HPI ? ?Tracy Dickerson is a 81 y.o. female presents to the ED via EMS from home, for evaluation of a persistent chronic mole on her anterior chest.  Patient was seen in the ED about 4 weeks ago for the same complaint, completed a course of doxycycline as provided.  She reports she has a referral to a dermatologist and a scheduled to see them in 2 weeks.  She denies any spontaneous purulent drainage, itching, or bleeding.  She does report some local redness.  No fevers, chills, sweats are reported.  ? ? ?Physical Exam  ? ?Triage Vital Signs: ?ED Triage Vitals  ?Enc Vitals Group  ?   BP 01/05/22 1312 (!) 130/59  ?   Pulse Rate 01/05/22 1312 72  ?   Resp 01/05/22 1312 18  ?   Temp 01/05/22 1312 (!) 97.5 ?F (36.4 ?C)  ?   Temp Source 01/05/22 1312 Oral  ?   SpO2 01/05/22 1312 97 %  ?   Weight 01/05/22 1311 212 lb (96.2 kg)  ?   Height 01/05/22 1311 5\' 6"  (1.676 m)  ?   Head Circumference --   ?   Peak Flow --   ?   Pain Score 01/05/22 1311 9  ?   Pain Loc --   ?   Pain Edu? --   ?   Excl. in [place]? --   ? ? ?Most recent vital signs: ?Vitals:  ? 01/05/22 1312  ?BP: (!) 130/59  ?Pulse: 72  ?Resp: 18  ?Temp: (!) 97.5 ?F (36.4 ?C)  ?SpO2: 97%  ? ? ?General Awake, no distress.  ?CV:  Good peripheral perfusion.  ?RESP:  Normal effort.  ?ABD:  No distention.  ?SKIN:  Patient with a skin colored papule noted to the anterior upper right chest.  No purulence, pointing, fluctuance, or punctum noted.  There is some surrounding superficial erythema appreciated. ? ? ?ED Results / Procedures / Treatments  ? ?Labs ?(all labs ordered are listed, but only abnormal results are displayed) ?Labs Reviewed - No data to display ? ? ?EKG ? ? ?RADIOLOGY ? ? ? ?No results found. ? ? ?PROCEDURES: ? ?Critical Care performed:  No ? ?Procedures ? ? ?MEDICATIONS ORDERED IN ED: ?Medications  ?clindamycin (CLEOCIN) capsule 300 mg (300 mg Oral Given 01/05/22 1353)  ? ? ? ?IMPRESSION / MDM / ASSESSMENT AND PLAN / ED COURSE  ?I reviewed the triage vital signs and the nursing notes. ?             ?               ? ?Differential diagnosis includes, but is not limited to, cellulitis, abscess, infected sebaceous cyst ? ?Patient to the Ed for evaluation of a persistent anterior chest cystic lesion.  Patient's diagnosis is consistent with local cellulitis. Patient will be discharged home with prescriptions for clindamycin. Patient is to follow up with dermatologist as scheduled as needed or otherwise directed. Patient is given ED precautions to return to the ED for any worsening or new symptoms. ? ?  ?FINAL CLINICAL IMPRESSION(S) / ED DIAGNOSES  ? ?Final diagnoses:  ?Cellulitis of chest wall  ? ? ? ?Rx / DC Orders  ? ?ED Discharge Orders   ? ?  Ordered  ?  clindamycin (CLEOCIN) 300 MG capsule  3 times daily       ? 01/05/22 1352  ? ?  ?  ? ?  ? ? ? ?Note:  This document was prepared using Dragon voice recognition software and may include unintentional dictation errors. ? ?  ?Melvenia Needles, PA-C ?01/05/22 2024 ? ?  ?Nena Polio, MD ?01/06/22 1559 ? ?

## 2022-01-05 NOTE — Discharge Instructions (Signed)
Take the antibiotics as prescribed.  Follow-up with dermatologist as scheduled May 1. ?

## 2022-01-05 NOTE — ED Triage Notes (Signed)
Patient to ED via [facility] from home for a mole on chest. Patient states it has been there "for a long time" and does not want to wait to be seen by PCP. Aox4. ?

## 2022-01-23 ENCOUNTER — Emergency Department
Admission: EM | Admit: 2022-01-23 | Discharge: 2022-01-23 | Disposition: A | Discharge status: Home / Self Care | Payer: Medicare (Managed Care) | Attending: Emergency Medicine | Admitting: Emergency Medicine

## 2022-01-23 DIAGNOSIS — I10 Essential (primary) hypertension: Secondary | ICD-10-CM | POA: Insufficient documentation

## 2022-01-23 DIAGNOSIS — L989 Disorder of the skin and subcutaneous tissue, unspecified: Secondary | ICD-10-CM | POA: Diagnosis present

## 2022-01-23 DIAGNOSIS — L988 Other specified disorders of the skin and subcutaneous tissue: Secondary | ICD-10-CM | POA: Insufficient documentation

## 2022-01-23 MED ORDER — LIDOCAINE HCL (PF) 1 % IJ SOLN: 5.0000 mL | Freq: Once | INTRAMUSCULAR | Status: DC

## 2022-01-23 NOTE — Discharge Instructions (Addendum)
Please go to the dermatologist on 03/27/22 at 935am in [place]. Bring your insurance card and ID. Return for any new, worsening, or change in symptoms or other concerns.  ?

## 2022-01-23 NOTE — ED Triage Notes (Addendum)
Pt has two moles and PCP wanted "another doctor to evaluate them". Side of face is a mole, chest spot is a little red and raised.  ?

## 2022-01-23 NOTE — ED Triage Notes (Signed)
First Nurse Note:  Sent by PCP to have 'spot of face checked'.   ? ?AAOx3.  Skin warm and dry. NAD ?

## 2022-01-23 NOTE — ED Provider Notes (Signed)
? ?[facility] ?Provider Note ? ? ? Event Date/Time  ? First MD Initiated Contact with Patient 01/23/22 1618   ?  (approximate) ? ? ?History  ? ?[place] ? ? ?HPI ? ?Tracy Dickerson is a 81 y.o. female who presents today for evaluation of left anterior chest wall skin lesion and right temporal lesion.  Patient reports that the right temporal lesion has been ongoing for several months, and the left anterior chest wall skin lesion appeared over the past couple of weeks.  She does not feel that it is getting larger.  It is tender to palpation.  She reports that she was treated with antibiotics for her temporal lesion and this has improved significantly.  She denies any fevers.  She denies any injuries.  She was seen by her primary care provider who advised her to have a second opinion by a dermatologist, which she attempted to call but reports that they "hung up on me."  She was not able to schedule an appointment, so she came to the ER. ? ?Patient Active Problem List  ? Diagnosis Date Noted  ? Constipation   ? UTI (urinary tract infection) 08/13/2020  ? HLD (hyperlipidemia) 08/13/2020  ? 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 PAF (paroxysmal atrial fibrillation) ([place]) 08/13/2020  ? HTN (hypertension) 08/13/2020  ? Dehydration   ? Dehydration, moderate   ? Acute renal failure ([place]) 04/22/2020  ? Hyperkalemia   ? Septic shock ([place])   ? Sepsis due to urinary tract infection ([place]) 03/13/2018  ? Acute kidney injury superimposed on chronic kidney disease ([place]) 02/13/2018  ? Closed extra-articular fracture of distal tibia 02/02/2018  ? Pneumonia 10/29/2017  ? Hypotension 01/26/2017  ? ? ?  ? ? ?Physical Exam  ? ?Triage Vital Signs: ?ED Triage Vitals  ?Enc Vitals Group  ?   BP 01/23/22 1551 (!) 134/54  ?   Pulse Rate 01/23/22 1551 98  ?   Resp 01/23/22 1551 18  ?   Temp 01/23/22 1552 98.4 ?F (36.9 ?C)  ?   Temp Source 01/23/22 1552 Oral  ?   SpO2 01/23/22 1551  95 %  ?   Weight --   ?   Height --   ?   Head Circumference --   ?   Peak Flow --   ?   Pain Score 01/23/22 1551 0  ?   Pain Loc --   ?   Pain Edu? --   ?   Excl. in ? --   ? ? ?Most recent vital signs: ?Vitals:  ? 01/23/22 1551 01/23/22 1552  ?BP: (!) 134/54   ?Pulse: 98   ?Resp: 18   ?Temp:  98.4 ?F (36.9 ?C)  ?SpO2: 95%   ? ? ?General: Awake, no distress.  ?CV:  Good peripheral perfusion.  ?Resp:  Normal effort.  ?Abd:  No distention or TTP ?Other:  Skin: Raised circular/nodular  1 x 1 cm open area to left anterior chest wall with minimal to no surrounding erythema. No drainage, no crepitus. Right temporal area of face with 1 x 1 mm dry crusting lesion without surrounding erythema, nontender to palpation. No swelling ? ? ?ED Results / Procedures / Treatments  ? ?Labs ?(all labs ordered are listed, but only abnormal results are displayed) ?Labs Reviewed - No data to display ? ? ?EKG ? ? ? ? ?RADIOLOGY ? ? ? ? ?PROCEDURES: ? ?Critical Care performed:  ? ?  Procedures ? ? ?MEDICATIONS ORDERED IN ED: ?Medications - No data to display ? ? ?IMPRESSION / MDM / ASSESSMENT AND PLAN / ED COURSE  ?I reviewed the triage vital signs and the nursing notes. ? ? ?Differential diagnosis includes, but is not limited to, benign vs malignant skin lesion. No erythema, drainage, or fever to suggest infection. No fluctuance to suggest abscess. No hemodynamic instability or fever. I do think patient needs to have this area biopsied, which I explained to patient, though unfortunately this cannot be done in the ER. I called the dermatology office to schedule and appointment for her, patient was happy with this.  We discussed the importance of biopsy due to concern for malignancy and the importance of prompt follow-up.  Patient understands and agrees.  She was discharged in stable condition. ?  ? ? ?FINAL CLINICAL IMPRESSION(S) / ED DIAGNOSES  ? ?Final diagnoses:  ?Skin lesion of chest wall  ?Skin lesion of face  ? ? ? ?Rx / DC Orders   ? ?ED Discharge Orders   ? ? None  ? ?  ? ? ? ?Note:  This document was prepared using Dragon voice recognition software and may include unintentional dictation errors. ?  ?Marquette Old, PA-C ?01/23/22 2010 ? ?  ?Duffy Bruce, MD ?01/27/22 2151 ? ?

## 2022-02-09 ENCOUNTER — Encounter: Payer: Self-pay | Admitting: Emergency Medicine

## 2022-02-09 ENCOUNTER — Emergency Department
Admission: EM | Admit: 2022-02-09 | Discharge: 2022-02-09 | Disposition: A | Discharge status: Home / Self Care | Payer: Medicare (Managed Care) | Attending: Emergency Medicine | Admitting: Emergency Medicine

## 2022-02-09 ENCOUNTER — Other Ambulatory Visit: Payer: Self-pay

## 2022-02-09 DIAGNOSIS — Z4800 Encounter for change or removal of nonsurgical wound dressing: Secondary | ICD-10-CM | POA: Diagnosis present

## 2022-02-09 DIAGNOSIS — Z5189 Encounter for other specified aftercare: Secondary | ICD-10-CM

## 2022-02-09 MED ORDER — LIDOCAINE VISCOUS HCL 2 % MT SOLN: 15.0000 mL | Freq: Four times a day (QID) | OROMUCOSAL | 0 refills | Status: AC | PRN

## 2022-02-09 MED ORDER — SPIRIVA HANDIHALER 18 MCG IN CAPS
1.0000 | ORAL_CAPSULE | Freq: Every day | RESPIRATORY_TRACT | 2 refills | Status: AC
Start: 2022-02-09 — End: ?

## 2022-02-09 NOTE — ED Triage Notes (Signed)
Pt via EMS from home. Pt c/o skin lesion on L side of the patient chest. Pt was seen at the beginning of the month for same. Pt states it still continues to hurt and her follow up isn't till July. Red spot noted to L side of patients chest. Pt is A&OX4 and NAD

## 2022-02-09 NOTE — ED Provider Notes (Signed)
[facility] Provider Note  Patient Contact: 3:45 PM (approximate)   History   Wound Check   HPI  Tracy Dickerson is a 81 y.o. female presents to the emergency department to have skin lesions of the chest and right temple reexamine.  Patient was recently seen in this emergency department for similar complaints and provider helped patient secure an appointment with dermatology.  Patient reports that her appointment is in July but was curious if anything can be done for pain in the meantime.  She denies chest tightness or shortness of breath.  No fever at home.      Physical Exam   Triage Vital Signs: ED Triage Vitals [02/09/22 1444]  Enc Vitals Group     BP (!) 138/92     Pulse Rate 100     Resp 20     Temp 98.5 F (36.9 C)     Temp Source Oral     SpO2 98 %     Weight 190 lb (86.2 kg)     Height 5\' 6"  (1.676 m)     Head Circumference      Peak Flow      Pain Score 10     Pain Loc      Pain Edu?      Excl. in [place]?     Most recent vital signs: Vitals:   02/09/22 1444  BP: (!) 138/92  Pulse: 100  Resp: 20  Temp: 98.5 F (36.9 C)  SpO2: 98%     General: Alert and in no acute distress. Eyes:  PERRL. EOMI. Head: No acute traumatic findings ENT:      Nose: No congestion/rhinnorhea.      Mouth/Throat: Mucous membranes are moist. Neck: No stridor. No cervical spine tenderness to palpation. Cardiovascular:  Good peripheral perfusion Respiratory: Normal respiratory effort without tachypnea or retractions. Lungs CTAB. Good air entry to the bases with no decreased or absent breath sounds. Gastrointestinal: Bowel sounds 4 quadrants. Soft and nontender to palpation. No guarding or rigidity. No palpable masses. No distention. No CVA tenderness. Musculoskeletal: Full range of motion to all extremities.  Neurologic:  No gross focal neurologic deficits are appreciated.  Skin: Patient has a papular appearing skin lesion on chest approximately 3 cm in  width by 2 cm.  Patient also has a 1 cm x 1 cm papular lesion along right temple.    ED Results / Procedures / Treatments   Labs (all labs ordered are listed, but only abnormal results are displayed) Labs Reviewed - No data to display    PROCEDURES:  Critical Care performed: No  Procedures   MEDICATIONS ORDERED IN ED: Medications - No data to display   IMPRESSION / MDM / [place] / ED COURSE  I reviewed the triage vital signs and the nursing notes.                              Assessment and plan Skin lesion 81 year old female presents to the emergency department with a 3 cm x 2 cm skin lesion along anterior chest and a 1 cm x 1 cm region at right temple.  Recommended keeping appointment with dermatology as skin lesions will need biopsies.  I prescribed patient topical lidocaine to use for discomfort and recommended Tylenol.  All patient questions were answered.     FINAL CLINICAL IMPRESSION(S) / ED DIAGNOSES   Final diagnoses:  Visit for  wound check     Rx / DC Orders   ED Discharge Orders          Ordered    lidocaine (XYLOCAINE) 2 % solution  Every 6 hours PRN        02/09/22 1530    SPIRIVA HANDIHALER 18 MCG inhalation capsule  Daily        02/09/22 1530             Note:  This document was prepared using Dragon voice recognition software and may include unintentional dictation errors.   Khiya, [place], PA-C 02/09/22 1551    Vanessa [place], MD 02/09/22 [phone]

## 2022-02-09 NOTE — Discharge Instructions (Signed)
Please keep appointment with dermatology. You can apply viscous lidocaine to the affected areas for pain relief.

## 2022-02-13 ENCOUNTER — Emergency Department
Admission: EM | Admit: 2022-02-13 | Discharge: 2022-02-13 | Discharge status: Against Medical Advice | Payer: Medicare (Managed Care) | Attending: Emergency Medicine | Admitting: Emergency Medicine

## 2022-02-13 ENCOUNTER — Other Ambulatory Visit: Payer: Self-pay

## 2022-02-13 DIAGNOSIS — Z5189 Encounter for other specified aftercare: Secondary | ICD-10-CM

## 2022-02-13 DIAGNOSIS — Z4800 Encounter for change or removal of nonsurgical wound dressing: Secondary | ICD-10-CM | POA: Diagnosis present

## 2022-02-13 NOTE — ED Provider Notes (Signed)
[facility] Provider Note    Event Date/Time   First MD Initiated Contact with Patient 02/13/22 1242     (approximate)   History   Wound Check   HPI  KARIMA CARRELL is a 81 y.o. female who comes in with concern for lesion on the chest wall.  Patient reports having rheumatology appointment on June 1 but she was stating that she started having some pain when she hit that area.  She denies any other chest tightness and states that this is the same pain that she typically gets when she pushes up against the area.  Denies any shortness of breath or any other concerns.   Physical Exam   Triage Vital Signs: ED Triage Vitals  Enc Vitals Group     BP 02/13/22 1122 134/70     Pulse Rate 02/13/22 1122 89     Resp 02/13/22 1122 18     Temp 02/13/22 1122 98 F (36.7 C)     Temp src --      SpO2 02/13/22 1122 100 %     Weight --      Height --      Head Circumference --      Peak Flow --      Pain Score 02/13/22 1120 4     Pain Loc --      Pain Edu? --      Excl. in [place]? --     Most recent vital signs: Vitals:   02/13/22 1122  BP: 134/70  Pulse: 89  Resp: 18  Temp: 98 F (36.7 C)  SpO2: 100%     General: Awake, no distress.  CV:  Good peripheral perfusion.  Resp:  Normal effort.  Abd:  No distention.  Other:  Patient has papular appearing skin lesion on chest about 3 x 2 cm without any surrounding erythema or redness   ED Results / Procedures / Treatments   Labs (all labs ordered are listed, but only abnormal results are displayed) Labs Reviewed - No data to display     PROCEDURES:  Critical Care performed: No  Procedures   MEDICATIONS ORDERED IN ED: Medications - No data to display   IMPRESSION / MDM / [place] / ED COURSE  I reviewed the triage vital signs and the nursing notes.  Differential includes cellulitis, abscess, lesion, cancer  Patient has skin lesion on the anterior chest wall without any  associated cellulitis or abscess.  Discussed with patient need to follow-up with dermatology for biopsy and removal to ensure that if it is cancer all the edges are clear.  She reports that the same pain that she typically has a do not feel like this is ACS but will get EKG We discussed Tylenol for pain and following up with the dermatology as planned she expressed understanding felt comfortable with   I reviewed patient's office visit from [facility] on 01/21/2022.  Patient agreed to get an EKG but then was seen walking out of the ER without getting it therefore she eloped prior to getting discharge summaries and complete care  FINAL CLINICAL IMPRESSION(S) / ED DIAGNOSES   Final diagnoses:  Visit for wound check     Rx / DC Orders   ED Discharge Orders     None        Note:  This document was prepared using Dragon voice recognition software and may include unintentional dictation errors.   Vanessa [place], MD 02/13/22  1320  

## 2022-02-13 NOTE — ED Notes (Signed)
Pt came to the door asking for DC papers. This RN informed her that papers were not up yet. Pt states that she needed to be outside when her ride got here. RN explained papers should be ready by then. Pt states that she is going to go ahead and walk out stating that she did not want to wait on her papers. EDP [place] aware.

## 2022-02-13 NOTE — ED Provider Triage Note (Signed)
Emergency Medicine Provider Triage Evaluation Note  Tracy Dickerson , a 81 y.o. female  was evaluated in triage.  Pt complains of lesion to the chest that has been present for 3 months or more. She has an appointment in June with a specialist, but wants it checked today .  Review of Systems  Positive: Skin lesion Negative: Fever  Physical Exam  There were no vitals taken for this visit. Gen:   Awake, no distress   Resp:  Normal effort  MSK:   Moves extremities without difficulty  Other:    Medical Decision Making  Medically screening exam initiated at 11:22 AM.  Appropriate orders placed.  KATHYJO BRIERE was informed that the remainder of the evaluation will be completed by another provider, this initial triage assessment does not replace that evaluation, and the importance of remaining in the ED until their evaluation is complete.    Victorino Dike, FNP 02/13/22 1356

## 2022-02-13 NOTE — ED Triage Notes (Signed)
Pt comes via EMS with c/o redness and spot on chest. Pt wants it looked at. Pt states it is painful. Pt states it has been there for 3 months.

## 2022-03-23 ENCOUNTER — Encounter: Payer: Self-pay | Admitting: Emergency Medicine

## 2022-03-23 ENCOUNTER — Other Ambulatory Visit: Payer: Self-pay

## 2022-03-23 ENCOUNTER — Emergency Department
Admission: EM | Admit: 2022-03-23 | Discharge: 2022-03-23 | Disposition: A | Discharge status: Home / Self Care | Payer: 59 | Attending: Emergency Medicine | Admitting: Emergency Medicine

## 2022-03-23 DIAGNOSIS — I4891 Unspecified atrial fibrillation: Secondary | ICD-10-CM | POA: Insufficient documentation

## 2022-03-23 DIAGNOSIS — C44509 Unspecified malignant neoplasm of skin of other part of trunk: Secondary | ICD-10-CM | POA: Diagnosis not present

## 2022-03-23 DIAGNOSIS — L989 Disorder of the skin and subcutaneous tissue, unspecified: Secondary | ICD-10-CM

## 2022-03-23 DIAGNOSIS — I10 Essential (primary) hypertension: Secondary | ICD-10-CM | POA: Diagnosis not present

## 2022-03-23 NOTE — ED Triage Notes (Signed)
Pt reports large mole on her chest was scratched and started light bleeding. Pt reports has an appt scheduled in [place] to get it removed but her brother wanted her to have it looked at since it was scratched.

## 2022-03-23 NOTE — ED Provider Notes (Signed)
   [facility] Provider Note    Event Date/Time   First MD Initiated Contact with Patient 03/23/22 [phone]     (approximate)   History   Abrasion   HPI  Tracy Dickerson is a 81 y.o. female with a history of HTN, HLD and A-fib presents to the ER via EMS, with complaint of an abnormal skin lesion to her left upper chest.  She reports this has been there for months.  She reports she accidentally scratched the area and it bled a little bit.  She was able to control the bleeding at home.  She denies any pain, redness, warmth or discharge from the area at this time.  She does have a follow-up appointment with dermatology scheduled in August for evaluation of the same.      Physical Exam   Triage Vital Signs: ED Triage Vitals  Enc Vitals Group     BP 03/23/22 0938 (!) 162/84     Pulse Rate 03/23/22 0938 86     Resp 03/23/22 0938 20     Temp 03/23/22 0938 98.2 F (36.8 C)     Temp Source 03/23/22 0938 Oral     SpO2 03/23/22 0938 94 %     Weight 03/23/22 0939 191 lb 12.8 oz (87 kg)     Height 03/23/22 0939 5\' 6"  (1.676 m)     Head Circumference --      Peak Flow --      Pain Score --      Pain Loc --      Pain Edu? --      Excl. in [place]? --     Most recent vital signs: Vitals:   03/23/22 0938  BP: (!) 162/84  Pulse: 86  Resp: 20  Temp: 98.2 F (36.8 C)  SpO2: 94%    General: Awake, no distress.  CV:  Normal rate with a regular rhythm. Resp:  Normal effort.  CTA bilaterally. Skin:  1.5 cm round, raised, red skin lesion noted of the left upper chest.  There is an area of excoriation noted about 5:00 with dried blood.  No active bleeding at this time.   ED Results / Procedures / Treatments    MEDICATIONS ORDERED IN ED: Medications - No data to display   IMPRESSION / MDM / [place] / ED COURSE  I reviewed the triage vital signs and the nursing notes.  Bleeding Skin Lesion Left Upper Chest:   Differential diagnosis includes, but is  not limited to, bleeding of abnormal skin lesion, infection of abnormal skin lesion.  Patient's presentation is most consistent with acute, uncomplicated illness.  Lesion is not currently bleeding at this time Advised her this skin lesion is concerning for a cancerous type lesion but she already has a follow-up scheduled with dermatology for further evaluation Advised her to clean the area daily with soap and water, pat dry and then cover with a Band-Aid to prevent further trauma She will follow-up with dermatology as an outpatient    FINAL CLINICAL IMPRESSION(S) / ED DIAGNOSES   Final diagnoses:  Skin lesion of chest wall     Rx / DC Orders   ED Discharge Orders     None        Note:  This document was prepared using Dragon voice recognition software and may include unintentional dictation errors.    Jearld Fenton, NP 03/23/22 1032    Naaman Plummer, MD 03/23/22 [phone]

## 2022-03-23 NOTE — ED Triage Notes (Signed)
Pt in via EMS from home with c/o abrasion. EMS reports pt has a mole on her left chest, she scratched it and it started bleeding and she wants a doctor to look at it.

## 2022-03-23 NOTE — Discharge Instructions (Addendum)
You were seen today for a bleeding skin lesion of your chest wall.  Try to keep the area covered with a Band-Aid to prevent further trauma.  If it bleeds again, please clean it with soap and water, pat it dry and covered with another Band-Aid.  Please follow-up with Dr. Gabrielle Dare in August as previously scheduled for further evaluation of the skin lesion.

## 2022-04-11 ENCOUNTER — Emergency Department
Admission: EM | Admit: 2022-04-11 | Discharge: 2022-04-11 | Disposition: A | Discharge status: Home / Self Care | Payer: 59 | Attending: Emergency Medicine | Admitting: Emergency Medicine

## 2022-04-11 ENCOUNTER — Other Ambulatory Visit: Payer: Self-pay

## 2022-04-11 DIAGNOSIS — D492 Neoplasm of unspecified behavior of bone, soft tissue, and skin: Secondary | ICD-10-CM

## 2022-04-11 DIAGNOSIS — L989 Disorder of the skin and subcutaneous tissue, unspecified: Secondary | ICD-10-CM | POA: Diagnosis present

## 2022-04-11 DIAGNOSIS — I10 Essential (primary) hypertension: Secondary | ICD-10-CM | POA: Diagnosis not present

## 2022-04-11 MED ORDER — CLINDAMYCIN HCL 300 MG PO CAPS: 300.0000 mg | ORAL_CAPSULE | Freq: Four times a day (QID) | ORAL | 0 refills | Status: AC

## 2022-04-11 NOTE — ED Triage Notes (Signed)
Pt comes into the ED via EMS from home for an abscess to the left chest for " a while"  155/71 HR94 92%RA

## 2022-04-11 NOTE — Discharge Instructions (Addendum)
-  Please follow-up with your dermatologist within the next few weeks, as discussed.  -Please take your antibiotics as prescribed.  You may continue to apply Neosporin to the growth and surrounding area daily as needed   -Return to the emergency department anytime if you begin to experience any new or worsening symptoms.

## 2022-04-11 NOTE — ED Provider Notes (Signed)
[facility] Provider Note    Event Date/Time   First MD Initiated Contact with Patient 04/11/22 1506     (approximate)   History   Chief Complaint Abscess (/)   HPI Tracy Dickerson is a 81 y.o. female, history of hyperlipidemia, PAF, hypertension, presents to the emergency department for evaluation of suspected abscess to the left side of her chest x3 months.  Patient states that has been growing gradually and bleeds intermittently.  Denies any known environmental exposures, bites, stings, or injuries to the area.  Denies fever/chills, chest pain, shortness of breath, abdominal pain, flank pain, nausea/vomiting, diarrhea, dizziness/lightheadedness, or purulent drainage.  History Limitations: No limitations.        Physical Exam  Triage Vital Signs: ED Triage Vitals  Enc Vitals Group     BP 04/11/22 1437 (!) 139/105     Pulse Rate 04/11/22 1437 85     Resp 04/11/22 1437 16     Temp 04/11/22 1437 97.7 F (36.5 C)     Temp Source 04/11/22 1437 Oral     SpO2 04/11/22 1437 99 %     Weight 04/11/22 1509 191 lb 12.8 oz (87 kg)     Height 04/11/22 1509 5\' 6"  (1.676 m)     Head Circumference --      Peak Flow --      Pain Score --      Pain Loc --      Pain Edu? --      Excl. in [place]? --     Most recent vital signs: Vitals:   04/11/22 1437  BP: (!) 139/105  Pulse: 85  Resp: 16  Temp: 97.7 F (36.5 C)  SpO2: 99%    General: Awake, NAD.  Skin: Warm, dry. No rashes or lesions.  Eyes: PERRL. Conjunctivae normal.  CV: Good peripheral perfusion.  Resp: Normal effort.  Abd: Soft, non-tender. No distention.  Neuro: At baseline. No gross neurological deficits.   Focused Exam: Nickel sized, fleshy growth on the left side of the chest wall, adjacent to the sternum along the second/third ribs.  Does not appear fluctuant.  Mild bleeding elicited with palpation of the growth.  There are some mild tenderness and erythema surrounding the growth as well.  No  other rashes or lesions present on the patient's skin.  Physical Exam    ED Results / Procedures / Treatments  Labs (all labs ordered are listed, but only abnormal results are displayed) Labs Reviewed - No data to display   EKG N/A.  N/A.   RADIOLOGY  ED Provider Interpretation: N/A.  No results found.  PROCEDURES:  Critical Care performed: N/A.  Procedures    MEDICATIONS ORDERED IN ED: Medications - No data to display   IMPRESSION / MDM / [place] / ED COURSE  I reviewed the triage vital signs and the nursing notes.                              Differential diagnosis includes, but is not limited to, cellulitis, abscess, basal cell carcinoma, squamous cell carcinoma, dermatofibroma, lipoma, cherry angioma.  Assessment/Plan Patient presents with slowly growing, nickel sized, fleshy lesion on the chest wall has been growing over the past few months.  There is some erythema and tenderness around the site, possibly suggestive of cellulitis, though patient appears clinically well and is not experiencing any fevers, chills, myalgias, or other systemic symptoms.  The lesion does not appear fluctuant or amenable to drainage.  She currently has an appointment scheduled with dermatology in a few weeks.  Very low suspicion for any serious or life-threatening pathology.  Physical exam not suggestive of melanoma.  We will go ahead and provide prescription to treat possible secondary cellulitis.  In the meantime, I believe patient will be safe until her appointment with dermatology in a few weeks.  We will plan to discharge.  Patient's presentation is most consistent with acute, uncomplicated illness.   Provided the patient with anticipatory guidance, return precautions, and educational material. Encouraged the patient to return to the emergency department at any time if they begin to experience any new or worsening symptoms. Patient expressed understanding and agreed with  the plan.       FINAL CLINICAL IMPRESSION(S) / ED DIAGNOSES   Final diagnoses:  Abnormal skin growth     Rx / DC Orders   ED Discharge Orders          Ordered    clindamycin (CLEOCIN) 300 MG capsule  4 times daily        04/11/22 1544             Note:  This document was prepared using Dragon voice recognition software and may include unintentional dictation errors.   Teodoro Spray, [place] 04/11/22 1548    Blake Divine, MD 04/11/22 [place]

## 2022-04-11 NOTE — ED Notes (Signed)
See triage note  Presents with possible abscess area to left upper chest   States area ha been then for about 4 months  States area has been bleeding occasionally

## 2022-05-07 ENCOUNTER — Ambulatory Visit: Payer: Medicare Other | Admitting: Dermatology

## 2022-06-10 ENCOUNTER — Emergency Department
Admission: EM | Admit: 2022-06-10 | Discharge: 2022-06-10 | Disposition: A | Discharge status: Home / Self Care | Payer: 59 | Attending: Emergency Medicine | Admitting: Emergency Medicine

## 2022-06-10 ENCOUNTER — Emergency Department: Payer: 59

## 2022-06-10 ENCOUNTER — Other Ambulatory Visit: Payer: Self-pay

## 2022-06-10 DIAGNOSIS — M79601 Pain in right arm: Secondary | ICD-10-CM | POA: Diagnosis present

## 2022-06-10 NOTE — ED Triage Notes (Signed)
Pt comes with c/o right arm pain since getting flu shot. Pt states 10/10 pain.   CBG-118 98.2 97% RA BP-118/82

## 2022-06-10 NOTE — ED Notes (Signed)
US at bedside

## 2022-06-10 NOTE — ED Provider Notes (Signed)
[facility] Provider Note  Patient Contact: 3:31 PM (approximate)   History   Arm Pain   HPI  Tracy Dickerson is a 81 y.o. female who presents to the emergency department complaining of right arm pain.  Patient states that she got a shingles vaccine and a flu vaccine and both of her shoulders at the same time.  Left shoulder with the shingles vaccine had no pain.  Right has been painful.  Patient states that she is concerned she could have a blood clot as she has a history of A-fib.  However pain started within a few hours of the shot and has primarily involved the deltoid region.  No fevers or chills.  No other complaints.     Physical Exam   Triage Vital Signs: ED Triage Vitals  Enc Vitals Group     BP 06/10/22 1437 (!) 141/76     Pulse Rate 06/10/22 1437 100     Resp 06/10/22 1437 17     Temp 06/10/22 1437 97.8 F (36.6 C)     Temp src --      SpO2 06/10/22 1437 95 %     Weight 06/10/22 1528 191 lb 12.8 oz (87 kg)     Height 06/10/22 1528 5\' 6"  (1.676 m)     Head Circumference --      Peak Flow --      Pain Score 06/10/22 1433 10     Pain Loc --      Pain Edu? --      Excl. in [place]? --     Most recent vital signs: Vitals:   06/10/22 1437  BP: (!) 141/76  Pulse: 100  Resp: 17  Temp: 97.8 F (36.6 C)  SpO2: 95%     General: Alert and in no acute distress.  Cardiovascular:  Good peripheral perfusion Respiratory: Normal respiratory effort without tachypnea or retractions. Lungs CTAB.  Musculoskeletal: Full range of motion to all extremities.  Visual station of the right upper extremity reveals no gross edema, erythema.  Patient is tender to the lateral aspect of the humerus.  No palpable abnormality.  No evidence of cellulitic changes.  Pulse and sensation intact distally. Neurologic:  No gross focal neurologic deficits are appreciated.  Skin:   No rash noted Other:   ED Results / Procedures / Treatments   Labs (all labs ordered are  listed, but only abnormal results are displayed) Labs Reviewed - No data to display   EKG     RADIOLOGY  I personally viewed, evaluated, and interpreted these images as part of my medical decision making, as well as reviewing the written report by the radiologist.  ED Provider Interpretation: No evidence of DVT on ultrasound  US Venous Img Upper Uni Right(DVT)  Result Date: 06/10/2022 CLINICAL DATA:  81 year old female with arm pain after flu shot. EXAM: RIGHT UPPER EXTREMITY VENOUS DOPPLER ULTRASOUND TECHNIQUE: Gray-scale sonography with graded compression, as well as color Doppler and duplex ultrasound were performed to evaluate the upper extremity deep venous system from the level of the subclavian vein and including the jugular, axillary, basilic, radial, ulnar and upper cephalic vein. Spectral Doppler was utilized to evaluate flow at rest and with distal augmentation maneuvers. COMPARISON:  None Available. FINDINGS: Contralateral Subclavian Vein: Respiratory phasicity is normal and symmetric with the symptomatic side. No evidence of thrombus. Normal compressibility. Internal Jugular Vein: No evidence of thrombus. Normal compressibility, respiratory phasicity and response to augmentation. Subclavian Vein: No evidence  of thrombus. Normal compressibility, respiratory phasicity and response to augmentation. Axillary Vein: No evidence of thrombus. Normal compressibility, respiratory phasicity and response to augmentation. Cephalic Vein: No evidence of thrombus. Normal compressibility, respiratory phasicity and response to augmentation. Basilic Vein: No evidence of thrombus. Normal compressibility, respiratory phasicity and response to augmentation. Brachial Veins: No evidence of thrombus. Normal compressibility, respiratory phasicity and response to augmentation. Radial Veins: No evidence of thrombus. Normal compressibility, respiratory phasicity and response to augmentation. Ulnar Veins: No  evidence of thrombus. Normal compressibility, respiratory phasicity and response to augmentation. Other Findings:  None visualized. IMPRESSION: No evidence of right upper extremity venous thrombosis. Ruthann Cancer, MD [facility]iology Electronically Signed   By: Ruthann Cancer M.D.   On: 06/10/2022 16:13    PROCEDURES:  Critical Care performed: No  Procedures   MEDICATIONS ORDERED IN ED: Medications - No data to display   IMPRESSION / MDM / [place] / ED COURSE  I reviewed the triage vital signs and the nursing notes.                              Differential diagnosis includes, but is not limited to, muscle pain secondary to flu shot, DVT, hematoma, abscess, cellulitis  Patient's presentation is most consistent with acute presentation with potential threat to life or bodily function.   Patient's diagnosis is consistent with arm pain.  Patient presented to the emergency department complaining of right arm pain after receiving her flu shot.  Patient was concerned that she could have a blood clot as she has a history of A-fib but does take blood thinners.  No significant concerning physical exam findings.  Ultrasound revealed no evidence of DVT.  Given the close proximity of pain to the patient's flu shot I suspect that this is secondary to her flu shot.  No concerning findings for infection.  At this time patient may take Tylenol at home for pain.  Follow-up primary care as needed.  No indication for further work-up..  Patient is given ED precautions to return to the ED for any worsening or new symptoms.        FINAL CLINICAL IMPRESSION(S) / ED DIAGNOSES   Final diagnoses:  Right arm pain     Rx / DC Orders   ED Discharge Orders     None        Note:  This document was prepared using Dragon voice recognition software and may include unintentional dictation errors.   Brynda Peon 06/10/22  1706    Carrie Mew, MD 06/11/22 [phone]

## 2022-10-20 ENCOUNTER — Other Ambulatory Visit: Payer: Self-pay

## 2022-10-20 ENCOUNTER — Emergency Department: Payer: 59

## 2022-10-20 ENCOUNTER — Emergency Department
Admission: EM | Admit: 2022-10-20 | Discharge: 2022-10-20 | Disposition: A | Discharge status: Home / Self Care | Payer: 59 | Attending: Emergency Medicine | Admitting: Emergency Medicine

## 2022-10-20 DIAGNOSIS — Z85118 Personal history of other malignant neoplasm of bronchus and lung: Secondary | ICD-10-CM | POA: Insufficient documentation

## 2022-10-20 DIAGNOSIS — W01198A Fall on same level from slipping, tripping and stumbling with subsequent striking against other object, initial encounter: Secondary | ICD-10-CM | POA: Insufficient documentation

## 2022-10-20 DIAGNOSIS — I10 Essential (primary) hypertension: Secondary | ICD-10-CM | POA: Insufficient documentation

## 2022-10-20 DIAGNOSIS — S0003XA Contusion of scalp, initial encounter: Secondary | ICD-10-CM | POA: Diagnosis present

## 2022-10-20 DIAGNOSIS — E119 Type 2 diabetes mellitus without complications: Secondary | ICD-10-CM | POA: Insufficient documentation

## 2022-10-20 DIAGNOSIS — W19XXXA Unspecified fall, initial encounter: Secondary | ICD-10-CM

## 2022-10-20 NOTE — ED Provider Notes (Signed)
[facility] Provider Note    Event Date/Time   First MD Initiated Contact with Patient 10/20/22 1410     (approximate)   History   Fall   HPI  Tracy Dickerson is a 82 y.o. female past medical history of diabetes hypertension, breast and lung cancer who presents after a fall.  Patient was in [place] when she slipped on water falling back hitting her head.  Did not lose consciousness.  She endorses pain over hematoma on the left posterior occiput otherwise denies vision change numbness tingling weakness denies neck pain chest pain abdominal pain she has been able to ambulate since this happened.  Denies being on blood thinners.  Otherwise been feeling well she is no complaints.     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 ([place]) 08/13/2020   PAF (paroxysmal atrial fibrillation) ([place]) 08/13/2020   HTN (hypertension) 08/13/2020   Dehydration    Dehydration, moderate    Acute renal failure ([place]) 04/22/2020   Hyperkalemia    Septic shock ([place])    Sepsis due to urinary tract infection ([place]) 03/13/2018   Acute kidney injury superimposed on chronic kidney disease ([place]) 02/13/2018   Closed extra-articular fracture of distal tibia 02/02/2018   Pneumonia 10/29/2017   Hypotension 01/26/2017     Physical Exam  Triage Vital Signs: ED Triage Vitals  Enc Vitals Group     BP 10/20/22 1344 114/65     Pulse Rate 10/20/22 1344 99     Resp 10/20/22 1344 18     Temp 10/20/22 1344 98 F (36.7 C)     Temp src --      SpO2 10/20/22 1344 94 %     Weight --      Height --      Head Circumference  --      Peak Flow --      Pain Score 10/20/22 1343 8     Pain Loc --      Pain Edu? --      Excl. in [place]? --     Most recent vital signs: Vitals:   10/20/22 1344  BP: 114/65  Pulse: 99  Resp: 18  Temp: 98 F (36.7 C)  SpO2: 94%     General: Awake, no distress.  CV:  Good peripheral perfusion.  Resp:  Normal effort.  Abd:  No distention.  Neuro:             Awake, Alert, Oriented x 3  Other:  Hematoma over the left posterior occiput mildly tender to palpation, no overlying laceration No other signs of trauma to the head or neck, no midline C-spine tenderness No chest wall tenderness Abdomen soft nontender Pelvis stable nontender able to range hips bilaterally   ED Results / Procedures / Treatments  Labs (all labs ordered are listed, but only abnormal results are displayed) Labs Reviewed - No data to display   EKG     RADIOLOGY I reviewed and interpreted the CT scan of the brain which does not show any acute intracranial process  PROCEDURES:  Critical Care performed: No  Procedures   MEDICATIONS ORDERED IN ED: Medications - No data to display   IMPRESSION / MDM / [place] / ED COURSE  I reviewed the triage vital signs and the nursing notes.                              Patient's presentation is most consistent with acute presentation with potential threat to life or bodily function.  Differential diagnosis includes, but is not limited to, intracranial hemorrhage, skull fracture, cervical spine fracture, concussion  Patient is a 82 year old female who presents after mechanical fall.  She is slipped in water at [facility] falling backward hitting her head and lost consciousness.  She is not anticoagulated.  Her only complaint is swelling over the posterior occiput which is painful denies other extremity chest or abdominal pain.  Has been able to ambulate since this happened.  On exam she does have a posterior occipital hematoma is  neurologically intact with normal mental status no other signs of trauma on exam.  CT head shows hematoma but no skull fracture no underlying injury to the brain.  Cervical spine CT is negative.  Patient otherwise without complaints and this was mechanical in nature.  She is appropriate for discharge.  We discussed icing and Tylenol for pain.       FINAL CLINICAL IMPRESSION(S) / ED DIAGNOSES   Final diagnoses:  Fall, initial encounter  Hematoma of scalp, initial encounter     Rx / DC Orders   ED Discharge Orders     None        Note:  This document was prepared using Dragon voice recognition software and may include unintentional dictation errors.   Rada Hay, MD 10/20/22 [phone]

## 2022-10-20 NOTE — ED Triage Notes (Signed)
Pt comes via EMS form [facility] after she slipped on the floor. Pt did hit back of head. No loc or thinners. Pt states headache.

## 2022-10-20 NOTE — ED Notes (Signed)
Pt to CT at this time.

## 2022-10-20 NOTE — ED Triage Notes (Signed)
First Nurse Note:  Pt via EMS from [place]. Pt slipped on the floor and fell. Pt did hit her head. Denies LOC. Denies blood thinners. Pt c/o headache, pt has a hematoma to the posterior aspect of the head. Pt is A&Ox4 and NAD 135 CBG  81 HR  97%  120/81

## 2023-01-05 ENCOUNTER — Emergency Department: Payer: 59

## 2023-01-05 ENCOUNTER — Inpatient Hospital Stay
Admission: EM | Admit: 2023-01-05 | Discharge: 2023-01-07 | DRG: 378 | Disposition: A | Discharge status: Home / Self Care | Payer: 59 | Attending: Internal Medicine | Admitting: Internal Medicine

## 2023-01-05 ENCOUNTER — Other Ambulatory Visit: Payer: Self-pay

## 2023-01-05 DIAGNOSIS — Z7951 Long term (current) use of inhaled steroids: Secondary | ICD-10-CM

## 2023-01-05 DIAGNOSIS — E876 Hypokalemia: Secondary | ICD-10-CM | POA: Diagnosis present

## 2023-01-05 DIAGNOSIS — Z85118 Personal history of other malignant neoplasm of bronchus and lung: Secondary | ICD-10-CM | POA: Diagnosis not present

## 2023-01-05 DIAGNOSIS — J449 Chronic obstructive pulmonary disease, unspecified: Secondary | ICD-10-CM | POA: Insufficient documentation

## 2023-01-05 DIAGNOSIS — Z5919 Other inadequate housing: Secondary | ICD-10-CM | POA: Diagnosis not present

## 2023-01-05 DIAGNOSIS — Z7901 Long term (current) use of anticoagulants: Secondary | ICD-10-CM | POA: Diagnosis not present

## 2023-01-05 DIAGNOSIS — I10 Essential (primary) hypertension: Secondary | ICD-10-CM | POA: Diagnosis present

## 2023-01-05 DIAGNOSIS — T45515A Adverse effect of anticoagulants, initial encounter: Secondary | ICD-10-CM | POA: Diagnosis present

## 2023-01-05 DIAGNOSIS — E663 Overweight: Secondary | ICD-10-CM | POA: Diagnosis present

## 2023-01-05 DIAGNOSIS — K219 Gastro-esophageal reflux disease without esophagitis: Secondary | ICD-10-CM | POA: Diagnosis present

## 2023-01-05 DIAGNOSIS — Z885 Allergy status to narcotic agent status: Secondary | ICD-10-CM | POA: Diagnosis not present

## 2023-01-05 DIAGNOSIS — Z6827 Body mass index (BMI) 27.0-27.9, adult: Secondary | ICD-10-CM | POA: Diagnosis not present

## 2023-01-05 DIAGNOSIS — N182 Chronic kidney disease, stage 2 (mild): Secondary | ICD-10-CM | POA: Diagnosis not present

## 2023-01-05 DIAGNOSIS — N1831 Chronic kidney disease, stage 3a: Secondary | ICD-10-CM | POA: Diagnosis present

## 2023-01-05 DIAGNOSIS — R195 Other fecal abnormalities: Secondary | ICD-10-CM | POA: Diagnosis present

## 2023-01-05 DIAGNOSIS — N183 Chronic kidney disease, stage 3 unspecified: Secondary | ICD-10-CM | POA: Insufficient documentation

## 2023-01-05 DIAGNOSIS — R1084 Generalized abdominal pain: Secondary | ICD-10-CM

## 2023-01-05 DIAGNOSIS — I129 Hypertensive chronic kidney disease with stage 1 through stage 4 chronic kidney disease, or unspecified chronic kidney disease: Secondary | ICD-10-CM | POA: Diagnosis present

## 2023-01-05 DIAGNOSIS — Z87891 Personal history of nicotine dependence: Secondary | ICD-10-CM | POA: Diagnosis not present

## 2023-01-05 DIAGNOSIS — Z7984 Long term (current) use of oral hypoglycemic drugs: Secondary | ICD-10-CM

## 2023-01-05 DIAGNOSIS — Z880 Allergy status to penicillin: Secondary | ICD-10-CM

## 2023-01-05 DIAGNOSIS — Z853 Personal history of malignant neoplasm of breast: Secondary | ICD-10-CM | POA: Diagnosis not present

## 2023-01-05 DIAGNOSIS — I480 Paroxysmal atrial fibrillation: Secondary | ICD-10-CM | POA: Diagnosis present

## 2023-01-05 DIAGNOSIS — K922 Gastrointestinal hemorrhage, unspecified: Secondary | ICD-10-CM | POA: Diagnosis present

## 2023-01-05 DIAGNOSIS — E785 Hyperlipidemia, unspecified: Secondary | ICD-10-CM | POA: Diagnosis present

## 2023-01-05 DIAGNOSIS — E1169 Type 2 diabetes mellitus with other specified complication: Secondary | ICD-10-CM | POA: Diagnosis present

## 2023-01-05 DIAGNOSIS — Z886 Allergy status to analgesic agent status: Secondary | ICD-10-CM | POA: Diagnosis not present

## 2023-01-05 DIAGNOSIS — I482 Chronic atrial fibrillation, unspecified: Secondary | ICD-10-CM

## 2023-01-05 DIAGNOSIS — K920 Hematemesis: Principal | ICD-10-CM

## 2023-01-05 DIAGNOSIS — Z1152 Encounter for screening for COVID-19: Secondary | ICD-10-CM | POA: Diagnosis not present

## 2023-01-05 DIAGNOSIS — D6832 Hemorrhagic disorder due to extrinsic circulating anticoagulants: Secondary | ICD-10-CM | POA: Diagnosis present

## 2023-01-05 DIAGNOSIS — Z79899 Other long term (current) drug therapy: Secondary | ICD-10-CM | POA: Diagnosis not present

## 2023-01-05 DIAGNOSIS — K226 Gastro-esophageal laceration-hemorrhage syndrome: Secondary | ICD-10-CM

## 2023-01-05 DIAGNOSIS — E1122 Type 2 diabetes mellitus with diabetic chronic kidney disease: Secondary | ICD-10-CM | POA: Diagnosis present

## 2023-01-05 LAB — COMPREHENSIVE METABOLIC PANEL
ALT: 9 U/L (ref 0–44)
AST: 19 U/L (ref 15–41)
Albumin: 3.3 g/dL — ABNORMAL LOW (ref 3.5–5.0)
Alkaline Phosphatase: 79 U/L (ref 38–126)
Anion gap: 10 (ref 5–15)
BUN: 12 mg/dL (ref 8–23)
CO2: 25 mmol/L (ref 22–32)
Calcium: 8.7 mg/dL — ABNORMAL LOW (ref 8.9–10.3)
Chloride: 108 mmol/L (ref 98–111)
Creatinine, Ser: 1.03 mg/dL — ABNORMAL HIGH (ref 0.44–1.00)
GFR, Estimated: 55 mL/min — ABNORMAL LOW (ref >60)
Glucose, Bld: 114 mg/dL — ABNORMAL HIGH (ref 70–99)
Potassium: 3.7 mmol/L (ref 3.5–5.1)
Sodium: 143 mmol/L (ref 135–145)
Total Bilirubin: 0.7 mg/dL (ref 0.3–1.2)
Total Protein: 6.6 g/dL (ref 6.5–8.1)

## 2023-01-05 LAB — CBC WITH DIFFERENTIAL/PLATELET
Abs Immature Granulocytes: 0.03 10*3/uL (ref 0.00–0.07)
Basophils Absolute: 0.1 10*3/uL (ref 0.0–0.1)
Basophils Relative: 1 %
Eosinophils Absolute: 0.4 10*3/uL (ref 0.0–0.5)
Eosinophils Relative: 5 %
HCT: 37.9 % (ref 36.0–46.0)
Hemoglobin: 12.4 g/dL (ref 12.0–15.0)
Immature Granulocytes: 0 %
Lymphocytes Relative: 24 %
Lymphs Abs: 1.7 10*3/uL (ref 0.7–4.0)
MCH: 30.1 pg (ref 26.0–34.0)
MCHC: 32.7 g/dL (ref 30.0–36.0)
MCV: 92 fL (ref 80.0–100.0)
Monocytes Absolute: 0.5 10*3/uL (ref 0.1–1.0)
Monocytes Relative: 7 %
Neutro Abs: 4.5 10*3/uL (ref 1.7–7.7)
Neutrophils Relative %: 63 %
Platelets: 232 10*3/uL (ref 150–400)
RBC: 4.12 MIL/uL (ref 3.87–5.11)
RDW: 14.6 % (ref 11.5–15.5)
WBC: 7.2 10*3/uL (ref 4.0–10.5)
nRBC: 0 % (ref 0.0–0.2)

## 2023-01-05 LAB — URINALYSIS, ROUTINE W REFLEX MICROSCOPIC
Bilirubin Urine: NEGATIVE
Glucose, UA: NEGATIVE mg/dL
Hgb urine dipstick: NEGATIVE
Ketones, ur: NEGATIVE mg/dL
Nitrite: NEGATIVE
Protein, ur: NEGATIVE mg/dL
Specific Gravity, Urine: 1.033 — ABNORMAL HIGH (ref 1.005–1.030)
WBC, UA: >50 WBC/hpf (ref 0–5)
pH: 7 (ref 5.0–8.0)

## 2023-01-05 LAB — HEMOGLOBIN AND HEMATOCRIT, BLOOD
HCT: 37.9 % (ref 36.0–46.0)
HCT: 41.7 % (ref 36.0–46.0)
Hemoglobin: 12 g/dL (ref 12.0–15.0)
Hemoglobin: 13.6 g/dL (ref 12.0–15.0)

## 2023-01-05 LAB — PROTIME-INR
INR: 1.3 — ABNORMAL HIGH (ref 0.8–1.2)
Prothrombin Time: 15.9 seconds — ABNORMAL HIGH (ref 11.4–15.2)

## 2023-01-05 LAB — MAGNESIUM: Magnesium: 2.1 mg/dL (ref 1.7–2.4)

## 2023-01-05 MED ORDER — HYDRALAZINE HCL 20 MG/ML IJ SOLN
10.0000 mg | INTRAMUSCULAR | Status: DC | PRN
  Administered 2023-01-05: 10 mg via INTRAVENOUS
  Filled 2023-01-05: qty 1

## 2023-01-05 MED ORDER — SODIUM CHLORIDE 0.9 % IV SOLN: INTRAVENOUS | Status: DC

## 2023-01-05 MED ORDER — ALBUTEROL SULFATE (2.5 MG/3ML) 0.083% IN NEBU: 3.0000 mL | INHALATION_SOLUTION | RESPIRATORY_TRACT | Status: DC | PRN

## 2023-01-05 MED ORDER — PANTOPRAZOLE SODIUM 40 MG IV SOLR: 40.0000 mg | Freq: Two times a day (BID) | INTRAVENOUS | Status: DC

## 2023-01-05 MED ORDER — PANTOPRAZOLE INFUSION (NEW) - SIMPLE MED
8.0000 mg/h | INTRAVENOUS | Status: DC
  Administered 2023-01-05 – 2023-01-07 (×5): 8 mg/h via INTRAVENOUS
  Filled 2023-01-05 (×4): qty 100

## 2023-01-05 MED ORDER — ONDANSETRON HCL 4 MG/2ML IJ SOLN: 4.0000 mg | Freq: Four times a day (QID) | INTRAMUSCULAR | Status: DC | PRN

## 2023-01-05 MED ORDER — ONDANSETRON HCL 4 MG PO TABS: 4.0000 mg | ORAL_TABLET | Freq: Four times a day (QID) | ORAL | Status: DC | PRN

## 2023-01-05 MED ORDER — ONDANSETRON HCL 4 MG/2ML IJ SOLN: 4.0000 mg | Freq: Once | INTRAMUSCULAR | Status: DC

## 2023-01-05 MED ORDER — TIOTROPIUM BROMIDE MONOHYDRATE 18 MCG IN CAPS
1.0000 | ORAL_CAPSULE | Freq: Every day | RESPIRATORY_TRACT | Status: DC
  Administered 2023-01-05 – 2023-01-06 (×2): 18 ug via RESPIRATORY_TRACT
  Filled 2023-01-05: qty 5

## 2023-01-05 MED ORDER — IOHEXOL 300 MG/ML  SOLN
100.0000 mL | Freq: Once | INTRAMUSCULAR | Status: AC | PRN
  Administered 2023-01-05: 100 mL via INTRAVENOUS

## 2023-01-05 MED ORDER — PANTOPRAZOLE SODIUM 40 MG IV SOLR
40.0000 mg | Freq: Once | INTRAVENOUS | Status: AC
  Administered 2023-01-05: 40 mg via INTRAVENOUS
  Filled 2023-01-05: qty 10

## 2023-01-05 NOTE — Assessment & Plan Note (Signed)
Recurrent bloody emesis x 1 to 2 days in the setting of Eliquis use Hemoglobin 12.4 Hold Eliquis IV PPI Dr. Tobi Bastos with gastroenterology aware case per ER physician Trend hemoglobin Transfuse for hemoglobin less than 7 Follow

## 2023-01-05 NOTE — Assessment & Plan Note (Signed)
Creatinine 1 today with GFR in the 50s Monitor

## 2023-01-05 NOTE — Progress Notes (Signed)
Pt arrived to room 208 via bed from the ED.

## 2023-01-05 NOTE — ED Triage Notes (Signed)
EMS called due to pt vomiting on Friday and states there was blood. Pt states she has abd pain. Bed begs and lice noticed by EMS and ER staff.

## 2023-01-05 NOTE — ED Notes (Signed)
Advised nurse that patient has ready bed 

## 2023-01-05 NOTE — Assessment & Plan Note (Signed)
Noted poor living conditions by EMS with active severe bedbug infestation Contact precautions Social work consult Follow

## 2023-01-05 NOTE — H&P (Signed)
History and Physical    Patient: Tracy Dickerson EAV:[id number] DOB: 1941-09-22 DOA: 01/05/2023 DOS: the patient was seen and examined on 01/05/2023 PCP: Dwana Melena, PA  Patient coming from: Home  Chief Complaint:  Chief Complaint  Patient presents with   Emesis    States there was blood.    HPI: Tracy Dickerson is a 82 y.o. female with medical history significant of atrial fibrillation, COPD, hypertension, hyperlipidemia, GERD history of breast cancer presenting with upper GI bleed, poor living conditions.  Patient reports recurrent bloody emesis x 2 days.  Denies any prior episodes like this in the past.  Has been baseline atrial fibrillation to which she takes Eliquis chronically.  No recent changes in dosing.  No reported NSAID use.  No fevers or chills.  No chest pain or shortness of breath.  No abdominal pain.  No reported trauma.  Had recurring episodes of bloody emesis.  Mild weakness.  No hemiparesis or confusion.  No reported alcohol use. Presented to the ER afebrile, hemodynamically stable.  White count 7.2, hemoglobin 12.4, platelets 232, creatinine 1.03, INR 1.3.  CT of the abdomen pelvis grossly stable. Review of Systems: As mentioned in the history of present illness. All other systems reviewed and are negative. Past Medical History:  Diagnosis Date   Cancer    Breast and lung   Diabetes mellitus without complication    GERD (gastroesophageal reflux disease)    Heart disease    Hypertension    Past Surgical History:  Procedure Laterality Date   ABDOMINAL HYSTERECTOMY     APPENDECTOMY     BREAST SURGERY     breast cancer LEFT   OPEN REDUCTION INTERNAL FIXATION (ORIF) TIBIA/FIBULA FRACTURE Left 02/03/2018   Procedure: OPEN REDUCTION INTERNAL FIXATION (ORIF) DISTAL TIBIA FRACTURE;  Surgeon: Christena Flake, MD;  Location: [facility] ORS;  Service: Orthopedics;  Laterality: Left;  ankle/lower leg   TONSILLECTOMY     Social History:  reports that she has quit smoking. She has  never used smokeless tobacco. She reports that she does not currently use alcohol. She reports that she does not use drugs.  Allergies  Allergen Reactions   Oxycodone    Percocet [Oxycodone-Acetaminophen] Itching   Penicillins Rash    Has patient had a PCN reaction causing immediate rash, facial/tongue/throat swelling, SOB or lightheadedness with hypotension: Yes Has patient had a PCN reaction causing severe rash involving mucus membranes or skin necrosis: Unknown Has patient had a PCN reaction that required hospitalization: Unknown Has patient had a PCN reaction occurring within the last 10 years: No If all of the above answers are "NO", then may proceed with Cephalosporin use.    Family History  Problem Relation Age of Onset   Cancer Mother     Prior to Admission medications   Medication Sig Start Date End Date Taking? Authorizing Provider  acetaminophen (TYLENOL) 500 MG tablet Take 500 mg by mouth every 6 (six) hours as needed.    [provider]  albuterol (PROAIR HFA) 108 (90 Base) MCG/ACT inhaler Inhale 2 puffs into the lungs every 4 (four) hours as needed for wheezing or shortness of breath.     [provider]  apixaban (ELIQUIS) 5 MG TABS tablet Take 5 mg by mouth 2 (two) times daily. 06/17/18   [provider]  atorvastatin (LIPITOR) 20 MG tablet Take by mouth. 05/01/20   [provider]  dicyclomine (BENTYL) 10 MG capsule Take 1 capsule (10 mg total) by mouth  4 (four) times daily for 14 days. 05/24/21 06/07/21  [name], Delorise Royals, PA-C  diphenhydramine-acetaminophen (TYLENOL PM) 25-500 MG TABS tablet Take 1-2 tablets by mouth at bedtime as needed (pain or sleep).     [provider]  gabapentin (NEURONTIN) 300 MG capsule Take 2 caspules by mouth at bedtime    [provider]  lisinopril (ZESTRIL) 10 MG tablet Take 10 mg by mouth daily. 06/28/20   [provider]  loperamide (IMODIUM A-D) 2 MG tablet Take 1 tablet (2 mg  total) by mouth 4 (four) times daily as needed for diarrhea or loose stools. 05/24/21   [name], Delorise Royals, PA-C  metFORMIN (GLUCOPHAGE) 1000 MG tablet Take 1,000 mg by mouth 2 (two) times daily with a meal.    [provider]  ondansetron (ZOFRAN ODT) 4 MG disintegrating tablet Take 1 tablet (4 mg total) by mouth every 8 (eight) hours as needed. 06/25/21   Menshew, Charlesetta Ivory, PA-C  polyethylene glycol (MIRALAX / GLYCOLAX) 17 g packet Take 17 g by mouth daily. 08/16/20   Delfino Lovett, MD  senna-docusate (SENOKOT-S) 8.6-50 MG tablet Take 2 tablets by mouth at bedtime as needed for mild constipation. 08/15/20   Delfino Lovett, MD  SPIRIVA HANDIHALER 18 MCG inhalation capsule Place 1 capsule (18 mcg total) into inhaler and inhale daily. 02/09/22   Orvil Feil, PA-C  traMADol (ULTRAM) 50 MG tablet Take 50 mg by mouth 2 (two) times daily as needed.     [provider]    Physical Exam: Vitals:   01/05/23 1145 01/05/23 1200 01/05/23 1215 01/05/23 1230  BP:  139/83  (!) 156/80  Pulse:  (!) 115 63 64  Resp:  18 19 18   Temp: (!) 97.5 F (36.4 C)     TempSrc: Oral     SpO2:  97% 95% 98%  Weight:      Height:       Physical Exam Constitutional:      Appearance: She is normal weight.  HENT:     Head: Normocephalic.     Nose: Nose normal.     Mouth/Throat:     Mouth: Mucous membranes are dry.  Eyes:     Pupils: Pupils are equal, round, and reactive to light.  Cardiovascular:     Rate and Rhythm: Normal rate and regular rhythm.  Pulmonary:     Effort: Pulmonary effort is normal.  Abdominal:     General: Bowel sounds are normal.  Musculoskeletal:        General: Normal range of motion.  Skin:    General: Skin is dry.  Neurological:     General: No focal deficit present.  Psychiatric:        Mood and Affect: Mood normal.     Data Reviewed:  There are no new results to review at this time. CT Abdomen Pelvis W Contrast CLINICAL DATA:  Acute non localized  abdominal pain.  EXAM: CT ABDOMEN AND PELVIS WITH CONTRAST  TECHNIQUE: Multidetector CT imaging of the abdomen and pelvis was performed using the standard protocol following bolus administration of intravenous contrast.  RADIATION DOSE REDUCTION: This exam was performed according to the departmental dose-optimization program which includes automated exposure control, adjustment of the mA and/or kV according to patient size and/or use of iterative reconstruction technique.  CONTRAST:  OMNIPAQUE IOHEXOL 300 MG/ML  SOLN  COMPARISON:  05/24/2021  FINDINGS: Lower Chest: No acute findings. Chronic interstitial lung disease again seen in both bases.  Hepatobiliary: No hepatic masses identified. Tiny sub-cm cyst in the anterior left hepatic lobe remains stable. Gallbladder is unremarkable. No evidence of biliary ductal dilatation.  Pancreas:  No mass or inflammatory changes.  Spleen: Within normal limits in size and appearance.  Adrenals/Urinary Tract: No suspicious masses identified. Multiple benign-appearing renal cysts are again seen bilaterally (no followup imaging is recommended). 7 mm nonobstructing right renal calculus again noted. No evidence of ureteral calculi or hydronephrosis.  Stomach/Bowel: No evidence of obstruction, inflammatory process or abnormal fluid collections. Diverticulosis is seen involving the sigmoid colon, however there is no evidence of diverticulitis.  Vascular/Lymphatic: No pathologically enlarged lymph nodes. Aortic atherosclerotic calcification incidentally noted. 3.0 cm infrarenal abdominal aortic aneurysm is now seen.  Reproductive: Prior hysterectomy noted. Adnexal regions are unremarkable in appearance.  Other:  None.  Musculoskeletal:  No suspicious bone lesions identified.  IMPRESSION: No acute findings within the abdomen or pelvis.  Mild sigmoid diverticulosis, without radiographic evidence of diverticulitis.  7 mm  nonobstructing right renal calculus.  3.0 cm infrarenal abdominal aortic aneurysm. Recommend follow-up every 3 years. This recommendation follows ACR consensus guidelines: White Paper of the ACR Incidental Findings Committee II on Vascular Findings. J Am Coll Radiol 2013; 10:789-794.  Aortic Atherosclerosis (ICD10-I70.0).  Electronically Signed   By: Danae Orleans M.D.   On: 01/05/2023 13:18  Lab Results  Component Value Date   WBC 7.2 01/05/2023   HGB 12.4 01/05/2023   HCT 37.9 01/05/2023   MCV 92.0 01/05/2023   PLT 232 01/05/2023   Last metabolic panel Lab Results  Component Value Date   GLUCOSE 114 (H) 01/05/2023   NA 143 01/05/2023   K 3.7 01/05/2023   CL 108 01/05/2023   CO2 25 01/05/2023   BUN 12 01/05/2023   CREATININE 1.03 (H) 01/05/2023   GFRNONAA 55 (L) 01/05/2023   CALCIUM 8.7 (L) 01/05/2023   PHOS 4.0 03/16/2018   PROT 6.6 01/05/2023   ALBUMIN 3.3 (L) 01/05/2023   BILITOT 0.7 01/05/2023   ALKPHOS 79 01/05/2023   AST 19 01/05/2023   ALT 9 01/05/2023   ANIONGAP 10 01/05/2023    Assessment and Plan: * UGIB (upper gastrointestinal bleed) Recurrent bloody emesis x 1 to 2 days in the setting of Eliquis use Hemoglobin 12.4 Hold Eliquis IV PPI Dr. Tobi Bastos with gastroenterology aware case per ER physician Trend hemoglobin Transfuse for hemoglobin less than 7 Follow   PAF (paroxysmal atrial fibrillation) Stable on presentation Hold Eliquis in the setting of upper GI bleeding Follow  Unsatisfactory living conditions Noted poor living conditions by EMS with active severe bedbug infestation Contact precautions Social work consult Follow  CKD (chronic kidney disease), stage III Creatinine 1 today with GFR in the 50s Monitor  HTN (hypertension) BP stable Titrate home regimen with GI bleeding      Advance Care Planning:   Code Status: Full Code   Consults: Dr. Tobi Bastos w/ gastroenterology per ER physician   Family Communication: No family at the  bedside   Severity of Illness: The appropriate patient status for this patient is INPATIENT. Inpatient status is judged to be reasonable and necessary in order to provide the required intensity of service to ensure the patient's safety. The patient's presenting symptoms, physical exam findings, and initial radiographic and laboratory data in the context of their chronic comorbidities is felt to place them at high risk for further clinical deterioration. Furthermore, it is not anticipated that the patient will be medically stable for discharge from the hospital within 2  midnights of admission.   * I certify that at the point of admission it is my clinical judgment that the patient will require inpatient hospital care spanning beyond 2 midnights from the point of admission due to high intensity of service, high risk for further deterioration and high frequency of surveillance required.*  Author: Floydene Flock, MD 01/05/2023 2:38 PM  For on call review [email or web address].

## 2023-01-05 NOTE — ED Provider Notes (Signed)
[facility] Provider Note    Event Date/Time   First MD Initiated Contact with Patient 01/05/23 1134     (approximate)   History   Chief Complaint Emesis (States there was blood. )   HPI  Tracy Dickerson is a 82 y.o. female with past medical history of hypertension, diabetes, atrial fibrillation on Eliquis, breast cancer, and lung cancer who presents to the ED complaining of nausea and vomiting.  Patient reports that she has been dealing with about 48 hours of persistent nausea and vomiting, denies any associated diarrhea or blood in her stool.  She does report that she has been having pain in her abdomen diffusely since the onset of vomiting, states it has been difficult for her to keep down either liquids or solids.  She denies any associated dysuria, hematuria, fever, or flank pain.  She denies any history of similar symptoms.  Patient reported to EMS that she has bedbugs at home.     Physical Exam   Triage Vital Signs: ED Triage Vitals [01/05/23 1137]  Enc Vitals Group     BP      Pulse      Resp      Temp      Temp src      SpO2 96 %     Weight      Height      Head Circumference      Peak Flow      Pain Score      Pain Loc      Pain Edu?      Excl. in GC?     Most recent vital signs: Vitals:   01/05/23 1200 01/05/23 1215  BP: 139/83   Pulse: (!) 115 63  Resp: 18 19  Temp:    SpO2: 97% 95%    Constitutional: Alert and oriented. Eyes: Conjunctivae are normal. Head: Atraumatic. Nose: No congestion/rhinnorhea. Mouth/Throat: Mucous membranes are moist.  Cardiovascular: Bradycardic, regular rhythm. Grossly normal heart sounds.  2+ radial pulses bilaterally. Respiratory: Normal respiratory effort.  No retractions. Lungs CTAB. Gastrointestinal: Soft and diffusely tender to palpation with no rebound or guarding. No distention. Musculoskeletal: No lower extremity tenderness nor edema.  Neurologic:  Normal speech and language. No gross  focal neurologic deficits are appreciated.    ED Results / Procedures / Treatments   Labs (all labs ordered are listed, but only abnormal results are displayed) Labs Reviewed  COMPREHENSIVE METABOLIC PANEL - Abnormal; Notable for the following components:      Result Value   Glucose, Bld 114 (*)    Creatinine, Ser 1.03 (*)    Calcium 8.7 (*)    Albumin 3.3 (*)    GFR, Estimated 55 (*)    All other components within normal limits  PROTIME-INR - Abnormal; Notable for the following components:   Prothrombin Time 15.9 (*)    INR 1.3 (*)    All other components within normal limits  CBC WITH DIFFERENTIAL/PLATELET  MAGNESIUM  URINALYSIS, ROUTINE W REFLEX MICROSCOPIC     EKG  ED ECG REPORT I, Chesley Noon, the attending physician, personally viewed and interpreted this ECG.   Date: 01/05/2023  EKG Time: 11:47  Rate: 72  Rhythm: atrial fibrillation  Axis: LAD  Intervals: Incomplete LBBB  ST&T Change: None  RADIOLOGY CT abdomen/pelvis reviewed and interpreted by me with no inflammatory changes, focal fluid collections, or dilated bowel loops.  PROCEDURES:  Critical Care performed: No  Procedures   MEDICATIONS  ORDERED IN ED: Medications  ondansetron (ZOFRAN) injection 4 mg (0 mg Intravenous Hold 01/05/23 1225)  pantoprazole (PROTONIX) injection 40 mg (40 mg Intravenous Given 01/05/23 1159)  iohexol (OMNIPAQUE) 300 MG/ML solution 100 mL (100 mLs Intravenous Contrast Given 01/05/23 1259)     IMPRESSION / MDM / ASSESSMENT AND PLAN / ED COURSE  I reviewed the triage vital signs and the nursing notes.                              82 y.o. female with past medical history of hypertension, diabetes, atrial fibrillation on Eliquis, GERD, breast cancer, and lung cancer who presents to the ED complaining of persistent nausea, vomiting, and diffuse abdominal pain for the past 2 days, noticed blood in her emesis earlier today.  Patient's presentation is most consistent with  acute presentation with potential threat to life or bodily function.  Differential diagnosis includes, but is not limited to, upper GI bleed, peptic ulcer disease, Mallory-Weiss tear, variceal bleed, pancreatitis, hepatitis, cholecystitis, biliary colic, dehydration, electrode abnormality, AKI.  Patient chronically ill-appearing but in no acute distress, vital signs remarkable for bradycardia but otherwise reassuring.  EKG shows sinus bradycardia with no ischemic changes, patient does not appear symptomatic related to bradycardia.  She has diffuse tenderness of her abdomen on exam, will further assess with CT scan.  She had 1 episode of hematemesis at home and has not noticed any blood in her stool, symptoms seem potentially consistent with Mallory-Weiss tear.  Low suspicion for variceal bleed given no history of liver disease, will give dose of IV Protonix for now.  CT imaging is negative for acute process, patient reports feeling better with no further vomiting here in the ED.  Labs are reassuring with stable hemoglobin, patient may have had small Mallory-Weiss tear no significant ongoing bleeding at this time.  She would still benefit from observation given she is anticoagulated on Eliquis.  No significant electrolyte abnormality or AKI noted, LFTs are unremarkable.  Case discussed with hospitalist for admission.      FINAL CLINICAL IMPRESSION(S) / ED DIAGNOSES   Final diagnoses:  Hematemesis with nausea  Mallory-Weiss tear  Generalized abdominal pain     Rx / DC Orders   ED Discharge Orders     None        Note:  This document was prepared using Dragon voice recognition software and may include unintentional dictation errors.   Chesley Noon, MD 01/05/23 [phone]

## 2023-01-05 NOTE — Assessment & Plan Note (Signed)
BP stable Titrate home regimen with GI bleeding

## 2023-01-05 NOTE — Assessment & Plan Note (Signed)
Stable on presentation Hold Eliquis in the setting of upper GI bleeding Follow

## 2023-01-06 DIAGNOSIS — E1169 Type 2 diabetes mellitus with other specified complication: Secondary | ICD-10-CM | POA: Insufficient documentation

## 2023-01-06 DIAGNOSIS — I480 Paroxysmal atrial fibrillation: Secondary | ICD-10-CM | POA: Diagnosis not present

## 2023-01-06 DIAGNOSIS — N182 Chronic kidney disease, stage 2 (mild): Secondary | ICD-10-CM | POA: Diagnosis not present

## 2023-01-06 DIAGNOSIS — J449 Chronic obstructive pulmonary disease, unspecified: Secondary | ICD-10-CM | POA: Insufficient documentation

## 2023-01-06 DIAGNOSIS — E663 Overweight: Secondary | ICD-10-CM

## 2023-01-06 DIAGNOSIS — K922 Gastrointestinal hemorrhage, unspecified: Secondary | ICD-10-CM | POA: Diagnosis not present

## 2023-01-06 DIAGNOSIS — E876 Hypokalemia: Secondary | ICD-10-CM | POA: Insufficient documentation

## 2023-01-06 LAB — COMPREHENSIVE METABOLIC PANEL
ALT: 9 U/L (ref 0–44)
AST: 14 U/L — ABNORMAL LOW (ref 15–41)
Albumin: 3.2 g/dL — ABNORMAL LOW (ref 3.5–5.0)
Alkaline Phosphatase: 71 U/L (ref 38–126)
Anion gap: 8 (ref 5–15)
BUN: 9 mg/dL (ref 8–23)
CO2: 24 mmol/L (ref 22–32)
Calcium: 8.5 mg/dL — ABNORMAL LOW (ref 8.9–10.3)
Chloride: 109 mmol/L (ref 98–111)
Creatinine, Ser: 0.91 mg/dL (ref 0.44–1.00)
GFR, Estimated: >60 mL/min (ref >60)
Glucose, Bld: 103 mg/dL — ABNORMAL HIGH (ref 70–99)
Potassium: 3 mmol/L — ABNORMAL LOW (ref 3.5–5.1)
Sodium: 141 mmol/L (ref 135–145)
Total Bilirubin: 0.9 mg/dL (ref 0.3–1.2)
Total Protein: 6.3 g/dL — ABNORMAL LOW (ref 6.5–8.1)

## 2023-01-06 LAB — HEMOGLOBIN AND HEMATOCRIT, BLOOD
HCT: 37.6 % (ref 36.0–46.0)
HCT: 32.3 % — ABNORMAL LOW (ref 36.0–46.0)
HCT: 36.1 % (ref 36.0–46.0)
Hemoglobin: 12.4 g/dL (ref 12.0–15.0)
Hemoglobin: 11.9 g/dL — ABNORMAL LOW (ref 12.0–15.0)
Hemoglobin: 12.2 g/dL (ref 12.0–15.0)

## 2023-01-06 MED ORDER — POTASSIUM CHLORIDE 10 MEQ/100ML IV SOLN
10.0000 meq | INTRAVENOUS | Status: AC
  Administered 2023-01-06 (×3): 10 meq via INTRAVENOUS
  Filled 2023-01-06 (×2): qty 100

## 2023-01-06 NOTE — Assessment & Plan Note (Signed)
Outpatient hemoglobin A1c is 5.4.  On Lipitor.

## 2023-01-06 NOTE — Assessment & Plan Note (Signed)
Slight orthostasis with standing.  Hold antihypertensive medication today and recheck tomorrow.

## 2023-01-06 NOTE — Evaluation (Signed)
Occupational Therapy Evaluation Patient Details Name: Tracy Dickerson MRN: [id number] DOB: 1941-08-11 Today's Date: 01/06/2023   History of Present Illness Pt admitted for UGIB. Orthostatics negative this AM. History includes Afib, COPD, HTN, HLD, breast cancer, and GERD.   Clinical Impression   Patient seen for OT evaluation and received sitting up in recliner. PTA pt lived with brother, was independent for ADLs, and reports shared responsibility for IADLs. Pt appears to be functioning at/near baseline level of function in ADLs. Pt agrees and denies any further ADL/IADL concerns. At this time, pt does not demonstrate any acute OT needs. Will complete orders.    Recommendations for follow up therapy are one component of a multi-disciplinary discharge planning process, led by the attending physician.  Recommendations may be updated based on patient status, additional functional criteria and insurance authorization.   Assistance Recommended at Discharge Set up Supervision/Assistance  Patient can return home with the following Assistance with cooking/housework;Assist for transportation;Help with stairs or ramp for entrance    Functional Status Assessment     Equipment Recommendations  None recommended by OT    Recommendations for Other Services       Precautions / Restrictions Precautions Precautions: Fall Restrictions Weight Bearing Restrictions: No      Mobility Bed Mobility       General bed mobility comments: NT, received/left in recliner    Transfers Overall transfer level: Modified independent Equipment used: Rolling walker (2 wheels)       General transfer comment: STS from recliner and toilet      Balance Overall balance assessment: Needs assistance Sitting-balance support: Feet supported Sitting balance-Leahy Scale: Good     Standing balance support: Bilateral upper extremity supported Standing balance-Leahy Scale: Good         ADL either performed or  assessed with clinical judgement   ADL Overall ADL's : At baseline         General ADL Comments: Pt completed functional mobility to the bathroom (~88ft) with supervision using a RW. Mod I for toilet transfer from regular height toilet. Supervision-Mod I for clothing management, peri care, and sinkside grooming.     Vision Patient Visual Report: No change from baseline       Perception     Praxis      Pertinent Vitals/Pain Pain Assessment Pain Assessment: No/denies pain     Hand Dominance Right   Extremity/Trunk Assessment Upper Extremity Assessment Upper Extremity Assessment: Overall WFL for tasks assessed   Lower Extremity Assessment Lower Extremity Assessment: Defer to PT evaluation       Communication Communication Communication: No difficulties   Cognition Arousal/Alertness: Awake/alert Behavior During Therapy: WFL for tasks assessed/performed Overall Cognitive Status: Within Functional Limits for tasks assessed         General Comments       Exercises Other Exercises Other Exercises: OT provided education re: role of OT, OT POC, post acute recs, sitting up for all meals, EOB/OOB mobility with assistance, home/fall safety.      Shoulder Instructions      Home Living Family/patient expects to be discharged to:: Private residence Living Arrangements: Other relatives (brother lives with her) Available Help at Discharge: Family Type of Home: House Home Access: Stairs to enter Secretary/administrator of Steps: 2 Entrance Stairs-Rails: Can reach both Home Layout: One level     Bathroom Shower/Tub: Chief Strategy Officer: Standard     Home Equipment: Agricultural consultant (2 wheels);Rollator (4 wheels);Cane - single point  Prior Functioning/Environment Prior Level of Function : History of Falls (last six months);Independent/Modified Independent             Mobility Comments: reports recent fall while using rollator ADLs  Comments: Pt is indep with all ADLs, however brother assist with outside chores including taking care of lawn        OT Problem List:        OT Treatment/Interventions:      OT Goals(Current goals can be found in the care plan section) Acute Rehab OT Goals Patient Stated Goal: return home OT Goal Formulation: All assessment and education complete, DC therapy  OT Frequency:      Co-evaluation              AM-PAC OT "6 Clicks" Daily Activity     Outcome Measure Help from another person eating meals?: None Help from another person taking care of personal grooming?: None Help from another person toileting, which includes using toliet, bedpan, or urinal?: A Little Help from another person bathing (including washing, rinsing, drying)?: None Help from another person to put on and taking off regular upper body clothing?: None Help from another person to put on and taking off regular lower body clothing?: None 6 Click Score: 23   End of Session Equipment Utilized During Treatment: Rolling walker (2 wheels) Nurse Communication: Mobility status  Activity Tolerance: Patient tolerated treatment well Patient left: in chair;with call bell/phone within reach;with chair alarm set  OT Visit Diagnosis: Other abnormalities of gait and mobility (R26.89)                Time: 8875-7972 OT Time Calculation (min): 10 min Charges:  OT General Charges $OT Visit: 1 Visit OT Evaluation $OT Eval Low Complexity: 1 Low  [facility] MS, OTR/L ascom [phone]  01/06/23, 12:39 PM

## 2023-01-06 NOTE — Evaluation (Signed)
Physical Therapy Evaluation Patient Details Name: Tracy Dickerson MRN: [id number] DOB: Sep 09, 1941 Today's Date: 01/06/2023  History of Present Illness  Pt admitted for UGIB. Orthostatics negative this AM. History includes Afib, COPD, HTN, HLD, breast ca, and GERD.  Clinical Impression  Pt is a pleasant 82 year old female who was admitted for upper GI bleed. Pt performs bed mobility with indep, transfers with mod I, and ambulation with cga and RW. Pt demonstrates deficits with strength/mobility/safety awareness. Would benefit from skilled PT to address above deficits and promote optimal return to PLOF. Will continue to perform PT services while admitted and will defer disposition to [facility] team.     Recommendations for follow up therapy are one component of a multi-disciplinary discharge planning process, led by the attending physician.  Recommendations may be updated based on patient status, additional functional criteria and insurance authorization.  Follow Up Recommendations       Assistance Recommended at Discharge Set up Supervision/Assistance  Patient can return home with the following  A little help with walking and/or transfers;Help with stairs or ramp for entrance    Equipment Recommendations None recommended by PT  Recommendations for Other Services       Functional Status Assessment Patient has had a recent decline in their functional status and demonstrates the ability to make significant improvements in function in a reasonable and predictable amount of time.     Precautions / Restrictions Precautions Precautions: Fall Restrictions Weight Bearing Restrictions: No      Mobility  Bed Mobility Overal bed mobility: Independent             General bed mobility comments: safe technique    Transfers Overall transfer level: Modified independent Equipment used: Rolling walker (2 wheels)               General transfer comment: slightly impulsive and needs cues  for safety    Ambulation/Gait Ambulation/Gait assistance: Min guard Gait Distance (Feet): 30 Feet Assistive device: Rolling walker (2 wheels) Gait Pattern/deviations: Step-to pattern       General Gait Details: ambulated in room, however didn't feel up to performing hallway distances this date. RW used; adjusted to pt height  Information systems manager Rank[place] (Stroke Patients Only)       Balance Overall balance assessment: Needs assistance Sitting-balance support: Feet supported Sitting balance-Leahy Scale: Good     Standing balance support: Bilateral upper extremity supported Standing balance-Leahy Scale: Good                               Pertinent Vitals/Pain Pain Assessment Pain Assessment: No/denies pain    Home Living Family/patient expects to be discharged to:: Private residence Living Arrangements: Other relatives (brother lives with her) Available Help at Discharge: Family Type of Home: House Home Access: Stairs to enter Entrance Stairs-Rails: Can reach both Entrance Stairs-Number of Steps: 2   Home Layout: One level Home Equipment: Agricultural consultant (2 wheels);Rollator (4 wheels);Cane - single point      Prior Function Prior Level of Function : History of Falls (last six months);Independent/Modified Independent             Mobility Comments: reports recent fall while using rollator ADLs Comments: Pt is indep with all ADLs, however brother assist with outside chores including taking care of lawn     Hand Dominance  Extremity/Trunk Assessment   Upper Extremity Assessment Upper Extremity Assessment: Overall WFL for tasks assessed    Lower Extremity Assessment Lower Extremity Assessment: Generalized weakness (B LE grossly 4/5)       Communication   Communication: No difficulties  Cognition Arousal/Alertness: Awake/alert Behavior During Therapy: WFL for tasks assessed/performed Overall  Cognitive Status: Within Functional Limits for tasks assessed                                          General Comments      Exercises     Assessment/Plan    PT Assessment Patient needs continued PT services  PT Problem List Decreased strength;Decreased balance;Decreased activity tolerance;Decreased mobility       PT Treatment Interventions DME instruction;Gait training;Stair training;Therapeutic exercise;Balance training    PT Goals (Current goals can be found in the Care Plan section)  Acute Rehab PT Goals Patient Stated Goal: to go home PT Goal Formulation: With patient Time For Goal Achievement: 01/20/23 Potential to Achieve Goals: Good    Frequency Min 3X/week     Co-evaluation               AM-PAC PT "6 Clicks" Mobility  Outcome Measure Help needed turning from your back to your side while in a flat bed without using bedrails?: None Help needed moving from lying on your back to sitting on the side of a flat bed without using bedrails?: None Help needed moving to and from a bed to a chair (including a wheelchair)?: A Little Help needed standing up from a chair using your arms (e.g., wheelchair or bedside chair)?: A Little Help needed to walk in hospital room?: A Little Help needed climbing 3-5 steps with a railing? : A Little 6 Click Score: 20    End of Session   Activity Tolerance: Patient tolerated treatment well Patient left: in chair;with chair alarm set Nurse Communication: Mobility status PT Visit Diagnosis: Unsteadiness on feet (R26.81);Muscle weakness (generalized) (M62.81);Difficulty in walking, not elsewhere classified (R26.2)    Time: 1008-1020 PT Time Calculation (min) (ACUTE ONLY): 12 min   Charges:   PT Evaluation $PT Eval Low Complexity: 1 Low          Elizabeth Palau, PT, DPT, GCS [phone]   Sriyan Cutting 01/06/2023, 11:15 AM

## 2023-01-06 NOTE — Consult Note (Signed)
Midge Minium, MD [facility]  [address] [place], [place] [address] Phone: [phone] Fax : [phone]  Consultation  Referring Provider:     Dr. Renae Gloss Primary Care Physician:  Dwana Melena, [place] Primary Gastroenterologist: [facility]         Reason for Consultation:     Upper GI bleed  Date of Admission:  01/05/2023 Date of Consultation:  01/06/2023         HPI:   Tracy Dickerson is a 82 y.o. female who has a history of reflux breast cancer atrial fibrillation COPD hypertension hyperlipidemia who is on anticoagulation and came in with a upper GI bleed.  The patient had reported that she had bloody emesis x 2 days.  Upon presentation the patient's hemoglobin was 12.4 and it was checked every few hours with variations going up to 13.6 and then was 11.9 this morning.  The patient's BUN was not elevated consistent with an upper GI bleed.  The patient reports that she is on Eliquis and denies any use of NSAIDs.  The patient denies any unexplained weight loss fevers chills nausea vomiting or any report of any black stools.  Past Medical History:  Diagnosis Date   Cancer    Breast and lung   Diabetes mellitus without complication    GERD (gastroesophageal reflux disease)    Heart disease    Hypertension     Past Surgical History:  Procedure Laterality Date   ABDOMINAL HYSTERECTOMY     APPENDECTOMY     BREAST SURGERY     breast cancer LEFT   OPEN REDUCTION INTERNAL FIXATION (ORIF) TIBIA/FIBULA FRACTURE Left 02/03/2018   Procedure: OPEN REDUCTION INTERNAL FIXATION (ORIF) DISTAL TIBIA FRACTURE;  Surgeon: Christena Flake, MD;  Location: [facility] ORS;  Service: Orthopedics;  Laterality: Left;  ankle/lower leg   TONSILLECTOMY      Prior to Admission medications   Medication Sig Start Date End Date Taking? Authorizing Provider  fluticasone-salmeterol (ADVAIR) 250-50 MCG/ACT AEPB Inhale 1 puff into the lungs daily.   Yes [provider]  acetaminophen (TYLENOL) 500 MG  tablet Take 500 mg by mouth every 6 (six) hours as needed.    [provider]  albuterol (PROAIR HFA) 108 (90 Base) MCG/ACT inhaler Inhale 2 puffs into the lungs every 4 (four) hours as needed for wheezing or shortness of breath.     [provider]  apixaban (ELIQUIS) 5 MG TABS tablet Take 5 mg by mouth 2 (two) times daily. 06/17/18   [provider]  atorvastatin (LIPITOR) 20 MG tablet Take by mouth. 05/01/20   [provider]  diphenhydramine-acetaminophen (TYLENOL PM) 25-500 MG TABS tablet Take 1-2 tablets by mouth at bedtime as needed (pain or sleep).     [provider]  gabapentin (NEURONTIN) 300 MG capsule Take 300 mg by mouth at bedtime. Take 2 caspules by mouth at bedtime    [provider]  lisinopril (ZESTRIL) 10 MG tablet Take 10 mg by mouth daily. 06/28/20   [provider]  loperamide (IMODIUM A-D) 2 MG tablet Take 1 tablet (2 mg total) by mouth 4 (four) times daily as needed for diarrhea or loose stools. 05/24/21   [name], Delorise Royals, PA-C  metFORMIN (GLUCOPHAGE) 1000 MG tablet Take 1,000 mg by mouth 2 (two) times daily with a meal.    [provider]  ondansetron (ZOFRAN ODT) 4 MG disintegrating tablet Take 1 tablet (4 mg total) by mouth every  8 (eight) hours as needed. 06/25/21   Menshew, Charlesetta Ivory, PA-C  polyethylene glycol (MIRALAX / GLYCOLAX) 17 g packet Take 17 g by mouth daily. 08/16/20   Delfino Lovett, MD  senna-docusate (SENOKOT-S) 8.6-50 MG tablet Take 2 tablets by mouth at bedtime as needed for mild constipation. 08/15/20   Delfino Lovett, MD  SPIRIVA HANDIHALER 18 MCG inhalation capsule Place 1 capsule (18 mcg total) into inhaler and inhale daily. 02/09/22   Orvil Feil, PA-C  traMADol (ULTRAM) 50 MG tablet Take 50 mg by mouth 2 (two) times daily as needed.     [provider]    Family History  Problem Relation Age of Onset   Cancer Mother      Social History   Tobacco Use   Smoking  status: Former   Smokeless tobacco: Never  Vaping Use   Vaping Use: Never used  Substance Use Topics   Alcohol use: Not Currently   Drug use: No    Allergies as of 01/05/2023 - Review Complete 01/05/2023  Allergen Reaction Noted   Oxycodone  03/13/2018   Percocet [oxycodone-acetaminophen] Itching 12/24/2015   Penicillins Rash 02/06/2015    Review of Systems:    All systems reviewed and negative except where noted in HPI.   Physical Exam:  Vital signs in last 24 hours: Temp:  [97.4 F (36.3 C)-98.1 F (36.7 C)] 97.6 F (36.4 C) (04/15 1607) Pulse Rate:  [52-89] 83 (04/15 1607) Resp:  [16-20] 20 (04/15 1607) BP: (146-158)/(56-102) 156/102 (04/15 1607) SpO2:  [93 %-97 %] 97 % (04/15 1607) Last BM Date : 01/03/23 (per pt) General:   Pleasant, cooperative in NAD Head:  Normocephalic and atraumatic. Eyes:   No icterus.   Conjunctiva pink. PERRLA. Ears:  Normal auditory acuity. Neck:  Supple; no masses or thyroidomegaly Lungs: Respirations even and unlabored. Lungs clear to auscultation bilaterally.   No wheezes, crackles, or rhonchi.  Heart:  Regular rate and rhythm;  Without murmur, clicks, rubs or gallops Abdomen:  Soft, nondistended, nontender. Normal bowel sounds. No appreciable masses or hepatomegaly.  No rebound or guarding.  Rectal:  Not performed. Msk:  Symmetrical without gross deformities.    Extremities:  Without edema, cyanosis or clubbing. Neurologic:  Alert and oriented x3;  grossly normal neurologically. Skin:  Intact without significant lesions or rashes. Cervical Nodes:  No significant cervical adenopathy. Psych:  Alert and cooperative. Normal affect.  LAB RESULTS: Recent Labs    01/05/23 1147 01/05/23 1515 01/05/23 2002 01/06/23 0208 01/06/23 0846  WBC 7.2  --   --   --   --   HGB 12.4   < > 13.6 12.4 11.9*  HCT 37.9   < > 41.7 37.6 32.3*  PLT 232  --   --   --   --    < > = values in this interval not displayed.   BMET Recent Labs     01/05/23 1147 01/06/23 0208  NA 143 141  K 3.7 3.0*  CL 108 109  CO2 25 24  GLUCOSE 114* 103*  BUN 12 9  CREATININE 1.03* 0.91  CALCIUM 8.7* 8.5*   LFT Recent Labs    01/06/23 0208  PROT 6.3*  ALBUMIN 3.2*  AST 14*  ALT 9  ALKPHOS 71  BILITOT 0.9   PT/INR Recent Labs    01/05/23 1147  LABPROT 15.9*  INR 1.3*    STUDIES: CT Abdomen Pelvis W Contrast  Result Date: 01/05/2023 CLINICAL DATA:  Acute non  localized abdominal pain. EXAM: CT ABDOMEN AND PELVIS WITH CONTRAST TECHNIQUE: Multidetector CT imaging of the abdomen and pelvis was performed using the standard protocol following bolus administration of intravenous contrast. RADIATION DOSE REDUCTION: This exam was performed according to the departmental dose-optimization program which includes automated exposure control, adjustment of the mA and/or kV according to patient size and/or use of iterative reconstruction technique. CONTRAST:  OMNIPAQUE IOHEXOL 300 MG/ML  SOLN COMPARISON:  05/24/2021 FINDINGS: Lower Chest: No acute findings. Chronic interstitial lung disease again seen in both bases. Hepatobiliary: No hepatic masses identified. Tiny sub-cm cyst in the anterior left hepatic lobe remains stable. Gallbladder is unremarkable. No evidence of biliary ductal dilatation. Pancreas:  No mass or inflammatory changes. Spleen: Within normal limits in size and appearance. Adrenals/Urinary Tract: No suspicious masses identified. Multiple benign-appearing renal cysts are again seen bilaterally (no followup imaging is recommended). 7 mm nonobstructing right renal calculus again noted. No evidence of ureteral calculi or hydronephrosis. Stomach/Bowel: No evidence of obstruction, inflammatory process or abnormal fluid collections. Diverticulosis is seen involving the sigmoid colon, however there is no evidence of diverticulitis. Vascular/Lymphatic: No pathologically enlarged lymph nodes. Aortic atherosclerotic calcification incidentally  noted. 3.0 cm infrarenal abdominal aortic aneurysm is now seen. Reproductive: Prior hysterectomy noted. Adnexal regions are unremarkable in appearance. Other:  None. Musculoskeletal:  No suspicious bone lesions identified. IMPRESSION: No acute findings within the abdomen or pelvis. Mild sigmoid diverticulosis, without radiographic evidence of diverticulitis. 7 mm nonobstructing right renal calculus. 3.0 cm infrarenal abdominal aortic aneurysm. Recommend follow-up every 3 years. This recommendation follows ACR consensus guidelines: White Paper of the ACR Incidental Findings Committee II on Vascular Findings. J Am Coll Radiol 2013; 10:789-794. Aortic Atherosclerosis (ICD10-I70.0). Electronically Signed   By: Danae Orleans M.D.   On: 01/05/2023 13:18      Impression / Plan:   Assessment: Principal Problem:   UGIB (upper gastrointestinal bleed) Active Problems:   PAF (paroxysmal atrial fibrillation)   HTN (hypertension)   Unsatisfactory living conditions   CKD (chronic kidney disease), stage II   Overweight (BMI 25.0-29.9)   Type 2 diabetes mellitus with hyperlipidemia   COPD (chronic obstructive pulmonary disease)   Hypokalemia   Tracy Dickerson is a 82 y.o. y/o female with a report of hematemesis.  The patient had her Eliquis held.  She denies any abdominal pain or NSAID use.  It appears that the patient had an upper endoscopy in 2017 and a colonoscopy in 2019.  The colonoscopy was deemed a poor prep with inability to examine the colon sufficiently.  The upper endoscopy was suspicious for Barrett's esophagus but the biopsies were taken and sent off and did not show any sign of intestinal metaplasia.  Plan:  Due to his patient coming in with a self-reported hematemesis despite the patient's BUN being normal and a very slight drop in her hemoglobin of compared to her admission hemoglobin which can be a result of hydration.  Due to the patient's comorbidities and risks if any further GI bleeding  should occur, she will be set up for an upper endoscopy for tomorrow.  The patient will be kept n.p.o. tonight.  The patient has been explained the plan and agrees with it.  Thank you for involving me in the care of this patient.      LOS: 1 day   Midge Minium, MD, [facility] 01/06/2023, 6:09 PM,  Pager [phone] 7am-5pm  Check AMION for 5pm -7am coverage and on weekends   Note: This dictation was  prepared with Dragon dictation along with smaller phrase technology. Any transcriptional errors that result from this process are unintentional.

## 2023-01-06 NOTE — Assessment & Plan Note (Signed)
Patient had dark stool the other day and bloody vomitus.  Patient is not the best historian.  Last dose of Eliquis likely in the morning of 4/14.  Full liquid diet today.  Case discussed with GI to see in consultation.  Serial hemoglobins.  On Protonix.  Holding Eliquis.

## 2023-01-06 NOTE — Progress Notes (Addendum)
Progress Note   Patient: Tracy Dickerson GQB:[id number] DOB: 05-13-1941 DOA: 01/05/2023     1 DOS: the patient was seen and examined on 01/06/2023   Brief hospital course: 82 year old female with history of atrial fibrillation on Eliquis, COPD, hypertension, hyperlipidemia, GERD and history of breast cancer presents with vomiting blood.  She was also found in poor living conditions with bedbugs.  4/15.  Clarified that GI was not consulted from the emergency room like initially thought.  Case discussed with GI to see today.  Since the patient's last dose of Eliquis was likely on the morning of 4/14, will hold Eliquis today and allow the patient to have full liquid diet and potential GI evaluation with endoscopy tomorrow.  Assessment and Plan: * UGIB (upper gastrointestinal bleed) Patient had dark stool the other day and bloody vomitus.  Patient is not the best historian.  Last dose of Eliquis likely in the morning of 4/14.  Full liquid diet today.  Case discussed with GI to see in consultation.  Serial hemoglobins.  On Protonix.  Holding Eliquis.   PAF (paroxysmal atrial fibrillation) Holding Eliquis, not on any rate controlling medication.  Hypokalemia Replace potassium  COPD (chronic obstructive pulmonary disease) On Spiriva  Type 2 diabetes mellitus with hyperlipidemia Outpatient hemoglobin A1c is 5.4.  On Lipitor.  Overweight (BMI 25.0-29.9) I do not believe that this is an accurate weight in the computer.  BMI 27.44.  Will order daily weights.  CKD (chronic kidney disease), stage II Creatinine is 0.91 with a GFR greater than 60.  (The patient does not have CKD stage IIIa).  Unsatisfactory living conditions Noted poor living conditions by EMS with active severe bedbug infestation.  Patient's close placed in a plastic bag.  Advised patient that she will have to either throw out her mattress or cover it and plastic.  She will have to cover the counters and plastic also.  They will  evaluate have to get an exterminator into kill the bedbugs or get the bedbug treatment sprays.  HTN (hypertension) Slight orthostasis with standing.  Hold antihypertensive medication today and recheck tomorrow.        Subjective: Patient states that she does not have any further nausea or vomiting.  No abdominal pain.  States her stools were dark the other day.  Did vomit up some blood.  Patient is not the best historian so I do think her last dose of Eliquis was the morning prior to coming in.  Physical Exam: Vitals:   01/05/23 1841 01/05/23 2007 01/06/23 0507 01/06/23 0857  BP: (!) 158/69 (!) 154/56 (!) 146/78 (!) 151/99  Pulse:  (!) 52 88 89  Resp:  16 16 20   Temp:  98.1 F (36.7 C) 97.9 F (36.6 C) (!) 97.4 F (36.3 C)  TempSrc:  Oral Oral Oral  SpO2:  97% 93% 96%  Weight:      Height:       Physical Exam HENT:     Head: Normocephalic.     Mouth/Throat:     Pharynx: No oropharyngeal exudate.  Eyes:     General: Lids are normal.     Conjunctiva/sclera: Conjunctivae normal.  Cardiovascular:     Rate and Rhythm: Normal rate and regular rhythm.     Heart sounds: Normal heart sounds, S1 normal and S2 normal.  Pulmonary:     Breath sounds: No decreased breath sounds, wheezing, rhonchi or rales.  Abdominal:     Palpations: Abdomen is soft.  Tenderness: There is no abdominal tenderness.  Musculoskeletal:     Right lower leg: Swelling present.     Left lower leg: Swelling present.  Skin:    General: Skin is warm.     Findings: No rash.  Neurological:     Mental Status: She is alert and oriented to person, place, and time.     Data Reviewed: Last hemoglobin 11.9, potassium 3.0, creatinine 0.91  Family Communication:   Disposition: Status is: Inpatient Remains inpatient appropriate because: Holding Eliquis.  GI consultation.  Serial hemoglobins.  Planned Discharge Destination: Home and Home with Home Health    Time spent: 28 minutes  Author: Alford Highland, MD 01/06/2023 1:35 PM  For on call review [email or web address].

## 2023-01-06 NOTE — Assessment & Plan Note (Signed)
Creatinine is 0.91 with a GFR greater than 60.  (The patient does not have CKD stage IIIa).

## 2023-01-06 NOTE — Hospital Course (Signed)
82 year old female with history of atrial fibrillation on Eliquis, COPD, hypertension, hyperlipidemia, GERD and history of breast cancer presents with vomiting blood.  She was also found in poor living conditions with bedbugs.  4/15.  Clarified that GI was not consulted from the emergency room like initially thought.  Case discussed with GI to see today.  Since the patient's last dose of Eliquis was likely on the morning of 4/14, will hold Eliquis today and allow the patient to have full liquid diet and potential GI evaluation with endoscopy tomorrow. 4/16.  Endoscopy negative.  Hemoglobin stable.  Stable for discharge home.  Can go back on Eliquis as outpatient.  Restarted diet.

## 2023-01-06 NOTE — Assessment & Plan Note (Signed)
On Spiriva

## 2023-01-06 NOTE — Assessment & Plan Note (Signed)
Holding Eliquis, not on any rate controlling medication.

## 2023-01-06 NOTE — Assessment & Plan Note (Signed)
Replaced 

## 2023-01-06 NOTE — Assessment & Plan Note (Addendum)
I do not believe that this is an accurate weight in the computer.  BMI 27.44.  Will order daily weights.

## 2023-01-06 NOTE — Assessment & Plan Note (Signed)
Noted poor living conditions by EMS with active severe bedbug infestation.  Patient's clothes placed in a plastic bag.  Advised patient that she will have to either throw out her mattress or cover it with plastic cover.  She will have to cover the couch with plastic also.  Patient states that her brother treated the bedbugs.

## 2023-01-07 ENCOUNTER — Inpatient Hospital Stay: Payer: 59 | Admitting: Anesthesiology

## 2023-01-07 ENCOUNTER — Encounter
Admission: EM | Disposition: A | Discharge status: Home / Self Care | Payer: Self-pay | Source: Home / Self Care | Attending: Internal Medicine

## 2023-01-07 ENCOUNTER — Encounter: Payer: Self-pay | Admitting: Family Medicine

## 2023-01-07 DIAGNOSIS — E876 Hypokalemia: Secondary | ICD-10-CM

## 2023-01-07 DIAGNOSIS — K922 Gastrointestinal hemorrhage, unspecified: Secondary | ICD-10-CM | POA: Diagnosis not present

## 2023-01-07 DIAGNOSIS — J449 Chronic obstructive pulmonary disease, unspecified: Secondary | ICD-10-CM | POA: Diagnosis not present

## 2023-01-07 DIAGNOSIS — I480 Paroxysmal atrial fibrillation: Secondary | ICD-10-CM | POA: Diagnosis not present

## 2023-01-07 DIAGNOSIS — N1831 Chronic kidney disease, stage 3a: Secondary | ICD-10-CM | POA: Diagnosis not present

## 2023-01-07 DIAGNOSIS — E1169 Type 2 diabetes mellitus with other specified complication: Secondary | ICD-10-CM

## 2023-01-07 HISTORY — PX: ESOPHAGOGASTRODUODENOSCOPY (EGD) WITH PROPOFOL: SHX5813

## 2023-01-07 LAB — CBC
HCT: 34.8 % — ABNORMAL LOW (ref 36.0–46.0)
Hemoglobin: 11.6 g/dL — ABNORMAL LOW (ref 12.0–15.0)
MCH: 30.4 pg (ref 26.0–34.0)
MCHC: 33.3 g/dL (ref 30.0–36.0)
MCV: 91.3 fL (ref 80.0–100.0)
Platelets: 215 10*3/uL (ref 150–400)
RBC: 3.81 MIL/uL — ABNORMAL LOW (ref 3.87–5.11)
RDW: 14.6 % (ref 11.5–15.5)
WBC: 6.9 10*3/uL (ref 4.0–10.5)
nRBC: 0 % (ref 0.0–0.2)

## 2023-01-07 LAB — BASIC METABOLIC PANEL
Anion gap: 7 (ref 5–15)
BUN: 6 mg/dL — ABNORMAL LOW (ref 8–23)
CO2: 26 mmol/L (ref 22–32)
Calcium: 8.3 mg/dL — ABNORMAL LOW (ref 8.9–10.3)
Chloride: 110 mmol/L (ref 98–111)
Creatinine, Ser: 0.99 mg/dL (ref 0.44–1.00)
GFR, Estimated: 58 mL/min — ABNORMAL LOW (ref >60)
Glucose, Bld: 98 mg/dL (ref 70–99)
Potassium: 3 mmol/L — ABNORMAL LOW (ref 3.5–5.1)
Sodium: 143 mmol/L (ref 135–145)

## 2023-01-07 SURGERY — ESOPHAGOGASTRODUODENOSCOPY (EGD) WITH PROPOFOL
Anesthesia: General

## 2023-01-07 MED ORDER — POTASSIUM CHLORIDE 10 MEQ/100ML IV SOLN
10.0000 meq | INTRAVENOUS | Status: DC
  Filled 2023-01-07: qty 100

## 2023-01-07 MED ORDER — PROPOFOL 500 MG/50ML IV EMUL
INTRAVENOUS | Status: DC | PRN
  Administered 2023-01-07: 165 ug/kg/min via INTRAVENOUS

## 2023-01-07 MED ORDER — SODIUM CHLORIDE 0.9 % IV SOLN: INTRAVENOUS | Status: DC | PRN

## 2023-01-07 MED ORDER — POTASSIUM CHLORIDE CRYS ER 20 MEQ PO TBCR: 40.0000 meq | EXTENDED_RELEASE_TABLET | Freq: Once | ORAL | Status: DC

## 2023-01-07 NOTE — Anesthesia Postprocedure Evaluation (Signed)
Anesthesia Post Note  Patient: Tracy Dickerson  Procedure(s) Performed: ESOPHAGOGASTRODUODENOSCOPY (EGD) WITH PROPOFOL  Patient location during evaluation: Endoscopy Anesthesia Type: General Level of consciousness: awake and alert Pain management: pain level controlled Vital Signs Assessment: post-procedure vital signs reviewed and stable Respiratory status: spontaneous breathing, nonlabored ventilation, respiratory function stable and patient connected to nasal cannula oxygen Cardiovascular status: blood pressure returned to baseline and stable Postop Assessment: no apparent nausea or vomiting Anesthetic complications: no   No notable events documented.   Last Vitals:  Vitals:   01/07/23 1313 01/07/23 1343  BP: (!) 144/81 138/79  Pulse: 89 79  Resp:  18  Temp:  36.6 C  SpO2: 97% 99%    Last Pain:  Vitals:   01/07/23 1343  TempSrc: Oral  PainSc:                  [name] [name]

## 2023-01-07 NOTE — TOC Initial Note (Signed)
Transition of Care [facility]) - Initial/Assessment Note    Patient Details  Name: Tracy Dickerson MRN: [id number] Date of Birth: 02/03/1941  [facility]) CM/SW Contact:    Chapman Fitch, RN Phone Number: 01/07/2023, 2:45 PM  Clinical Narrative:                 Admitted for: upper gi bleed Admitted from: home with brother roger PCP: Lorin Picket  Current home health/prior home health/DME: [facility]     Therapy recommending home health. Patient declines Brother to meet patient at room and they are going to take the [employer] bus home together, which is their regular means of transportation     Patient Goals and CMS Choice            Expected Discharge Plan and Services         Expected Discharge Date: 01/07/23                                    Prior Living Arrangements/Services                       Activities of Daily Living Home Assistive Devices/Equipment: Shower chair with back, Eyeglasses, Environmental consultant (specify type) ADL Screening (condition at time of admission) Patient's cognitive ability adequate to safely complete daily activities?: Yes Is the patient deaf or have difficulty hearing?: No Does the patient have difficulty seeing, even when wearing glasses/contacts?: No Does the patient have difficulty concentrating, remembering, or making decisions?: No Patient able to express need for assistance with ADLs?: No Does the patient have difficulty dressing or bathing?: No Independently performs ADLs?: Yes (appropriate for developmental age) Does the patient have difficulty walking or climbing stairs?: No Weakness of Legs: None Weakness of Arms/Hands: None  Permission Sought/Granted                  Emotional Assessment              Admission diagnosis:  Mallory-Weiss tear [K22.6] Generalized abdominal pain [R10.84] UGIB (upper gastrointestinal bleed) [K92.2] Hematemesis with nausea [K92.0] Patient Active Problem List    Diagnosis Date Noted   CKD (chronic kidney disease), stage II 01/06/2023   Overweight (BMI 25.0-29.9) 01/06/2023   Type 2 diabetes mellitus with hyperlipidemia 01/06/2023   COPD (chronic obstructive pulmonary disease) 01/06/2023   Hypokalemia 01/06/2023   UGIB (upper gastrointestinal bleed) 01/05/2023   Unsatisfactory living conditions 01/05/2023   Constipation    UTI (urinary tract infection) 08/13/2020   HLD (hyperlipidemia) 08/13/2020   Acute renal failure superimposed on stage 3a chronic kidney disease 08/13/2020   PAF (paroxysmal atrial fibrillation) 08/13/2020   HTN (hypertension) 08/13/2020   Dehydration    Dehydration, moderate    Acute renal failure 04/22/2020   Hyperkalemia    Septic shock    Sepsis due to urinary tract infection 03/13/2018   Acute kidney injury superimposed on chronic kidney disease 02/13/2018   Closed extra-articular fracture of distal tibia 02/02/2018   Pneumonia 10/29/2017   Hypotension 01/26/2017   PCP:  Dwana Melena, PA Pharmacy:   [facility] [address] [place], [place] - [address] [facility] [address] [place] [address] Phone: [phone] Fax: [phone]  [facility] - [place], [place] - [address] [place] [address] Phone: [phone] Fax: [phone]  [facility]  [id number] [place], [place] - [address] AT [facility] [address]  [place] [address] Phone: [phone] Fax: [phone]  [facility] - [place], [place] - [address] [place] [address] Phone: [phone] Fax: [phone]     Social Determinants of Health (SDOH) Social History: SDOH Screenings   Food Insecurity: No Food Insecurity (01/05/2023)  Housing: Medium Risk (01/05/2023)  Transportation Needs: No Transportation Needs (01/05/2023)  Utilities: Not At Risk (01/05/2023)  Tobacco Use: Medium Risk (01/07/2023)   SDOH Interventions:      Readmission Risk Interventions     No data to display

## 2023-01-07 NOTE — Op Note (Signed)
[facility] Gastroenterology Patient Name: Tracy Dickerson Procedure Date: 01/07/2023 12:31 PM MRN: [id number] Account #: [id number] Date of Birth: 1940/11/22 Admit Type: Outpatient Age: 82 Room: [facility] ENDO ROOM 3 Gender: Female Note Status: Finalized Instrument Name: Upper Endoscope [id number] Procedure:             Upper GI endoscopy Indications:           Hematemesis Providers:             Midge Minium MD, MD Medicines:             Propofol per Anesthesia Complications:         No immediate complications. Procedure:             Pre-Anesthesia Assessment:                        - Prior to the procedure, a History and Physical was                         performed, and patient medications and allergies were                         reviewed. The patient's tolerance of previous                         anesthesia was also reviewed. The risks and benefits                         of the procedure and the sedation options and risks                         were discussed with the patient. All questions were                         answered, and informed consent was obtained. Prior                         Anticoagulants: The patient has taken no anticoagulant                         or antiplatelet agents. ASA Grade Assessment: II - A                         patient with mild systemic disease. After reviewing                         the risks and benefits, the patient was deemed in                         satisfactory condition to undergo the procedure.                        After obtaining informed consent, the endoscope was                         passed under direct vision. Throughout the procedure,                         the patient's blood pressure,  pulse, and oxygen                         saturations were monitored continuously. The Endoscope                         was introduced through the mouth, and advanced to the                         second part of duodenum. The  upper GI endoscopy was                         accomplished without difficulty. The patient tolerated                         the procedure well. Findings:      The examined esophagus was normal.      The entire examined stomach was normal.      The examined duodenum was normal. Impression:            - Normal esophagus.                        - Normal stomach.                        - Normal examined duodenum.                        - No specimens collected. Recommendation:        - Return patient to hospital ward for ongoing care.                        - Resume previous diet.                        - Continue present medications. Procedure Code(s):     --- Professional ---                        [phone], Esophagogastroduodenoscopy, flexible,                         transoral; diagnostic, including collection of                         specimen(s) by brushing or washing, when performed                         (separate procedure) Diagnosis Code(s):     --- Professional ---                        K92.0, Hematemesis CPT copyright 2022 American Medical Association. All rights reserved. The codes documented in this report are preliminary and upon coder review may  be revised to meet current compliance requirements. Midge Minium MD, MD 01/07/2023 12:57:19 PM This report has been signed electronically. Number of Addenda: 0 Note Initiated On: 01/07/2023 12:31 PM Estimated Blood Loss:  Estimated blood loss: none.      [facility]

## 2023-01-07 NOTE — Anesthesia Preprocedure Evaluation (Signed)
Anesthesia Evaluation  Patient identified by MRN, date of birth, ID band Patient awake    Reviewed: Allergy & Precautions, NPO status , Patient's Chart, lab work & pertinent test results  History of Anesthesia Complications Negative for: history of anesthetic complications  Airway Mallampati: III  TM Distance: <3 FB Neck ROM: full    Dental  (+) Chipped, Poor Dentition, Missing   Pulmonary neg shortness of breath, COPD, former smoker   Pulmonary exam normal        Cardiovascular Exercise Tolerance: Good hypertension, (-) angina (-) Past MI Normal cardiovascular exam     Neuro/Psych negative neurological ROS  negative psych ROS   GI/Hepatic Neg liver ROS,GERD  Controlled,,  Endo/Other  diabetes, Type 2    Renal/GU Renal disease  negative genitourinary   Musculoskeletal   Abdominal   Peds  Hematology negative hematology ROS (+)   Anesthesia Other Findings Past Medical History: No date: Cancer     Comment:  Breast and lung No date: Diabetes mellitus without complication No date: GERD (gastroesophageal reflux disease) No date: Heart disease No date: Hypertension  Past Surgical History: No date: ABDOMINAL HYSTERECTOMY No date: APPENDECTOMY No date: BREAST SURGERY     Comment:  breast cancer LEFT 02/03/2018: OPEN REDUCTION INTERNAL FIXATION (ORIF) TIBIA/FIBULA  FRACTURE; Left     Comment:  Procedure: OPEN REDUCTION INTERNAL FIXATION (ORIF)               DISTAL TIBIA FRACTURE;  Surgeon: Christena Flake, MD;                Location: [facility] ORS;  Service: Orthopedics;  Laterality:               Left;  ankle/lower leg No date: TONSILLECTOMY  BMI    Body Mass Index: 27.40 kg/m      Reproductive/Obstetrics negative OB ROS                             Anesthesia Physical Anesthesia Plan  ASA: 3  Anesthesia Plan: General   Post-op Pain Management:    Induction: Intravenous  PONV  Risk Score and Plan: Propofol infusion and TIVA  Airway Management Planned: Natural Airway and Nasal Cannula  Additional Equipment:   Intra-op Plan:   Post-operative Plan:   Informed Consent: I have reviewed the patients History and Physical, chart, labs and discussed the procedure including the risks, benefits and alternatives for the proposed anesthesia with the patient or authorized representative who has indicated his/her understanding and acceptance.     Dental Advisory Given  Plan Discussed with: Anesthesiologist, CRNA and Surgeon  Anesthesia Plan Comments: (Patient consented for risks of anesthesia including but not limited to:  - adverse reactions to medications - risk of airway placement if required - damage to eyes, teeth, lips or other oral mucosa - nerve damage due to positioning  - sore throat or hoarseness - Damage to heart, brain, nerves, lungs, other parts of body or loss of life  Patient voiced understanding.)       Anesthesia Quick Evaluation

## 2023-01-07 NOTE — Progress Notes (Signed)
Patient refused potassium chloride replacement despite education provided on its importance. MD notified

## 2023-01-07 NOTE — Progress Notes (Signed)
Physical Therapy Treatment Patient Details Name: Tracy Dickerson MRN: [id number] DOB: March 06, 1941 Today's Date: 01/07/2023   History of Present Illness Pt admitted for UGIB. Orthostatics negative this AM. History includes Afib, COPD, HTN, HLD, breast ca, and GERD.    PT Comments    Pt is making good progress towards goals with improved gait distance this session and improved fluid gait pattern. Pt is approaching functional baseline, however still demonstrates poor safety awareness. Pt agreeable to sit in recliner at end of session. Will continue to progress.   Recommendations for follow up therapy are one component of a multi-disciplinary discharge planning process, led by the attending physician.  Recommendations may be updated based on patient status, additional functional criteria and insurance authorization.  Follow Up Recommendations       Assistance Recommended at Discharge Set up Supervision/Assistance  Patient can return home with the following A little help with walking and/or transfers;Help with stairs or ramp for entrance   Equipment Recommendations  None recommended by PT    Recommendations for Other Services       Precautions / Restrictions Precautions Precautions: Fall Restrictions Weight Bearing Restrictions: No     Mobility  Bed Mobility Overal bed mobility: Independent             General bed mobility comments: safe technique, slightly impulsive and needs cues for waiting for therapist    Transfers Overall transfer level: Independent Equipment used: None               General transfer comment: safe technique with upright posture noted    Ambulation/Gait Ambulation/Gait assistance: Supervision Gait Distance (Feet): 200 Feet Assistive device: Rolling walker (2 wheels) Gait Pattern/deviations: Step-through pattern       General Gait Details: improved gait sequencing and pattern with safe technique using RW. Does fatigue with  distance   Stairs             Wheelchair Mobility    Modified Rankin (Stroke Patients Only)       Balance Overall balance assessment: Needs assistance Sitting-balance support: Feet supported Sitting balance-Leahy Scale: Good     Standing balance support: Bilateral upper extremity supported Standing balance-Leahy Scale: Good                              Cognition Arousal/Alertness: Awake/alert Behavior During Therapy: WFL for tasks assessed/performed Overall Cognitive Status: Within Functional Limits for tasks assessed                                 General Comments: pleasant and agreeable to session        Exercises      General Comments        Pertinent Vitals/Pain Pain Assessment Pain Assessment: No/denies pain    Home Living                          Prior Function            PT Goals (current goals can now be found in the care plan section) Acute Rehab PT Goals Patient Stated Goal: to go home PT Goal Formulation: With patient Time For Goal Achievement: 01/20/23 Potential to Achieve Goals: Good Progress towards PT goals: Progressing toward goals    Frequency    Min 3X/week      PT Plan Current plan  remains appropriate    Co-evaluation              AM-PAC PT "6 Clicks" Mobility   Outcome Measure  Help needed turning from your back to your side while in a flat bed without using bedrails?: None Help needed moving from lying on your back to sitting on the side of a flat bed without using bedrails?: None Help needed moving to and from a bed to a chair (including a wheelchair)?: A Little Help needed standing up from a chair using your arms (e.g., wheelchair or bedside chair)?: A Little Help needed to walk in hospital room?: A Little Help needed climbing 3-5 steps with a railing? : A Little 6 Click Score: 20    End of Session Equipment Utilized During Treatment: Gait belt Activity Tolerance:  Patient tolerated treatment well Patient left: in chair;with chair alarm set Nurse Communication: Mobility status PT Visit Diagnosis: Unsteadiness on feet (R26.81);Muscle weakness (generalized) (M62.81);Difficulty in walking, not elsewhere classified (R26.2)     Time: 1610-9604 PT Time Calculation (min) (ACUTE ONLY): 10 min  Charges:  $Gait Training: 8-22 mins                     Elizabeth Palau, PT, DPT, GCS [phone]    Nikkie Liming 01/07/2023, 12:03 PM

## 2023-01-07 NOTE — Assessment & Plan Note (Signed)
Today's creatinine 0.99 with a GFR 58.  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

## 2023-01-07 NOTE — Progress Notes (Signed)
Patient in isolation, demanding that her door be left open. Educated that the door must remain close. When writer closed the door, patient threw her remote control from across the room near the window and hit the door.   Cornell Barman Gerado Nabers

## 2023-01-07 NOTE — Transfer of Care (Signed)
Immediate Anesthesia Transfer of Care Note  Patient: Tracy Dickerson  Procedure(s) Performed: ESOPHAGOGASTRODUODENOSCOPY (EGD) WITH PROPOFOL  Patient Location: PACU  Anesthesia Type:General  Level of Consciousness: awake and alert   Airway & Oxygen Therapy: Patient Spontanous Breathing and Patient connected to nasal cannula oxygen  Post-op Assessment: Report given to RN and Post -op Vital signs reviewed and stable  Post vital signs: Reviewed and stable  Last Vitals:  Vitals Value Taken Time  BP 142/87 01/07/23 1259  Temp    Pulse 94 01/07/23 1259  Resp 13 01/07/23 1259  SpO2 98 % 01/07/23 1259    Last Pain:  Vitals:   01/07/23 1259  TempSrc:   PainSc: Asleep         Complications: No notable events documented.

## 2023-01-07 NOTE — Discharge Summary (Signed)
Physician Discharge Summary   Patient: Tracy Dickerson MRN: [id number] DOB: [birth year]  Admit date:     01/05/2023  Discharge date: 01/07/23  Discharge Physician: Alford Highland   PCP: Dwana Melena, PA   Recommendations at discharge:   Follow-up PCP 5 days  Discharge Diagnoses: Principal Problem:   UGIB (upper gastrointestinal bleed) Active Problems:   PAF (paroxysmal atrial fibrillation)   HTN (hypertension)   Unsatisfactory living conditions   Overweight (BMI 25.0-29.9)   Type 2 diabetes mellitus with hyperlipidemia   COPD (chronic obstructive pulmonary disease)   Hypokalemia   CKD stage 3a, GFR 45-59 ml/min    Hospital Course: 82 year old female with history of atrial fibrillation on Eliquis, COPD, hypertension, hyperlipidemia, GERD and history of breast cancer presents with vomiting blood.  She was also found in poor living conditions with bedbugs.  4/15.  Clarified that GI was not consulted from the emergency room like initially thought.  Case discussed with GI to see today.  Since the patient's last dose of Eliquis was likely on the morning of 4/14, will hold Eliquis today and allow the patient to have full liquid diet and potential GI evaluation with endoscopy tomorrow. 4/16.  Endoscopy negative.  Hemoglobin stable.  Stable for discharge home.  Can go back on Eliquis as outpatient.  Restarted diet.  Assessment and Plan: * UGIB (upper gastrointestinal bleed) Patient had dark stool the other day and bloody vomitus.  Patient is not the best historian.  EGD negative.  Can go back on Eliquis as outpatient.  PAF (paroxysmal atrial fibrillation) Can go back on Eliquis as outpatient.  CKD stage 3a, GFR 45-59 ml/min Today's creatinine 0.99 with a GFR 58.  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  Hypokalemia Replace potassium orally  COPD (chronic obstructive pulmonary disease) On Spiriva  Type 2 diabetes mellitus with hyperlipidemia Outpatient hemoglobin A1c is  5.4.  On Lipitor.  Overweight (BMI 25.0-29.9) I do not believe that this is an accurate weight in the computer.  BMI 27.44.  Will order daily weights.  Unsatisfactory living conditions Noted poor living conditions by EMS with active severe bedbug infestation.  Patient's clothes placed in a plastic bag.  Advised patient that she will have to either throw out her mattress or cover it with plastic cover.  She will have to cover the couch with plastic also.  Patient states that her brother treated the bedbugs.  HTN (hypertension) Can go back on lisinopril as outpatient.         Consultants: Gastroenterology Procedures performed: EGD Disposition: Home health Diet recommendation:  Cardiac and Carb modified diet DISCHARGE MEDICATION: Allergies as of 01/07/2023       Reactions   Oxycodone    Percocet [oxycodone-acetaminophen] Itching   Penicillins Rash   Has patient had a PCN reaction causing immediate rash, facial/tongue/throat swelling, SOB or lightheadedness with hypotension: Yes Has patient had a PCN reaction causing severe rash involving mucus membranes or skin necrosis: Unknown Has patient had a PCN reaction that required hospitalization: Unknown Has patient had a PCN reaction occurring within the last 10 years: No If all of the above answers are "NO", then may proceed with Cephalosporin use.        Medication List     STOP taking these medications    diphenhydramine-acetaminophen 25-500 MG Tabs tablet Commonly known as: TYLENOL PM   loperamide 2 MG tablet Commonly known as: IMODIUM A-D       TAKE these medications  acetaminophen 500 MG tablet Commonly known as: TYLENOL Take 500 mg by mouth every 6 (six) hours as needed.   apixaban 5 MG Tabs tablet Commonly known as: ELIQUIS Take 5 mg by mouth 2 (two) times daily.   atorvastatin 20 MG tablet Commonly known as: LIPITOR Take by mouth.   fluticasone-salmeterol 250-50 MCG/ACT Aepb Commonly known as:  ADVAIR Inhale 1 puff into the lungs daily.   gabapentin 300 MG capsule Commonly known as: NEURONTIN Take 300 mg by mouth at bedtime. Take 2 caspules by mouth at bedtime   lisinopril 10 MG tablet Commonly known as: ZESTRIL Take 10 mg by mouth daily.   metFORMIN 1000 MG tablet Commonly known as: GLUCOPHAGE Take 1,000 mg by mouth 2 (two) times daily with a meal.   ondansetron 4 MG disintegrating tablet Commonly known as: Zofran ODT Take 1 tablet (4 mg total) by mouth every 8 (eight) hours as needed.   polyethylene glycol 17 g packet Commonly known as: MIRALAX / GLYCOLAX Take 17 g by mouth daily.   ProAir HFA 108 (90 Base) MCG/ACT inhaler Generic drug: albuterol Inhale 2 puffs into the lungs every 4 (four) hours as needed for wheezing or shortness of breath.   senna-docusate 8.6-50 MG tablet Commonly known as: Senokot-S Take 2 tablets by mouth at bedtime as needed for mild constipation.   Spiriva HandiHaler 18 MCG inhalation capsule Generic drug: tiotropium Place 1 capsule (18 mcg total) into inhaler and inhale daily.   traMADol 50 MG tablet Commonly known as: ULTRAM Take 50 mg by mouth 2 (two) times daily as needed.        Follow-up Information     Dwana Melena, [place] Follow up on 01/13/2023.   Specialty: Internal Medicine Why: Go at  9:30am. Please bring discharge papers, medication and insurance card when you come. Contact information: [id number] [place] [address] [phone]                Discharge Exam: Ceasar Mons Weights   01/05/23 1144 01/07/23 0442  Weight: 77.1 kg 77 kg   Physical Exam HENT:     Head: Normocephalic.     Mouth/Throat:     Pharynx: No oropharyngeal exudate.  Eyes:     General: Lids are normal.     Conjunctiva/sclera: Conjunctivae normal.  Cardiovascular:     Rate and Rhythm: Normal rate and regular rhythm.     Heart sounds: Normal heart sounds, S1 normal and S2 normal.  Pulmonary:      Breath sounds: No decreased breath sounds, wheezing, rhonchi or rales.  Abdominal:     Palpations: Abdomen is soft.     Tenderness: There is no abdominal tenderness.  Musculoskeletal:     Right lower leg: Swelling present.     Left lower leg: Swelling present.  Skin:    General: Skin is warm.     Findings: No rash.  Neurological:     Mental Status: She is alert and oriented to person, place, and time.      Condition at discharge: stable  The results of significant diagnostics from this hospitalization (including imaging, microbiology, ancillary and laboratory) are listed below for reference.   Imaging Studies: CT Abdomen Pelvis W Contrast  Result Date: 01/05/2023 CLINICAL DATA:  Acute non localized abdominal pain. EXAM: CT ABDOMEN AND PELVIS WITH CONTRAST TECHNIQUE: Multidetector CT imaging of the abdomen and pelvis was performed using the standard protocol following bolus administration of intravenous contrast. RADIATION DOSE REDUCTION:  This exam was performed according to the departmental dose-optimization program which includes automated exposure control, adjustment of the mA and/or kV according to patient size and/or use of iterative reconstruction technique. CONTRAST:  OMNIPAQUE IOHEXOL 300 MG/ML  SOLN COMPARISON:  05/24/2021 FINDINGS: Lower Chest: No acute findings. Chronic interstitial lung disease again seen in both bases. Hepatobiliary: No hepatic masses identified. Tiny sub-cm cyst in the anterior left hepatic lobe remains stable. Gallbladder is unremarkable. No evidence of biliary ductal dilatation. Pancreas:  No mass or inflammatory changes. Spleen: Within normal limits in size and appearance. Adrenals/Urinary Tract: No suspicious masses identified. Multiple benign-appearing renal cysts are again seen bilaterally (no followup imaging is recommended). 7 mm nonobstructing right renal calculus again noted. No evidence of ureteral calculi or hydronephrosis. Stomach/Bowel: No  evidence of obstruction, inflammatory process or abnormal fluid collections. Diverticulosis is seen involving the sigmoid colon, however there is no evidence of diverticulitis. Vascular/Lymphatic: No pathologically enlarged lymph nodes. Aortic atherosclerotic calcification incidentally noted. 3.0 cm infrarenal abdominal aortic aneurysm is now seen. Reproductive: Prior hysterectomy noted. Adnexal regions are unremarkable in appearance. Other:  None. Musculoskeletal:  No suspicious bone lesions identified. IMPRESSION: No acute findings within the abdomen or pelvis. Mild sigmoid diverticulosis, without radiographic evidence of diverticulitis. 7 mm nonobstructing right renal calculus. 3.0 cm infrarenal abdominal aortic aneurysm. Recommend follow-up every 3 years. This recommendation follows ACR consensus guidelines: White Paper of the ACR Incidental Findings Committee II on Vascular Findings. J Am Coll Radiol 2013; 10:789-794. Aortic Atherosclerosis (ICD10-I70.0). Electronically Signed   By: Danae Orleans M.D.   On: 01/05/2023 13:18    Microbiology: Results for orders placed or performed during the hospital encounter of 09/29/21  Resp Panel by RT-PCR (Flu A&B, Covid) Nasopharyngeal Swab     Status: None   Collection Time: 09/29/21  3:04 PM   Specimen: Nasopharyngeal Swab; Nasopharyngeal(NP) swabs in vial transport medium  Result Value Ref Range Status   SARS Coronavirus 2 by RT PCR NEGATIVE NEGATIVE Final    Comment: (NOTE) SARS-CoV-2 target nucleic acids are NOT DETECTED.  The SARS-CoV-2 RNA is generally detectable in upper respiratory specimens during the acute phase of infection. The lowest concentration of SARS-CoV-2 viral copies this assay can detect is 138 copies/mL. A negative result does not preclude SARS-Cov-2 infection and should not be used as the sole basis for treatment or other patient management decisions. A negative result may occur with  improper specimen collection/handling, submission  of specimen other than nasopharyngeal swab, presence of viral mutation(s) within the areas targeted by this assay, and inadequate number of viral copies(<138 copies/mL). A negative result must be combined with clinical observations, patient history, and epidemiological information. The expected result is Negative.  Fact Sheet for Patients:  BloggerCourse.com  Fact Sheet for Healthcare Providers:  SeriousBroker.it  This test is no t yet approved or cleared by the [place] FDA and  has been authorized for detection and/or diagnosis of SARS-CoV-2 by FDA under an Emergency Use Authorization (EUA). This EUA will remain  in effect (meaning this test can be used) for the duration of the COVID-19 declaration under Section 564(b)(1) of the Act, 21 U.S.C.section 360bbb-3(b)(1), unless the authorization is terminated  or revoked sooner.       Influenza A by PCR NEGATIVE NEGATIVE Final   Influenza B by PCR NEGATIVE NEGATIVE Final    Comment: (NOTE) The Xpert Xpress SARS-CoV-2/FLU/RSV plus assay is intended as an aid in the diagnosis of influenza from Nasopharyngeal swab specimens and should not  be used as a sole basis for treatment. Nasal washings and aspirates are unacceptable for Xpert Xpress SARS-CoV-2/FLU/RSV testing.  Fact Sheet for Patients: BloggerCourse.com  Fact Sheet for Healthcare Providers: SeriousBroker.it  This test is not yet approved or cleared by the [place] FDA and has been authorized for detection and/or diagnosis of SARS-CoV-2 by FDA under an Emergency Use Authorization (EUA). This EUA will remain in effect (meaning this test can be used) for the duration of the COVID-19 declaration under Section 564(b)(1) of the Act, 21 U.S.C. section 360bbb-3(b)(1), unless the authorization is terminated or revoked.  Performed at [facility], [address] [place], [place] [address]     Labs: CBC: Recent Labs  Lab 01/05/23 1147 01/05/23 1515 01/05/23 2002 01/06/23 0208 01/06/23 0846 01/06/23 2040 01/07/23 0432  WBC 7.2  --   --   --   --   --  6.9  NEUTROABS 4.5  --   --   --   --   --   --   HGB 12.4   < > 13.6 12.4 11.9* 12.2 11.6*  HCT 37.9   < > 41.7 37.6 32.3* 36.1 34.8*  MCV 92.0  --   --   --   --   --  91.3  PLT 232  --   --   --   --   --  215   < > = values in this interval not displayed.   Basic Metabolic Panel: Recent Labs  Lab 01/05/23 1147 01/06/23 0208 01/07/23 0432  NA 143 141 143  K 3.7 3.0* 3.0*  CL 108 109 110  CO2 25 24 26   GLUCOSE 114* 103* 98  BUN 12 9 6*  CREATININE 1.03* 0.91 0.99  CALCIUM 8.7* 8.5* 8.3*  MG 2.1  --   --    Liver Function Tests: Recent Labs  Lab 01/05/23 1147 01/06/23 0208  AST 19 14*  ALT 9 9  ALKPHOS 79 71  BILITOT 0.7 0.9  PROT 6.6 6.3*  ALBUMIN 3.3* 3.2*   CBG: No results for input(s): "GLUCAP" in the last 168 hours.  Discharge time spent: greater than 30 minutes.  Signed: Alford Highland, MD [facility] 01/07/2023

## 2023-01-08 ENCOUNTER — Encounter: Payer: Self-pay | Admitting: Gastroenterology

## 2023-01-29 ENCOUNTER — Emergency Department
Admission: EM | Admit: 2023-01-29 | Discharge: 2023-01-29 | Disposition: A | Discharge status: Home / Self Care | Payer: 59 | Attending: Emergency Medicine | Admitting: Emergency Medicine

## 2023-01-29 ENCOUNTER — Encounter: Payer: Self-pay | Admitting: Gastroenterology

## 2023-01-29 ENCOUNTER — Emergency Department: Payer: 59

## 2023-01-29 ENCOUNTER — Other Ambulatory Visit: Payer: Self-pay

## 2023-01-29 DIAGNOSIS — N189 Chronic kidney disease, unspecified: Secondary | ICD-10-CM | POA: Insufficient documentation

## 2023-01-29 DIAGNOSIS — E1122 Type 2 diabetes mellitus with diabetic chronic kidney disease: Secondary | ICD-10-CM | POA: Diagnosis not present

## 2023-01-29 DIAGNOSIS — R0789 Other chest pain: Secondary | ICD-10-CM | POA: Insufficient documentation

## 2023-01-29 DIAGNOSIS — Z7901 Long term (current) use of anticoagulants: Secondary | ICD-10-CM | POA: Insufficient documentation

## 2023-01-29 DIAGNOSIS — R1013 Epigastric pain: Secondary | ICD-10-CM | POA: Insufficient documentation

## 2023-01-29 DIAGNOSIS — R110 Nausea: Secondary | ICD-10-CM | POA: Insufficient documentation

## 2023-01-29 LAB — COMPREHENSIVE METABOLIC PANEL
ALT: 9 U/L (ref 0–44)
AST: 14 U/L — ABNORMAL LOW (ref 15–41)
Albumin: 3.4 g/dL — ABNORMAL LOW (ref 3.5–5.0)
Alkaline Phosphatase: 72 U/L (ref 38–126)
Anion gap: 6 (ref 5–15)
BUN: 13 mg/dL (ref 8–23)
CO2: 24 mmol/L (ref 22–32)
Calcium: 8.4 mg/dL — ABNORMAL LOW (ref 8.9–10.3)
Chloride: 110 mmol/L (ref 98–111)
Creatinine, Ser: 0.95 mg/dL (ref 0.44–1.00)
GFR, Estimated: >60 mL/min (ref >60)
Glucose, Bld: 95 mg/dL (ref 70–99)
Potassium: 3.6 mmol/L (ref 3.5–5.1)
Sodium: 140 mmol/L (ref 135–145)
Total Bilirubin: 0.9 mg/dL (ref 0.3–1.2)
Total Protein: 6.5 g/dL (ref 6.5–8.1)

## 2023-01-29 LAB — CBC
HCT: 32.1 % — ABNORMAL LOW (ref 36.0–46.0)
Hemoglobin: 11.2 g/dL — ABNORMAL LOW (ref 12.0–15.0)
MCH: 32.8 pg (ref 26.0–34.0)
MCHC: 34.9 g/dL (ref 30.0–36.0)
MCV: 94.1 fL (ref 80.0–100.0)
Platelets: 215 10*3/uL (ref 150–400)
RBC: 3.41 MIL/uL — ABNORMAL LOW (ref 3.87–5.11)
RDW: 14.8 % (ref 11.5–15.5)
WBC: 8.9 10*3/uL (ref 4.0–10.5)
nRBC: 0 % (ref 0.0–0.2)

## 2023-01-29 LAB — TROPONIN I (HIGH SENSITIVITY): Troponin I (High Sensitivity): 12 ng/L (ref <18)

## 2023-01-29 MED ORDER — ACETAMINOPHEN 325 MG PO TABS
650.0000 mg | ORAL_TABLET | Freq: Once | ORAL | Status: AC
  Administered 2023-01-29: 650 mg via ORAL
  Filled 2023-01-29: qty 2

## 2023-01-29 NOTE — ED Provider Notes (Signed)
[facility] Provider Note    Event Date/Time   First MD Initiated Contact with Patient 01/29/23 [phone]     (approximate)   History   Chest Pain   HPI  Tracy Dickerson is a 82 y.o. female with a history of diabetes, CKD, paroxysmal atrial fibrillation on Eliquis, history of possible GI bleed last month who presents with complaints of primarily nausea, epigastric discomfort and some chest discomfort as well.  She says that she has had a heart attack in the past however she is somewhat of a poor historian.  Reports normal stools.   Physical Exam   Triage Vital Signs: ED Triage Vitals [01/29/23 1022]  Enc Vitals Group     BP 137/71     Pulse Rate 71     Resp 19     Temp 97.6 F (36.4 C)     Temp Source Oral     SpO2      Weight      Height      Head Circumference      Peak Flow      Pain Score 0     Pain Loc      Pain Edu?      Excl. in GC?     Most recent vital signs: Vitals:   01/29/23 1100 01/29/23 1130  BP: 135/66 (!) 143/96  Pulse: (!) 42 74  Resp: (!) 22 20  Temp:    SpO2: 96% 96%     General: Awake, no distress.  CV:  Good peripheral perfusion.  Resp:  Normal effort.  Abd:  No distention.  Soft, nontender Other:     ED Results / Procedures / Treatments   Labs (all labs ordered are listed, but only abnormal results are displayed) Labs Reviewed  CBC - Abnormal; Notable for the following components:      Result Value   RBC 3.41 (*)    Hemoglobin 11.2 (*)    HCT 32.1 (*)    All other components within normal limits  COMPREHENSIVE METABOLIC PANEL - Abnormal; Notable for the following components:   Calcium 8.4 (*)    Albumin 3.4 (*)    AST 14 (*)    All other components within normal limits  TROPONIN I (HIGH SENSITIVITY)     EKG  ED ECG REPORT I, Jene Every, the attending physician, personally viewed and interpreted this ECG.  Date: 01/29/2023  Rhythm: normal sinus rhythm QRS Axis: normal Intervals:  normal ST/T Wave abnormalities: normal Narrative Interpretation: [place]    RADIOLOGY Chest x-ray viewed interpreted by me, no acute abnormality    PROCEDURES:  Critical Care performed:   Procedures   MEDICATIONS ORDERED IN ED: Medications  acetaminophen (TYLENOL) tablet 650 mg (650 mg Oral Given 01/29/23 1113)     IMPRESSION / MDM / ASSESSMENT AND PLAN / ED COURSE  I reviewed the triage vital signs and the nursing notes. Patient's presentation is most consistent with acute presentation with potential threat to life or bodily function.  Patient presents with epigastric pain, nausea, some chest discomfort as detailed above.  Differential includes gastritis, GERD, ACS/angina  Overall patient is well-appearing, pending labs, EKG is generally reassuring, chest x-ray without evidence of pneumonia. ----------------------------------------- 12:14 PM on 01/29/2023 -----------------------------------------  Lab work is unremarkable, on reexam patient feels well and has no complaints, she is appropriate for discharge at this time, outpatient follow-up with PCP as needed, return precautions discussed.      FINAL CLINICAL IMPRESSION(S) /  ED DIAGNOSES   Final diagnoses:  Atypical chest pain     Rx / DC Orders   ED Discharge Orders     None        Note:  This document was prepared using Dragon voice recognition software and may include unintentional dictation errors.   Jene Every, MD 01/29/23 1214

## 2023-01-29 NOTE — ED Triage Notes (Signed)
Pt arrives via [facility] from home w/c/o  "having trouble with her breath for a couple months." Pt states she is having trouble catching her breath. Pt states she does not have issues with her breathing at night and sleeps in a twin bed with only 1 pillow. Pt denies CP, n/v. Hx of smoking and states she quit in 2005.

## 2023-02-10 ENCOUNTER — Emergency Department
Admission: EM | Admit: 2023-02-10 | Discharge: 2023-02-10 | Disposition: A | Discharge status: Home / Self Care | Payer: 59 | Attending: Emergency Medicine | Admitting: Emergency Medicine

## 2023-02-10 ENCOUNTER — Other Ambulatory Visit: Payer: Self-pay

## 2023-02-10 ENCOUNTER — Encounter: Payer: Self-pay | Admitting: Emergency Medicine

## 2023-02-10 DIAGNOSIS — S40862A Insect bite (nonvenomous) of left upper arm, initial encounter: Secondary | ICD-10-CM | POA: Diagnosis not present

## 2023-02-10 DIAGNOSIS — W57XXXA Bitten or stung by nonvenomous insect and other nonvenomous arthropods, initial encounter: Secondary | ICD-10-CM | POA: Diagnosis not present

## 2023-02-10 DIAGNOSIS — S80861A Insect bite (nonvenomous), right lower leg, initial encounter: Secondary | ICD-10-CM | POA: Diagnosis not present

## 2023-02-10 DIAGNOSIS — S1096XA Insect bite of unspecified part of neck, initial encounter: Secondary | ICD-10-CM | POA: Diagnosis not present

## 2023-02-10 DIAGNOSIS — S80862A Insect bite (nonvenomous), left lower leg, initial encounter: Secondary | ICD-10-CM | POA: Insufficient documentation

## 2023-02-10 DIAGNOSIS — S40861A Insect bite (nonvenomous) of right upper arm, initial encounter: Secondary | ICD-10-CM | POA: Diagnosis present

## 2023-02-10 MED ORDER — HYDROXYZINE HCL 10 MG PO TABS: 10.0000 mg | ORAL_TABLET | Freq: Two times a day (BID) | ORAL | 0 refills | Status: DC | PRN

## 2023-02-10 MED ORDER — PERMETHRIN 5 % EX CREA: 1.0000 | TOPICAL_CREAM | Freq: Once | CUTANEOUS | 0 refills | Status: AC

## 2023-02-10 NOTE — ED Notes (Signed)
Dc instructions and scripts reviewed with pt. Pt wheeled outside to wait for taxi to transport home.

## 2023-02-10 NOTE — ED Triage Notes (Signed)
Pt bib EMS for complaints of Headache for 3-4 weeks. Per EMS pt covered in bed bug bites. Pt was placed in clean gown and covered in bag by EMS. Pt has not been treated for bed bugs.

## 2023-02-10 NOTE — ED Provider Notes (Signed)
[facility] Provider Note    Event Date/Time   First MD Initiated Contact with Patient 02/10/23 1222     (approximate)   History   Headache and Insect Bite   HPI  Tracy Dickerson is a 82 y.o. female  here with multiple areas of insect bites. Pt reports that she is here primarily because she has had itchy, red, painful rash to her bilateral UE and LE for the past week. She went and bought furniture several weeks ago, has has itching and rash since then. She has had a mild headache as well but this has been chronic and she takes daily meds for this. It is not acutely worse. No other complaints. No new meds or other exposures. Rash is pruritic, diffuse.  No focal numbness or weakness. No vision changes.      Physical Exam   Triage Vital Signs: ED Triage Vitals  Enc Vitals Group     BP 02/10/23 1226 118/82     Pulse Rate 02/10/23 1226 76     Resp 02/10/23 1226 16     Temp 02/10/23 1226 97.9 F (36.6 C)     Temp Source 02/10/23 1226 Oral     SpO2 02/10/23 1226 96 %     Weight 02/10/23 1230 169 lb 12.1 oz (77 kg)     Height 02/10/23 1230 5\' 6"  (1.676 m)     Head Circumference --      Peak Flow --      Pain Score --      Pain Loc --      Pain Edu? --      Excl. in GC? --     Most recent vital signs: Vitals:   02/10/23 1226  BP: 118/82  Pulse: 76  Resp: 16  Temp: 97.9 F (36.6 C)  SpO2: 96%     General: Awake, no distress.  CV:  Good peripheral perfusion. RRR. Resp:  Normal work of breathing. No murmurs or rubs. Abd:  No distention. No tenderness. Other:  Diffuse pruritic papular rash with insect bites involving b/l UE and LE, trunk, neck. No involvement of palms or soles. No areas of significant induration or fluctuance.    ED Results / Procedures / Treatments   Labs (all labs ordered are listed, but only abnormal results are displayed) Labs Reviewed - No data to display   EKG    RADIOLOGY   I also independently reviewed and  agree with radiologist interpretations.   PROCEDURES:  Critical Care performed: No   MEDICATIONS ORDERED IN ED: Medications - No data to display   IMPRESSION / MDM / ASSESSMENT AND PLAN / ED COURSE  I reviewed the triage vital signs and the nursing notes.                              Differential diagnosis includes, but is not limited to, insect bite, likely bed bug though scabies a consideration, other urticarial rash, hygiene, contact/irritant dermatitis  Patient's presentation is most consistent with acute presentation with potential threat to life or bodily function.  82 yo F here with pruritic diffuse rash. Likely related to bed bugs and pt was found with bugs on her by EMS. DDx includes scabies as she does have some involvement of interdigital spaces. Rash is not c/f meningitis, encephalitis, ro bacterial infection. She has a chronic HA that is not currently active with no signs to suggest  CNS issue/infection. She has no signs of petechiae or purpura. Will give permethrin, atarax PRN and advised her on home infection/bug control practices. No other complaints.     FINAL CLINICAL IMPRESSION(S) / ED DIAGNOSES   Final diagnoses:  Insect bite, unspecified site, initial encounter     Rx / DC Orders   ED Discharge Orders          Ordered    hydrOXYzine (ATARAX) 10 MG tablet  Every 12 hours PRN        02/10/23 1401    permethrin (ELIMITE) 5 % cream   Once        02/10/23 1401             Note:  This document was prepared using Dragon voice recognition software and may include unintentional dictation errors.   Shaune Pollack, MD 02/10/23 [phone]

## 2023-02-25 ENCOUNTER — Emergency Department: Payer: 59

## 2023-02-25 ENCOUNTER — Encounter: Payer: Self-pay | Admitting: Emergency Medicine

## 2023-02-25 ENCOUNTER — Other Ambulatory Visit: Payer: Self-pay

## 2023-02-25 ENCOUNTER — Emergency Department
Admission: EM | Admit: 2023-02-25 | Discharge: 2023-02-25 | Disposition: A | Discharge status: Home / Self Care | Payer: 59 | Attending: Emergency Medicine | Admitting: Emergency Medicine

## 2023-02-25 DIAGNOSIS — S1093XA Contusion of unspecified part of neck, initial encounter: Secondary | ICD-10-CM | POA: Diagnosis not present

## 2023-02-25 DIAGNOSIS — E119 Type 2 diabetes mellitus without complications: Secondary | ICD-10-CM | POA: Diagnosis not present

## 2023-02-25 DIAGNOSIS — I10 Essential (primary) hypertension: Secondary | ICD-10-CM | POA: Diagnosis not present

## 2023-02-25 DIAGNOSIS — S0093XA Contusion of unspecified part of head, initial encounter: Secondary | ICD-10-CM | POA: Insufficient documentation

## 2023-02-25 DIAGNOSIS — S9032XA Contusion of left foot, initial encounter: Secondary | ICD-10-CM | POA: Diagnosis not present

## 2023-02-25 DIAGNOSIS — M25572 Pain in left ankle and joints of left foot: Secondary | ICD-10-CM | POA: Diagnosis present

## 2023-02-25 DIAGNOSIS — Z7901 Long term (current) use of anticoagulants: Secondary | ICD-10-CM | POA: Insufficient documentation
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
## 2023-02-25 NOTE — Discharge Instructions (Signed)
      Interpersonal Violence   Interpersonal Violence aka Domestic Violence is defined as violence between people who have had a personal relationship. For example, someone you have ever dated, been married to or in a domestic partnership with. Someone with whom you have a child in common, or a current  household member.  Does one or more of the following  attempts to cause bodily injury, or intentionally causes bodily injury; places you or a member of your family or household in fear of imminent serious bodily injury; continued harassment that rises to such a level as to inflict substantial emotional distress; or commits any rape or sexual offense  Coerces you to give them money or other items You are not alone. Unfortunately domestic violence is very common. Domestic violence does not go away on its own and tends to get worse over time and more frequent. There are people who can help. There are resources included in these instructions. Evidence can be collected in case you want to notify law enforcement now or in the future. A forensic nurse can take photographs and create a medical/legal document of the incident. If you choose to report to law enforcement, they will request a copy of the chart which we can provide with your permission. We can call in social work or an advocate to help with safety planning and emergency placement in a shelter if you have no other safe options.  THE POLICE CAN HELP YOU:  Get to a safe place away from the violence.  Get information on how the court can help protect you against the violence.  Get necessary belongings from your home for you and your children.  Get copies of police reports about the violence.  File a complaint in criminal court.  Find where local criminal and family courts are located.  The [facility] Can Help You Safety Planning Assistance with Shelter Obtaining a Engineer, maintenance (IT) (50B) Research officer, political party Support  Group Environmental consultant with domestic violence related criminal charges Child Youth worker Assistance Enrollment Job Readiness Budget Counseling  Coaching and Mentoring  Call your local domestic violence program for additional information and support. A referral has been made on your behalf.  [place] [facility]   336-641-SAFE Crisis Line 8101665829 [facility] [place]   917-267-8312 Crisis Line 331-063-1501 [facility] [phone]  National Domestic Violence Abuse Hotline  262-399-5059  [place] Crime Victim's Compensation:  The state advocates (contact information on flyer) or local advocates from a [facility] may be able to assist with completing the application; in order to be considered for assistance; the crime must be reported to law enforcement within 72 hours unless there is good cause for delay; you must fully cooperate with law enforcement and prosecution regarding the case; the crime must have occurred in [place] or in a state that does not offer crime victim compensation. RecruitSuit.ca
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
## 2023-02-25 NOTE — SANE Note (Signed)
The SANE/FNE Teacher, music) consult has been completed. The Ginny, EMT and S. Fisher have been notified. Please contact the SANE/FNE nurse on call (listed in Amion) with any further concerns.
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
## 2023-02-25 NOTE — ED Notes (Signed)
Pt. To up to bathroom with stand by assist. Pt. Ambulated without assistance, gait steady, NAD

## 2023-03-25 ENCOUNTER — Other Ambulatory Visit: Payer: Self-pay

## 2023-03-25 ENCOUNTER — Emergency Department
Admission: EM | Admit: 2023-03-25 | Discharge: 2023-03-25 | Disposition: A | Discharge status: Home / Self Care | Payer: 59 | Attending: Emergency Medicine | Admitting: Emergency Medicine

## 2023-03-25 DIAGNOSIS — E119 Type 2 diabetes mellitus without complications: Secondary | ICD-10-CM | POA: Diagnosis not present

## 2023-03-25 DIAGNOSIS — Z7901 Long term (current) use of anticoagulants: Secondary | ICD-10-CM | POA: Insufficient documentation

## 2023-03-25 DIAGNOSIS — W57XXXA Bitten or stung by nonvenomous insect and other nonvenomous arthropods, initial encounter: Secondary | ICD-10-CM | POA: Diagnosis not present

## 2023-03-25 DIAGNOSIS — L905 Scar conditions and fibrosis of skin: Secondary | ICD-10-CM | POA: Insufficient documentation

## 2023-03-25 DIAGNOSIS — I10 Essential (primary) hypertension: Secondary | ICD-10-CM | POA: Diagnosis not present

## 2023-03-25 MED ORDER — AQUAPHOR EX OINT: TOPICAL_OINTMENT | CUTANEOUS | 0 refills | Status: DC | PRN

## 2023-03-25 MED ORDER — CETIRIZINE HCL 5 MG PO TABS: 5.0000 mg | ORAL_TABLET | Freq: Every day | ORAL | 2 refills | Status: DC

## 2023-03-25 MED ORDER — HYDROCORTISONE 0.5 % EX CREA: 1.0000 | TOPICAL_CREAM | Freq: Two times a day (BID) | CUTANEOUS | 0 refills | Status: DC
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
## 2023-03-25 NOTE — Discharge Instructions (Signed)
Apply Aquaphor ointment to your scar daily.  Call your primary doctor or your dermatologist for a follow-up appointment to reassess the discomfort associated with your scar.  For your bedbugs, call an exterminator to get rid of bedbugs in your home.  Use hydrocortisone cream and apply to the area of itching from bedbug bites and you may take cetirizine antihistamine by mouth daily for itching as well.  If you experience any new, worsening, or unexpected symptoms call your doctor right away or come back to the emergency department for reevaluation.

## 2023-03-25 NOTE — ED Notes (Signed)
Pt changed into gown and two bed bugs were found on pt clothing. Pt was washed down thoroughly and no bugs to be found.
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
## 2023-04-05 ENCOUNTER — Emergency Department: Payer: 59

## 2023-04-05 ENCOUNTER — Encounter: Payer: Self-pay | Admitting: Emergency Medicine

## 2023-04-05 ENCOUNTER — Other Ambulatory Visit: Payer: Self-pay

## 2023-04-05 ENCOUNTER — Emergency Department
Admission: EM | Admit: 2023-04-05 | Discharge: 2023-04-05 | Disposition: A | Discharge status: Home / Self Care | Payer: 59 | Attending: Emergency Medicine | Admitting: Emergency Medicine

## 2023-04-05 DIAGNOSIS — Z7901 Long term (current) use of anticoagulants: Secondary | ICD-10-CM | POA: Diagnosis not present

## 2023-04-05 DIAGNOSIS — R079 Chest pain, unspecified: Secondary | ICD-10-CM | POA: Insufficient documentation

## 2023-04-05 DIAGNOSIS — N189 Chronic kidney disease, unspecified: Secondary | ICD-10-CM | POA: Diagnosis not present

## 2023-04-05 DIAGNOSIS — I129 Hypertensive chronic kidney disease with stage 1 through stage 4 chronic kidney disease, or unspecified chronic kidney disease: Secondary | ICD-10-CM | POA: Diagnosis not present

## 2023-04-05 DIAGNOSIS — E1122 Type 2 diabetes mellitus with diabetic chronic kidney disease: Secondary | ICD-10-CM | POA: Diagnosis not present

## 2023-04-05 LAB — BASIC METABOLIC PANEL
Anion gap: 7 (ref 5–15)
BUN: 15 mg/dL (ref 8–23)
CO2: 23 mmol/L (ref 22–32)
Calcium: 8.8 mg/dL — ABNORMAL LOW (ref 8.9–10.3)
Chloride: 109 mmol/L (ref 98–111)
Creatinine, Ser: 0.97 mg/dL (ref 0.44–1.00)
GFR, Estimated: 59 mL/min — ABNORMAL LOW (ref >60)
Glucose, Bld: 112 mg/dL — ABNORMAL HIGH (ref 70–99)
Potassium: 4.1 mmol/L (ref 3.5–5.1)
Sodium: 139 mmol/L (ref 135–145)

## 2023-04-05 LAB — CBC WITH DIFFERENTIAL/PLATELET
Abs Immature Granulocytes: 0.02 10*3/uL (ref 0.00–0.07)
Basophils Absolute: 0 10*3/uL (ref 0.0–0.1)
Basophils Relative: 1 %
Eosinophils Absolute: 0.4 10*3/uL (ref 0.0–0.5)
Eosinophils Relative: 5 %
HCT: 34.9 % — ABNORMAL LOW (ref 36.0–46.0)
Hemoglobin: 11.2 g/dL — ABNORMAL LOW (ref 12.0–15.0)
Immature Granulocytes: 0 %
Lymphocytes Relative: 22 %
Lymphs Abs: 1.5 10*3/uL (ref 0.7–4.0)
MCH: 28.6 pg (ref 26.0–34.0)
MCHC: 32.1 g/dL (ref 30.0–36.0)
MCV: 89.3 fL (ref 80.0–100.0)
Monocytes Absolute: 0.5 10*3/uL (ref 0.1–1.0)
Monocytes Relative: 7 %
Neutro Abs: 4.6 10*3/uL (ref 1.7–7.7)
Neutrophils Relative %: 65 %
Platelets: 215 10*3/uL (ref 150–400)
RBC: 3.91 MIL/uL (ref 3.87–5.11)
RDW: 14.6 % (ref 11.5–15.5)
WBC: 7 10*3/uL (ref 4.0–10.5)
nRBC: 0 % (ref 0.0–0.2)

## 2023-04-05 LAB — TROPONIN I (HIGH SENSITIVITY): Troponin I (High Sensitivity): 8 ng/L (ref <18)

## 2023-04-05 MED ORDER — LIDOCAINE 5 % EX PTCH: 1.0000 | MEDICATED_PATCH | Freq: Two times a day (BID) | CUTANEOUS | 0 refills | Status: DC

## 2023-04-05 NOTE — ED Provider Notes (Signed)
[facility] Provider Note    Event Date/Time   First MD Initiated Contact with Patient 04/05/23 1249     (approximate)   History   Chief Complaint Chest Pain   HPI  Tracy Dickerson is a 82 y.o. female with past medical history of hypertension, diabetes, CKD, and atrial fibrillation on Eliquis who presents to the ED complaining of chest pain.  Patient reports that she has been dealing with pain in the left side of her chest "for a long time."  She attributes it to her prior mastectomy, states she was taking tramadol for this pain before but has not had it for a few months.  She denies any associated fevers, cough, or difficulty breathing.  She has not noticed any pain or swelling in her legs.  EMS reports patient found to have bedbugs during transport.     Physical Exam   Triage Vital Signs: ED Triage Vitals  Encounter Vitals Group     BP 04/05/23 1252 (!) 142/67     Systolic BP Percentile --      Diastolic BP Percentile --      Pulse Rate 04/05/23 1252 80     Resp 04/05/23 1252 18     Temp 04/05/23 1254 97.9 F (36.6 C)     Temp Source 04/05/23 1254 Oral     SpO2 04/05/23 1252 100 %     Weight --      Height --      Head Circumference --      Peak Flow --      Pain Score 04/05/23 1253 10     Pain Loc --      Pain Education --      Exclude from Growth Chart --     Most recent vital signs: Vitals:   04/05/23 1254 04/05/23 1330  BP:  128/75  Pulse:  (!) 55  Resp:  19  Temp: 97.9 F (36.6 C)   SpO2:  100%    Constitutional: Alert and oriented. Eyes: Conjunctivae are normal. Head: Atraumatic. Nose: No congestion/rhinnorhea. Mouth/Throat: Mucous membranes are moist. Cardiovascular: Normal rate, regular rhythm. Grossly normal heart sounds.  2+ radial pulses bilaterally. Respiratory: Normal respiratory effort.  No retractions. Lungs CTAB.  Tenderness over the left side of her chest reproducing reported pain. Gastrointestinal: Soft and  nontender. No distention. Musculoskeletal: No lower extremity tenderness nor edema.  Neurologic:  Normal speech and language. No gross focal neurologic deficits are appreciated.    ED Results / Procedures / Treatments   Labs (all labs ordered are listed, but only abnormal results are displayed) Labs Reviewed  CBC WITH DIFFERENTIAL/PLATELET - Abnormal; Notable for the following components:      Result Value   Hemoglobin 11.2 (*)    HCT 34.9 (*)    All other components within normal limits  BASIC METABOLIC PANEL - Abnormal; Notable for the following components:   Glucose, Bld 112 (*)    Calcium 8.8 (*)    GFR, Estimated 59 (*)    All other components within normal limits  TROPONIN I (HIGH SENSITIVITY)     EKG  ED ECG REPORT I, Chesley Noon, the attending physician, personally viewed and interpreted this ECG.   Date: 04/05/2023  EKG Time: 12:52  Rate: 82  Rhythm: Sinus arrhythmia  Axis: Normal  Intervals:none  ST&T Change: None  RADIOLOGY Chest x-ray reviewed and interpreted by me with no infiltrate, edema, or effusion.  PROCEDURES:  Critical Care performed:  No  Procedures   MEDICATIONS ORDERED IN ED: Medications - No data to display   IMPRESSION / MDM / ASSESSMENT AND PLAN / ED COURSE  I reviewed the triage vital signs and the nursing notes.                              82 y.o. female with past medical history of hypertension, diabetes, CKD, and atrial fibrillation on Eliquis who presents to the ED complaining of constant pain over the left side of her chest for multiple months.  Patient's presentation is most consistent with acute presentation with potential threat to life or bodily function.  Differential diagnosis includes, but is not limited to, ACS, PE, dissection, pneumonia, pneumothorax, musculoskeletal pain, GERD, anxiety.  Patient nontoxic-appearing and in no acute distress, vital signs are unremarkable.  EKG shows sinus arrhythmia with no  ischemic changes and symptoms seem atypical for ACS, have been present chronically and are reproducible with palpation.  We will further assess with labs and chest x-ray.  Chest x-ray is unremarkable, labs are reassuring with no significant anemia, leukocytosis, lecture abnormality, or AKI.  Troponin within normal limits and I doubt ACS given chronic chest discomfort.  Suspect musculoskeletal etiology for patient's pain and she is appropriate for discharge home with PCP follow-up.  She was counseled to return to the ED for new or worsening symptoms.  Patient agrees with plan.      FINAL CLINICAL IMPRESSION(S) / ED DIAGNOSES   Final diagnoses:  Nonspecific chest pain     Rx / DC Orders   ED Discharge Orders          Ordered    lidocaine (LIDODERM) 5 %  Every 12 hours        04/05/23 1416             Note:  This document was prepared using Dragon voice recognition software and may include unintentional dictation errors.   Chesley Noon, MD 04/05/23 [phone]

## 2023-04-05 NOTE — ED Triage Notes (Signed)
Patient to ED via [facility] from home for intermittent CP. Pain ongoing x3 days. Has not been taking her tramadol due to being out x4 months- states that what she normally takes for pain. Afib between 80-120 with EMS. No history of same. EMS noted home to have bed bugs.

## 2023-04-05 NOTE — ED Notes (Signed)
Patient noted to have a bed bug on self. Patient's clothing from home removed and placed in patient belonging bags. Patient in hospital gown and placed in clean brief.

## 2023-04-19 ENCOUNTER — Other Ambulatory Visit: Payer: Self-pay

## 2023-04-19 ENCOUNTER — Emergency Department
Admission: EM | Admit: 2023-04-19 | Discharge: 2023-04-19 | Disposition: A | Discharge status: Home / Self Care | Payer: 59 | Source: Home / Self Care | Attending: Emergency Medicine | Admitting: Emergency Medicine

## 2023-04-19 ENCOUNTER — Emergency Department: Payer: 59

## 2023-04-19 DIAGNOSIS — E1122 Type 2 diabetes mellitus with diabetic chronic kidney disease: Secondary | ICD-10-CM | POA: Insufficient documentation

## 2023-04-19 DIAGNOSIS — R21 Rash and other nonspecific skin eruption: Secondary | ICD-10-CM | POA: Diagnosis not present

## 2023-04-19 DIAGNOSIS — R079 Chest pain, unspecified: Secondary | ICD-10-CM | POA: Diagnosis not present

## 2023-04-19 DIAGNOSIS — I129 Hypertensive chronic kidney disease with stage 1 through stage 4 chronic kidney disease, or unspecified chronic kidney disease: Secondary | ICD-10-CM | POA: Diagnosis not present

## 2023-04-19 DIAGNOSIS — N189 Chronic kidney disease, unspecified: Secondary | ICD-10-CM | POA: Diagnosis not present

## 2023-04-19 DIAGNOSIS — W57XXXA Bitten or stung by nonvenomous insect and other nonvenomous arthropods, initial encounter: Secondary | ICD-10-CM | POA: Insufficient documentation

## 2023-04-19 DIAGNOSIS — Z7901 Long term (current) use of anticoagulants: Secondary | ICD-10-CM | POA: Diagnosis not present

## 2023-04-19 LAB — TROPONIN I (HIGH SENSITIVITY)
Troponin I (High Sensitivity): 10 ng/L (ref <18)
Troponin I (High Sensitivity): 12 ng/L (ref <18)

## 2023-04-19 LAB — COMPREHENSIVE METABOLIC PANEL
ALT: 10 U/L (ref 0–44)
AST: 16 U/L (ref 15–41)
Albumin: 3.5 g/dL (ref 3.5–5.0)
Alkaline Phosphatase: 64 U/L (ref 38–126)
Anion gap: 7 (ref 5–15)
BUN: 14 mg/dL (ref 8–23)
CO2: 25 mmol/L (ref 22–32)
Calcium: 8.4 mg/dL — ABNORMAL LOW (ref 8.9–10.3)
Chloride: 109 mmol/L (ref 98–111)
Creatinine, Ser: 0.94 mg/dL (ref 0.44–1.00)
GFR, Estimated: >60 mL/min (ref >60)
Glucose, Bld: 80 mg/dL (ref 70–99)
Potassium: 3.3 mmol/L — ABNORMAL LOW (ref 3.5–5.1)
Sodium: 141 mmol/L (ref 135–145)
Total Bilirubin: 0.5 mg/dL (ref 0.3–1.2)
Total Protein: 6.8 g/dL (ref 6.5–8.1)

## 2023-04-19 LAB — D-DIMER, QUANTITATIVE: D-Dimer, Quant: 1.59 ug/mL-FEU — ABNORMAL HIGH (ref 0.00–0.50)

## 2023-04-19 LAB — CBC
HCT: 32.1 % — ABNORMAL LOW (ref 36.0–46.0)
Hemoglobin: 10.7 g/dL — ABNORMAL LOW (ref 12.0–15.0)
MCH: 32.3 pg (ref 26.0–34.0)
MCHC: 33.3 g/dL (ref 30.0–36.0)
MCV: 97 fL (ref 80.0–100.0)
Platelets: 204 10*3/uL (ref 150–400)
RBC: 3.31 MIL/uL — ABNORMAL LOW (ref 3.87–5.11)
RDW: 16.7 % — ABNORMAL HIGH (ref 11.5–15.5)
WBC: 7 10*3/uL (ref 4.0–10.5)
nRBC: 0 % (ref 0.0–0.2)

## 2023-04-19 MED ORDER — NITROGLYCERIN 0.4 MG SL SUBL
0.4000 mg | SUBLINGUAL_TABLET | SUBLINGUAL | Status: DC | PRN
  Administered 2023-04-19 (×2): 0.4 mg via SUBLINGUAL
  Filled 2023-04-19: qty 1

## 2023-04-19 MED ORDER — IOHEXOL 350 MG/ML SOLN
75.0000 mL | Freq: Once | INTRAVENOUS | Status: AC | PRN
  Administered 2023-04-19: 75 mL via INTRAVENOUS

## 2023-04-19 NOTE — ED Provider Notes (Signed)
[facility] Provider Note    Event Date/Time   First MD Initiated Contact with Patient 04/19/23 1209     (approximate)   History   Chest Pain   HPI  Tracy Dickerson is a 82 y.o. female past medical history significant for hypertension, diabetes, CKD, atrial fibrillation on Eliquis, remote history of cancer, who presents to the emergency department with chest pain.  Patient states that she has had chest pain that worsened this morning.  Does state that she has chronic chest pain for a long time.  Worsening today and was unable to drink her coffee.  Currently rates her pain as 10/10.  Patient received aspirin with EMS.  EMS also found bedbugs.  Denies history of DVT or PE.  She is uncertain of her A-fib does not know what she takes for it.     Physical Exam   Triage Vital Signs: ED Triage Vitals  Encounter Vitals Group     BP      Systolic BP Percentile      Diastolic BP Percentile      Pulse      Resp      Temp      Temp src      SpO2      Weight      Height      Head Circumference      Peak Flow      Pain Score      Pain Loc      Pain Education      Exclude from Growth Chart     Most recent vital signs: Vitals:   04/19/23 1330 04/19/23 1345  BP: (!) 160/68   Pulse: 79 83  Resp: 18 17  Temp:    SpO2:      Physical Exam Constitutional:      Appearance: She is well-developed.  HENT:     Head: Atraumatic.  Eyes:     Conjunctiva/sclera: Conjunctivae normal.  Cardiovascular:     Rate and Rhythm: Regular rhythm.  Pulmonary:     Effort: No respiratory distress.  Chest:     Chest wall: No tenderness.  Abdominal:     General: There is no distension.     Tenderness: There is no abdominal tenderness.  Musculoskeletal:        General: Normal range of motion.     Cervical back: Normal range of motion.     Right lower leg: No edema.     Left lower leg: No edema.  Skin:    General: Skin is warm.     Findings: Rash present.      Comments: Bedbugs found on the patient  Neurological:     Mental Status: She is alert. Mental status is at baseline.     IMPRESSION / MDM / ASSESSMENT AND PLAN / ED COURSE  I reviewed the triage vital signs and the nursing notes.  Differential diagnosis including chronic chest pain, ACS, pneumonia, pericarditis, PE.  Low risk Wells criteria will obtain a screening D-dimer   EKG  I, Corena Herter, the attending physician, personally viewed and interpreted this ECG.   Rate: Normal  Rhythm: Normal sinus  Axis: Normal  Intervals: Normal  ST&T Change: None PACs present.  No tachycardic or bradycardic dysrhythmias while on cardiac telemetry.  RADIOLOGY I independently reviewed imaging, my interpretation of imaging: cxr no signs of pneumonia  CTA with no signs of pulmonary embolism.  Aortic aneurysm that is stable and  recommended 3-year follow-up  LABS (all labs ordered are listed, but only abnormal results are displayed) Labs interpreted as -    Labs Reviewed  CBC - Abnormal; Notable for the following components:      Result Value   RBC 3.31 (*)    Hemoglobin 10.7 (*)    HCT 32.1 (*)    RDW 16.7 (*)    All other components within normal limits  COMPREHENSIVE METABOLIC PANEL - Abnormal; Notable for the following components:   Potassium 3.3 (*)    Calcium 8.4 (*)    All other components within normal limits  D-DIMER, QUANTITATIVE - Abnormal; Notable for the following components:   D-Dimer, Quant 1.59 (*)    All other components within normal limits  TROPONIN I (HIGH SENSITIVITY)  TROPONIN I (HIGH SENSITIVITY)     MDM    Low risk Wells criteria, elevated D-dimer so obtain CTA.  CTA without signs of pulmonary embolism.  Chronic anemia.  Creatinine at baseline.  Mild hypokalemia.  Troponin negative.  Low suspicion for ACS.  If serial troponins are negative will discharge home.  Given information about her bedbugs.  Given return precautions for any worsening  symptoms.   PROCEDURES:  Critical Care performed: No  Procedures  Patient's presentation is most consistent with acute presentation with potential threat to life or bodily function.   MEDICATIONS ORDERED IN ED: Medications  nitroGLYCERIN (NITROSTAT) SL tablet 0.4 mg (0.4 mg Sublingual Given 04/19/23 1253)  iohexol (OMNIPAQUE) 350 MG/ML injection 75 mL (75 mLs Intravenous Contrast Given 04/19/23 1454)    FINAL CLINICAL IMPRESSION(S) / ED DIAGNOSES   Final diagnoses:  Chest pain, unspecified type  Bedbug bite, initial encounter     Rx / DC Orders   ED Discharge Orders     None        Note:  This document was prepared using Dragon voice recognition software and may include unintentional dictation errors.   Corena Herter, MD 04/19/23 1555

## 2023-04-19 NOTE — ED Triage Notes (Signed)
Pt came in via ems from home due to chest pain x1 day. Pt given 324mg  aspirin via ems pta.

## 2023-04-19 NOTE — Discharge Instructions (Signed)
You are seen in the emergency department for your chest pain.  You had CT scans that did not show any signs of a blood clot.  You are not having any heart attack today, your heart enzymes were normal.  You had a stable aortic aneurysm and you need repeat CT scan in 3 years.  You were found to have bedbugs in the emergency department.  You can take Benadryl as needed for itching.  Use hot water to wash all of your bedding and clothing.  Follow-up with your primary care physician to further discuss any bedbug issues.

## 2023-06-18 ENCOUNTER — Emergency Department: Payer: 59

## 2023-06-18 ENCOUNTER — Observation Stay
Admission: EM | Admit: 2023-06-18 | Discharge: 2023-06-20 | Disposition: A | Discharge status: Home / Self Care | Payer: 59 | Attending: Obstetrics and Gynecology | Admitting: Obstetrics and Gynecology

## 2023-06-18 DIAGNOSIS — B888 Other specified infestations: Secondary | ICD-10-CM | POA: Insufficient documentation

## 2023-06-18 DIAGNOSIS — E1169 Type 2 diabetes mellitus with other specified complication: Secondary | ICD-10-CM | POA: Diagnosis present

## 2023-06-18 DIAGNOSIS — J449 Chronic obstructive pulmonary disease, unspecified: Secondary | ICD-10-CM | POA: Diagnosis present

## 2023-06-18 DIAGNOSIS — Z8719 Personal history of other diseases of the digestive system: Secondary | ICD-10-CM

## 2023-06-18 DIAGNOSIS — B964 Proteus (mirabilis) (morganii) as the cause of diseases classified elsewhere: Secondary | ICD-10-CM | POA: Insufficient documentation

## 2023-06-18 DIAGNOSIS — Z85118 Personal history of other malignant neoplasm of bronchus and lung: Secondary | ICD-10-CM | POA: Insufficient documentation

## 2023-06-18 DIAGNOSIS — D649 Anemia, unspecified: Secondary | ICD-10-CM

## 2023-06-18 DIAGNOSIS — Z7901 Long term (current) use of anticoagulants: Secondary | ICD-10-CM | POA: Diagnosis not present

## 2023-06-18 DIAGNOSIS — Z79899 Other long term (current) drug therapy: Secondary | ICD-10-CM | POA: Diagnosis not present

## 2023-06-18 DIAGNOSIS — I129 Hypertensive chronic kidney disease with stage 1 through stage 4 chronic kidney disease, or unspecified chronic kidney disease: Secondary | ICD-10-CM | POA: Insufficient documentation

## 2023-06-18 DIAGNOSIS — E538 Deficiency of other specified B group vitamins: Secondary | ICD-10-CM | POA: Insufficient documentation

## 2023-06-18 DIAGNOSIS — N390 Urinary tract infection, site not specified: Principal | ICD-10-CM | POA: Diagnosis present

## 2023-06-18 DIAGNOSIS — F0390 Unspecified dementia without behavioral disturbance: Secondary | ICD-10-CM | POA: Insufficient documentation

## 2023-06-18 DIAGNOSIS — Z7984 Long term (current) use of oral hypoglycemic drugs: Secondary | ICD-10-CM | POA: Diagnosis not present

## 2023-06-18 DIAGNOSIS — N3000 Acute cystitis without hematuria: Principal | ICD-10-CM | POA: Insufficient documentation

## 2023-06-18 DIAGNOSIS — R531 Weakness: Secondary | ICD-10-CM | POA: Diagnosis present

## 2023-06-18 DIAGNOSIS — Z5919 Other inadequate housing: Secondary | ICD-10-CM

## 2023-06-18 DIAGNOSIS — E1122 Type 2 diabetes mellitus with diabetic chronic kidney disease: Secondary | ICD-10-CM | POA: Diagnosis not present

## 2023-06-18 DIAGNOSIS — Z87891 Personal history of nicotine dependence: Secondary | ICD-10-CM | POA: Insufficient documentation

## 2023-06-18 DIAGNOSIS — E785 Hyperlipidemia, unspecified: Secondary | ICD-10-CM | POA: Insufficient documentation

## 2023-06-18 DIAGNOSIS — N1831 Chronic kidney disease, stage 3a: Secondary | ICD-10-CM | POA: Diagnosis not present

## 2023-06-18 DIAGNOSIS — I10 Essential (primary) hypertension: Secondary | ICD-10-CM | POA: Diagnosis present

## 2023-06-18 DIAGNOSIS — I6529 Occlusion and stenosis of unspecified carotid artery: Secondary | ICD-10-CM | POA: Insufficient documentation

## 2023-06-18 DIAGNOSIS — Z853 Personal history of malignant neoplasm of breast: Secondary | ICD-10-CM | POA: Insufficient documentation

## 2023-06-18 DIAGNOSIS — D62 Acute posthemorrhagic anemia: Secondary | ICD-10-CM | POA: Diagnosis not present

## 2023-06-18 DIAGNOSIS — I480 Paroxysmal atrial fibrillation: Secondary | ICD-10-CM | POA: Diagnosis not present

## 2023-06-18 DIAGNOSIS — G4730 Sleep apnea, unspecified: Secondary | ICD-10-CM | POA: Diagnosis present

## 2023-06-18 LAB — COMPREHENSIVE METABOLIC PANEL
ALT: 9 U/L (ref 0–44)
AST: 16 U/L (ref 15–41)
Albumin: 3.2 g/dL — ABNORMAL LOW (ref 3.5–5.0)
Alkaline Phosphatase: 65 U/L (ref 38–126)
Anion gap: 6 (ref 5–15)
BUN: 22 mg/dL (ref 8–23)
CO2: 26 mmol/L (ref 22–32)
Calcium: 8.3 mg/dL — ABNORMAL LOW (ref 8.9–10.3)
Chloride: 107 mmol/L (ref 98–111)
Creatinine, Ser: 0.93 mg/dL (ref 0.44–1.00)
GFR, Estimated: >60 mL/min (ref >60)
Glucose, Bld: 95 mg/dL (ref 70–99)
Potassium: 4.2 mmol/L (ref 3.5–5.1)
Sodium: 139 mmol/L (ref 135–145)
Total Bilirubin: 0.3 mg/dL (ref 0.3–1.2)
Total Protein: 6.6 g/dL (ref 6.5–8.1)

## 2023-06-18 LAB — CBC WITH DIFFERENTIAL/PLATELET
Abs Immature Granulocytes: 0.03 10*3/uL (ref 0.00–0.07)
Basophils Absolute: 0 10*3/uL (ref 0.0–0.1)
Basophils Relative: 1 %
Eosinophils Absolute: 0.4 10*3/uL (ref 0.0–0.5)
Eosinophils Relative: 4 %
HCT: 29.4 % — ABNORMAL LOW (ref 36.0–46.0)
Hemoglobin: 9 g/dL — ABNORMAL LOW (ref 12.0–15.0)
Immature Granulocytes: 0 %
Lymphocytes Relative: 18 %
Lymphs Abs: 1.5 10*3/uL (ref 0.7–4.0)
MCH: 27.7 pg (ref 26.0–34.0)
MCHC: 30.6 g/dL (ref 30.0–36.0)
MCV: 90.5 fL (ref 80.0–100.0)
Monocytes Absolute: 0.6 10*3/uL (ref 0.1–1.0)
Monocytes Relative: 7 %
Neutro Abs: 5.8 10*3/uL (ref 1.7–7.7)
Neutrophils Relative %: 70 %
Platelets: 213 10*3/uL (ref 150–400)
RBC: 3.25 MIL/uL — ABNORMAL LOW (ref 3.87–5.11)
RDW: 15 % (ref 11.5–15.5)
WBC: 8.4 10*3/uL (ref 4.0–10.5)
nRBC: 0 % (ref 0.0–0.2)

## 2023-06-18 LAB — URINALYSIS, W/ REFLEX TO CULTURE (INFECTION SUSPECTED)
Bilirubin Urine: NEGATIVE
Glucose, UA: NEGATIVE mg/dL
Hgb urine dipstick: NEGATIVE
Ketones, ur: NEGATIVE mg/dL
Nitrite: NEGATIVE
Protein, ur: NEGATIVE mg/dL
Specific Gravity, Urine: 1.017 (ref 1.005–1.030)
WBC, UA: >50 WBC/hpf (ref 0–5)
pH: 6 (ref 5.0–8.0)

## 2023-06-18 LAB — TROPONIN I (HIGH SENSITIVITY): Troponin I (High Sensitivity): 10 ng/L (ref <18)

## 2023-06-18 LAB — HEMOGLOBIN: Hemoglobin: 8.4 g/dL — ABNORMAL LOW (ref 12.0–15.0)

## 2023-06-18 LAB — CBG MONITORING, ED: Glucose-Capillary: 139 mg/dL — ABNORMAL HIGH (ref 70–99)

## 2023-06-18 MED ORDER — MOMETASONE FURO-FORMOTEROL FUM 200-5 MCG/ACT IN AERO
2.0000 | INHALATION_SPRAY | Freq: Two times a day (BID) | RESPIRATORY_TRACT | Status: DC
  Administered 2023-06-19 – 2023-06-20 (×3): 2 via RESPIRATORY_TRACT
  Filled 2023-06-18 (×2): qty 8.8

## 2023-06-18 MED ORDER — ATORVASTATIN CALCIUM 20 MG PO TABS
20.0000 mg | ORAL_TABLET | Freq: Every day | ORAL | Status: DC
  Administered 2023-06-19 – 2023-06-20 (×2): 20 mg via ORAL
  Filled 2023-06-18 (×2): qty 1

## 2023-06-18 MED ORDER — SODIUM CHLORIDE 0.9 % IV BOLUS
1000.0000 mL | Freq: Once | INTRAVENOUS | Status: AC
  Administered 2023-06-18: 1000 mL via INTRAVENOUS

## 2023-06-18 MED ORDER — HYDROXYZINE HCL 10 MG PO TABS: 10.0000 mg | ORAL_TABLET | Freq: Two times a day (BID) | ORAL | Status: DC | PRN

## 2023-06-18 MED ORDER — SODIUM CHLORIDE 0.9 % IV SOLN: 1.0000 g | INTRAVENOUS | Status: DC

## 2023-06-18 MED ORDER — TRAMADOL HCL 50 MG PO TABS: 50.0000 mg | ORAL_TABLET | Freq: Two times a day (BID) | ORAL | Status: DC | PRN

## 2023-06-18 MED ORDER — ONDANSETRON HCL 4 MG PO TABS: 4.0000 mg | ORAL_TABLET | Freq: Four times a day (QID) | ORAL | Status: DC | PRN

## 2023-06-18 MED ORDER — ONDANSETRON HCL 4 MG/2ML IJ SOLN: 4.0000 mg | Freq: Four times a day (QID) | INTRAMUSCULAR | Status: DC | PRN

## 2023-06-18 MED ORDER — INSULIN ASPART 100 UNIT/ML IJ SOLN
0.0000 [IU] | Freq: Every day | INTRAMUSCULAR | Status: DC
  Filled 2023-06-18: qty 1

## 2023-06-18 MED ORDER — INSULIN ASPART 100 UNIT/ML IJ SOLN
0.0000 [IU] | Freq: Three times a day (TID) | INTRAMUSCULAR | Status: DC
  Administered 2023-06-19: 2 [IU] via SUBCUTANEOUS
  Filled 2023-06-18: qty 1

## 2023-06-18 MED ORDER — SODIUM CHLORIDE 0.9 % IV SOLN
2.0000 g | INTRAVENOUS | Status: DC
  Administered 2023-06-18: 2 g via INTRAVENOUS
  Filled 2023-06-18: qty 20

## 2023-06-18 MED ORDER — TIOTROPIUM BROMIDE MONOHYDRATE 18 MCG IN CAPS
1.0000 | ORAL_CAPSULE | Freq: Every day | RESPIRATORY_TRACT | Status: DC
  Administered 2023-06-19 – 2023-06-20 (×2): 18 ug via RESPIRATORY_TRACT
  Filled 2023-06-18 (×2): qty 5

## 2023-06-18 MED ORDER — ACETAMINOPHEN 325 MG PO TABS: 650.0000 mg | ORAL_TABLET | Freq: Four times a day (QID) | ORAL | Status: DC | PRN

## 2023-06-18 MED ORDER — ALBUTEROL SULFATE (2.5 MG/3ML) 0.083% IN NEBU: 3.0000 mL | INHALATION_SOLUTION | RESPIRATORY_TRACT | Status: DC | PRN

## 2023-06-18 MED ORDER — ACETAMINOPHEN 650 MG RE SUPP: 650.0000 mg | Freq: Four times a day (QID) | RECTAL | Status: DC | PRN

## 2023-06-18 NOTE — Assessment & Plan Note (Addendum)
History of hematemesis 12/2022, normal EGD 01/07/2023 Chronic anticoagulation with Eliquis Hemoglobin of 9 down from 10.7 a couple months prior.  EGD was normal Parents-in-law hematochezia is, black stool no vomiting Trend hemoglobin Will hold Eliquis dose tonight, trend in hemoglobin

## 2023-06-18 NOTE — Assessment & Plan Note (Signed)
Sliding scale insulin coverage 

## 2023-06-18 NOTE — H&P (Signed)
History and Physical    Patient: Tracy Dickerson:[id number] DOB: 25-Jun-1941 DOA: 06/18/2023 DOS: the patient was seen and examined on 06/18/2023 PCP: Pcp, No  Patient coming from: Home  Chief Complaint:  Chief Complaint  Patient presents with   Weakness    HPI: Tracy Dickerson is a 82 y.o. female with medical history significant for Paroxysmal atrial fibrillation on Eliquis, COPD, hypertension, and history of breast cancer DM2, hospitalized in April 2024 with upper GI bleed for which Eliquis was transiently held with concern at the time for unsatisfactory living conditions/bedbug infestation who presents to the ED who presents with weakness and lightheadedness causing her to collapse on the ground earlier in the day though she denies blacking out or hitting her head.  She has no cough, fever chills or shortness of breath.  Denies chest pain vomiting or diarrhea.  Reported dysuria in triage.  Was noted to again have significant bedbugs on presentation and was decontaminated in the ED. ED course and data review: Mildly tachypneic in the low 20s with slightly elevated blood pressures in the 150s to 160s but otherwise normal vitals.  Labs notable for normal WBC of 8400 hemoglobin 9 which is slightly down from a couple months prior when hospitalized for UGIB when it was 10.7. Troponin negative CMP unremarkable Urinalysis with large leuks and few bacteria EKG, personally viewed and interpreted showing A-fib at 77 with incomplete LBBB Due to lethargy in the ED and CT head was done which was nonacute and showed generalized brain volume loss. Chest x-ray showed chronic lung disease with widespread fibrotic change and no sign of active infiltrate or effusion Patient was started on ceftriaxone Hospitalist consulted for admission.     Past Medical History:  Diagnosis Date   Cancer (HCC)    Breast and lung   Diabetes mellitus without complication (HCC)    GERD (gastroesophageal reflux disease)     Heart disease    Hypertension    Past Surgical History:  Procedure Laterality Date   ABDOMINAL HYSTERECTOMY     APPENDECTOMY     BREAST SURGERY     breast cancer LEFT   ESOPHAGOGASTRODUODENOSCOPY (EGD) WITH PROPOFOL N/A 01/07/2023   Procedure: ESOPHAGOGASTRODUODENOSCOPY (EGD) WITH PROPOFOL;  Surgeon: Midge Minium, MD;  Location: [facility] ENDOSCOPY;  Service: Endoscopy;  Laterality: N/A;   OPEN REDUCTION INTERNAL FIXATION (ORIF) TIBIA/FIBULA FRACTURE Left 02/03/2018   Procedure: OPEN REDUCTION INTERNAL FIXATION (ORIF) DISTAL TIBIA FRACTURE;  Surgeon: Christena Flake, MD;  Location: [facility] ORS;  Service: Orthopedics;  Laterality: Left;  ankle/lower leg   TONSILLECTOMY     Social History:  reports that she has quit smoking. She has never used smokeless tobacco. She reports that she does not currently use alcohol. She reports that she does not use drugs.  Allergies  Allergen Reactions   Oxycodone    Percocet [Oxycodone-Acetaminophen] Itching   Penicillins Rash    Has patient had a PCN reaction causing immediate rash, facial/tongue/throat swelling, SOB or lightheadedness with hypotension: Yes Has patient had a PCN reaction causing severe rash involving mucus membranes or skin necrosis: Unknown Has patient had a PCN reaction that required hospitalization: Unknown Has patient had a PCN reaction occurring within the last 10 years: No If all of the above answers are "NO", then may proceed with Cephalosporin use.    Family History  Problem Relation Age of Onset   Cancer Mother     Prior to Admission medications   Medication Sig Start Date End Date Taking?  Authorizing Provider  acetaminophen (TYLENOL) 500 MG tablet Take 500 mg by mouth every 6 (six) hours as needed.    [provider]  albuterol (PROAIR HFA) 108 (90 Base) MCG/ACT inhaler Inhale 2 puffs into the lungs every 4 (four) hours as needed for wheezing or shortness of breath.     [provider]  apixaban (ELIQUIS) 5 MG  TABS tablet Take 5 mg by mouth 2 (two) times daily. 06/17/18   [provider]  atorvastatin (LIPITOR) 20 MG tablet Take by mouth. 05/01/20   [provider]  cetirizine (ZYRTEC) 5 MG tablet Take 1 tablet (5 mg total) by mouth daily. 03/25/23 03/24/24  Pilar Jarvis, MD  fluticasone-salmeterol (ADVAIR) 250-50 MCG/ACT AEPB Inhale 1 puff into the lungs daily.    [provider]  gabapentin (NEURONTIN) 300 MG capsule Take 300 mg by mouth at bedtime. Take 2 caspules by mouth at bedtime    [provider]  hydrocortisone cream 0.5 % Apply 1 Application topically 2 (two) times daily. Apply to areas of itching from bed bug bites 03/25/23   Pilar Jarvis, MD  hydrOXYzine (ATARAX) 10 MG tablet Take 1 tablet (10 mg total) by mouth every 12 (twelve) hours as needed for itching. 02/10/23   Shaune Pollack, MD  lidocaine (LIDODERM) 5 % Place 1 patch onto the skin every 12 (twelve) hours. Remove & Discard patch within 12 hours or as directed by MD 04/05/23 04/04/24  Chesley Noon, MD  lisinopril (ZESTRIL) 10 MG tablet Take 10 mg by mouth daily. 06/28/20   [provider]  metFORMIN (GLUCOPHAGE) 1000 MG tablet Take 1,000 mg by mouth 2 (two) times daily with a meal.    [provider]  mineral oil-hydrophilic petrolatum (AQUAPHOR) ointment Apply topically as needed for dry skin. Apply to your scar on your chest 03/25/23   Pilar Jarvis, MD  ondansetron (ZOFRAN ODT) 4 MG disintegrating tablet Take 1 tablet (4 mg total) by mouth every 8 (eight) hours as needed. 06/25/21   Menshew, Charlesetta Ivory, PA-C  polyethylene glycol (MIRALAX / GLYCOLAX) 17 g packet Take 17 g by mouth daily. 08/16/20   Delfino Lovett, MD  senna-docusate (SENOKOT-S) 8.6-50 MG tablet Take 2 tablets by mouth at bedtime as needed for mild constipation. 08/15/20   Delfino Lovett, MD  SPIRIVA HANDIHALER 18 MCG inhalation capsule Place 1 capsule (18 mcg total) into inhaler and inhale daily. 02/09/22   Orvil Feil, PA-C   traMADol (ULTRAM) 50 MG tablet Take 50 mg by mouth 2 (two) times daily as needed.     [provider]    Physical Exam: Vitals:   06/18/23 1617 06/18/23 1630 06/18/23 1934  BP: (!) 146/62 (!) 151/74 (!) 164/85  Pulse: 66 73   Resp: 12 (!) 24 (!) 22  Temp: 98.8 F (37.1 C)    TempSrc: Oral    SpO2: 98% 100%    Physical Exam Vitals and nursing note reviewed.  Constitutional:      General: She is not in acute distress. HENT:     Head: Normocephalic and atraumatic.  Cardiovascular:     Rate and Rhythm: Normal rate and regular rhythm.     Heart sounds: Normal heart sounds.  Pulmonary:     Effort: Pulmonary effort is normal.     Breath sounds: Normal breath sounds.  Abdominal:     Palpations: Abdomen is soft.     Tenderness: There is no abdominal tenderness.  Neurological:     Mental Status:  Mental status is at baseline. She is lethargic.     Labs on Admission: I have personally reviewed following labs and imaging studies  CBC: Recent Labs  Lab 06/18/23 1642  WBC 8.4  NEUTROABS 5.8  HGB 9.0*  HCT 29.4*  MCV 90.5  PLT 213   Basic Metabolic Panel: Recent Labs  Lab 06/18/23 1642  NA 139  K 4.2  CL 107  CO2 26  GLUCOSE 95  BUN 22  CREATININE 0.93  CALCIUM 8.3*   GFR: CrCl cannot be calculated (Unknown ideal weight.). Liver Function Tests: Recent Labs  Lab 06/18/23 1642  AST 16  ALT 9  ALKPHOS 65  BILITOT 0.3  PROT 6.6  ALBUMIN 3.2*   No results for input(s): "LIPASE", "AMYLASE" in the last 168 hours. No results for input(s): "AMMONIA" in the last 168 hours. Coagulation Profile: No results for input(s): "INR", "PROTIME" in the last 168 hours. Cardiac Enzymes: No results for input(s): "CKTOTAL", "CKMB", "CKMBINDEX", "TROPONINI" in the last 168 hours. BNP (last 3 results) No results for input(s): "PROBNP" in the last 8760 hours. HbA1C: No results for input(s): "HGBA1C" in the last 72 hours. CBG: No results for input(s): "GLUCAP" in the  last 168 hours. Lipid Profile: No results for input(s): "CHOL", "HDL", "LDLCALC", "TRIG", "CHOLHDL", "LDLDIRECT" in the last 72 hours. Thyroid Function Tests: No results for input(s): "TSH", "T4TOTAL", "FREET4", "T3FREE", "THYROIDAB" in the last 72 hours. Anemia Panel: No results for input(s): "VITAMINB12", "FOLATE", "FERRITIN", "TIBC", "IRON", "RETICCTPCT" in the last 72 hours. Urine analysis:    Component Value Date/Time   COLORURINE YELLOW (A) 06/18/2023 1642   APPEARANCEUR HAZY (A) 06/18/2023 1642   APPEARANCEUR Hazy 01/07/2015 1812   LABSPEC 1.017 06/18/2023 1642   LABSPEC 1.014 01/07/2015 1812   PHURINE 6.0 06/18/2023 1642   GLUCOSEU NEGATIVE 06/18/2023 1642   GLUCOSEU Negative 01/07/2015 1812   HGBUR NEGATIVE 06/18/2023 1642   BILIRUBINUR NEGATIVE 06/18/2023 1642   BILIRUBINUR Negative 01/07/2015 1812   KETONESUR NEGATIVE 06/18/2023 1642   PROTEINUR NEGATIVE 06/18/2023 1642   NITRITE NEGATIVE 06/18/2023 1642   LEUKOCYTESUR LARGE (A) 06/18/2023 1642   LEUKOCYTESUR Negative 01/07/2015 1812    Radiological Exams on Admission: CT Head Wo Contrast  Result Date: 06/18/2023 CLINICAL DATA:  Mental status change of unknown cause. Confusion. Weakness. Fever. EXAM: CT HEAD WITHOUT CONTRAST TECHNIQUE: Contiguous axial images were obtained from the base of the skull through the vertex without intravenous contrast. RADIATION DOSE REDUCTION: This exam was performed according to the departmental dose-optimization program which includes automated exposure control, adjustment of the mA and/or kV according to patient size and/or use of iterative reconstruction technique. COMPARISON:  02/25/2023 FINDINGS: Brain: Generalized brain volume loss without subjective lobar predominance. No sign of acute infarction, mass lesion, hemorrhage, hydrocephalus or extra-axial collection. Vascular: There is atherosclerotic calcification of the major vessels at the base of the brain. Skull: Negative Sinuses/Orbits:  Clear/normal Other: None IMPRESSION: No acute finding by CT. Generalized brain volume loss. Atherosclerotic calcification of the major vessels at the base of the brain. Electronically Signed   By: Paulina Fusi M.D.   On: 06/18/2023 18:22   DG Chest Port 1 View  Result Date: 06/18/2023 CLINICAL DATA:  Weakness EXAM: PORTABLE CHEST 1 VIEW COMPARISON:  04/19/2023 FINDINGS: Chronic cardiomegaly. 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 Volume loss worse on the right than the left which is chronic. No sign of active infiltrate or effusion. No acute bone finding. 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 Volume  loss worse on the right than the left. No sign of active infiltrate or effusion. Electronically Signed   By: Paulina Fusi M.D.   On: 06/18/2023 18:21     Data Reviewed: Relevant notes from primary care and specialist visits, past discharge summaries as available in EHR, including Care Everywhere. Prior diagnostic testing as pertinent to current admission diagnoses Updated medications and problem lists for reconciliation ED course, including vitals, labs, imaging, treatment and response to treatment Triage notes, nursing and pharmacy notes and ED provider's notes Notable results as noted in HPI   Assessment and Plan: * Urinary tract infection History of Proteus UTI in 2022 Rocephin Follow cultures  Generalized weakness Presyncope, with fall Likely secondary to UTI as well as anemia with hemoglobin of 9 Head CT was nonacute and showed generalized brain volume loss Patient felt weak and collapsed to the ground no injury Treat UTI, workup anemia PT eval  Anemia History of hematemesis 12/2022, normal EGD 01/07/2023 Chronic anticoagulation with Eliquis Hemoglobin of 9 down from 10.7 a couple months prior.  EGD was normal Parents-in-law hematochezia is, black stool no vomiting Trend hemoglobin Will hold Eliquis dose tonight, trend in hemoglobin  PAF  (paroxysmal atrial fibrillation) (HCC) Holding Eliquis dose tonight while hemoglobin being trended Not on rate control agents  COPD (chronic obstructive pulmonary disease) (HCC) Not acutely exacerbated Continue home Advair and Spiriva with DuoNebs as needed  Type 2 diabetes mellitus with hyperlipidemia (HCC) Sliding scale insulin coverage  Unsatisfactory living conditions Recurrent bedbug infestation Patient was found to have bedbugs and was decontaminated in the ED Also had bedbugs during her hospitalization in April  HTN (hypertension) Continue lisinopril    DVT prophylaxis: SCD  Consults: none  Advance Care Planning:   Code Status: Prior   Family Communication: none  Disposition Plan: Back to previous home environment  Severity of Illness: The appropriate patient status for this patient is OBSERVATION. Observation status is judged to be reasonable and necessary in order to provide the required intensity of service to ensure the patient's safety. The patient's presenting symptoms, physical exam findings, and initial radiographic and laboratory data in the context of their medical condition is felt to place them at decreased risk for further clinical deterioration. Furthermore, it is anticipated that the patient will be medically stable for discharge from the hospital within 2 midnights of admission.   Author: Andris Baumann, MD 06/18/2023 7:45 PM  For on call review [email or web address].

## 2023-06-18 NOTE — Assessment & Plan Note (Signed)
Holding Eliquis dose tonight while hemoglobin being trended Not on rate control agents

## 2023-06-18 NOTE — Assessment & Plan Note (Signed)
Continue lisinopril

## 2023-06-18 NOTE — Assessment & Plan Note (Addendum)
Recurrent bedbug infestation Patient was found to have bedbugs and was decontaminated in the ED Also had bedbugs during her hospitalization in April

## 2023-06-18 NOTE — ED Notes (Signed)
Provided the pt with meal tray and drink as requested.

## 2023-06-18 NOTE — ED Notes (Signed)
Assumed care of pt at this time. Pt is AAXO4, on CCM, VS stable and WNL. Pt requesting bed pa, placed as requested. Call light within reach

## 2023-06-18 NOTE — Assessment & Plan Note (Addendum)
History of Proteus UTI in 2022 Rocephin Follow cultures

## 2023-06-18 NOTE — Assessment & Plan Note (Signed)
Not acutely exacerbated Continue home Advair and Spiriva with DuoNebs as needed

## 2023-06-18 NOTE — ED Provider Notes (Signed)
[facility] Provider Note    Event Date/Time   First MD Initiated Contact with Patient 06/18/23 1554     (approximate)   History   Weakness   HPI  Tracy Dickerson is a 82 year old female with history of HTN, diabetes, CKD, A-fib on Eliquis presenting to the emergency department for evaluation of generalized weakness.  Somewhat difficult to obtain history, the patient reports that since Saturday, she has not felt well with some lightheadedness.  Denies syncope, but reports she felt weak and collapsed to the ground earlier.  Denies hitting her head.  Otherwise denies trauma.  Denies fevers or chills.  Reports mild cough.  Denies chest pain, vomiting, diarrhea.  Reported dysuria in triage.  Was noted to have significant bedbugs on presentation, was decontaminated prior to my evaluation.    Physical Exam   Triage Vital Signs: ED Triage Vitals [06/18/23 1617]  Encounter Vitals Group     BP (!) 146/62     Systolic BP Percentile      Diastolic BP Percentile      Pulse Rate 66     Resp 12     Temp 98.8 F (37.1 C)     Temp Source Oral     SpO2 98 %     Weight      Height      Head Circumference      Peak Flow      Pain Score      Pain Loc      Pain Education      Exclude from Growth Chart     Most recent vital signs: Vitals:   06/18/23 2057 06/18/23 2100  BP:  (!) 150/96  Pulse:  91  Resp:  18  Temp: 99 F (37.2 C)   SpO2:  100%     General: Awake, interactive  CV:  Regular rate, good peripheral perfusion.  Resp:  Lungs clear, unlabored respirations.  Abd:  Soft, nondistended, no appreciable tenderness to palpation Neuro:  Symmetric facial movement, fluid speech, able to tell me that we are at the hospital in [place], the month is September, unable to tell me the year, inconsistent history as when asked questions about why she is here, 5 out of 5 strength in bilateral upper and lower extremities with intact sensation Skin:  Scattered  purpuric rash, previously noted   ED Results / Procedures / Treatments   Labs (all labs ordered are listed, but only abnormal results are displayed) Labs Reviewed  CBC WITH DIFFERENTIAL/PLATELET - Abnormal; Notable for the following components:      Result Value   RBC 3.25 (*)    Hemoglobin 9.0 (*)    HCT 29.4 (*)    All other components within normal limits  COMPREHENSIVE METABOLIC PANEL - Abnormal; Notable for the following components:   Calcium 8.3 (*)    Albumin 3.2 (*)    All other components within normal limits  URINALYSIS, W/ REFLEX TO CULTURE (INFECTION SUSPECTED) - Abnormal; Notable for the following components:   Color, Urine YELLOW (*)    APPearance HAZY (*)    Leukocytes,Ua LARGE (*)    Bacteria, UA FEW (*)    All other components within normal limits  HEMOGLOBIN - Abnormal; Notable for the following components:   Hemoglobin 8.4 (*)    All other components within normal limits  CBG MONITORING, ED - Abnormal; Notable for the following components:   Glucose-Capillary 139 (*)    All other components  within normal limits  URINE CULTURE  HEMOGLOBIN  HEMOGLOBIN A1C  HEMOGLOBIN  TROPONIN I (HIGH SENSITIVITY)     EKG EKG independently reviewed interpreted by myself (ER attending) demonstrates:  EKG demonstrates A-fib at a rate of 75, QRS 107, QTc 437, no acute ST changes, incomplete left bundle branch block morphology noted  RADIOLOGY Imaging independently reviewed and interpreted by myself demonstrates:  CT head without acute bleed Chest x-Kadey Mihalic without focal consolidation  PROCEDURES:  Critical Care performed: No  Procedures   MEDICATIONS ORDERED IN ED: Medications  cefTRIAXone (ROCEPHIN) 2 g in sodium chloride 0.9 % 100 mL IVPB (0 g Intravenous Stopped 06/18/23 2002)  cefTRIAXone (ROCEPHIN) 1 g in sodium chloride 0.9 % 100 mL IVPB (has no administration in time range)  traMADol (ULTRAM) tablet 50 mg (has no administration in time range)  atorvastatin  (LIPITOR) tablet 20 mg (has no administration in time range)  hydrOXYzine (ATARAX) tablet 10 mg (has no administration in time range)  albuterol (PROVENTIL) (2.5 MG/3ML) 0.083% nebulizer solution 3 mL (has no administration in time range)  mometasone-formoterol (DULERA) 200-5 MCG/ACT inhaler 2 puff (has no administration in time range)  tiotropium (SPIRIVA) inhalation capsule ([facility] use ONLY) 18 mcg (has no administration in time range)  acetaminophen (TYLENOL) tablet 650 mg (has no administration in time range)    Or  acetaminophen (TYLENOL) suppository 650 mg (has no administration in time range)  ondansetron (ZOFRAN) tablet 4 mg (has no administration in time range)    Or  ondansetron (ZOFRAN) injection 4 mg (has no administration in time range)  insulin aspart (novoLOG) injection 0-5 Units ( Subcutaneous Not Given 06/18/23 2210)  insulin aspart (novoLOG) injection 0-15 Units (has no administration in time range)  sodium chloride 0.9 % bolus 1,000 mL (0 mLs Intravenous Stopped 06/18/23 2217)     IMPRESSION / MDM / ASSESSMENT AND PLAN / ED COURSE  I reviewed the triage vital signs and the nursing notes.  Differential diagnosis includes, but is not limited to, infection including UTI, pneumonia, acute intracranial process though no focal deficits on exam, anemia, electrolyte abnormality, arrhythmia  Patient's presentation is most consistent with acute presentation with potential threat to life or bodily function.  82 year old female presenting to the emergency department for evaluation of weakness found to have waxing and waning mental status with encephalopathy on exam.  Will obtain labs, CT, x-Rembert Browe, urine to further evaluate.  Labs overall reassuring including normal white blood cell count, does have slightly worsened anemia at 9.  Urinalysis is concerning for infection with large leukocyte esterase, greater than 50 white blood cells, few bacteria.  Prior urine cultures looks like she  typically grows Proteus that is sensitive to cephalosporins.  Does have a penicillin allergy, but discussed with pharmacy team who reported that patient has tolerated cephalosporins multiple times previously.  Head CT and chest x-Treston Coker reassuring.  Patient reevaluated.  She remains hemodynamically stable.  Without evidence of sepsis, but remains encephalopathic.  Suspect this is likely related to her acute infection.  With this, will discuss with hospitalist team.  Reviewed with hospitalist team.  They will evaluate the patient for anticipated admission.      FINAL CLINICAL IMPRESSION(S) / ED DIAGNOSES   Final diagnoses:  Acute cystitis without hematuria  Generalized weakness     Rx / DC Orders   ED Discharge Orders     None        Note:  This document was prepared using Dragon voice recognition software and may  include unintentional dictation errors.   Trinna Post, MD 06/19/23 0040

## 2023-06-18 NOTE — ED Notes (Signed)
Pt requesting to ambulate to room toilet. Assisted pt to ambulate to toilet w/o incident, instructed pt to use pull cord for help when finished.

## 2023-06-18 NOTE — ED Triage Notes (Signed)
Pt brought back to room 26 after being decontaminated for bed bugs (one in a specimen cup at bedside). Pt able to answer all orientation questions. Pt making different statements as to why she is in the ED. States she is currently a cancer patient receiving tx and has been feeling weak. Endorses temperatures of "107". Pt's thoughts are disorganized in triage.

## 2023-06-18 NOTE — ED Triage Notes (Signed)
First nurse note: pt to ED [facility] from home for generalized weakness all morning. Burning with urination.

## 2023-06-18 NOTE — Assessment & Plan Note (Addendum)
Presyncope, with fall Likely secondary to UTI as well as anemia with hemoglobin of 9 Head CT was nonacute and showed generalized brain volume loss Patient felt weak and collapsed to the ground no injury Treat UTI, workup anemia PT eval

## 2023-06-19 ENCOUNTER — Observation Stay: Payer: 59

## 2023-06-19 ENCOUNTER — Encounter: Payer: Self-pay | Admitting: Internal Medicine

## 2023-06-19 ENCOUNTER — Other Ambulatory Visit: Payer: Self-pay

## 2023-06-19 DIAGNOSIS — D62 Acute posthemorrhagic anemia: Secondary | ICD-10-CM | POA: Diagnosis not present

## 2023-06-19 DIAGNOSIS — N390 Urinary tract infection, site not specified: Secondary | ICD-10-CM | POA: Diagnosis not present

## 2023-06-19 DIAGNOSIS — F0390 Unspecified dementia without behavioral disturbance: Secondary | ICD-10-CM | POA: Insufficient documentation

## 2023-06-19 LAB — IRON AND TIBC
Iron: 32 ug/dL (ref 28–170)
Saturation Ratios: 7 % — ABNORMAL LOW (ref 10.4–31.8)
TIBC: 445 ug/dL (ref 250–450)
UIBC: 413 ug/dL

## 2023-06-19 LAB — HEMOGLOBIN
Hemoglobin: 8.3 g/dL — ABNORMAL LOW (ref 12.0–15.0)
Hemoglobin: 9.3 g/dL — ABNORMAL LOW (ref 12.0–15.0)

## 2023-06-19 LAB — GLUCOSE, CAPILLARY
Glucose-Capillary: 83 mg/dL (ref 70–99)
Glucose-Capillary: 74 mg/dL (ref 70–99)
Glucose-Capillary: 125 mg/dL — ABNORMAL HIGH (ref 70–99)
Glucose-Capillary: 94 mg/dL (ref 70–99)

## 2023-06-19 LAB — VITAMIN B12: Vitamin B-12: 186 pg/mL (ref 180–914)

## 2023-06-19 LAB — FERRITIN: Ferritin: 8 ng/mL — ABNORMAL LOW (ref 11–307)

## 2023-06-19 LAB — HEMOGLOBIN A1C
Hgb A1c MFr Bld: 5.9 % — ABNORMAL HIGH (ref 4.8–5.6)
Mean Plasma Glucose: 122.63 mg/dL

## 2023-06-19 LAB — TSH: TSH: 2.763 u[IU]/mL (ref 0.350–4.500)

## 2023-06-19 MED ORDER — PANTOPRAZOLE SODIUM 40 MG IV SOLR
40.0000 mg | Freq: Two times a day (BID) | INTRAVENOUS | Status: DC
  Administered 2023-06-19 – 2023-06-20 (×3): 40 mg via INTRAVENOUS
  Filled 2023-06-19 (×3): qty 10

## 2023-06-19 MED ORDER — GABAPENTIN 100 MG PO CAPS
100.0000 mg | ORAL_CAPSULE | Freq: Every day | ORAL | Status: DC
  Administered 2023-06-19: 100 mg via ORAL
  Filled 2023-06-19: qty 1

## 2023-06-19 MED ORDER — LISINOPRIL 10 MG PO TABS
10.0000 mg | ORAL_TABLET | Freq: Every day | ORAL | Status: DC
  Administered 2023-06-19: 10 mg via ORAL
  Filled 2023-06-19: qty 1

## 2023-06-19 NOTE — Evaluation (Signed)
Physical Therapy Evaluation Patient Details Name: Tracy Dickerson MRN: [id number] DOB: 1941/04/03 Today's Date: 06/19/2023  History of Present Illness  Pt is a 82 yo female that presented to the ED for weakness and collapse.PMH of dementia, GI bleed 12/2022,  atrial fibrillation on Eliquis, COPD, hypertension, and history of breast cancer, DM2.   Clinical Impression  Pt alert and oriented to self, place, month, did display some confusion over situation due to not recalling fall prior to admission. Per pt at baseline she lives with her brother who performs most IADLs, but she is modI/I for ADLs, and ambulates with a SPC.   Bed mobility modI, sit <> stand effortful but able to complete without assist, also modI. She ambulated ~132ft with supervision. Did endorse some wooziness but reported it improved over time. No LOB noted, somewhat decreased gait speed and occasional gait path deviation.  Overall the patient appears to be near baseline level of functioning but could benefit from further skilled PT services to maximize function, safety, and independence.          If plan is discharge home, recommend the following: Assistance with cooking/housework;Assist for transportation;Direct supervision/assist for medications management   Can travel by private vehicle        Equipment Recommendations None recommended by PT  Recommendations for Other Services       Functional Status Assessment Patient has had a recent decline in their functional status and demonstrates the ability to make significant improvements in function in a reasonable and predictable amount of time.     Precautions / Restrictions Precautions Precautions: Fall Restrictions Weight Bearing Restrictions: No      Mobility  Bed Mobility Overal bed mobility: Modified Independent                  Transfers Overall transfer level: Modified independent Equipment used: Straight cane               General transfer  comment: effortful initially but able to complete without any assistance    Ambulation/Gait Ambulation/Gait assistance: Supervision Gait Distance (Feet): 150 Feet Assistive device: Straight cane         General Gait Details: pt endorsed feeling whoozy initially, stated it improved with time  Stairs            Wheelchair Mobility     Tilt Bed    Modified Rankin (Stroke Patients Only)       Balance Overall balance assessment: Needs assistance Sitting-balance support: Feet supported Sitting balance-Leahy Scale: Good       Standing balance-Leahy Scale: Good                               Pertinent Vitals/Pain Pain Assessment Pain Assessment: No/denies pain    Home Living Family/patient expects to be discharged to:: Private residence Living Arrangements: Other relatives (brother) Available Help at Discharge: Family Type of Home: House Home Access: Stairs to enter Entrance Stairs-Rails: Can reach both Entrance Stairs-Number of Steps: 2   Home Layout: One level Home Equipment: Cane - single Librarian, academic (2 wheels);Rollator (4 wheels)      Prior Function               Mobility Comments: denies any recent falls, but noted for fall prior to admission. uses SPC ADLs Comments: Pt is indep with all ADLs, however brother assist with outside chores, IADLs, including taking care of lawn  Extremity/Trunk Assessment   Upper Extremity Assessment Upper Extremity Assessment: Overall WFL for tasks assessed    Lower Extremity Assessment Lower Extremity Assessment: Overall WFL for tasks assessed    Cervical / Trunk Assessment Cervical / Trunk Assessment: Normal  Communication      Cognition Arousal: Alert Behavior During Therapy: WFL for tasks assessed/performed Overall Cognitive Status: History of cognitive impairments - at baseline                                 General Comments: pt oriented to self, place, month,  disoriented to situation (could not recall recent fall)        General Comments      Exercises     Assessment/Plan    PT Assessment Patient needs continued PT services  PT Problem List Decreased activity tolerance;Decreased mobility       PT Treatment Interventions DME instruction;Neuromuscular re-education;Gait training;Stair training;Patient/family education;Therapeutic activities;Functional mobility training;Therapeutic exercise;Balance training    PT Goals (Current goals can be found in the Care Plan section)  Acute Rehab PT Goals Patient Stated Goal: to go home PT Goal Formulation: With patient Time For Goal Achievement: 07/03/23 Potential to Achieve Goals: Good    Frequency Min 1X/week     Co-evaluation               AM-PAC PT "6 Clicks" Mobility  Outcome Measure Help needed turning from your back to your side while in a flat bed without using bedrails?: None Help needed moving from lying on your back to sitting on the side of a flat bed without using bedrails?: None Help needed moving to and from a bed to a chair (including a wheelchair)?: None Help needed standing up from a chair using your arms (e.g., wheelchair or bedside chair)?: None Help needed to walk in hospital room?: None Help needed climbing 3-5 steps with a railing? : None 6 Click Score: 24    End of Session Equipment Utilized During Treatment: Gait belt Activity Tolerance: Patient tolerated treatment well Patient left: in chair;with call bell/phone within reach;with chair alarm set Nurse Communication: Mobility status PT Visit Diagnosis: Other abnormalities of gait and mobility (R26.89);History of falling (Z91.81)    Time: 1610-9604 PT Time Calculation (min) (ACUTE ONLY): 14 min   Charges:   PT Evaluation $PT Eval Low Complexity: 1 Low PT Treatments $Therapeutic Activity: 8-22 mins PT General Charges $$ ACUTE PT VISIT: 1 Visit        Olga Coaster PT, DPT 10:13  AM,06/19/23

## 2023-06-19 NOTE — Care Management Obs Status (Signed)
MEDICARE OBSERVATION STATUS NOTIFICATION   Patient Details  Name: Tracy Dickerson MRN: [id number] Date of Birth: [birth year]   Medicare Observation Status Notification Given:  Yes    Margarito Liner, LCSW 06/19/2023, 3:02 PM

## 2023-06-19 NOTE — Consult Note (Signed)
GI Inpatient Consult Note  Reason for Consult: GI bleed/anemia    Attending Requesting Consult: Dr. Ashok Pall  History of Present Illness: LYNORE DAGHER is a 82 y.o. female seen for evaluation of anemia/GI bleed at the request of Dr. Ashok Pall. Past medical history significant for Paroxysmal atrial fibrillation on Eliquis, COPD, hypertension, and history of breast cancer, DM2. She presented to the ED for weakness/lightheadedness with some encephalopathy on exam per ED notes. Noted to have abnormal UA w/ 50+ WBCs, she was started on Recephin. Also noted to have anemia with baseline Hgb 10.7 in July and 9 on admission and dropped to 8.3 overnight.   Patient reports that she presented to the emergency room with weakness, she feels that her weakness is improved overnight and she is not currently feeling weak.  She denies any GI complaints.  She states she has a bowel movement every other day.  She denies any dark stools or black stools.  She denies any abdominal pain at home.  She reports very occasional nausea but denies any vomiting.  She feels she has a good appetite and her weight is stable. She denies any GERD or dysphagia. No epigastric pain. She denies any GI concerns.   She denies tobacco intake. She denies alcohol intake.  She does take BC's every day.  Last Colonoscopy: none prior Last Endoscopy: April 2024-- Dr. Servando Snare. Done for hematemesis. Normal EGD.   Past Medical History:  Past Medical History:  Diagnosis Date   Cancer (HCC)    Breast and lung   Diabetes mellitus without complication (HCC)    GERD (gastroesophageal reflux disease)    Heart disease    Hypertension     Problem List: Patient Active Problem List   Diagnosis Date Noted   Dementia (HCC) 06/19/2023   Generalized weakness 06/18/2023   Anemia 06/18/2023   H/O: upper GI bleed 06/18/2023   Chronic anticoagulation 06/18/2023   Infestation by bed bug 06/18/2023   CKD stage 3a, GFR 45-59 ml/min (HCC) 01/07/2023    Overweight (BMI 25.0-29.9) 01/06/2023   Type 2 diabetes mellitus with hyperlipidemia (HCC) 01/06/2023   COPD (chronic obstructive pulmonary disease) (HCC) 01/06/2023   Hypokalemia 01/06/2023   UGIB (upper gastrointestinal bleed) 01/05/2023   Unsatisfactory living conditions 01/05/2023   Constipation    UTI (urinary tract infection) 08/13/2020   HLD (hyperlipidemia) 08/13/2020   Acute renal failure superimposed on stage 3a chronic kidney disease (HCC) 08/13/2020   PAF (paroxysmal atrial fibrillation) (HCC) 08/13/2020   HTN (hypertension) 08/13/2020   Dehydration    Dehydration, moderate    Acute renal failure (HCC) 04/22/2020   Hyperkalemia    Septic shock (HCC)    Sepsis due to urinary tract infection (HCC) 03/13/2018   Acute kidney injury superimposed on chronic kidney disease (HCC) 02/13/2018   Closed extra-articular fracture of distal tibia 02/02/2018   Pneumonia 10/29/2017   Hypotension 01/26/2017   Lung nodule 05/30/2014   Sleep apnea 05/10/2014   Barrett's esophagus 05/13/2007   Malignant neoplasm of breast (female) (HCC) 06/01/1997    Past Surgical History: Past Surgical History:  Procedure Laterality Date   ABDOMINAL HYSTERECTOMY     APPENDECTOMY     BREAST SURGERY     breast cancer LEFT   ESOPHAGOGASTRODUODENOSCOPY (EGD) WITH PROPOFOL N/A 01/07/2023   Procedure: ESOPHAGOGASTRODUODENOSCOPY (EGD) WITH PROPOFOL;  Surgeon: Midge Minium, MD;  Location: [facility] ENDOSCOPY;  Service: Endoscopy;  Laterality: N/A;   OPEN REDUCTION INTERNAL FIXATION (ORIF) TIBIA/FIBULA FRACTURE Left 02/03/2018   Procedure: OPEN  REDUCTION INTERNAL FIXATION (ORIF) DISTAL TIBIA FRACTURE;  Surgeon: Christena Flake, MD;  Location: [facility] ORS;  Service: Orthopedics;  Laterality: Left;  ankle/lower leg   TONSILLECTOMY      Allergies: Allergies  Allergen Reactions   Oxycodone    Percocet [Oxycodone-Acetaminophen] Itching   Penicillins Rash    Has patient had a PCN reaction causing immediate rash,  facial/tongue/throat swelling, SOB or lightheadedness with hypotension: Yes Has patient had a PCN reaction causing severe rash involving mucus membranes or skin necrosis: Unknown Has patient had a PCN reaction that required hospitalization: Unknown Has patient had a PCN reaction occurring within the last 10 years: No If all of the above answers are "NO", then may proceed with Cephalosporin use.    Home Medications: Medications Prior to Admission  Medication Sig Dispense Refill Last Dose   acetaminophen (TYLENOL) 500 MG tablet Take 500 mg by mouth every 6 (six) hours as needed.   prn at prn   albuterol (PROAIR HFA) 108 (90 Base) MCG/ACT inhaler Inhale 2 puffs into the lungs every 4 (four) hours as needed for wheezing or shortness of breath.    prn at prn   atorvastatin (LIPITOR) 20 MG tablet Take by mouth.   06/17/2023   cetirizine (ZYRTEC) 5 MG tablet Take 1 tablet (5 mg total) by mouth daily. 30 tablet 2 06/17/2023   fluticasone-salmeterol (ADVAIR) 250-50 MCG/ACT AEPB Inhale 1 puff into the lungs daily.      gabapentin (NEURONTIN) 300 MG capsule Take 300 mg by mouth at bedtime. Take 2 caspules by mouth at bedtime   06/17/2023   lisinopril (ZESTRIL) 10 MG tablet Take 10 mg by mouth daily.   Past Week   metFORMIN (GLUCOPHAGE) 1000 MG tablet Take 1,000 mg by mouth 2 (two) times daily with a meal.   Past Week   ondansetron (ZOFRAN ODT) 4 MG disintegrating tablet Take 1 tablet (4 mg total) by mouth every 8 (eight) hours as needed. 15 tablet 0 prn at prn   SPIRIVA HANDIHALER 18 MCG inhalation capsule Place 1 capsule (18 mcg total) into inhaler and inhale daily. 30 capsule 2 06/17/2023   traMADol (ULTRAM) 50 MG tablet Take 50 mg by mouth 2 (two) times daily as needed.    06/17/2023   apixaban (ELIQUIS) 5 MG TABS tablet Take 5 mg by mouth 2 (two) times daily. (Patient not taking: Reported on 06/18/2023)   Not Taking   hydrOXYzine (ATARAX) 10 MG tablet Take 1 tablet (10 mg total) by mouth every 12 (twelve)  hours as needed for itching. (Patient not taking: Reported on 06/18/2023) 20 tablet 0 Not Taking   Home medication reconciliation was completed with the patient.   Scheduled Inpatient Medications:    atorvastatin  20 mg Oral Daily   gabapentin  100 mg Oral QHS   insulin aspart  0-15 Units Subcutaneous TID WC   insulin aspart  0-5 Units Subcutaneous QHS   lisinopril  10 mg Oral Daily   mometasone-formoterol  2 puff Inhalation BID   tiotropium  1 capsule Inhalation Daily    Continuous Inpatient Infusions:    PRN Inpatient Medications:  acetaminophen **OR** acetaminophen, albuterol, hydrOXYzine, ondansetron **OR** ondansetron (ZOFRAN) IV, traMADol  Family History: family history includes Cancer in her mother.    Social History:   reports that she has quit smoking. She has never used smokeless tobacco. She reports that she does not currently use alcohol. She reports that she does not use drugs.   Review of Systems: Constitutional:  Weight is stable.  Eyes: No changes in vision. ENT: No oral lesions, sore throat.  GI: see HPI.  Heme/Lymph: No easy bruising.  CV: No chest pain.  GU: No hematuria.  Integumentary: No rashes.  Neuro: No headaches.  Psych: No depression/anxiety.  Endocrine: No heat/cold intolerance.  Allergic/Immunologic: No urticaria.  Resp: No cough, SOB.  Musculoskeletal: No joint swelling.    Physical Examination: BP (!) 165/67 (BP Location: Left Arm)   Pulse 80   Temp 97.9 F (36.6 C)   Resp 18   SpO2 100%  Gen: NAD, alert and oriented x 4 HEENT: PEERLA, EOMI, Neck: supple, no JVD or thyromegaly Chest: CTA bilaterally, no wheezes, crackles, or other adventitious sounds CV: RRR, no m/g/c/r Abd: soft, NT, ND, +BS in all four quadrants; no HSM, guarding, ridigity, or rebound tenderness Ext: no edema, well perfused with 2+ pulses, Skin: no rash or lesions noted Lymph: no LAD  Data: Lab Results  Component Value Date   WBC 8.4 06/18/2023   HGB 8.3  (L) 06/19/2023   HCT 29.4 (L) 06/18/2023   MCV 90.5 06/18/2023   PLT 213 06/18/2023   Recent Labs  Lab 06/18/23 1642 06/18/23 2218 06/19/23 0616  HGB 9.0* 8.4* 8.3*   Lab Results  Component Value Date   NA 139 06/18/2023   K 4.2 06/18/2023   CL 107 06/18/2023   CO2 26 06/18/2023   BUN 22 06/18/2023   CREATININE 0.93 06/18/2023   Lab Results  Component Value Date   ALT 9 06/18/2023   AST 16 06/18/2023   ALKPHOS 65 06/18/2023   BILITOT 0.3 06/18/2023   No results for input(s): "APTT", "INR", "PTT" in the last 168 hours.    Assessment/Plan: Ms. Greiff is a 82 y.o. female admitted to the hospital for concern of weakness.  She was noted to have a drop in her heme and GI was consulted for evaluation of GI bleeding.   Anemia-acute on chronic, has had slow decline in Hgb since April (was 12.2 in April 2024)  with some drop noted overnight. Given daily BC's use suspect gastritis, PUD, etc. She is not on PPI at home. She denies any specific GI symptoms. Will have her start PPI (IV while admitted at PO at d/c).  Her hemoglobin dropped overnight but has remained stable without intervention, improved this morning to 9.3.  She had an EGD that was unremarkable April 2024.  As long as her hemoglobin continues to improve we will hold off on repeat EGD but could consider in future if hemoglobin drops again or develops obvious GI bleed symptoms. Ferritin low and would recommend iron supplement at d/c. Atrial Fib-she reports to me she has not been taking Eliquis over the past 2 to 3 weeks.  Given concern for PUD or gastritis and already off anticoag currently will have her continue approximate 72 hours of PPI prior to restarting the Eliquis.    Recommendations: -Start PPI BID IV -Monitor H/h -Continue other supportive care as you are with prn antiemetics, IVF, maintaining IV access and type/cross, trending Hgb, and transfusing as needed per primary team -Okay to restart Eliquis after 72 hours of  PPI  -Review at discharge importance of avoiding BCs in future  Case was discussed with Dr. Timothy Lasso. Thank you for the consult. Please call with questions or concerns.  Ardelia Mems, PA-C [facility]

## 2023-06-19 NOTE — TOC Initial Note (Signed)
Transition of Care [facility]) - Initial/Assessment Note    Patient Details  Name: Tracy Dickerson MRN: [id number] Date of Birth: 13-May-1941  Transition of Care [facility]) CM/SW Contact:    Margarito Liner, LCSW Phone Number: 06/19/2023, 3:05 PM  Clinical Narrative:   CSW met with patient. No supports at bedside. CSW introduced role and explained that PT recommendations would be discussed. Patient is agreeable to outpatient PT. She would like to review list before making decision on preference. No further concerns. CSW encouraged patient to contact CSW as needed. CSW will continue to follow patient for support and facilitate return home once stable. She is hoping her sister-in-law will transport her home at discharge. CSW encouraged her to go ahead and coordinate this.               Expected Discharge Plan: OP Rehab Barriers to Discharge: Continued Medical Work up   Patient Goals and CMS Choice            Expected Discharge Plan and Services     Post Acute Care Choice:  (Outpatient PT) Living arrangements for the past 2 months: Single Family Home                                      Prior Living Arrangements/Services Living arrangements for the past 2 months: Single Family Home Lives with:: Siblings Patient language and need for interpreter reviewed:: Yes Do you feel safe going back to the place where you live?: Yes      Need for Family Participation in Patient Care: Yes (Comment) Care giver support system in place?: Yes (comment)   Criminal Activity/Legal Involvement Pertinent to Current Situation/Hospitalization: No - Comment as needed  Activities of Daily Living   ADL Screening (condition at time of admission) Does the patient have a NEW difficulty with bathing/dressing/toileting/self-feeding that is expected to last >3 days?: No Does the patient have a NEW difficulty with getting in/out of bed, walking, or climbing stairs that is expected to last >3 days?: No Does the  patient have a NEW difficulty with communication that is expected to last >3 days?: No Is the patient deaf or have difficulty hearing?: No Does the patient have difficulty seeing, even when wearing glasses/contacts?: No Does the patient have difficulty concentrating, remembering, or making decisions?: No  Permission Sought/Granted                  Emotional Assessment Appearance:: Appears stated age Attitude/Demeanor/Rapport: Engaged, Gracious Affect (typically observed): Accepting, Appropriate, Calm, Pleasant Orientation: : Oriented to Self, Oriented to Place, Oriented to  Time, Oriented to Situation Alcohol / Substance Use: Not Applicable Psych Involvement: No (comment)  Admission diagnosis:  Urinary tract infection [N39.0] Acute cystitis without hematuria [N30.00] Generalized weakness [R53.1] Patient Active Problem List   Diagnosis Date Noted   Dementia (HCC) 06/19/2023   Generalized weakness 06/18/2023   Anemia 06/18/2023   H/O: upper GI bleed 06/18/2023   Chronic anticoagulation 06/18/2023   Infestation by bed bug 06/18/2023   CKD stage 3a, GFR 45-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/min (HCC) 01/07/2023   Overweight (BMI 25.0-29.9) 01/06/2023   Type 2 diabetes mellitus with hyperlipidemia (HCC) 01/06/2023   COPD (chronic obstructive pulmonary disease) (HCC) 01/06/2023   Hypokalemia 01/06/2023   UGIB (upper gastrointestinal bleed) 01/05/2023   Unsatisfactory living conditions 01/05/2023   Constipation    UTI (urinary tract infection) 08/13/2020   HLD (hyperlipidemia) 08/13/2020  Acute renal failure superimposed on stage 3a chronic kidney disease (HCC) 08/13/2020   PAF (paroxysmal atrial fibrillation) (HCC) 08/13/2020   HTN (hypertension) 08/13/2020   Dehydration    Dehydration, moderate    Acute renal failure (HCC) 04/22/2020   Hyperkalemia    Septic shock (HCC)    Sepsis due to urinary tract infection (HCC) 03/13/2018   Acute kidney injury superimposed on chronic kidney disease (HCC)  02/13/2018   Closed extra-articular fracture of distal tibia 02/02/2018   Pneumonia 10/29/2017   Hypotension 01/26/2017   Lung nodule 05/30/2014   Sleep apnea 05/10/2014   Barrett's esophagus 05/13/2007   Malignant neoplasm of breast (female) (HCC) 06/01/1997   PCP:  Oneita Hurt, No Pharmacy:   [facility] [address] [place], [place] - [address] [facility] [address] [place] [address] Phone: [phone] Fax: [phone]  [facility] - [place], [place] - [address] [place] [address] Phone: [phone] Fax: [phone]  [id number] [place], [place] - [address] AT [facility] [address] [place] [address] Phone: [phone] Fax: [phone]  [facility] - [place], [place] - [address] [place] [address] Phone: [phone] Fax: [phone]  [facility] #[id number] [place], [place] - [address] AT [facility] [address] [place] [address] Phone: [phone] Fax: [phone]     Social Determinants of Health (SDOH) Social History: SDOH Screenings   Food Insecurity: No Food Insecurity (06/19/2023)  Housing: Low Risk  (06/19/2023)  Transportation Needs: No Transportation Needs (06/19/2023)  Utilities: Not At Risk (06/19/2023)  Financial Resource Strain: Low Risk  (02/04/2023)   Received from [facility], [facility]  Physical Activity: Inactive (07/16/2021)   Received from [facility], [facility]  Stress: No Stress Concern Present (07/16/2021)   Received from [facility], [facility]  Tobacco Use: Medium Risk (05/05/2023)   Received from [facility]  Health Literacy: Medium Risk (07/16/2021)   Received from [facility], [facility]   SDOH Interventions:     Readmission Risk Interventions     No data to display

## 2023-06-19 NOTE — Progress Notes (Signed)
PROGRESS NOTE    Tracy Dickerson  WGN:[id number] DOB: 06-18-1941 DOA: 06/18/2023 PCP: Oneita Hurt, No    Brief Narrative:   Tracy Dickerson is a 82 y.o. female with medical history significant for Paroxysmal atrial fibrillation on Eliquis, COPD, hypertension, and history of breast cancer DM2, hospitalized in April 2024 with upper GI bleed for which Eliquis was transiently held with concern at the time for unsatisfactory living conditions/bedbug infestation who presents to the ED who presents with weakness and lightheadedness causing her to collapse on the ground earlier in the day though she denies blacking out or hitting her head.  She has no cough, fever chills or shortness of breath.  Denies chest pain vomiting or diarrhea.  Reported dysuria in triage.  Was noted to again have significant bedbugs on presentation and was decontaminated in the ED. ED course and data review: Mildly tachypneic in the low 20s with slightly elevated blood pressures in the 150s to 160s but otherwise normal vitals.  Labs notable for normal WBC of 8400 hemoglobin 9 which is slightly down from a couple months prior when hospitalized for UGIB when it was 10.7.   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 CT head nothing acute but does show significant volume loss.  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 Now hgb has drifted down to the 8s, patient unable to give an accurate history - GI consulted, will continue to hold apixaban - start ppi bid  # Fall No apparent trauma, CT head nothing acute - PT consult  # Chronic pain - will reduce gabapentin dose given dementia - home tramadol prn, will d/c if not using  # HTN Here bp elevated - resume home lisinopril  # T2DM A1c 6.9% - will monitor off metformin  # A-fib Not on a rate or rhythm control med at home - holding apixaban as above  # COPD  Quiescent - home spiriva, dulera  # Bedbugs Decontaminated in the ED   DVT prophylaxis: SCDs Code Status: full Family Communication: sister-in-law updated telephonically  Level of care: Med-Surg Status is: Observation     Consultants:  GI  Procedures: pendin  Antimicrobials:  S/p ceftriaxone    Subjective: No complaints  Objective: Vitals:   06/19/23 0500 06/19/23 0600 06/19/23 0700 06/19/23 0833  BP: (!) 115/55 (!) 126/54 (!) 169/90 (!) 165/67  Pulse: 69 70 (!) 56 80  Resp:  17   18  Temp: 98.5 F (36.9 C)   97.9 F (36.6 C)  TempSrc: Oral     SpO2: 99% 100% 100% 100%    Intake/Output Summary (Last 24 hours) at 06/19/2023 0915 Last data filed at 06/18/2023 2217 Gross per 24 hour  Intake 1100 ml  Output --  Net 1100 ml   There were no vitals filed for this visit.  Examination:  General exam: Appears calm and comfortable  Respiratory system: Clear to auscultation. Respiratory effort normal. Cardiovascular system: S1 & S2 heard, RRR. No JVD, murmurs, rubs, gallops or  clicks. No pedal edema. Gastrointestinal system: Abdomen is nondistended, soft and nontender. No organomegaly or masses felt. Normal bowel sounds heard. Central nervous system: Alert and oriented to self, moving all 4 Extremities: Symmetric 5 x 5 power. Skin: No rashes, lesions or ulcers Psychiatry: Judgement and insight appear normal. Mood & affect appropriate.     Data Reviewed: I have personally reviewed following labs and imaging studies  CBC: Recent Labs  Lab 06/18/23 1642 06/18/23 2218 06/19/23 0616  WBC 8.4  --   --   NEUTROABS 5.8  --   --   HGB 9.0* 8.4* 8.3*  HCT 29.4*  --   --   MCV 90.5  --   --   PLT 213  --   --    Basic Metabolic Panel: Recent Labs  Lab 06/18/23 1642  NA 139  K 4.2  CL 107  CO2 26  GLUCOSE 95  BUN 22  CREATININE 0.93  CALCIUM 8.3*   GFR: CrCl cannot be calculated (Unknown ideal weight.). Liver Function Tests: Recent Labs  Lab 06/18/23 1642  AST 16  ALT 9  ALKPHOS 65  BILITOT 0.3  PROT 6.6  ALBUMIN 3.2*   No results for input(s): "LIPASE", "AMYLASE" in the last 168 hours. No results for input(s): "AMMONIA" in the last 168 hours. Coagulation Profile: No results for input(s): "INR", "PROTIME" in the last 168 hours. Cardiac Enzymes: No results for input(s): "CKTOTAL", "CKMB", "CKMBINDEX", "TROPONINI" in the last 168 hours. BNP (last 3 results) No results for input(s): "PROBNP" in the last 8760 hours. HbA1C: Recent Labs    06/18/23 1642  HGBA1C 5.9*   CBG: Recent Labs  Lab 06/18/23 2208 06/19/23 0833  GLUCAP 139* 83   Lipid Profile: No results for input(s): "CHOL", "HDL", "LDLCALC", "TRIG", "CHOLHDL", "LDLDIRECT" in the last 72 hours. Thyroid Function Tests: No results for input(s): "TSH", "T4TOTAL", "FREET4", "T3FREE", "THYROIDAB" in the last 72 hours. Anemia Panel: No results for input(s): "VITAMINB12", "FOLATE", "FERRITIN", "TIBC", "IRON", "RETICCTPCT" in the last 72 hours. Urine analysis:    Component Value  Date/Time   COLORURINE YELLOW (A) 06/18/2023 1642   APPEARANCEUR HAZY (A) 06/18/2023 1642   APPEARANCEUR Hazy 01/07/2015 1812   LABSPEC 1.017 06/18/2023 1642   LABSPEC 1.014 01/07/2015 1812   PHURINE 6.0 06/18/2023 1642   GLUCOSEU NEGATIVE 06/18/2023 1642   GLUCOSEU Negative 01/07/2015 1812   HGBUR NEGATIVE 06/18/2023 1642   BILIRUBINUR NEGATIVE 06/18/2023 1642   BILIRUBINUR Negative 01/07/2015 1812   KETONESUR NEGATIVE 06/18/2023 1642   PROTEINUR NEGATIVE 06/18/2023 1642   NITRITE NEGATIVE 06/18/2023 1642   LEUKOCYTESUR LARGE (A) 06/18/2023 1642   LEUKOCYTESUR Negative 01/07/2015 1812   Sepsis Labs: @LABRCNTIP (procalcitonin:4,lacticidven:4)  )No results found for this or any previous visit (from the past 240 hour(s)).       Radiology Studies: CT Head Wo Contrast  Result Date: 06/18/2023 CLINICAL DATA:  Mental status change of  unknown cause. Confusion. Weakness. Fever. EXAM: CT HEAD WITHOUT CONTRAST TECHNIQUE: Contiguous axial images were obtained from the base of the skull through the vertex without intravenous contrast. RADIATION DOSE REDUCTION: This exam was performed according to the departmental dose-optimization program which includes automated exposure control, adjustment of the mA and/or kV according to patient size and/or use of iterative reconstruction technique. COMPARISON:  02/25/2023 FINDINGS: Brain: Generalized brain volume loss without subjective lobar predominance. No sign of acute infarction, mass lesion, hemorrhage, hydrocephalus or extra-axial collection. Vascular: There is atherosclerotic calcification of the major vessels at the base of the brain. Skull: Negative Sinuses/Orbits: Clear/normal Other: None IMPRESSION: No acute finding by CT. Generalized brain volume loss. Atherosclerotic calcification of the major vessels at the base of the brain. Electronically Signed   By: Paulina Fusi M.D.   On: 06/18/2023 18:22   DG Chest Port 1 View  Result Date:  06/18/2023 CLINICAL DATA:  Weakness EXAM: PORTABLE CHEST 1 VIEW COMPARISON:  04/19/2023 FINDINGS: Chronic cardiomegaly. 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 Volume loss worse on the right than the left which is chronic. No sign of active infiltrate or effusion. No acute bone finding. 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 Volume loss worse on the right than the left. No sign of active infiltrate or effusion. Electronically Signed   By: Paulina Fusi M.D.   On: 06/18/2023 18:21        Scheduled Meds:  atorvastatin  20 mg Oral Daily   insulin aspart  0-15 Units Subcutaneous TID WC   insulin aspart  0-5 Units Subcutaneous QHS   mometasone-formoterol  2 puff Inhalation BID   tiotropium  1 capsule Inhalation Daily   Continuous Infusions:   LOS: 0 days     Silvano Bilis, MD [facility]   If 7PM-7AM, please contact night-coverage [email or web address] Password [email or web address] 06/19/2023, 9:15 AM

## 2023-06-20 DIAGNOSIS — E538 Deficiency of other specified B group vitamins: Secondary | ICD-10-CM | POA: Insufficient documentation

## 2023-06-20 DIAGNOSIS — D62 Acute posthemorrhagic anemia: Secondary | ICD-10-CM | POA: Diagnosis not present

## 2023-06-20 DIAGNOSIS — I6529 Occlusion and stenosis of unspecified carotid artery: Secondary | ICD-10-CM | POA: Insufficient documentation

## 2023-06-20 DIAGNOSIS — D649 Anemia, unspecified: Secondary | ICD-10-CM

## 2023-06-20 LAB — GLUCOSE, CAPILLARY
Glucose-Capillary: 74 mg/dL (ref 70–99)
Glucose-Capillary: 88 mg/dL (ref 70–99)

## 2023-06-20 LAB — BASIC METABOLIC PANEL
Anion gap: 10 (ref 5–15)
BUN: 14 mg/dL (ref 8–23)
CO2: 26 mmol/L (ref 22–32)
Calcium: 8.5 mg/dL — ABNORMAL LOW (ref 8.9–10.3)
Chloride: 105 mmol/L (ref 98–111)
Creatinine, Ser: 0.88 mg/dL (ref 0.44–1.00)
GFR, Estimated: >60 mL/min (ref >60)
Glucose, Bld: 90 mg/dL (ref 70–99)
Potassium: 3.9 mmol/L (ref 3.5–5.1)
Sodium: 141 mmol/L (ref 135–145)

## 2023-06-20 LAB — CBC
HCT: 23.7 % — ABNORMAL LOW (ref 36.0–46.0)
Hemoglobin: 8.2 g/dL — ABNORMAL LOW (ref 12.0–15.0)
MCH: 31.3 pg (ref 26.0–34.0)
MCHC: 34.6 g/dL (ref 30.0–36.0)
MCV: 90.5 fL (ref 80.0–100.0)
Platelets: 206 10*3/uL (ref 150–400)
RBC: 2.62 MIL/uL — ABNORMAL LOW (ref 3.87–5.11)
RDW: 15.2 % (ref 11.5–15.5)
WBC: 8 10*3/uL (ref 4.0–10.5)
nRBC: 0 % (ref 0.0–0.2)

## 2023-06-20 LAB — RPR: RPR Ser Ql: NONREACTIVE

## 2023-06-20 MED ORDER — PANTOPRAZOLE SODIUM 40 MG PO TBEC
40.0000 mg | DELAYED_RELEASE_TABLET | Freq: Two times a day (BID) | ORAL | 1 refills | Status: DC
Start: 2023-06-20 — End: 2024-06-07

## 2023-06-20 MED ORDER — CYANOCOBALAMIN 1000 MCG/ML IJ SOLN
1000.0000 ug | Freq: Every day | INTRAMUSCULAR | Status: DC
  Filled 2023-06-20: qty 1

## 2023-06-20 MED ORDER — VITAMIN B-12 1000 MCG PO TABS: 1000.0000 ug | ORAL_TABLET | Freq: Every day | ORAL | 1 refills | Status: DC

## 2023-06-20 MED ORDER — APIXABAN 5 MG PO TABS: 5.0000 mg | ORAL_TABLET | Freq: Two times a day (BID) | ORAL | 1 refills | Status: DC

## 2023-06-20 MED ORDER — CEPHALEXIN 500 MG PO CAPS: 500.0000 mg | ORAL_CAPSULE | Freq: Two times a day (BID) | ORAL | 0 refills | Status: AC

## 2023-06-20 NOTE — Discharge Instructions (Addendum)
You can resume your apixaban on Sunday (06/22/23)  Absolutely no NSAIDs (BC powders, Goody powders, ibuprofen, naproxen, etc.)

## 2023-06-20 NOTE — TOC Transition Note (Signed)
Transition of Care [facility]) - CM/SW Discharge Note   Patient Details  Name: Tracy Dickerson MRN: [id number] Date of Birth: 10/20/1940  [facility]) CM/SW Contact:  Darolyn Rua, LCSW Phone Number: 06/20/2023, 1:50 PM   Clinical Narrative:     Patient to discharge home with self care, patient agreeable to outpatient PT at [facility], clinicals and referral faxed. Patient provided taxi voucher as her family is out of town and cannot transport. Patient is unable to find clothes/belonging, TOC [facility] clothes closet provided patient clothes and shoes for ride home, confirmed address in chart is accurate.   No further discharge needs.   Final next level of care: Home/Self Care (outpatient pt) Barriers to Discharge: No Barriers Identified   Patient Goals and CMS Choice      Discharge Placement                      Patient and family notified of of transfer: 06/20/23  Discharge Plan and Services Additional resources added to the After Visit Summary for       Post Acute Care Choice:  (Outpatient PT)                               Social Determinants of Health (SDOH) Interventions SDOH Screenings   Food Insecurity: No Food Insecurity (06/19/2023)  Housing: Low Risk  (06/19/2023)  Transportation Needs: No Transportation Needs (06/19/2023)  Utilities: Not At Risk (06/19/2023)  Financial Resource Strain: Low Risk  (02/04/2023)   Received from [facility], [facility]  Physical Activity: Inactive (07/16/2021)   Received from [facility], [facility]  Stress: No Stress Concern Present (07/16/2021)   Received from [facility], [facility]  Tobacco Use: Medium Risk (05/05/2023)   Received from [facility]  Health Literacy: Medium Risk (07/16/2021)   Received from [facility], [facility]     Readmission Risk Interventions     No data to display

## 2023-06-20 NOTE — Progress Notes (Signed)
Patient discharged to home, AVS reviewed with the patient. DON verified her brother is at home and waiting on the patient to arrive. Patient reports she came to the hospital with a pocketbook and clothing but unfortunately the items were not able to be located, DON aware and patient advocate number provided. IV removed by patient, site is clean dry and intact. Taxi provided for transportation.

## 2023-06-20 NOTE — Progress Notes (Signed)
Mobility Specialist - Progress Note     06/20/23 0854  Mobility  Activity Ambulated with assistance in hallway  Level of Assistance Standby assist, set-up cues, supervision of patient - no hands on  Assistive Device Front wheel walker  Distance Ambulated (ft) 200 ft  Range of Motion/Exercises Active  Activity Response Tolerated well  Mobility Referral Yes  $Mobility charge 1 Mobility  Mobility Specialist Start Time (ACUTE ONLY) [id number]  Mobility Specialist Stop Time (ACUTE ONLY) 0851  Mobility Specialist Time Calculation (min) (ACUTE ONLY) 19 min   Pt resting in bed on RA upon entry. Pt STS and ambulates to hallway around NS SBA with cane. Pt maintained consistent stability throughout session with cane. Pt returned to bed and left with needs in reach. Pt bed alarm activated.   Johnathan Hausen Mobility Specialist 06/20/23, 8:57 AM

## 2023-06-20 NOTE — Progress Notes (Signed)
Called and spoke with sister in law, [phone], She stated they were able to get in touch with the patient's brother [name].  He is currently at the house and we can send the patient home and he will be there to greet her.

## 2023-06-20 NOTE — Progress Notes (Signed)
Mobility Specialist - Progress Note     06/20/23 1438  Mobility  Activity Ambulated with assistance in hallway  Level of Assistance Standby assist, set-up cues, supervision of patient - no hands on  Assistive Device Cane  Distance Ambulated (ft) 30 ft  Range of Motion/Exercises Active  Activity Response Tolerated well  Mobility Referral Yes  $Mobility charge 1 Mobility  Mobility Specialist Start Time (ACUTE ONLY) 1410  Mobility Specialist Stop Time (ACUTE ONLY) 1420  Mobility Specialist Time Calculation (min) (ACUTE ONLY) 10 min   Author responding to bed alarm, Pt walking out to hallway asking about "pocketbook and clothing". Pt reminded RN is actively searching and pt returned to recliner in room. Pt left with needs in reach and chair alarm activated.   Tracy Dickerson Mobility Specialist 06/20/23, 2:42 PM

## 2023-06-20 NOTE — Discharge Summary (Signed)
Tracy Dickerson:[id number] DOB: 1941/03/07 DOA: 06/18/2023  PCP: Pcp, No  Admit date: 06/18/2023 Discharge date: 06/20/2023  Time spent: 35 minutes  Recommendations for Outpatient Follow-up:  GI f/u in about one month Establish with a pcp Cbc at f/u Consider IM b12 repletion     Discharge Diagnoses:  Principal Problem:   Anemia Active Problems:   Generalized weakness   H/O: upper GI bleed   PAF (paroxysmal atrial fibrillation) (HCC)   Chronic anticoagulation   HTN (hypertension)   Unsatisfactory living conditions   Type 2 diabetes mellitus with hyperlipidemia (HCC)   COPD (chronic obstructive pulmonary disease) (HCC)   CKD stage 3a, GFR 45-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  History of present illness:  From admission h and p Tracy Dickerson is a 82 y.o. female with medical history significant for Paroxysmal atrial fibrillation on Eliquis, COPD, hypertension, and history of breast cancer DM2, hospitalized in April 2024 with upper GI bleed for which Eliquis was transiently held with concern at the time for unsatisfactory living conditions/bedbug infestation who presents to the ED who presents with weakness and lightheadedness causing her to collapse on the ground earlier in the day though she denies blacking out or hitting her head.  She has no cough, fever chills or shortness of breath.  Denies chest pain vomiting or diarrhea.  Reported dysuria in triage.  Was noted to again have significant bedbugs on presentation and was decontaminated in the ED. ED course and data review: Mildly tachypneic in the low 20s with slightly elevated blood pressures in the 150s to 160s but otherwise normal vitals.  Labs notable for normal WBC of 8400 hemoglobin 9 which is slightly down from a couple months prior when hospitalized for UGIB  when it was 10.7.  Hospital Course:  # Dementia This seems to be the real reason she's here, sister in law says she will call the ambulance for any number of various small reasons and patient is unable to share with me why she is here. CT head nothing acute but does show significant volume loss. Rpr and tsh wnl, b12 is low  # B12 deficiency Volume loss on ct so doubt this is the primary driver for her dementia but certainly can contribute. We started IM supplementation here, unfortunately no pcp so will order oral supplement at discharge, have given family list of local pcps and patient can f/u with gi as below who can perhaps arrange for IM b12  # Acute cystitis? 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 Now hgb has drifted down to the 8s, patient unable to give an accurate history but doesn't appear having melena or hematochezia. Is taking nsaids at home. Gi has seen, defers EGD, instead advises hold nsaids, start ppi - start pantop bid  - family and patient instructed absolutely no nsaids - GI f/u in a few weeks, check cbc then   # Fall No apparent trauma, CT head nothing acute. PT  identifies no home health needs.     # HTN Here bp elevated - resumed home lisinopril   # T2DM A1c 6.9% Hasn't been taking metformin, can stop it given above A1c and b12 deficiency   # A-fib Not on a rate or rhythm control med at home - apixaban as above   # COPD  Quiescent - home spiriva, dulera   # Bedbugs Decontaminated in the ED  Procedures: none   Consultations: GI  Discharge Exam: Vitals:   06/20/23 0336 06/20/23 0818  BP: 105/87 (!) 103/55  Pulse: 77  68  Resp: 17 16  Temp:  98.2 F (36.8 C)  SpO2: 100% 100%    General: NAD Cardiovascular: RRR Respiratory: CTAB Abdomen: soft, non-tender  Discharge Instructions   Discharge Instructions     Diet - low sodium heart healthy   Complete by: As directed    Increase activity slowly   Complete by: As directed       Allergies as of 06/20/2023       Reactions   Oxycodone    Percocet [oxycodone-acetaminophen] Itching   Penicillins Rash   Has patient had a PCN reaction causing immediate rash, facial/tongue/throat swelling, SOB or lightheadedness with hypotension: Yes Has patient had a PCN reaction causing severe rash involving mucus membranes or skin necrosis: Unknown Has patient had a PCN reaction that required hospitalization: Unknown Has patient had a PCN reaction occurring within the last 10 years: No If all of the above answers are "NO", then may proceed with Cephalosporin use.        Medication List     TAKE these medications    acetaminophen 500 MG tablet Commonly known as: TYLENOL Take 500 mg by mouth every 6 (six) hours as needed.   apixaban 5 MG Tabs tablet Commonly known as: ELIQUIS Take 1 tablet (5 mg total) by mouth 2 (two) times daily.   atorvastatin 20 MG tablet Commonly known as: LIPITOR Take 20 mg by mouth daily.   cephALEXin 500 MG capsule Commonly known as: KEFLEX Take 1 capsule (500 mg total) by mouth 2 (two) times daily for 5 days.   cetirizine 5 MG tablet Commonly known as: ZYRTEC Take 1 tablet (5 mg total) by mouth daily.   cyanocobalamin 1000 MCG tablet Commonly known as: VITAMIN B12 Take 1 tablet (1,000 mcg total) by mouth daily.   fluticasone-salmeterol 250-50 MCG/ACT Aepb Commonly known as: ADVAIR Inhale 1 puff into the lungs in the morning and at bedtime.   gabapentin 300 MG capsule Commonly known as: NEURONTIN Take 300 mg by mouth at bedtime. Take 2 caspules by mouth at bedtime   lisinopril 10 MG tablet Commonly known as:  ZESTRIL Take 5 mg by mouth daily.   metFORMIN 1000 MG tablet Commonly known as: GLUCOPHAGE Take 1,000 mg by mouth 2 (two) times daily with a meal.   ondansetron 4 MG disintegrating tablet Commonly known as: Zofran ODT Take 1 tablet (4 mg total) by mouth every 8 (eight) hours as needed.   pantoprazole 40 MG tablet Commonly known as: Protonix Take 1 tablet (40 mg total) by mouth 2 (two) times daily.   ProAir HFA 108 (90 Base) MCG/ACT inhaler Generic drug: albuterol Inhale 2 puffs into the lungs every 4 (four) hours as needed for wheezing or shortness of breath.   Spiriva HandiHaler 18 MCG inhalation capsule Generic drug: tiotropium Place 1 capsule (18 mcg total) into inhaler and inhale daily.   traMADol 50 MG tablet Commonly known as: Janean Sark  Take 50 mg by mouth 2 (two) times daily as needed.   triamcinolone ointment 0.1 % Commonly known as: KENALOG Apply 1 Application topically 2 (two) times daily.       Allergies  Allergen Reactions   Oxycodone    Percocet [Oxycodone-Acetaminophen] Itching   Penicillins Rash    Has patient had a PCN reaction causing immediate rash, facial/tongue/throat swelling, SOB or lightheadedness with hypotension: Yes Has patient had a PCN reaction causing severe rash involving mucus membranes or skin necrosis: Unknown Has patient had a PCN reaction that required hospitalization: Unknown Has patient had a PCN reaction occurring within the last 10 years: No If all of the above answers are "NO", then may proceed with Cephalosporin use.    Follow-up Information     Jaynie Collins, DO Follow up.   Specialty: Gastroenterology Why: call to schedule an appointment in about 1 month Contact information: [address] [facility] [place] [address] [phone]                  The results of significant diagnostics from this hospitalization (including imaging, microbiology, ancillary and laboratory) are listed below  for reference.    Significant Diagnostic Studies: US Carotid Bilateral  Result Date: 06/19/2023 CLINICAL DATA:  Mental status change Confusion Weakness Fever Atherosclerosis of the major vessels at the base of the brain on CT scan EXAM: BILATERAL CAROTID DUPLEX ULTRASOUND TECHNIQUE: Wallace Cullens scale imaging, color Doppler and duplex ultrasound were performed of bilateral carotid and vertebral arteries in the neck. COMPARISON:  None available FINDINGS: Criteria: Quantification of carotid stenosis is based on velocity parameters that correlate the residual internal carotid diameter with NASCET-based stenosis levels, using the diameter of the distal internal carotid lumen as the denominator for stenosis measurement. The following velocity measurements were obtained: RIGHT ICA: 82/8 cm/sec CCA: 87/12 cm/sec SYSTOLIC ICA/CCA RATIO:  0.9 ECA: 135 cm/sec LEFT ICA: 129/20 cm/sec CCA: 56/9 cm/sec SYSTOLIC ICA/CCA RATIO:  2.3 ECA: 101 cm/sec RIGHT CAROTID ARTERY: No significant atheromatous plaque. RIGHT VERTEBRAL ARTERY:  Antegrade flow. LEFT CAROTID ARTERY: Moderate calcified plaque at the junction of the left common and internal carotid arteries. LEFT VERTEBRAL ARTERY:  Antegrade flow. IMPRESSION: 1. No significant stenosis of the right internal carotid artery. 2. Moderate calcified plaque of the left internal carotid artery origin with velocity parameters indicative of 50-69% stenosis. Electronically Signed   By: Acquanetta Belling M.D.   On: 06/19/2023 17:13   CT Head Wo Contrast  Result Date: 06/18/2023 CLINICAL DATA:  Mental status change of unknown cause. Confusion. Weakness. Fever. EXAM: CT HEAD WITHOUT CONTRAST TECHNIQUE: Contiguous axial images were obtained from the base of the skull through the vertex without intravenous contrast. RADIATION DOSE REDUCTION: This exam was performed according to the departmental dose-optimization program which includes automated exposure control, adjustment of the mA and/or kV  according to patient size and/or use of iterative reconstruction technique. COMPARISON:  02/25/2023 FINDINGS: Brain: Generalized brain volume loss without subjective lobar predominance. No sign of acute infarction, mass lesion, hemorrhage, hydrocephalus or extra-axial collection. Vascular: There is atherosclerotic calcification of the major vessels at the base of the brain. Skull: Negative Sinuses/Orbits: Clear/normal Other: None IMPRESSION: No acute finding by CT. Generalized brain volume loss. Atherosclerotic calcification of the major vessels at the base of the brain. Electronically Signed   By: Paulina Fusi M.D.   On: 06/18/2023 18:22   DG Chest Port 1 View  Result Date: 06/18/2023 CLINICAL DATA:  Weakness EXAM: PORTABLE CHEST 1  VIEW COMPARISON:  04/19/2023 FINDINGS: Chronic cardiomegaly. 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 Volume loss worse on the right than the left which is chronic. No sign of active infiltrate or effusion. No acute bone finding. 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 Volume loss worse on the right than the left. No sign of active infiltrate or effusion. Electronically Signed   By: Paulina Fusi M.D.   On: 06/18/2023 18:21    Microbiology: Recent Results (from the past 240 hour(s))  Urine Culture     Status: Abnormal (Preliminary result)   Collection Time: 06/18/23  4:42 PM   Specimen: Urine, Random  Result Value Ref Range Status   Specimen Description   Final    URINE, RANDOM Performed at [facility], [address], [place], [place] [address]    Special Requests   Final    NONE Reflexed from [id number] Performed at [facility], [address] [place], [place] [address]    Culture >=100,000 COLONIES/mL PROTEUS MIRABILIS (A)  Final   Report Status PENDING  Incomplete     Labs: Basic Metabolic Panel: Recent Labs  Lab 06/18/23 1642 06/20/23 0403  NA 139 141  K 4.2 3.9  CL 107 105  CO2 26 26  GLUCOSE  95 90  BUN 22 14  CREATININE 0.93 0.88  CALCIUM 8.3* 8.5*   Liver Function Tests: Recent Labs  Lab 06/18/23 1642  AST 16  ALT 9  ALKPHOS 65  BILITOT 0.3  PROT 6.6  ALBUMIN 3.2*   No results for input(s): "LIPASE", "AMYLASE" in the last 168 hours. No results for input(s): "AMMONIA" in the last 168 hours. CBC: Recent Labs  Lab 06/18/23 1642 06/18/23 2218 06/19/23 0616 06/19/23 0950 06/20/23 0403  WBC 8.4  --   --   --  8.0  NEUTROABS 5.8  --   --   --   --   HGB 9.0* 8.4* 8.3* 9.3* 8.2*  HCT 29.4*  --   --   --  23.7*  MCV 90.5  --   --   --  90.5  PLT 213  --   --   --  206   Cardiac Enzymes: No results for input(s): "CKTOTAL", "CKMB", "CKMBINDEX", "TROPONINI" in the last 168 hours. BNP: BNP (last 3 results) No results for input(s): "BNP" in the last 8760 hours.  ProBNP (last 3 results) No results for input(s): "PROBNP" in the last 8760 hours.  CBG: Recent Labs  Lab 06/19/23 1212 06/19/23 1701 06/19/23 2108 06/20/23 0817 06/20/23 1142  GLUCAP 74 125* 94 74 88       Signed:  Silvano Bilis MD.  [facility] 06/20/2023, 12:42 PM

## 2023-06-20 NOTE — Progress Notes (Signed)
     [facility] REFERRAL        Occupational Therapy * Physical Therapy * Speech Therapy                           DATE 06/20/23  PATIENT NAME  maleigha colvard PATIENT MRN [id number]       DIAGNOSIS/DIAGNOSIS CODE D64.9  DATE OF DISCHARGE: 06/20/23        Dear Provider (Name: [facility] __  Fax: [phone]   I certify that I have examined this patient and that occupational/physical/speech therapy is necessary on an outpatient basis.    The patient has expressed interest in completing their recommended course of therapy at your  location.  Once a formal order from the patient's primary care physician has been obtained, please  contact him/her to schedule an appointment for evaluation at your earliest convenience.   Arly.[email or web address] ]  Physical Therapy Evaluate and Treat  [  ]  Occupational Therapy Evaluate and Treat  [  ]  Speech Therapy Evaluate and Treat         The patient's primary care physician (listed above) must furnish and be responsible for a formal order such that the recommended services may be furnished while under the primary physician's care, and that the plan of care will be established and reviewed every 30 days (or more often if condition necessitates).

## 2023-06-21 LAB — URINE CULTURE: Culture: >=100000 — AB

## 2023-06-22 ENCOUNTER — Emergency Department
Admission: EM | Admit: 2023-06-22 | Discharge: 2023-06-22 | Discharge status: Against Medical Advice | Payer: 59 | Attending: Emergency Medicine | Admitting: Emergency Medicine

## 2023-06-22 ENCOUNTER — Other Ambulatory Visit: Payer: Self-pay

## 2023-06-22 DIAGNOSIS — Z5321 Procedure and treatment not carried out due to patient leaving prior to being seen by health care provider: Secondary | ICD-10-CM | POA: Insufficient documentation

## 2023-06-22 DIAGNOSIS — R4689 Other symptoms and signs involving appearance and behavior: Secondary | ICD-10-CM | POA: Diagnosis present

## 2023-06-22 NOTE — ED Notes (Signed)
Pt A&O x4, no obvious distress noted, respirations regular/unlabored. Pt verbalizes understanding of discharge instructions. Pt escorted off property with security.

## 2023-06-22 NOTE — ED Provider Notes (Signed)
Patient discussed with charge nurse.  She is refusing to have any sort of assessment or conversation with me.  She states that she is only here because she wants her purse return to her.  She states that she is not sick nor does she have any medical concerns that need to be addressed.  That she lied to 911 in order to get transportation back to the hospital to retrieve her purse.  In review of records it is noted that her purse is not at the hospital and she was given number for patient relations prior to her discharge.  Patient is unwilling to accept that information given to her from me today.  Charge nurse and first nurse will handle discharge.   Herschell Dimes, NP 06/22/23 1516    Minna Antis, MD 06/23/23 1253

## 2023-06-22 NOTE — ED Triage Notes (Signed)
Pt arrives with ems after requesting to have her belongings recollected. When ems advised that she needed a medical complaint for transport, she endorsed not feeling well. Pt A&O x4.

## 2023-06-22 NOTE — ED Notes (Signed)
Pt non compliant with any evaluation requests, stating "I just want my pocketbook". Security and charge notified.

## 2023-06-22 NOTE — ED Notes (Signed)
Pt yelling at this nurse, stating "I just want my pocketbook, that's the whole reason I came up here". This nurse exited the room after attempting to reassure the pt that we are currently looking for her belongings.

## 2023-07-03 ENCOUNTER — Other Ambulatory Visit: Payer: Self-pay

## 2023-07-03 ENCOUNTER — Emergency Department
Admission: EM | Admit: 2023-07-03 | Discharge: 2023-07-03 | Disposition: A | Discharge status: Home / Self Care | Payer: 59 | Attending: Emergency Medicine | Admitting: Emergency Medicine

## 2023-07-03 DIAGNOSIS — R519 Headache, unspecified: Secondary | ICD-10-CM | POA: Diagnosis not present

## 2023-07-03 DIAGNOSIS — R42 Dizziness and giddiness: Secondary | ICD-10-CM | POA: Diagnosis present

## 2023-07-03 DIAGNOSIS — H8111 Benign paroxysmal vertigo, right ear: Secondary | ICD-10-CM | POA: Diagnosis not present

## 2023-07-03 MED ORDER — MECLIZINE HCL 25 MG PO TABS
12.5000 mg | ORAL_TABLET | Freq: Once | ORAL | Status: AC
  Administered 2023-07-03: 12.5 mg via ORAL
  Filled 2023-07-03: qty 1

## 2023-07-03 MED ORDER — MECLIZINE HCL 12.5 MG PO TABS: 12.5000 mg | ORAL_TABLET | Freq: Three times a day (TID) | ORAL | 0 refills | Status: DC | PRN

## 2023-07-03 NOTE — ED Provider Notes (Signed)
[facility] Provider Note   Event Date/Time   First MD Initiated Contact with Patient 07/03/23 [phone]     (approximate) History  Headache and Dizziness  HPI Tracy Dickerson is a 82 y.o. female who presents via EMS complaining of headache and dizziness.  Patient states that while sitting watching TV last night she got the sensation of the room spinning around her.  Patient now states that whenever she moves her head quickly she can feel a sensation that the room is spinning and resolved spontaneously if she stays in one place.  Patient denies any history of vertigo.  Patient denies any persistence of the symptoms.  Patient denies any exacerbating or relieving factors other than head movement.  Patient denies any nausea/vomiting ROS: Patient currently denies any vision changes, tinnitus, difficulty speaking, facial droop, sore throat, chest pain, shortness of breath, abdominal pain, diarrhea, dysuria, or weakness/numbness/paresthesias in any extremity   Physical Exam  Triage Vital Signs: ED Triage Vitals  Encounter Vitals Group     BP 07/03/23 0917 118/75     Systolic BP Percentile --      Diastolic BP Percentile --      Pulse Rate 07/03/23 0917 75     Resp 07/03/23 0917 14     Temp 07/03/23 0917 98 F (36.7 C)     Temp Source 07/03/23 0917 Oral     SpO2 07/03/23 0917 100 %     Weight 07/03/23 0911 285 lb (129.3 kg)     Height 07/03/23 0911 5\' 6"  (1.676 m)     Head Circumference --      Peak Flow --      Pain Score 07/03/23 0911 0     Pain Loc --      Pain Education --      Exclude from Growth Chart --    Most recent vital signs: Vitals:   07/03/23 0917  BP: 118/75  Pulse: 75  Resp: 14  Temp: 98 F (36.7 C)  SpO2: 100%   General: Awake, oriented x4. CV:  Good peripheral perfusion.  Resp:  Normal effort.  Abd:  No distention.  Other:  Elderly obese Caucasian female resting comfortably in no acute distress.  Corrective horizontal unidirectional  nystagmus when eyes buried to the left. ED Results / Procedures / Treatments  Labs (all labs ordered are listed, but only abnormal results are displayed) Labs Reviewed  BASIC METABOLIC PANEL  CBC  URINALYSIS, ROUTINE W REFLEX MICROSCOPIC  CBG MONITORING, ED  PROCEDURES: Critical Care performed: No Procedures MEDICATIONS ORDERED IN ED: Medications  meclizine (ANTIVERT) tablet 12.5 mg (has no administration in time range)   IMPRESSION / MDM / ASSESSMENT AND PLAN / ED COURSE  I reviewed the triage vital signs and the nursing notes.                             The patient is on the cardiac monitor to evaluate for evidence of arrhythmia and/or significant heart rate changes. Patient's presentation is most consistent with acute presentation with potential threat to life or bodily function. Based on History, Exam, and Findings, presentation not consistent with syncope, seizure, stroke, meningitis, symptomatic anemia (gastrointestinal bleed), Increased ICP (cerebral tumor/mass), ICH. Additionally, I have a low suspicion for AOM, labyrinthitis, or other infectious process. Tx: meclizine Reassessment: Prior to discharge symptoms controlled, patient well appearing. Disposition:  Discharge. Strict return precautions discussed w/ full understanding. Advise follow  up with primary care provider within 24-48 hours.   FINAL CLINICAL IMPRESSION(S) / ED DIAGNOSES   Final diagnoses:  Benign paroxysmal positional vertigo of right ear   Rx / DC Orders   ED Discharge Orders          Ordered    Ambulatory Referral to Primary Care (Establish Care)        07/03/23 0933    meclizine (ANTIVERT) 12.5 MG tablet  3 times daily PRN        07/03/23 0933           Note:  This document was prepared using Dragon voice recognition software and may include unintentional dictation errors.   Merwyn Katos, MD 07/03/23 [phone]

## 2023-07-03 NOTE — ED Triage Notes (Addendum)
Pt to ed from home via [facility] for dizziness and headache started last night.  Pt is also constipated x 3 days. Pt states "I am on a mission. My pocket book got lost when she was here last time and I need to find it".  157/85 80HR 143 BGL   Pt is CAOx2, pt named day as Wednesday and month as September but can answer all other questions,  in no acute distress and in wheel chair in triage.   Pt has KNOWN bedbugs at home. Pt has bedbugs actively crawling all over her.

## 2023-07-08 ENCOUNTER — Encounter: Payer: Self-pay | Admitting: *Deleted

## 2023-07-08 ENCOUNTER — Emergency Department
Admission: EM | Admit: 2023-07-08 | Discharge: 2023-07-08 | Disposition: A | Discharge status: Home / Self Care | Payer: 59 | Attending: Emergency Medicine | Admitting: Emergency Medicine

## 2023-07-08 ENCOUNTER — Other Ambulatory Visit: Payer: Self-pay

## 2023-07-08 DIAGNOSIS — E119 Type 2 diabetes mellitus without complications: Secondary | ICD-10-CM | POA: Insufficient documentation

## 2023-07-08 DIAGNOSIS — B888 Other specified infestations: Secondary | ICD-10-CM

## 2023-07-08 DIAGNOSIS — N189 Chronic kidney disease, unspecified: Secondary | ICD-10-CM | POA: Diagnosis not present

## 2023-07-08 DIAGNOSIS — I129 Hypertensive chronic kidney disease with stage 1 through stage 4 chronic kidney disease, or unspecified chronic kidney disease: Secondary | ICD-10-CM | POA: Insufficient documentation

## 2023-07-08 DIAGNOSIS — B889 Infestation, unspecified: Secondary | ICD-10-CM | POA: Insufficient documentation

## 2023-07-08 DIAGNOSIS — R519 Headache, unspecified: Secondary | ICD-10-CM | POA: Diagnosis present

## 2023-07-08 LAB — COMPREHENSIVE METABOLIC PANEL
ALT: 11 U/L (ref 0–44)
AST: 18 U/L (ref 15–41)
Albumin: 3.5 g/dL (ref 3.5–5.0)
Alkaline Phosphatase: 77 U/L (ref 38–126)
Anion gap: 11 (ref 5–15)
BUN: 14 mg/dL (ref 8–23)
CO2: 23 mmol/L (ref 22–32)
Calcium: 8.7 mg/dL — ABNORMAL LOW (ref 8.9–10.3)
Chloride: 104 mmol/L (ref 98–111)
Creatinine, Ser: 1.04 mg/dL — ABNORMAL HIGH (ref 0.44–1.00)
GFR, Estimated: 55 mL/min — ABNORMAL LOW (ref >60)
Glucose, Bld: 109 mg/dL — ABNORMAL HIGH (ref 70–99)
Potassium: 4.2 mmol/L (ref 3.5–5.1)
Sodium: 138 mmol/L (ref 135–145)
Total Bilirubin: 0.5 mg/dL (ref 0.3–1.2)
Total Protein: 7 g/dL (ref 6.5–8.1)

## 2023-07-08 LAB — CBC
HCT: 26 % — ABNORMAL LOW (ref 36.0–46.0)
Hemoglobin: 9.3 g/dL — ABNORMAL LOW (ref 12.0–15.0)
MCH: 32.6 pg (ref 26.0–34.0)
MCHC: 35.8 g/dL (ref 30.0–36.0)
MCV: 91.2 fL (ref 80.0–100.0)
Platelets: 240 10*3/uL (ref 150–400)
RBC: 2.85 MIL/uL — ABNORMAL LOW (ref 3.87–5.11)
RDW: 17 % — ABNORMAL HIGH (ref 11.5–15.5)
WBC: 8 10*3/uL (ref 4.0–10.5)
nRBC: 0 % (ref 0.0–0.2)

## 2023-07-08 LAB — LIPASE, BLOOD: Lipase: 31 U/L (ref 11–51)

## 2023-07-08 NOTE — Discharge Instructions (Addendum)
Your exam and labs are normal at this time. Follow-up with your primary provider as discussed.You apparently have and infestation with bed bugs. Be sure to have the house sprayed or fumigated. You must also wash your bed linens and dry them on high heat. Any pillows or comforters too big for the dryer should be placed in a large black trash bag and placed outside in the sun for several days.

## 2023-07-08 NOTE — ED Notes (Signed)
See triage notes. Patient c/o abdominal and bilateral leg pain.

## 2023-07-08 NOTE — ED Notes (Signed)
Pt grabbed bag and ripped purse out of bag while raising voice at staff claiming that she must have her purse because it has money in it. Pt refuses to give purse back to put in plastic bag. Offered different plastic bag for pt to put cash, pt adamantly refuses and will not allow staff to take her purse. Pt was decontaminated in decon shower because of bed bugs. Explained this to pt. CN made aware.

## 2023-07-08 NOTE — ED Triage Notes (Signed)
First Nurse Note;  Pt via [facility] from home. Pt c/o L breast pain and headache since this morning. Refused VS. Pt has bed bugs. Pt sent to decon at this time. Pt is A&OX4 and NAD

## 2023-07-08 NOTE — ED Triage Notes (Signed)
Pt brought in via ems from home.  Reportedly, pt has bed bugs and went to decon shower on arrival to er.  Pt now in triage and has abd pain   no appetite. Diarrhea today  no vomiting.  No chest pain or sob.  Pt alert.

## 2023-07-08 NOTE — ED Notes (Signed)
Pt seen ripping open her bag of personal belongings which contained the bed bugs. EDT informed pt of the importance of keeping the bag closed due to the hazard of infecting others with bed bugs. Pt continued ripping open bag and pulling out the clothing to get to her pink wrist wallet. First RN with pt to redirect and de-escalate pt. Pt kept her pink wrist wallet and refuses to return it. Pts personal belonigs returned to ED staff and bagged appropriately given the hazard of bed bug infestation.

## 2023-07-08 NOTE — ED Notes (Signed)
Pts personal belongings inside a pt belonging bag that is within a labeled trash bag due to sighting of multiple bed bugs on pt upon arrival to ED.

## 2023-07-08 NOTE — ED Provider Notes (Signed)
[facility] Emergency Department Provider Note     Event Date/Time   First MD Initiated Contact with Patient 07/08/23 1806     (approximate)   History   Abdominal Pain   HPI  Tracy Dickerson is a 82 y.o. female with a history of hypertension, CKD, HTN, heart disease, diabetes, presents to the ED via EMS from home.  Patient presents with reports of of left breast pain as well as headache.  Report from EMS that the patient has a blood blood infestation.  She was taken to the [facility] shower upon arrival to the ED.  Personal belongings were bagged after intake.  At the time of this evaluation the patient is denying any acute pain to her head or to her left breast.  She apparently took an aspirin prior to calling out to 911.  She presents to the ED in no acute distress.  Patient is requesting her belongings and discharge papers at this time.  Physical Exam   Triage Vital Signs: ED Triage Vitals  Encounter Vitals Group     BP 07/08/23 1657 132/70     Systolic BP Percentile --      Diastolic BP Percentile --      Pulse Rate 07/08/23 1657 (!) 55     Resp 07/08/23 1657 20     Temp 07/08/23 1657 (!) 97.5 F (36.4 C)     Temp src --      SpO2 07/08/23 1657 100 %     Weight 07/08/23 1659 175 lb (79.4 kg)     Height 07/08/23 1659 5\' 6"  (1.676 m)     Head Circumference --      Peak Flow --      Pain Score 07/08/23 1659 10     Pain Loc --      Pain Education --      Exclude from Growth Chart --     Most recent vital signs: Vitals:   07/08/23 1657  BP: 132/70  Pulse: (!) 55  Resp: 20  Temp: (!) 97.5 F (36.4 C)  SpO2: 100%    General Awake, no distress. NAD HEENT NCAT. PERRL. EOMI. No rhinorrhea. Mucous membranes are moist.  CV:  Good peripheral perfusion. RRR RESP:  Normal effort. CTA ABD:  No distention.  NEURO: Cranial nerves II to XII grossly intact  ED Results / Procedures / Treatments   Labs (all labs ordered are listed, but only abnormal  results are displayed) Labs Reviewed  COMPREHENSIVE METABOLIC PANEL - Abnormal; Notable for the following components:      Result Value   Glucose, Bld 109 (*)    Creatinine, Ser 1.04 (*)    Calcium 8.7 (*)    GFR, Estimated 55 (*)    All other components within normal limits  CBC - Abnormal; Notable for the following components:   RBC 2.85 (*)    Hemoglobin 9.3 (*)    HCT 26.0 (*)    RDW 17.0 (*)    All other components within normal limits  LIPASE, BLOOD    EKG   RADIOLOGY  No results found.   PROCEDURES:  Critical Care performed: No  Procedures   MEDICATIONS ORDERED IN ED: Medications - No data to display   IMPRESSION / MDM / ASSESSMENT AND PLAN / ED COURSE  I reviewed the triage vital signs and the nursing notes.  Differential diagnosis includes, but is not limited to, acute appendicitis, diverticulitis, urinary tract infection/pyelonephritis, bowel obstruction, colitis, renal colic, gastroenteritis, hernia, etc.  Patient's presentation is most consistent with acute complicated illness / injury requiring diagnostic workup.  Patient's diagnosis is consistent with generalized headache resolved at the time of the evaluation.  Patient with reassuring exam at this time.  She denies any current complaints at the time of this disposition.  Patient will be discharged home with instructions to take her home medications. Patient is to follow up with her primary provider as discussed, as needed or otherwise directed. Patient is given ED precautions to return to the ED for any worsening or new symptoms.   FINAL CLINICAL IMPRESSION(S) / ED DIAGNOSES   Final diagnoses:  Acute nonintractable headache, unspecified headache type  Infestation by bed bug     Rx / DC Orders   ED Discharge Orders     None        Note:  This document was prepared using Dragon voice recognition software and may include unintentional dictation errors.     Lissa Hoard, PA-C 07/08/23 Margette Fast, MD 07/11/23 [phone]

## 2023-07-08 NOTE — ED Notes (Signed)
Lab called to send someone to obtain this pts blood after a failed attempt in triage. Pts name given to lab tech, Baxter Hire, and lab tech told this EDT they are sending someone down to triage to get this pts labs.

## 2023-08-11 ENCOUNTER — Emergency Department: Payer: 59

## 2023-08-11 ENCOUNTER — Other Ambulatory Visit: Payer: Self-pay

## 2023-08-11 ENCOUNTER — Emergency Department
Admission: EM | Admit: 2023-08-11 | Discharge: 2023-08-11 | Disposition: A | Discharge status: Home / Self Care | Payer: 59 | Attending: Emergency Medicine | Admitting: Emergency Medicine

## 2023-08-11 DIAGNOSIS — N189 Chronic kidney disease, unspecified: Secondary | ICD-10-CM | POA: Insufficient documentation

## 2023-08-11 DIAGNOSIS — N390 Urinary tract infection, site not specified: Secondary | ICD-10-CM | POA: Diagnosis not present

## 2023-08-11 DIAGNOSIS — R42 Dizziness and giddiness: Secondary | ICD-10-CM

## 2023-08-11 DIAGNOSIS — I129 Hypertensive chronic kidney disease with stage 1 through stage 4 chronic kidney disease, or unspecified chronic kidney disease: Secondary | ICD-10-CM | POA: Diagnosis not present

## 2023-08-11 DIAGNOSIS — E1122 Type 2 diabetes mellitus with diabetic chronic kidney disease: Secondary | ICD-10-CM | POA: Diagnosis not present

## 2023-08-11 DIAGNOSIS — I4891 Unspecified atrial fibrillation: Secondary | ICD-10-CM | POA: Insufficient documentation

## 2023-08-11 DIAGNOSIS — Z7901 Long term (current) use of anticoagulants: Secondary | ICD-10-CM | POA: Diagnosis not present

## 2023-08-11 DIAGNOSIS — E860 Dehydration: Secondary | ICD-10-CM | POA: Insufficient documentation

## 2023-08-11 DIAGNOSIS — R109 Unspecified abdominal pain: Secondary | ICD-10-CM | POA: Insufficient documentation

## 2023-08-11 LAB — TROPONIN I (HIGH SENSITIVITY)
Troponin I (High Sensitivity): 13 ng/L (ref <18)
Troponin I (High Sensitivity): 13 ng/L (ref <18)

## 2023-08-11 LAB — URINALYSIS, ROUTINE W REFLEX MICROSCOPIC
Bilirubin Urine: NEGATIVE
Glucose, UA: NEGATIVE mg/dL
Hgb urine dipstick: NEGATIVE
Ketones, ur: NEGATIVE mg/dL
Nitrite: NEGATIVE
Protein, ur: NEGATIVE mg/dL
Specific Gravity, Urine: 1.011 (ref 1.005–1.030)
WBC, UA: >50 WBC/hpf (ref 0–5)
pH: 6 (ref 5.0–8.0)

## 2023-08-11 LAB — COMPREHENSIVE METABOLIC PANEL
ALT: 9 U/L (ref 0–44)
AST: 16 U/L (ref 15–41)
Albumin: 3.6 g/dL (ref 3.5–5.0)
Alkaline Phosphatase: 73 U/L (ref 38–126)
Anion gap: 7 (ref 5–15)
BUN: 13 mg/dL (ref 8–23)
CO2: 26 mmol/L (ref 22–32)
Calcium: 8.9 mg/dL (ref 8.9–10.3)
Chloride: 107 mmol/L (ref 98–111)
Creatinine, Ser: 1.04 mg/dL — ABNORMAL HIGH (ref 0.44–1.00)
GFR, Estimated: 54 mL/min — ABNORMAL LOW (ref >60)
Glucose, Bld: 130 mg/dL — ABNORMAL HIGH (ref 70–99)
Potassium: 3.6 mmol/L (ref 3.5–5.1)
Sodium: 140 mmol/L (ref 135–145)
Total Bilirubin: 0.2 mg/dL (ref <1.2)
Total Protein: 7.1 g/dL (ref 6.5–8.1)

## 2023-08-11 LAB — CBC WITH DIFFERENTIAL/PLATELET
Abs Immature Granulocytes: 0.03 10*3/uL (ref 0.00–0.07)
Basophils Absolute: 0.1 10*3/uL (ref 0.0–0.1)
Basophils Relative: 1 %
Eosinophils Absolute: 0.4 10*3/uL (ref 0.0–0.5)
Eosinophils Relative: 5 %
HCT: 23.6 % — ABNORMAL LOW (ref 36.0–46.0)
Hemoglobin: 8.1 g/dL — ABNORMAL LOW (ref 12.0–15.0)
Immature Granulocytes: 0 %
Lymphocytes Relative: 22 %
Lymphs Abs: 1.5 10*3/uL (ref 0.7–4.0)
MCH: 31 pg (ref 26.0–34.0)
MCHC: 34.3 g/dL (ref 30.0–36.0)
MCV: 90.4 fL (ref 80.0–100.0)
Monocytes Absolute: 0.6 10*3/uL (ref 0.1–1.0)
Monocytes Relative: 8 %
Neutro Abs: 4.5 10*3/uL (ref 1.7–7.7)
Neutrophils Relative %: 64 %
Platelets: 222 10*3/uL (ref 150–400)
RBC: 2.61 MIL/uL — ABNORMAL LOW (ref 3.87–5.11)
RDW: 17.3 % — ABNORMAL HIGH (ref 11.5–15.5)
WBC: 7 10*3/uL (ref 4.0–10.5)
nRBC: 0 % (ref 0.0–0.2)

## 2023-08-11 LAB — LIPASE, BLOOD: Lipase: 31 U/L (ref 11–51)

## 2023-08-11 MED ORDER — SODIUM CHLORIDE 0.9 % IV SOLN
2.0000 g | Freq: Once | INTRAVENOUS | Status: AC
  Administered 2023-08-11: 2 g via INTRAVENOUS
  Filled 2023-08-11: qty 20

## 2023-08-11 MED ORDER — SODIUM CHLORIDE 0.9 % IV BOLUS
500.0000 mL | Freq: Once | INTRAVENOUS | Status: AC
  Administered 2023-08-11: 500 mL via INTRAVENOUS

## 2023-08-11 MED ORDER — MECLIZINE HCL 25 MG PO TABS
12.5000 mg | ORAL_TABLET | Freq: Once | ORAL | Status: AC
  Administered 2023-08-11: 12.5 mg via ORAL
  Filled 2023-08-11: qty 1

## 2023-08-11 MED ORDER — IOHEXOL 350 MG/ML SOLN
100.0000 mL | Freq: Once | INTRAVENOUS | Status: AC | PRN
  Administered 2023-08-11: 100 mL via INTRAVENOUS

## 2023-08-11 MED ORDER — CEPHALEXIN 500 MG PO CAPS: 500.0000 mg | ORAL_CAPSULE | Freq: Three times a day (TID) | ORAL | 0 refills | Status: DC

## 2023-08-11 MED ORDER — CEPHALEXIN 500 MG PO CAPS: 500.0000 mg | ORAL_CAPSULE | Freq: Three times a day (TID) | ORAL | 0 refills | Status: AC

## 2023-08-11 NOTE — ED Notes (Signed)
Called brother Hurshel Party, to notify him that pt needs a ride home from the ER. Brother states "I will come get her"

## 2023-08-11 NOTE — ED Notes (Signed)
Pt up to restroom, ambulated with no difficulty, respirations even and unlabored. NADN

## 2023-08-11 NOTE — ED Notes (Signed)
Notified by another RN that pt had removed all cardiac leads as well as IV after provider told pt she was being discharged. Pt in nad.

## 2023-08-11 NOTE — ED Notes (Signed)
Pt gone to MRI 

## 2023-08-11 NOTE — Discharge Instructions (Addendum)
You were most likely dehydrated and have a UTI which was probably causing your dizziness.  You were negative for any type of stroke.  Return to the ER if develop worsening symptoms, falls or any other concerns  IMPRESSION: 5 mm nonobstructing right renal stone.   Diverticulosis without diverticulitis.   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 Recommend surveillance ultrasound in 3 years.

## 2023-08-11 NOTE — ED Triage Notes (Signed)
Pt to Ed via [facility] dizziness with elevated BP 174/60

## 2023-08-11 NOTE — ED Provider Notes (Signed)
[facility] Provider Note    Event Date/Time   First MD Initiated Contact with Patient 08/11/23 1531     (approximate)   History   Dizziness   HPI  Tracy Dickerson is a 82 y.o. female who comes in with dizziness.  Patient reports that she has a history of hypertension, CKD, hypertension heart disease, diabetes, A-fib on Eliquis.  She reports that she went to bed yesterday around 8 PM felt her normal self.  She states that she woke up this morning and it felt some dizziness.  She reports the dizziness is worse with standing but still feels it at rest.  She denies ever having this previously.  Denies any chest pain, shortness of breath.  Does report some abdominal pain and dysuria.  She does think that she might of had a fall but that was a few weeks ago.  Patient does report that she does have bedbugs at the place that she is currently living at.     Physical Exam   Triage Vital Signs: ED Triage Vitals  Encounter Vitals Group     BP --      Systolic BP Percentile --      Diastolic BP Percentile --      Pulse --      Resp --      Temp --      Temp src --      SpO2 --      Weight 08/11/23 1528 160 lb (72.6 kg)     Height 08/11/23 1528 5\' 6"  (1.676 m)     Head Circumference --      Peak Flow --      Pain Score 08/11/23 1527 10     Pain Loc --      Pain Education --      Exclude from Growth Chart --     Most recent vital signs: Vitals:   08/11/23 1550  BP: (!) 141/70  Pulse: 91  Resp: 18  Temp: 97.8 F (36.6 C)  SpO2: 100%     General: Awake, no distress.  CV:  Good peripheral perfusion.  Resp:  Normal effort.  Abd:  No distention.  Tender in her abdomen. Other:  Cranial nerves appear intact.  Finger-nose appear intact   ED Results / Procedures / Treatments   Labs (all labs ordered are listed, but only abnormal results are displayed) Labs Reviewed  CBC WITH DIFFERENTIAL/PLATELET  COMPREHENSIVE METABOLIC PANEL  LIPASE, BLOOD   URINALYSIS, ROUTINE W REFLEX MICROSCOPIC  TROPONIN I (HIGH SENSITIVITY)     EKG  My interpretation of EKG:  A-fib sinus rhythm 94 without any ST elevation or T wave inversions, PVC, normal intervals  RADIOLOGY I have reviewed the CT head personally interpreted PROCEDURES:  Critical Care performed: No  .1-3 Lead EKG Interpretation  Performed by: Concha Se, MD Authorized by: Concha Se, MD     Interpretation: abnormal     ECG rate:  90   ECG rate assessment: normal     Rhythm: atrial fibrillation     Ectopy: none     Conduction: normal      MEDICATIONS ORDERED IN ED: Medications  sodium chloride 0.9 % bolus 500 mL (has no administration in time range)  meclizine (ANTIVERT) tablet 12.5 mg (has no administration in time range)     IMPRESSION / MDM / ASSESSMENT AND PLAN / ED COURSE  I reviewed the triage vital signs and the nursing  notes.   Patient's presentation is most consistent with acute presentation with potential threat to life or bodily function.   Patient comes in with dizziness.  To note the dizziness has been ongoing since 8 AM therefore stroke code was not called unlikely to be LVO based upon examination but will get CTA to evaluate for any type of dissection, thrombus, LVO.  Stroke code was not called given NIH stroke scale of 0.    Will consider other etiologies of dizziness including UTI, abdominal pathology, orthostatics.  Orthostatics were positive.  MRI is negative  IMPRESSION: 5 mm nonobstructing right renal stone.   Diverticulosis without diverticulitis.   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 Recommend surveillance ultrasound in 3 years.  IMPRESSION: 1. No acute intracranial process. 2. No intracranial large vessel occlusion or significant stenosis. 3. No hemodynamically significant stenosis in the neck. 4. Aortic atherosclerosis.    Reevaluated patient she reports feeling much better after fluids and antibiotics.  I  suspect that this was related to UTI and dehydration.  She is been ambulatory around the room multiple times without any issues.  She feels comfortable with discharge home and understands to start taking antibiotics tomorrow given I gave her a dose of ceftriaxone tonight.  She is not septic appearing.  She feels comfortable with this plan.  Incidental findings were discussed with patient she will follow-up with these outpatient.    The patient is on the cardiac monitor to evaluate for evidence of arrhythmia and/or significant heart rate changes.      FINAL CLINICAL IMPRESSION(S) / ED DIAGNOSES   Final diagnoses:  Dehydration  Dizziness  Urinary tract infection without hematuria, site unspecified     Rx / DC Orders   ED Discharge Orders          Ordered    cephALEXin (KEFLEX) 500 MG capsule  3 times daily        08/11/23 2244             Note:  This document was prepared using Dragon voice recognition software and may include unintentional dictation errors.   Concha Se, MD 08/11/23 [phone]

## 2023-08-11 NOTE — ED Notes (Signed)
Pt is at CT.

## 2023-08-11 NOTE — ED Notes (Signed)
Pt left with brother, nadn

## 2023-08-14 LAB — URINE CULTURE: Culture: >=100000 — AB

## 2023-08-16 NOTE — Progress Notes (Signed)
ED Antimicrobial Stewardship Positive Culture Follow Up   Tracy Dickerson is an 82 y.o. female who presented to [facility] on 08/11/2023 with a chief complaint of  Chief Complaint  Patient presents with   Dizziness   Recent Results (from the past 720 hour(s))  Urine Culture     Status: Abnormal   Collection Time: 08/11/23  4:54 PM   Specimen: Urine, Random  Result Value Ref Range Status   Specimen Description   Final    URINE, RANDOM Performed at [facility], [address], [place], [place] [address]    Special Requests   Final    NONE Performed at [facility], [address] [place], [place] [address]    Culture >=100,000 COLONIES/mL ENTEROCOCCUS FAECALIS (A)  Final   Report Status 08/14/2023 FINAL  Final   Organism ID, Bacteria ENTEROCOCCUS FAECALIS (A)  Final      Susceptibility   Enterococcus faecalis - MIC*    AMPICILLIN 8 SENSITIVE Sensitive     NITROFURANTOIN <=16 SENSITIVE Sensitive     VANCOMYCIN 1 SENSITIVE Sensitive     * >=100,000 COLONIES/mL ENTEROCOCCUS FAECALIS   [x]  Treated with Keflex, organism resistant to prescribed antimicrobial []  Patient discharged originally without antimicrobial agent and treatment is now indicated  New antibiotic prescription: Macrobid 100 mg BID x 5 days   ED Provider: Dr. Marisa Severin   Comments: Have attempted to call patient twice but have not been able to reach. Will make one more attempt to contact patient.   Littie Deeds, PharmD Pharmacy Resident  08/16/2023 2:40 PM

## 2023-08-20 ENCOUNTER — Emergency Department: Payer: 59

## 2023-08-20 ENCOUNTER — Observation Stay: Payer: 59

## 2023-08-20 ENCOUNTER — Inpatient Hospital Stay
Admission: EM | Admit: 2023-08-20 | Discharge: 2023-09-03 | DRG: 312 | Disposition: A | Discharge status: Skilled Nursing Facility | Payer: 59 | Attending: Internal Medicine | Admitting: Internal Medicine

## 2023-08-20 ENCOUNTER — Other Ambulatory Visit: Payer: Self-pay

## 2023-08-20 DIAGNOSIS — E1129 Type 2 diabetes mellitus with other diabetic kidney complication: Secondary | ICD-10-CM | POA: Diagnosis present

## 2023-08-20 DIAGNOSIS — E876 Hypokalemia: Secondary | ICD-10-CM | POA: Diagnosis not present

## 2023-08-20 DIAGNOSIS — R55 Syncope and collapse: Secondary | ICD-10-CM | POA: Diagnosis present

## 2023-08-20 DIAGNOSIS — Z87891 Personal history of nicotine dependence: Secondary | ICD-10-CM

## 2023-08-20 DIAGNOSIS — I129 Hypertensive chronic kidney disease with stage 1 through stage 4 chronic kidney disease, or unspecified chronic kidney disease: Secondary | ICD-10-CM | POA: Diagnosis present

## 2023-08-20 DIAGNOSIS — Z1152 Encounter for screening for COVID-19: Secondary | ICD-10-CM

## 2023-08-20 DIAGNOSIS — K219 Gastro-esophageal reflux disease without esophagitis: Secondary | ICD-10-CM | POA: Diagnosis present

## 2023-08-20 DIAGNOSIS — R42 Dizziness and giddiness: Secondary | ICD-10-CM

## 2023-08-20 DIAGNOSIS — E1122 Type 2 diabetes mellitus with diabetic chronic kidney disease: Secondary | ICD-10-CM | POA: Diagnosis present

## 2023-08-20 DIAGNOSIS — I480 Paroxysmal atrial fibrillation: Secondary | ICD-10-CM | POA: Diagnosis present

## 2023-08-20 DIAGNOSIS — Z853 Personal history of malignant neoplasm of breast: Secondary | ICD-10-CM

## 2023-08-20 DIAGNOSIS — E538 Deficiency of other specified B group vitamins: Secondary | ICD-10-CM | POA: Diagnosis present

## 2023-08-20 DIAGNOSIS — Z6833 Body mass index (BMI) 33.0-33.9, adult: Secondary | ICD-10-CM

## 2023-08-20 DIAGNOSIS — J449 Chronic obstructive pulmonary disease, unspecified: Secondary | ICD-10-CM | POA: Diagnosis not present

## 2023-08-20 DIAGNOSIS — Z7951 Long term (current) use of inhaled steroids: Secondary | ICD-10-CM

## 2023-08-20 DIAGNOSIS — Z23 Encounter for immunization: Secondary | ICD-10-CM

## 2023-08-20 DIAGNOSIS — R531 Weakness: Secondary | ICD-10-CM

## 2023-08-20 DIAGNOSIS — I951 Orthostatic hypotension: Secondary | ICD-10-CM | POA: Diagnosis not present

## 2023-08-20 DIAGNOSIS — Z809 Family history of malignant neoplasm, unspecified: Secondary | ICD-10-CM

## 2023-08-20 DIAGNOSIS — E8809 Other disorders of plasma-protein metabolism, not elsewhere classified: Secondary | ICD-10-CM | POA: Diagnosis present

## 2023-08-20 DIAGNOSIS — E785 Hyperlipidemia, unspecified: Secondary | ICD-10-CM | POA: Diagnosis present

## 2023-08-20 DIAGNOSIS — Z885 Allergy status to narcotic agent status: Secondary | ICD-10-CM

## 2023-08-20 DIAGNOSIS — E669 Obesity, unspecified: Secondary | ICD-10-CM | POA: Diagnosis present

## 2023-08-20 DIAGNOSIS — E559 Vitamin D deficiency, unspecified: Secondary | ICD-10-CM | POA: Insufficient documentation

## 2023-08-20 DIAGNOSIS — J189 Pneumonia, unspecified organism: Principal | ICD-10-CM | POA: Diagnosis present

## 2023-08-20 DIAGNOSIS — D62 Acute posthemorrhagic anemia: Secondary | ICD-10-CM | POA: Diagnosis present

## 2023-08-20 DIAGNOSIS — W19XXXA Unspecified fall, initial encounter: Secondary | ICD-10-CM

## 2023-08-20 DIAGNOSIS — Z90710 Acquired absence of both cervix and uterus: Secondary | ICD-10-CM

## 2023-08-20 DIAGNOSIS — D649 Anemia, unspecified: Secondary | ICD-10-CM | POA: Diagnosis present

## 2023-08-20 DIAGNOSIS — I159 Secondary hypertension, unspecified: Secondary | ICD-10-CM | POA: Diagnosis not present

## 2023-08-20 DIAGNOSIS — Z79899 Other long term (current) drug therapy: Secondary | ICD-10-CM

## 2023-08-20 DIAGNOSIS — Z880 Allergy status to penicillin: Secondary | ICD-10-CM

## 2023-08-20 DIAGNOSIS — I10 Essential (primary) hypertension: Secondary | ICD-10-CM | POA: Diagnosis present

## 2023-08-20 DIAGNOSIS — R7989 Other specified abnormal findings of blood chemistry: Secondary | ICD-10-CM

## 2023-08-20 DIAGNOSIS — D509 Iron deficiency anemia, unspecified: Secondary | ICD-10-CM

## 2023-08-20 DIAGNOSIS — Z87440 Personal history of urinary (tract) infections: Secondary | ICD-10-CM

## 2023-08-20 DIAGNOSIS — J440 Chronic obstructive pulmonary disease with acute lower respiratory infection: Secondary | ICD-10-CM | POA: Diagnosis present

## 2023-08-20 DIAGNOSIS — Y92009 Unspecified place in unspecified non-institutional (private) residence as the place of occurrence of the external cause: Secondary | ICD-10-CM

## 2023-08-20 DIAGNOSIS — Z85118 Personal history of other malignant neoplasm of bronchus and lung: Secondary | ICD-10-CM

## 2023-08-20 DIAGNOSIS — N1831 Chronic kidney disease, stage 3a: Secondary | ICD-10-CM | POA: Diagnosis present

## 2023-08-20 DIAGNOSIS — Z7901 Long term (current) use of anticoagulants: Secondary | ICD-10-CM

## 2023-08-20 DIAGNOSIS — D500 Iron deficiency anemia secondary to blood loss (chronic): Secondary | ICD-10-CM

## 2023-08-20 DIAGNOSIS — B888 Other specified infestations: Secondary | ICD-10-CM | POA: Diagnosis present

## 2023-08-20 DIAGNOSIS — R519 Headache, unspecified: Secondary | ICD-10-CM | POA: Insufficient documentation

## 2023-08-20 LAB — CBC
HCT: 26.3 % — ABNORMAL LOW (ref 36.0–46.0)
Hemoglobin: 8.1 g/dL — ABNORMAL LOW (ref 12.0–15.0)
MCH: 26.5 pg (ref 26.0–34.0)
MCHC: 30.8 g/dL (ref 30.0–36.0)
MCV: 85.9 fL (ref 80.0–100.0)
Platelets: 254 10*3/uL (ref 150–400)
RBC: 3.06 MIL/uL — ABNORMAL LOW (ref 3.87–5.11)
RDW: 18.6 % — ABNORMAL HIGH (ref 11.5–15.5)
WBC: 7.7 10*3/uL (ref 4.0–10.5)
nRBC: 0 % (ref 0.0–0.2)

## 2023-08-20 LAB — URINALYSIS, ROUTINE W REFLEX MICROSCOPIC
Bilirubin Urine: NEGATIVE
Glucose, UA: NEGATIVE mg/dL
Hgb urine dipstick: NEGATIVE
Ketones, ur: NEGATIVE mg/dL
Leukocytes,Ua: NEGATIVE
Nitrite: NEGATIVE
Protein, ur: NEGATIVE mg/dL
Specific Gravity, Urine: 1.014 (ref 1.005–1.030)
pH: 6 (ref 5.0–8.0)

## 2023-08-20 LAB — PROCALCITONIN: Procalcitonin: <0.1 ng/mL

## 2023-08-20 LAB — TROPONIN I (HIGH SENSITIVITY): Troponin I (High Sensitivity): 12 ng/L (ref <18)

## 2023-08-20 LAB — SARS CORONAVIRUS 2 BY RT PCR: SARS Coronavirus 2 by RT PCR: NEGATIVE

## 2023-08-20 LAB — PHOSPHORUS: Phosphorus: 2.7 mg/dL (ref 2.5–4.6)

## 2023-08-20 LAB — BASIC METABOLIC PANEL
Anion gap: 9 (ref 5–15)
BUN: 11 mg/dL (ref 8–23)
CO2: 24 mmol/L (ref 22–32)
Calcium: 8.6 mg/dL — ABNORMAL LOW (ref 8.9–10.3)
Chloride: 107 mmol/L (ref 98–111)
Creatinine, Ser: 1.03 mg/dL — ABNORMAL HIGH (ref 0.44–1.00)
GFR, Estimated: 55 mL/min — ABNORMAL LOW (ref >60)
Glucose, Bld: 133 mg/dL — ABNORMAL HIGH (ref 70–99)
Potassium: 3.1 mmol/L — ABNORMAL LOW (ref 3.5–5.1)
Sodium: 140 mmol/L (ref 135–145)

## 2023-08-20 LAB — BRAIN NATRIURETIC PEPTIDE: B Natriuretic Peptide: 233.9 pg/mL — ABNORMAL HIGH (ref 0.0–100.0)

## 2023-08-20 LAB — LACTIC ACID, PLASMA: Lactic Acid, Venous: 1.1 mmol/L (ref 0.5–1.9)

## 2023-08-20 LAB — D-DIMER, QUANTITATIVE: D-Dimer, Quant: 1.85 ug{FEU}/mL — ABNORMAL HIGH (ref 0.00–0.50)

## 2023-08-20 LAB — MAGNESIUM: Magnesium: 2.3 mg/dL (ref 1.7–2.4)

## 2023-08-20 MED ORDER — SODIUM CHLORIDE 0.9 % IV BOLUS
1000.0000 mL | Freq: Once | INTRAVENOUS | Status: AC
  Administered 2023-08-20: 1000 mL via INTRAVENOUS

## 2023-08-20 MED ORDER — SODIUM CHLORIDE 0.9 % IV SOLN
500.0000 mg | Freq: Once | INTRAVENOUS | Status: AC
  Administered 2023-08-20: 500 mg via INTRAVENOUS
  Filled 2023-08-20: qty 5

## 2023-08-20 MED ORDER — IPRATROPIUM-ALBUTEROL 0.5-2.5 (3) MG/3ML IN SOLN
3.0000 mL | Freq: Four times a day (QID) | RESPIRATORY_TRACT | Status: DC
  Administered 2023-08-20 – 2023-08-21 (×4): 3 mL via RESPIRATORY_TRACT
  Filled 2023-08-20 (×4): qty 3

## 2023-08-20 MED ORDER — IPRATROPIUM-ALBUTEROL 0.5-2.5 (3) MG/3ML IN SOLN: 3.0000 mL | Freq: Once | RESPIRATORY_TRACT | Status: DC

## 2023-08-20 MED ORDER — ALBUTEROL SULFATE (2.5 MG/3ML) 0.083% IN NEBU: 2.5000 mg | INHALATION_SOLUTION | RESPIRATORY_TRACT | Status: DC | PRN

## 2023-08-20 MED ORDER — POTASSIUM CHLORIDE CRYS ER 20 MEQ PO TBCR
40.0000 meq | EXTENDED_RELEASE_TABLET | Freq: Once | ORAL | Status: AC
  Administered 2023-08-20: 40 meq via ORAL
  Filled 2023-08-20: qty 2

## 2023-08-20 MED ORDER — DM-GUAIFENESIN ER 30-600 MG PO TB12: 1.0000 | ORAL_TABLET | Freq: Two times a day (BID) | ORAL | Status: DC | PRN

## 2023-08-20 MED ORDER — IOHEXOL 350 MG/ML SOLN
75.0000 mL | Freq: Once | INTRAVENOUS | Status: AC | PRN
  Administered 2023-08-20: 75 mL via INTRAVENOUS

## 2023-08-20 MED ORDER — HYDROXYZINE HCL 10 MG PO TABS: 10.0000 mg | ORAL_TABLET | Freq: Three times a day (TID) | ORAL | Status: DC | PRN

## 2023-08-20 MED ORDER — SODIUM CHLORIDE 0.9 % IV SOLN
2.0000 g | Freq: Once | INTRAVENOUS | Status: AC
  Administered 2023-08-20: 2 g via INTRAVENOUS
  Filled 2023-08-20: qty 20

## 2023-08-20 NOTE — ED Notes (Signed)
Patients sheet and blanket she was laying on before decan was placed in trash bag, tied up in room

## 2023-08-20 NOTE — ED Notes (Signed)
Patient stripped from all her clothing, after finding bed bugs, and placed in hospital gown and new socks

## 2023-08-20 NOTE — ED Provider Notes (Signed)
[facility] Provider Note    Event Date/Time   First MD Initiated Contact with Patient 08/20/23 1537     (approximate)   History   Near Syncope   HPI  Tracy Dickerson is a 82 y.o. female with past medical history of chronic kidney disease, recurrent UTIs, paroxysmal A-fib, diabetes, COPD, here with generalized weakness and fall.  The patient states that over the last 2 weeks, she has had progressive worsening generalized weakness.  She has had difficulty getting around the house.  She has had a mild increase in her cough.  She also had increased urinary frequency and urgency.  Reports that earlier today, her legs just would not support her so she fell to the ground in her living room.  Does not believe she hit her head hard.  She subsequent presents for evaluation.  She has had decreased appetite.  No current pain.     Physical Exam   Triage Vital Signs: ED Triage Vitals  Encounter Vitals Group     BP 08/20/23 1319 (!) 110/59     Systolic BP Percentile --      Diastolic BP Percentile --      Pulse Rate 08/20/23 1319 68     Resp 08/20/23 1318 20     Temp 08/20/23 1319 98 F (36.7 C)     Temp src --      SpO2 08/20/23 1319 95 %     Weight 08/20/23 1319 200 lb (90.7 kg)     Height 08/20/23 1319 5\' 6"  (1.676 m)     Head Circumference --      Peak Flow --      Pain Score 08/20/23 1319 5     Pain Loc --      Pain Education --      Exclude from Growth Chart --     Most recent vital signs: Vitals:   08/20/23 1319 08/20/23 1930  BP: (!) 110/59 (!) 159/96  Pulse: 68 91  Resp:    Temp: 98 F (36.7 C)   SpO2: 95% 98%     General: Awake, no distress.  CV:  Good peripheral perfusion.  Regular rate and rhythm.  No murmurs. Resp:  Normal work of breathing.  Lungs with right basilar Rales and slight wheezes. Abd:  No distention.  Minimal suprapubic tenderness.  No guarding or rebound.  No CVA tenderness. Other:  No focal neurological deficits.   ED  Results / Procedures / Treatments   Labs (all labs ordered are listed, but only abnormal results are displayed) Labs Reviewed  BASIC METABOLIC PANEL - Abnormal; Notable for the following components:      Result Value   Potassium 3.1 (*)    Glucose, Bld 133 (*)    Creatinine, Ser 1.03 (*)    Calcium 8.6 (*)    GFR, Estimated 55 (*)    All other components within normal limits  CBC - Abnormal; Notable for the following components:   RBC 3.06 (*)    Hemoglobin 8.1 (*)    HCT 26.3 (*)    RDW 18.6 (*)    All other components within normal limits  URINALYSIS, ROUTINE W REFLEX MICROSCOPIC - Abnormal; Notable for the following components:   Color, Urine YELLOW (*)    APPearance CLEAR (*)    All other components within normal limits  SARS CORONAVIRUS 2 BY RT PCR  CULTURE, BLOOD (ROUTINE X 2)  CULTURE, BLOOD (ROUTINE X 2)  EXPECTORATED  SPUTUM ASSESSMENT W GRAM STAIN, RFLX TO RESP C  LACTIC ACID, PLASMA  MAGNESIUM  PHOSPHORUS  PROCALCITONIN  LACTIC ACID, PLASMA  BASIC METABOLIC PANEL  CBC  D-DIMER, QUANTITATIVE  TROPONIN I (HIGH SENSITIVITY)     EKG Sinus tachycardia, ventricular rate 85.  PR 160, QRS 96, QTc 454.  No acute ST elevations or depression acute evidence of acute ischemia or infarct.   RADIOLOGY Chest x-ray: Slight increase in lung base opacities, subtle infiltrate is possible   I also independently reviewed and agree with radiologist interpretations.   PROCEDURES:  Critical Care performed: No   MEDICATIONS ORDERED IN ED: Medications  sodium chloride 0.9 % bolus 1,000 mL (has no administration in time range)  cefTRIAXone (ROCEPHIN) 2 g in sodium chloride 0.9 % 100 mL IVPB (has no administration in time range)  azithromycin (ZITHROMAX) 500 mg in sodium chloride 0.9 % 250 mL IVPB (has no administration in time range)  albuterol (PROVENTIL) (2.5 MG/3ML) 0.083% nebulizer solution 2.5 mg (has no administration in time range)  dextromethorphan-guaiFENesin  (MUCINEX DM) 30-600 MG per 12 hr tablet 1 tablet (has no administration in time range)  ipratropium-albuterol (DUONEB) 0.5-2.5 (3) MG/3ML nebulizer solution 3 mL (has no administration in time range)  potassium chloride SA (KLOR-CON M) CR tablet 40 mEq (has no administration in time range)  hydrOXYzine (ATARAX) tablet 10 mg (has no administration in time range)     IMPRESSION / MDM / ASSESSMENT AND PLAN / ED COURSE  I reviewed the triage vital signs and the nursing notes.                              Differential diagnosis includes, but is not limited to, generalized weakness from pneumonia, UTI, anemia, dehydration, polypharmacy, deconditioning  Patient's presentation is most consistent with acute presentation with potential threat to life or bodily function.  The patient is on the cardiac monitor to evaluate for evidence of arrhythmia and/or significant heart rate changes   36-year-old female with history of A-fib, hypertension, COPD, here with generalized weakness.  Patient has likely basilar pneumonia on chest x-ray and exam is consistent with this.  No evidence to suggest sepsis.  Pro-Cal negative.  CBC without leukocytosis.  BMP with mild dehydration most likely.  Will treat with antibiotics, nebulizer, plan to admit to medicine.   FINAL CLINICAL IMPRESSION(S) / ED DIAGNOSES   Final diagnoses:  Community acquired pneumonia, unspecified laterality  Generalized weakness     Rx / DC Orders   ED Discharge Orders     None        Note:  This document was prepared using Dragon voice recognition software and may include unintentional dictation errors.   Shaune Pollack, MD 08/20/23 2004

## 2023-08-20 NOTE — ED Notes (Signed)
This RN saw many tiny bugs crawling on patients clothing. Two bugs collected in container. Patient stated to this RN "Those are bed bugs"

## 2023-08-20 NOTE — ED Notes (Signed)
Lab called to send phlebotomist for blood draw

## 2023-08-20 NOTE — H&P (Incomplete)
History and Physical    Tracy Dickerson ZDG:[id number] DOB: 05-Aug-1941 DOA: 08/20/2023  Referring MD/NP/PA:   PCP: Dwana Melena, PA   Patient coming from:  The patient is coming from home.     Chief Complaint: syncope, weakness, SOB, cough  HPI: Tracy Dickerson is a 82 y.o. female with medical history significant of hypertension, hyperlipidemia, diabetes mellitus, GERD, breast cancer, CKD-3a, septic shock due to UTI, PAF on Eliquis, who presents with generalized weakness, syncope, SOB and cough.  Pt states that over the last 2 weeks, she has had progressive worsening generalized weakness. She has dizziness and lightheadedness. She passed out twice today.  No unilateral numbness or tinglings in extremities.  No facial droop or slurred speech.  She has dry cough and shortness of breath.  She had some chest pain in the morning, which has resolved.  No fever or chills.  No nausea, vomiting, diarrhea or abdominal pain.  No symptoms of UTI.  Patient states that she ran out of her medications for more than 2 weeks.  She did not take her medications in the past 2 weeks, including Eliquis.  Per ED RN, pt was found to bed bugs.   Data reviewed independently and ED Course: pt was found to have WBC 7.7, trop 12, D-dimer 1.85, BNP 233, stable renal function, potassium 3.1, negative COVID PCR, temperature normal, blood pressure 110/59, heart rate 68, RR 20, oxygen saturation 95% on room air.  CT of the head and neck is negative for acute injury.  Patient is placed on telemetry bed for observation.  Chest x-ray: Slight increase in lung base opacities. Subtle infiltrate is possible. Recommend follow-up.   Underlying fibrotic, interstitial changes again seen of the lungs. Please correlate with the history. Elevation of the right hemidiaphragm   EKG: I have personally reviewed.  Sinus rhythm, QTc 454, frequent PAC   Review of Systems:   General: no fevers, chills, no body weight gain, has  fatigue HEENT: no blurry vision, hearing changes or sore throat Respiratory: has dyspnea, coughing, no wheezing CV: had chest pain, no palpitations GI: no nausea, vomiting, abdominal pain, diarrhea, constipation GU: no dysuria, burning on urination, increased urinary frequency, hematuria  Ext: no leg edema Neuro: no unilateral weakness, numbness, or tingling, no vision change or hearing loss Skin: no rash, no skin tear. MSK: No muscle spasm, no deformity, no limitation of range of movement in spin. Has syncope Heme: No easy bruising.  Travel history: No recent long distant travel.   Allergy:  Allergies  Allergen Reactions   Oxycodone    Percocet [Oxycodone-Acetaminophen] Itching   Penicillins Rash    Has patient had a PCN reaction causing immediate rash, facial/tongue/throat swelling, SOB or lightheadedness with hypotension: Yes Has patient had a PCN reaction causing severe rash involving mucus membranes or skin necrosis: Unknown Has patient had a PCN reaction that required hospitalization: Unknown Has patient had a PCN reaction occurring within the last 10 years: No If all of the above answers are "NO", then may proceed with Cephalosporin use.    Past Medical History:  Diagnosis Date   Cancer (HCC)    Breast and lung   Diabetes mellitus without complication (HCC)    GERD (gastroesophageal reflux disease)    Heart disease    Hypertension     Past Surgical History:  Procedure Laterality Date   ABDOMINAL HYSTERECTOMY     APPENDECTOMY     BREAST SURGERY     breast cancer LEFT  ESOPHAGOGASTRODUODENOSCOPY (EGD) WITH PROPOFOL N/A 01/07/2023   Procedure: ESOPHAGOGASTRODUODENOSCOPY (EGD) WITH PROPOFOL;  Surgeon: Midge Minium, MD;  Location: [facility] ENDOSCOPY;  Service: Endoscopy;  Laterality: N/A;   OPEN REDUCTION INTERNAL FIXATION (ORIF) TIBIA/FIBULA FRACTURE Left 02/03/2018   Procedure: OPEN REDUCTION INTERNAL FIXATION (ORIF) DISTAL TIBIA FRACTURE;  Surgeon: Christena Flake, MD;   Location: [facility] ORS;  Service: Orthopedics;  Laterality: Left;  ankle/lower leg   TONSILLECTOMY      Social History:  reports that she has quit smoking. She has never used smokeless tobacco. She reports that she does not currently use alcohol. She reports that she does not use drugs.  Family History:  Family History  Problem Relation Age of Onset   Cancer Mother      Prior to Admission medications   Medication Sig Start Date End Date Taking? Authorizing Provider  acetaminophen (TYLENOL) 500 MG tablet Take 500 mg by mouth every 6 (six) hours as needed.    [provider]  albuterol (PROAIR HFA) 108 (90 Base) MCG/ACT inhaler Inhale 2 puffs into the lungs every 4 (four) hours as needed for wheezing or shortness of breath.     [provider]  apixaban (ELIQUIS) 5 MG TABS tablet Take 1 tablet (5 mg total) by mouth 2 (two) times daily. 06/20/23   Wouk, Wilfred Curtis, MD  atorvastatin (LIPITOR) 20 MG tablet Take 20 mg by mouth daily. 05/01/20   [provider]  cetirizine (ZYRTEC) 5 MG tablet Take 1 tablet (5 mg total) by mouth daily. 03/25/23 03/24/24  Pilar Jarvis, MD  cyanocobalamin (VITAMIN B12) 1000 MCG tablet Take 1 tablet (1,000 mcg total) by mouth daily. 06/20/23   Wouk, Wilfred Curtis, MD  fluticasone-salmeterol (ADVAIR) 250-50 MCG/ACT AEPB Inhale 1 puff into the lungs in the morning and at bedtime.    [provider]  gabapentin (NEURONTIN) 300 MG capsule Take 300 mg by mouth at bedtime. Take 2 caspules by mouth at bedtime    [provider]  lisinopril (ZESTRIL) 10 MG tablet Take 5 mg by mouth daily. 06/28/20   [provider]  meclizine (ANTIVERT) 12.5 MG tablet Take 1 tablet (12.5 mg total) by mouth 3 (three) times daily as needed for dizziness. 07/03/23   Merwyn Katos, MD  ondansetron (ZOFRAN ODT) 4 MG disintegrating tablet Take 1 tablet (4 mg total) by mouth every 8 (eight) hours as needed. 06/25/21   Menshew, Charlesetta Ivory, PA-C   pantoprazole (PROTONIX) 40 MG tablet Take 1 tablet (40 mg total) by mouth 2 (two) times daily. 06/20/23 06/19/24  Wouk, Wilfred Curtis, MD  SPIRIVA HANDIHALER 18 MCG inhalation capsule Place 1 capsule (18 mcg total) into inhaler and inhale daily. 02/09/22   Orvil Feil, PA-C  traMADol (ULTRAM) 50 MG tablet Take 50 mg by mouth 2 (two) times daily as needed.     [provider]  triamcinolone ointment (KENALOG) 0.1 % Apply 1 Application topically 2 (two) times daily.    [provider]    Physical Exam: Vitals:   08/20/23 2130 08/20/23 2230 08/20/23 2245 08/20/23 2300  BP: (!) 149/69 (!) 147/101  102/87  Pulse: (!) 143 (!) 107 85   Resp: 16 (!) 23 (!) 23   Temp:      SpO2: 95% 100% 99%   Weight:      Height:       General: Not in acute distress HEENT:       Eyes: PERRL, EOMI, no jaundice  ENT: No discharge from the ears and nose, no pharynx injection, no tonsillar enlargement.        Neck: No JVD, no bruit, no mass felt. Heme: No neck lymph node enlargement. Cardiac: S1/S2, RRR, No murmurs, No gallops or rubs. Respiratory: No rales, wheezing, rhonchi or rubs. GI: Soft, nondistended, nontender, no rebound pain, no organomegaly, BS present. GU: No hematuria Ext: No pitting leg edema bilaterally. 1+DP/PT pulse bilaterally. Musculoskeletal: No joint deformities, No joint redness or warmth, no limitation of ROM in spin. Skin: No rashes.  Neuro: Alert, oriented X3, cranial nerves II-XII grossly intact, moves all extremities normally.  Psych: Patient is not psychotic, no suicidal or hemocidal ideation.  Labs on Admission: I have personally reviewed following labs and imaging studies  CBC: Recent Labs  Lab 08/20/23 1321  WBC 7.7  HGB 8.1*  HCT 26.3*  MCV 85.9  PLT 254   Basic Metabolic Panel: Recent Labs  Lab 08/20/23 1321  NA 140  K 3.1*  CL 107  CO2 24  GLUCOSE 133*  BUN 11  CREATININE 1.03*  CALCIUM 8.6*  MG 2.3  PHOS 2.7   GFR: Estimated  Creatinine Clearance: 49.4 mL/min (A) (by C-G formula based on SCr of 1.03 mg/dL (H)). Liver Function Tests: No results for input(s): "AST", "ALT", "ALKPHOS", "BILITOT", "PROT", "ALBUMIN" in the last 168 hours. No results for input(s): "LIPASE", "AMYLASE" in the last 168 hours. No results for input(s): "AMMONIA" in the last 168 hours. Coagulation Profile: No results for input(s): "INR", "PROTIME" in the last 168 hours. Cardiac Enzymes: No results for input(s): "CKTOTAL", "CKMB", "CKMBINDEX", "TROPONINI" in the last 168 hours. BNP (last 3 results) No results for input(s): "PROBNP" in the last 8760 hours. HbA1C: No results for input(s): "HGBA1C" in the last 72 hours. CBG: No results for input(s): "GLUCAP" in the last 168 hours. Lipid Profile: No results for input(s): "CHOL", "HDL", "LDLCALC", "TRIG", "CHOLHDL", "LDLDIRECT" in the last 72 hours. Thyroid Function Tests: No results for input(s): "TSH", "T4TOTAL", "FREET4", "T3FREE", "THYROIDAB" in the last 72 hours. Anemia Panel: No results for input(s): "VITAMINB12", "FOLATE", "FERRITIN", "TIBC", "IRON", "RETICCTPCT" in the last 72 hours. Urine analysis:    Component Value Date/Time   COLORURINE YELLOW (A) 08/20/2023 1709   APPEARANCEUR CLEAR (A) 08/20/2023 1709   APPEARANCEUR Hazy 01/07/2015 1812   LABSPEC 1.014 08/20/2023 1709   LABSPEC 1.014 01/07/2015 1812   PHURINE 6.0 08/20/2023 1709   GLUCOSEU NEGATIVE 08/20/2023 1709   GLUCOSEU Negative 01/07/2015 1812   HGBUR NEGATIVE 08/20/2023 1709   BILIRUBINUR NEGATIVE 08/20/2023 1709   BILIRUBINUR Negative 01/07/2015 1812   KETONESUR NEGATIVE 08/20/2023 1709   PROTEINUR NEGATIVE 08/20/2023 1709   NITRITE NEGATIVE 08/20/2023 1709   LEUKOCYTESUR NEGATIVE 08/20/2023 1709   LEUKOCYTESUR Negative 01/07/2015 1812   Sepsis Labs: @LABRCNTIP (procalcitonin:4,lacticidven:4) ) Recent Results (from the past 240 hour(s))  Urine Culture     Status: Abnormal   Collection Time: 08/11/23  4:54  PM   Specimen: Urine, Random  Result Value Ref Range Status   Specimen Description   Final    URINE, RANDOM Performed at [facility], [address], [place], [place] [address]    Special Requests   Final    NONE Performed at [facility], [address] [place], [place] [address]    Culture >=100,000 COLONIES/mL ENTEROCOCCUS FAECALIS (A)  Final   Report Status 08/14/2023 FINAL  Final   Organism ID, Bacteria ENTEROCOCCUS FAECALIS (A)  Final      Susceptibility  Enterococcus faecalis - MIC*    AMPICILLIN 8 SENSITIVE Sensitive     NITROFURANTOIN <=16 SENSITIVE Sensitive     VANCOMYCIN 1 SENSITIVE Sensitive     * >=100,000 COLONIES/mL ENTEROCOCCUS FAECALIS  SARS Coronavirus 2 by RT PCR (hospital order, performed in [facility] hospital lab) *cepheid single result test* Anterior Nasal Swab     Status: None   Collection Time: 08/20/23  5:09 PM   Specimen: Anterior Nasal Swab  Result Value Ref Range Status   SARS Coronavirus 2 by RT PCR NEGATIVE NEGATIVE Final    Comment: (NOTE) SARS-CoV-2 target nucleic acids are NOT DETECTED.  The SARS-CoV-2 RNA is generally detectable in upper and lower respiratory specimens during the acute phase of infection. The lowest concentration of SARS-CoV-2 viral copies this assay can detect is 250 copies / mL. A negative result does not preclude SARS-CoV-2 infection and should not be used as the sole basis for treatment or other patient management decisions.  A negative result may occur with improper specimen collection / handling, submission of specimen other than nasopharyngeal swab, presence of viral mutation(s) within the areas targeted by this assay, and inadequate number of viral copies (<250 copies / mL). A negative result must be combined with clinical observations, patient history, and epidemiological information.  Fact Sheet for Patients:   RoadLapTop.co.za  Fact Sheet for Healthcare  Providers: http://kim-miller.com/  This test is not yet approved or  cleared by the [place] FDA and has been authorized for detection and/or diagnosis of SARS-CoV-2 by FDA under an Emergency Use Authorization (EUA).  This EUA will remain in effect (meaning this test can be used) for the duration of the COVID-19 declaration under Section 564(b)(1) of the Act, 21 U.S.C. section 360bbb-3(b)(1), unless the authorization is terminated or revoked sooner.  Performed at [facility], [address] [place], [place] [address]      Radiological Exams on Admission: CT HEAD WO CONTRAST ( )  Result Date: 08/20/2023 CLINICAL DATA:  Head trauma, moderate-severe; Neck trauma (Age >= 65y) EXAM: CT HEAD WITHOUT CONTRAST CT CERVICAL SPINE WITHOUT CONTRAST TECHNIQUE: Multidetector CT imaging of the head and cervical spine was performed following the standard protocol without intravenous contrast. Multiplanar CT image reconstructions of the cervical spine were also generated. RADIATION DOSE REDUCTION: This exam was performed according to the departmental dose-optimization program which includes automated exposure control, adjustment of the mA and/or kV according to patient size and/or use of iterative reconstruction technique. COMPARISON:  CT Head 02/25/23 CT c spine 02/25/23 FINDINGS: CT HEAD FINDINGS Brain: No hemorrhage. No hydrocephalus. No extra-axial fluid collection. No CT evidence of an acute cortical infarct. No mass effect. No mass lesion. Vascular: No hyperdense vessel or unexpected calcification. Skull: Normal. Negative for fracture or focal lesion. Sinuses/Orbits: No middle ear or mastoid effusion. Paranasal sinuses are clear. Orbits are unremarkable. Other: None. CT CERVICAL SPINE FINDINGS Alignment: Straightening of the normal cervical lordosis. Skull base and vertebrae: No acute fracture. No primary bone lesion or focal pathologic process. Soft tissues and spinal  canal: No prevertebral fluid or swelling. No visible canal hematoma. Disc levels:  No CT evidence of high-grade spinal canal stenosis. Upper chest: Negative. Other: No IMPRESSION: 1. No CT evidence of intracranial injury. 2. No acute fracture or traumatic subluxation of the cervical spine. Electronically Signed   By: Lorenza Cambridge M.D.   On: 08/20/2023 19:03   CT Cervical Spine Wo Contrast  Result Date: 08/20/2023 CLINICAL DATA:  Head trauma, moderate-severe; Neck trauma (Age >=  65y) EXAM: CT HEAD WITHOUT CONTRAST CT CERVICAL SPINE WITHOUT CONTRAST TECHNIQUE: Multidetector CT imaging of the head and cervical spine was performed following the standard protocol without intravenous contrast. Multiplanar CT image reconstructions of the cervical spine were also generated. RADIATION DOSE REDUCTION: This exam was performed according to the departmental dose-optimization program which includes automated exposure control, adjustment of the mA and/or kV according to patient size and/or use of iterative reconstruction technique. COMPARISON:  CT Head 02/25/23 CT c spine 02/25/23 FINDINGS: CT HEAD FINDINGS Brain: No hemorrhage. No hydrocephalus. No extra-axial fluid collection. No CT evidence of an acute cortical infarct. No mass effect. No mass lesion. Vascular: No hyperdense vessel or unexpected calcification. Skull: Normal. Negative for fracture or focal lesion. Sinuses/Orbits: No middle ear or mastoid effusion. Paranasal sinuses are clear. Orbits are unremarkable. Other: None. CT CERVICAL SPINE FINDINGS Alignment: Straightening of the normal cervical lordosis. Skull base and vertebrae: No acute fracture. No primary bone lesion or focal pathologic process. Soft tissues and spinal canal: No prevertebral fluid or swelling. No visible canal hematoma. Disc levels:  No CT evidence of high-grade spinal canal stenosis. Upper chest: Negative. Other: No IMPRESSION: 1. No CT evidence of intracranial injury. 2. No acute fracture or  traumatic subluxation of the cervical spine. Electronically Signed   By: Lorenza Cambridge M.D.   On: 08/20/2023 19:03   DG Chest Portable 1 View  Result Date: 08/20/2023 CLINICAL DATA:  Cough and weakness EXAM: PORTABLE CHEST 1 VIEW COMPARISON:  X-ray 06/18/2023 and older. FINDINGS: Underinflation. Diffuse interstitial changes are identified, slightly more pronounced today than previous. Elevation of the right hemidiaphragm. Slightly more opacity at the bases. No pneumothorax. Enlarged cardiopericardial silhouette. Tortuous ectatic aorta. Surgical clips in the left axillary region. IMPRESSION: Slight increase in lung base opacities. Subtle infiltrate is possible. Recommend follow-up. Underlying fibrotic, interstitial changes again seen of the lungs. Please correlate with the history. Elevation of the right hemidiaphragm Electronically Signed   By: Karen Kays M.D.   On: 08/20/2023 17:05      Assessment/Plan Principal Problem:   Syncope Active Problems:   Hypokalemia   HTN (hypertension)   COPD (chronic obstructive pulmonary disease) (HCC)   PAF (paroxysmal atrial fibrillation) (HCC)   CKD stage 3a, GFR 45-59 ml/min (HCC)   Normocytic anemia   Type II diabetes mellitus with renal manifestations (HCC)   Infestation by bed bug   Obesity (BMI 30-39.9)   Assessment and Plan:  Syncope: Etiology is not clear.  Patient reports shortness of breath and had some chest pain earlier, D-dimer +1.85, will need to rule out PE.  Other differential diagnosis include orthostatic status and vasovagal syncope.  No focal neurodeficit on physical examination.  CT head negative.  Low suspicions for stroke.  -Will place in tele bed for obs - Orthostatic vital signs  - CTA to r/o PE - f/u LE doppler  - Neuro checks  - IVF: 1L of NS - PT/OT eval and treat  Hypokalemia: Potassium 3.1.  Magnesium 2.3, phosphorus 2.7 -Repleted potassium  HTN (hypertension): Blood pressure 110/59. -Hold lisinopril due to  softer blood pressure -IV hydralazine as needed  COPD (chronic obstructive pulmonary disease) (HCC): Patient reports dry cough, shortness of breath, no wheezing on auscultation.  Does not seem to have COPD exacerbation.  Chest x-ray showed some basilar opacity, but the patient does not have fever or leukocytosis.  Procalcitonin < 0.10.  Clinically does not seem to have pneumonia.  Patient received 1 dose of Rocephin and azithromycin  in ED, will discontinue antibiotics. -Bronchodilators -As needed Mucinex  PAF (paroxysmal atrial fibrillation) (HCC): HR 68 -resume Eliquis  CKD stage 3a, GFR 45-59 ml/min [facility]): Renal function stable.  Baseline creatinine 1.04 in 08/01/2023.  Her creatinine is 1.03, BUN 11, GFR 55 -Follow-up with BMP  Normocytic anemia: Hemoglobin stable 8.1 (8.1 08/11/2023). -Follow-up with CBC  Type II diabetes mellitus with renal manifestations [facility]): Recent A1c 5.9, well-controlled.  Patient seem to be not taking medications. -Check blood sugar every morning  Infestation by bed bug -As needed hydroxyzine for itchy  Obesity (BMI 30-39.9): Body weight 90.7 kg, BMI 32.28 -Exercise and healthy diet -Encourage losing weight        DVT ppx: on Eliquis  Code Status: Full code     Family Communication: not done, no family member is at bed side.      Disposition Plan:  Anticipate discharge back to previous environment  Consults called:  none  Admission status and Level of care: Telemetry Medical:   for obs    Dispo: The patient is from: Home              Anticipated d/c is to: Home              Anticipated d/c date is: 1 day              Patient currently is not medically stable to d/c.    Severity of Illness:  The appropriate patient status for this patient is OBSERVATION. Observation status is judged to be reasonable and necessary in order to provide the required intensity of service to ensure the patient's safety. The patient's presenting symptoms,  physical exam findings, and initial radiographic and laboratory data in the context of their medical condition is felt to place them at decreased risk for further clinical deterioration. Furthermore, it is anticipated that the patient will be medically stable for discharge from the hospital within 2 midnights of admission.        Date of Service 08/21/2023    Lorretta Harp [facility]   If 7PM-7AM, please contact night-coverage [email or web address] 08/21/2023, 12:40 AM

## 2023-08-20 NOTE — ED Notes (Signed)
MD Erma Heritage at bedside

## 2023-08-20 NOTE — ED Notes (Signed)
Patient personal belongings placed in trash bag, tied up in room

## 2023-08-20 NOTE — ED Triage Notes (Signed)
Pt to ED EMS from home for near syncope, reports started feeling dizzy. Reports pain to bilateral legs.

## 2023-08-20 NOTE — ED Notes (Signed)
Provided water per pt request pt aox4 appears in nad dry cough noted

## 2023-08-20 NOTE — ED Notes (Signed)
Patients cane at bedside. Patients black jacket placed in trash bag at bedside

## 2023-08-20 NOTE — ED Notes (Signed)
Family updated as to patient's status. Tracy Dickerson

## 2023-08-21 ENCOUNTER — Encounter: Payer: Self-pay | Admitting: Internal Medicine

## 2023-08-21 ENCOUNTER — Observation Stay: Payer: 59 | Admitting: Anesthesiology

## 2023-08-21 DIAGNOSIS — R55 Syncope and collapse: Secondary | ICD-10-CM | POA: Diagnosis not present

## 2023-08-21 DIAGNOSIS — D509 Iron deficiency anemia, unspecified: Secondary | ICD-10-CM | POA: Diagnosis not present

## 2023-08-21 DIAGNOSIS — D500 Iron deficiency anemia secondary to blood loss (chronic): Secondary | ICD-10-CM

## 2023-08-21 LAB — BASIC METABOLIC PANEL
Anion gap: 6 (ref 5–15)
BUN: 11 mg/dL (ref 8–23)
CO2: 25 mmol/L (ref 22–32)
Calcium: 8 mg/dL — ABNORMAL LOW (ref 8.9–10.3)
Chloride: 109 mmol/L (ref 98–111)
Creatinine, Ser: 1 mg/dL (ref 0.44–1.00)
GFR, Estimated: 57 mL/min — ABNORMAL LOW (ref >60)
Glucose, Bld: 89 mg/dL (ref 70–99)
Potassium: 4 mmol/L (ref 3.5–5.1)
Sodium: 140 mmol/L (ref 135–145)

## 2023-08-21 LAB — RETICULOCYTES
Immature Retic Fract: 22.1 % — ABNORMAL HIGH (ref 2.3–15.9)
RBC.: 2.69 MIL/uL — ABNORMAL LOW (ref 3.87–5.11)
Retic Count, Absolute: 58.4 10*3/uL (ref 19.0–186.0)
Retic Ct Pct: 2.2 % (ref 0.4–3.1)

## 2023-08-21 LAB — OCCULT BLOOD X 1 CARD TO LAB, STOOL: Fecal Occult Bld: NEGATIVE

## 2023-08-21 LAB — HEMOGLOBIN AND HEMATOCRIT, BLOOD
HCT: 26.3 % — ABNORMAL LOW (ref 36.0–46.0)
Hemoglobin: 8.3 g/dL — ABNORMAL LOW (ref 12.0–15.0)

## 2023-08-21 LAB — CBC
HCT: 22.1 % — ABNORMAL LOW (ref 36.0–46.0)
Hemoglobin: 6.3 g/dL — ABNORMAL LOW (ref 12.0–15.0)
MCH: 23.9 pg — ABNORMAL LOW (ref 26.0–34.0)
MCHC: 28.5 g/dL — ABNORMAL LOW (ref 30.0–36.0)
MCV: 83.7 fL (ref 80.0–100.0)
Platelets: 205 10*3/uL (ref 150–400)
RBC: 2.64 MIL/uL — ABNORMAL LOW (ref 3.87–5.11)
RDW: 16.2 % — ABNORMAL HIGH (ref 11.5–15.5)
WBC: 7.5 10*3/uL (ref 4.0–10.5)
nRBC: 0 % (ref 0.0–0.2)

## 2023-08-21 LAB — VITAMIN B12: Vitamin B-12: 205 pg/mL (ref 180–914)

## 2023-08-21 LAB — CBG MONITORING, ED: Glucose-Capillary: 82 mg/dL (ref 70–99)

## 2023-08-21 LAB — HEPATIC FUNCTION PANEL
ALT: 8 U/L (ref 0–44)
AST: 16 U/L (ref 15–41)
Albumin: 2.7 g/dL — ABNORMAL LOW (ref 3.5–5.0)
Alkaline Phosphatase: 61 U/L (ref 38–126)
Bilirubin, Direct: <0.1 mg/dL (ref 0.0–0.2)
Total Bilirubin: 0.4 mg/dL (ref <1.2)
Total Protein: 5.9 g/dL — ABNORMAL LOW (ref 6.5–8.1)

## 2023-08-21 LAB — CK: Total CK: 20 U/L — ABNORMAL LOW (ref 38–234)

## 2023-08-21 LAB — EXPECTORATED SPUTUM ASSESSMENT W GRAM STAIN, RFLX TO RESP C

## 2023-08-21 LAB — VITAMIN D 25 HYDROXY (VIT D DEFICIENCY, FRACTURES): Vit D, 25-Hydroxy: 17.29 ng/mL — ABNORMAL LOW (ref 30–100)

## 2023-08-21 LAB — LACTATE DEHYDROGENASE: LDH: 126 U/L (ref 98–192)

## 2023-08-21 LAB — FERRITIN: Ferritin: 9 ng/mL — ABNORMAL LOW (ref 11–307)

## 2023-08-21 LAB — IRON AND TIBC
Iron: 16 ug/dL — ABNORMAL LOW (ref 28–170)
Saturation Ratios: 4 % — ABNORMAL LOW (ref 10.4–31.8)
TIBC: 365 ug/dL (ref 250–450)
UIBC: 349 ug/dL

## 2023-08-21 LAB — LACTIC ACID, PLASMA: Lactic Acid, Venous: 0.8 mmol/L (ref 0.5–1.9)

## 2023-08-21 LAB — PREPARE RBC (CROSSMATCH)

## 2023-08-21 LAB — FOLATE: Folate: 7.1 ng/mL (ref >5.9)

## 2023-08-21 MED ORDER — BISACODYL 5 MG PO TBEC
10.0000 mg | DELAYED_RELEASE_TABLET | Freq: Every day | ORAL | Status: DC
  Administered 2023-08-21 – 2023-08-29 (×8): 10 mg via ORAL
  Filled 2023-08-21 (×8): qty 2

## 2023-08-21 MED ORDER — IPRATROPIUM-ALBUTEROL 0.5-2.5 (3) MG/3ML IN SOLN
3.0000 mL | Freq: Two times a day (BID) | RESPIRATORY_TRACT | Status: DC
  Administered 2023-08-22: 3 mL via RESPIRATORY_TRACT
  Filled 2023-08-21: qty 3

## 2023-08-21 MED ORDER — SODIUM CHLORIDE 0.9 % IV SOLN: INTRAVENOUS | Status: AC

## 2023-08-21 MED ORDER — ONDANSETRON HCL 4 MG/2ML IJ SOLN: 4.0000 mg | Freq: Three times a day (TID) | INTRAMUSCULAR | Status: DC | PRN

## 2023-08-21 MED ORDER — APIXABAN 5 MG PO TABS
5.0000 mg | ORAL_TABLET | Freq: Two times a day (BID) | ORAL | Status: DC
  Administered 2023-08-21: 5 mg via ORAL
  Filled 2023-08-21: qty 1

## 2023-08-21 MED ORDER — POLYETHYLENE GLYCOL 3350 17 G PO PACK
17.0000 g | PACK | Freq: Two times a day (BID) | ORAL | Status: DC
  Administered 2023-08-21 – 2023-08-30 (×13): 17 g via ORAL
  Filled 2023-08-21 (×17): qty 1

## 2023-08-21 MED ORDER — GABAPENTIN 300 MG PO CAPS
600.0000 mg | ORAL_CAPSULE | Freq: Every day | ORAL | Status: DC
  Administered 2023-08-21 – 2023-09-02 (×14): 600 mg via ORAL
  Filled 2023-08-21 (×14): qty 2

## 2023-08-21 MED ORDER — FOLIC ACID 1 MG PO TABS
1.0000 mg | ORAL_TABLET | Freq: Every day | ORAL | Status: DC
  Administered 2023-08-21 – 2023-09-03 (×14): 1 mg via ORAL
  Filled 2023-08-21 (×14): qty 1

## 2023-08-21 MED ORDER — VITAMIN B-12 1000 MCG PO TABS
1000.0000 ug | ORAL_TABLET | Freq: Every day | ORAL | Status: DC
  Administered 2023-08-21 – 2023-09-03 (×14): 1000 ug via ORAL
  Filled 2023-08-21 (×3): qty 1
  Filled 2023-08-21: qty 2
  Filled 2023-08-21 (×10): qty 1

## 2023-08-21 MED ORDER — MECLIZINE HCL 25 MG PO TABS: 12.5000 mg | ORAL_TABLET | Freq: Three times a day (TID) | ORAL | Status: DC | PRN

## 2023-08-21 MED ORDER — INFLUENZA VAC A&B SURF ANT ADJ 0.5 ML IM SUSY
0.5000 mL | PREFILLED_SYRINGE | INTRAMUSCULAR | Status: AC
  Administered 2023-08-22: 0.5 mL via INTRAMUSCULAR
  Filled 2023-08-21: qty 0.5

## 2023-08-21 MED ORDER — HYDRALAZINE HCL 20 MG/ML IJ SOLN: 5.0000 mg | INTRAMUSCULAR | Status: DC | PRN

## 2023-08-21 MED ORDER — TRAMADOL HCL 50 MG PO TABS
50.0000 mg | ORAL_TABLET | Freq: Two times a day (BID) | ORAL | Status: DC | PRN
  Administered 2023-08-21 – 2023-08-23 (×2): 50 mg via ORAL
  Filled 2023-08-21 (×2): qty 1

## 2023-08-21 MED ORDER — PANTOPRAZOLE SODIUM 40 MG PO TBEC
40.0000 mg | DELAYED_RELEASE_TABLET | Freq: Two times a day (BID) | ORAL | Status: DC
  Administered 2023-08-21: 40 mg via ORAL
  Filled 2023-08-21: qty 1

## 2023-08-21 MED ORDER — SODIUM CHLORIDE 0.9% IV SOLUTION
Freq: Once | INTRAVENOUS | Status: AC
  Filled 2023-08-21: qty 250

## 2023-08-21 MED ORDER — PANTOPRAZOLE SODIUM 40 MG IV SOLR
40.0000 mg | Freq: Two times a day (BID) | INTRAVENOUS | Status: DC
  Administered 2023-08-21 – 2023-08-25 (×9): 40 mg via INTRAVENOUS
  Filled 2023-08-21 (×9): qty 10

## 2023-08-21 MED ORDER — ATORVASTATIN CALCIUM 20 MG PO TABS
20.0000 mg | ORAL_TABLET | Freq: Every day | ORAL | Status: DC
  Administered 2023-08-21 – 2023-09-03 (×14): 20 mg via ORAL
  Filled 2023-08-21 (×14): qty 1

## 2023-08-21 MED ORDER — MOMETASONE FURO-FORMOTEROL FUM 200-5 MCG/ACT IN AERO
2.0000 | INHALATION_SPRAY | Freq: Two times a day (BID) | RESPIRATORY_TRACT | Status: DC
  Administered 2023-08-22 – 2023-09-03 (×24): 2 via RESPIRATORY_TRACT
  Filled 2023-08-21 (×2): qty 8.8

## 2023-08-21 MED ORDER — BISACODYL 10 MG RE SUPP: 10.0000 mg | Freq: Every day | RECTAL | Status: DC | PRN

## 2023-08-21 MED ORDER — ACETAMINOPHEN 325 MG PO TABS
650.0000 mg | ORAL_TABLET | Freq: Four times a day (QID) | ORAL | Status: DC | PRN
  Administered 2023-08-22 – 2023-08-30 (×3): 650 mg via ORAL
  Filled 2023-08-21 (×3): qty 2

## 2023-08-21 MED ORDER — IRON SUCROSE 300 MG IVPB - SIMPLE MED
300.0000 mg | Freq: Once | Status: AC
  Administered 2023-08-22: 300 mg via INTRAVENOUS
  Filled 2023-08-21: qty 300

## 2023-08-21 MED ORDER — PEG 3350-KCL-NA BICARB-NACL 420 G PO SOLR
4000.0000 mL | Freq: Once | ORAL | Status: AC
  Administered 2023-08-21: 4000 mL via ORAL
  Filled 2023-08-21 (×3): qty 4000

## 2023-08-21 NOTE — Consult Note (Signed)
Tracy Repress, MD [address]  [address]  [place], [place] [address]  Main: [phone]  Fax: [phone] Pager: [phone]   Consultation  Referring Provider:     No ref. provider found Primary Care Physician:  Tracy Dickerson, [place] Primary Gastroenterologist: [facility]         Reason for Consultation: Chronic iron deficiency anemia  Date of Admission:  08/20/2023 Date of Consultation:  08/21/2023         HPI:   Tracy Dickerson is a 82 y.o. female history of breast and lung cancer, diabetes, chronic GERD, paroxysmal A-fib on Eliquis, COPD presented with severe symptomatic anemia.  Symptoms included near syncope, bilateral leg cramps, progressive worsening of generalized weakness for last 2 weeks and difficulty getting around the house, mild increase in her cough.  She came yesterday to the ER with the symptoms and she fell in her living room, without any head injury.  Vitals in the ER revealed borderline low blood pressure, otherwise normal, labs show mildly low potassium, normal BUN, mildly elevated creatinine, hemoglobin 8.1, normal MCV, normal platelets, hemoglobin dropped to 6.3, severe iron deficiency with elevated TIBC, low iron which is chronic, normal folic acid.  Therefore, GI is consulted for further evaluation.  Patient did not take Eliquis for last 2 weeks.  She had similar presentation in end of September 2024 in setting of [facility] powder use, acute on chronic anemia.  Because, hemoglobin appropriately responded to blood transfusion and no evidence of active GI bleed, endoscopic evaluation was not performed at that time  She had another presentation in 12/2022 secondary to hematemesis, underwent EGD which was normal  NSAIDs: None  Antiplts/Anticoagulants/Anti thrombotics: Eliquis for history of A-fib, last dose 2 weeks ago  GI Procedures: EGD in 12/2022, normal  Past Medical History:  Diagnosis Date   Cancer (HCC)    Breast and lung   Diabetes mellitus without  complication (HCC)    GERD (gastroesophageal reflux disease)    Heart disease    Hypertension     Past Surgical History:  Procedure Laterality Date   ABDOMINAL HYSTERECTOMY     APPENDECTOMY     BREAST SURGERY     breast cancer LEFT   ESOPHAGOGASTRODUODENOSCOPY (EGD) WITH PROPOFOL N/A 01/07/2023   Procedure: ESOPHAGOGASTRODUODENOSCOPY (EGD) WITH PROPOFOL;  Surgeon: Tracy Minium, MD;  Location: [facility] ENDOSCOPY;  Service: Endoscopy;  Laterality: N/A;   OPEN REDUCTION INTERNAL FIXATION (ORIF) TIBIA/FIBULA FRACTURE Left 02/03/2018   Procedure: OPEN REDUCTION INTERNAL FIXATION (ORIF) DISTAL TIBIA FRACTURE;  Surgeon: Tracy Flake, MD;  Location: [facility] ORS;  Service: Orthopedics;  Laterality: Left;  ankle/lower leg   TONSILLECTOMY       Current Facility-Administered Medications:    acetaminophen (TYLENOL) tablet 650 mg, 650 mg, Oral, Q6H PRN, Lorretta Harp, MD   albuterol (PROVENTIL) (2.5 MG/3ML) 0.083% nebulizer solution 2.5 mg, 2.5 mg, Inhalation, Q4H PRN, Lorretta Harp, MD   atorvastatin (LIPITOR) tablet 20 mg, 20 mg, Oral, Daily, Lorretta Harp, MD, 20 mg at 08/21/23 0945   bisacodyl (DULCOLAX) EC tablet 10 mg, 10 mg, Oral, QHS, Tracy Santa, MD   bisacodyl (DULCOLAX) suppository 10 mg, 10 mg, Rectal, Daily PRN, Tracy Santa, MD   cyanocobalamin (VITAMIN B12) tablet 1,000 mcg, 1,000 mcg, Oral, Daily, Lorretta Harp, MD, 1,000 mcg at 08/21/23 0945   dextromethorphan-guaiFENesin (MUCINEX DM) 30-600 MG per 12 hr tablet 1 tablet, 1 tablet, Oral, BID PRN, Lorretta Harp, MD   folic acid (FOLVITE) tablet 1 mg,  1 mg, Oral, Daily, Tracy Santa, MD, 1 mg at 08/21/23 1204   gabapentin (NEURONTIN) capsule 600 mg, 600 mg, Oral, QHS, Lorretta Harp, MD, 600 mg at 08/21/23 0115   hydrALAZINE (APRESOLINE) injection 5 mg, 5 mg, Intravenous, Q2H PRN, Lorretta Harp, MD   hydrOXYzine (ATARAX) tablet 10 mg, 10 mg, Oral, TID PRN, Lorretta Harp, MD   [START ON 08/22/2023] influenza vaccine adjuvanted (FLUAD) injection 0.5 mL, 0.5 mL,  Intramuscular, Tomorrow-1000, Tracy Dickerson, Dileep, MD   ipratropium-albuterol (DUONEB) 0.5-2.5 (3) MG/3ML nebulizer solution 3 mL, 3 mL, Nebulization, Q6H, Lorretta Harp, MD, 3 mL at 08/21/23 1418   [START ON 08/22/2023] iron sucrose (VENOFER) 300 mg in sodium chloride 0.9 % 250 mL IVPB, 300 mg, Intravenous, Once, Tracy Dickerson, Tracy Dubonnet, MD   meclizine (ANTIVERT) tablet 12.5 mg, 12.5 mg, Oral, TID PRN, Lorretta Harp, MD   mometasone-formoterol (DULERA) 200-5 MCG/ACT inhaler 2 puff, 2 puff, Inhalation, BID, Lorretta Harp, MD   ondansetron [facility]) injection 4 mg, 4 mg, Intravenous, Q8H PRN, Lorretta Harp, MD   pantoprazole (PROTONIX) injection 40 mg, 40 mg, Intravenous, Q12H, Tracy Santa, MD, 40 mg at 08/21/23 1610   polyethylene glycol (MIRALAX / GLYCOLAX) packet 17 g, 17 g, Oral, BID, Tracy Santa, MD, 17 g at 08/21/23 0944   traMADol (ULTRAM) tablet 50 mg, 50 mg, Oral, BID PRN, Lorretta Harp, MD, 50 mg at 08/21/23 0120   Family History  Problem Relation Age of Onset   Cancer Mother      Social History   Tobacco Use   Smoking status: Former   Smokeless tobacco: Never  Vaping Use   Vaping status: Never Used  Substance Use Topics   Alcohol use: Not Currently   Drug use: No    Allergies as of 08/20/2023 - Review Complete 08/20/2023  Allergen Reaction Noted   Oxycodone  03/13/2018   Percocet [oxycodone-acetaminophen] Itching 12/24/2015   Penicillins Rash 02/06/2015    Review of Systems:    All systems reviewed and negative except where noted in HPI.   Physical Exam:  Vital signs in last 24 hours: Temp:  [97.5 F (36.4 C)-98.3 F (36.8 C)] 97.7 F (36.5 C) (11/28 1606) Pulse Rate:  [72-143] 85 (11/28 1606) Resp:  [14-23] 16 (11/28 1606) BP: (95-175)/(50-101) 122/50 (11/28 1606) SpO2:  [93 %-100 %] 100 % (11/28 1606) Last BM Date : 08/20/23 (per patient) General:   Pleasant, cooperative in NAD Head:  Normocephalic and atraumatic. Eyes:   No icterus.   Conjunctiva pink. PERRLA. Ears:  Normal  auditory acuity. Neck:  Supple; no masses or thyroidomegaly Lungs: Respirations even and unlabored. Lungs clear to auscultation bilaterally.   No wheezes, crackles, or rhonchi.  Heart:  Regular rate and rhythm;  Without murmur, clicks, rubs or gallops Abdomen:  Soft, nondistended, nontender. Normal bowel sounds. No appreciable masses or hepatomegaly.  No rebound or guarding.  Rectal:  Not performed. Msk:  Symmetrical without gross deformities.  Strength generalized weakness Extremities:  Without edema, cyanosis or clubbing. Neurologic:  Alert and oriented x2;  grossly normal neurologically. Skin:  Intact without significant lesions or rashes. Psych:  Alert and cooperative. Normal affect.  LAB RESULTS:    Latest Ref Rng & Units 08/21/2023    4:08 PM 08/21/2023    5:28 AM 08/20/2023    1:21 PM  CBC  WBC 4.0 - 10.5 K/uL  7.5  7.7   Hemoglobin 12.0 - 15.0 g/dL 8.3  6.3  8.1   Hematocrit 36.0 - 46.0 % 26.3  22.1  26.3   Platelets 150 - 400 K/uL  205  254     BMET    Latest Ref Rng & Units 08/21/2023    5:28 AM 08/20/2023    1:21 PM 08/11/2023    4:25 PM  BMP  Glucose 70 - 99 mg/dL 89  474  259   BUN 8 - 23 mg/dL 11  11  13    Creatinine 0.44 - 1.00 mg/dL 5.63  8.75  6.43   Sodium 135 - 145 mmol/L 140  140  140   Potassium 3.5 - 5.1 mmol/L 4.0  3.1  3.6   Chloride 98 - 111 mmol/L 109  107  107   CO2 22 - 32 mmol/L 25  24  26    Calcium 8.9 - 10.3 mg/dL 8.0  8.6  8.9     LFT    Latest Ref Rng & Units 08/21/2023    8:39 AM 08/11/2023    4:25 PM 07/08/2023    5:19 PM  Hepatic Function  Total Protein 6.5 - 8.1 g/dL 5.9  7.1  7.0   Albumin 3.5 - 5.0 g/dL 2.7  3.6  3.5   AST 15 - 41 U/L 16  16  18    ALT 0 - 44 U/L 8  9  11    Alk Phosphatase 38 - 126 U/L 61  73  77   Total Bilirubin <1.2 mg/dL 0.4  0.2  0.5   Bilirubin, Direct 0.0 - 0.2 mg/dL <3.2        STUDIES: US Venous Img Lower Bilateral (DVT)  Result Date: 08/21/2023 CLINICAL DATA:  Positive D-dimer. EXAM:  BILATERAL LOWER EXTREMITY VENOUS DOPPLER ULTRASOUND TECHNIQUE: Gray-scale sonography with graded compression, as well as color Doppler and duplex ultrasound were performed to evaluate the lower extremity deep venous systems from the level of the common femoral vein and including the common femoral, femoral, profunda femoral, popliteal and calf veins including the posterior tibial, peroneal and gastrocnemius veins when visible. The superficial great saphenous vein was also interrogated. Spectral Doppler was utilized to evaluate flow at rest and with distal augmentation maneuvers in the common femoral, femoral and popliteal veins. COMPARISON:  April 20, 2021 FINDINGS: RIGHT LOWER EXTREMITY Common Femoral Vein: No evidence of thrombus. Normal compressibility, respiratory phasicity and response to augmentation. Saphenofemoral Junction: No evidence of thrombus. Normal compressibility and flow on color Doppler imaging. Profunda Femoral Vein: No evidence of thrombus. Normal compressibility and flow on color Doppler imaging. Femoral Vein: No evidence of thrombus. Normal compressibility, respiratory phasicity and response to augmentation. Popliteal Vein: No evidence of thrombus. Normal compressibility, respiratory phasicity and response to augmentation. Calf Veins: No evidence of thrombus. Normal compressibility and flow on color Doppler imaging. Superficial Great Saphenous Vein: No evidence of thrombus. Normal compressibility. Venous Reflux:  None. Other Findings:  None. LEFT LOWER EXTREMITY Common Femoral Vein: No evidence of thrombus. Normal compressibility, respiratory phasicity and response to augmentation. Saphenofemoral Junction: No evidence of thrombus. Normal compressibility and flow on color Doppler imaging. Profunda Femoral Vein: No evidence of thrombus. Normal compressibility and flow on color Doppler imaging. Femoral Vein: No evidence of thrombus. Normal compressibility, respiratory phasicity and response to  augmentation. Popliteal Vein: No evidence of thrombus. Normal compressibility, respiratory phasicity and response to augmentation. Calf Veins: No evidence of thrombus. Normal compressibility and flow on color Doppler imaging. Superficial Great Saphenous Vein: No evidence of thrombus. Normal compressibility. Venous Reflux:  None. Other Findings:  None. IMPRESSION: No evidence of deep venous thrombosis in either  lower extremity. Electronically Signed   By: Aram Candela M.D.   On: 08/21/2023 00:15   CT Angio Chest Pulmonary Embolism (PE) W or WO Contrast  Result Date: 08/20/2023 CLINICAL DATA:  Pulmonary embolism (PE) suspected, high prob EXAM: CT ANGIOGRAPHY CHEST WITH CONTRAST TECHNIQUE: Multidetector CT imaging of the chest was performed using the standard protocol during bolus administration of intravenous contrast. Multiplanar CT image reconstructions and MIPs were obtained to evaluate the vascular anatomy. RADIATION DOSE REDUCTION: This exam was performed according to the departmental dose-optimization program which includes automated exposure control, adjustment of the mA and/or kV according to patient size and/or use of iterative reconstruction technique. CONTRAST:  75mL OMNIPAQUE IOHEXOL 350 MG/ML SOLN COMPARISON:  Chest x-ray 08/20/2023, CT angio chest 04/19/2023. FINDINGS: Cardiovascular: Fair opacification of the pulmonary arteries to the segmental level. No evidence of pulmonary central or segmental embolism. Limited evaluation of the subsegmental level due to motion artifact and timing of contrast. Normal heart size. No significant pericardial effusion. The thoracic aorta is normal in caliber. Mild-to-moderate atherosclerotic plaque of the thoracic aorta. No coronary artery calcifications. Aortic valve leaflet and mitral annular calcifications. Mediastinum/Nodes: Left axillary lymph node dissection. No enlarged axillary lymph nodes. Slightly more conspicuous enlarged right hilar lymph node  measuring 1.2 cm. No left hilar lymphadenopathy. Slightly more conspicuous enlarged mediastinal lymph nodes with a 1 cm prevascular and 1.1 cm right paratracheal lymph node. Thyroid gland, trachea, and esophagus demonstrate no significant findings. Lungs/Pleura: Mild centrilobular emphysematous changes. Chronic peripheral reticulation and cystic changes. No focal consolidation. No pulmonary nodule. No pulmonary mass. No pleural effusion. No pneumothorax. Upper Abdomen: No acute abnormality. Fluid density lesions within bilateral kidneys likely represent simple renal cysts. Simple renal cysts, in the absence of clinically indicated signs/symptoms, require no independent follow-up. Musculoskeletal: No chest wall abnormality. No suspicious lytic or blastic osseous lesions. No acute displaced fracture. Multilevel degenerative changes of the spine. Review of the MIP images confirms the above findings. IMPRESSION: 1. No central or segmental pulmonary embolus with markedly limited evaluation more distally due to timing of contrast and motion artifact. 2. No acute intrathoracic abnormality. 3. Slightly more conspicuous mediastinal and right hilar lymphadenopathy. Unclear etiology. Recommend attention on follow-up. 4. Chronic pulmonary fibrosis/interstitial lung disease changes. 5. Emphysema (ICD10-J43.9). 6. Aortic Atherosclerosis (ICD10-I70.0) including mitral annular and aortic valve leaflet calcifications-correlate for aortic stenosis. Electronically Signed   By: Tish Frederickson M.D.   On: 08/20/2023 23:29   CT HEAD WO CONTRAST ( )  Result Date: 08/20/2023 CLINICAL DATA:  Head trauma, moderate-severe; Neck trauma (Age >= 65y) EXAM: CT HEAD WITHOUT CONTRAST CT CERVICAL SPINE WITHOUT CONTRAST TECHNIQUE: Multidetector CT imaging of the head and cervical spine was performed following the standard protocol without intravenous contrast. Multiplanar CT image reconstructions of the cervical spine were also generated.  RADIATION DOSE REDUCTION: This exam was performed according to the departmental dose-optimization program which includes automated exposure control, adjustment of the mA and/or kV according to patient size and/or use of iterative reconstruction technique. COMPARISON:  CT Head 02/25/23 CT c spine 02/25/23 FINDINGS: CT HEAD FINDINGS Brain: No hemorrhage. No hydrocephalus. No extra-axial fluid collection. No CT evidence of an acute cortical infarct. No mass effect. No mass lesion. Vascular: No hyperdense vessel or unexpected calcification. Skull: Normal. Negative for fracture or focal lesion. Sinuses/Orbits: No middle ear or mastoid effusion. Paranasal sinuses are clear. Orbits are unremarkable. Other: None. CT CERVICAL SPINE FINDINGS Alignment: Straightening of the normal cervical lordosis. Skull base and vertebrae: No acute fracture.  No primary bone lesion or focal pathologic process. Soft tissues and spinal canal: No prevertebral fluid or swelling. No visible canal hematoma. Disc levels:  No CT evidence of high-grade spinal canal stenosis. Upper chest: Negative. Other: No IMPRESSION: 1. No CT evidence of intracranial injury. 2. No acute fracture or traumatic subluxation of the cervical spine. Electronically Signed   By: Lorenza Cambridge M.D.   On: 08/20/2023 19:03   CT Cervical Spine Wo Contrast  Result Date: 08/20/2023 CLINICAL DATA:  Head trauma, moderate-severe; Neck trauma (Age >= 65y) EXAM: CT HEAD WITHOUT CONTRAST CT CERVICAL SPINE WITHOUT CONTRAST TECHNIQUE: Multidetector CT imaging of the head and cervical spine was performed following the standard protocol without intravenous contrast. Multiplanar CT image reconstructions of the cervical spine were also generated. RADIATION DOSE REDUCTION: This exam was performed according to the departmental dose-optimization program which includes automated exposure control, adjustment of the mA and/or kV according to patient size and/or use of iterative reconstruction  technique. COMPARISON:  CT Head 02/25/23 CT c spine 02/25/23 FINDINGS: CT HEAD FINDINGS Brain: No hemorrhage. No hydrocephalus. No extra-axial fluid collection. No CT evidence of an acute cortical infarct. No mass effect. No mass lesion. Vascular: No hyperdense vessel or unexpected calcification. Skull: Normal. Negative for fracture or focal lesion. Sinuses/Orbits: No middle ear or mastoid effusion. Paranasal sinuses are clear. Orbits are unremarkable. Other: None. CT CERVICAL SPINE FINDINGS Alignment: Straightening of the normal cervical lordosis. Skull base and vertebrae: No acute fracture. No primary bone lesion or focal pathologic process. Soft tissues and spinal canal: No prevertebral fluid or swelling. No visible canal hematoma. Disc levels:  No CT evidence of high-grade spinal canal stenosis. Upper chest: Negative. Other: No IMPRESSION: 1. No CT evidence of intracranial injury. 2. No acute fracture or traumatic subluxation of the cervical spine. Electronically Signed   By: Lorenza Cambridge M.D.   On: 08/20/2023 19:03   DG Chest Portable 1 View  Result Date: 08/20/2023 CLINICAL DATA:  Cough and weakness EXAM: PORTABLE CHEST 1 VIEW COMPARISON:  X-ray 06/18/2023 and older. FINDINGS: Underinflation. Diffuse interstitial changes are identified, slightly more pronounced today than previous. Elevation of the right hemidiaphragm. Slightly more opacity at the bases. No pneumothorax. Enlarged cardiopericardial silhouette. Tortuous ectatic aorta. Surgical clips in the left axillary region. IMPRESSION: Slight increase in lung base opacities. Subtle infiltrate is possible. Recommend follow-up. Underlying fibrotic, interstitial changes again seen of the lungs. Please correlate with the history. Elevation of the right hemidiaphragm Electronically Signed   By: Karen Kays M.D.   On: 08/20/2023 17:05      Impression / Plan:   DOTTIE SANTALUCIA is a 82 y.o. female with history of COPD, A-fib on Eliquis, diabetes with history  of chronic iron deficiency anemia presents with acute on chronic symptomatic iron deficiency anemia without evidence of active GI bleed.  Patient responded appropriately to blood transfusion  Recommend EGD and colonoscopy with possible video capsule endoscopy Switch to clear liquids today N.p.o. effective 5 AM tomorrow Bowel prep ordered Recommend parenteral iron on while inpatient, oral iron as outpatient  I have discussed alternative options, risks & benefits,  which include, but are not limited to, bleeding, infection, perforation,respiratory complication & drug reaction.  The patient agrees with this plan & written consent will be obtained.     Thank you for involving me in the care of this patient.      LOS: 0 days   Lannette Donath, MD  08/21/2023, 4:22 PM    Note: This  dictation was prepared with Dragon dictation along with smaller phrase technology. Any transcriptional errors that result from this process are unintentional.

## 2023-08-21 NOTE — ED Notes (Signed)
Pt eating breakfast 

## 2023-08-21 NOTE — ED Notes (Signed)
Dr Lucianne Muss at bedside. Pt has breakfast tray. Gave morning meds. Missing dulera inhaler. Waiting on blood to be ready. Pt is alert and oriented.

## 2023-08-21 NOTE — ED Notes (Signed)
Nurse secretary put transport in for room 7.

## 2023-08-21 NOTE — Progress Notes (Signed)
[facility] Progress Note  Patient: Tracy Dickerson    NIO:[id number]  DOA: 08/20/2023     Date of Service: the patient was seen and examined on 08/21/2023  Chief Complaint  Patient presents with   Near Syncope   Brief hospital course: Tracy Dickerson is a 82 y.o. female with medical history significant of hypertension, hyperlipidemia, diabetes mellitus, GERD, breast cancer, CKD-3a, septic shock due to UTI, PAF on Eliquis, who presents with generalized weakness, syncope, SOB and cough.   Pt states that over the last 2 weeks, she has had progressive worsening generalized weakness. She has dizziness and lightheadedness. She passed out twice today.  No unilateral numbness or tinglings in extremities.  No facial droop or slurred speech.  She has dry cough and shortness of breath.  She had some chest pain in the morning, which has resolved.  No fever or chills.  No nausea, vomiting, diarrhea or abdominal pain.  No symptoms of UTI.  Patient states that she ran out of her medications for more than 2 weeks.  She did not take her medications in the past 2 weeks, including Eliquis.  Per ED RN, pt was found to bed bugs.     Data reviewed independently and ED Course: pt was found to have WBC 7.7, trop 12, D-dimer 1.85, BNP 233, stable renal function, potassium 3.1, negative COVID PCR, temperature normal, blood pressure 110/59, heart rate 68, RR 20, oxygen saturation 95% on room air.  CT of the head and neck is negative for acute injury.  Patient is placed on telemetry bed for observation.   Chest x-ray: Slight increase in lung base opacities. Subtle infiltrate is possible. Recommend follow-up.   Underlying fibrotic, interstitial changes again seen of the lungs. Please correlate with the history. Elevation of the right hemidiaphragm     EKG: I have personally reviewed.  Sinus rhythm, QTc 454, frequent PAC    Assessment and Plan:  # Syncope: Etiology is not clear.  Patient reports shortness of  breath and had some chest pain earlier, D-dimer +1.85, will need to rule out PE.  Other differential diagnosis include orthostatic status and vasovagal syncope.  No focal neurodeficit on physical examination.  CT head negative.  Low suspicions for stroke. - Orthostatic vital signs  - CTA negative for PE, and LE doppler negative for DVT - Neuro checks  - IVF: 1L of NS - PT/OT eval and treat  # Symptomatic anemia Hb 6.3 Transfuse 1 unit of PRBC Started PPI 40 IV BID Discontinued Eliquis Follow anemia and hemolysis w/up Check FOBT F/u GI     Hypokalemia: Potassium 3.1.  Magnesium 2.3, phosphorus 2.7 -Repleted potassium   HTN (hypertension): Blood pressure 110/59. -Hold lisinopril due to softer blood pressure -IV hydralazine as needed   COPD (chronic obstructive pulmonary disease)  Patient reports dry cough, shortness of breath, no wheezing on auscultation.  Does not seem to have COPD exacerbation.  Chest x-ray showed some basilar opacity, but the patient does not have fever or leukocytosis.  Procalcitonin < 0.10.  Clinically does not seem to have pneumonia.  Patient received 1 dose of Rocephin and azithromycin in ED, will discontinue antibiotics. -Bronchodilators -As needed Mucinex   PAF (paroxysmal atrial fibrillation) (HCC): HR 68 -Hold Eliquis due to low Hb, resume when stable    CKD stage 3a, GFR 45-59 ml/min [facility]): Renal function stable.  Baseline creatinine 1.04 in 08/01/2023.  Her creatinine is 1.03, BUN 11, GFR 55 -Follow-up with BMP  Type II diabetes mellitus with renal manifestations [facility]): Recent A1c 5.9, well-controlled.  Patient seem to be not taking medications. -Check blood sugar every morning   Infestation by bed bug -As needed hydroxyzine for itchy   Body mass index is 32.28 kg/m.  Interventions:  Diet: Heart healthy/carb modified diet DVT Prophylaxis: SCD, pharmacological prophylaxis contraindicated due to Low Hb    Advance goals of care  discussion: Full code  Family Communication: family was not present at bedside, at the time of interview.  The pt provided permission to discuss medical plan with the family. Opportunity was given to ask question and all questions were answered satisfactorily.   Disposition:  Pt is from Home, admitted with syncope, found to have low hemoglobin 6.3, still has risk of, needs GI workup, which precludes a safe discharge. Discharge to home, when stable, may need few days to improve.  Subjective: No significant events overnight, patient is sleeping little bit dizzy, no chest pain or shortness patient denies any GI bleeding, no dark stools.  Denied any nausea vomiting, no abdominal pain.  Physical Exam: General: NAD, lying comfortably Appear in no distress, affect appropriate Eyes: PERRLA ENT: Oral Mucosa Clear, moist  Neck: no JVD,  Cardiovascular: S1 and S2 Present, no Murmur,  Respiratory: good respiratory effort, Bilateral Air entry equal and Decreased, no Crackles, no wheezes Abdomen: Bowel Sound present, Soft and no tenderness,  Skin: no rashes Extremities: no Pedal edema, no calf tenderness Neurologic: without any new focal findings, AAOx 2 person & Place (DOB  knows Date and month, but forgot year) Gait not checked due to patient safety concerns  Vitals:   08/21/23 0930 08/21/23 1005 08/21/23 1006 08/21/23 1028  BP: 121/61  117/82 128/64  Pulse: 72  89 93  Resp: (!) 21  17 15   Temp:  97.9 F (36.6 C) 97.9 F (36.6 C) 97.9 F (36.6 C)  TempSrc:  Oral Oral Oral  SpO2: 93%  100% 99%  Weight:      Height:        Intake/Output Summary (Last 24 hours) at 08/21/2023 1145 Last data filed at 08/21/2023 0419 Gross per 24 hour  Intake 250 ml  Output --  Net 250 ml   Filed Weights   08/20/23 1319  Weight: 90.7 kg    Data Reviewed: I have personally reviewed and interpreted daily labs, tele strips, imagings as discussed above. I reviewed all nursing notes, pharmacy notes,  vitals, pertinent old records I have discussed plan of care as described above with RN and patient/family.  CBC: Recent Labs  Lab 08/20/23 1321 08/21/23 0528  WBC 7.7 7.5  HGB 8.1* 6.3*  HCT 26.3* 22.1*  MCV 85.9 83.7  PLT 254 205   Basic Metabolic Panel: Recent Labs  Lab 08/20/23 1321 08/21/23 0528  NA 140 140  K 3.1* 4.0  CL 107 109  CO2 24 25  GLUCOSE 133* 89  BUN 11 11  CREATININE 1.03* 1.00  CALCIUM 8.6* 8.0*  MG 2.3  --   PHOS 2.7  --     Studies: US Venous Img Lower Bilateral (DVT)  Result Date: 08/21/2023 CLINICAL DATA:  Positive D-dimer. EXAM: BILATERAL LOWER EXTREMITY VENOUS DOPPLER ULTRASOUND TECHNIQUE: Gray-scale sonography with graded compression, as well as color Doppler and duplex ultrasound were performed to evaluate the lower extremity deep venous systems from the level of the common femoral vein and including the common femoral, femoral, profunda femoral, popliteal and calf veins including the posterior tibial, peroneal and gastrocnemius  veins when visible. The superficial great saphenous vein was also interrogated. Spectral Doppler was utilized to evaluate flow at rest and with distal augmentation maneuvers in the common femoral, femoral and popliteal veins. COMPARISON:  April 20, 2021 FINDINGS: RIGHT LOWER EXTREMITY Common Femoral Vein: No evidence of thrombus. Normal compressibility, respiratory phasicity and response to augmentation. Saphenofemoral Junction: No evidence of thrombus. Normal compressibility and flow on color Doppler imaging. Profunda Femoral Vein: No evidence of thrombus. Normal compressibility and flow on color Doppler imaging. Femoral Vein: No evidence of thrombus. Normal compressibility, respiratory phasicity and response to augmentation. Popliteal Vein: No evidence of thrombus. Normal compressibility, respiratory phasicity and response to augmentation. Calf Veins: No evidence of thrombus. Normal compressibility and flow on color Doppler  imaging. Superficial Great Saphenous Vein: No evidence of thrombus. Normal compressibility. Venous Reflux:  None. Other Findings:  None. LEFT LOWER EXTREMITY Common Femoral Vein: No evidence of thrombus. Normal compressibility, respiratory phasicity and response to augmentation. Saphenofemoral Junction: No evidence of thrombus. Normal compressibility and flow on color Doppler imaging. Profunda Femoral Vein: No evidence of thrombus. Normal compressibility and flow on color Doppler imaging. Femoral Vein: No evidence of thrombus. Normal compressibility, respiratory phasicity and response to augmentation. Popliteal Vein: No evidence of thrombus. Normal compressibility, respiratory phasicity and response to augmentation. Calf Veins: No evidence of thrombus. Normal compressibility and flow on color Doppler imaging. Superficial Great Saphenous Vein: No evidence of thrombus. Normal compressibility. Venous Reflux:  None. Other Findings:  None. IMPRESSION: No evidence of deep venous thrombosis in either lower extremity. Electronically Signed   By: Aram Candela M.D.   On: 08/21/2023 00:15   CT Angio Chest Pulmonary Embolism (PE) W or WO Contrast  Result Date: 08/20/2023 CLINICAL DATA:  Pulmonary embolism (PE) suspected, high prob EXAM: CT ANGIOGRAPHY CHEST WITH CONTRAST TECHNIQUE: Multidetector CT imaging of the chest was performed using the standard protocol during bolus administration of intravenous contrast. Multiplanar CT image reconstructions and MIPs were obtained to evaluate the vascular anatomy. RADIATION DOSE REDUCTION: This exam was performed according to the departmental dose-optimization program which includes automated exposure control, adjustment of the mA and/or kV according to patient size and/or use of iterative reconstruction technique. CONTRAST:  75mL OMNIPAQUE IOHEXOL 350 MG/ML SOLN COMPARISON:  Chest x-ray 08/20/2023, CT angio chest 04/19/2023. FINDINGS: Cardiovascular: Fair opacification of the  pulmonary arteries to the segmental level. No evidence of pulmonary central or segmental embolism. Limited evaluation of the subsegmental level due to motion artifact and timing of contrast. Normal heart size. No significant pericardial effusion. The thoracic aorta is normal in caliber. Mild-to-moderate atherosclerotic plaque of the thoracic aorta. No coronary artery calcifications. Aortic valve leaflet and mitral annular calcifications. Mediastinum/Nodes: Left axillary lymph node dissection. No enlarged axillary lymph nodes. Slightly more conspicuous enlarged right hilar lymph node measuring 1.2 cm. No left hilar lymphadenopathy. Slightly more conspicuous enlarged mediastinal lymph nodes with a 1 cm prevascular and 1.1 cm right paratracheal lymph node. Thyroid gland, trachea, and esophagus demonstrate no significant findings. Lungs/Pleura: Mild centrilobular emphysematous changes. Chronic peripheral reticulation and cystic changes. No focal consolidation. No pulmonary nodule. No pulmonary mass. No pleural effusion. No pneumothorax. Upper Abdomen: No acute abnormality. Fluid density lesions within bilateral kidneys likely represent simple renal cysts. Simple renal cysts, in the absence of clinically indicated signs/symptoms, require no independent follow-up. Musculoskeletal: No chest wall abnormality. No suspicious lytic or blastic osseous lesions. No acute displaced fracture. Multilevel degenerative changes of the spine. Review of the MIP images confirms the above findings.  IMPRESSION: 1. No central or segmental pulmonary embolus with markedly limited evaluation more distally due to timing of contrast and motion artifact. 2. No acute intrathoracic abnormality. 3. Slightly more conspicuous mediastinal and right hilar lymphadenopathy. Unclear etiology. Recommend attention on follow-up. 4. Chronic pulmonary fibrosis/interstitial lung disease changes. 5. Emphysema (ICD10-J43.9). 6. Aortic Atherosclerosis (ICD10-I70.0)  including mitral annular and aortic valve leaflet calcifications-correlate for aortic stenosis. Electronically Signed   By: Tish Frederickson M.D.   On: 08/20/2023 23:29   CT HEAD WO CONTRAST ( )  Result Date: 08/20/2023 CLINICAL DATA:  Head trauma, moderate-severe; Neck trauma (Age >= 65y) EXAM: CT HEAD WITHOUT CONTRAST CT CERVICAL SPINE WITHOUT CONTRAST TECHNIQUE: Multidetector CT imaging of the head and cervical spine was performed following the standard protocol without intravenous contrast. Multiplanar CT image reconstructions of the cervical spine were also generated. RADIATION DOSE REDUCTION: This exam was performed according to the departmental dose-optimization program which includes automated exposure control, adjustment of the mA and/or kV according to patient size and/or use of iterative reconstruction technique. COMPARISON:  CT Head 02/25/23 CT c spine 02/25/23 FINDINGS: CT HEAD FINDINGS Brain: No hemorrhage. No hydrocephalus. No extra-axial fluid collection. No CT evidence of an acute cortical infarct. No mass effect. No mass lesion. Vascular: No hyperdense vessel or unexpected calcification. Skull: Normal. Negative for fracture or focal lesion. Sinuses/Orbits: No middle ear or mastoid effusion. Paranasal sinuses are clear. Orbits are unremarkable. Other: None. CT CERVICAL SPINE FINDINGS Alignment: Straightening of the normal cervical lordosis. Skull base and vertebrae: No acute fracture. No primary bone lesion or focal pathologic process. Soft tissues and spinal canal: No prevertebral fluid or swelling. No visible canal hematoma. Disc levels:  No CT evidence of high-grade spinal canal stenosis. Upper chest: Negative. Other: No IMPRESSION: 1. No CT evidence of intracranial injury. 2. No acute fracture or traumatic subluxation of the cervical spine. Electronically Signed   By: Lorenza Cambridge M.D.   On: 08/20/2023 19:03   CT Cervical Spine Wo Contrast  Result Date: 08/20/2023 CLINICAL DATA:  Head  trauma, moderate-severe; Neck trauma (Age >= 65y) EXAM: CT HEAD WITHOUT CONTRAST CT CERVICAL SPINE WITHOUT CONTRAST TECHNIQUE: Multidetector CT imaging of the head and cervical spine was performed following the standard protocol without intravenous contrast. Multiplanar CT image reconstructions of the cervical spine were also generated. RADIATION DOSE REDUCTION: This exam was performed according to the departmental dose-optimization program which includes automated exposure control, adjustment of the mA and/or kV according to patient size and/or use of iterative reconstruction technique. COMPARISON:  CT Head 02/25/23 CT c spine 02/25/23 FINDINGS: CT HEAD FINDINGS Brain: No hemorrhage. No hydrocephalus. No extra-axial fluid collection. No CT evidence of an acute cortical infarct. No mass effect. No mass lesion. Vascular: No hyperdense vessel or unexpected calcification. Skull: Normal. Negative for fracture or focal lesion. Sinuses/Orbits: No middle ear or mastoid effusion. Paranasal sinuses are clear. Orbits are unremarkable. Other: None. CT CERVICAL SPINE FINDINGS Alignment: Straightening of the normal cervical lordosis. Skull base and vertebrae: No acute fracture. No primary bone lesion or focal pathologic process. Soft tissues and spinal canal: No prevertebral fluid or swelling. No visible canal hematoma. Disc levels:  No CT evidence of high-grade spinal canal stenosis. Upper chest: Negative. Other: No IMPRESSION: 1. No CT evidence of intracranial injury. 2. No acute fracture or traumatic subluxation of the cervical spine. Electronically Signed   By: Lorenza Cambridge M.D.   On: 08/20/2023 19:03   DG Chest Portable 1 View  Result Date: 08/20/2023 CLINICAL DATA:  Cough and weakness EXAM: PORTABLE CHEST 1 VIEW COMPARISON:  X-ray 06/18/2023 and older. FINDINGS: Underinflation. Diffuse interstitial changes are identified, slightly more pronounced today than previous. Elevation of the right hemidiaphragm. Slightly more  opacity at the bases. No pneumothorax. Enlarged cardiopericardial silhouette. Tortuous ectatic aorta. Surgical clips in the left axillary region. IMPRESSION: Slight increase in lung base opacities. Subtle infiltrate is possible. Recommend follow-up. Underlying fibrotic, interstitial changes again seen of the lungs. Please correlate with the history. Elevation of the right hemidiaphragm Electronically Signed   By: Karen Kays M.D.   On: 08/20/2023 17:05    Scheduled Meds:  atorvastatin  20 mg Oral Daily   bisacodyl  10 mg Oral QHS   cyanocobalamin  1,000 mcg Oral Daily   folic acid  1 mg Oral Daily   gabapentin  600 mg Oral QHS   ipratropium-albuterol  3 mL Nebulization Q6H   mometasone-formoterol  2 puff Inhalation BID   pantoprazole (PROTONIX) IV  40 mg Intravenous Q12H   polyethylene glycol  17 g Oral BID   Continuous Infusions: PRN Meds: acetaminophen, albuterol, bisacodyl, dextromethorphan-guaiFENesin, hydrALAZINE, hydrOXYzine, meclizine, ondansetron (ZOFRAN) IV, traMADol  Time spent: 55 minutes  Author: Gillis Santa. MD [facility] 08/21/2023 11:45 AM  To reach On-call, see care teams to locate the attending and reach out to them via [email or web address]. If 7PM-7AM, please contact night-coverage If you still have difficulty reaching the attending provider, please page the [facility] (Director on Call) for [facility] on amion for assistance.

## 2023-08-21 NOTE — ED Notes (Signed)
Advised nurse that patient has ready bed 

## 2023-08-21 NOTE — Plan of Care (Addendum)
Patient admitted from ED today, Patient is alert and oriented X 2.she has been getting a breathing treatment as order. Denies any pain.   Problem: Education: Goal: Knowledge of General Education information will improve Description: Including pain rating scale, medication(s)/side effects and non-pharmacologic comfort measures Outcome: Progressing   Problem: Health Behavior/Discharge Planning: Goal: Ability to manage health-related needs will improve Outcome: Progressing   Problem: Clinical Measurements: Goal: Ability to maintain clinical measurements within normal limits will improve Outcome: Progressing Goal: Will remain free from infection Outcome: Progressing Goal: Diagnostic test results will improve Outcome: Progressing Goal: Respiratory complications will improve Outcome: Progressing Goal: Cardiovascular complication will be avoided Outcome: Progressing   Problem: Activity: Goal: Risk for activity intolerance will decrease Outcome: Progressing   Problem: Nutrition: Goal: Adequate nutrition will be maintained Outcome: Progressing   Problem: Coping: Goal: Level of anxiety will decrease Outcome: Progressing   Problem: Elimination: Goal: Will not experience complications related to bowel motility Outcome: Progressing Goal: Will not experience complications related to urinary retention Outcome: Progressing   Problem: Pain Management: Goal: General experience of comfort will improve Outcome: Progressing   Problem: Safety: Goal: Ability to remain free from injury will improve Outcome: Progressing   Problem: Skin Integrity: Goal: Risk for impaired skin integrity will decrease Outcome: Progressing   Problem: Education: Goal: Knowledge of disease or condition will improve Outcome: Progressing Goal: Knowledge of the prescribed therapeutic regimen will improve Outcome: Progressing Goal: Individualized Educational Video(s) Outcome: Progressing   Problem:  Activity: Goal: Ability to tolerate increased activity will improve Outcome: Progressing Goal: Will verbalize the importance of balancing activity with adequate rest periods Outcome: Progressing   Problem: Respiratory: Goal: Ability to maintain a clear airway will improve Outcome: Progressing Goal: Levels of oxygenation will improve Outcome: Progressing Goal: Ability to maintain adequate ventilation will improve Outcome: Progressing   Problem: Activity: Goal: Ability to tolerate increased activity will improve Outcome: Progressing   Problem: Clinical Measurements: Goal: Ability to maintain a body temperature in the normal range will improve Outcome: Progressing   Problem: Respiratory: Goal: Ability to maintain adequate ventilation will improve Outcome: Progressing Goal: Ability to maintain a clear airway will improve Outcome: Progressing

## 2023-08-21 NOTE — Progress Notes (Signed)
PT Cancellation Note  Patient Details Name: Tracy Dickerson MRN: [id number] DOB: 10/16/1940   Cancelled Treatment:    Reason Eval/Treat Not Completed: Patient not medically ready.  PT consult received.  Chart reviewed.  Pt's Hgb noted to be down-trending from 8.1 (11/27) to 6.3 this morning (11/28).  D/t low Hgb, will hold PT at this time and re-attempt PT evaluation at a later date/time as medically appropriate.  Hendricks Limes, PT 08/21/23, 8:06 AM

## 2023-08-21 NOTE — ED Notes (Signed)
Called pharmacy to retime duoneb which was given late last time.

## 2023-08-21 NOTE — ED Notes (Signed)
Paper blood consent signed. E signature pad not working.

## 2023-08-21 NOTE — ED Notes (Signed)
Pt walked with cane to toilet.

## 2023-08-22 ENCOUNTER — Encounter
Admission: EM | Disposition: A | Discharge status: Skilled Nursing Facility | Payer: Self-pay | Source: Home / Self Care | Attending: Internal Medicine

## 2023-08-22 DIAGNOSIS — D509 Iron deficiency anemia, unspecified: Secondary | ICD-10-CM | POA: Diagnosis not present

## 2023-08-22 DIAGNOSIS — R55 Syncope and collapse: Secondary | ICD-10-CM | POA: Diagnosis not present

## 2023-08-22 DIAGNOSIS — J449 Chronic obstructive pulmonary disease, unspecified: Secondary | ICD-10-CM | POA: Diagnosis not present

## 2023-08-22 DIAGNOSIS — D500 Iron deficiency anemia secondary to blood loss (chronic): Secondary | ICD-10-CM

## 2023-08-22 DIAGNOSIS — E876 Hypokalemia: Secondary | ICD-10-CM | POA: Diagnosis not present

## 2023-08-22 DIAGNOSIS — D649 Anemia, unspecified: Secondary | ICD-10-CM | POA: Diagnosis not present

## 2023-08-22 LAB — TYPE AND SCREEN
ABO/RH(D): O POS
Antibody Screen: NEGATIVE
Unit division: 0

## 2023-08-22 LAB — BASIC METABOLIC PANEL
Anion gap: 7 (ref 5–15)
BUN: 12 mg/dL (ref 8–23)
CO2: 25 mmol/L (ref 22–32)
Calcium: 8.4 mg/dL — ABNORMAL LOW (ref 8.9–10.3)
Chloride: 109 mmol/L (ref 98–111)
Creatinine, Ser: 1.24 mg/dL — ABNORMAL HIGH (ref 0.44–1.00)
GFR, Estimated: 44 mL/min — ABNORMAL LOW (ref >60)
Glucose, Bld: 86 mg/dL (ref 70–99)
Potassium: 4 mmol/L (ref 3.5–5.1)
Sodium: 141 mmol/L (ref 135–145)

## 2023-08-22 LAB — PHOSPHORUS: Phosphorus: 3.3 mg/dL (ref 2.5–4.6)

## 2023-08-22 LAB — CBC
HCT: 23.1 % — ABNORMAL LOW (ref 36.0–46.0)
Hemoglobin: 7.9 g/dL — ABNORMAL LOW (ref 12.0–15.0)
MCH: 29 pg (ref 26.0–34.0)
MCHC: 34.2 g/dL (ref 30.0–36.0)
MCV: 84.9 fL (ref 80.0–100.0)
Platelets: 201 10*3/uL (ref 150–400)
RBC: 2.72 MIL/uL — ABNORMAL LOW (ref 3.87–5.11)
RDW: 16.6 % — ABNORMAL HIGH (ref 11.5–15.5)
WBC: 7.4 10*3/uL (ref 4.0–10.5)
nRBC: 0 % (ref 0.0–0.2)

## 2023-08-22 LAB — BPAM RBC
Blood Product Expiration Date: 202412202359
ISSUE DATE / TIME: 202411281008
Unit Type and Rh: 5100

## 2023-08-22 LAB — MAGNESIUM: Magnesium: 2.3 mg/dL (ref 1.7–2.4)

## 2023-08-22 LAB — GLUCOSE, CAPILLARY: Glucose-Capillary: 78 mg/dL (ref 70–99)

## 2023-08-22 SURGERY — ESOPHAGOGASTRODUODENOSCOPY (EGD) WITH PROPOFOL
Anesthesia: General

## 2023-08-22 MED ORDER — VITAMIN D (ERGOCALCIFEROL) 1.25 MG (50000 UNIT) PO CAPS
50000.0000 [IU] | ORAL_CAPSULE | ORAL | Status: DC
  Administered 2023-08-22 – 2023-08-29 (×2): 50000 [IU] via ORAL
  Filled 2023-08-22 (×2): qty 1

## 2023-08-22 MED ORDER — SODIUM CHLORIDE 0.9 % IV BOLUS
500.0000 mL | Freq: Once | INTRAVENOUS | Status: AC
  Administered 2023-08-22: 500 mL via INTRAVENOUS

## 2023-08-22 MED ORDER — POLYETHYLENE GLYCOL 3350 17 GM/SCOOP PO POWD
1.0000 | Freq: Once | ORAL | Status: AC
  Administered 2023-08-22: 255 g via ORAL
  Filled 2023-08-22: qty 255

## 2023-08-22 MED ORDER — SODIUM CHLORIDE 0.9 % IV SOLN: INTRAVENOUS | Status: DC

## 2023-08-22 NOTE — Evaluation (Signed)
Physical Therapy Evaluation Patient Details Name: Tracy Dickerson MRN: [id number] DOB: 11/22/1940 Today's Date: 08/22/2023  History of Present Illness  Pt is an 82 y.o. female presenting to hospital 08/20/23 with c/o generalized weakness and fall.  Pt found to have bed bugs.  Pt admitted with syncope, hypokalemia, and sympomatic anemia.  PMH includes htn, HLD, DM, breast CA, L breast surgery, L tib/fib ORIF, CKD, septic shock d/t UTI, PAF.  Clinical Impression  Prior to recent medical concerns, pt was modified independent ambulating with SPC within home and rollator outside of home; and lives with her brother.  Currently pt is SBA with bed mobility; min assist with transfers; and min assist to side step to R along bed a couple feet (limited activity d/t pt's c/o significant dizziness in standing).  Took orthostatics during session:  Supine BP 130/57 with HR 81 bpm; sitting BP 122/73 with HR 96 bpm (mild dizziness reported); standing BP (at 0 minutes) 100/55 with HR 94 bpm; and standing BP (at 3 minutes) 113/59 with HR 85 bpm. Pt reporting being very dizzy in standing and then c/o 7/10 HA--nurse notified of pt's request for pain medication.  Pt's MD and nurse notified of pt's symptoms and above noted vitals.  Pt assisted back to bed and pt reporting significant improvement in dizziness.  Pt would currently benefit from skilled PT to address noted impairments and functional limitations (see below for any additional details).  Upon hospital discharge, pt would benefit from ongoing therapy.     If plan is discharge home, recommend the following: A little help with walking and/or transfers;A little help with bathing/dressing/bathroom;Assistance with cooking/housework;Assist for transportation;Help with stairs or ramp for entrance   Can travel by private vehicle        Equipment Recommendations Rolling walker (2 wheels);BSC/3in1  Recommendations for Other Services       Functional Status Assessment  Patient has had a recent decline in their functional status and demonstrates the ability to make significant improvements in function in a reasonable and predictable amount of time.     Precautions / Restrictions Precautions Precautions: Fall;Other (comment) Precaution Comments: Bed bugs noted in ED Restrictions Weight Bearing Restrictions: No      Mobility  Bed Mobility Overal bed mobility: Needs Assistance Bed Mobility: Supine to Sit, Sit to Supine     Supine to sit: Supervision, Used rails, HOB elevated Sit to supine: Supervision, HOB elevated   General bed mobility comments: increased effort to perform on own    Transfers Overall transfer level: Needs assistance Equipment used: 1 person hand held assist Transfers: Sit to/from Stand Sit to Stand: Min assist           General transfer comment: vc's for UE placement; assist to steady when standing    Ambulation/Gait Ambulation/Gait assistance: Min assist Gait Distance (Feet):  (side stepped to R a couple feet along bed) Assistive device: 1 person hand held assist         General Gait Details: assist to steady; limited d/t dizziness  Stairs            Wheelchair Mobility     Tilt Bed    Modified Rankin (Stroke Patients Only)       Balance Overall balance assessment: Needs assistance Sitting-balance support: No upper extremity supported, Feet supported Sitting balance-Leahy Scale: Good Sitting balance - Comments: steady reaching within BOS   Standing balance support: Single extremity supported Standing balance-Leahy Scale: Poor Standing balance comment: assist to steady in standing (  pt declined to use RW or SPC)                             Pertinent Vitals/Pain Pain Assessment Pain Assessment: 0-10 Pain Score: 7  Pain Location: headache Pain Descriptors / Indicators: Headache Pain Intervention(s): Limited activity within patient's tolerance, Monitored during session,  Repositioned, Patient requesting pain meds-RN notified    Home Living Family/patient expects to be discharged to:: Private residence Living Arrangements: Other relatives (Pt's brother) Available Help at Discharge: Family;Available 24 hours/day Type of Home: House Home Access: Stairs to enter Entrance Stairs-Rails: Right Entrance Stairs-Number of Steps: 3 (from back)   Home Layout: One level Home Equipment: Cane - single Librarian, academic (2 wheels);Rollator (4 wheels)      Prior Function Prior Level of Function : History of Falls (last six months);Independent/Modified Independent             Mobility Comments: Ambulatory with SPC in home; uses rollator outside of home ADLs Comments: Independent with ADL's; brother assists with cooking, cleaning, yardwork; uses [employer] for stores and appts     Extremity/Trunk Assessment   Upper Extremity Assessment Upper Extremity Assessment: Overall WFL for tasks assessed    Lower Extremity Assessment Lower Extremity Assessment: Generalized weakness    Cervical / Trunk Assessment Cervical / Trunk Assessment: Other exceptions Cervical / Trunk Exceptions: forward head/shoulders  Communication   Communication Communication: Hearing impairment (HOH) Cueing Techniques: Verbal cues;Visual cues  Cognition Arousal: Alert Behavior During Therapy: WFL for tasks assessed/performed Overall Cognitive Status: No family/caregiver present to determine baseline cognitive functioning                                 General Comments: A&O to person, hospital, and general situation        General Comments General comments (skin integrity, edema, etc.): Dizziness reported with OOB mobility.  Nursing cleared pt for participation in physical therapy.  Pt agreeable to PT session.    Exercises     Assessment/Plan    PT Assessment Patient needs continued PT services  PT Problem List Decreased strength;Decreased activity  tolerance;Decreased balance;Decreased mobility;Cardiopulmonary status limiting activity;Pain       PT Treatment Interventions DME instruction;Gait training;Stair training;Functional mobility training;Therapeutic activities;Therapeutic exercise;Balance training;Patient/family education    PT Goals (Current goals can be found in the Care Plan section)  Acute Rehab PT Goals Patient Stated Goal: to improve strength and walking PT Goal Formulation: With patient Time For Goal Achievement: 09/05/23 Potential to Achieve Goals: Good    Frequency Min 1X/week     Co-evaluation               AM-PAC PT "6 Clicks" Mobility  Outcome Measure Help needed turning from your back to your side while in a flat bed without using bedrails?: None Help needed moving from lying on your back to sitting on the side of a flat bed without using bedrails?: A Little Help needed moving to and from a bed to a chair (including a wheelchair)?: A Little Help needed standing up from a chair using your arms (e.g., wheelchair or bedside chair)?: A Little Help needed to walk in hospital room?: A Little Help needed climbing 3-5 steps with a railing? : A Lot 6 Click Score: 18    End of Session   Activity Tolerance: Other (comment) (limited d/t dizziness in standing) Patient left: in bed;with  call bell/phone within reach;with bed alarm set Nurse Communication: Mobility status;Precautions;Other (comment);Patient requests pain meds (Pt's vitals and symptoms during session) PT Visit Diagnosis: Unsteadiness on feet (R26.81);Other abnormalities of gait and mobility (R26.89);Muscle weakness (generalized) (M62.81);History of falling (Z91.81)    Time: 1610-9604 PT Time Calculation (min) (ACUTE ONLY): 20 min   Charges:   PT Evaluation $PT Eval Low Complexity: 1 Low PT Treatments $Therapeutic Activity: 8-22 mins PT General Charges $$ ACUTE PT VISIT: 1 Visit        Hendricks Limes, PT 08/22/23, 3:25 PM

## 2023-08-22 NOTE — [place] FL2 (Signed)
[place] MEDICAID FL2 LEVEL OF CARE FORM     IDENTIFICATION  Patient Name: Tracy Dickerson Birthdate: 11/23/1940 Sex: female Admission Date (Current Location): 08/20/2023  [place] and [place] Number:  Chiropodist and Address:  [facility], [address] [place], [place] [address]      Provider Number: [id number]  Attending Physician Name and Address:  Merlene Laughter, DO  Relative Name and Phone Number:  Lonia Chimera [facility])  [phone] [facility])    Current Level of Care: Hospital Recommended Level of Care: Skilled Nursing Facility Prior Approval Number:    Date Approved/Denied:   PASRR Number: [id number]  Discharge Plan:      Current Diagnoses: Patient Active Problem List   Diagnosis Date Noted   Iron deficiency anemia due to chronic blood loss 08/21/2023   Syncope 08/20/2023   Normocytic anemia 08/20/2023   Obesity (BMI 30-39.9) 08/20/2023   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/2023   B12 deficiency 06/20/2023   Carotid stenosis 06/20/2023   Dementia (HCC) 06/19/2023   Generalized weakness 06/18/2023   Acute on chronic anemia 06/18/2023   H/O: upper GI bleed 06/18/2023   Chronic anticoagulation 06/18/2023   Infestation by bed bug 06/18/2023   CKD stage 3a, GFR 45-59 ml/min (HCC) 01/07/2023   Overweight (BMI 25.0-29.9) 01/06/2023   Type 2 diabetes mellitus with hyperlipidemia (HCC) 01/06/2023   COPD (chronic obstructive pulmonary disease) (HCC) 01/06/2023   Hypokalemia 01/06/2023   UGIB (upper gastrointestinal bleed) 01/05/2023   Unsatisfactory living conditions 01/05/2023   Constipation    UTI (urinary tract infection) 08/13/2020   HLD (hyperlipidemia) 08/13/2020   Acute renal failure superimposed on stage 3a chronic kidney disease (HCC) 08/13/2020   PAF (paroxysmal atrial fibrillation) (HCC) 08/13/2020   HTN (hypertension) 08/13/2020   Dehydration    Dehydration, moderate     Acute renal failure (HCC) 04/22/2020   Hyperkalemia    Septic shock (HCC)    Sepsis due to urinary tract infection (HCC) 03/13/2018   Acute kidney injury superimposed on chronic kidney disease (HCC) 02/13/2018   Closed extra-articular fracture of distal tibia 02/02/2018   Pneumonia 10/29/2017   Hypotension 01/26/2017   Lung nodule 05/30/2014   Sleep apnea 05/10/2014   Barrett's esophagus 05/13/2007   Malignant neoplasm of breast (female) (HCC) 06/01/1997    Orientation RESPIRATION BLADDER Height & Weight     Self, Time, Situation, Place  Normal   Weight: 200 lb (90.7 kg) Height:  5\' 6"  (167.6 cm)  BEHAVIORAL SYMPTOMS/MOOD NEUROLOGICAL BOWEL NUTRITION STATUS      Continent Diet  AMBULATORY STATUS COMMUNICATION OF NEEDS Skin   Limited Assist Verbally Normal                       Personal Care Assistance Level of Assistance  Bathing, Feeding, Dressing Bathing Assistance: Limited assistance Feeding assistance: Limited assistance Dressing Assistance: Limited assistance     Functional Limitations Info             SPECIAL CARE FACTORS FREQUENCY  PT (By licensed PT), OT (By licensed OT)     PT Frequency: 5 times per week OT Frequency: 5 times per week            Contractures      Additional Factors Info  Code Status, Allergies Code Status Info: full Allergies Info: Oxycodone, Percocet (Oxycodone-acetaminophen), Penicillins           Current Medications (08/22/2023):  This is  the current hospital active medication list Current Facility-Administered Medications  Medication Dose Route Frequency Provider Last Rate Last Admin   0.9 %  sodium chloride infusion   Intravenous Continuous Toney Reil, MD 20 mL/hr at 08/21/23 2220 New Bag at 08/21/23 2220   acetaminophen (TYLENOL) tablet 650 mg  650 mg Oral Q6H PRN Lorretta Harp, MD   650 mg at 08/22/23 1144   albuterol (PROVENTIL) (2.5 MG/3ML) 0.083% nebulizer solution 2.5 mg  2.5 mg Inhalation Q4H PRN Lorretta Harp, MD       atorvastatin (LIPITOR) tablet 20 mg  20 mg Oral Daily Lorretta Harp, MD   20 mg at 08/22/23 4098   bisacodyl (DULCOLAX) EC tablet 10 mg  10 mg Oral QHS Gillis Santa, MD   10 mg at 08/21/23 2209   bisacodyl (DULCOLAX) suppository 10 mg  10 mg Rectal Daily PRN Gillis Santa, MD       cyanocobalamin (VITAMIN B12) tablet 1,000 mcg  1,000 mcg Oral Daily Lorretta Harp, MD   1,000 mcg at 08/22/23 1191   dextromethorphan-guaiFENesin (MUCINEX DM) 30-600 MG per 12 hr tablet 1 tablet  1 tablet Oral BID PRN Lorretta Harp, MD       folic acid (FOLVITE) tablet 1 mg  1 mg Oral Daily Gillis Santa, MD   1 mg at 08/22/23 4782   gabapentin (NEURONTIN) capsule 600 mg  600 mg Oral QHS Lorretta Harp, MD   600 mg at 08/21/23 2209   hydrALAZINE (APRESOLINE) injection 5 mg  5 mg Intravenous Q2H PRN Lorretta Harp, MD       hydrOXYzine (ATARAX) tablet 10 mg  10 mg Oral TID PRN Lorretta Harp, MD       ipratropium-albuterol (DUONEB) 0.5-2.5 (3) MG/3ML nebulizer solution 3 mL  3 mL Nebulization BID Gillis Santa, MD   3 mL at 08/22/23 9562   meclizine (ANTIVERT) tablet 12.5 mg  12.5 mg Oral TID PRN Lorretta Harp, MD       mometasone-formoterol [facility]) 200-5 MCG/ACT inhaler 2 puff  2 puff Inhalation BID Lorretta Harp, MD   2 puff at 08/22/23 0758   ondansetron (ZOFRAN) injection 4 mg  4 mg Intravenous Q8H PRN Lorretta Harp, MD       pantoprazole (PROTONIX) injection 40 mg  40 mg Intravenous Q12H Gillis Santa, MD   40 mg at 08/22/23 0928   polyethylene glycol (MIRALAX / GLYCOLAX) packet 17 g  17 g Oral BID Gillis Santa, MD   17 g at 08/22/23 0811   traMADol (ULTRAM) tablet 50 mg  50 mg Oral BID PRN Lorretta Harp, MD   50 mg at 08/21/23 0120     Discharge Medications: Please see discharge summary for a list of discharge medications.  Relevant Imaging Results:  Relevant Lab Results:   Additional Information SS#: [id number]  Shade Rivenbark E Dereon Corkery, LCSW

## 2023-08-22 NOTE — Progress Notes (Signed)
PROGRESS NOTE    DOROTHYANN LEATHERMAN  ZHY:[id number] DOB: [birth year] DOA: 08/20/2023 PCP: Dwana Melena, PA   Brief Narrative:  HPI per Dr. Lorretta Harp on 08/20/23  SAYDI DESHMUKH is a 82 y.o. female with medical history significant of hypertension, hyperlipidemia, diabetes mellitus, GERD, breast cancer, CKD-3a, septic shock due to UTI, PAF on Eliquis, who presents with generalized weakness, syncope, SOB and cough.   Pt states that over the last 2 weeks, she has had progressive worsening generalized weakness. She has dizziness and lightheadedness. She passed out twice today.  No unilateral numbness or tinglings in extremities.  No facial droop or slurred speech.  She has dry cough and shortness of breath.  She had some chest pain in the morning, which has resolved.  No fever or chills.  No nausea, vomiting, diarrhea or abdominal pain.  No symptoms of UTI.  Patient states that she ran out of her medications for more than 2 weeks.  She did not take her medications in the past 2 weeks, including Eliquis.  Per ED RN, pt was found to bed bugs.     Data reviewed independently and ED Course: pt was found to have WBC 7.7, trop 12, D-dimer 1.85, BNP 233, stable renal function, potassium 3.1, negative COVID PCR, temperature normal, blood pressure 110/59, heart rate 68, RR 20, oxygen saturation 95% on room air.  CT of the head and neck is negative for acute injury.  Patient is placed on telemetry bed for observation.   Chest x-ray: Slight increase in lung base opacities. Subtle infiltrate is possible. Recommend follow-up.   Underlying fibrotic, interstitial changes again seen of the lungs. Please correlate with the history. Elevation of the right hemidiaphragm   **Interim History Since Eliquis has been held given her drop in hemoglobin.  She was typed and screened and transfused 1 unit PRBCs.  Continues to be significant orthostatic.  GI is planning for an EGD and colonoscopy with possible video capsule  endoscopy as well.  Assessment and Plan:  Syncope -Etiology is not clear but likely in the setting of Anemia.  -Patient reports shortness of breath and had some chest pain earlier, D-dimer +1.85, will need to rule out PE.   -Other differential diagnosis include orthostatic status and vasovagal syncope.   -No focal neurodeficit on physical examination.   -CT head negative.  Low suspicions for stroke. -Orthostatic vital signs Positive -CTA negative for PE, and LE doppler negative for DVT -Neuro checks  -IVF: 1/2 Liter of NS and now maintenance IVF x 1 day -PT/OT eval and treat and recommending SNF   Orthostatic Hypotension -In the setting of Anemia and ? Bleeding -Give 500 mL bolus and restart Maintenance IVF with NS @ 40 mL/hr -TED hose -PT/OT to evaluate and treat   HTN (Hypertension) -Blood pressure 110/59. -Hold Lisinopril due to softer blood pressure -IV hydralazine as needed -Continue to Monitor BP per Protocol 128/59   COPD (chronic obstructive pulmonary disease)  -Patient reports dry cough, shortness of breath, no wheezing on auscultation.   -Does not seem to have COPD exacerbation.   -Chest x-ray showed some basilar opacity, but the patient does not have fever or leukocytosis. Procalcitonin < 0.10.  -Clinically does not seem to have pneumonia.   -Patient received 1 dose of Rocephin and azithromycin in ED, will discontinue antibiotics. SpO2: 96 % -C/w Bronchodilators -As needed Mucinex  Vitamin D Deficiency -Vitamin D, 25-Hydroxy was 17.29 -Start Supplementation with Vitamin D 50,000 units weekly  PAF (paroxysmal atrial fibrillation) (HCC): HR 68 -Continue to Hold Eliquis due to low Hb -Resume when stable and when ok with GI   Type II diabetes mellitus with renal manifestations (HCC):  -Recent A1c 5.9, well-controlled.  Patient seem to be not taking medications. -Check blood sugar every morning -CBG Trend: Recent Labs  Lab 08/21/23 0853 08/22/23 0759  GLUCAP  82 78   Symptomatic Normocytic Anemia/ ? 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 -Hgb/Hct Trend: Recent Labs  Lab 08/11/23 1625 08/20/23 1321 08/21/23 0528 08/21/23 1608 08/22/23 0358  HGB 8.1* 8.1* 6.3* 8.3* 7.9*  HCT 23.6* 26.3* 22.1* 26.3* 23.1*  MCV 90.4 85.9 83.7  --  84.9  -Iron Panel was checked and iron level 16, UIBC of 349, TIBC of 365, saturation ratios of 4%, folate level 7.1 and vitamin B12 level 205 -Continuing IV PPI with Pantoprazole 40 mg BID -Check FOBT and was Negative  -GI Recommending Parenteral Iron while Inpatient and oral Iron supplementation as an outpatient  -Hold Anticoagulation currently -Continue to Monitor for S/Sx of Bleeding -GI Planning on EGD and Colonoscopy with possible video capsule endoscopy but patient did not finish bowel prep so plan is to reschedule procedures in the AM  CKD Stage 3a -Relatively stable. BUN/Cr Trend: Recent Labs  Lab 08/11/23 1625 08/20/23 1321 08/21/23 0528 08/22/23 0358  BUN 13 11 11 12   CREATININE 1.04* 1.03* 1.00 1.24*  -Will give her a 500 mL bolus and start maintenance IVF with NS at 40 mL/hr x1 Day -Avoid Nephrotoxic Medications, Contrast Dyes, Hypotension and Dehydration to Ensure Adequate Renal Perfusion and will need to Renally Adjust Meds -Continue to Monitor and Trend Renal Function carefully and repeat CMP in the AM   Hypokalemia -Patient's K+ Level Trend: Recent Labs  Lab 08/11/23 1625 08/20/23 1321 08/21/23 0528 08/22/23 0358  K 3.6 3.1* 4.0 4.0  -Continue to Monitor and Replete as Necessary -Repeat CMP in the AM   Hypoalbuminemia -Patient's Albumin Trend: Recent Labs  Lab 08/11/23 1625 08/21/23 0839  ALBUMIN 3.6 2.7*  -Continue to Monitor and Trend and repeat CMP in the AM  Obesity -Complicates overall prognosis and care -Estimated body mass index is 32.28 kg/m as calculated from the following:   Height as of this encounter: 5\' 6"  (1.676 m).   Weight as of this  encounter: 90.7 kg.  -Weight Loss and Dietary Counseling given   DVT prophylaxis: Place TED hose Start: 08/22/23 1555 Place and maintain sequential compression device Start: 08/21/23 1155    Code Status: Full Code Family Communication: Family currently at bedside  Disposition Plan:  Level of care: Telemetry Medical Status is: Observation The patient will require care spanning > 2 midnights and should be moved to inpatient because: Needs further clinical workup and clearance by the GI physician and planning for an EGD and colonoscopy in a.m.   Consultants:  Gastroenterology  Procedures:  As delineated as above  Antimicrobials:  Anti-infectives (From admission, onward)    Start     Dose/Rate Route Frequency Ordered Stop   08/20/23 1715  cefTRIAXone (ROCEPHIN) 2 g in sodium chloride 0.9 % 100 mL IVPB        2 g 200 mL/hr over 30 Minutes Intravenous  Once 08/20/23 1710 08/20/23 2144   08/20/23 1715  azithromycin (ZITHROMAX) 500 mg in sodium chloride 0.9 % 250 mL IVPB        500 mg 250 mL/hr over 60 Minutes Intravenous  Once 08/20/23 1710 08/21/23 0419  Subjective: Seen and examined at bedside the patient was doing okay.  Still has not finished much appropriate.  No nausea or vomiting.  Denies any abdominal discomfort and has not noticed any blood.  No other concerns or complaints this time.  Objective: Vitals:   08/22/23 0449 08/22/23 0757 08/22/23 1420 08/22/23 1606  BP: (!) 154/57 (!) 150/65  (!) 128/59  Pulse: 76 87  75  Resp: 20 16  16   Temp: 98 F (36.7 C) 97.8 F (36.6 C)  97.8 F (36.6 C)  TempSrc:    Oral  SpO2: 98% 98% 98% 96%  Weight:      Height:       No intake or output data in the 24 hours ending 08/22/23 1750 Filed Weights   08/20/23 1319  Weight: 90.7 kg   Examination: Physical Exam:  Constitutional: WN/WD obese Caucasian female no acute distress Respiratory: Diminished to auscultation bilaterally, no wheezing, rales, rhonchi or crackles.  Normal respiratory effort and patient is not tachypenic. No accessory muscle use.  Unlabored breathing Cardiovascular: RRR, no murmurs / rubs / gallops. S1 and S2 auscultated. No extremity edema. Abdomen: Soft, non-tender, distended secondary body habitus. Bowel sounds positive.  GU: Deferred. Musculoskeletal: No clubbing / cyanosis of digits/nails. No joint deformity upper and lower extremities.  Skin: No rashes, lesions, ulcers on a limited skin evaluation. No induration; Warm and dry.  Neurologic: CN 2-12 grossly intact with no focal deficits. Romberg sign and cerebellar reflexes not assessed.  Psychiatric: Normal judgment and insight. Alert and oriented x 3. Normal mood and appropriate affect.   Data Reviewed: I have personally reviewed following labs and imaging studies  CBC: Recent Labs  Lab 08/20/23 1321 08/21/23 0528 08/21/23 1608 08/22/23 0358  WBC 7.7 7.5  --  7.4  HGB 8.1* 6.3* 8.3* 7.9*  HCT 26.3* 22.1* 26.3* 23.1*  MCV 85.9 83.7  --  84.9  PLT 254 205  --  201   Basic Metabolic Panel: Recent Labs  Lab 08/20/23 1321 08/21/23 0528 08/22/23 0358  NA 140 140 141  K 3.1* 4.0 4.0  CL 107 109 109  CO2 24 25 25   GLUCOSE 133* 89 86  BUN 11 11 12   CREATININE 1.03* 1.00 1.24*  CALCIUM 8.6* 8.0* 8.4*  MG 2.3  --  2.3  PHOS 2.7  --  3.3   GFR: Estimated Creatinine Clearance: 41.1 mL/min (A) (by C-G formula based on SCr of 1.24 mg/dL (H)). Liver Function Tests: Recent Labs  Lab 08/21/23 0839  AST 16  ALT 8  ALKPHOS 61  BILITOT 0.4  PROT 5.9*  ALBUMIN 2.7*   No results for input(s): "LIPASE", "AMYLASE" in the last 168 hours. No results for input(s): "AMMONIA" in the last 168 hours. Coagulation Profile: No results for input(s): "INR", "PROTIME" in the last 168 hours. Cardiac Enzymes: Recent Labs  Lab 08/21/23 0839  CKTOTAL 20*   BNP (last 3 results) No results for input(s): "PROBNP" in the last 8760 hours. HbA1C: No results for input(s): "HGBA1C" in the  last 72 hours. CBG: Recent Labs  Lab 08/21/23 0853 08/22/23 0759  GLUCAP 82 78   Lipid Profile: No results for input(s): "CHOL", "HDL", "LDLCALC", "TRIG", "CHOLHDL", "LDLDIRECT" in the last 72 hours. Thyroid Function Tests: No results for input(s): "TSH", "T4TOTAL", "FREET4", "T3FREE", "THYROIDAB" in the last 72 hours. Anemia Panel: Recent Labs    08/21/23 0500 08/21/23 0528 08/21/23 0839 08/21/23 1301  VITAMINB12  --   --  205  --  FOLATE  --  7.1  --   --   FERRITIN  --   --   --  9*  TIBC  --  365  --   --   IRON  --  16*  --   --   RETICCTPCT 2.2  --   --   --    Sepsis Labs: Recent Labs  Lab 08/20/23 1321 08/20/23 1909 08/20/23 2348  PROCALCITON <0.10  --   --   LATICACIDVEN  --  1.1 0.8    Recent Results (from the past 240 hour(s))  Blood culture (routine x 2)     Status: None (Preliminary result)   Collection Time: 08/20/23  5:09 PM   Specimen: BLOOD  Result Value Ref Range Status   Specimen Description BLOOD BLOOD RIGHT FOREARM  Final   Special Requests   Final    BOTTLES DRAWN AEROBIC AND ANAEROBIC Blood Culture results may not be optimal due to an inadequate volume of blood received in culture bottles   Culture   Final    NO GROWTH 2 DAYS Performed at [facility], [address] [place], [place] [address]    Report Status PENDING  Incomplete  SARS Coronavirus 2 by RT PCR (hospital order, performed in [facility] hospital lab) *cepheid single result test* Anterior Nasal Swab     Status: None   Collection Time: 08/20/23  5:09 PM   Specimen: Anterior Nasal Swab  Result Value Ref Range Status   SARS Coronavirus 2 by RT PCR NEGATIVE NEGATIVE Final    Comment: (NOTE) SARS-CoV-2 target nucleic acids are NOT DETECTED.  The SARS-CoV-2 RNA is generally detectable in upper and lower respiratory specimens during the acute phase of infection. The lowest concentration of SARS-CoV-2 viral copies this assay can detect is 250 copies / mL. A negative  result does not preclude SARS-CoV-2 infection and should not be used as the sole basis for treatment or other patient management decisions.  A negative result may occur with improper specimen collection / handling, submission of specimen other than nasopharyngeal swab, presence of viral mutation(s) within the areas targeted by this assay, and inadequate number of viral copies (<250 copies / mL). A negative result must be combined with clinical observations, patient history, and epidemiological information.  Fact Sheet for Patients:   RoadLapTop.co.za  Fact Sheet for Healthcare Providers: http://kim-miller.com/  This test is not yet approved or  cleared by the [place] FDA and has been authorized for detection and/or diagnosis of SARS-CoV-2 by FDA under an Emergency Use Authorization (EUA).  This EUA will remain in effect (meaning this test can be used) for the duration of the COVID-19 declaration under Section 564(b)(1) of the Act, 21 U.S.C. section 360bbb-3(b)(1), unless the authorization is terminated or revoked sooner.  Performed at [facility], [address] [place], [place] [address]   Blood culture (routine x 2)     Status: None (Preliminary result)   Collection Time: 08/20/23  7:09 PM   Specimen: BLOOD  Result Value Ref Range Status   Specimen Description BLOOD LEFT ANTECUBITAL  Final   Special Requests   Final    BOTTLES DRAWN AEROBIC AND ANAEROBIC Blood Culture adequate volume   Culture   Final    NO GROWTH 2 DAYS Performed at [facility], [address], [place], [place] [address]    Report Status PENDING  Incomplete  Expectorated Sputum Assessment w Gram Stain, Rflx to Resp Cult  Status: None   Collection Time: 08/20/23 11:48 PM   Specimen: Sputum  Result Value Ref Range Status   Specimen Description SPUTUM  Final   Special Requests NONE  Final   Sputum evaluation   Final    THIS  SPECIMEN IS ACCEPTABLE FOR SPUTUM CULTURE Performed at [facility], [address], [place], [place] [address]    Report Status 08/21/2023 FINAL  Final  Culture, Respiratory w Gram Stain     Status: None (Preliminary result)   Collection Time: 08/20/23 11:48 PM   Specimen: SPU  Result Value Ref Range Status   Specimen Description   Final    SPUTUM Performed at [facility], [address] [place], [place] [address]    Special Requests   Final    NONE Reflexed from [id number] Performed at [facility], [address] [place], [place] [address]    Gram Stain   Final    FEW SQUAMOUS EPITHELIAL CELLS PRESENT NO WBC SEEN RARE GRAM POSITIVE COCCI    Culture   Final    CULTURE REINCUBATED FOR BETTER GROWTH Performed at [facility], [address], [place], [place] [address]    Report Status PENDING  Incomplete    Radiology Studies: US Venous Img Lower Bilateral (DVT)  Result Date: 08/21/2023 CLINICAL DATA:  Positive D-dimer. EXAM: BILATERAL LOWER EXTREMITY VENOUS DOPPLER ULTRASOUND TECHNIQUE: Gray-scale sonography with graded compression, as well as color Doppler and duplex ultrasound were performed to evaluate the lower extremity deep venous systems from the level of the common femoral vein and including the common femoral, femoral, profunda femoral, popliteal and calf veins including the posterior tibial, peroneal and gastrocnemius veins when visible. The superficial great saphenous vein was also interrogated. Spectral Doppler was utilized to evaluate flow at rest and with distal augmentation maneuvers in the common femoral, femoral and popliteal veins. COMPARISON:  April 20, 2021 FINDINGS: RIGHT LOWER EXTREMITY Common Femoral Vein: No evidence of thrombus. Normal compressibility, respiratory phasicity and response to augmentation. Saphenofemoral Junction: No evidence of thrombus. Normal compressibility and flow on color Doppler imaging. Profunda Femoral  Vein: No evidence of thrombus. Normal compressibility and flow on color Doppler imaging. Femoral Vein: No evidence of thrombus. Normal compressibility, respiratory phasicity and response to augmentation. Popliteal Vein: No evidence of thrombus. Normal compressibility, respiratory phasicity and response to augmentation. Calf Veins: No evidence of thrombus. Normal compressibility and flow on color Doppler imaging. Superficial Great Saphenous Vein: No evidence of thrombus. Normal compressibility. Venous Reflux:  None. Other Findings:  None. LEFT LOWER EXTREMITY Common Femoral Vein: No evidence of thrombus. Normal compressibility, respiratory phasicity and response to augmentation. Saphenofemoral Junction: No evidence of thrombus. Normal compressibility and flow on color Doppler imaging. Profunda Femoral Vein: No evidence of thrombus. Normal compressibility and flow on color Doppler imaging. Femoral Vein: No evidence of thrombus. Normal compressibility, respiratory phasicity and response to augmentation. Popliteal Vein: No evidence of thrombus. Normal compressibility, respiratory phasicity and response to augmentation. Calf Veins: No evidence of thrombus. Normal compressibility and flow on color Doppler imaging. Superficial Great Saphenous Vein: No evidence of thrombus. Normal compressibility. Venous Reflux:  None. Other Findings:  None. IMPRESSION: No evidence of deep venous thrombosis in either lower extremity. Electronically Signed   By: Aram Candela M.D.   On: 08/21/2023 00:15   CT Angio Chest Pulmonary Embolism (PE) W or WO Contrast  Result Date: 08/20/2023 CLINICAL DATA:  Pulmonary embolism (PE) suspected, high prob EXAM: CT ANGIOGRAPHY CHEST WITH CONTRAST  TECHNIQUE: Multidetector CT imaging of the chest was performed using the standard protocol during bolus administration of intravenous contrast. Multiplanar CT image reconstructions and MIPs were obtained to evaluate the vascular anatomy. RADIATION DOSE  REDUCTION: This exam was performed according to the departmental dose-optimization program which includes automated exposure control, adjustment of the mA and/or kV according to patient size and/or use of iterative reconstruction technique. CONTRAST:  75mL OMNIPAQUE IOHEXOL 350 MG/ML SOLN COMPARISON:  Chest x-ray 08/20/2023, CT angio chest 04/19/2023. FINDINGS: Cardiovascular: Fair opacification of the pulmonary arteries to the segmental level. No evidence of pulmonary central or segmental embolism. Limited evaluation of the subsegmental level due to motion artifact and timing of contrast. Normal heart size. No significant pericardial effusion. The thoracic aorta is normal in caliber. Mild-to-moderate atherosclerotic plaque of the thoracic aorta. No coronary artery calcifications. Aortic valve leaflet and mitral annular calcifications. Mediastinum/Nodes: Left axillary lymph node dissection. No enlarged axillary lymph nodes. Slightly more conspicuous enlarged right hilar lymph node measuring 1.2 cm. No left hilar lymphadenopathy. Slightly more conspicuous enlarged mediastinal lymph nodes with a 1 cm prevascular and 1.1 cm right paratracheal lymph node. Thyroid gland, trachea, and esophagus demonstrate no significant findings. Lungs/Pleura: Mild centrilobular emphysematous changes. Chronic peripheral reticulation and cystic changes. No focal consolidation. No pulmonary nodule. No pulmonary mass. No pleural effusion. No pneumothorax. Upper Abdomen: No acute abnormality. Fluid density lesions within bilateral kidneys likely represent simple renal cysts. Simple renal cysts, in the absence of clinically indicated signs/symptoms, require no independent follow-up. Musculoskeletal: No chest wall abnormality. No suspicious lytic or blastic osseous lesions. No acute displaced fracture. Multilevel degenerative changes of the spine. Review of the MIP images confirms the above findings. IMPRESSION: 1. No central or segmental  pulmonary embolus with markedly limited evaluation more distally due to timing of contrast and motion artifact. 2. No acute intrathoracic abnormality. 3. Slightly more conspicuous mediastinal and right hilar lymphadenopathy. Unclear etiology. Recommend attention on follow-up. 4. Chronic pulmonary fibrosis/interstitial lung disease changes. 5. Emphysema (ICD10-J43.9). 6. Aortic Atherosclerosis (ICD10-I70.0) including mitral annular and aortic valve leaflet calcifications-correlate for aortic stenosis. Electronically Signed   By: Tish Frederickson M.D.   On: 08/20/2023 23:29   CT HEAD WO CONTRAST ( )  Result Date: 08/20/2023 CLINICAL DATA:  Head trauma, moderate-severe; Neck trauma (Age >= 65y) EXAM: CT HEAD WITHOUT CONTRAST CT CERVICAL SPINE WITHOUT CONTRAST TECHNIQUE: Multidetector CT imaging of the head and cervical spine was performed following the standard protocol without intravenous contrast. Multiplanar CT image reconstructions of the cervical spine were also generated. RADIATION DOSE REDUCTION: This exam was performed according to the departmental dose-optimization program which includes automated exposure control, adjustment of the mA and/or kV according to patient size and/or use of iterative reconstruction technique. COMPARISON:  CT Head 02/25/23 CT c spine 02/25/23 FINDINGS: CT HEAD FINDINGS Brain: No hemorrhage. No hydrocephalus. No extra-axial fluid collection. No CT evidence of an acute cortical infarct. No mass effect. No mass lesion. Vascular: No hyperdense vessel or unexpected calcification. Skull: Normal. Negative for fracture or focal lesion. Sinuses/Orbits: No middle ear or mastoid effusion. Paranasal sinuses are clear. Orbits are unremarkable. Other: None. CT CERVICAL SPINE FINDINGS Alignment: Straightening of the normal cervical lordosis. Skull base and vertebrae: No acute fracture. No primary bone lesion or focal pathologic process. Soft tissues and spinal canal: No prevertebral fluid or  swelling. No visible canal hematoma. Disc levels:  No CT evidence of high-grade spinal canal stenosis. Upper chest: Negative. Other: No IMPRESSION: 1. No CT evidence of intracranial injury.  2. No acute fracture or traumatic subluxation of the cervical spine. Electronically Signed   By: Lorenza Cambridge M.D.   On: 08/20/2023 19:03   CT Cervical Spine Wo Contrast  Result Date: 08/20/2023 CLINICAL DATA:  Head trauma, moderate-severe; Neck trauma (Age >= 65y) EXAM: CT HEAD WITHOUT CONTRAST CT CERVICAL SPINE WITHOUT CONTRAST TECHNIQUE: Multidetector CT imaging of the head and cervical spine was performed following the standard protocol without intravenous contrast. Multiplanar CT image reconstructions of the cervical spine were also generated. RADIATION DOSE REDUCTION: This exam was performed according to the departmental dose-optimization program which includes automated exposure control, adjustment of the mA and/or kV according to patient size and/or use of iterative reconstruction technique. COMPARISON:  CT Head 02/25/23 CT c spine 02/25/23 FINDINGS: CT HEAD FINDINGS Brain: No hemorrhage. No hydrocephalus. No extra-axial fluid collection. No CT evidence of an acute cortical infarct. No mass effect. No mass lesion. Vascular: No hyperdense vessel or unexpected calcification. Skull: Normal. Negative for fracture or focal lesion. Sinuses/Orbits: No middle ear or mastoid effusion. Paranasal sinuses are clear. Orbits are unremarkable. Other: None. CT CERVICAL SPINE FINDINGS Alignment: Straightening of the normal cervical lordosis. Skull base and vertebrae: No acute fracture. No primary bone lesion or focal pathologic process. Soft tissues and spinal canal: No prevertebral fluid or swelling. No visible canal hematoma. Disc levels:  No CT evidence of high-grade spinal canal stenosis. Upper chest: Negative. Other: No IMPRESSION: 1. No CT evidence of intracranial injury. 2. No acute fracture or traumatic subluxation of the  cervical spine. Electronically Signed   By: Lorenza Cambridge M.D.   On: 08/20/2023 19:03    Scheduled Meds:  atorvastatin  20 mg Oral Daily   bisacodyl  10 mg Oral QHS   cyanocobalamin  1,000 mcg Oral Daily   folic acid  1 mg Oral Daily   gabapentin  600 mg Oral QHS   mometasone-formoterol  2 puff Inhalation BID   pantoprazole (PROTONIX) IV  40 mg Intravenous Q12H   polyethylene glycol  17 g Oral BID   Vitamin D (Ergocalciferol)  50,000 Units Oral Q7 days   Continuous Infusions:  sodium chloride 20 mL/hr at 08/21/23 2220   sodium chloride      LOS: 0 days   Marguerita Merles, DO [facility] Available via Epic secure chat 7am-7pm After these hours, please refer to coverage provider listed on [email or web address] 08/22/2023, 5:50 PM

## 2023-08-22 NOTE — Hospital Course (Addendum)
HPI per Dr. Lorretta Harp on 08/20/23  Tracy Dickerson is a 82 y.o. female with medical history significant of hypertension, hyperlipidemia, diabetes mellitus, GERD, breast cancer, CKD-3a, septic shock due to UTI, PAF on Eliquis, who presents with generalized weakness, syncope, SOB and cough.   Pt states that over the last 2 weeks, she has had progressive worsening generalized weakness. She has dizziness and lightheadedness. She passed out twice today.  No unilateral numbness or tinglings in extremities.  No facial droop or slurred speech.  She has dry cough and shortness of breath.  She had some chest pain in the morning, which has resolved.  No fever or chills.  No nausea, vomiting, diarrhea or abdominal pain.  No symptoms of UTI.  Patient states that she ran out of her medications for more than 2 weeks.  She did not take her medications in the past 2 weeks, including Eliquis.  Per ED RN, pt was found to bed bugs.    Data reviewed independently and ED Course: pt was found to have WBC 7.7, trop 12, D-dimer 1.85, BNP 233, stable renal function, potassium 3.1, negative COVID PCR, temperature normal, blood pressure 110/59, heart rate 68, RR 20, oxygen saturation 95% on room air.  CT of the head and neck is negative for acute injury.  Patient is placed on telemetry bed for observation.    **Interim History Since Eliquis has been held given her drop in hemoglobin.  She was typed and screened and transfused 1 unit PRBCs.  Continues to be significant orthostatic.  GI is planning for an EGD and colonoscopy with possible video capsule endoscopy and they were done.  EGD was normal and colonoscopy was a very poor prep.  She underwent a VCE and the study was incomplete and there is unclear if the capsule reached which the cecum given that there is digested material in the entire small bowel.  The study was suboptimal but there was no visible blood in the small bowel.  GI recommended resuming anticoagulation at this time  with outpatient GI follow-up and repeating VCE and following up with hematology for IV iron infusion referrals.  Repeat orthostatic vital signs but she appears to be medically stable.  Currently disposition is pending pending TOC and SNF Authorization.   12/4.  Patient feeling okay.  Offers no complaints.  Patient is orthostatic but does not drop down enough to cause symptoms. 12/5.  Patient feels okay.  TOC trying to contact family to see if they can sign her into a rehab. 12/6.  As per [facility] brother agreeable to sign her into rehab. 12/7.  Patient having headache. 12/8.  Patient likes that here in the hospital and would like to stay here.

## 2023-08-22 NOTE — Care Management Obs Status (Signed)
MEDICARE OBSERVATION STATUS NOTIFICATION   Patient Details  Name: ANIKKA BREYFOGLE MRN: [id number] Date of Birth: 02/20/1941   Medicare Observation Status Notification Given:  Yes    Arthea Nobel E Cataleyah Colborn, LCSW 08/22/2023, 9:58 AM

## 2023-08-22 NOTE — Evaluation (Signed)
Occupational Therapy Evaluation Patient Details Name: Tracy Dickerson MRN: [id number] DOB: Oct 18, 1940 Today's Date: 08/22/2023   History of Present Illness Pt is a 82 y.o. female with history of COPD, A-fib on Eliquis, diabetes with history of chronic iron deficiency anemia presents with acute on chronic symptomatic iron deficiency anemia without evidence of active GI bleed.   Clinical Impression   Pt was seen for OT evaluation this date. Prior to hospital admission, pt reports being independent with ADL, having assist from brother whom she lives with for cooking, housekeeping, and yard work. She notes she does not drive and takes [employer] to the store or to dr appointments. Pt notes she uses a [facility] for most mobility, PRN 2WW, and uses rollator for community mobility. Pt lives in a [facility] with her brother with 2 STE. Pt presents to acute OT demonstrating impaired ADL performance and functional mobility 2/2 decreased strength, balance, and safety awareness (See OT problem list for additional functional deficits). Pt currently requires supv for bed mobility, MIN A for STS with RW and VC for hand placement, and MIN A for taking a couple lateral steps EOB 2/2 poor balance and pt noting dizziness with positional changes. RN and PT notified of symptoms. Pt would benefit from skilled OT services to address noted impairments and functional limitations (see below for any additional details) in order to maximize safety and independence while minimizing falls risk and caregiver burden.     If plan is discharge home, recommend the following: A little help with walking and/or transfers;A lot of help with bathing/dressing/bathroom;Assistance with cooking/housework;Assist for transportation;Help with stairs or ramp for entrance    Functional Status Assessment  Patient has had a recent decline in their functional status and demonstrates the ability to make significant improvements in function in a reasonable and predictable  amount of time.  Equipment Recommendations  BSC/3in1    Recommendations for Other Services       Precautions / Restrictions Precautions Precautions: Fall;Other (comment) Precaution Comments: bed bugs in ED Restrictions Weight Bearing Restrictions: No      Mobility Bed Mobility Overal bed mobility: Needs Assistance Bed Mobility: Supine to Sit, Sit to Supine     Supine to sit: Supervision, Used rails Sit to supine: Supervision   General bed mobility comments: increased effort    Transfers Overall transfer level: Needs assistance Equipment used: Rolling walker (2 wheels) Transfers: Sit to/from Stand Sit to Stand: Min assist           General transfer comment: VC for hand placement      Balance Overall balance assessment: Needs assistance Sitting-balance support: No upper extremity supported, Feet supported, Single extremity supported Sitting balance-Leahy Scale: Fair     Standing balance support: Single extremity supported Standing balance-Leahy Scale: Poor Standing balance comment: Pt attempted side steps EOB prior to returning to supine 2/2 dizziness, pt required CGA to MIN A as she let go of the RW and reached for the bed rail with noted LOB requiring MODA  to correct in order to sit safely EOB.                           ADL either performed or assessed with clinical judgement   ADL Overall ADL's : Needs assistance/impaired     Grooming: Sitting;Set up               Lower Body Dressing: Sit to/from stand;Minimal assistance  Functional mobility during ADLs: Minimal assistance;Cueing for safety;Rolling walker (2 wheels);Cueing for sequencing       Vision         Perception         Praxis         Pertinent Vitals/Pain Pain Assessment Pain Assessment: No/denies pain     Extremity/Trunk Assessment Upper Extremity Assessment Upper Extremity Assessment: Overall WFL for tasks assessed   Lower Extremity  Assessment Lower Extremity Assessment: Generalized weakness       Communication Communication Communication: Hearing impairment   Cognition Arousal: Alert Behavior During Therapy: WFL for tasks assessed/performed Overall Cognitive Status: No family/caregiver present to determine baseline cognitive functioning                                 General Comments: alert, oriented x3 (not to year or name of hospital),     General Comments  Upon sitting pt endorsed mild "swimmy headedness" that resolved within 2 min and came back once she stood up, resolved again once she was back supine. RN and PT notified to consider assessing orthostatics next    Exercises     Shoulder Instructions      Home Living Family/patient expects to be discharged to:: Private residence Living Arrangements: Other relatives (brother) Available Help at Discharge: Family;Available 24 hours/day Type of Home: House Home Access: Stairs to enter [employer] of Steps: 2 Entrance Stairs-Rails: Can reach both Home Layout: One level     Bathroom Shower/Tub: Chief Strategy Officer: Standard     Home Equipment: Cane - single Librarian, academic (2 wheels);Rollator (4 wheels)          Prior Functioning/Environment               Mobility Comments: SPC for home, rollator to the store, pt denies falls ADLs Comments: indep with ADL, brother assists with cooking, cleaning, yardwork, uses [employer] for stores and appts        OT Problem List: Decreased strength;Impaired balance (sitting and/or standing);Decreased safety awareness;Decreased knowledge of use of DME or AE;Impaired UE functional use      OT Treatment/Interventions: Self-care/ADL training;Therapeutic exercise;Therapeutic activities;DME and/or AE instruction;Patient/family education;Balance training    OT Goals(Current goals can be found in the care plan section) Acute Rehab OT Goals Patient Stated Goal: go  home OT Goal Formulation: With patient Time For Goal Achievement: 09/05/23 Potential to Achieve Goals: Good ADL Goals Pt Will Perform Lower Body Dressing: with modified independence;sit to/from stand Pt Will Transfer to Toilet: with modified independence;ambulating (LRAD) Pt Will Perform Toileting - Clothing Manipulation and hygiene: with modified independence Additional ADL Goal #1: Pt will verbalize plan to implement at least 2 learned falls prevention strategies if/when pt feels "swimmy headed" to minimize falls risk.  OT Frequency: Min 1X/week    Co-evaluation              AM-PAC OT "6 Clicks" Daily Activity     Outcome Measure Help from another person eating meals?: None Help from another person taking care of personal grooming?: None Help from another person toileting, which includes using toliet, bedpan, or urinal?: A Little Help from another person bathing (including washing, rinsing, drying)?: A Little Help from another person to put on and taking off regular upper body clothing?: None Help from another person to put on and taking off regular lower body clothing?: A Little 6 Click Score: 21  End of Session Equipment Utilized During Treatment: Rolling walker (2 wheels) Nurse Communication: Mobility status  Activity Tolerance: Treatment limited secondary to medical complications (Comment) (dizziness) Patient left: in bed;with call bell/phone within reach;with bed alarm set;with nursing/sitter in room  OT Visit Diagnosis: Other abnormalities of gait and mobility (R26.89);Muscle weakness (generalized) (M62.81)                Time: 6063-0160 OT Time Calculation (min): 23 min Charges:  OT General Charges $OT Visit: 1 Visit OT Evaluation $OT Eval Low Complexity: 1 Low OT Treatments $Therapeutic Activity: 8-22 mins  Arman Filter., MPH, MS, OTR/L ascom [phone] 08/22/23, 1:32 PM

## 2023-08-22 NOTE — Progress Notes (Signed)
Tracy Repress, MD [address]  [address]  [place], [place] [address]  Main: [phone]  Fax: [phone] Pager: [phone]   Subjective: Patient was able to complete about 60% of the bowel prep, apparently, she had only 1 bowel movement.  She is working with PT when I saw her.  Denies any abdominal pain, abdominal bloating.   Objective: Vital signs in last 24 hours: Vitals:   08/22/23 0445 08/22/23 0447 08/22/23 0449 08/22/23 0757  BP: 127/76 (!) 151/74 (!) 154/57 (!) 150/65  Pulse: 85 83 76 87  Resp: 18 18 20 16   Temp: 97.8 F (36.6 C) 98 F (36.7 C) 98 F (36.7 C) 97.8 F (36.6 C)  TempSrc:      SpO2: 96% 98% 98% 98%  Weight:      Height:       Weight change:  No intake or output data in the 24 hours ending 08/22/23 1227  Exam: Heart:: Regular rate and rhythm, S1S2 present, or without murmur or extra heart sounds Lungs: normal and clear to auscultation Abdomen: soft, nontender, normal bowel sounds   Lab Results:    Latest Ref Rng & Units 08/22/2023    3:58 AM 08/21/2023    4:08 PM 08/21/2023    5:28 AM  CBC  WBC 4.0 - 10.5 K/uL 7.4   7.5   Hemoglobin 12.0 - 15.0 g/dL 7.9  8.3  6.3   Hematocrit 36.0 - 46.0 % 23.1  26.3  22.1   Platelets 150 - 400 K/uL 201   205       Latest Ref Rng & Units 08/22/2023    3:58 AM 08/21/2023    8:39 AM 08/21/2023    5:28 AM  CMP  Glucose 70 - 99 mg/dL 86   89   BUN 8 - 23 mg/dL 12   11   Creatinine 9.62 - 1.00 mg/dL 9.52   8.41   Sodium 324 - 145 mmol/L 141   140   Potassium 3.5 - 5.1 mmol/L 4.0   4.0   Chloride 98 - 111 mmol/L 109   109   CO2 22 - 32 mmol/L 25   25   Calcium 8.9 - 10.3 mg/dL 8.4   8.0   Total Protein 6.5 - 8.1 g/dL  5.9    Total Bilirubin <1.2 mg/dL  0.4    Alkaline Phos 38 - 126 U/L  61    AST 15 - 41 U/L  16    ALT 0 - 44 U/L  8      Micro Results: Recent Results (from the past 240 hour(s))  Blood culture (routine x 2)     Status: None (Preliminary result)   Collection Time:  08/20/23  5:09 PM   Specimen: BLOOD  Result Value Ref Range Status   Specimen Description BLOOD BLOOD RIGHT FOREARM  Final   Special Requests   Final    BOTTLES DRAWN AEROBIC AND ANAEROBIC Blood Culture results may not be optimal due to an inadequate volume of blood received in culture bottles   Culture   Final    NO GROWTH 2 DAYS Performed at [facility], [address] [place], [place] [address]    Report Status PENDING  Incomplete  SARS Coronavirus 2 by RT PCR (hospital order, performed in [facility] hospital lab) *cepheid single result test* Anterior Nasal Swab     Status: None   Collection Time: 08/20/23  5:09 PM   Specimen: Anterior Nasal  Swab  Result Value Ref Range Status   SARS Coronavirus 2 by RT PCR NEGATIVE NEGATIVE Final    Comment: (NOTE) SARS-CoV-2 target nucleic acids are NOT DETECTED.  The SARS-CoV-2 RNA is generally detectable in upper and lower respiratory specimens during the acute phase of infection. The lowest concentration of SARS-CoV-2 viral copies this assay can detect is 250 copies / mL. A negative result does not preclude SARS-CoV-2 infection and should not be used as the sole basis for treatment or other patient management decisions.  A negative result may occur with improper specimen collection / handling, submission of specimen other than nasopharyngeal swab, presence of viral mutation(s) within the areas targeted by this assay, and inadequate number of viral copies (<250 copies / mL). A negative result must be combined with clinical observations, patient history, and epidemiological information.  Fact Sheet for Patients:   RoadLapTop.co.za  Fact Sheet for Healthcare Providers: http://kim-miller.com/  This test is not yet approved or  cleared by the [place] FDA and has been authorized for detection and/or diagnosis of SARS-CoV-2 by FDA under an Emergency Use Authorization (EUA).  This  EUA will remain in effect (meaning this test can be used) for the duration of the COVID-19 declaration under Section 564(b)(1) of the Act, 21 U.S.C. section 360bbb-3(b)(1), unless the authorization is terminated or revoked sooner.  Performed at [facility], [address] [place], [place] [address]   Blood culture (routine x 2)     Status: None (Preliminary result)   Collection Time: 08/20/23  7:09 PM   Specimen: BLOOD  Result Value Ref Range Status   Specimen Description BLOOD LEFT ANTECUBITAL  Final   Special Requests   Final    BOTTLES DRAWN AEROBIC AND ANAEROBIC Blood Culture adequate volume   Culture   Final    NO GROWTH 2 DAYS Performed at [facility], [address], [place], [place] [address]    Report Status PENDING  Incomplete  Expectorated Sputum Assessment w Gram Stain, Rflx to Resp Cult     Status: None   Collection Time: 08/20/23 11:48 PM   Specimen: Sputum  Result Value Ref Range Status   Specimen Description SPUTUM  Final   Special Requests NONE  Final   Sputum evaluation   Final    THIS SPECIMEN IS ACCEPTABLE FOR SPUTUM CULTURE Performed at [facility], [address] [place], [place] [address]    Report Status 08/21/2023 FINAL  Final  Culture, Respiratory w Gram Stain     Status: None (Preliminary result)   Collection Time: 08/20/23 11:48 PM   Specimen: SPU  Result Value Ref Range Status   Specimen Description   Final    SPUTUM Performed at [facility], [address] [place], [place] [address]    Special Requests   Final    NONE Reflexed from [id number] Performed at [facility], [address] [place], [place] [address]    Gram Stain   Final    FEW SQUAMOUS EPITHELIAL CELLS PRESENT NO WBC SEEN RARE GRAM POSITIVE COCCI    Culture   Final    CULTURE REINCUBATED FOR BETTER GROWTH Performed at [facility], [address] [place], [place] [address]    Report Status PENDING  Incomplete    Studies/Results: US Venous Img Lower Bilateral (DVT)  Result Date: 08/21/2023 CLINICAL DATA:  Positive D-dimer. EXAM: BILATERAL LOWER EXTREMITY VENOUS DOPPLER ULTRASOUND TECHNIQUE: Gray-scale sonography with graded compression, as well as color Doppler  and duplex ultrasound were performed to evaluate the lower extremity deep venous systems from the level of the common femoral vein and including the common femoral, femoral, profunda femoral, popliteal and calf veins including the posterior tibial, peroneal and gastrocnemius veins when visible. The superficial great saphenous vein was also interrogated. Spectral Doppler was utilized to evaluate flow at rest and with distal augmentation maneuvers in the common femoral, femoral and popliteal veins. COMPARISON:  April 20, 2021 FINDINGS: RIGHT LOWER EXTREMITY Common Femoral Vein: No evidence of thrombus. Normal compressibility, respiratory phasicity and response to augmentation. Saphenofemoral Junction: No evidence of thrombus. Normal compressibility and flow on color Doppler imaging. Profunda Femoral Vein: No evidence of thrombus. Normal compressibility and flow on color Doppler imaging. Femoral Vein: No evidence of thrombus. Normal compressibility, respiratory phasicity and response to augmentation. Popliteal Vein: No evidence of thrombus. Normal compressibility, respiratory phasicity and response to augmentation. Calf Veins: No evidence of thrombus. Normal compressibility and flow on color Doppler imaging. Superficial Great Saphenous Vein: No evidence of thrombus. Normal compressibility. Venous Reflux:  None. Other Findings:  None. LEFT LOWER EXTREMITY Common Femoral Vein: No evidence of thrombus. Normal compressibility, respiratory phasicity and response to augmentation. Saphenofemoral Junction: No evidence of thrombus. Normal compressibility and flow on color Doppler imaging. Profunda Femoral Vein: No evidence of thrombus. Normal compressibility and flow on  color Doppler imaging. Femoral Vein: No evidence of thrombus. Normal compressibility, respiratory phasicity and response to augmentation. Popliteal Vein: No evidence of thrombus. Normal compressibility, respiratory phasicity and response to augmentation. Calf Veins: No evidence of thrombus. Normal compressibility and flow on color Doppler imaging. Superficial Great Saphenous Vein: No evidence of thrombus. Normal compressibility. Venous Reflux:  None. Other Findings:  None. IMPRESSION: No evidence of deep venous thrombosis in either lower extremity. Electronically Signed   By: Aram Candela M.D.   On: 08/21/2023 00:15   CT Angio Chest Pulmonary Embolism (PE) W or WO Contrast  Result Date: 08/20/2023 CLINICAL DATA:  Pulmonary embolism (PE) suspected, high prob EXAM: CT ANGIOGRAPHY CHEST WITH CONTRAST TECHNIQUE: Multidetector CT imaging of the chest was performed using the standard protocol during bolus administration of intravenous contrast. Multiplanar CT image reconstructions and MIPs were obtained to evaluate the vascular anatomy. RADIATION DOSE REDUCTION: This exam was performed according to the departmental dose-optimization program which includes automated exposure control, adjustment of the mA and/or kV according to patient size and/or use of iterative reconstruction technique. CONTRAST:  75mL OMNIPAQUE IOHEXOL 350 MG/ML SOLN COMPARISON:  Chest x-ray 08/20/2023, CT angio chest 04/19/2023. FINDINGS: Cardiovascular: Fair opacification of the pulmonary arteries to the segmental level. No evidence of pulmonary central or segmental embolism. Limited evaluation of the subsegmental level due to motion artifact and timing of contrast. Normal heart size. No significant pericardial effusion. The thoracic aorta is normal in caliber. Mild-to-moderate atherosclerotic plaque of the thoracic aorta. No coronary artery calcifications. Aortic valve leaflet and mitral annular calcifications. Mediastinum/Nodes: Left  axillary lymph node dissection. No enlarged axillary lymph nodes. Slightly more conspicuous enlarged right hilar lymph node measuring 1.2 cm. No left hilar lymphadenopathy. Slightly more conspicuous enlarged mediastinal lymph nodes with a 1 cm prevascular and 1.1 cm right paratracheal lymph node. Thyroid gland, trachea, and esophagus demonstrate no significant findings. Lungs/Pleura: Mild centrilobular emphysematous changes. Chronic peripheral reticulation and cystic changes. No focal consolidation. No pulmonary nodule. No pulmonary mass. No pleural effusion. No pneumothorax. Upper Abdomen: No acute abnormality. Fluid density lesions within bilateral kidneys likely represent simple renal cysts. Simple renal cysts, in the  absence of clinically indicated signs/symptoms, require no independent follow-up. Musculoskeletal: No chest wall abnormality. No suspicious lytic or blastic osseous lesions. No acute displaced fracture. Multilevel degenerative changes of the spine. Review of the MIP images confirms the above findings. IMPRESSION: 1. No central or segmental pulmonary embolus with markedly limited evaluation more distally due to timing of contrast and motion artifact. 2. No acute intrathoracic abnormality. 3. Slightly more conspicuous mediastinal and right hilar lymphadenopathy. Unclear etiology. Recommend attention on follow-up. 4. Chronic pulmonary fibrosis/interstitial lung disease changes. 5. Emphysema (ICD10-J43.9). 6. Aortic Atherosclerosis (ICD10-I70.0) including mitral annular and aortic valve leaflet calcifications-correlate for aortic stenosis. Electronically Signed   By: Tish Frederickson M.D.   On: 08/20/2023 23:29   CT HEAD WO CONTRAST ( )  Result Date: 08/20/2023 CLINICAL DATA:  Head trauma, moderate-severe; Neck trauma (Age >= 65y) EXAM: CT HEAD WITHOUT CONTRAST CT CERVICAL SPINE WITHOUT CONTRAST TECHNIQUE: Multidetector CT imaging of the head and cervical spine was performed following the standard  protocol without intravenous contrast. Multiplanar CT image reconstructions of the cervical spine were also generated. RADIATION DOSE REDUCTION: This exam was performed according to the departmental dose-optimization program which includes automated exposure control, adjustment of the mA and/or kV according to patient size and/or use of iterative reconstruction technique. COMPARISON:  CT Head 02/25/23 CT c spine 02/25/23 FINDINGS: CT HEAD FINDINGS Brain: No hemorrhage. No hydrocephalus. No extra-axial fluid collection. No CT evidence of an acute cortical infarct. No mass effect. No mass lesion. Vascular: No hyperdense vessel or unexpected calcification. Skull: Normal. Negative for fracture or focal lesion. Sinuses/Orbits: No middle ear or mastoid effusion. Paranasal sinuses are clear. Orbits are unremarkable. Other: None. CT CERVICAL SPINE FINDINGS Alignment: Straightening of the normal cervical lordosis. Skull base and vertebrae: No acute fracture. No primary bone lesion or focal pathologic process. Soft tissues and spinal canal: No prevertebral fluid or swelling. No visible canal hematoma. Disc levels:  No CT evidence of high-grade spinal canal stenosis. Upper chest: Negative. Other: No IMPRESSION: 1. No CT evidence of intracranial injury. 2. No acute fracture or traumatic subluxation of the cervical spine. Electronically Signed   By: Lorenza Cambridge M.D.   On: 08/20/2023 19:03   CT Cervical Spine Wo Contrast  Result Date: 08/20/2023 CLINICAL DATA:  Head trauma, moderate-severe; Neck trauma (Age >= 65y) EXAM: CT HEAD WITHOUT CONTRAST CT CERVICAL SPINE WITHOUT CONTRAST TECHNIQUE: Multidetector CT imaging of the head and cervical spine was performed following the standard protocol without intravenous contrast. Multiplanar CT image reconstructions of the cervical spine were also generated. RADIATION DOSE REDUCTION: This exam was performed according to the departmental dose-optimization program which includes automated  exposure control, adjustment of the mA and/or kV according to patient size and/or use of iterative reconstruction technique. COMPARISON:  CT Head 02/25/23 CT c spine 02/25/23 FINDINGS: CT HEAD FINDINGS Brain: No hemorrhage. No hydrocephalus. No extra-axial fluid collection. No CT evidence of an acute cortical infarct. No mass effect. No mass lesion. Vascular: No hyperdense vessel or unexpected calcification. Skull: Normal. Negative for fracture or focal lesion. Sinuses/Orbits: No middle ear or mastoid effusion. Paranasal sinuses are clear. Orbits are unremarkable. Other: None. CT CERVICAL SPINE FINDINGS Alignment: Straightening of the normal cervical lordosis. Skull base and vertebrae: No acute fracture. No primary bone lesion or focal pathologic process. Soft tissues and spinal canal: No prevertebral fluid or swelling. No visible canal hematoma. Disc levels:  No CT evidence of high-grade spinal canal stenosis. Upper chest: Negative. Other: No IMPRESSION: 1. No CT evidence of  intracranial injury. 2. No acute fracture or traumatic subluxation of the cervical spine. Electronically Signed   By: Lorenza Cambridge M.D.   On: 08/20/2023 19:03   DG Chest Portable 1 View  Result Date: 08/20/2023 CLINICAL DATA:  Cough and weakness EXAM: PORTABLE CHEST 1 VIEW COMPARISON:  X-ray 06/18/2023 and older. FINDINGS: Underinflation. Diffuse interstitial changes are identified, slightly more pronounced today than previous. Elevation of the right hemidiaphragm. Slightly more opacity at the bases. No pneumothorax. Enlarged cardiopericardial silhouette. Tortuous ectatic aorta. Surgical clips in the left axillary region. IMPRESSION: Slight increase in lung base opacities. Subtle infiltrate is possible. Recommend follow-up. Underlying fibrotic, interstitial changes again seen of the lungs. Please correlate with the history. Elevation of the right hemidiaphragm Electronically Signed   By: Karen Kays M.D.   On: 08/20/2023 17:05    Medications: I have reviewed the patient's current medications. Prior to Admission:  Medications Prior to Admission  Medication Sig Dispense Refill Last Dose   acetaminophen (TYLENOL) 500 MG tablet Take 500 mg by mouth every 6 (six) hours as needed.   Past Week   albuterol (PROAIR HFA) 108 (90 Base) MCG/ACT inhaler Inhale 2 puffs into the lungs every 4 (four) hours as needed for wheezing or shortness of breath.    prn at unk   apixaban (ELIQUIS) 5 MG TABS tablet Take 1 tablet (5 mg total) by mouth 2 (two) times daily. 90 tablet 1 08/20/2023   atorvastatin (LIPITOR) 20 MG tablet Take 20 mg by mouth daily.   08/20/2023   cetirizine (ZYRTEC) 5 MG tablet Take 1 tablet (5 mg total) by mouth daily. 30 tablet 2 prn at unk   cyanocobalamin (VITAMIN B12) 1000 MCG tablet Take 1 tablet (1,000 mcg total) by mouth daily. 90 tablet 1 08/20/2023   fluticasone-salmeterol (ADVAIR) 250-50 MCG/ACT AEPB Inhale 1 puff into the lungs in the morning and at bedtime.   08/20/2023   gabapentin (NEURONTIN) 300 MG capsule Take 300 mg by mouth at bedtime. Take 2 caspules by mouth at bedtime   08/20/2023   lisinopril (ZESTRIL) 10 MG tablet Take 5 mg by mouth daily.   08/20/2023   meclizine (ANTIVERT) 12.5 MG tablet Take 1 tablet (12.5 mg total) by mouth 3 (three) times daily as needed for dizziness. 30 tablet 0 Past Week   pantoprazole (PROTONIX) 40 MG tablet Take 1 tablet (40 mg total) by mouth 2 (two) times daily. 180 tablet 1 08/20/2023   SPIRIVA HANDIHALER 18 MCG inhalation capsule Place 1 capsule (18 mcg total) into inhaler and inhale daily. 30 capsule 2 08/20/2023   traMADol (ULTRAM) 50 MG tablet Take 50 mg by mouth 2 (two) times daily as needed.    08/20/2023   triamcinolone ointment (KENALOG) 0.1 % Apply 1 Application topically 2 (two) times daily.   Past Week   ondansetron (ZOFRAN ODT) 4 MG disintegrating tablet Take 1 tablet (4 mg total) by mouth every 8 (eight) hours as needed. (Patient not taking: Reported on  08/21/2023) 15 tablet 0 Not Taking   Scheduled:  atorvastatin  20 mg Oral Daily   bisacodyl  10 mg Oral QHS   cyanocobalamin  1,000 mcg Oral Daily   folic acid  1 mg Oral Daily   gabapentin  600 mg Oral QHS   ipratropium-albuterol  3 mL Nebulization BID   mometasone-formoterol  2 puff Inhalation BID   pantoprazole (PROTONIX) IV  40 mg Intravenous Q12H   polyethylene glycol  17 g Oral BID   polyethylene glycol powder  1 Container Oral Once   Continuous:  sodium chloride 20 mL/hr at 08/21/23 2220   WUJ:[id number], albuterol, bisacodyl, dextromethorphan-guaiFENesin, hydrALAZINE, hydrOXYzine, meclizine, ondansetron (ZOFRAN) IV, traMADol Anti-infectives (From admission, onward)    Start     Dose/Rate Route Frequency Ordered Stop   08/20/23 1715  cefTRIAXone (ROCEPHIN) 2 g in sodium chloride 0.9 % 100 mL IVPB        2 g 200 mL/hr over 30 Minutes Intravenous  Once 08/20/23 1710 08/20/23 2144   08/20/23 1715  azithromycin (ZITHROMAX) 500 mg in sodium chloride 0.9 % 250 mL IVPB        500 mg 250 mL/hr over 60 Minutes Intravenous  Once 08/20/23 1710 08/21/23 0419      Scheduled Meds:  atorvastatin  20 mg Oral Daily   bisacodyl  10 mg Oral QHS   cyanocobalamin  1,000 mcg Oral Daily   folic acid  1 mg Oral Daily   gabapentin  600 mg Oral QHS   ipratropium-albuterol  3 mL Nebulization BID   mometasone-formoterol  2 puff Inhalation BID   pantoprazole (PROTONIX) IV  40 mg Intravenous Q12H   polyethylene glycol  17 g Oral BID   Continuous Infusions:  sodium chloride 20 mL/hr at 08/21/23 2220   PRN Meds:.acetaminophen, albuterol, bisacodyl, dextromethorphan-guaiFENesin, hydrALAZINE, hydrOXYzine, meclizine, ondansetron (ZOFRAN) IV, traMADol   Assessment: Principal Problem:   Syncope Active Problems:   PAF (paroxysmal atrial fibrillation) (HCC)   HTN (hypertension)   COPD (chronic obstructive pulmonary disease) (HCC)   Hypokalemia   CKD stage 3a, GFR 45-59 ml/min (HCC)   Acute  on chronic anemia   Infestation by bed bug   Normocytic anemia   Obesity (BMI 30-39.9)   Type II diabetes mellitus with renal manifestations (HCC)   Iron deficiency anemia due to chronic blood loss  Tracy Dickerson is a 82 y.o. female with history of COPD, A-fib on Eliquis, diabetes with history of chronic iron deficiency anemia presents with acute on chronic symptomatic iron deficiency anemia without evidence of active GI bleed.  Patient responded appropriately to blood transfusion    Plan: Recommend EGD and colonoscopy with possible video capsule endoscopy Did not finish her prep today Continue clear liquids today N.p.o. effective 5 AM tomorrow Discussed with patient to complete rest of the bowel prep and ordered MiraLAX bowel prep if needed, discussed with patient's nurse as well to start prep now Recommend parenteral iron on while inpatient, oral iron as outpatient   I have discussed alternative options, risks & benefits,  which include, but are not limited to, bleeding, infection, perforation,respiratory complication & drug reaction.  The patient agrees with this plan & written consent will be obtained.     LOS: 0 days   Login Muckleroy 08/22/2023, 12:27 PM

## 2023-08-22 NOTE — Plan of Care (Signed)
  Problem: Education: Goal: Knowledge of General Education information will improve Description: Including pain rating scale, medication(s)/side effects and non-pharmacologic comfort measures Outcome: Progressing   Problem: Health Behavior/Discharge Planning: Goal: Ability to manage health-related needs will improve Outcome: Progressing   Problem: Clinical Measurements: Goal: Ability to maintain clinical measurements within normal limits will improve Outcome: Progressing Goal: Will remain free from infection Outcome: Progressing Goal: Diagnostic test results will improve Outcome: Progressing Goal: Respiratory complications will improve Outcome: Progressing Goal: Cardiovascular complication will be avoided Outcome: Progressing   Problem: Nutrition: Goal: Adequate nutrition will be maintained Outcome: Progressing   Problem: Coping: Goal: Level of anxiety will decrease Outcome: Progressing   Problem: Elimination: Goal: Will not experience complications related to bowel motility Outcome: Progressing   Problem: Safety: Goal: Ability to remain free from injury will improve Outcome: Progressing

## 2023-08-22 NOTE — TOC Initial Note (Signed)
Transition of Care [facility]) - Initial/Assessment Note    Patient Details  Name: Tracy Dickerson MRN: [id number] Date of Birth: 11-07-1940  Transition of Care [facility]) CM/SW Contact:    Liliana Cline, LCSW Phone Number: 08/22/2023, 4:39 PM  Clinical Narrative:                 Patient is from home with brother. Uses [employer] transportation. Has DME at home. Spoke to patient who agrees to SNF, declines SNF history, denies preferences. SNF work up started.   Expected Discharge Plan: Skilled Nursing Facility Barriers to Discharge: Continued Medical Work up   Patient Goals and CMS Choice Patient states their goals for this hospitalization and ongoing recovery are:: agreeable to SNF CMS Medicare.gov Compare Post Acute Care list provided to:: Patient Choice offered to / list presented to : Patient      Expected Discharge Plan and Services       Living arrangements for the past 2 months: Single Family Home                                      Prior Living Arrangements/Services Living arrangements for the past 2 months: Single Family Home Lives with:: Siblings Patient language and need for interpreter reviewed:: Yes Do you feel safe going back to the place where you live?: Yes      Need for Family Participation in Patient Care: Yes (Comment) Care giver support system in place?: Yes (comment) Current home services: DME Criminal Activity/Legal Involvement Pertinent to Current Situation/Hospitalization: No - Comment as needed  Activities of Daily Living   ADL Screening (condition at time of admission) Independently performs ADLs?: Yes (appropriate for developmental age) Is the patient deaf or have difficulty hearing?: No Does the patient have difficulty seeing, even when wearing glasses/contacts?: No Does the patient have difficulty concentrating, remembering, or making decisions?: No  Permission Sought/Granted Permission sought to share information with : Facility Games developer granted to share information with : Yes, Verbal Permission Granted     Permission granted to share info w AGENCY: SNFs        Emotional Assessment         Alcohol / Substance Use: Not Applicable Psych Involvement: No (comment)  Admission diagnosis:  Dizziness [R42] Generalized weakness [R53.1] Community acquired pneumonia, unspecified laterality [J18.9] Patient Active Problem List   Diagnosis Date Noted   Iron deficiency anemia due to chronic blood loss 08/21/2023   Syncope 08/20/2023   Normocytic anemia 08/20/2023   Obesity (BMI 30-39.9) 08/20/2023   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/2023   B12 deficiency 06/20/2023   Carotid stenosis 06/20/2023   Dementia (HCC) 06/19/2023   Generalized weakness 06/18/2023   Acute on chronic anemia 06/18/2023   H/O: upper GI bleed 06/18/2023   Chronic anticoagulation 06/18/2023   Infestation by bed bug 06/18/2023   CKD stage 3a, GFR 45-59 ml/min (HCC) 01/07/2023   Overweight (BMI 25.0-29.9) 01/06/2023   Type 2 diabetes mellitus with hyperlipidemia (HCC) 01/06/2023   COPD (chronic obstructive pulmonary disease) (HCC) 01/06/2023   Hypokalemia 01/06/2023   UGIB (upper gastrointestinal bleed) 01/05/2023   Unsatisfactory living conditions 01/05/2023   Constipation    UTI (urinary tract infection) 08/13/2020   HLD (hyperlipidemia) 08/13/2020   Acute renal failure superimposed on stage 3a chronic kidney disease (HCC) 08/13/2020   PAF (paroxysmal atrial fibrillation) (HCC) 08/13/2020   HTN (  hypertension) 08/13/2020   Dehydration    Dehydration, moderate    Acute renal failure (HCC) 04/22/2020   Hyperkalemia    Septic shock (HCC)    Sepsis due to urinary tract infection (HCC) 03/13/2018   Acute kidney injury superimposed on chronic kidney disease (HCC) 02/13/2018   Closed extra-articular fracture of distal tibia 02/02/2018   Pneumonia 10/29/2017   Hypotension 01/26/2017   Lung  nodule 05/30/2014   Sleep apnea 05/10/2014   Barrett's esophagus 05/13/2007   Malignant neoplasm of breast (female) (HCC) 06/01/1997   PCP:  Dwana Melena, PA Pharmacy:   [facility] [address] [place], [place] - [address] [facility] [address] [place] [address] Phone: [phone] Fax: [phone]  [facility] - [place], [place] - [address] [place] [address] Phone: [phone] Fax: [phone]  [id number] [place], [place] - [address] AT [facility] [address] [place] [address] Phone: [phone] Fax: [phone]  [facility] - [place], [place] - [address] [place] [address] Phone: [phone] Fax: [phone]  [facility] #[id number] [place], [place] - [address] AT [facility] OF [address] [place] [address] Phone: [phone] Fax: [phone]  [id number] - [place], [place] - [address] AT [facility] OF [address] [facility] [address] [place] [address] Phone: [phone] Fax: [phone]  [facility][id number] [place], [place] - [address] AT [facility] OF [address] [place] [address] [place] [address] Phone: [phone] Fax: [phone]     Social Determinants of Health (SDOH) Social History: SDOH Screenings   Food Insecurity: No Food Insecurity (08/21/2023)  Housing: Medium Risk (08/21/2023)  Transportation Needs: No Transportation Needs (08/21/2023)  Utilities: Not At Risk (08/21/2023)  Financial Resource Strain: Low Risk  (02/04/2023)   Received from [facility], [facility]  Physical Activity: Inactive (07/16/2021)   Received from [facility], [facility]  Stress: No Stress Concern Present (07/16/2021)   Received from [facility], [facility]  Tobacco Use: Medium Risk (08/21/2023)  Health Literacy:  Medium Risk (07/16/2021)   Received from [facility], [facility]   SDOH Interventions:     Readmission Risk Interventions     No data to display

## 2023-08-23 ENCOUNTER — Ambulatory Visit: Admit: 2023-08-23 | Payer: 59 | Admitting: Gastroenterology

## 2023-08-23 ENCOUNTER — Encounter
Admission: EM | Disposition: A | Discharge status: Skilled Nursing Facility | Payer: Self-pay | Source: Home / Self Care | Attending: Internal Medicine

## 2023-08-23 ENCOUNTER — Observation Stay: Payer: 59 | Admitting: Anesthesiology

## 2023-08-23 ENCOUNTER — Encounter: Payer: Self-pay | Admitting: Internal Medicine

## 2023-08-23 DIAGNOSIS — D509 Iron deficiency anemia, unspecified: Secondary | ICD-10-CM | POA: Diagnosis not present

## 2023-08-23 DIAGNOSIS — J449 Chronic obstructive pulmonary disease, unspecified: Secondary | ICD-10-CM | POA: Diagnosis not present

## 2023-08-23 DIAGNOSIS — E876 Hypokalemia: Secondary | ICD-10-CM | POA: Diagnosis not present

## 2023-08-23 DIAGNOSIS — R55 Syncope and collapse: Secondary | ICD-10-CM | POA: Diagnosis not present

## 2023-08-23 DIAGNOSIS — D649 Anemia, unspecified: Secondary | ICD-10-CM | POA: Diagnosis not present

## 2023-08-23 HISTORY — PX: COLONOSCOPY WITH PROPOFOL: SHX5780

## 2023-08-23 HISTORY — PX: GIVENS CAPSULE STUDY: SHX5432

## 2023-08-23 HISTORY — PX: ESOPHAGOGASTRODUODENOSCOPY: SHX5428

## 2023-08-23 LAB — COMPREHENSIVE METABOLIC PANEL
ALT: 9 U/L (ref 0–44)
AST: 16 U/L (ref 15–41)
Albumin: 2.9 g/dL — ABNORMAL LOW (ref 3.5–5.0)
Alkaline Phosphatase: 66 U/L (ref 38–126)
Anion gap: 7 (ref 5–15)
BUN: 10 mg/dL (ref 8–23)
CO2: 23 mmol/L (ref 22–32)
Calcium: 8.1 mg/dL — ABNORMAL LOW (ref 8.9–10.3)
Chloride: 110 mmol/L (ref 98–111)
Creatinine, Ser: 1.01 mg/dL — ABNORMAL HIGH (ref 0.44–1.00)
GFR, Estimated: 56 mL/min — ABNORMAL LOW (ref >60)
Glucose, Bld: 79 mg/dL (ref 70–99)
Potassium: 3.6 mmol/L (ref 3.5–5.1)
Sodium: 140 mmol/L (ref 135–145)
Total Bilirubin: 0.4 mg/dL (ref <1.2)
Total Protein: 5.8 g/dL — ABNORMAL LOW (ref 6.5–8.1)

## 2023-08-23 LAB — CBC WITH DIFFERENTIAL/PLATELET
Abs Immature Granulocytes: 0.04 10*3/uL (ref 0.00–0.07)
Basophils Absolute: 0.1 10*3/uL (ref 0.0–0.1)
Basophils Relative: 1 %
Eosinophils Absolute: 0.5 10*3/uL (ref 0.0–0.5)
Eosinophils Relative: 7 %
HCT: 24 % — ABNORMAL LOW (ref 36.0–46.0)
Hemoglobin: 8.3 g/dL — ABNORMAL LOW (ref 12.0–15.0)
Immature Granulocytes: 1 %
Lymphocytes Relative: 19 %
Lymphs Abs: 1.3 10*3/uL (ref 0.7–4.0)
MCH: 31.1 pg (ref 26.0–34.0)
MCHC: 34.6 g/dL (ref 30.0–36.0)
MCV: 89.9 fL (ref 80.0–100.0)
Monocytes Absolute: 0.5 10*3/uL (ref 0.1–1.0)
Monocytes Relative: 8 %
Neutro Abs: 4.4 10*3/uL (ref 1.7–7.7)
Neutrophils Relative %: 64 %
Platelets: 205 10*3/uL (ref 150–400)
RBC: 2.67 MIL/uL — ABNORMAL LOW (ref 3.87–5.11)
RDW: 18.6 % — ABNORMAL HIGH (ref 11.5–15.5)
WBC: 6.8 10*3/uL (ref 4.0–10.5)
nRBC: 0 % (ref 0.0–0.2)

## 2023-08-23 LAB — HAPTOGLOBIN: Haptoglobin: 147 mg/dL (ref 42–346)

## 2023-08-23 LAB — MAGNESIUM: Magnesium: 2.3 mg/dL (ref 1.7–2.4)

## 2023-08-23 LAB — PHOSPHORUS: Phosphorus: 3.2 mg/dL (ref 2.5–4.6)

## 2023-08-23 SURGERY — EGD (ESOPHAGOGASTRODUODENOSCOPY)
Anesthesia: General

## 2023-08-23 MED ORDER — PROPOFOL 500 MG/50ML IV EMUL
INTRAVENOUS | Status: DC | PRN
  Administered 2023-08-23: 140 ug/kg/min via INTRAVENOUS

## 2023-08-23 MED ORDER — PHENYLEPHRINE HCL (PRESSORS) 10 MG/ML IV SOLN
INTRAVENOUS | Status: DC | PRN
  Administered 2023-08-23: 160 ug via INTRAVENOUS

## 2023-08-23 MED ORDER — PROPOFOL 10 MG/ML IV BOLUS
INTRAVENOUS | Status: DC | PRN
  Administered 2023-08-23: 30 mg via INTRAVENOUS
  Administered 2023-08-23 (×2): 60 mg via INTRAVENOUS

## 2023-08-23 MED ORDER — IRON SUCROSE 300 MG IVPB - SIMPLE MED
300.0000 mg | Freq: Once | Status: AC
  Administered 2023-08-23: 300 mg via INTRAVENOUS
  Filled 2023-08-23: qty 300

## 2023-08-23 MED ORDER — LIDOCAINE HCL (CARDIAC) PF 100 MG/5ML IV SOSY
PREFILLED_SYRINGE | INTRAVENOUS | Status: DC | PRN
  Administered 2023-08-23: 60 mg via INTRAVENOUS

## 2023-08-23 MED ORDER — SODIUM CHLORIDE 0.9 % IV SOLN: INTRAVENOUS | Status: DC

## 2023-08-23 NOTE — Progress Notes (Signed)
Report given to shift nurse. Givens capsule administered per MD order. Pt and nurse given instructions with verbal understanding. This nurse will pick equipment up from unit tonight.

## 2023-08-23 NOTE — Op Note (Signed)
[facility] Gastroenterology Patient Name: Tracy Dickerson Procedure Date: 08/23/2023 11:28 AM MRN: [id number] Account #: [id number] Date of Birth: 1940/12/29 Admit Type: Outpatient Age: 82 Room: [facility] ENDO ROOM 4 Gender: Female Note Status: Finalized Instrument Name: Nelda Marseille [id number] Procedure:             Colonoscopy Indications:           Unexplained iron deficiency anemia Providers:             Toney Reil MD, MD Medicines:             General Anesthesia Complications:         No immediate complications. Estimated blood loss: None. Procedure:             Pre-Anesthesia Assessment:                        - Prior to the procedure, a History and Physical was                         performed, and patient medications and allergies were                         reviewed. The patient is competent. The risks and                         benefits of the procedure and the sedation options and                         risks were discussed with the patient. All questions                         were answered and informed consent was obtained.                         Patient identification and proposed procedure were                         verified by the physician, the nurse, the                         anesthesiologist, the anesthetist and the technician                         in the pre-procedure area in the procedure room in the                         endoscopy suite. Mental Status Examination: alert and                         oriented. Airway Examination: normal oropharyngeal                         airway and neck mobility. Respiratory Examination:                         clear to auscultation. CV Examination: normal.                         Prophylactic Antibiotics: The patient  does not require                         prophylactic antibiotics. Prior Anticoagulants: The                         patient has taken no anticoagulant or antiplatelet                          agents. ASA Grade Assessment: III - A patient with                         severe systemic disease. After reviewing the risks and                         benefits, the patient was deemed in satisfactory                         condition to undergo the procedure. The anesthesia                         plan was to use general anesthesia. Immediately prior                         to administration of medications, the patient was                         re-assessed for adequacy to receive sedatives. The                         heart rate, respiratory rate, oxygen saturations,                         blood pressure, adequacy of pulmonary ventilation, and                         response to care were monitored throughout the                         procedure. The physical status of the patient was                         re-assessed after the procedure.                        After obtaining informed consent, the colonoscope was                         passed under direct vision. Throughout the procedure,                         the patient's blood pressure, pulse, and oxygen                         saturations were monitored continuously. The                         Colonoscope was introduced through the anus and  advanced to the the cecum, identified by appendiceal                         orifice and ileocecal valve. The colonoscopy was                         extremely difficult due to inadequate bowel prep,                         significant looping and the patient's body habitus.                         Successful completion of the procedure was aided by                         applying abdominal pressure. The patient tolerated the                         procedure well. The quality of the bowel preparation                         was poor. The ileocecal valve, appendiceal orifice,                         and rectum were photographed. Findings:      The  perianal and digital rectal examinations were normal. Pertinent       negatives include normal sphincter tone and no palpable rectal lesions.      Copious quantities of semi-liquid semi-solid stool was found in the       entire colon, precluding visualization.      The retroflexed view of the distal rectum and anal verge was normal and       showed no anal or rectal abnormalities. Impression:            - Preparation of the colon was poor.                        - Stool in the entire examined colon.                        - The distal rectum and anal verge are normal on                         retroflexion view.                        - No specimens collected. Recommendation:        - Return patient to hospital ward for ongoing care.                        - Resume previous diet today.                        - To visualize the small bowel, perform video capsule                         endoscopy today. Procedure Code(s):     --- Professional ---  16109, Colonoscopy, flexible; diagnostic, including                         collection of specimen(s) by brushing or washing, when                         performed (separate procedure) Diagnosis Code(s):     --- Professional ---                        D50.9, Iron deficiency anemia, unspecified CPT copyright 2022 American Medical Association. All rights reserved. The codes documented in this report are preliminary and upon coder review may  be revised to meet current compliance requirements. Dr. Libby Maw Toney Reil MD, MD 08/23/2023 12:24:43 PM This report has been signed electronically. Number of Addenda: 0 Note Initiated On: 08/23/2023 11:28 AM Scope Withdrawal Time: 0 hours 2 minutes 14 seconds  Total Procedure Duration: 0 hours 12 minutes 52 seconds  Estimated Blood Loss:  Estimated blood loss: none.      [facility]

## 2023-08-23 NOTE — Progress Notes (Signed)
This nurse returned to pick up givens capsule equipment from pt. Noted pt in the bed and equipment on the bedside table. Pt stated that she removed the equipment because it was too heavy at 7pm. She was educated and reported off to previous shift nurse that equipment should remain on pt until pickup time. Spoke with nurse who stated that she was not aware that pt had removed equipment.

## 2023-08-23 NOTE — Plan of Care (Signed)
  Problem: Education: Goal: Knowledge of General Education information will improve Description: Including pain rating scale, medication(s)/side effects and non-pharmacologic comfort measures Outcome: Progressing   Problem: Health Behavior/Discharge Planning: Goal: Ability to manage health-related needs will improve Outcome: Progressing   Problem: Clinical Measurements: Goal: Ability to maintain clinical measurements within normal limits will improve Outcome: Progressing Goal: Will remain free from infection Outcome: Progressing Goal: Diagnostic test results will improve Outcome: Progressing Goal: Respiratory complications will improve Outcome: Progressing Goal: Cardiovascular complication will be avoided Outcome: Progressing   Problem: Activity: Goal: Risk for activity intolerance will decrease Outcome: Progressing   Problem: Nutrition: Goal: Adequate nutrition will be maintained Outcome: Progressing   Problem: Coping: Goal: Level of anxiety will decrease Outcome: Progressing   Problem: Pain Management: Goal: General experience of comfort will improve Outcome: Progressing

## 2023-08-23 NOTE — Op Note (Signed)
[facility] Gastroenterology Patient Name: Tracy Dickerson Procedure Date: 08/23/2023 11:28 AM MRN: [id number] Account #: [id number] Date of Birth: May 26, 1941 Admit Type: Outpatient Age: 82 Room: [facility] ENDO ROOM 4 Gender: Female Note Status: Finalized Instrument Name: Patton Salles Endoscope [id number] Procedure:             Upper GI endoscopy Indications:           Iron deficiency anemia Providers:             Toney Reil MD, MD Medicines:             General Anesthesia Complications:         No immediate complications. Estimated blood loss: None. Procedure:             Pre-Anesthesia Assessment:                        - Prior to the procedure, a History and Physical was                         performed, and patient medications and allergies were                         reviewed. The patient is competent. The risks and                         benefits of the procedure and the sedation options and                         risks were discussed with the patient. All questions                         were answered and informed consent was obtained.                         Patient identification and proposed procedure were                         verified by the physician, the nurse, the                         anesthesiologist, the anesthetist and the technician                         in the pre-procedure area in the procedure room in the                         endoscopy suite. Mental Status Examination: alert and                         oriented. Airway Examination: normal oropharyngeal                         airway and neck mobility. Respiratory Examination:                         clear to auscultation. CV Examination: normal.                         Prophylactic Antibiotics:  The patient does not require                         prophylactic antibiotics. Prior Anticoagulants: The                         patient has taken no anticoagulant or antiplatelet                          agents. ASA Grade Assessment: III - A patient with                         severe systemic disease. After reviewing the risks and                         benefits, the patient was deemed in satisfactory                         condition to undergo the procedure. The anesthesia                         plan was to use general anesthesia. Immediately prior                         to administration of medications, the patient was                         re-assessed for adequacy to receive sedatives. The                         heart rate, respiratory rate, oxygen saturations,                         blood pressure, adequacy of pulmonary ventilation, and                         response to care were monitored throughout the                         procedure. The physical status of the patient was                         re-assessed after the procedure.                        After obtaining informed consent, the endoscope was                         passed under direct vision. Throughout the procedure,                         the patient's blood pressure, pulse, and oxygen                         saturations were monitored continuously. The Endoscope                         was introduced through the mouth, and advanced to the  third part of duodenum. The upper GI endoscopy was                         accomplished without difficulty. The patient tolerated                         the procedure well. Findings:      The esophagus was normal.      The stomach was normal.      The examined duodenum was normal. Impression:            - Normal esophagus.                        - Normal stomach.                        - Normal examined duodenum.                        - No specimens collected. Recommendation:        - Proceed with colonoscopy as scheduled                        See colonoscopy report Procedure Code(s):     --- Professional ---                         [phone], Esophagogastroduodenoscopy, flexible,                         transoral; diagnostic, including collection of                         specimen(s) by brushing or washing, when performed                         (separate procedure) Diagnosis Code(s):     --- Professional ---                        D50.9, Iron deficiency anemia, unspecified CPT copyright 2022 American Medical Association. All rights reserved. The codes documented in this report are preliminary and upon coder review may  be revised to meet current compliance requirements. Dr. Libby Maw Toney Reil MD, MD 08/23/2023 12:07:55 PM This report has been signed electronically. Number of Addenda: 0 Note Initiated On: 08/23/2023 11:28 AM Estimated Blood Loss:  Estimated blood loss: none.      [facility]

## 2023-08-23 NOTE — Progress Notes (Signed)
Patient received from procedure via bed in stable condition.

## 2023-08-23 NOTE — TOC Progression Note (Signed)
Transition of Care [facility]) - Progression Note    Patient Details  Name: Tracy Dickerson MRN: [id number] Date of Birth: 1941/08/30  [facility]) CM/SW Contact  Susa Simmonds, [place] Phone Number: 08/23/2023, 8:45 AM  Clinical Narrative:   No SNF bed offers at this time    Expected Discharge Plan: Skilled Nursing Facility Barriers to Discharge: Continued Medical Work up  Expected Discharge Plan and Services       Living arrangements for the past 2 months: Single Family Home                                       Social Determinants of Health (SDOH) Interventions SDOH Screenings   Food Insecurity: No Food Insecurity (08/21/2023)  Housing: Medium Risk (08/21/2023)  Transportation Needs: No Transportation Needs (08/21/2023)  Utilities: Not At Risk (08/21/2023)  Financial Resource Strain: Low Risk  (02/04/2023)   Received from [facility], [facility]  Physical Activity: Inactive (07/16/2021)   Received from [facility], [facility]  Stress: No Stress Concern Present (07/16/2021)   Received from [facility], [facility]  Tobacco Use: Medium Risk (08/21/2023)  Health Literacy: Medium Risk (07/16/2021)   Received from [facility], [facility]    Readmission Risk Interventions     No data to display

## 2023-08-23 NOTE — Progress Notes (Signed)
PROGRESS NOTE    Tracy Dickerson  QIH:[id number] DOB: May 27, 1941 DOA: 08/20/2023 PCP: Dwana Melena, PA   Brief Narrative:  HPI per Dr. Lorretta Harp on 08/20/23  Tracy Dickerson is a 82 y.o. female with medical history significant of hypertension, hyperlipidemia, diabetes mellitus, GERD, breast cancer, CKD-3a, septic shock due to UTI, PAF on Eliquis, who presents with generalized weakness, syncope, SOB and cough.   Pt states that over the last 2 weeks, she has had progressive worsening generalized weakness. She has dizziness and lightheadedness. She passed out twice today.  No unilateral numbness or tinglings in extremities.  No facial droop or slurred speech.  She has dry cough and shortness of breath.  She had some chest pain in the morning, which has resolved.  No fever or chills.  No nausea, vomiting, diarrhea or abdominal pain.  No symptoms of UTI.  Patient states that she ran out of her medications for more than 2 weeks.  She did not take her medications in the past 2 weeks, including Eliquis.  Per ED RN, pt was found to bed bugs.     Data reviewed independently and ED Course: pt was found to have WBC 7.7, trop 12, D-dimer 1.85, BNP 233, stable renal function, potassium 3.1, negative COVID PCR, temperature normal, blood pressure 110/59, heart rate 68, RR 20, oxygen saturation 95% on room air.  CT of the head and neck is negative for acute injury.  Patient is placed on telemetry bed for observation.   Chest x-ray: Slight increase in lung base opacities. Subtle infiltrate is possible. Recommend follow-up.   Underlying fibrotic, interstitial changes again seen of the lungs. Please correlate with the history. Elevation of the right hemidiaphragm   **Interim History Since Eliquis has been held given her drop in hemoglobin.  She was typed and screened and transfused 1 unit PRBCs.  Continues to be significant orthostatic.  GI is planning for an EGD and colonoscopy with possible video capsule  endoscopy as well and unfortunately was not able to be done yesterday but will hopefully be attempted today  Assessment and Plan:  Syncope -Etiology is not clear but likely in the setting of Anemia.  -Patient reports shortness of breath and had some chest pain earlier, D-dimer +1.85, will need to rule out PE.   -Other differential diagnosis include orthostatic status and vasovagal syncope.   -No focal neurodeficit on physical examination.   -CT head negative.  Low suspicions for stroke. -Orthostatic vital signs Positive -CTA negative for PE, and LE doppler negative for DVT -Neuro checks  -IVF as below  -PT/OT eval and treat and recommending SNF   Orthostatic Hypotension -In the setting of Anemia and ? Bleeding -Gave 500 mL bolus and will be continuing Maintenance IVF with NS @ 40 mL/hr through today -TED hose -PT/OT to evaluate and treat and recommending SNF -Repeat Orthostatic VS   HTN (Hypertension) -Hold Lisinopril due to softer blood pressure -IV hydralazine as needed -Continue to Monitor BP per Protocol -Last BP reading was 111/97   COPD (chronic obstructive pulmonary disease)  -Patient reports dry cough, shortness of breath, no wheezing on auscultation.   -Does not seem to have COPD exacerbation.   -Chest x-ray showed some basilar opacity, but the patient does not have fever or leukocytosis. Procalcitonin < 0.10.  -Clinically does not seem to have pneumonia.   -Patient received 1 dose of Rocephin and azithromycin in ED, will discontinue antibiotics. SpO2: 96 % -C/w Bronchodilators -C/w Dextromethorphan-Guaifenesin 30-600 mg  po BID prn Cough  Vitamin D Deficiency -Vitamin D, 25-Hydroxy was 17.29 -Start Supplementation with Vitamin D 50,000 units weekly   PAF (paroxysmal atrial fibrillation) (HCC): HR 68 -Continue to Hold Eliquis due to low Hb -Resume when stable and when ok with GI   Type II diabetes mellitus with renal manifestations (HCC):  -Recent A1c 5.9,  well-controlled.  Patient seem to be not taking medications. -Check blood sugar every morning -CBG Trend: Recent Labs  Lab 08/21/23 0853 08/22/23 0759  GLUCAP 82 78   Symptomatic Normocytic Anemia/ ? 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 -Hgb/Hct Trend: Recent Labs  Lab 08/11/23 1625 08/20/23 1321 08/21/23 0528 08/21/23 1608 08/22/23 0358 08/23/23 0455  HGB 8.1* 8.1* 6.3* 8.3* 7.9* 8.3*  HCT 23.6* 26.3* 22.1* 26.3* 23.1* 24.0*  MCV 90.4 85.9 83.7  --  84.9 89.9  -Iron Panel was checked and iron level 16, UIBC of 349, TIBC of 365, saturation ratios of 4%, folate level 7.1 and vitamin B12 level 205 -Continuing IV PPI with Pantoprazole 40 mg BID -Check FOBT and was Negative  -GI Recommending Parenteral Iron while Inpatient and oral Iron supplementation as an outpatient  -Hold Anticoagulation currently -Continue to Monitor for S/Sx of Bleeding -GI Planning on EGD and Colonoscopy with possible video capsule endoscopy but patient did not finish bowel prep yesterday so plan is to do the procedures sometime today   CKD Stage 3a -Relatively stable. BUN/Cr Trend: Recent Labs  Lab 08/11/23 1625 08/20/23 1321 08/21/23 0528 08/22/23 0358 08/23/23 0455  BUN 13 11 11 12 10   CREATININE 1.04* 1.03* 1.00 1.24* 1.01*  -Gave her a 500 mL bolus yesterday; Will continue maintenance IVF with NS at 40 mL/hr until it completes today  -Avoid Nephrotoxic Medications, Contrast Dyes, Hypotension and Dehydration to Ensure Adequate Renal Perfusion and will need to Renally Adjust Meds -Continue to Monitor and Trend Renal Function carefully and repeat CMP in the AM   Hypokalemia -Patient's K+ Level Trend: Recent Labs  Lab 08/11/23 1625 08/20/23 1321 08/21/23 0528 08/22/23 0358 08/23/23 0455  K 3.6 3.1* 4.0 4.0 3.6  -Continue to Monitor and Replete as Necessary -Repeat CMP in the AM   Hypoalbuminemia -Patient's Albumin Trend: Recent Labs  Lab 08/11/23 1625  08/21/23 0839 08/23/23 0455  ALBUMIN 3.6 2.7* 2.9*  -Continue to Monitor and Trend and repeat CMP in the AM  Obesity -Complicates overall prognosis and care -Estimated body mass index is 32.28 kg/m as calculated from the following:   Height as of this encounter: 5\' 6"  (1.676 m).   Weight as of this encounter: 90.7 kg.  -Weight Loss and Dietary Counseling given   DVT prophylaxis: Place TED hose Start: 08/22/23 1555 Place and maintain sequential compression device Start: 08/21/23 1155    Code Status: Full Code Family Communication: No family present at bedside  Disposition Plan:  Level of care: Telemetry Medical Status is: Observation The patient will require care spanning > 2 midnights and should be moved to inpatient because: Needs further investigation into her symptomatic anemia with an EGD and colonoscopy   Consultants:  Gastroenterology  Procedures:  As delineated as above  Antimicrobials:  Anti-infectives (From admission, onward)    Start     Dose/Rate Route Frequency Ordered Stop   08/20/23 1715  cefTRIAXone (ROCEPHIN) 2 g in sodium chloride 0.9 % 100 mL IVPB        2 g 200 mL/hr over 30 Minutes Intravenous  Once 08/20/23 1710 08/20/23 2144   08/20/23  1715  azithromycin (ZITHROMAX) 500 mg in sodium chloride 0.9 % 250 mL IVPB        500 mg 250 mL/hr over 60 Minutes Intravenous  Once 08/20/23 1710 08/21/23 0419       Subjective: Seen and examined at bedside was asking about her food.  States that she is doing a bit better today.  States that she was very dizzy yesterday when she got up dizzy today.  No nausea or vomiting.  Denies any other concerns or complaints at this time and has been having bowel movements but has not noticed any blood in them  Objective: Vitals:   08/22/23 2149 08/23/23 0031 08/23/23 0528 08/23/23 0826  BP: 131/73 (!) 125/51 (!) 143/68 (!) 111/97  Pulse: 92 82 71 85  Resp: 16 16 18 18   Temp: 97.8 F (36.6 C) 97.7 F (36.5 C) 97.6 F  (36.4 C) 97.7 F (36.5 C)  TempSrc: Oral Oral Oral   SpO2: 96% 96% 98% 96%  Weight:      Height:        Intake/Output Summary (Last 24 hours) at 08/23/2023 1022 Last data filed at 08/22/2023 1900 Gross per 24 hour  Intake 240 ml  Output --  Net 240 ml   Filed Weights   08/20/23 1319  Weight: 90.7 kg   Examination: Physical Exam:  Constitutional: WN/WD obese elderly chronically ill-appearing Caucasian female in no acute distress Respiratory: Diminished to auscultation bilaterally, no wheezing, rales, rhonchi or crackles. Normal respiratory effort and patient is not tachypenic. No accessory muscle use.  Unlabored breathing Cardiovascular: RRR, no murmurs / rubs / gallops. S1 and S2 auscultated. No extremity edema. .  Abdomen: Soft, non-tender, distended secondary to body habitus. Bowel sounds positive.  GU: Deferred. Musculoskeletal: No clubbing / cyanosis of digits/nails. No joint deformity upper and lower extremities. Skin: No rashes, lesions, ulcers on a limited skin evaluation. No induration; Warm and dry.  Neurologic: CN 2-12 grossly intact with no focal deficits. Romberg sign and cerebellar reflexes not assessed.  Psychiatric: Normal judgment and insight. Alert and oriented x 3. Normal mood and appropriate affect.   Data Reviewed: I have personally reviewed following labs and imaging studies  CBC: Recent Labs  Lab 08/20/23 1321 08/21/23 0528 08/21/23 1608 08/22/23 0358 08/23/23 0455  WBC 7.7 7.5  --  7.4 6.8  NEUTROABS  --   --   --   --  4.4  HGB 8.1* 6.3* 8.3* 7.9* 8.3*  HCT 26.3* 22.1* 26.3* 23.1* 24.0*  MCV 85.9 83.7  --  84.9 89.9  PLT 254 205  --  201 205   Basic Metabolic Panel: Recent Labs  Lab 08/20/23 1321 08/21/23 0528 08/22/23 0358 08/23/23 0455  NA 140 140 141 140  K 3.1* 4.0 4.0 3.6  CL 107 109 109 110  CO2 24 25 25 23   GLUCOSE 133* 89 86 79  BUN 11 11 12 10   CREATININE 1.03* 1.00 1.24* 1.01*  CALCIUM 8.6* 8.0* 8.4* 8.1*  MG 2.3  --   2.3 2.3  PHOS 2.7  --  3.3 3.2   GFR: Estimated Creatinine Clearance: 50.4 mL/min (A) (by C-G formula based on SCr of 1.01 mg/dL (H)). Liver Function Tests: Recent Labs  Lab 08/21/23 0839 08/23/23 0455  AST 16 16  ALT 8 9  ALKPHOS 61 66  BILITOT 0.4 0.4  PROT 5.9* 5.8*  ALBUMIN 2.7* 2.9*   No results for input(s): "LIPASE", "AMYLASE" in the last 168 hours. No results for  input(s): "AMMONIA" in the last 168 hours. Coagulation Profile: No results for input(s): "INR", "PROTIME" in the last 168 hours. Cardiac Enzymes: Recent Labs  Lab 08/21/23 0839  CKTOTAL 20*   BNP (last 3 results) No results for input(s): "PROBNP" in the last 8760 hours. HbA1C: No results for input(s): "HGBA1C" in the last 72 hours. CBG: Recent Labs  Lab 08/21/23 0853 08/22/23 0759  GLUCAP 82 78   Lipid Profile: No results for input(s): "CHOL", "HDL", "LDLCALC", "TRIG", "CHOLHDL", "LDLDIRECT" in the last 72 hours. Thyroid Function Tests: No results for input(s): "TSH", "T4TOTAL", "FREET4", "T3FREE", "THYROIDAB" in the last 72 hours. Anemia Panel: Recent Labs    08/21/23 0500 08/21/23 0528 08/21/23 0839 08/21/23 1301  VITAMINB12  --   --  205  --   FOLATE  --  7.1  --   --   FERRITIN  --   --   --  9*  TIBC  --  365  --   --   IRON  --  16*  --   --   RETICCTPCT 2.2  --   --   --    Sepsis Labs: Recent Labs  Lab 08/20/23 1321 08/20/23 1909 08/20/23 2348  PROCALCITON <0.10  --   --   LATICACIDVEN  --  1.1 0.8   Recent Results (from the past 240 hour(s))  Blood culture (routine x 2)     Status: None (Preliminary result)   Collection Time: 08/20/23  5:09 PM   Specimen: BLOOD  Result Value Ref Range Status   Specimen Description BLOOD BLOOD RIGHT FOREARM  Final   Special Requests   Final    BOTTLES DRAWN AEROBIC AND ANAEROBIC Blood Culture results may not be optimal due to an inadequate volume of blood received in culture bottles   Culture   Final    NO GROWTH 3 DAYS Performed at  [facility], [address] [place], [place] [address]    Report Status PENDING  Incomplete  SARS Coronavirus 2 by RT PCR (hospital order, performed in [facility] hospital lab) *cepheid single result test* Anterior Nasal Swab     Status: None   Collection Time: 08/20/23  5:09 PM   Specimen: Anterior Nasal Swab  Result Value Ref Range Status   SARS Coronavirus 2 by RT PCR NEGATIVE NEGATIVE Final    Comment: (NOTE) SARS-CoV-2 target nucleic acids are NOT DETECTED.  The SARS-CoV-2 RNA is generally detectable in upper and lower respiratory specimens during the acute phase of infection. The lowest concentration of SARS-CoV-2 viral copies this assay can detect is 250 copies / mL. A negative result does not preclude SARS-CoV-2 infection and should not be used as the sole basis for treatment or other patient management decisions.  A negative result may occur with improper specimen collection / handling, submission of specimen other than nasopharyngeal swab, presence of viral mutation(s) within the areas targeted by this assay, and inadequate number of viral copies (<250 copies / mL). A negative result must be combined with clinical observations, patient history, and epidemiological information.  Fact Sheet for Patients:   RoadLapTop.co.za  Fact Sheet for Healthcare Providers: http://kim-miller.com/  This test is not yet approved or  cleared by the [place] FDA and has been authorized for detection and/or diagnosis of SARS-CoV-2 by FDA under an Emergency Use Authorization (EUA).  This EUA will remain in effect (meaning this test can be used) for the duration of the COVID-19 declaration under Section 564(b)(1) of the Act,  21 U.S.C. section 360bbb-3(b)(1), unless the authorization is terminated or revoked sooner.  Performed at [facility], [address] [place], [place] [address]   Blood culture (routine x 2)      Status: None (Preliminary result)   Collection Time: 08/20/23  7:09 PM   Specimen: BLOOD  Result Value Ref Range Status   Specimen Description BLOOD LEFT ANTECUBITAL  Final   Special Requests   Final    BOTTLES DRAWN AEROBIC AND ANAEROBIC Blood Culture adequate volume   Culture   Final    NO GROWTH 3 DAYS Performed at [facility], [address], [place], [place] [address]    Report Status PENDING  Incomplete  Expectorated Sputum Assessment w Gram Stain, Rflx to Resp Cult     Status: None   Collection Time: 08/20/23 11:48 PM   Specimen: Sputum  Result Value Ref Range Status   Specimen Description SPUTUM  Final   Special Requests NONE  Final   Sputum evaluation   Final    THIS SPECIMEN IS ACCEPTABLE FOR SPUTUM CULTURE Performed at [facility], [address], [place], [place] [address]    Report Status 08/21/2023 FINAL  Final  Culture, Respiratory w Gram Stain     Status: None (Preliminary result)   Collection Time: 08/20/23 11:48 PM   Specimen: SPU  Result Value Ref Range Status   Specimen Description   Final    SPUTUM Performed at [facility], [address] [place], [place] [address]    Special Requests   Final    NONE Reflexed from [id number] Performed at [facility], [address] [place], [place] [address]    Gram Stain   Final    FEW SQUAMOUS EPITHELIAL CELLS PRESENT NO WBC SEEN RARE GRAM POSITIVE COCCI    Culture   Final    CULTURE REINCUBATED FOR BETTER GROWTH Performed at [facility], [address], [place], [place] [address]    Report Status PENDING  Incomplete    Radiology Studies: No results found.  Scheduled Meds:  atorvastatin  20 mg Oral Daily   bisacodyl  10 mg Oral QHS   cyanocobalamin  1,000 mcg Oral Daily   folic acid  1 mg Oral Daily   gabapentin  600 mg Oral QHS   mometasone-formoterol  2 puff Inhalation BID   pantoprazole (PROTONIX) IV  40 mg Intravenous Q12H   polyethylene glycol  17 g  Oral BID   Vitamin D (Ergocalciferol)  50,000 Units Oral Q7 days   Continuous Infusions:  sodium chloride 40 mL/hr at 08/22/23 2140   sodium chloride      LOS: 0 days   Marguerita Merles, DO [facility] Available via Epic secure chat 7am-7pm After these hours, please refer to coverage provider listed on [email or web address] 08/23/2023, 10:22 AM

## 2023-08-23 NOTE — Progress Notes (Signed)
Patient is alert and oriented X 2. Pt is angry and asking food. This RN educated her about the importance of NPO till 4;30. But stated that I do not care and just want to eat, I have not eaten anything since last night and also she throw her stuff.MD made aware. Received new order.

## 2023-08-23 NOTE — Transfer of Care (Signed)
Immediate Anesthesia Transfer of Care Note  Patient: SHIVANI CALISTO  Procedure(s) Performed: ESOPHAGOGASTRODUODENOSCOPY (EGD) COLONOSCOPY WITH PROPOFOL GIVENS CAPSULE STUDY  Patient Location: PACU  Anesthesia Type:General  Level of Consciousness: awake, alert , and oriented  Airway & Oxygen Therapy: Patient Spontanous Breathing  Post-op Assessment: Report given to RN and Post -op Vital signs reviewed and stable  Post vital signs: Reviewed and stable  Last Vitals:  Vitals Value Taken Time  BP    Temp    Pulse 71 08/23/23 1230  Resp 18 08/23/23 1230  SpO2 96 % 08/23/23 1230  Vitals shown include unfiled device data.  Last Pain:  Vitals:   08/23/23 1128  TempSrc: Temporal  PainSc: 0-No pain         Complications: No notable events documented.

## 2023-08-23 NOTE — Anesthesia Preprocedure Evaluation (Signed)
Anesthesia Evaluation  Patient identified by MRN, date of birth, ID band Patient awake    Reviewed: Allergy & Precautions, NPO status , Patient's Chart, lab work & pertinent test results  History of Anesthesia Complications Negative for: history of anesthetic complications  Airway Mallampati: III  TM Distance: <3 FB Neck ROM: full    Dental  (+) Chipped, Poor Dentition, Missing, Dental Advidsory Given   Pulmonary neg shortness of breath, COPD, former smoker   Pulmonary exam normal        Cardiovascular Exercise Tolerance: Good hypertension, (-) angina (-) Past MI + dysrhythmias Atrial Fibrillation      Neuro/Psych  PSYCHIATRIC DISORDERS      negative neurological ROS     GI/Hepatic Neg liver ROS,GERD  Controlled,,  Endo/Other  diabetes, Type 2    Renal/GU Renal disease  negative genitourinary   Musculoskeletal   Abdominal   Peds  Hematology negative hematology ROS (+)   Anesthesia Other Findings Past Medical History: No date: Cancer     Comment:  Breast and lung No date: Diabetes mellitus without complication No date: GERD (gastroesophageal reflux disease) No date: Heart disease No date: Hypertension  Past Surgical History: No date: ABDOMINAL HYSTERECTOMY No date: APPENDECTOMY No date: BREAST SURGERY     Comment:  breast cancer LEFT 02/03/2018: OPEN REDUCTION INTERNAL FIXATION (ORIF) TIBIA/FIBULA  FRACTURE; Left     Comment:  Procedure: OPEN REDUCTION INTERNAL FIXATION (ORIF)               DISTAL TIBIA FRACTURE;  Surgeon: Christena Flake, MD;                Location: [facility] ORS;  Service: Orthopedics;  Laterality:               Left;  ankle/lower leg No date: TONSILLECTOMY  BMI    Body Mass Index: 27.40 kg/m      Reproductive/Obstetrics negative OB ROS                             Anesthesia Physical Anesthesia Plan  ASA: 3  Anesthesia Plan: General   Post-op Pain  Management:    Induction: Intravenous  PONV Risk Score and Plan: 3 and Propofol infusion and TIVA  Airway Management Planned: Natural Airway and Nasal Cannula  Additional Equipment:   Intra-op Plan:   Post-operative Plan:   Informed Consent: I have reviewed the patients History and Physical, chart, labs and discussed the procedure including the risks, benefits and alternatives for the proposed anesthesia with the patient or authorized representative who has indicated his/her understanding and acceptance.     Dental Advisory Given  Plan Discussed with: Anesthesiologist, CRNA and Surgeon  Anesthesia Plan Comments: (Patient consented for risks of anesthesia including but not limited to:  - adverse reactions to medications - risk of airway placement if required - damage to eyes, teeth, lips or other oral mucosa - nerve damage due to positioning  - sore throat or hoarseness - Damage to heart, brain, nerves, lungs, other parts of body or loss of life  Patient voiced understanding.)       Anesthesia Quick Evaluation

## 2023-08-23 NOTE — Progress Notes (Signed)
Pt sent for procedure via bed in stable condition.

## 2023-08-23 NOTE — Plan of Care (Signed)

## 2023-08-23 NOTE — Anesthesia Postprocedure Evaluation (Signed)
Anesthesia Post Note  Patient: Tracy Dickerson  Procedure(s) Performed: ESOPHAGOGASTRODUODENOSCOPY (EGD) COLONOSCOPY WITH PROPOFOL GIVENS CAPSULE STUDY  Patient location during evaluation: PACU Anesthesia Type: General Level of consciousness: awake and alert Pain management: pain level controlled Vital Signs Assessment: post-procedure vital signs reviewed and stable Respiratory status: spontaneous breathing, nonlabored ventilation, respiratory function stable and patient connected to nasal cannula oxygen Cardiovascular status: blood pressure returned to baseline and stable Postop Assessment: no apparent nausea or vomiting Anesthetic complications: no  No notable events documented.   Last Vitals:  Vitals:   08/23/23 1240 08/23/23 1613  BP: (!) 144/75 (!) 152/78  Pulse:  80  Resp:  18  Temp:  (!) 36.3 C  SpO2:  93%    Last Pain:  Vitals:   08/23/23 1613  TempSrc: Oral  PainSc:                  Stephanie Coup

## 2023-08-24 DIAGNOSIS — D649 Anemia, unspecified: Secondary | ICD-10-CM | POA: Diagnosis not present

## 2023-08-24 DIAGNOSIS — J449 Chronic obstructive pulmonary disease, unspecified: Secondary | ICD-10-CM | POA: Diagnosis not present

## 2023-08-24 DIAGNOSIS — R55 Syncope and collapse: Secondary | ICD-10-CM | POA: Diagnosis not present

## 2023-08-24 DIAGNOSIS — K562 Volvulus: Secondary | ICD-10-CM | POA: Diagnosis not present

## 2023-08-24 DIAGNOSIS — D509 Iron deficiency anemia, unspecified: Secondary | ICD-10-CM | POA: Diagnosis not present

## 2023-08-24 DIAGNOSIS — E876 Hypokalemia: Secondary | ICD-10-CM | POA: Diagnosis not present

## 2023-08-24 LAB — BASIC METABOLIC PANEL
Anion gap: 7 (ref 5–15)
BUN: 9 mg/dL (ref 8–23)
CO2: 25 mmol/L (ref 22–32)
Calcium: 8.6 mg/dL — ABNORMAL LOW (ref 8.9–10.3)
Chloride: 108 mmol/L (ref 98–111)
Creatinine, Ser: 1.21 mg/dL — ABNORMAL HIGH (ref 0.44–1.00)
GFR, Estimated: 45 mL/min — ABNORMAL LOW (ref >60)
Glucose, Bld: 87 mg/dL (ref 70–99)
Potassium: 4.1 mmol/L (ref 3.5–5.1)
Sodium: 140 mmol/L (ref 135–145)

## 2023-08-24 LAB — GLUCOSE, CAPILLARY: Glucose-Capillary: 91 mg/dL (ref 70–99)

## 2023-08-24 LAB — CBC
HCT: 26.6 % — ABNORMAL LOW (ref 36.0–46.0)
Hemoglobin: 8.3 g/dL — ABNORMAL LOW (ref 12.0–15.0)
MCH: 26.6 pg (ref 26.0–34.0)
MCHC: 31.2 g/dL (ref 30.0–36.0)
MCV: 85.3 fL (ref 80.0–100.0)
Platelets: 204 10*3/uL (ref 150–400)
RBC: 3.12 MIL/uL — ABNORMAL LOW (ref 3.87–5.11)
RDW: 16.5 % — ABNORMAL HIGH (ref 11.5–15.5)
WBC: 9 10*3/uL (ref 4.0–10.5)
nRBC: 0 % (ref 0.0–0.2)

## 2023-08-24 MED ORDER — APIXABAN 5 MG PO TABS
5.0000 mg | ORAL_TABLET | Freq: Two times a day (BID) | ORAL | Status: DC
  Administered 2023-08-24 – 2023-09-03 (×21): 5 mg via ORAL
  Filled 2023-08-24 (×21): qty 1

## 2023-08-24 MED ORDER — SODIUM CHLORIDE 0.9 % IV BOLUS
500.0000 mL | Freq: Once | INTRAVENOUS | Status: AC
  Administered 2023-08-24: 500 mL via INTRAVENOUS

## 2023-08-24 NOTE — Progress Notes (Signed)
PROGRESS NOTE    Tracy Dickerson  XLK:[id number] DOB: 10/06/1940 DOA: 08/20/2023 PCP: Dwana Melena, PA   Brief Narrative:  HPI per Dr. Lorretta Harp on 08/20/23  Tracy Dickerson is a 82 y.o. female with medical history significant of hypertension, hyperlipidemia, diabetes mellitus, GERD, breast cancer, CKD-3a, septic shock due to UTI, PAF on Eliquis, who presents with generalized weakness, syncope, SOB and cough.   Pt states that over the last 2 weeks, she has had progressive worsening generalized weakness. She has dizziness and lightheadedness. She passed out twice today.  No unilateral numbness or tinglings in extremities.  No facial droop or slurred speech.  She has dry cough and shortness of breath.  She had some chest pain in the morning, which has resolved.  No fever or chills.  No nausea, vomiting, diarrhea or abdominal pain.  No symptoms of UTI.  Patient states that she ran out of her medications for more than 2 weeks.  She did not take her medications in the past 2 weeks, including Eliquis.  Per ED RN, pt was found to bed bugs.     Data reviewed independently and ED Course: pt was found to have WBC 7.7, trop 12, D-dimer 1.85, BNP 233, stable renal function, potassium 3.1, negative COVID PCR, temperature normal, blood pressure 110/59, heart rate 68, RR 20, oxygen saturation 95% on room air.  CT of the head and neck is negative for acute injury.  Patient is placed on telemetry bed for observation.   Chest x-ray: Slight increase in lung base opacities. Subtle infiltrate is possible. Recommend follow-up.   Underlying fibrotic, interstitial changes again seen of the lungs. Please correlate with the history. Elevation of the right hemidiaphragm   **Interim History Since Eliquis has been held given her drop in hemoglobin.  She was typed and screened and transfused 1 unit PRBCs.  Continues to be significant orthostatic.  GI is planning for an EGD and colonoscopy with possible video capsule  endoscopy and they were done.  EGD was normal and colonoscopy was a very poor prep.  She underwent a VCE and the study was incomplete and there is unclear if the capsule reached which the cecum given that there is digested material in the entire small bowel.  The study was suboptimal but there was no visible blood in the small bowel.  GI recommended resuming anticoagulation at this time with outpatient GI follow-up and repeating VCE and following up with hematology for IV iron infusion referrals.  Assessment and Plan:  Syncope -Etiology is not clear but likely in the setting of Anemia.  -Patient reports shortness of breath and had some chest pain earlier, D-dimer +1.85, will need to rule out PE.   -Other differential diagnosis include orthostatic status and vasovagal syncope.   -No focal neurodeficit on physical examination.   -CT head negative.  Low suspicions for stroke. -Orthostatic vital signs Positive previously and will need to be repeated -CTA negative for PE, and LE doppler negative for DVT -Neuro checks  -IVF now stopped -PT/OT eval and treat and recommending SNF but currently does not have any SNF Bed offers at this time.  -See below   Orthostatic Hypotension -In the setting of Anemia and ? Bleeding -Gave 500 mL bolus she is s/p Maintenance IVF with NS @ 40 mL/hr x 1 day -TED hose -PT/OT to evaluate and treat and recommending SNF but currently does not have any SNF offers at this time -Repeat Orthostatic VS today   HTN (  Hypertension) -Hold Lisinopril due to softer blood pressure -IV hydralazine as needed -Continue to Monitor BP per Protocol -Last BP reading was 125/60   COPD (chronic obstructive pulmonary disease)  -Patient reports dry cough, shortness of breath, no wheezing on auscultation.   -Does not seem to have COPD exacerbation.   -Chest x-ray showed some basilar opacity, but the patient does not have fever or leukocytosis. Procalcitonin < 0.10.  -Clinically does not  seem to have pneumonia.   -Patient received 1 dose of Rocephin and azithromycin in ED, will Discontinue antibiotics. SpO2: 95 % -C/w Bronchodilators -C/w Dextromethorphan-Guaifenesin 30-600 mg po BID prn Cough  Vitamin D Deficiency -Vitamin D, 25-Hydroxy was 17.29 -Start Supplementation with Vitamin D 50,000 units weekly   PAF (paroxysmal atrial fibrillation) (HCC) -Was Holding Eliquis due to low Hb but now will resume as it is ok with GI -Continue to Monitor on Telemetry    Type II Diabetes Mellitus with Renal Manifestations (HCC):  -Recent A1c 5.9, well-controlled.  Patient seem to be not taking medications. -Check blood sugar every morning -CBG Trend: Recent Labs  Lab 08/21/23 0853 08/22/23 0759 08/24/23 0909  GLUCAP 82 78 91   Symptomatic Normocytic Anemia/ ? 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 -Hgb/Hct Trend: Recent Labs  Lab 08/11/23 1625 08/20/23 1321 08/21/23 0528 08/21/23 1608 08/22/23 0358 08/23/23 0455 08/24/23 0413  HGB 8.1* 8.1* 6.3* 8.3* 7.9* 8.3* 8.3*  HCT 23.6* 26.3* 22.1* 26.3* 23.1* 24.0* 26.6*  MCV 90.4 85.9 83.7  --  84.9 89.9 85.3  -Iron Panel was checked and iron level 16, UIBC of 349, TIBC of 365, saturation ratios of 4%, folate level 7.1 and vitamin B12 level 205 -Continuing IV PPI with Pantoprazole 40 mg BID -Check FOBT and was Negative  -GI Recommending Parenteral Iron while Inpatient and oral Iron supplementation as an outpatient and will need to follow up with Oncology  -Held Anticoagulation but now GI recommending to restart today so have resumed AC with Apixaban 5 mg po BID -Continue to Monitor for S/Sx of Bleeding -GI Planning on EGD and Colonoscopy with possible video capsule endoscopy. -EGD was normal with esophagus and stomach and entire duodenum being normal.  Colonoscopy was done and the perianal and digital rectal examinations were normal and pertinent negatives included normal sphincter tone and no palpable  rectal lesions there is copious quantities of semiliquid semisolid stool found in the entire colon which precluded her visualization and a retroflexed view of the distal rectum and anal verge were normal -Patient ended up getting a video capsule endoscopy but the study was incomplete and is unclear if the capsule reached the cecum given that there is digested material in the entire small bowel.  This study was suboptimal but there is no visual blood in the small bowel and GI recommending repeating the study in the outpatient setting -Given this GI has not recommended resuming anticoagulation from a GI standpoint and following up in outpatient with referral to hematology for iron infusions   CKD Stage 3a -Relatively stable. BUN/Cr Trend: Recent Labs  Lab 08/11/23 1625 08/20/23 1321 08/21/23 0528 08/22/23 0358 08/23/23 0455 08/24/23 0413  BUN 13 11 11 12 10 9   CREATININE 1.04* 1.03* 1.00 1.24* 1.01* 1.21*  -Gave her a 500 mL bolus the day before ; Maintenance IVF with NS at 40 mL/hr now stopped -Avoid Nephrotoxic Medications, Contrast Dyes, Hypotension and Dehydration to Ensure Adequate Renal Perfusion and will need to Renally Adjust Meds -Continue to Monitor and Trend Renal  Function carefully and repeat CMP in the AM   Hypokalemia -Patient's K+ Level Trend: Recent Labs  Lab 08/11/23 1625 08/20/23 1321 08/21/23 0528 08/22/23 0358 08/23/23 0455 08/24/23 0413  K 3.6 3.1* 4.0 4.0 3.6 4.1  -Continue to Monitor and Replete as Necessary -Repeat CMP in the AM   Hypoalbuminemia -Patient's Albumin Trend: Recent Labs  Lab 08/11/23 1625 08/21/23 0839 08/23/23 0455  ALBUMIN 3.6 2.7* 2.9*  -Continue to Monitor and Trend and repeat CMP in the AM  Obesity -Complicates overall prognosis and care -Estimated body mass index is 32.28 kg/m as calculated from the following:   Height as of this encounter: 5\' 6"  (1.676 m).   Weight as of this encounter: 90.7 kg.  -Weight Loss and Dietary  Counseling given   DVT prophylaxis: Place TED hose Start: 08/22/23 1555 Place and maintain sequential compression device Start: 08/21/23 1155 apixaban (ELIQUIS) tablet 5 mg    Code Status: Full Code Family Communication: No family present at bedside   Disposition Plan:  Level of care: Telemetry Medical Status is: Observation The patient will require care spanning > 2 midnights and should be moved to inpatient because: Finally has GI Clearance but underwent whole workup and now needs SNF. Will need to check if she is orthostatic again.  Consultants:  Gastroenterology  Procedures:  EGD Findings:      The esophagus was normal.      The stomach was normal.      The examined duodenum was normal. Impression:            - Normal esophagus.                        - Normal stomach.                        - Normal examined duodenum.                        - No specimens collected. Recommendation:        - Proceed with colonoscopy as scheduled                        See colonoscopy report  COLONOSCOPY Findings:      The perianal and digital rectal examinations were normal. Pertinent       negatives include normal sphincter tone and no palpable rectal lesions.      Copious quantities of semi-liquid semi-solid stool was found in the       entire colon, precluding visualization.      The retroflexed view of the distal rectum and anal verge was normal and       showed no anal or rectal abnormalities. Impression:            - Preparation of the colon was poor.                        - Stool in the entire examined colon.                        - The distal rectum and anal verge are normal on                         retroflexion view.                        -  No specimens collected. Recommendation:        - Return patient to hospital ward for ongoing care.                        - Resume previous diet today.                        - To visualize the small bowel, perform video capsule                          endoscopy today.  VCE  VCE report   Study is incomplete Unclear if capsule reached cecum Digested material in entire small bowel   Suboptimal study but no visible blood in the small bowel Repeat study as outpt Ok to resume eliquis from GI standpoint Follow up with GI as outpt Recommend hematology referral for IV iron as outpt GI will sign off at this time, please call us with questions/concerns   Antimicrobials:  Anti-infectives (From admission, onward)    Start     Dose/Rate Route Frequency Ordered Stop   08/20/23 1715  cefTRIAXone (ROCEPHIN) 2 g in sodium chloride 0.9 % 100 mL IVPB        2 g 200 mL/hr over 30 Minutes Intravenous  Once 08/20/23 1710 08/20/23 2144   08/20/23 1715  azithromycin (ZITHROMAX) 500 mg in sodium chloride 0.9 % 250 mL IVPB        500 mg 250 mL/hr over 60 Minutes Intravenous  Once 08/20/23 1710 08/21/23 0419       Subjective: Seen and examined at bedside and thinks he is doing okay today.  No nausea or vomiting.  Still feels weak.  States that she has been ambulating to and from the bathroom.  States that she continues to feel slightly dizzy.  No other concerns or complaints this time.  Objective: Vitals:   08/23/23 1951 08/24/23 0422 08/24/23 0910 08/24/23 0911  BP: 135/71 122/60 125/60   Pulse: 80 65 (!) 53   Resp:  17    Temp: 97.8 F (36.6 C) 98 F (36.7 C)  98.1 F (36.7 C)  TempSrc: Oral Oral    SpO2: 97% 94% 95%   Weight:      Height:       No intake or output data in the 24 hours ending 08/24/23 1457 Filed Weights   08/20/23 1319  Weight: 90.7 kg   Examination: Physical Exam:  Constitutional: WN/WD obese Caucasian female in no acute distress appears calm today Respiratory: Diminished to auscultation bilaterally, no wheezing, rales, rhonchi or crackles. Normal respiratory effort and patient is not tachypenic. No accessory muscle use.  Unlabored breathing Cardiovascular: RRR, no murmurs / rubs / gallops. S1  and S2 auscultated.  Minimal extremity edema Abdomen: Soft, non-tender, distended secondary to body habitus.  Bowel sounds positive.  GU: Deferred. Musculoskeletal: No clubbing / cyanosis of digits/nails. No joint deformity upper and lower extremities.  Skin: No rashes, lesions, ulcers on limited skin evaluation. No induration; Warm and dry.  Neurologic: CN 2-12 grossly intact with no focal deficits. Romberg sign and cerebellar reflexes not assessed.  Psychiatric: Normal judgment and insight. Alert and oriented x 3. Normal mood and appropriate affect.   Data Reviewed: I have personally reviewed following labs and imaging studies  CBC: Recent Labs  Lab 08/20/23 1321 08/21/23 0528 08/21/23 1608 08/22/23 0358 08/23/23 0455 08/24/23 0413  WBC 7.7 7.5  --  7.4 6.8 9.0  NEUTROABS  --   --   --   --  4.4  --   HGB 8.1* 6.3* 8.3* 7.9* 8.3* 8.3*  HCT 26.3* 22.1* 26.3* 23.1* 24.0* 26.6*  MCV 85.9 83.7  --  84.9 89.9 85.3  PLT 254 205  --  201 205 204   Basic Metabolic Panel: Recent Labs  Lab 08/20/23 1321 08/21/23 0528 08/22/23 0358 08/23/23 0455 08/24/23 0413  NA 140 140 141 140 140  K 3.1* 4.0 4.0 3.6 4.1  CL 107 109 109 110 108  CO2 24 25 25 23 25   GLUCOSE 133* 89 86 79 87  BUN 11 11 12 10 9   CREATININE 1.03* 1.00 1.24* 1.01* 1.21*  CALCIUM 8.6* 8.0* 8.4* 8.1* 8.6*  MG 2.3  --  2.3 2.3  --   PHOS 2.7  --  3.3 3.2  --    GFR: Estimated Creatinine Clearance: 42.1 mL/min (A) (by C-G formula based on SCr of 1.21 mg/dL (H)). Liver Function Tests: Recent Labs  Lab 08/21/23 0839 08/23/23 0455  AST 16 16  ALT 8 9  ALKPHOS 61 66  BILITOT 0.4 0.4  PROT 5.9* 5.8*  ALBUMIN 2.7* 2.9*   No results for input(s): "LIPASE", "AMYLASE" in the last 168 hours. No results for input(s): "AMMONIA" in the last 168 hours. Coagulation Profile: No results for input(s): "INR", "PROTIME" in the last 168 hours. Cardiac Enzymes: Recent Labs  Lab 08/21/23 0839  CKTOTAL 20*   BNP (last 3  results) No results for input(s): "PROBNP" in the last 8760 hours. HbA1C: No results for input(s): "HGBA1C" in the last 72 hours. CBG: Recent Labs  Lab 08/21/23 0853 08/22/23 0759 08/24/23 0909  GLUCAP 82 78 91   Lipid Profile: No results for input(s): "CHOL", "HDL", "LDLCALC", "TRIG", "CHOLHDL", "LDLDIRECT" in the last 72 hours. Thyroid Function Tests: No results for input(s): "TSH", "T4TOTAL", "FREET4", "T3FREE", "THYROIDAB" in the last 72 hours. Anemia Panel: No results for input(s): "VITAMINB12", "FOLATE", "FERRITIN", "TIBC", "IRON", "RETICCTPCT" in the last 72 hours.  Sepsis Labs: Recent Labs  Lab 08/20/23 1321 08/20/23 1909 08/20/23 2348  PROCALCITON <0.10  --   --   LATICACIDVEN  --  1.1 0.8   Recent Results (from the past 240 hour(s))  Blood culture (routine x 2)     Status: None (Preliminary result)   Collection Time: 08/20/23  5:09 PM   Specimen: BLOOD  Result Value Ref Range Status   Specimen Description BLOOD BLOOD RIGHT FOREARM  Final   Special Requests   Final    BOTTLES DRAWN AEROBIC AND ANAEROBIC Blood Culture results may not be optimal due to an inadequate volume of blood received in culture bottles   Culture   Final    NO GROWTH 4 DAYS Performed at [facility], [address], [place], [place] [address]    Report Status PENDING  Incomplete  SARS Coronavirus 2 by RT PCR (hospital order, performed in [facility] hospital lab) *cepheid single result test* Anterior Nasal Swab     Status: None   Collection Time: 08/20/23  5:09 PM   Specimen: Anterior Nasal Swab  Result Value Ref Range Status   SARS Coronavirus 2 by RT PCR NEGATIVE NEGATIVE Final    Comment: (NOTE) SARS-CoV-2 target nucleic acids are NOT DETECTED.  The SARS-CoV-2 RNA is generally detectable in upper and lower respiratory specimens during the acute phase of infection. The lowest concentration of SARS-CoV-2 viral copies this assay can detect is  250 copies / mL. A negative  result does not preclude SARS-CoV-2 infection and should not be used as the sole basis for treatment or other patient management decisions.  A negative result may occur with improper specimen collection / handling, submission of specimen other than nasopharyngeal swab, presence of viral mutation(s) within the areas targeted by this assay, and inadequate number of viral copies (<250 copies / mL). A negative result must be combined with clinical observations, patient history, and epidemiological information.  Fact Sheet for Patients:   RoadLapTop.co.za  Fact Sheet for Healthcare Providers: http://kim-miller.com/  This test is not yet approved or  cleared by the [place] FDA and has been authorized for detection and/or diagnosis of SARS-CoV-2 by FDA under an Emergency Use Authorization (EUA).  This EUA will remain in effect (meaning this test can be used) for the duration of the COVID-19 declaration under Section 564(b)(1) of the Act, 21 U.S.C. section 360bbb-3(b)(1), unless the authorization is terminated or revoked sooner.  Performed at [facility], [address] [place], [place] [address]   Blood culture (routine x 2)     Status: None (Preliminary result)   Collection Time: 08/20/23  7:09 PM   Specimen: BLOOD  Result Value Ref Range Status   Specimen Description BLOOD LEFT ANTECUBITAL  Final   Special Requests   Final    BOTTLES DRAWN AEROBIC AND ANAEROBIC Blood Culture adequate volume   Culture   Final    NO GROWTH 4 DAYS Performed at [facility], [address], [place], [place] [address]    Report Status PENDING  Incomplete  Expectorated Sputum Assessment w Gram Stain, Rflx to Resp Cult     Status: None   Collection Time: 08/20/23 11:48 PM   Specimen: Sputum  Result Value Ref Range Status   Specimen Description SPUTUM  Final   Special Requests NONE  Final   Sputum evaluation   Final    THIS  SPECIMEN IS ACCEPTABLE FOR SPUTUM CULTURE Performed at [facility], [address], [place], [place] [address]    Report Status 08/21/2023 FINAL  Final  Culture, Respiratory w Gram Stain     Status: None (Preliminary result)   Collection Time: 08/20/23 11:48 PM   Specimen: SPU  Result Value Ref Range Status   Specimen Description   Final    SPUTUM Performed at [facility], [address], [place], [place] [address]    Special Requests   Final    NONE Reflexed from [id number] Performed at [facility], [address] [place], [place] [address]    Gram Stain   Final    FEW SQUAMOUS EPITHELIAL CELLS PRESENT NO WBC SEEN RARE GRAM POSITIVE COCCI    Culture   Final    MODERATE GRAM NEGATIVE RODS CULTURE REINCUBATED FOR BETTER GROWTH SUSCEPTIBILITIES TO FOLLOW Performed at [facility], [address], [place], [place] [address]    Report Status PENDING  Incomplete    Radiology Studies: No results found.  Scheduled Meds:  apixaban  5 mg Oral BID   atorvastatin  20 mg Oral Daily   bisacodyl  10 mg Oral QHS   cyanocobalamin  1,000 mcg Oral Daily   folic acid  1 mg Oral Daily   gabapentin  600 mg Oral QHS   mometasone-formoterol  2 puff Inhalation BID   pantoprazole (PROTONIX) IV  40 mg Intravenous Q12H   polyethylene glycol  17 g Oral BID   Vitamin D (  Ergocalciferol)  50,000 Units Oral Q7 days   Continuous Infusions:   LOS: 0 days   Marguerita Merles, DO [facility] Available via Epic secure chat 7am-7pm After these hours, please refer to coverage provider listed on [email or web address] 08/24/2023, 2:57 PM

## 2023-08-24 NOTE — Plan of Care (Addendum)
Patient is alert and oriented X3. Denies any pain.   Problem: Education: Goal: Knowledge of General Education information will improve Description: Including pain rating scale, medication(s)/side effects and non-pharmacologic comfort measures Outcome: Progressing   Problem: Health Behavior/Discharge Planning: Goal: Ability to manage health-related needs will improve Outcome: Progressing   Problem: Clinical Measurements: Goal: Ability to maintain clinical measurements within normal limits will improve Outcome: Progressing Goal: Will remain free from infection Outcome: Progressing Goal: Diagnostic test results will improve Outcome: Progressing Goal: Respiratory complications will improve Outcome: Progressing Goal: Cardiovascular complication will be avoided Outcome: Progressing   Problem: Activity: Goal: Risk for activity intolerance will decrease Outcome: Progressing   Problem: Nutrition: Goal: Adequate nutrition will be maintained Outcome: Progressing   Problem: Coping: Goal: Level of anxiety will decrease Outcome: Progressing   Problem: Elimination: Goal: Will not experience complications related to bowel motility Outcome: Progressing Goal: Will not experience complications related to urinary retention Outcome: Progressing   Problem: Pain Management: Goal: General experience of comfort will improve Outcome: Progressing   Problem: Safety: Goal: Ability to remain free from injury will improve Outcome: Progressing   Problem: Skin Integrity: Goal: Risk for impaired skin integrity will decrease Outcome: Progressing   Problem: Education: Goal: Knowledge of disease or condition will improve Outcome: Progressing Goal: Knowledge of the prescribed therapeutic regimen will improve Outcome: Progressing Goal: Individualized Educational Video(s) Outcome: Progressing   Problem: Activity: Goal: Ability to tolerate increased activity will improve Outcome:  Progressing Goal: Will verbalize the importance of balancing activity with adequate rest periods Outcome: Progressing   Problem: Respiratory: Goal: Ability to maintain a clear airway will improve Outcome: Progressing Goal: Levels of oxygenation will improve Outcome: Progressing Goal: Ability to maintain adequate ventilation will improve Outcome: Progressing   Problem: Activity: Goal: Ability to tolerate increased activity will improve Outcome: Progressing   Problem: Clinical Measurements: Goal: Ability to maintain a body temperature in the normal range will improve Outcome: Progressing   Problem: Respiratory: Goal: Ability to maintain adequate ventilation will improve Outcome: Progressing Goal: Ability to maintain a clear airway will improve Outcome: Progressing

## 2023-08-24 NOTE — Progress Notes (Signed)
VCE report  Study is incomplete Unclear if capsule reached cecum Digested material in entire small bowel  Suboptimal study but no visible blood in the small bowel Repeat study as outpt Ok to resume eliquis from GI standpoint Follow up with GI as outpt Recommend hematology referral for IV iron as outpt GI will sign off at this time, please call us with questions/concerns  [name], MD [facility], [facility] [address]  [address]  [place], [place] [address]  Main: [phone]  Fax: [phone] Pager: [phone]

## 2023-08-24 NOTE — Plan of Care (Signed)
  Problem: Education: Goal: Knowledge of General Education information will improve Description: Including pain rating scale, medication(s)/side effects and non-pharmacologic comfort measures Outcome: Progressing   Problem: Clinical Measurements: Goal: Ability to maintain clinical measurements within normal limits will improve Outcome: Progressing Goal: Will remain free from infection Outcome: Progressing Goal: Diagnostic test results will improve Outcome: Progressing Goal: Respiratory complications will improve Outcome: Progressing Goal: Cardiovascular complication will be avoided Outcome: Progressing   Problem: Nutrition: Goal: Adequate nutrition will be maintained Outcome: Progressing   Problem: Coping: Goal: Level of anxiety will decrease Outcome: Progressing   Problem: Elimination: Goal: Will not experience complications related to bowel motility Outcome: Progressing Goal: Will not experience complications related to urinary retention Outcome: Progressing   

## 2023-08-25 ENCOUNTER — Observation Stay: Payer: 59

## 2023-08-25 ENCOUNTER — Encounter: Payer: Self-pay | Admitting: Gastroenterology

## 2023-08-25 DIAGNOSIS — E785 Hyperlipidemia, unspecified: Secondary | ICD-10-CM | POA: Diagnosis present

## 2023-08-25 DIAGNOSIS — Z1152 Encounter for screening for COVID-19: Secondary | ICD-10-CM | POA: Diagnosis not present

## 2023-08-25 DIAGNOSIS — I951 Orthostatic hypotension: Secondary | ICD-10-CM | POA: Diagnosis present

## 2023-08-25 DIAGNOSIS — Z6833 Body mass index (BMI) 33.0-33.9, adult: Secondary | ICD-10-CM | POA: Diagnosis not present

## 2023-08-25 DIAGNOSIS — R42 Dizziness and giddiness: Secondary | ICD-10-CM

## 2023-08-25 DIAGNOSIS — Z7901 Long term (current) use of anticoagulants: Secondary | ICD-10-CM | POA: Diagnosis not present

## 2023-08-25 DIAGNOSIS — B888 Other specified infestations: Secondary | ICD-10-CM | POA: Diagnosis present

## 2023-08-25 DIAGNOSIS — J439 Emphysema, unspecified: Secondary | ICD-10-CM | POA: Diagnosis not present

## 2023-08-25 DIAGNOSIS — N1831 Chronic kidney disease, stage 3a: Secondary | ICD-10-CM | POA: Diagnosis present

## 2023-08-25 DIAGNOSIS — I10 Essential (primary) hypertension: Secondary | ICD-10-CM | POA: Diagnosis not present

## 2023-08-25 DIAGNOSIS — J449 Chronic obstructive pulmonary disease, unspecified: Secondary | ICD-10-CM | POA: Diagnosis not present

## 2023-08-25 DIAGNOSIS — E8809 Other disorders of plasma-protein metabolism, not elsewhere classified: Secondary | ICD-10-CM | POA: Diagnosis present

## 2023-08-25 DIAGNOSIS — D62 Acute posthemorrhagic anemia: Secondary | ICD-10-CM | POA: Diagnosis present

## 2023-08-25 DIAGNOSIS — J438 Other emphysema: Secondary | ICD-10-CM | POA: Diagnosis not present

## 2023-08-25 DIAGNOSIS — J440 Chronic obstructive pulmonary disease with acute lower respiratory infection: Secondary | ICD-10-CM | POA: Diagnosis present

## 2023-08-25 DIAGNOSIS — E876 Hypokalemia: Secondary | ICD-10-CM | POA: Diagnosis present

## 2023-08-25 DIAGNOSIS — R531 Weakness: Secondary | ICD-10-CM | POA: Diagnosis not present

## 2023-08-25 DIAGNOSIS — I480 Paroxysmal atrial fibrillation: Secondary | ICD-10-CM | POA: Diagnosis present

## 2023-08-25 DIAGNOSIS — R55 Syncope and collapse: Secondary | ICD-10-CM | POA: Diagnosis present

## 2023-08-25 DIAGNOSIS — J189 Pneumonia, unspecified organism: Secondary | ICD-10-CM | POA: Diagnosis not present

## 2023-08-25 DIAGNOSIS — K219 Gastro-esophageal reflux disease without esophagitis: Secondary | ICD-10-CM | POA: Diagnosis present

## 2023-08-25 DIAGNOSIS — E559 Vitamin D deficiency, unspecified: Secondary | ICD-10-CM | POA: Diagnosis present

## 2023-08-25 DIAGNOSIS — D649 Anemia, unspecified: Secondary | ICD-10-CM | POA: Diagnosis not present

## 2023-08-25 DIAGNOSIS — R7989 Other specified abnormal findings of blood chemistry: Secondary | ICD-10-CM | POA: Diagnosis not present

## 2023-08-25 DIAGNOSIS — I129 Hypertensive chronic kidney disease with stage 1 through stage 4 chronic kidney disease, or unspecified chronic kidney disease: Secondary | ICD-10-CM | POA: Diagnosis present

## 2023-08-25 DIAGNOSIS — E1122 Type 2 diabetes mellitus with diabetic chronic kidney disease: Secondary | ICD-10-CM | POA: Diagnosis present

## 2023-08-25 DIAGNOSIS — Z23 Encounter for immunization: Secondary | ICD-10-CM | POA: Diagnosis present

## 2023-08-25 DIAGNOSIS — E669 Obesity, unspecified: Secondary | ICD-10-CM | POA: Diagnosis present

## 2023-08-25 LAB — CULTURE, RESPIRATORY W GRAM STAIN

## 2023-08-25 LAB — GLUCOSE, CAPILLARY
Glucose-Capillary: 115 mg/dL — ABNORMAL HIGH (ref 70–99)
Glucose-Capillary: 137 mg/dL — ABNORMAL HIGH (ref 70–99)
Glucose-Capillary: 152 mg/dL — ABNORMAL HIGH (ref 70–99)

## 2023-08-25 LAB — CULTURE, BLOOD (ROUTINE X 2)
Culture: NO GROWTH
Culture: NO GROWTH
Special Requests: ADEQUATE

## 2023-08-25 LAB — CBC WITH DIFFERENTIAL/PLATELET
Abs Immature Granulocytes: 0.15 10*3/uL — ABNORMAL HIGH (ref 0.00–0.07)
Basophils Absolute: 0 10*3/uL (ref 0.0–0.1)
Basophils Relative: 1 %
Eosinophils Absolute: 0.4 10*3/uL (ref 0.0–0.5)
Eosinophils Relative: 5 %
HCT: 23.6 % — ABNORMAL LOW (ref 36.0–46.0)
Hemoglobin: 7.8 g/dL — ABNORMAL LOW (ref 12.0–15.0)
Immature Granulocytes: 2 %
Lymphocytes Relative: 18 %
Lymphs Abs: 1.5 10*3/uL (ref 0.7–4.0)
MCH: 27.6 pg (ref 26.0–34.0)
MCHC: 33.1 g/dL (ref 30.0–36.0)
MCV: 83.4 fL (ref 80.0–100.0)
Monocytes Absolute: 0.6 10*3/uL (ref 0.1–1.0)
Monocytes Relative: 7 %
Neutro Abs: 5.5 10*3/uL (ref 1.7–7.7)
Neutrophils Relative %: 67 %
Platelets: 199 10*3/uL (ref 150–400)
RBC: 2.83 MIL/uL — ABNORMAL LOW (ref 3.87–5.11)
RDW: 16.8 % — ABNORMAL HIGH (ref 11.5–15.5)
WBC: 8.3 10*3/uL (ref 4.0–10.5)
nRBC: 0 % (ref 0.0–0.2)

## 2023-08-25 LAB — ANTI-PARIETAL ANTIBODY: Parietal Cell Antibody-IgG: 29.1 U — ABNORMAL HIGH (ref 0.0–20.0)

## 2023-08-25 LAB — COMPREHENSIVE METABOLIC PANEL
ALT: 8 U/L (ref 0–44)
AST: 13 U/L — ABNORMAL LOW (ref 15–41)
Albumin: 2.9 g/dL — ABNORMAL LOW (ref 3.5–5.0)
Alkaline Phosphatase: 66 U/L (ref 38–126)
Anion gap: 4 — ABNORMAL LOW (ref 5–15)
BUN: 11 mg/dL (ref 8–23)
CO2: 28 mmol/L (ref 22–32)
Calcium: 8.6 mg/dL — ABNORMAL LOW (ref 8.9–10.3)
Chloride: 109 mmol/L (ref 98–111)
Creatinine, Ser: 1.07 mg/dL — ABNORMAL HIGH (ref 0.44–1.00)
GFR, Estimated: 53 mL/min — ABNORMAL LOW (ref >60)
Glucose, Bld: 96 mg/dL (ref 70–99)
Potassium: 3.6 mmol/L (ref 3.5–5.1)
Sodium: 141 mmol/L (ref 135–145)
Total Bilirubin: 0.6 mg/dL (ref <1.2)
Total Protein: 6 g/dL — ABNORMAL LOW (ref 6.5–8.1)

## 2023-08-25 LAB — MAGNESIUM: Magnesium: 2.1 mg/dL (ref 1.7–2.4)

## 2023-08-25 LAB — INTRINSIC FACTOR ANTIBODIES: Intrinsic Factor: 1 [AU]/ml (ref 0.0–1.1)

## 2023-08-25 LAB — PHOSPHORUS: Phosphorus: 3.6 mg/dL (ref 2.5–4.6)

## 2023-08-25 MED ORDER — PANTOPRAZOLE SODIUM 40 MG PO TBEC
40.0000 mg | DELAYED_RELEASE_TABLET | Freq: Two times a day (BID) | ORAL | Status: DC
  Administered 2023-08-25 – 2023-09-03 (×18): 40 mg via ORAL
  Filled 2023-08-25 (×18): qty 1

## 2023-08-25 NOTE — Progress Notes (Signed)
Physical Therapy Treatment Patient Details Name: OTA RACER MRN: [id number] DOB: 02-25-1941 Today's Date: 08/25/2023   History of Present Illness Pt is an 82 y.o. female presenting to hospital 08/20/23 with c/o generalized weakness and fall.  Pt found to have bed bugs.  Pt admitted with syncope, hypokalemia, and sympomatic anemia.  PMH includes htn, HLD, DM, breast CA, L breast surgery, L tib/fib ORIF, CKD, septic shock d/t UTI, PAF.    PT Comments  Pt resting in bed upon PT arrival; agreeable to therapy.  During session pt modified independent with bed mobility; min assist to stand from bed (CGA from toilet using grab bar); and CGA to ambulate 10 feet x2 (to/from bathroom) with RW use.  Limited ambulation distance d/t pt c/o mild dizziness (symptoms resolved once laying back down in bed).  Pt did well tolerating LE ex's in bed to improve LE strength.  Will continue to focus on strengthening and progressive functional mobility per pt tolerance.    If plan is discharge home, recommend the following: A little help with walking and/or transfers;A little help with bathing/dressing/bathroom;Assistance with cooking/housework;Assist for transportation;Help with stairs or ramp for entrance   Can travel by private vehicle        Equipment Recommendations  Rolling walker (2 wheels);BSC/3in1    Recommendations for Other Services       Precautions / Restrictions Precautions Precautions: Fall Precaution Comments: Bed bugs noted in ED Restrictions Weight Bearing Restrictions: No     Mobility  Bed Mobility Overal bed mobility: Modified Independent Bed Mobility: Supine to Sit, Sit to Supine           General bed mobility comments: increased effort to perform on own    Transfers Overall transfer level: Needs assistance Equipment used: Rolling walker (2 wheels) Transfers: Sit to/from Stand Sit to Stand: Min assist           General transfer comment: unable to stand from bed on  own; min assist to stand from bed but CGA to stand from toilet using grab bar; vc's for UE placement    Ambulation/Gait Ambulation/Gait assistance: Contact guard assist Gait Distance (Feet):  (10 feet x2 (to/from bathroom)) Assistive device: Rolling walker (2 wheels) Gait Pattern/deviations: Step-through pattern, Decreased step length - right, Decreased step length - left Gait velocity: decreased     General Gait Details: limited d/t c/o mild dizziness   Stairs             Wheelchair Mobility     Tilt Bed    Modified Rankin (Stroke Patients Only)       Balance Overall balance assessment: Needs assistance Sitting-balance support: No upper extremity supported, Feet supported Sitting balance-Leahy Scale: Good Sitting balance - Comments: steady reaching within BOS   Standing balance support: No upper extremity supported   Standing balance comment: steady washing hands at sink                            Cognition Arousal: Alert Behavior During Therapy: WFL for tasks assessed/performed Overall Cognitive Status: No family/caregiver present to determine baseline cognitive functioning                                 General Comments: A&O to person, hospital, and general situation        Exercises General Exercises - Lower Extremity Ankle Circles/Pumps: AROM, Strengthening, Both, 10 reps,  Supine Quad Sets: AROM, Strengthening, Both, 10 reps, Supine Heel Slides: AROM, Strengthening, Both, 10 reps, Supine Hip ABduction/ADduction: AROM, Strengthening, Both, 10 reps, Supine Straight Leg Raises: AROM, Strengthening, Both, 10 reps, Supine    General Comments Nursing cleared pt for participation in physical therapy.  Pt agreeable to PT session.      Pertinent Vitals/Pain Pain Assessment Pain Assessment: No/denies pain Pain Intervention(s): Limited activity within patient's tolerance, Monitored during session Vitals (HR and SpO2 on room air)  stable and WFL throughout treatment session.    Home Living                          Prior Function            PT Goals (current goals can now be found in the care plan section) Acute Rehab PT Goals Patient Stated Goal: to improve strength and walking PT Goal Formulation: With patient Time For Goal Achievement: 09/05/23 Potential to Achieve Goals: Good Progress towards PT goals: Progressing toward goals    Frequency    Min 1X/week      PT Plan      Co-evaluation              AM-PAC PT "6 Clicks" Mobility   Outcome Measure  Help needed turning from your back to your side while in a flat bed without using bedrails?: None Help needed moving from lying on your back to sitting on the side of a flat bed without using bedrails?: None Help needed moving to and from a bed to a chair (including a wheelchair)?: A Little Help needed standing up from a chair using your arms (e.g., wheelchair or bedside chair)?: A Little Help needed to walk in hospital room?: A Little Help needed climbing 3-5 steps with a railing? : A Lot 6 Click Score: 19    End of Session Equipment Utilized During Treatment: Gait belt Activity Tolerance: Other (comment) (Limited ambulation d/t c/o mild dizziness) Patient left: in bed;with call bell/phone within reach;with bed alarm set Nurse Communication: Mobility status;Precautions PT Visit Diagnosis: Unsteadiness on feet (R26.81);Other abnormalities of gait and mobility (R26.89);Muscle weakness (generalized) (M62.81);History of falling (Z91.81)     Time: 2536-6440 PT Time Calculation (min) (ACUTE ONLY): 14 min  Charges:    $Therapeutic Exercise: 8-22 mins PT General Charges $$ ACUTE PT VISIT: 1 Visit                     Hendricks Limes, PT 08/25/23, 3:56 PM

## 2023-08-25 NOTE — Progress Notes (Signed)
Occupational Therapy Treatment Patient Details Name: Tracy Dickerson MRN: [id number] DOB: [birth year] Today's Date: 08/25/2023   History of present illness Pt is an 82 y.o. female presenting to hospital 08/20/23 with c/o generalized weakness and fall.  Pt found to have bed bugs.  Pt admitted with syncope, hypokalemia, and sympomatic anemia.  PMH includes htn, HLD, DM, breast CA, L breast surgery, L tib/fib ORIF, CKD, septic shock d/t UTI, PAF.   OT comments  Pt is seated on toilet on arrival to room-reports she ambulated on her own with brother present in room. Pleasant and agreeable to OT session. She does not mention pain during session. Pt performed toilet transfer with CGA/SBA use of grab bar, RW. Ambulated back to bed using RW with CGA/SBA. Pt demo all bed mobility for orthostatics to be taken, however were likely inaccurate d/t pt talking and moving UE throughout session. She does not report any dizziness throughout session with standing or mobility. Required Min A x1 to RW for STS from EOB x2 trials. Able to stand and perform functional tasks without UE support with CGA with max standing of x3 mins. Returned to bed d/t social workers coming in to speak with pt regarding home situation. Pt returned to bed with all needs in place and will cont to require skilled acute OT services to maximize her safety and IND to return to PLOF.       If plan is discharge home, recommend the following:  A little help with walking and/or transfers;A lot of help with bathing/dressing/bathroom;Assistance with cooking/housework;Assist for transportation;Help with stairs or ramp for entrance   Equipment Recommendations  BSC/3in1    Recommendations for Other Services      Precautions / Restrictions Precautions Precautions: Fall;Other (comment) Precaution Comments: Bed bugs noted in ED Restrictions Weight Bearing Restrictions: No       Mobility Bed Mobility Overal bed mobility: Modified Independent                   Transfers Overall transfer level: Needs assistance Equipment used: Rolling walker (2 wheels) Transfers: Sit to/from Stand Sit to Stand: Min assist           General transfer comment: Min A for x2 STS during session from EOB to RW     Balance Overall balance assessment: Needs assistance Sitting-balance support: No upper extremity supported, Feet supported Sitting balance-Leahy Scale: Good Sitting balance - Comments: steady reaching within BOS   Standing balance support: Reliant on assistive device for balance, Bilateral upper extremity supported Standing balance-Leahy Scale: Fair Standing balance comment: no LOB in standing holding RW and blowing her nose without BUE support on RW                           ADL either performed or assessed with clinical judgement   ADL Overall ADL's : Needs assistance/impaired                         Toilet Transfer: Contact guard assist;Grab bars;Rolling walker (2 wheels);Regular Toilet   Toileting- Clothing Manipulation and Hygiene: Supervision/safety;Sitting/lateral lean       Functional mobility during ADLs: Contact guard assist;Rolling walker (2 wheels)      Extremity/Trunk Assessment Upper Extremity Assessment Upper Extremity Assessment: Overall WFL for tasks assessed       Cervical / Trunk Assessment Cervical / Trunk Exceptions: forward head/shoulders    Vision  Perception     Praxis      Cognition Arousal: Alert Behavior During Therapy: WFL for tasks assessed/performed Overall Cognitive Status: No family/caregiver present to determine baseline cognitive functioning                                 General Comments: A&O to person, hospital, and general situation        Exercises Other Exercises Other Exercises: Edu on safety with transfers and importance of therapy to maximize her strength and return to PLOF.    Shoulder Instructions       General  Comments no dizziness reported and BP readings not accurate d/t pt talking and moving UE throughout session    Pertinent Vitals/ Pain       Pain Assessment Pain Assessment: Faces Faces Pain Scale: No hurt Pain Intervention(s): Monitored during session, Limited activity within patient's tolerance  Home Living                                          Prior Functioning/Environment              Frequency  Min 1X/week        Progress Toward Goals  OT Goals(current goals can now be found in the care plan section)  Progress towards OT goals: Progressing toward goals  Acute Rehab OT Goals Patient Stated Goal: improve strength OT Goal Formulation: With patient Time For Goal Achievement: 09/05/23 Potential to Achieve Goals: Good  Plan      Co-evaluation                 AM-PAC OT "6 Clicks" Daily Activity     Outcome Measure   Help from another person eating meals?: None Help from another person taking care of personal grooming?: None Help from another person toileting, which includes using toliet, bedpan, or urinal?: A Little Help from another person bathing (including washing, rinsing, drying)?: A Little Help from another person to put on and taking off regular upper body clothing?: None Help from another person to put on and taking off regular lower body clothing?: A Little 6 Click Score: 21    End of Session Equipment Utilized During Treatment: Rolling walker (2 wheels)  OT Visit Diagnosis: Other abnormalities of gait and mobility (R26.89);Muscle weakness (generalized) (M62.81)   Activity Tolerance Patient tolerated treatment well   Patient Left in bed;with call bell/phone within reach;with bed alarm set;with family/visitor present   Nurse Communication Mobility status        Time: 4098-1191 OT Time Calculation (min): 19 min  Charges: OT General Charges $OT Visit: 1 Visit OT Treatments $Therapeutic Activity: 8-22 mins  Nygeria Lager, OTR/L  08/25/23, 12:15 PM   Deryk Bozman E Galen Russman 08/25/2023, 12:12 PM

## 2023-08-25 NOTE — Progress Notes (Signed)
PROGRESS NOTE    Tracy Dickerson  NWG:[id number] DOB: 03-29-1941 DOA: 08/20/2023 PCP: Dwana Melena, PA   Brief Narrative:  HPI per Dr. Lorretta Harp on 08/20/23  Tracy Dickerson is a 82 y.o. female with medical history significant of hypertension, hyperlipidemia, diabetes mellitus, GERD, breast cancer, CKD-3a, septic shock due to UTI, PAF on Eliquis, who presents with generalized weakness, syncope, SOB and cough.   Pt states that over the last 2 weeks, she has had progressive worsening generalized weakness. She has dizziness and lightheadedness. She passed out twice today.  No unilateral numbness or tinglings in extremities.  No facial droop or slurred speech.  She has dry cough and shortness of breath.  She had some chest pain in the morning, which has resolved.  No fever or chills.  No nausea, vomiting, diarrhea or abdominal pain.  No symptoms of UTI.  Patient states that she ran out of her medications for more than 2 weeks.  She did not take her medications in the past 2 weeks, including Eliquis.  Per ED RN, pt was found to bed bugs.    Data reviewed independently and ED Course: pt was found to have WBC 7.7, trop 12, D-dimer 1.85, BNP 233, stable renal function, potassium 3.1, negative COVID PCR, temperature normal, blood pressure 110/59, heart rate 68, RR 20, oxygen saturation 95% on room air.  CT of the head and neck is negative for acute injury.  Patient is placed on telemetry bed for observation.   Chest x-ray: Slight increase in lung base opacities. Subtle infiltrate is possible. Recommend follow-up.   Underlying fibrotic, interstitial changes again seen of the lungs. Please correlate with the history. Elevation of the right hemidiaphragm   **Interim History Since Eliquis has been held given her drop in hemoglobin.  She was typed and screened and transfused 1 unit PRBCs.  Continues to be significant orthostatic.  GI is planning for an EGD and colonoscopy with possible video capsule  endoscopy and they were done.  EGD was normal and colonoscopy was a very poor prep.  She underwent a VCE and the study was incomplete and there is unclear if the capsule reached which the cecum given that there is digested material in the entire small bowel.  The study was suboptimal but there was no visible blood in the small bowel.  GI recommended resuming anticoagulation at this time with outpatient GI follow-up and repeating VCE and following up with hematology for IV iron infusion referrals.  Will continue to repeat orthostatic vital signs but she appears to be medically stable.  Currently disposition is pending given that now that she does have bed offers but no insurance authorization and TOC to speak with the patient about going to SNF.  Assessment and Plan:  Syncope -Etiology is not clear but likely in the setting of Anemia.  -Patient reports shortness of breath and had some chest pain earlier, D-dimer +1.85, will need to rule out PE.   -Other differential diagnosis include orthostatic status and vasovagal syncope.   -No focal neurodeficit on physical examination.   -CT head negative.  Low suspicions for stroke. -Orthostatic vital signs Positive previously but repeat showed that she did not drop as BP lying went from 120/64 -> Sitting at 127/57 -> Standing at 110/80 -CTA negative for PE, and LE doppler negative for DVT -Neuro checks  -IVF now stopped -PT/OT eval and treat and recommending SNF but currently does not have any SNF Bed offers at this time.  -  See below   Orthostatic Hypotension, improved -In the setting of Anemia and ? Bleeding -Gave 500 mL bolus she is s/p Maintenance IVF with NS @ 40 mL/hr x 1 day -TED hose -PT/OT to evaluate and treat and recommending SNF but currently does not have any SNF offers at this time -Repeat Orthostatic VS today are as above   HTN (Hypertension) -Held Lisinopril due to softer blood pressure -IV hydralazine as needed -Continue to Monitor  BP per Protocol -Last BP reading was 120/64   COPD (chronic obstructive pulmonary disease)  -Patient reports dry cough, shortness of breath, no wheezing on auscultation.   -Does not seem to have COPD exacerbation.   -Chest x-ray showed some basilar opacity, but the patient does not have fever or leukocytosis. Procalcitonin < 0.10.  -Clinically does not seem to have pneumonia.   -Patient received 1 dose of Rocephin and azithromycin in ED, will Discontinue antibiotics. -Sputum culture was checked for some reason and shows:  Gram Stain FEW SQUAMOUS EPITHELIAL CELLS PRESENT NO WBC SEEN RARE GRAM POSITIVE COCCI Performed at [facility], [address], [place], [place] [address]  Culture MODERATE PROTEUS PENNERI MODERATE STENOTROPHOMONAS MALTOPHILIA FEW CITROBACTER FREUNDII  Report Status 08/25/2023 FINAL  Organism ID, Bacteria STENOTROPHOMONAS MALTOPHILIA  Organism ID, Bacteria CITROBACTER FREUNDII  Organism ID, Bacteria PROTEUS PENNERI  Resulting Agency [facility]     Susceptibility   Stenotrophomonas maltophilia Citrobacter freundii Proteus penneri    MIC MIC MIC    AMPICILLIN     >=32 RESIST... Resistant    AMPICILLIN/SULBACTAM     16 INTERMED... Intermediate    CEFEPIME   <=0.12 SENS... Sensitive <=0.12 SENS... Sensitive    CEFTAZIDIME   <=1 SENSITIVE Sensitive 2 SENSITIVE Sensitive    CEFTRIAXONE   <=0.25 SENS... Sensitive 32 RESISTANT Resistant    CIPROFLOXACIN   <=0.25 SENS... Sensitive <=0.25 SENS... Sensitive    GENTAMICIN   <=1 SENSITIVE Sensitive <=1 SENSITIVE Sensitive    IMIPENEM   <=0.25 SENS... Sensitive 2 SENSITIVE Sensitive    LEVOFLOXACIN 1 SENSITIVE Sensitive        PIP/TAZO   <=4 SENSITI... Sensitive <=4 SENSITI... Sensitive    TRIMETH/SULFA <=20 SENSIT... Sensitive <=20 SENSIT... Sensitive <=20 SENSIT... Sensitive                  -Spoke With ID Dr. Avon Gully who agrees with me that this is colonization and does not need treatment SpO2: 94 % -C/w  Bronchodilators -C/w Dextromethorphan-Guaifenesin 30-600 mg po BID prn Cough -Repeat chest x-ray this AM done and showed "Low lung volumes with subpleural fibrotic changes. No evidence of acute chest disease."  Vitamin D Deficiency -Vitamin D, 25-Hydroxy was 17.29 -Start Supplementation with Vitamin D 50,000 units weekly   PAF (Paroxysmal Atrial Fibrillation) (HCC) -Was Holding Eliquis due to low Hb but now will resume as it is ok with GI -HR's are stable so  will discontinue Telemetry Monitoring    Type II Diabetes Mellitus with Renal Manifestations (HCC):  -Recent A1c 5.9, well-controlled.  Patient seem to be not taking medications. -Check blood sugar every morning -CBG Trend: Recent Labs  Lab 08/21/23 0853 08/22/23 0759 08/24/23 0909 08/25/23 0916 08/25/23 1231  GLUCAP 82 78 91 115* 137*   Symptomatic Normocytic Anemia/ ? 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 -Hgb/Hct Trend: Recent Labs  Lab 08/20/23 1321 08/21/23 0528 08/21/23 1608 08/22/23 0358 08/23/23 0455 08/24/23 0413 08/25/23 0550  HGB 8.1* 6.3* 8.3* 7.9* 8.3* 8.3* 7.8*  HCT 26.3* 22.1* 26.3* 23.1* 24.0* 26.6* 23.6*  MCV 85.9 83.7  --  84.9 89.9 85.3 83.4  -Iron Panel was checked and iron level 16, UIBC of 349, TIBC of 365, saturation ratios of 4%, folate level 7.1 and vitamin B12 level 205 -Continued IV PPI with Pantoprazole 40 mg BID and will change to po -Check FOBT and was Negative  -GI Recommending Parenteral Iron while Inpatient and oral Iron supplementation as an outpatient and will need to follow up with Oncology  -Held Anticoagulation but now GI recommending to restart today so have resumed AC with Apixaban 5 mg po BID -Continue to Monitor for S/Sx of Bleeding -GI Planning on EGD and Colonoscopy with possible video capsule endoscopy. -EGD was normal with esophagus and stomach and entire duodenum being normal.  Colonoscopy was done and the perianal and digital rectal examinations  were normal and pertinent negatives included normal sphincter tone and no palpable rectal lesions there is copious quantities of semiliquid semisolid stool found in the entire colon which precluded her visualization and a retroflexed view of the distal rectum and anal verge were normal -Patient ended up getting a video capsule endoscopy but the study was incomplete and is unclear if the capsule reached the cecum given that there is digested material in the entire small bowel.  This study was suboptimal but there is no visual blood in the small bowel and GI recommending repeating the study in the outpatient setting -Given this GI has not recommended resuming anticoagulation from a GI standpoint and following up in outpatient with referral to hematology for iron infusions  CKD Stage 3a -Relatively stable. BUN/Cr Trend: Recent Labs  Lab 08/11/23 1625 08/20/23 1321 08/21/23 0528 08/22/23 0358 08/23/23 0455 08/24/23 0413 08/25/23 0550  BUN 13 11 11 12 10 9 11   CREATININE 1.04* 1.03* 1.00 1.24* 1.01* 1.21* 1.07*  -Gave her a 500 mL bolus yesterday again ; Maintenance IVF with NS at 40 mL/hr now stopped -Avoid Nephrotoxic Medications, Contrast Dyes, Hypotension and Dehydration to Ensure Adequate Renal Perfusion and will need to Renally Adjust Meds -Continue to Monitor and Trend Renal Function carefully and repeat CMP in the AM   Hypokalemia -Patient's K+ Level Trend: Recent Labs  Lab 08/11/23 1625 08/20/23 1321 08/21/23 0528 08/22/23 0358 08/23/23 0455 08/24/23 0413 08/25/23 0550  K 3.6 3.1* 4.0 4.0 3.6 4.1 3.6  -Continue to Monitor and Replete as Necessary -Repeat CMP in the AM   Hypoalbuminemia -Patient's Albumin Trend: Recent Labs  Lab 08/11/23 1625 08/21/23 0839 08/23/23 0455 08/25/23 0550  ALBUMIN 3.6 2.7* 2.9* 2.9*  -Continue to Monitor and Trend and repeat CMP in the AM  Obesity -Complicates overall prognosis and care -Estimated body mass index is 32.28 kg/m as  calculated from the following:   Height as of this encounter: 5\' 6"  (1.676 m).   Weight as of this encounter: 90.7 kg.  -Weight Loss and Dietary Counseling given   DVT prophylaxis: Place TED hose Start: 08/22/23 1555 Place and maintain sequential compression device Start: 08/21/23 1155 apixaban (ELIQUIS) tablet 5 mg    Code Status: Full Code Family Communication: No family currently at bedside  Disposition Plan:  Level of care: Telemetry Medical Status is: Observation The patient will require care spanning > 2 midnights and should be moved to inpatient because: Needs SNF placement and awaiting bed acceptance and insulin to authorization.  Appears to be medically stable now given that she is no longer orthostatic   Consultants:  Gastroenterology  Procedures:  EGD Findings:      The esophagus was normal.      The stomach was normal.      The examined duodenum was normal. Impression:            - Normal esophagus.                        - Normal stomach.                        - Normal examined duodenum.                        - No specimens collected. Recommendation:        - Proceed with colonoscopy as scheduled                        See colonoscopy report   COLONOSCOPY Findings:      The perianal and digital rectal examinations were normal. Pertinent       negatives include normal sphincter tone and no palpable rectal lesions.      Copious quantities of semi-liquid semi-solid stool was found in the       entire colon, precluding visualization.      The retroflexed view of the distal rectum and anal verge was normal and       showed no anal or rectal abnormalities. Impression:            - Preparation of the colon was poor.                        - Stool in the entire examined colon.                        - The distal rectum and anal verge are normal on                         retroflexion view.                        - No specimens collected. Recommendation:        -  Return patient to hospital ward for ongoing care.                        - Resume previous diet today.                        - To visualize the small bowel, perform video capsule                         endoscopy today.   VCE  VCE report   Study is incomplete Unclear if capsule reached cecum Digested material in entire small bowel   Suboptimal study but no visible blood in the small bowel Repeat study as outpt Ok to resume eliquis from GI standpoint Follow up with GI as outpt Recommend hematology referral for IV iron as outpt GI will sign off at this time, please call us with questions/concerns    Antimicrobials:  Anti-infectives (From admission, onward)    Start     Dose/Rate Route Frequency Ordered Stop   08/20/23 1715  cefTRIAXone (ROCEPHIN) 2 g in sodium chloride 0.9 %  100 mL IVPB        2 g 200 mL/hr over 30 Minutes Intravenous  Once 08/20/23 1710 08/20/23 2144   08/20/23 1715  azithromycin (ZITHROMAX) 500 mg in sodium chloride 0.9 % 250 mL IVPB        500 mg 250 mL/hr over 60 Minutes Intravenous  Once 08/20/23 1710 08/21/23 0419       Subjective: Seen and examined at bedside and feeling quite well.  Denies chest pain or shortness breath.  No nausea or vomiting.  Dizziness is improved.  No other concerns or complaints at this time.   Objective: Vitals:   08/25/23 0104 08/25/23 0508 08/25/23 0919 08/25/23 1546  BP: (!) 145/77 (!) 112/92 120/64 (!) 128/58  Pulse: 82 79 82 86  Resp: 18 18 20 20   Temp: 97.7 F (36.5 C) 98.1 F (36.7 C) 97.8 F (36.6 C) 97.8 F (36.6 C)  TempSrc:  Oral    SpO2: 98% 96% 94% 98%  Weight:      Height:        Intake/Output Summary (Last 24 hours) at 08/25/2023 1556 Last data filed at 08/25/2023 0900 Gross per 24 hour  Intake 240 ml  Output --  Net 240 ml   Filed Weights   08/20/23 1319  Weight: 90.7 kg   Examination: Physical Exam:  Constitutional: WN/WD obese Caucasian female no acute distress appears calm Respiratory:  Diminished to auscultation bilaterally, no wheezing, rales, rhonchi or crackles. Normal respiratory effort and patient is not tachypenic. No accessory muscle use.  Unlabored breathing Cardiovascular: RRR, no murmurs / rubs / gallops. S1 and S2 auscultated.  Trace lower extremity edema Abdomen: Soft, non-tender, distended secondary body habitus. Bowel sounds positive.  GU: Deferred. Musculoskeletal: No clubbing / cyanosis of digits/nails. No joint deformity upper and lower extremities. Skin: No rashes, lesions, ulcers on a limited skin evaluation. No induration; Warm and dry.  Neurologic: CN 2-12 grossly intact with no focal deficits. Romberg sign and cerebellar reflexes not assessed.  Psychiatric: Normal judgment and insight. Alert and oriented x 3. Normal mood and appropriate affect.   Data Reviewed: I have personally reviewed following labs and imaging studies  CBC: Recent Labs  Lab 08/21/23 0528 08/21/23 1608 08/22/23 0358 08/23/23 0455 08/24/23 0413 08/25/23 0550  WBC 7.5  --  7.4 6.8 9.0 8.3  NEUTROABS  --   --   --  4.4  --  5.5  HGB 6.3* 8.3* 7.9* 8.3* 8.3* 7.8*  HCT 22.1* 26.3* 23.1* 24.0* 26.6* 23.6*  MCV 83.7  --  84.9 89.9 85.3 83.4  PLT 205  --  201 205 204 199   Basic Metabolic Panel: Recent Labs  Lab 08/20/23 1321 08/21/23 0528 08/22/23 0358 08/23/23 0455 08/24/23 0413 08/25/23 0550  NA 140 140 141 140 140 141  K 3.1* 4.0 4.0 3.6 4.1 3.6  CL 107 109 109 110 108 109  CO2 24 25 25 23 25 28   GLUCOSE 133* 89 86 79 87 96  BUN 11 11 12 10 9 11   CREATININE 1.03* 1.00 1.24* 1.01* 1.21* 1.07*  CALCIUM 8.6* 8.0* 8.4* 8.1* 8.6* 8.6*  MG 2.3  --  2.3 2.3  --  2.1  PHOS 2.7  --  3.3 3.2  --  3.6   GFR: Estimated Creatinine Clearance: 47.6 mL/min (A) (by C-G formula based on SCr of 1.07 mg/dL (H)). Liver Function Tests: Recent Labs  Lab 08/21/23 0839 08/23/23 0455 08/25/23 0550  AST 16 16 13*  ALT  8 9 8   ALKPHOS 61 66 66  BILITOT 0.4 0.4 0.6  PROT 5.9* 5.8*  6.0*  ALBUMIN 2.7* 2.9* 2.9*   No results for input(s): "LIPASE", "AMYLASE" in the last 168 hours. No results for input(s): "AMMONIA" in the last 168 hours. Coagulation Profile: No results for input(s): "INR", "PROTIME" in the last 168 hours. Cardiac Enzymes: Recent Labs  Lab 08/21/23 0839  CKTOTAL 20*   BNP (last 3 results) No results for input(s): "PROBNP" in the last 8760 hours. HbA1C: No results for input(s): "HGBA1C" in the last 72 hours. CBG: Recent Labs  Lab 08/21/23 0853 08/22/23 0759 08/24/23 0909 08/25/23 0916 08/25/23 1231  GLUCAP 82 78 91 115* 137*   Lipid Profile: No results for input(s): "CHOL", "HDL", "LDLCALC", "TRIG", "CHOLHDL", "LDLDIRECT" in the last 72 hours. Thyroid Function Tests: No results for input(s): "TSH", "T4TOTAL", "FREET4", "T3FREE", "THYROIDAB" in the last 72 hours. Anemia Panel: No results for input(s): "VITAMINB12", "FOLATE", "FERRITIN", "TIBC", "IRON", "RETICCTPCT" in the last 72 hours. Sepsis Labs: Recent Labs  Lab 08/20/23 1321 08/20/23 1909 08/20/23 2348  PROCALCITON <0.10  --   --   LATICACIDVEN  --  1.1 0.8   Recent Results (from the past 240 hour(s))  Blood culture (routine x 2)     Status: None   Collection Time: 08/20/23  5:09 PM   Specimen: BLOOD  Result Value Ref Range Status   Specimen Description BLOOD BLOOD RIGHT FOREARM  Final   Special Requests   Final    BOTTLES DRAWN AEROBIC AND ANAEROBIC Blood Culture results may not be optimal due to an inadequate volume of blood received in culture bottles   Culture   Final    NO GROWTH 5 DAYS Performed at [facility], [address], [place], [place] [address]    Report Status 08/25/2023 FINAL  Final  SARS Coronavirus 2 by RT PCR (hospital order, performed in [facility] hospital lab) *cepheid single result test* Anterior Nasal Swab     Status: None   Collection Time: 08/20/23  5:09 PM   Specimen: Anterior Nasal Swab  Result Value Ref Range Status   SARS  Coronavirus 2 by RT PCR NEGATIVE NEGATIVE Final    Comment: (NOTE) SARS-CoV-2 target nucleic acids are NOT DETECTED.  The SARS-CoV-2 RNA is generally detectable in upper and lower respiratory specimens during the acute phase of infection. The lowest concentration of SARS-CoV-2 viral copies this assay can detect is 250 copies / mL. A negative result does not preclude SARS-CoV-2 infection and should not be used as the sole basis for treatment or other patient management decisions.  A negative result may occur with improper specimen collection / handling, submission of specimen other than nasopharyngeal swab, presence of viral mutation(s) within the areas targeted by this assay, and inadequate number of viral copies (<250 copies / mL). A negative result must be combined with clinical observations, patient history, and epidemiological information.  Fact Sheet for Patients:   RoadLapTop.co.za  Fact Sheet for Healthcare Providers: http://kim-miller.com/  This test is not yet approved or  cleared by the [place] FDA and has been authorized for detection and/or diagnosis of SARS-CoV-2 by FDA under an Emergency Use Authorization (EUA).  This EUA will remain in effect (meaning this test can be used) for the duration of the COVID-19 declaration under Section 564(b)(1) of the Act, 21 U.S.C. section 360bbb-3(b)(1), unless the authorization is terminated or revoked sooner.  Performed at [facility], [address] [place], [place]  [address]   Blood culture (routine x 2)     Status: None   Collection Time: 08/20/23  7:09 PM   Specimen: BLOOD  Result Value Ref Range Status   Specimen Description BLOOD LEFT ANTECUBITAL  Final   Special Requests   Final    BOTTLES DRAWN AEROBIC AND ANAEROBIC Blood Culture adequate volume   Culture   Final    NO GROWTH 5 DAYS Performed at [facility], [address] [place], [place]  [address]    Report Status 08/25/2023 FINAL  Final  Expectorated Sputum Assessment w Gram Stain, Rflx to Resp Cult     Status: None   Collection Time: 08/20/23 11:48 PM   Specimen: Sputum  Result Value Ref Range Status   Specimen Description SPUTUM  Final   Special Requests NONE  Final   Sputum evaluation   Final    THIS SPECIMEN IS ACCEPTABLE FOR SPUTUM CULTURE Performed at [facility], [address], [place], [place] [address]    Report Status 08/21/2023 FINAL  Final  Culture, Respiratory w Gram Stain     Status: None   Collection Time: 08/20/23 11:48 PM   Specimen: SPU  Result Value Ref Range Status   Specimen Description   Final    SPUTUM Performed at [facility], [address], [place], [place] [address]    Special Requests   Final    NONE Reflexed from [id number] Performed at [facility], [address] [place], [place] [address]    Gram Stain   Final    FEW SQUAMOUS EPITHELIAL CELLS PRESENT NO WBC SEEN RARE GRAM POSITIVE COCCI Performed at [facility], [address], [place], [place] [address]    Culture   Final    MODERATE PROTEUS PENNERI MODERATE STENOTROPHOMONAS MALTOPHILIA FEW CITROBACTER FREUNDII    Report Status 08/25/2023 FINAL  Final   Organism ID, Bacteria STENOTROPHOMONAS MALTOPHILIA  Final   Organism ID, Bacteria CITROBACTER FREUNDII  Final   Organism ID, Bacteria PROTEUS PENNERI  Final      Susceptibility   Citrobacter freundii - MIC*    CEFEPIME <=0.12 SENSITIVE Sensitive     CEFTAZIDIME <=1 SENSITIVE Sensitive     CEFTRIAXONE <=0.25 SENSITIVE Sensitive     CIPROFLOXACIN <=0.25 SENSITIVE Sensitive     GENTAMICIN <=1 SENSITIVE Sensitive     IMIPENEM <=0.25 SENSITIVE Sensitive     TRIMETH/SULFA <=20 SENSITIVE Sensitive     PIP/TAZO <=4 SENSITIVE Sensitive ug/mL    * FEW CITROBACTER FREUNDII   Proteus penneri - MIC*    AMPICILLIN >=32 RESISTANT Resistant     CEFEPIME <=0.12 SENSITIVE Sensitive     CEFTAZIDIME  2 SENSITIVE Sensitive     CEFTRIAXONE 32 RESISTANT Resistant     CIPROFLOXACIN <=0.25 SENSITIVE Sensitive     GENTAMICIN <=1 SENSITIVE Sensitive     IMIPENEM 2 SENSITIVE Sensitive     TRIMETH/SULFA <=20 SENSITIVE Sensitive     AMPICILLIN/SULBACTAM 16 INTERMEDIATE Intermediate     PIP/TAZO <=4 SENSITIVE Sensitive ug/mL    * MODERATE PROTEUS PENNERI   Stenotrophomonas maltophilia - MIC*    LEVOFLOXACIN 1 SENSITIVE Sensitive     TRIMETH/SULFA <=20 SENSITIVE Sensitive     * MODERATE STENOTROPHOMONAS MALTOPHILIA    Radiology Studies: DG Chest Port 1 View  Result Date: 08/25/2023 CLINICAL DATA:  [id number] with shortness of breath. EXAM: PORTABLE CHEST 1 VIEW COMPARISON:  CTA chest 08/20/2023 FINDINGS: 4:07 a.m. There are low lung volumes, with subpleural fibrotic  changes. No focal pneumonia is evident. There is no substantial pleural effusion. The cardiomediastinal silhouette is stable, with no evidence of CHF. There is aortic atherosclerosis. Multiple left axillary surgical clips. Thoracic spondylosis. IMPRESSION: Low lung volumes with subpleural fibrotic changes. No evidence of acute chest disease. Electronically Signed   By: Almira Bar M.D.   On: 08/25/2023 06:29    Scheduled Meds:  apixaban  5 mg Oral BID   atorvastatin  20 mg Oral Daily   bisacodyl  10 mg Oral QHS   cyanocobalamin  1,000 mcg Oral Daily   folic acid  1 mg Oral Daily   gabapentin  600 mg Oral QHS   mometasone-formoterol  2 puff Inhalation BID   pantoprazole  40 mg Oral BID   polyethylene glycol  17 g Oral BID   Vitamin D (Ergocalciferol)  50,000 Units Oral Q7 days   Continuous Infusions:   LOS: 0 days   Marguerita Merles, DO [facility] Available via Epic secure chat 7am-7pm After these hours, please refer to coverage provider listed on [email or web address] 08/25/2023, 3:56 PM

## 2023-08-25 NOTE — Progress Notes (Signed)
Per Dr Marland Mcalpine, dc tele monitoring

## 2023-08-25 NOTE — Plan of Care (Signed)

## 2023-08-26 DIAGNOSIS — E876 Hypokalemia: Secondary | ICD-10-CM | POA: Diagnosis not present

## 2023-08-26 DIAGNOSIS — D649 Anemia, unspecified: Secondary | ICD-10-CM | POA: Diagnosis not present

## 2023-08-26 DIAGNOSIS — J449 Chronic obstructive pulmonary disease, unspecified: Secondary | ICD-10-CM | POA: Diagnosis not present

## 2023-08-26 DIAGNOSIS — R55 Syncope and collapse: Secondary | ICD-10-CM | POA: Diagnosis not present

## 2023-08-26 LAB — COMPREHENSIVE METABOLIC PANEL
ALT: 8 U/L (ref 0–44)
AST: 13 U/L — ABNORMAL LOW (ref 15–41)
Albumin: 2.9 g/dL — ABNORMAL LOW (ref 3.5–5.0)
Alkaline Phosphatase: 64 U/L (ref 38–126)
Anion gap: 6 (ref 5–15)
BUN: 13 mg/dL (ref 8–23)
CO2: 27 mmol/L (ref 22–32)
Calcium: 8.2 mg/dL — ABNORMAL LOW (ref 8.9–10.3)
Chloride: 107 mmol/L (ref 98–111)
Creatinine, Ser: 1 mg/dL (ref 0.44–1.00)
GFR, Estimated: 57 mL/min — ABNORMAL LOW (ref >60)
Glucose, Bld: 96 mg/dL (ref 70–99)
Potassium: 3.6 mmol/L (ref 3.5–5.1)
Sodium: 140 mmol/L (ref 135–145)
Total Bilirubin: 0.4 mg/dL (ref <1.2)
Total Protein: 6 g/dL — ABNORMAL LOW (ref 6.5–8.1)

## 2023-08-26 LAB — CBC WITH DIFFERENTIAL/PLATELET
Abs Immature Granulocytes: 0.05 10*3/uL (ref 0.00–0.07)
Basophils Absolute: 0.1 10*3/uL (ref 0.0–0.1)
Basophils Relative: 1 %
Eosinophils Absolute: 0.5 10*3/uL (ref 0.0–0.5)
Eosinophils Relative: 6 %
HCT: 23 % — ABNORMAL LOW (ref 36.0–46.0)
Hemoglobin: 8 g/dL — ABNORMAL LOW (ref 12.0–15.0)
Immature Granulocytes: 1 %
Lymphocytes Relative: 24 %
Lymphs Abs: 1.8 10*3/uL (ref 0.7–4.0)
MCH: 29.9 pg (ref 26.0–34.0)
MCHC: 34.8 g/dL (ref 30.0–36.0)
MCV: 85.8 fL (ref 80.0–100.0)
Monocytes Absolute: 0.6 10*3/uL (ref 0.1–1.0)
Monocytes Relative: 8 %
Neutro Abs: 4.5 10*3/uL (ref 1.7–7.7)
Neutrophils Relative %: 60 %
Platelets: 208 10*3/uL (ref 150–400)
RBC: 2.68 MIL/uL — ABNORMAL LOW (ref 3.87–5.11)
RDW: 17.1 % — ABNORMAL HIGH (ref 11.5–15.5)
WBC: 7.4 10*3/uL (ref 4.0–10.5)
nRBC: 0 % (ref 0.0–0.2)

## 2023-08-26 LAB — CELIAC DISEASE PANEL
Endomysial Ab, IgA: NEGATIVE
IgA: 363 mg/dL (ref 64–422)
Tissue Transglutaminase Ab, IgA: 3 U/mL (ref 0–3)

## 2023-08-26 LAB — GLUCOSE, CAPILLARY
Glucose-Capillary: 82 mg/dL (ref 70–99)
Glucose-Capillary: 107 mg/dL — ABNORMAL HIGH (ref 70–99)
Glucose-Capillary: 107 mg/dL — ABNORMAL HIGH (ref 70–99)
Glucose-Capillary: 108 mg/dL — ABNORMAL HIGH (ref 70–99)

## 2023-08-26 LAB — MAGNESIUM: Magnesium: 2.1 mg/dL (ref 1.7–2.4)

## 2023-08-26 LAB — PHOSPHORUS: Phosphorus: 3.7 mg/dL (ref 2.5–4.6)

## 2023-08-26 MED ORDER — POTASSIUM CHLORIDE CRYS ER 20 MEQ PO TBCR
40.0000 meq | EXTENDED_RELEASE_TABLET | Freq: Once | ORAL | Status: AC
  Administered 2023-08-26: 40 meq via ORAL
  Filled 2023-08-26: qty 2

## 2023-08-26 NOTE — TOC Progression Note (Signed)
Transition of Care [facility]) - Progression Note    Patient Details  Name: Tracy Dickerson MRN: [id number] Date of Birth: 03/18/1941  Transition of Care [facility] CM/SW Contact  Garret Reddish, RN Phone Number: 08/26/2023, 5:19 PM  Clinical Narrative:     I have attempted to call patient's brother [name].  His phone is currently not working.  I went to the room and patient's brother was not at bedside.    I have spoken with DSS and they are currently seeking guardianship of the patient.    TOC will continue to follow for discharge planning.    Expected Discharge Plan: Skilled Nursing Facility Barriers to Discharge: Continued Medical Work up  Expected Discharge Plan and Services       Living arrangements for the past 2 months: Single Family Home                                       Social Determinants of Health (SDOH) Interventions SDOH Screenings   Food Insecurity: No Food Insecurity (08/21/2023)  Housing: Medium Risk (08/21/2023)  Transportation Needs: No Transportation Needs (08/21/2023)  Utilities: Not At Risk (08/21/2023)  Financial Resource Strain: Low Risk  (02/04/2023)   Received from [facility], [facility]  Physical Activity: Inactive (07/16/2021)   Received from [facility], [facility]  Stress: No Stress Concern Present (07/16/2021)   Received from [facility], [facility]  Tobacco Use: Medium Risk (08/23/2023)  Health Literacy: Medium Risk (07/16/2021)   Received from [facility], [facility]    Readmission Risk Interventions     No data to display

## 2023-08-26 NOTE — Progress Notes (Signed)
PROGRESS NOTE    Tracy Dickerson  KZS:[id number] DOB: 05-23-1941 DOA: 08/20/2023 PCP: Tracy Melena, PA   Brief Narrative:  HPI per Dr. Lorretta Dickerson on 08/20/23  Tracy Dickerson is a 82 y.o. female with medical history significant of hypertension, hyperlipidemia, diabetes mellitus, GERD, breast cancer, CKD-3a, septic shock due to UTI, PAF on Eliquis, who presents with generalized weakness, syncope, SOB and cough.   Pt states that over the last 2 weeks, she has had progressive worsening generalized weakness. She has dizziness and lightheadedness. She passed out twice today.  No unilateral numbness or tinglings in extremities.  No facial droop or slurred speech.  She has dry cough and shortness of breath.  She had some chest pain in the morning, which has resolved.  No fever or chills.  No nausea, vomiting, diarrhea or abdominal pain.  No symptoms of UTI.  Patient states that she ran out of her medications for more than 2 weeks.  She did not take her medications in the past 2 weeks, including Eliquis.  Per ED RN, pt was found to bed bugs.    Data reviewed independently and ED Course: pt was found to have WBC 7.7, trop 12, D-dimer 1.85, BNP 233, stable renal function, potassium 3.1, negative COVID PCR, temperature normal, blood pressure 110/59, heart rate 68, RR 20, oxygen saturation 95% on room air.  CT of the head and neck is negative for acute injury.  Patient is placed on telemetry bed for observation.   Chest x-ray: Slight increase in lung base opacities. Subtle infiltrate is possible. Recommend follow-up.   Underlying fibrotic, interstitial changes again seen of the lungs. Please correlate with the history. Elevation of the right hemidiaphragm   **Interim History Since Eliquis has been held given her drop in hemoglobin.  She was typed and screened and transfused 1 unit PRBCs.  Continues to be significant orthostatic.  GI is planning for an EGD and colonoscopy with possible video capsule  endoscopy and they were done.  EGD was normal and colonoscopy was a very poor prep.  She underwent a VCE and the study was incomplete and there is unclear if the capsule reached which the cecum given that there is digested material in the entire small bowel.  The study was suboptimal but there was no visible blood in the small bowel.  GI recommended resuming anticoagulation at this time with outpatient GI follow-up and repeating VCE and following up with hematology for IV iron infusion referrals.  Repeat orthostatic vital signs but she appears to be medically stable.  Currently disposition is pending pending TOC and SNF Authorization.   Assessment and Plan:  Syncope -Etiology is not clear but likely in the setting of Anemia.  -Patient reports shortness of breath and had some chest pain earlier, D-dimer +1.85, will need to rule out PE.   -Other differential diagnosis include orthostatic status and vasovagal syncope.   -No focal neurodeficit on physical examination.   -CT head negative.  Low suspicions for stroke. -Orthostatic vital signs Positive previously but repeat showed that she did not drop as BP lying went from 120/64 -> Sitting at 127/57 -> Standing at 110/80 -CTA negative for PE, and LE doppler negative for DVT -Neuro checks  -IVF now stopped -PT/OT eval and treat and recommending SNF; MEDICALLY STABLE TO D/C As of 08/26/23 but she will need SNF authorization. TOC also need to speak with her brother as DSS is involved and  TOC spoke with DSS yesterday. They are  trying obtain guardianship from Tracy Dickerson. TOC will speak with the brother and see if he would be willing to sign her into the facility.    Orthostatic Hypotension, improved -In the setting of Anemia and ? Bleeding -IVF now stopped -TED hose -PT/OT to evaluate and treat and recommending SNF but currently does not have any SNF offers at this time -Repeat Orthostatic VS today are as above and was not orthostatic or dizzy   HTN  (Hypertension) -Held Lisinopril due to softer blood pressure -IV hydralazine as needed -Continue to Monitor BP per Protocol -Last BP reading was on the higher side at 150/81   COPD (chronic obstructive pulmonary disease)  -Patient reports dry cough, shortness of breath, no wheezing on auscultation.   -Does not seem to have COPD exacerbation.   -Chest x-ray showed some basilar opacity, but the patient does not have fever or leukocytosis. Procalcitonin < 0.10.  -Clinically does not seem to have pneumonia.   -Patient received 1 dose of Rocephin and azithromycin in ED, will Discontinue antibiotics. -Sputum culture was checked for some reason and shows:  Gram Stain FEW SQUAMOUS EPITHELIAL CELLS PRESENT NO WBC SEEN RARE GRAM POSITIVE COCCI Performed at [facility], [address] [place], [place] [address]  Culture MODERATE PROTEUS PENNERI MODERATE STENOTROPHOMONAS MALTOPHILIA FEW CITROBACTER FREUNDII  Report Status 08/25/2023 FINAL  Organism ID, Bacteria STENOTROPHOMONAS MALTOPHILIA  Organism ID, Bacteria CITROBACTER FREUNDII  Organism ID, Bacteria PROTEUS PENNERI  Resulting Agency [facility]     Susceptibility   Stenotrophomonas maltophilia Citrobacter freundii Proteus penneri    MIC MIC MIC    AMPICILLIN     >=32 RESIST... Resistant    AMPICILLIN/SULBACTAM     16 INTERMED... Intermediate    CEFEPIME   <=0.12 SENS... Sensitive <=0.12 SENS... Sensitive    CEFTAZIDIME   <=1 SENSITIVE Sensitive 2 SENSITIVE Sensitive    CEFTRIAXONE   <=0.25 SENS... Sensitive 32 RESISTANT Resistant    CIPROFLOXACIN   <=0.25 SENS... Sensitive <=0.25 SENS... Sensitive    GENTAMICIN   <=1 SENSITIVE Sensitive <=1 SENSITIVE Sensitive    IMIPENEM   <=0.25 SENS... Sensitive 2 SENSITIVE Sensitive    LEVOFLOXACIN 1 SENSITIVE Sensitive        PIP/TAZO   <=4 SENSITI... Sensitive <=4 SENSITI... Sensitive    TRIMETH/SULFA <=20 SENSIT... Sensitive <=20 SENSIT... Sensitive <=20 SENSIT... Sensitive                   -Spoke With ID Tracy Dickerson who agrees with me that this is colonization and does not need treatment SpO2: 98 % -C/w Bronchodilators -C/w Dextromethorphan-Guaifenesin 30-600 mg po BID prn Cough -Repeat chest x-ray this AM done and showed "Low lung volumes with subpleural fibrotic changes. No evidence of acute chest disease."  Vitamin D Deficiency -Vitamin D, 25-Hydroxy was 17.29 -Start Supplementation with Vitamin D 50,000 units weekly   PAF (Paroxysmal Atrial Fibrillation) (HCC) -Was Holding Eliquis due to low Hb but now will resume as it is ok with GI -HR's are stable so  will discontinue Telemetry Monitoring    Type II Diabetes Mellitus with Renal Manifestations (HCC):  -Recent A1c 5.9, well-controlled.  Patient seem to be not taking medications. -Check blood sugar every morning -CBG Trend: Recent Labs  Lab 08/21/23 0853 08/22/23 0759 08/24/23 0909 08/25/23 0916 08/25/23 1231 08/25/23 1702 08/26/23 0753  GLUCAP 82 78 91 115* 137* 152* 82   Symptomatic Normocytic Anemia/ ? 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 -Hgb/Hct Trend: Recent  Labs  Lab 08/21/23 0528 08/21/23 1608 08/22/23 0358 08/23/23 0455 08/24/23 0413 08/25/23 0550 08/26/23 0418  HGB 6.3* 8.3* 7.9* 8.3* 8.3* 7.8* 8.0*  HCT 22.1* 26.3* 23.1* 24.0* 26.6* 23.6* 23.0*  MCV 83.7  --  84.9 89.9 85.3 83.4 85.8  -Iron Panel was checked and iron level 16, UIBC of 349, TIBC of 365, saturation ratios of 4%, folate level 7.1 and vitamin B12 level 205 -Continued IV PPI with Pantoprazole 40 mg BID and will change to po -Check FOBT and was Negative  -GI Recommending Parenteral Iron while Inpatient and oral Iron supplementation as an outpatient and will need to follow up with Oncology  -Held Anticoagulation but now GI recommending to restart so have resumed AC with Apixaban 5 mg po BID -Continue to Monitor for S/Sx of Bleeding -GI Planning on EGD and Colonoscopy with possible video  capsule endoscopy. -EGD was normal with esophagus and stomach and entire duodenum being normal.  Colonoscopy was done and the perianal and digital rectal examinations were normal and pertinent negatives included normal sphincter tone and no palpable rectal lesions there is copious quantities of semiliquid semisolid stool found in the entire colon which precluded her visualization and a retroflexed view of the distal rectum and anal verge were normal -Patient ended up getting a video capsule endoscopy but the study was incomplete and is unclear if the capsule reached the cecum given that there is digested material in the entire small bowel.  This study was suboptimal but there is no visual blood in the small bowel and GI recommending repeating the study in the outpatient setting -Given this GI has not recommended resuming anticoagulation from a GI standpoint and following up in outpatient with referral to hematology for iron infusions  CKD Stage 3a -Relatively stable. BUN/Cr Trend: Recent Labs  Lab 08/20/23 1321 08/21/23 0528 08/22/23 0358 08/23/23 0455 08/24/23 0413 08/25/23 0550 08/26/23 0418  BUN 11 11 12 10 9 11 13   CREATININE 1.03* 1.00 1.24* 1.01* 1.21* 1.07* 1.00  -IVF now stopped -Avoid Nephrotoxic Medications, Contrast Dyes, Hypotension and Dehydration to Ensure Adequate Renal Perfusion and will need to Renally Adjust Meds -Continue to Monitor and Trend Renal Function carefully and repeat CMP in the AM   Hypokalemia -Patient's K+ Level Trend: Recent Labs  Lab 08/20/23 1321 08/21/23 0528 08/22/23 0358 08/23/23 0455 08/24/23 0413 08/25/23 0550 08/26/23 0418  K 3.1* 4.0 4.0 3.6 4.1 3.6 3.6  -Continue to Monitor and Replete as Necessary -Repeat CMP in the AM   Hypoalbuminemia -Patient's Albumin Trend: Recent Labs  Lab 08/11/23 1625 08/21/23 0839 08/23/23 0455 08/25/23 0550 08/26/23 0418  ALBUMIN 3.6 2.7* 2.9* 2.9* 2.9*  -Continue to Monitor and Trend and repeat CMP  in the AM  Obesity -Complicates overall prognosis and care -Estimated body mass index is 32.28 kg/m as calculated from the following:   Height as of this encounter: 5\' 6"  (1.676 m).   Weight as of this encounter: 90.7 kg.  -Weight Loss and Dietary Counseling given  DVT prophylaxis: Place TED hose Start: 08/22/23 1555 Place and maintain sequential compression device Start: 08/21/23 1155 apixaban (ELIQUIS) tablet 5 mg    Code Status: Full Code Family Communication: No family present at bedside  Disposition Plan:  Level of care: Telemetry Medical Status is: Inpatient Remains inpatient appropriate because: She is medically stable for discharge to SNF however does not have insurance authorization and TOC still working given that DSS is involved and the need to go with the family to  the facility   Consultants:  Gastroenterology  Procedures:  EGD Findings:      The esophagus was normal.      The stomach was normal.      The examined duodenum was normal. Impression:            - Normal esophagus.                        - Normal stomach.                        - Normal examined duodenum.                        - No specimens collected. Recommendation:        - Proceed with colonoscopy as scheduled                        See colonoscopy report   COLONOSCOPY Findings:      The perianal and digital rectal examinations were normal. Pertinent       negatives include normal sphincter tone and no palpable rectal lesions.      Copious quantities of semi-liquid semi-solid stool was found in the       entire colon, precluding visualization.      The retroflexed view of the distal rectum and anal verge was normal and       showed no anal or rectal abnormalities. Impression:            - Preparation of the colon was poor.                        - Stool in the entire examined colon.                        - The distal rectum and anal verge are normal on                         retroflexion  view.                        - No specimens collected. Recommendation:        - Return patient to hospital ward for ongoing care.                        - Resume previous diet today.                        - To visualize the small bowel, perform video capsule                         endoscopy today.   VCE  VCE report   Study is incomplete Unclear if capsule reached cecum Digested material in entire small bowel   Suboptimal study but no visible blood in the small bowel Repeat study as outpt Ok to resume eliquis from GI standpoint Follow up with GI as outpt Recommend hematology referral for IV iron as outpt GI will sign off at this time, please call us with questions/concerns    Antimicrobials:  Anti-infectives (From admission, onward)    Start     Dose/Rate Route Frequency Ordered Stop   08/20/23 1715  cefTRIAXone (ROCEPHIN) 2 g in sodium chloride 0.9 % 100 mL IVPB        2 g 200 mL/hr over 30 Minutes Intravenous  Once 08/20/23 1710 08/20/23 2144   08/20/23 1715  azithromycin (ZITHROMAX) 500 mg in sodium chloride 0.9 % 250 mL IVPB        500 mg 250 mL/hr over 60 Minutes Intravenous  Once 08/20/23 1710 08/21/23 0419       Subjective: Seen and examined at bedside and she is doing fairly well.  No complaints and no dizziness.  Medically stable for discharge at this time but still awaiting disposition.  No nausea or vomiting.  Objective: Vitals:   08/25/23 1546 08/25/23 2009 08/26/23 0442 08/26/23 0751  BP: (!) 128/58 137/68 (!) 153/83 (!) 150/81  Pulse: 86 90 69 80  Resp: 20 17  15   Temp: 97.8 F (36.6 C) 97.8 F (36.6 C) 97.7 F (36.5 C) (!) 97.4 F (36.3 C)  TempSrc:      SpO2: 98% 98% 98% 98%  Weight:      Height:       No intake or output data in the 24 hours ending 08/26/23 1051 Filed Weights   08/20/23 1319  Weight: 90.7 kg   Examination: Physical Exam:  Constitutional: WN/WD obese female no acute distress appears calm Respiratory: Diminished to  auscultation bilaterally, no wheezing, rales, rhonchi or crackles. Normal respiratory effort and patient is not tachypenic. No accessory muscle use.  Unlabored breathing Cardiovascular: RRR, no murmurs / rubs / gallops. S1 and S2 auscultated. No extremity edema. .  Abdomen: Soft, non-tender, distended secondary to body habitus. Bowel sounds positive.  GU: Deferred. Musculoskeletal: No clubbing / cyanosis of digits/nails. No joint deformity upper and lower extremities.  Skin: No rashes, lesions, ulcers on a limited skin evaluation. No induration; Warm and dry.  Neurologic: CN 2-12 grossly intact with no focal deficits. Romberg sign and cerebellar reflexes not assessed.  Psychiatric: Normal judgment and insight. Alert and oriented x 3. Normal mood and appropriate affect.   Data Reviewed: I have personally reviewed following labs and imaging studies  CBC: Recent Labs  Lab 08/22/23 0358 08/23/23 0455 08/24/23 0413 08/25/23 0550 08/26/23 0418  WBC 7.4 6.8 9.0 8.3 7.4  NEUTROABS  --  4.4  --  5.5 4.5  HGB 7.9* 8.3* 8.3* 7.8* 8.0*  HCT 23.1* 24.0* 26.6* 23.6* 23.0*  MCV 84.9 89.9 85.3 83.4 85.8  PLT 201 205 204 199 208   Basic Metabolic Panel: Recent Labs  Lab 08/20/23 1321 08/21/23 0528 08/22/23 0358 08/23/23 0455 08/24/23 0413 08/25/23 0550 08/26/23 0418  NA 140   < > 141 140 140 141 140  K 3.1*   < > 4.0 3.6 4.1 3.6 3.6  CL 107   < > 109 110 108 109 107  CO2 24   < > 25 23 25 28 27   GLUCOSE 133*   < > 86 79 87 96 96  BUN 11   < > 12 10 9 11 13   CREATININE 1.03*   < > 1.24* 1.01* 1.21* 1.07* 1.00  CALCIUM 8.6*   < > 8.4* 8.1* 8.6* 8.6* 8.2*  MG 2.3  --  2.3 2.3  --  2.1 2.1  PHOS 2.7  --  3.3 3.2  --  3.6 3.7   < > = values in this interval not displayed.   GFR: Estimated Creatinine Clearance: 50.9 mL/min (by C-G formula based on SCr of 1 mg/dL). Liver Function Tests:  Recent Labs  Lab 08/21/23 0839 08/23/23 0455 08/25/23 0550 08/26/23 0418  AST 16 16 13* 13*  ALT  8 9 8 8   ALKPHOS 61 66 66 64  BILITOT 0.4 0.4 0.6 0.4  PROT 5.9* 5.8* 6.0* 6.0*  ALBUMIN 2.7* 2.9* 2.9* 2.9*   No results for input(s): "LIPASE", "AMYLASE" in the last 168 hours. No results for input(s): "AMMONIA" in the last 168 hours. Coagulation Profile: No results for input(s): "INR", "PROTIME" in the last 168 hours. Cardiac Enzymes: Recent Labs  Lab 08/21/23 0839  CKTOTAL 20*   BNP (last 3 results) No results for input(s): "PROBNP" in the last 8760 hours. HbA1C: No results for input(s): "HGBA1C" in the last 72 hours. CBG: Recent Labs  Lab 08/24/23 0909 08/25/23 0916 08/25/23 1231 08/25/23 1702 08/26/23 0753  GLUCAP 91 115* 137* 152* 82   Lipid Profile: No results for input(s): "CHOL", "HDL", "LDLCALC", "TRIG", "CHOLHDL", "LDLDIRECT" in the last 72 hours. Thyroid Function Tests: No results for input(s): "TSH", "T4TOTAL", "FREET4", "T3FREE", "THYROIDAB" in the last 72 hours. Anemia Panel: No results for input(s): "VITAMINB12", "FOLATE", "FERRITIN", "TIBC", "IRON", "RETICCTPCT" in the last 72 hours. Sepsis Labs: Recent Labs  Lab 08/20/23 1321 08/20/23 1909 08/20/23 2348  PROCALCITON <0.10  --   --   LATICACIDVEN  --  1.1 0.8   Recent Results (from the past 240 hour(s))  Blood culture (routine x 2)     Status: None   Collection Time: 08/20/23  5:09 PM   Specimen: BLOOD  Result Value Ref Range Status   Specimen Description BLOOD BLOOD RIGHT FOREARM  Final   Special Requests   Final    BOTTLES DRAWN AEROBIC AND ANAEROBIC Blood Culture results may not be optimal due to an inadequate volume of blood received in culture bottles   Culture   Final    NO GROWTH 5 DAYS Performed at [facility], [address] [place], [place] [address]    Report Status 08/25/2023 FINAL  Final  SARS Coronavirus 2 by RT PCR (hospital order, performed in [facility] hospital lab) *cepheid single result test* Anterior Nasal Swab     Status: None   Collection Time: 08/20/23   5:09 PM   Specimen: Anterior Nasal Swab  Result Value Ref Range Status   SARS Coronavirus 2 by RT PCR NEGATIVE NEGATIVE Final    Comment: (NOTE) SARS-CoV-2 target nucleic acids are NOT DETECTED.  The SARS-CoV-2 RNA is generally detectable in upper and lower respiratory specimens during the acute phase of infection. The lowest concentration of SARS-CoV-2 viral copies this assay can detect is 250 copies / mL. A negative result does not preclude SARS-CoV-2 infection and should not be used as the sole basis for treatment or other patient management decisions.  A negative result may occur with improper specimen collection / handling, submission of specimen other than nasopharyngeal swab, presence of viral mutation(s) within the areas targeted by this assay, and inadequate number of viral copies (<250 copies / mL). A negative result must be combined with clinical observations, patient history, and epidemiological information.  Fact Sheet for Patients:   RoadLapTop.co.za  Fact Sheet for Healthcare Providers: http://kim-miller.com/  This test is not yet approved or  cleared by the [place] FDA and has been authorized for detection and/or diagnosis of SARS-CoV-2 by FDA under an Emergency Use Authorization (EUA).  This EUA will remain in effect (meaning this test can be used) for the duration of the COVID-19 declaration under Section 564(b)(1) of the  Act, 21 U.S.C. section 360bbb-3(b)(1), unless the authorization is terminated or revoked sooner.  Performed at [facility], [address] [place], [place] [address]   Blood culture (routine x 2)     Status: None   Collection Time: 08/20/23  7:09 PM   Specimen: BLOOD  Result Value Ref Range Status   Specimen Description BLOOD LEFT ANTECUBITAL  Final   Special Requests   Final    BOTTLES DRAWN AEROBIC AND ANAEROBIC Blood Culture adequate volume   Culture   Final    NO GROWTH 5  DAYS Performed at [facility], [address] [place], [place] [address]    Report Status 08/25/2023 FINAL  Final  Expectorated Sputum Assessment w Gram Stain, Rflx to Resp Cult     Status: None   Collection Time: 08/20/23 11:48 PM   Specimen: Sputum  Result Value Ref Range Status   Specimen Description SPUTUM  Final   Special Requests NONE  Final   Sputum evaluation   Final    THIS SPECIMEN IS ACCEPTABLE FOR SPUTUM CULTURE Performed at [facility], [address] [place], [place] [address]    Report Status 08/21/2023 FINAL  Final  Culture, Respiratory w Gram Stain     Status: None   Collection Time: 08/20/23 11:48 PM   Specimen: SPU  Result Value Ref Range Status   Specimen Description   Final    SPUTUM Performed at [facility], [address] [place], [place] [address]    Special Requests   Final    NONE Reflexed from [id number] Performed at [facility], [address] [place], [place] [address]    Gram Stain   Final    FEW SQUAMOUS EPITHELIAL CELLS PRESENT NO WBC SEEN RARE GRAM POSITIVE COCCI Performed at [facility], [address], [place], [place] [address]    Culture   Final    MODERATE PROTEUS PENNERI MODERATE STENOTROPHOMONAS MALTOPHILIA FEW CITROBACTER FREUNDII    Report Status 08/25/2023 FINAL  Final   Organism ID, Bacteria STENOTROPHOMONAS MALTOPHILIA  Final   Organism ID, Bacteria CITROBACTER FREUNDII  Final   Organism ID, Bacteria PROTEUS PENNERI  Final      Susceptibility   Citrobacter freundii - MIC*    CEFEPIME <=0.12 SENSITIVE Sensitive     CEFTAZIDIME <=1 SENSITIVE Sensitive     CEFTRIAXONE <=0.25 SENSITIVE Sensitive     CIPROFLOXACIN <=0.25 SENSITIVE Sensitive     GENTAMICIN <=1 SENSITIVE Sensitive     IMIPENEM <=0.25 SENSITIVE Sensitive     TRIMETH/SULFA <=20 SENSITIVE Sensitive     PIP/TAZO <=4 SENSITIVE Sensitive ug/mL    * FEW CITROBACTER FREUNDII   Proteus penneri - MIC*    AMPICILLIN  >=32 RESISTANT Resistant     CEFEPIME <=0.12 SENSITIVE Sensitive     CEFTAZIDIME 2 SENSITIVE Sensitive     CEFTRIAXONE 32 RESISTANT Resistant     CIPROFLOXACIN <=0.25 SENSITIVE Sensitive     GENTAMICIN <=1 SENSITIVE Sensitive     IMIPENEM 2 SENSITIVE Sensitive     TRIMETH/SULFA <=20 SENSITIVE Sensitive     AMPICILLIN/SULBACTAM 16 INTERMEDIATE Intermediate     PIP/TAZO <=4 SENSITIVE Sensitive ug/mL    * MODERATE PROTEUS PENNERI   Stenotrophomonas maltophilia - MIC*    LEVOFLOXACIN 1 SENSITIVE Sensitive     TRIMETH/SULFA <=20 SENSITIVE Sensitive     * MODERATE STENOTROPHOMONAS MALTOPHILIA    Radiology Studies: DG Chest Port 1 View  Result Date: 08/25/2023 CLINICAL DATA:  [id number]  with shortness of breath. EXAM: PORTABLE CHEST 1 VIEW COMPARISON:  CTA chest 08/20/2023 FINDINGS: 4:07 a.m. There are low lung volumes, with subpleural fibrotic changes. No focal pneumonia is evident. There is no substantial pleural effusion. The cardiomediastinal silhouette is stable, with no evidence of CHF. There is aortic atherosclerosis. Multiple left axillary surgical clips. Thoracic spondylosis. IMPRESSION: Low lung volumes with subpleural fibrotic changes. No evidence of acute chest disease. Electronically Signed   By: Almira Bar M.D.   On: 08/25/2023 06:29    Scheduled Meds:  apixaban  5 mg Oral BID   atorvastatin  20 mg Oral Daily   bisacodyl  10 mg Oral QHS   cyanocobalamin  1,000 mcg Oral Daily   folic acid  1 mg Oral Daily   gabapentin  600 mg Oral QHS   mometasone-formoterol  2 puff Inhalation BID   pantoprazole  40 mg Oral BID   polyethylene glycol  17 g Oral BID   potassium chloride  40 mEq Oral Once   Vitamin D (Ergocalciferol)  50,000 Units Oral Q7 days   Continuous Infusions:   LOS: 1 day   Marguerita Merles, DO [facility] Available via Epic secure chat 7am-7pm After these hours, please refer to coverage provider listed on [email or web address] 08/26/2023, 10:51 AM

## 2023-08-27 DIAGNOSIS — J439 Emphysema, unspecified: Secondary | ICD-10-CM

## 2023-08-27 DIAGNOSIS — E876 Hypokalemia: Secondary | ICD-10-CM | POA: Diagnosis not present

## 2023-08-27 DIAGNOSIS — I951 Orthostatic hypotension: Secondary | ICD-10-CM | POA: Diagnosis not present

## 2023-08-27 DIAGNOSIS — E1122 Type 2 diabetes mellitus with diabetic chronic kidney disease: Secondary | ICD-10-CM

## 2023-08-27 DIAGNOSIS — D649 Anemia, unspecified: Secondary | ICD-10-CM | POA: Diagnosis not present

## 2023-08-27 DIAGNOSIS — I10 Essential (primary) hypertension: Secondary | ICD-10-CM

## 2023-08-27 LAB — CBC WITH DIFFERENTIAL/PLATELET
Abs Immature Granulocytes: 0.04 10*3/uL (ref 0.00–0.07)
Basophils Absolute: 0.1 10*3/uL (ref 0.0–0.1)
Basophils Relative: 1 %
Eosinophils Absolute: 0.4 10*3/uL (ref 0.0–0.5)
Eosinophils Relative: 5 %
HCT: 24.1 % — ABNORMAL LOW (ref 36.0–46.0)
Hemoglobin: 8.3 g/dL — ABNORMAL LOW (ref 12.0–15.0)
Immature Granulocytes: 1 %
Lymphocytes Relative: 21 %
Lymphs Abs: 1.7 10*3/uL (ref 0.7–4.0)
MCH: 29.4 pg (ref 26.0–34.0)
MCHC: 34.4 g/dL (ref 30.0–36.0)
MCV: 85.5 fL (ref 80.0–100.0)
Monocytes Absolute: 0.6 10*3/uL (ref 0.1–1.0)
Monocytes Relative: 7 %
Neutro Abs: 5.2 10*3/uL (ref 1.7–7.7)
Neutrophils Relative %: 65 %
Platelets: 216 10*3/uL (ref 150–400)
RBC: 2.82 MIL/uL — ABNORMAL LOW (ref 3.87–5.11)
RDW: 19.2 % — ABNORMAL HIGH (ref 11.5–15.5)
WBC: 7.9 10*3/uL (ref 4.0–10.5)
nRBC: 0 % (ref 0.0–0.2)

## 2023-08-27 LAB — COMPREHENSIVE METABOLIC PANEL
ALT: 10 U/L (ref 0–44)
AST: 16 U/L (ref 15–41)
Albumin: 3.1 g/dL — ABNORMAL LOW (ref 3.5–5.0)
Alkaline Phosphatase: 62 U/L (ref 38–126)
Anion gap: 6 (ref 5–15)
BUN: 13 mg/dL (ref 8–23)
CO2: 27 mmol/L (ref 22–32)
Calcium: 8.5 mg/dL — ABNORMAL LOW (ref 8.9–10.3)
Chloride: 107 mmol/L (ref 98–111)
Creatinine, Ser: 1.09 mg/dL — ABNORMAL HIGH (ref 0.44–1.00)
GFR, Estimated: 51 mL/min — ABNORMAL LOW (ref >60)
Glucose, Bld: 101 mg/dL — ABNORMAL HIGH (ref 70–99)
Potassium: 4 mmol/L (ref 3.5–5.1)
Sodium: 140 mmol/L (ref 135–145)
Total Bilirubin: 0.4 mg/dL (ref <1.2)
Total Protein: 6.3 g/dL — ABNORMAL LOW (ref 6.5–8.1)

## 2023-08-27 LAB — MAGNESIUM: Magnesium: 2.2 mg/dL (ref 1.7–2.4)

## 2023-08-27 LAB — PHOSPHORUS: Phosphorus: 3.1 mg/dL (ref 2.5–4.6)

## 2023-08-27 NOTE — Progress Notes (Addendum)
Physical Therapy Treatment Patient Details Name: Tracy Dickerson MRN: [id number] DOB: 1941/01/17 Today's Date: 08/27/2023   History of Present Illness Pt is an 82 y.o. female presenting to hospital 08/20/23 with c/o generalized weakness and fall.  Pt found to have bed bugs.  Pt admitted with syncope, hypokalemia, and sympomatic anemia.  PMH includes htn, HLD, DM, breast CA, L breast surgery, L tib/fib ORIF, CKD, septic shock d/t UTI, PAF.    PT Comments  Pt was just returning to bed with RN staff upon arrival. She and staff endorses being up to BR and sitting in recliner earlier this date. Pt was agreeable to OOB/ambulation however only wanted to walk with RW due to fear of falling. She does not ambulate with RW at baseline and does have balance deficits noted. Does not endorse any symptoms of dizziness or lightheadedness throughout.  She will greatly benefit from continued skilled PT to maximize her independence and safety with all ADLs. Acute PT will continue to follow per current POC.    If plan is discharge home, recommend the following: A little help with walking and/or transfers;A little help with bathing/dressing/bathroom;Assistance with cooking/housework;Assist for transportation;Help with stairs or ramp for entrance     Equipment Recommendations  Rolling walker (2 wheels);BSC/3in1       Precautions / Restrictions Precautions Precautions: Fall Precaution Comments: Bed bugs noted in ED Restrictions Weight Bearing Restrictions: No     Mobility  Bed Mobility Overal bed mobility: Modified Independent Bed Mobility: Supine to Sit, Sit to Supine  Supine to sit: Supervision, Used rails Sit to supine: Used rails, Supervision   Transfers Overall transfer level: Needs assistance Equipment used: Rolling walker (2 wheels) Transfers: Sit to/from Stand Sit to Stand: Contact guard assist, Min assist  General transfer comment: Min assist from lowest bed height, CGA for elevated surfaces     Ambulation/Gait Ambulation/Gait assistance: Contact guard assist Gait Distance (Feet): 120 Feet Assistive device: Rolling walker (2 wheels) Gait Pattern/deviations: Step-through pattern, Decreased step length - right, Decreased step length - left Gait velocity: decreased  General Gait Details: CGA for safety. vcs for improved safety with turning. Pt does not usually use RW however pt unwilling to attempt gait without at this time due to fear of falling.   Balance Overall balance assessment: Needs assistance Sitting-balance support: No upper extremity supported, Feet supported Sitting balance-Leahy Scale: Good     Standing balance support: Bilateral upper extremity supported, During functional activity Standing balance-Leahy Scale: Good Standing balance comment: pt has fair balance with use of AD however remains high fall risk without. Would benefit from continued PT to maximize safety and independece with ADLs. did not use RW at baseline.    Cognition Arousal: Alert Behavior During Therapy: WFL for tasks assessed/performed Overall Cognitive Status: No family/caregiver present to determine baseline cognitive functioning            Pertinent Vitals/Pain Pain Assessment Pain Assessment: No/denies pain Pain Score: 0-No pain     PT Goals (current goals can now be found in the care plan section) Acute Rehab PT Goals Patient Stated Goal: none stated Progress towards PT goals: Progressing toward goals    Frequency    Min 1X/week       AM-PAC PT "6 Clicks" Mobility   Outcome Measure  Help needed turning from your back to your side while in a flat bed without using bedrails?: None Help needed moving from lying on your back to sitting on the side of a flat bed  without using bedrails?: None Help needed moving to and from a bed to a chair (including a wheelchair)?: A Little Help needed standing up from a chair using your arms (e.g., wheelchair or bedside chair)?: A  Little Help needed to walk in hospital room?: A Little Help needed climbing 3-5 steps with a railing? : A Lot 6 Click Score: 19    End of Session   Activity Tolerance: Patient tolerated treatment well Patient left: in bed;with call bell/phone within reach;with bed alarm set Nurse Communication: Mobility status;Precautions PT Visit Diagnosis: Unsteadiness on feet (R26.81);Other abnormalities of gait and mobility (R26.89);Muscle weakness (generalized) (M62.81);History of falling (Z91.81)     Time: 0951-1000 PT Time Calculation (min) (ACUTE ONLY): 9 min  Charges:    $Gait Training: 8-22 mins PT General Charges $$ ACUTE PT VISIT: 1 Visit                    Jetta Lout PTA 08/27/23, 10:52 AM

## 2023-08-27 NOTE — Assessment & Plan Note (Signed)
Patient back on Eliquis

## 2023-08-27 NOTE — Assessment & Plan Note (Addendum)
Likely the cause of her syncope.  Continue TED hose.  Likely deconditioning playing a role.  Continue midodrine.  A.m. cortisol came back low at 4.9.  Patient has not been on any steroids.  Will check a cosyntropin test tomorrow to see if patient has adrenal insufficiency.

## 2023-08-27 NOTE — Assessment & Plan Note (Signed)
Replaced. °

## 2023-08-27 NOTE — Assessment & Plan Note (Signed)
BMI 33.27 with current height and weight in computer.

## 2023-08-27 NOTE — Assessment & Plan Note (Signed)
Holding antihypertensive medications secondary to orthostatic hypotension.

## 2023-08-27 NOTE — Assessment & Plan Note (Addendum)
Last hemoglobin 8.9

## 2023-08-27 NOTE — Assessment & Plan Note (Signed)
Last hemoglobin A1c low at 5.9

## 2023-08-27 NOTE — Assessment & Plan Note (Addendum)
Last hemoglobin 9.2.  Received 1 unit of packed red blood cells during hospital course.  Will give a dose of IV iron.

## 2023-08-27 NOTE — Assessment & Plan Note (Addendum)
Small bowel capsule study unclear if reached the cecum.  No bleeding seen on what they did see.  Colonoscopy poor prep did not see any bleeding.  EGD negative.  Since we did not get insurance authorization today I will give IV iron.

## 2023-08-27 NOTE — Assessment & Plan Note (Addendum)
Creatinine 1.11 with a GFR of 50

## 2023-08-27 NOTE — Assessment & Plan Note (Signed)
Respiratory status stable.  Sputum culture colonization as per ID.

## 2023-08-27 NOTE — TOC Progression Note (Signed)
Transition of Care [facility]) - Progression Note    Patient Details  Name: Tracy Dickerson MRN: [id number] Date of Birth: [birth year]  [facility] CM/SW Contact  Garret Reddish, RN Phone Number: 08/27/2023, 2:45 PM  Clinical Narrative:    I have attempted to speak with patient's brother multiple times today.  Each time I have went to the room that brother has been out of the room.    I will continue in my efforts to reach out to patient's brother to discuss bed offers and discharge planning.   TOC will continue to follow progress of patient.     Expected Discharge Plan: Skilled Nursing Facility Barriers to Discharge: Continued Medical Work up  Expected Discharge Plan and Services       Living arrangements for the past 2 months: Single Family Home                                       Social Determinants of Health (SDOH) Interventions SDOH Screenings   Food Insecurity: No Food Insecurity (08/21/2023)  Housing: Medium Risk (08/21/2023)  Transportation Needs: No Transportation Needs (08/21/2023)  Utilities: Not At Risk (08/21/2023)  Financial Resource Strain: Low Risk  (02/04/2023)   [facility], [facility]  Physical Activity: Inactive (07/16/2021)   Received [facility], [facility]  Stress: No Stress Concern Present (07/16/2021)   Received from [facility], [facility]  Tobacco Use: Medium Risk (08/23/2023)  Health Literacy: Medium Risk (07/16/2021)   Received from [facility], [facility]    Readmission Risk Interventions     No data to display

## 2023-08-27 NOTE — Plan of Care (Signed)

## 2023-08-27 NOTE — Progress Notes (Signed)
Progress Note   Patient: Tracy Dickerson XBJ:[id number] DOB: Aug 10, 1941 DOA: 08/20/2023     2 DOS: the patient was seen and examined on 08/27/2023   Brief hospital course: HPI per Dr. Lorretta Harp on 08/20/23  Tracy Dickerson is a 82 y.o. female with medical history significant of hypertension, hyperlipidemia, diabetes mellitus, GERD, breast cancer, CKD-3a, septic shock due to UTI, PAF on Eliquis, who presents with generalized weakness, syncope, SOB and cough.   Pt states that over the last 2 weeks, she has had progressive worsening generalized weakness. She has dizziness and lightheadedness. She passed out twice today.  No unilateral numbness or tinglings in extremities.  No facial droop or slurred speech.  She has dry cough and shortness of breath.  She had some chest pain in the morning, which has resolved.  No fever or chills.  No nausea, vomiting, diarrhea or abdominal pain.  No symptoms of UTI.  Patient states that she ran out of her medications for more than 2 weeks.  She did not take her medications in the past 2 weeks, including Eliquis.  Per ED RN, pt was found to bed bugs.    Data reviewed independently and ED Course: pt was found to have WBC 7.7, trop 12, D-dimer 1.85, BNP 233, stable renal function, potassium 3.1, negative COVID PCR, temperature normal, blood pressure 110/59, heart rate 68, RR 20, oxygen saturation 95% on room air.  CT of the head and neck is negative for acute injury.  Patient is placed on telemetry bed for observation.   Chest x-ray: Slight increase in lung base opacities. Subtle infiltrate is possible. Recommend follow-up.   Underlying fibrotic, interstitial changes again seen of the lungs. Please correlate with the history. Elevation of the right hemidiaphragm   **Interim History Since Eliquis has been held given her drop in hemoglobin.  She was typed and screened and transfused 1 unit PRBCs.  Continues to be significant orthostatic.  GI is planning for an EGD and  colonoscopy with possible video capsule endoscopy and they were done.  EGD was normal and colonoscopy was a very poor prep.  She underwent a VCE and the study was incomplete and there is unclear if the capsule reached which the cecum given that there is digested material in the entire small bowel.  The study was suboptimal but there was no visible blood in the small bowel.  GI recommended resuming anticoagulation at this time with outpatient GI follow-up and repeating VCE and following up with hematology for IV iron infusion referrals.  Repeat orthostatic vital signs but she appears to be medically stable.  Currently disposition is pending pending TOC and SNF Authorization.   10/4.  Patient feeling okay.  Offers no complaints.  Patient is orthostatic but does not drop down enough to cause symptoms.    Assessment and Plan: * Orthostatic hypotension Continue TED hose and eating and drinking.  Likely the cause of her syncope.  Hypokalemia Replaced  Acute on chronic anemia Last hemoglobin 8.3.  Received 1 unit of packed red blood cells during hospital course.  COPD (chronic obstructive pulmonary disease) (HCC) Respiratory status stable.  Sputum culture colonization as per ID.  HTN (hypertension) Holding antihypertensive medications secondary to orthostatic hypotension.  CKD stage 3a, GFR 45-59 ml/min (HCC) Creatinine 1.09 with a GFR of 51  PAF (paroxysmal atrial fibrillation) (HCC) Patient back on Eliquis  Normocytic anemia Last hemoglobin 8.3  Type II diabetes mellitus with renal manifestations (HCC) Last hemoglobin A1c low at 5.9  Obesity (BMI 30-39.9) BMI 33.27 with current height and weight in computer.  Iron deficiency anemia due to chronic blood loss Small bowel capsule study unclear if reached the cecum.  No bleeding seen on what they did see.  Colonoscopy poor prep did not see any bleeding.  EGD negative.        Subjective: Patient feeling okay.  Offers no  complaints.  Physical therapy recommending rehab.  Physical Exam: Vitals:   08/27/23 0340 08/27/23 0846 08/27/23 0851 08/27/23 0856  BP: (!) 143/69 (!) 155/58 (!) 156/71 129/72  Pulse: 68 78 75 85  Resp: 16     Temp: 97.7 F (36.5 C) 97.7 F (36.5 C)    TempSrc: Oral     SpO2: 97% 99%    Weight:      Height:       Physical Exam HENT:     Head: Normocephalic.     Mouth/Throat:     Pharynx: No oropharyngeal exudate.  Eyes:     General: Lids are normal.     Conjunctiva/sclera: Conjunctivae normal.  Cardiovascular:     Rate and Rhythm: Normal rate and regular rhythm.     Heart sounds: Normal heart sounds, S1 normal and S2 normal.  Pulmonary:     Breath sounds: No decreased breath sounds, wheezing, rhonchi or rales.  Abdominal:     Palpations: Abdomen is soft.     Tenderness: There is no abdominal tenderness.  Musculoskeletal:     Right lower leg: No swelling.     Left lower leg: No swelling.  Skin:    General: Skin is warm.     Findings: No rash.  Neurological:     Mental Status: She is alert.     Data Reviewed: Creatinine 1.09  Family Communication: Spoke with brother at the bedside  Disposition: Status is: Inpatient Remains inpatient appropriate because: We would need to have a disposition plan.  DSS involved.  TOC trying to contact family about sending urine to a rehab.  Planned Discharge Destination: Skilled nursing facility    Time spent: 28 minutes  Author: Alford Highland, MD 08/27/2023 1:43 PM  For on call review [email or web address].

## 2023-08-28 DIAGNOSIS — E538 Deficiency of other specified B group vitamins: Secondary | ICD-10-CM

## 2023-08-28 DIAGNOSIS — D649 Anemia, unspecified: Secondary | ICD-10-CM | POA: Diagnosis not present

## 2023-08-28 DIAGNOSIS — I10 Essential (primary) hypertension: Secondary | ICD-10-CM | POA: Diagnosis not present

## 2023-08-28 DIAGNOSIS — I951 Orthostatic hypotension: Secondary | ICD-10-CM | POA: Diagnosis not present

## 2023-08-28 DIAGNOSIS — J439 Emphysema, unspecified: Secondary | ICD-10-CM | POA: Diagnosis not present

## 2023-08-28 DIAGNOSIS — E559 Vitamin D deficiency, unspecified: Secondary | ICD-10-CM

## 2023-08-28 LAB — GLUCOSE, CAPILLARY
Glucose-Capillary: 87 mg/dL (ref 70–99)
Glucose-Capillary: 152 mg/dL — ABNORMAL HIGH (ref 70–99)
Glucose-Capillary: 117 mg/dL — ABNORMAL HIGH (ref 70–99)

## 2023-08-28 NOTE — Progress Notes (Signed)
Progress Note   Patient: Tracy Dickerson VOZ:[id number] DOB: Aug 23, 1941 DOA: 08/20/2023     3 DOS: the patient was seen and examined on 08/28/2023   Brief hospital course: HPI per Dr. Lorretta Harp on 08/20/23  Tracy Dickerson is a 82 y.o. female with medical history significant of hypertension, hyperlipidemia, diabetes mellitus, GERD, breast cancer, CKD-3a, septic shock due to UTI, PAF on Eliquis, who presents with generalized weakness, syncope, SOB and cough.   Pt states that over the last 2 weeks, she has had progressive worsening generalized weakness. She has dizziness and lightheadedness. She passed out twice today.  No unilateral numbness or tinglings in extremities.  No facial droop or slurred speech.  She has dry cough and shortness of breath.  She had some chest pain in the morning, which has resolved.  No fever or chills.  No nausea, vomiting, diarrhea or abdominal pain.  No symptoms of UTI.  Patient states that she ran out of her medications for more than 2 weeks.  She did not take her medications in the past 2 weeks, including Eliquis.  Per ED RN, pt was found to bed bugs.    Data reviewed independently and ED Course: pt was found to have WBC 7.7, trop 12, D-dimer 1.85, BNP 233, stable renal function, potassium 3.1, negative COVID PCR, temperature normal, blood pressure 110/59, heart rate 68, RR 20, oxygen saturation 95% on room air.  CT of the head and neck is negative for acute injury.  Patient is placed on telemetry bed for observation.    **Interim History Since Eliquis has been held given her drop in hemoglobin.  She was typed and screened and transfused 1 unit PRBCs.  Continues to be significant orthostatic.  GI is planning for an EGD and colonoscopy with possible video capsule endoscopy and they were done.  EGD was normal and colonoscopy was a very poor prep.  She underwent a VCE and the study was incomplete and there is unclear if the capsule reached which the cecum given that there is  digested material in the entire small bowel.  The study was suboptimal but there was no visible blood in the small bowel.  GI recommended resuming anticoagulation at this time with outpatient GI follow-up and repeating VCE and following up with hematology for IV iron infusion referrals.  Repeat orthostatic vital signs but she appears to be medically stable.  Currently disposition is pending pending TOC and SNF Authorization.   12/4.  Patient feeling okay.  Offers no complaints.  Patient is orthostatic but does not drop down enough to cause symptoms. 12/5.  Patient feels okay.  TOC trying to contact family to see if they can sign her into a rehab.     Assessment and Plan: * Orthostatic hypotension Continue TED hose and eating and drinking.  Likely the cause of her syncope.  Hypokalemia Replaced  Acute on chronic anemia Last hemoglobin 8.3.  Received 1 unit of packed red blood cells during hospital course.  COPD (chronic obstructive pulmonary disease) (HCC) Respiratory status stable.  Sputum culture colonization as per ID.  HTN (hypertension) Holding antihypertensive medications secondary to orthostatic hypotension.  CKD stage 3a, GFR 45-59 ml/min (HCC) Creatinine 1.09 with a GFR of 51  PAF (paroxysmal atrial fibrillation) (HCC) Patient back on Eliquis  Normocytic anemia Last hemoglobin 8.3  Type II diabetes mellitus with renal manifestations (HCC) Last hemoglobin A1c low at 5.9  Obesity (BMI 30-39.9) BMI 33.27 with current height and weight in computer.  Vitamin D deficiency On vitamin D supplementation.  Iron deficiency anemia due to chronic blood loss Small bowel capsule study unclear if reached the cecum.  No bleeding seen on what they did see.  Colonoscopy poor prep did not see any bleeding.  EGD negative.  Vitamin B12 deficiency Currently on oral B12 supplementation        Subjective: Patient feels okay.  Offers no complaints.  Initially admitted with  syncope.  Found to have orthostatic hypotension.  Physical Exam: Vitals:   08/27/23 0856 08/27/23 1733 08/27/23 2135 08/28/23 0818  BP: 129/72 (!) 141/63 (!) 157/78 130/66  Pulse: 85 82 79 72  Resp:   18 18  Temp:  (!) 97.4 F (36.3 C) 97.8 F (36.6 C) 97.7 F (36.5 C)  TempSrc:    Oral  SpO2:  99% 95% 97%  Weight:      Height:       Physical Exam HENT:     Head: Normocephalic.     Mouth/Throat:     Pharynx: No oropharyngeal exudate.  Eyes:     General: Lids are normal.     Conjunctiva/sclera: Conjunctivae normal.  Cardiovascular:     Rate and Rhythm: Normal rate and regular rhythm.     Heart sounds: Normal heart sounds, S1 normal and S2 normal.  Pulmonary:     Breath sounds: No decreased breath sounds, wheezing, rhonchi or rales.  Abdominal:     Palpations: Abdomen is soft.     Tenderness: There is no abdominal tenderness.  Musculoskeletal:     Right lower leg: No swelling.     Left lower leg: No swelling.  Skin:    General: Skin is warm.     Findings: No rash.  Neurological:     Mental Status: She is alert.     Data Reviewed: Last creatinine 1.09, last hemoglobin 8.3 Family Communication: Left message for brother  Disposition: Status is: Inpatient Remains inpatient appropriate because: Transitional care team trying to find brother to have them be able to sign her into a rehab.  Planned Discharge Destination: Rehab    Time spent: 28 minutes  Author: Alford Highland, MD 08/28/2023 3:50 PM  For on call review [email or web address].

## 2023-08-28 NOTE — Assessment & Plan Note (Signed)
On vitamin D supplementation.

## 2023-08-28 NOTE — Care Management Important Message (Signed)
Important Message  Patient Details  Name: Tracy Dickerson MRN: [id number] Date of Birth: [birth year]   Important Message Given:  N/A - LOS <3 / Initial given by admissions     Olegario Messier A Kendale Rembold 08/28/2023, 11:28 AM

## 2023-08-28 NOTE — Assessment & Plan Note (Signed)
Currently on oral B12 supplementation

## 2023-08-29 DIAGNOSIS — E876 Hypokalemia: Secondary | ICD-10-CM | POA: Diagnosis not present

## 2023-08-29 DIAGNOSIS — J439 Emphysema, unspecified: Secondary | ICD-10-CM | POA: Diagnosis not present

## 2023-08-29 DIAGNOSIS — I951 Orthostatic hypotension: Secondary | ICD-10-CM | POA: Diagnosis not present

## 2023-08-29 DIAGNOSIS — D649 Anemia, unspecified: Secondary | ICD-10-CM | POA: Diagnosis not present

## 2023-08-29 DIAGNOSIS — D508 Other iron deficiency anemias: Secondary | ICD-10-CM

## 2023-08-29 LAB — GLUCOSE, CAPILLARY
Glucose-Capillary: 84 mg/dL (ref 70–99)
Glucose-Capillary: 89 mg/dL (ref 70–99)

## 2023-08-29 NOTE — TOC Progression Note (Signed)
Transition of Care [facility]) - Progression Note    Patient Details  Name: Tracy Dickerson MRN: [id number] Date of Birth: April 09, 1941  [facility] CM/SW Contact  Garret Reddish, RN Phone Number: 08/29/2023, 4:30 PM  Clinical Narrative:    Chart reviewed.  SNF authorization submitted.  SNF authorization pending.  SNF pending Berk[facility] Harvey ID is [id number].  TOC will continue to follow for discharge planning.     Expected Discharge Plan: Skilled Nursing Facility Barriers to Discharge: Continued Medical Work up  Expected Discharge Plan and Services       Living arrangements for the past 2 months: Single Family Home                                       Social Determinants of Health (SDOH) Interventions SDOH Screenings   Food Insecurity: No Food Insecurity (08/21/2023)  Housing: Medium Risk (08/21/2023)  Transportation Needs: No Transportation Needs (08/21/2023)  Utilities: Not At Risk (08/21/2023)  Financial Resource Strain: Low Risk  (02/04/2023)   [facility], [facility]  Physical Activity: Inactive (07/16/2021)   Received from [facility], [facility]  Stress: No Stress Concern Present (07/16/2021)   Received from [facility], [facility]  Tobacco Use: Medium Risk (08/23/2023)  Health Literacy: Medium Risk (07/16/2021)   Received from [facility], [facility]    Readmission Risk Interventions     No data to display

## 2023-08-29 NOTE — TOC Progression Note (Signed)
Transition of Care [facility]) - Progression Note    Patient Details  Name: Tracy Dickerson MRN: [id number] Date of Birth: Oct 29, 1940  Transition [facility] Care [facility]) CM/SW Contact  Garret Reddish, RN Phone Number: 08/29/2023, 12:29 PM  Clinical Narrative:    Chart reviewed.  I have spoken with patient's brother Tracy Dickerson.  I have reviewed bed offers with patient's brother.  He informs me that he would like for patient to go to [facility]. Tracy Dickerson is agreeable to signing patient into the facility.    I have spoken with Toni Amend, Admissions Coordinator with [facility].  She reports that they should have a bed for patient on Monday.    TOC will start SNF authorization.      Expected Discharge Plan: Skilled Nursing Facility Barriers to Discharge: Continued Medical Work up  Expected Discharge Plan and Services       Living arrangements for the past 2 months: Single Family Home                                       Social Determinants of Health (SDOH) Interventions SDOH Screenings   Food Insecurity: No Food Insecurity (08/21/2023)  Housing: Medium Risk (08/21/2023)  Transportation Needs: No Transportation Needs (08/21/2023)  Utilities: Not At Risk (08/21/2023)  Financial Resource Strain: Low Risk  (02/04/2023)   Received from [facility], [facility]  Physical Activity: Inactive (07/16/2021)   Received from [facility], [facility]  Stress: No Stress Concern Present (07/16/2021)   Received from [facility], [facility]  Tobacco Use: Medium Risk (08/23/2023)  Health Literacy: Medium Risk (07/16/2021)   Received from [facility], [facility]    Readmission Risk Interventions     No data to display

## 2023-08-29 NOTE — Progress Notes (Signed)
PT Cancellation Note  Patient Details Name: Tracy Dickerson MRN: [id number] DOB: 05/12/1941   Cancelled Treatment:     PT attempt. Pt currently working with OT. Acute PT will continue to follow and progress per current POC. Will return at a later time/date.    Rushie Chestnut 08/29/2023, 2:22 PM

## 2023-08-29 NOTE — Progress Notes (Signed)
Progress Note   Patient: Tracy Dickerson [id number]:[id number] DOB: August 19, 1941 DOA: 08/20/2023     4 DOS: the patient was seen and examined on 08/29/2023   Brief hospital course: HPI per Dr. Lorretta Harp on 08/20/23  Tracy Dickerson is a 82 y.o. female with medical history significant of hypertension, hyperlipidemia, diabetes mellitus, GERD, breast cancer, CKD-3a, septic shock due to UTI, PAF on Eliquis, who presents with generalized weakness, syncope, SOB and cough.   Pt states that over the last 2 weeks, she has had progressive worsening generalized weakness. She has dizziness and lightheadedness. She passed out twice today.  No unilateral numbness or tinglings in extremities.  No facial droop or slurred speech.  She has dry cough and shortness of breath.  She had some chest pain in the morning, which has resolved.  No fever or chills.  No nausea, vomiting, diarrhea or abdominal pain.  No symptoms of UTI.  Patient states that she ran out of her medications for more than 2 weeks.  She did not take her medications in the past 2 weeks, including Eliquis.  Per ED RN, pt was found to bed bugs.    Data reviewed independently and ED Course: pt was found to have WBC 7.7, trop 12, D-dimer 1.85, BNP 233, stable renal function, potassium 3.1, negative COVID PCR, temperature normal, blood pressure 110/59, heart rate 68, RR 20, oxygen saturation 95% on room air.  CT of the head and neck is negative for acute injury.  Patient is placed on telemetry bed for observation.    **Interim History Since Eliquis has been held given her drop in hemoglobin.  She was typed and screened and transfused 1 unit PRBCs.  Continues to be significant orthostatic.  GI is planning for an EGD and colonoscopy with possible video capsule endoscopy and they were done.  EGD was normal and colonoscopy was a very poor prep.  She underwent a VCE and the study was incomplete and there is unclear if the capsule reached which the cecum given that there is  digested material in the entire small bowel.  The study was suboptimal but there was no visible blood in the small bowel.  GI recommended resuming anticoagulation at this time with outpatient GI follow-up and repeating VCE and following up with hematology for IV iron infusion referrals.  Repeat orthostatic vital signs but she appears to be medically stable.  Currently disposition is pending pending TOC and SNF Authorization.   12/4.  Patient feeling okay.  Offers no complaints.  Patient is orthostatic but does not drop down enough to cause symptoms. 12/5.  Patient feels okay.  TOC trying to contact family to see if they can sign her into a rehab. 12/6.  As per [facility] brother agreeable to sign her into rehab.     Assessment and Plan: * Orthostatic hypotension Continue TED hose and eating and drinking.  Likely the cause of her syncope.  Hypokalemia Replaced  Acute on chronic anemia Last hemoglobin 8.3.  Received 1 unit of packed red blood cells during hospital course.  COPD (chronic obstructive pulmonary disease) (HCC) Respiratory status stable.  Sputum culture colonization as per ID.  HTN (hypertension) Holding antihypertensive medications secondary to orthostatic hypotension.  CKD stage 3a, GFR 45-59 ml/min (HCC) Creatinine 1.09 with a GFR of 51  PAF (paroxysmal atrial fibrillation) (HCC) Patient back on Eliquis  Normocytic anemia Last hemoglobin 8.3  Type II diabetes mellitus with renal manifestations (HCC) Last hemoglobin A1c low at 5.9  Obesity (  BMI 30-39.9) BMI 33.27 with current height and weight in computer.  Vitamin D deficiency On vitamin D supplementation.  Iron deficiency anemia due to chronic blood loss Small bowel capsule study unclear if reached the cecum.  No bleeding seen on what they did see.  Colonoscopy poor prep did not see any bleeding.  EGD negative.  Vitamin B12 deficiency Currently on oral B12 supplementation        Subjective: Patient  physically feels okay.  Offers no complaints.  Admitted with syncope  Physical Exam: Vitals:   08/28/23 1803 08/29/23 0359 08/29/23 0724 08/29/23 1634  BP: 137/65 (!) 141/76 (!) 113/54 112/64  Pulse: 74 68 (!) 59 65  Resp: 20 18 17    Temp: 97.9 F (36.6 C) 98.3 F (36.8 C) 97.6 F (36.4 C) 97.6 F (36.4 C)  TempSrc:      SpO2: 98% 96% 94% 96%  Weight:      Height:       Physical Exam HENT:     Head: Normocephalic.     Mouth/Throat:     Pharynx: No oropharyngeal exudate.  Eyes:     General: Lids are normal.     Conjunctiva/sclera: Conjunctivae normal.  Cardiovascular:     Rate and Rhythm: Normal rate and regular rhythm.     Heart sounds: Normal heart sounds, S1 normal and S2 normal.  Pulmonary:     Breath sounds: No decreased breath sounds, wheezing, rhonchi or rales.  Abdominal:     Palpations: Abdomen is soft.     Tenderness: There is no abdominal tenderness.  Musculoskeletal:     Right lower leg: No swelling.     Left lower leg: No swelling.  Skin:    General: Skin is warm.     Findings: No rash.  Neurological:     Mental Status: She is alert.     Data Reviewed: No data today   Disposition: Status is: Inpatient Remains inpatient appropriate because: TOC was able to contact the brother to help sign her into rehab.  [facility] will have a bed on Monday.  TOC to start the insurance authorization process.  Planned Discharge Destination: Rehab    Time spent: 28 minutes  Author: Alford Highland, MD 08/29/2023 4:54 PM  For on call review [email or web address].

## 2023-08-29 NOTE — Plan of Care (Signed)

## 2023-08-29 NOTE — Progress Notes (Signed)
Occupational Therapy Treatment Patient Details Name: Tracy Dickerson MRN: [id number] DOB: Sep 25, 1940 Today's Date: 08/29/2023   History of present illness Pt is an 82 y.o. female presenting to hospital 08/20/23 with c/o generalized weakness and fall.  Pt found to have bed bugs.  Pt admitted with syncope, hypokalemia, and sympomatic anemia.  PMH includes htn, HLD, DM, breast CA, L breast surgery, L tib/fib ORIF, CKD, septic shock d/t UTI, PAF.   OT comments  Pt is supine in bed on arrival. Pleasant and agreeable to OT session. She denies pain. Pt performed bed mobility with MOD I and use of bed rail. She needed CGA for STS from EOB with mult trials required to reach stand and increased effort/time. Pt required CGA for short distance mobility to the bathroom and back and toilet transfer using grab bar with CGA. Hygiene and clothing management performed SBA. She stood at sink with constant CGA for balance, as she does lose balance with no UE support. Edu on constant unilateral support and alternating hands to perform bathing and dressing tasks. Provided Min/Mod A for thoroughness for LB bathing. Pt performed LB dressing of mesh panties seated EOB with Min A to pull over hips via sit to stand. Mild dizziness reported while standing at sink, but subsided. Remains high fall risk d/t LOB when she has no UE support.  Pt returned to bed with all needs in place and will cont to require skilled acute OT services to maximize her safety and IND to return to PLOF.       If plan is discharge home, recommend the following:  A little help with walking and/or transfers;A lot of help with bathing/dressing/bathroom;Assistance with cooking/housework;Assist for transportation;Help with stairs or ramp for entrance   Equipment Recommendations  BSC/3in1    Recommendations for Other Services      Precautions / Restrictions Precautions Precautions: Fall Precaution Comments: Bed bugs noted in ED Restrictions Weight  Bearing Restrictions: No       Mobility Bed Mobility Overal bed mobility: Modified Independent Bed Mobility: Supine to Sit, Sit to Supine     Supine to sit: Modified independent (Device/Increase time) Sit to supine: Modified independent (Device/Increase time)   General bed mobility comments: extra time and use of bedrail for supine<>sit    Transfers Overall transfer level: Needs assistance Equipment used: Rolling walker (2 wheels) Transfers: Sit to/from Stand Sit to Stand: Contact guard assist, Min assist           General transfer comment: CGA for STS from EOB x2 with mult attempts before successful/forward scooting and constant CGA and cueing for proper use of RW     Balance Overall balance assessment: Needs assistance Sitting-balance support: No upper extremity supported, Feet supported Sitting balance-Leahy Scale: Good Sitting balance - Comments: steady for LB dressing EOB   Standing balance support: Bilateral upper extremity supported, During functional activity, Reliant on assistive device for balance   Standing balance comment: high fall risk with no UE support, needs unilateral support at all times during dynamic tasks to prevent balance loss                           ADL either performed or assessed with clinical judgement   ADL Overall ADL's : Needs assistance/impaired         Upper Body Bathing: Contact guard assist;Standing   Lower Body Bathing: Minimal assistance;Sit to/from stand Lower Body Bathing Details (indicate cue type and reason): standing at  sink, Min/Mod A provided for thoroughness needs CGA support and constant unilateral support on sink to maintain balance Upper Body Dressing : Supervision/safety;Sitting   Lower Body Dressing: Sit to/from stand;Minimal assistance Lower Body Dressing Details (indicate cue type and reason): Min A to don mesh panties in standing to pull over hips; increased time to get over feet while seated  EOB Toilet Transfer: Contact guard assist;Grab bars;Rolling walker (2 wheels);Regular Teacher, adult education Details (indicate cue type and reason): CGA for safety with toilet transfer and use of grab bar Toileting- Clothing Manipulation and Hygiene: Supervision/safety;Sitting/lateral lean;Contact guard assist Toileting - Clothing Manipulation Details (indicate cue type and reason): SUP for hygiene and CGA/SBA for clothing management     Functional mobility during ADLs: Contact guard assist;Rolling walker (2 wheels)      Extremity/Trunk Assessment         Cervical / Trunk Assessment Cervical / Trunk Exceptions: forward head/shoulders    Vision       Perception     Praxis      Cognition Arousal: Alert Behavior During Therapy: WFL for tasks assessed/performed Overall Cognitive Status: No family/caregiver present to determine baseline cognitive functioning                                          Exercises Other Exercises Other Exercises: Edu on proper use of RW for transfers and mobility to prevent falls    Shoulder Instructions       General Comments mild dizziness with standing at sink that subsided and never became severe    Pertinent Vitals/ Pain       Pain Assessment Pain Assessment: No/denies pain Pain Intervention(s): Monitored during session  Home Living                                          Prior Functioning/Environment              Frequency  Min 1X/week        Progress Toward Goals  OT Goals(current goals can now be found in the care plan section)  Progress towards OT goals: Progressing toward goals  Acute Rehab OT Goals Patient Stated Goal: improve strength and balance OT Goal Formulation: With patient Time For Goal Achievement: 09/05/23 Potential to Achieve Goals: Good  Plan      Co-evaluation                 AM-PAC OT "6 Clicks" Daily Activity     Outcome Measure   Help from  another person eating meals?: None Help from another person taking care of personal grooming?: None Help from another person toileting, which includes using toliet, bedpan, or urinal?: A Little Help from another person bathing (including washing, rinsing, drying)?: A Little Help from another person to put on and taking off regular upper body clothing?: None Help from another person to put on and taking off regular lower body clothing?: A Little 6 Click Score: 21    End of Session Equipment Utilized During Treatment: Rolling walker (2 wheels)  OT Visit Diagnosis: Other abnormalities of gait and mobility (R26.89);Muscle weakness (generalized) (M62.81);Unsteadiness on feet (R26.81)   Activity Tolerance Patient tolerated treatment well   Patient Left in bed;with call bell/phone within reach;with bed alarm set   Nurse Communication  Mobility status        Time: 1417-1440 OT Time Calculation (min): 23 min  Charges: OT General Charges $OT Visit: 1 Visit OT Treatments $Self Care/Home Management : 23-37 mins  Khadijatou Borak, OTR/L  08/29/23, 2:55 PM   Shawanda Sievert E Nayah Lukens 08/29/2023, 2:51 PM

## 2023-08-30 DIAGNOSIS — E876 Hypokalemia: Secondary | ICD-10-CM | POA: Diagnosis not present

## 2023-08-30 DIAGNOSIS — R519 Headache, unspecified: Secondary | ICD-10-CM | POA: Insufficient documentation

## 2023-08-30 DIAGNOSIS — J439 Emphysema, unspecified: Secondary | ICD-10-CM | POA: Diagnosis not present

## 2023-08-30 DIAGNOSIS — D649 Anemia, unspecified: Secondary | ICD-10-CM | POA: Diagnosis not present

## 2023-08-30 DIAGNOSIS — I951 Orthostatic hypotension: Secondary | ICD-10-CM | POA: Diagnosis not present

## 2023-08-30 LAB — BASIC METABOLIC PANEL
Anion gap: 6 (ref 5–15)
BUN: 22 mg/dL (ref 8–23)
CO2: 28 mmol/L (ref 22–32)
Calcium: 8.7 mg/dL — ABNORMAL LOW (ref 8.9–10.3)
Chloride: 108 mmol/L (ref 98–111)
Creatinine, Ser: 1.11 mg/dL — ABNORMAL HIGH (ref 0.44–1.00)
GFR, Estimated: 50 mL/min — ABNORMAL LOW (ref >60)
Glucose, Bld: 90 mg/dL (ref 70–99)
Potassium: 4.1 mmol/L (ref 3.5–5.1)
Sodium: 142 mmol/L (ref 135–145)

## 2023-08-30 LAB — HEMOGLOBIN: Hemoglobin: 8.7 g/dL — ABNORMAL LOW (ref 12.0–15.0)

## 2023-08-30 LAB — GLUCOSE, CAPILLARY
Glucose-Capillary: 97 mg/dL (ref 70–99)
Glucose-Capillary: 81 mg/dL (ref 70–99)

## 2023-08-30 MED ORDER — MAGNESIUM SULFATE 2 GM/50ML IV SOLN
2.0000 g | Freq: Once | INTRAVENOUS | Status: AC
  Administered 2023-08-30: 2 g via INTRAVENOUS
  Filled 2023-08-30: qty 50

## 2023-08-30 NOTE — Assessment & Plan Note (Signed)
On admission. °

## 2023-08-30 NOTE — Assessment & Plan Note (Signed)
Tylenol.

## 2023-08-30 NOTE — Progress Notes (Signed)
Progress Note   Patient: Tracy Dickerson QMV:[id number] DOB: Jul 30, 1941 DOA: 08/20/2023     5 DOS: the patient was seen and examined on 08/30/2023   Brief hospital course: HPI per Dr. Lorretta Harp on 08/20/23  Tracy Dickerson is a 82 y.o. female with medical history significant of hypertension, hyperlipidemia, diabetes mellitus, GERD, breast cancer, CKD-3a, septic shock due to UTI, PAF on Eliquis, who presents with generalized weakness, syncope, SOB and cough.   Pt states that over the last 2 weeks, she has had progressive worsening generalized weakness. She has dizziness and lightheadedness. She passed out twice today.  No unilateral numbness or tinglings in extremities.  No facial droop or slurred speech.  She has dry cough and shortness of breath.  She had some chest pain in the morning, which has resolved.  No fever or chills.  No nausea, vomiting, diarrhea or abdominal pain.  No symptoms of UTI.  Patient states that she ran out of her medications for more than 2 weeks.  She did not take her medications in the past 2 weeks, including Eliquis.  Per ED RN, pt was found to bed bugs.    Data reviewed independently and ED Course: pt was found to have WBC 7.7, trop 12, D-dimer 1.85, BNP 233, stable renal function, potassium 3.1, negative COVID PCR, temperature normal, blood pressure 110/59, heart rate 68, RR 20, oxygen saturation 95% on room air.  CT of the head and neck is negative for acute injury.  Patient is placed on telemetry bed for observation.    **Interim History Since Eliquis has been held given her drop in hemoglobin.  She was typed and screened and transfused 1 unit PRBCs.  Continues to be significant orthostatic.  GI is planning for an EGD and colonoscopy with possible video capsule endoscopy and they were done.  EGD was normal and colonoscopy was a very poor prep.  She underwent a VCE and the study was incomplete and there is unclear if the capsule reached which the cecum given that there is  digested material in the entire small bowel.  The study was suboptimal but there was no visible blood in the small bowel.  GI recommended resuming anticoagulation at this time with outpatient GI follow-up and repeating VCE and following up with hematology for IV iron infusion referrals.  Repeat orthostatic vital signs but she appears to be medically stable.  Currently disposition is pending pending TOC and SNF Authorization.   12/4.  Patient feeling okay.  Offers no complaints.  Patient is orthostatic but does not drop down enough to cause symptoms. 12/5.  Patient feels okay.  TOC trying to contact family to see if they can sign her into a rehab. 12/6.  As per [facility] brother agreeable to sign her into rehab. 12/7.  Patient having headache.     Assessment and Plan: * Orthostatic hypotension Continue TED hose and eating and drinking.  Likely the cause of her syncope.  Hypokalemia Replaced  Acute on chronic anemia Last hemoglobin 8.7.  Received 1 unit of packed red blood cells during hospital course.  COPD (chronic obstructive pulmonary disease) (HCC) Respiratory status stable.  Sputum culture colonization as per ID.  HTN (hypertension) Holding antihypertensive medications secondary to orthostatic hypotension.  CKD stage 3a, GFR 45-59 ml/min (HCC) Creatinine 1.11 with a GFR of 50  PAF (paroxysmal atrial fibrillation) (HCC) Patient back on Eliquis  Normocytic anemia Last hemoglobin 8.7  Type II diabetes mellitus with renal manifestations (HCC) Last hemoglobin A1c  low at 5.9  Infestation by bed bug On admission  Obesity (BMI 30-39.9) BMI 33.27 with current height and weight in computer.  Headache Tylenol.  1 dose of IV magnesium.  Vitamin D deficiency On vitamin D supplementation.  Iron deficiency anemia due to chronic blood loss Small bowel capsule study unclear if reached the cecum.  No bleeding seen on what they did see.  Colonoscopy poor prep did not see any  bleeding.  EGD negative.  Vitamin B12 deficiency Currently on oral B12 supplementation        Subjective: Patient complains of a headache.  Otherwise feels okay.  Ate a good breakfast.  Physical Exam: Vitals:   08/29/23 0724 08/29/23 1634 08/29/23 2015 08/30/23 0816  BP: (!) 113/54 112/64 (!) 140/55 111/62  Pulse: (!) 59 65 76 65  Resp: 17 17 18 18   Temp: 97.6 F (36.4 C) 97.6 F (36.4 C) 97.6 F (36.4 C) (!) 97.3 F (36.3 C)  TempSrc:  Oral    SpO2: 94% 96% 95% 96%  Weight:      Height:       Physical Exam HENT:     Head: Normocephalic.     Mouth/Throat:     Pharynx: No oropharyngeal exudate.  Eyes:     General: Lids are normal.     Conjunctiva/sclera: Conjunctivae normal.  Cardiovascular:     Rate and Rhythm: Normal rate and regular rhythm.     Heart sounds: Normal heart sounds, S1 normal and S2 normal.  Pulmonary:     Breath sounds: No decreased breath sounds, wheezing, rhonchi or rales.  Abdominal:     Palpations: Abdomen is soft.     Tenderness: There is no abdominal tenderness.  Musculoskeletal:     Right lower leg: No swelling.     Left lower leg: No swelling.  Skin:    General: Skin is warm.     Findings: No rash.  Neurological:     Mental Status: She is alert.     Data Reviewed: Hemoglobin 8.7, creatinine 1.11   Disposition: Status is: Inpatient Remains inpatient appropriate because: Will need insurance authorization for rehab  Planned Discharge Destination: Home    Time spent: 27 minutes  Author: Alford Highland, MD 08/30/2023 12:31 PM  For on call review [email or web address].

## 2023-08-30 NOTE — TOC Progression Note (Signed)
Transition of Care [facility]) - Progression Note    Patient Details  Name: Tracy Dickerson MRN: [id number] Date of Birth: 1941-01-21  Transition of [facility]) CM/SW Contact  Susa Simmonds, [place] Phone Number: 08/30/2023, 8:36 AM  Clinical Narrative:   Patients insurance authorization is still pending at this time.    Expected Discharge Plan: Skilled Nursing Facility Barriers to Discharge: Continued Medical Work up  Expected Discharge Plan and Services       Living arrangements for the past 2 months: Single Family Home                                       Social Determinants of Health (SDOH) Interventions SDOH Screenings   Food Insecurity: No Food Insecurity (08/21/2023)  Housing: Medium Risk (08/21/2023)  Transportation Needs: No Transportation Needs (08/21/2023)  Utilities: Not At Risk (08/21/2023)  Financial Resource Strain: Low Risk  (02/04/2023)   Received from [facility], [facility]  Physical Activity: Inactive (07/16/2021)   Received from [facility], [facility]  Stress: No Stress Concern Present (07/16/2021)   Received from [facility], [facility]  Tobacco Use: Medium Risk (08/23/2023)  Health Literacy: Medium Risk (07/16/2021)   Received from [facility], [facility]    Readmission Risk Interventions     No data to display

## 2023-08-31 DIAGNOSIS — I951 Orthostatic hypotension: Secondary | ICD-10-CM | POA: Diagnosis not present

## 2023-08-31 DIAGNOSIS — J438 Other emphysema: Secondary | ICD-10-CM | POA: Diagnosis not present

## 2023-08-31 DIAGNOSIS — D649 Anemia, unspecified: Secondary | ICD-10-CM | POA: Diagnosis not present

## 2023-08-31 DIAGNOSIS — E876 Hypokalemia: Secondary | ICD-10-CM | POA: Diagnosis not present

## 2023-08-31 MED ORDER — MIDODRINE HCL 5 MG PO TABS
5.0000 mg | ORAL_TABLET | Freq: Three times a day (TID) | ORAL | Status: DC
  Administered 2023-08-31 – 2023-09-03 (×10): 5 mg via ORAL
  Filled 2023-08-31 (×10): qty 1

## 2023-08-31 NOTE — TOC Progression Note (Signed)
Transition of Care [facility]) - Progression Note    Patient Details  Name: KAMILIA EBERSOL MRN: [id number] Date of Birth: 19-Mar-1941  Transition of Care [facility]) CM/SW Contact  Susa Simmonds, [place] Phone Number: 08/31/2023, 9:22 AM  Clinical Narrative:   CSW check the navi portal and patients insurance authorization is still pending    Expected Discharge Plan: Skilled Nursing Facility Barriers to Discharge: Continued Medical Work up  Expected Discharge Plan and Services       Living arrangements for the past 2 months: Single Family Home                                       Social Determinants of Health (SDOH) Interventions SDOH Screenings   Food Insecurity: No Food Insecurity (08/21/2023)  Housing: Medium Risk (08/21/2023)  Transportation Needs: No Transportation Needs (08/21/2023)  Utilities: Not At Risk (08/21/2023)  Financial Resource Strain: Low Risk  (02/04/2023)   Received from [facility], [facility]  Physical Activity: Inactive (07/16/2021)   Received from [facility], [facility]  Stress: No Stress Concern Present (07/16/2021)   Received from [facility], [facility]  Tobacco Use: Medium Risk (08/23/2023)  Health Literacy: Medium Risk (07/16/2021)   Received from [facility], [facility]    Readmission Risk Interventions     No data to display

## 2023-08-31 NOTE — Progress Notes (Addendum)
Progress Note   Patient: Tracy Dickerson QIO:[id number] DOB: Feb 03, 1941 DOA: 08/20/2023     6 DOS: the patient was seen and examined on 08/31/2023   Brief hospital course: HPI per Dr. Lorretta Harp on 08/20/23  Tracy Dickerson is a 82 y.o. female with medical history significant of hypertension, hyperlipidemia, diabetes mellitus, GERD, breast cancer, CKD-3a, septic shock due to UTI, PAF on Eliquis, who presents with generalized weakness, syncope, SOB and cough.   Pt states that over the last 2 weeks, she has had progressive worsening generalized weakness. She has dizziness and lightheadedness. She passed out twice today.  No unilateral numbness or tinglings in extremities.  No facial droop or slurred speech.  She has dry cough and shortness of breath.  She had some chest pain in the morning, which has resolved.  No fever or chills.  No nausea, vomiting, diarrhea or abdominal pain.  No symptoms of UTI.  Patient states that she ran out of her medications for more than 2 weeks.  She did not take her medications in the past 2 weeks, including Eliquis.  Per ED RN, pt was found to bed bugs.    Data reviewed independently and ED Course: pt was found to have WBC 7.7, trop 12, D-dimer 1.85, BNP 233, stable renal function, potassium 3.1, negative COVID PCR, temperature normal, blood pressure 110/59, heart rate 68, RR 20, oxygen saturation 95% on room air.  CT of the head and neck is negative for acute injury.  Patient is placed on telemetry bed for observation.    **Interim History Since Eliquis has been held given her drop in hemoglobin.  She was typed and screened and transfused 1 unit PRBCs.  Continues to be significant orthostatic.  GI is planning for an EGD and colonoscopy with possible video capsule endoscopy and they were done.  EGD was normal and colonoscopy was a very poor prep.  She underwent a VCE and the study was incomplete and there is unclear if the capsule reached which the cecum given that there is  digested material in the entire small bowel.  The study was suboptimal but there was no visible blood in the small bowel.  GI recommended resuming anticoagulation at this time with outpatient GI follow-up and repeating VCE and following up with hematology for IV iron infusion referrals.  Repeat orthostatic vital signs but she appears to be medically stable.  Currently disposition is pending pending TOC and SNF Authorization.   12/4.  Patient feeling okay.  Offers no complaints.  Patient is orthostatic but does not drop down enough to cause symptoms. 12/5.  Patient feels okay.  TOC trying to contact family to see if they can sign her into a rehab. 12/6.  As per [facility] brother agreeable to sign her into rehab. 12/7.  Patient having headache. 12/8.  Patient likes that here in the hospital and would like to stay here.     Assessment and Plan: * Orthostatic hypotension Continue TED hose and eating and drinking.  Likely the cause of her syncope.  Likely deconditioning playing a role.  Start midodrine send still orthostatic today.  Hypokalemia Replaced  Acute on chronic anemia Last hemoglobin 8.7.  Received 1 unit of packed red blood cells during hospital course.  COPD (chronic obstructive pulmonary disease) (HCC) Respiratory status stable.  Sputum culture colonization as per ID.  HTN (hypertension) Holding antihypertensive medications secondary to orthostatic hypotension.  CKD stage 3a, GFR 45-59 ml/min (HCC) Creatinine 1.11 with a GFR of 50  PAF (paroxysmal atrial fibrillation) (HCC) Patient back on Eliquis  Normocytic anemia Last hemoglobin 8.7  Type II diabetes mellitus with renal manifestations (HCC) Last hemoglobin A1c low at 5.9  Infestation by bed bug On admission  Obesity (BMI 30-39.9) BMI 33.27 with current height and weight in computer.  Headache Tylenol.  1 dose of IV magnesium.  Vitamin D deficiency On vitamin D supplementation.  Iron deficiency anemia due to  chronic blood loss Small bowel capsule study unclear if reached the cecum.  No bleeding seen on what they did see.  Colonoscopy poor prep did not see any bleeding.  EGD negative.  Vitamin B12 deficiency Currently on oral B12 supplementation        Subjective: Patient did drop her blood pressure with orthostatics today.  Will start midodrine.  Admitted 10 days ago with syncope.  Physical Exam: Vitals:   08/31/23 0728 08/31/23 0939 08/31/23 0941 08/31/23 0945  BP: (!) 159/67 124/60 (!) 128/55 (!) 84/74  Pulse: 79 78 74 96  Resp: 18 18 18 18   Temp: (!) 97.5 F (36.4 C) 98.4 F (36.9 C) 98.4 F (36.9 C) 98.4 F (36.9 C)  TempSrc:      SpO2: 100% 96% 97% 98%  Weight:      Height:       Physical Exam HENT:     Head: Normocephalic.     Mouth/Throat:     Pharynx: No oropharyngeal exudate.  Eyes:     General: Lids are normal.     Conjunctiva/sclera: Conjunctivae normal.  Cardiovascular:     Rate and Rhythm: Normal rate and regular rhythm.     Heart sounds: Normal heart sounds, S1 normal and S2 normal.  Pulmonary:     Breath sounds: No decreased breath sounds, wheezing, rhonchi or rales.  Abdominal:     Palpations: Abdomen is soft.     Tenderness: There is no abdominal tenderness.  Musculoskeletal:     Right lower leg: Swelling present.     Left lower leg: Swelling present.  Skin:    General: Skin is warm.     Findings: No rash.  Neurological:     Mental Status: She is alert.     Data Reviewed: Last hemoglobin 8.7  Disposition: Status is: Inpatient Remains inpatient appropriate because: Awaiting insurance authorization for rehab  Planned Discharge Destination: Rehab    Time spent: 28 minutes  Author: Alford Highland, MD 08/31/2023 1:08 PM  For on call review [email or web address].

## 2023-09-01 DIAGNOSIS — D649 Anemia, unspecified: Secondary | ICD-10-CM | POA: Diagnosis not present

## 2023-09-01 DIAGNOSIS — I951 Orthostatic hypotension: Secondary | ICD-10-CM | POA: Diagnosis not present

## 2023-09-01 DIAGNOSIS — J439 Emphysema, unspecified: Secondary | ICD-10-CM | POA: Diagnosis not present

## 2023-09-01 DIAGNOSIS — I10 Essential (primary) hypertension: Secondary | ICD-10-CM | POA: Diagnosis not present

## 2023-09-01 LAB — CBC
HCT: 27 % — ABNORMAL LOW (ref 36.0–46.0)
Hemoglobin: 9.2 g/dL — ABNORMAL LOW (ref 12.0–15.0)
MCH: 28.8 pg (ref 26.0–34.0)
MCHC: 34.1 g/dL (ref 30.0–36.0)
MCV: 84.6 fL (ref 80.0–100.0)
Platelets: 194 10*3/uL (ref 150–400)
RBC: 3.19 MIL/uL — ABNORMAL LOW (ref 3.87–5.11)
RDW: 19.6 % — ABNORMAL HIGH (ref 11.5–15.5)
WBC: 7.6 10*3/uL (ref 4.0–10.5)
nRBC: 0 % (ref 0.0–0.2)

## 2023-09-01 MED ORDER — IRON SUCROSE 300 MG IVPB - SIMPLE MED
300.0000 mg | Freq: Once | Status: AC
  Administered 2023-09-01: 300 mg via INTRAVENOUS
  Filled 2023-09-01: qty 300

## 2023-09-01 NOTE — Progress Notes (Signed)
Progress Note   Patient: Tracy Dickerson [id number]:[id number] DOB: [birth year] DOA: 08/20/2023     7 DOS: the patient was seen and examined on 09/01/2023   Brief hospital course: HPI per Dr. Lorretta Harp on 08/20/23  Tracy Dickerson is a 82 y.o. female with medical history significant of hypertension, hyperlipidemia, diabetes mellitus, GERD, breast cancer, CKD-3a, septic shock due to UTI, PAF on Eliquis, who presents with generalized weakness, syncope, SOB and cough.   Pt states that over the last 2 weeks, she has had progressive worsening generalized weakness. She has dizziness and lightheadedness. She passed out twice today.  No unilateral numbness or tinglings in extremities.  No facial droop or slurred speech.  She has dry cough and shortness of breath.  She had some chest pain in the morning, which has resolved.  No fever or chills.  No nausea, vomiting, diarrhea or abdominal pain.  No symptoms of UTI.  Patient states that she ran out of her medications for more than 2 weeks.  She did not take her medications in the past 2 weeks, including Eliquis.  Per ED RN, pt was found to bed bugs.    Data reviewed independently and ED Course: pt was found to have WBC 7.7, trop 12, D-dimer 1.85, BNP 233, stable renal function, potassium 3.1, negative COVID PCR, temperature normal, blood pressure 110/59, heart rate 68, RR 20, oxygen saturation 95% on room air.  CT of the head and neck is negative for acute injury.  Patient is placed on telemetry bed for observation.    **Interim History Since Eliquis has been held given her drop in hemoglobin.  She was typed and screened and transfused 1 unit PRBCs.  Continues to be significant orthostatic.  GI is planning for an EGD and colonoscopy with possible video capsule endoscopy and they were done.  EGD was normal and colonoscopy was a very poor prep.  She underwent a VCE and the study was incomplete and there is unclear if the capsule reached which the cecum given that there is  digested material in the entire small bowel.  The study was suboptimal but there was no visible blood in the small bowel.  GI recommended resuming anticoagulation at this time with outpatient GI follow-up and repeating VCE and following up with hematology for IV iron infusion referrals.  Repeat orthostatic vital signs but she appears to be medically stable.  Currently disposition is pending pending TOC and SNF Authorization.   12/4.  Patient feeling okay.  Offers no complaints.  Patient is orthostatic but does not drop down enough to cause symptoms. 12/5.  Patient feels okay.  TOC trying to contact family to see if they can sign her into a rehab. 12/6.  As per [facility] brother agreeable to sign her into rehab. 12/7.  Patient having headache. 12/8.  Patient likes that here in the hospital and would like to stay here.  Started midodrine for orthostatic hypotension. 12/9.  Awaiting insurance authorization for rehab.      Assessment and Plan: * Orthostatic hypotension Continue TED hose and eating and drinking.  Likely the cause of her syncope.  Likely deconditioning playing a role.  Continue midodrine.  Acute on chronic anemia Last hemoglobin 9.2.  Received 1 unit of packed red blood cells during hospital course.  Will give a dose of IV iron.  COPD (chronic obstructive pulmonary disease) (HCC) Respiratory status stable.  Sputum culture colonization as per ID.  HTN (hypertension) Holding antihypertensive medications secondary to orthostatic hypotension.  CKD stage 3a, GFR 45-59 ml/min (HCC) Creatinine 1.11 with a GFR of 50  PAF (paroxysmal atrial fibrillation) (HCC) Patient back on Eliquis  Normocytic anemia Last hemoglobin 9.2  Type II diabetes mellitus with renal manifestations (HCC) Last hemoglobin A1c low at 5.9  Hypokalemia Replaced  Infestation by bed bug On admission  Obesity (BMI 30-39.9) BMI 33.27 with current height and weight in  computer.  Headache Tylenol.  Vitamin D deficiency On vitamin D supplementation.  Iron deficiency anemia due to chronic blood loss Small bowel capsule study unclear if reached the cecum.  No bleeding seen on what they did see.  Colonoscopy poor prep did not see any bleeding.  EGD negative.  Since we did not get insurance authorization today I will give IV iron.  Vitamin B12 deficiency Currently on oral B12 supplementation        Subjective: Patient feels okay.  Offers no complaints.  Admitted initially 11 days ago with syncope.  Physical Exam: Vitals:   08/31/23 1511 08/31/23 2045 09/01/23 0408 09/01/23 0729  BP: (!) 127/57 (!) 134/58 (!) 131/58 (!) 116/55  Pulse: 72 63 84 68  Resp: 20 19 16 19   Temp: 98.6 F (37 C) (!) 97.5 F (36.4 C) 97.7 F (36.5 C) 97.6 F (36.4 C)  TempSrc: Oral Oral Oral   SpO2: 97% 96% 98% 95%  Weight:      Height:       Physical Exam HENT:     Head: Normocephalic.     Mouth/Throat:     Pharynx: No oropharyngeal exudate.  Eyes:     General: Lids are normal.     Conjunctiva/sclera: Conjunctivae normal.  Cardiovascular:     Rate and Rhythm: Normal rate and regular rhythm.     Heart sounds: S1 normal and S2 normal. Murmur heard.     Systolic murmur is present with a grade of 2/6.  Pulmonary:     Breath sounds: No decreased breath sounds, wheezing, rhonchi or rales.  Abdominal:     Palpations: Abdomen is soft.     Tenderness: There is no abdominal tenderness.  Musculoskeletal:     Right lower leg: Swelling present.     Left lower leg: Swelling present.  Skin:    General: Skin is warm.     Findings: No rash.  Neurological:     Mental Status: She is alert.     Data Reviewed: Hemoglobin 9.2, creatinine 1.1    Disposition: Status is: Inpatient Remains inpatient appropriate because: Still awaiting insurance authorization for rehab.  Since we did not receive insurance authorization will give another dose of IV iron.  Planned  Discharge Destination: Rehab    Time spent: 28 minutes  Author: Alford Highland, MD 09/01/2023 3:06 PM  For on call review [email or web address].

## 2023-09-01 NOTE — TOC Progression Note (Signed)
Transition of Care [facility]) - Progression Note    Patient Details  Name: Tracy Dickerson MRN: [id number] Date of Birth: 28-Apr-1941  [facility]) CM/SW Contact  Garret Reddish, RN Phone Number: 09/01/2023, 5:01 PM  Clinical Narrative:    Chart reviewed.  SNF authorization continues to be pending.  Dr. Renae Gloss make aware.  TOC will continue to follow for discharge planning.      Expected Discharge Plan: Skilled Nursing Facility Barriers to Discharge: Continued Medical Work up  Expected Discharge Plan and Services       Living arrangements for the past 2 months: Single Family Home                                       Social Determinants of Health (SDOH) Interventions SDOH Screenings   Food Insecurity: No Food Insecurity (08/21/2023)  Housing: Medium Risk (08/21/2023)  Transportation Needs: No Transportation Needs (08/21/2023)  Utilities: Not At Risk (08/21/2023)  Financial Resource Strain: Low Risk  (02/04/2023)   Received from [facility], [facility]  Physical Activity: Inactive (07/16/2021)   Received from [facility], [facility]  Stress: No Stress Concern Present (07/16/2021)   Received from [facility], [facility]  Tobacco Use: Medium Risk (08/23/2023)  Health Literacy: Medium Risk (07/16/2021)   Received from [facility], [facility]    Readmission Risk Interventions     No data to display

## 2023-09-01 NOTE — Progress Notes (Signed)
Physical Therapy Treatment Patient Details Name: Tracy Dickerson MRN: [id number] DOB: 09-11-1941 Today's Date: 09/01/2023   History of Present Illness Pt is an 82 y.o. female presenting to hospital 08/20/23 with c/o generalized weakness and fall.  Pt found to have bed bugs.  Pt admitted with syncope, hypokalemia, and sympomatic anemia.  PMH includes htn, HLD, DM, breast CA, L breast surgery, L tib/fib ORIF, CKD, septic shock d/t UTI, PAF.    PT Comments  Patient supine in bed upon PT arrival, agreeable to tx session this date. Patient continues to be Mod I with bed mobility with utilized of bed rails. CGA for transfers and short distance ambulation with use of RW. Intermittent verbal cues for hand placement and use of RW. Continue to refuse to trial ambulation with less restrictive AD or no AD due to fear of falling. Patient able to ambulate 15 ft with CGA, limited distance as patient requesting to return back to bed. Vitals stable throughout session, Patient did endorse some lightheadedness with transition from supine > sit. Patient will continue to benefit from skilled acute PT services to maximize functional mobility. Will continue to follow acutely.    If plan is discharge home, recommend the following: A little help with walking and/or transfers;A little help with bathing/dressing/bathroom;Assistance with cooking/housework;Assist for transportation;Help with stairs or ramp for entrance   Can travel by private vehicle        Equipment Recommendations  Rolling walker (2 wheels);BSC/3in1    Recommendations for Other Services       Precautions / Restrictions Precautions Precautions: Fall Restrictions Weight Bearing Restrictions: No     Mobility  Bed Mobility Overal bed mobility: Modified Independent Bed Mobility: Supine to Sit, Sit to Supine     Supine to sit: Modified independent (Device/Increase time) Sit to supine: Modified independent (Device/Increase time)   General bed  mobility comments: Patient require use of bed rails; increased time. Reports mild lightheadedness. BP supine: 115/55, seated: 124/68.    Transfers Overall transfer level: Needs assistance Equipment used: Rolling walker (2 wheels) Transfers: Sit to/from Stand Sit to Stand: Contact guard assist, Min assist           General transfer comment: CGA for STS from EOB; cues to scoot forward and hand placement.    Ambulation/Gait Ambulation/Gait assistance: Contact guard assist Gait Distance (Feet): 15 Feet Assistive device: Rolling walker (2 wheels) Gait Pattern/deviations: Step-through pattern, Decreased step length - right, Decreased step length - left Gait velocity: Decreased     General Gait Details: CGA for safety. Able to use RW safely this date; distance limited due to patient requesting to return to bed as has company visiting.   Stairs             Wheelchair Mobility     Tilt Bed    Modified Rankin (Stroke Patients Only)       Balance Overall balance assessment: Needs assistance Sitting-balance support: No upper extremity supported, Feet supported Sitting balance-Leahy Scale: Good     Standing balance support: Bilateral upper extremity supported, During functional activity, Reliant on assistive device for balance Standing balance-Leahy Scale: Good                              Cognition Arousal: Alert Behavior During Therapy: WFL for tasks assessed/performed Overall Cognitive Status: No family/caregiver present to determine baseline cognitive functioning  Exercises      General Comments General comments (skin integrity, edema, etc.): Vitals stable throughout session despite lightheadedness reported intermittently.      Pertinent Vitals/Pain Pain Assessment Pain Assessment: No/denies pain    Home Living                          Prior Function            PT  Goals (current goals can now be found in the care plan section) Acute Rehab PT Goals PT Goal Formulation: With patient Time For Goal Achievement: 09/05/23 Potential to Achieve Goals: Good Progress towards PT goals: Progressing toward goals    Frequency    Min 1X/week      PT Plan      Co-evaluation              AM-PAC PT "6 Clicks" Mobility   Outcome Measure  Help needed turning from your back to your side while in a flat bed without using bedrails?: None Help needed moving from lying on your back to sitting on the side of a flat bed without using bedrails?: None Help needed moving to and from a bed to a chair (including a wheelchair)?: A Little Help needed standing up from a chair using your arms (e.g., wheelchair or bedside chair)?: A Little Help needed to walk in hospital room?: A Little Help needed climbing 3-5 steps with a railing? : A Lot 6 Click Score: 19    End of Session Equipment Utilized During Treatment: Gait belt Activity Tolerance: Patient tolerated treatment well Patient left: in bed;with call bell/phone within reach;with bed alarm set Nurse Communication: Mobility status PT Visit Diagnosis: Unsteadiness on feet (R26.81);Other abnormalities of gait and mobility (R26.89);Muscle weakness (generalized) (M62.81);History of falling (Z91.81)     Time: 0944-1000 PT Time Calculation (min) (ACUTE ONLY): 16 min  Charges:    $Gait Training: 8-22 mins PT General Charges $$ ACUTE PT VISIT: 1 Visit                     Creed Copper Fairly, PT, DPT 09/01/23 10:06 AM

## 2023-09-02 DIAGNOSIS — D649 Anemia, unspecified: Secondary | ICD-10-CM | POA: Diagnosis not present

## 2023-09-02 DIAGNOSIS — R7989 Other specified abnormal findings of blood chemistry: Secondary | ICD-10-CM | POA: Diagnosis not present

## 2023-09-02 DIAGNOSIS — I951 Orthostatic hypotension: Secondary | ICD-10-CM | POA: Diagnosis not present

## 2023-09-02 DIAGNOSIS — J439 Emphysema, unspecified: Secondary | ICD-10-CM | POA: Diagnosis not present

## 2023-09-02 LAB — BASIC METABOLIC PANEL
Anion gap: 6 (ref 5–15)
BUN: 26 mg/dL — ABNORMAL HIGH (ref 8–23)
CO2: 27 mmol/L (ref 22–32)
Calcium: 8.8 mg/dL — ABNORMAL LOW (ref 8.9–10.3)
Chloride: 108 mmol/L (ref 98–111)
Creatinine, Ser: 1.03 mg/dL — ABNORMAL HIGH (ref 0.44–1.00)
GFR, Estimated: 55 mL/min — ABNORMAL LOW (ref >60)
Glucose, Bld: 81 mg/dL (ref 70–99)
Potassium: 3.7 mmol/L (ref 3.5–5.1)
Sodium: 141 mmol/L (ref 135–145)

## 2023-09-02 LAB — HEMOGLOBIN: Hemoglobin: 8.9 g/dL — ABNORMAL LOW (ref 12.0–15.0)

## 2023-09-02 LAB — CORTISOL-AM, BLOOD: Cortisol - AM: 4.9 ug/dL — ABNORMAL LOW (ref 6.7–22.6)

## 2023-09-02 MED ORDER — COSYNTROPIN 0.25 MG IJ SOLR
0.2500 mg | Freq: Once | INTRAMUSCULAR | Status: AC
  Administered 2023-09-03: 0.25 mg via INTRAVENOUS
  Filled 2023-09-02: qty 0.25

## 2023-09-02 NOTE — Progress Notes (Signed)
Progress Note   Patient: Tracy Dickerson:[id number] DOB: July 11, 1941 DOA: 08/20/2023     8 DOS: the patient was seen and examined on 09/02/2023   Brief hospital course: HPI per Dr. Lorretta Harp on 08/20/23  Tracy Dickerson is a 82 y.o. female with medical history significant of hypertension, hyperlipidemia, diabetes mellitus, GERD, breast cancer, CKD-3a, septic shock due to UTI, PAF on Eliquis, who presents with generalized weakness, syncope, SOB and cough.   Pt states that over the last 2 weeks, she has had progressive worsening generalized weakness. She has dizziness and lightheadedness. She passed out twice today.  No unilateral numbness or tinglings in extremities.  No facial droop or slurred speech.  She has dry cough and shortness of breath.  She had some chest pain in the morning, which has resolved.  No fever or chills.  No nausea, vomiting, diarrhea or abdominal pain.  No symptoms of UTI.  Patient states that she ran out of her medications for more than 2 weeks.  She did not take her medications in the past 2 weeks, including Eliquis.  Per ED RN, pt was found to bed bugs.    Data reviewed independently and ED Course: pt was found to have WBC 7.7, trop 12, D-dimer 1.85, BNP 233, stable renal function, potassium 3.1, negative COVID PCR, temperature normal, blood pressure 110/59, heart rate 68, RR 20, oxygen saturation 95% on room air.  CT of the head and neck is negative for acute injury.  Patient is placed on telemetry bed for observation.    **Interim History Since Eliquis has been held given her drop in hemoglobin.  She was typed and screened and transfused 1 unit PRBCs.  Continues to be significant orthostatic.  GI is planning for an EGD and colonoscopy with possible video capsule endoscopy and they were done.  EGD was normal and colonoscopy was a very poor prep.  She underwent a VCE and the study was incomplete and there is unclear if the capsule reached which the cecum given that there is  digested material in the entire small bowel.  The study was suboptimal but there was no visible blood in the small bowel.  GI recommended resuming anticoagulation at this time with outpatient GI follow-up and repeating VCE and following up with hematology for IV iron infusion referrals.  Repeat orthostatic vital signs but she appears to be medically stable.  Currently disposition is pending pending TOC and SNF Authorization.   12/4.  Patient feeling okay.  Offers no complaints.  Patient is orthostatic but does not drop down enough to cause symptoms. 12/5.  Patient feels okay.  TOC trying to contact family to see if they can sign her into a rehab. 12/6.  As per [facility] brother agreeable to sign her into rehab. 12/7.  Patient having headache. 12/8.  Patient likes that here in the hospital and would like to stay here.  Started midodrine for orthostatic hypotension. 12/9.  Awaiting insurance authorization for rehab. 12/10.  Patient's cortisol level came back borderline at 4.9.  Cosyntropin test for tomorrow morning.  Received insurance authorization for rehab.      Assessment and Plan: * Orthostatic hypotension Likely the cause of her syncope.  Continue TED hose.  Likely deconditioning playing a role.  Continue midodrine.  A.m. cortisol came back low at 4.9.  Patient has not been on any steroids.  Will check a cosyntropin test tomorrow to see if patient has adrenal insufficiency.  Acute on chronic anemia Last hemoglobin  8.9.  Received 1 unit of packed red blood cells during hospital course and also IV iron.  COPD (chronic obstructive pulmonary disease) (HCC) Respiratory status stable.  Sputum culture colonization as per ID.  HTN (hypertension) Holding antihypertensive medications secondary to orthostatic hypotension.  CKD stage 3a, GFR 45-59 ml/min (HCC) Creatinine 1.03 with a GFR of 55  PAF (paroxysmal atrial fibrillation) (HCC) Patient back on Eliquis  Normocytic anemia Last hemoglobin  8.9  Type II diabetes mellitus with renal manifestations (HCC) Last hemoglobin A1c low at 5.9  Hypokalemia Replaced  Infestation by bed bug On admission  Obesity (BMI 30-39.9) BMI 33.27 with current height and weight in computer.  Headache Tylenol.  Vitamin D deficiency On vitamin D supplementation.  Iron deficiency anemia due to chronic blood loss Small bowel capsule study unclear if reached the cecum.  No bleeding seen on what they did see.  Colonoscopy poor prep did not see any bleeding.  EGD negative.  IV iron given on 12/9.  Vitamin B12 deficiency Currently on oral B12 supplementation        Subjective: Patient feels okay.  Offers no complaints.  Admitted with syncope and found to be orthostatic.  Physical Exam: Vitals:   09/01/23 1945 09/02/23 0428 09/02/23 0746 09/02/23 1515  BP: 121/67 134/64 (!) 135/58 (!) 140/58  Pulse: 67 68 65 69  Resp: 16 16  16   Temp: 98.4 F (36.9 C) 98.3 F (36.8 C) 98.6 F (37 C) 97.6 F (36.4 C)  TempSrc: Oral Oral  Oral  SpO2: 95% 94% 96% 97%  Weight:      Height:       Physical Exam HENT:     Head: Normocephalic.     Mouth/Throat:     Pharynx: No oropharyngeal exudate.  Eyes:     General: Lids are normal.     Conjunctiva/sclera: Conjunctivae normal.  Cardiovascular:     Rate and Rhythm: Normal rate and regular rhythm.     Heart sounds: S1 normal and S2 normal. Murmur heard.     Systolic murmur is present with a grade of 2/6.  Pulmonary:     Breath sounds: No decreased breath sounds, wheezing, rhonchi or rales.  Abdominal:     Palpations: Abdomen is soft.     Tenderness: There is no abdominal tenderness.  Musculoskeletal:     Right lower leg: Swelling present.     Left lower leg: Swelling present.  Skin:    General: Skin is warm.     Findings: No rash.  Neurological:     Mental Status: She is alert.     Data Reviewed: Creatinine 1.03, hemoglobin 8.9, a.m. cortisol 4.9  Family Communication: Spoke with  brother at the bedside  Disposition: Status is: Inpatient Remains inpatient appropriate because: Will check cosyntropin test tomorrow morning to test for adrenal insufficiency.  Received insurance authorization this afternoon for rehab.  Planned Discharge Destination: Rehab    Time spent: 32 minutes  Author: Alford Highland, MD 09/02/2023 6:06 PM  For on call review [email or web address].

## 2023-09-02 NOTE — TOC Progression Note (Signed)
Transition of Care [facility]) - Progression Note    Patient Details  Name: Tracy Dickerson MRN: [id number] Date of Birth: 06-02-1941  [facility]) CM/SW Contact  Garret Reddish, RN Phone Number: 09/02/2023, 4:40 PM  Clinical Narrative:    Chart reviewed.  SNF authorization received.  Plan auth ID- [id number] Auth ID [id number]  Approved from 09-02-23- 09-04-23.  Next review date is 09/04/23.  I have spoken with Ricky with [facility] and he informs me that he does have a bed for the patient.  I have informed Clide Cliff that patient will be tentative discharge for tomorrow.    I have spoken with patient's brother [name] and made him aware of tentative discharge for tomorrow.  [name] reports that he will be coming to the hospital in the morning.    TOC will continue to follow progress of patient.     Expected Discharge Plan: Skilled Nursing Facility Barriers to Discharge: Continued Medical Work up  Expected Discharge Plan and Services       Living arrangements for the past 2 months: Single Family Home                                       Social Determinants of Health (SDOH) Interventions SDOH Screenings   Food Insecurity: No Food Insecurity (08/21/2023)  Housing: Medium Risk (08/21/2023)  Transportation Needs: No Transportation Needs (08/21/2023)  Utilities: Not At Risk (08/21/2023)  Financial Resource Strain: Low Risk  (02/04/2023)   Received from [facility], [facility]  Physical Activity: Inactive (07/16/2021)   Received from [facility], [facility]  Stress: No Stress Concern Present (07/16/2021)   Received from [facility], [facility]  Tobacco Use: Medium Risk (08/23/2023)  Health Literacy: Medium Risk (07/16/2021)   Received from [facility], [facility]    Readmission Risk Interventions     No data to display

## 2023-09-02 NOTE — Progress Notes (Signed)
PT Cancellation Note  Patient Details Name: Tracy Dickerson MRN: [id number] DOB: 1941/08/23   Cancelled Treatment:    Reason Eval/Treat Not Completed: Other (comment): Chart Reviewed. Patient asleep upon arrival, awakens to therapist voice. Patient refusing participation in PT services this date/time due to fatigue. Will re-attempt at later date.    Howie Ill, PT, DPT 09/02/23 1:49 PM

## 2023-09-02 NOTE — TOC Progression Note (Signed)
Transition of Care [facility]) - Progression Note    Patient Details  Name: MARIMAR KEASLER MRN: [id number] Date of Birth: 1941-06-03  [facility] CM/SW Contact  Garret Reddish, RN Phone Number: 09/02/2023, 11:18 AM  Clinical Narrative:    I have spoken with [facility].  Payor has requested updated PT notes.  I have upload updated PT notes to [facility] Portal.  TOC will continue to follow for discharge planning.     Expected Discharge Plan: Skilled Nursing Facility Barriers to Discharge: Continued Medical Work up  Expected Discharge Plan and Services       Living arrangements for the past 2 months: Single Family Home                                       Social Determinants of Health (SDOH) Interventions SDOH Screenings   Food Insecurity: No Food Insecurity (08/21/2023)  Housing: Medium Risk (08/21/2023)  Transportation Needs: No Transportation Needs (08/21/2023)  Utilities: Not At Risk (08/21/2023)  Financial Resource Strain: Low Risk  (02/04/2023)   Received from [facility], [facility]  Physical Activity: Inactive (07/16/2021)   Received from [facility], [facility]  Stress: No Stress Concern Present (07/16/2021)   Received from [facility], [facility]  Tobacco Use: Medium Risk (08/23/2023)  Health Literacy: Medium Risk (07/16/2021)   Received from [facility], [facility]    Readmission Risk Interventions     No data to display

## 2023-09-03 DIAGNOSIS — J189 Pneumonia, unspecified organism: Secondary | ICD-10-CM

## 2023-09-03 DIAGNOSIS — R531 Weakness: Secondary | ICD-10-CM

## 2023-09-03 LAB — BASIC METABOLIC PANEL
Anion gap: 7 (ref 5–15)
BUN: 27 mg/dL — ABNORMAL HIGH (ref 8–23)
CO2: 25 mmol/L (ref 22–32)
Calcium: 9 mg/dL (ref 8.9–10.3)
Chloride: 109 mmol/L (ref 98–111)
Creatinine, Ser: 1.09 mg/dL — ABNORMAL HIGH (ref 0.44–1.00)
GFR, Estimated: 51 mL/min — ABNORMAL LOW (ref >60)
Glucose, Bld: 89 mg/dL (ref 70–99)
Potassium: 3.9 mmol/L (ref 3.5–5.1)
Sodium: 141 mmol/L (ref 135–145)

## 2023-09-03 LAB — HEMOGLOBIN: Hemoglobin: 9.3 g/dL — ABNORMAL LOW (ref 12.0–15.0)

## 2023-09-03 LAB — ACTH STIMULATION, 3 TIME POINTS
Cortisol, 30 Min: 21.9 ug/dL
Cortisol, 60 Min: 27.1 ug/dL
Cortisol, Base: 5.7 ug/dL

## 2023-09-03 MED ORDER — VITAMIN D (ERGOCALCIFEROL) 1.25 MG (50000 UNIT) PO CAPS: 50000.0000 [IU] | ORAL_CAPSULE | ORAL | Status: DC

## 2023-09-03 MED ORDER — HYDROXYZINE HCL 10 MG PO TABS: 10.0000 mg | ORAL_TABLET | Freq: Three times a day (TID) | ORAL | Status: DC | PRN

## 2023-09-03 MED ORDER — HYDROCORTISONE 5 MG PO TABS
5.0000 mg | ORAL_TABLET | Freq: Every day | ORAL | Status: DC
  Administered 2023-09-03: 5 mg via ORAL
  Filled 2023-09-03: qty 1

## 2023-09-03 MED ORDER — HYDROCORTISONE 5 MG PO TABS: 5.0000 mg | ORAL_TABLET | Freq: Every day | ORAL | Status: DC

## 2023-09-03 MED ORDER — TRAMADOL HCL 50 MG PO TABS: 50.0000 mg | ORAL_TABLET | Freq: Two times a day (BID) | ORAL | 0 refills | Status: DC | PRN

## 2023-09-03 MED ORDER — BISACODYL 10 MG RE SUPP: 10.0000 mg | Freq: Every day | RECTAL | Status: DC | PRN

## 2023-09-03 MED ORDER — GABAPENTIN 300 MG PO CAPS: 600.0000 mg | ORAL_CAPSULE | Freq: Every day | ORAL | Status: DC

## 2023-09-03 MED ORDER — FOLIC ACID 1 MG PO TABS: 1.0000 mg | ORAL_TABLET | Freq: Every day | ORAL | Status: DC

## 2023-09-03 MED ORDER — MIDODRINE HCL 5 MG PO TABS: 5.0000 mg | ORAL_TABLET | Freq: Three times a day (TID) | ORAL | Status: DC

## 2023-09-03 NOTE — Plan of Care (Signed)
  Problem: Education: Goal: Knowledge of General Education information will improve Description: Including pain rating scale, medication(s)/side effects and non-pharmacologic comfort measures Outcome: Progressing   Problem: Health Behavior/Discharge Planning: Goal: Ability to manage health-related needs will improve Outcome: Progressing   Problem: Clinical Measurements: Goal: Ability to maintain clinical measurements within normal limits will improve Outcome: Progressing Goal: Will remain free from infection Outcome: Progressing Goal: Diagnostic test results will improve Outcome: Progressing Goal: Respiratory complications will improve Outcome: Progressing Goal: Cardiovascular complication will be avoided Outcome: Progressing   Problem: Activity: Goal: Risk for activity intolerance will decrease Outcome: Progressing   Problem: Nutrition: Goal: Adequate nutrition will be maintained Outcome: Progressing   Problem: Coping: Goal: Level of anxiety will decrease Outcome: Progressing   Problem: Elimination: Goal: Will not experience complications related to bowel motility Outcome: Progressing Goal: Will not experience complications related to urinary retention Outcome: Progressing   Problem: Pain Management: Goal: General experience of comfort will improve Outcome: Progressing   Problem: Safety: Goal: Ability to remain free from injury will improve Outcome: Progressing   Problem: Skin Integrity: Goal: Risk for impaired skin integrity will decrease Outcome: Progressing   Problem: Activity: Goal: Ability to tolerate increased activity will improve Outcome: Progressing Goal: Will verbalize the importance of balancing activity with adequate rest periods Outcome: Progressing   Problem: Respiratory: Goal: Ability to maintain a clear airway will improve Outcome: Progressing Goal: Levels of oxygenation will improve Outcome: Progressing Goal: Ability to maintain adequate  ventilation will improve Outcome: Progressing   Problem: Activity: Goal: Ability to tolerate increased activity will improve Outcome: Progressing

## 2023-09-03 NOTE — Care Management Important Message (Signed)
Important Message  Patient Details  Name: DEZMARIAH BELO MRN: [id number] Date of Birth: [birth year]   Important Message Given:  Yes - Medicare IM     Verita Schneiders Clovis Mankins 09/03/2023, 12:33 PM

## 2023-09-03 NOTE — Discharge Summary (Signed)
Physician Discharge Summary   Patient: Tracy Dickerson MRN: [id number] DOB: December 17, 1940  Admit date:     08/20/2023  Discharge date: 09/03/23  Discharge Physician: Arnetha Courser   PCP: Dwana Melena, PA   Recommendations at discharge:  Please obtain CBC and BMP on follow-up Patient with low cortisol level with appropriate response to cosyntropin, need to follow-up with endocrinologist for further recommendation, started on Cortef due to positive orthostatic signs with low cortisol level.  Does need to be titrated. Please follow-up on ACTH levels Follow-up with primary care provider and preferably with her hematologist for IV iron infusions. Follow-up with gastroenterology  Discharge Diagnoses: Principal Problem:   Orthostatic hypotension Active Problems:   Syncope   Acute on chronic anemia   HTN (hypertension)   COPD (chronic obstructive pulmonary disease) (HCC)   PAF (paroxysmal atrial fibrillation) (HCC)   CKD stage 3a, GFR 45-59 ml/min (HCC)   Normocytic anemia   Type II diabetes mellitus with renal manifestations (HCC)   Hypokalemia   Infestation by bed bug   Obesity (BMI 30-39.9)   Community acquired pneumonia   Vitamin B12 deficiency   Iron deficiency anemia due to chronic blood loss   Iron deficiency anemia   Dizziness   Vitamin D deficiency   Headache   Low serum cortisol level  Resolved Problems:   * No resolved hospital problems. [facility] Course: HPI per Dr. Lorretta Harp on 08/20/23  Tracy Dickerson is a 82 y.o. female with medical history significant of hypertension, hyperlipidemia, diabetes mellitus, GERD, breast cancer, CKD-3a, septic shock due to UTI, PAF on Eliquis, who presents with generalized weakness, syncope, SOB and cough.   Pt states that over the last 2 weeks, she has had progressive worsening generalized weakness. She has dizziness and lightheadedness. She passed out twice today.  No unilateral numbness or tinglings in extremities.  No facial droop  or slurred speech.  She has dry cough and shortness of breath.  She had some chest pain in the morning, which has resolved.  No fever or chills.  No nausea, vomiting, diarrhea or abdominal pain.  No symptoms of UTI.  Patient states that she ran out of her medications for more than 2 weeks.  She did not take her medications in the past 2 weeks, including Eliquis.  Per ED RN, pt was found to bed bugs.    Data reviewed independently and ED Course: pt was found to have WBC 7.7, trop 12, D-dimer 1.85, BNP 233, stable renal function, potassium 3.1, negative COVID PCR, temperature normal, blood pressure 110/59, heart rate 68, RR 20, oxygen saturation 95% on room air.  CT of the head and neck is negative for acute injury.  Patient is placed on telemetry bed for observation.    **Interim History Since Eliquis has been held given her drop in hemoglobin.  She was typed and screened and transfused 1 unit PRBCs.  Continues to be significant orthostatic.  GI is planning for an EGD and colonoscopy with possible video capsule endoscopy and they were done.  EGD was normal and colonoscopy was a very poor prep.  She underwent a VCE and the study was incomplete and there is unclear if the capsule reached which the cecum given that there is digested material in the entire small bowel.  The study was suboptimal but there was no visible blood in the small bowel.  GI recommended resuming anticoagulation at this time with outpatient GI follow-up and repeating VCE and following up  with hematology for IV iron infusion referrals.  Repeat orthostatic vital signs but she appears to be medically stable.  Currently disposition is pending pending TOC and SNF Authorization.   12/4.  Patient feeling okay.  Offers no complaints.  Patient is orthostatic but does not drop down enough to cause symptoms. 12/5.  Patient feels okay.  TOC trying to contact family to see if they can sign her into a rehab. 12/6.  As per [facility] brother agreeable to  sign her into rehab. 12/7.  Patient having headache. 12/8.  Patient likes that here in the hospital and would like to stay here.  Started midodrine for orthostatic hypotension. 12/9.  Awaiting insurance authorization for rehab. 12/10.  Patient's cortisol level came back borderline at 4.9.  Cosyntropin test for tomorrow morning.  Received insurance authorization for rehab. 12/11: Cosyntropin test with low baseline cortisol level with an appropriate response to cosyntropin stimulation.  ACTH levels are still pending.  Patient was started on Cortef at 5 mg daily.  She need to follow-up with endocrinology as outpatient for further management and dose titration.  She is also on midodrine and they can discontinue as blood pressure improves with Cortef.  She was provided with ambulatory referral to see an endocrinologist.  Patient remained otherwise stable.  She has to be slow with changing position especially from sitting to standing to decrease the risk of fall.  Patient also need IV iron infusion which can be arranged by her PCP. She might need a referral to hematology, which can vary by PCP.  Our physical therapist evaluated her and recommending rehab where she is being discharged for further management.  Patient will continue on current medications and need to have a close follow-up with her providers for further management.     Assessment and Plan: * Orthostatic hypotension Likely the cause of her syncope.  Continue TED hose.  Likely deconditioning playing a role.  Continue midodrine.  A.m. cortisol came back low at 4.9.  Patient has not been on any steroids.  Appropriate response during cosyntropin stimulation test. -Started on low-dose Cortef-dose can be titrated as outpatient -Patient need to see an endocrinologist for further evaluation and management  Acute on chronic anemia Last hemoglobin 9.3.  Received 1 unit of packed red blood cells during hospital course and also IV iron. She  might need continuation of IV iron infusion which need to be arranged by PCP.  COPD (chronic obstructive pulmonary disease) (HCC) Respiratory status stable.  Sputum culture colonization as per ID.  HTN (hypertension) Holding antihypertensive medications secondary to orthostatic hypotension.  CKD stage 3a, GFR 45-59 ml/min (HCC) Creatinine 1.03 with a GFR of 55  PAF (paroxysmal atrial fibrillation) (HCC) Patient back on Eliquis  Normocytic anemia Last hemoglobin 9.3  Type II diabetes mellitus with renal manifestations (HCC) Last hemoglobin A1c low at 5.9  Hypokalemia Replaced  Infestation by bed bug On admission  Obesity (BMI 30-39.9) BMI 33.27 with current height and weight in computer.  Headache Tylenol.  Vitamin D deficiency On vitamin D supplementation.  Iron deficiency anemia due to chronic blood loss Small bowel capsule study unclear if reached the cecum.  No bleeding seen on what they did see.  Colonoscopy poor prep did not see any bleeding.  EGD negative.  IV iron given on 12/9. Outpatient follow-up with GI  Vitamin B12 deficiency Currently on oral B12 supplementation   Consultants: Gastroenterology Procedures performed: EGD, colonoscopy, capsule endoscopy Disposition: Skilled nursing facility Diet recommendation:  Discharge Diet Orders (  From admission, onward)     Start     Ordered   09/03/23 0000  Diet - low sodium heart healthy        09/03/23 1103           Cardiac diet DISCHARGE MEDICATION: Allergies as of 09/03/2023       Reactions   Oxycodone    Percocet [oxycodone-acetaminophen] Itching   Penicillins Rash   Has patient had a PCN reaction causing immediate rash, facial/tongue/throat swelling, SOB or lightheadedness with hypotension: Yes Has patient had a PCN reaction causing severe rash involving mucus membranes or skin necrosis: Unknown Has patient had a PCN reaction that required hospitalization: Unknown Has patient had a PCN  reaction occurring within the last 10 years: No If all of the above answers are "NO", then may proceed with Cephalosporin use.        Medication List     STOP taking these medications    lisinopril 10 MG tablet Commonly known as: ZESTRIL       TAKE these medications    acetaminophen 500 MG tablet Commonly known as: TYLENOL Take 500 mg by mouth every 6 (six) hours as needed.   apixaban 5 MG Tabs tablet Commonly known as: ELIQUIS Take 1 tablet (5 mg total) by mouth 2 (two) times daily.   atorvastatin 20 MG tablet Commonly known as: LIPITOR Take 20 mg by mouth daily.   bisacodyl 10 MG suppository Commonly known as: DULCOLAX Place 1 suppository (10 mg total) rectally daily as needed for severe constipation.   cetirizine 5 MG tablet Commonly known as: ZYRTEC Take 1 tablet (5 mg total) by mouth daily.   cyanocobalamin 1000 MCG tablet Commonly known as: VITAMIN B12 Take 1 tablet (1,000 mcg total) by mouth daily.   fluticasone-salmeterol 250-50 MCG/ACT Aepb Commonly known as: ADVAIR Inhale 1 puff into the lungs in the morning and at bedtime.   folic acid 1 MG tablet Commonly known as: FOLVITE Take 1 tablet (1 mg total) by mouth daily. Start taking on: September 04, 2023   gabapentin 300 MG capsule Commonly known as: NEURONTIN Take 2 capsules (600 mg total) by mouth at bedtime. What changed:  how much to take additional instructions   hydrocortisone 5 MG tablet Commonly known as: CORTEF Take 1 tablet (5 mg total) by mouth daily.   hydrOXYzine 10 MG tablet Commonly known as: ATARAX Take 1 tablet (10 mg total) by mouth 3 (three) times daily as needed for itching.   meclizine 12.5 MG tablet Commonly known as: ANTIVERT Take 1 tablet (12.5 mg total) by mouth 3 (three) times daily as needed for dizziness.   midodrine 5 MG tablet Commonly known as: PROAMATINE Take 1 tablet (5 mg total) by mouth 3 (three) times daily with meals.   ondansetron 4 MG  disintegrating tablet Commonly known as: Zofran ODT Take 1 tablet (4 mg total) by mouth every 8 (eight) hours as needed.   pantoprazole 40 MG tablet Commonly known as: Protonix Take 1 tablet (40 mg total) by mouth 2 (two) times daily.   ProAir HFA 108 (90 Base) MCG/ACT inhaler Generic drug: albuterol Inhale 2 puffs into the lungs every 4 (four) hours as needed for wheezing or shortness of breath.   Spiriva HandiHaler 18 MCG inhalation capsule Generic drug: tiotropium Place 1 capsule (18 mcg total) into inhaler and inhale daily.   traMADol 50 MG tablet Commonly known as: ULTRAM Take 1 tablet (50 mg total) by mouth 2 (two) times daily as  needed.   triamcinolone ointment 0.1 % Commonly known as: KENALOG Apply 1 Application topically 2 (two) times daily.   Vitamin D (Ergocalciferol) 1.25 MG (50000 UNIT) Caps capsule Commonly known as: DRISDOL Take 1 capsule (50,000 Units total) by mouth every 7 (seven) days for 7 doses. Start taking on: September 05, 2023        Contact information for follow-up providers     Dwana Melena, [place]. Schedule an appointment as soon as possible for a visit in 1 week(s).   Specialty: Internal Medicine Contact information: [address] [place] [address] [phone]              Contact information for after-discharge care     Destination     [facility] .   Service: Skilled Nursing Contact information: [address] [facility] [place] [address] [phone]                    Discharge Exam: Filed Weights   08/20/23 1319  Weight: 90.7 kg   General.  Obese elderly lady, in no acute distress. Pulmonary.  Lungs clear bilaterally, normal respiratory effort. CV.  Regular rate and rhythm, no JVD, rub or murmur. Abdomen.  Soft, nontender, nondistended, BS positive. CNS.  Alert and oriented .  No focal neurologic deficit. Extremities.  No edema, no cyanosis, pulses intact  and symmetrical.   Condition at discharge: stable  The results of significant diagnostics from this hospitalization (including imaging, microbiology, ancillary and laboratory) are listed below for reference.   Imaging Studies: DG Chest Port 1 View  Result Date: 08/25/2023 CLINICAL DATA:  [id number] with shortness of breath. EXAM: PORTABLE CHEST 1 VIEW COMPARISON:  CTA chest 08/20/2023 FINDINGS: 4:07 a.m. There are low lung volumes, with subpleural fibrotic changes. No focal pneumonia is evident. There is no substantial pleural effusion. The cardiomediastinal silhouette is stable, with no evidence of CHF. There is aortic atherosclerosis. Multiple left axillary surgical clips. Thoracic spondylosis. IMPRESSION: Low lung volumes with subpleural fibrotic changes. No evidence of acute chest disease. Electronically Signed   By: Almira Bar M.D.   On: 08/25/2023 06:29   US Venous Img Lower Bilateral (DVT)  Result Date: 08/21/2023 CLINICAL DATA:  Positive D-dimer. EXAM: BILATERAL LOWER EXTREMITY VENOUS DOPPLER ULTRASOUND TECHNIQUE: Gray-scale sonography with graded compression, as well as color Doppler and duplex ultrasound were performed to evaluate the lower extremity deep venous systems from the level of the common femoral vein and including the common femoral, femoral, profunda femoral, popliteal and calf veins including the posterior tibial, peroneal and gastrocnemius veins when visible. The superficial great saphenous vein was also interrogated. Spectral Doppler was utilized to evaluate flow at rest and with distal augmentation maneuvers in the common femoral, femoral and popliteal veins. COMPARISON:  April 20, 2021 FINDINGS: RIGHT LOWER EXTREMITY Common Femoral Vein: No evidence of thrombus. Normal compressibility, respiratory phasicity and response to augmentation. Saphenofemoral Junction: No evidence of thrombus. Normal compressibility and flow on color Doppler imaging. Profunda Femoral Vein: No evidence  of thrombus. Normal compressibility and flow on color Doppler imaging. Femoral Vein: No evidence of thrombus. Normal compressibility, respiratory phasicity and response to augmentation. Popliteal Vein: No evidence of thrombus. Normal compressibility, respiratory phasicity and response to augmentation. Calf Veins: No evidence of thrombus. Normal compressibility and flow on color Doppler imaging. Superficial Great Saphenous Vein: No evidence of thrombus. Normal compressibility. Venous Reflux:  None. Other Findings:  None. LEFT LOWER EXTREMITY Common Femoral  Vein: No evidence of thrombus. Normal compressibility, respiratory phasicity and response to augmentation. Saphenofemoral Junction: No evidence of thrombus. Normal compressibility and flow on color Doppler imaging. Profunda Femoral Vein: No evidence of thrombus. Normal compressibility and flow on color Doppler imaging. Femoral Vein: No evidence of thrombus. Normal compressibility, respiratory phasicity and response to augmentation. Popliteal Vein: No evidence of thrombus. Normal compressibility, respiratory phasicity and response to augmentation. Calf Veins: No evidence of thrombus. Normal compressibility and flow on color Doppler imaging. Superficial Great Saphenous Vein: No evidence of thrombus. Normal compressibility. Venous Reflux:  None. Other Findings:  None. IMPRESSION: No evidence of deep venous thrombosis in either lower extremity. Electronically Signed   By: Aram Candela M.D.   On: 08/21/2023 00:15   CT Angio Chest Pulmonary Embolism (PE) W or WO Contrast  Result Date: 08/20/2023 CLINICAL DATA:  Pulmonary embolism (PE) suspected, high prob EXAM: CT ANGIOGRAPHY CHEST WITH CONTRAST TECHNIQUE: Multidetector CT imaging of the chest was performed using the standard protocol during bolus administration of intravenous contrast. Multiplanar CT image reconstructions and MIPs were obtained to evaluate the vascular anatomy. RADIATION DOSE REDUCTION: This  exam was performed according to the departmental dose-optimization program which includes automated exposure control, adjustment of the mA and/or kV according to patient size and/or use of iterative reconstruction technique. CONTRAST:  75mL OMNIPAQUE IOHEXOL 350 MG/ML SOLN COMPARISON:  Chest x-ray 08/20/2023, CT angio chest 04/19/2023. FINDINGS: Cardiovascular: Fair opacification of the pulmonary arteries to the segmental level. No evidence of pulmonary central or segmental embolism. Limited evaluation of the subsegmental level due to motion artifact and timing of contrast. Normal heart size. No significant pericardial effusion. The thoracic aorta is normal in caliber. Mild-to-moderate atherosclerotic plaque of the thoracic aorta. No coronary artery calcifications. Aortic valve leaflet and mitral annular calcifications. Mediastinum/Nodes: Left axillary lymph node dissection. No enlarged axillary lymph nodes. Slightly more conspicuous enlarged right hilar lymph node measuring 1.2 cm. No left hilar lymphadenopathy. Slightly more conspicuous enlarged mediastinal lymph nodes with a 1 cm prevascular and 1.1 cm right paratracheal lymph node. Thyroid gland, trachea, and esophagus demonstrate no significant findings. Lungs/Pleura: Mild centrilobular emphysematous changes. Chronic peripheral reticulation and cystic changes. No focal consolidation. No pulmonary nodule. No pulmonary mass. No pleural effusion. No pneumothorax. Upper Abdomen: No acute abnormality. Fluid density lesions within bilateral kidneys likely represent simple renal cysts. Simple renal cysts, in the absence of clinically indicated signs/symptoms, require no independent follow-up. Musculoskeletal: No chest wall abnormality. No suspicious lytic or blastic osseous lesions. No acute displaced fracture. Multilevel degenerative changes of the spine. Review of the MIP images confirms the above findings. IMPRESSION: 1. No central or segmental pulmonary embolus  with markedly limited evaluation more distally due to timing of contrast and motion artifact. 2. No acute intrathoracic abnormality. 3. Slightly more conspicuous mediastinal and right hilar lymphadenopathy. Unclear etiology. Recommend attention on follow-up. 4. Chronic pulmonary fibrosis/interstitial lung disease changes. 5. Emphysema (ICD10-J43.9). 6. Aortic Atherosclerosis (ICD10-I70.0) including mitral annular and aortic valve leaflet calcifications-correlate for aortic stenosis. Electronically Signed   By: Tish Frederickson M.D.   On: 08/20/2023 23:29   CT HEAD WO CONTRAST ( )  Result Date: 08/20/2023 CLINICAL DATA:  Head trauma, moderate-severe; Neck trauma (Age >= 65y) EXAM: CT HEAD WITHOUT CONTRAST CT CERVICAL SPINE WITHOUT CONTRAST TECHNIQUE: Multidetector CT imaging of the head and cervical spine was performed following the standard protocol without intravenous contrast. Multiplanar CT image reconstructions of the cervical spine were also generated. RADIATION DOSE REDUCTION: This exam was performed according  to the departmental dose-optimization program which includes automated exposure control, adjustment of the mA and/or kV according to patient size and/or use of iterative reconstruction technique. COMPARISON:  CT Head 02/25/23 CT c spine 02/25/23 FINDINGS: CT HEAD FINDINGS Brain: No hemorrhage. No hydrocephalus. No extra-axial fluid collection. No CT evidence of an acute cortical infarct. No mass effect. No mass lesion. Vascular: No hyperdense vessel or unexpected calcification. Skull: Normal. Negative for fracture or focal lesion. Sinuses/Orbits: No middle ear or mastoid effusion. Paranasal sinuses are clear. Orbits are unremarkable. Other: None. CT CERVICAL SPINE FINDINGS Alignment: Straightening of the normal cervical lordosis. Skull base and vertebrae: No acute fracture. No primary bone lesion or focal pathologic process. Soft tissues and spinal canal: No prevertebral fluid or swelling. No visible  canal hematoma. Disc levels:  No CT evidence of high-grade spinal canal stenosis. Upper chest: Negative. Other: No IMPRESSION: 1. No CT evidence of intracranial injury. 2. No acute fracture or traumatic subluxation of the cervical spine. Electronically Signed   By: Lorenza Cambridge M.D.   On: 08/20/2023 19:03   CT Cervical Spine Wo Contrast  Result Date: 08/20/2023 CLINICAL DATA:  Head trauma, moderate-severe; Neck trauma (Age >= 65y) EXAM: CT HEAD WITHOUT CONTRAST CT CERVICAL SPINE WITHOUT CONTRAST TECHNIQUE: Multidetector CT imaging of the head and cervical spine was performed following the standard protocol without intravenous contrast. Multiplanar CT image reconstructions of the cervical spine were also generated. RADIATION DOSE REDUCTION: This exam was performed according to the departmental dose-optimization program which includes automated exposure control, adjustment of the mA and/or kV according to patient size and/or use of iterative reconstruction technique. COMPARISON:  CT Head 02/25/23 CT c spine 02/25/23 FINDINGS: CT HEAD FINDINGS Brain: No hemorrhage. No hydrocephalus. No extra-axial fluid collection. No CT evidence of an acute cortical infarct. No mass effect. No mass lesion. Vascular: No hyperdense vessel or unexpected calcification. Skull: Normal. Negative for fracture or focal lesion. Sinuses/Orbits: No middle ear or mastoid effusion. Paranasal sinuses are clear. Orbits are unremarkable. Other: None. CT CERVICAL SPINE FINDINGS Alignment: Straightening of the normal cervical lordosis. Skull base and vertebrae: No acute fracture. No primary bone lesion or focal pathologic process. Soft tissues and spinal canal: No prevertebral fluid or swelling. No visible canal hematoma. Disc levels:  No CT evidence of high-grade spinal canal stenosis. Upper chest: Negative. Other: No IMPRESSION: 1. No CT evidence of intracranial injury. 2. No acute fracture or traumatic subluxation of the cervical spine.  Electronically Signed   By: Lorenza Cambridge M.D.   On: 08/20/2023 19:03   DG Chest Portable 1 View  Result Date: 08/20/2023 CLINICAL DATA:  Cough and weakness EXAM: PORTABLE CHEST 1 VIEW COMPARISON:  X-ray 06/18/2023 and older. FINDINGS: Underinflation. Diffuse interstitial changes are identified, slightly more pronounced today than previous. Elevation of the right hemidiaphragm. Slightly more opacity at the bases. No pneumothorax. Enlarged cardiopericardial silhouette. Tortuous ectatic aorta. Surgical clips in the left axillary region. IMPRESSION: Slight increase in lung base opacities. Subtle infiltrate is possible. Recommend follow-up. Underlying fibrotic, interstitial changes again seen of the lungs. Please correlate with the history. Elevation of the right hemidiaphragm Electronically Signed   By: Karen Kays M.D.   On: 08/20/2023 17:05   MR BRAIN WO CONTRAST  Result Date: 08/11/2023 CLINICAL DATA:  Dizziness, hypertension EXAM: MRI HEAD WITHOUT CONTRAST TECHNIQUE: Multiplanar, multiecho pulse sequences of the brain and surrounding structures were obtained without intravenous contrast. COMPARISON:  02/16/2021 MRI head, correlation is also made with 08/11/2023 CTA head and neck  FINDINGS: Brain: No restricted diffusion to suggest acute or subacute infarct. No acute hemorrhage, mass, mass effect, or midline shift. No hydrocephalus or extra-axial collection. Pituitary and craniocervical junction within normal limits. No hemosiderin deposition to suggest remote hemorrhage. Scattered and confluent T2 hyperintense signal in the periventricular white matter and pons, likely the sequela of moderate chronic small vessel ischemic disease. Vascular: Normal arterial flow voids. Skull and upper cervical spine: Normal marrow signal. Sinuses/Orbits: Clear paranasal sinuses. No acute finding in the orbits. IMPRESSION: No acute intracranial process. No evidence of acute or subacute infarct. Electronically Signed   By:  Wiliam Ke M.D.   On: 08/11/2023 21:48   CT ANGIO HEAD NECK W WO CM  Result Date: 08/11/2023 CLINICAL DATA:  Stroke suspected EXAM: CT ANGIOGRAPHY HEAD AND NECK WITH AND WITHOUT CONTRAST TECHNIQUE: Multidetector CT imaging of the head and neck was performed using the standard protocol during bolus administration of intravenous contrast. Multiplanar CT image reconstructions and MIPs were obtained to evaluate the vascular anatomy. Carotid stenosis measurements (when applicable) are obtained utilizing NASCET criteria, using the distal internal carotid diameter as the denominator. RADIATION DOSE REDUCTION: This exam was performed according to the departmental dose-optimization program which includes automated exposure control, adjustment of the mA and/or kV according to patient size and/or use of iterative reconstruction technique. CONTRAST:  OMNIPAQUE IOHEXOL 350 MG/ML SOLN COMPARISON:  06/18/2023 CT head, no prior CTA FINDINGS: CT HEAD FINDINGS Brain: No evidence of acute infarct, hemorrhage, mass, mass effect, or midline shift. No hydrocephalus or extra-axial fluid collection. Age related cerebral atrophy. Periventricular white matter changes, likely the sequela of chronic small vessel ischemic disease. Vascular: No hyperdense vessel. Skull: Negative for fracture or focal lesion. Sinuses/Orbits: No acute finding. Other: The mastoid air cells are well aerated. CTA NECK FINDINGS Aortic arch: Standard branching. Imaged portion shows no evidence of aneurysm or dissection. No significant stenosis of the major arch vessel origins. Aortic atherosclerosis. Right carotid system: No evidence of dissection, occlusion, or hemodynamically significant stenosis (greater than 50%). Left carotid system: No evidence of dissection, occlusion, or hemodynamically significant stenosis (greater than 50%). Vertebral arteries: No evidence of dissection, occlusion, or hemodynamically significant stenosis (greater than 50%).  Skeleton: No acute osseous abnormality. Degenerative changes in the cervical spine. Other neck: No acute finding. Upper chest: No new focal pulmonary opacity or pleural effusion. Peripheral interstitial fibrosis, which appears similar to 04/19/2023. Review of the MIP images confirms the above findings CTA HEAD FINDINGS Anterior circulation: Both internal carotid arteries are patent to the termini, with calcifications but without significant stenosis. A1 segments patent. Normal anterior communicating artery. Anterior cerebral arteries are patent to their distal aspects without significant stenosis. No M1 stenosis or occlusion. MCA branches perfused to their distal aspects without significant stenosis. Posterior circulation: The left vertebral artery primarily supplies the left PICA. Right dominant system. Vertebral arteries patent to the vertebrobasilar junction without significant stenosis. Posterior inferior cerebellar arteries patent proximally. Basilar patent to its distal aspect without significant stenosis. Superior cerebellar arteries patent proximally. Patent P1 segments. PCAs perfused to their distal aspects without significant stenosis. The right posterior communicating artery is patent. Venous sinuses: As permitted by contrast timing, patent. Anatomic variants: None significant. No evidence of aneurysm or vascular malformation. Review of the MIP images confirms the above findings IMPRESSION: 1. No acute intracranial process. 2. No intracranial large vessel occlusion or significant stenosis. 3. No hemodynamically significant stenosis in the neck. 4. Aortic atherosclerosis. Aortic Atherosclerosis (ICD10-I70.0). Electronically Signed   By:  Wiliam Ke M.D.   On: 08/11/2023 21:46   CT ABDOMEN PELVIS W CONTRAST  Result Date: 08/11/2023 CLINICAL DATA:  Hypertension and abdominal pain, initial encounter EXAM: CT ABDOMEN AND PELVIS WITH CONTRAST TECHNIQUE: Multidetector CT imaging of the abdomen and pelvis  was performed using the standard protocol following bolus administration of intravenous contrast. RADIATION DOSE REDUCTION: This exam was performed according to the departmental dose-optimization program which includes automated exposure control, adjustment of the mA and/or kV according to patient size and/or use of iterative reconstruction technique. CONTRAST:  OMNIPAQUE IOHEXOL 350 MG/ML SOLN COMPARISON:  04/19/2023 FINDINGS: Lower chest: Chronic scarring is noted in the bases bilaterally stable from the prior exam. No acute abnormality is seen. Hepatobiliary: No focal liver abnormality is seen. No gallstones, gallbladder wall thickening, or biliary dilatation. Pancreas: Unremarkable. No pancreatic ductal dilatation or surrounding inflammatory changes. Spleen: Normal in size without focal abnormality. Adrenals/Urinary Tract: Adrenal glands are within normal limits. Kidneys demonstrate simple cystic change bilaterally stable in appearance from the prior exam. No follow-up is recommended. Anterior nonobstructing stone is noted on the right measuring 5 mm. No ureteral stones are seen. The bladder is partially distended. Stomach/Bowel: Mild diverticulosis of the colon is noted. No findings to suggest diverticulitis are seen. The appendix has been surgically removed. Small bowel and stomach are within normal limits. Vascular/Lymphatic: Atherosclerotic calcifications are noted. Fusiform dilatation of the infrarenal aorta to 3.1 cm is noted. No lymphadenopathy is noted. Reproductive: Status post hysterectomy. No adnexal masses. Other: No abdominal wall hernia or abnormality. No abdominopelvic ascites. Musculoskeletal: No acute or significant osseous findings. IMPRESSION: 5 mm nonobstructing right renal stone. Diverticulosis without diverticulitis. 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 Recommend surveillance ultrasound in 3 years. Reference: Journal of Vascular Surgery 67.1 (2018): 2-77. J Am Coll Radiol  [phone]. Electronically Signed   By: Alcide Clever M.D.   On: 08/11/2023 21:46    Microbiology: Results for orders placed or performed during the hospital encounter of 08/20/23  Blood culture (routine x 2)     Status: None   Collection Time: 08/20/23  5:09 PM   Specimen: BLOOD  Result Value Ref Range Status   Specimen Description BLOOD BLOOD RIGHT FOREARM  Final   Special Requests   Final    BOTTLES DRAWN AEROBIC AND ANAEROBIC Blood Culture results may not be optimal due to an inadequate volume of blood received in culture bottles   Culture   Final    NO GROWTH 5 DAYS Performed at [facility], [address] [place], [place] [address]    Report Status 08/25/2023 FINAL  Final  SARS Coronavirus 2 by RT PCR (hospital order, performed in [facility] hospital lab) *cepheid single result test* Anterior Nasal Swab     Status: None   Collection Time: 08/20/23  5:09 PM   Specimen: Anterior Nasal Swab  Result Value Ref Range Status   SARS Coronavirus 2 by RT PCR NEGATIVE NEGATIVE Final    Comment: (NOTE) SARS-CoV-2 target nucleic acids are NOT DETECTED.  The SARS-CoV-2 RNA is generally detectable in upper and lower respiratory specimens during the acute phase of infection. The lowest concentration of SARS-CoV-2 viral copies this assay can detect is 250 copies / mL. A negative result does not preclude SARS-CoV-2 infection and should not be used as the sole basis for treatment or other patient management decisions.  A negative result may occur with improper specimen collection / handling, submission of specimen other than nasopharyngeal swab, presence of viral  mutation(s) within the areas targeted by this assay, and inadequate number of viral copies (<250 copies / mL). A negative result must be combined with clinical observations, patient history, and epidemiological information.  Fact Sheet for Patients:   RoadLapTop.co.za  Fact Sheet for  Healthcare Providers: http://kim-miller.com/  This test is not yet approved or  cleared by the [place] FDA and has been authorized for detection and/or diagnosis of SARS-CoV-2 by FDA under an Emergency Use Authorization (EUA).  This EUA will remain in effect (meaning this test can be used) for the duration of the COVID-19 declaration under Section 564(b)(1) of the Act, 21 U.S.C. section 360bbb-3(b)(1), unless the authorization is terminated or revoked sooner.  Performed at [facility], [address] [place], [place] [address]   Blood culture (routine x 2)     Status: None   Collection Time: 08/20/23  7:09 PM   Specimen: BLOOD  Result Value Ref Range Status   Specimen Description BLOOD LEFT ANTECUBITAL  Final   Special Requests   Final    BOTTLES DRAWN AEROBIC AND ANAEROBIC Blood Culture adequate volume   Culture   Final    NO GROWTH 5 DAYS Performed at [facility], [address] [place], [place] [address]    Report Status 08/25/2023 FINAL  Final  Expectorated Sputum Assessment w Gram Stain, Rflx to Resp Cult     Status: None   Collection Time: 08/20/23 11:48 PM   Specimen: Sputum  Result Value Ref Range Status   Specimen Description SPUTUM  Final   Special Requests NONE  Final   Sputum evaluation   Final    THIS SPECIMEN IS ACCEPTABLE FOR SPUTUM CULTURE Performed at [facility], [address] [place], [place] [address]    Report Status 08/21/2023 FINAL  Final  Culture, Respiratory w Gram Stain     Status: None   Collection Time: 08/20/23 11:48 PM   Specimen: SPU  Result Value Ref Range Status   Specimen Description   Final    SPUTUM Performed at [facility], [address], [place], [place] [address]    Special Requests   Final    NONE Reflexed from [phone] Performed at [facility], [address] [place], [place] [address]    Gram Stain   Final    FEW SQUAMOUS EPITHELIAL CELLS  PRESENT NO WBC SEEN RARE GRAM POSITIVE COCCI Performed at [facility], [address] [place], [place] [address]    Culture   Final    MODERATE PROTEUS PENNERI MODERATE STENOTROPHOMONAS MALTOPHILIA FEW CITROBACTER FREUNDII    Report Status 08/25/2023 FINAL  Final   Organism ID, Bacteria STENOTROPHOMONAS MALTOPHILIA  Final   Organism ID, Bacteria CITROBACTER FREUNDII  Final   Organism ID, Bacteria PROTEUS PENNERI  Final      Susceptibility   Citrobacter freundii - MIC*    CEFEPIME <=0.12 SENSITIVE Sensitive     CEFTAZIDIME <=1 SENSITIVE Sensitive     CEFTRIAXONE <=0.25 SENSITIVE Sensitive     CIPROFLOXACIN <=0.25 SENSITIVE Sensitive     GENTAMICIN <=1 SENSITIVE Sensitive     IMIPENEM <=0.25 SENSITIVE Sensitive     TRIMETH/SULFA <=20 SENSITIVE Sensitive     PIP/TAZO <=4 SENSITIVE Sensitive ug/mL    * FEW CITROBACTER FREUNDII   Proteus penneri - MIC*    AMPICILLIN >=32 RESISTANT Resistant     CEFEPIME <=0.12 SENSITIVE Sensitive     CEFTAZIDIME 2 SENSITIVE Sensitive     CEFTRIAXONE 32  RESISTANT Resistant     CIPROFLOXACIN <=0.25 SENSITIVE Sensitive     GENTAMICIN <=1 SENSITIVE Sensitive     IMIPENEM 2 SENSITIVE Sensitive     TRIMETH/SULFA <=20 SENSITIVE Sensitive     AMPICILLIN/SULBACTAM 16 INTERMEDIATE Intermediate     PIP/TAZO <=4 SENSITIVE Sensitive ug/mL    * MODERATE PROTEUS PENNERI   Stenotrophomonas maltophilia - MIC*    LEVOFLOXACIN 1 SENSITIVE Sensitive     TRIMETH/SULFA <=20 SENSITIVE Sensitive     * MODERATE STENOTROPHOMONAS MALTOPHILIA    Labs: CBC: Recent Labs  Lab 08/30/23 0336 09/01/23 0327 09/02/23 0504 09/03/23 0625  WBC  --  7.6  --   --   HGB 8.7* 9.2* 8.9* 9.3*  HCT  --  27.0*  --   --   MCV  --  84.6  --   --   PLT  --  194  --   --    Basic Metabolic Panel: Recent Labs  Lab 08/30/23 0340 09/02/23 0504 09/03/23 0625  NA 142 141 141  K 4.1 3.7 3.9  CL 108 108 109  CO2 28 27 25   GLUCOSE 90 81 89  BUN 22 26* 27*  CREATININE  1.11* 1.03* 1.09*  CALCIUM 8.7* 8.8* 9.0   Liver Function Tests: No results for input(s): "AST", "ALT", "ALKPHOS", "BILITOT", "PROT", "ALBUMIN" in the last 168 hours. CBG: Recent Labs  Lab 08/28/23 1801 08/29/23 0729 08/29/23 2107 08/30/23 0641 08/30/23 0818  GLUCAP 117* 84 89 97 81    Discharge time spent: greater than 30 minutes.  This record has been created using Conservation officer, historic buildings. Errors have been sought and corrected,but may not always be located. Such creation errors do not reflect on the standard of care.   Signed: Arnetha Courser, MD [facility] 09/03/2023

## 2023-09-03 NOTE — TOC Transition Note (Signed)
Transition of Care [facility]) - CM/SW Discharge Note   Patient Details  Name: Tracy Dickerson MRN: [id number] Date of Birth: 29-Jul-1941  [facility] CM/SW Contact:  Garret Reddish, RN Phone Number: 09/03/2023, 4:30 PM   Clinical Narrative:     Chart reviewed.  Noted that patient has orders for discharge today.  I have spoken with Clide Cliff at [facility].  He informs me that he will have a bed for patient today.  Clide Cliff reports that patient will go to room E 1 and the number to call report is [phone].  I have sent patient's Discharge Summary, SNF packet, and Discharge Orders to via Epic hub.   I have informed patient and her brother [name] that patient [facility] will transport patient to the facility today.    I have arranged EMS transport with [facility] for transport to the facility today.    I have informed staff nurse of the above information.     Final next level of care: Skilled Nursing Facility Barriers to Discharge: No Barriers Identified   Patient Goals and CMS Choice CMS Medicare.gov Compare Post Acute Care list provided to:: Patient Represenative (must comment) [name] ( patient 's brother)) Choice offered to / list presented to : Sibling  Discharge Placement                Patient chooses bed at:  [facility]) Patient to be transferred to facility by: [facility] Name of family member notified: Tillman Sers Patient and family notified of of transfer: 09/03/23  Discharge Plan and Services Additional resources added to the After Visit Summary for                                       Social Determinants of Health (SDOH) Interventions SDOH Screenings   Food Insecurity: No Food Insecurity (08/21/2023)  Housing: Medium Risk (08/21/2023)  Transportation Needs: No Transportation Needs (08/21/2023)  Utilities: Not At Risk (08/21/2023)  Financial Resource Strain: Low Risk  (02/04/2023)    [facility] from [facility]  Physical Activity: Inactive (07/16/2021)   Received from [facility]  Stress: No Stress Concern Present (07/16/2021)   [facility] from [facility]  Tobacco Use: Medium Risk (08/23/2023)  Health Literacy: Medium Risk (07/16/2021)   Received from [facility]     Readmission Risk Interventions     No data to display

## 2023-09-04 ENCOUNTER — Emergency Department: Payer: 59

## 2023-09-04 ENCOUNTER — Inpatient Hospital Stay
Admission: EM | Admit: 2023-09-04 | Discharge: 2023-12-31 | DRG: 193 | Disposition: A | Discharge status: Other Acute Inpatient Hospital | Payer: 59 | Attending: Internal Medicine | Admitting: Internal Medicine

## 2023-09-04 ENCOUNTER — Other Ambulatory Visit: Payer: Self-pay

## 2023-09-04 ENCOUNTER — Encounter: Payer: Self-pay | Admitting: Emergency Medicine

## 2023-09-04 DIAGNOSIS — G4700 Insomnia, unspecified: Secondary | ICD-10-CM | POA: Diagnosis present

## 2023-09-04 DIAGNOSIS — I480 Paroxysmal atrial fibrillation: Secondary | ICD-10-CM | POA: Diagnosis present

## 2023-09-04 DIAGNOSIS — J189 Pneumonia, unspecified organism: Principal | ICD-10-CM | POA: Diagnosis present

## 2023-09-04 DIAGNOSIS — Z7951 Long term (current) use of inhaled steroids: Secondary | ICD-10-CM

## 2023-09-04 DIAGNOSIS — F0390 Unspecified dementia without behavioral disturbance: Secondary | ICD-10-CM | POA: Diagnosis present

## 2023-09-04 DIAGNOSIS — F03918 Unspecified dementia, unspecified severity, with other behavioral disturbance: Secondary | ICD-10-CM | POA: Diagnosis present

## 2023-09-04 DIAGNOSIS — J849 Interstitial pulmonary disease, unspecified: Secondary | ICD-10-CM | POA: Diagnosis present

## 2023-09-04 DIAGNOSIS — I129 Hypertensive chronic kidney disease with stage 1 through stage 4 chronic kidney disease, or unspecified chronic kidney disease: Secondary | ICD-10-CM | POA: Diagnosis present

## 2023-09-04 DIAGNOSIS — F02B11 Dementia in other diseases classified elsewhere, moderate, with agitation: Secondary | ICD-10-CM

## 2023-09-04 DIAGNOSIS — I10 Essential (primary) hypertension: Secondary | ICD-10-CM | POA: Diagnosis present

## 2023-09-04 DIAGNOSIS — G301 Alzheimer's disease with late onset: Secondary | ICD-10-CM

## 2023-09-04 DIAGNOSIS — W182XXA Fall in (into) shower or empty bathtub, initial encounter: Secondary | ICD-10-CM | POA: Diagnosis present

## 2023-09-04 DIAGNOSIS — R451 Restlessness and agitation: Principal | ICD-10-CM

## 2023-09-04 DIAGNOSIS — J449 Chronic obstructive pulmonary disease, unspecified: Secondary | ICD-10-CM | POA: Diagnosis present

## 2023-09-04 DIAGNOSIS — E785 Hyperlipidemia, unspecified: Secondary | ICD-10-CM | POA: Diagnosis present

## 2023-09-04 DIAGNOSIS — N1831 Chronic kidney disease, stage 3a: Secondary | ICD-10-CM | POA: Diagnosis present

## 2023-09-04 DIAGNOSIS — R911 Solitary pulmonary nodule: Secondary | ICD-10-CM | POA: Diagnosis present

## 2023-09-04 DIAGNOSIS — J9601 Acute respiratory failure with hypoxia: Secondary | ICD-10-CM | POA: Diagnosis present

## 2023-09-04 DIAGNOSIS — K219 Gastro-esophageal reflux disease without esophagitis: Secondary | ICD-10-CM | POA: Diagnosis present

## 2023-09-04 DIAGNOSIS — Z7901 Long term (current) use of anticoagulants: Secondary | ICD-10-CM

## 2023-09-04 DIAGNOSIS — Z5919 Other inadequate housing: Secondary | ICD-10-CM

## 2023-09-04 DIAGNOSIS — S0003XA Contusion of scalp, initial encounter: Secondary | ICD-10-CM | POA: Diagnosis present

## 2023-09-04 DIAGNOSIS — A419 Sepsis, unspecified organism: Secondary | ICD-10-CM

## 2023-09-04 DIAGNOSIS — B888 Other specified infestations: Secondary | ICD-10-CM | POA: Diagnosis present

## 2023-09-04 DIAGNOSIS — F03911 Unspecified dementia, unspecified severity, with agitation: Secondary | ICD-10-CM | POA: Diagnosis present

## 2023-09-04 DIAGNOSIS — J440 Chronic obstructive pulmonary disease with acute lower respiratory infection: Secondary | ICD-10-CM | POA: Diagnosis present

## 2023-09-04 DIAGNOSIS — G4730 Sleep apnea, unspecified: Secondary | ICD-10-CM | POA: Diagnosis present

## 2023-09-04 DIAGNOSIS — Z87891 Personal history of nicotine dependence: Secondary | ICD-10-CM

## 2023-09-04 DIAGNOSIS — Z1152 Encounter for screening for COVID-19: Secondary | ICD-10-CM

## 2023-09-04 DIAGNOSIS — N179 Acute kidney failure, unspecified: Secondary | ICD-10-CM | POA: Diagnosis present

## 2023-09-04 DIAGNOSIS — Z90710 Acquired absence of both cervix and uterus: Secondary | ICD-10-CM

## 2023-09-04 DIAGNOSIS — Y92238 Other place in hospital as the place of occurrence of the external cause: Secondary | ICD-10-CM | POA: Diagnosis present

## 2023-09-04 DIAGNOSIS — Z885 Allergy status to narcotic agent status: Secondary | ICD-10-CM

## 2023-09-04 DIAGNOSIS — E1122 Type 2 diabetes mellitus with diabetic chronic kidney disease: Secondary | ICD-10-CM | POA: Diagnosis present

## 2023-09-04 DIAGNOSIS — I951 Orthostatic hypotension: Secondary | ICD-10-CM | POA: Diagnosis present

## 2023-09-04 DIAGNOSIS — Z853 Personal history of malignant neoplasm of breast: Secondary | ICD-10-CM

## 2023-09-04 DIAGNOSIS — Z79899 Other long term (current) drug therapy: Secondary | ICD-10-CM

## 2023-09-04 DIAGNOSIS — Z880 Allergy status to penicillin: Secondary | ICD-10-CM

## 2023-09-04 LAB — CBC WITH DIFFERENTIAL/PLATELET
Abs Immature Granulocytes: 0.04 10*3/uL (ref 0.00–0.07)
Basophils Absolute: 0.1 10*3/uL (ref 0.0–0.1)
Basophils Relative: 1 %
Eosinophils Absolute: 0.2 10*3/uL (ref 0.0–0.5)
Eosinophils Relative: 3 %
HCT: 44 % (ref 36.0–46.0)
Hemoglobin: 13.2 g/dL (ref 12.0–15.0)
Immature Granulocytes: 1 %
Lymphocytes Relative: 16 %
Lymphs Abs: 1 10*3/uL (ref 0.7–4.0)
MCH: 25.5 pg — ABNORMAL LOW (ref 26.0–34.0)
MCHC: 30 g/dL (ref 30.0–36.0)
MCV: 84.9 fL (ref 80.0–100.0)
Monocytes Absolute: 0.5 10*3/uL (ref 0.1–1.0)
Monocytes Relative: 7 %
Neutro Abs: 4.9 10*3/uL (ref 1.7–7.7)
Neutrophils Relative %: 72 %
Platelets: 173 10*3/uL (ref 150–400)
RBC: 5.18 MIL/uL — ABNORMAL HIGH (ref 3.87–5.11)
RDW: 21.7 % — ABNORMAL HIGH (ref 11.5–15.5)
Smear Review: NORMAL
WBC: 6.6 10*3/uL (ref 4.0–10.5)
nRBC: 0 % (ref 0.0–0.2)

## 2023-09-04 LAB — URINALYSIS, ROUTINE W REFLEX MICROSCOPIC
Bilirubin Urine: NEGATIVE
Glucose, UA: NEGATIVE mg/dL
Hgb urine dipstick: NEGATIVE
Ketones, ur: NEGATIVE mg/dL
Leukocytes,Ua: NEGATIVE
Nitrite: NEGATIVE
Protein, ur: NEGATIVE mg/dL
Specific Gravity, Urine: 1.016 (ref 1.005–1.030)
pH: 6 (ref 5.0–8.0)

## 2023-09-04 LAB — BASIC METABOLIC PANEL
Anion gap: 8 (ref 5–15)
BUN: 26 mg/dL — ABNORMAL HIGH (ref 8–23)
CO2: 25 mmol/L (ref 22–32)
Calcium: 9.1 mg/dL (ref 8.9–10.3)
Chloride: 108 mmol/L (ref 98–111)
Creatinine, Ser: 1.2 mg/dL — ABNORMAL HIGH (ref 0.44–1.00)
GFR, Estimated: 46 mL/min — ABNORMAL LOW (ref >60)
Glucose, Bld: 128 mg/dL — ABNORMAL HIGH (ref 70–99)
Potassium: 3.8 mmol/L (ref 3.5–5.1)
Sodium: 141 mmol/L (ref 135–145)

## 2023-09-04 LAB — ACTH: C206 ACTH: 3.1 pg/mL — ABNORMAL LOW (ref 7.2–63.3)

## 2023-09-04 NOTE — Consult Note (Signed)
Telepsych Consultation   Reason for Consult:  Psych er  Referring Physician:  Dr. Rosalia Hammers Location of Patient: [facility] ER Location of Provider: Behavioral Health TTS Department  Patient Identification: Tracy Dickerson MRN:  [id number] Principal Diagnosis: Dementia [facility]) Diagnosis:  Principal Problem:   Dementia (HCC) Active Problems:   Acute kidney injury superimposed on chronic kidney disease (HCC)   Infestation by bed bug   Sleep apnea   Total Time spent with patient: 30 minutes  Subjective:    HPI:  Tele psych Assessment   Tracy Dickerson, 82 y.o., female patient seen via tele health by TTS and this provider; chart reviewed and consulted with Dr. Clerance Dickerson on 09/05/23.  Per chart review,  EDP HPI states, that patient was discharged from the hospital at [facility] care yesterday.  Per report, staff noticed bedbugs on the patient.  She did not believe that she had bedbugs, and became acutely agitated and was throwing things.  Patient tells me that she does not believe she has bedbugs, but is unsure why she is in the ER.  He states that he did review her discharge summary from yesterday.  At that time, patient presented with syncope and weakness.  She was found to have orthostatic hypotension for which she was placed on midodrine.  She was discharged to [facility], but per staff patient was found to have bedbugs and the patient became combative when they tried to take her clothes off.   On evaluation OSHYN METRO reports she had to go see a doctor yesterday. She says she was angry because she had move to "a place".  Patient is confused and disoriented but at no risk to herself or others, as she denies SI/HI and AVH.  She is psychiatrically cleared.  Recommendations: Patient is psych cleared and is able to return to her care home.    Dr.  informed of above recommendation and disposition  Past Psychiatric History: Dementia  Risk to Self:   Risk to Others:   Prior Inpatient  Therapy:   Prior Outpatient Therapy:    Past Medical History:  Past Medical History:  Diagnosis Date   Cancer (HCC)    Breast and lung   Diabetes mellitus without complication (HCC)    GERD (gastroesophageal reflux disease)    Heart disease    Hypertension     Past Surgical History:  Procedure Laterality Date   ABDOMINAL HYSTERECTOMY     APPENDECTOMY     BREAST SURGERY     breast cancer LEFT   COLONOSCOPY WITH PROPOFOL N/A 08/23/2023   Procedure: COLONOSCOPY WITH PROPOFOL;  Surgeon: Toney Reil, MD;  Location: [facility] ENDOSCOPY;  Service: Gastroenterology;  Laterality: N/A;   ESOPHAGOGASTRODUODENOSCOPY N/A 08/23/2023   Procedure: ESOPHAGOGASTRODUODENOSCOPY (EGD);  Surgeon: Toney Reil, MD;  Location: [facility] ENDOSCOPY;  Service: Gastroenterology;  Laterality: N/A;   ESOPHAGOGASTRODUODENOSCOPY (EGD) WITH PROPOFOL N/A 01/07/2023   Procedure: ESOPHAGOGASTRODUODENOSCOPY (EGD) WITH PROPOFOL;  Surgeon: Midge Minium, MD;  Location: [facility] ENDOSCOPY;  Service: Endoscopy;  Laterality: N/A;   GIVENS CAPSULE STUDY N/A 08/23/2023   Procedure: GIVENS CAPSULE STUDY;  Surgeon: Toney Reil, MD;  Location: [facility] ENDOSCOPY;  Service: Gastroenterology;  Laterality: N/A;   OPEN REDUCTION INTERNAL FIXATION (ORIF) TIBIA/FIBULA FRACTURE Left 02/03/2018   Procedure: OPEN REDUCTION INTERNAL FIXATION (ORIF) DISTAL TIBIA FRACTURE;  Surgeon: Christena Flake, MD;  Location: [facility] ORS;  Service: Orthopedics;  Laterality: Left;  ankle/lower leg   TONSILLECTOMY     Family  History:  Family History  Problem Relation Age of Onset   Cancer Mother    Family Psychiatric  History: unknown Social History:  Social History   Substance and Sexual Activity  Alcohol Use Not Currently     Social History   Substance and Sexual Activity  Drug Use No    Social History   Socioeconomic History   Marital status: Widowed    Spouse name: Not on file   Number of children: Not on file   Years of education:  Not on file   Highest education level: Not on file  Occupational History   Not on file  Tobacco Use   Smoking status: Former   Smokeless tobacco: Never  Vaping Use   Vaping status: Never Used  Substance and Sexual Activity   Alcohol use: Not Currently   Drug use: No   Sexual activity: Not Currently  Other Topics Concern   Not on file  Social History Narrative   Not on file   Social Drivers of Health   Financial Resource Strain: Low Risk  (02/04/2023)   Received from [facility], [facility]   Overall Financial Resource Strain (CARDIA)    Difficulty of Paying Living Expenses: Not hard at all  Food Insecurity: No Food Insecurity (08/21/2023)   Hunger Vital Sign    Worried About Running Out of Food in the Last Year: Never true    Ran Out of Food in the Last Year: Never true  Transportation Needs: No Transportation Needs (08/21/2023)   PRAPARE - Administrator, Civil Service (Medical): No    Lack of Transportation (Non-Medical): No  Physical Activity: Inactive (07/16/2021)   Received from [facility], [facility]   Exercise Vital Sign    Days of Exercise per Week: 0 days    Minutes of Exercise per Session: 0 min  Stress: No Stress Concern Present (07/16/2021)   Received from [facility], [facility] - Occupational Stress Questionnaire    Feeling of Stress : Not at all  Social Connections: Not on file   Additional Social History:    Allergies:   Allergies  Allergen Reactions   Oxycodone    Percocet [Oxycodone-Acetaminophen] Itching   Penicillins Rash    Has patient had a PCN reaction causing immediate rash, facial/tongue/throat swelling, SOB or lightheadedness with hypotension: Yes Has patient had a PCN reaction causing severe rash involving mucus membranes or skin necrosis: Unknown Has patient had a PCN reaction that required hospitalization: Unknown Has patient had a PCN reaction occurring  within the last 10 years: No If all of the above answers are "NO", then may proceed with Cephalosporin use.    Labs:  Results for orders placed or performed during the hospital encounter of 09/04/23 (from the past 48 hours)  Basic metabolic panel     Status: Abnormal   Collection Time: 09/04/23 11:22 AM  Result Value Ref Range   Sodium 141 135 - 145 mmol/L   Potassium 3.8 3.5 - 5.1 mmol/L   Chloride 108 98 - 111 mmol/L   CO2 25 22 - 32 mmol/L   Glucose, Bld 128 (H) 70 - 99 mg/dL    Comment: Glucose reference range applies only to samples taken after fasting for at least 8 hours.   BUN 26 (H) 8 - 23 mg/dL   Creatinine, Ser 7.82 (H) 0.44 - 1.00 mg/dL   Calcium 9.1 8.9 - 10.3  mg/dL   GFR, Estimated 46 (L) >60 mL/min    Comment: (NOTE) Calculated using the CKD-EPI Creatinine Equation (2021)    Anion gap 8 5 - 15    Comment: Performed at [facility], [address] [place], [place] [address]  CBC with Differential     Status: Abnormal   Collection Time: 09/04/23 11:22 AM  Result Value Ref Range   WBC 6.6 4.0 - 10.5 K/uL   RBC 5.18 (H) 3.87 - 5.11 MIL/uL   Hemoglobin 13.2 12.0 - 15.0 g/dL   HCT 60.4 54.0 - 98.1 %   MCV 84.9 80.0 - 100.0 fL   MCH 25.5 (L) 26.0 - 34.0 pg   MCHC 30.0 30.0 - 36.0 g/dL   RDW 19.1 (H) 47.8 - 29.5 %   Platelets 173 150 - 400 K/uL   nRBC 0.0 0.0 - 0.2 %   Neutrophils Relative % 72 %   Neutro Abs 4.9 1.7 - 7.7 K/uL   Lymphocytes Relative 16 %   Lymphs Abs 1.0 0.7 - 4.0 K/uL   Monocytes Relative 7 %   Monocytes Absolute 0.5 0.1 - 1.0 K/uL   Eosinophils Relative 3 %   Eosinophils Absolute 0.2 0.0 - 0.5 K/uL   Basophils Relative 1 %   Basophils Absolute 0.1 0.0 - 0.1 K/uL   WBC Morphology MORPHOLOGY UNREMARKABLE    RBC Morphology MORPHOLOGY UNREMARKABLE    Smear Review Normal platelet morphology    Immature Granulocytes 1 %   Abs Immature Granulocytes 0.04 0.00 - 0.07 K/uL    Comment: Performed at [facility], [address] [place], [place] [address]  Urinalysis, Routine w reflex microscopic -Urine, Clean Catch     Status: Abnormal   Collection Time: 09/04/23 12:55 PM  Result Value Ref Range   Color, Urine YELLOW (A) YELLOW   APPearance CLEAR (A) CLEAR   Specific Gravity, Urine 1.016 1.005 - 1.030   pH 6.0 5.0 - 8.0   Glucose, UA NEGATIVE NEGATIVE mg/dL   Hgb urine dipstick NEGATIVE NEGATIVE   Bilirubin Urine NEGATIVE NEGATIVE   Ketones, ur NEGATIVE NEGATIVE mg/dL   Protein, ur NEGATIVE NEGATIVE mg/dL   Nitrite NEGATIVE NEGATIVE   Leukocytes,Ua NEGATIVE NEGATIVE    Comment: Performed at [facility], [address] [place], [place] [address]    Medications:  No current facility-administered medications for this encounter.   Current Outpatient Medications  Medication Sig Dispense Refill   acetaminophen (TYLENOL) 500 MG tablet Take 500 mg by mouth every 6 (six) hours as needed.     albuterol (PROAIR HFA) 108 (90 Base) MCG/ACT inhaler Inhale 2 puffs into the lungs every 4 (four) hours as needed for wheezing or shortness of breath.      apixaban (ELIQUIS) 5 MG TABS tablet Take 1 tablet (5 mg total) by mouth 2 (two) times daily. 90 tablet 1   atorvastatin (LIPITOR) 20 MG tablet Take 20 mg by mouth daily.     bisacodyl (DULCOLAX) 10 MG suppository Place 1 suppository (10 mg total) rectally daily as needed for severe constipation.     cetirizine (ZYRTEC) 5 MG tablet Take 1 tablet (5 mg total) by mouth daily. 30 tablet 2   cyanocobalamin (VITAMIN B12) 1000 MCG tablet Take 1 tablet (1,000 mcg total) by mouth daily. 90 tablet 1   fluticasone-salmeterol (ADVAIR) 250-50 MCG/ACT AEPB Inhale 1 puff into the lungs in the morning and at bedtime.     folic acid (FOLVITE) 1 MG tablet Take  1 tablet (1 mg total) by mouth daily.     gabapentin (NEURONTIN) 300 MG capsule Take 2 capsules (600 mg total) by mouth at bedtime.     hydrocortisone (CORTEF) 5 MG tablet Take 1 tablet (5 mg total) by mouth daily.      hydrOXYzine (ATARAX) 10 MG tablet Take 1 tablet (10 mg total) by mouth 3 (three) times daily as needed for itching.     meclizine (ANTIVERT) 12.5 MG tablet Take 1 tablet (12.5 mg total) by mouth 3 (three) times daily as needed for dizziness. 30 tablet 0   midodrine (PROAMATINE) 5 MG tablet Take 1 tablet (5 mg total) by mouth 3 (three) times daily with meals.     ondansetron (ZOFRAN ODT) 4 MG disintegrating tablet Take 1 tablet (4 mg total) by mouth every 8 (eight) hours as needed. (Patient not taking: Reported on 08/21/2023) 15 tablet 0   pantoprazole (PROTONIX) 40 MG tablet Take 1 tablet (40 mg total) by mouth 2 (two) times daily. 180 tablet 1   SPIRIVA HANDIHALER 18 MCG inhalation capsule Place 1 capsule (18 mcg total) into inhaler and inhale daily. 30 capsule 2   traMADol (ULTRAM) 50 MG tablet Take 1 tablet (50 mg total) by mouth 2 (two) times daily as needed. 30 tablet 0   triamcinolone ointment (KENALOG) 0.1 % Apply 1 Application topically 2 (two) times daily.     Vitamin D, Ergocalciferol, (DRISDOL) 1.25 MG (50000 UNIT) CAPS capsule Take 1 capsule (50,000 Units total) by mouth every 7 (seven) days for 7 doses.      Musculoskeletal: Strength & Muscle Tone: within normal limits Gait & Station: normal Patient leans: N/A   Psychiatric Specialty Exam:  Presentation  General Appearance: Appropriate for Environment; Disheveled  Eye Contact:Fair  Speech:Clear and Coherent  Speech Volume:Normal  Handedness:right  Mood and Affect  Mood:Anxious; Labile  Affect:Appropriate   Thought Process  Thought Processes:Disorganized  Descriptions of Associations:Loose  Orientation:Partial  Thought Content:WDL  History of Schizophrenia/Schizoaffective disorder:no Duration of Psychotic Symptoms:no Hallucinations:Hallucinations: None  Ideas of Reference:None  Suicidal Thoughts:Suicidal Thoughts: No  Homicidal Thoughts:Homicidal Thoughts: No   Sensorium  Memory:Immediate Poor;  Remote Poor  Judgment:Impaired  Insight:Poor   Executive Functions  Concentration:Poor  Attention Span:Poor  Recall:Poor  Fund of Knowledge:Poor  Language:Poor   Psychomotor Activity  Psychomotor Activity:Psychomotor Activity: Normal   Assets  Assets:Financial Resources/Insurance   Sleep  Sleep:poor   Physical Exam: Physical Exam Vitals and nursing note reviewed.  HENT:     Head: Normocephalic and atraumatic.     Nose: Nose normal.  Eyes:     Pupils: Pupils are equal, round, and reactive to light.  Pulmonary:     Effort: Pulmonary effort is normal.  Musculoskeletal:        General: Normal range of motion.     Cervical back: Normal range of motion.  Skin:    General: Skin is warm.  Neurological:     Mental Status: She is alert and oriented to person, place, and time. Mental status is at baseline.    Review of Systems  Psychiatric/Behavioral:  Positive for memory loss. Negative for depression, hallucinations, substance abuse and suicidal ideas. The patient is nervous/anxious.   All other systems reviewed and are negative.  Blood pressure (!) 126/58, pulse 74, temperature 97.6 F (36.4 C), temperature source Oral, resp. rate 18, height 5\' 6"  (1.676 m), weight 90.7 kg, SpO2 97%. Body mass index is 32.28 kg/m.   Disposition: No evidence of imminent risk to  self or others at present.   Patient does not meet criteria for psychiatric inpatient admission. Supportive therapy provided about ongoing stressors. Discussed crisis plan, support from social network, calling 911, coming to the Emergency Department, and calling Suicide Hotline.  This service was provided via telemedicine using a 2-way, interactive audio and video technology.    Jearld Lesch, NP 09/05/2023 1:59 AM

## 2023-09-04 NOTE — ED Notes (Signed)
First Nurse Note: Pt to ED via [facility] from [facility]. Pt was discharged from the hospital yesterday. Staff reports pt had bed bugs and that they took her clothes to wash them. Pt became combative because staff took her clothing and are keeping her in her room. Staff informed EMS that pt has been throwing chairs and her walker at them. Per staff at [facility] pt becomes very upset and starts yelling if bed bugs are mentioned.

## 2023-09-04 NOTE — ED Notes (Signed)
Per DON at [facility], Pt is not allowed to come back to [facility] due to concerns for staff safety

## 2023-09-04 NOTE — ED Provider Notes (Signed)
[facility] Provider Note    Event Date/Time   First MD Initiated Contact with Patient 09/04/23 1712     (approximate)   History   Agitation   HPI  Tracy Dickerson is a 82 year old female with history of HTN, DM, CKD presenting to the emergency department for evaluation of agitation.  Patient was discharged from the hospital at [facility] care yesterday.  Per report, staff noticed bedbugs on the patient.  She did not believe that she had bedbugs, and became acutely agitated and was throwing things.  Patient tells me that she does not believe she has bedbugs, but is unsure why she is in the ER.  I did review her discharge summary from yesterday.  At that time, patient presented with syncope and weakness.  She was found to have orthostatic hypotension for which she was placed on midodrine.  She was discharged to [facility], but per staff patient was found to have bedbugs and the patient became combative when they tried to take her clothes off.       Physical Exam   Triage Vital Signs: ED Triage Vitals  Encounter Vitals Group     BP 09/04/23 1033 (!) 126/58     Systolic BP Percentile --      Diastolic BP Percentile --      Pulse Rate 09/04/23 1033 74     Resp 09/04/23 1033 18     Temp 09/04/23 1033 97.6 F (36.4 C)     Temp Source 09/04/23 1033 Oral     SpO2 09/04/23 1033 97 %     Weight 09/04/23 1035 200 lb (90.7 kg)     Height 09/04/23 1035 5\' 6"  (1.676 m)     Head Circumference --      Peak Flow --      Pain Score 09/04/23 1046 0     Pain Loc --      Pain Education --      Exclude from Growth Chart --     Most recent vital signs: Vitals:   09/04/23 1033  BP: (!) 126/58  Pulse: 74  Resp: 18  Temp: 97.6 F (36.4 C)  SpO2: 97%     General: Awake, interactive  CV:  Regular rate, good peripheral perfusion.  Resp:  Unlabored respirations. Abd:  Nondistended.  Neuro:  Symmetric facial movement, fluid speech, moving extremity  spontaneously, oriented to person and place, not oriented to time, limited orientation to situation   ED Results / Procedures / Treatments   Labs (all labs ordered are listed, but only abnormal results are displayed) Labs Reviewed  BASIC METABOLIC PANEL - Abnormal; Notable for the following components:      Result Value   Glucose, Bld 128 (*)    BUN 26 (*)    Creatinine, Ser 1.20 (*)    GFR, Estimated 46 (*)    All other components within normal limits  CBC WITH DIFFERENTIAL/PLATELET - Abnormal; Notable for the following components:   RBC 5.18 (*)    MCH 25.5 (*)    RDW 21.7 (*)    All other components within normal limits  URINALYSIS, ROUTINE W REFLEX MICROSCOPIC - Abnormal; Notable for the following components:   Color, Urine YELLOW (*)    APPearance CLEAR (*)    All other components within normal limits     EKG EKG independently reviewed interpreted by myself (ER attending) demonstrates:    RADIOLOGY Imaging independently reviewed and interpreted by myself  demonstrates:  CT head without acute bleed or other acute abnormality  PROCEDURES:  Critical Care performed: No  Procedures   MEDICATIONS ORDERED IN ED: Medications - No data to display   IMPRESSION / MDM / ASSESSMENT AND PLAN / ED COURSE  I reviewed the triage vital signs and the nursing notes.  Differential diagnosis includes, but is not limited to, behavioral outburst due to primary psychiatric process, UTI, anemia, electrolyte abnormality, acute intracranial process  Patient's presentation is most consistent with acute presentation with potential threat to life or bodily function.  82 year old female presenting with agitation.  Not acutely agitated on exam here, but per nursing team, patient cannot return back to her facility.  Her medical workup is without significant derangement.  Head CT without acute findings.  Do think patient can be medically cleared at this time.  However, in the setting of acute  agitation will consult psychiatry and TTS for further recommendations.  Do not feel there is an indication for IVC.  The patient has been placed in psychiatric observation due to the need to provide a safe environment for the patient while obtaining psychiatric consultation and evaluation, as well as ongoing medical and medication management to treat the patient's condition.  The patient has not been placed under full IVC at this time.     FINAL CLINICAL IMPRESSION(S) / ED DIAGNOSES   Final diagnoses:  Agitation     Rx / DC Orders   ED Discharge Orders     None        Note:  This document was prepared using Dragon voice recognition software and may include unintentional dictation errors.   Trinna Post, MD 09/04/23 [phone]

## 2023-09-04 NOTE — ED Provider Triage Note (Signed)
Emergency Medicine Provider Triage Evaluation Note  Tracy Dickerson , a 82 y.o. female  was evaluated in triage.  Pt complains of agitation, discharged yesterday and went to [facility] and staff reported bed bugs and took her clothing, which agitated patient. Staff reports she was throwing things. Patient has no complaints.  Review of Systems  Positive: agitated Negative: Fever, pain  Physical Exam  BP (!) 126/58 (BP Location: Left Arm)   Pulse 74   Temp 97.6 F (36.4 C) (Oral)   Resp 18   Ht 5\' 6"  (1.676 m)   Wt 90.7 kg   SpO2 97%   BMI 32.28 kg/m  Gen:   Awake, no distress   Resp:  Normal effort  MSK:   Moves extremities without difficulty  Other:    Medical Decision Making  Medically screening exam initiated at 10:43 AM.  Appropriate orders placed.  Tracy Dickerson was informed that the remainder of the evaluation will be completed by another provider, this initial triage assessment does not replace that evaluation, and the importance of remaining in the ED until their evaluation is complete.     Jackelyn Hoehn, PA-C 09/04/23 1045

## 2023-09-05 MED ORDER — HALOPERIDOL LACTATE 5 MG/ML IJ SOLN
5.0000 mg | Freq: Once | INTRAMUSCULAR | Status: AC
Start: 2023-09-05 — End: 2023-09-05

## 2023-09-05 MED ORDER — HALOPERIDOL LACTATE 5 MG/ML IJ SOLN
INTRAMUSCULAR | Status: AC
  Administered 2023-09-05: 5 mg via INTRAMUSCULAR
  Filled 2023-09-05: qty 1

## 2023-09-05 NOTE — ED Notes (Signed)
Pt given breakfast tray and beverage.  

## 2023-09-05 NOTE — TOC Initial Note (Addendum)
Transition of Care [facility]) - Initial/Assessment Note    Patient Details  Name: Tracy Dickerson MRN: [id number] Date of Birth: 1941-01-12  Transition of Care [facility]) CM/SW Contact:    Marquita Palms, LCSW Phone Number: 09/05/2023, 8:37 AM  Clinical Narrative:              CSW called [facility] because this patient is being followed by the department. CSW received phone call from Nia with [facility] to discuss placement. Nia reported she will reach out to the facility to discuss issue with patient coming back. She reports that they can purchased her new clothing but they were not aware she was discharged from the hospital. Awaiting medical workup.       Patient Goals and CMS Choice    Patient needs placement in LTC facility.        Expected Discharge Plan and Services                                             Prior Living Arrangements/Services                       Activities of Daily Living      Permission Sought/Granted                  Emotional Assessment              Admission diagnosis:  Agitated, sent from facility EMS Patient Active Problem List   Diagnosis Date Noted   Low serum cortisol level 09/02/2023   Headache 08/30/2023   Vitamin D deficiency 08/28/2023   Dizziness 08/25/2023   Iron deficiency anemia 08/23/2023   Iron deficiency anemia due to chronic blood loss 08/21/2023   Syncope 08/20/2023   Normocytic anemia 08/20/2023   Obesity (BMI 30-39.9) 08/20/2023   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/2023   Vitamin B12 deficiency 06/20/2023   Carotid stenosis 06/20/2023   Dementia (HCC) 06/19/2023   Generalized weakness 06/18/2023   Acute on chronic anemia 06/18/2023   H/O: upper GI bleed 06/18/2023   Chronic anticoagulation 06/18/2023   Infestation by bed bug 06/18/2023   CKD stage 3a, GFR 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/min (HCC) 01/07/2023   Overweight (BMI 25.0-29.9) 01/06/2023   Type 2 diabetes mellitus with  hyperlipidemia (HCC) 01/06/2023   COPD (chronic obstructive pulmonary disease) (HCC) 01/06/2023   Hypokalemia 01/06/2023   UGIB (upper gastrointestinal bleed) 01/05/2023   Unsatisfactory living conditions 01/05/2023   Constipation    UTI (urinary tract infection) 08/13/2020   HLD (hyperlipidemia) 08/13/2020   Acute renal failure superimposed on stage 3a chronic kidney disease (HCC) 08/13/2020   PAF (paroxysmal atrial fibrillation) (HCC) 08/13/2020   HTN (hypertension) 08/13/2020   Dehydration    Dehydration, moderate    Acute renal failure (HCC) 04/22/2020   Hyperkalemia    Septic shock (HCC)    Sepsis due to urinary tract infection (HCC) 03/13/2018   Acute kidney injury superimposed on chronic kidney disease (HCC) 02/13/2018   Closed extra-articular fracture of distal tibia 02/02/2018   Community acquired pneumonia 10/29/2017   Orthostatic hypotension 01/26/2017   Lung nodule 05/30/2014   Sleep apnea 05/10/2014   Barrett's esophagus 05/13/2007   Malignant neoplasm of breast (female) (HCC) 06/01/1997   PCP:  Dwana Melena, PA Pharmacy:   [facility] - [place], [place] - [address] [place] [address] Phone: [phone] Fax: [phone]  [facility] - [place], [place] - [address] [place] [address] Phone: [phone] Fax: [phone]  [facility] #[id number] [place], [place] - [address] AT [facility] OF [address] [place] [address] Phone: [phone] Fax: [phone]  [id number] - [place], [place] - [address] AT [facility] [address] [facility] [address] [place] [address] Phone: [phone] Fax: [phone]  [facility] #[id number] [place], [place] - [address] AT [facility] OF [address] [place] [address] [place] [address] Phone: [phone] Fax: [phone]     Social Drivers of Health  (SDOH) Social History: SDOH Screenings   Food Insecurity: No Food Insecurity (08/21/2023)  Housing: Medium Risk (08/21/2023)  Transportation Needs: No Transportation Needs (08/21/2023)  Utilities: Not At Risk (08/21/2023)  Financial Resource Strain: Low Risk  (02/04/2023)   Received from [facility], [facility]  Physical Activity: Inactive (07/16/2021)   Received from [facility], [facility]  Stress: No Stress Concern Present (07/16/2021)   Received from [facility], [facility]  Tobacco Use: Medium Risk (09/04/2023)  Health Literacy: Medium Risk (07/16/2021)   Received from [facility], [facility]   SDOH Interventions:     Readmission Risk Interventions     No data to display

## 2023-09-05 NOTE — ED Notes (Signed)
Pt dinner tray was given

## 2023-09-05 NOTE — ED Notes (Signed)
Pt's brother at bedside visiting. Pt conversing with him and remains pleasant and calm with baseline confusion.

## 2023-09-05 NOTE — ED Notes (Signed)
Pt observed ambulatory to bathroom and back to bed with steady gait. Standby assist with Sherilyn Cooter, RN for safety.

## 2023-09-05 NOTE — ED Notes (Signed)
VOL/ TOC 

## 2023-09-05 NOTE — ED Notes (Signed)
Pt requesting to use the bathroom and is ambulatory with steady gait with standby assist by this writer.

## 2023-09-05 NOTE — ED Notes (Signed)
Assisted pt to and from the bathroom. Ambulatory independently. Gait steady

## 2023-09-06 MED ORDER — ATORVASTATIN CALCIUM 20 MG PO TABS
20.0000 mg | ORAL_TABLET | Freq: Every day | ORAL | Status: DC
  Administered 2023-09-06 – 2023-12-31 (×114): 20 mg via ORAL
  Filled 2023-09-06 (×77): qty 1
  Filled 2023-09-06: qty 2
  Filled 2023-09-06 (×15): qty 1
  Filled 2023-09-06: qty 2
  Filled 2023-09-06 (×21): qty 1

## 2023-09-06 MED ORDER — APIXABAN 5 MG PO TABS
5.0000 mg | ORAL_TABLET | Freq: Two times a day (BID) | ORAL | Status: DC
  Administered 2023-09-06 – 2023-12-31 (×230): 5 mg via ORAL
  Filled 2023-09-06 (×231): qty 1

## 2023-09-06 MED ORDER — MIDODRINE HCL 5 MG PO TABS
5.0000 mg | ORAL_TABLET | Freq: Three times a day (TID) | ORAL | Status: DC
  Administered 2023-09-07 – 2023-09-10 (×8): 5 mg via ORAL
  Filled 2023-09-06 (×9): qty 1

## 2023-09-06 MED ORDER — ACETAMINOPHEN 500 MG PO TABS
1000.0000 mg | ORAL_TABLET | Freq: Four times a day (QID) | ORAL | Status: DC | PRN
  Administered 2023-09-08 – 2023-11-03 (×33): 1000 mg via ORAL
  Filled 2023-09-06 (×37): qty 2

## 2023-09-06 MED ORDER — HYDROCORTISONE 5 MG PO TABS
5.0000 mg | ORAL_TABLET | Freq: Every day | ORAL | Status: DC
  Administered 2023-09-07 – 2023-12-31 (×113): 5 mg via ORAL
  Filled 2023-09-06 (×121): qty 1

## 2023-09-06 MED ORDER — ONDANSETRON 4 MG PO TBDP
4.0000 mg | ORAL_TABLET | Freq: Three times a day (TID) | ORAL | Status: DC | PRN
  Administered 2023-10-18 – 2023-10-31 (×3): 4 mg via ORAL
  Filled 2023-09-06 (×3): qty 1

## 2023-09-06 MED ORDER — GABAPENTIN 300 MG PO CAPS
600.0000 mg | ORAL_CAPSULE | Freq: Every day | ORAL | Status: DC
  Administered 2023-09-06 – 2023-12-30 (×117): 600 mg via ORAL
  Filled 2023-09-06 (×117): qty 2

## 2023-09-06 MED ORDER — PANTOPRAZOLE SODIUM 40 MG PO TBEC
40.0000 mg | DELAYED_RELEASE_TABLET | Freq: Two times a day (BID) | ORAL | Status: DC
  Administered 2023-09-06 – 2023-12-31 (×231): 40 mg via ORAL
  Filled 2023-09-06 (×233): qty 1

## 2023-09-06 MED ORDER — ACETAMINOPHEN 500 MG PO TABS
500.0000 mg | ORAL_TABLET | Freq: Four times a day (QID) | ORAL | Status: DC | PRN
  Administered 2023-09-09 – 2023-11-10 (×13): 500 mg via ORAL
  Filled 2023-09-06 (×11): qty 1

## 2023-09-06 MED ORDER — HYDROXYZINE HCL 10 MG PO TABS: 10.0000 mg | ORAL_TABLET | Freq: Three times a day (TID) | ORAL | Status: DC | PRN

## 2023-09-06 MED ORDER — ALBUTEROL SULFATE HFA 108 (90 BASE) MCG/ACT IN AERS: 2.0000 | INHALATION_SPRAY | RESPIRATORY_TRACT | Status: DC | PRN

## 2023-09-06 MED ORDER — BISACODYL 10 MG RE SUPP
10.0000 mg | Freq: Every day | RECTAL | Status: DC | PRN
  Administered 2023-10-24: 10 mg via RECTAL
  Filled 2023-09-06 (×2): qty 1

## 2023-09-06 NOTE — ED Notes (Signed)
Patient continues to come out of room.  Requesting to "go to the [facility] because they took my clothes."  Patient able to be redirect back to room.  Remains irritable.

## 2023-09-06 NOTE — ED Notes (Signed)
Patient is vol pending TOC placement 

## 2023-09-06 NOTE — ED Notes (Signed)
Pt was yelling, "I got a headache." This tech informed the patient that as soon as the doctor is done with another pt, the nurse will ask him to order something to give her for headache. Pt got aggravated, "forget it. If he doesn't want to give me something for headache. Forget it." This tech informed pt again it will be a few minute but she will get something as soon as they can. Patient replied, "Just forget it. Just forget it." Pt got up went to the restroom. Pt is back in room. Another pillow was provided. Trash removed from room.

## 2023-09-06 NOTE — ED Notes (Signed)
Hospital meal provided, pt tolerated w/o complaints.  Waste discarded appropriately.  

## 2023-09-06 NOTE — ED Provider Notes (Signed)
-----------------------------------------   5:55 AM on 09/06/2023 -----------------------------------------   Blood pressure (!) 160/62, pulse 87, temperature 98.2 F (36.8 C), temperature source Oral, resp. rate 18, height 5\' 6"  (1.676 m), weight 90.7 kg, SpO2 96%.  The patient is calm and cooperative at this time.  There have been no acute events since the last update.  Awaiting disposition plan from Social Work team.   Irean Hong, MD 09/06/23 [phone]

## 2023-09-06 NOTE — ED Notes (Signed)
Pt received breakfast tray 

## 2023-09-06 NOTE — ED Notes (Signed)
This tech obtained vital signs on pt and pt received snack. No other need a this time.

## 2023-09-07 NOTE — ED Notes (Signed)
VOL/TOC pending placement

## 2023-09-07 NOTE — ED Notes (Signed)
Pt assisted to toilet in room, pt ambulated with minimal assistance, pt back into bed, TV turned on, denies other needs

## 2023-09-07 NOTE — ED Notes (Signed)
Pt asleep, PM snack to be offered once pt awakens.

## 2023-09-08 NOTE — ED Notes (Signed)
Pt's brother to bedside.

## 2023-09-08 NOTE — ED Provider Notes (Signed)
Emergency Medicine Observation Re-evaluation Note  Tracy Dickerson is a 82 y.o. female, seen on rounds today.  Pt initially presented to the ED for complaints of Agitation  Currently, the patient is calm, no acute complaints.  Physical Exam  Blood pressure 130/71, pulse 72, temperature 98.3 F (36.8 C), temperature source Oral, resp. rate 18, height 5\' 6"  (1.676 m), weight 90.7 kg, SpO2 97%. Physical Exam General: NAD Lungs: CTAB Psych: not agitated  ED Course / MDM  EKG:    I have reviewed the labs performed to date as well as medications administered while in observation.  Recent changes in the last 24 hours include no acute events overnight.    Plan  Current plan is for TOC placement.   Sharman Cheek, MD 09/08/23 [phone]

## 2023-09-08 NOTE — ED Notes (Signed)
Pt. Up to bathroom x1 assist. Pt. Gait slow and mildly unsteady. Pt. Urinated and was assisted back to bed, NAD. Siderail up, bed locke and low, call light in reach. Pt. Performed own pericare at toilet.

## 2023-09-08 NOTE — ED Notes (Signed)
This RN to bedside, first contact with pt. Pt. Is sitting in bed, repositioned for comfort. Denies needs at this time, denies toileting needs at this time. Call light and personal items in reach, bed locked and low. Bed alarm on, fall wrist band in place. Breakfast delivered to pt. Items opened for ease of consumption. Pt's side rail lowered to facilitate reaching food. Pt. Verbalizes understanding to call if she needs assistance. Pt. Cutting and consuming breakfast. NAD.

## 2023-09-08 NOTE — ED Notes (Signed)
Pt is sitting up in bed watching television.She was given ginger ale. Pt does not have any complaints.

## 2023-09-09 NOTE — ED Notes (Signed)
RN attempted to return phone call to Robert [phone], no answer.

## 2023-09-09 NOTE — ED Notes (Signed)
VOL/TOC pending placement

## 2023-09-09 NOTE — TOC Progression Note (Signed)
Transition of Care [facility]) - Progression Note    Patient Details  Name: Tracy Dickerson MRN: [id number] Date of Birth: 1940/12/21  Transition of Care [facility]) CM/SW Contact  Marquita Palms, LCSW Phone Number: 09/09/2023, 2:41 PM  Clinical Narrative:     CSW began placement for patient. Patient information sent to SNF to help with placement.   Expected Discharge Plan: Long Term Nursing Home Barriers to Discharge: Continued Medical Work up  Expected Discharge Plan and Services In-house Referral: Clinical Social Work Discharge Planning Services: NA Post Acute Care Choice: Skilled Nursing Facility Living arrangements for the past 2 months: Single Family Home (Unsafe to return)                   DME Agency: NA     Representative spoke with at DME Agency: N/A HH Arranged: NA           Social Determinants of Health (SDOH) Interventions SDOH Screenings   Food Insecurity: No Food Insecurity (08/21/2023)  Housing: Medium Risk (08/21/2023)  Transportation Needs: No Transportation Needs (08/21/2023)  Utilities: Not At Risk (08/21/2023)  Financial Resource Strain: Low Risk  (02/04/2023)   [facility] from [facility], [facility]  Physical Activity: Inactive (07/16/2021)   Received from [facility], [facility]  Stress: No Stress Concern Present (07/16/2021)   Received from [facility], [facility]  Tobacco Use: Medium Risk (09/04/2023)  Health Literacy: Medium Risk (07/16/2021)   Received from [facility], [facility]    Readmission Risk Interventions     No data to display

## 2023-09-09 NOTE — ED Notes (Signed)
Pt given breakfast tray and beverage.  

## 2023-09-09 NOTE — ED Notes (Signed)
Report received from Amy RN  

## 2023-09-09 NOTE — ED Provider Notes (Signed)
-----------------------------------------   7:20 AM on 09/09/2023 -----------------------------------------   Blood pressure 130/66, pulse 77, temperature 97.9 F (36.6 C), temperature source Oral, resp. rate 20, height 5\' 6"  (1.676 m), weight 90.7 kg, SpO2 97%.  The patient is calm and cooperative at this time.  There have been no acute events since the last update.  Awaiting disposition plan from Social Work team.   Irean Hong, MD 09/09/23 [phone]

## 2023-09-09 NOTE — ED Notes (Signed)
Pt given a cup of ice water per request.

## 2023-09-10 NOTE — ED Provider Notes (Signed)
-----------------------------------------   5:49 AM on 09/10/2023 -----------------------------------------   Blood pressure 138/73, pulse 61, temperature 98.3 F (36.8 C), temperature source Oral, resp. rate 17, height 5\' 6"  (1.676 m), weight 90.7 kg, SpO2 97%.  The patient is calm and cooperative at this time.  There have been no acute events since the last update.  Awaiting disposition plan from Social Work team.   Irean Hong, MD 09/10/23 [phone]

## 2023-09-10 NOTE — ED Notes (Signed)
This tech obtained vital signs on pt.  

## 2023-09-10 NOTE — ED Notes (Signed)
 VOL/TOC Placement

## 2023-09-10 NOTE — ED Notes (Signed)
Pt assisted out of bed to use restroom. Pt steady while using walker. Pt back in bed at this time with no other needs at this time.

## 2023-09-10 NOTE — ED Notes (Signed)
Hospital meal provided.  100% consumed, pt tolerated w/o complaints.  Waste discarded appropriately.   

## 2023-09-10 NOTE — ED Provider Notes (Signed)
Contacted by pharmacist Rockwell Alexandria regarding the patient's midodrine to use.  She has had multiple elevated blood pressure readings without hypotension.  I did review her discharge summary from 12/11.  At that time, patient was started on midodrine, but discharge summary reports that this can be discontinued as needed as blood pressure improves.  Based on this information, midodrine was discontinued.   Trinna Post, MD 09/10/23 [phone]

## 2023-09-11 NOTE — ED Notes (Signed)
Pt ambulated to the restroom with minimal assistance from staff. Pt tolerated activity well.

## 2023-09-11 NOTE — ED Notes (Signed)
Pt received hospital provided lunch tray

## 2023-09-11 NOTE — TOC Progression Note (Signed)
Transition of Care [facility]) - Progression Note    Patient Details  Name: Tracy Dickerson MRN: [id number] Date of Birth: 1940-09-26  [facility]) CM/SW Contact  Marquita Palms, LCSW Phone Number: 09/11/2023, 10:36 AM  Clinical Narrative:     Awaiting authorization, phone call to [facility] with no updates.  Expected Discharge Plan: Long Term Nursing Home Barriers to Discharge: Continued Medical Work up  Expected Discharge Plan and Services In-house Referral: Clinical Social Work Discharge Planning Services: NA Post Acute Care Choice: Skilled Nursing Facility Living arrangements for the past 2 months: Single Family Home (Unsafe to return)                   DME Agency: NA     Representative spoke with at DME Agency: N/A HH Arranged: NA           Social Determinants of Health (SDOH) Interventions SDOH Screenings   Food Insecurity: No Food Insecurity (08/21/2023)  Housing: Medium Risk (08/21/2023)  Transportation Needs: No Transportation Needs (08/21/2023)  Utilities: Not At Risk (08/21/2023)  Financial Resource Strain: Low Risk  (02/04/2023)   Received from [facility], [facility]  Physical Activity: Inactive (07/16/2021)   Received from [facility], [facility]  Stress: No Stress Concern Present (07/16/2021)   Received from [facility], [facility]  Tobacco Use: Medium Risk (09/04/2023)  Health Literacy: Medium Risk (07/16/2021)   Received from [facility], [facility]    Readmission Risk Interventions     No data to display

## 2023-09-11 NOTE — Evaluation (Signed)
Occupational Therapy Evaluation Patient Details Name: Tracy Dickerson MRN: [id number] DOB: 01/12/1941 Today's Date: 09/11/2023   History of Present Illness Pt to ED via [facility] from [facility]. Pt was discharged from the hospital day before. Staff reports pt had bed bugs and that they took her clothes to wash them. Pt became combative because staff took her clothing and are keeping her in her room. Staff informed EMS that pt has been throwing chairs and her walker at them. Per staff at [facility] pt becomes very upset and starts yelling if bed bugs are mentioned.   Clinical Impression   Pt was seen for OT evaluation this date. Pt with recent hospitalization and DC to SNF for rehab, however prior to that she was living at home with her brother and IND with mobility using SPC indoors and rollator for longer distances. Brother assisted with IADLs.   Pt presents to acute OT demonstrating impaired ADL performance and functional mobility 2/2 weakness, mild balance deficits (See OT problem list for additional functional deficits). She denies pain. Pt currently requires Min A for bed mobility from high gurney. SUP for STS from high gurney and CGA for RW mobility ~65 feet to the bathroom and back. CGA for toilet transfer using grab bars and CGA in standing for posterior hygiene after BM. No LOB noted during gait. OT to follow acutely to ensure no functional decline/further weakness. Do anticipate the need for follow up OT services upon acute hospital DC.        If plan is discharge home, recommend the following: A little help with walking and/or transfers;A lot of help with bathing/dressing/bathroom;Assistance with cooking/housework;Assist for transportation;Help with stairs or ramp for entrance    Functional Status Assessment  Patient has had a recent decline in their functional status and demonstrates the ability to make significant improvements in function in a reasonable and predictable amount of  time.  Equipment Recommendations  Other (comment) (defer)    Recommendations for Other Services       Precautions / Restrictions Precautions Precautions: Fall Precaution Comments: patient with prior history of bed bugs Restrictions Weight Bearing Restrictions Per Provider Order: No      Mobility Bed Mobility Overal bed mobility: Needs Assistance Bed Mobility: Supine to Sit, Sit to Supine     Supine to sit: Min assist Sit to supine: Min assist   General bed mobility comments: from high gurney with no bed rail needed Min A to bring trunk upright    Transfers Overall transfer level: Needs assistance Equipment used: Rolling walker (2 wheels) Transfers: Sit to/from Stand Sit to Stand: Supervision           General transfer comment: SUP for STS from high gurney; CGA from toilet in ED using grab bars      Balance Overall balance assessment: Modified Independent Sitting-balance support: Feet supported Sitting balance-Leahy Scale: Good     Standing balance support: Bilateral upper extremity supported, During functional activity, Reliant on assistive device for balance Standing balance-Leahy Scale: Fair Standing balance comment: used RW and CGA/SBA from therapist to ambulate                           ADL either performed or assessed with clinical judgement   ADL Overall ADL's : Needs assistance/impaired     Grooming: Wash/dry hands;Contact guard assist;Standing;Supervision/safety                   Toilet Transfer: Contact  guard assist;Grab bars;Rolling walker (2 wheels);Regular Toilet   Toileting- Clothing Manipulation and Hygiene: Contact guard assist;Sit to/from stand Toileting - Clothing Manipulation Details (indicate cue type and reason): following BM for posterior hygiene     Functional mobility during ADLs: Contact guard assist;Rolling walker (2 wheels)       Vision         Perception         Praxis         Pertinent  Vitals/Pain Pain Assessment Pain Assessment: No/denies pain Pain Intervention(s): Monitored during session     Extremity/Trunk Assessment Upper Extremity Assessment Upper Extremity Assessment: Overall WFL for tasks assessed   Lower Extremity Assessment Lower Extremity Assessment: Overall WFL for tasks assessed   Cervical / Trunk Assessment Cervical / Trunk Assessment: Normal   Communication Communication Communication: No apparent difficulties Cueing Techniques: Verbal cues   Cognition Arousal: Alert Behavior During Therapy: WFL for tasks assessed/performed Overall Cognitive Status: History of cognitive impairments - at baseline                                 General Comments: oriented to person only     General Comments       Exercises Other Exercises Other Exercises: Edu in role of OT and importance of therapy to maximize strength and safety.   Shoulder Instructions      Home Living Family/patient expects to be discharged to:: Skilled nursing facility Living Arrangements: Other relatives Available Help at Discharge: Family;Available 24 hours/day Type of Home: House Home Access: Stairs to enter [employer] of Steps: 3 (from back) Entrance Stairs-Rails: Right Home Layout: One level     Bathroom Shower/Tub: Chief Strategy Officer: Standard     Home Equipment: Cane - single Librarian, academic (2 wheels);Rollator (4 wheels)   Additional Comments: patient was discharged last week to SNF. Was apparently living with brother prior. now looking for placement      Prior Functioning/Environment Prior Level of Function : Independent/Modified Independent             Mobility Comments: Ambulatory with SPC in home; uses rollator outside of home ( 2 weeks ago) ADLs Comments: Independent with ADL's; brother assists with cooking, cleaning, yardwork; uses [employer] for stores and appts (2 weeks ago) patient unable to provide any history  other than she lives with brother        OT Problem List: Decreased strength;Impaired balance (sitting and/or standing);Decreased safety awareness;Decreased knowledge of use of DME or AE      OT Treatment/Interventions: Self-care/ADL training;Therapeutic exercise;Therapeutic activities;DME and/or AE instruction;Patient/family education;Balance training    OT Goals(Current goals can be found in the care plan section) Acute Rehab OT Goals Patient Stated Goal: improve safety/IND OT Goal Formulation: With patient Time For Goal Achievement: 09/25/23 Potential to Achieve Goals: Good ADL Goals Pt Will Perform Lower Body Bathing: with supervision;sitting/lateral leans;sit to/from stand Pt Will Perform Lower Body Dressing: with modified independence;sitting/lateral leans;sit to/from stand Pt Will Transfer to Toilet: with modified independence;ambulating;regular height toilet Pt Will Perform Toileting - Clothing Manipulation and hygiene: with modified independence;sit to/from stand;sitting/lateral leans  OT Frequency: Min 1X/week    Co-evaluation              AM-PAC OT "6 Clicks" Daily Activity     Outcome Measure Help from another person eating meals?: None Help from another person taking care of personal grooming?: None Help from another  person toileting, which includes using toliet, bedpan, or urinal?: A Little Help from another person bathing (including washing, rinsing, drying)?: A Little Help from another person to put on and taking off regular upper body clothing?: None Help from another person to put on and taking off regular lower body clothing?: A Little 6 Click Score: 21   End of Session Equipment Utilized During Treatment: Rolling walker (2 wheels)  Activity Tolerance: Patient tolerated treatment well Patient left: in bed;with call bell/phone within reach  OT Visit Diagnosis: Other abnormalities of gait and mobility (R26.89)                Time: 1513-1530 OT Time  Calculation (min): 17 min Charges:  OT General Charges $OT Visit: 1 Visit OT Evaluation $OT Eval Low Complexity: 1 Low Vanessia Bokhari, OTR/L 09/11/23, 3:56 PM  Khaliq Turay E Tavis Kring 09/11/2023, 3:51 PM

## 2023-09-11 NOTE — ED Notes (Signed)
Pt given breakfast tray

## 2023-09-11 NOTE — ED Notes (Signed)
Pt sitting in bed, eating breakfast. Pt tolerating well.

## 2023-09-11 NOTE — ED Notes (Signed)
PT at bedside, working with pt.

## 2023-09-11 NOTE — ED Notes (Signed)
Pt ambulatory to and from the bathroom with walker.

## 2023-09-11 NOTE — ED Provider Notes (Signed)
Emergency Medicine Observation Re-evaluation Note  Physical Exam   BP (!) 111/55 (BP Location: Left Arm)   Pulse 73   Temp (!) 97.5 F (36.4 C) (Oral)   Resp 18   Ht 5\' 6"  (1.676 m)   Wt 90.7 kg   SpO2 93%   BMI 32.28 kg/m   Patient appears in no acute distress.  ED Course / MDM   No reported events during my shift at the time of this note.   Pt is awaiting dispo from consultants   Pilar Jarvis MD    Pilar Jarvis, MD 09/11/23 [phone]

## 2023-09-11 NOTE — ED Notes (Signed)
VOL/ TOC 

## 2023-09-11 NOTE — Evaluation (Signed)
Physical Therapy Evaluation Patient Details Name: Tracy Dickerson MRN: [id number] DOB: [birth year] Today's Date: 09/11/2023  History of Present Illness  Pt to ED via [facility] from [facility]. Pt was discharged from the hospital day before. Staff reports pt had bed bugs and that they took her clothes to wash them. Pt became combative because staff took her clothing and are keeping her in her room. Staff informed EMS that pt has been throwing chairs and her walker at them. Per staff at [facility] pt becomes very upset and starts yelling if bed bugs are mentioned.   Clinical Impression  Patient received sleeping in hallway on stretcher in ED. She wakes easily to touch, she is pleasant. Oriented to self only. She requires min A to exit and enter bed from high stretcher. Supervision for transfers. Ambulated 100 feet with RW and cga to supervision. Patient requires cues for safety awareness due to decreased cognition. She will continue to benefit from skilled PT to improve safety and independence.        If plan is discharge home, recommend the following: A little help with walking and/or transfers;A little help with bathing/dressing/bathroom;Assistance with cooking/housework;Assist for transportation;Help with stairs or ramp for entrance   Can travel by private vehicle   Yes    Equipment Recommendations Rolling walker (2 wheels);BSC/3in1  Recommendations for Other Services       Functional Status Assessment Patient has had a recent decline in their functional status and demonstrates the ability to make significant improvements in function in a reasonable and predictable amount of time.     Precautions / Restrictions Precautions Precaution Comments: patient with prior history of bed bugs Restrictions Weight Bearing Restrictions Per Provider Order: No      Mobility  Bed Mobility Overal bed mobility: Needs Assistance Bed Mobility: Supine to Sit, Sit to Supine     Supine to sit: Min  assist Sit to supine: Min assist   General bed mobility comments: On stretcher in hallway in ED    Transfers Overall transfer level: Needs assistance Equipment used: Rolling walker (2 wheels) Transfers: Sit to/from Stand Sit to Stand: Supervision           General transfer comment: supervision for sit to stand from stretcher and toilet in ED    Ambulation/Gait Ambulation/Gait assistance: Supervision Gait Distance (Feet): 100 Feet Assistive device: Rolling walker (2 wheels) Gait Pattern/deviations: Step-through pattern, Decreased step length - right, Decreased step length - left Gait velocity: WFL     General Gait Details: cues at times needed for safety with AD  Stairs            Wheelchair Mobility     Tilt Bed    Modified Rankin (Stroke Patients Only)       Balance Overall balance assessment: Modified Independent Sitting-balance support: Feet supported Sitting balance-Leahy Scale: Good     Standing balance support: Bilateral upper extremity supported, During functional activity, Reliant on assistive device for balance Standing balance-Leahy Scale: Good Standing balance comment: patient is able to ambulate a few feet without holding to walker                             Pertinent Vitals/Pain Pain Assessment Breathing: normal Negative Vocalization: none Facial Expression: smiling or inexpressive Body Language: relaxed Consolability: no need to console PAINAD Score: 0 Pain Location: stomach Pain Intervention(s): Monitored during session    Home Living Family/patient expects to be discharged to:: Skilled  nursing facility Living Arrangements: Other relatives Available Help at Discharge: Family;Available 24 hours/day Type of Home: House Home Access: Stairs to enter Entrance Stairs-Rails: Right Entrance Stairs-Number of Steps: 3 (from back)   Home Layout: One level Home Equipment: Cane - single Librarian, academic (2  wheels);Rollator (4 wheels) Additional Comments: patient was discharged last week to SNF. Was apparently living with brother prior. now looking for placement    Prior Function Prior Level of Function : Independent/Modified Independent             Mobility Comments: Ambulatory with SPC in home; uses rollator outside of home ( 2 weeks ago) ADLs Comments: Independent with ADL's; brother assists with cooking, cleaning, yardwork; uses [facility] for stores and appts (2 weeks ago) patient unable to provide any history other than she lives with brother     Extremity/Trunk Assessment   Upper Extremity Assessment Upper Extremity Assessment: Overall WFL for tasks assessed    Lower Extremity Assessment Lower Extremity Assessment: Overall WFL for tasks assessed    Cervical / Trunk Assessment Cervical / Trunk Assessment: Normal  Communication   Communication Communication: No apparent difficulties Cueing Techniques: Verbal cues  Cognition Arousal: Alert Behavior During Therapy: WFL for tasks assessed/performed Overall Cognitive Status: History of cognitive impairments - at baseline                                 General Comments: oriented to person only        General Comments      Exercises     Assessment/Plan    PT Assessment Patient needs continued PT services  PT Problem List Decreased safety awareness;Decreased cognition       PT Treatment Interventions DME instruction;Gait training;Functional mobility training;Therapeutic activities    PT Goals (Current goals can be found in the Care Plan section)  Acute Rehab PT Goals Patient Stated Goal: none stated PT Goal Formulation: With patient Time For Goal Achievement: 09/23/23 Potential to Achieve Goals: Good    Frequency Min 1X/week     Co-evaluation               AM-PAC PT "6 Clicks" Mobility  Outcome Measure Help needed turning from your back to your side while in a flat bed without using  bedrails?: A Little Help needed moving from lying on your back to sitting on the side of a flat bed without using bedrails?: A Little Help needed moving to and from a bed to a chair (including a wheelchair)?: A Little Help needed standing up from a chair using your arms (e.g., wheelchair or bedside chair)?: A Little Help needed to walk in hospital room?: A Little Help needed climbing 3-5 steps with a railing? : A Little 6 Click Score: 18    End of Session   Activity Tolerance: Patient tolerated treatment well Patient left: in bed Nurse Communication: Mobility status PT Visit Diagnosis: Other abnormalities of gait and mobility (R26.89)    Time: 1610-9604 PT Time Calculation (min) (ACUTE ONLY): 12 min   Charges:   PT Evaluation $PT Eval Low Complexity: 1 Low   PT General Charges $$ ACUTE PT VISIT: 1 Visit         Nikkita Adeyemi, PT, GCS 09/11/23,12:07 PM

## 2023-09-12 LAB — CBG MONITORING, ED
Glucose-Capillary: 113 mg/dL — ABNORMAL HIGH (ref 70–99)
Glucose-Capillary: 147 mg/dL — ABNORMAL HIGH (ref 70–99)

## 2023-09-12 NOTE — ED Provider Notes (Signed)
-----------------------------------------   8:43 PM on 09/12/2023 -----------------------------------------   Blood pressure (!) 161/60, pulse 90, temperature 97.9 F (36.6 C), resp. rate 20, height 5\' 6"  (1.676 m), weight 90.7 kg, SpO2 99%.  The patient is calm and cooperative at this time.  There have been no acute events since the last update.  Await[facility] team.   Janith Lima, MD 09/12/23 2043

## 2023-09-12 NOTE — ED Notes (Addendum)
Patient provided snack at appropriate snack time.  Pt consumed 100% of snack provided, tolerated well w/o complaints

## 2023-09-12 NOTE — ED Notes (Signed)
Hospital meal provided, pt tolerated w/o complaints.  Waste discarded appropriately.  

## 2023-09-12 NOTE — ED Provider Notes (Signed)
Emergency Medicine Observation Re-evaluation Note  Physical Exam   BP (!) 141/72   Pulse 70   Temp 97.7 F (36.5 C) (Oral)   Resp 17   Ht 5\' 6"  (1.676 m)   Wt 90.7 kg   SpO2 95%   BMI 32.28 kg/m   Patient appears in no acute distress.  ED Course / MDM   No reported events during my shift at the time of this note.   Pt is awaiting dispo from consultants   Pilar Jarvis MD    Pilar Jarvis, MD 09/12/23 [phone]

## 2023-09-13 LAB — CBG MONITORING, ED: Glucose-Capillary: 144 mg/dL — ABNORMAL HIGH (ref 70–99)

## 2023-09-13 NOTE — ED Notes (Signed)
This NT provided pt with pm snack. 

## 2023-09-13 NOTE — ED Notes (Addendum)
Pt very agitated and requesting Asprin; RN informed of behavior.

## 2023-09-13 NOTE — ED Notes (Signed)
Breakfast tray provided; tolerated well.

## 2023-09-14 LAB — CBG MONITORING, ED
Glucose-Capillary: 113 mg/dL — ABNORMAL HIGH (ref 70–99)
Glucose-Capillary: 96 mg/dL (ref 70–99)
Glucose-Capillary: 148 mg/dL — ABNORMAL HIGH (ref 70–99)

## 2023-09-14 MED ORDER — LIDOCAINE VISCOUS HCL 2 % MT SOLN
15.0000 mL | Freq: Once | OROMUCOSAL | Status: AC
  Administered 2023-09-14: 15 mL via OROMUCOSAL
  Filled 2023-09-14: qty 15

## 2023-09-14 NOTE — ED Notes (Signed)
Pt awake; dinner tray was reheated; Pt tolerated well; waste disposed of properly

## 2023-09-14 NOTE — ED Notes (Signed)
Lunch tray given; Pt tolerated well

## 2023-09-14 NOTE — ED Provider Notes (Signed)
Emergency Medicine Observation Re-evaluation Note  CHARLYE YOFFE is a 82 y.o. female, seen on rounds today.  Pt initially presented to the ED for complaints of Agitation Currently, the patient is resting comfortably in bed.  Physical Exam  BP 129/66 (BP Location: Left Arm)   Pulse 81   Temp 98.2 F (36.8 C) (Oral)   Resp 18   Ht 5\' 6"  (1.676 m)   Wt 90.7 kg   SpO2 97%   BMI 32.28 kg/m  Physical Exam Patient appears well, no acute distress, normal WOB    ED Course / MDM  EKG:   I have reviewed the labs performed to date as well as medications administered while in observation.  Recent changes in the last 24 hours include none.  Plan  Current plan is for dispo per social work.    Chesley Noon, MD 09/14/23 1640

## 2023-09-14 NOTE — ED Notes (Signed)
PT ambulated with walker to bathroom and back.  Pt is back in bed with no other needs at this time.

## 2023-09-14 NOTE — ED Notes (Signed)
Pt given pm snack at this time.

## 2023-09-14 NOTE — ED Notes (Signed)
Dinner tray placed on pt bedside table; Pt is currently napping.

## 2023-09-15 LAB — CBG MONITORING, ED
Glucose-Capillary: 88 mg/dL (ref 70–99)
Glucose-Capillary: 77 mg/dL (ref 70–99)
Glucose-Capillary: 93 mg/dL (ref 70–99)

## 2023-09-15 NOTE — ED Notes (Signed)
VOL  TOC  PLACEMENT 

## 2023-09-15 NOTE — ED Notes (Signed)
Pt set up to each lunch

## 2023-09-15 NOTE — ED Notes (Signed)
Pt set up for breakfast.

## 2023-09-15 NOTE — ED Notes (Signed)
Pt stated she saw her brother come in from the EMS bay.  Pt took walker and started walking away from the quad area. This nurse, Beecher Mcardle and Efraim Kaufmann attempted to redirect pt back to bed. Pt started yelling " I want to see my brother" and proceeded to shove her walker toward [place]. Pt was redirected to bed. NAD

## 2023-09-15 NOTE — ED Notes (Signed)
Pt set up to eat dinner. Pt is calm and cooperative. NAD

## 2023-09-15 NOTE — Progress Notes (Signed)
Physical Therapy Treatment Patient Details Name: Tracy Dickerson MRN: [id number] DOB: 10-Jun-1941 Today's Date: 09/15/2023   History of Present Illness Pt to ED via [facility] from [facility]. Pt was discharged from the hospital day before. Staff reports pt had bed bugs and that they took her clothes to wash them. Pt became combative because staff took her clothing and are keeping her in her room. Staff informed EMS that pt has been throwing chairs and her Lilith Solana at them. Per staff at [facility] pt becomes very upset and starts yelling if bed bugs are mentioned.    PT Comments  Patient supine in bed on arrival and agreeable to PT treatment session. Able to come to sitting with CGA for safety for high stretcher. Ambulated 150' with supervision and use of RW. Pleasantly confused throughout session. Returned to bed with minA. Discharge plan remains appropriate.     If plan is discharge home, recommend the following: A little help with walking and/or transfers;A little help with bathing/dressing/bathroom;Assistance with cooking/housework;Assist for transportation;Help with stairs or ramp for entrance   Can travel by private vehicle     Yes  Equipment Recommendations  Rolling Delainy Mcelhiney (2 wheels);BSC/3in1    Recommendations for Other Services       Precautions / Restrictions Precautions Precautions: Fall Restrictions Weight Bearing Restrictions Per Provider Order: No     Mobility  Bed Mobility Overal bed mobility: Needs Assistance Bed Mobility: Supine to Sit, Sit to Supine     Supine to sit: Contact guard Sit to supine: Min assist   General bed mobility comments: from high ED stretcher with use of bed rail    Transfers Overall transfer level: Needs assistance Equipment used: Rolling Chanler Mendonca (2 wheels) Transfers: Sit to/from Stand Sit to Stand: Supervision                Ambulation/Gait Ambulation/Gait assistance: Supervision Gait Distance (Feet): 150 Feet Assistive  device: Rolling Jaron Czarnecki (2 wheels) Gait Pattern/deviations: Step-through pattern, Decreased step length - right, Decreased step length - left Gait velocity: decreased     General Gait Details: supervision for safety. No LOB noted throughout   Stairs             Wheelchair Mobility     Tilt Bed    Modified Rankin (Stroke Patients Only)       Balance Overall balance assessment: Mild deficits observed, not formally tested                                          Cognition Arousal: Alert Behavior During Therapy: WFL for tasks assessed/performed Overall Cognitive Status: History of cognitive impairments - at baseline                                          Exercises      General Comments        Pertinent Vitals/Pain Pain Assessment Pain Assessment: No/denies pain    Home Living                          Prior Function            PT Goals (current goals can now be found in the care plan section) Acute Rehab PT Goals Patient Stated Goal: none  stated PT Goal Formulation: With patient Time For Goal Achievement: 09/23/23 Potential to Achieve Goals: Good Progress towards PT goals: Progressing toward goals    Frequency    Min 1X/week      PT Plan      Co-evaluation              AM-PAC PT "6 Clicks" Mobility   Outcome Measure  Help needed turning from your back to your side while in a flat bed without using bedrails?: A Little Help needed moving from lying on your back to sitting on the side of a flat bed without using bedrails?: A Little Help needed moving to and from a bed to a chair (including a wheelchair)?: A Little Help needed standing up from a chair using your arms (e.g., wheelchair or bedside chair)?: A Little Help needed to walk in hospital room?: A Little Help needed climbing 3-5 steps with a railing? : A Little 6 Click Score: 18    End of Session   Activity Tolerance: Patient  tolerated treatment well Patient left: in bed Nurse Communication: Mobility status PT Visit Diagnosis: Other abnormalities of gait and mobility (R26.89)     Time: 5784-6962 PT Time Calculation (min) (ACUTE ONLY): 12 min  Charges:    $Therapeutic Activity: 8-22 mins PT General Charges $$ ACUTE PT VISIT: 1 Visit                    Maylon Peppers, PT, DPT Physical Therapist - [facility]  [facility]    Kerryn Tennant A Dajanay Northrup 09/15/2023, 9:23 AM

## 2023-09-15 NOTE — ED Provider Notes (Signed)
-----------------------------------------   6:03 AM on 09/15/2023 -----------------------------------------   Blood pressure (!) 168/71, pulse 69, temperature 98.2 F (36.8 C), temperature source Oral, resp. rate 18, height 5\' 6"  (1.676 m), weight 90.7 kg, SpO2 99%.  The patient is calm and cooperative at this time.  There have been no acute events since the last update.  Awaiting disposition plan from Social Work team.   Irean Hong, MD 09/15/23 [phone]

## 2023-09-15 NOTE — TOC Progression Note (Addendum)
Transition of Care [facility]) - Progression Note    Patient Details  Name: Tracy Dickerson MRN: [id number] Date of Birth: [birth year]  [facility]) CM/SW Contact  Margarito Liner, LCSW Phone Number: 09/15/2023, 8:51 AM  Clinical Narrative: Insurance is offering a peer-to-peer review. CSW sent details to MD. Deadline is 12:00 pm today.    11:53 am: Auth approved: [id number]. According to approval letter, the Berkley Harvey is assigned to [facility]. CSW left admissions coordinator to see if they offered a bed. They are still listed as pending in the hub. CSW also left a voicemail for DSS social worker, [name], to see if they are the decision makers or the patient's brothers.  2:00 pm: [facility] is not able to offer patient a bed because she is too high functioning. CSW sent updated therapy notes to SNF's that had not responded to the referral yet. DSS supervisor [name] confirmed that they are pursuing guardianship but do not yet have authority to make decisions for her.   4:58 pm: CSW updated brother [name]. Expanded SNF search to other counties.  Expected Discharge Plan: Long Term Nursing Home Barriers to Discharge: Continued Medical Work up  Expected Discharge Plan and Services In-house Referral: Clinical Social Work Discharge Planning Services: NA Post Acute Care Choice: Skilled Nursing Facility Living arrangements for the past 2 months: Single Family Home (Unsafe to return)                   DME Agency: NA     Representative spoke with at DME Agency: N/A HH Arranged: NA           Social Determinants of Health (SDOH) Interventions SDOH Screenings   Food Insecurity: No Food Insecurity (08/21/2023)  Housing: Medium Risk (08/21/2023)  Transportation Needs: No Transportation Needs (08/21/2023)  Utilities: Not At Risk (08/21/2023)  Financial Resource Strain: Low Risk  (02/04/2023)   Received from [facility], [facility]  Physical  Activity: Inactive (07/16/2021)   Received from [facility], [facility]  Stress: No Stress Concern Present (07/16/2021)   Received from [facility], [facility]  Tobacco Use: Medium Risk (09/04/2023)  Health Literacy: Medium Risk (07/16/2021)   Received from [facility], [facility]    Readmission Risk Interventions     No data to display

## 2023-09-15 NOTE — ED Notes (Signed)
Brother at bedside. Water given to pt by this nurse per pt request.

## 2023-09-15 NOTE — ED Provider Notes (Addendum)
[employer] Medical Director called and has now [employer] for SNF        Initiated  a peer-to-peer review. Call [phone], option 5.  [phone]. Deadline is 12:00 today.   TOC requested I have a peer to peer. I have called and spoke with 'community' insurance and started peer to peer process with them   Sharyn Creamer, MD 09/15/23 [phone]  Per insurance medical director:  She was noted to be able to ambulate > 100 feet and transferred (too mobile, too functional). Review seems to be more related to daily living and custodial needs. She does NOT meet daily skilled therapy or skilled nursing needs per insurance, as no need is identified that meets criteria. The physician at the company can't give his name or identifier - he is a 'home and community care transitions medical director' under [employer].     Sharyn Creamer, MD 09/15/23 8469    Sharyn Creamer, MD 09/15/23 [phone]

## 2023-09-16 LAB — CBG MONITORING, ED: Glucose-Capillary: 87 mg/dL (ref 70–99)

## 2023-09-16 NOTE — ED Notes (Signed)
Vol toc placement 

## 2023-09-16 NOTE — ED Provider Notes (Signed)
-----------------------------------------   6:49 AM on 09/16/2023 -----------------------------------------   Blood pressure (!) 146/89, pulse 66, temperature 98.6 F (37 C), temperature source Oral, resp. rate 15, height 5\' 6"  (1.676 m), weight 90.7 kg, SpO2 95%.  The patient is calm and cooperative at this time.  There have been no acute events since the last update.  Awaiting disposition plan from case management/social work.    Tyanna Hach, Layla Maw, DO 09/16/23 [phone]

## 2023-09-16 NOTE — TOC Progression Note (Addendum)
Transition of Care [facility]) - Progression Note    Patient Details  Name: ROSELYN VALENSUELA MRN: [id number] Date of Birth: January 08, 1941  Transition of [facility]) CM/SW Contact  Margarito Liner, LCSW Phone Number: 09/16/2023, 8:34 AM  Clinical Narrative:  No SNF bed offers this morning.   3:22 pm: No bed offers. Brother [name] is aware.  Expected Discharge Plan: Long Term Nursing Home Barriers to Discharge: Continued Medical Work up  Expected Discharge Plan and Services In-house Referral: Clinical Social Work Discharge Planning Services: NA Post Acute Care Choice: Skilled Nursing Facility Living arrangements for the past 2 months: Single Family Home (Unsafe to return)                   DME Agency: NA     Representative spoke with at DME Agency: N/A HH Arranged: NA           Social Determinants of Health (SDOH) Interventions SDOH Screenings   Food Insecurity: No Food Insecurity (08/21/2023)  Housing: Medium Risk (08/21/2023)  Transportation Needs: No Transportation Needs (08/21/2023)  Utilities: Not At Risk (08/21/2023)  Financial Resource Strain: Low Risk  (02/04/2023)   Received from [facility], [facility]  Physical Activity: Inactive (07/16/2021)   Received from [facility], [facility]  Stress: No Stress Concern Present (07/16/2021)   Received from [facility], [facility]  Tobacco Use: Medium Risk (09/04/2023)  Health Literacy: Medium Risk (07/16/2021)   Received from [facility], [facility]    Readmission Risk Interventions     No data to display

## 2023-09-16 NOTE — ED Notes (Addendum)
Pt ate her lunch

## 2023-09-16 NOTE — ED Notes (Signed)
Dinner tray provided for pt 

## 2023-09-16 NOTE — ED Notes (Signed)
Pt received dinner. 

## 2023-09-17 LAB — CBG MONITORING, ED
Glucose-Capillary: 69 mg/dL — ABNORMAL LOW (ref 70–99)
Glucose-Capillary: 131 mg/dL — ABNORMAL HIGH (ref 70–99)
Glucose-Capillary: 81 mg/dL (ref 70–99)

## 2023-09-17 NOTE — ED Notes (Signed)
 VOL  PENDING  TOC  PLACEMENT

## 2023-09-17 NOTE — ED Provider Notes (Signed)
-----------------------------------------   6:23 AM on 09/17/2023 -----------------------------------------   Blood pressure (!) 155/58, pulse 66, temperature 98 F (36.7 C), temperature source Oral, resp. rate 17, height 5\' 6"  (1.676 m), weight 90.7 kg, SpO2 92%.  The patient is calm and cooperative at this time.  There have been no acute events since the last update.  Awaiting disposition plan from Social Work team.   Irean Hong, MD 09/17/23 [phone]

## 2023-09-17 NOTE — ED Notes (Signed)
VOl TOC

## 2023-09-18 LAB — CBG MONITORING, ED
Glucose-Capillary: 96 mg/dL (ref 70–99)
Glucose-Capillary: 97 mg/dL (ref 70–99)

## 2023-09-18 NOTE — ED Notes (Signed)
Resumed care from tracey rn   pt sleeping in hallway bed.  No acute distress.

## 2023-09-18 NOTE — ED Notes (Signed)
 VOL/TOC Placement

## 2023-09-18 NOTE — ED Notes (Signed)
Pt has family visiting at bedside

## 2023-09-18 NOTE — Progress Notes (Signed)
Mobility Specialist - Progress Note    09/18/23 1514  Mobility  Activity Ambulated with assistance in hallway;Stood at bedside  Level of Assistance Standby assist, set-up cues, supervision of patient - no hands on  Assistive Device Front wheel walker  Distance Ambulated (ft) 220 ft  Range of Motion/Exercises Active  Activity Response Tolerated well  Mobility Referral Yes  Mobility visit 1 Mobility   Pt resting in bed reading a book upon entry on RA. Pt STS and ambulates to hallway around NS SBA with RW. Pt endorses no pain or SOB. Pt returned to stretcher and left with needs in reach and in view of staff.   Johnathan Hausen Mobility Specialist 09/18/23, 3:25 PM

## 2023-09-18 NOTE — TOC Progression Note (Addendum)
Transition of Care [facility]) - Progression Note    Patient Details  Name: Tracy Dickerson MRN: [id number] Date of Birth: [birth year]  Transition of Care [facility]) CM/SW Contact  Margarito Liner, LCSW Phone Number: 09/18/2023, 8:52 AM  Clinical Narrative:  No bed offers this morning.   3:04 pm: Sent updated therapy notes to facilities that have not responded yet.  Expected Discharge Plan: Long Term Nursing Home Barriers to Discharge: Continued Medical Work up  Expected Discharge Plan and Services In-house Referral: Clinical Social Work Discharge Planning Services: NA Post Acute Care Choice: Skilled Nursing Facility Living arrangements for the past 2 months: Single Family Home (Unsafe to return)                   DME Agency: NA     Representative spoke with at DME Agency: N/A HH Arranged: NA           Social Determinants of Health (SDOH) Interventions SDOH Screenings   Food Insecurity: No Food Insecurity (08/21/2023)  Housing: Medium Risk (08/21/2023)  Transportation Needs: No Transportation Needs (08/21/2023)  Utilities: Not At Risk (08/21/2023)  Financial Resource Strain: Low Risk  (02/04/2023)   Received from [facility], [facility]  Physical Activity: Inactive (07/16/2021)   Received from [facility], [facility]  Stress: No Stress Concern Present (07/16/2021)   Received from [facility], [facility]  Tobacco Use: Medium Risk (09/04/2023)  Health Literacy: Medium Risk (07/16/2021)   Received from [facility], [facility]    Readmission Risk Interventions     No data to display

## 2023-09-18 NOTE — Progress Notes (Signed)
PT Cancellation Note  Patient Details Name: Tracy Dickerson MRN: [id number] DOB: 05-Jan-1941   Cancelled Treatment:     PT attempt. Pt currently working with OT and recently walked with mobility tech. PT will continue to follow per current POC. Will return at a later time/date.    Rushie Chestnut 09/18/2023, 3:29 PM

## 2023-09-18 NOTE — ED Notes (Signed)
Pt provided with breakfast tray. No further complaints at this time.

## 2023-09-18 NOTE — Progress Notes (Signed)
Occupational Therapy Treatment Patient Details Name: Tracy Dickerson MRN: [id number] DOB: 12/23/1940 Today's Date: 09/18/2023   History of present illness Pt to ED via [facility] from [facility]. Pt was discharged from the hospital day before. Staff reports pt had bed bugs and that they took her clothes to wash them. Pt became combative because staff took her clothing and are keeping her in her room. Staff informed EMS that pt has been throwing chairs and her walker at them. Per staff at [facility] pt becomes very upset and starts yelling if bed bugs are mentioned.   OT comments  Pt is supine in bed on arrival. Pleasant and agreeable to OT session. She denies pain. Pt performed bed mobility with CGA from high gurney. Pt required CGA/Sup for STS from EOB and SBA using RW for ambulation in hallway to the bathroom and back in ED. Toilet transfer SBA using grab bar and able to perform LB dressing to change mesh panties and peri-care using wash cloth to clean after BM with CGA/SBA for balance. Pt stood at sink with unilateral support at all times to prevent posterior LOB to perform oral care and hand hygiene with CGA.  Pt returned to bed with all needs in place and will cont to require skilled acute OT services to maximize her safety and IND to return to PLOF.       If plan is discharge home, recommend the following:  A little help with walking and/or transfers;A lot of help with bathing/dressing/bathroom;Assistance with cooking/housework;Assist for transportation;Help with stairs or ramp for entrance   Equipment Recommendations  Other (comment) (defer)    Recommendations for Other Services      Precautions / Restrictions Precautions Precautions: Fall Restrictions Weight Bearing Restrictions Per Provider Order: No       Mobility Bed Mobility Overal bed mobility: Needs Assistance Bed Mobility: Supine to Sit, Sit to Supine     Supine to sit: Contact guard Sit to supine: Contact guard  assist   General bed mobility comments: from high ED stretcher with use of bed rail    Transfers Overall transfer level: Needs assistance Equipment used: Rolling walker (2 wheels) Transfers: Sit to/from Stand Sit to Stand: Supervision           General transfer comment: SUP for STS from high gurney; CGA from toilet in ED using grab bars     Balance Overall balance assessment: Needs assistance Sitting-balance support: Feet supported   Sitting balance - Comments: steady for LB dressing EOB and on toilet   Standing balance support: Bilateral upper extremity supported, During functional activity, Reliant on assistive device for balance Standing balance-Leahy Scale: Fair Standing balance comment: used RW and CGA/SBA from therapist to perform dynamic standing activity                           ADL either performed or assessed with clinical judgement   ADL Overall ADL's : Needs assistance/impaired     Grooming: Wash/dry hands;Contact guard assist;Standing;Supervision/safety;Oral care;Wash/dry face Grooming Details (indicate cue type and reason): CGA/SBA with one hand on sink for balance             Lower Body Dressing: Sit to/from stand;Contact guard assist Lower Body Dressing Details (indicate cue type and reason): CGA to maintain balance in standing to pull mesh panties over hips Toilet Transfer: Contact guard assist;Grab bars;Rolling walker (2 wheels);Regular Toilet   Toileting- Clothing Manipulation and Hygiene: Contact guard assist;Sit to/from stand  Toileting - Clothing Manipulation Details (indicate cue type and reason): following BM for posterior hygiene using wash cloth for peri-care            Extremity/Trunk Assessment              Vision       Perception     Praxis      Cognition Arousal: Alert Behavior During Therapy: WFL for tasks assessed/performed Overall Cognitive Status: History of cognitive impairments - at baseline                                           Exercises      Shoulder Instructions       General Comments      Pertinent Vitals/ Pain       Pain Assessment Pain Assessment: No/denies pain  Home Living                                          Prior Functioning/Environment              Frequency  Min 1X/week        Progress Toward Goals  OT Goals(current goals can now be found in the care plan section)  Progress towards OT goals: Progressing toward goals  Acute Rehab OT Goals Patient Stated Goal: improve safety and strength OT Goal Formulation: With patient Time For Goal Achievement: 09/25/23 Potential to Achieve Goals: Good  Plan      Co-evaluation                 AM-PAC OT "6 Clicks" Daily Activity     Outcome Measure   Help from another person eating meals?: None Help from another person taking care of personal grooming?: None Help from another person toileting, which includes using toliet, bedpan, or urinal?: A Little Help from another person bathing (including washing, rinsing, drying)?: A Little Help from another person to put on and taking off regular upper body clothing?: None Help from another person to put on and taking off regular lower body clothing?: A Little 6 Click Score: 21    End of Session Equipment Utilized During Treatment: Rolling walker (2 wheels)  OT Visit Diagnosis: Other abnormalities of gait and mobility (R26.89)   Activity Tolerance Patient tolerated treatment well   Patient Left in bed;with call bell/phone within reach   Nurse Communication Mobility status        Time: 5621-3086 OT Time Calculation (min): 20 min  Charges: OT General Charges $OT Visit: 1 Visit OT Treatments $Self Care/Home Management : 8-22 mins  Andreya Lacks, OTR/L  09/18/23, 4:17 PM   Kalim Kissel E Kandas Oliveto 09/18/2023, 4:16 PM

## 2023-09-19 LAB — CBG MONITORING, ED: Glucose-Capillary: 88 mg/dL (ref 70–99)

## 2023-09-19 NOTE — ED Notes (Signed)
Lunch tray provided. 

## 2023-09-19 NOTE — ED Notes (Signed)
Pt sitting up in bed, waiting for breakfast, denies any needs.

## 2023-09-19 NOTE — ED Notes (Signed)
Pt up to bathroom with assistance 

## 2023-09-19 NOTE — ED Notes (Signed)
Patient up and to restroom via walker without assistance or incident.

## 2023-09-19 NOTE — ED Notes (Signed)
Pt assisted to bathroom at this time. Using walker.

## 2023-09-19 NOTE — TOC Progression Note (Signed)
Transition of Care [facility]) - Progression Note    Patient Details  Name: Tracy Dickerson MRN: [id number] Date of Birth: June 19, 1941  Transition of Care [facility]) CM/SW Contact  Liliana Cline, LCSW Phone Number: 09/19/2023, 4:16 PM  Clinical Narrative:    No bed offers.   Expected Discharge Plan: Long Term Nursing Home Barriers to Discharge: Continued Medical Work up  Expected Discharge Plan and Services In-house Referral: Clinical Social Work Discharge Planning Services: NA Post Acute Care Choice: Skilled Nursing Facility Living arrangements for the past 2 months: Single Family Home (Unsafe to return)                   DME Agency: NA     Representative spoke with at DME Agency: N/A HH Arranged: NA           Social Determinants of Health (SDOH) Interventions SDOH Screenings   Food Insecurity: No Food Insecurity (08/21/2023)  Housing: Medium Risk (08/21/2023)  Transportation Needs: No Transportation Needs (08/21/2023)  Utilities: Not At Risk (08/21/2023)  Financial Resource Strain: Low Risk  (02/04/2023)   Received from [facility], [facility]  Physical Activity: Inactive (07/16/2021)   Received from [facility], [facility]  Stress: No Stress Concern Present (07/16/2021)   Received from [facility], [facility]  Tobacco Use: Medium Risk (09/04/2023)  Health Literacy: Medium Risk (07/16/2021)   Received from [facility], [facility]    Readmission Risk Interventions     No data to display

## 2023-09-19 NOTE — ED Provider Notes (Signed)
-----------------------------------------   6:25 AM on 09/19/2023 -----------------------------------------   Blood pressure (!) 149/64, pulse 67, temperature 98 F (36.7 C), temperature source Oral, resp. rate 16, height 1.676 m (5\' 6" ), weight 90.7 kg, SpO2 97%.  The patient is calm and cooperative at this time.  There have been no acute events since the last update.  Awaiting disposition plan from [facility] team.   Loleta Rose, MD 09/19/23 [phone]

## 2023-09-19 NOTE — Progress Notes (Signed)
Mobility Specialist - Progress Note     09/19/23 1500  Mobility  Activity Ambulated with assistance in hallway  Level of Assistance Standby assist, set-up cues, supervision of patient - no hands on  Assistive Device Front wheel walker  Distance Ambulated (ft) 220 ft  Range of Motion/Exercises Active  Activity Response Tolerated well  Mobility Referral Yes  Mobility visit 1 Mobility   Pt resting in bed on RA upon entry. Pt STS and ambulates to hallway around NS SBA with RW. Pt expressed no pain, dizziness, or LOB. Pt returned to bed and left with needs in reach.   Tracy Dickerson Mobility Specialist 09/19/23, 4:00 PM

## 2023-09-20 ENCOUNTER — Emergency Department: Payer: 59

## 2023-09-20 NOTE — ED Provider Notes (Signed)
-----------------------------------------   12:53 PM on 09/20/2023 -----------------------------------------   Blood pressure (!) 157/66, pulse (!) 112, temperature 97.8 F (36.6 C), resp. rate 18, height 5\' 6"  (1.676 m), weight 90.7 kg, SpO2 98%.  The patient is calm and cooperative at this time.  There have been no acute events since the last update.  Awaiting disposition plan from case management/social work.  Patient had a fall today while in the shower.  On reevaluation does have a hematoma to the back of her scalp.  CT scan of her head with no acute findings.  Okay to resume Eliquis.  Given Tylenol for pain control.  No other acute injuries.  No midline cervical spine tenderness.    Corena Herter, MD 09/20/23 1253

## 2023-09-20 NOTE — ED Notes (Signed)
Pt was taken to the restroom to shower. Chair was provided for patient to take a shower while sitting down. Dan Humphreys was in the restroom with pt. This tech was constantly checking on pt while showering and advised pt to do not get up until I get back and here to assist her to dress up. When this tech when to go check on pt, seconds before opening the door patient stood up and slipped down. Pt hit her head on the wall. This staff noticed a bump behind her head. Pt is only complaining about headache after heating her head. Not other complains. VS were checked. Pt is back in the bed.

## 2023-09-20 NOTE — ED Notes (Signed)
Snacks given 

## 2023-09-20 NOTE — ED Notes (Signed)
Vol toc placement 

## 2023-09-20 NOTE — ED Notes (Signed)
Patient is vol pending placement 

## 2023-09-20 NOTE — Progress Notes (Signed)
Mobility Specialist - Progress Note     09/20/23 1143  Mobility  Activity Ambulated with assistance in hallway;Stood at bedside  Level of Assistance Standby assist, set-up cues, supervision of patient - no hands on  Assistive Device Front wheel walker  Distance Ambulated (ft) 480 ft  Range of Motion/Exercises Active  Activity Response Tolerated well  Mobility Referral Yes  Mobility visit 1 Mobility  Mobility Specialist Start Time (ACUTE ONLY) 1121  Mobility Specialist Stop Time (ACUTE ONLY) 1144  Mobility Specialist Time Calculation (min) (ACUTE ONLY) 23 min    [employer] Mobility Specialist 09/20/23, 11:45 AM

## 2023-09-20 NOTE — ED Notes (Signed)
Patient to CT from ER.

## 2023-09-21 NOTE — ED Notes (Addendum)
Pt stated she had a shower 12/28 refused to take shower 12/29

## 2023-09-21 NOTE — ED Notes (Signed)
Patient voicing delusions regarding other patients. Agitated and slamming walker in hallway. Requesting to speak with only female staff. Redirected back to room.

## 2023-09-21 NOTE — ED Notes (Signed)
Pt agitated and throwing walker in hall way. Pt walked to bathroom per request and walked back to room. Pt given remote control per request. Pt still agitated.

## 2023-09-21 NOTE — ED Notes (Signed)
Pt noticed a small skin tear on right arm. This tech placed gauze and coband around it to stop it from bleeding. Dawn, RN, made aware.

## 2023-09-21 NOTE — ED Notes (Signed)
Pt ambulated to the restroom and back to bed with this tech following behind for safety. Pt tolerated ambulation well and no other concerns at this time.

## 2023-09-22 NOTE — ED Notes (Signed)
Pt ambulatory (with walker) to hallway bathroom near B side. Pt settled back in bed, side rails up, and bed in the lowest position.

## 2023-09-22 NOTE — Progress Notes (Signed)
Physical Therapy Treatment Patient Details Name: Tracy Dickerson MRN: [id number] DOB: March 26, 1941 Today's Date: 09/22/2023   History of Present Illness Pt to ED via [facility] from [facility]. Pt was discharged from the hospital day before. Staff reports pt had bed bugs and that they took her clothes to wash them. Pt became combative because staff took her clothing and are keeping her in her room. Staff informed EMS that pt has been throwing chairs and her walker at them. Per staff at [facility] pt becomes very upset and starts yelling if bed bugs are mentioned.    PT Comments  On and off stretcher with supervision/cga due to height.  She is able to walk 200+feet in hallway with RW and cga/supervision without difficulty.  Declined further activity at this time.   If plan is discharge home, recommend the following: A little help with walking and/or transfers;A little help with bathing/dressing/bathroom;Assistance with cooking/housework;Assist for transportation;Help with stairs or ramp for entrance   Can travel by private vehicle        Equipment Recommendations  Rolling walker (2 wheels);BSC/3in1    Recommendations for Other Services       Precautions / Restrictions Precautions Precautions: Fall Restrictions Weight Bearing Restrictions Per Provider Order: No     Mobility  Bed Mobility Overal bed mobility: Needs Assistance Bed Mobility: Supine to Sit, Sit to Supine     Supine to sit: Supervision Sit to supine: Supervision   General bed mobility comments: from high ED stretcher with use of bed rail Patient Response: Cooperative  Transfers Overall transfer level: Needs assistance Equipment used: Rolling walker (2 wheels) Transfers: Sit to/from Stand Sit to Stand: Supervision                Ambulation/Gait Ambulation/Gait assistance: Supervision Gait Distance (Feet): 200 Feet Assistive device: Rolling walker (2 wheels)   Gait velocity: decreased     General Gait  Details: supervision for safety. No LOB noted throughout   Stairs             Wheelchair Mobility     Tilt Bed Tilt Bed Patient Response: Cooperative  Modified Rankin (Stroke Patients Only)       Balance Overall balance assessment: Needs assistance Sitting-balance support: Feet unsupported, No upper extremity supported Sitting balance-Leahy Scale: Good     Standing balance support: Bilateral upper extremity supported, During functional activity, Reliant on assistive device for balance Standing balance-Leahy Scale: Fair                              Cognition Arousal: Alert Behavior During Therapy: WFL for tasks assessed/performed Overall Cognitive Status: Within Functional Limits for tasks assessed                                          Exercises      General Comments        Pertinent Vitals/Pain Pain Assessment Pain Assessment: No/denies pain Pain Intervention(s): Monitored during session    Home Living                          Prior Function            PT Goals (current goals can now be found in the care plan section) Progress towards PT goals: Progressing toward goals  Frequency    Min 1X/week      PT Plan      Co-evaluation              AM-PAC PT "6 Clicks" Mobility   Outcome Measure  Help needed turning from your back to your side while in a flat bed without using bedrails?: None Help needed moving from lying on your back to sitting on the side of a flat bed without using bedrails?: None Help needed moving to and from a bed to a chair (including a wheelchair)?: A Little Help needed standing up from a chair using your arms (e.g., wheelchair or bedside chair)?: A Little Help needed to walk in hospital room?: A Little Help needed climbing 3-5 steps with a railing? : A Little 6 Click Score: 20    End of Session Equipment Utilized During Treatment: Gait belt Activity Tolerance:  Patient tolerated treatment well Patient left: in bed Nurse Communication: Mobility status PT Visit Diagnosis: Other abnormalities of gait and mobility (R26.89)     Time: 3086-5784 PT Time Calculation (min) (ACUTE ONLY): 8 min  Charges:    $Gait Training: 8-22 mins PT General Charges $$ ACUTE PT VISIT: 1 Visit                   Danielle Dess, PTA 09/22/23, 12:23 PM

## 2023-09-22 NOTE — ED Notes (Signed)
vol/toc placement.. 

## 2023-09-22 NOTE — ED Notes (Signed)
Pt walked with PT

## 2023-09-22 NOTE — ED Notes (Signed)
Patient given a breakfast tray.

## 2023-09-22 NOTE — ED Notes (Signed)
Report received from [place], EMT-P

## 2023-09-22 NOTE — ED Notes (Signed)
Message sent to pharmacy requesting hydrocortisone be sent to ED.

## 2023-09-22 NOTE — ED Notes (Signed)
Patient refusing to take her medications due to not being able to go upstairs and see her brother. Tried reasoning with the patient without success.

## 2023-09-22 NOTE — ED Notes (Signed)
Pt is now agreeable to taking her medication

## 2023-09-22 NOTE — ED Notes (Signed)
Assumed patient care at approximately 0803 and received report from the previous nurse.

## 2023-09-23 NOTE — ED Provider Notes (Signed)
 Emergency Medicine Observation Re-evaluation Note  Tracy Dickerson is a 82 y.o. female, currently boarding in the emergency department.  No acute events since last update.  Physical Exam  BP (!) 170/81   Pulse 86   Temp 97.7 F (36.5 C) (Oral)   Resp 16   Ht 5' 6 (1.676 m)   Wt 90.7 kg   SpO2 97%   BMI 32.28 kg/m   ED Course / MDM   No recent lab work available for review.  Plan  Current plan is for placement to an appropriate living facility once available.  Social worker is currently working with the patient to achieve this.Tracy Dorothyann Drivers, MD 09/23/23 [phone]

## 2023-09-24 NOTE — ED Notes (Signed)
 Pt resting but [place] lunch tray provided by this tech.

## 2023-09-24 NOTE — ED Notes (Addendum)
 Patient ambulatory to bathroom w/ walker.  Upon getting back to bed, patient denies any complaints or needs at this time.

## 2023-09-24 NOTE — Progress Notes (Signed)
 CSW faxed patient's clinicals to additional facilities in attempt to obtain a bed offer.  Edwin Dada, MSW, LCSW Transitions of Care  Clinical Social Worker II [phone]

## 2023-09-24 NOTE — ED Provider Notes (Signed)
-----------------------------------------   8:51 AM on 09/24/2023 -----------------------------------------   Blood pressure (!) 144/81, pulse 73, temperature 97.7 F (36.5 C), temperature source Oral, resp. rate 19, height 5' 6 (1.676 m), weight 90.7 kg, SpO2 98%.  The patient is calm and cooperative at this time.  There have been no acute events since the last update.  Awaiting disposition plan from case management/social work.  No complaints, ate breakfast    Suzanne Kirsch, MD 09/24/23 [phone]

## 2023-09-24 NOTE — ED Notes (Signed)
 Patient ambulatory to bathroom w/ walker.

## 2023-09-25 NOTE — ED Notes (Signed)
 Lunch tray provided.

## 2023-09-25 NOTE — ED Notes (Signed)
Pt given evening snack.  ?

## 2023-09-25 NOTE — Progress Notes (Addendum)
 Physical Therapy Treatment Patient Details Name: Tracy Dickerson MRN: [id number] DOB: 18-Aug-1941 Today's Date: 09/25/2023   History of Present Illness Pt to ED via [facility] from [facility]. Pt was discharged from the hospital day before. Staff reports pt had bed bugs and that they took her clothes to wash them. Pt became combative because staff took her clothing and are keeping her in her room. Staff informed EMS that pt has been throwing chairs and her walker at them. Per staff at [facility] pt becomes very upset and starts yelling if bed bugs are mentioned.    PT Comments  Pt was asleep upon arrival. She easily awakes and is agreeable to OOB activity. Pt is pleasant throughout session. Author assisted pt with placement of shoes at EOB prior to standing and ambulating > 250 ft with RW. Pt tends to have shuffling gait pattern but no LOB.Acute PT will continue to follow per current POC.     If plan is discharge home, recommend the following: A little help with walking and/or transfers;A little help with bathing/dressing/bathroom;Assistance with cooking/housework;Assist for transportation;Help with stairs or ramp for entrance     Equipment Recommendations  Rolling walker (2 wheels);BSC/3in1       Precautions / Restrictions Precautions Precautions: Fall Restrictions Weight Bearing Restrictions Per Provider Order: No     Mobility  Bed Mobility Overal bed mobility: Needs Assistance Bed Mobility: Supine to Sit, Sit to Supine  Supine to sit: Supervision Sit to supine: Supervision   Transfers Overall transfer level: Needs assistance Equipment used: Rolling walker (2 wheels) Transfers: Sit to/from Stand Sit to Stand: Supervision   Ambulation/Gait Ambulation/Gait assistance: Supervision Gait Distance (Feet): 250 Feet Assistive device: Rolling walker (2 wheels) Gait Pattern/deviations: Step-through pattern, Decreased step length - right, Decreased step length - left, Shuffle Gait  velocity: decreased  General Gait Details: supervision for safety. No LOB noted throughout   Balance Overall balance assessment: Needs assistance Sitting-balance support: Feet unsupported, No upper extremity supported Sitting balance-Leahy Scale: Good     Standing balance support: Bilateral upper extremity supported, During functional activity, Reliant on assistive device for balance Standing balance-Leahy Scale: Fair       Cognition Arousal: Alert Behavior During Therapy: WFL for tasks assessed/performed Overall Cognitive Status: Within Functional Limits for tasks assessed              Pertinent Vitals/Pain Pain Assessment Pain Assessment: No/denies pain Pain Intervention(s): Monitored during session     PT Goals (current goals can now be found in the care plan section) Acute Rehab PT Goals Patient Stated Goal: none stated Progress towards PT goals: Progressing toward goals    Frequency    Min 1X/week       AM-PAC PT 6 Clicks Mobility   Outcome Measure  Help needed turning from your back to your side while in a flat bed without using bedrails?: None Help needed moving from lying on your back to sitting on the side of a flat bed without using bedrails?: None Help needed moving to and from a bed to a chair (including a wheelchair)?: A Little Help needed standing up from a chair using your arms (e.g., wheelchair or bedside chair)?: A Little Help needed to walk in hospital room?: A Lot Help needed climbing 3-5 steps with a railing? : A Lot 6 Click Score: 18    End of Session   Activity Tolerance: Patient tolerated treatment well Patient left: in bed Nurse Communication: Mobility status PT Visit Diagnosis: Other abnormalities of  gait and mobility (R26.89)     Time: 1247-1300 PT Time Calculation (min) (ACUTE ONLY): 13 min  Charges:    $Gait Training: 8-22 mins PT General Charges $$ ACUTE PT VISIT: 1 Visit                    Rankin Essex  PTA 09/25/23, 2:45 PM

## 2023-09-25 NOTE — Progress Notes (Signed)
 Occupational Therapy Treatment Patient Details Name: Tracy Dickerson MRN: [id number] DOB: January 09, 1941 Today's Date: 09/25/2023   History of present illness Pt to ED via [facility] from [facility]. Pt was discharged from the hospital day before. Staff reports pt had bed bugs and that they took her clothes to wash them. Pt became combative because staff took her clothing and are keeping her in her room. Staff informed EMS that pt has been throwing chairs and her walker at them. Per staff at [facility] pt becomes very upset and starts yelling if bed bugs are mentioned.   OT comments  Pt is supine in bed on arrival. Pleasant and agreeable to OT session. She denies pain. Pt performed bed mobility with SUP. Donned shoes seated EOB with SUP. Pt performed STS with SUP and mobility using RW with SUP. Pt reporting a fall in the shower recently, therefore OT assessed showers in ED. Pt ambulated to decon shower and sat on armless chair with HH shower head in place. She reports she would rather shower here, however needed Mod A to stand from armless chair. Edu on sitting d/t balance deficits and slipping hazard on wet floors. NT and nurse notified of need for shower chair/BSC or W/C to sit in for showers or armless chair with use of grab bars to pull up and not to be left alone d/t her fall risk when she is not using the RW. Toileting tasks performed with CGA and need for changing pants and mesh panties-able to perform LB dressing with CGA and extra time. Pt tends to park her walker and not take it to the toilet or bed with her, educated on using it all the way and turning then the RW can be put away. Pt appreciative of assistance.  Pt returned to bed with all needs in place and will cont to require skilled acute OT services to maximize her safety and IND to return to PLOF.       If plan is discharge home, recommend the following:  A little help with walking and/or transfers;A lot of help with  bathing/dressing/bathroom;Assistance with cooking/housework;Assist for transportation;Help with stairs or ramp for entrance   Equipment Recommendations  Other (comment) (defer to next venue)    Recommendations for Other Services      Precautions / Restrictions Precautions Precautions: Fall Restrictions Weight Bearing Restrictions Per Provider Order: No       Mobility Bed Mobility Overal bed mobility: Needs Assistance Bed Mobility: Supine to Sit, Sit to Supine     Supine to sit: Supervision Sit to supine: Supervision        Transfers Overall transfer level: Needs assistance Equipment used: Rolling walker (2 wheels) Transfers: Sit to/from Stand Sit to Stand: Supervision           General transfer comment: SUP for STS from high gurney; CGA, extra time and use of grab bars from toilet in ED     Balance Overall balance assessment: Needs assistance Sitting-balance support: Feet unsupported, No upper extremity supported Sitting balance-Leahy Scale: Good Sitting balance - Comments: steady for LB dressing EOB and on toilet   Standing balance support: Bilateral upper extremity supported, During functional activity, Reliant on assistive device for balance Standing balance-Leahy Scale: Fair Standing balance comment: used RW and CGA/SBA from therapist to perform dynamic standing activity                           ADL either performed or assessed  with clinical judgement   ADL Overall ADL's : Needs assistance/impaired     Grooming: Wash/dry hands;Contact guard assist;Standing;Supervision/safety Grooming Details (indicate cue type and reason): constant CGA/SBA for balance in standing without UE support             Lower Body Dressing: Sit to/from stand;Contact guard assist Lower Body Dressing Details (indicate cue type and reason): CGA to maintain balance in standing to pull mesh panties, pants over hips; able to don shoes seated on EOB Toilet Transfer:  Contact guard assist;Grab bars;Rolling walker (2 wheels);Regular Teacher, Adult Education Details (indicate cue type and reason): CGA for safety with toilet transfer and use of grab bar Toileting- Clothing Manipulation and Hygiene: Contact guard assist;Sit to/from stand   Tub/ Shower Transfer: Moderate assistance;Rolling walker (2 wheels) Tub/Shower Transfer Details (indicate cue type and reason): Mod A needed to stand from chair without arms in decon shower in ED-would benefit from shower chair or BSC or wheelchair to have arms to push since no grab bars in that shower Functional mobility during ADLs: Contact guard assist;Rolling walker (2 wheels);Supervision/safety General ADL Comments: SUP for mobility with RW and CGA during ADLs d/t balance deficits    Extremity/Trunk Assessment              Vision       Perception     Praxis      Cognition Arousal: Alert Behavior During Therapy: WFL for tasks assessed/performed Overall Cognitive Status: Within Functional Limits for tasks assessed                                 General Comments: stating during session I go to that office up the sidewalk there. oriented to person only??        Exercises      Shoulder Instructions       General Comments      Pertinent Vitals/ Pain       Pain Assessment Pain Assessment: No/denies pain Pain Intervention(s): Monitored during session  Home Living                                          Prior Functioning/Environment              Frequency  Min 1X/week        Progress Toward Goals  OT Goals(current goals can now be found in the care plan section)  Progress towards OT goals: Progressing toward goals  Acute Rehab OT Goals Patient Stated Goal: improve strength/safety OT Goal Formulation: With patient Time For Goal Achievement: 10/09/23 Potential to Achieve Goals: Good  Plan      Co-evaluation                 AM-PAC OT 6  Clicks Daily Activity     Outcome Measure   Help from another person eating meals?: None Help from another person taking care of personal grooming?: None Help from another person toileting, which includes using toliet, bedpan, or urinal?: A Little Help from another person bathing (including washing, rinsing, drying)?: A Little Help from another person to put on and taking off regular upper body clothing?: None Help from another person to put on and taking off regular lower body clothing?: A Little 6 Click Score: 21    End of Session Equipment Utilized During Treatment:  Rolling walker (2 wheels)  OT Visit Diagnosis: Other abnormalities of gait and mobility (R26.89)   Activity Tolerance Patient tolerated treatment well   Patient Left in bed;with call bell/phone within reach   Nurse Communication Mobility status        Time: 1422-1450 OT Time Calculation (min): 28 min  Charges: OT General Charges $OT Visit: 1 Visit OT Treatments $Self Care/Home Management : 23-37 mins  Heela Heishman, OTR/L  09/25/23, 3:11 PM   Jerone Cudmore E Coby Antrobus 09/25/2023, 3:07 PM

## 2023-09-25 NOTE — ED Notes (Signed)
 Pt given dinner tray and beverage

## 2023-09-25 NOTE — ED Notes (Signed)
 Snack provided

## 2023-09-25 NOTE — ED Notes (Signed)
Pt ambulatory to restroom with walker. Gait steady.

## 2023-09-25 NOTE — ED Notes (Signed)
 Breakfast tray given to pt

## 2023-09-25 NOTE — ED Notes (Signed)
TOC placement 

## 2023-09-25 NOTE — ED Notes (Signed)
 Coloring book returned to Patient from previous bed.

## 2023-09-25 NOTE — ED Notes (Signed)
 Pt ambulated to the bathroom with walker. To shower safely, it is recommended that a chair is provided with assistance standing to prevent falls.

## 2023-09-26 NOTE — ED Notes (Signed)
 Pt up to restroom with steady gait; walker used appropriately. No distress noted.

## 2023-09-26 NOTE — ED Notes (Signed)
 Pt resting on stretcher with side rails up and bed in lowest position. No distress noted.

## 2023-09-26 NOTE — ED Provider Notes (Signed)
 Emergency Medicine Observation Re-evaluation Note  Tracy Dickerson is a 83 y.o. female, seen on rounds today.  Pt initially presented to the ED for complaints of Agitation  Currently, the patient is resting in bed. No reported issues from nursing team.   Physical Exam  BP (!) 110/53 (BP Location: Right Arm)   Pulse 79   Temp 97.8 F (36.6 C) (Oral)   Resp 18   Ht 5' 6 (1.676 m)   Wt 90.7 kg   SpO2 91%   BMI 32.28 kg/m  Physical Exam General: Resting in bed  ED Course / MDM   No labs last 24 hours.  Plan  Current plan is for dispo per social work.    Levander Slate, MD 09/26/23 [phone]

## 2023-09-26 NOTE — ED Notes (Signed)
 Pt abmulatory to restroom with ED tech. Walker used appropriately.

## 2023-09-26 NOTE — ED Notes (Signed)
 Breakfast tray provided and pt assisted with sitting up on side of stretcher to eat more easily. Pt reports she slept well and denies pain. No acute distress noted at this time.

## 2023-09-26 NOTE — ED Notes (Signed)
 Pt given snack.

## 2023-09-26 NOTE — ED Notes (Signed)
Pt resting on stretcher with eyes closed and even respirations. No distress noted.  

## 2023-09-26 NOTE — ED Notes (Signed)
 Pt ambulatory to restroom with walker. No distress noted. Pt appears calm and cooperative.

## 2023-09-26 NOTE — ED Notes (Signed)
 Pt ambulatory to restroom in hallway with use of walker. Pt alert and calm at this time. No distress noted.

## 2023-09-27 NOTE — ED Notes (Signed)
Pt ambulated to bathroom and back

## 2023-09-27 NOTE — ED Notes (Signed)
 Meal tray provided.

## 2023-09-27 NOTE — ED Notes (Signed)
 Pt walked to the bathroom with walker - pt had steady and even gait. Went to bathroom independently.

## 2023-09-27 NOTE — ED Notes (Signed)
 Breakfast tray given to pt

## 2023-09-27 NOTE — ED Provider Notes (Signed)
-----------------------------------------   6:00 AM on 09/27/2023 -----------------------------------------   Blood pressure 134/77, pulse 72, temperature 98.3 F (36.8 C), resp. rate 16, height 5' 6 (1.676 m), weight 90.7 kg, SpO2 97%.  The patient is calm and cooperative at this time.  There have been no acute events since the last update.  Awaiting disposition plan from case management/social work.    Cortland Crehan, [name], DO 09/27/23 0600

## 2023-09-28 NOTE — ED Notes (Signed)
 Rn to bedside to introduce self to pt. Pt is alert and has been asking for icecream since 7pm.

## 2023-09-28 NOTE — ED Notes (Signed)
Pt up to use restroom, no assistance required. 

## 2023-09-28 NOTE — ED Notes (Signed)
 This RN documented head-to-toe on mid shift.

## 2023-09-28 NOTE — ED Notes (Signed)
 Pt up to use restroom, pt using walker walked down hallway without issue. Pt returned to bed and given warm blankets.

## 2023-09-28 NOTE — ED Notes (Signed)
 Pt ambulated with the walker independently to the bathroom and back. Pt has a steady even gait.

## 2023-09-28 NOTE — ED Notes (Signed)
 Pt given dinner tray, set up on tray table, pt sitting on side of bed.

## 2023-09-28 NOTE — ED Notes (Signed)
 Pt up to use restroom. Pt using walker, no assistance required, steady gait.

## 2023-09-28 NOTE — ED Notes (Addendum)
 Pt asking to call brother, Dannielle Huh. Pt given phone and number dialed by this RN.

## 2023-09-29 NOTE — ED Notes (Signed)
Pt received dinner tray.

## 2023-09-29 NOTE — ED Notes (Signed)
 Head-to-toe done on this Rns shift.

## 2023-09-29 NOTE — ED Notes (Signed)
 Patient complains of mild headache 1/10 pain

## 2023-09-29 NOTE — ED Notes (Signed)
 Resumed care from jes rn.  Pt alert.  Pt in hallway bed.

## 2023-09-29 NOTE — ED Notes (Signed)
 Pt provided with breakfast tray. Pt currently asleep, tray left at bedside.

## 2023-09-29 NOTE — TOC Progression Note (Signed)
 Transition of Care [facility]) - Progression Note    Patient Details  Name: Tracy Dickerson MRN: [id number] Date of Birth: [birth year]  Transition of Care [facility] CM/SW Contact  Lauraine JAYSON Carpen, LCSW Phone Number: 09/29/2023, 4:25 PM  Clinical Narrative:  CSW updated Nia with DSS.   Expected Discharge Plan: Long Term Nursing Home Barriers to Discharge: Continued Medical Work up  Expected Discharge Plan and Services In-house Referral: Clinical Social Work Discharge Planning Services: NA Post Acute Care Choice: Skilled Nursing Facility Living arrangements for the past 2 months: Single Family Home (Unsafe to return)                   DME Agency: NA     Representative spoke with at DME Agency: N/A HH Arranged: NA           Social Determinants of Health (SDOH) Interventions SDOH Screenings   Food Insecurity: No Food Insecurity (08/21/2023)  Housing: Medium Risk (08/21/2023)  Transportation Needs: No Transportation Needs (08/21/2023)  Utilities: Not At Risk (08/21/2023)  Financial Resource Strain: Low Risk  (02/04/2023)   Received from [facility], [facility]  Physical Activity: Inactive (07/16/2021)   Received from [facility], [facility]  Stress: No Stress Concern Present (07/16/2021)   Received from [facility], [facility]  Tobacco Use: Medium Risk (09/04/2023)  Health Literacy: Medium Risk (07/16/2021)   Received from [facility], [facility]    Readmission Risk Interventions     No data to display

## 2023-09-29 NOTE — ED Notes (Signed)
 Pt asked for some ice cream. This tech got some ice cream for pt and ensured she was sitting up properly. Pt had no other requests at this time.

## 2023-09-29 NOTE — ED Notes (Signed)
Patient ambulatory to the bathroom with walker

## 2023-09-29 NOTE — ED Provider Notes (Signed)
 Emergency Medicine Observation Re-evaluation Note  Tracy Dickerson is a 83 y.o. female, seen on rounds today.  Pt initially presented to the ED for complaints of Agitation  Currently, the patient is calm, no acute complaints.  Physical Exam  Blood pressure (!) 135/50, pulse 68, temperature 98 F (36.7 C), temperature source Oral, resp. rate 17, height 5' 6 (1.676 m), weight 90.7 kg, SpO2 98%. Physical Exam General: NAD Lungs: CTAB Psych: not agitated  ED Course / MDM  EKG:    I have reviewed the labs performed to date as well as medications administered while in observation.  Recent changes in the last 24 hours include no acute events overnight.    Plan  Current plan is for TOC placement.   Viviann Pastor, MD 09/29/23 [phone]

## 2023-09-29 NOTE — ED Notes (Signed)
Patient given a lunch tray.  

## 2023-09-29 NOTE — ED Notes (Signed)
 Pt ambulated to bathroom and back to bed.

## 2023-09-29 NOTE — ED Notes (Signed)
Pt is complaining of a headache

## 2023-09-29 NOTE — ED Notes (Signed)
 Pt ambulated to the toilet and back to bed.

## 2023-09-30 NOTE — ED Provider Notes (Signed)
 Emergency Medicine Observation Re-evaluation Note  Physical Exam   BP (!) 135/50   Pulse 68   Temp 98 F (36.7 C) (Oral)   Resp 17   Ht 5' 6 (1.676 m)   Wt 90.7 kg   SpO2 98%   BMI 32.28 kg/m   Patient appears in no acute distress.  ED Course / MDM   No reported events during my shift at the time of this note.   Pt is awaiting dispo from consultants   Ginnie Shams MD    Shams Ginnie, MD 09/30/23 [phone]

## 2023-09-30 NOTE — ED Notes (Signed)
 Lunch tray provided to pt. Pt tolerated well, waste disposed of properly.

## 2023-09-30 NOTE — ED Notes (Signed)
 Pt was place on a hospital bed. This tech assisted pt with a washcloth bath. New beh health scrubs given to pt. Pt is now laying down on the bed. No other needs found at this moment.

## 2023-10-01 NOTE — ED Notes (Signed)
Report received from Alycia, RN °

## 2023-10-01 NOTE — Progress Notes (Signed)
 Physical Therapy Treatment Patient Details Name: Tracy Dickerson MRN: [id number] DOB: 1941-02-18 Today's Date: 10/01/2023   History of Present Illness Pt to ED via [facility] from [facility]. Pt was discharged from the hospital day before. Staff reports pt had bed bugs and that they took her clothes to wash them. Pt became combative because staff took her clothing and are keeping her in her room. Staff informed EMS that pt has been throwing chairs and her walker at them. Per staff at [facility] pt becomes very upset and starts yelling if bed bugs are mentioned.    PT Comments  Patient received in bed in hallway of ED. She is agreeable to PT session. Patient is pleasantly confused. Mod I for bed mobility and transfers. She ambulated >300 feet with RW and cues for direction only. No lob, no difficulties noted or reported. Patient has no skilled needs at this time for continued skilled PT. She is encouraged to continue ambulating with nursing staff and mobility specialists. PT signing off at this time.     If plan is discharge home, recommend the following: A little help with walking and/or transfers   Can travel by private vehicle     Yes  Equipment Recommendations  Rolling walker (2 wheels)    Recommendations for Other Services       Precautions / Restrictions Precautions Precaution Comments: patient with prior history of bed bugs Restrictions Weight Bearing Restrictions Per Provider Order: No     Mobility  Bed Mobility Overal bed mobility: Modified Independent Bed Mobility: Supine to Sit, Sit to Supine     Supine to sit: Modified independent (Device/Increase time) Sit to supine: Modified independent (Device/Increase time)        Transfers Overall transfer level: Modified independent Equipment used: Rolling walker (2 wheels) Transfers: Sit to/from Stand Sit to Stand: Supervision                Ambulation/Gait Ambulation/Gait assistance: Supervision Gait Distance  (Feet): 300 Feet Assistive device: Rolling walker (2 wheels)   Gait velocity: WNL     General Gait Details: cues for direction only   Stairs             Wheelchair Mobility     Tilt Bed    Modified Rankin (Stroke Patients Only)       Balance Overall balance assessment: Needs assistance Sitting-balance support: Feet supported Sitting balance-Leahy Scale: Normal     Standing balance support: Bilateral upper extremity supported, During functional activity, Reliant on assistive device for balance Standing balance-Leahy Scale: Good Standing balance comment: used RW supervision                            Cognition Arousal: Alert Behavior During Therapy: WFL for tasks assessed/performed Overall Cognitive Status: History of cognitive impairments - at baseline                                          Exercises      General Comments        Pertinent Vitals/Pain Pain Assessment Pain Assessment: No/denies pain    Home Living                          Prior Function            PT Goals (  current goals can now be found in the care plan section) Acute Rehab PT Goals Patient Stated Goal: none stated PT Goal Formulation: Patient unable to participate in goal setting Progress towards PT goals: Goals met/education completed, patient discharged from PT    Frequency           PT Plan      Co-evaluation              AM-PAC PT 6 Clicks Mobility   Outcome Measure  Help needed turning from your back to your side while in a flat bed without using bedrails?: None Help needed moving from lying on your back to sitting on the side of a flat bed without using bedrails?: None Help needed moving to and from a bed to a chair (including a wheelchair)?: None Help needed standing up from a chair using your arms (e.g., wheelchair or bedside chair)?: None Help needed to walk in hospital room?: A Little Help needed climbing  3-5 steps with a railing? : A Little 6 Click Score: 22    End of Session   Activity Tolerance: Patient tolerated treatment well Patient left: in bed Nurse Communication: Mobility status       Time: 8676-8668 PT Time Calculation (min) (ACUTE ONLY): 8 min  Charges:    $Gait Training: 8-22 mins PT General Charges $$ ACUTE PT VISIT: 1 Visit                     Lipa Knauff, PT, GCS 10/01/23,1:48 PM

## 2023-10-01 NOTE — ED Notes (Signed)
 Patient given lunch tray.

## 2023-10-01 NOTE — Progress Notes (Signed)
 Mobility Specialist - Progress Note     10/01/23 1200  Mobility  Activity Ambulated with assistance in hallway  Level of Assistance Standby assist, set-up cues, supervision of patient - no hands on  Assistive Device Front wheel walker  Distance Ambulated (ft) 360 ft  Range of Motion/Exercises Active  Activity Response Tolerated well  Mobility Referral Yes  Mobility visit 1 Mobility   Pt resting EOB upon entry on RA. Pt STS and ambulates to hallway around NS SBA with RW. Pt maintains steady upright positioning during walk. No SOB or LOB. Pt returned to EOB and left with needs in reach.   Guido Rumble Mobility Specialist 10/01/23, 12:11 PM

## 2023-10-01 NOTE — ED Notes (Signed)
 Patient working with mobility specialist at this time.

## 2023-10-01 NOTE — ED Notes (Signed)
 Patient escorted to the restroom. Patient used the restroom and began to exit the bathroom in the wrong direction. This RN redirected patient and told her that her bed was the other direction down the hall. The patient became agitated and stated I am going to see my brother. This RN told patient that she can not go upstairs to see her brother at this time but I would be willing to give her the phone to talk to her family. The patient denied the phone and returned to bed. Once patient sat back down in bed, she became calm and cooperative. Patient resting in bed at this time.

## 2023-10-01 NOTE — ED Provider Notes (Signed)
 Emergency Medicine Observation Re-evaluation Note  Physical Exam   BP 139/68 (BP Location: Left Arm)   Pulse 79   Temp 98.1 F (36.7 C) (Oral)   Resp 20   Ht 5' 6 (1.676 m)   Wt 90.7 kg   SpO2 98%   BMI 32.28 kg/m   Patient appears in no acute distress.  ED Course / MDM   No reported events during my shift at the time of this note.   Pt is awaiting dispo from consultants   Ginnie Shams MD    Shams Ginnie, MD 10/01/23 [phone]

## 2023-10-01 NOTE — ED Notes (Signed)
 Patient began getting angry with staff after talking to family on the phone. Patient stood up and began walking around stating my brother is at this hospital and I want to go see him. This RN told patient to return to bed and we would talk to the charge nurse about escorting her upstairs to visit her brother. Patient threw her walker and began raising her voice saying I want to go see my brother. My family just told me to come see my brother. This RN repeated to the patient that she is not currently able to walk around the hospital without begin escorted and we would try to work it out for her to visit her brother. Patient continued to yell but returned to bed and did not pose a physical threat to staff. Patient sitting on side of bed at this time talking to Officer [employer].

## 2023-10-01 NOTE — ED Notes (Signed)
 Dinner tray provided; Pt ate 95% of meal; Waste disposed of properly.

## 2023-10-01 NOTE — ED Notes (Signed)
 Patient ambulated to restroom and back to ER bed at this time.

## 2023-10-01 NOTE — ED Notes (Signed)
 Patient ambulatory to restroom and back to bed without incident.

## 2023-10-02 MED ORDER — ASPIRIN 325 MG PO TABS
325.0000 mg | ORAL_TABLET | Freq: Once | ORAL | Status: AC
  Administered 2023-10-02: 325 mg via ORAL
  Filled 2023-10-02: qty 1

## 2023-10-02 MED ORDER — ASPIRIN 325 MG PO TABS: 325.0000 mg | ORAL_TABLET | Freq: Every day | ORAL | Status: DC

## 2023-10-02 NOTE — ED Notes (Signed)
 Patient medicated and provided with coke to drink at request. VSS. Back to resting quietly with eyes closed. No other needs identified at this time.

## 2023-10-02 NOTE — ED Notes (Signed)
 TOC

## 2023-10-02 NOTE — ED Notes (Signed)
 Pt given hospital provided lunch tray.

## 2023-10-02 NOTE — ED Notes (Signed)
 Patient ambulatory to restroom and back to ER bed without incident. No other needs identified at this time.

## 2023-10-02 NOTE — ED Notes (Addendum)
 This tech assisted pt to decon shower and helped pt shower. This tech then changed bed linen and helped pt back to bed.

## 2023-10-02 NOTE — ED Provider Notes (Signed)
-----------------------------------------   5:36 AM on 10/02/2023 -----------------------------------------   Blood pressure (!) 142/60, pulse 69, temperature 98.2 F (36.8 C), temperature source Oral, resp. rate 20, height 5' 6 (1.676 m), weight 90.7 kg, SpO2 98%.  The patient is calm and cooperative at this time.  There have been no acute events since the last update.  Awaiting disposition plan from Social Work team.   Ledford Goodson J, MD 10/02/23 [phone]

## 2023-10-03 MED ORDER — MELATONIN 5 MG PO TABS
5.0000 mg | ORAL_TABLET | Freq: Every day | ORAL | Status: DC
  Administered 2023-10-03 – 2023-12-30 (×89): 5 mg via ORAL
  Filled 2023-10-03 (×90): qty 1

## 2023-10-03 NOTE — ED Notes (Signed)
 Pt ambulatory to restroom and back to ER bed without incident.

## 2023-10-03 NOTE — ED Notes (Signed)
Pending TOC Placement

## 2023-10-03 NOTE — ED Notes (Signed)
 Pt ambulating with mobility specialist

## 2023-10-03 NOTE — ED Notes (Signed)
 Pt given ice cream by this RN per request

## 2023-10-03 NOTE — ED Provider Notes (Signed)
-----------------------------------------   11:26 AM on 10/03/2023 -----------------------------------------   Blood pressure (!) 151/80, pulse 73, temperature 97.6 F (36.4 C), temperature source Oral, resp. rate 16, height 5' 6 (1.676 m), weight 90.7 kg, SpO2 100%.  The patient is calm and cooperative at this time.  There have been no acute events since the last update.  Awaiting disposition plan from Social Work team.    Jacolyn Pae, MD 10/03/23 1126

## 2023-10-03 NOTE — ED Notes (Signed)
 Patient ambulates to the BR independently w/ walker.

## 2023-10-03 NOTE — Progress Notes (Signed)
 Mobility Specialist - Progress Note    10/03/23 1500  Mobility  Activity Ambulated independently in hallway  Level of Assistance Modified independent, requires aide device or extra time  Assistive Device Front wheel walker  Distance Ambulated (ft) 480 ft  Range of Motion/Exercises Active  Activity Response Tolerated well  Mobility Referral Yes  Mobility visit 1 Mobility  Mobility Specialist Start Time (ACUTE ONLY) 1512  Mobility Specialist Stop Time (ACUTE ONLY) 1530  Mobility Specialist Time Calculation (min) (ACUTE ONLY) 18 min   Pt resting in bed on RA upon entry. Pt STS and ambulates to hallway around NS ModI with RW. Pt returned to bed and left with needs in reach. Pt endorses no pain or LOB during ambulation.\   Guido Rumble Mobility Specialist 10/03/23, 3:47 PM

## 2023-10-04 NOTE — ED Notes (Signed)
 Pt given dinner tray.

## 2023-10-04 NOTE — ED Notes (Signed)
 Dietary called, stated they would make a new tray and send it to pt.

## 2023-10-04 NOTE — ED Notes (Addendum)
 Pt requested and received a snack by this tech. No other request at this time.

## 2023-10-04 NOTE — ED Notes (Signed)
 Pt up to use restroom. Pt walking with walker, no assistance required.

## 2023-10-04 NOTE — Progress Notes (Signed)
 Mobility Specialist - Progress Note   Pre-mobility: HR, BP, SpO2 During mobility: HR, BP, SpO2 Post-mobility: HR, BP, SPO2     10/04/23 1201  Mobility  Activity Ambulated independently in hallway;Stood at bedside;Dangled on edge of bed  Level of Assistance Modified independent, requires aide device or extra time  Assistive Device Front wheel walker  Distance Ambulated (ft) 80 ft  Range of Motion/Exercises Active  Activity Response Tolerated well  Mobility Referral Yes  Mobility visit 1 Mobility   Pt walking to bpod upon entry. Pt was trying to call her family member. Pt redirected back to bed. Pt ambulates with RW ModI. No LOB or signs of distress reported.   Guido Rumble Mobility Specialist 10/04/23, 12:04 PM

## 2023-10-04 NOTE — ED Notes (Signed)
 Pt requested and received a Cola Drink. No other request at this time.

## 2023-10-04 NOTE — ED Notes (Signed)
 Pt returned to bed using walker, no assistance required.

## 2023-10-04 NOTE — ED Notes (Signed)
 Pt given lunch tray.

## 2023-10-04 NOTE — ED Notes (Signed)
 Pt given carton of milk while waiting on breakfast tray.

## 2023-10-04 NOTE — ED Notes (Signed)
 Pt ambulated to the restroom and back without assistance. Pt tolerated ambulation well. Pt has no other requests at this time.

## 2023-10-04 NOTE — ED Notes (Signed)
 Pt ambulated to bathroom and back independently with walker.  Gait steady.

## 2023-10-04 NOTE — ED Notes (Signed)
 Pt ambulated to bathroom and back to bed using walker, independently.  Gait steady.

## 2023-10-04 NOTE — ED Notes (Signed)
 VSS. Pt ambulates to BR w/ walker.

## 2023-10-04 NOTE — ED Notes (Signed)
 Dietary called for pt tray, told her ticket printed should be out for delivery.

## 2023-10-04 NOTE — ED Notes (Signed)
 Pt sleeping w/ even and unlabored respirations.

## 2023-10-04 NOTE — ED Provider Notes (Signed)
 Emergency Medicine Observation Re-evaluation Note  Tracy Dickerson is a 83 y.o. female, awaiting placement  Physical Exam  BP 125/65 (BP Location: Left Arm)   Pulse 64   Temp (!) 97.5 F (36.4 C) (Oral)   Resp 18   Ht 5' 6 (1.676 m)   Wt 90.7 kg   SpO2 95%   BMI 32.28 kg/m  Physical Exam General: calm  ED Course / MDM  No new labs in past 24 hours  Plan  Current plan is for placement.    Floy Roberts, MD 10/04/23 [phone]

## 2023-10-04 NOTE — ED Notes (Signed)
 Pt given breakfast tray

## 2023-10-05 NOTE — ED Notes (Signed)
 Pt ambulated to bathroom w 4 wheel walker and returned to bed without incident

## 2023-10-05 NOTE — ED Notes (Signed)
 Pt ambulated to bathroom and back independently with walker.

## 2023-10-05 NOTE — ED Notes (Signed)
Pt given ice cream per request.  

## 2023-10-05 NOTE — ED Provider Notes (Signed)
-----------------------------------------   3:25 AM on 10/05/2023 -----------------------------------------   Blood pressure (!) 144/65, pulse 81, temperature (!) 97.5 F (36.4 C), temperature source Oral, resp. rate 18, height 5' 6 (1.676 m), weight 90.7 kg, SpO2 98%.  The patient is calm and cooperative at this time.  There have been no acute events since the last update.  Awaiting disposition plan from case management/social work.    Twilla Khouri, [name], DO 10/05/23 [phone]

## 2023-10-06 NOTE — Progress Notes (Signed)
 Occupational Therapy Treatment Patient Details Name: Tracy Dickerson MRN: [id number] DOB: [birth year] Today's Date: 10/06/2023   History of present illness Pt to ED via [facility] from [facility]. Pt was discharged from the hospital day before. Staff reports pt had bed bugs and that they took her clothes to wash them. Pt became combative because staff took her clothing and are keeping her in her room. Staff informed EMS that pt has been throwing chairs and her walker at them. Per staff at [facility] pt becomes very upset and starts yelling if bed bugs are mentioned.   OT comments  Pt is supine in bed on arrival. Easily arousable and agreeable to OT session. She denies pain. Pt performed bed mobility and STS with MOD I. She was able to perform LB dressing to don shoes, pants and mesh panties with MOD I/SUP seated and standing. Toilet transfer with MOD I, clothing management and hygiene all performed MOD I. Pt ambulated using RW throughout ED with MOD I for 200-300 feet. She continues to work with the mobility specialists and has no further acute skilled OT needs. OT to sign off in house with goals met and all education completed.       If plan is discharge home, recommend the following:  Direct supervision/assist for medications management;Supervision due to cognitive status;Direct supervision/assist for financial management;Assist for transportation;Assistance with cooking/housework;Help with stairs or ramp for entrance   Equipment Recommendations  Other (comment) (RW)    Recommendations for Other Services      Precautions / Restrictions Precautions Precautions: None Restrictions Weight Bearing Restrictions Per Provider Order: No       Mobility Bed Mobility Overal bed mobility: Modified Independent                  Transfers Overall transfer level: Modified independent Equipment used: Rolling walker (2 wheels) Transfers: Sit to/from Stand Sit to Stand: Modified independent  (Device/Increase time)           General transfer comment: MOD I for STS from hospital bed and MOD I to SUP for ambulating the ED hallways 200-367ft     Balance   Sitting-balance support: Feet supported Sitting balance-Leahy Scale: Normal Sitting balance - Comments: steady for LB dressing EOB and on toilet   Standing balance support: Bilateral upper extremity supported, During functional activity, Reliant on assistive device for balance Standing balance-Leahy Scale: Good Standing balance comment: used RW supervision                           ADL either performed or assessed with clinical judgement   ADL Overall ADL's : Needs assistance/impaired     Grooming: Wash/dry hands;Standing;Modified independent               Lower Body Dressing: Sit to/from stand;Sitting/lateral leans;Supervision/safety;Modified independent Lower Body Dressing Details (indicate cue type and reason): changed mesh panties in the bathroom seated on toilet and able to stand to pull pants and panties over hips with no issues; able to don/doff shoes seated at EOB and on toilet with no issues Toilet Transfer: Modified Independent;Grab bars;Rolling walker (2 wheels);Regular Toilet   Toileting- Clothing Manipulation and Hygiene: Modified independent              Extremity/Trunk Assessment              Vision       Perception     Praxis      Cognition Arousal: Alert  Behavior During Therapy: WFL for tasks assessed/performed Overall Cognitive Status: History of cognitive impairments - at baseline                                          Exercises      Shoulder Instructions       General Comments      Pertinent Vitals/ Pain       Pain Assessment Pain Assessment: No/denies pain Pain Intervention(s): Monitored during session  Home Living                                          Prior Functioning/Environment               Frequency  Min 1X/week        Progress Toward Goals  OT Goals(current goals can now be found in the care plan section)  Progress towards OT goals: Goals met/education completed, patient discharged from OT  Acute Rehab OT Goals Patient Stated Goal: improve strength/safety OT Goal Formulation: With patient Time For Goal Achievement: 10/09/23 Potential to Achieve Goals: Good  Plan      Co-evaluation                 AM-PAC OT 6 Clicks Daily Activity     Outcome Measure   Help from another person eating meals?: None Help from another person taking care of personal grooming?: None Help from another person toileting, which includes using toliet, bedpan, or urinal?: None Help from another person bathing (including washing, rinsing, drying)?: A Little Help from another person to put on and taking off regular upper body clothing?: None Help from another person to put on and taking off regular lower body clothing?: None 6 Click Score: 23    End of Session Equipment Utilized During Treatment: Rolling walker (2 wheels)  OT Visit Diagnosis: Other abnormalities of gait and mobility (R26.89)   Activity Tolerance Patient tolerated treatment well   Patient Left in bed;with call bell/phone within reach;with nursing/sitter in room   Nurse Communication Mobility status        Time: 8450-8394 OT Time Calculation (min): 16 min  Charges: OT General Charges $OT Visit: 1 Visit OT Treatments $Self Care/Home Management : 8-22 mins  Micheil Klaus, OTR/L  10/06/23, 4:25 PM   Belvie Iribe E Jenika Chiem 10/06/2023, 4:23 PM

## 2023-10-06 NOTE — ED Provider Notes (Signed)
-----------------------------------------   6:51 AM on 10/06/2023 -----------------------------------------   Blood pressure (!) 154/72, pulse 65, temperature 98.2 F (36.8 C), resp. rate 18, height 5' 6 (1.676 m), weight 90.7 kg, SpO2 95%.  The patient is calm and cooperative at this time.  There have been no acute events since the last update.  Awaiting disposition plan from Social Work team.   Jezreel Sisk J, MD 10/06/23 [phone]

## 2023-10-06 NOTE — [place] FL2 (Signed)
 [place]  MEDICAID FL2 LEVEL OF CARE FORM     IDENTIFICATION  Patient Name: Tracy Dickerson Birthdate: 31-Aug-1941 Sex: female Admission Date (Current Location): 09/04/2023  [facility] [place] Number:  Chiropodist and Address:  [facility], [address], [place], [place] [address]      Provider Number: [id number]  Attending Physician Name and Address:  No att. providers found  Relative Name and Phone Number:       Current Level of Care: Hospital Recommended Level of Care: Memory Care Prior Approval Number:    Date Approved/Denied:   PASRR Number:    Discharge Plan: Other (Comment) (Memory Care)    Current Diagnoses: Patient Active Problem List   Diagnosis Date Noted   Low serum cortisol level 09/02/2023   Headache 08/30/2023   Vitamin D  deficiency 08/28/2023   Dizziness 08/25/2023   Iron  deficiency anemia 08/23/2023   Iron  deficiency anemia due to chronic blood loss 08/21/2023   Syncope 08/20/2023   Normocytic anemia 08/20/2023   Obesity (BMI 30-39.9) 08/20/2023   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/2023   Vitamin B12 deficiency 06/20/2023   Carotid stenosis 06/20/2023   Dementia (HCC) 06/19/2023   Generalized weakness 06/18/2023   Acute on chronic anemia 06/18/2023   H/O: upper GI bleed 06/18/2023   Chronic anticoagulation 06/18/2023   Infestation by bed bug 06/18/2023   CKD stage 3a, GFR 45-59 ml/min (HCC) 01/07/2023   Overweight (BMI 25.0-29.9) 01/06/2023   Type 2 diabetes mellitus with hyperlipidemia (HCC) 01/06/2023   COPD (chronic obstructive pulmonary disease) (HCC) 01/06/2023   Hypokalemia 01/06/2023   UGIB (upper gastrointestinal bleed) 01/05/2023   Unsatisfactory living conditions 01/05/2023   Constipation    UTI (urinary tract infection) 08/13/2020   HLD (hyperlipidemia) 08/13/2020   Acute renal failure superimposed on stage 3a chronic kidney disease (HCC) 08/13/2020   PAF  (paroxysmal atrial fibrillation) (HCC) 08/13/2020   HTN (hypertension) 08/13/2020   Dehydration    Dehydration, moderate    Acute renal failure (HCC) 04/22/2020   Hyperkalemia    Septic shock (HCC)    Sepsis due to urinary tract infection (HCC) 03/13/2018   Acute kidney injury superimposed on chronic kidney disease (HCC) 02/13/2018   Closed extra-articular fracture of distal tibia 02/02/2018   Community acquired pneumonia 10/29/2017   Orthostatic hypotension 01/26/2017   Lung nodule 05/30/2014   Sleep apnea 05/10/2014   Barrett's esophagus 05/13/2007   Malignant neoplasm of breast (female) (HCC) 06/01/1997    Orientation RESPIRATION BLADDER Height & Weight     Self, Place  Normal Continent Weight: 200 lb (90.7 kg) Height:  5' 6 (167.6 cm)  BEHAVIORAL SYMPTOMS/MOOD NEUROLOGICAL BOWEL NUTRITION STATUS   (None)  (Dementia) Continent Diet (Regular)  AMBULATORY STATUS COMMUNICATION OF NEEDS Skin   Supervision Verbally Bruising                       Personal Care Assistance Level of Assistance  Bathing, Feeding, Dressing Bathing Assistance: Limited assistance Feeding assistance: Limited assistance Dressing Assistance: Limited assistance     Functional Limitations Info  Sight, Hearing, Speech Sight Info: Adequate Hearing Info: Adequate Speech Info: Adequate    SPECIAL CARE FACTORS FREQUENCY                       Contractures Contractures Info: Not present    Additional Factors Info  Code Status, Allergies Code Status Info: Full code Allergies Info: Oxycodone ,  Percocet (Oxycodone -acetaminophen ), Penicillins           Current Medications (10/06/2023):  This is the current hospital active medication list Current Facility-Administered Medications  Medication Dose Route Frequency Provider Last Rate Last Admin   acetaminophen  (TYLENOL ) tablet 1,000 mg  1,000 mg Oral Q6H PRN Claudene Rover, MD   1,000 mg at 10/02/23 1841   acetaminophen  (TYLENOL ) tablet 500 mg   500 mg Oral Q6H PRN Ernest Ronal BRAVO, MD   500 mg at 09/29/23 1256   albuterol  (VENTOLIN  HFA) 108 (90 Base) MCG/ACT inhaler 2 puff  2 puff Inhalation Q4H PRN Ernest Ronal BRAVO, MD       apixaban  (ELIQUIS ) tablet 5 mg  5 mg Oral BID Ernest Ronal BRAVO, MD   5 mg at 10/06/23 9043   atorvastatin  (LIPITOR) tablet 20 mg  20 mg Oral Daily Ernest Ronal BRAVO, MD   20 mg at 10/06/23 9043   bisacodyl  (DULCOLAX) suppository 10 mg  10 mg Rectal Daily PRN Ernest Ronal BRAVO, MD       gabapentin  (NEURONTIN ) capsule 600 mg  600 mg Oral QHS Ernest Ronal BRAVO, MD   600 mg at 10/05/23 2222   hydrocortisone  (CORTEF ) tablet 5 mg  5 mg Oral Daily Ernest Ronal BRAVO, MD   5 mg at 10/06/23 9043   hydrOXYzine  (ATARAX ) tablet 10 mg  10 mg Oral TID PRN Ernest Ronal BRAVO, MD       melatonin tablet 5 mg  5 mg Oral QHS Suzanne Kirsch, MD   5 mg at 10/05/23 2222   ondansetron  (ZOFRAN -ODT) disintegrating tablet 4 mg  4 mg Oral Q8H PRN Ernest Ronal BRAVO, MD       pantoprazole  (PROTONIX ) EC tablet 40 mg  40 mg Oral BID Ernest Ronal BRAVO, MD   40 mg at 10/06/23 9043   Current Outpatient Medications  Medication Sig Dispense Refill   acetaminophen  (TYLENOL ) 500 MG tablet Take 500 mg by mouth every 6 (six) hours as needed.     albuterol  (PROAIR  HFA) 108 (90 Base) MCG/ACT inhaler Inhale 2 puffs into the lungs every 4 (four) hours as needed for wheezing or shortness of breath.      apixaban  (ELIQUIS ) 5 MG TABS tablet Take 1 tablet (5 mg total) by mouth 2 (two) times daily. 90 tablet 1   atorvastatin  (LIPITOR) 20 MG tablet Take 20 mg by mouth daily.     bisacodyl  (DULCOLAX) 10 MG suppository Place 1 suppository (10 mg total) rectally daily as needed for severe constipation.     cetirizine  (ZYRTEC ) 5 MG tablet Take 1 tablet (5 mg total) by mouth daily. 30 tablet 2   cyanocobalamin  (VITAMIN B12) 1000 MCG tablet Take 1 tablet (1,000 mcg total) by mouth daily. 90 tablet 1   fluticasone -salmeterol (ADVAIR) 250-50 MCG/ACT AEPB Inhale 1 puff into the lungs in the morning and at  bedtime.     folic acid  (FOLVITE ) 1 MG tablet Take 1 tablet (1 mg total) by mouth daily.     gabapentin  (NEURONTIN ) 300 MG capsule Take 2 capsules (600 mg total) by mouth at bedtime.     hydrocortisone  (CORTEF ) 5 MG tablet Take 1 tablet (5 mg total) by mouth daily.     hydrOXYzine  (ATARAX ) 10 MG tablet Take 1 tablet (10 mg total) by mouth 3 (three) times daily as needed for itching.     meclizine  (ANTIVERT ) 12.5 MG tablet Take 1 tablet (12.5 mg total) by mouth 3 (three) times daily as needed for dizziness. 30  tablet 0   midodrine  (PROAMATINE ) 5 MG tablet Take 1 tablet (5 mg total) by mouth 3 (three) times daily with meals.     ondansetron  (ZOFRAN  ODT) 4 MG disintegrating tablet Take 1 tablet (4 mg total) by mouth every 8 (eight) hours as needed. 15 tablet 0   pantoprazole  (PROTONIX ) 40 MG tablet Take 1 tablet (40 mg total) by mouth 2 (two) times daily. 180 tablet 1   SPIRIVA  HANDIHALER 18 MCG inhalation capsule Place 1 capsule (18 mcg total) into inhaler and inhale daily. 30 capsule 2   traMADol  (ULTRAM ) 50 MG tablet Take 1 tablet (50 mg total) by mouth 2 (two) times daily as needed. 30 tablet 0   triamcinolone ointment (KENALOG) 0.1 % Apply 1 Application topically 2 (two) times daily.     Vitamin D , Ergocalciferol , (DRISDOL ) 1.25 MG (50000 UNIT) CAPS capsule Take 1 capsule (50,000 Units total) by mouth every 7 (seven) days for 7 doses.       Discharge Medications: Please see discharge summary for a list of discharge medications.  Relevant Imaging Results:  Relevant Lab Results:   Additional Information SS#: [id number]  Lauraine JAYSON Carpen, LCSW

## 2023-10-06 NOTE — TOC Progression Note (Signed)
 Transition of Care [facility]) - Progression Note    Patient Details  Name: Tracy Dickerson MRN: [id number] Date of Birth: 10/21/1940  Transition of Care [facility]) CM/SW Contact  Lauraine JAYSON Carpen, LCSW Phone Number: 10/06/2023, 4:11 PM  Clinical Narrative: [facility] [place] does not have any memory care beds. CSW faxed referral to [facility]. Patient would have to apply for special assistance Medicaid prior to admitting if they can take her.    Expected Discharge Plan: Long Term Nursing Home Barriers to Discharge: Continued Medical Work up  Expected Discharge Plan and Services In-house Referral: Clinical Social Work Discharge Planning Services: NA Post Acute Care Choice: Skilled Nursing Facility Living arrangements for the past 2 months: Single Family Home (Unsafe to return)                   DME Agency: NA     Representative spoke with at DME Agency: N/A HH Arranged: NA           Social Determinants of Health (SDOH) Interventions SDOH Screenings   Food Insecurity: No Food Insecurity (08/21/2023)  Housing: Medium Risk (08/21/2023)  Transportation Needs: No Transportation Needs (08/21/2023)  Utilities: Not At Risk (08/21/2023)  Financial Resource Strain: Low Risk  (02/04/2023)   Received from [facility], [facility]  Physical Activity: Inactive (07/16/2021)   Received from [facility], [facility]  Stress: No Stress Concern Present (07/16/2021)   Received from [facility], [facility]  Tobacco Use: Medium Risk (09/04/2023)  Health Literacy: Medium Risk (07/16/2021)   Received from [facility], [facility]    Readmission Risk Interventions     No data to display

## 2023-10-06 NOTE — ED Notes (Signed)
 Pt ambulated independently with walker to restroom and back to pt's bed.

## 2023-10-06 NOTE — ED Notes (Signed)
 Pt walking with mobility specialist, will give meds when pt returns.

## 2023-10-06 NOTE — Progress Notes (Signed)
 Mobility Specialist - Progress Note     10/06/23 0949  Mobility  Activity Ambulated independently in hallway  Level of Assistance Modified independent, requires aide device or extra time  Assistive Device Front wheel walker  Distance Ambulated (ft) 540 ft  Range of Motion/Exercises Active  Activity Response Tolerated well  Mobility Referral Yes  Mobility visit 1 Mobility  Mobility Specialist Start Time (ACUTE ONLY) [id number]  Mobility Specialist Stop Time (ACUTE ONLY) 0942  Mobility Specialist Time Calculation (min) (ACUTE ONLY) 16 min    [employer] Mobility Specialist 10/06/23, 9:51 AM

## 2023-10-06 NOTE — ED Notes (Signed)
Pt given PM snack at this time.  

## 2023-10-07 NOTE — ED Notes (Signed)
 Pt asking for dinner meal.  Pt was given a dinner meal earlier this afternoon.

## 2023-10-07 NOTE — ED Notes (Signed)
 Pt given a dinner dray and consumed her meal and drink.

## 2023-10-07 NOTE — ED Notes (Signed)
 Assumed care of patient. Patient is laying in bed, resting.

## 2023-10-07 NOTE — ED Notes (Signed)
 Hospital meal provided.  100% consumed, pt tolerated w/o complaints.  Waste discarded appropriately.

## 2023-10-07 NOTE — ED Notes (Signed)
 Snack provided

## 2023-10-07 NOTE — ED Provider Notes (Signed)
 Emergency Medicine Observation Re-evaluation Note  Physical Exam   BP (!) 132/58 (BP Location: Left Arm)   Pulse 84   Temp 98.3 F (36.8 C) (Oral)   Resp 18   Ht 5' 6 (1.676 m)   Wt 90.7 kg   SpO2 93%   BMI 32.28 kg/m   Patient appears in no acute distress.  ED Course / MDM   No reported events during my shift at the time of this note.   Pt is awaiting dispo from consultants   Ginnie Shams MD    Shams Ginnie, MD 10/07/23 [phone]

## 2023-10-08 NOTE — ED Notes (Signed)
 Pt had ice cream for snack

## 2023-10-08 NOTE — ED Provider Notes (Signed)
-----------------------------------------   5:29 AM on 10/08/2023 -----------------------------------------   Blood pressure 138/77, pulse (!) 48, temperature 97.6 F (36.4 C), temperature source Oral, resp. rate 17, height 5\' 6"  (1.676 m), weight 90.7 kg, SpO2 99%.  The patient is calm and cooperative at this time.  There have been no acute events since the last update.  Awaiting disposition plan from Social Work team.   Saraiyah Hemminger J, MD 10/08/23 0530

## 2023-10-08 NOTE — ED Notes (Signed)
 Pt found picture underneath bed.  Pt happy again  picture taped on the wall for pt.

## 2023-10-08 NOTE — ED Provider Notes (Signed)
 Emergency Medicine Observation Re-evaluation Note  Tracy Dickerson is a 83 y.o. female, seen on rounds today.  Pt initially presented to the ED for complaints of Agitation  Currently, the patient is resting in bed. No reported issues from nursing team.   Physical Exam  BP (!) 163/68   Pulse 81   Temp 97.6 F (36.4 C) (Oral)   Resp 17   Ht 5\' 6"  (1.676 m)   Wt 90.7 kg   SpO2 97%   BMI 32.28 kg/m  Physical Exam General: Resting in bed  ED Course / MDM   No labs last 24 hours.  Plan  Current plan is for dispo per social work.    Claria Crofts, MD 10/08/23 [phone]

## 2023-10-08 NOTE — ED Notes (Signed)
 Hospital meal provided.  100% consumed, pt tolerated w/o complaints.  Waste discarded appropriately.

## 2023-10-08 NOTE — TOC Progression Note (Addendum)
 Transition of Care [facility]) - Progression Note    Patient Details  Name: Tracy Dickerson MRN: [id number] Date of Birth: September 13, 1941  Transition of Care [facility]) CM/SW Contact  Tilmon Font, [place] Phone Number: 10/08/2023, 1:57 PM  Clinical Narrative:     CSW completed chart review and faxed out to four additional memory care facilities. TOC will continue to follow.   CSW made contact w/ Tammy to follow up on bed availability with pt. Tammy stated she will return call to CSW.    Expected Discharge Plan: Long Term Nursing Home Barriers to Discharge: Continued Medical Work up  Expected Discharge Plan and Services In-house Referral: Clinical Social Work Discharge Planning Services: NA Post Acute Care Choice: Skilled Nursing Facility Living arrangements for the past 2 months: Single Family Home (Unsafe to return)                   DME Agency: NA     Representative spoke with at DME Agency: N/A HH Arranged: NA           Social Determinants of Health (SDOH) Interventions SDOH Screenings   Food Insecurity: No Food Insecurity (08/21/2023)  Housing: Medium Risk (08/21/2023)  Transportation Needs: No Transportation Needs (08/21/2023)  Utilities: Not At Risk (08/21/2023)  Financial Resource Strain: Low Risk  (02/04/2023)   [facility] from [facility], [facility]  Physical Activity: Inactive (07/16/2021)   Received from [facility], [facility]  Stress: No Stress Concern Present (07/16/2021)   Received from [facility], [facility]  Tobacco Use: Medium Risk (09/04/2023)  Health Literacy: Medium Risk (07/16/2021)   Received from [facility], [facility]    Readmission Risk Interventions     No data to display

## 2023-10-08 NOTE — ED Notes (Signed)
 Pt refusing dinner meal tray.  Pt inquiring about a picture that was on her wall.  No picture located at nursing station. No picture on the floor.   Pt aware.

## 2023-10-08 NOTE — ED Notes (Signed)
 Report received from Amy, RN.

## 2023-10-08 NOTE — ED Notes (Signed)
 TOC

## 2023-10-09 NOTE — ED Notes (Addendum)
Pt sitting up on side of stretcher talkative and pleasant.

## 2023-10-09 NOTE — ED Notes (Signed)
Pt consumed 100% of meal. Waste discarded by patient appropriately.

## 2023-10-09 NOTE — Progress Notes (Signed)
Mobility Specialist - Progress Note     10/09/23 1100  Mobility  Activity Ambulated with assistance in hallway  Level of Assistance Standby assist, set-up cues, supervision of patient - no hands on  Assistive Device Front wheel walker  Distance Ambulated (ft) 480 ft  Range of Motion/Exercises Active  Activity Response Tolerated well  Mobility Referral Yes  Mobility visit 1 Mobility    Jessicca Stitzer Mobility Specialist 10/09/23, 11:31 AM

## 2023-10-09 NOTE — ED Notes (Signed)
Pt ambulated to restroom with walker. Steady gait.

## 2023-10-09 NOTE — ED Notes (Signed)
Pt ambulating with use of walker accompanied by mobility team. Alert and talkative at this time.

## 2023-10-09 NOTE — ED Notes (Signed)
Patient yelling at staff members that she doesn't need their help when offered assistance with tray.

## 2023-10-09 NOTE — ED Notes (Signed)
Pt returned from walking with staff.

## 2023-10-09 NOTE — ED Notes (Signed)
Pt brother at the bedside visiting

## 2023-10-09 NOTE — ED Provider Notes (Signed)
-----------------------------------------   5:37 AM on 10/09/2023 -----------------------------------------   Blood pressure (!) 163/68, pulse 81, temperature 97.6 F (36.4 C), temperature source Oral, resp. rate 17, height 5\' 6"  (1.676 m), weight 90.7 kg, SpO2 97%.  The patient is calm and cooperative at this time.  There have been no acute events since the last update.  Awaiting disposition plan from [facility] team.   Janith Lima, MD 10/09/23 [phone]

## 2023-10-09 NOTE — ED Notes (Addendum)
Breakfast tray and milk provided for pt on hallway on table.

## 2023-10-09 NOTE — ED Notes (Signed)
Pt ambulatory to restroom with walker. Gait steady.

## 2023-10-09 NOTE — ED Notes (Signed)
Pt given lunch tray and beverage 

## 2023-10-10 MED ORDER — HALOPERIDOL LACTATE 5 MG/ML IJ SOLN
2.5000 mg | Freq: Once | INTRAMUSCULAR | Status: AC
  Administered 2023-10-10: 2.5 mg via INTRAMUSCULAR
  Filled 2023-10-10: qty 1

## 2023-10-10 MED ORDER — LORAZEPAM 2 MG/ML IJ SOLN
1.0000 mg | Freq: Once | INTRAMUSCULAR | Status: AC
  Administered 2023-10-10: 1 mg via INTRAMUSCULAR
  Filled 2023-10-10: qty 1

## 2023-10-10 NOTE — ED Notes (Signed)
Pt continues to shout at staff. Disoriented place, time and situation at this time.

## 2023-10-10 NOTE — ED Notes (Signed)
Pt ambulated to bathroom with walker by security and sitter.

## 2023-10-10 NOTE — ED Notes (Signed)
Pt telling other staff that "we won't let her eat". RN reminded patient that she was provided breakfast tray that she declined, and again offered cereal. Pt declines. Pt states that brother is bringing her breakfast.

## 2023-10-10 NOTE — ED Notes (Addendum)
Pt ambulated back to bed with sitter and security present via walker. Verbal threats have subsided at this time. Will continue to monitor. Provided drink. Fall alarm verified to be working on [employer] bed.

## 2023-10-10 NOTE — ED Notes (Signed)
Pt up out of bed and ambulating with walker around ED. RN and [facility]SD attempted to verbally de-escalate patient effectively. Pt attempting to leave through EMS bay, additional security called. Pt states that she is leaving to find her brother, unable to reorient to current situation or time. Pt decided to throw walker as attempt to harm staff. Pt then stated she was going to fall, RN and security assisted patient to sit down on floor safely. Continued with attempts at verbal de-escalation failed by current staff. Keke, EDT able to establish good rapport with patient and pt allowed RN and other staff to assist her to standing and ambulate back to bed. Keke provided patient drink and verified that bed alarm on and functioning.

## 2023-10-10 NOTE — ED Notes (Signed)
Hospital meal provided.  100% consumed, pt tolerated w/o complaints.  Waste discarded appropriately.   

## 2023-10-10 NOTE — ED Notes (Signed)
Pt tells this tech that staff will not let her eat. Referring to previous notes, pt was offered breakfast and declined the offer.
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
## 2023-10-10 NOTE — ED Notes (Signed)
Pt given meal tray.

## 2023-10-10 NOTE — ED Notes (Signed)
Pt continues to yell and threaten staff. Pt states that she is going to walk out of hospital, "across the road to see the doctor". RN encouraged patient that we could have doctor see her here. Pt states that she needs her hair cut by the doctor. Unable to reorient patient to current situation. Pt presents as danger to self and other by threatening and disorientation of current situation. EDP S. Siadecki notified.

## 2023-10-10 NOTE — ED Notes (Signed)
Pt yelling, states she does not want breakfast provided. RN offered cereal, pt declines. States she doesn't want any breakfast.

## 2023-10-10 NOTE — ED Notes (Signed)
Pt would not allow RN to administer medications safely at this time. Pt continues to threaten staff. Manual hold to bilateral upper arms required for safe medication administration. IM medication given without any adverse events and then pt released from manual hold immediately. 1:1 sitter at bedside. Asencion Partridge, EDP notified.

## 2023-10-10 NOTE — ED Notes (Signed)
Breakfast placed at bedside, pt resting with equal rise and fall of chest. Color WNL

## 2023-10-10 NOTE — ED Provider Notes (Signed)
Emergency Medicine Observation Re-evaluation Note  Physical Exam   BP (!) 119/53 (BP Location: Left Arm)   Pulse 72   Temp (!) 96.7 F (35.9 C) (Axillary)   Resp 18   Ht 5\' 6"  (1.676 m)   Wt 90.7 kg   SpO2 94%   BMI 32.28 kg/m   Patient appears in no acute distress.  ED Course / MDM   No reported events during my shift at the time of this note.   Pt is awaiting dispo from consultants   Pilar Jarvis MD    Pilar Jarvis, MD 10/10/23 [phone]

## 2023-10-11 NOTE — ED Notes (Signed)
Pt ambulatory to restroom with front wheeled walker.

## 2023-10-11 NOTE — ED Notes (Addendum)
Pt up to nurses desk yelling, stating that she had a picture of her brothers on her bed yesterday and now its gone. Paper with names of brothers and numbers found near bed by security. Pt satisfied.

## 2023-10-11 NOTE — ED Notes (Signed)
Pt provided phone for phone call to brother.

## 2023-10-11 NOTE — ED Notes (Signed)
Pt became agitated and began walking around the ER looking for her brother (not present). Attempts to reorient pt were unsuccessful but pt agreed to return to her bed. RN called pt's brother Amada Jupiter at [phone] to allow pt to speak with him, and after she understood that he was not here at the hospital she quickly returned to baseline behaviors and laid down for a nap. Pt is currently sleeping in NAD, in full view of the nurse's station, with law enforcement nearby.

## 2023-10-11 NOTE — ED Provider Notes (Signed)
Emergency Medicine Observation Re-evaluation Note  Tracy Dickerson is a 83 y.o. female, currently boarding in the emergency department.  No acute events since last update. Physical Exam  BP 119/67 (BP Location: Right Arm)   Pulse 75   Temp 97.8 F (36.6 C) (Oral)   Resp 18   Ht 5\' 6"  (1.676 m)   Wt 90.7 kg   SpO2 95%   BMI 32.28 kg/m   ED Course / MDM   No recent lab work available for review. Plan  Current plan is for placement to an appropriate living facility once available.  Social worker is recommended patient to achieve this.    Minna Antis, MD 10/11/23 [phone]

## 2023-10-11 NOTE — ED Notes (Signed)
 Pt ate 90% of breakfast.

## 2023-10-11 NOTE — ED Notes (Signed)
Pt given breakfast tray at this time. 

## 2023-10-11 NOTE — ED Notes (Signed)
Patient is vol pending placement 

## 2023-10-11 NOTE — ED Provider Notes (Signed)
Emergency Medicine Observation Re-evaluation Note  Tracy Dickerson is a 83 y.o. female, seen on rounds today.  Pt initially presented to the ED for complaints of Agitation  Currently patient is resting comfortably without distress.  No agitation  Physical Exam  BP 139/60   Pulse 75   Temp 98 F (36.7 C) (Oral)   Resp 18   Ht 5\' 6"  (1.676 m)   Wt 90.7 kg   SpO2 98%   BMI 32.28 kg/m  Physical Exam Calm, sleeping without distress  ED Course / MDM    No major changes.  Calm at this time.  No new labs  Yesterday required manual hold for medication therapy Plan   Currently awaiting dispo from consultants   Sharyn Creamer, MD 10/11/23 [phone]

## 2023-10-11 NOTE — ED Notes (Signed)
Pt is currently on the phone with her brother. No needs expressed at this time.

## 2023-10-11 NOTE — ED Notes (Signed)
Pharmacy contacted for cortef.

## 2023-10-12 NOTE — ED Notes (Signed)
Breakfast provided.

## 2023-10-12 NOTE — ED Notes (Signed)
 Dinner tray provided; Pt tolerated well; Waste disposed of properly.

## 2023-10-12 NOTE — ED Notes (Signed)
Tracy Dickerson has repeatedly asked for more and more tape to tape up the phone number list she has on the wall. This tech has given Pt several pieces of tape. Pt agitated that this tech would not give her anymore tape at this time.

## 2023-10-12 NOTE — ED Notes (Signed)
Pt received lunch tray and drink.  

## 2023-10-12 NOTE — ED Notes (Signed)
Pt began walking around unit very angry stating it was this techs fault she couldn't call her brother. This tech began following Pt around unit to ensure her safety. Pt became very upset and began yelling and screaming at this tech for following her. Charge Nurse Morrie Sheldon reminded Pt of policy if she wanted to walk around unit. Pt remains upset, but has called and spoke to her brother.

## 2023-10-12 NOTE — ED Notes (Signed)
Patient up to restroom. Ambulated with walker to restroom and back to bed

## 2023-10-12 NOTE — ED Notes (Signed)
Patient eating icecream at this time on side of bed

## 2023-10-12 NOTE — ED Provider Notes (Signed)
-----------------------------------------   8:33 PM on 10/12/2023 -----------------------------------------   Blood pressure (!) 152/75, pulse 76, temperature 98.3 F (36.8 C), temperature source Oral, resp. rate 16, height 5\' 6"  (1.676 m), weight 90.7 kg, SpO2 93%.  The patient is calm and cooperative at this time.  There have been no acute events since the last update.  Awaiting disposition plan from case management/social work.    Corena Herter, MD 10/12/23 2034

## 2023-10-12 NOTE — ED Notes (Signed)
Vol?pending TOC Placement

## 2023-10-13 NOTE — Progress Notes (Signed)
   10/13/23 1530  Spiritual Encounters  Type of Visit Initial  Care provided to: Patient  Conversation partners present during encounter Nurse  Referral source Chaplain assessment  OnCall Visit No  Spiritual Framework  Presenting Themes Meaning/purpose/sources of inspiration;Community and relationships  Patient Stress Factors Other (Comment) (Waiting for somone to bring the patient a pair of shoes.)  Family Stress Factors None identified   Chaplain visited with the patient while rounding on the [facility] side of the main ED.  Chaplain provided empathy and a compassionate presence.  Chaplain will follow-up as patient/staff/family request.     Rev. Rana M. Burnell Blanks Div. Chaplain Resident [facility]

## 2023-10-13 NOTE — ED Provider Notes (Signed)
-----------------------------------------   4:04 AM on 10/13/2023 -----------------------------------------  Vitals:   10/11/23 0814 10/12/23 1514  BP: 119/67 (!) 152/75  Pulse: 75 76  Resp: 18 16  Temp: 97.8 F (36.6 C) 98.3 F (36.8 C)  SpO2: 95% 93%    Patient resting without complaint or discomfort throughout the shift.  She is interactive, converses with nursing staff and personnel without difficulty  The patient is calm and cooperative at this time.  There have been no acute events since the last update.  Awaiting disposition plan from case management/social work.      Sharyn Creamer, MD 10/13/23 [phone]

## 2023-10-13 NOTE — ED Notes (Signed)
Pt becoming agitated about not being able to get hair cut. Begins yelling at this RN and demanding to ask other staff members down the hall to cut her hair.

## 2023-10-13 NOTE — ED Notes (Signed)
 Pt given snack and beverage.

## 2023-10-13 NOTE — ED Notes (Signed)
Hospital meal provided, pt tolerated w/o complaints.  Waste discarded appropriately.  

## 2023-10-13 NOTE — ED Notes (Signed)
Pt's bed was wiped down and changed.

## 2023-10-13 NOTE — TOC Progression Note (Signed)
Transition of Care [facility]) - Progression Note    Patient Details  Name: Tracy Dickerson MRN: [id number] Date of Birth: Jan 17, 1941  [facility]) CM/SW Contact  Tory Emerald, [place] Phone Number: 10/13/2023, 12:12 PM  Clinical Narrative:     CSW emailed [name], DSS GOP regarding long term placement assistance for pt and barriers the hospital is running into regarding placement. CSW asked for assistance with [facility]s.   CSW attempted to contact [facility] to discuss memory care and left a vm asking for a returned call.   Expected Discharge Plan: Long Term Nursing Home Barriers to Discharge: Continued Medical Work up  Expected Discharge Plan and Services In-house Referral: Clinical Social Work Discharge Planning Services: NA Post Acute Care Choice: Skilled Nursing Facility Living arrangements for the past 2 months: Single Family Home (Unsafe to return)                   DME Agency: NA     Representative spoke with at DME Agency: N/A HH Arranged: NA           Social Determinants of Health (SDOH) Interventions SDOH Screenings   Food Insecurity: No Food Insecurity (08/21/2023)  Housing: Medium Risk (08/21/2023)  Transportation Needs: No Transportation Needs (08/21/2023)  Utilities: Not At Risk (08/21/2023)  Financial Resource Strain: Low Risk  (02/04/2023)   Received from [facility], [facility]  Physical Activity: Inactive (07/16/2021)   Received from [facility], [facility]  Stress: No Stress Concern Present (07/16/2021)   Received from [facility], [facility]  Tobacco Use: Medium Risk (09/04/2023)  Health Literacy: Medium Risk (07/16/2021)   Received from [facility], [facility]    Readmission Risk Interventions     No data to display

## 2023-10-14 MED ORDER — HALOPERIDOL LACTATE 5 MG/ML IJ SOLN
2.5000 mg | Freq: Once | INTRAMUSCULAR | Status: AC
Start: 2023-10-14 — End: 2023-10-14
  Administered 2023-10-14: 2.5 mg via INTRAMUSCULAR
  Filled 2023-10-14: qty 1

## 2023-10-14 MED ORDER — LORAZEPAM 2 MG/ML IJ SOLN
1.0000 mg | Freq: Once | INTRAMUSCULAR | Status: AC
  Administered 2023-10-14: 1 mg via INTRAMUSCULAR
  Filled 2023-10-14: qty 1

## 2023-10-14 NOTE — ED Notes (Signed)
Pt given PM snack at this time.  

## 2023-10-14 NOTE — ED Notes (Signed)
Pt up and walking away from treatment area through restricted access. Cursing at this RN, unable to be reoriented. Security called. Pt threw walker. Verbal de-escalation attempted by security with minimal success. EDP G. Derrill Kay notified.
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
## 2023-10-14 NOTE — ED Notes (Signed)
Patient allowed this RN to give medication without manual hold required. Security remains at bedside with patient and talking with patient at this time.

## 2023-10-14 NOTE — ED Provider Notes (Signed)
-----------------------------------------   6:31 AM on 10/14/2023 -----------------------------------------   Blood pressure (!) 155/75, pulse 72, temperature 98.1 F (36.7 C), resp. rate 18, height 5\' 6"  (1.676 m), weight 90.7 kg, SpO2 96%.  The patient is calm and cooperative at this time.  There have been no acute events since the last update.  Awaiting disposition plan from Social Work team.   Irean Hong, MD 10/14/23 [phone]

## 2023-10-14 NOTE — ED Notes (Signed)
Mobility specialist worked with patient. Pt now sitting in bed, eating.  75% consumed, pt tolerated w/o complaints.

## 2023-10-14 NOTE — ED Notes (Signed)
Pt ambulated to bathroom to preform ADL's no assistance required 

## 2023-10-14 NOTE — ED Notes (Signed)
Hospital meal provided.  100% consumed, pt tolerated w/o complaints.  Waste discarded appropriately.   

## 2023-10-14 NOTE — ED Notes (Signed)
Pt ambulating down hallway with walker, security attempted to reoriented patient. Pt cursing at this RN, accusing staff of harming her.

## 2023-10-14 NOTE — ED Notes (Addendum)
Pt trying to leave facility, states that she is going outside to go get a hair cut. Unable to reoriented pt to current situation. Security and [employer] at side. Pt tossed walker. Pt continues to curse at staff and mood is irritable. Pt sat down in bed at this time. Fall alarm on bed. Pt states that she will refuse to eat.

## 2023-10-14 NOTE — TOC Progression Note (Addendum)
Transition of Care [facility]) - Progression Note    Patient Details  Name: Tracy Dickerson MRN: [id number] Date of Birth: 04-17-1941  Transition of Care [facility]) CM/SW Contact  Tory Emerald, [place] Phone Number: 10/14/2023, 9:12 AM  Clinical Narrative:     CSW received a call from Tammy at [facility] asking to meet with pt at 2; CSW agreed and will meet Tammy to escort her to meet pt. CSW emailed DSS Nia Manson Passey to make her aware of upcoming meeting with Tammy and pt.   CSW met with Tammy at bedside with pt. Pt was sleeping and could not be aroused. RN confirmed pt did receive medication due to earlier behaviors. Tammy stated she can not admit pt due to behaviors and requiring sedation because they are not a locked unit. CSW contact Nia to get clarification on GOP and decision makers. Nia reports DSS is pursing guardianship, but brothers are still decision makers at this time. CSW informed Nia pt needs to be placed, as the hall way in the ED is not a safe plan, as pt is more susceptible to viruses and other diseases that are easily spread in hospital settings. CSW asked about family care homes that DSS works with. Nia stated she will speak with her supervisor regarding any availabilities with facilities they work with and f/u with CSW.   Expected Discharge Plan: Long Term Nursing Home Barriers to Discharge: Continued Medical Work up  Expected Discharge Plan and Services In-house Referral: Clinical Social Work Discharge Planning Services: NA Post Acute Care Choice: Skilled Nursing Facility Living arrangements for the past 2 months: Single Family Home (Unsafe to return)                   DME Agency: NA     Representative spoke with at DME Agency: N/A HH Arranged: NA           Social Determinants of Health (SDOH) Interventions SDOH Screenings   Food Insecurity: No Food Insecurity (08/21/2023)  Housing: Medium Risk (08/21/2023)  Transportation Needs: No Transportation Needs (08/21/2023)   Utilities: Not At Risk (08/21/2023)  Financial Resource Strain: Low Risk  (02/04/2023)   Received from [facility], [facility]  Physical Activity: Inactive (07/16/2021)   Received from [facility], [facility]  Stress: No Stress Concern Present (07/16/2021)   Received from [facility], [facility]  Tobacco Use: Medium Risk (09/04/2023)  Health Literacy: Medium Risk (07/16/2021)   Received from [facility], [facility]    Readmission Risk Interventions     No data to display

## 2023-10-14 NOTE — ED Notes (Signed)
Vol/toc.. 

## 2023-10-14 NOTE — ED Notes (Addendum)
Pt ambulated to and from bathroom independently. RN followed behind due to high risk fall and recently medicated. Pt cursing at this RN . Pt threw walker and independently walked into bathroom. Independently used restroom, picked up walker and ambulated back to room independently.

## 2023-10-14 NOTE — Progress Notes (Signed)
Mobility Specialist - Progress Note     10/14/23 1159  Mobility  Activity Stood at bedside  Level of Assistance Standby assist, set-up cues, supervision of patient - no hands on  Assistive Device Front wheel walker  Distance Ambulated (ft) 250 ft  Range of Motion/Exercises Active  Activity Response Tolerated well  Mobility Referral Yes  Mobility visit 1 Mobility   Pt resting in bed on RA upon entry. Pt STS and ambulates to hallway around ED department SBA to RW. Pt calm and agreeable to participate throughout session. Pt returned to bed and left EOB set up with lunch. Pt bed alarm activated.   Johnathan Hausen Mobility Specialist 10/14/23, 12:07 PM

## 2023-10-14 NOTE — ED Notes (Signed)
Pt ambulated to bathroom to preform ADL's no assistance required. Used front wheel walker.

## 2023-10-15 MED ORDER — LORAZEPAM 1 MG PO TABS
1.0000 mg | ORAL_TABLET | Freq: Once | ORAL | Status: AC
  Administered 2023-10-15: 1 mg via ORAL
  Filled 2023-10-15: qty 1

## 2023-10-15 NOTE — ED Notes (Signed)
 vol/toc.Marland KitchenMarland KitchenMarland Kitchen

## 2023-10-15 NOTE — ED Provider Notes (Signed)
-----------------------------------------   6:03 AM on 10/15/2023 -----------------------------------------   Blood pressure 104/63, pulse 65, temperature 98.5 F (36.9 C), temperature source Oral, resp. rate 16, height 5\' 6"  (1.676 m), weight 90.7 kg, SpO2 98%.  The patient is calm and cooperative at this time.  There have been no acute events since the last update.  Awaiting disposition plan from Social Work team.   Irean Hong, MD 10/15/23 [phone]

## 2023-10-15 NOTE — ED Notes (Signed)
Pt becoming agitated, walking into hydration station. Dr Larinda Buttery notified, meds ordered

## 2023-10-16 NOTE — ED Notes (Signed)
BH safe lunch tray given.

## 2023-10-16 NOTE — ED Notes (Signed)
Pt ambulated to a shower. Pericare applied with shower, no event.

## 2023-10-16 NOTE — ED Notes (Signed)
Declines snack snack at this time Pt wants to sleep.

## 2023-10-16 NOTE — ED Notes (Signed)
Pt noted to have multiple piles of trash along the side of her bed and the wall, cups, cracker wrappers, ice cream cups and napkins were all cleaned up from underneath bed.

## 2023-10-16 NOTE — ED Notes (Signed)
 house at bedside to visit with Tracy Dickerson for placement option.

## 2023-10-16 NOTE — ED Notes (Signed)
BH Breakfast tray given.

## 2023-10-16 NOTE — ED Notes (Signed)
BH dinner tray given.

## 2023-10-16 NOTE — ED Notes (Signed)
Pt given PM snack and night meds, denies any further needs. Trash disposed in trash can

## 2023-10-16 NOTE — ED Notes (Addendum)
Pt observed getting up to go to the bathroom via walker, pt had urinalysis ordered due to previous shift RN noticing foul smelling odor.  As I walked with the patient to the bathroom with the hat and urine cup, pt refused to use the bathroom became agitated and unable to redirect. Pt repeatedly stating she was not going to do that and that she has never done that before - referring to urinating in hat. Pt continued to argue with staff and left bathroom and threatened to piss on herself. As patient walked back to her room she threw her walker x 2 in the hall.   A few minutes later patient got up again and started walking around then ended up in the bathroom and threw the hat out of the toilet and proceeded to urinate in the toilet without the hat.   Informed Dr. Larinda Buttery that pt is agitated and being uncooperative with urine sample despite trying to use hat. At this time Dr. Larinda Buttery did not want to obtain urine via catheter and to try again when patient is more cooperative.

## 2023-10-16 NOTE — ED Provider Notes (Signed)
-----------------------------------------   5:25 AM on 10/16/2023 -----------------------------------------   Blood pressure (!) 154/88, pulse 90, temperature 97.6 F (36.4 C), temperature source Oral, resp. rate 20, height 5\' 6"  (1.676 m), weight 90.7 kg, SpO2 97%.  The patient is calm and cooperative at this time.  There have been no acute events since the last update.  Awaiting disposition plan from Social Work team.   Irean Hong, MD 10/16/23 [phone]

## 2023-10-16 NOTE — ED Notes (Signed)
Pt ambulating to BR °

## 2023-10-16 NOTE — ED Notes (Signed)
Head of bed adjusted at patient's request

## 2023-10-16 NOTE — ED Notes (Signed)
RN notified by LCSW that a representative from [facility] would be coming to visit Ms. Sharol around 1530-1600 for placement discussion. This RN notified Ms. Malachi Bonds.

## 2023-10-16 NOTE — TOC Progression Note (Signed)
Transition of Care [facility]) - Progression Note    Patient Details  Name: Tracy Dickerson MRN: [id number] Date of Birth: 04-03-1941  Transition of Care [facility]) CM/SW Contact  Tory Emerald, [place] Phone Number: 10/16/2023, 2:24 PM  Clinical Narrative:     CSW received notification from [name] at [facility] stating [facility] will be here today between 330pm and 4pm to assess pt. CSW informed RN.   Expected Discharge Plan: Long Term Nursing Home Barriers to Discharge: Continued Medical Work up  Expected Discharge Plan and Services In-house Referral: Clinical Social Work Discharge Planning Services: NA Post Acute Care Choice: Skilled Nursing Facility Living arrangements for the past 2 months: Single Family Home (Unsafe to return)                   DME Agency: NA     Representative spoke with at DME Agency: N/A HH Arranged: NA           Social Determinants of Health (SDOH) Interventions SDOH Screenings   Food Insecurity: No Food Insecurity (08/21/2023)  Housing: Medium Risk (08/21/2023)  Transportation Needs: No Transportation Needs (08/21/2023)  Utilities: Not At Risk (08/21/2023)  Financial Resource Strain: Low Risk  (02/04/2023)   Received from [facility], [facility]  Physical Activity: Inactive (07/16/2021)   Received from [facility], [facility]  Stress: No Stress Concern Present (07/16/2021)   Received from [facility], [facility]  Tobacco Use: Medium Risk (09/04/2023)  Health Literacy: Medium Risk (07/16/2021)   Received from [facility], [facility]    Readmission Risk Interventions     No data to display

## 2023-10-17 NOTE — ED Provider Notes (Signed)
[facility] Observation Note   ----------------------------------------- 8:36 AM on 10/17/2023 -----------------------------------------  Tracy Dickerson is a 83 y.o. female currently boarding in the Emergency Department.  No acute events since last update.  Recent Vitals   Most recent vital signs: Vitals:   10/15/23 1912 10/16/23 0953  BP: (!) 154/88 (!) 126/59  Pulse: 90 95  Resp: 20 20  Temp: 97.6 F (36.4 C) 98.1 F (36.7 C)  SpO2: 97% 100%    ED Results / Procedures / Treatments   Labs (all labs ordered are listed, but only abnormal results are displayed) Labs Reviewed  BASIC METABOLIC PANEL - Abnormal; Notable for the following components:      Result Value   Glucose, Bld 128 (*)    BUN 26 (*)    Creatinine, Ser 1.20 (*)    GFR, Estimated 46 (*)    All other components within normal limits  CBC WITH DIFFERENTIAL/PLATELET - Abnormal; Notable for the following components:   RBC 5.18 (*)    MCH 25.5 (*)    RDW 21.7 (*)    All other components within normal limits  URINALYSIS, ROUTINE W REFLEX MICROSCOPIC - Abnormal; Notable for the following components:   Color, Urine YELLOW (*)    APPearance CLEAR (*)    All other components within normal limits  CBG MONITORING, ED - Abnormal; Notable for the following components:   Glucose-Capillary 113 (*)    All other components within normal limits  CBG MONITORING, ED - Abnormal; Notable for the following components:   Glucose-Capillary 147 (*)    All other components within normal limits  CBG MONITORING, ED - Abnormal; Notable for the following components:   Glucose-Capillary 144 (*)    All other components within normal limits  CBG MONITORING, ED - Abnormal; Notable for the following components:   Glucose-Capillary 113 (*)    All other components within normal limits  CBG MONITORING, ED - Abnormal; Notable for the following components:   Glucose-Capillary 148 (*)    All other components  within normal limits  CBG MONITORING, ED - Abnormal; Notable for the following components:   Glucose-Capillary 69 (*)    All other components within normal limits  CBG MONITORING, ED - Abnormal; Notable for the following components:   Glucose-Capillary 131 (*)    All other components within normal limits  URINALYSIS, ROUTINE W REFLEX MICROSCOPIC  CBG MONITORING, ED  CBG MONITORING, ED  CBG MONITORING, ED  CBG MONITORING, ED  CBG MONITORING, ED  CBG MONITORING, ED  CBG MONITORING, ED  CBG MONITORING, ED  CBG MONITORING, ED  CBG MONITORING, ED  CBG MONITORING, ED  CBG MONITORING, ED  CBG MONITORING, ED  CBG MONITORING, ED  CBG MONITORING, ED  CBG MONITORING, ED  CBG MONITORING, ED  CBG MONITORING, ED  CBG MONITORING, ED  CBG MONITORING, ED  CBG MONITORING, ED  CBG MONITORING, ED  CBG MONITORING, ED    MEDICATIONS ORDERED IN ED: Medications  acetaminophen (TYLENOL) tablet 1,000 mg (1,000 mg Oral Given 10/16/23 2133)  albuterol (VENTOLIN HFA) 108 (90 Base) MCG/ACT inhaler 2 puff (has no administration in time range)  acetaminophen (TYLENOL) tablet 500 mg (500 mg Oral Given 10/12/23 2135)  atorvastatin (LIPITOR) tablet 20 mg (20 mg Oral Given 10/16/23 0911)  apixaban (ELIQUIS) tablet 5 mg (5 mg Oral Given 10/16/23 2131)  bisacodyl (DULCOLAX) suppository 10 mg (has no administration in time range)  gabapentin (NEURONTIN) capsule 600 mg (600 mg Oral Given 10/16/23  2131)  hydrocortisone (CORTEF) tablet 5 mg (5 mg Oral Given 10/16/23 0911)  hydrOXYzine (ATARAX) tablet 10 mg (has no administration in time range)  pantoprazole (PROTONIX) EC tablet 40 mg (40 mg Oral Given 10/16/23 2131)  ondansetron (ZOFRAN-ODT) disintegrating tablet 4 mg (has no administration in time range)  melatonin tablet 5 mg (5 mg Oral Given 10/16/23 2131)  haloperidol lactate (HALDOL) injection 5 mg (5 mg Intramuscular Given 09/05/23 0715)  lidocaine (XYLOCAINE) 2 % viscous mouth solution 15 mL (15 mLs  Mouth/Throat Given 09/14/23 1848)  aspirin tablet 325 mg (325 mg Oral Given 10/02/23 2216)  haloperidol lactate (HALDOL) injection 2.5 mg (2.5 mg Intramuscular Given 10/10/23 1037)  LORazepam (ATIVAN) injection 1 mg (1 mg Intramuscular Given 10/10/23 1037)  haloperidol lactate (HALDOL) injection 2.5 mg (2.5 mg Intramuscular Given 10/14/23 1054)  LORazepam (ATIVAN) injection 1 mg (1 mg Intramuscular Given 10/14/23 1055)  LORazepam (ATIVAN) tablet 1 mg (1 mg Oral Given 10/15/23 1450)     ED Plan   Currently awaiting placement into an appropriate living facility.  Social work is working with the patient to help achieve this.      Minna Antis, MD 10/17/23 [phone]

## 2023-10-17 NOTE — ED Notes (Signed)
This NT provided pt with pm snack.

## 2023-10-17 NOTE — ED Notes (Signed)
Pt ambulated to restroom with walker. Upon exiting restroom pt went to nurses station pt walked up to another nurse and said that she wanted her hair cut. When this nurse attempted to redirect pt she yelled and said to get out of her way. Pt escorted back to her stretcher in the hallway while she continued to talk loudly about needing her hair cut.

## 2023-10-17 NOTE — Progress Notes (Signed)
Mobility Specialist - Progress Note     10/17/23 1503  Mobility  Activity Stood at bedside;Ambulated with assistance in hallway  Level of Assistance Standby assist, set-up cues, supervision of patient - no hands on  Assistive Device Front wheel walker  Distance Ambulated (ft) 320 ft  Range of Motion/Exercises Active  Activity Response Tolerated well  Mobility Referral Yes  Mobility visit 1 Mobility  Mobility Specialist Start Time (ACUTE ONLY) 1443  Mobility Specialist Stop Time (ACUTE ONLY) 1502  Mobility Specialist Time Calculation (min) (ACUTE ONLY) 19 min   Pt resting in bed on RA upon entry. Pt STS and ambulates to hallway SBA with RW. Pt returned to bed and left with needs in reach.   Johnathan Hausen Mobility Specialist 10/17/23, 3:10 PM

## 2023-10-17 NOTE — TOC Progression Note (Addendum)
Transition of Care [facility]) - Progression Note    Patient Details  Name: Tracy Dickerson MRN: [id number] Date of Birth: 10-Oct-1940  Transition [facility] Care [facility]) CM/SW Contact  Margarito Liner, LCSW Phone Number: 10/17/2023, 11:57 AM  Clinical Narrative:   Unable to reach [facility] staff member by phone. CSW emailed the Librarian, academic to follow up on yesterday's assessment.  1:51 pm: CSW sent requested clinicals to [employer].  Expected Discharge Plan: Long Term Nursing Home Barriers to Discharge: Continued Medical Work up  Expected Discharge Plan and Services In-house Referral: Clinical Social Work Discharge Planning Services: NA Post Acute Care Choice: Skilled Nursing Facility Living arrangements for the past 2 months: Single Family Home (Unsafe to return)                   DME Agency: NA     Representative spoke with at DME Agency: N/A HH Arranged: NA           Social Determinants of Health (SDOH) Interventions SDOH Screenings   Food Insecurity: No Food Insecurity (08/21/2023)  Housing: Medium Risk (08/21/2023)  Transportation Needs: No Transportation Needs (08/21/2023)  Utilities: Not At Risk (08/21/2023)  Financial Resource Strain: Low Risk  (02/04/2023)   [facility] from [facility], [facility]  Physical Activity: Inactive (07/16/2021)   Received from [facility], [facility]  Stress: No Stress Concern Present (07/16/2021)   Received from [facility], [facility]  Tobacco Use: Medium Risk (09/04/2023)  Health Literacy: Medium Risk (07/16/2021)   [facility] from [facility], [facility]    Readmission Risk Interventions     No data to display

## 2023-10-17 NOTE — ED Notes (Addendum)
Attempted to catch pt urine sample in hat. Pt becomes immediately verbally aggressive towards RN. RN explained that her behaviors inappropriate and will not be tolerated.  Urine sample was not obtained.

## 2023-10-17 NOTE — ED Notes (Signed)
Pt attempting to ambulate around ED, attempted to verbally redirect pt with security at side to which she became verbally aggressive. RN instructed pt that this way of speaking to staff is not appropriate. Pt back to her bed at this time. Pt took her meal tray and disposed of it without eating.

## 2023-10-17 NOTE — ED Notes (Signed)
VOL TOc  Tracy Dickerson NOT ALLOWED to visit due to aggressive behaviors

## 2023-10-17 NOTE — ED Notes (Signed)
Lunch provided to patient. Encouraged patient to awaken and eat, pt shrugs shoulders and dozes back off to resting. Will continue to monitor.

## 2023-10-17 NOTE — ED Notes (Signed)
Breakfast provided to patient

## 2023-10-18 MED ORDER — HALOPERIDOL LACTATE 5 MG/ML IJ SOLN
2.5000 mg | Freq: Four times a day (QID) | INTRAMUSCULAR | Status: DC | PRN
  Administered 2023-10-18 – 2023-11-11 (×7): 2.5 mg via INTRAMUSCULAR
  Filled 2023-10-18 (×8): qty 1

## 2023-10-18 MED ORDER — LORAZEPAM 2 MG/ML IJ SOLN
1.0000 mg | Freq: Once | INTRAMUSCULAR | Status: AC
  Administered 2023-10-18: 1 mg via INTRAMUSCULAR
  Filled 2023-10-18: qty 1

## 2023-10-18 NOTE — ED Provider Notes (Signed)
Emergency Medicine Observation Re-evaluation Note  Physical Exam   BP (!) 143/70 (BP Location: Left Arm)   Pulse 87   Temp 97.8 F (36.6 C) (Oral)   Resp 18   Ht 5\' 6"  (1.676 m)   Wt 90.7 kg   SpO2 96%   BMI 32.28 kg/m   Patient appears in no acute distress.  ED Course / MDM   No reported events during my shift at the time of this note.   Pt is awaiting dispo from consultants   Pilar Jarvis MD    Pilar Jarvis, MD 10/18/23 [phone]

## 2023-10-18 NOTE — ED Notes (Signed)
Pt. Is an angry because she wants a bra, explained to pt. We do not have bras in the ER, attempting to leave ER and refuses to go back to bed, pt. Yelling and attempting to hit staff with walker, MD made aware

## 2023-10-18 NOTE — Progress Notes (Signed)
Mobility Specialist - Progress Not     10/18/23 1245  Mobility  Activity Ambulated with assistance in hallway;Stood at bedside  Level of Assistance Standby assist, set-up cues, supervision of patient - no hands on  Assistive Device Front wheel walker  Distance Ambulated (ft) 350 ft  Range of Motion/Exercises Active  Activity Response Tolerated well  Mobility Referral Yes  Mobility visit 1 Mobility  Mobility Specialist Start Time (ACUTE ONLY) 1222  Mobility Specialist Stop Time (ACUTE ONLY) 1240  Mobility Specialist Time Calculation (min) (ACUTE ONLY) 18 min   Pt resting in bed on RA upon entry. Pt STS and and ambulates to hallway around NS SBA with RW. Pt endorse 2/10 pain in legs but patient said she is happy to go for a walk. Pt returned to bed and left with needs in reach.    Johnathan Hausen Mobility Specialist 10/18/23, 12:52 PM

## 2023-10-18 NOTE — ED Notes (Signed)
Pt resting in bed comfortably. Rise and fall of chest noted.

## 2023-10-18 NOTE — ED Notes (Signed)
This RN made pt a bra out of mesh panties, pt. Refused bra and called this RN a "bitch and a frog" and told this RN to "stick the damn bra where the sun don't shine", bra laid on bed incase pt. Wants it later

## 2023-10-19 NOTE — ED Provider Notes (Signed)
-----------------------------------------   6:19 AM on 10/19/2023 -----------------------------------------   Blood pressure 107/68, pulse 69, temperature 97.9 F (36.6 C), temperature source Oral, resp. rate 16, height 1.676 m (5\' 6" ), weight 90.7 kg, SpO2 97%.  The patient is calm and cooperative at this time.  There have been no acute events since the last update.  Awaiting disposition plan from [facility] team.   Loleta Rose, MD 10/19/23 [phone]

## 2023-10-19 NOTE — ED Provider Notes (Signed)
[facility] Observation Note   ----------------------------------------- 2:44 PM on 10/19/2023 -----------------------------------------  Tracy Dickerson is a 83 y.o. female currently boarding in the Emergency Department.  No acute events since last update.  Recent Vitals   Most recent vital signs: Vitals:   10/18/23 0924 10/19/23 1428  BP: 107/68 129/64  Pulse: 69 68  Resp: 16 17  Temp: 97.9 F (36.6 C) 98 F (36.7 C)  SpO2: 97% 94%    ED Results / Procedures / Treatments   Labs (all labs ordered are listed, but only abnormal results are displayed) Labs Reviewed  BASIC METABOLIC PANEL - Abnormal; Notable for the following components:      Result Value   Glucose, Bld 128 (*)    BUN 26 (*)    Creatinine, Ser 1.20 (*)    GFR, Estimated 46 (*)    All other components within normal limits  CBC WITH DIFFERENTIAL/PLATELET - Abnormal; Notable for the following components:   RBC 5.18 (*)    MCH 25.5 (*)    RDW 21.7 (*)    All other components within normal limits  URINALYSIS, ROUTINE W REFLEX MICROSCOPIC - Abnormal; Notable for the following components:   Color, Urine YELLOW (*)    APPearance CLEAR (*)    All other components within normal limits  CBG MONITORING, ED - Abnormal; Notable for the following components:   Glucose-Capillary 113 (*)    All other components within normal limits  CBG MONITORING, ED - Abnormal; Notable for the following components:   Glucose-Capillary 147 (*)    All other components within normal limits  CBG MONITORING, ED - Abnormal; Notable for the following components:   Glucose-Capillary 144 (*)    All other components within normal limits  CBG MONITORING, ED - Abnormal; Notable for the following components:   Glucose-Capillary 113 (*)    All other components within normal limits  CBG MONITORING, ED - Abnormal; Notable for the following components:   Glucose-Capillary 148 (*)    All other components within normal  limits  CBG MONITORING, ED - Abnormal; Notable for the following components:   Glucose-Capillary 69 (*)    All other components within normal limits  CBG MONITORING, ED - Abnormal; Notable for the following components:   Glucose-Capillary 131 (*)    All other components within normal limits  URINALYSIS, ROUTINE W REFLEX MICROSCOPIC  CBG MONITORING, ED  CBG MONITORING, ED  CBG MONITORING, ED  CBG MONITORING, ED  CBG MONITORING, ED  CBG MONITORING, ED  CBG MONITORING, ED  CBG MONITORING, ED  CBG MONITORING, ED  CBG MONITORING, ED  CBG MONITORING, ED  CBG MONITORING, ED  CBG MONITORING, ED  CBG MONITORING, ED  CBG MONITORING, ED  CBG MONITORING, ED  CBG MONITORING, ED  CBG MONITORING, ED  CBG MONITORING, ED  CBG MONITORING, ED  CBG MONITORING, ED  CBG MONITORING, ED  CBG MONITORING, ED    MEDICATIONS ORDERED IN ED: Medications  acetaminophen (TYLENOL) tablet 1,000 mg (1,000 mg Oral Given 10/18/23 0014)  albuterol (VENTOLIN HFA) 108 (90 Base) MCG/ACT inhaler 2 puff (has no administration in time range)  acetaminophen (TYLENOL) tablet 500 mg (500 mg Oral Given 10/12/23 2135)  atorvastatin (LIPITOR) tablet 20 mg (20 mg Oral Given 10/19/23 1140)  apixaban (ELIQUIS) tablet 5 mg (5 mg Oral Given 10/19/23 1140)  bisacodyl (DULCOLAX) suppository 10 mg (has no administration in time range)  gabapentin (NEURONTIN) capsule 600 mg (600 mg Oral Given 10/18/23 2103)  hydrocortisone (CORTEF) tablet 5 mg (5 mg Oral Given 10/19/23 1140)  hydrOXYzine (ATARAX) tablet 10 mg (has no administration in time range)  pantoprazole (PROTONIX) EC tablet 40 mg (40 mg Oral Given 10/19/23 1140)  ondansetron (ZOFRAN-ODT) disintegrating tablet 4 mg (4 mg Oral Given 10/18/23 0014)  melatonin tablet 5 mg (5 mg Oral Given 10/18/23 2103)  haloperidol lactate (HALDOL) injection 2.5 mg (2.5 mg Intramuscular Given 10/18/23 1552)  haloperidol lactate (HALDOL) injection 5 mg (5 mg Intramuscular Given 09/05/23 0715)   lidocaine (XYLOCAINE) 2 % viscous mouth solution 15 mL (15 mLs Mouth/Throat Given 09/14/23 1848)  aspirin tablet 325 mg (325 mg Oral Given 10/02/23 2216)  haloperidol lactate (HALDOL) injection 2.5 mg (2.5 mg Intramuscular Given 10/10/23 1037)  LORazepam (ATIVAN) injection 1 mg (1 mg Intramuscular Given 10/10/23 1037)  haloperidol lactate (HALDOL) injection 2.5 mg (2.5 mg Intramuscular Given 10/14/23 1054)  LORazepam (ATIVAN) injection 1 mg (1 mg Intramuscular Given 10/14/23 1055)  LORazepam (ATIVAN) tablet 1 mg (1 mg Oral Given 10/15/23 1450)  LORazepam (ATIVAN) injection 1 mg (1 mg Intramuscular Given 10/18/23 1552)     ED Plan   Currently awaiting placement into an appropriate living facility.  Social work is working with the patient to help achieve this.      Minna Antis, MD 10/19/23 1444

## 2023-10-19 NOTE — Progress Notes (Signed)
Mobility Specialist - Progress Note     10/19/23 1700  Mobility  Activity Ambulated independently in hallway  Level of Assistance Modified independent, requires aide device or extra time  Assistive Device Front wheel walker  Distance Ambulated (ft) 350 ft  Range of Motion/Exercises Active  Activity Response Tolerated well  Mobility Referral Yes  Mobility visit 1 Mobility   Pt resting in bed on RA upon entry. Pt STS and ambulates to hallway around ModI with RW. Pt returned to bed and left with needs in reach.    Johnathan Hausen Mobility Specialist 10/19/23, 5:16 PM

## 2023-10-19 NOTE — ED Notes (Signed)
VOL/pending TOC placement

## 2023-10-19 NOTE — ED Notes (Signed)
Pt provided with breakfast tray. Pt currently asleep, tray left at bedside.

## 2023-10-19 NOTE — ED Notes (Signed)
Pt provided with dinner tray. Pt sitting up eating

## 2023-10-20 NOTE — ED Notes (Signed)
Patient dinner tray discarded at this time. 100% of meal consumed.

## 2023-10-20 NOTE — ED Notes (Signed)
Pt ate 100% of meal.

## 2023-10-20 NOTE — ED Provider Notes (Signed)
-----------------------------------------   5:58 AM on 10/20/2023 -----------------------------------------   Blood pressure 124/70, pulse 70, temperature 98.3 F (36.8 C), temperature source Oral, resp. rate 18, height 5\' 6"  (1.676 m), weight 90.7 kg, SpO2 96%.  The patient is calm and cooperative at this time.  There have been no acute events since the last update.  Awaiting disposition plan from Social Work team.   Irean Hong, MD 10/20/23 [phone]

## 2023-10-20 NOTE — ED Notes (Signed)
Pt ambulated to bathroom to preform ADL's no assistance required Refusing urine sample, becomes irritated when asked.

## 2023-10-20 NOTE — ED Notes (Signed)
Breakfast placed at bedside, pt sleeping. ?

## 2023-10-20 NOTE — ED Notes (Signed)
Snack placed at bedside.

## 2023-10-20 NOTE — ED Notes (Signed)
Patient given dinner tray at this time.

## 2023-10-20 NOTE — TOC Progression Note (Addendum)
Transition of Care [facility]) - Progression Note    Patient Details  Name: Tracy Dickerson MRN: [id number] Date of Birth: [birth year]  Transition of Care [facility]) CM/SW Contact  Tory Emerald, [place] Phone Number: 10/20/2023, 5:47 PM  Clinical Narrative:     CSW emailed Rica Mote at [facility] to follow up on email sent by colleague, Maralyn Sago for Sara's review.   Update: late note entry: CSW conversed with Rica Mote via phone to discuss admission status. [facility] reports she will not be able to take pt due to having an aggressive pt on the unit that will not be safe for opt. [facility] states this is per DSS, Nia, and [facility] has no other beds where she can make pt changes. CSW reached out to Nia to discuss other placement options and she reports she is going to follow up with her supervisor and fax back out.   Expected Discharge Plan: Long Term Nursing Home Barriers to Discharge: Continued Medical Work up  Expected Discharge Plan and Services In-house Referral: Clinical Social Work Discharge Planning Services: NA Post Acute Care Choice: Skilled Nursing Facility Living arrangements for the past 2 months: Single Family Home (Unsafe to return)                   DME Agency: NA     Representative spoke with at DME Agency: N/A HH Arranged: NA           Social Determinants of Health (SDOH) Interventions SDOH Screenings   Food Insecurity: No Food Insecurity (08/21/2023)  Housing: Medium Risk (08/21/2023)  Transportation Needs: No Transportation Needs (08/21/2023)  Utilities: Not At Risk (08/21/2023)  Financial Resource Strain: Low Risk  (02/04/2023)   Received from [facility], [facility]  Physical Activity: Inactive (07/16/2021)   Received from [facility], [facility]  Stress: No Stress Concern Present (07/16/2021)   Received from [facility], [facility]  Tobacco Use: Medium Risk (09/04/2023)  Health Literacy: Medium Risk (07/16/2021)   Received from [facility]    Readmission Risk Interventions     No data to display

## 2023-10-20 NOTE — ED Notes (Signed)
Hospital meal provided.  100% consumed, pt tolerated w/o complaints.  Waste discarded appropriately.
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
## 2023-10-21 NOTE — ED Notes (Signed)
Pt moved in bed from 19H, staff explained to pt that this may be a temporary move, Tracy Dickerson verbalized understanding. Cont to monitor as needed

## 2023-10-21 NOTE — ED Notes (Signed)
Mr Winborne continues to present at baseline.  Calm, and compliant with medications. Cont to monitor as ordered.

## 2023-10-21 NOTE — ED Provider Notes (Signed)
-----------------------------------------   5:43 AM on 10/21/2023 -----------------------------------------   Blood pressure (!) 166/71, pulse 83, temperature 98.2 F (36.8 C), temperature source Oral, resp. rate 17, height 5\' 6"  (1.676 m), weight 90.7 kg, SpO2 100%.  The patient is calm and cooperative at this time.  There have been no acute events since the last update.  Awaiting disposition plan from Social Work team.   Irean Hong, MD 10/21/23 [phone]

## 2023-10-21 NOTE — ED Notes (Signed)
Lunch tray brought to patient; Pt stated she is not ready for lunch yet, so her tray was sat on her bed side table for when she is ready.

## 2023-10-22 ENCOUNTER — Emergency Department: Payer: 59

## 2023-10-22 MED ORDER — ACETAMINOPHEN 500 MG PO TABS
1000.0000 mg | ORAL_TABLET | Freq: Once | ORAL | Status: AC
  Administered 2023-10-22: 1000 mg via ORAL

## 2023-10-22 NOTE — TOC Progression Note (Signed)
Transition of Care [facility]) - Progression Note    Patient Details  Name: DONI BACHA MRN: [id number] Date of Birth: [birth year]  Transition of Care [facility]) CM/SW Contact  Tory Emerald, [place] Phone Number: 10/22/2023, 10:37 AM  Clinical Narrative:     Pt was discussed during today's Difficult to place meeting.   Expected Discharge Plan: Long Term Nursing Home Barriers to Discharge: Continued Medical Work up  Expected Discharge Plan and Services In-house Referral: Clinical Social Work Discharge Planning Services: NA Post Acute Care Choice: Skilled Nursing Facility Living arrangements for the past 2 months: Single Family Home (Unsafe to return)                   DME Agency: NA     Representative spoke with at DME Agency: N/A HH Arranged: NA           Social Determinants of Health (SDOH) Interventions SDOH Screenings   Food Insecurity: No Food Insecurity (08/21/2023)  Housing: Medium Risk (08/21/2023)  Transportation Needs: No Transportation Needs (08/21/2023)  Utilities: Not At Risk (08/21/2023)  Financial Resource Strain: Low Risk  (02/04/2023)   Received from [facility], [facility]  Physical Activity: Inactive (07/16/2021)   Received from [facility]  Stress: No Stress Concern Present (07/16/2021)   Received from [facility], [facility]  Tobacco Use: Medium Risk (09/04/2023)  Health Literacy: Medium Risk (07/16/2021)   Received from [facility], [facility]    Readmission Risk Interventions     No data to display

## 2023-10-22 NOTE — ED Provider Notes (Signed)
-----------------------------------------   10:05 AM on 10/22/2023 -----------------------------------------   Blood pressure (!) 134/59, pulse 70, temperature 98 F (36.7 C), temperature source Oral, resp. rate 17, height 5\' 6"  (1.676 m), weight 90.7 kg, SpO2 99%.  The patient is calm and cooperative at this time.  There have been no acute events since the last update.  Awaiting disposition plan from case management/social work.  Complains of abdominal pain and diarrhea.  Diarrhea for the past 3 days.  No episodes of vomiting or fever.  Mild left lower quadrant abdominal tenderness to palpation.  Given Tylenol and CT scan of the abdomen and pelvis to further evaluate for intra-abdominal pathology or acute diverticulitis    Corena Herter, MD 10/22/23 1006

## 2023-10-22 NOTE — Progress Notes (Signed)
Mobility Specialist - Progress Note   10/22/23 1654  Mobility  Activity Ambulated independently in hallway  Level of Assistance Modified independent, requires aide device or extra time  Assistive Device Front wheel walker  Distance Ambulated (ft) 350 ft  Activity Response Tolerated well  Mobility visit 1 Mobility  Mobility Specialist Start Time (ACUTE ONLY) 1637  Mobility Specialist Stop Time (ACUTE ONLY) 1645  Mobility Specialist Time Calculation (min) (ACUTE ONLY) 8 min   Zetta Bills Mobility Specialist 10/22/23 4:55 PM

## 2023-10-22 NOTE — ED Notes (Signed)
Pt has a headache   meds given.

## 2023-10-22 NOTE — ED Notes (Signed)
Pt to CT via wheelchair

## 2023-10-22 NOTE — ED Provider Notes (Signed)
-----------------------------------------   6:03 AM on 10/22/2023 -----------------------------------------   Blood pressure (!) 166/71, pulse 83, temperature 98.2 F (36.8 C), temperature source Oral, resp. rate 17, height 5\' 6"  (1.676 m), weight 90.7 kg, SpO2 100%.  The patient is calm and cooperative at this time.  There have been no acute events since the last update.  Awaiting disposition plan from Social Work team.   Irean Hong, MD 10/22/23 [phone]

## 2023-10-22 NOTE — ED Notes (Addendum)
Pt took walker and headed towards exit, staff attempted to redirect.  Pt stated that her brother Amada Jupiter was here to take her home.  Staff was able to verbally de escalate pt.  Tracy Dickerson walked with walker back to bed in hallway. Staff notified security that pt was attempting to elope.  Staff to look at bed assignment to move pt to a more appropriate area

## 2023-10-22 NOTE — ED Notes (Signed)
Hospital meal provided, pt tolerated w/o complaints.  Waste discarded appropriately.

## 2023-10-23 NOTE — ED Notes (Signed)
Pt provided with breakfast tray. Pt sitting up eating

## 2023-10-23 NOTE — ED Notes (Signed)
Patient yelling at this RN for medications, this RN explained they were administered earlier by this RN (reference MAR). Patient yells "you aint give me shit!".

## 2023-10-23 NOTE — ED Notes (Signed)
VOL/TOC Placement
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
## 2023-10-23 NOTE — ED Notes (Signed)
Pt walked down the halls towards C pod. Pt stated she wants her hair cut tonight. This tech tried to explain to her that the lady who cuts hair is not here tonight, but Pt became very upset and threw her walker. Pt was then able to be redirected back to her bed after continuously calling this tech a "bitch" Pt is now sitting on side of her bed, talking to her self.

## 2023-10-23 NOTE — ED Notes (Signed)
Hospital meal provided, pt tolerated w/o complaints.  Waste discarded appropriately.

## 2023-10-23 NOTE — ED Notes (Signed)
Pt walking around the nurses station. Yelling loudly at staff when redirected. Pt encouraged to sit on stretcher. Pt states she is going to get her hair cut. When told nobody is here to cut her hair pt yells loudly "yes they are"

## 2023-10-23 NOTE — ED Notes (Signed)
Patient refused vitals.

## 2023-10-23 NOTE — ED Provider Notes (Signed)
-----------------------------------------   6:23 AM on 10/23/2023 -----------------------------------------   Blood pressure (!) 168/70, pulse 85, temperature 98.2 F (36.8 C), temperature source Oral, resp. rate 18, height 1.676 m (5\' 6" ), weight 90.7 kg, SpO2 99%.  The patient is calm and cooperative at this time.  There have been no acute events since the last update.  Awaiting disposition plan from [facility] team.   Loleta Rose, MD 10/23/23 [phone]

## 2023-10-24 NOTE — Progress Notes (Signed)
   10/24/23 1500  Spiritual Encounters  Type of Visit Follow up  Care provided to: Patient  Conversation partners present during encounter Nurse  Referral source Chaplain assessment  Reason for visit Urgent spiritual support  OnCall Visit Yes  Spiritual Framework  Presenting Themes Meaning/purpose/sources of inspiration  Patient Stress Factors Other (Comment) (Hungry)  Family Stress Factors None identified  Intervention Outcomes  Outcomes Reduced anxiety  Spiritual Care Plan  Spiritual Care Issues Still Outstanding No further spiritual care needs at this time (see row info)    Chaplain visited with the patient while rounding on the [facility] side of the main ED.  Chaplain provided empathy and a compassionate presence.  Chaplain will follow-up as patient/staff/family request.      Rev. Rana M. Earlene Plater, MDiv. Chaplain Resident [facility]

## 2023-10-24 NOTE — ED Notes (Signed)
Patient received lunch tray and beverage, she is calm and cooperative.

## 2023-10-24 NOTE — ED Notes (Signed)
Patient took all of her po medications without difficulty.

## 2023-10-24 NOTE — ED Notes (Signed)
 Hospital meal provided, pt tolerated w/o complaints.  Waste discarded appropriately.

## 2023-10-24 NOTE — Progress Notes (Signed)
Mobility Specialist - Progress Note   Pre-mobility: HR, BP, SpO2 During mobility: HR, BP, SpO2 Post-mobility: HR, BP, SPO2     10/24/23 1600  Mobility  Activity Ambulated independently in hallway;Stood at bedside;Dangled on edge of bed  Level of Assistance Standby assist, set-up cues, supervision of patient - no hands on  Assistive Device Front wheel walker  Distance Ambulated (ft) 450 ft  Range of Motion/Exercises Active  Activity Response Tolerated well  Mobility Referral Yes  Mobility visit 1 Mobility   Pt resting in bed on RA upon entry. Pt agreeable to participate in ambulation. Pt STS and ambulates to hallway around NS SBA with RW. Pt less talkative today. Pt endorses no pain and returned to bed left with needs in reach. In view of staff.   Tracy Dickerson Mobility Specialist 10/24/23, 4:03 PM

## 2023-10-24 NOTE — ED Notes (Signed)
Patient is up to the bathroom, no signs of distress, steady on her feet, staff will continue to monitor for safety.

## 2023-10-24 NOTE — ED Notes (Signed)
Patient given extra food tray per request.

## 2023-10-24 NOTE — ED Notes (Signed)
Patient received breakfast tray, and beverage, staff will continue to monitor for safety.

## 2023-10-24 NOTE — ED Notes (Signed)
Snack of vanilla ice cream was provided by RN to patient. Pt ate 100%

## 2023-10-24 NOTE — ED Notes (Signed)
This NT provided pt with breakfast tray, pt sitting up eating.

## 2023-10-24 NOTE — ED Notes (Signed)
Pt keeps getting out of bed and wandering into the B side nurses station. Pt states she wants to see a doctor now. Pt has not given a specific reason. This tech was able to redirect patient back to her bed at this time. Staff will continue to monitor for safety.

## 2023-10-24 NOTE — ED Notes (Signed)
Pt ambulated to bathroom and back

## 2023-10-25 NOTE — ED Notes (Signed)
vol/toc placement.. 

## 2023-10-25 NOTE — ED Notes (Signed)
 Pt given lunch tray.
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
## 2023-10-25 NOTE — ED Provider Notes (Signed)
-----------------------------------------   7:57 AM on 10/25/2023 -----------------------------------------   Blood pressure (!) 124/58, pulse 68, temperature 97.7 F (36.5 C), temperature source Oral, resp. rate 18, height 1.676 m (5\' 6" ), weight 90.7 kg, SpO2 97%.  The patient is calm and cooperative at this time.  There have been no acute events since the last update.  Awaiting disposition plan from [facility] team.   Loleta Rose, MD 10/25/23 [phone]

## 2023-10-25 NOTE — Progress Notes (Signed)
Mobility Specialist - Progress Note    10/25/23 1500  Mobility  Activity Ambulated independently in hallway;Stood at bedside  Level of Assistance Modified independent, requires aide device or extra time  Assistive Device Front wheel walker  Distance Ambulated (ft) 450 ft  Range of Motion/Exercises Active  Activity Response Tolerated well  Mobility Referral Yes  Mobility visit 1 Mobility   Pt resting in bed on RA upon entry. Pt STS and ambulates to hallway around ED Department ModI with RW. Pt returned to bed and left with needs in reach. Pt endorses no pain dizziness or SOB.    Johnathan Hausen Mobility Specialist 10/25/23, 3:09 PM

## 2023-10-26 NOTE — ED Notes (Signed)
Pt asleep at this time. Will obtain vitals and give snack when pt wakes up.

## 2023-10-26 NOTE — ED Notes (Signed)
Pt threw tv remote across the floor yelling "fuck you bitch".

## 2023-10-26 NOTE — ED Notes (Signed)
Pt vol/TOC placement.

## 2023-10-26 NOTE — ED Provider Notes (Signed)
[facility] Observation Note   ----------------------------------------- 10:47 AM on 10/26/2023 -----------------------------------------  Tracy Dickerson is a 83 y.o. female currently boarding in the Emergency Department.  No acute events since last update.  Recent Vitals   Most recent vital signs: Vitals:   10/24/23 0813 10/25/23 0900  BP: (!) 124/58 129/66  Pulse: 68 69  Resp: 18 16  Temp: 97.7 F (36.5 C) 97.8 F (36.6 C)  SpO2: 97% 96%    ED Results / Procedures / Treatments   Labs (all labs ordered are listed, but only abnormal results are displayed) Labs Reviewed  BASIC METABOLIC PANEL - Abnormal; Notable for the following components:      Result Value   Glucose, Bld 128 (*)    BUN 26 (*)    Creatinine, Ser 1.20 (*)    GFR, Estimated 46 (*)    All other components within normal limits  CBC WITH DIFFERENTIAL/PLATELET - Abnormal; Notable for the following components:   RBC 5.18 (*)    MCH 25.5 (*)    RDW 21.7 (*)    All other components within normal limits  URINALYSIS, ROUTINE W REFLEX MICROSCOPIC - Abnormal; Notable for the following components:   Color, Urine YELLOW (*)    APPearance CLEAR (*)    All other components within normal limits  CBG MONITORING, ED - Abnormal; Notable for the following components:   Glucose-Capillary 113 (*)    All other components within normal limits  CBG MONITORING, ED - Abnormal; Notable for the following components:   Glucose-Capillary 147 (*)    All other components within normal limits  CBG MONITORING, ED - Abnormal; Notable for the following components:   Glucose-Capillary 144 (*)    All other components within normal limits  CBG MONITORING, ED - Abnormal; Notable for the following components:   Glucose-Capillary 113 (*)    All other components within normal limits  CBG MONITORING, ED - Abnormal; Notable for the following components:   Glucose-Capillary 148 (*)    All other components within  normal limits  CBG MONITORING, ED - Abnormal; Notable for the following components:   Glucose-Capillary 69 (*)    All other components within normal limits  CBG MONITORING, ED - Abnormal; Notable for the following components:   Glucose-Capillary 131 (*)    All other components within normal limits  CBG MONITORING, ED  CBG MONITORING, ED  CBG MONITORING, ED  CBG MONITORING, ED  CBG MONITORING, ED  CBG MONITORING, ED  CBG MONITORING, ED  CBG MONITORING, ED  CBG MONITORING, ED  CBG MONITORING, ED  CBG MONITORING, ED  CBG MONITORING, ED  CBG MONITORING, ED  CBG MONITORING, ED  CBG MONITORING, ED  CBG MONITORING, ED  CBG MONITORING, ED  CBG MONITORING, ED  CBG MONITORING, ED  CBG MONITORING, ED  CBG MONITORING, ED  CBG MONITORING, ED  CBG MONITORING, ED    MEDICATIONS ORDERED IN ED: Medications  acetaminophen (TYLENOL) tablet 1,000 mg (1,000 mg Oral Given 10/23/23 2336)  albuterol (VENTOLIN HFA) 108 (90 Base) MCG/ACT inhaler 2 puff (has no administration in time range)  acetaminophen (TYLENOL) tablet 500 mg (500 mg Oral Given 10/24/23 2244)  atorvastatin (LIPITOR) tablet 20 mg (20 mg Oral Given 10/25/23 0945)  apixaban (ELIQUIS) tablet 5 mg (5 mg Oral Given 10/25/23 2118)  bisacodyl (DULCOLAX) suppository 10 mg (10 mg Rectal Given 10/24/23 2349)  gabapentin (NEURONTIN) capsule 600 mg (600 mg Oral Given 10/25/23 2118)  hydrocortisone (CORTEF) tablet 5 mg (  5 mg Oral Given 10/25/23 0946)  hydrOXYzine (ATARAX) tablet 10 mg (has no administration in time range)  pantoprazole (PROTONIX) EC tablet 40 mg (40 mg Oral Given 10/25/23 2118)  ondansetron (ZOFRAN-ODT) disintegrating tablet 4 mg (4 mg Oral Given 10/24/23 2244)  melatonin tablet 5 mg (5 mg Oral Given 10/25/23 2118)  haloperidol lactate (HALDOL) injection 2.5 mg (2.5 mg Intramuscular Given 10/18/23 1552)  haloperidol lactate (HALDOL) injection 5 mg (5 mg Intramuscular Given 09/05/23 0715)  lidocaine (XYLOCAINE) 2 % viscous mouth solution 15  mL (15 mLs Mouth/Throat Given 09/14/23 1848)  aspirin tablet 325 mg (325 mg Oral Given 10/02/23 2216)  haloperidol lactate (HALDOL) injection 2.5 mg (2.5 mg Intramuscular Given 10/10/23 1037)  LORazepam (ATIVAN) injection 1 mg (1 mg Intramuscular Given 10/10/23 1037)  haloperidol lactate (HALDOL) injection 2.5 mg (2.5 mg Intramuscular Given 10/14/23 1054)  LORazepam (ATIVAN) injection 1 mg (1 mg Intramuscular Given 10/14/23 1055)  LORazepam (ATIVAN) tablet 1 mg (1 mg Oral Given 10/15/23 1450)  LORazepam (ATIVAN) injection 1 mg (1 mg Intramuscular Given 10/18/23 1552)  acetaminophen (TYLENOL) tablet 1,000 mg (1,000 mg Oral Given 10/22/23 1014)     ED Plan   Currently awaiting placement into an appropriate living facility.  Social work is working with the patient to help achieve this.      Minna Antis, MD 10/26/23 1047

## 2023-10-26 NOTE — ED Notes (Signed)
Pts door closed, pt beating on the glass door, yelling "let me out, open the damn door", repeatedly.

## 2023-10-26 NOTE — ED Notes (Signed)
Pt yelling in the hallway, then threw a chair into the hallway.

## 2023-10-26 NOTE — ED Notes (Signed)
Pt voluntarily allowed RN to give IM injection, EDT held patients hand for support.

## 2023-10-26 NOTE — ED Notes (Signed)
Pt laid self in floor, advised pt to get up, would not comply.

## 2023-10-27 NOTE — ED Notes (Signed)
Transferred to [facility] Via wheelchair by EDT and security. Pt alert and oriented, cooperative, RR even and unlabored, color WNL. Pt in NAD.

## 2023-10-27 NOTE — ED Provider Notes (Signed)
-----------------------------------------   7:58 AM on 10/27/2023 -----------------------------------------   Blood pressure 128/70, pulse 68, temperature 97.6 F (36.4 C), temperature source Oral, resp. rate 16, height 5\' 6"  (1.676 m), weight 90.7 kg, SpO2 98%.  The patient is calm and cooperative at this time.  There have been no acute events since the last update.  Awaiting disposition plan from case management/social work.    Corena Herter, MD 10/27/23 [phone]

## 2023-10-27 NOTE — ED Notes (Signed)
Pt sitting in dayroom at the table speaking with another patient. Appears calm and pleasant.

## 2023-10-27 NOTE — ED Notes (Signed)
Pt denied food tray at this time stated " I don't want to eat until I leave here". RN notified

## 2023-10-27 NOTE — ED Notes (Signed)
 Hospital meal provided.  100% consumed, pt tolerated w/o complaints.  Waste discarded appropriately.

## 2023-10-27 NOTE — ED Notes (Signed)
Patient asked for her breakfast tray, this RN informed patient it was 4am and that breakfast trays usually come around 8am. Patient stated "I dont even want it then, kiss my ass". Patient walked herself back into her room

## 2023-10-28 NOTE — ED Provider Notes (Signed)
-----------------------------------------   4:16 AM on 10/28/2023 -----------------------------------------   Blood pressure (!) 108/58, pulse 71, temperature 97.6 F (36.4 C), temperature source Oral, resp. rate 18, height 5' 6 (1.676 m), weight 90.7 kg, SpO2 98%.  The patient is calm and cooperative at this time.  There have been no acute events since the last update.  Awaiting disposition plan from case management/social work.    Remmy Crass, [name], DO 10/28/23 [phone]

## 2023-10-28 NOTE — TOC Progression Note (Signed)
 Transition of Care [name]) - Progression Note    Patient Details  Name: Tracy Dickerson MRN: [id number] Date of Birth: July 14, 1941  [facility]) CM/SW Contact  Silvano Molt, [place] Phone Number: 10/28/2023, 5:09 PM  Clinical Narrative:     CSW emailed Nia, DSS to inquire about status of any potential placements. Nia reports she has no LTC needs at this time.   Expected Discharge Plan: Long Term Nursing Home Barriers to Discharge: Continued Medical Work up  Expected Discharge Plan and Services In-house Referral: Clinical Social Work Discharge Planning Services: NA Post Acute Care Choice: Skilled Nursing Facility Living arrangements for the past 2 months: Single Family Home (Unsafe to return)                   DME Agency: NA     Representative spoke with at DME Agency: N/A HH Arranged: NA           Social Determinants of Health (SDOH) Interventions SDOH Screenings   Food Insecurity: No Food Insecurity (08/21/2023)  Housing: Medium Risk (08/21/2023)  Transportation Needs: No Transportation Needs (08/21/2023)  Utilities: Not At Risk (08/21/2023)  Financial Resource Strain: Low Risk  (02/04/2023)   Received from [facility], [facility]  Physical Activity: Inactive (07/16/2021)   Received from [facility], [facility]  Stress: No Stress Concern Present (07/16/2021)   Received from [facility], [facility]  Tobacco Use: Medium Risk (09/04/2023)  Health Literacy: Medium Risk (07/16/2021)   Received from [facility], [facility]    Readmission Risk Interventions     No data to display

## 2023-10-28 NOTE — ED Notes (Signed)
Patient was in room yelling out for help. Patient needed assistance getting out of bed and walking to restroom.  Patient was off balance while walking to restroom and getting on and off toilet. This Clinical research associate and ed tech Nicki Guadalajara assisted patient.

## 2023-10-29 NOTE — ED Notes (Signed)
 Snack placed at bedside. Pt sleeping

## 2023-10-29 NOTE — ED Notes (Signed)
 Vol/toc.Marland Kitchen

## 2023-10-29 NOTE — ED Notes (Signed)
 Pt received drink and tray.

## 2023-10-29 NOTE — ED Notes (Signed)
 Hospital meal provided.  100% consumed, pt tolerated w/o complaints.  Waste discarded appropriately.

## 2023-10-29 NOTE — ED Notes (Signed)
 This tech offered shower to pt but pt denied

## 2023-10-29 NOTE — ED Provider Notes (Signed)
-----------------------------------------   6:19 AM on 10/29/2023 -----------------------------------------   Blood pressure (!) 156/82, pulse 87, temperature 98.1 F (36.7 C), temperature source Oral, resp. rate 18, height 5' 6 (1.676 m), weight 90.7 kg, SpO2 94%.  The patient is calm and cooperative at this time.  There have been no acute events since the last update.  Awaiting disposition plan from Social Work team.   Laelyn Blumenthal J, MD 10/29/23 [phone]

## 2023-10-29 NOTE — ED Notes (Addendum)
 Patient wandering around unit. Screaming in face, calling this RN a bitch multiple times and EDT, and attempting to get out back door exit while putting hands on this RN arm. Patient states she is mad because she cannot get a haircut. Security escorting patient back to bed.

## 2023-10-29 NOTE — ED Notes (Signed)
 Breakfast tray given.

## 2023-10-29 NOTE — ED Notes (Signed)
Heather giving patient haircut at this time.

## 2023-10-29 NOTE — ED Notes (Addendum)
 Patient wandering around ED and screaming at this RN and [place], EDT due to informing her she needs to let us  know when she wants to take a walk and cannot just wander along. Patient continues to yell and cuss. Ask patient if she would like to watch television and states yes. Moved patient out of hall to a room and put on bed alarm.

## 2023-10-29 NOTE — ED Notes (Signed)
 Patient given dinner tray at this time.

## 2023-10-30 NOTE — ED Notes (Signed)
 Hospital meal provided.  100% consumed, pt tolerated w/o complaints.  Waste discarded appropriately.

## 2023-10-30 NOTE — Progress Notes (Signed)
 Mobility Specialist - Progress Note   Pre-mobility: HR, BP, SpO2 During mobility: HR, BP, SpO2 Post-mobility: HR, BP, SPO2     10/30/23 1159  Mobility  Activity Ambulated with assistance in hallway;Stood at bedside  Level of Assistance Modified independent, requires aide device or extra time  Assistive Device Front wheel walker  Distance Ambulated (ft) 400 ft  Range of Motion/Exercises Active  Activity Response Tolerated well  Mobility Referral Yes  Mobility visit 1 Mobility   Pt resting in bed on RA upon entry. Pt STS and ambulates to hallway around NS ModI with RW. Pt returned to bed and left with needs in reach.   Guido Rumble Mobility Specialist 10/30/23, 12:01 PM

## 2023-10-30 NOTE — ED Provider Notes (Signed)
-----------------------------------------   5:39 AM on 10/30/2023 -----------------------------------------   Blood pressure (!) 155/78, pulse 80, temperature 98 F (36.7 C), temperature source Oral, resp. rate 18, height 5' 6 (1.676 m), weight 90.7 kg, SpO2 96%.  The patient is calm and cooperative at this time.  There have been no acute events since the last update.  Awaiting disposition plan from Social Work team.   Terris Germano J, MD 10/30/23 [phone]

## 2023-10-30 NOTE — ED Notes (Signed)
 vol/toc.Marland KitchenMarland KitchenMarland Kitchen

## 2023-10-30 NOTE — TOC Progression Note (Signed)
 Transition of Care [facility]) - Progression Note    Patient Details  Name: Tracy Dickerson MRN: [id number] Date of Birth: 1940-11-20  Transition of Care [facility]) CM/SW Contact  Silvano Molt, [place] Phone Number: 10/30/2023, 10:06 AM  Clinical Narrative:     Pt was staffed during the Difficult to place meeting.   CSW conversed with Damian Blanc at [facility], who recommended CSW contact Sari at [facility], [place]. CSW reached out to [place] to discuss. CSW sent pt's information through the HUB.   CSW emailed Nia Brown at DSS to make her aware of recent attempts for disposition. TOC will continue to follow.   Expected Discharge Plan: Long Term Nursing Home Barriers to Discharge: Continued Medical Work up  Expected Discharge Plan and Services In-house Referral: Clinical Social Work Discharge Planning Services: NA Post Acute Care Choice: Skilled Nursing Facility Living arrangements for the past 2 months: Single Family Home (Unsafe to return)                   DME Agency: NA     Representative spoke with at DME Agency: N/A HH Arranged: NA           Social Determinants of Health (SDOH) Interventions SDOH Screenings   Food Insecurity: No Food Insecurity (08/21/2023)  Housing: Medium Risk (08/21/2023)  Transportation Needs: No Transportation Needs (08/21/2023)  Utilities: Not At Risk (08/21/2023)  Financial Resource Strain: Low Risk  (02/04/2023)   [facility], [facility]  Physical Activity: Inactive (07/16/2021)   Received from [facility], [facility]  Stress: No Stress Concern Present (07/16/2021)   Received from [facility], [facility]  Tobacco Use: Medium Risk (09/04/2023)  Health Literacy: Medium Risk (07/16/2021)   Received from [facility], [facility]    Readmission Risk Interventions     No data to display

## 2023-10-31 MED ORDER — POLYETHYLENE GLYCOL 3350 17 G PO PACK
17.0000 g | PACK | Freq: Two times a day (BID) | ORAL | Status: AC
  Administered 2023-10-31 – 2023-11-01 (×3): 17 g via ORAL
  Filled 2023-10-31 (×3): qty 1

## 2023-10-31 NOTE — ED Notes (Signed)
 Hospital meal provided.  100% consumed, pt tolerated w/o complaints.  Waste discarded appropriately.

## 2023-10-31 NOTE — ED Provider Notes (Signed)
 Vitals:   10/31/23 0941 10/31/23 2000  BP: 130/69 (!) 140/69  Pulse: 70 73  Resp: 20 18  Temp: 97.8 F (36.6 C) 98.1 F (36.7 C)  SpO2: 99% 96%     Patient resting comfortably, she is requesting that she is feeling a bit constipated.  Reports he very mild abdominal discomfort abdomen soft nontender nondistended.  Reports passing gas but has not had a bowel movement for a couple of days.  Still eating and drinking vital signs normal no fevers or chills no severe pain.  Discussed with patient will trial MiraLAX  for symptomatic relief.  Notified oncoming physician Dr. Cyrena So patient progression can be followed in reevaluation regarding symptomatology etc with daily rounding.    Dicky Anes, MD 10/31/23 [phone]

## 2023-10-31 NOTE — TOC Progression Note (Signed)
 Transition of Care [facility]) - Progression Note    Patient Details  Name: Tracy Dickerson MRN: [id number] Date of Birth: 10-19-1940  Transition of Care [facility]) CM/SW Contact  Lauraine JAYSON Carpen, LCSW Phone Number: 10/31/2023, 1:37 PM  Clinical Narrative:  Kathlean Milian declined.   Expected Discharge Plan: Long Term Nursing Home Barriers to Discharge: Continued Medical Work up  Expected Discharge Plan and Services In-house Referral: Clinical Social Work Discharge Planning Services: NA Post Acute Care Choice: Skilled Nursing Facility Living arrangements for the past 2 months: Single Family Home (Unsafe to return)                   DME Agency: NA     Representative spoke with at DME Agency: N/A HH Arranged: NA           Social Determinants of Health (SDOH) Interventions SDOH Screenings   Food Insecurity: No Food Insecurity (08/21/2023)  Housing: Medium Risk (08/21/2023)  Transportation Needs: No Transportation Needs (08/21/2023)  Utilities: Not At Risk (08/21/2023)  Financial Resource Strain: Low Risk  (02/04/2023)   Received from [facility], [facility]  Physical Activity: Inactive (07/16/2021)   Received from [facility], [facility]  Stress: No Stress Concern Present (07/16/2021)   Received from [facility], [facility]  Tobacco Use: Medium Risk (09/04/2023)  Health Literacy: Medium Risk (07/16/2021)   Received from [facility], [facility]    Readmission Risk Interventions     No data to display

## 2023-10-31 NOTE — Progress Notes (Signed)
 Mobility Specialist - Progress Note   Pre-mobility: HR, BP, SpO2 During mobility: HR, BP, SpO2 Post-mobility: HR, BP, SPO2     10/31/23 1707  Mobility  Activity Ambulated independently in hallway  Level of Assistance Independent  Assistive Device Front wheel walker  Distance Ambulated (ft) 450 ft  Range of Motion/Exercises Active  Activity Response Tolerated well  Mobility Referral Yes  Mobility visit 1 Mobility    Tracy Dickerson Mobility Specialist 10/31/23, 5:08 PM

## 2023-10-31 NOTE — ED Notes (Signed)
 Snacks were given.

## 2023-10-31 NOTE — ED Notes (Signed)
Meal placed at side.

## 2023-10-31 NOTE — ED Notes (Signed)
 VOL/TOC Placement

## 2023-11-01 ENCOUNTER — Emergency Department: Payer: 59

## 2023-11-01 NOTE — ED Notes (Signed)
 Pt sitting in chair in hallway, calm and cooperative.  Socializing with peers and staff

## 2023-11-01 NOTE — ED Notes (Signed)
 Pt currently sitting in bed eating dinner tray

## 2023-11-01 NOTE — ED Notes (Signed)
 Pt finished with dinner, tray disposed of.

## 2023-11-01 NOTE — ED Notes (Signed)
 Tracy Dickerson continues to present at baseline.  Calm, and compliant with medications. Cont to monitor as ordered.

## 2023-11-01 NOTE — ED Notes (Signed)
 Dinner tray provided for pt

## 2023-11-01 NOTE — ED Provider Notes (Signed)
 Emergency Medicine Observation Re-evaluation Note  Physical Exam   BP (!) 140/69 (BP Location: Left Arm)   Pulse 73   Temp 98.1 F (36.7 C) (Oral)   Resp 18   Ht 5' 6 (1.676 m)   Wt 90.7 kg   SpO2 96%   BMI 32.28 kg/m   Patient appears in no acute distress.  ED Course / MDM   Patient resting comfortably with constipation, abdominal pain ongoing for several days.  MiraLAX  ordered by previous provider.  I rechecked the patient at this time, persistent symptoms.  Nondistended abdomen, no rigidity or guarding though mild diffuse tenderness.  Getting a dry CT scan given her kidney function rule out bowel obstruction.  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  Pending final report by radiologist.  Pt is awaiting dispo from consultants   Ginnie Shams MD    Shams Ginnie, MD 11/01/23 0500

## 2023-11-01 NOTE — ED Notes (Signed)
 Hospital meal provided, pt tolerated w/o complaints.  Waste discarded appropriately.

## 2023-11-02 NOTE — ED Notes (Signed)
 VOL/TOC Placement

## 2023-11-02 NOTE — ED Notes (Signed)
 Cup of ice given by this RN per request

## 2023-11-02 NOTE — ED Notes (Signed)
 Hospital meal provided, pt tolerated w/o complaints.  Waste discarded appropriately.

## 2023-11-02 NOTE — ED Notes (Signed)
 Pt provided breakfast tray; Pt still sleeping, tray left at bedside.

## 2023-11-02 NOTE — ED Notes (Signed)
 Pt provided with lunch tray. Pt sitting up eating.

## 2023-11-02 NOTE — ED Provider Notes (Signed)
 [name]   ----------------------------------------- 9:30 AM on 11/02/2023 -----------------------------------------  Tracy Dickerson is a 83 y.o. female currently boarding in the Emergency Department.  No acute events since last update.  Recent Vitals   Most recent vital signs: Vitals:   11/01/23 0940 11/01/23 1941  BP: 124/67 129/68  Pulse: 63 74  Resp: 15 19  Temp: 98.2 F (36.8 C) 98.1 F (36.7 C)  SpO2: 95% 94%    ED Results / Procedures / Treatments   Labs (all labs ordered are listed, but only abnormal results are displayed) Labs Reviewed  BASIC METABOLIC PANEL - Abnormal; Notable for the following components:      Result Value   Glucose, Bld 128 (*)    BUN 26 (*)    Creatinine, Ser 1.20 (*)    GFR, Estimated 46 (*)    All other components within normal limits  CBC WITH DIFFERENTIAL/PLATELET - Abnormal; Notable for the following components:   RBC 5.18 (*)    MCH 25.5 (*)    RDW 21.7 (*)    All other components within normal limits  URINALYSIS, ROUTINE W REFLEX MICROSCOPIC - Abnormal; Notable for the following components:   Color, Urine YELLOW (*)    APPearance CLEAR (*)    All other components within normal limits  CBG MONITORING, ED - Abnormal; Notable for the following components:   Glucose-Capillary 113 (*)    All other components within normal limits  CBG MONITORING, ED - Abnormal; Notable for the following components:   Glucose-Capillary 147 (*)    All other components within normal limits  CBG MONITORING, ED - Abnormal; Notable for the following components:   Glucose-Capillary 144 (*)    All other components within normal limits  CBG MONITORING, ED - Abnormal; Notable for the following components:   Glucose-Capillary 113 (*)    All other components within normal limits  CBG MONITORING, ED - Abnormal; Notable for the following components:   Glucose-Capillary 148 (*)    All other components within  normal limits  CBG MONITORING, ED - Abnormal; Notable for the following components:   Glucose-Capillary 69 (*)    All other components within normal limits  CBG MONITORING, ED - Abnormal; Notable for the following components:   Glucose-Capillary 131 (*)    All other components within normal limits  CBG MONITORING, ED  CBG MONITORING, ED  CBG MONITORING, ED  CBG MONITORING, ED  CBG MONITORING, ED  CBG MONITORING, ED  CBG MONITORING, ED  CBG MONITORING, ED  CBG MONITORING, ED  CBG MONITORING, ED  CBG MONITORING, ED  CBG MONITORING, ED  CBG MONITORING, ED  CBG MONITORING, ED  CBG MONITORING, ED  CBG MONITORING, ED  CBG MONITORING, ED  CBG MONITORING, ED  CBG MONITORING, ED  CBG MONITORING, ED  CBG MONITORING, ED  CBG MONITORING, ED  CBG MONITORING, ED    MEDICATIONS ORDERED IN ED: Medications  acetaminophen  (TYLENOL ) tablet 1,000 mg (1,000 mg Oral Given 10/23/23 2336)  albuterol  (VENTOLIN  HFA) 108 (90 Base) MCG/ACT inhaler 2 puff (has no administration in time range)  acetaminophen  (TYLENOL ) tablet 500 mg (500 mg Oral Given 10/24/23 2244)  atorvastatin  (LIPITOR) tablet 20 mg (20 mg Oral Given 11/01/23 1006)  apixaban  (ELIQUIS ) tablet 5 mg (5 mg Oral Given 11/01/23 2144)  bisacodyl  (DULCOLAX) suppository 10 mg (10 mg Rectal Given 10/24/23 2349)  gabapentin  (NEURONTIN ) capsule 600 mg (600 mg Oral Given 11/01/23 2144)  hydrocortisone  (CORTEF ) tablet 5 mg (5  mg Oral Given 11/01/23 1005)  hydrOXYzine  (ATARAX ) tablet 10 mg (has no administration in time range)  pantoprazole  (PROTONIX ) EC tablet 40 mg (40 mg Oral Given 11/01/23 2144)  ondansetron  (ZOFRAN -ODT) disintegrating tablet 4 mg (4 mg Oral Given 10/31/23 2143)  melatonin tablet 5 mg (5 mg Oral Given 11/01/23 2144)  haloperidol  lactate (HALDOL ) injection 2.5 mg (2.5 mg Intramuscular Given 10/26/23 1802)  haloperidol  lactate (HALDOL ) injection 5 mg (5 mg Intramuscular Given 09/05/23 0715)  lidocaine  (XYLOCAINE ) 2 % viscous mouth solution 15  mL (15 mLs Mouth/Throat Given 09/14/23 1848)  aspirin  tablet 325 mg (325 mg Oral Given 10/02/23 2216)  haloperidol  lactate (HALDOL ) injection 2.5 mg (2.5 mg Intramuscular Given 10/10/23 1037)  LORazepam  (ATIVAN ) injection 1 mg (1 mg Intramuscular Given 10/10/23 1037)  haloperidol  lactate (HALDOL ) injection 2.5 mg (2.5 mg Intramuscular Given 10/14/23 1054)  LORazepam  (ATIVAN ) injection 1 mg (1 mg Intramuscular Given 10/14/23 1055)  LORazepam  (ATIVAN ) tablet 1 mg (1 mg Oral Given 10/15/23 1450)  LORazepam  (ATIVAN ) injection 1 mg (1 mg Intramuscular Given 10/18/23 1552)  acetaminophen  (TYLENOL ) tablet 1,000 mg (1,000 mg Oral Given 10/22/23 1014)  polyethylene glycol (MIRALAX  / GLYCOLAX ) packet 17 g (17 g Oral Given 11/01/23 2148)     ED Plan   Currently awaiting placement into an appropriate living facility.  Social work is working with the patient to help achieve this.      Tracy Drivers, MD 11/02/23 [phone]

## 2023-11-02 NOTE — ED Notes (Signed)
 VOL  TOC  PLACEMENT

## 2023-11-02 NOTE — ED Notes (Signed)
 Pt ambulatory to restroom at this time.

## 2023-11-02 NOTE — Progress Notes (Signed)
 Mobility Specialist - Progress Note     11/02/23 1609  Mobility  Activity Ambulated independently in hallway;Stood at bedside  Level of Assistance Modified independent, requires aide device or extra time  Assistive Device Front wheel walker  Distance Ambulated (ft) 500 ft  Range of Motion/Exercises Active  Activity Response Tolerated well  Mobility Referral Yes  Mobility visit 1 Mobility  Mobility Specialist Start Time (ACUTE ONLY) 1546  Mobility Specialist Stop Time (ACUTE ONLY) 1603  Mobility Specialist Time Calculation (min) (ACUTE ONLY) 17 min   Pt resting in bed on RA upon entry. Pt STS and ambulates to hallway around NS ModI with RW. Pt returned to bed and left with needs in reach in view of ED staff.   Guido Rumble Mobility Specialist 11/02/23, 4:16 PM

## 2023-11-03 MED ORDER — HALOPERIDOL 5 MG PO TABS
5.0000 mg | ORAL_TABLET | Freq: Four times a day (QID) | ORAL | Status: DC | PRN
  Administered 2023-11-03: 5 mg via ORAL
  Filled 2023-11-03: qty 1

## 2023-11-03 NOTE — ED Provider Notes (Signed)
-----------------------------------------   5:43 AM on 11/03/2023 -----------------------------------------   Blood pressure (!) 159/80, pulse 78, temperature 97.7 F (36.5 C), resp. rate 16, height 5\' 6"  (1.676 m), weight 90.7 kg, SpO2 98%.  The patient is calm and cooperative at this time.  There have been no acute events since the last update.  Awaiting disposition plan from Social Work team.   Cheron Pasquarelli J, MD 11/03/23 [phone]

## 2023-11-03 NOTE — ED Notes (Signed)
 Pt given snack and beverage.

## 2023-11-03 NOTE — ED Notes (Signed)
 Pt up and states going to breast cancer fundraiser. Pt reoriented. Orange juice provided.

## 2023-11-03 NOTE — ED Notes (Signed)
 Pt standing yelling at staff. Pt not redirectable at this time. Multiple failed attempts to deescalate. Pt back to bed. Pt compliant with PO medication to treat agitation at this time.

## 2023-11-03 NOTE — ED Notes (Signed)
 Pt provided breakfast tray.

## 2023-11-04 NOTE — ED Notes (Signed)
Hospital meal provided, pt tolerated w/o complaints.  Waste discarded appropriately.

## 2023-11-04 NOTE — TOC Progression Note (Signed)
Transition of Care [facility]) - Progression Note    Patient Details  Name: Tracy Dickerson MRN: [id number] Date of Birth: [birth year]  [facility]) CM/SW Contact  Tory Emerald, [place] Phone Number: 11/04/2023, 10:13 AM  Clinical Narrative:     CSW consulted with Rica Mote at [facility] to see if anything has changed with bed availability; [facility] Dec reports no new bed openings. CSW emailed [name], DSS to make her aware.   Expected Discharge Plan: Long Term Nursing Home Barriers to Discharge: Continued Medical Work up  Expected Discharge Plan and Services In-house Referral: Clinical Social Work Discharge Planning Services: NA Post Acute Care Choice: Skilled Nursing Facility Living arrangements for the past 2 months: Single Family Home (Unsafe to return)                   DME Agency: NA     Representative spoke with at DME Agency: N/A HH Arranged: NA           Social Determinants of Health (SDOH) Interventions SDOH Screenings   Food Insecurity: No Food Insecurity (08/21/2023)  Housing: Medium Risk (08/21/2023)  Transportation Needs: No Transportation Needs (08/21/2023)  Utilities: Not At Risk (08/21/2023)  Financial Resource Strain: Low Risk  (02/04/2023)   Received from [facility], [facility]  Physical Activity: Inactive (07/16/2021)   Received from [facility], [facility]  Stress: No Stress Concern Present (07/16/2021)   Received from [facility], [facility]  Tobacco Use: Medium Risk (09/04/2023)  Health Literacy: Medium Risk (07/16/2021)   Received from [facility], [facility]    Readmission Risk Interventions     No data to display

## 2023-11-04 NOTE — ED Provider Notes (Signed)
-----------------------------------------   5:44 AM on 11/04/2023 -----------------------------------------   Blood pressure (!) 130/54, pulse 87, temperature 97.6 F (36.4 C), temperature source Oral, resp. rate 18, height 5\' 6"  (1.676 m), weight 90.7 kg, SpO2 96%.  The patient is calm and cooperative at this time.  There have been no acute events since the last update.  Awaiting disposition plan from Social Work team.   Irean Hong, MD 11/04/23 [phone]

## 2023-11-04 NOTE — ED Notes (Signed)
Pt up walking to restroom with walker. Returned to bed without issue.

## 2023-11-04 NOTE — ED Notes (Signed)
VOL/ TOC

## 2023-11-04 NOTE — ED Notes (Signed)
Pt provided with lunch tray. Pt sitting up eating.

## 2023-11-04 NOTE — ED Notes (Signed)
Vol/toc.Marland Kitchen

## 2023-11-04 NOTE — Progress Notes (Signed)
Mobility Specialist - Progress Note     11/04/23 1108  Mobility  Activity Ambulated independently in hallway;Stood at bedside;Dangled on edge of bed  Level of Assistance Modified independent, requires aide device or extra time  Assistive Device Front wheel walker  Distance Ambulated (ft) 680 ft  Range of Motion/Exercises Active  Activity Response Tolerated well  Mobility Referral Yes  Mobility visit 1 Mobility  Mobility Specialist Start Time (ACUTE ONLY) 1043  Mobility Specialist Stop Time (ACUTE ONLY) 1110  Mobility Specialist Time Calculation (min) (ACUTE ONLY) 27 min   Pt resting in bed on RA upon entry. Pt STS and ambulates to hallway around NS ModI with RW. Pt returned to bed and left with needs in reach. Pt in view of ED staffing. No changes during ambulation session.   Johnathan Hausen Mobility Specialist 11/04/23, 11:12 AM

## 2023-11-05 LAB — URINALYSIS, ROUTINE W REFLEX MICROSCOPIC
Bilirubin Urine: NEGATIVE
Glucose, UA: NEGATIVE mg/dL
Hgb urine dipstick: NEGATIVE
Ketones, ur: NEGATIVE mg/dL
Leukocytes,Ua: NEGATIVE
Nitrite: NEGATIVE
Protein, ur: NEGATIVE mg/dL
Specific Gravity, Urine: 1.014 (ref 1.005–1.030)
pH: 6 (ref 5.0–8.0)

## 2023-11-05 NOTE — ED Notes (Signed)
EDT sitting outside patient room along with security. Patient is attempting to get up and walk out of room while being a fall risk. Patient is informed she is not able to leave room at this time due to behavior and being unwilling to stop yelling and throwing a cup of water across the room.

## 2023-11-05 NOTE — ED Notes (Signed)
Patient given dinner tray at this time.

## 2023-11-05 NOTE — ED Notes (Signed)
This tech obtained vital signs on pt.

## 2023-11-05 NOTE — ED Notes (Signed)
Breakfast tray provided for pt.

## 2023-11-05 NOTE — ED Notes (Signed)
Patient is told she either must stay in wheelchair or get into bed due to receiving medication and is now more of a fall risk than already was. Patient stays in wheelchair and continues to scream and yell due to not getting her way of wanting to sit in the hallway. Patient uses walker to try and hit the bed and destruct hospital property thus walker was taken away from patient at this time.

## 2023-11-05 NOTE — ED Provider Notes (Signed)
-----------------------------------------   5:31 AM on 11/05/2023 -----------------------------------------   Blood pressure 132/64, pulse 69, temperature 97.7 F (36.5 C), temperature source Oral, resp. rate 14, height 5\' 6"  (1.676 m), weight 90.7 kg, SpO2 95%.  The patient is calm and cooperative at this time.  There have been no acute events since the last update.  Awaiting disposition plan from Social Work team.   Irean Hong, MD 11/05/23 [phone]

## 2023-11-05 NOTE — ED Notes (Addendum)
Patient taken to the bathroom and again unable to provide urine sample. When asking patient for a second time if she would be willing to take night time medications still states she will not. Allowed patient to sit in hall for five minutes while linen was changed and bed wiped down by purple wipes due to sheets being wet from patient throwing cup of water.

## 2023-11-05 NOTE — ED Notes (Signed)
Lunch tray provided to pt. Waste discarded appropriately.

## 2023-11-05 NOTE — ED Notes (Addendum)
Patient in hallway screaming and cussing at this RN and multiple other staff members including security due to not being able to wonder around the hospital and "everyone telling her what she has to do". Thoughts are disorganized stating things like she wants to see her doctor down the hallway, wants a shot of medicine, she has cancer, etc.  MD and multiple staff members speak with patient and try to deescalate along with MD and charge nurse. Wheelchair is brought by staff and patient is wheeled back into room. Patient continues to stay agitated stating "she wants to kill herself" "we are not the boss of her" as well as continuing to cuss and scream at each individual person.

## 2023-11-05 NOTE — ED Notes (Addendum)
Patient taken back to room in wheelchair and is upset and yelling and screaming due to not being allowed to sit in hallway due to current behavior. Patient then grabs glasses and throws them across the room at security officer.

## 2023-11-05 NOTE — ED Notes (Addendum)
Patient willing to go to the bathroom and attempt to get urine sample and change pants, underwear and socks. Urine sample unable to be obtained. This RN asked patient if got night medication if she would take it and she screamed and stated no she will not take anything.

## 2023-11-05 NOTE — ED Notes (Addendum)
Patient is taken to bathroom in wheelchair by this RN, Erie Noe - Charge, [employer] - EDT. Patient attempts to act like she suddenly slumps over and is unresponsive . 10 seconds later patient stops "faking" and gets up and ambulates to toilet with assistance.

## 2023-11-05 NOTE — TOC Progression Note (Signed)
Transition of Care [facility]) - Progression Note    Patient Details  Name: Tracy Dickerson MRN: [id number] Date of Birth: May 05, 1941  [facility]) CM/SW Contact  Tory Emerald, [place] Phone Number: 11/05/2023, 9:40 AM  Clinical Narrative:     CSW conversed with Ginger at [facility] regarding pt. CSW printed and scanned clinical documentation to Ginger for her review. TOC continue to follow.   Expected Discharge Plan: Long Term Nursing Home Barriers to Discharge: Continued Medical Work up  Expected Discharge Plan and Services In-house Referral: Clinical Social Work Discharge Planning Services: NA Post Acute Care Choice: Skilled Nursing Facility Living arrangements for the past 2 months: Single Family Home (Unsafe to return)                   DME Agency: NA     Representative spoke with at DME Agency: N/A HH Arranged: NA           Social Determinants of Health (SDOH) Interventions SDOH Screenings   Food Insecurity: No Food Insecurity (08/21/2023)  Housing: Medium Risk (08/21/2023)  Transportation Needs: No Transportation Needs (08/21/2023)  Utilities: Not At Risk (08/21/2023)  Financial Resource Strain: Low Risk  (02/04/2023)   Received from [facility], [facility]  Physical Activity: Inactive (07/16/2021)   Received from [facility], [facility]  Stress: No Stress Concern Present (07/16/2021)   Received from [facility], [facility]  Tobacco Use: Medium Risk (09/04/2023)  Health Literacy: Medium Risk (07/16/2021)   Received from [facility], [facility]    Readmission Risk Interventions     No data to display

## 2023-11-05 NOTE — ED Notes (Signed)
Pt up to restroom with walker. Gait steady.

## 2023-11-05 NOTE — ED Notes (Signed)
VOL/ TOC

## 2023-11-05 NOTE — ED Notes (Signed)
VOL/TOC Placement

## 2023-11-05 NOTE — ED Notes (Signed)
When speaking with patient informed her that if she was willing to give me something I needed she would be able to get something she wanted. Allowed patient to sit in hallway for ten minutes if she would be willing to take night medications instead of refusing them. Selena Batten, RN escorted patient back to room and got patient a snack at this time.

## 2023-11-06 ENCOUNTER — Encounter (INDEPENDENT_AMBULATORY_CARE_PROVIDER_SITE_OTHER): Payer: Self-pay

## 2023-11-06 NOTE — ED Notes (Signed)
Patient currently refusing medications. Will attempt again later to see if patient will take.

## 2023-11-06 NOTE — ED Notes (Signed)
Pt refused snack this evening

## 2023-11-06 NOTE — ED Notes (Signed)
Patient in hall yelling and screaming. Patient demanding to sit in hallway. Explained to patient once she stopped yelling and calling people names she would be able to spend time sitting in the hall. Patient then became violent and threw walker at this RN. Patient was then escorted back to room by security.

## 2023-11-06 NOTE — ED Notes (Addendum)
Patient requesting help to get up and go to the bathroom. Assisted patient up from bed to ambulate with walker into bathroom. Patient then started yelling due to this RN needing to be in the bathroom since patient continuously claims "she fell multiple times in this particular bathroom" and is a fall risk I needed to stay to make sure she did not fall. Patient decided to sit on toilet with clothing and started yelling and banging on wall threatening she is "going to go to the bathroom on herself". So I said she could and we would then clean her up. Patient decided to proceed to get up and remove clothing and go to the bathroom on the toilet. Patient when finished ambulates with walker back to room.

## 2023-11-06 NOTE — ED Notes (Signed)
VOL/TOC Placement

## 2023-11-06 NOTE — ED Notes (Signed)
On assessment following care handoff---patient is sleeping in her room following IM Haldol during previous shift. Chest rise and fall visible and auditory sounds of breathing heard on visual assessment.

## 2023-11-06 NOTE — ED Notes (Signed)
Meals tray provided to patient.

## 2023-11-06 NOTE — ED Provider Notes (Signed)
Emergency Medicine Observation Re-evaluation Note  Tracy Dickerson is a 83 y.o. female, seen on rounds today.  Pt initially presented to the ED for complaints of Agitation Currently, the patient is resting.  Physical Exam  BP (!) 136/55 (BP Location: Left Arm)   Pulse 70   Temp 97.7 F (36.5 C) (Oral)   Resp 17   Ht 5\' 6"  (1.676 m)   Wt 90.7 kg   SpO2 98%   BMI 32.28 kg/m  Physical Exam .Gen:  No acute distress Resp:  Breathing easily and comfortably, no accessory muscle usage Neuro:  Moving all four extremities, no gross focal neuro deficits Psych:  Resting currently, calm when awake   ED Course / MDM  EKG:   I have reviewed the labs performed to date as well as medications administered while in observation.  Recent changes in the last 24 hours include received 5mg  haldol for agitation.  Plan  Current plan is for SW dispo.    Claybon Jabs, MD 11/06/23 [phone]

## 2023-11-06 NOTE — ED Notes (Signed)
Dinner tray provided for pt

## 2023-11-06 NOTE — ED Notes (Signed)
Patient laying in bed. Asked patient if I was able to throw away her trash and stated yes and asked this RN to cover her with a blanket.

## 2023-11-06 NOTE — ED Notes (Signed)
Patient refusing vitals to be taken. When entering patients room a few minutes later informed patient needed remote to change another tv and started refusing to give back the remote and then proceeded to throw remote to try to hit RN and into the hallway. Patient is screaming in room at this time "bitch" over and over.

## 2023-11-07 MED ORDER — LORAZEPAM 2 MG/ML IJ SOLN
2.0000 mg | Freq: Once | INTRAMUSCULAR | Status: AC
Start: 2023-11-07 — End: 2023-11-07
  Administered 2023-11-07: 2 mg via INTRAMUSCULAR
  Filled 2023-11-07: qty 1

## 2023-11-07 NOTE — ED Notes (Signed)
Pt continues to yell mother fucker from inside room, attempted to close door multiple times patient continues to open it and scream

## 2023-11-07 NOTE — ED Notes (Signed)
Staff guided patient back to room and placed on bed, she said "fuck you you son of a bitch" and attempted to hit this RN and Engineer, materials with walker, walker will be placed outside door

## 2023-11-07 NOTE — ED Notes (Signed)
Pt scooted off bed and into the floor while talking to this RN, she stated she wanted to lay in the floor and stated "fuck you if you don't like it bitch", will let patient lay in floor for a bit and try to redirect back to bed once she calms down

## 2023-11-07 NOTE — ED Notes (Signed)
Pt. Refuses to stay in room and continues to yell curse words at staff, will medicate with PRN

## 2023-11-07 NOTE — ED Provider Notes (Signed)
Emergency Medicine Observation Re-evaluation Note  Physical Exam   BP (!) 143/51 (BP Location: Right Arm)   Pulse 78   Temp 98 F (36.7 C) (Oral)   Resp 19   Ht 5\' 6"  (1.676 m)   Wt 90.7 kg   SpO2 95%   BMI 32.28 kg/m   Patient appears in no acute distress.  ED Course / MDM   No reported events during my shift at the time of this note.   Pt is awaiting dispo from consultants   Pilar Jarvis MD    Pilar Jarvis, MD 11/07/23 [phone]

## 2023-11-07 NOTE — ED Notes (Signed)
Hospital meal provided, pt tolerated w/o complaints.  Waste discarded appropriately.

## 2023-11-07 NOTE — ED Notes (Signed)
Pt is currently sleeping will give snack once she is more awake.

## 2023-11-07 NOTE — ED Notes (Signed)
Pt. Is asleep , received PRN earlier for aggression, will give night meds when she wakes up

## 2023-11-07 NOTE — ED Notes (Signed)
Breakfast provided.

## 2023-11-07 NOTE — ED Notes (Signed)
Pt. In hallway yelling "mother fucker", refuses to go back into room

## 2023-11-07 NOTE — ED Notes (Signed)
Pt out of room and when redirected by [employer] Officer she threw her walker and starting cursing. She then came back out room and threw her meal tray and picked up her walker, took it back to room.

## 2023-11-08 ENCOUNTER — Emergency Department: Payer: 59

## 2023-11-08 LAB — RESP PANEL BY RT-PCR (RSV, FLU A&B, COVID)  RVPGX2
Influenza A by PCR: NEGATIVE
Influenza B by PCR: NEGATIVE
Resp Syncytial Virus by PCR: NEGATIVE
SARS Coronavirus 2 by RT PCR: NEGATIVE

## 2023-11-08 NOTE — ED Notes (Signed)
 This RN and Paulino Rily assisted pt in getting out of bed. Once out of bed pt able to ambulate to the restroom with a walker. When throwing pts trash away RN noted think yellowish sputum in one of the cups by pts bed. MD made aware.

## 2023-11-08 NOTE — ED Notes (Signed)
 Pt sleeping with equal and unlabored respirations.

## 2023-11-08 NOTE — ED Notes (Signed)
 Gave pt clean pants and underwear. Disposed of dirty clothes.

## 2023-11-08 NOTE — ED Notes (Signed)
 EDT Tracy Dickerson gave pt her meal tray

## 2023-11-08 NOTE — ED Notes (Signed)
Pt was provided with a snack.

## 2023-11-08 NOTE — ED Notes (Signed)
 Pt still sleeping at this time. Pts respirations are equal and unlabored. Pt is in NAD.

## 2023-11-08 NOTE — ED Provider Notes (Signed)
-----------------------------------------   7:25 AM on 11/08/2023 -----------------------------------------   Blood pressure 135/64, pulse 83, temperature 98.2 F (36.8 C), temperature source Oral, resp. rate 17, height 5\' 6"  (1.676 m), weight 90.7 kg, SpO2 93%.  The patient is calm and cooperative at this time.  There have been no acute events since the last update.  Awaiting disposition plan from case management/social work.    Berniece Abid, Layla Maw, DO 11/08/23 [phone]

## 2023-11-08 NOTE — ED Notes (Signed)
 Report from [address], [place]. Pt is voluntary and pending placement. Pt is sleeping at this time and is in NAD.

## 2023-11-08 NOTE — ED Notes (Signed)
 Hospital meal provided, pt tolerated w/o complaints.  Waste discarded appropriately.

## 2023-11-08 NOTE — ED Notes (Signed)
 Assisted patient with ambulating in the hallway of quad. Pt having difficulty getting out of the chair and out of the bed today. Pt requires 2 people to get her up out of the chair and bed. Pt able to ambulate with her walker without difficulty. Pt currently sitting in the recliner.

## 2023-11-08 NOTE — ED Notes (Signed)
 Pt. Is awake and angry, yelling and cursing in room

## 2023-11-08 NOTE — ED Notes (Signed)
 Pt is is her room yelling, during 15 min rounds, pt has placed her pillow in floor.

## 2023-11-08 NOTE — ED Provider Notes (Signed)
 Emergency Medicine Observation Re-evaluation Note  Tracy Dickerson is a 84 y.o. female, seen on rounds today.  Pt initially presented to the ED for complaints of Agitation  Currently, the patient is resting in bed. Reporting cough to RN.   Physical Exam  BP 135/64   Pulse 83   Temp 98.2 F (36.8 C) (Oral)   Resp 17   Ht 5\' 6"  (1.676 m)   Wt 90.7 kg   SpO2 93%   BMI 32.28 kg/m  Physical Exam General: Resting in bed  ED Course / MDM   No labs last 24 hours.  Plan  Current plan is for dispo per social work. With cough, will order viral swab and CXR to further evaluate.     Trinna Post, MD 11/08/23 [phone]

## 2023-11-09 LAB — CREATININE, SERUM
Creatinine, Ser: 1.31 mg/dL — ABNORMAL HIGH (ref 0.44–1.00)
GFR, Estimated: 41 mL/min — ABNORMAL LOW (ref >60)

## 2023-11-09 LAB — CBC
HCT: 38.6 % (ref 36.0–46.0)
Hemoglobin: 12.3 g/dL (ref 12.0–15.0)
MCH: 28.5 pg (ref 26.0–34.0)
MCHC: 31.9 g/dL (ref 30.0–36.0)
MCV: 89.6 fL (ref 80.0–100.0)
Platelets: 158 10*3/uL (ref 150–400)
RBC: 4.31 MIL/uL (ref 3.87–5.11)
RDW: 17.2 % — ABNORMAL HIGH (ref 11.5–15.5)
WBC: 6.4 10*3/uL (ref 4.0–10.5)
nRBC: 0 % (ref 0.0–0.2)

## 2023-11-09 NOTE — ED Provider Notes (Signed)
-----------------------------------------   6:38 AM on 11/09/2023 -----------------------------------------   Blood pressure 138/60, pulse 78, temperature 98.4 F (36.9 C), temperature source Oral, resp. rate 16, height 1.676 m (5\' 6" ), weight 90.7 kg, SpO2 96%.  The patient is calm and cooperative at this time.  There have been no acute events since the last update.  Awaiting disposition plan from [facility] team.   Loleta Rose, MD 11/09/23 [phone]

## 2023-11-09 NOTE — ED Notes (Signed)
 2 unsuccessful attempts at obtaining labs.

## 2023-11-09 NOTE — ED Notes (Signed)
 Pt provided with dinner tray.

## 2023-11-10 NOTE — ED Notes (Signed)
 Patient continues to come out in hall and attempt to sit on 20hall bed. This RN redirected patient back to room, patient continues to yell at this RN and cuss at this RN for not allowing her to sit on the other patient's bed.

## 2023-11-10 NOTE — ED Notes (Signed)
 This tech attempted to take patients vitals, I approached the door and asked could I check her vitals, Pt yelled at this tech "NO". Tried to bargain with pt but pt still refused.  Will attempt to get vitals again later.

## 2023-11-10 NOTE — ED Notes (Signed)
 Patient yelling "you're a bitch" to this RN because she cannot sit on another patient's bed. Patient continues to yell and call this RN a "bitch" after redirected back to room by security.

## 2023-11-10 NOTE — ED Notes (Signed)
 Pt was offered lunch, pt refused multiple times.

## 2023-11-10 NOTE — ED Notes (Incomplete)
 Pt remains agitated. Pt frequently exhibiting attention-seeking behaviors - calling out for help,  seating self on floor, and shaking limbs "uncontrollably".

## 2023-11-10 NOTE — ED Notes (Signed)
 Patient coming into hall and yelling

## 2023-11-10 NOTE — ED Notes (Addendum)
 Patient continues to try to sit on 20hall bed while another patient in bed. This RN and security having to tell patient multiple times this is not allowed. Patient yells at this RN "Go to hell you fucking bitch". Continues to cuss at staff. Refusing all medications and vitals stating "I aint taking nothing from you bitch".

## 2023-11-10 NOTE — TOC Progression Note (Signed)
 Transition of Care [facility]) - Progression Note    Patient Details  Name: Tracy Dickerson MRN: [id number] Date of Birth: [birth year]  Transition of Care [facility]) CM/SW Contact  Erin Sons, [place] Phone Number: 11/10/2023, 10:25 AM  Clinical Narrative:     CSW [facility] and requested to speak to Ginger. Ginger is out today; CSW is transferred to care Production designer, theatre/television/film. CSW provided contact info and pt info; care manager will contact CSW later for update.   Expected Discharge Plan: Long Term Nursing Home Barriers to Discharge: Continued Medical Work up  Expected Discharge Plan and Services In-house Referral: Clinical Social Work Discharge Planning Services: NA Post Acute Care Choice: Skilled Nursing Facility Living arrangements for the past 2 months: Single Family Home (Unsafe to return)                   DME Agency: NA     Representative spoke with at DME Agency: N/A HH Arranged: NA           Social Determinants of Health (SDOH) Interventions SDOH Screenings   Food Insecurity: No Food Insecurity (08/21/2023)  Housing: Medium Risk (08/21/2023)  Transportation Needs: No Transportation Needs (08/21/2023)  Utilities: Not At Risk (08/21/2023)  Financial Resource Strain: Low Risk  (02/04/2023)   Received from [facility], [facility]  Physical Activity: Inactive (07/16/2021)   Received from [facility], [facility]  Stress: No Stress Concern Present (07/16/2021)   Received from [facility], [facility]  Tobacco Use: Medium Risk (09/04/2023)  Health Literacy: Medium Risk (07/16/2021)   Received from [facility], [facility]    Readmission Risk Interventions     No data to display

## 2023-11-10 NOTE — ED Notes (Signed)
 Pt out of room yelling at staff. Pt wanting to be out in hall to sit with another pt. Pt stating she is in love with another pt and agitated that she is not allowed to sit with pt. RN utilizing therapeutic communication techniques - pt not redirectable but yelling for staff to leave room.

## 2023-11-10 NOTE — ED Notes (Signed)
 Pt remains agitated. Pt frequently exhibiting attention-seeking behaviors - calling out for help,  seating self on floor, and shaking limbs "uncontrollably". Pt not open to interactions with staff and difficult to redirect.

## 2023-11-10 NOTE — ED Notes (Signed)
 Attempted to get Pts vitals again; Pt refused, cursed at this tech.  Will attempt again at later time.

## 2023-11-10 NOTE — ED Notes (Signed)
 Pt continues to yell out profanities from room. Pt is also clapping her hands while yelling profanities.

## 2023-11-10 NOTE — ED Provider Notes (Signed)
 Emergency Medicine Observation Re-evaluation Note  Tracy Dickerson is a 83 y.o. female, seen on rounds today.  Pt initially presented to the ED for complaints of Agitation    Physical Exam  BP 135/80 (BP Location: Left Arm)   Pulse 79   Temp 98.1 F (36.7 C) (Oral)   Resp 16   Ht 5\' 6"  (1.676 m)   Wt 90.7 kg   SpO2 95%   BMI 32.28 kg/m  Physical Exam General: nad  ED Course / MDM  EKG:   I have reviewed the labs performed to date as well as medications administered while in observation.    Plan  Current plan is for toc.    Willy Eddy, MD 11/10/23 [phone]

## 2023-11-11 NOTE — Progress Notes (Signed)
 Mobility Specialist - Progress Note    11/11/23 1600  Mobility  Activity Ambulated independently in hallway  Level of Assistance Modified independent, requires aide device or extra time  Assistive Device Front wheel walker  Distance Ambulated (ft) 420 ft  Range of Motion/Exercises Active  Activity Response Tolerated well  Mobility Referral Yes  Mobility visit 1 Mobility  Mobility Specialist Start Time (ACUTE ONLY) 1600  Mobility Specialist Stop Time (ACUTE ONLY) 1615  Mobility Specialist Time Calculation (min) (ACUTE ONLY) 15 min     [employer] Mobility Specialist 11/11/23, 4:16 PM

## 2023-11-11 NOTE — ED Notes (Signed)
 Patient continually coming out of room and attempting to touch another patient in the hall and lay on the other patient's bed. Patient has been redirected multiple times by staff. Patient slamming walker onto ground and cussing at staff. Patient continues to cuss at staff and slam walker while in room. IM medication given as patient refusing to take oral medication. Security held patient's hand for comfort during administration. Patient cussing at this RN saying and stating "I'm gonna sue you bitches". Patient ambulatory to bathroom at this time.

## 2023-11-11 NOTE — ED Notes (Signed)
 Sitting in recliner of room yelling explicit language to staff as they walk by.

## 2023-11-11 NOTE — ED Notes (Signed)
 Pt out into hallway, trying to leave hospital. Attempted to verbally redirect and reorient patient with no success. BPD and RN explained to patient that she needs to stay within hospital as she is currently a patient. Pt continues to elevate, and yell at staff, picking up walker and slamming it down. Pt knocked breakfast tray out of staff hand.

## 2023-11-11 NOTE — ED Notes (Signed)
 Dinner tray provided for pt

## 2023-11-11 NOTE — ED Notes (Signed)
Lunch placed at bedside °

## 2023-11-11 NOTE — ED Notes (Signed)
 Pt walking out in hallway, states that she is leaving today to go see another doctor. Attempted to reorient. Pt irritable and argumentative with staff about reality of situation. Pt continues to fixate towards pt in hallway bed.

## 2023-11-11 NOTE — ED Notes (Signed)
 Patient received hospital lunch tray.

## 2023-11-11 NOTE — ED Provider Notes (Signed)
-----------------------------------------   5:37 AM on 11/11/2023 -----------------------------------------   Blood pressure 124/74, pulse 98, temperature 98.7 F (37.1 C), temperature source Oral, resp. rate (!) 21, height 5\' 6"  (1.676 m), weight 90.7 kg, SpO2 98%.  The patient is calm and cooperative at this time.  There have been no acute events since the last update.  Awaiting disposition plan from Social Work team.   Irean Hong, MD 11/11/23 [phone]

## 2023-11-11 NOTE — ED Notes (Addendum)
 Pt banging on walls of hallway and in room. Pt cursing staff, attempting to leave facility and unable to be reoriented or verbally de-escalated. PRN IM administered without difficulties while pt sitting in recliner chair of room to ensure pt and staff safety as pt is now attempting to destroy hospital property.

## 2023-11-11 NOTE — ED Notes (Signed)
 Pt walking into hallway, attempting to leave facility. RN explained again that pt is in the hospital's care and cannot leave facility for safety reasons at this time. Pt back to room, cursing and banging walker on ground. Refused breakfast tray. Pt threatening to urinate on herself. Will continue to monitor.

## 2023-11-11 NOTE — TOC Progression Note (Signed)
 Transition of Care [facility]) - Progression Note    Patient Details  Name: Tracy Dickerson MRN: [id number] Date of Birth: [birth year]  Transition [facility] CM/SW Contact  Erin Sons, [place] Phone Number: 11/11/2023, 10:16 AM  Clinical Narrative:     Called and left voicemail with [facility].   Expected Discharge Plan: Long Term Nursing Home Barriers to Discharge: Continued Medical Work up  Expected Discharge Plan and Services In-house Referral: Clinical Social Work Discharge Planning Services: NA Post Acute Care Choice: Skilled Nursing Facility Living arrangements for the past 2 months: Single Family Home (Unsafe to return)                   DME Agency: NA     Representative spoke with at DME Agency: N/A HH Arranged: NA           Social Determinants of Health (SDOH) Interventions SDOH Screenings   Food Insecurity: No Food Insecurity (08/21/2023)  Housing: Medium Risk (08/21/2023)  Transportation Needs: No Transportation Needs (08/21/2023)  Utilities: Not At Risk (08/21/2023)  Financial Resource Strain: Low Risk  (02/04/2023)   Received from [facility], [facility]  Physical Activity: Inactive (07/16/2021)   Received from [facility], [facility]  Stress: No Stress Concern Present (07/16/2021)   Received from [facility], [facility]  Tobacco Use: Medium Risk (09/04/2023)  Health Literacy: Medium Risk (07/16/2021)   Received from [facility], [facility]    Readmission Risk Interventions     No data to display

## 2023-11-11 NOTE — ED Notes (Signed)
 Patient came out of room attempting to sit on another patients bed once again. Patient informed of the rules again, continues to cuss at staff and call this RN "mother fucker". Patient then demanding ice cream, informed staff would be able to give her ice cream once she stopped cussing at staff. Patient stated "you bitch". No ice cream provided at this time due to continued verbal abuse from patient.

## 2023-11-11 NOTE — ED Notes (Signed)
 Pt ambulating down hallway towards pt in hallway bed. Pt trying to awake pt in hallway bed. Instructed to leave other pt's alone, allow their privacy and keep hands to herself. Pt becomes irritable but walks back to room with walker independently.

## 2023-11-11 NOTE — ED Notes (Signed)
 Patient continues to come out into hall and attempt to sit on 20hall bed with another patient in it. This RN having to step in between patient and another patient. Patient continues to call this RN "mother fucker".

## 2023-11-11 NOTE — ED Notes (Signed)
 Pt out of room, ambulating down hallway. Pt stating she is leaving to go to another doctor's appt. RN informed pt is not up for discharge, and at this time is still a pt of the hospital. Pt attempting to touch another pt in the hallway, RN stopped pt and informed her that this is not appropriate is hospital environment. Instructed that she must remain in room at this time due to her irritable physical and verbal behaviors. Pt back in room at this time.

## 2023-11-12 NOTE — ED Notes (Signed)
 Pt ambulatory to BR at this time. Pt had BM.

## 2023-11-12 NOTE — Progress Notes (Signed)
 Mobility Specialist - Progress Note    11/12/23 1538  Mobility  Activity Ambulated independently in hallway  Level of Assistance Modified independent, requires aide device or extra time  Assistive Device Front wheel walker  Distance Ambulated (ft) 450 ft  Range of Motion/Exercises Active  Activity Response Tolerated well  Mobility Referral Yes  Mobility visit 1 Mobility  Mobility Specialist Start Time (ACUTE ONLY) 1522  Mobility Specialist Stop Time (ACUTE ONLY) 1538  Mobility Specialist Time Calculation (min) (ACUTE ONLY) 16 min   Pt resting in bed on RA upon entry. Pt STS and ambulates to hallway around NS with RW. Pt returned to bed and left with needs in reach.   Johnathan Hausen Mobility Specialist 11/12/23, 3:40 PM

## 2023-11-12 NOTE — ED Notes (Signed)
 Snack given by EDT.

## 2023-11-12 NOTE — ED Notes (Signed)
 VOL/TOC Placement

## 2023-11-12 NOTE — ED Notes (Signed)
 Pt currently sleeping will give snack when awake

## 2023-11-12 NOTE — TOC Progression Note (Signed)
 Transition of Care [facility]) - Progression Note    Patient Details  Name: Tracy Dickerson MRN: [id number] Date of Birth: Apr 16, 1941  Transition of Care [facility]) CM/SW Contact  Erin Sons, [place] Phone Number: 11/12/2023, 9:34 AM  Clinical Narrative:     Called and left voicemail with Ginger at [facility] requesting return call.   Expected Discharge Plan: Long Term Nursing Home Barriers to Discharge: Continued Medical Work up  Expected Discharge Plan and Services In-house Referral: Clinical Social Work Discharge Planning Services: NA Post Acute Care Choice: Skilled Nursing Facility Living arrangements for the past 2 months: Single Family Home (Unsafe to return)                   DME Agency: NA     Representative spoke with at DME Agency: N/A HH Arranged: NA           Social Determinants of Health (SDOH) Interventions SDOH Screenings   Food Insecurity: No Food Insecurity (08/21/2023)  Housing: Medium Risk (08/21/2023)  Transportation Needs: No Transportation Needs (08/21/2023)  Utilities: Not At Risk (08/21/2023)  Financial Resource Strain: Low Risk  (02/04/2023)   Received from [facility], [facility]  Physical Activity: Inactive (07/16/2021)   Received from [facility], [facility]  Stress: No Stress Concern Present (07/16/2021)   Received from [facility], [facility]  Tobacco Use: Medium Risk (09/04/2023)  Health Literacy: Medium Risk (07/16/2021)   Received from [facility], [facility]    Readmission Risk Interventions     No data to display

## 2023-11-12 NOTE — ED Provider Notes (Signed)
-----------------------------------------   5:36 AM on 11/12/2023 -----------------------------------------   Blood pressure (!) 150/99, pulse (!) 104, temperature 97.6 F (36.4 C), temperature source Oral, resp. rate 18, height 5\' 6"  (1.676 m), weight 90.7 kg, SpO2 99%.  The patient is calm and cooperative at this time.  There have been no acute events since the last update.  Awaiting disposition plan from Social Work team.   Irean Hong, MD 11/12/23 [phone]

## 2023-11-12 NOTE — ED Notes (Signed)
VOL TOC placement 

## 2023-11-12 NOTE — ED Notes (Signed)
 Hospital meal provided.  100% consumed, pt tolerated w/o complaints.  Waste discarded appropriately.

## 2023-11-12 NOTE — ED Notes (Signed)
 Pt provided meal. Pt's breakfast on bedside table. Pt asleep at this time.

## 2023-11-13 NOTE — ED Notes (Signed)
 Assisted patient to the bathroom using her walker. Patient is now resting in bed.

## 2023-11-13 NOTE — ED Notes (Signed)
 Pt taking shower. Pt was given hygiene items and the following, 1 clean top, 1 clean bottom, with 1 pair of disposable underwear.  Pt changed out into clean clothing.  Staff disposed of all shower supplies.

## 2023-11-13 NOTE — ED Notes (Signed)
 Pt provided with breakfast tray.

## 2023-11-13 NOTE — ED Provider Notes (Signed)
 Emergency Medicine Observation Re-evaluation Note  Physical Exam   BP 124/66 (BP Location: Left Arm)   Pulse 89   Temp 98 F (36.7 C) (Oral)   Resp 19   Ht 5\' 6"  (1.676 m)   Wt 90.7 kg   SpO2 90%   BMI 32.28 kg/m   Patient appears in no acute distress.  ED Course / MDM   No reported events during my shift at the time of this note.   Pt is awaiting dispo from consultants   Pilar Jarvis MD    Pilar Jarvis, MD 11/13/23 [phone]

## 2023-11-13 NOTE — ED Notes (Signed)
 Patient provided snack at appropriate snack time.  Pt consumed 100% of snack provided, tolerated well w/o complaints   Trash disposted of appropriately by patient.

## 2023-11-13 NOTE — ED Notes (Signed)
 Pt was given a sandwich tray and cola. Pt has no complaints at this time.

## 2023-11-13 NOTE — ED Notes (Signed)
 Vol/toc.Marland Kitchen

## 2023-11-13 NOTE — ED Notes (Signed)
 Hospital meal provided, pt tolerated w/o complaints.  Waste discarded appropriately.

## 2023-11-14 NOTE — ED Notes (Signed)
 Meal provided. Pt resting.

## 2023-11-14 NOTE — TOC Progression Note (Signed)
 Transition of Care [facility]) - Progression Note    Patient Details  Name: Tracy Dickerson MRN: [id number] Date of Birth: 1941/01/11  Transition of Care [facility]) CM/SW Contact  Avyonna Wagoner Aris Lot, [place] Phone Number: 11/14/2023, 10:00 AM  Clinical Narrative:     CSW called [facility] and spoke with Ginger requesting update on referral. She explains she does not remembers referral for this pt. She states she will talk with their Librarian, academic to see if they had reviewed referral. CSW re-sent referral to [email or web address].  Expected Discharge Plan: Long Term Nursing Home Barriers to Discharge: Continued Medical Work up  Expected Discharge Plan and Services In-house Referral: Clinical Social Work Discharge Planning Services: NA Post Acute Care Choice: Skilled Nursing Facility Living arrangements for the past 2 months: Single Family Home (Unsafe to return)                   DME Agency: NA     Representative spoke with at DME Agency: N/A HH Arranged: NA           Social Determinants of Health (SDOH) Interventions SDOH Screenings   Food Insecurity: No Food Insecurity (08/21/2023)  Housing: Medium Risk (08/21/2023)  Transportation Needs: No Transportation Needs (08/21/2023)  Utilities: Not At Risk (08/21/2023)  Financial Resource Strain: Low Risk  (02/04/2023)   Received from [facility], [facility]  Physical Activity: Inactive (07/16/2021)   [facility] from [facility], [facility]  Stress: No Stress Concern Present (07/16/2021)   Received from [facility], [facility]  Tobacco Use: Medium Risk (09/04/2023)  Health Literacy: Medium Risk (07/16/2021)   Received from [facility], [facility]    Readmission Risk Interventions     No data to display

## 2023-11-14 NOTE — ED Provider Notes (Signed)
 Emergency Medicine Observation Re-evaluation Note  Physical Exam   BP 115/64   Pulse 84   Temp 98 F (36.7 C) (Oral)   Resp 19   Ht 5\' 6"  (1.676 m)   Wt 90.7 kg   SpO2 95%   BMI 32.28 kg/m   Patient appears in no acute distress.  ED Course / MDM   No reported events during my shift at the time of this note.   Pt is awaiting dispo from consultants   Pilar Jarvis MD    Pilar Jarvis, MD 11/14/23 [phone]

## 2023-11-14 NOTE — ED Notes (Signed)
 VOL/TOC Placement

## 2023-11-14 NOTE — ED Notes (Signed)
 Breakfast provided.

## 2023-11-15 NOTE — ED Notes (Signed)
 Patient meal tray provided

## 2023-11-15 NOTE — ED Provider Notes (Signed)
-----------------------------------------   6:33 AM on 11/15/2023 -----------------------------------------   Blood pressure 133/76, pulse 72, temperature 98.2 F (36.8 C), temperature source Oral, resp. rate 19, height 5\' 6"  (1.676 m), weight 90.7 kg, SpO2 96%.  The patient is calm and cooperative at this time.  There have been no acute events since the last update.  Awaiting disposition plan from Social Work team.   Irean Hong, MD 11/15/23 [phone]

## 2023-11-15 NOTE — ED Notes (Signed)
vol/toc placement.. 

## 2023-11-15 NOTE — TOC Progression Note (Signed)
 Transition of Care [facility]) - Progression Note    Patient Details  Name: Tracy Dickerson MRN: [id number] Date of Birth: Mar 28, 1941  Transition of Care [facility]) CM/SW Contact  Liliana Cline, LCSW Phone Number: 11/15/2023, 9:15 AM  Clinical Narrative:    CSW reached out to Ginger at [facility] to request update on referral that was sent by weekday CSW. Awaiting response.   Expected Discharge Plan: Long Term Nursing Home Barriers to Discharge: Continued Medical Work up  Expected Discharge Plan and Services In-house Referral: Clinical Social Work Discharge Planning Services: NA Post Acute Care Choice: Skilled Nursing Facility Living arrangements for the past 2 months: Single Family Home (Unsafe to return)                   DME Agency: NA     Representative spoke with at DME Agency: N/A HH Arranged: NA           Social Determinants of Health (SDOH) Interventions SDOH Screenings   Food Insecurity: No Food Insecurity (08/21/2023)  Housing: Medium Risk (08/21/2023)  Transportation Needs: No Transportation Needs (08/21/2023)  Utilities: Not At Risk (08/21/2023)  Financial Resource Strain: Low Risk  (02/04/2023)   Received from [facility], [facility]  Physical Activity: Inactive (07/16/2021)   Received from [facility], [facility]  Stress: No Stress Concern Present (07/16/2021)   Received from [facility], [facility]  Tobacco Use: Medium Risk (09/04/2023)  Health Literacy: Medium Risk (07/16/2021)   Received from [facility], [facility]    Readmission Risk Interventions     No data to display

## 2023-11-15 NOTE — ED Notes (Signed)
 This NT provided pt with pm snack.

## 2023-11-15 NOTE — ED Notes (Signed)
Patient is vol pending TOC placement 

## 2023-11-15 NOTE — ED Notes (Addendum)
 Pt provided with dinner tray. Pt did not want to eat. Pt refused to leave tray in her room. Tray is sitting on counter at nurses desk.

## 2023-11-16 NOTE — ED Notes (Signed)
 Pt given pm snack and beverage.

## 2023-11-16 NOTE — ED Notes (Signed)
Pt refused shower.  

## 2023-11-16 NOTE — ED Notes (Signed)
 Pt provided with lunch tray. Pt sitting up eating.

## 2023-11-16 NOTE — ED Notes (Signed)
 Pt assisted to the restroom without incident. Pt assisted onto the recliner in her room instead of getting back in bed per her request.

## 2023-11-16 NOTE — ED Provider Notes (Signed)
-----------------------------------------   6:05 AM on 11/16/2023 -----------------------------------------   Blood pressure 116/69, pulse 64, temperature 97.7 F (36.5 C), temperature source Oral, resp. rate 17, height 5\' 6"  (1.676 m), weight 90.7 kg, SpO2 98%.  The patient is calm and cooperative at this time.  There have been no acute events since the last update.  Awaiting disposition plan from Social Work team.   Irean Hong, MD 11/16/23 [phone]

## 2023-11-16 NOTE — ED Notes (Signed)
 Breakfast tray provided.

## 2023-11-16 NOTE — ED Notes (Signed)
 VOL  PENDING  TOC  PLACEMENT

## 2023-11-16 NOTE — ED Notes (Signed)
 Area cleaned of trash.

## 2023-11-17 NOTE — ED Notes (Signed)
 Pt received snack and drink

## 2023-11-17 NOTE — ED Notes (Signed)
 Vol  pending  TOC  placement

## 2023-11-17 NOTE — ED Notes (Signed)
 Pt in room repeatedly saying "bitch, bitch, bitch, bitch, bitch" when this EDT walked by to do 15 min rounds.

## 2023-11-17 NOTE — ED Notes (Signed)
 Lunch provided.

## 2023-11-17 NOTE — TOC Progression Note (Addendum)
 Transition of Care [facility]) - Progression Note    Patient Details  Name: Tracy Dickerson MRN: [id number] Date of Birth: Oct 15, 1940  Transition of Care [facility]) CM/SW Contact  Margarito Liner, LCSW Phone Number: 11/17/2023, 9:11 AM  Clinical Narrative:   CSW spoke to Ginger at [facility]. They will discuss the referral in their morning meeting and she will call CSW back.  1:17 pm: [facility] is unable to offer a bed.  Expected Discharge Plan: Long Term Nursing Home Barriers to Discharge: Continued Medical Work up  Expected Discharge Plan and Services In-house Referral: Clinical Social Work Discharge Planning Services: NA Post Acute Care Choice: Skilled Nursing Facility Living arrangements for the past 2 months: Single Family Home (Unsafe to return)                   DME Agency: NA     Representative spoke with at DME Agency: N/A HH Arranged: NA           Social Determinants of Health (SDOH) Interventions SDOH Screenings   Food Insecurity: No Food Insecurity (08/21/2023)  Housing: Medium Risk (08/21/2023)  Transportation Needs: No Transportation Needs (08/21/2023)  Utilities: Not At Risk (08/21/2023)  Financial Resource Strain: Low Risk  (02/04/2023)   Received from [facility], [facility]  Physical Activity: Inactive (07/16/2021)   Received from [facility], [facility]  Stress: No Stress Concern Present (07/16/2021)   Received from [facility], [facility]  Tobacco Use: Medium Risk (09/04/2023)  Health Literacy: Medium Risk (07/16/2021)   Received from [facility], [facility]    Readmission Risk Interventions     No data to display

## 2023-11-17 NOTE — ED Provider Notes (Signed)
-----------------------------------------   7:17 AM on 11/17/2023 -----------------------------------------   Blood pressure 134/71, pulse 63, temperature 97.7 F (36.5 C), temperature source Oral, resp. rate 17, height 1.676 m (5\' 6" ), weight 90.7 kg, SpO2 100%.  The patient is calm and cooperative at this time.  There have been no acute events since the last update.  Awaiting disposition plan from [facility] team.   Loleta Rose, MD 11/17/23 [phone]

## 2023-11-17 NOTE — ED Notes (Signed)
 Pt in hallway. This EDT told pt to go back to her room due to it being late at night and other pts asleep in the hallway. Pt to this EDT to "go to hell, you bitch." Pt back in room at this time.

## 2023-11-17 NOTE — ED Notes (Signed)
 Pt ambulated to bathroom to preform ADL's no assistance required

## 2023-11-18 LAB — CBC
HCT: 37.1 % (ref 36.0–46.0)
Hemoglobin: 13 g/dL (ref 12.0–15.0)
MCH: 32.7 pg (ref 26.0–34.0)
MCHC: 35 g/dL (ref 30.0–36.0)
MCV: 93.5 fL (ref 80.0–100.0)
Platelets: 176 10*3/uL (ref 150–400)
RBC: 3.97 MIL/uL (ref 3.87–5.11)
RDW: 17.1 % — ABNORMAL HIGH (ref 11.5–15.5)
WBC: 6.7 10*3/uL (ref 4.0–10.5)
nRBC: 0 % (ref 0.0–0.2)

## 2023-11-18 NOTE — ED Notes (Signed)
 This tech obtained vital signs on pt.

## 2023-11-18 NOTE — ED Notes (Signed)
 Vol toc placement

## 2023-11-18 NOTE — ED Notes (Signed)
 Hospital meal provided.  100% consumed, pt tolerated w/o complaints.  Waste discarded appropriately.

## 2023-11-18 NOTE — ED Notes (Signed)
 Snack waste discarded at this time.

## 2023-11-18 NOTE — ED Provider Notes (Signed)
 Emergency Medicine Observation Re-evaluation Note  Tracy Dickerson is a 83 y.o. female, seen on rounds today.  Pt initially presented to the ED for complaints of Agitation Currently, the patient is resting.  Physical Exam  BP 138/76 (BP Location: Left Arm)   Pulse (!) 59   Temp 98.1 F (36.7 C) (Oral)   Resp 17   Ht 5\' 6"  (1.676 m)   Wt 90.7 kg   SpO2 93%   BMI 32.28 kg/m  Physical Exam .Gen:  No acute distress Resp:  Breathing easily and comfortably, no accessory muscle usage Neuro:  Moving all four extremities, no gross focal neuro deficits Psych:  Resting currently, calm when awake   ED Course / MDM  EKG:   I have reviewed the labs performed to date as well as medications administered while in observation.  Recent changes in the last 24 hours include no acute events.  Plan  Current plan is for placement.    Claybon Jabs, MD 11/18/23 [phone]

## 2023-11-18 NOTE — ED Notes (Signed)
 Pt refused to wear a gait belt while ambulating from restroom to bedroom. Pt stated she was not going to fall and that she was going to use her walker to get back.

## 2023-11-18 NOTE — ED Notes (Signed)
Hospital meal provided, pt tolerated w/o complaints.

## 2023-11-18 NOTE — ED Notes (Signed)
 Pt given snack and beverage.

## 2023-11-18 NOTE — Progress Notes (Signed)
 Mobility Specialist - Progress Note     11/18/23 1037  Mobility  Activity Ambulated independently in hallway;Stood at bedside  Level of Assistance Modified independent, requires aide device or extra time  Assistive Device Front wheel walker  Distance Ambulated (ft) 800 ft  Range of Motion/Exercises Active  Mobility Referral Yes  Mobility Specialist Start Time (ACUTE ONLY) 1012  Mobility Specialist Stop Time (ACUTE ONLY) 1028  Mobility Specialist Time Calculation (min) (ACUTE ONLY) 16 min   Pt resting in bed on RA upon entry. Pt STS and ambulates to hallway around NS ModI with RW. Pt returned to bed and left with needs in reach.   Johnathan Hausen Mobility Specialist 11/18/23, 10:40 AM

## 2023-11-18 NOTE — ED Notes (Signed)
 Patient dinner tray disposed of at this time.

## 2023-11-19 NOTE — ED Notes (Signed)
 Dinner tray provided to patient

## 2023-11-19 NOTE — ED Provider Notes (Signed)
-----------------------------------------   4:26 AM on 11/19/2023 -----------------------------------------   Blood pressure 126/73, pulse 64, temperature 98.1 F (36.7 C), temperature source Oral, resp. rate 18, height 5\' 6"  (1.676 m), weight 90.7 kg, SpO2 98%.  The patient is calm and cooperative at this time.  There have been no acute events since the last update.  Awaiting disposition plan from case management/social work.    Cheo Selvey, Layla Maw, DO 11/19/23 [phone]

## 2023-11-19 NOTE — ED Notes (Signed)
Pt requested shower; provided clean hospital clothing and linens.  Shower setup provided with soap, shampoo, toothbrush/toothpaste, and deodorant.  Pt able to preform own ADL's with no assistance.    

## 2023-11-19 NOTE — ED Notes (Signed)
 VOL Pending TOC placement

## 2023-11-19 NOTE — ED Notes (Signed)
 Breakfast provided.

## 2023-11-19 NOTE — ED Notes (Signed)
 Pt was given snack at this time by this tech

## 2023-11-20 NOTE — ED Notes (Signed)
VOL/pending placement 

## 2023-11-20 NOTE — ED Notes (Signed)
VOL/Pending Placement 

## 2023-11-20 NOTE — NC FL2 (Signed)
 [place] MEDICAID FL2 LEVEL OF CARE FORM     IDENTIFICATION  Patient Name: Tracy Dickerson Birthdate: 06/04/1941 Sex: female Admission Date (Current Location): 09/04/2023  [place] and [place] Number:  Chiropodist and Address:  [facility], [address], [place], [place] [address]      Provider Number: [id number]  Attending Physician Name and Address:  Sharman Cheek, MD  Relative Name and Phone Number:       Current Level of Care: Hospital Recommended Level of Care: Memory Care Prior Approval Number:    Date Approved/Denied:   PASRR Number:    Discharge Plan: Other (Comment) (Memory Care)    Current Diagnoses: Patient Active Problem List   Diagnosis Date Noted   Low serum cortisol level 09/02/2023   Headache 08/30/2023   Vitamin D deficiency 08/28/2023   Dizziness 08/25/2023   Iron deficiency anemia 08/23/2023   Iron deficiency anemia due to chronic blood loss 08/21/2023   Syncope 08/20/2023   Normocytic anemia 08/20/2023   Obesity (BMI 30-39.9) 08/20/2023   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/2023   Vitamin B12 deficiency 06/20/2023   Carotid stenosis 06/20/2023   Dementia (HCC) 06/19/2023   Generalized weakness 06/18/2023   Acute on chronic anemia 06/18/2023   H/O: upper GI bleed 06/18/2023   Chronic anticoagulation 06/18/2023   Infestation by bed bug 06/18/2023   CKD stage 3a, GFR 45-59 ml/min (HCC) 01/07/2023   Overweight (BMI 25.0-29.9) 01/06/2023   Type 2 diabetes mellitus with hyperlipidemia (HCC) 01/06/2023   COPD (chronic obstructive pulmonary disease) (HCC) 01/06/2023   Hypokalemia 01/06/2023   UGIB (upper gastrointestinal bleed) 01/05/2023   Unsatisfactory living conditions 01/05/2023   Constipation    UTI (urinary tract infection) 08/13/2020   HLD (hyperlipidemia) 08/13/2020   Acute renal failure superimposed on stage 3a chronic kidney disease (HCC) 08/13/2020   PAF (paroxysmal  atrial fibrillation) (HCC) 08/13/2020   HTN (hypertension) 08/13/2020   Dehydration    Dehydration, moderate    Acute renal failure (HCC) 04/22/2020   Hyperkalemia    Septic shock (HCC)    Sepsis due to urinary tract infection (HCC) 03/13/2018   Acute kidney injury superimposed on chronic kidney disease (HCC) 02/13/2018   Closed extra-articular fracture of distal tibia 02/02/2018   Community acquired pneumonia 10/29/2017   Orthostatic hypotension 01/26/2017   Lung nodule 05/30/2014   Sleep apnea 05/10/2014   Barrett's esophagus 05/13/2007   Malignant neoplasm of breast (female) (HCC) 06/01/1997    Orientation RESPIRATION BLADDER Height & Weight     Self, Place  Normal Continent Weight: 200 lb (90.7 kg) Height:  5\' 6"  (167.6 cm)  BEHAVIORAL SYMPTOMS/MOOD NEUROLOGICAL BOWEL NUTRITION STATUS   (Irritable at times)  (Dementia) Continent Diet (Regular)  AMBULATORY STATUS COMMUNICATION OF NEEDS Skin   Supervision Verbally Bruising                       Personal Care Assistance Level of Assistance  Bathing, Feeding, Dressing Bathing Assistance: Limited assistance Feeding assistance: Limited assistance Dressing Assistance: Limited assistance     Functional Limitations Info  Sight, Hearing, Speech Sight Info: Adequate Hearing Info: Adequate Speech Info: Adequate    SPECIAL CARE FACTORS FREQUENCY                       Contractures Contractures Info: Not present    Additional Factors Info  Code Status, Allergies Code Status Info: Full code Allergies Info:  Oxycodone, Percocet (Oxycodone-acetaminophen), Penicillins           Current Medications (11/20/2023):  This is the current hospital active medication list Current Facility-Administered Medications  Medication Dose Route Frequency Provider Last Rate Last Admin   acetaminophen (TYLENOL) tablet 1,000 mg  1,000 mg Oral Q6H PRN Delton Prairie, MD   1,000 mg at 11/03/23 2104   acetaminophen (TYLENOL) tablet 500  mg  500 mg Oral Q6H PRN Concha Se, MD   500 mg at 11/10/23 2333   albuterol (VENTOLIN HFA) 108 (90 Base) MCG/ACT inhaler 2 puff  2 puff Inhalation Q4H PRN Concha Se, MD       apixaban Everlene Balls) tablet 5 mg  5 mg Oral BID Concha Se, MD   5 mg at 11/20/23 1103   atorvastatin (LIPITOR) tablet 20 mg  20 mg Oral Daily Concha Se, MD   20 mg at 11/20/23 1103   bisacodyl (DULCOLAX) suppository 10 mg  10 mg Rectal Daily PRN Concha Se, MD   10 mg at 10/24/23 2349   gabapentin (NEURONTIN) capsule 600 mg  600 mg Oral QHS Concha Se, MD   600 mg at 11/19/23 2126   haloperidol (HALDOL) tablet 5 mg  5 mg Oral Q6H PRN Sharman Cheek, MD   5 mg at 11/03/23 1510   haloperidol lactate (HALDOL) injection 2.5 mg  2.5 mg Intramuscular Q6H PRN Concha Se, MD   2.5 mg at 11/11/23 1846   hydrocortisone (CORTEF) tablet 5 mg  5 mg Oral Daily Concha Se, MD   5 mg at 11/20/23 1105   hydrOXYzine (ATARAX) tablet 10 mg  10 mg Oral TID PRN Concha Se, MD       melatonin tablet 5 mg  5 mg Oral QHS Corena Herter, MD   5 mg at 11/19/23 2126   ondansetron (ZOFRAN-ODT) disintegrating tablet 4 mg  4 mg Oral Q8H PRN Concha Se, MD   4 mg at 10/31/23 2143   pantoprazole (PROTONIX) EC tablet 40 mg  40 mg Oral BID Concha Se, MD   40 mg at 11/20/23 1103   Current Outpatient Medications  Medication Sig Dispense Refill   acetaminophen (TYLENOL) 500 MG tablet Take 500 mg by mouth every 6 (six) hours as needed.     albuterol (PROAIR HFA) 108 (90 Base) MCG/ACT inhaler Inhale 2 puffs into the lungs every 4 (four) hours as needed for wheezing or shortness of breath.      apixaban (ELIQUIS) 5 MG TABS tablet Take 1 tablet (5 mg total) by mouth 2 (two) times daily. 90 tablet 1   atorvastatin (LIPITOR) 20 MG tablet Take 20 mg by mouth daily.     bisacodyl (DULCOLAX) 10 MG suppository Place 1 suppository (10 mg total) rectally daily as needed for severe constipation.     cetirizine (ZYRTEC) 5 MG tablet Take  1 tablet (5 mg total) by mouth daily. 30 tablet 2   cyanocobalamin (VITAMIN B12) 1000 MCG tablet Take 1 tablet (1,000 mcg total) by mouth daily. 90 tablet 1   fluticasone-salmeterol (ADVAIR) 250-50 MCG/ACT AEPB Inhale 1 puff into the lungs in the morning and at bedtime.     folic acid (FOLVITE) 1 MG tablet Take 1 tablet (1 mg total) by mouth daily.     gabapentin (NEURONTIN) 300 MG capsule Take 2 capsules (600 mg total) by mouth at bedtime.     hydrocortisone (CORTEF) 5 MG tablet Take 1 tablet (5 mg  total) by mouth daily.     hydrOXYzine (ATARAX) 10 MG tablet Take 1 tablet (10 mg total) by mouth 3 (three) times daily as needed for itching.     meclizine (ANTIVERT) 12.5 MG tablet Take 1 tablet (12.5 mg total) by mouth 3 (three) times daily as needed for dizziness. 30 tablet 0   midodrine (PROAMATINE) 5 MG tablet Take 1 tablet (5 mg total) by mouth 3 (three) times daily with meals.     ondansetron (ZOFRAN ODT) 4 MG disintegrating tablet Take 1 tablet (4 mg total) by mouth every 8 (eight) hours as needed. 15 tablet 0   pantoprazole (PROTONIX) 40 MG tablet Take 1 tablet (40 mg total) by mouth 2 (two) times daily. 180 tablet 1   SPIRIVA HANDIHALER 18 MCG inhalation capsule Place 1 capsule (18 mcg total) into inhaler and inhale daily. 30 capsule 2   traMADol (ULTRAM) 50 MG tablet Take 1 tablet (50 mg total) by mouth 2 (two) times daily as needed. 30 tablet 0   triamcinolone ointment (KENALOG) 0.1 % Apply 1 Application topically 2 (two) times daily.       Discharge Medications: Please see discharge summary for a list of discharge medications.  Relevant Imaging Results:  Relevant Lab Results:   Additional Information SS#: [id number]  Margarito Liner, LCSW

## 2023-11-20 NOTE — TOC Progression Note (Signed)
 Transition of Care [facility]) - Progression Note    Patient Details  Name: Tracy Dickerson MRN: [id number] Date of Birth: July 18, 1941  Transition of Care [facility]) CM/SW Contact  Margarito Liner, LCSW Phone Number: 11/20/2023, 11:17 AM  Clinical Narrative:   Discussed case in ED care meeting this morning. CSW reaching out to several facilities that were mentioned. Sent referral in a secure email to Catering manager at [facility] in [place]. [facility] in [place] said they do not have memory care. Left voicemails at [facility] in [place], [facility] in [place], and [facility] in [place]. CSW spoke to Nia with [facility] DSS. Court hearing for guardianship is scheduled for Tuesday 3/4.   Expected Discharge Plan: Long Term Nursing Home Barriers to Discharge: Continued Medical Work up  Expected Discharge Plan and Services In-house Referral: Clinical Social Work Discharge Planning Services: NA Post Acute Care Choice: Skilled Nursing Facility Living arrangements for the past 2 months: Single Family Home (Unsafe to return)                   DME Agency: NA     Representative spoke with at DME Agency: N/A HH Arranged: NA           Social Determinants of Health (SDOH) Interventions SDOH Screenings   Food Insecurity: No Food Insecurity (08/21/2023)  Housing: Medium Risk (08/21/2023)  Transportation Needs: No Transportation Needs (08/21/2023)  Utilities: Not At Risk (08/21/2023)  Financial Resource Strain: Low Risk  (02/04/2023)   Received from [facility], [facility]  Physical Activity: Inactive (07/16/2021)   Received from [facility], [facility]  Stress: No Stress Concern Present (07/16/2021)   Received from [facility], [facility]  Tobacco Use: Medium Risk (09/04/2023)  Health Literacy: Medium Risk (07/16/2021)   Received from [facility], [facility]    Readmission Risk Interventions     No  data to display

## 2023-11-20 NOTE — Progress Notes (Signed)
 Mobility Specialist - Progress Note     11/20/23 1158  Mobility  Activity Ambulated independently in hallway;Stood at bedside  Level of Assistance Modified independent, requires aide device or extra time  Assistive Device Front wheel walker  Distance Ambulated (ft) 800 ft  Range of Motion/Exercises Active  Activity Response Tolerated well  Mobility Referral Yes  Mobility visit 1 Mobility  Mobility Specialist Start Time (ACUTE ONLY) 1128  Mobility Specialist Stop Time (ACUTE ONLY) 1149  Mobility Specialist Time Calculation (min) (ACUTE ONLY) 21 min   Pt resting in bed on RA upon entry. Pt STS and ambulates to hallway around ED department. Pt agreeable and pleasant throughout the session. Pt easily redirected when distracted by other patients in rooms. Pt returned to bed and left with needs in reach. ED staff aware patient back in bed.   Johnathan Hausen Mobility Specialist 11/20/23, 12:08 PM

## 2023-11-20 NOTE — ED Notes (Signed)
 Mobility specialist with pt. Ambulating with walker around ED.

## 2023-11-20 NOTE — ED Notes (Signed)
Lunch tray provided for pt

## 2023-11-20 NOTE — ED Provider Notes (Signed)
 Emergency Medicine Observation Re-evaluation Note  Physical Exam   BP (!) 143/78   Pulse 68   Temp 97.8 F (36.6 C) (Oral)   Resp 18   Ht 5\' 6"  (1.676 m)   Wt 90.7 kg   SpO2 93%   BMI 32.28 kg/m   Patient appears in no acute distress.  ED Course / MDM   No reported events during my shift at the time of this note.   Pt is awaiting dispo from consultants   Pilar Jarvis MD    Pilar Jarvis, MD 11/20/23 [phone]

## 2023-11-20 NOTE — ED Notes (Signed)
 Pt returned to room with mobility specialist

## 2023-11-21 ENCOUNTER — Emergency Department: Payer: 59

## 2023-11-21 DIAGNOSIS — Z79899 Other long term (current) drug therapy: Secondary | ICD-10-CM | POA: Diagnosis not present

## 2023-11-21 DIAGNOSIS — R911 Solitary pulmonary nodule: Secondary | ICD-10-CM | POA: Diagnosis present

## 2023-11-21 DIAGNOSIS — Z5919 Other inadequate housing: Secondary | ICD-10-CM | POA: Diagnosis not present

## 2023-11-21 DIAGNOSIS — F03918 Unspecified dementia, unspecified severity, with other behavioral disturbance: Secondary | ICD-10-CM | POA: Diagnosis present

## 2023-11-21 DIAGNOSIS — J849 Interstitial pulmonary disease, unspecified: Secondary | ICD-10-CM | POA: Diagnosis present

## 2023-11-21 DIAGNOSIS — J449 Chronic obstructive pulmonary disease, unspecified: Secondary | ICD-10-CM

## 2023-11-21 DIAGNOSIS — G4700 Insomnia, unspecified: Secondary | ICD-10-CM | POA: Diagnosis present

## 2023-11-21 DIAGNOSIS — N1831 Chronic kidney disease, stage 3a: Secondary | ICD-10-CM

## 2023-11-21 DIAGNOSIS — K219 Gastro-esophageal reflux disease without esophagitis: Secondary | ICD-10-CM | POA: Diagnosis present

## 2023-11-21 DIAGNOSIS — G4730 Sleep apnea, unspecified: Secondary | ICD-10-CM

## 2023-11-21 DIAGNOSIS — I480 Paroxysmal atrial fibrillation: Secondary | ICD-10-CM

## 2023-11-21 DIAGNOSIS — J189 Pneumonia, unspecified organism: Secondary | ICD-10-CM | POA: Diagnosis present

## 2023-11-21 DIAGNOSIS — I129 Hypertensive chronic kidney disease with stage 1 through stage 4 chronic kidney disease, or unspecified chronic kidney disease: Secondary | ICD-10-CM | POA: Diagnosis present

## 2023-11-21 DIAGNOSIS — I951 Orthostatic hypotension: Secondary | ICD-10-CM | POA: Diagnosis present

## 2023-11-21 DIAGNOSIS — J9601 Acute respiratory failure with hypoxia: Secondary | ICD-10-CM | POA: Diagnosis present

## 2023-11-21 DIAGNOSIS — E1122 Type 2 diabetes mellitus with diabetic chronic kidney disease: Secondary | ICD-10-CM | POA: Diagnosis present

## 2023-11-21 DIAGNOSIS — Z87891 Personal history of nicotine dependence: Secondary | ICD-10-CM | POA: Diagnosis not present

## 2023-11-21 DIAGNOSIS — Z90710 Acquired absence of both cervix and uterus: Secondary | ICD-10-CM | POA: Diagnosis not present

## 2023-11-21 DIAGNOSIS — Z7901 Long term (current) use of anticoagulants: Secondary | ICD-10-CM | POA: Diagnosis not present

## 2023-11-21 DIAGNOSIS — Z853 Personal history of malignant neoplasm of breast: Secondary | ICD-10-CM | POA: Diagnosis not present

## 2023-11-21 DIAGNOSIS — Z880 Allergy status to penicillin: Secondary | ICD-10-CM | POA: Diagnosis not present

## 2023-11-21 DIAGNOSIS — E785 Hyperlipidemia, unspecified: Secondary | ICD-10-CM | POA: Diagnosis present

## 2023-11-21 DIAGNOSIS — I10 Essential (primary) hypertension: Secondary | ICD-10-CM | POA: Diagnosis not present

## 2023-11-21 DIAGNOSIS — R451 Restlessness and agitation: Secondary | ICD-10-CM | POA: Diagnosis present

## 2023-11-21 DIAGNOSIS — Z885 Allergy status to narcotic agent status: Secondary | ICD-10-CM | POA: Diagnosis not present

## 2023-11-21 DIAGNOSIS — W182XXA Fall in (into) shower or empty bathtub, initial encounter: Secondary | ICD-10-CM | POA: Diagnosis present

## 2023-11-21 DIAGNOSIS — F03911 Unspecified dementia, unspecified severity, with agitation: Secondary | ICD-10-CM | POA: Diagnosis present

## 2023-11-21 DIAGNOSIS — Z7951 Long term (current) use of inhaled steroids: Secondary | ICD-10-CM | POA: Diagnosis not present

## 2023-11-21 DIAGNOSIS — Z1152 Encounter for screening for COVID-19: Secondary | ICD-10-CM | POA: Diagnosis not present

## 2023-11-21 DIAGNOSIS — J440 Chronic obstructive pulmonary disease with acute lower respiratory infection: Secondary | ICD-10-CM | POA: Diagnosis present

## 2023-11-21 DIAGNOSIS — Y92238 Other place in hospital as the place of occurrence of the external cause: Secondary | ICD-10-CM | POA: Diagnosis present

## 2023-11-21 LAB — URINALYSIS, ROUTINE W REFLEX MICROSCOPIC
Bilirubin Urine: NEGATIVE
Glucose, UA: NEGATIVE mg/dL
Ketones, ur: NEGATIVE mg/dL
Leukocytes,Ua: NEGATIVE
Nitrite: NEGATIVE
Protein, ur: NEGATIVE mg/dL
Specific Gravity, Urine: 1.012 (ref 1.005–1.030)
pH: 8 (ref 5.0–8.0)

## 2023-11-21 LAB — BASIC METABOLIC PANEL
Anion gap: 11 (ref 5–15)
BUN: 16 mg/dL (ref 8–23)
CO2: 27 mmol/L (ref 22–32)
Calcium: 9.3 mg/dL (ref 8.9–10.3)
Chloride: 106 mmol/L (ref 98–111)
Creatinine, Ser: 1.19 mg/dL — ABNORMAL HIGH (ref 0.44–1.00)
GFR, Estimated: 46 mL/min — ABNORMAL LOW (ref >60)
Glucose, Bld: 114 mg/dL — ABNORMAL HIGH (ref 70–99)
Potassium: 3.7 mmol/L (ref 3.5–5.1)
Sodium: 144 mmol/L (ref 135–145)

## 2023-11-21 LAB — RESP PANEL BY RT-PCR (RSV, FLU A&B, COVID)  RVPGX2
Influenza A by PCR: NEGATIVE
Influenza B by PCR: NEGATIVE
Resp Syncytial Virus by PCR: NEGATIVE
SARS Coronavirus 2 by RT PCR: NEGATIVE

## 2023-11-21 LAB — CBC WITH DIFFERENTIAL/PLATELET
Abs Immature Granulocytes: 0.03 10*3/uL (ref 0.00–0.07)
Basophils Absolute: 0 10*3/uL (ref 0.0–0.1)
Basophils Relative: 0 %
Eosinophils Absolute: 0.1 10*3/uL (ref 0.0–0.5)
Eosinophils Relative: 1 %
HCT: 41.6 % (ref 36.0–46.0)
Hemoglobin: 13.8 g/dL (ref 12.0–15.0)
Immature Granulocytes: 0 %
Lymphocytes Relative: 6 %
Lymphs Abs: 0.5 10*3/uL — ABNORMAL LOW (ref 0.7–4.0)
MCH: 30.3 pg (ref 26.0–34.0)
MCHC: 33.2 g/dL (ref 30.0–36.0)
MCV: 91.2 fL (ref 80.0–100.0)
Monocytes Absolute: 0.4 10*3/uL (ref 0.1–1.0)
Monocytes Relative: 5 %
Neutro Abs: 8.2 10*3/uL — ABNORMAL HIGH (ref 1.7–7.7)
Neutrophils Relative %: 88 %
Platelets: 149 10*3/uL — ABNORMAL LOW (ref 150–400)
RBC: 4.56 MIL/uL (ref 3.87–5.11)
RDW: 16 % — ABNORMAL HIGH (ref 11.5–15.5)
WBC: 9.3 10*3/uL (ref 4.0–10.5)
nRBC: 0 % (ref 0.0–0.2)

## 2023-11-21 LAB — LACTIC ACID, PLASMA
Lactic Acid, Venous: 1 mmol/L (ref 0.5–1.9)
Lactic Acid, Venous: 1.5 mmol/L (ref 0.5–1.9)

## 2023-11-21 LAB — TROPONIN I (HIGH SENSITIVITY)
Troponin I (High Sensitivity): 12 ng/L (ref <18)
Troponin I (High Sensitivity): 14 ng/L (ref <18)

## 2023-11-21 LAB — CBG MONITORING, ED: Glucose-Capillary: 119 mg/dL — ABNORMAL HIGH (ref 70–99)

## 2023-11-21 MED ORDER — IPRATROPIUM-ALBUTEROL 0.5-2.5 (3) MG/3ML IN SOLN
3.0000 mL | Freq: Three times a day (TID) | RESPIRATORY_TRACT | Status: DC
  Administered 2023-11-22: 3 mL via RESPIRATORY_TRACT
  Filled 2023-11-21: qty 3

## 2023-11-21 MED ORDER — HYDRALAZINE HCL 20 MG/ML IJ SOLN: 5.0000 mg | INTRAMUSCULAR | Status: DC | PRN

## 2023-11-21 MED ORDER — SODIUM CHLORIDE 0.9 % IV SOLN
500.0000 mg | INTRAVENOUS | Status: DC
  Administered 2023-11-22 – 2023-11-24 (×3): 500 mg via INTRAVENOUS
  Filled 2023-11-21 (×4): qty 5

## 2023-11-21 MED ORDER — SODIUM CHLORIDE 0.9 % IV SOLN
2.0000 g | Freq: Once | INTRAVENOUS | Status: AC
  Administered 2023-11-21: 2 g via INTRAVENOUS
  Filled 2023-11-21: qty 20

## 2023-11-21 MED ORDER — IOHEXOL 350 MG/ML SOLN
75.0000 mL | Freq: Once | INTRAVENOUS | Status: AC | PRN
  Administered 2023-11-21: 75 mL via INTRAVENOUS

## 2023-11-21 MED ORDER — TRAMADOL HCL 50 MG PO TABS: 50.0000 mg | ORAL_TABLET | Freq: Two times a day (BID) | ORAL | Status: DC | PRN

## 2023-11-21 MED ORDER — FOLIC ACID 1 MG PO TABS
1.0000 mg | ORAL_TABLET | Freq: Every day | ORAL | Status: DC
  Administered 2023-11-22 – 2023-12-31 (×40): 1 mg via ORAL
  Filled 2023-11-21 (×41): qty 1

## 2023-11-21 MED ORDER — DM-GUAIFENESIN ER 30-600 MG PO TB12: 1.0000 | ORAL_TABLET | Freq: Two times a day (BID) | ORAL | Status: DC | PRN

## 2023-11-21 MED ORDER — IPRATROPIUM-ALBUTEROL 0.5-2.5 (3) MG/3ML IN SOLN
3.0000 mL | RESPIRATORY_TRACT | Status: DC
  Administered 2023-11-21: 3 mL via RESPIRATORY_TRACT
  Filled 2023-11-21: qty 3

## 2023-11-21 MED ORDER — SODIUM CHLORIDE 0.9 % IV SOLN
2.0000 g | INTRAVENOUS | Status: DC
  Administered 2023-11-22 – 2023-11-25 (×4): 2 g via INTRAVENOUS
  Filled 2023-11-21 (×4): qty 20

## 2023-11-21 MED ORDER — MOMETASONE FURO-FORMOTEROL FUM 200-5 MCG/ACT IN AERO
2.0000 | INHALATION_SPRAY | Freq: Two times a day (BID) | RESPIRATORY_TRACT | Status: DC
  Administered 2023-11-23 – 2023-12-31 (×75): 2 via RESPIRATORY_TRACT
  Filled 2023-11-21 (×4): qty 8.8

## 2023-11-21 MED ORDER — MECLIZINE HCL 25 MG PO TABS: 12.5000 mg | ORAL_TABLET | Freq: Three times a day (TID) | ORAL | Status: DC | PRN

## 2023-11-21 MED ORDER — SODIUM CHLORIDE 0.9 % IV SOLN: 500.0000 mg | Freq: Once | INTRAVENOUS | Status: DC

## 2023-11-21 MED ORDER — SODIUM CHLORIDE 0.9 % IV BOLUS
1000.0000 mL | Freq: Once | INTRAVENOUS | Status: AC
  Administered 2023-11-21: 1000 mL via INTRAVENOUS

## 2023-11-21 MED ORDER — ACETAMINOPHEN 325 MG PO TABS
650.0000 mg | ORAL_TABLET | Freq: Four times a day (QID) | ORAL | Status: DC | PRN
  Administered 2023-11-22 – 2023-12-10 (×3): 650 mg via ORAL
  Filled 2023-11-21 (×4): qty 2

## 2023-11-21 MED ORDER — ONDANSETRON HCL 4 MG/2ML IJ SOLN: 4.0000 mg | Freq: Three times a day (TID) | INTRAMUSCULAR | Status: DC | PRN

## 2023-11-21 MED ORDER — METHYLPREDNISOLONE SODIUM SUCC 125 MG IJ SOLR
80.0000 mg | Freq: Every day | INTRAMUSCULAR | Status: DC
  Administered 2023-11-21 – 2023-11-23 (×3): 80 mg via INTRAVENOUS
  Filled 2023-11-21 (×3): qty 2

## 2023-11-21 MED ORDER — VITAMIN B-12 1000 MCG PO TABS
1000.0000 ug | ORAL_TABLET | Freq: Every day | ORAL | Status: DC
  Administered 2023-11-22 – 2023-12-31 (×40): 1000 ug via ORAL
  Filled 2023-11-21 (×40): qty 1

## 2023-11-21 NOTE — ED Notes (Signed)
 This tech and nursing student Dahlia Client cleaned pt up after urinary incontinence. New brief was put on pt. Pt now resting comfortably in bed.

## 2023-11-21 NOTE — ED Notes (Signed)
 CCMD notified pt on monitor

## 2023-11-21 NOTE — ED Provider Notes (Signed)
-----------------------------------------   5:54 PM on 11/21/2023 -----------------------------------------   Blood pressure (!) 147/60, pulse (!) 103, temperature 98.2 F (36.8 C), temperature source Oral, resp. rate (!) 22, height 5\' 6"  (1.676 m), weight 90.7 kg, SpO2 95%.  Patient has been in the ED for extended period of time due to agitation and placement issues.  She does have a history of COPD but today she has had worsening weakness with difficulty getting up on her own, new tachycardia, as well as new hypoxia.  She dropped to 86% and has had a productive cough today.  Exam shows rhonchi throughout.  Heart rate 120 at this time with slightly elevated respiratory rate in the 30s.  Swab was negative for COVID, flu, and RSV.  Chest x-ray without obvious findings.  CTA was ordered which shows no evidence of PE but concern for UIP versus possible fibrosis or pneumonia.  Given her tachycardia and tachypnea with new hypoxia, will start on broad-spectrum antibiotics as well as give fluids to treat her pneumonia.  Will consult hospitalist for admission.  .Critical Care  Performed by: Janith Lima, MD Authorized by: Janith Lima, MD   Critical care provider statement:    Critical care time (minutes):  30   Critical care was necessary to treat or prevent imminent or life-threatening deterioration of the following conditions:  Respiratory failure and sepsis   Critical care was time spent personally by me on the following activities:  Development of treatment plan with patient or surrogate, discussions with consultants, evaluation of patient's response to treatment, examination of patient, ordering and review of laboratory studies, ordering and review of radiographic studies, ordering and performing treatments and interventions, pulse oximetry, re-evaluation of patient's condition and review of old charts   I assumed direction of critical care for this patient from another provider in my specialty: no      Care discussed with: admitting provider       Janith Lima, MD 11/21/23 1824

## 2023-11-21 NOTE — ED Notes (Signed)
 Pt placed on stretcher, taken to CT

## 2023-11-21 NOTE — ED Notes (Addendum)
 Mobility at bedside. Pt unable to sit up on her own on side of bed. Baseline able to ambulate independently. Pt c/o stomach pain, reports normal BM this AM. CBG checked and Dr. Fuller Plan notified.

## 2023-11-21 NOTE — TOC Progression Note (Addendum)
 Transition of Care [facility]) - Progression Note    Patient Details  Name: Tracy Dickerson MRN: [id number] Date of Birth: June 18, 1941  Transition of Care [facility]) CM/SW Contact  Margarito Liner, LCSW Phone Number: 11/21/2023, 11:59 AM  Clinical Narrative:  [facility] has not reviewed the referral yet but will do so today. Left voicemail for the director at [facility] to see if they have any memory care beds. Faxed referral to [facility] (they will review Monday) and [facility].   4:05 pm: Virtual assessment with [facility] scheduled for 10:00 on Monday. Nia with DSS is aware.  Expected Discharge Plan: Long Term Nursing Home Barriers to Discharge: Continued Medical Work up  Expected Discharge Plan and Services In-house Referral: Clinical Social Work Discharge Planning Services: NA Post Acute Care Choice: Skilled Nursing Facility Living arrangements for the past 2 months: Single Family Home (Unsafe to return)                   DME Agency: NA     Representative spoke with at DME Agency: N/A HH Arranged: NA           Social Determinants of Health (SDOH) Interventions SDOH Screenings   Food Insecurity: No Food Insecurity (08/21/2023)  Housing: Medium Risk (08/21/2023)  Transportation Needs: No Transportation Needs (08/21/2023)  Utilities: Not At Risk (08/21/2023)  Financial Resource Strain: Low Risk  (02/04/2023)   Received from [facility], [facility]  Physical Activity: Inactive (07/16/2021)   Received from [facility], [facility]  Stress: No Stress Concern Present (07/16/2021)   Received from [facility], [facility]  Tobacco Use: Medium Risk (09/04/2023)  Health Literacy: Medium Risk (07/16/2021)   Received from [facility], [facility]    Readmission Risk Interventions     No data to display

## 2023-11-21 NOTE — ED Provider Notes (Signed)
 Patient with increased weakness.  Evaluated patient and she is able to move all her extremities well no evidence of cranial nerve deficit.  She does seems globally weak.  Was noted to have some borderline oxygen level so placed on 2 L.  Sinus tachycardia rate  without any ST elevation or T wave inversions, normal intervals.  Troponin was negative.  CBC was reassuring BMP is reassuring.  Patient handed off to oncoming team.  Patient had to be placed on 2 L of oxygen.   Concha Se, MD 11/21/23 [phone]

## 2023-11-21 NOTE — H&P (Signed)
 History and Physical    Tracy Dickerson ZOX:[id number] DOB: [birth year] DOA: 09/04/2023  Referring MD/NP/PA:   PCP: Dwana Melena, PA   Patient coming from:  The patient is coming from home.     Chief Complaint: SOB   HPI: Tracy Dickerson is a 83 y.o. female with medical history significant of hypertension, hyperlipidemia, diabetes mellitus, GERD, breast cancer, CKD-3a, septic shock due to UTI, PAF on Eliquis, orthostatic hypotension on Cortef, ho presents with SOB  Patient has been in the ED for extended period of time since 09/03/24 due to agitation. Patient was evaluated by psychiatry, waiting for placement.  Per ED physician, this morning, patient to be, weaker, developed cough, shortness of breath mild chest pain, found to have oxygen desaturation to 87% on room air, which improved to 95% on 2 L oxygen.  This is not new oxygen requirement.  Patient does not have fever or chills.  No nausea, vomiting, diarrhea or abdominal pain.  No symptoms of UTI.  Data reviewed independently and ED Course: pt was found to have WBC 9.3, stable renal function, troponin 12 --> 14, negative PCR for COVID, flu and RSV. Temperature 99.7, softer blood pressure 92/79, heart rate 104, RR 22.  CTA is negative for PE, but showed possible atypical pneumonia and interstitial lung disease. Patient is admitted to telemetry bed as inpatient.   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   CTA: 1.  No evidence of pulmonary embolism. 2. Similar appearance of interstitial lung disease with differential considerations of postinfectious/inflammatory fibrosis or a somewhat atypical distribution of usual interstitial pneumonia. Consider high-resolution chest CT follow-up at 6-12 months. 3. 1.1 cm posterior right upper lobe ground-glass nodule is new since 08/20/2023. Per consensus criteria, this warrants follow-up with chest CT at 6-12 months. This recommendation  follows the consensus statement: Guidelines for Management of Incidental Pulmonary Nodules Detected on CT Images:From the Fleischner Society 2017; published online before print (10.1148/[id number]). 4. Thoracic adenopathy is similar and favored to be reactive. Recommend attention on follow-up. 5. Coronary artery atherosclerosis. Aortic Atherosclerosis (ICD10-I70.0).    EKG: I have personally reviewed.  Sinus rhythm, QTc 423, PVC, LAD, poor R wave progression  Review of Systems:   General: no fevers, chills, no body weight gain, has fatigue HEENT: no blurry vision, hearing changes or sore throat Respiratory: has dyspnea, coughing, no wheezing CV: no chest pain, no palpitations GI: no nausea, vomiting, abdominal pain, diarrhea, constipation GU: no dysuria, burning on urination, increased urinary frequency, hematuria  Ext: has trace leg edema Neuro: no unilateral weakness, numbness, or tingling, no vision change or hearing loss Skin: no rash, no skin tear. MSK: No muscle spasm, no deformity, no limitation of range of movement in spin Heme: No easy bruising.  Travel history: No recent long distant travel.   Allergy:  Allergies  Allergen Reactions   Oxycodone    Percocet [Oxycodone-Acetaminophen] Itching   Penicillins Rash    Has patient had a PCN reaction causing immediate rash, facial/tongue/throat swelling, SOB or lightheadedness with hypotension: Yes Has patient had a PCN reaction causing severe rash involving mucus membranes or skin necrosis: Unknown Has patient had a PCN reaction that required hospitalization: Unknown Has patient had a PCN reaction occurring within the last 10 years: No If all of the above answers are "NO", then may proceed with Cephalosporin use.    Past Medical History:  Diagnosis Date   Cancer (HCC)    Breast and lung   Diabetes  mellitus without complication (HCC)    GERD (gastroesophageal reflux disease)    Heart disease    Hypertension      Past Surgical History:  Procedure Laterality Date   ABDOMINAL HYSTERECTOMY     APPENDECTOMY     BREAST SURGERY     breast cancer LEFT   COLONOSCOPY WITH PROPOFOL N/A 08/23/2023   Procedure: COLONOSCOPY WITH PROPOFOL;  Surgeon: Toney Reil, MD;  Location: [facility] ENDOSCOPY;  Service: Gastroenterology;  Laterality: N/A;   ESOPHAGOGASTRODUODENOSCOPY N/A 08/23/2023   Procedure: ESOPHAGOGASTRODUODENOSCOPY (EGD);  Surgeon: Toney Reil, MD;  Location: [facility] ENDOSCOPY;  Service: Gastroenterology;  Laterality: N/A;   ESOPHAGOGASTRODUODENOSCOPY (EGD) WITH PROPOFOL N/A 01/07/2023   Procedure: ESOPHAGOGASTRODUODENOSCOPY (EGD) WITH PROPOFOL;  Surgeon: Midge Minium, MD;  Location: [facility] ENDOSCOPY;  Service: Endoscopy;  Laterality: N/A;   GIVENS CAPSULE STUDY N/A 08/23/2023   Procedure: GIVENS CAPSULE STUDY;  Surgeon: Toney Reil, MD;  Location: [facility] ENDOSCOPY;  Service: Gastroenterology;  Laterality: N/A;   OPEN REDUCTION INTERNAL FIXATION (ORIF) TIBIA/FIBULA FRACTURE Left 02/03/2018   Procedure: OPEN REDUCTION INTERNAL FIXATION (ORIF) DISTAL TIBIA FRACTURE;  Surgeon: Christena Flake, MD;  Location: [facility] ORS;  Service: Orthopedics;  Laterality: Left;  ankle/lower leg   TONSILLECTOMY      Social History:  reports that she has quit smoking. She has never used smokeless tobacco. She reports that she does not currently use alcohol. She reports that she does not use drugs.  Family History:  Family History  Problem Relation Age of Onset   Cancer Mother      Prior to Admission medications   Medication Sig Start Date End Date Taking? Authorizing Provider  acetaminophen (TYLENOL) 500 MG tablet Take 500 mg by mouth every 6 (six) hours as needed.   Yes [provider]  albuterol (PROAIR HFA) 108 (90 Base) MCG/ACT inhaler Inhale 2 puffs into the lungs every 4 (four) hours as needed for wheezing or shortness of breath.    Yes [provider]  apixaban (ELIQUIS) 5 MG TABS  tablet Take 1 tablet (5 mg total) by mouth 2 (two) times daily. 06/20/23  Yes Wouk, Wilfred Curtis, MD  atorvastatin (LIPITOR) 20 MG tablet Take 20 mg by mouth daily. 05/01/20  Yes [provider]  bisacodyl (DULCOLAX) 10 MG suppository Place 1 suppository (10 mg total) rectally daily as needed for severe constipation. 09/03/23  Yes Arnetha Courser, MD  cetirizine (ZYRTEC) 5 MG tablet Take 1 tablet (5 mg total) by mouth daily. 03/25/23 03/24/24 Yes Pilar Jarvis, MD  cyanocobalamin (VITAMIN B12) 1000 MCG tablet Take 1 tablet (1,000 mcg total) by mouth daily. 06/20/23  Yes Wouk, Wilfred Curtis, MD  fluticasone-salmeterol (ADVAIR) 250-50 MCG/ACT AEPB Inhale 1 puff into the lungs in the morning and at bedtime.   Yes [provider]  folic acid (FOLVITE) 1 MG tablet Take 1 tablet (1 mg total) by mouth daily. 09/04/23  Yes Arnetha Courser, MD  gabapentin (NEURONTIN) 300 MG capsule Take 2 capsules (600 mg total) by mouth at bedtime. 09/03/23  Yes Arnetha Courser, MD  hydrocortisone (CORTEF) 5 MG tablet Take 1 tablet (5 mg total) by mouth daily. 09/03/23  Yes Arnetha Courser, MD  hydrOXYzine (ATARAX) 10 MG tablet Take 1 tablet (10 mg total) by mouth 3 (three) times daily as needed for itching. 09/03/23  Yes Arnetha Courser, MD  meclizine (ANTIVERT) 12.5 MG tablet Take 1 tablet (12.5 mg total) by mouth 3 (three) times daily as needed for dizziness. 07/03/23  Yes Donna Bernard  K, MD  midodrine (PROAMATINE) 5 MG tablet Take 1 tablet (5 mg total) by mouth 3 (three) times daily with meals. 09/03/23  Yes Arnetha Courser, MD  ondansetron (ZOFRAN ODT) 4 MG disintegrating tablet Take 1 tablet (4 mg total) by mouth every 8 (eight) hours as needed. 06/25/21  Yes Menshew, Charlesetta Ivory, PA-C  pantoprazole (PROTONIX) 40 MG tablet Take 1 tablet (40 mg total) by mouth 2 (two) times daily. 06/20/23 06/19/24 Yes Wouk, Wilfred Curtis, MD  SPIRIVA HANDIHALER 18 MCG inhalation capsule Place 1 capsule (18 mcg total) into inhaler and inhale  daily. 02/09/22  Yes Pia Mau M, PA-C  traMADol (ULTRAM) 50 MG tablet Take 1 tablet (50 mg total) by mouth 2 (two) times daily as needed. 09/03/23  Yes Arnetha Courser, MD  triamcinolone ointment (KENALOG) 0.1 % Apply 1 Application topically 2 (two) times daily.   Yes [provider]    Physical Exam: Vitals:   11/21/23 1739 11/21/23 1826 11/21/23 1900 11/21/23 2200  BP:  (!) 140/72 (!) 151/84 (!) 158/77  Pulse: (!) 103 (!) 106  97  Resp: (!) 22 (!) 22 (!) 21 18  Temp:  98.4 F (36.9 C)  99.1 F (37.3 C)  TempSrc:  Oral  Oral  SpO2: 95% 97% 96% 95%  Weight:      Height:       General: Not in acute distress HEENT:       Eyes: PERRL, EOMI, no jaundice       ENT: No discharge from the ears and nose, no pharynx injection, no tonsillar enlargement.        Neck: No JVD, no bruit, no mass felt. Heme: No neck lymph node enlargement. Cardiac: S1/S2, RRR, No murmurs, No gallops or rubs. Respiratory: No rales, wheezing, rhonchi or rubs.  Has scattered rhonchi bilaterally GI: Soft, nondistended, nontender, no rebound pain, no organomegaly, BS present. GU: No hematuria Ext: has trace leg edema bilaterally. 1+DP/PT pulse bilaterally. Musculoskeletal: No joint deformities, No joint redness or warmth, no limitation of ROM in spin. Skin: No rashes.  Neuro: Alert, oriented X3, cranial nerves II-XII grossly intact, moves all extremities. Psych: Patient is not psychotic, no suicidal or hemocidal ideation.  Labs on Admission: I have personally reviewed following labs and imaging studies  CBC: Recent Labs  Lab 11/18/23 1818 11/21/23 1524  WBC 6.7 9.3  NEUTROABS  --  8.2*  HGB 13.0 13.8  HCT 37.1 41.6  MCV 93.5 91.2  PLT 176 149*   Basic Metabolic Panel: Recent Labs  Lab 11/21/23 1524  NA 144  K 3.7  CL 106  CO2 27  GLUCOSE 114*  BUN 16  CREATININE 1.19*  CALCIUM 9.3   GFR: Estimated Creatinine Clearance: 42.8 mL/min (A) (by C-G formula based on SCr of 1.19 mg/dL  (H)). Liver Function Tests: No results for input(s): "AST", "ALT", "ALKPHOS", "BILITOT", "PROT", "ALBUMIN" in the last 168 hours. No results for input(s): "LIPASE", "AMYLASE" in the last 168 hours. No results for input(s): "AMMONIA" in the last 168 hours. Coagulation Profile: No results for input(s): "INR", "PROTIME" in the last 168 hours. Cardiac Enzymes: No results for input(s): "CKTOTAL", "CKMB", "CKMBINDEX", "TROPONINI" in the last 168 hours. BNP (last 3 results) No results for input(s): "PROBNP" in the last 8760 hours. HbA1C: No results for input(s): "HGBA1C" in the last 72 hours. CBG: Recent Labs  Lab 11/21/23 1435  GLUCAP 119*   Lipid Profile: No results for input(s): "CHOL", "HDL", "LDLCALC", "TRIG", "CHOLHDL", "LDLDIRECT" in the  last 72 hours. Thyroid Function Tests: No results for input(s): "TSH", "T4TOTAL", "FREET4", "T3FREE", "THYROIDAB" in the last 72 hours. Anemia Panel: No results for input(s): "VITAMINB12", "FOLATE", "FERRITIN", "TIBC", "IRON", "RETICCTPCT" in the last 72 hours. Urine analysis:    Component Value Date/Time   COLORURINE YELLOW (A) 11/21/2023 1621   APPEARANCEUR CLOUDY (A) 11/21/2023 1621   APPEARANCEUR Hazy 01/07/2015 1812   LABSPEC 1.012 11/21/2023 1621   LABSPEC 1.014 01/07/2015 1812   PHURINE 8.0 11/21/2023 1621   GLUCOSEU NEGATIVE 11/21/2023 1621   GLUCOSEU Negative 01/07/2015 1812   HGBUR SMALL (A) 11/21/2023 1621   BILIRUBINUR NEGATIVE 11/21/2023 1621   BILIRUBINUR Negative 01/07/2015 1812   KETONESUR NEGATIVE 11/21/2023 1621   PROTEINUR NEGATIVE 11/21/2023 1621   NITRITE NEGATIVE 11/21/2023 1621   LEUKOCYTESUR NEGATIVE 11/21/2023 1621   LEUKOCYTESUR Negative 01/07/2015 1812   Sepsis Labs: @LABRCNTIP (procalcitonin:4,lacticidven:4) ) Recent Results (from the past 240 hours)  Resp panel by RT-PCR (RSV, Flu A&B, Covid) Anterior Nasal Swab     Status: None   Collection Time: 11/21/23  2:36 PM   Specimen: Anterior Nasal Swab  Result  Value Ref Range Status   SARS Coronavirus 2 by RT PCR NEGATIVE NEGATIVE Final    Comment: (NOTE) SARS-CoV-2 target nucleic acids are NOT DETECTED.  The SARS-CoV-2 RNA is generally detectable in upper respiratory specimens during the acute phase of infection. The lowest concentration of SARS-CoV-2 viral copies this assay can detect is 138 copies/mL. A negative result does not preclude SARS-Cov-2 infection and should not be used as the sole basis for treatment or other patient management decisions. A negative result may occur with  improper specimen collection/handling, submission of specimen other than nasopharyngeal swab, presence of viral mutation(s) within the areas targeted by this assay, and inadequate number of viral copies(<138 copies/mL). A negative result must be combined with clinical observations, patient history, and epidemiological information. The expected result is Negative.  Fact Sheet for Patients:  BloggerCourse.com  Fact Sheet for Healthcare Providers:  SeriousBroker.it  This test is no t yet approved or cleared by the [place] FDA and  has been authorized for detection and/or diagnosis of SARS-CoV-2 by FDA under an Emergency Use Authorization (EUA). This EUA will remain  in effect (meaning this test can be used) for the duration of the COVID-19 declaration under Section 564(b)(1) of the Act, 21 U.S.C.section 360bbb-3(b)(1), unless the authorization is terminated  or revoked sooner.       Influenza A by PCR NEGATIVE NEGATIVE Final   Influenza B by PCR NEGATIVE NEGATIVE Final    Comment: (NOTE) The Xpert Xpress SARS-CoV-2/FLU/RSV plus assay is intended as an aid in the diagnosis of influenza from Nasopharyngeal swab specimens and should not be used as a sole basis for treatment. Nasal washings and aspirates are unacceptable for Xpert Xpress SARS-CoV-2/FLU/RSV testing.  Fact Sheet for  Patients: BloggerCourse.com  Fact Sheet for Healthcare Providers: SeriousBroker.it  This test is not yet approved or cleared by the [place] FDA and has been authorized for detection and/or diagnosis of SARS-CoV-2 by FDA under an Emergency Use Authorization (EUA). This EUA will remain in effect (meaning this test can be used) for the duration of the COVID-19 declaration under Section 564(b)(1) of the Act, 21 U.S.C. section 360bbb-3(b)(1), unless the authorization is terminated or revoked.     Resp Syncytial Virus by PCR NEGATIVE NEGATIVE Final    Comment: (NOTE) Fact Sheet for Patients: BloggerCourse.com  Fact Sheet for Healthcare Providers: SeriousBroker.it  This test is not  yet approved or cleared by the [place] and has been authorized for detection and/or diagnosis of SARS-CoV-2 by FDA under an Emergency Use Authorization (EUA). This EUA will remain in effect (meaning this test can be used) for the duration of the COVID-19 declaration under Section 564(b)(1) of the Act, 21 U.S.C. section 360bbb-3(b)(1), unless the authorization is terminated or revoked.  Performed at [facility], [address], [place], [place] [address]      Radiological Exams on Admission:   Assessment/Plan Principal Problem:   Atypical pneumonia Active Problems:   ILD (interstitial lung disease) (HCC)   COPD (chronic obstructive pulmonary disease) (HCC)   HTN (hypertension)   HLD (hyperlipidemia)   PAF (paroxysmal atrial fibrillation) (HCC)   CKD stage 3a, GFR 45-59 ml/min (HCC)   Orthostatic hypotension   Lung nodule   Dementia with behavioral disturbance (HCC)   Assessment and Plan:   Atypical pneumonia, ILD (interstitial lung disease) and COPD (chronic obstructive pulmonary disease) (HCC): Patient has 2 L new oxygen requirement now.  No leukocytosis or fever.   Clinically not septic.  - Will admit to tele bed as inpt - IV Rocephin and azithromycin - Incentive spirometry - Solu-Medrol 80 daily - Mucinex for cough  - Bronchodilators - Urine legionella and S. pneumococcal antigen - Follow up blood culture x2, sputum culture - IVF: 1L of NS bolus in ED  HTN (hypertension): Blood pressure is soft.  Patient is not taking blood pressure medications currently -IV hydralazine as needed  HLD (hyperlipidemia) -Lipitor  PAF (paroxysmal atrial fibrillation) (HCC): Heart rate 38 --> 100 -Eliquis -Telemetry monitoring  CKD stage 3a, GFR 45-59 ml/min [facility]): Renal function stable. -Follow-up with BMP  Orthostatic hypotension -Continue Cortef  Lung nodule: This is incidental findings by CT. -Need to follow-up with PCP as outpatient  Dementia with behavioral disturbance (HCC) -Fall precaution -Haldol prn for agitation    DVT ppx: on Eliquis  Code Status: Full code    Family Communication:     not done, no family member is at bed side.     Disposition Plan:  SNF  Consults called:  none  Admission status and Level of care: Telemetry Medical: as inpt           Dispo: The patient is from: Home              Anticipated d/c is to: SNF              Anticipated d/c date is: 2 days              Patient currently is not medically stable to d/c.    Severity of Illness:  The appropriate patient status for this patient is INPATIENT. Inpatient status is judged to be reasonable and necessary in order to provide the required intensity of service to ensure the patient's safety. The patient's presenting symptoms, physical exam findings, and initial radiographic and laboratory data in the context of their chronic comorbidities is felt to place them at high risk for further clinical deterioration. Furthermore, it is not anticipated that the patient will be medically stable for discharge from the hospital within 2 midnights of admission.   * I  certify that at the point of admission it is my clinical judgment that the patient will require inpatient hospital care spanning beyond 2 midnights from the point of admission due to high intensity of service, high risk for further deterioration and high frequency of surveillance required.*  Date of Service 11/22/2023    Lorretta Harp [facility]   If 7PM-7AM, please contact night-coverage [email or web address] 11/22/2023, 1:29 AM

## 2023-11-21 NOTE — ED Notes (Signed)
 Breakfast provided.

## 2023-11-21 NOTE — ED Provider Notes (Signed)
-----------------------------------------   6:53 AM on 11/21/2023 -----------------------------------------   Blood pressure 137/69, pulse 77, temperature 98.1 F (36.7 C), temperature source Oral, resp. rate 16, height 5\' 6"  (1.676 m), weight 90.7 kg, SpO2 92%.  The patient is calm and cooperative at this time.  There have been no acute events since the last update.  Awaiting disposition plan from case management/social work.    Shaka Cardin, Layla Maw, DO 11/21/23 [phone]

## 2023-11-21 NOTE — ED Notes (Signed)
 This tech and Tracy Dickerson assisted pt to the bedside commode. Upon standing, this tech noticed urine on the sheets. Pt urinated some in bedside commode. Pt's bed sheets were replaced and a new chux pad was put down. Peri care was performed and pt was dressed into a new gown. Pt was assisted back into the bed x2 assist. Pt was covered back up with clean blankets. Pt now resting comfortably in bed.

## 2023-11-21 NOTE — Progress Notes (Signed)
 Mobility Specialist - Progress Note     11/21/23 1446  Oxygen Therapy  SpO2 (!) 87 %  Mobility  Activity Stood at bedside;Dangled on edge of bed  Level of Assistance Moderate assist, patient does 50-74%  Assistive Device Front wheel walker  Range of Motion/Exercises Active  Activity Response Tolerated poorly  Mobility Referral Yes  Mobility visit 1 Mobility   Pt resting in bed on RA upon entry. Pt moved to EOB Mod/Max Assist and was unable to hold her self up at bedside. Pt attempted to stand with author protective foot blocking and assistance used. Pt stood briefly and swayed before returning to sitting. Pt declined dizziness or pain during session. Session ended and pt returned to bed to check vitals. RN notified. Pt put on 1L O2 while chest xray and labs are being ordered. Pt left with needs in reach.   Johnathan Hausen Mobility Specialist 11/21/23, 2:54 PM

## 2023-11-21 NOTE — ED Notes (Signed)
 Pt lying in bed, states she is sick, c/o feeling weak, has had minimal mobility, pt noticeably weak, pt on cardiac monitor with oxygen, pt denies any pain at this time. Lab work drawn and sent.

## 2023-11-21 NOTE — ED Notes (Signed)
 Meal provided

## 2023-11-22 DIAGNOSIS — J189 Pneumonia, unspecified organism: Secondary | ICD-10-CM

## 2023-11-22 LAB — CBC
HCT: 40.8 % (ref 36.0–46.0)
Hemoglobin: 14.8 g/dL (ref 12.0–15.0)
MCH: 33.3 pg (ref 26.0–34.0)
MCHC: 36.3 g/dL — ABNORMAL HIGH (ref 30.0–36.0)
MCV: 91.7 fL (ref 80.0–100.0)
Platelets: 175 10*3/uL (ref 150–400)
RBC: 4.45 MIL/uL (ref 3.87–5.11)
RDW: 16.7 % — ABNORMAL HIGH (ref 11.5–15.5)
WBC: 12.1 10*3/uL — ABNORMAL HIGH (ref 4.0–10.5)
nRBC: 0 % (ref 0.0–0.2)

## 2023-11-22 LAB — BASIC METABOLIC PANEL
Anion gap: 11 (ref 5–15)
BUN: 20 mg/dL (ref 8–23)
CO2: 23 mmol/L (ref 22–32)
Calcium: 9.5 mg/dL (ref 8.9–10.3)
Chloride: 109 mmol/L (ref 98–111)
Creatinine, Ser: 1.3 mg/dL — ABNORMAL HIGH (ref 0.44–1.00)
GFR, Estimated: 42 mL/min — ABNORMAL LOW (ref >60)
Glucose, Bld: 183 mg/dL — ABNORMAL HIGH (ref 70–99)
Potassium: 3.5 mmol/L (ref 3.5–5.1)
Sodium: 143 mmol/L (ref 135–145)

## 2023-11-22 LAB — PROCALCITONIN: Procalcitonin: 0.2 ng/mL

## 2023-11-22 MED ORDER — IPRATROPIUM-ALBUTEROL 0.5-2.5 (3) MG/3ML IN SOLN: 3.0000 mL | RESPIRATORY_TRACT | Status: DC | PRN

## 2023-11-22 NOTE — Progress Notes (Signed)
  PROGRESS NOTE    Tracy Dickerson  [name] DOB: [birth year] DOA: 09/04/2023 PCP: Dwana Melena, PA  [id number]-AA  LOS: 1 day   Brief hospital course:   Assessment & Plan:  Tracy Dickerson is a 83 y.o. female with medical history significant of hypertension, hyperlipidemia, diabetes mellitus, GERD, breast cancer, CKD-3a, septic shock due to UTI, PAF on Eliquis, orthostatic hypotension on Cortef, ho presents with SOB   Patient has been in the ED for extended period of time since 09/03/24 due to agitation. Patient was evaluated by psychiatry, waiting for placement.  Per ED physician, this morning, patient to be, weaker, developed cough, shortness of breath mild chest pain, found to have oxygen desaturation to 87% on room air, which improved to 95% on 2 L oxygen.  This is new oxygen requirement.     Acute hypoxemic respiratory failure 2/2 Atypical pneumonia, ILD (interstitial lung disease) and COPD (chronic obstructive pulmonary disease) (HCC):  Patient has 2 L new oxygen requirement now.  No leukocytosis or fever.  Clinically not septic. --started on IV Rocephin and azithromycin and Solu-Medrol 80 daily --on room air today --cont abx and steroid for now --cont bronchodilator   HTN (hypertension):  Blood pressure is soft.  Patient is not taking blood pressure medications currently --monitor   HLD (hyperlipidemia) -Lipitor   PAF (paroxysmal atrial fibrillation) (HCC):  --cont Eliquis   CKD stage 3a, GFR 45-59 ml/min [facility]): Renal function stable.   Orthostatic hypotension -Continue Cortef   Lung nodule:  This is incidental findings by CT. -Need to follow-up with PCP as outpatient   Dementia with behavioral disturbance (HCC) -Fall precaution -Haldol prn for agitation   DVT prophylaxis: FA:OZHYQMV Code Status: Full code  Family Communication:  Level of care: Telemetry Medical Dispo:   The patient is from: home Anticipated d/c is to: to be determined Anticipated d/c  date is: whenever disposition found   Subjective and Interval History:  Pt reported dyspnea with some cough, sore throat.  No N/V.   Objective: Vitals:   11/22/23 0213 11/22/23 0754 11/22/23 0801 11/22/23 1504  BP: 114/60 96/67  (!) 117/57  Pulse: 79 77 79 86  Resp: 18 14 16 18   Temp: 98.1 F (36.7 C) 98.1 F (36.7 C)  97.7 F (36.5 C)  TempSrc: Oral Oral  Oral  SpO2: 91% 97% 93% 97%  Weight:      Height:        Intake/Output Summary (Last 24 hours) at 11/22/2023 1903 Last data filed at 11/21/2023 2158 Gross per 24 hour  Intake --  Output 650 ml  Net -650 ml   Filed Weights   09/04/23 1035  Weight: 90.7 kg    Examination:   Constitutional: NAD, alert HEENT: conjunctivae and lids normal, EOMI CV: No cyanosis.   RESP: normal respiratory effort, on RA Extremities: No effusions, edema in BLE SKIN: warm, dry Neuro: II - XII grossly intact.     Data Reviewed: I have personally reviewed labs and imaging studies  Time spent: 50 minutes  Darlin Priestly, MD [facility] If 7PM-7AM, please contact night-coverage 11/22/2023, 7:03 PM

## 2023-11-22 NOTE — Progress Notes (Signed)
 Mobility Specialist - Progress Note   11/22/23 1107  Mobility  Activity Ambulated independently to bathroom;Ambulated independently in hallway  Level of Assistance Standby assist, set-up cues, supervision of patient - no hands on  Assistive Device None;Front wheel walker  Distance Ambulated (ft) 160 ft  Activity Response Tolerated well  Mobility visit 1 Mobility  Mobility Specialist Start Time (ACUTE ONLY) 1010  Mobility Specialist Stop Time (ACUTE ONLY) 1031  Mobility Specialist Time Calculation (min) (ACUTE ONLY) 21 min   MS responding to chair alarm. Pt in the bathroom using the commode upon entry, no AD near. MS handed Pt the RW, STS from the commode MinG and amb one lap around the NS with supervision-- min cuing to remain within the RW, slightly flexed trunk. Pt returned to the room, dons pants with MinA and returned seated in the recliner. Pt left with alarm set and needs within reach. Purewick removed Per Pt request, RN notified.   Zetta Bills Mobility Specialist 11/22/23 11:16 AM

## 2023-11-22 NOTE — Plan of Care (Signed)

## 2023-11-23 DIAGNOSIS — J189 Pneumonia, unspecified organism: Secondary | ICD-10-CM | POA: Diagnosis not present

## 2023-11-23 LAB — CBC
HCT: 41 % (ref 36.0–46.0)
Hemoglobin: 14.2 g/dL (ref 12.0–15.0)
MCH: 31.7 pg (ref 26.0–34.0)
MCHC: 34.6 g/dL (ref 30.0–36.0)
MCV: 91.5 fL (ref 80.0–100.0)
Platelets: 181 10*3/uL (ref 150–400)
RBC: 4.48 MIL/uL (ref 3.87–5.11)
RDW: 16.1 % — ABNORMAL HIGH (ref 11.5–15.5)
WBC: 20.9 10*3/uL — ABNORMAL HIGH (ref 4.0–10.5)
nRBC: 0 % (ref 0.0–0.2)

## 2023-11-23 LAB — BASIC METABOLIC PANEL
Anion gap: 8 (ref 5–15)
BUN: 34 mg/dL — ABNORMAL HIGH (ref 8–23)
CO2: 26 mmol/L (ref 22–32)
Calcium: 9.2 mg/dL (ref 8.9–10.3)
Chloride: 107 mmol/L (ref 98–111)
Creatinine, Ser: 1.17 mg/dL — ABNORMAL HIGH (ref 0.44–1.00)
GFR, Estimated: 47 mL/min — ABNORMAL LOW (ref >60)
Glucose, Bld: 105 mg/dL — ABNORMAL HIGH (ref 70–99)
Potassium: 3.9 mmol/L (ref 3.5–5.1)
Sodium: 141 mmol/L (ref 135–145)

## 2023-11-23 LAB — MAGNESIUM: Magnesium: 2.5 mg/dL — ABNORMAL HIGH (ref 1.7–2.4)

## 2023-11-23 NOTE — Progress Notes (Signed)
  PROGRESS NOTE    Tracy Dickerson  [name] DOB: Aug 09, 1941 DOA: 09/04/2023 PCP: Tracy Melena, PA  214A/214A-AA  LOS: 2 days   Brief hospital course:   Assessment & Plan:  Tracy Dickerson is a 83 y.o. female with medical history significant of hypertension, hyperlipidemia, diabetes mellitus, GERD, breast cancer, CKD-3a, septic shock due to UTI, PAF on Eliquis, orthostatic hypotension on Cortef, ho presents with SOB   Patient has been in the ED for extended period of time since 09/03/24 due to agitation. Patient was evaluated by psychiatry, waiting for placement.  Per ED physician, this morning, patient to be, weaker, developed cough, shortness of breath mild chest pain, found to have oxygen desaturation to 87% on room air, which improved to 95% on 2 L oxygen.  This is new oxygen requirement.     Acute hypoxemic respiratory failure 2/2 Atypical pneumonia, ILD (interstitial lung disease) and COPD (chronic obstructive pulmonary disease) (HCC):  Patient has 2 L new oxygen requirement now.  No leukocytosis or fever.  Clinically not septic. --started on IV Rocephin and azithromycin and Solu-Medrol 80 daily --on room air by 3/1 --cont abx for now --d/c steroid --cont bronchodilator   HTN (hypertension):  Blood pressure is soft.  Patient is not taking blood pressure medications currently --monitor   HLD (hyperlipidemia) --cont statin   PAF (paroxysmal atrial fibrillation) (HCC):  --cont Eliquis   CKD stage 3a, GFR 45-59 ml/min [facility]): Renal function stable.   Orthostatic hypotension --cont Cortef   Lung nodule:  This is incidental findings by CT. -Need to follow-up with PCP as outpatient   Dementia with behavioral disturbance (HCC) -Fall precaution -Haldol prn for agitation   DVT prophylaxis: FA:[id number] Code Status: Full code  Family Communication:  Level of care: Telemetry Medical Dispo:   The patient is from: home Anticipated d/c is to: to be  determined Anticipated d/c date is: whenever disposition found   Subjective and Interval History:  Pt reported breathing improved.  Pt said she was eating ok and having BM.   Objective: Vitals:   11/22/23 2136 11/23/23 0318 11/23/23 1417 11/23/23 1425  BP: 115/73 (!) 117/51 95/66 (!) 108/57  Pulse: 79 76 73 95  Resp: 16 18 18 18   Temp: 97.6 F (36.4 C)  98 F (36.7 C) 97.9 F (36.6 C)  TempSrc: Oral     SpO2: 96% 95% 96% 96%  Weight:      Height:        Intake/Output Summary (Last 24 hours) at 11/23/2023 1746 Last data filed at 11/23/2023 1500 Gross per 24 hour  Intake 480 ml  Output --  Net 480 ml   Filed Weights   09/04/23 1035  Weight: 90.7 kg    Examination:   Constitutional: NAD, alert HEENT: conjunctivae and lids normal, EOMI CV: No cyanosis.   RESP: normal respiratory effort, on RA Neuro: II - XII grossly intact.   Psych: Normal mood and affect.     Data Reviewed: I have personally reviewed labs and imaging studies  Time spent: 35 minutes  Tracy Priestly, MD [facility] If 7PM-7AM, please contact night-coverage 11/23/2023, 5:46 PM

## 2023-11-23 NOTE — Plan of Care (Signed)

## 2023-11-24 DIAGNOSIS — J189 Pneumonia, unspecified organism: Secondary | ICD-10-CM | POA: Diagnosis not present

## 2023-11-24 NOTE — Care Management Important Message (Signed)
 Important Message  Patient Details  Name: Tracy Dickerson MRN: [id number] Date of Birth: [birth year]   Important Message Given:  Yes - Medicare IM     Cristela Blue, CMA 11/24/2023, 10:57 AM

## 2023-11-24 NOTE — Progress Notes (Signed)
 Mobility Specialist - Progress Note   11/24/23 1626  Mobility  Activity Ambulated with assistance in hallway  Level of Assistance Standby assist, set-up cues, supervision of patient - no hands on  Assistive Device Front wheel walker  Distance Ambulated (ft) 160 ft  Activity Response Tolerated well  Mobility visit 1 Mobility  Mobility Specialist Start Time (ACUTE ONLY) 1437  Mobility Specialist Stop Time (ACUTE ONLY) 1445  Mobility Specialist Time Calculation (min) (ACUTE ONLY) 8 min   Pt sitting in the recliner upon entry, utilizing RA. Pt motivated and agreeable to OOB amb this date. Pt STS to RW and amb one lap around the NS with supervision, tolerated well. Pt returned to the room, left seated in the recliner with alarm set and needs within reach.  Zetta Bills Mobility Specialist 11/24/23 4:28 PM

## 2023-11-24 NOTE — TOC Progression Note (Addendum)
 Transition of Care [facility]) - Progression Note    Patient Details  Name: Tracy Dickerson MRN: [id number] Date of Birth: 07/02/1941  Transition of Care [facility]) CM/SW Contact  Margarito Liner, LCSW Phone Number: 11/24/2023, 10:07 AM  Clinical Narrative:  Assessment completed with director at [facility]. She needs to know if patient has special assistance Medicaid and which case worker is over that. They also need to know how much she gets in social security. CSW left voicemail for [facility] DSS social worker to ask.   10:20 am: Patient does not have special assistance Medicaid yet because she was living at home prior to admission. DSS said she does qualify but they cannot start application until guardianship is obtained. Patient gets [employer] and survivors benefits. CSW sent this information to the director of [place] in a secure email.  Expected Discharge Plan: Long Term Nursing Home Barriers to Discharge: Continued Medical Work up  Expected Discharge Plan and Services In-house Referral: Clinical Social Work Discharge Planning Services: NA Post Acute Care Choice: Skilled Nursing Facility Living arrangements for the past 2 months: Single Family Home (Unsafe to return)                   DME Agency: NA     Representative spoke with at DME Agency: N/A HH Arranged: NA           Social Determinants of Health (SDOH) Interventions SDOH Screenings   Food Insecurity: No Food Insecurity (11/21/2023)  Housing: Low Risk  (11/21/2023)  Transportation Needs: No Transportation Needs (11/21/2023)  Utilities: Not At Risk (11/21/2023)  Financial Resource Strain: Low Risk  (02/04/2023)   [facility]  Physical Activity: Inactive (07/16/2021)   Received from [facility], [facility]  Social Connections: Patient Unable To Answer (11/21/2023)  Stress: No Stress Concern Present (07/16/2021)   Received from [facility], [facility]   Tobacco Use: Medium Risk (09/04/2023)  Health Literacy: Medium Risk (07/16/2021)   Received from [facility], [facility]    Readmission Risk Interventions     No data to display

## 2023-11-24 NOTE — Progress Notes (Signed)
  PROGRESS NOTE    Tracy Dickerson  WUJ:[id number] DOB: 26-Oct-1940 DOA: 09/04/2023 PCP: Dwana Melena, PA  214A/214A-AA  LOS: 3 days   Brief hospital course:   Assessment & Plan:  Tracy Dickerson is a 83 y.o. female with medical history significant of hypertension, hyperlipidemia, diabetes mellitus, GERD, breast cancer, CKD-3a, septic shock due to UTI, PAF on Eliquis, orthostatic hypotension on Cortef, ho presents with SOB   Patient has been in the ED for extended period of time since 09/03/24 due to agitation. Patient was evaluated by psychiatry, waiting for placement.  Per ED physician, this morning, patient to be, weaker, developed cough, shortness of breath mild chest pain, found to have oxygen desaturation to 87% on room air, which improved to 95% on 2 L oxygen.  This is new oxygen requirement.     Acute hypoxemic respiratory failure 2/2 Atypical pneumonia, ILD (interstitial lung disease) and COPD (chronic obstructive pulmonary disease) (HCC):  Patient has 2 L new oxygen requirement now.  No leukocytosis or fever.  Clinically not septic. --started on IV Rocephin and azithromycin and Solu-Medrol 80 daily.  Steroid d/c'ed. --on room air by 3/1 --cont abx to finish 5 days --cont bronchodilator   HTN (hypertension):  Blood pressure is soft.  Patient is not taking blood pressure medications currently --monitor   HLD (hyperlipidemia) --cont statin   PAF (paroxysmal atrial fibrillation) (HCC):  --cont Eliquis   CKD stage 3a, GFR 45-59 ml/min [facility]): Renal function stable.   Orthostatic hypotension --cont Cortef   Lung nodule:  This is incidental findings by CT. -Need to follow-up with PCP as outpatient   Dementia with behavioral disturbance (HCC) -Fall precaution -Haldol prn for agitation   DVT prophylaxis: [id number]:AOZHYQM Code Status: Full code  Family Communication:  Level of care: Telemetry Medical Dispo:   The patient is from: home Anticipated d/c is to: to be  determined Anticipated d/c date is: whenever disposition found   Subjective and Interval History:  Pt said she was doing fine and just wanted to go home.   Objective: Vitals:   11/24/23 0416 11/24/23 0756 11/24/23 1454 11/24/23 1455  BP: (!) 114/51 136/62 (!) 142/60   Pulse: 77 82 73   Resp: 16 19 16    Temp: 97.8 F (36.6 C) 97.7 F (36.5 C)  (!) 97.4 F (36.3 C)  TempSrc:    Oral  SpO2: 96% 97% 97%   Weight:      Height:        Intake/Output Summary (Last 24 hours) at 11/24/2023 1759 Last data filed at 11/24/2023 0900 Gross per 24 hour  Intake 240 ml  Output --  Net 240 ml   Filed Weights   09/04/23 1035  Weight: 90.7 kg    Examination:   Constitutional: NAD, alert, oriented to person and place HEENT: conjunctivae and lids normal, EOMI CV: No cyanosis.   RESP: normal respiratory effort, on RA Neuro: II - XII grossly intact.     Data Reviewed: I have personally reviewed labs and imaging studies  Time spent: 35 minutes  Darlin Priestly, MD [facility] If 7PM-7AM, please contact night-coverage 11/24/2023, 5:59 PM

## 2023-11-24 NOTE — Plan of Care (Signed)

## 2023-11-25 DIAGNOSIS — J189 Pneumonia, unspecified organism: Secondary | ICD-10-CM | POA: Diagnosis not present

## 2023-11-25 MED ORDER — AZITHROMYCIN 250 MG PO TABS
500.0000 mg | ORAL_TABLET | Freq: Every day | ORAL | Status: AC
  Administered 2023-11-25 – 2023-11-26 (×2): 500 mg via ORAL
  Filled 2023-11-25 (×2): qty 2

## 2023-11-25 NOTE — Progress Notes (Signed)
  PROGRESS NOTE    Tracy Dickerson  ZOX:[id number] DOB: 1940/12/14 DOA: 09/04/2023 PCP: Dwana Melena, PA  214A/214A-AA  LOS: 4 days   Brief hospital course:   Assessment & Plan:  Tracy Dickerson is a 83 y.o. female with medical history significant of hypertension, hyperlipidemia, diabetes mellitus, GERD, breast cancer, CKD-3a, septic shock due to UTI, PAF on Eliquis, orthostatic hypotension on Cortef, presented with SOB   Patient has been in the ED for extended period of time since 09/03/24 due to agitation. Patient was evaluated by psychiatry, waiting for placement.  Per ED physician, morning of 11/21/23, patient appeared to be, weaker, developed cough, shortness of breath mild chest pain, found to have oxygen desaturation to 87% on room air, which improved to 95% on 2 L oxygen.  This is new oxygen requirement.  Pt therefore was admitted to the hospitalist service.   Acute hypoxemic respiratory failure 2/2 Atypical pneumonia, ILD (interstitial lung disease) and COPD (chronic obstructive pulmonary disease) (HCC):  Patient had 2 L new oxygen requirement.  No leukocytosis or fever.  Clinically not septic. --started on IV Rocephin and azithromycin and Solu-Medrol 80 daily.  Steroid d/c'ed. --on room air by 3/1 --cont abx to finish 5 days --cont bronchodilator   HTN (hypertension):  Blood pressure is soft.  Patient is not taking blood pressure medications currently --monitor   HLD (hyperlipidemia) --cont statin   PAF (paroxysmal atrial fibrillation) (HCC):  --cont Eliquis   CKD stage 3a, GFR 45-59 ml/min [facility]): Renal function stable.   Orthostatic hypotension --cont Cortef   Lung nodule:  This is incidental findings by CT. -Need to follow-up with PCP as outpatient   Dementia with behavioral disturbance (HCC) -Fall precaution -Haldol prn for agitation   DVT prophylaxis: [id number] Code Status: Full code  Family Communication:  Level of care: Telemetry Medical Dispo:    The patient is from: home Anticipated d/c is to: to be determined.  Waiting on DSS guardianship. Anticipated d/c date is: whenever disposition found   Subjective and Interval History:  Pt said she was waiting for her lunch, hot dog and [place] fries.  Pt had no complaints, and said she just wanted to go home.     Objective: Vitals:   11/25/23 0409 11/25/23 0851 11/25/23 1758 11/25/23 2019  BP: (!) 115/54 (!) 142/61 135/61 (!) 147/73  Pulse: 72 75 71 74  Resp:  17 18 17   Temp: 98.4 F (36.9 C) 97.6 F (36.4 C) (!) 97.5 F (36.4 C) 97.6 F (36.4 C)  TempSrc:  Oral Oral Oral  SpO2: 94% 99% 97% 96%  Weight:      Height:        Intake/Output Summary (Last 24 hours) at 11/25/2023 2034 Last data filed at 11/25/2023 1045 Gross per 24 hour  Intake 0 ml  Output --  Net 0 ml   Filed Weights   09/04/23 1035  Weight: 90.7 kg    Examination:   Constitutional: NAD, alert HEENT: conjunctivae and lids normal, EOMI CV: No cyanosis.   RESP: normal respiratory effort, on RA Neuro: II - XII grossly intact.     Data Reviewed: I have personally reviewed labs and imaging studies  Time spent: 25 minutes  Darlin Priestly, MD [facility] If 7PM-7AM, please contact night-coverage 11/25/2023, 8:34 PM

## 2023-11-25 NOTE — Progress Notes (Signed)
 Mobility Specialist - Progress Note   11/25/23 1152  Mobility  Activity Ambulated with assistance in hallway  Level of Assistance Standby assist, set-up cues, supervision of patient - no hands on  Assistive Device Front wheel walker  Distance Ambulated (ft) 180 ft  Activity Response Tolerated well  Mobility visit 1 Mobility  Mobility Specialist Start Time (ACUTE ONLY) 1132  Mobility Specialist Stop Time (ACUTE ONLY) 1140  Mobility Specialist Time Calculation (min) (ACUTE ONLY) 8 min   Zetta Bills Mobility Specialist 11/25/23 11:54 AM

## 2023-11-25 NOTE — TOC Progression Note (Signed)
 Transition of Care [facility]) - Progression Note    Patient Details  Name: Tracy Dickerson MRN: [id number] Date of Birth: September 23, 1941  Transition of Care [facility]) CM/SW Contact  Chapman Fitch, RN Phone Number: 11/25/2023, 3:21 PM  Clinical Narrative:     Per Nia with DSS Guardianship has been obtained by [facility] and the case Worker is Donia Guiles [phone].  I have left a VM requesting return call to discuss disposition  Expected Discharge Plan: Long Term Nursing Home Barriers to Discharge: Continued Medical Work up  Expected Discharge Plan and Services In-house Referral: Clinical Social Work Discharge Planning Services: NA Post Acute Care Choice: Skilled Nursing Facility Living arrangements for the past 2 months: Single Family Home (Unsafe to return)                   DME Agency: NA     Representative spoke with at DME Agency: N/A HH Arranged: NA           Social Determinants of Health (SDOH) Interventions SDOH Screenings   Food Insecurity: No Food Insecurity (11/21/2023)  Housing: Low Risk  (11/21/2023)  Transportation Needs: No Transportation Needs (11/21/2023)  Utilities: Not At Risk (11/21/2023)  Financial Resource Strain: Low Risk  (02/04/2023)   Received from [facility], [facility]  Physical Activity: Inactive (07/16/2021)   Received from [facility], [facility]  Social Connections: Patient Unable To Answer (11/21/2023)  Stress: No Stress Concern Present (07/16/2021)   Received from [facility], [facility]  Tobacco Use: Medium Risk (09/04/2023)  Health Literacy: Medium Risk (07/16/2021)   Received from [facility], [facility]    Readmission Risk Interventions     No data to display

## 2023-11-26 DIAGNOSIS — F03918 Unspecified dementia, unspecified severity, with other behavioral disturbance: Secondary | ICD-10-CM | POA: Diagnosis not present

## 2023-11-26 DIAGNOSIS — J449 Chronic obstructive pulmonary disease, unspecified: Secondary | ICD-10-CM | POA: Diagnosis not present

## 2023-11-26 DIAGNOSIS — J849 Interstitial pulmonary disease, unspecified: Secondary | ICD-10-CM | POA: Diagnosis not present

## 2023-11-26 LAB — CBC
HCT: 34.5 % — ABNORMAL LOW (ref 36.0–46.0)
Hemoglobin: 11.3 g/dL — ABNORMAL LOW (ref 12.0–15.0)
MCH: 29.5 pg (ref 26.0–34.0)
MCHC: 32.8 g/dL (ref 30.0–36.0)
MCV: 90.1 fL (ref 80.0–100.0)
Platelets: 145 10*3/uL — ABNORMAL LOW (ref 150–400)
RBC: 3.83 MIL/uL — ABNORMAL LOW (ref 3.87–5.11)
RDW: 16 % — ABNORMAL HIGH (ref 11.5–15.5)
WBC: 5.7 10*3/uL (ref 4.0–10.5)
nRBC: 0 % (ref 0.0–0.2)

## 2023-11-26 LAB — BASIC METABOLIC PANEL
Anion gap: 6 (ref 5–15)
BUN: 33 mg/dL — ABNORMAL HIGH (ref 8–23)
CO2: 25 mmol/L (ref 22–32)
Calcium: 8.3 mg/dL — ABNORMAL LOW (ref 8.9–10.3)
Chloride: 110 mmol/L (ref 98–111)
Creatinine, Ser: 1.02 mg/dL — ABNORMAL HIGH (ref 0.44–1.00)
GFR, Estimated: 56 mL/min — ABNORMAL LOW (ref >60)
Glucose, Bld: 99 mg/dL (ref 70–99)
Potassium: 3.6 mmol/L (ref 3.5–5.1)
Sodium: 141 mmol/L (ref 135–145)

## 2023-11-26 LAB — CULTURE, BLOOD (ROUTINE X 2)
Culture: NO GROWTH
Culture: NO GROWTH

## 2023-11-26 MED ORDER — QUETIAPINE FUMARATE 25 MG PO TABS
25.0000 mg | ORAL_TABLET | Freq: Every day | ORAL | Status: DC
  Administered 2023-11-26 – 2023-12-30 (×35): 25 mg via ORAL
  Filled 2023-11-26 (×35): qty 1

## 2023-11-26 MED ORDER — TUBERCULIN PPD 5 UNIT/0.1ML ID SOLN
5.0000 [IU] | Freq: Once | INTRADERMAL | Status: AC
  Administered 2023-11-26: 5 [IU] via INTRADERMAL
  Filled 2023-11-26: qty 0.1

## 2023-11-26 NOTE — TOC Progression Note (Signed)
 Transition of Care [facility]) - Progression Note    Patient Details  Name: Tracy Dickerson MRN: [id number] Date of Birth: Apr 28, 1941  Transition of Care [facility]) CM/SW Contact  Chapman Fitch, RN Phone Number: 11/26/2023, 3:28 PM  Clinical Narrative:     Per Donia Guiles Legal Guardian [facility] has confirmed they will accept patient.  Per [name] they have to have the GOP guardian ship paperwork in hand prior to admission, along with receipt that special assistance medicaid was applied for   Expected Discharge Plan: Long Term Nursing Home Barriers to Discharge: Continued Medical Work up  Expected Discharge Plan and Services In-house Referral: Clinical Social Work Discharge Planning Services: NA Post Acute Care Choice: Skilled Nursing Facility Living arrangements for the past 2 months: Single Family Home (Unsafe to return)                   DME Agency: NA     Representative spoke with at DME Agency: N/A HH Arranged: NA           Social Determinants of Health (SDOH) Interventions SDOH Screenings   Food Insecurity: No Food Insecurity (11/21/2023)  Housing: Low Risk  (11/21/2023)  Transportation Needs: No Transportation Needs (11/21/2023)  Utilities: Not At Risk (11/21/2023)  Financial Resource Strain: Low Risk  (02/04/2023)   Received from [facility], [facility]  Physical Activity: Inactive (07/16/2021)   Received from [facility], [facility]  Social Connections: Patient Unable To Answer (11/21/2023)  Stress: No Stress Concern Present (07/16/2021)   Received from [facility], [facility]  Tobacco Use: Medium Risk (09/04/2023)  Health Literacy: Medium Risk (07/16/2021)   Received from [facility], [facility]    Readmission Risk Interventions     No data to display

## 2023-11-26 NOTE — Progress Notes (Signed)
 Progress Note   Patient: Tracy Dickerson NFA:[id number] DOB: 07-11-1941 DOA: 09/04/2023     5 DOS: the patient was seen and examined on 11/26/2023   Brief hospital course: KEELIA GRAYBILL is a 83 y.o. female with medical history significant of hypertension, hyperlipidemia, diabetes mellitus, GERD, breast cancer, CKD-3a, septic shock due to UTI, PAF on Eliquis, orthostatic hypotension on Cortef, presented with SOB   Patient has been in the ED for extended period of time since 09/04/23 due to agitation. Patient was evaluated by psychiatry, waiting for placement.  Per ED physician, morning of 11/21/23, patient appeared to be, weaker, developed cough, shortness of breath mild chest pain, found to have oxygen desaturation to 87% on room air, which improved to 95% on 2 L oxygen.  This is new oxygen requirement.  Pt therefore was admitted to the hospitalist service.  She is treated for atypical pneumonia.  Condition has improved.  Currently pending nursing home placement.   Principal Problem:   Atypical pneumonia Active Problems:   ILD (interstitial lung disease) (HCC)   COPD (chronic obstructive pulmonary disease) (HCC)   HTN (hypertension)   HLD (hyperlipidemia)   PAF (paroxysmal atrial fibrillation) (HCC)   CKD stage 3a, GFR 45-59 ml/min (HCC)   Orthostatic hypotension   Lung nodule   Dementia with behavioral disturbance (HCC)   Assessment and Plan: Acute hypoxemic respiratory failure 2/2 Atypical pneumonia, ILD (interstitial lung disease) and COPD (chronic obstructive pulmonary disease) (HCC):  Patient had 2 L new oxygen requirement.  No leukocytosis or fever.  Clinically not septic. --started on IV Rocephin and azithromycin and Solu-Medrol 80 daily.  Steroid d/c'ed. --on room air by 3/1 Antibiotics completed.  Patient improved.  Off oxygen.   HTN (hypertension):  Blood pressure not significant elevated.   HLD (hyperlipidemia) --cont statin   PAF (paroxysmal atrial fibrillation)  (HCC):  --cont Eliquis   CKD stage 3a, GFR 45-59 ml/min [facility]): Renal function stable.   Orthostatic hypotension --cont Cortef   Lung nodule:  This is incidental findings by CT. -Need to follow-up with PCP as outpatient   Dementia with behavioral disturbance [facility]) -Fall precaution -Haldol prn for agitation          Subjective:  Patient seems to have some confusion, no agitation.  Not short of breath.  No hypoxia.  Physical Exam: Vitals:   11/25/23 1758 11/25/23 2019 11/26/23 0216 11/26/23 0722  BP: 135/61 (!) 147/73 (!) 123/58 134/62  Pulse: 71 74 67 61  Resp: 18 17 18 18   Temp: (!) 97.5 F (36.4 C) 97.6 F (36.4 C) 98.2 F (36.8 C) 97.7 F (36.5 C)  TempSrc: Oral Oral Oral Oral  SpO2: 97% 96% 94% 97%  Weight:      Height:       General exam: Appears calm and comfortable  Respiratory system: Clear to auscultation. Respiratory effort normal. Cardiovascular system: S1 & S2 heard, RRR. No JVD, murmurs, rubs, gallops or clicks. No pedal edema. Gastrointestinal system: Abdomen is nondistended, soft and nontender. No organomegaly or masses felt. Normal bowel sounds heard. Central nervous system: Alert and oriented x1. No focal neurological deficits. Extremities: Symmetric 5 x 5 power. Skin: No rashes, lesions or ulcers Psychiatry: Mood & affect appropriate.    Data Reviewed:  Lab results reviewed.  Family Communication: None  Disposition: Status is: Inpatient Remains inpatient appropriate because: Unsafe discharge. Patient can potentially go to the facility on Tuesday, PPD is placed today, need to COVID ordered on Monday.  Time spent: 35 minutes  Author: Marrion Coy, MD 11/26/2023 12:16 PM  For on call review [email or web address].

## 2023-11-26 NOTE — Progress Notes (Signed)
 Mobility Specialist - Progress Note   11/26/23 1022  Mobility  Activity Ambulated with assistance in hallway  Level of Assistance Standby assist, set-up cues, supervision of patient - no hands on  Assistive Device Front wheel walker  Distance Ambulated (ft) 180 ft  Activity Response Tolerated well  Mobility visit 1 Mobility  Mobility Specialist Start Time (ACUTE ONLY) 0941  Mobility Specialist Stop Time (ACUTE ONLY) 0948  Mobility Specialist Time Calculation (min) (ACUTE ONLY) 7 min   Pt sitting in the recliner, utilizing RA. Pt agreeable to amb within the hallway this date. Pt STS to RW and amb one lap around the NS with supervision. Pt returned to the room, left seated in the recliner with alarm set and needs within reach.   Zetta Bills Mobility Specialist 11/26/23 10:27 AM

## 2023-11-26 NOTE — TOC Progression Note (Signed)
 Transition of Care [facility]) - Progression Note    Patient Details  Name: Tracy Dickerson MRN: [id number] Date of Birth: 03-07-1941  [facility]) CM/SW Contact  Margarito Liner, LCSW Phone Number: 11/26/2023, 12:16 PM  Clinical Narrative:   CSW received call from director at [facility]. They are hoping to be able to accept patient by Tuesday. They requested a TB test, COVID test, and FL2 with discharge medications. MD will order PPD. Will order COVID test on Monday. Will complete FL2 once discharge summary is available. CSW gave facility director the phone numbers for the DSS social worker and legal guardian. She wants DSS to go ahead and apply for special assistance Medicaid.  Expected Discharge Plan: Long Term Nursing Home Barriers to Discharge: Continued Medical Work up  Expected Discharge Plan and Services In-house Referral: Clinical Social Work Discharge Planning Services: NA Post Acute Care Choice: Skilled Nursing Facility Living arrangements for the past 2 months: Single Family Home (Unsafe to return)                   DME Agency: NA     Representative spoke with at DME Agency: N/A HH Arranged: NA           Social Determinants of Health (SDOH) Interventions SDOH Screenings   Food Insecurity: No Food Insecurity (11/21/2023)  Housing: Low Risk  (11/21/2023)  Transportation Needs: No Transportation Needs (11/21/2023)  Utilities: Not At Risk (11/21/2023)  Financial Resource Strain: Low Risk  (02/04/2023)   Received from [facility], [facility]  Physical Activity: Inactive (07/16/2021)   Received from [facility], [facility]  Social Connections: Patient Unable To Answer (11/21/2023)  Stress: No Stress Concern Present (07/16/2021)   Received from [facility], [facility]  Tobacco Use: Medium Risk (09/04/2023)  Health Literacy: Medium Risk (07/16/2021)   [facility] from  [facility], [facility]    Readmission Risk Interventions      No data to display

## 2023-11-26 NOTE — Hospital Course (Signed)
 Tracy Dickerson is a 83 y.o. female with medical history significant of hypertension, hyperlipidemia, diabetes mellitus, GERD, breast cancer, CKD-3a, septic shock due to UTI, PAF on Eliquis, orthostatic hypotension on Cortef, presented with SOB   Patient has been in the ED for extended period of time since 09/04/23 due to agitation. Patient was evaluated by psychiatry, waiting for placement.  Per ED physician, morning of 11/21/23, patient appeared to be, weaker, developed cough, shortness of breath mild chest pain, found to have oxygen desaturation to 87% on room air, which improved to 95% on 2 L oxygen.  This is new oxygen requirement.  Pt therefore was admitted to the hospitalist service.  She is treated for atypical pneumonia.  Condition has improved.  Currently pending nursing home placement.

## 2023-11-27 DIAGNOSIS — J849 Interstitial pulmonary disease, unspecified: Secondary | ICD-10-CM | POA: Diagnosis not present

## 2023-11-27 DIAGNOSIS — F03918 Unspecified dementia, unspecified severity, with other behavioral disturbance: Secondary | ICD-10-CM | POA: Diagnosis not present

## 2023-11-27 MED ORDER — SENNOSIDES-DOCUSATE SODIUM 8.6-50 MG PO TABS
2.0000 | ORAL_TABLET | Freq: Two times a day (BID) | ORAL | Status: DC
  Administered 2023-11-27 – 2023-12-31 (×69): 2 via ORAL
  Filled 2023-11-27 (×69): qty 2

## 2023-11-27 NOTE — Progress Notes (Signed)
 Mobility Specialist - Progress Note   11/27/23 1153  Mobility  Activity Ambulated with assistance in hallway  Level of Assistance Standby assist, set-up cues, supervision of patient - no hands on  Assistive Device Front wheel walker  Distance Ambulated (ft) 320 ft  Activity Response Tolerated well  Mobility visit 1 Mobility  Mobility Specialist Start Time (ACUTE ONLY) 1113  Mobility Specialist Stop Time (ACUTE ONLY) 1124  Mobility Specialist Time Calculation (min) (ACUTE ONLY) 11 min   Pt sitting in the recliner, utilizing RA. Pt agreeable to amb within the hallway this date. Pt STS to RW and amb two laps around the NS with supervision, tolerated well. Pt returned to the room, left seated in the recliner with alarm set and needs within reach.  Zetta Bills Mobility Specialist 11/27/23 12:05 PM

## 2023-11-27 NOTE — Progress Notes (Signed)
 Progress Note   Patient: Tracy Dickerson:[id number] DOB: October 18, 1940 DOA: 09/04/2023     6 DOS: the patient was seen and examined on 11/27/2023   Brief hospital course: Tracy Dickerson is a 83 y.o. female with medical history significant of hypertension, hyperlipidemia, diabetes mellitus, GERD, breast cancer, CKD-3a, septic shock due to UTI, PAF on Eliquis, orthostatic hypotension on Cortef, presented with SOB   Patient has been in the ED for extended period of time since 09/04/23 due to agitation. Patient was evaluated by psychiatry, waiting for placement.  Per ED physician, morning of 11/21/23, patient appeared to be, weaker, developed cough, shortness of breath mild chest pain, found to have oxygen desaturation to 87% on room air, which improved to 95% on 2 L oxygen.  This is new oxygen requirement.  Pt therefore was admitted to the hospitalist service.  She is treated for atypical pneumonia.  Condition has improved.  Currently pending nursing home placement.   Principal Problem:   Atypical pneumonia Active Problems:   ILD (interstitial lung disease) (HCC)   COPD (chronic obstructive pulmonary disease) (HCC)   HTN (hypertension)   HLD (hyperlipidemia)   PAF (paroxysmal atrial fibrillation) (HCC)   CKD stage 3a, GFR 45-59 ml/min (HCC)   Orthostatic hypotension   Lung nodule   Dementia with behavioral disturbance (HCC)   Assessment and Plan: Acute hypoxemic respiratory failure 2/2 Atypical pneumonia, ILD (interstitial lung disease) and COPD (chronic obstructive pulmonary disease) (HCC):  Patient had 2 L new oxygen requirement.  No leukocytosis or fever.  Clinically not septic. --started on IV Rocephin and azithromycin and Solu-Medrol 80 daily.  Steroid d/c'ed. --on room air by 3/1 Antibiotics completed.  Patient improved.  Off oxygen.   HTN (hypertension):  Blood pressure not significant elevated.   HLD (hyperlipidemia) --cont statin   PAF (paroxysmal atrial fibrillation)  (HCC):  --cont Eliquis   CKD stage 3a, GFR 45-59 ml/min [facility]): Renal function stable.   Orthostatic hypotension --cont Cortef   Lung nodule:  This is incidental findings by CT. -Need to follow-up with PCP as outpatient   Dementia with behavioral disturbance (HCC) Insomnia. -Fall precaution -Haldol prn for agitation She was started on Seroquel last night,  slept through the night.          Subjective:  Patient is frustrated, want to go home.  Otherwise no other complaint.  Physical Exam: Vitals:   11/26/23 0722 11/26/23 1559 11/26/23 1941 11/27/23 0748  BP: 134/62 (!) 141/54 (!) 128/56 (!) 118/47  Pulse: 61 66 69 66  Resp: 18 16 20 18   Temp: 97.7 F (36.5 C) 97.6 F (36.4 C) 98.3 F (36.8 C) (!) 97.5 F (36.4 C)  TempSrc: Oral   Oral  SpO2: 97% 100% 97% 93%  Weight:      Height:       General exam: Appears calm and comfortable  Respiratory system: Clear to auscultation. Respiratory effort normal. Cardiovascular system: S1 & S2 heard, RRR. No JVD, murmurs, rubs, gallops or clicks. No pedal edema. Gastrointestinal system: Abdomen is nondistended, soft and nontender. No organomegaly or masses felt. Normal bowel sounds heard. Central nervous system: Alert and oriented x1. No focal neurological deficits. Extremities: Symmetric 5 x 5 power. Skin: No rashes, lesions or ulcers Psychiatry: Mood & affect appropriate.    Data Reviewed:  There are no new results to review at this time.  Family Communication: None  Disposition: Status is: Inpatient Remains inpatient appropriate because: Unsafe discharge. Potentially has discharge option on Tuesday,  PPD placed yesterday, will perform COVID on Monday.     Time spent: 35 minutes  Author: Marrion Coy, MD 11/27/2023 1:06 PM  For on call review [email or web address].

## 2023-11-28 DIAGNOSIS — J849 Interstitial pulmonary disease, unspecified: Secondary | ICD-10-CM | POA: Diagnosis not present

## 2023-11-28 DIAGNOSIS — J449 Chronic obstructive pulmonary disease, unspecified: Secondary | ICD-10-CM | POA: Diagnosis not present

## 2023-11-28 DIAGNOSIS — F03918 Unspecified dementia, unspecified severity, with other behavioral disturbance: Secondary | ICD-10-CM | POA: Diagnosis not present

## 2023-11-28 LAB — CBC
HCT: 33.1 % — ABNORMAL LOW (ref 36.0–46.0)
Hemoglobin: 10.9 g/dL — ABNORMAL LOW (ref 12.0–15.0)
MCH: 30.1 pg (ref 26.0–34.0)
MCHC: 32.9 g/dL (ref 30.0–36.0)
MCV: 91.4 fL (ref 80.0–100.0)
Platelets: 141 10*3/uL — ABNORMAL LOW (ref 150–400)
RBC: 3.62 MIL/uL — ABNORMAL LOW (ref 3.87–5.11)
RDW: 15.7 % — ABNORMAL HIGH (ref 11.5–15.5)
WBC: 8.6 10*3/uL (ref 4.0–10.5)
nRBC: 0 % (ref 0.0–0.2)

## 2023-11-28 NOTE — Plan of Care (Signed)
  Problem: Clinical Measurements: Goal: Ability to maintain clinical measurements within normal limits will improve Outcome: Progressing Goal: Respiratory complications will improve Outcome: Progressing Goal: Cardiovascular complication will be avoided Outcome: Progressing   Problem: Activity: Goal: Risk for activity intolerance will decrease Outcome: Progressing   Problem: Pain Managment: Goal: General experience of comfort will improve and/or be controlled Outcome: Progressing   Problem: Respiratory: Goal: Ability to maintain a clear airway will improve Outcome: Progressing   Problem: Education: Goal: Knowledge of General Education information will improve Description: Including pain rating scale, medication(s)/side effects and non-pharmacologic comfort measures Outcome: Not Progressing   Problem: Education: Goal: Knowledge of disease or condition will improve Outcome: Not Progressing

## 2023-11-28 NOTE — Progress Notes (Signed)
 Progress Note   Patient: Tracy Dickerson AOZ:[id number] DOB: [birth year] DOA: 09/04/2023     7 DOS: the patient was seen and examined on 11/28/2023   Brief hospital course: FELICE HOPE is a 83 y.o. female with medical history significant of hypertension, hyperlipidemia, diabetes mellitus, GERD, breast cancer, CKD-3a, septic shock due to UTI, PAF on Eliquis, orthostatic hypotension on Cortef, presented with SOB   Patient has been in the ED for extended period of time since 09/04/23 due to agitation. Patient was evaluated by psychiatry, waiting for placement.  Per ED physician, morning of 11/21/23, patient appeared to be, weaker, developed cough, shortness of breath mild chest pain, found to have oxygen desaturation to 87% on room air, which improved to 95% on 2 L oxygen.  This is new oxygen requirement.  Pt therefore was admitted to the hospitalist service.  She is treated for atypical pneumonia.  Condition has improved.  Currently pending nursing home placement.   Principal Problem:   Atypical pneumonia Active Problems:   ILD (interstitial lung disease) (HCC)   COPD (chronic obstructive pulmonary disease) (HCC)   HTN (hypertension)   HLD (hyperlipidemia)   PAF (paroxysmal atrial fibrillation) (HCC)   CKD stage 3a, GFR 45-59 ml/min (HCC)   Orthostatic hypotension   Lung nodule   Dementia with behavioral disturbance (HCC)   Assessment and Plan: Acute hypoxemic respiratory failure 2/2 Atypical pneumonia, ILD (interstitial lung disease) and COPD (chronic obstructive pulmonary disease) (HCC):  Patient had 2 L new oxygen requirement.  No leukocytosis or fever.  Clinically not septic. --started on IV Rocephin and azithromycin and Solu-Medrol 80 daily.  Steroid d/c'ed. --on room air by 3/1 Antibiotics completed.  Patient improved.  Off oxygen.   HTN (hypertension):  Blood pressure not significant elevated.   HLD (hyperlipidemia) --cont statin   PAF (paroxysmal atrial fibrillation)  (HCC):  --cont Eliquis   CKD stage 3a, GFR 45-59 ml/min [facility]): Renal function stable.   Orthostatic hypotension --cont Cortef   Lung nodule:  This is incidental findings by CT. -Need to follow-up with PCP as outpatient   Dementia with behavioral disturbance (HCC) Insomnia. -Fall precaution -Haldol prn for agitation Patient has not been sleeping well since placed on Seroquel.  Will continue.       Subjective:  Confused as baseline, no shortness of breath.  Appetite is good.  Physical Exam: Vitals:   11/27/23 1513 11/27/23 1935 11/28/23 0343 11/28/23 0844  BP: 113/80 (!) 119/54 (!) 116/45 (!) 114/59  Pulse: 66 79 69 72  Resp: 18 19 16 18   Temp: (!) 97.2 F (36.2 C) 98.3 F (36.8 C) 98.4 F (36.9 C) 98.2 F (36.8 C)  TempSrc: Oral Oral Axillary Oral  SpO2: 100% 99% 95% 96%  Weight:      Height:       General exam: Appears calm and comfortable  Respiratory system: Clear to auscultation. Respiratory effort normal. Cardiovascular system: S1 & S2 heard, RRR. No JVD, murmurs, rubs, gallops or clicks. No pedal edema. Gastrointestinal system: Abdomen is nondistended, soft and nontender. No organomegaly or masses felt. Normal bowel sounds heard. Central nervous system: Alert and oriented x1. No focal neurological deficits. Extremities: Symmetric 5 x 5 power. Skin: No rashes, lesions or ulcers Psychiatry:  Mood & affect appropriate.    Data Reviewed:  There are no new results to review at this time.  Family Communication: None  Disposition: Status is: Inpatient Remains inpatient appropriate because: Unsafe discharge option.     Time spent: 25  minutes  Author: Marrion Coy, MD 11/28/2023 12:29 PM  For on call review [email or web address].

## 2023-11-28 NOTE — Plan of Care (Signed)

## 2023-11-28 NOTE — TOC Progression Note (Signed)
 Transition of Care [facility]) - Progression Note    Patient Details  Name: Tracy Dickerson MRN: [id number] Date of Birth: 03-Jan-1941  [facility]) CM/SW Contact  Liliana Cline, LCSW Phone Number: 11/28/2023, 9:58 AM  Clinical Narrative:    CSW called Guardian Donia Guiles [phone], left a VM requesting a return call to follow up on any needs to ensure patient can DC to [facility] next week.   Expected Discharge Plan: Long Term Nursing Home Barriers to Discharge: Continued Medical Work up  Expected Discharge Plan and Services In-house Referral: Clinical Social Work Discharge Planning Services: NA Post Acute Care Choice: Skilled Nursing Facility Living arrangements for the past 2 months: Single Family Home (Unsafe to return)                   DME Agency: NA     Representative spoke with at DME Agency: N/A HH Arranged: NA           Social Determinants of Health (SDOH) Interventions SDOH Screenings   Food Insecurity: No Food Insecurity (11/21/2023)  Housing: Low Risk  (11/21/2023)  Transportation Needs: No Transportation Needs (11/21/2023)  Utilities: Not At Risk (11/21/2023)  Financial Resource Strain: Low Risk  (02/04/2023)   Received from [facility], [facility]  Physical Activity: Inactive (07/16/2021)   Received from [facility], [facility]  Social Connections: Patient Unable To Answer (11/21/2023)  Stress: No Stress Concern Present (07/16/2021)   Received from [facility], [facility]  Tobacco Use: Medium Risk (09/04/2023)  Health Literacy: Medium Risk (07/16/2021)   Received from [facility], [facility]    Readmission Risk Interventions     No data to display

## 2023-11-28 NOTE — Progress Notes (Signed)
 Mobility Specialist - Progress Note     11/28/23 1440  Mobility  Activity Ambulated independently in hallway;Stood at bedside  Level of Assistance Modified independent, requires aide device or extra time  Assistive Device Front wheel walker  Distance Ambulated (ft) 200 ft  Range of Motion/Exercises Active  Activity Response Tolerated well  Mobility Referral Yes  Mobility visit 1 Mobility   Pt resting in recliner on RA upon entry. Pt STS and ambulates to hallway around NS ModI. Pt returned to bed and left with needs in reach.   Johnathan Hausen Mobility Specialist 11/28/23, 3:12 PM

## 2023-11-28 NOTE — Progress Notes (Signed)
 Mobility Specialist - Progress Note   11/28/23 0927  Mobility  Activity Ambulated with assistance in hallway  Level of Assistance Standby assist, set-up cues, supervision of patient - no hands on  Assistive Device Front wheel walker  Distance Ambulated (ft) 180 ft  Activity Response Tolerated well  Mobility visit 1 Mobility  Mobility Specialist Start Time (ACUTE ONLY) [id number]  Mobility Specialist Stop Time (ACUTE ONLY) [id number]  Mobility Specialist Time Calculation (min) (ACUTE ONLY) 5 min   Pt sitting in the recliner upon entry, utilizing RA. Pt agreeable to amb within the hallway this date. Pt STS to RW and amb one lap around the NS with supervision, tolerated well-- steady speed. Pt returned to the room, left seated in the recliner with needs within reach. Sitter/NT present at bedside.  Zetta Bills Mobility Specialist 11/28/23 9:29 AM

## 2023-11-29 DIAGNOSIS — J849 Interstitial pulmonary disease, unspecified: Secondary | ICD-10-CM | POA: Diagnosis not present

## 2023-11-29 DIAGNOSIS — J449 Chronic obstructive pulmonary disease, unspecified: Secondary | ICD-10-CM | POA: Diagnosis not present

## 2023-11-29 DIAGNOSIS — F03918 Unspecified dementia, unspecified severity, with other behavioral disturbance: Secondary | ICD-10-CM | POA: Diagnosis not present

## 2023-11-29 MED ORDER — POLYETHYLENE GLYCOL 3350 17 G PO PACK
17.0000 g | PACK | Freq: Every day | ORAL | Status: DC | PRN
  Administered 2023-11-29: 17 g via ORAL
  Filled 2023-11-29: qty 1

## 2023-11-29 NOTE — Plan of Care (Signed)
  Problem: Activity: Goal: Risk for activity intolerance will decrease Outcome: Progressing   Problem: Nutrition: Goal: Adequate nutrition will be maintained Outcome: Progressing   Problem: Coping: Goal: Level of anxiety will decrease Outcome: Progressing    Pt ambulating in hallway with staff today. Up to chair. Pt oriented to self.

## 2023-11-29 NOTE — Plan of Care (Signed)

## 2023-11-29 NOTE — Progress Notes (Signed)
 Progress Note   Patient: Tracy Dickerson ZOX:[id number] DOB: [birth year] DOA: 09/04/2023     8 DOS: the patient was seen and examined on 11/29/2023   Brief Dickerson course: Tracy Dickerson is a 83 y.o. female with medical history significant of hypertension, hyperlipidemia, diabetes mellitus, GERD, breast cancer, CKD-3a, septic shock due to UTI, PAF on Eliquis, orthostatic hypotension on Cortef, presented with SOB   Patient has been in the ED for extended period of time since 09/04/23 due to agitation. Patient was evaluated by psychiatry, waiting for placement.  Per ED physician, morning of 11/21/23, patient appeared to be, weaker, developed cough, shortness of breath mild chest pain, found to have oxygen desaturation to 87% on room air, which improved to 95% on 2 L oxygen.  This is new oxygen requirement.  Pt therefore was admitted to the hospitalist service.  She is treated for atypical pneumonia.  Condition has improved.  Currently pending nursing home placement.   Principal Problem:   Atypical pneumonia Active Problems:   ILD (interstitial lung disease) (HCC)   COPD (chronic obstructive pulmonary disease) (HCC)   HTN (hypertension)   HLD (hyperlipidemia)   PAF (paroxysmal atrial fibrillation) (HCC)   CKD stage 3a, GFR 45-59 ml/min (HCC)   Orthostatic hypotension   Lung nodule   Dementia with behavioral disturbance (HCC)   Assessment and Plan:  Acute hypoxemic respiratory failure 2/2 Atypical pneumonia, ILD (interstitial lung disease) and COPD (chronic obstructive pulmonary disease) (HCC):  Patient had 2 L new oxygen requirement.  No leukocytosis or fever.  Clinically not septic. --started on IV Rocephin and azithromycin and Solu-Medrol 80 daily.  Steroid d/c'ed. --on room air by 3/1 Antibiotics completed.  Patient improved.  Off oxygen.   HTN (hypertension):  Blood pressure not significant elevated.   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   Orthostatic hypotension --cont Cortef   Lung nodule:  This is incidental findings by CT. -Need to follow-up with PCP as outpatient   Dementia with behavioral disturbance (HCC) Insomnia. -Fall precaution -Haldol prn for agitation Patient has not been sleeping well since placed on Seroquel.  Will continue.   Patient condition stable, no change in treatment plan today.  Pending placement.   Subjective:  Patient is confused as baseline.  Physical Exam: Vitals:   11/28/23 1945 11/28/23 2010 11/29/23 0350 11/29/23 0840  BP: (!) 109/58  (!) 123/59 119/62  Pulse: 75  63 70  Resp: 16  16   Temp: 98.1 F (36.7 C)  98.2 F (36.8 C) (!) 97 F (36.1 C)  TempSrc: Oral  Axillary   SpO2: 100%  95% 99%  Weight:  90.7 kg    Height:  5\' 6"  (1.676 m)     General exam: Appears calm and comfortable  Respiratory system: Clear to auscultation. Respiratory effort normal. Cardiovascular system: S1 & S2 heard, RRR. No JVD, murmurs, rubs, gallops or clicks. No pedal edema. Gastrointestinal system: Abdomen is nondistended, soft and nontender. No organomegaly or masses felt. Normal bowel sounds heard. Central nervous system: Alert and oriented x2. No focal neurological deficits. Extremities: Symmetric 5 x 5 power. Skin: No rashes, lesions or ulcers Psychiatry: Mood & affect appropriate.    Data Reviewed:  There are no new results to review at this time.  Family Communication: None  Disposition: Status is: Inpatient Remains inpatient appropriate because: Unsafe discharge option, planning transfer to  nursing home on Tuesday. Will check COVID on Monday.     Time spent: 25 minutes  Author: Marrion Coy, MD 11/29/2023 11:13 AM  For on call review [email or web address].

## 2023-11-29 NOTE — Progress Notes (Signed)
 Mobility Specialist - Progress Note     11/29/23 1447  Mobility  Activity Ambulated with assistance in hallway  Level of Assistance Standby assist, set-up cues, supervision of patient - no hands on  Assistive Device Front wheel walker  Distance Ambulated (ft) 200 ft  Range of Motion/Exercises Active  Activity Response Tolerated well  Mobility Referral Yes  Mobility visit 1 Mobility  Mobility Specialist Start Time (ACUTE ONLY) 1428  Mobility Specialist Stop Time (ACUTE ONLY) 1447  Mobility Specialist Time Calculation (min) (ACUTE ONLY) 19 min   Pt resting in bed on RA upon entry. Pt STS and ambulates to hallway around NS SBA with RW. Pt returned to bed and left with needs in reach. Bed alarm activated.   Johnathan Hausen Mobility Specialist 11/29/23, 2:50 PM

## 2023-11-30 DIAGNOSIS — F03918 Unspecified dementia, unspecified severity, with other behavioral disturbance: Secondary | ICD-10-CM | POA: Diagnosis not present

## 2023-11-30 DIAGNOSIS — J849 Interstitial pulmonary disease, unspecified: Secondary | ICD-10-CM | POA: Diagnosis not present

## 2023-11-30 DIAGNOSIS — J449 Chronic obstructive pulmonary disease, unspecified: Secondary | ICD-10-CM | POA: Diagnosis not present

## 2023-11-30 NOTE — Progress Notes (Signed)
 Progress Note   Patient: Tracy Dickerson NWG:[id number] DOB: 05/16/1941 DOA: 09/04/2023     9 DOS: the patient was seen and examined on 11/30/2023   Brief hospital course: Tracy Dickerson is a 83 y.o. female with medical history significant of hypertension, hyperlipidemia, diabetes mellitus, GERD, breast cancer, CKD-3a, septic shock due to UTI, PAF on Eliquis, orthostatic hypotension on Cortef, presented with SOB   Patient has been in the ED for extended period of time since 09/04/23 due to agitation. Patient was evaluated by psychiatry, waiting for placement.  Per ED physician, morning of 11/21/23, patient appeared to be, weaker, developed cough, shortness of breath mild chest pain, found to have oxygen desaturation to 87% on room air, which improved to 95% on 2 L oxygen.  This is new oxygen requirement.  Pt therefore was admitted to the hospitalist service.  She is treated for atypical pneumonia.  Condition has improved.  Currently pending nursing home placement.   Principal Problem:   Atypical pneumonia Active Problems:   ILD (interstitial lung disease) (HCC)   COPD (chronic obstructive pulmonary disease) (HCC)   HTN (hypertension)   HLD (hyperlipidemia)   PAF (paroxysmal atrial fibrillation) (HCC)   CKD stage 3a, GFR 45-59 ml/min (HCC)   Orthostatic hypotension   Lung nodule   Dementia with behavioral disturbance (HCC)   Assessment and Plan:  Acute hypoxemic respiratory failure 2/2 Atypical pneumonia, ILD (interstitial lung disease) and COPD (chronic obstructive pulmonary disease) (HCC):  Patient had 2 L new oxygen requirement.  No leukocytosis or fever.  Clinically not septic. --started on IV Rocephin and azithromycin and Solu-Medrol 80 daily.  Steroid d/c'ed. --on room air by 3/1 Antibiotics completed.  Patient improved.  Off oxygen.   HTN (hypertension):  Blood pressure not significant elevated.   HLD (hyperlipidemia) --cont statin   PAF (paroxysmal atrial fibrillation)  (HCC):  --cont Eliquis   CKD stage 3a, GFR 45-59 ml/min [facility]): Renal function stable.   Orthostatic hypotension --cont Cortef   Lung nodule:  This is incidental findings by CT. -Need to follow-up with PCP as outpatient   Dementia with behavioral disturbance (HCC) Insomnia. -Fall precaution -Haldol prn for agitation Patient has not been sleeping well since placed on Seroquel.  Will continue.  Doing well today, baseline confusion, no need to change treatment plan.    Subjective:  Patient has no complaint today.  Slept well last night.  Physical Exam: Vitals:   11/29/23 1553 11/29/23 1924 11/30/23 0422 11/30/23 0712  BP: 104/79 (!) 130/52 126/60 (!) 131/57  Pulse: 62 76 70 74  Resp: 16 16 16 16   Temp: 98.4 F (36.9 C) 98.2 F (36.8 C) 98.2 F (36.8 C) 97.7 F (36.5 C)  TempSrc: Oral Oral Oral Oral  SpO2: 92% 94% 96% 95%  Weight:      Height:       General exam: Appears calm and comfortable  Respiratory system: Clear to auscultation. Respiratory effort normal. Cardiovascular system: S1 & S2 heard, RRR. No JVD, murmurs, rubs, gallops or clicks. No pedal edema. Gastrointestinal system: Abdomen is nondistended, soft and nontender. No organomegaly or masses felt. Normal bowel sounds heard. Central nervous system: Alert and oriented x1. No focal neurological deficits. Extremities: Symmetric 5 x 5 power. Skin: No rashes, lesions or ulcers Psychiatry: Mood & affect appropriate.    Data Reviewed:  There are no new results to review at this time.  Family Communication: None  Disposition: Status is: Inpatient Remains inpatient appropriate because: UnSafe discharge.  Time spent: 25 minutes  Author: Marrion Coy, MD 11/30/2023 10:36 AM  For on call review [email or web address].

## 2023-12-01 DIAGNOSIS — J449 Chronic obstructive pulmonary disease, unspecified: Secondary | ICD-10-CM | POA: Diagnosis not present

## 2023-12-01 DIAGNOSIS — J849 Interstitial pulmonary disease, unspecified: Secondary | ICD-10-CM | POA: Diagnosis not present

## 2023-12-01 LAB — SARS CORONAVIRUS 2 BY RT PCR: SARS Coronavirus 2 by RT PCR: NEGATIVE

## 2023-12-01 NOTE — Progress Notes (Signed)
 Progress Note   Patient: Tracy Dickerson HQI:[id number] DOB: 11-16-1940 DOA: 09/04/2023     10 DOS: the patient was seen and examined on 12/01/2023   Brief hospital course: Tracy Dickerson is a 83 y.o. female with medical history significant of hypertension, hyperlipidemia, diabetes mellitus, GERD, breast cancer, CKD-3a, septic shock due to UTI, PAF on Eliquis, orthostatic hypotension on Cortef, presented with SOB   Patient has been in the ED for extended period of time since 09/04/23 due to agitation. Patient was evaluated by psychiatry, waiting for placement.  Per ED physician, morning of 11/21/23, patient appeared to be, weaker, developed cough, shortness of breath mild chest pain, found to have oxygen desaturation to 87% on room air, which improved to 95% on 2 L oxygen.  This is new oxygen requirement.  Pt therefore was admitted to the hospitalist service.  She is treated for atypical pneumonia.  Condition has improved.  Currently pending nursing home placement.   Principal Problem:   Atypical pneumonia Active Problems:   ILD (interstitial lung disease) (HCC)   COPD (chronic obstructive pulmonary disease) (HCC)   HTN (hypertension)   HLD (hyperlipidemia)   PAF (paroxysmal atrial fibrillation) (HCC)   CKD stage 3a, GFR 45-59 ml/min (HCC)   Orthostatic hypotension   Lung nodule   Dementia with behavioral disturbance (HCC)   Assessment and Plan:  Acute hypoxemic respiratory failure 2/2 Atypical pneumonia, ILD (interstitial lung disease) and COPD (chronic obstructive pulmonary disease) (HCC):  Patient had 2 L new oxygen requirement.  No leukocytosis or fever.  Clinically not septic. --started on IV Rocephin and azithromycin and Solu-Medrol 80 daily.  Steroid d/c'ed. --on room air by 3/1 Antibiotics completed.  Patient improved.  Off oxygen.   HTN (hypertension):  Blood pressure not significant elevated.   HLD (hyperlipidemia) --cont statin   PAF (paroxysmal atrial fibrillation)  (HCC):  --cont Eliquis   CKD stage 3a, GFR 45-59 ml/min [facility]): Renal function stable.   Orthostatic hypotension --cont Cortef   Lung nodule:  This is incidental findings by CT. -Need to follow-up with PCP as outpatient   Dementia with behavioral disturbance (HCC) Insomnia. -Fall precaution -Haldol prn for agitation Patient has not been sleeping well since placed on Seroquel.  Will continue.  Scheduled to be transferred to nursing home tomorrow, will obtain COVID test today per request.    Subjective:  Slept well, no complaint today.  Physical Exam: Vitals:   11/30/23 1620 11/30/23 2057 12/01/23 0350 12/01/23 0747  BP: 127/66 (!) 126/48 130/79 126/79  Pulse: 76 78 73 69  Resp: 16 16 16 18   Temp: 98.1 F (36.7 C) (!) 97.3 F (36.3 C) 98 F (36.7 C) 97.8 F (36.6 C)  TempSrc: Oral Oral Axillary Oral  SpO2: 96% 96% 96% 94%  Weight:      Height:       General exam: Appears calm and comfortable  Respiratory system: Clear to auscultation. Respiratory effort normal. Cardiovascular system: S1 & S2 heard, RRR. No JVD, murmurs, rubs, gallops or clicks. No pedal edema. Gastrointestinal system: Abdomen is nondistended, soft and nontender. No organomegaly or masses felt. Normal bowel sounds heard. Central nervous system: Alert and oriented x1. No focal neurological deficits. Extremities: Symmetric 5 x 5 power. Skin: No rashes, lesions or ulcers Psychiatry:  Mood & affect appropriate.    Data Reviewed:  There are no new results to review at this time.  Family Communication: None  Disposition: Status is: Inpatient Remains inpatient appropriate because: Unsafe discharge.  Time spent: 25 minutes  Author: Marrion Coy, MD 12/01/2023 12:49 PM  For on call review [email or web address].

## 2023-12-01 NOTE — Plan of Care (Signed)

## 2023-12-01 NOTE — Progress Notes (Signed)
 Mobility Specialist - Progress Note   12/01/23 1459  Mobility  Activity Ambulated with assistance in hallway  Level of Assistance Standby assist, set-up cues, supervision of patient - no hands on  Assistive Device Front wheel walker  Distance Ambulated (ft) 160 ft  Activity Response Tolerated well  Mobility visit 1 Mobility  Mobility Specialist Start Time (ACUTE ONLY) 1426  Mobility Specialist Stop Time (ACUTE ONLY) 1435  Mobility Specialist Time Calculation (min) (ACUTE ONLY) 9 min   Pt supine upon entry, utilizing RA. Pt completed bed mob ModI, STS to RW MinA and amb one lap around the NS with supervision. Pt returned to the room, left supine with alarm set and needs within reach.   Zetta Bills Mobility Specialist 12/01/23 3:02 PM

## 2023-12-02 DIAGNOSIS — J849 Interstitial pulmonary disease, unspecified: Secondary | ICD-10-CM | POA: Diagnosis not present

## 2023-12-02 DIAGNOSIS — J449 Chronic obstructive pulmonary disease, unspecified: Secondary | ICD-10-CM | POA: Diagnosis not present

## 2023-12-02 NOTE — Plan of Care (Signed)
   Problem: Education: Goal: Knowledge of General Education information will improve Description: Including pain rating scale, medication(s)/side effects and non-pharmacologic comfort measures Outcome: Progressing   Problem: Health Behavior/Discharge Planning: Goal: Ability to manage health-related needs will improve Outcome: Progressing   Problem: Activity: Goal: Risk for activity intolerance will decrease Outcome: Progressing

## 2023-12-02 NOTE — Progress Notes (Signed)
 Progress Note   Patient: Tracy Dickerson [id number] DOB: 09-22-1941 DOA: 09/04/2023     11 DOS: the patient was seen and examined on 12/02/2023   Brief hospital course: DARIELA STOKER is a 83 y.o. female with medical history significant of hypertension, hyperlipidemia, diabetes mellitus, GERD, breast cancer, CKD-3a, septic shock due to UTI, PAF on Eliquis, orthostatic hypotension on Cortef, presented with SOB   Patient has been in the ED for extended period of time since 09/04/23 due to agitation. Patient was evaluated by psychiatry, waiting for placement.  Per ED physician, morning of 11/21/23, patient appeared to be, weaker, developed cough, shortness of breath mild chest pain, found to have oxygen desaturation to 87% on room air, which improved to 95% on 2 L oxygen.  This is new oxygen requirement.  Pt therefore was admitted to the hospitalist service.  She is treated for atypical pneumonia.  Condition has improved.  Currently pending nursing home placement.   Principal Problem:   Atypical pneumonia Active Problems:   ILD (interstitial lung disease) (HCC)   COPD (chronic obstructive pulmonary disease) (HCC)   HTN (hypertension)   HLD (hyperlipidemia)   PAF (paroxysmal atrial fibrillation) (HCC)   CKD stage 3a, GFR 45-59 ml/min (HCC)   Orthostatic hypotension   Lung nodule   Dementia with behavioral disturbance (HCC)   Assessment and Plan: Acute hypoxemic respiratory failure 2/2 Atypical pneumonia, ILD (interstitial lung disease) and COPD (chronic obstructive pulmonary disease) (HCC):  Patient had 2 L new oxygen requirement.  No leukocytosis or fever.  Clinically not septic. --started on IV Rocephin and azithromycin and Solu-Medrol 80 daily.  Steroid d/c'ed. --on room air by 3/1 Antibiotics completed.  Patient improved.  Off oxygen.   HTN (hypertension):  Blood pressure not significant elevated.   HLD (hyperlipidemia) --cont statin   PAF (paroxysmal atrial fibrillation)  (HCC):  --cont Eliquis   CKD stage 3a, GFR 45-59 ml/min [facility]): Renal function stable.   Orthostatic hypotension --cont Cortef   Lung nodule:  This is incidental findings by CT. -Need to follow-up with PCP as outpatient   Dementia with behavioral disturbance (HCC) Insomnia. -Fall precaution -Haldol prn for agitation Patient has not been sleeping well since placed on Seroquel.  Will continue.   Patient is accepted to a nursing home, COVID testing negative, PPD testing negative.  Discussed with TOC, called family multiple times, no response.  Family has to initiate the process of transfer.  TOC is working on it.    Subjective:  Patient has no complaint today, slept well last night.  No additional agitation.  Physical Exam: Vitals:   12/01/23 1523 12/01/23 1930 12/02/23 0408 12/02/23 0907  BP: (!) (P) 142/76 (!) 135/99 133/66 (!) 151/85  Pulse: (P) 82 62 (!) 59 66  Resp: (P) 16 20 19 18   Temp: (P) 97.9 F (36.6 C) (!) 97.5 F (36.4 C) 97.8 F (36.6 C) 97.9 F (36.6 C)  TempSrc: (P) Oral Oral    SpO2: (P) 98% 96% 98% 97%  Weight:      Height:       General exam: Appears calm and comfortable  Respiratory system: Clear to auscultation. Respiratory effort normal. Cardiovascular system: S1 & S2 heard, RRR. No JVD, murmurs, rubs, gallops or clicks. No pedal edema. Gastrointestinal system: Abdomen is nondistended, soft and nontender. No organomegaly or masses felt. Normal bowel sounds heard. Central nervous system: Alert and oriented x1. No focal neurological deficits. Extremities: Symmetric 5 x 5 power. Skin: No rashes, lesions or  ulcers Psychiatry:  Mood & affect appropriate.    Data Reviewed:  There are no new results to review at this time.  Family Communication: None  Disposition: Status is: Inpatient Remains inpatient appropriate because: Unsafe discharge option.     Time spent: 25 minutes  Author: Marrion Coy, MD 12/02/2023 12:03 PM  For on call  review [email or web address].

## 2023-12-02 NOTE — Progress Notes (Signed)
 Mobility Specialist - Progress Note   12/02/23 1027  Mobility  Activity Ambulated with assistance in hallway  Level of Assistance Standby assist, set-up cues, supervision of patient - no hands on  Assistive Device Front wheel walker  Distance Ambulated (ft) 160 ft  Activity Response Tolerated well  Mobility visit 1 Mobility  Mobility Specialist Start Time (ACUTE ONLY) 1008  Mobility Specialist Stop Time (ACUTE ONLY) 1019  Mobility Specialist Time Calculation (min) (ACUTE ONLY) 11 min   Pt supine upon entry, utilizing RA. Pt motivated and agreeable to OOB amb this date. Pt completed bed mob ModI, STS to RW MinA and amb one lap around the NS with supervision--- tolerated well. Pt returned to the room, left supine with alarm set and needs within reach.  Zetta Bills Mobility Specialist 12/02/23 10:30 AM

## 2023-12-03 DIAGNOSIS — J189 Pneumonia, unspecified organism: Secondary | ICD-10-CM | POA: Diagnosis not present

## 2023-12-03 NOTE — TOC Progression Note (Signed)
 Transition of Care [facility]) - Progression Note    Patient Details  Name: Tracy Dickerson MRN: [id number] Date of Birth: Feb 02, 1941  [facility]) CM/SW Contact  Chapman Fitch, RN Phone Number: 12/03/2023, 1:08 PM  Clinical Narrative:     Per [name] at [facility] "Ms. Riehle guardian at litem has a appointment on 3/19 for her special assistance application in order for [place] to receive her. That application MUST be in place." Before patient can admit  Expected Discharge Plan: Long Term Nursing Home Barriers to Discharge: Continued Medical Work up  Expected Discharge Plan and Services In-house Referral: Clinical Social Work Discharge Planning Services: NA Post Acute Care Choice: Skilled Nursing Facility Living arrangements for the past 2 months: Single Family Home (Unsafe to return)                   DME Agency: NA     Representative spoke with at DME Agency: N/A HH Arranged: NA           Social Determinants of Health (SDOH) Interventions SDOH Screenings   Food Insecurity: No Food Insecurity (11/21/2023)  Housing: Low Risk  (11/21/2023)  Transportation Needs: No Transportation Needs (11/21/2023)  Utilities: Not At Risk (11/21/2023)  Financial Resource Strain: Low Risk  (02/04/2023)   Received from [facility], [facility]  Physical Activity: Inactive (07/16/2021)   Received from [facility], [facility]  Social Connections: Patient Unable To Answer (11/21/2023)  Stress: No Stress Concern Present (07/16/2021)   Received from [facility], [facility]  Tobacco Use: Medium Risk (09/04/2023)  Health Literacy: Medium Risk (07/16/2021)   Received from [facility], [facility]    Readmission Risk Interventions     No data to display

## 2023-12-03 NOTE — Plan of Care (Signed)
  Problem: Activity: Goal: Risk for activity intolerance will decrease Outcome: Progressing   Problem: Nutrition: Goal: Adequate nutrition will be maintained Outcome: Progressing   Problem: Coping: Goal: Level of anxiety will decrease Outcome: Progressing   Problem: Elimination: Goal: Will not experience complications related to bowel motility Outcome: Progressing Goal: Will not experience complications related to urinary retention Outcome: Progressing   Problem: Pain Managment: Goal: General experience of comfort will improve and/or be controlled Outcome: Progressing   Problem: Safety: Goal: Ability to remain free from injury will improve Outcome: Progressing   Problem: Respiratory: Goal: Ability to maintain a clear airway will improve Outcome: Progressing

## 2023-12-03 NOTE — Plan of Care (Signed)

## 2023-12-03 NOTE — TOC Progression Note (Signed)
 Transition of Care [facility]) - Progression Note    Patient Details  Name: Tracy Dickerson MRN: [id number] Date of Birth: [birth year]  Transition of Care [facility]) CM/SW Contact  Chapman Fitch, RN Phone Number: 12/03/2023, 12:40 PM  Clinical Narrative:     VM left for Guardian [name] to determine if paperwork has been done for patient to admit to [facility]  Email sent to [facility] at [facility] to determine if there as been an update  Expected Discharge Plan: Long Term Nursing Home Barriers to Discharge: Continued Medical Work up  Expected Discharge Plan and Services In-house Referral: Clinical Social Work Discharge Planning Services: NA Post Acute Care Choice: Skilled Nursing Facility Living arrangements for the past 2 months: Single Family Home (Unsafe to return)                   DME Agency: NA     Representative spoke with at DME Agency: N/A HH Arranged: NA           Social Determinants of Health (SDOH) Interventions SDOH Screenings   Food Insecurity: No Food Insecurity (11/21/2023)  Housing: Low Risk  (11/21/2023)  Transportation Needs: No Transportation Needs (11/21/2023)  Utilities: Not At Risk (11/21/2023)  Financial Resource Strain: Low Risk  (02/04/2023)   Received from [facility], [facility]  Physical Activity: Inactive (07/16/2021)   Received from [facility], [facility]  Social Connections: Patient Unable To Answer (11/21/2023)  Stress: No Stress Concern Present (07/16/2021)   Received from [facility], [facility]  Tobacco Use: Medium Risk (09/04/2023)  Health Literacy: Medium Risk (07/16/2021)   Received from [facility], [facility], [facility]    Readmission Risk Interventions     No data to display

## 2023-12-03 NOTE — Progress Notes (Signed)
 Mobility Specialist - Progress Note   12/03/23 1223  Mobility  Activity Ambulated with assistance in hallway;Ambulated with assistance to bathroom  Level of Assistance Standby assist, set-up cues, supervision of patient - no hands on  Assistive Device Front wheel walker  Distance Ambulated (ft) 180 ft  Activity Response Tolerated well  Mobility visit 1 Mobility  Mobility Specialist Start Time (ACUTE ONLY) 1154  Mobility Specialist Stop Time (ACUTE ONLY) 1207  Mobility Specialist Time Calculation (min) (ACUTE ONLY) 13 min   MS responding to call bell. Pt semi fowler upon entry, utilizing RA. Pt completed bed mob ModI, STS from elevated bed height MinA and amb to/from the bathroom with supervision--- one LOB when turing to wash hands, CGA for correction. Pt amb one lap around the NS with sup, tolerated well. Pt returned to the room, left supine with alarm set and needs within reach.  Zetta Bills Mobility Specialist 12/03/23 12:27 PM

## 2023-12-03 NOTE — Progress Notes (Signed)
  PROGRESS NOTE    ADAMARY SAVARY  BJY:[id number] DOB: 1941-04-02 DOA: 09/04/2023 PCP: Dwana Melena, PA  [id number]  LOS: 12 days   Brief hospital course:   Assessment & Plan: LONNETTE SHRODE is a 83 y.o. female with medical history significant of hypertension, hyperlipidemia, diabetes mellitus, GERD, breast cancer, CKD-3a, septic shock due to UTI, PAF on Eliquis, orthostatic hypotension on Cortef, presented with SOB   Patient has been in the ED for extended period of time since 09/04/23 due to agitation. Patient was evaluated by psychiatry, waiting for placement.  Per ED physician, morning of 11/21/23, patient appeared to be, weaker, developed cough, shortness of breath mild chest pain, found to have oxygen desaturation to 87% on room air, which improved to 95% on 2 L oxygen.  This is new oxygen requirement.  Pt therefore was admitted to the hospitalist service.   She is treated for atypical pneumonia.  Condition has improved.  Currently pending nursing home placement.   Acute hypoxemic respiratory failure 2/2 Atypical pneumonia, ILD (interstitial lung disease) and COPD (chronic obstructive pulmonary disease) (HCC):  Patient had 2 L new oxygen requirement.  No leukocytosis or fever.  Clinically not septic. --started on IV Rocephin and azithromycin and Solu-Medrol 80 daily.  Steroid d/c'ed. --on room air by 3/1 Antibiotics completed.  Patient improved.  Off oxygen.   HTN (hypertension):  Blood pressure not significant elevated.   HLD (hyperlipidemia) --cont statin   PAF (paroxysmal atrial fibrillation) (HCC):  --cont Eliquis   CKD stage 3a, GFR 45-59 ml/min [facility]): Renal function stable.   Orthostatic hypotension --cont Cortef   Lung nodule:  This is incidental findings by CT. -Need to follow-up with PCP as outpatient   Dementia with behavioral disturbance (HCC) Insomnia. -Fall precaution -Haldol prn for agitation Patient has not been sleeping well since placed on  Seroquel.  Will continue.    DVT prophylaxis: YQ:MVHQION Code Status: Full code  Family Communication:  Level of care: Telemetry Medical Dispo:   The patient is from: home Anticipated d/c is to: to be determined Anticipated d/c date is: whenever disposition found   Subjective and Interval History:  No new change today.   Objective: Vitals:   12/03/23 0349 12/03/23 0800 12/03/23 1654 12/03/23 1922  BP: 105/73 (!) 116/59 131/74 (!) 150/66  Pulse: (!) 57 62 65 76  Resp: 20 16 14 16   Temp: 97.8 F (36.6 C) 97.6 F (36.4 C) (!) 97.4 F (36.3 C) 98.3 F (36.8 C)  TempSrc: Oral Oral Oral   SpO2: 95% 96% 97% 95%  Weight:      Height:        Intake/Output Summary (Last 24 hours) at 12/03/2023 2009 Last data filed at 12/03/2023 1900 Gross per 24 hour  Intake 240 ml  Output --  Net 240 ml   Filed Weights   09/04/23 1035 11/28/23 2010  Weight: 90.7 kg 90.7 kg    Examination:   Constitutional: NAD, AAOx3 HEENT: conjunctivae and lids normal, EOMI CV: No cyanosis.   RESP: normal respiratory effort, on RA Neuro: II - XII grossly intact.     Data Reviewed: I have personally reviewed labs and imaging studies  Time spent: 25 minutes  Darlin Priestly, MD [facility] If 7PM-7AM, please contact night-coverage 12/03/2023, 8:09 PM

## 2023-12-04 DIAGNOSIS — J189 Pneumonia, unspecified organism: Secondary | ICD-10-CM | POA: Diagnosis not present

## 2023-12-04 LAB — CREATININE, SERUM
Creatinine, Ser: 0.91 mg/dL (ref 0.44–1.00)
GFR, Estimated: >60 mL/min (ref >60)

## 2023-12-04 LAB — CBC
HCT: 30.8 % — ABNORMAL LOW (ref 36.0–46.0)
Hemoglobin: 10.7 g/dL — ABNORMAL LOW (ref 12.0–15.0)
MCH: 32.6 pg (ref 26.0–34.0)
MCHC: 34.7 g/dL (ref 30.0–36.0)
MCV: 93.9 fL (ref 80.0–100.0)
Platelets: 155 10*3/uL (ref 150–400)
RBC: 3.28 MIL/uL — ABNORMAL LOW (ref 3.87–5.11)
RDW: 15.1 % (ref 11.5–15.5)
WBC: 7.5 10*3/uL (ref 4.0–10.5)
nRBC: 0 % (ref 0.0–0.2)

## 2023-12-04 NOTE — Plan of Care (Signed)
  Problem: Activity: Goal: Risk for activity intolerance will decrease Outcome: Progressing   Problem: Nutrition: Goal: Adequate nutrition will be maintained Outcome: Progressing   Problem: Coping: Goal: Level of anxiety will decrease Outcome: Progressing   Problem: Elimination: Goal: Will not experience complications related to bowel motility Outcome: Progressing Goal: Will not experience complications related to urinary retention Outcome: Progressing   Problem: Pain Managment: Goal: General experience of comfort will improve and/or be controlled Outcome: Progressing   Problem: Safety: Goal: Ability to remain free from injury will improve Outcome: Progressing   Problem: Respiratory: Goal: Ability to maintain a clear airway will improve Outcome: Progressing

## 2023-12-04 NOTE — Progress Notes (Signed)
 Mobility Specialist - Progress Note   12/04/23 1022  Mobility  Activity Ambulated with assistance in hallway;Moved into chair position in bed  Level of Assistance Standby assist, set-up cues, supervision of patient - no hands on  Assistive Device Front wheel walker  Distance Ambulated (ft) 160 ft  Activity Response Tolerated well  Mobility visit 1 Mobility  Mobility Specialist Start Time (ACUTE ONLY) 0941  Mobility Specialist Stop Time (ACUTE ONLY) 0955  Mobility Specialist Time Calculation (min) (ACUTE ONLY) 14 min   Pt supine upon entry, utilizing RA. Pt motivated and agreeable to OOB amb this date. Pt completed bed mob ModI, STS to RW from elevated bed MinA and amb one lap around the NS with sup-- demonstrates good safety awareness, as she is able to avoid/stop for passer by. Pt returned to the room, left in bed in chair position with alarm set and needs within reach--- tray setup for breakfast.  Zetta Bills Mobility Specialist 12/04/23 10:25 AM

## 2023-12-04 NOTE — Progress Notes (Signed)
  PROGRESS NOTE    Tracy Dickerson  BJY:[id number] DOB: Feb 10, 1941 DOA: 09/04/2023 PCP: Dwana Melena, PA  205A/205A-AA  LOS: 13 days   Brief hospital course:   Assessment & Plan: Tracy Dickerson is a 83 y.o. female with medical history significant of hypertension, hyperlipidemia, diabetes mellitus, GERD, breast cancer, CKD-3a, septic shock due to UTI, PAF on Eliquis, orthostatic hypotension on Cortef, presented with SOB   Patient has been in the ED for extended period of time since 09/04/23 due to agitation. Patient was evaluated by psychiatry, waiting for placement.  Per ED physician, morning of 11/21/23, patient appeared to be, weaker, developed cough, shortness of breath mild chest pain, found to have oxygen desaturation to 87% on room air, which improved to 95% on 2 L oxygen.  This is new oxygen requirement.  Pt therefore was admitted to the hospitalist service.   She is treated for atypical pneumonia.  Condition has improved.  Currently pending nursing home placement.   Acute hypoxemic respiratory failure 2/2 Atypical pneumonia, ILD (interstitial lung disease) and COPD (chronic obstructive pulmonary disease) (HCC):  Patient had 2 L new oxygen requirement.  No leukocytosis or fever.  Clinically not septic. --started on IV Rocephin and azithromycin and Solu-Medrol 80 daily.  Steroid d/c'ed. --on room air by 3/1 Antibiotics completed.  Patient improved.  Off oxygen.   HTN (hypertension):  Blood pressure not significant elevated.   HLD (hyperlipidemia) --cont statin   PAF (paroxysmal atrial fibrillation) (HCC):  --cont Eliquis   CKD stage 3a, GFR 45-59 ml/min [facility]): Renal function stable.   Orthostatic hypotension --cont Cortef   Lung nodule:  This is incidental findings by CT. -Need to follow-up with PCP as outpatient   Dementia with behavioral disturbance (HCC) Insomnia. -Fall precaution -Haldol prn for agitation Patient has not been sleeping well since placed on  Seroquel.  Will continue.    DVT prophylaxis: [id number] Code Status: Full code  Family Communication:  Level of care: Telemetry Medical Dispo:   The patient is from: home Anticipated d/c is to: to be determined Anticipated d/c date is: whenever disposition found   Subjective and Interval History:  Pt reported sleeping well at night, ate well, walked every day.   Objective: Vitals:   12/03/23 1922 12/04/23 0448 12/04/23 0819 12/04/23 1451  BP: (!) 150/66 (!) 127/58 132/71 118/73  Pulse: 76 67 67 97  Resp: 16 16 16 16   Temp: 98.3 F (36.8 C) 97.8 F (36.6 C) 98.2 F (36.8 C) 97.8 F (36.6 C)  TempSrc:   Oral Oral  SpO2: 95% 94% 95% 97%  Weight:      Height:        Intake/Output Summary (Last 24 hours) at 12/04/2023 1930 Last data filed at 12/04/2023 1057 Gross per 24 hour  Intake 0 ml  Output --  Net 0 ml   Filed Weights   09/04/23 1035 11/28/23 2010  Weight: 90.7 kg 90.7 kg    Examination:   Constitutional: NAD, alert HEENT: conjunctivae and lids normal, EOMI CV: No cyanosis.   RESP: normal respiratory effort, on RA Neuro: II - XII grossly intact.     Data Reviewed: I have personally reviewed labs and imaging studies  Time spent: 25 minutes  Darlin Priestly, MD [facility] If 7PM-7AM, please contact night-coverage 12/04/2023, 7:30 PM

## 2023-12-05 DIAGNOSIS — J189 Pneumonia, unspecified organism: Secondary | ICD-10-CM | POA: Diagnosis not present

## 2023-12-05 NOTE — Progress Notes (Signed)
  PROGRESS NOTE    AIDYNN POLENDO  WUJ:[id number] DOB: 31-Mar-1941 DOA: 09/04/2023 PCP: Dwana Melena, PA  219A/219A-AA  LOS: 14 days   Brief hospital course:   Assessment & Plan: SHEMEKIA PATANE is a 83 y.o. female with medical history significant of hypertension, hyperlipidemia, diabetes mellitus, GERD, breast cancer, CKD-3a, septic shock due to UTI, PAF on Eliquis, orthostatic hypotension on Cortef, presented with SOB   Patient has been in the ED for extended period of time since 09/04/23 due to agitation. Patient was evaluated by psychiatry, waiting for placement.  Per ED physician, morning of 11/21/23, patient appeared to be, weaker, developed cough, shortness of breath mild chest pain, found to have oxygen desaturation to 87% on room air, which improved to 95% on 2 L oxygen.  This is new oxygen requirement.  Pt therefore was admitted to the hospitalist service.   She is treated for atypical pneumonia.  Condition has improved.  Currently pending nursing home placement.   Acute hypoxemic respiratory failure 2/2 Atypical pneumonia, ILD (interstitial lung disease) and COPD (chronic obstructive pulmonary disease) (HCC):  Patient had 2 L new oxygen requirement.  No leukocytosis or fever.  Clinically not septic. --started on IV Rocephin and azithromycin and Solu-Medrol 80 daily.  Steroid d/c'ed. --on room air by 3/1 Antibiotics completed.  Patient improved.  Off oxygen.   HTN (hypertension):  Blood pressure not significant elevated.   HLD (hyperlipidemia) --cont statin   PAF (paroxysmal atrial fibrillation) (HCC):  --cont Eliquis   CKD stage 3a, GFR 45-59 ml/min [facility]): Renal function stable.   Orthostatic hypotension --cont Cortef   Lung nodule:  This is incidental findings by CT. -Need to follow-up with PCP as outpatient   Dementia with behavioral disturbance (HCC) Insomnia. -Fall precaution -Haldol prn for agitation Patient has not been sleeping well since placed on  Seroquel.  Will continue.    DVT prophylaxis: [id number] Code Status: Full code  Family Communication:  Level of care: Telemetry Medical Dispo:   The patient is from: home Anticipated d/c is to: to be determined Anticipated d/c date is: whenever disposition found   Subjective and Interval History:  No new event today.   Objective: Vitals:   12/05/23 0524 12/05/23 0742 12/05/23 1224 12/05/23 1537  BP: 133/73 139/76 121/62 130/79  Pulse: (!) 58 63 68 73  Resp: 20 17 18 16   Temp: (!) 97.5 F (36.4 C) 97.9 F (36.6 C) 97.6 F (36.4 C) 98.2 F (36.8 C)  TempSrc: Oral Oral Oral Oral  SpO2: 97% 97% 100% 99%  Weight:      Height:        Intake/Output Summary (Last 24 hours) at 12/05/2023 1921 Last data filed at 12/05/2023 1500 Gross per 24 hour  Intake 240 ml  Output --  Net 240 ml   Filed Weights   09/04/23 1035 11/28/23 2010  Weight: 90.7 kg 90.7 kg    Examination:   Constitutional: NAD CV: No cyanosis.   RESP: normal respiratory effort, on RA   Data Reviewed: I have personally reviewed labs and imaging studies  Time spent: 25 minutes  Darlin Priestly, MD [facility] If 7PM-7AM, please contact night-coverage 12/05/2023, 7:21 PM

## 2023-12-05 NOTE — Progress Notes (Signed)
 Mobility Specialist - Progress Note   12/05/23 1420  Mobility  Activity Ambulated independently in hallway  Level of Assistance Modified independent, requires aide device or extra time  Assistive Device Front wheel walker  Distance Ambulated (ft) 320 ft  Activity Response Tolerated well  Mobility visit 1 Mobility  Mobility Specialist Start Time (ACUTE ONLY) 1117  Mobility Specialist Stop Time (ACUTE ONLY) 1134  Mobility Specialist Time Calculation (min) (ACUTE ONLY) 17 min   Pt amb two laps around the NS ModI, tolerated well. Pt returned to the room, left in bed in chair position with alarm set and needs within reach.  Zetta Bills Mobility Specialist 12/05/23 2:23 PM

## 2023-12-05 NOTE — Plan of Care (Signed)

## 2023-12-06 DIAGNOSIS — J189 Pneumonia, unspecified organism: Secondary | ICD-10-CM | POA: Diagnosis not present

## 2023-12-06 NOTE — Progress Notes (Signed)
  PROGRESS NOTE    KRIS BURD  ZOX:[id number] DOB: 05/10/1941 DOA: 09/04/2023 PCP: Dwana Melena, PA  219A/219A-AA  LOS: 15 days   Brief hospital course:   Assessment & Plan: Tracy Dickerson is a 83 y.o. female with medical history significant of hypertension, hyperlipidemia, diabetes mellitus, GERD, breast cancer, CKD-3a, septic shock due to UTI, PAF on Eliquis, orthostatic hypotension on Cortef, presented with SOB   Patient has been in the ED for extended period of time since 09/04/23 due to agitation. Patient was evaluated by psychiatry, waiting for placement.  Per ED physician, morning of 11/21/23, patient appeared to be, weaker, developed cough, shortness of breath mild chest pain, found to have oxygen desaturation to 87% on room air, which improved to 95% on 2 L oxygen.  This is new oxygen requirement.  Pt therefore was admitted to the hospitalist service.   She is treated for atypical pneumonia.  Condition has improved.  Currently pending nursing home placement.   Acute hypoxemic respiratory failure 2/2 Atypical pneumonia, ILD (interstitial lung disease) and COPD (chronic obstructive pulmonary disease) (HCC):  Patient had 2 L new oxygen requirement.  No leukocytosis or fever.  Clinically not septic. --started on IV Rocephin and azithromycin and Solu-Medrol 80 daily.  Steroid d/c'ed. --on room air by 3/1 Antibiotics completed.  Patient improved.  Off oxygen.   HTN (hypertension):  Blood pressure not significant elevated.   HLD (hyperlipidemia) --cont statin   PAF (paroxysmal atrial fibrillation) (HCC):  --cont Eliquis   CKD stage 3a, GFR 45-59 ml/min [facility]): Renal function stable.   Orthostatic hypotension --cont Cortef   Lung nodule:  This is incidental findings by CT. -Need to follow-up with PCP as outpatient   Dementia with behavioral disturbance (HCC) Insomnia. -Fall precaution -Haldol prn for agitation Patient has not been sleeping well since placed on  Seroquel.  Will continue.    DVT prophylaxis: [id number] Code Status: Full code  Family Communication:  Level of care: Telemetry Medical Dispo:   The patient is from: home Anticipated d/c is to: to be determined Anticipated d/c date is: whenever disposition found   Subjective and Interval History:  Pt said she was doing ok.   Objective: Vitals:   12/05/23 2045 12/06/23 0333 12/06/23 0937 12/06/23 1535  BP: 123/62 (!) 115/51 138/62 135/66  Pulse: 81 62 64 63  Resp: 20 20 16 18   Temp: 97.6 F (36.4 C) (!) 97.5 F (36.4 C) (!) 97.5 F (36.4 C) 98.2 F (36.8 C)  TempSrc: Oral Oral Oral   SpO2: 98% 99% 99% 100%  Weight:      Height:        Intake/Output Summary (Last 24 hours) at 12/06/2023 1649 Last data filed at 12/06/2023 1500 Gross per 24 hour  Intake 240 ml  Output --  Net 240 ml   Filed Weights   09/04/23 1035 11/28/23 2010  Weight: 90.7 kg 90.7 kg    Examination:   Constitutional: NAD, alert HEENT: conjunctivae and lids normal, EOMI CV: No cyanosis.   RESP: normal respiratory effort, on RA Neuro: II - XII grossly intact.     Data Reviewed: I have personally reviewed labs and imaging studies  Time spent: 25 minutes  Darlin Priestly, MD [facility] If 7PM-7AM, please contact night-coverage 12/06/2023, 4:49 PM

## 2023-12-06 NOTE — Plan of Care (Signed)

## 2023-12-07 DIAGNOSIS — J189 Pneumonia, unspecified organism: Secondary | ICD-10-CM | POA: Diagnosis not present

## 2023-12-07 NOTE — Progress Notes (Signed)
  PROGRESS NOTE    Tracy Dickerson  ZOX:[id number] DOB: 06-10-1941 DOA: 09/04/2023 PCP: Dwana Melena, PA  219A/219A-AA  LOS: 16 days   Brief hospital course:   Assessment & Plan: Tracy Dickerson is a 83 y.o. female with medical history significant of hypertension, hyperlipidemia, diabetes mellitus, GERD, breast cancer, CKD-3a, septic shock due to UTI, PAF on Eliquis, orthostatic hypotension on Cortef, presented with SOB   Patient has been in the ED for extended period of time since 09/04/23 due to agitation. Patient was evaluated by psychiatry, waiting for placement.  Per ED physician, morning of 11/21/23, patient appeared to be, weaker, developed cough, shortness of breath mild chest pain, found to have oxygen desaturation to 87% on room air, which improved to 95% on 2 L oxygen.  This is new oxygen requirement.  Pt therefore was admitted to the hospitalist service.   She is treated for atypical pneumonia.  Condition has improved.  Currently pending nursing home placement.   Acute hypoxemic respiratory failure 2/2 Atypical pneumonia, ILD (interstitial lung disease) and COPD (chronic obstructive pulmonary disease) (HCC):  Patient had 2 L new oxygen requirement.  No leukocytosis or fever.  Clinically not septic. --started on IV Rocephin and azithromycin and Solu-Medrol 80 daily.  Steroid d/c'ed. --on room air by 3/1 Antibiotics completed.  Patient improved.  Off oxygen.   HTN (hypertension):  Blood pressure not significant elevated.   HLD (hyperlipidemia) --cont statin   PAF (paroxysmal atrial fibrillation) (HCC):  --cont Eliquis   CKD stage 3a, GFR 45-59 ml/min [facility]): Renal function stable.   Orthostatic hypotension --cont Cortef   Lung nodule:  This is incidental findings by CT. -Need to follow-up with PCP as outpatient   Dementia with behavioral disturbance (HCC) Insomnia. -Fall precaution -Haldol prn for agitation Patient has not been sleeping well since placed on  Seroquel.  Will continue.    DVT prophylaxis: [id number] Code Status: Full code  Family Communication:  Level of care: Telemetry Medical Dispo:   The patient is from: home Anticipated d/c is to: to be determined Anticipated d/c date is: whenever disposition found   Subjective and Interval History:  No new event today.   Objective: Vitals:   12/06/23 0937 12/06/23 1535 12/06/23 1952 12/07/23 0331  BP: 138/62 135/66 (!) 150/66 133/63  Pulse: 64 63 70 62  Resp: 16 18 16 16   Temp: (!) 97.5 F (36.4 C) 98.2 F (36.8 C) 98.5 F (36.9 C) (!) 97.2 F (36.2 C)  TempSrc: Oral  Axillary Axillary  SpO2: 99% 100% 97% 97%  Weight:      Height:        Intake/Output Summary (Last 24 hours) at 12/07/2023 1535 Last data filed at 12/06/2023 1900 Gross per 24 hour  Intake 240 ml  Output --  Net 240 ml   Filed Weights   09/04/23 1035 11/28/23 2010  Weight: 90.7 kg 90.7 kg    Examination:   Constitutional: NAD, alert HEENT: conjunctivae and lids normal, EOMI CV: No cyanosis.   RESP: normal respiratory effort, on RA   Data Reviewed: I have personally reviewed labs and imaging studies  Time spent: 25 minutes  Darlin Priestly, MD [facility] If 7PM-7AM, please contact night-coverage 12/07/2023, 3:35 PM

## 2023-12-07 NOTE — Plan of Care (Signed)

## 2023-12-07 NOTE — Progress Notes (Signed)
 Mobility Specialist - Progress Note   12/07/23 1231  Mobility  Activity Ambulated with assistance in hallway;Stood at bedside;Dangled on edge of bed  Level of Assistance Standby assist, set-up cues, supervision of patient - no hands on  Assistive Device Front wheel walker  Distance Ambulated (ft) 320 ft  Activity Response Tolerated well  Mobility Referral Yes  Mobility visit 1 Mobility  Mobility Specialist Start Time (ACUTE ONLY) 1118  Mobility Specialist Stop Time (ACUTE ONLY) 1134  Mobility Specialist Time Calculation (min) (ACUTE ONLY) 16 min   Pt EOB on RA upon arrival. Pt dons shoes, STS, and ambulates in hallway Supervision. Pt returns to EOB with needs in reach.   Terrilyn Saver  Mobility Specialist  12/07/23 12:32 PM

## 2023-12-08 DIAGNOSIS — J189 Pneumonia, unspecified organism: Secondary | ICD-10-CM | POA: Diagnosis not present

## 2023-12-08 NOTE — Progress Notes (Signed)
  PROGRESS NOTE    MENDI CONSTABLE  ZOX:[id number] DOB: 1941/09/10 DOA: 09/04/2023 PCP: Dwana Melena, PA  219A/219A-AA  LOS: 17 days   Brief hospital course:   Assessment & Plan: Tracy Dickerson is a 83 y.o. female with medical history significant of hypertension, hyperlipidemia, diabetes mellitus, GERD, breast cancer, CKD-3a, septic shock due to UTI, PAF on Eliquis, orthostatic hypotension on Cortef, presented with SOB   Patient has been in the ED for extended period of time since 09/04/23 due to agitation. Patient was evaluated by psychiatry, waiting for placement.  Per ED physician, morning of 11/21/23, patient appeared to be, weaker, developed cough, shortness of breath mild chest pain, found to have oxygen desaturation to 87% on room air, which improved to 95% on 2 L oxygen.  This is new oxygen requirement.  Pt therefore was admitted to the hospitalist service.   She is treated for atypical pneumonia.  Condition has improved.  Currently pending nursing home placement.   Acute hypoxemic respiratory failure 2/2 Atypical pneumonia, ILD (interstitial lung disease) and COPD (chronic obstructive pulmonary disease) (HCC):  Patient had 2 L new oxygen requirement.  No leukocytosis or fever.  Clinically not septic. --started on IV Rocephin and azithromycin and Solu-Medrol 80 daily.  Steroid d/c'ed. --on room air by 3/1 Antibiotics completed.  Patient improved.  Off oxygen.   HTN (hypertension):  Blood pressure not significant elevated.   HLD (hyperlipidemia) --cont statin   PAF (paroxysmal atrial fibrillation) (HCC):  --cont Eliquis   CKD stage 3a, GFR 45-59 ml/min [facility]): Renal function stable.   Orthostatic hypotension --cont Cortef   Lung nodule:  This is incidental findings by CT. -Need to follow-up with PCP as outpatient   Dementia with behavioral disturbance (HCC) Insomnia. -Fall precaution -Haldol prn for agitation Patient has not been sleeping well since placed on  Seroquel.  Will continue.    DVT prophylaxis: [id number] Code Status: Full code  Family Communication:  Level of care: Telemetry Medical Dispo:   The patient is from: home Anticipated d/c is to: to be determined Anticipated d/c date is: whenever disposition found   Subjective and Interval History:  Pt was motivated for her daily walks.  Pt said she was doing ok.   Objective: Vitals:   12/07/23 1951 12/08/23 0348 12/08/23 0807 12/08/23 1602  BP: (!) 114/54 126/65 (!) 140/78 133/65  Pulse: 80 63 (!) 48 66  Resp: 20 16 18 18   Temp: 98 F (36.7 C) (!) 97.5 F (36.4 C) 97.9 F (36.6 C) 97.7 F (36.5 C)  TempSrc:  Oral  Oral  SpO2: 97% 95% 100% 100%  Weight:      Height:        Intake/Output Summary (Last 24 hours) at 12/08/2023 1802 Last data filed at 12/08/2023 1100 Gross per 24 hour  Intake 480 ml  Output --  Net 480 ml   Filed Weights   09/04/23 1035 11/28/23 2010  Weight: 90.7 kg 90.7 kg    Examination:   Constitutional: NAD, alert HEENT: conjunctivae and lids normal, EOMI CV: No cyanosis.   RESP: normal respiratory effort, on RA Neuro: II - XII grossly intact.     Data Reviewed: I have personally reviewed labs and imaging studies  Time spent: 25 minutes  Darlin Priestly, MD [facility] If 7PM-7AM, please contact night-coverage 12/08/2023, 6:02 PM

## 2023-12-08 NOTE — Plan of Care (Signed)

## 2023-12-08 NOTE — Progress Notes (Signed)
 Mobility Specialist - Progress Note   12/08/23 1044  Mobility  Activity Ambulated independently in hallway  Level of Assistance Modified independent, requires aide device or extra time  Assistive Device Front wheel walker  Distance Ambulated (ft) 1000 ft  Activity Response Tolerated well  Mobility visit 1 Mobility  Mobility Specialist Start Time (ACUTE ONLY) 1028  Mobility Specialist Stop Time (ACUTE ONLY) 1042  Mobility Specialist Time Calculation (min) (ACUTE ONLY) 14 min   Pt supine upon entry, utilizing RA. Pt motivated and agreeable to OOB amb this date. Pt completed bed mob indep, STS to RW and amb ~1000 ft around the NS and within the hallway to the elevator ModI-- steady pace, good safety awareness. Pt returned to the room, left seated in the recliner with alarm set and needs within reach.   Zetta Bills Mobility Specialist 12/08/23 10:47 AM

## 2023-12-09 DIAGNOSIS — J189 Pneumonia, unspecified organism: Secondary | ICD-10-CM | POA: Diagnosis not present

## 2023-12-09 NOTE — Progress Notes (Signed)
  PROGRESS NOTE    Tracy Dickerson  ZOX:[id number] DOB: 05/03/1941 DOA: 09/04/2023 PCP: Dwana Melena, PA  219A/219A-AA  LOS: 18 days   Brief hospital course:   Assessment & Plan: Tracy Dickerson is a 83 y.o. female with medical history significant of hypertension, hyperlipidemia, diabetes mellitus, GERD, breast cancer, CKD-3a, septic shock due to UTI, PAF on Eliquis, orthostatic hypotension on Cortef, presented with SOB   Patient has been in the ED for extended period of time since 09/04/23 due to agitation. Patient was evaluated by psychiatry, waiting for placement.  Per ED physician, morning of 11/21/23, patient appeared to be, weaker, developed cough, shortness of breath mild chest pain, found to have oxygen desaturation to 87% on room air, which improved to 95% on 2 L oxygen.  This is new oxygen requirement.  Pt therefore was admitted to the hospitalist service.   She is treated for atypical pneumonia.  Condition has improved.  Currently pending nursing home placement.   Acute hypoxemic respiratory failure 2/2 Atypical pneumonia, ILD (interstitial lung disease) and COPD (chronic obstructive pulmonary disease) (HCC):  Patient had 2 L new oxygen requirement.  No leukocytosis or fever.  Clinically not septic. --started on IV Rocephin and azithromycin and Solu-Medrol 80 daily.  Steroid d/c'ed. --on room air by 3/1 Antibiotics completed.  Patient improved.  Off oxygen.   HTN (hypertension):  Blood pressure not significant elevated.   HLD (hyperlipidemia) --cont statin   PAF (paroxysmal atrial fibrillation) (HCC):  --cont Eliquis   CKD stage 3a, GFR 45-59 ml/min [facility]): Renal function stable.   Orthostatic hypotension --cont Cortef   Lung nodule:  This is incidental findings by CT. -Need to follow-up with PCP as outpatient   Dementia with behavioral disturbance (HCC) Insomnia. -Fall precaution -Haldol prn for agitation Patient has not been sleeping well since placed on  Seroquel.  Will continue.    DVT prophylaxis: [id number] Code Status: Full code  Family Communication:  Level of care: Telemetry Medical Dispo:   The patient is from: home Anticipated d/c is to: to be determined Anticipated d/c date is: whenever disposition found   Subjective and Interval History:  Pt walked and had hamberger for lunch.     Objective: Vitals:   12/09/23 0217 12/09/23 0607 12/09/23 0851 12/09/23 1443  BP: (!) 99/54 101/62 (!) 113/35 (!) 127/55  Pulse: 65 60 62 69  Resp: 18 17 18 18   Temp: 97.7 F (36.5 C)  97.8 F (36.6 C) 98 F (36.7 C)  TempSrc:      SpO2: 94%  99% 100%  Weight:      Height:        Intake/Output Summary (Last 24 hours) at 12/09/2023 1845 Last data filed at 12/09/2023 1500 Gross per 24 hour  Intake 180 ml  Output --  Net 180 ml   Filed Weights   09/04/23 1035 11/28/23 2010  Weight: 90.7 kg 90.7 kg    Examination:   Constitutional: NAD, alert, oriented to person and place HEENT: conjunctivae and lids normal, EOMI CV: No cyanosis.   RESP: normal respiratory effort, on RA Neuro: II - XII grossly intact.     Data Reviewed: I have personally reviewed labs and imaging studies  Time spent: 25 minutes  Darlin Priestly, MD [facility] If 7PM-7AM, please contact night-coverage 12/09/2023, 6:45 PM

## 2023-12-09 NOTE — Progress Notes (Signed)
 Mobility Specialist - Progress Note    12/09/23 1140  Mobility  Activity Ambulated independently in hallway  Level of Assistance Modified independent, requires aide device or extra time  Assistive Device Front wheel walker  Distance Ambulated (ft) 160 ft  Activity Response Tolerated well  Mobility visit 1 Mobility  Mobility Specialist Start Time (ACUTE ONLY) 1023  Mobility Specialist Stop Time (ACUTE ONLY) 1034  Mobility Specialist Time Calculation (min) (ACUTE ONLY) 11 min   Pt sitting in the recliner upon entry, utilizing RA. Pt agreeable to amb within the hallway this date. Pt STS to RW and amb one lap around the NS ModI. Pt returned to the room, left seated in the recliner with alarm set and needs within reach.  Zetta Bills Mobility Specialist 12/09/23 11:44 AM

## 2023-12-09 NOTE — Plan of Care (Signed)

## 2023-12-10 DIAGNOSIS — J189 Pneumonia, unspecified organism: Secondary | ICD-10-CM | POA: Diagnosis not present

## 2023-12-10 NOTE — Progress Notes (Signed)
  PROGRESS NOTE    Tracy Dickerson  [id number]:[id number] DOB: 09/16/1941 DOA: 09/04/2023 PCP: Dwana Melena, PA  219A/219A-AA  LOS: 19 days   Brief hospital course:   Assessment & Plan: Tracy Dickerson is a 83 y.o. female with medical history significant of hypertension, hyperlipidemia, diabetes mellitus, GERD, breast cancer, CKD-3a, septic shock due to UTI, PAF on Eliquis, orthostatic hypotension on Cortef, presented with SOB   Patient has been in the ED for extended period of time since 09/04/23 due to agitation. Patient was evaluated by psychiatry, waiting for placement.  Per ED physician, morning of 11/21/23, patient appeared to be, weaker, developed cough, shortness of breath mild chest pain, found to have oxygen desaturation to 87% on room air, which improved to 95% on 2 L oxygen.  This is new oxygen requirement.  Pt therefore was admitted to the hospitalist service.   She is treated for atypical pneumonia.  Condition has improved.  Currently pending nursing home placement.   Acute hypoxemic respiratory failure 2/2 Atypical pneumonia, ILD (interstitial lung disease) and COPD (chronic obstructive pulmonary disease) (HCC):  Patient had 2 L new oxygen requirement.  No leukocytosis or fever.  Clinically not septic. --started on IV Rocephin and azithromycin and Solu-Medrol 80 daily.  Steroid d/c'ed. --on room air by 3/1 Antibiotics completed.  Patient improved.  Off oxygen.   HTN (hypertension):  Blood pressure not significant elevated.   HLD (hyperlipidemia) --cont statin   PAF (paroxysmal atrial fibrillation) (HCC):  --cont Eliquis   CKD stage 3a, GFR 45-59 ml/min [facility]): Renal function stable.   Orthostatic hypotension --cont Cortef   Lung nodule:  This is incidental findings by CT. -Need to follow-up with PCP as outpatient   Dementia with behavioral disturbance (HCC) Insomnia. -Fall precaution -Haldol prn for agitation Patient has not been sleeping well since placed on  Seroquel.  Will continue.    DVT prophylaxis: [id number] Code Status: Full code  Family Communication:  Level of care: Telemetry Medical Dispo:   The patient is from: home Anticipated d/c is to: to be determined Anticipated d/c date is: whenever disposition found   Subjective and Interval History:  No even today.   Objective: Vitals:   12/09/23 2152 12/10/23 0200 12/10/23 0714 12/10/23 1505  BP: 115/61 (!) 101/42 (!) 140/92 116/75  Pulse: 79 68 69 71  Resp: 18 18 17 17   Temp: 97.7 F (36.5 C) (!) 97.5 F (36.4 C) 98 F (36.7 C) 97.7 F (36.5 C)  TempSrc: Oral Oral  Oral  SpO2: 95% 93% 96% 99%  Weight:      Height:        Intake/Output Summary (Last 24 hours) at 12/10/2023 2003 Last data filed at 12/10/2023 1423 Gross per 24 hour  Intake 0 ml  Output --  Net 0 ml   Filed Weights   09/04/23 1035 11/28/23 2010  Weight: 90.7 kg 90.7 kg    Examination:   Constitutional: NAD HEENT: conjunctivae and lids normal, EOMI CV: No cyanosis.   RESP: normal respiratory effort, on RA   Data Reviewed: I have personally reviewed labs and imaging studies  Time spent: 25 minutes  Darlin Priestly, MD [facility] If 7PM-7AM, please contact night-coverage 12/10/2023, 8:03 PM

## 2023-12-10 NOTE — Progress Notes (Signed)
 Mobility Specialist - Progress Note   12/10/23 1034  Mobility  Activity Ambulated independently in hallway  Level of Assistance Modified independent, requires aide device or extra time  Assistive Device Front wheel walker  Distance Ambulated (ft) 20 ft  Activity Response Tolerated well  Mobility visit 1 Mobility  Mobility Specialist Start Time (ACUTE ONLY) 0955  Mobility Specialist Stop Time (ACUTE ONLY) 1030  Mobility Specialist Time Calculation (min) (ACUTE ONLY) 35 min   Pt sitting in the recliner upon entry, utilizing RA. Pt motivated and agreeable to OOB amb this date. Pt STS to RW and amb to the w/c ModI. Pt wheeled outside for leisure, sat for ~10 mins. Pt returned to the room, left seated in the recliner with alarm set and needs within reach. RN notified of Pt's return to the room.  Zetta Bills Mobility Specialist 12/10/23 10:40 AM

## 2023-12-10 NOTE — Plan of Care (Signed)
 Received report, lying in bed,denies pain. Reoriented to day time, place. Known name and DOB. Assessment completed. Bed alarm engaged. Reminded not to get our of bed alone. 0850 Bed alarm, engaged. Forgetful, Found in BR with walker. Reminded to call for help when using the BR, reports will call. Continue to frequent monitor.

## 2023-12-11 DIAGNOSIS — J189 Pneumonia, unspecified organism: Secondary | ICD-10-CM | POA: Diagnosis not present

## 2023-12-11 NOTE — Progress Notes (Signed)
 Mobility Specialist - Progress Note   12/11/23 1100  Mobility  Activity Ambulated independently in hallway  Level of Assistance Modified independent, requires aide device or extra time  Assistive Device Front wheel walker  Distance Ambulated (ft) 300 ft  Activity Response Tolerated well  Mobility visit 1 Mobility  Mobility Specialist Start Time (ACUTE ONLY) 1034  Mobility Specialist Stop Time (ACUTE ONLY) 1046  Mobility Specialist Time Calculation (min) (ACUTE ONLY) 12 min   Pt supine upon entry, utilizing RA. Pt motivated and agreeable to OOB amb within the hallway this date. Pt amb ~300 ft in the hallway ModI, demonstrating good safety awareness as she stops for dinning carts as they pull out. Pt  returned to the room, left seated in the recliner with alarm set and needs within reach.  Zetta Bills Mobility Specialist 12/11/23 11:12 AM

## 2023-12-11 NOTE — Plan of Care (Signed)

## 2023-12-11 NOTE — Progress Notes (Signed)
  PROGRESS NOTE    Tracy Dickerson  ZOX:[id number] DOB: [birth year] DOA: 09/04/2023 PCP: Dwana Melena, PA  219A/219A-AA  LOS: 20 days   Brief hospital course:   Assessment & Plan: Tracy Dickerson is a 83 y.o. female with medical history significant of hypertension, hyperlipidemia, diabetes mellitus, GERD, breast cancer, CKD-3a, septic shock due to UTI, PAF on Eliquis, orthostatic hypotension on Cortef, presented with SOB   Patient has been in the ED for extended period of time since 09/04/23 due to agitation. Patient was evaluated by psychiatry, waiting for placement.  Per ED physician, morning of 11/21/23, patient appeared to be, weaker, developed cough, shortness of breath mild chest pain, found to have oxygen desaturation to 87% on room air, which improved to 95% on 2 L oxygen.  This is new oxygen requirement.  Pt therefore was admitted to the hospitalist service.   She is treated for atypical pneumonia.  Condition has improved.  Currently pending nursing home placement.   Acute hypoxemic respiratory failure 2/2 Atypical pneumonia, ILD (interstitial lung disease) and COPD (chronic obstructive pulmonary disease) (HCC):  Patient had 2 L new oxygen requirement.  No leukocytosis or fever.  Clinically not septic. --started on IV Rocephin and azithromycin and Solu-Medrol 80 daily.  Steroid d/c'ed. --on room air by 3/1 Antibiotics completed.  Patient improved.  Off oxygen.   HTN (hypertension):  Blood pressure not significant elevated.   HLD (hyperlipidemia) --cont statin   PAF (paroxysmal atrial fibrillation) (HCC):  --cont Eliquis   CKD stage 3a, GFR 45-59 ml/min [facility]): Renal function stable.   Orthostatic hypotension --cont Cortef   Lung nodule:  This is incidental findings by CT. -Need to follow-up with PCP as outpatient   Dementia with behavioral disturbance (HCC) Insomnia. -Fall precaution -Haldol prn for agitation Patient has not been sleeping well since placed on  Seroquel.  Will continue.    DVT prophylaxis: [id number] Code Status: Full code  Family Communication:  Level of care: Telemetry Medical Dispo:   The patient is from: home Anticipated d/c is to: to be determined Anticipated d/c date is: whenever disposition found   Subjective and Interval History:  No new complaint.   Objective: Vitals:   12/11/23 0828 12/11/23 1507 12/11/23 1524 12/11/23 2001  BP: 116/67 109/67 (!) 117/55 (!) 119/53  Pulse: 61 75 73 98  Resp: 16 16 17 19   Temp: (!) 97.4 F (36.3 C) (!) 97.5 F (36.4 C) (!) 97.5 F (36.4 C) 97.8 F (36.6 C)  TempSrc: Oral Oral Oral Oral  SpO2: 96% 99% 99% 96%  Weight:      Height:       No intake or output data in the 24 hours ending 12/11/23 2111  Filed Weights   09/04/23 1035 11/28/23 2010  Weight: 90.7 kg 90.7 kg    Examination:   Constitutional: NAD, alert oriented to person and place HEENT: conjunctivae and lids normal, EOMI CV: No cyanosis.   RESP: normal respiratory effort, on RA Neuro: II - XII grossly intact.     Data Reviewed: I have personally reviewed labs and imaging studies  Time spent: 25 minutes  Darlin Priestly, MD [facility] If 7PM-7AM, please contact night-coverage 12/11/2023, 9:11 PM

## 2023-12-12 DIAGNOSIS — J189 Pneumonia, unspecified organism: Secondary | ICD-10-CM | POA: Diagnosis not present

## 2023-12-12 LAB — CBC
HCT: 31.6 % — ABNORMAL LOW (ref 36.0–46.0)
Hemoglobin: 10.6 g/dL — ABNORMAL LOW (ref 12.0–15.0)
MCH: 30.5 pg (ref 26.0–34.0)
MCHC: 33.5 g/dL (ref 30.0–36.0)
MCV: 91.1 fL (ref 80.0–100.0)
Platelets: 156 10*3/uL (ref 150–400)
RBC: 3.47 MIL/uL — ABNORMAL LOW (ref 3.87–5.11)
RDW: 14.8 % (ref 11.5–15.5)
WBC: 5.6 10*3/uL (ref 4.0–10.5)
nRBC: 0 % (ref 0.0–0.2)

## 2023-12-12 NOTE — Progress Notes (Signed)
  PROGRESS NOTE    Tracy Dickerson  HYQ:[id number] DOB: Sep 13, 1941 DOA: 09/04/2023 PCP: Dwana Melena, PA  219A/219A-AA  LOS: 21 days   Brief hospital course:   Assessment & Plan: Tracy Dickerson is a 82 y.o. female with medical history significant of hypertension, hyperlipidemia, diabetes mellitus, GERD, breast cancer, CKD-3a, septic shock due to UTI, PAF on Eliquis, orthostatic hypotension on Cortef, presented with SOB   Patient has been in the ED for extended period of time since 09/04/23 due to agitation. Patient was evaluated by psychiatry, waiting for placement.  Per ED physician, morning of 11/21/23, patient appeared to be, weaker, developed cough, shortness of breath mild chest pain, found to have oxygen desaturation to 87% on room air, which improved to 95% on 2 L oxygen.  This is new oxygen requirement.  Pt therefore was admitted to the hospitalist service.   She is treated for atypical pneumonia.  Condition has improved.  Currently pending nursing home placement.   Acute hypoxemic respiratory failure 2/2 Atypical pneumonia, ILD (interstitial lung disease) and COPD (chronic obstructive pulmonary disease) (HCC):  Patient had 2 L new oxygen requirement.  No leukocytosis or fever.  Clinically not septic. --started on IV Rocephin and azithromycin and Solu-Medrol 80 daily.  Steroid d/c'ed. --on room air by 3/1 Antibiotics completed.  Patient improved.  Off oxygen.   HTN (hypertension):  Blood pressure not significant elevated.   HLD (hyperlipidemia) --cont statin   PAF (paroxysmal atrial fibrillation) (HCC):  --cont Eliquis   CKD stage 3a, GFR 45-59 ml/min [facility]): Renal function stable.   Orthostatic hypotension --cont Cortef   Lung nodule:  This is incidental findings by CT. -Need to follow-up with PCP as outpatient   Dementia with behavioral disturbance (HCC) Insomnia. -Fall precaution -Haldol prn for agitation Patient has not been sleeping well since placed on  Seroquel.  Will continue.    DVT prophylaxis: [id number] Code Status: Full code  Family Communication:  Level of care: Telemetry Medical Dispo:   The patient is from: home Anticipated d/c is to: to be determined Anticipated d/c date is: whenever disposition found   Subjective and Interval History:  Pt said she enjoys her meals.   Objective: Vitals:   12/12/23 0818 12/12/23 1501 12/12/23 1539 12/12/23 2044  BP: (!) 115/50 129/70 (!) 167/76 130/63  Pulse: 65 68 64 81  Resp: 18 18 18 18   Temp: 98.7 F (37.1 C) (!) 97.4 F (36.3 C)  97.9 F (36.6 C)  TempSrc: Oral Oral  Oral  SpO2: 96% 98% 100% 97%  Weight:      Height:        Intake/Output Summary (Last 24 hours) at 12/12/2023 2120 Last data filed at 12/12/2023 1300 Gross per 24 hour  Intake 460 ml  Output --  Net 460 ml    Filed Weights   09/04/23 1035 11/28/23 2010  Weight: 90.7 kg 90.7 kg    Examination:   Constitutional: NAD, alert, oriented to person and place HEENT: conjunctivae and lids normal, EOMI CV: No cyanosis.   RESP: normal respiratory effort, on RA Neuro: II - XII grossly intact.   Psych: flat mood and affect.     Data Reviewed: I have personally reviewed labs and imaging studies  Time spent: 25 minutes  Darlin Priestly, MD [facility] If 7PM-7AM, please contact night-coverage 12/12/2023, 9:20 PM

## 2023-12-12 NOTE — Progress Notes (Signed)
 Mobility Specialist - Progress Note   12/12/23 1051  Mobility  Activity Ambulated independently in hallway  Level of Assistance Modified independent, requires aide device or extra time  Assistive Device Front wheel walker  Distance Ambulated (ft) 480 ft  Activity Response Tolerated well  Mobility visit 1 Mobility  Mobility Specialist Start Time (ACUTE ONLY) 1035  Mobility Specialist Stop Time (ACUTE ONLY) 1047  Mobility Specialist Time Calculation (min) (ACUTE ONLY) 12 min   Pt sitting in the recliner upon entry, utilizing RA. Pt STS to RW and amb three laps around the NS ModI-- tolerated well. Pt returned to the room, left seated in the recliner with alarm set and needs within reach.  Zetta Bills Mobility Specialist 12/12/23 10:55 AM

## 2023-12-12 NOTE — Plan of Care (Signed)
  Problem: Clinical Measurements: Goal: Will remain free from infection Outcome: Progressing Goal: Cardiovascular complication will be avoided Outcome: Progressing   Problem: Nutrition: Goal: Adequate nutrition will be maintained Outcome: Progressing   Problem: Coping: Goal: Level of anxiety will decrease Outcome: Progressing   Problem: Safety: Goal: Ability to remain free from injury will improve Outcome: Progressing   Problem: Skin Integrity: Goal: Risk for impaired skin integrity will decrease Outcome: Progressing   Problem: Respiratory: Goal: Ability to maintain a clear airway will improve Outcome: Progressing

## 2023-12-12 NOTE — TOC Progression Note (Signed)
 Transition of Care [facility]) - Progression Note    Patient Details  Name: Tracy Dickerson MRN: [id number] Date of Birth: August 05, 1941  [facility] CM/SW Contact  Chapman Fitch, RN Phone Number: 12/12/2023, 4:32 PM  Clinical Narrative:     [address] Thelma Barge has requested that Guardian confirm SS benefits.   As of 3/19 special assistance application was missing signature.  Guardian to complete final requirements in order to proceed with placement at [facility]  Expected Discharge Plan: Long Term Nursing Home Barriers to Discharge: Continued Medical Work up  Expected Discharge Plan and Services In-house Referral: Clinical Social Work Discharge Planning Services: NA Post Acute Care Choice: Skilled Nursing Facility Living arrangements for the past 2 months: Single Family Home (Unsafe to return)                   DME Agency: NA     Representative spoke with at DME Agency: N/A HH Arranged: NA           Social Determinants of Health (SDOH) Interventions SDOH Screenings   Food Insecurity: No Food Insecurity (11/21/2023)  Housing: Low Risk  (11/21/2023)  Transportation Needs: No Transportation Needs (11/21/2023)  Utilities: Not At Risk (11/21/2023)  Financial Resource Strain: Low Risk  (02/04/2023)   Received from [facility], [facility]  Physical Activity: Inactive (07/16/2021)   Received from [facility], [facility]  Social Connections: Patient Unable To Answer (11/21/2023)  Stress: No Stress Concern Present (07/16/2021)   Received from [facility], [facility]  Tobacco Use: Medium Risk (09/04/2023)  Health Literacy: Medium Risk (07/16/2021)   Received from [facility], [facility]    Readmission Risk Interventions     No data to display

## 2023-12-13 DIAGNOSIS — N1831 Chronic kidney disease, stage 3a: Secondary | ICD-10-CM | POA: Diagnosis not present

## 2023-12-13 DIAGNOSIS — J849 Interstitial pulmonary disease, unspecified: Secondary | ICD-10-CM | POA: Diagnosis not present

## 2023-12-13 DIAGNOSIS — J449 Chronic obstructive pulmonary disease, unspecified: Secondary | ICD-10-CM | POA: Diagnosis not present

## 2023-12-13 DIAGNOSIS — I10 Essential (primary) hypertension: Secondary | ICD-10-CM | POA: Diagnosis not present

## 2023-12-13 NOTE — Plan of Care (Signed)
°  Problem: Education: °Goal: Knowledge of General Education information will improve °Description: Including pain rating scale, medication(s)/side effects and non-pharmacologic comfort measures °Outcome: Progressing °  °Problem: Health Behavior/Discharge Planning: °Goal: Ability to manage health-related needs will improve °Outcome: Progressing °  °Problem: Clinical Measurements: °Goal: Ability to maintain clinical measurements within normal limits will improve °Outcome: Progressing °Goal: Respiratory complications will improve °Outcome: Progressing °Goal: Cardiovascular complication will be avoided °Outcome: Progressing °  °Problem: Activity: °Goal: Risk for activity intolerance will decrease °Outcome: Progressing °  °Problem: Nutrition: °Goal: Adequate nutrition will be maintained °Outcome: Progressing °  °

## 2023-12-13 NOTE — Progress Notes (Signed)
 Mobility Specialist - Progress Note   12/13/23 1014  Mobility  Activity Ambulated independently in hallway  Level of Assistance Modified independent, requires aide device or extra time  Assistive Device Front wheel walker  Distance Ambulated (ft) 160 ft  Activity Response Tolerated well  Mobility visit 1 Mobility  Mobility Specialist Start Time (ACUTE ONLY) 0947  Mobility Specialist Stop Time (ACUTE ONLY) 0952  Mobility Specialist Time Calculation (min) (ACUTE ONLY) 5 min   Zetta Bills Mobility Specialist 12/13/23 10:16 AM

## 2023-12-13 NOTE — Progress Notes (Signed)
 Mobility Specialist - Progress Note   12/13/23 1349  Mobility  Activity Ambulated independently in hallway  Level of Assistance Modified independent, requires aide device or extra time  Assistive Device Front wheel walker  Distance Ambulated (ft) 600 ft  Activity Response Tolerated well  Mobility Referral Yes  Mobility visit 1 Mobility  Mobility Specialist Start Time (ACUTE ONLY) 1321  Mobility Specialist Stop Time (ACUTE ONLY) 1337  Mobility Specialist Time Calculation (min) (ACUTE ONLY) 16 min   Pt OOB on RA upon arrival. Pt ambulates down to elevators and back without any physical assistance. Pt returns to recliner with needs in reach.   Terrilyn Saver  Mobility Specialist  12/13/23 1:50 PM

## 2023-12-13 NOTE — Plan of Care (Signed)

## 2023-12-13 NOTE — Progress Notes (Signed)
 PROGRESS NOTE    Tracy Dickerson  [id number]:[id number] DOB: 1940-09-30 DOA: 09/04/2023 PCP: Dwana Melena, PA  219A/219A-AA  LOS: 22 days   Brief hospital course:  Tracy Dickerson is a 83 y.o. female with medical history significant of hypertension, hyperlipidemia, diabetes mellitus, GERD, breast cancer, CKD-3a, septic shock due to UTI, PAF on Eliquis, orthostatic hypotension on Cortef, presented with SOB   Patient has been in the ED for extended period of time since 09/04/23 due to agitation. Patient was evaluated by psychiatry, waiting for placement.  Per ED physician, morning of 11/21/23, patient appeared to be, weaker, developed cough, shortness of breath mild chest pain, found to have oxygen desaturation to 87% on room air, which improved to 95% on 2 L oxygen.  This is new oxygen requirement.  Pt therefore was admitted to the hospitalist service.   She is treated for atypical pneumonia.  Condition has improved.  Currently pending nursing home placement.  Assessment & Plan:   Acute hypoxemic respiratory failure 2/2 Atypical pneumonia, ILD (interstitial lung disease) and COPD (chronic obstructive pulmonary disease) (HCC):  Patient had 2 L new oxygen requirement.  No leukocytosis or fever.  Clinically not septic. --started on IV Rocephin and azithromycin and Solu-Medrol 80 daily.  Steroid d/c'ed. --on room air by 3/1 Antibiotics completed.  Patient improved.  Off oxygen.   HTN (hypertension):  Blood pressure not significant elevated.   HLD (hyperlipidemia) --cont statin   PAF (paroxysmal atrial fibrillation) (HCC):  --cont Eliquis   CKD stage 3a, GFR 45-59 ml/min [facility]): Renal function stable.   Orthostatic hypotension --cont Cortef   Lung nodule:  This is incidental findings by CT. -Need to follow-up with PCP as outpatient   Dementia with behavioral disturbance (HCC) Insomnia. -Fall precaution -Haldol prn for agitation Patient has not been sleeping well since placed on  Seroquel.  Will continue.    DVT prophylaxis: [id number] Code Status: Full code  Family Communication:  Level of care: Telemetry Medical Dispo:   The patient is from: home Anticipated d/c is to: to be determined Anticipated d/c date is: whenever disposition found   Subjective and Interval History:  Patient was walking around with the help of mobility staff when seen today.  No new concern.   Objective: Vitals:   12/12/23 1501 12/12/23 1539 12/12/23 2044 12/13/23 0754  BP: 129/70 (!) 167/76 130/63 129/64  Pulse: 68 64 81 68  Resp: 18 18 18 16   Temp: (!) 97.4 F (36.3 C)  97.9 F (36.6 C) 97.8 F (36.6 C)  TempSrc: Oral  Oral Oral  SpO2: 98% 100% 97% 96%  Weight:      Height:        Intake/Output Summary (Last 24 hours) at 12/13/2023 1437 Last data filed at 12/13/2023 1415 Gross per 24 hour  Intake 480 ml  Output --  Net 480 ml    Filed Weights   09/04/23 1035 11/28/23 2010  Weight: 90.7 kg 90.7 kg    Examination:   General.  Frail elderly lady, in no acute distress. Pulmonary.  Lungs clear bilaterally, normal respiratory effort. CV.  Regular rate and rhythm, no JVD, rub or murmur. Abdomen.  Soft, nontender, nondistended, BS positive. CNS.  Alert and oriented .  No focal neurologic deficit. Extremities.  No edema, no cyanosis, pulses intact and symmetrical.     Data Reviewed: I have personally reviewed labs and imaging studies  Time spent: 40 minutes  This record has been created using Conservation officer, historic buildings.  Errors have been sought and corrected,but may not always be located. Such creation errors do not reflect on the standard of care.   Arnetha Courser, MD [facility] If 7PM-7AM, please contact night-coverage 12/13/2023, 2:37 PM

## 2023-12-14 DIAGNOSIS — I10 Essential (primary) hypertension: Secondary | ICD-10-CM | POA: Diagnosis not present

## 2023-12-14 DIAGNOSIS — J849 Interstitial pulmonary disease, unspecified: Secondary | ICD-10-CM | POA: Diagnosis not present

## 2023-12-14 DIAGNOSIS — J449 Chronic obstructive pulmonary disease, unspecified: Secondary | ICD-10-CM | POA: Diagnosis not present

## 2023-12-14 DIAGNOSIS — N1831 Chronic kidney disease, stage 3a: Secondary | ICD-10-CM | POA: Diagnosis not present

## 2023-12-14 NOTE — Plan of Care (Signed)

## 2023-12-14 NOTE — Progress Notes (Signed)
 PROGRESS NOTE    Tracy Dickerson  BJY:[id number] DOB: 1941-08-20 DOA: 09/04/2023 PCP: Dwana Melena, PA  219A/219A-AA  LOS: 23 days   Brief hospital course:  Tracy Dickerson is a 83 y.o. female with medical history significant of hypertension, hyperlipidemia, diabetes mellitus, GERD, breast cancer, CKD-3a, septic shock due to UTI, PAF on Eliquis, orthostatic hypotension on Cortef, presented with SOB   Patient has been in the ED for extended period of time since 09/04/23 due to agitation. Patient was evaluated by psychiatry, waiting for placement.  Per ED physician, morning of 11/21/23, patient appeared to be, weaker, developed cough, shortness of breath mild chest pain, found to have oxygen desaturation to 87% on room air, which improved to 95% on 2 L oxygen.  This is new oxygen requirement.  Pt therefore was admitted to the hospitalist service.   She is treated for atypical pneumonia.  Condition has improved.  Currently pending nursing home placement.  Assessment & Plan:   Acute hypoxemic respiratory failure 2/2 Atypical pneumonia, ILD (interstitial lung disease) and COPD (chronic obstructive pulmonary disease) (HCC):  Patient had 2 L new oxygen requirement.  No leukocytosis or fever.  Clinically not septic. --started on IV Rocephin and azithromycin and Solu-Medrol 80 daily.  Steroid d/c'ed. --on room air by 3/1 Antibiotics completed.  Patient improved.  Off oxygen.   HTN (hypertension):  Blood pressure not significant elevated.   HLD (hyperlipidemia) --cont statin   PAF (paroxysmal atrial fibrillation) (HCC):  --cont Eliquis   CKD stage 3a, GFR 45-59 ml/min [facility]): Renal function stable.   Orthostatic hypotension --cont Cortef   Lung nodule:  This is incidental findings by CT. -Need to follow-up with PCP as outpatient   Dementia with behavioral disturbance (HCC) Insomnia. -Fall precaution -Haldol prn for agitation  -Continue Seroquel at night    DVT prophylaxis:  [id number] Code Status: Full code  Family Communication:  Level of care: Telemetry Medical Dispo:   The patient is from: home Anticipated d/c is to: to be determined Anticipated d/c date is: whenever disposition found   Subjective and Interval History:  Patient was sitting comfortably in chair when seen today.  No new concern.  Objective: Vitals:   12/13/23 2016 12/13/23 2033 12/14/23 0404 12/14/23 0824  BP: 118/63 117/62 (!) 116/55 (!) 111/56  Pulse: 80 80 62 (!) 59  Resp: 16 20 16 18   Temp: 97.9 F (36.6 C) 97.8 F (36.6 C) 98 F (36.7 C) 97.9 F (36.6 C)  TempSrc:  Oral  Oral  SpO2: 94% 94% 95% 96%  Weight:      Height:        Intake/Output Summary (Last 24 hours) at 12/14/2023 1533 Last data filed at 12/14/2023 0900 Gross per 24 hour  Intake 240 ml  Output --  Net 240 ml    Filed Weights   09/04/23 1035 11/28/23 2010  Weight: 90.7 kg 90.7 kg    Examination:   General.  Frail elderly lady, in no acute distress. Pulmonary.  Lungs clear bilaterally, normal respiratory effort. CV.  Regular rate and rhythm, no JVD, rub or murmur. Abdomen.  Soft, nontender, nondistended, BS positive. CNS.  Alert and oriented .  No focal neurologic deficit. Extremities.  No edema, no cyanosis, pulses intact and symmetrical.   Data Reviewed: I have personally reviewed labs and imaging studies  Time spent: 39 minutes  This record has been created using Conservation officer, historic buildings. Errors have been sought and corrected,but may not always be located.  Such creation errors do not reflect on the standard of care.   Arnetha Courser, MD [facility] If 7PM-7AM, please contact night-coverage 12/14/2023, 3:33 PM

## 2023-12-14 NOTE — Progress Notes (Signed)
 Mobility Specialist - Progress Note   12/14/23 1233  Mobility  Activity Ambulated with assistance in hallway  Level of Assistance Standby assist, set-up cues, supervision of patient - no hands on  Assistive Device Front wheel walker  Distance Ambulated (ft) 320 ft  Activity Response Tolerated well  Mobility Referral Yes  Mobility visit 1 Mobility  Mobility Specialist Start Time (ACUTE ONLY) 1141  Mobility Specialist Stop Time (ACUTE ONLY) 1156  Mobility Specialist Time Calculation (min) (ACUTE ONLY) 15 min   Pt sitting in recliner on RA upon arrival. Pt STS and ambulates two laps around NS Supervision. Pt returns to recliner with needs in reach.  Terrilyn Saver  Mobility Specialist  12/14/23 12:34 PM

## 2023-12-14 NOTE — Progress Notes (Signed)
 Mobility Specialist - Progress Note   12/14/23 1500  Mobility  Activity Ambulated with assistance in room;Ambulated with assistance to bathroom (alarm set, needs in reach)  Level of Assistance Standby assist, set-up cues, supervision of patient - no hands on  Assistive Device Front wheel walker  Distance Ambulated (ft) 15 ft  Activity Response Tolerated well  Mobility visit 1 Mobility     Responded to chair alarm. Pt ambulating in room; author assisted with clearing pathway as pt is blocked by table tray. Assisted pt to bathroom. VC for navigating in tight spaces and staying within RW BOS as pt does lift RW on 2 wheels x2 during transfer. Pt returned to chair with alarm set, needs in reach.   Filiberto Pinks Mobility Specialist 12/14/23, 3:22 PM

## 2023-12-15 DIAGNOSIS — I10 Essential (primary) hypertension: Secondary | ICD-10-CM | POA: Diagnosis not present

## 2023-12-15 DIAGNOSIS — J449 Chronic obstructive pulmonary disease, unspecified: Secondary | ICD-10-CM | POA: Diagnosis not present

## 2023-12-15 DIAGNOSIS — N1831 Chronic kidney disease, stage 3a: Secondary | ICD-10-CM | POA: Diagnosis not present

## 2023-12-15 DIAGNOSIS — J849 Interstitial pulmonary disease, unspecified: Secondary | ICD-10-CM | POA: Diagnosis not present

## 2023-12-15 NOTE — Plan of Care (Signed)
  Problem: Activity: Goal: Risk for activity intolerance will decrease Outcome: Progressing   Problem: Nutrition: Goal: Adequate nutrition will be maintained Outcome: Progressing   Problem: Coping: Goal: Level of anxiety will decrease Outcome: Progressing   Problem: Elimination: Goal: Will not experience complications related to bowel motility Outcome: Progressing Goal: Will not experience complications related to urinary retention Outcome: Progressing   Problem: Skin Integrity: Goal: Risk for impaired skin integrity will decrease Outcome: Progressing   

## 2023-12-15 NOTE — TOC Progression Note (Signed)
 Transition of Care [facility]) - Progression Note    Patient Details  Name: Tracy Dickerson MRN: [id number] Date of Birth: [birth year]  Transition of Care [facility]) CM/SW Contact  Chapman Fitch, RN Phone Number: 12/15/2023, 3:21 PM  Clinical Narrative:     Raechel Chute case worker at bedside to call social security with patient in order to obtain the final information to move forward with placement   Expected Discharge Plan: Long Term Nursing Home Barriers to Discharge: Continued Medical Work up  Expected Discharge Plan and Services In-house Referral: Clinical Social Work Discharge Planning Services: NA Post Acute Care Choice: Skilled Nursing Facility Living arrangements for the past 2 months: Single Family Home (Unsafe to return)                   DME Agency: NA     Representative spoke with at DME Agency: N/A HH Arranged: NA           Social Determinants of Health (SDOH) Interventions SDOH Screenings   Food Insecurity: No Food Insecurity (11/21/2023)  Housing: Low Risk  (11/21/2023)  Transportation Needs: No Transportation Needs (11/21/2023)  Utilities: Not At Risk (11/21/2023)  Financial Resource Strain: Low Risk  (02/04/2023)   Received from [facility], [facility]  Physical Activity: Inactive (07/16/2021)   Received from [facility], [facility]  Social Connections: Patient Unable To Answer (11/21/2023)  Stress: No Stress Concern Present (07/16/2021)   Received from [facility], [facility]  Tobacco Use: Medium Risk (09/04/2023)  Health Literacy: Medium Risk (07/16/2021)   Received from [facility], [facility]    Readmission Risk Interventions     No data to display

## 2023-12-15 NOTE — Progress Notes (Signed)
 PROGRESS NOTE    Tracy Dickerson  ZOX:[id number] DOB: 28-Jan-1941 DOA: 09/04/2023 PCP: Dwana Melena, PA  219A/219A-AA  LOS: 24 days   Brief hospital course:  Tracy Dickerson is a 83 y.o. female with medical history significant of hypertension, hyperlipidemia, diabetes mellitus, GERD, breast cancer, CKD-3a, septic shock due to UTI, PAF on Eliquis, orthostatic hypotension on Cortef, presented with SOB   Patient has been in the ED for extended period of time since 09/04/23 due to agitation. Patient was evaluated by psychiatry, waiting for placement.  Per ED physician, morning of 11/21/23, patient appeared to be, weaker, developed cough, shortness of breath mild chest pain, found to have oxygen desaturation to 87% on room air, which improved to 95% on 2 L oxygen.  This is new oxygen requirement.  Pt therefore was admitted to the hospitalist service.   She is treated for atypical pneumonia.  Condition has improved.  Currently pending nursing home placement.  Assessment & Plan:   Acute hypoxemic respiratory failure 2/2 Atypical pneumonia, ILD (interstitial lung disease) and COPD (chronic obstructive pulmonary disease) (HCC):  Patient had 2 L new oxygen requirement.  No leukocytosis or fever.  Clinically not septic. --started on IV Rocephin and azithromycin and Solu-Medrol 80 daily.  Steroid d/c'ed. --on room air by 3/1 Antibiotics completed.  Patient improved.  Off oxygen.   HTN (hypertension):  Blood pressure not significant elevated.   HLD (hyperlipidemia) --cont statin   PAF (paroxysmal atrial fibrillation) (HCC):  --cont Eliquis   CKD stage 3a, GFR 45-59 ml/min [facility]): Renal function stable.   Orthostatic hypotension --cont Cortef   Lung nodule:  This is incidental findings by CT. -Need to follow-up with PCP as outpatient   Dementia with behavioral disturbance (HCC) Insomnia. -Fall precaution -Haldol prn for agitation  -Continue Seroquel at night    DVT prophylaxis:  [id number] Code Status: Full code  Family Communication:  Level of care: Telemetry Medical Dispo:   The patient is from: home Anticipated d/c is to: to be determined Anticipated d/c date is: whenever disposition found   Subjective and Interval History:  Patient was getting ready to go for a walk when seen today.  No new concern.  Objective: Vitals:   12/14/23 1600 12/14/23 2011 12/15/23 0419 12/15/23 1533  BP: 121/66 130/75 (!) 116/57 (!) 146/60  Pulse: 67 75 (!) 56 61  Resp: 20 18 18 18   Temp: 98.4 F (36.9 C) 98.2 F (36.8 C) 98.2 F (36.8 C) 97.8 F (36.6 C)  TempSrc: Oral Oral Oral Oral  SpO2: 97% 95% 96% 96%  Weight:      Height:       No intake or output data in the 24 hours ending 12/15/23 1543   Filed Weights   09/04/23 1035 11/28/23 2010  Weight: 90.7 kg 90.7 kg    Examination:   General.  Well-developed elderly lady, in no acute distress. Pulmonary.  Lungs clear bilaterally, normal respiratory effort. CV.  Regular rate and rhythm, no JVD, rub or murmur. Abdomen.  Soft, nontender, nondistended, BS positive. CNS.  Alert and oriented .  No focal neurologic deficit. Extremities.  No edema, no cyanosis, pulses intact and symmetrical.   Data Reviewed: I have personally reviewed labs and imaging studies  Time spent: 40 minutes  This record has been created using Conservation officer, historic buildings. Errors have been sought and corrected,but may not always be located. Such creation errors do not reflect on the standard of care.   Arnetha Courser, MD  [facility] If 7PM-7AM, please contact night-coverage 12/15/2023, 3:43 PM

## 2023-12-15 NOTE — Progress Notes (Signed)
 Mobility Specialist - Progress Note   12/15/23 1102  Mobility  Activity Ambulated independently in hallway  Level of Assistance Modified independent, requires aide device or extra time  Assistive Device Front wheel walker  Distance Ambulated (ft) 160 ft  Activity Response Tolerated well  Mobility visit 1 Mobility  Mobility Specialist Start Time (ACUTE ONLY) 0941  Mobility Specialist Stop Time (ACUTE ONLY) 0946  Mobility Specialist Time Calculation (min) (ACUTE ONLY) 5 min   Pt sitting in the recliner upon entry, utilizing RA. Pt motivated and agreeable to amb this date. Pt STS to RW and amb one lap around the NS ModI-- steady pace. Pt returned to the room, left seated in the recliner with alarm set and needs within reach.  Zetta Bills Mobility Specialist 12/15/23 11:05 AM

## 2023-12-16 DIAGNOSIS — I10 Essential (primary) hypertension: Secondary | ICD-10-CM | POA: Diagnosis not present

## 2023-12-16 DIAGNOSIS — N1831 Chronic kidney disease, stage 3a: Secondary | ICD-10-CM | POA: Diagnosis not present

## 2023-12-16 DIAGNOSIS — J189 Pneumonia, unspecified organism: Secondary | ICD-10-CM | POA: Diagnosis not present

## 2023-12-16 DIAGNOSIS — J449 Chronic obstructive pulmonary disease, unspecified: Secondary | ICD-10-CM | POA: Diagnosis not present

## 2023-12-16 NOTE — Progress Notes (Signed)
 PROGRESS NOTE    Tracy Dickerson  ZOX:[id number] DOB: June 05, 1941 DOA: 09/04/2023 PCP: Dwana Melena, PA  219A/219A-AA  LOS: 25 days   Brief hospital course:  Tracy Dickerson is a 83 y.o. female with medical history significant of hypertension, hyperlipidemia, diabetes mellitus, GERD, breast cancer, CKD-3a, septic shock due to UTI, PAF on Eliquis, orthostatic hypotension on Cortef, presented with SOB   Patient has been in the ED for extended period of time since 09/04/23 due to agitation. Patient was evaluated by psychiatry, waiting for placement.  Per ED physician, morning of 11/21/23, patient appeared to be, weaker, developed cough, shortness of breath mild chest pain, found to have oxygen desaturation to 87% on room air, which improved to 95% on 2 L oxygen.  This is new oxygen requirement.  Pt therefore was admitted to the hospitalist service.   She is treated for atypical pneumonia.  Condition has improved.  Currently pending nursing home placement.  Assessment & Plan:   Acute hypoxemic respiratory failure 2/2 Atypical pneumonia, ILD (interstitial lung disease) and COPD (chronic obstructive pulmonary disease) (HCC):  Patient had 2 L new oxygen requirement.  No leukocytosis or fever.  Clinically not septic. --started on IV Rocephin and azithromycin and Solu-Medrol 80 daily.  Steroid d/c'ed. --on room air by 3/1 Antibiotics completed.  Patient improved.  Off oxygen.   HTN (hypertension):  Blood pressure not significant elevated.   HLD (hyperlipidemia) --cont statin   PAF (paroxysmal atrial fibrillation) (HCC):  --cont Eliquis   CKD stage 3a, GFR 45-59 ml/min [facility]): Renal function stable.   Orthostatic hypotension --cont Cortef   Lung nodule:  This is incidental findings by CT. -Need to follow-up with PCP as outpatient   Dementia with behavioral disturbance (HCC) Insomnia. -Fall precaution -Haldol prn for agitation  -Continue Seroquel at night    DVT prophylaxis:  [id number] Code Status: Full code  Family Communication:  Level of care: Telemetry Medical Dispo:   The patient is from: home Anticipated d/c is to: to be determined Anticipated d/c date is: whenever disposition found   Subjective and Interval History:  Patient was sitting comfortably in chair when seen today.  No new concern.  Objective: Vitals:   12/15/23 2055 12/16/23 0401 12/16/23 0724 12/16/23 1546  BP: (!) 148/78 (!) 113/56 112/61 116/66  Pulse: 82 60 63 74  Resp: 20 20 19 18   Temp: 98 F (36.7 C) 97.9 F (36.6 C) 98.2 F (36.8 C) 98 F (36.7 C)  TempSrc:   Oral Oral  SpO2: 93% 94% 95% 96%  Weight:      Height:        Intake/Output Summary (Last 24 hours) at 12/16/2023 1625 Last data filed at 12/16/2023 1500 Gross per 24 hour  Intake 0 ml  Output --  Net 0 ml     Filed Weights   09/04/23 1035 11/28/23 2010  Weight: 90.7 kg 90.7 kg    Examination:   General.  Frail elderly lady, in no acute distress. Pulmonary.  Lungs clear bilaterally, normal respiratory effort. CV.  Regular rate and rhythm, no JVD, rub or murmur. Abdomen.  Soft, nontender, nondistended, BS positive. CNS.  Alert and oriented .  No focal neurologic deficit. Extremities.  No edema, no cyanosis, pulses intact and symmetrical.  Data Reviewed: I have personally reviewed labs and imaging studies  Time spent: 39 minutes  This record has been created using Conservation officer, historic buildings. Errors have been sought and corrected,but may not always be located. Such  creation errors do not reflect on the standard of care.   Arnetha Courser, MD [facility] If 7PM-7AM, please contact night-coverage 12/16/2023, 4:25 PM

## 2023-12-16 NOTE — Plan of Care (Signed)

## 2023-12-16 NOTE — Plan of Care (Signed)
  Problem: Clinical Measurements: Goal: Ability to maintain clinical measurements within normal limits will improve Outcome: Progressing Goal: Will remain free from infection Outcome: Progressing Goal: Diagnostic test results will improve Outcome: Progressing Goal: Respiratory complications will improve Outcome: Progressing Goal: Cardiovascular complication will be avoided Outcome: Progressing   Problem: Activity: Goal: Risk for activity intolerance will decrease Outcome: Progressing   Problem: Nutrition: Goal: Adequate nutrition will be maintained Outcome: Progressing   Problem: Elimination: Goal: Will not experience complications related to bowel motility Outcome: Progressing Goal: Will not experience complications related to urinary retention Outcome: Progressing   Problem: Pain Managment: Goal: General experience of comfort will improve and/or be controlled Outcome: Progressing   Problem: Safety: Goal: Ability to remain free from injury will improve Outcome: Progressing

## 2023-12-16 NOTE — TOC Progression Note (Signed)
 Transition of Care [facility]) - Progression Note    Patient Details  Name: Tracy Dickerson MRN: [id number] Date of Birth: Aug 19, 1941  Transition of Care [facility]) CM/SW Contact  Chapman Fitch, RN Phone Number: 12/16/2023, 3:36 PM  Clinical Narrative:    No TOC update today    Expected Discharge Plan: Long Term Nursing Home Barriers to Discharge: Continued Medical Work up  Expected Discharge Plan and Services In-house Referral: Clinical Social Work Discharge Planning Services: NA Post Acute Care Choice: Skilled Nursing Facility Living arrangements for the past 2 months: Single Family Home (Unsafe to return)                   DME Agency: NA     Representative spoke with at DME Agency: N/A HH Arranged: NA           Social Determinants of Health (SDOH) Interventions SDOH Screenings   Food Insecurity: No Food Insecurity (11/21/2023)  Housing: Low Risk  (11/21/2023)  Transportation Needs: No Transportation Needs (11/21/2023)  Utilities: Not At Risk (11/21/2023)  Financial Resource Strain: Low Risk  (02/04/2023)   Received from [facility], [facility]  Physical Activity: Inactive (07/16/2021)   Received from [facility], [facility]  Social Connections: Patient Unable To Answer (11/21/2023)  Stress: No Stress Concern Present (07/16/2021)   Received from [facility], [facility]  Tobacco Use: Medium Risk (09/04/2023)  Health Literacy: Medium Risk (07/16/2021)   [facility] from [facility]    Readmission Risk Interventions     No data to display

## 2023-12-16 NOTE — Progress Notes (Signed)
 Mobility Specialist - Progress Note   12/16/23 1116  Mobility  Activity Ambulated independently in hallway  Level of Assistance Modified independent, requires aide device or extra time  Assistive Device Front wheel walker  Distance Ambulated (ft) 320 ft  Activity Response Tolerated well  Mobility visit 1 Mobility   Pt sitting in the recliner upon entry, utilizing RA. Pt motivated and agreeable to OOB amb this date. Pt STS to RW and amb two laps around the NS ModI. Pt returned to the room, left seated in the recliner with alarm set and needs within reach. RN notified.  Zetta Bills Mobility Specialist 12/16/23 11:18 AM

## 2023-12-17 DIAGNOSIS — N1831 Chronic kidney disease, stage 3a: Secondary | ICD-10-CM | POA: Diagnosis not present

## 2023-12-17 DIAGNOSIS — J449 Chronic obstructive pulmonary disease, unspecified: Secondary | ICD-10-CM | POA: Diagnosis not present

## 2023-12-17 DIAGNOSIS — I10 Essential (primary) hypertension: Secondary | ICD-10-CM | POA: Diagnosis not present

## 2023-12-17 DIAGNOSIS — J849 Interstitial pulmonary disease, unspecified: Secondary | ICD-10-CM | POA: Diagnosis not present

## 2023-12-17 NOTE — Progress Notes (Signed)
 PROGRESS NOTE    Tracy Dickerson  [name] DOB: [birth year] DOA: 09/04/2023 PCP: Dwana Melena, PA  219A/219A-AA  LOS: 26 days   Brief hospital course:  Tracy Dickerson is a 83 y.o. female with medical history significant of hypertension, hyperlipidemia, diabetes mellitus, GERD, breast cancer, CKD-3a, septic shock due to UTI, PAF on Eliquis, orthostatic hypotension on Cortef, presented with SOB   Patient has been in the ED for extended period of time since 09/04/23 due to agitation. Patient was evaluated by psychiatry, waiting for placement.  Per ED physician, morning of 11/21/23, patient appeared to be, weaker, developed cough, shortness of breath mild chest pain, found to have oxygen desaturation to 87% on room air, which improved to 95% on 2 L oxygen.  This is new oxygen requirement.  Pt therefore was admitted to the hospitalist service.   She is treated for atypical pneumonia.  Condition has improved.  Currently pending nursing home placement.  3/26: Still awaiting placement  Assessment & Plan:   Acute hypoxemic respiratory failure 2/2 Atypical pneumonia, ILD (interstitial lung disease) and COPD (chronic obstructive pulmonary disease) (HCC):  Patient had 2 L new oxygen requirement.  No leukocytosis or fever.  Clinically not septic. --started on IV Rocephin and azithromycin and Solu-Medrol 80 daily.  Steroid d/c'ed. --on room air by 3/1 Antibiotics completed.  Patient improved.  Off oxygen.   HTN (hypertension):  Blood pressure not significant elevated.   HLD (hyperlipidemia) --cont statin   PAF (paroxysmal atrial fibrillation) (HCC):  --cont Eliquis   CKD stage 3a, GFR 45-59 ml/min [facility]): Renal function stable.   Orthostatic hypotension --cont Cortef   Lung nodule:  This is incidental findings by CT. -Need to follow-up with PCP as outpatient   Dementia with behavioral disturbance (HCC) Insomnia. -Fall precaution -Haldol prn for agitation  -Continue Seroquel at  night    DVT prophylaxis: [id number] Code Status: Full code  Family Communication:  Level of care: Telemetry Medical Dispo:   The patient is from: home Anticipated d/c is to: to be determined Anticipated d/c date is: whenever disposition found   Subjective and Interval History:  Patient was seen and examined today.  No new concern  Objective: Vitals:   12/16/23 2058 12/17/23 0530 12/17/23 0757 12/17/23 1432  BP: (!) 146/66 117/87 (!) 105/55 (!) 128/57  Pulse: 80 63 62 92  Resp: 18 16 16 18   Temp: 98.3 F (36.8 C) (!) 97.5 F (36.4 C) 98.2 F (36.8 C) 97.8 F (36.6 C)  TempSrc: Oral Oral Oral Oral  SpO2: 95% 96% 96% 97%  Weight:      Height:        Intake/Output Summary (Last 24 hours) at 12/17/2023 1629 Last data filed at 12/17/2023 1000 Gross per 24 hour  Intake 440 ml  Output --  Net 440 ml     Filed Weights   09/04/23 1035 11/28/23 2010  Weight: 90.7 kg 90.7 kg    Examination:   General.  Frail elderly lady, in no acute distress. Pulmonary.  Lungs clear bilaterally, normal respiratory effort. CV.  Regular rate and rhythm, no JVD, rub or murmur. Abdomen.  Soft, nontender, nondistended, BS positive. CNS.  Alert and oriented .  No focal neurologic deficit. Extremities.  No edema, no cyanosis, pulses intact and symmetrical.   Data Reviewed: I have personally reviewed labs and imaging studies  Time spent: 38 minutes  This record has been created using Conservation officer, historic buildings. Errors have been sought and corrected,but may  not always be located. Such creation errors do not reflect on the standard of care.   Arnetha Courser, MD [facility] If 7PM-7AM, please contact night-coverage 12/17/2023, 4:29 PM

## 2023-12-17 NOTE — Plan of Care (Signed)
  Problem: Pain Managment: Goal: General experience of comfort will improve and/or be controlled Outcome: Progressing   Problem: Activity: Goal: Risk for activity intolerance will decrease Outcome: Progressing   Problem: Safety: Goal: Ability to remain free from injury will improve Outcome: Progressing   Problem: Skin Integrity: Goal: Risk for impaired skin integrity will decrease Outcome: Progressing

## 2023-12-17 NOTE — Plan of Care (Signed)
 Received report, up to BR in room, denies pain, tolerating fluids. Continue to monitor.  2200 Took po meds well, tolerating fluids. Denies pain. 0012 sleeping when checked, moves well in bed. 0600 slept 5-6 hours

## 2023-12-17 NOTE — Progress Notes (Signed)
 Mobility Specialist - Progress Note   12/17/23 0941  Mobility  Activity Ambulated independently in hallway  Level of Assistance Modified independent, requires aide device or extra time  Assistive Device Front wheel walker  Distance Ambulated (ft) 160 ft  Activity Response Tolerated well  Mobility visit 1 Mobility  Mobility Specialist Start Time (ACUTE ONLY) 0935  Mobility Specialist Stop Time (ACUTE ONLY) 0939  Mobility Specialist Time Calculation (min) (ACUTE ONLY) 4 min   Pt sitting in the recliner upon entry, utilizing RA. Pt motivated and agreeable to OOB amb this date. Pt STS to RW and amb one lap around the NS ModI, tolerated well. Pt returned to the room, left seated in the recliner with alarm set and needs within reach.  Zetta Bills Mobility Specialist 12/17/23 9:44 AM

## 2023-12-18 DIAGNOSIS — J189 Pneumonia, unspecified organism: Secondary | ICD-10-CM | POA: Diagnosis not present

## 2023-12-18 NOTE — Progress Notes (Signed)
  PROGRESS NOTE    BRITNEY NEWSTROM  ZOX:[id number] DOB: 1940-10-19 DOA: 09/04/2023 PCP: Dwana Melena, PA  219A/219A-AA  LOS: 27 days   Brief hospital course:   Assessment & Plan: Tracy Dickerson is a 83 y.o. female with medical history significant of hypertension, hyperlipidemia, diabetes mellitus, GERD, breast cancer, CKD-3a, septic shock due to UTI, PAF on Eliquis, orthostatic hypotension on Cortef, presented with SOB   Patient has been in the ED for extended period of time since 09/04/23 due to agitation. Patient was evaluated by psychiatry, waiting for placement.  Per ED physician, morning of 11/21/23, patient appeared to be, weaker, developed cough, shortness of breath mild chest pain, found to have oxygen desaturation to 87% on room air, which improved to 95% on 2 L oxygen.  This is new oxygen requirement.  Pt therefore was admitted to the hospitalist service.   She is treated for atypical pneumonia.  Condition has improved.  Currently pending nursing home placement.   Acute hypoxemic respiratory failure 2/2 Atypical pneumonia, ILD (interstitial lung disease) and COPD (chronic obstructive pulmonary disease) (HCC):  Patient had 2 L new oxygen requirement.  No leukocytosis or fever.  Clinically not septic. --started on IV Rocephin and azithromycin and Solu-Medrol 80 daily.  Steroid d/c'ed. --on room air by 3/1 Antibiotics completed.  Patient improved.  Off oxygen.   HTN (hypertension):  Blood pressure not significant elevated.   HLD (hyperlipidemia) --cont statin   PAF (paroxysmal atrial fibrillation) (HCC):  --cont Eliquis   CKD stage 3a, GFR 45-59 ml/min [facility]): Renal function stable.   Orthostatic hypotension --cont Cortef   Lung nodule:  This is incidental findings by CT. -Need to follow-up with PCP as outpatient   Dementia with behavioral disturbance (HCC) Insomnia. -Fall precaution -Haldol prn for agitation  -Continue Seroquel at night   DVT prophylaxis:  [id number] Code Status: Full code  Family Communication:  Level of care: Telemetry Medical Dispo:   The patient is from: home Anticipated d/c is to: to be determined Anticipated d/c date is: to be detemrined   Subjective and Interval History:  No change today   Objective: Vitals:   12/17/23 2037 12/18/23 0408 12/18/23 0925 12/18/23 1635  BP: (!) 150/71 (!) 140/68 (!) 113/58 130/62  Pulse: 79 74 68 65  Resp: 16 16 18 18   Temp: 98.1 F (36.7 C) 97.9 F (36.6 C) 97.7 F (36.5 C) 98 F (36.7 C)  TempSrc: Oral     SpO2: 95% 96% 97% 96%  Weight:      Height:        Intake/Output Summary (Last 24 hours) at 12/18/2023 2011 Last data filed at 12/18/2023 1900 Gross per 24 hour  Intake 580 ml  Output --  Net 580 ml   Filed Weights   09/04/23 1035 11/28/23 2010  Weight: 90.7 kg 90.7 kg    Examination:   Constitutional: NAD, alert, oriented to person and place HEENT: conjunctivae and lids normal, EOMI CV: No cyanosis.   RESP: normal respiratory effort, on RA Neuro: II - XII grossly intact.   Psych: flat mood and affect.     Data Reviewed: I have personally reviewed labs and imaging studies  Time spent: 25 minutes  Darlin Priestly, MD [facility] If 7PM-7AM, please contact night-coverage 12/18/2023, 8:11 PM

## 2023-12-18 NOTE — TOC Progression Note (Addendum)
 Transition of Care [facility]) - Progression Note    Patient Details  Name: Tracy Dickerson MRN: [id number] Date of Birth: [birth year]  Transition of Care [facility]) CM/SW Contact  Chapman Fitch, RN Phone Number: 12/18/2023, 11:57 AM  Clinical Narrative:    Secure email sent to guardian [name], and [name] at [facility] requesting update    Update: Per Guardian [name] "the benefit letter received from [facility] does not reflect her benefit amount  [employer] is saying that her address needs to be verified in order to get this completed  We are currently working on this"  Per [employer] at [facility] "please advise as last conversation we were waiting for [facility] and the hospital to call social security. We were needing her to assist with getting her social security  (questions answered by patient) reinstated and benefit letter to guardian for proof of amounts.  I'm still waiting for this to take place. Then we can proceed with updated Fl2 and discharge summary for a date to move in."  Expected Discharge Plan: Long Term Nursing Home Barriers to Discharge: Continued Medical Work up  Expected Discharge Plan and Services In-house Referral: Clinical Social Work Discharge Planning Services: NA Post Acute Care Choice: Skilled Nursing Facility Living arrangements for the past 2 months: Single Family Home (Unsafe to return)                   DME Agency: NA     Representative spoke with at DME Agency: N/A HH Arranged: NA           Social Determinants of Health (SDOH) Interventions SDOH Screenings   Food Insecurity: No Food Insecurity (11/21/2023)  Housing: Low Risk  (11/21/2023)  Transportation Needs: No Transportation Needs (11/21/2023)  Utilities: Not At Risk (11/21/2023)  Financial Resource Strain: Low Risk  (02/04/2023)   Received from [facility], [facility]  Physical Activity: Inactive (07/16/2021)   Received from [facility], [facility]  Social Connections: Patient  Unable To Answer (11/21/2023)  Stress: No Stress Concern Present (07/16/2021)   Received from [facility], [facility]  Tobacco Use: Medium Risk (09/04/2023)  Health Literacy: Medium Risk (07/16/2021)   Received from [facility], [facility]    Readmission Risk Interventions     No data to display

## 2023-12-18 NOTE — Progress Notes (Signed)
 Mobility Specialist - Progress Note   12/18/23 1005  Mobility  Activity Ambulated independently in hallway  Level of Assistance Modified independent, requires aide device or extra time  Assistive Device Front wheel walker  Distance Ambulated (ft) 320 ft  Activity Response Tolerated well  Mobility visit 1 Mobility  Mobility Specialist Start Time (ACUTE ONLY) [id number]  Mobility Specialist Stop Time (ACUTE ONLY) 0944  Mobility Specialist Time Calculation (min) (ACUTE ONLY) 7 min   Pt sitting in the recliner upon entry, utilizing RA. Pt motivated and agreeable to OOB amb this date. Pt STS to RW and amb two laps around the NS ModI, tolerated well. Pt returned to the room, left sitting in the recliner with alarm set and needs within reach.  Zetta Bills Mobility Specialist 12/18/23 10:07 AM

## 2023-12-18 NOTE — Plan of Care (Signed)

## 2023-12-19 DIAGNOSIS — J189 Pneumonia, unspecified organism: Secondary | ICD-10-CM | POA: Diagnosis not present

## 2023-12-19 NOTE — Progress Notes (Signed)
 Mobility Specialist - Progress Note   12/19/23 1012  Mobility  Activity Ambulated independently in hallway  Level of Assistance Modified independent, requires aide device or extra time  Assistive Device Front wheel walker  Distance Ambulated (ft) 320 ft  Activity Response Tolerated well  Mobility visit 1 Mobility  Mobility Specialist Start Time (ACUTE ONLY) [id number]  Mobility Specialist Stop Time (ACUTE ONLY) 0956  Mobility Specialist Time Calculation (min) (ACUTE ONLY) 5 min   Pt sitting in the recliner upon entry, utilizing RA. Pt motivated and agreeable to OOB amb this date. Pt STS to RW and amb two laps around the NS ModI, tolerated well. Pt returned to the room, left seated in the recliner with alarm set and needs within reach.  Zetta Bills Mobility Specialist 12/19/23 10:14 AM

## 2023-12-19 NOTE — Progress Notes (Signed)
 Mobility Specialist - Progress Note   12/19/23 1436  Mobility  Activity Ambulated independently in hallway  Level of Assistance Modified independent, requires aide device or extra time  Assistive Device Front wheel walker  Distance Ambulated (ft) 12 ft  Activity Response Tolerated well  Mobility visit 1 Mobility  Mobility Specialist Start Time (ACUTE ONLY) 1354  Mobility Specialist Stop Time (ACUTE ONLY) 1428  Mobility Specialist Time Calculation (min) (ACUTE ONLY) 34 min   Pt sitting in the recliner upon entry, utilizing RA. Pt amb to the W/C and wheeled outside for leisure. While outside, Pt transfers from the W/C to the bench and sits for ~15-20 mins before returning to the unit. Pt left seated in the recliner with alarm set and needs within reach.  Zetta Bills Mobility Specialist 12/19/23 2:39 PM

## 2023-12-19 NOTE — Progress Notes (Signed)
 PROGRESS NOTE    Tracy Dickerson  [id number]:[id number] DOB: 10-20-1940 DOA: 09/04/2023 PCP: Dwana Melena, PA  219A/219A-AA  LOS: 28 days   Brief hospital course:   Assessment & Plan: Tracy Dickerson is a 83 y.o. female with medical history significant of hypertension, hyperlipidemia, diabetes mellitus, GERD, breast cancer, CKD-3a, septic shock due to UTI, PAF on Eliquis, orthostatic hypotension on Cortef, presented with SOB   Patient has been in the ED for extended period of time since 09/04/23 due to agitation. Patient was evaluated by psychiatry, waiting for placement.  Per ED physician, morning of 11/21/23, patient appeared to be, weaker, developed cough, shortness of breath mild chest pain, found to have oxygen desaturation to 87% on room air, which improved to 95% on 2 L oxygen.  This is new oxygen requirement.  Pt therefore was admitted to the hospitalist service.   She is treated for atypical pneumonia.  Condition has improved.  Currently pending nursing home placement.   Acute hypoxemic respiratory failure 2/2 Atypical pneumonia, ILD (interstitial lung disease) and COPD (chronic obstructive pulmonary disease) (HCC):  Patient had 2 L new oxygen requirement.  No leukocytosis or fever.  Clinically not septic. --started on IV Rocephin and azithromycin and Solu-Medrol 80 daily.  Steroid d/c'ed. --on room air by 3/1 Antibiotics completed.  Patient improved.  Off oxygen.   HTN (hypertension):  Blood pressure not significant elevated.   HLD (hyperlipidemia) --cont statin   PAF (paroxysmal atrial fibrillation) (HCC):  --cont Eliquis   CKD stage 3a, GFR 45-59 ml/min [facility]): Renal function stable.   Orthostatic hypotension --cont Cortef   Lung nodule:  This is incidental findings by CT. -Need to follow-up with PCP as outpatient   Dementia with behavioral disturbance (HCC) Insomnia. -Fall precaution -Haldol prn for agitation  -Continue Seroquel at night   DVT prophylaxis:  [id number] Code Status: Full code  Family Communication:  Level of care: Telemetry Medical Dispo:   The patient is from: home Anticipated d/c is to: to be determined Anticipated d/c date is: to be detemrined   Subjective and Interval History:  RN reported pt has more agitation, pacing in the halls and frequent bathroom trips, and was concerned about UTI.  Pt denied dysuria, abdominal pain, and said she drank plenty of fluids.  Pt denied anything bothering her.   Objective: Vitals:   12/18/23 2114 12/19/23 0317 12/19/23 0755 12/19/23 1431  BP: 134/64 123/63 116/62 126/61  Pulse: 75 64 63 69  Resp: 20 20 18 18   Temp: 98.5 F (36.9 C) 97.8 F (36.6 C) 97.8 F (36.6 C) 98 F (36.7 C)  TempSrc:      SpO2: 93% 95% 96% 95%  Weight:      Height:        Intake/Output Summary (Last 24 hours) at 12/19/2023 1924 Last data filed at 12/19/2023 1056 Gross per 24 hour  Intake 120 ml  Output --  Net 120 ml   Filed Weights   09/04/23 1035 11/28/23 2010  Weight: 90.7 kg 90.7 kg    Examination:   Constitutional: NAD, alert, oriented to person and place, answer questions appropriately.  HEENT: conjunctivae and lids normal, EOMI CV: No cyanosis.   RESP: normal respiratory effort, on RA Neuro: II - XII grossly intact.   Psych: flat mood and affect.     Data Reviewed: I have personally reviewed labs and imaging studies  Time spent: 25 minutes  Darlin Priestly, MD [facility] If 7PM-7AM, please contact night-coverage 12/19/2023, 7:24  PM

## 2023-12-19 NOTE — Plan of Care (Signed)

## 2023-12-20 DIAGNOSIS — J189 Pneumonia, unspecified organism: Secondary | ICD-10-CM | POA: Diagnosis not present

## 2023-12-20 LAB — BASIC METABOLIC PANEL WITH GFR
Anion gap: 6 (ref 5–15)
BUN: 23 mg/dL (ref 8–23)
CO2: 26 mmol/L (ref 22–32)
Calcium: 8.8 mg/dL — ABNORMAL LOW (ref 8.9–10.3)
Chloride: 108 mmol/L (ref 98–111)
Creatinine, Ser: 0.98 mg/dL (ref 0.44–1.00)
GFR, Estimated: 58 mL/min — ABNORMAL LOW (ref >60)
Glucose, Bld: 85 mg/dL (ref 70–99)
Potassium: 3.8 mmol/L (ref 3.5–5.1)
Sodium: 140 mmol/L (ref 135–145)

## 2023-12-20 LAB — CBC
HCT: 28 % — ABNORMAL LOW (ref 36.0–46.0)
Hemoglobin: 10.1 g/dL — ABNORMAL LOW (ref 12.0–15.0)
MCH: 34.8 pg — ABNORMAL HIGH (ref 26.0–34.0)
MCHC: 36.1 g/dL — ABNORMAL HIGH (ref 30.0–36.0)
MCV: 96.6 fL (ref 80.0–100.0)
Platelets: 158 10*3/uL (ref 150–400)
RBC: 2.9 MIL/uL — ABNORMAL LOW (ref 3.87–5.11)
RDW: 14.6 % (ref 11.5–15.5)
WBC: 5.6 10*3/uL (ref 4.0–10.5)
nRBC: 0 % (ref 0.0–0.2)

## 2023-12-20 LAB — MAGNESIUM: Magnesium: 2.3 mg/dL (ref 1.7–2.4)

## 2023-12-20 NOTE — Progress Notes (Signed)
  PROGRESS NOTE    Tracy Dickerson  ZOX:[id number] DOB: January 12, 1941 DOA: 09/04/2023 PCP: Dwana Melena, PA  219A/219A-AA  LOS: 29 days   Brief hospital course:   Assessment & Plan: Tracy Dickerson is a 83 y.o. female with medical history significant of hypertension, hyperlipidemia, diabetes mellitus, GERD, breast cancer, CKD-3a, septic shock due to UTI, PAF on Eliquis, orthostatic hypotension on Cortef, presented with SOB   Patient has been in the ED for extended period of time since 09/04/23 due to agitation. Patient was evaluated by psychiatry, waiting for placement.  Per ED physician, morning of 11/21/23, patient appeared to be, weaker, developed cough, shortness of breath mild chest pain, found to have oxygen desaturation to 87% on room air, which improved to 95% on 2 L oxygen.  This is new oxygen requirement.  Pt therefore was admitted to the hospitalist service.   She is treated for atypical pneumonia.  Condition has improved.  Currently pending nursing home placement.   Acute hypoxemic respiratory failure 2/2 Atypical pneumonia, ILD (interstitial lung disease) and COPD (chronic obstructive pulmonary disease) (HCC):  Patient had 2 L new oxygen requirement.  No leukocytosis or fever.  Clinically not septic. --started on IV Rocephin and azithromycin and Solu-Medrol 80 daily.  Steroid d/c'ed. --on room air by 3/1 Antibiotics completed.  Patient improved.  Off oxygen.   HTN (hypertension):  Blood pressure not significant elevated.   HLD (hyperlipidemia) --cont statin   PAF (paroxysmal atrial fibrillation) (HCC):  --cont Eliquis   CKD stage 3a, GFR 45-59 ml/min [facility]): Renal function stable.   Orthostatic hypotension --cont Cortef   Lung nodule:  This is incidental findings by CT. -Need to follow-up with PCP as outpatient   Dementia with behavioral disturbance (HCC) Insomnia. -Fall precaution -Haldol prn for agitation  -Continue Seroquel at night   DVT prophylaxis:  [id number] Code Status: Full code  Family Communication:  Level of care: Telemetry Medical Dispo:   The patient is from: home Anticipated d/c is to: to be determined Anticipated d/c date is: to be detemrined   Subjective and Interval History:  Pt said she enjoys her walks, and is doing better.     Objective: Vitals:   12/19/23 1431 12/19/23 2032 12/20/23 0404 12/20/23 0810  BP: 126/61 (!) 133/44 117/63 118/67  Pulse: 69 74 62 (!) 55  Resp: 18 16 16 16   Temp: 98 F (36.7 C) 98.1 F (36.7 C) 98 F (36.7 C) 98 F (36.7 C)  TempSrc:      SpO2: 95% 96% 94% 94%  Weight:      Height:        Intake/Output Summary (Last 24 hours) at 12/20/2023 1750 Last data filed at 12/20/2023 1100 Gross per 24 hour  Intake 240 ml  Output --  Net 240 ml   Filed Weights   09/04/23 1035 11/28/23 2010  Weight: 90.7 kg 90.7 kg    Examination:   Constitutional: NAD, alert oriented to person and place HEENT: conjunctivae and lids normal, EOMI CV: No cyanosis.   RESP: normal respiratory effort, on RA Neuro: II - XII grossly intact.   Psych: flat mood and affect.     Data Reviewed: I have personally reviewed labs and imaging studies  Time spent: 25 minutes  Darlin Priestly, MD [facility] If 7PM-7AM, please contact night-coverage 12/20/2023, 5:50 PM

## 2023-12-20 NOTE — Plan of Care (Signed)

## 2023-12-20 NOTE — Progress Notes (Signed)
 Mobility Specialist - Progress Note   12/20/23 1117  Mobility  Activity Ambulated independently in hallway;Stood at bedside;Dangled on edge of bed  Level of Assistance Independent  Assistive Device Front wheel walker  Distance Ambulated (ft) 320 ft  Activity Response Tolerated well  Mobility Referral Yes  Mobility visit 1 Mobility  Mobility Specialist Start Time (ACUTE ONLY) 1101  Mobility Specialist Stop Time (ACUTE ONLY) 1115  Mobility Specialist Time Calculation (min) (ACUTE ONLY) 14 min   Pt semi-supine in bed on RA upon arrival. Pt STS and ambulates 2 laps around NS MODI. Pt returns to EOB with needs in reach.   Terrilyn Saver  Mobility Specialist  12/20/23 11:18 AM

## 2023-12-20 NOTE — Plan of Care (Signed)

## 2023-12-21 DIAGNOSIS — J189 Pneumonia, unspecified organism: Secondary | ICD-10-CM | POA: Diagnosis not present

## 2023-12-21 NOTE — Progress Notes (Signed)
  PROGRESS NOTE    EMMAMAE Dickerson  OZH:[id number] DOB: 1940/09/24 DOA: 09/04/2023 PCP: Dwana Melena, PA  219A/219A-AA  LOS: 30 days   Brief hospital course:   Assessment & Plan: Tracy Dickerson is a 83 y.o. female with medical history significant of hypertension, hyperlipidemia, diabetes mellitus, GERD, breast cancer, CKD-3a, septic shock due to UTI, PAF on Eliquis, orthostatic hypotension on Cortef, presented with SOB   Patient has been in the ED for extended period of time since 09/04/23 due to agitation. Patient was evaluated by psychiatry, waiting for placement.  Per ED physician, morning of 11/21/23, patient appeared to be, weaker, developed cough, shortness of breath mild chest pain, found to have oxygen desaturation to 87% on room air, which improved to 95% on 2 L oxygen.  This is new oxygen requirement.  Pt therefore was admitted to the hospitalist service.   She is treated for atypical pneumonia.  Condition has improved.  Currently pending nursing home placement.   Acute hypoxemic respiratory failure 2/2 Atypical pneumonia, ILD (interstitial lung disease) and COPD (chronic obstructive pulmonary disease) (HCC):  Patient had 2 L new oxygen requirement.  No leukocytosis or fever.  Clinically not septic. --started on IV Rocephin and azithromycin and Solu-Medrol 80 daily.  Steroid d/c'ed. --on room air by 3/1 Antibiotics completed.  Patient improved.  Off oxygen.   HTN (hypertension):  Blood pressure not significant elevated.   HLD (hyperlipidemia) --cont statin   PAF (paroxysmal atrial fibrillation) (HCC):  --cont Eliquis   CKD stage 3a, GFR 45-59 ml/min [facility]): Renal function stable.   Orthostatic hypotension --cont Cortef   Lung nodule:  This is incidental findings by CT. -Need to follow-up with PCP as outpatient   Dementia with behavioral disturbance (HCC) Insomnia. -Fall precaution -Haldol prn for agitation  -Continue Seroquel at night   DVT prophylaxis:  [id number] Code Status: Full code  Family Communication:  Level of care: Telemetry Medical Dispo:   The patient is from: home Anticipated d/c is to: to be determined Anticipated d/c date is: to be detemrined   Subjective and Interval History:  Pt was seen walking around the halls, and returned to her room by herself.  Nursing reported pt tried to go out of the double door of the unit and was not happy when prevented.   Objective: Vitals:   12/20/23 1953 12/21/23 0346 12/21/23 0946 12/21/23 1505  BP: (!) 122/52 132/62 132/65 125/64  Pulse: (!) 110 62 69 80  Resp: 18 18 16 16   Temp: (!) 97.5 F (36.4 C) 97.9 F (36.6 C) 98.1 F (36.7 C) 97.6 F (36.4 C)  TempSrc: Oral Oral Oral   SpO2: 93% 94% 98% 93%  Weight:      Height:       No intake or output data in the 24 hours ending 12/21/23 1649  Filed Weights   09/04/23 1035 11/28/23 2010  Weight: 90.7 kg 90.7 kg    Examination:   Constitutional: NAD, alert, walking with walker HEENT: conjunctivae and lids normal, EOMI CV: No cyanosis.   RESP: normal respiratory effort, on RA Neuro: II - XII grossly intact.     Data Reviewed: I have personally reviewed labs and imaging studies  Time spent: 25 minutes  Tracy Priestly, MD [facility] If 7PM-7AM, please contact night-coverage 12/21/2023, 4:49 PM

## 2023-12-21 NOTE — Plan of Care (Signed)

## 2023-12-21 NOTE — Progress Notes (Signed)
 Mobility Specialist - Progress Note   12/21/23 1111  Mobility  Activity Ambulated independently in hallway  Level of Assistance Modified independent, requires aide device or extra time  Assistive Device Front wheel walker  Distance Ambulated (ft) 480 ft  Activity Response Tolerated well  Mobility Referral Yes  Mobility visit 1 Mobility  Mobility Specialist Start Time (ACUTE ONLY) 1101  Mobility Specialist Stop Time (ACUTE ONLY) 1111  Mobility Specialist Time Calculation (min) (ACUTE ONLY) 10 min   Pt sitting in recliner on RA upon arrival. Pt STS and ambulates 3 laps in hallway indep. Pt returns to recliner with needs in reach.   Terrilyn Saver  Mobility Specialist  12/21/23 11:12 AM

## 2023-12-22 DIAGNOSIS — J189 Pneumonia, unspecified organism: Secondary | ICD-10-CM | POA: Diagnosis not present

## 2023-12-22 NOTE — Plan of Care (Signed)

## 2023-12-22 NOTE — Progress Notes (Signed)
 Mobility Specialist - Progress Note   12/22/23 1029  Mobility  Activity Ambulated independently in hallway  Level of Assistance Modified independent, requires aide device or extra time  Assistive Device Front wheel walker  Distance Ambulated (ft) 320 ft  Activity Response Tolerated well  Mobility visit 1 Mobility  Mobility Specialist Start Time (ACUTE ONLY) 1004  Mobility Specialist Stop Time (ACUTE ONLY) 1016  Mobility Specialist Time Calculation (min) (ACUTE ONLY) 12 min   Zetta Bills Mobility Specialist 12/22/23 10:33 AM

## 2023-12-22 NOTE — Plan of Care (Signed)
  Problem: Nutrition: Goal: Adequate nutrition will be maintained Outcome: Progressing   Problem: Coping: Goal: Level of anxiety will decrease Outcome: Progressing   Problem: Elimination: Goal: Will not experience complications related to bowel motility Outcome: Progressing   Problem: Skin Integrity: Goal: Risk for impaired skin integrity will decrease Outcome: Progressing

## 2023-12-22 NOTE — Progress Notes (Signed)
  PROGRESS NOTE    Tracy Dickerson  [id number]:[id number] DOB: [birth year] DOA: 09/04/2023 PCP: Dwana Melena, PA  219A/219A-AA  LOS: 31 days   Brief hospital course:   Assessment & Plan: Tracy Dickerson is a 83 y.o. female with medical history significant of hypertension, hyperlipidemia, diabetes mellitus, GERD, breast cancer, CKD-3a, septic shock due to UTI, PAF on Eliquis, orthostatic hypotension on Cortef, presented with SOB   Patient has been in the ED for extended period of time since 09/04/23 due to agitation. Patient was evaluated by psychiatry, waiting for placement.  Per ED physician, morning of 11/21/23, patient appeared to be, weaker, developed cough, shortness of breath mild chest pain, found to have oxygen desaturation to 87% on room air, which improved to 95% on 2 L oxygen.  This is new oxygen requirement.  Pt therefore was admitted to the hospitalist service.   She is treated for atypical pneumonia.  Condition has improved.  Currently pending nursing home placement.   Acute hypoxemic respiratory failure 2/2 Atypical pneumonia, ILD (interstitial lung disease) and COPD (chronic obstructive pulmonary disease) (HCC):  Patient had 2 L new oxygen requirement.  No leukocytosis or fever.  Clinically not septic. --started on IV Rocephin and azithromycin and Solu-Medrol 80 daily.  Steroid d/c'ed. --on room air by 3/1 Antibiotics completed.  Patient improved.  Off oxygen.   HTN (hypertension):  Blood pressure not significant elevated.   HLD (hyperlipidemia) --cont statin   PAF (paroxysmal atrial fibrillation) (HCC):  --cont Eliquis   CKD stage 3a, GFR 45-59 ml/min [facility]): Renal function stable.   Orthostatic hypotension --cont Cortef   Lung nodule:  This is incidental findings by CT. -Need to follow-up with PCP as outpatient   Dementia with behavioral disturbance (HCC) Insomnia. -Fall precaution -Haldol prn for agitation  -Continue Seroquel at night   DVT prophylaxis:  [id number] Code Status: Full code  Family Communication:  Level of care: Telemetry Medical Dispo:   The patient is from: home Anticipated d/c is to: to be determined Anticipated d/c date is: to be detemrined   Subjective and Interval History:  No new event today.   Objective: Vitals:   12/21/23 1505 12/21/23 2026 12/22/23 0833 12/22/23 1541  BP: 125/64 137/64 120/62 (!) 149/71  Pulse: 80 88 63 70  Resp: 16 18 16 16   Temp: 97.6 F (36.4 C) (!) 97.5 F (36.4 C) 98.1 F (36.7 C) 97.7 F (36.5 C)  TempSrc:  Oral    SpO2: 93% 92% 93% 96%  Weight:      Height:        Intake/Output Summary (Last 24 hours) at 12/22/2023 1809 Last data filed at 12/22/2023 1721 Gross per 24 hour  Intake 840 ml  Output 1 ml  Net 839 ml    Filed Weights   09/04/23 1035 11/28/23 2010  Weight: 90.7 kg 90.7 kg    Examination:   Constitutional: NAD, sitting in recliner CV: No cyanosis.   RESP: normal respiratory effort, on RA   Data Reviewed: I have personally reviewed labs and imaging studies  Time spent: 25 minutes  Darlin Priestly, MD [facility] If 7PM-7AM, please contact night-coverage 12/22/2023, 6:09 PM

## 2023-12-23 DIAGNOSIS — J189 Pneumonia, unspecified organism: Secondary | ICD-10-CM | POA: Diagnosis not present

## 2023-12-23 NOTE — TOC Progression Note (Signed)
 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  We will need to review the FL2 since it has been a little while. Also need the copy of her TB test to review as well."  CSW sent PPD from 3/5 and updated FL2 to her in a secure email.   Expected Discharge Plan: Long Term Nursing Home Barriers to Discharge: Continued Medical Work up  Expected Discharge Plan and Services In-house Referral: Clinical Social Work Discharge Planning Services: NA Post Acute Care Choice: Skilled Nursing Facility Living arrangements for the past 2 months: Single Family Home (Unsafe to return)                   DME Agency: NA     Representative spoke with at DME Agency: N/A HH Arranged: NA           Social Determinants of Health (SDOH) Interventions SDOH Screenings   Food Insecurity: No Food Insecurity (11/21/2023)  Housing: Low Risk  (11/21/2023)  Transportation Needs: No Transportation Needs (11/21/2023)  Utilities: Not At Risk (11/21/2023)  Financial Resource Strain: Low Risk  (02/04/2023)   Received from [facility], [facility]  Physical Activity: Inactive (07/16/2021)   Received from [facility], [facility]  Social Connections: Patient Unable To Answer (11/21/2023)  Stress: No Stress Concern Present (07/16/2021)   Received from [facility], [facility]  Tobacco Use: Medium Risk (09/04/2023)  Health Literacy: Medium Risk (07/16/2021)   Received from [facility], [facility]    Readmission Risk Interventions     No data to display

## 2023-12-23 NOTE — Progress Notes (Signed)
  PROGRESS NOTE    TRIS HOWELL  ZOX:[id number] DOB: 1941/02/06 DOA: 09/04/2023 PCP: Dwana Melena, PA  219A/219A-AA  LOS: 32 days   Brief hospital course:   Assessment & Plan: Tracy Dickerson is a 83 y.o. female with medical history significant of hypertension, hyperlipidemia, diabetes mellitus, GERD, breast cancer, CKD-3a, septic shock due to UTI, PAF on Eliquis, orthostatic hypotension on Cortef, presented with SOB   Patient has been in the ED for extended period of time since 09/04/23 due to agitation. Patient was evaluated by psychiatry, waiting for placement.  Per ED physician, morning of 11/21/23, patient appeared to be, weaker, developed cough, shortness of breath mild chest pain, found to have oxygen desaturation to 87% on room air, which improved to 95% on 2 L oxygen.  This is new oxygen requirement.  Pt therefore was admitted to the hospitalist service.   She is treated for atypical pneumonia.  Condition has improved.  Currently pending nursing home placement.   Acute hypoxemic respiratory failure 2/2 Atypical pneumonia, ILD (interstitial lung disease) and COPD (chronic obstructive pulmonary disease) (HCC):  Patient had 2 L new oxygen requirement.  No leukocytosis or fever.  Clinically not septic. --started on IV Rocephin and azithromycin and Solu-Medrol 80 daily.  Steroid d/c'ed. --on room air by 3/1 Antibiotics completed.  Patient improved.  Off oxygen.   HTN (hypertension):  Blood pressure not significant elevated.   HLD (hyperlipidemia) --cont statin   PAF (paroxysmal atrial fibrillation) (HCC):  --cont Eliquis   CKD stage 3a, GFR 45-59 ml/min [facility]): Renal function stable.   Orthostatic hypotension --cont Cortef   Lung nodule:  This is incidental findings by CT. -Need to follow-up with PCP as outpatient   Dementia with behavioral disturbance (HCC) Insomnia. -Fall precaution -Haldol prn for agitation  -Continue Seroquel at night   DVT prophylaxis:  [id number] Code Status: Full code  Family Communication:  Level of care: Telemetry Medical Dispo:   The patient is from: home Anticipated d/c is to: to be determined Anticipated d/c date is: to be detemrined   Subjective and Interval History:  Pt reported taking her walks.  I reminded her not to walk past the double doors, she agreed.     Objective: Vitals:   12/22/23 1922 12/23/23 0316 12/23/23 0752 12/23/23 1522  BP: 130/66 125/60 138/67 (!) 126/56  Pulse: 86 65 61 69  Resp: 20 20 16 17   Temp: 97.7 F (36.5 C) (!) 97.5 F (36.4 C) 97.8 F (36.6 C) (!) 97.5 F (36.4 C)  TempSrc:  Oral Oral Oral  SpO2: 95% 93% 94% 96%  Weight:      Height:        Intake/Output Summary (Last 24 hours) at 12/23/2023 1757 Last data filed at 12/23/2023 1604 Gross per 24 hour  Intake 600 ml  Output --  Net 600 ml    Filed Weights   09/04/23 1035 11/28/23 2010  Weight: 90.7 kg 90.7 kg    Examination:   Constitutional: NAD, alert, oriented to person and place HEENT: conjunctivae and lids normal, EOMI CV: No cyanosis.   RESP: normal respiratory effort, on RA Neuro: II - XII grossly intact.   Psych: flat mood and affect.     Data Reviewed: I have personally reviewed labs and imaging studies  Time spent: 25 minutes  Darlin Priestly, MD [facility] If 7PM-7AM, please contact night-coverage 12/23/2023, 5:57 PM

## 2023-12-23 NOTE — Progress Notes (Signed)
 Mobility Specialist - Progress Note   12/23/23 1110  Mobility  Activity Ambulated independently in hallway  Level of Assistance Modified independent, requires aide device or extra time  Assistive Device Front wheel walker  Distance Ambulated (ft) 320 ft  Activity Response Tolerated well  Mobility visit 1 Mobility  Mobility Specialist Start Time (ACUTE ONLY) 1031  Mobility Specialist Stop Time (ACUTE ONLY) 1040  Mobility Specialist Time Calculation (min) (ACUTE ONLY) 9 min   Zetta Bills Mobility Specialist 12/23/23 11:11 AM

## 2023-12-23 NOTE — [place] FL2 (Signed)
 [place] MEDICAID FL2 LEVEL OF CARE FORM     IDENTIFICATION  Patient Name: Tracy Dickerson Birthdate: 08-17-1941 Sex: female Admission Date (Current Location): 09/04/2023  [place] and [place] Number:  Chiropodist and Address:  [facility], [address], [place], [place] [address]      Provider Number: [id number]  Attending Physician Name and Address:  Darlin Priestly, MD  Relative Name and Phone Number:       Current Level of Care: Hospital Recommended Level of Care: Memory Care Prior Approval Number:    Date Approved/Denied:   PASRR Number:    Discharge Plan: Other (Comment) (Memory Care)    Current Diagnoses: Patient Active Problem List   Diagnosis Date Noted   Atypical pneumonia 11/21/2023   ILD (interstitial lung disease) (HCC) 11/21/2023   Dementia with behavioral disturbance (HCC) 11/21/2023   Low serum cortisol level 09/02/2023   Headache 08/30/2023   Vitamin D deficiency 08/28/2023   Dizziness 08/25/2023   Iron deficiency anemia 08/23/2023   Iron deficiency anemia due to chronic blood loss 08/21/2023   Syncope 08/20/2023   Normocytic anemia 08/20/2023   Obesity (BMI 30-39.9) 08/20/2023   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/2023   Vitamin B12 deficiency 06/20/2023   Carotid stenosis 06/20/2023   Dementia (HCC) 06/19/2023   Generalized weakness 06/18/2023   Acute on chronic anemia 06/18/2023   H/O: upper GI bleed 06/18/2023   Chronic anticoagulation 06/18/2023   Infestation by bed bug 06/18/2023   CKD stage 3a, GFR 45-59 ml/min (HCC) 01/07/2023   Overweight (BMI 25.0-29.9) 01/06/2023   Type 2 diabetes mellitus with hyperlipidemia (HCC) 01/06/2023   COPD (chronic obstructive pulmonary disease) (HCC) 01/06/2023   Hypokalemia 01/06/2023   UGIB (upper gastrointestinal bleed) 01/05/2023   Unsatisfactory living conditions 01/05/2023   Constipation    UTI (urinary tract infection) 08/13/2020   HLD  (hyperlipidemia) 08/13/2020   Acute renal failure superimposed on stage 3a chronic kidney disease (HCC) 08/13/2020   PAF (paroxysmal atrial fibrillation) (HCC) 08/13/2020   HTN (hypertension) 08/13/2020   Dehydration    Dehydration, moderate    Acute renal failure (HCC) 04/22/2020   Hyperkalemia    Septic shock (HCC)    Sepsis due to urinary tract infection (HCC) 03/13/2018   Acute kidney injury superimposed on chronic kidney disease (HCC) 02/13/2018   Closed extra-articular fracture of distal tibia 02/02/2018   Community acquired pneumonia 10/29/2017   Orthostatic hypotension 01/26/2017   Lung nodule 05/30/2014   Sleep apnea 05/10/2014   Barrett's esophagus 05/13/2007   Malignant neoplasm of breast (female) (HCC) 06/01/1997    Orientation RESPIRATION BLADDER Height & Weight     Self, Place  Normal Continent Weight: 200 lb (90.7 kg) Height:  5\' 6"  (167.6 cm)  BEHAVIORAL SYMPTOMS/MOOD NEUROLOGICAL BOWEL NUTRITION STATUS   (Irritable at times)  (Dementia) Continent Diet (Regular)  AMBULATORY STATUS COMMUNICATION OF NEEDS Skin   Supervision Verbally Bruising                       Personal Care Assistance Level of Assistance  Bathing, Feeding, Dressing Bathing Assistance: Limited assistance Feeding assistance: Limited assistance Dressing Assistance: Limited assistance     Functional Limitations Info  Sight, Hearing, Speech Sight Info: Adequate Hearing Info: Adequate Speech Info: Adequate    SPECIAL CARE FACTORS FREQUENCY                       Contractures  Contractures Info: Not present    Additional Factors Info  Code Status, Allergies Code Status Info: Full code Allergies Info: Oxycodone, Percocet (Oxycodone-acetaminophen), Penicillins           Current Medications (12/23/2023):  This is the current hospital active medication list Current Facility-Administered Medications  Medication Dose Route Frequency Provider Last Rate Last Admin    acetaminophen (TYLENOL) tablet 650 mg  650 mg Oral Q6H PRN Lorretta Harp, MD   650 mg at 12/10/23 2215   apixaban (ELIQUIS) tablet 5 mg  5 mg Oral BID Concha Se, MD   5 mg at 12/23/23 6578   atorvastatin (LIPITOR) tablet 20 mg  20 mg Oral Daily Concha Se, MD   20 mg at 12/23/23 4696   bisacodyl (DULCOLAX) suppository 10 mg  10 mg Rectal Daily PRN Concha Se, MD   10 mg at 10/24/23 2349   cyanocobalamin (VITAMIN B12) tablet 1,000 mcg  1,000 mcg Oral Daily Lorretta Harp, MD   1,000 mcg at 12/23/23 2952   dextromethorphan-guaiFENesin (MUCINEX DM) 30-600 MG per 12 hr tablet 1 tablet  1 tablet Oral BID PRN Lorretta Harp, MD       folic acid (FOLVITE) tablet 1 mg  1 mg Oral Daily Lorretta Harp, MD   1 mg at 12/23/23 8413   gabapentin (NEURONTIN) capsule 600 mg  600 mg Oral QHS Concha Se, MD   600 mg at 12/22/23 2022   hydrocortisone (CORTEF) tablet 5 mg  5 mg Oral Daily Concha Se, MD   5 mg at 12/23/23 2440   hydrOXYzine (ATARAX) tablet 10 mg  10 mg Oral TID PRN Concha Se, MD       ipratropium-albuterol (DUONEB) 0.5-2.5 (3) MG/3ML nebulizer solution 3 mL  3 mL Nebulization Q4H PRN Darlin Priestly, MD       meclizine (ANTIVERT) tablet 12.5 mg  12.5 mg Oral TID PRN Lorretta Harp, MD       melatonin tablet 5 mg  5 mg Oral QHS Corena Herter, MD   5 mg at 12/22/23 2022   mometasone-formoterol (DULERA) 200-5 MCG/ACT inhaler 2 puff  2 puff Inhalation BID Lorretta Harp, MD   2 puff at 12/23/23 0837   ondansetron (ZOFRAN-ODT) disintegrating tablet 4 mg  4 mg Oral Q8H PRN Concha Se, MD   4 mg at 10/31/23 2143   pantoprazole (PROTONIX) EC tablet 40 mg  40 mg Oral BID Concha Se, MD   40 mg at 12/23/23 1027   polyethylene glycol (MIRALAX / GLYCOLAX) packet 17 g  17 g Oral Daily PRN Marrion Coy, MD   17 g at 11/29/23 1555   QUEtiapine (SEROQUEL) tablet 25 mg  25 mg Oral QHS Marrion Coy, MD   25 mg at 12/22/23 2022   senna-docusate (Senokot-S) tablet 2 tablet  2 tablet Oral BID Marrion Coy, MD   2 tablet at  12/23/23 2536   traMADol (ULTRAM) tablet 50 mg  50 mg Oral BID PRN Lorretta Harp, MD         Discharge Medications: Please see discharge summary for a list of discharge medications.  Relevant Imaging Results:  Relevant Lab Results:   Additional Information SS#: [id number]  Margarito Liner, LCSW

## 2023-12-24 DIAGNOSIS — J189 Pneumonia, unspecified organism: Secondary | ICD-10-CM | POA: Diagnosis not present

## 2023-12-24 NOTE — Plan of Care (Signed)
   Problem: Activity: Goal: Risk for activity intolerance will decrease Outcome: Progressing

## 2023-12-24 NOTE — Progress Notes (Signed)
 PROGRESS NOTE    FRANCISCO EYERLY  [name] DOB: 1940/10/16 DOA: 09/04/2023 PCP: Dwana Melena, PA    Brief Narrative:   Tracy Dickerson is a 83 y.o. female with medical history significant of hypertension, hyperlipidemia, diabetes mellitus, GERD, breast cancer, CKD-3a, septic shock due to UTI, PAF on Eliquis, orthostatic hypotension on Cortef, presented with SOB   Patient has been in the ED for extended period of time since 09/04/23 due to agitation. Patient was evaluated by psychiatry, waiting for placement.  Per ED physician, morning of 11/21/23, patient appeared to be, weaker, developed cough, shortness of breath mild chest pain, found to have oxygen desaturation to 87% on room air, which improved to 95% on 2 L oxygen.  This is new oxygen requirement.  Pt therefore was admitted to the hospitalist service.   She is treated for atypical pneumonia.  Condition has improved.  Currently pending nursing home placement.   Assessment & Plan:   Principal Problem:   Atypical pneumonia Active Problems:   ILD (interstitial lung disease) (HCC)   COPD (chronic obstructive pulmonary disease) (HCC)   HTN (hypertension)   HLD (hyperlipidemia)   PAF (paroxysmal atrial fibrillation) (HCC)   CKD stage 3a, GFR 45-59 ml/min (HCC)   Orthostatic hypotension   Lung nodule   Dementia with behavioral disturbance (HCC)   Acute hypoxemic respiratory failure 2/2 Atypical pneumonia, ILD (interstitial lung disease) and COPD (chronic obstructive pulmonary disease) (HCC):  Patient had 2 L new oxygen requirement.  No leukocytosis or fever.  Clinically not septic. --started on IV Rocephin and azithromycin and Solu-Medrol 80 daily.  Steroid d/c'ed. --on room air by 3/1 Antibiotics completed.  Patient improved.  Off oxygen.   HTN (hypertension):  As needed therapy only   HLD (hyperlipidemia) --cont statin   PAF (paroxysmal atrial fibrillation) (HCC):  --cont Eliquis   CKD stage 3a, GFR 45-59 ml/min  [facility]): Renal function stable.   Orthostatic hypotension --cont Cortef   Lung nodule:  This is incidental findings by CT. -Need to follow-up with PCP as outpatient   Dementia with behavioral disturbance (HCC) Insomnia. -Fall precaution -Haldol prn for agitation  -Continue Seroquel at night     DVT prophylaxis: Eliquis Code Status: Full Family Communication: None Disposition Plan: Status is: Inpatient Remains inpatient appropriate because: Unsafe discharge plan   Level of care: Telemetry Medical  Consultants:  None  Procedures:  None  Antimicrobials: None   Subjective: Seen and examined.  Sitting up in chair.  No visible distress.  Objective: Vitals:   12/23/23 0752 12/23/23 1522 12/23/23 2033 12/24/23 0346  BP: 138/67 (!) 126/56 (!) 123/48 117/64  Pulse: 61 69 86 65  Resp: 16 17 16 20   Temp: 97.8 F (36.6 C) (!) 97.5 F (36.4 C) 98.4 F (36.9 C) (!) 97.5 F (36.4 C)  TempSrc: Oral Oral    SpO2: 94% 96% 95% 94%  Weight:      Height:        Intake/Output Summary (Last 24 hours) at 12/24/2023 1234 Last data filed at 12/24/2023 1029 Gross per 24 hour  Intake 240 ml  Output --  Net 240 ml   Filed Weights   09/04/23 1035 11/28/23 2010  Weight: 90.7 kg 90.7 kg    Examination:  General exam: Appears calm and comfortable  Respiratory system: Clear to auscultation. Respiratory effort normal. Cardiovascular system: S1-S2 RRR, no murmurs, no pedal edema Gastrointestinal system: Soft, NT/ND, normal bowel sounds. Central nervous system: Alert and oriented x 2. No  focal neurological deficits. Extremities: Symmetric 5 x 5 power. Skin: No rashes, lesions or ulcers Psychiatry: Judgement and insight appear normal. Mood & affect flattened.     Data Reviewed: I have personally reviewed following labs and imaging studies  CBC: Recent Labs  Lab 12/20/23 0530  WBC 5.6  HGB 10.1*  HCT 28.0*  MCV 96.6  PLT 158   Basic Metabolic Panel: Recent Labs  Lab  12/20/23 0530  NA 140  K 3.8  CL 108  CO2 26  GLUCOSE 85  BUN 23  CREATININE 0.98  CALCIUM 8.8*  MG 2.3   GFR: Estimated Creatinine Clearance: 52 mL/min (by C-G formula based on SCr of 0.98 mg/dL). Liver Function Tests: No results for input(s): "AST", "ALT", "ALKPHOS", "BILITOT", "PROT", "ALBUMIN" in the last 168 hours. No results for input(s): "LIPASE", "AMYLASE" in the last 168 hours. No results for input(s): "AMMONIA" in the last 168 hours. Coagulation Profile: No results for input(s): "INR", "PROTIME" in the last 168 hours. Cardiac Enzymes: No results for input(s): "CKTOTAL", "CKMB", "CKMBINDEX", "TROPONINI" in the last 168 hours. BNP (last 3 results) No results for input(s): "PROBNP" in the last 8760 hours. HbA1C: No results for input(s): "HGBA1C" in the last 72 hours. CBG: No results for input(s): "GLUCAP" in the last 168 hours. Lipid Profile: No results for input(s): "CHOL", "HDL", "LDLCALC", "TRIG", "CHOLHDL", "LDLDIRECT" in the last 72 hours. Thyroid Function Tests: No results for input(s): "TSH", "T4TOTAL", "FREET4", "T3FREE", "THYROIDAB" in the last 72 hours. Anemia Panel: No results for input(s): "VITAMINB12", "FOLATE", "FERRITIN", "TIBC", "IRON", "RETICCTPCT" in the last 72 hours. Sepsis Labs: No results for input(s): "PROCALCITON", "LATICACIDVEN" in the last 168 hours.  No results found for this or any previous visit (from the past 240 hours).       Radiology Studies: No results found.      Scheduled Meds:  apixaban  5 mg Oral BID   atorvastatin  20 mg Oral Daily   cyanocobalamin  1,000 mcg Oral Daily   folic acid  1 mg Oral Daily   gabapentin  600 mg Oral QHS   hydrocortisone  5 mg Oral Daily   melatonin  5 mg Oral QHS   mometasone-formoterol  2 puff Inhalation BID   pantoprazole  40 mg Oral BID   QUEtiapine  25 mg Oral QHS   senna-docusate  2 tablet Oral BID   Continuous Infusions:   LOS: 33 days     Tresa Moore, MD [facility]   If 7PM-7AM, please contact night-coverage  12/24/2023, 12:34 PM

## 2023-12-24 NOTE — Plan of Care (Signed)
  Problem: Activity: Goal: Risk for activity intolerance will decrease 12/24/2023 0715 by Loraine Maple, RN Outcome: Progressing 12/24/2023 0714 by Loraine Maple, RN Outcome: Progressing

## 2023-12-24 NOTE — Progress Notes (Addendum)
 Mobility Specialist - Progress Note   12/24/23 1158  Mobility  Activity Ambulated independently in hallway  Level of Assistance Modified independent, requires aide device or extra time  Assistive Device Front wheel walker  Distance Ambulated (ft) 1000 ft  Activity Response Tolerated well  Mobility visit 1 Mobility  Mobility Specialist Start Time (ACUTE ONLY) 1125  Mobility Specialist Stop Time (ACUTE ONLY) 1145  Mobility Specialist Time Calculation (min) (ACUTE ONLY) 20 min   Pt amb to the medical mall and outside for leisure. Pt returned to the unit, left seated in the recliner with alarm set and needs within reach.  Zetta Bills Mobility Specialist 12/24/23 3:34 PM

## 2023-12-25 DIAGNOSIS — J189 Pneumonia, unspecified organism: Secondary | ICD-10-CM | POA: Diagnosis not present

## 2023-12-25 NOTE — Plan of Care (Signed)
   Problem: Coping: Goal: Level of anxiety will decrease Outcome: Progressing

## 2023-12-25 NOTE — Progress Notes (Signed)
 PROGRESS NOTE    Tracy Dickerson  WUJ:[id number] DOB: [birth year] DOA: 09/04/2023 PCP: Dwana Melena, PA    Brief Narrative:   Tracy Dickerson is a 83 y.o. female with medical history significant of hypertension, hyperlipidemia, diabetes mellitus, GERD, breast cancer, CKD-3a, septic shock due to UTI, PAF on Eliquis, orthostatic hypotension on Cortef, presented with SOB   Patient has been in the ED for extended period of time since 09/04/23 due to agitation. Patient was evaluated by psychiatry, waiting for placement.  Per ED physician, morning of 11/21/23, patient appeared to be, weaker, developed cough, shortness of breath mild chest pain, found to have oxygen desaturation to 87% on room air, which improved to 95% on 2 L oxygen.  This is new oxygen requirement.  Pt therefore was admitted to the hospitalist service.   She is treated for atypical pneumonia.  Condition has improved.  Currently pending nursing home placement.   Assessment & Plan:   Principal Problem:   Atypical pneumonia Active Problems:   ILD (interstitial lung disease) (HCC)   COPD (chronic obstructive pulmonary disease) (HCC)   HTN (hypertension)   HLD (hyperlipidemia)   PAF (paroxysmal atrial fibrillation) (HCC)   CKD stage 3a, GFR 45-59 ml/min (HCC)   Orthostatic hypotension   Lung nodule   Dementia with behavioral disturbance (HCC)   Acute hypoxemic respiratory failure 2/2 Atypical pneumonia, ILD (interstitial lung disease) and COPD (chronic obstructive pulmonary disease) (HCC):  Patient had 2 L new oxygen requirement.  No leukocytosis or fever.  Clinically not septic. --started on IV Rocephin and azithromycin and Solu-Medrol 80 daily.  Steroid d/c'ed. --on room air by 3/1 Antibiotics completed.  Patient improved.  Off oxygen. Monitor vitals and fever curve   HTN (hypertension):  As needed therapy only   HLD (hyperlipidemia) --cont statin   PAF (paroxysmal atrial fibrillation) (HCC):  --cont Eliquis    CKD stage 3a, GFR 45-59 ml/min [facility]): Renal function stable.   Orthostatic hypotension --cont Cortef   Lung nodule:  This is incidental findings by CT. -Need to follow-up with PCP as outpatient   Dementia with behavioral disturbance (HCC) Insomnia. -Fall precaution -Haldol prn for agitation  -Continue Seroquel at night     DVT prophylaxis: Eliquis Code Status: Full Family Communication: None Disposition Plan: Status is: Inpatient Remains inpatient appropriate because: Unsafe discharge plan   Level of care: Telemetry Medical  Consultants:  None  Procedures:  None  Antimicrobials: None   Subjective: Seen and examined.  Resting comfortably in chair.  No physical distress.  No complaints of pain.  Objective: Vitals:   12/24/23 1540 12/24/23 2011 12/25/23 0345 12/25/23 0806  BP: 139/66 134/69 (!) 103/55 (!) 146/62  Pulse: 69 80 (!) 59 (!) 55  Resp: 17 18 18 18   Temp:  97.7 F (36.5 C) 97.6 F (36.4 C) 97.7 F (36.5 C)  TempSrc:      SpO2: 99% 94% 96% 98%  Weight:      Height:        Intake/Output Summary (Last 24 hours) at 12/25/2023 1216 Last data filed at 12/25/2023 0900 Gross per 24 hour  Intake 440 ml  Output --  Net 440 ml   Filed Weights   09/04/23 1035 11/28/23 2010  Weight: 90.7 kg 90.7 kg    Examination:  General exam: NAD Respiratory system: Clear to auscultation. Respiratory effort normal. Cardiovascular system: S1-S2 RRR, no murmurs, no pedal edema Gastrointestinal system: Soft, NT/ND, normal bowel sounds. Central nervous system: Alert and oriented  x 2. No focal neurological deficits. Extremities: Symmetric 5 x 5 power. Skin: No rashes, lesions or ulcers Psychiatry: Judgement and insight appear normal. Mood & affect flattened.     Data Reviewed: I have personally reviewed following labs and imaging studies  CBC: Recent Labs  Lab 12/20/23 0530  WBC 5.6  HGB 10.1*  HCT 28.0*  MCV 96.6  PLT 158   Basic Metabolic  Panel: Recent Labs  Lab 12/20/23 0530  NA 140  K 3.8  CL 108  CO2 26  GLUCOSE 85  BUN 23  CREATININE 0.98  CALCIUM 8.8*  MG 2.3   GFR: Estimated Creatinine Clearance: 52 mL/min (by C-G formula based on SCr of 0.98 mg/dL). Liver Function Tests: No results for input(s): "AST", "ALT", "ALKPHOS", "BILITOT", "PROT", "ALBUMIN" in the last 168 hours. No results for input(s): "LIPASE", "AMYLASE" in the last 168 hours. No results for input(s): "AMMONIA" in the last 168 hours. Coagulation Profile: No results for input(s): "INR", "PROTIME" in the last 168 hours. Cardiac Enzymes: No results for input(s): "CKTOTAL", "CKMB", "CKMBINDEX", "TROPONINI" in the last 168 hours. BNP (last 3 results) No results for input(s): "PROBNP" in the last 8760 hours. HbA1C: No results for input(s): "HGBA1C" in the last 72 hours. CBG: No results for input(s): "GLUCAP" in the last 168 hours. Lipid Profile: No results for input(s): "CHOL", "HDL", "LDLCALC", "TRIG", "CHOLHDL", "LDLDIRECT" in the last 72 hours. Thyroid Function Tests: No results for input(s): "TSH", "T4TOTAL", "FREET4", "T3FREE", "THYROIDAB" in the last 72 hours. Anemia Panel: No results for input(s): "VITAMINB12", "FOLATE", "FERRITIN", "TIBC", "IRON", "RETICCTPCT" in the last 72 hours. Sepsis Labs: No results for input(s): "PROCALCITON", "LATICACIDVEN" in the last 168 hours.  No results found for this or any previous visit (from the past 240 hours).       Radiology Studies: No results found.      Scheduled Meds:  apixaban  5 mg Oral BID   atorvastatin  20 mg Oral Daily   cyanocobalamin  1,000 mcg Oral Daily   folic acid  1 mg Oral Daily   gabapentin  600 mg Oral QHS   hydrocortisone  5 mg Oral Daily   melatonin  5 mg Oral QHS   mometasone-formoterol  2 puff Inhalation BID   pantoprazole  40 mg Oral BID   QUEtiapine  25 mg Oral QHS   senna-docusate  2 tablet Oral BID   Continuous Infusions:   LOS: 34 days      Tresa Moore, MD [facility]   If 7PM-7AM, please contact night-coverage  12/25/2023, 12:16 PM

## 2023-12-25 NOTE — Progress Notes (Signed)
 Mobility Specialist - Progress Note   12/25/23 0942  Mobility  Activity Ambulated independently in hallway  Level of Assistance Modified independent, requires aide device or extra time  Assistive Device Front wheel walker  Distance Ambulated (ft) 320 ft  Activity Response Tolerated well  Mobility visit 1 Mobility  Mobility Specialist Start Time (ACUTE ONLY) [id number]  Mobility Specialist Stop Time (ACUTE ONLY) 0938  Mobility Specialist Time Calculation (min) (ACUTE ONLY) 10 min   Zetta Bills Mobility Specialist 12/25/23 9:42 AM

## 2023-12-26 DIAGNOSIS — J189 Pneumonia, unspecified organism: Secondary | ICD-10-CM | POA: Diagnosis not present

## 2023-12-26 MED ORDER — CLONAZEPAM 0.5 MG PO TABS
0.5000 mg | ORAL_TABLET | Freq: Two times a day (BID) | ORAL | Status: DC | PRN
  Administered 2023-12-31: 0.5 mg via ORAL
  Filled 2023-12-26: qty 1

## 2023-12-26 MED ORDER — HYDROXYZINE HCL 10 MG PO TABS: 10.0000 mg | ORAL_TABLET | Freq: Three times a day (TID) | ORAL | Status: DC | PRN

## 2023-12-26 NOTE — Plan of Care (Signed)

## 2023-12-26 NOTE — Progress Notes (Signed)
 PROGRESS NOTE    Tracy Dickerson  UEA:[id number] DOB: February 27, 1941 DOA: 09/04/2023 PCP: Dwana Melena, PA    Brief Narrative:   Tracy Dickerson is a 83 y.o. female with medical history significant of hypertension, hyperlipidemia, diabetes mellitus, GERD, breast cancer, CKD-3a, septic shock due to UTI, PAF on Eliquis, orthostatic hypotension on Cortef, presented with SOB   Patient has been in the ED for extended period of time since 09/04/23 due to agitation. Patient was evaluated by psychiatry, waiting for placement.  Per ED physician, morning of 11/21/23, patient appeared to be, weaker, developed cough, shortness of breath mild chest pain, found to have oxygen desaturation to 87% on room air, which improved to 95% on 2 L oxygen.  This is new oxygen requirement.  Pt therefore was admitted to the hospitalist service.   She is treated for atypical pneumonia.  Condition has improved.  Currently pending nursing home placement.   Assessment & Plan:   Principal Problem:   Atypical pneumonia Active Problems:   ILD (interstitial lung disease) (HCC)   COPD (chronic obstructive pulmonary disease) (HCC)   HTN (hypertension)   HLD (hyperlipidemia)   PAF (paroxysmal atrial fibrillation) (HCC)   CKD stage 3a, GFR 45-59 ml/min (HCC)   Orthostatic hypotension   Lung nodule   Dementia with behavioral disturbance (HCC)   Acute hypoxemic respiratory failure 2/2 Atypical pneumonia, ILD (interstitial lung disease) and COPD (chronic obstructive pulmonary disease) (HCC):  Patient had 2 L new oxygen requirement.  No leukocytosis or fever.  Clinically not septic. --started on IV Rocephin and azithromycin and Solu-Medrol 80 daily.  Steroid d/c'ed. --on room air by 3/1 Antibiotics completed.  Patient improved.  Off oxygen. 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   Orthostatic hypotension --cont Cortef   Lung nodule:  This is incidental findings by CT. -Need to follow-up with PCP as outpatient   Dementia with behavioral disturbance (HCC) Insomnia. -Fall precaution -Haldol prn for agitation  -Continue Seroquel at night  Long length of stay Pending DSS and guardianship     DVT prophylaxis: Eliquis Code Status: Full Family Communication: None Disposition Plan: Status is: Inpatient Remains inpatient appropriate because: Unsafe discharge plan   Level of care: Telemetry Medical  Consultants:  None  Procedures:  None  Antimicrobials: None   Subjective: Seen and examined.  Resting comfortably in the chair. appears in no visible distress.  No complaints of pain   Objective: Vitals:   12/25/23 1600 12/25/23 2002 12/26/23 0243 12/26/23 0827  BP: 138/67 (!) 119/56 (!) 102/48 (!) 100/53  Pulse: 65 82 63 72  Resp: 19 20 17 16   Temp: 97.6 F (36.4 C) 98.1 F (36.7 C) 98 F (36.7 C) 97.8 F (36.6 C)  TempSrc:  Oral  Oral  SpO2: 98% 95% 94% 97%  Weight:      Height:        Intake/Output Summary (Last 24 hours) at 12/26/2023 1050 Last data filed at 12/26/2023 0830 Gross per 24 hour  Intake 240 ml  Output --  Net 240 ml   Filed Weights   09/04/23 1035 11/28/23 2010  Weight: 90.7 kg 90.7 kg    Examination:  General exam: NAD Respiratory system: Clear to auscultation. Respiratory effort normal. Cardiovascular system: S1-S2 RRR, no murmurs, no pedal  edema Gastrointestinal system: Soft, NT/ND, normal bowel sounds. Central nervous system: Alert and oriented x 2. No focal neurological deficits. Extremities: Symmetric 5 x 5 power. Skin: No rashes, lesions or ulcers Psychiatry: Judgement and insight appear normal. Mood & affect flattened.     Data Reviewed: I have personally reviewed following labs and imaging studies  CBC: Recent Labs  Lab 12/20/23 0530   WBC 5.6  HGB 10.1*  HCT 28.0*  MCV 96.6  PLT 158   Basic Metabolic Panel: Recent Labs  Lab 12/20/23 0530  NA 140  K 3.8  CL 108  CO2 26  GLUCOSE 85  BUN 23  CREATININE 0.98  CALCIUM 8.8*  MG 2.3   GFR: Estimated Creatinine Clearance: 52 mL/min (by C-G formula based on SCr of 0.98 mg/dL). Liver Function Tests: No results for input(s): "AST", "ALT", "ALKPHOS", "BILITOT", "PROT", "ALBUMIN" in the last 168 hours. No results for input(s): "LIPASE", "AMYLASE" in the last 168 hours. No results for input(s): "AMMONIA" in the last 168 hours. Coagulation Profile: No results for input(s): "INR", "PROTIME" in the last 168 hours. Cardiac Enzymes: No results for input(s): "CKTOTAL", "CKMB", "CKMBINDEX", "TROPONINI" in the last 168 hours. BNP (last 3 results) No results for input(s): "PROBNP" in the last 8760 hours. HbA1C: No results for input(s): "HGBA1C" in the last 72 hours. CBG: No results for input(s): "GLUCAP" in the last 168 hours. Lipid Profile: No results for input(s): "CHOL", "HDL", "LDLCALC", "TRIG", "CHOLHDL", "LDLDIRECT" in the last 72 hours. Thyroid Function Tests: No results for input(s): "TSH", "T4TOTAL", "FREET4", "T3FREE", "THYROIDAB" in the last 72 hours. Anemia Panel: No results for input(s): "VITAMINB12", "FOLATE", "FERRITIN", "TIBC", "IRON", "RETICCTPCT" in the last 72 hours. Sepsis Labs: No results for input(s): "PROCALCITON", "LATICACIDVEN" in the last 168 hours.  No results found for this or any previous visit (from the past 240 hours).       Radiology Studies: No results found.      Scheduled Meds:  apixaban  5 mg Oral BID   atorvastatin  20 mg Oral Daily   cyanocobalamin  1,000 mcg Oral Daily   folic acid  1 mg Oral Daily   gabapentin  600 mg Oral QHS   hydrocortisone  5 mg Oral Daily   melatonin  5 mg Oral QHS   mometasone-formoterol  2 puff Inhalation BID   pantoprazole  40 mg Oral BID   QUEtiapine  25 mg Oral QHS   senna-docusate   2 tablet Oral BID   Continuous Infusions:   LOS: 35 days     Tresa Moore, MD [facility]   If 7PM-7AM, please contact night-coverage  12/26/2023, 10:50 AM

## 2023-12-27 DIAGNOSIS — J189 Pneumonia, unspecified organism: Secondary | ICD-10-CM | POA: Diagnosis not present

## 2023-12-27 LAB — CBC
HCT: 30.5 % — ABNORMAL LOW (ref 36.0–46.0)
Hemoglobin: 10.1 g/dL — ABNORMAL LOW (ref 12.0–15.0)
MCH: 30.5 pg (ref 26.0–34.0)
MCHC: 33.1 g/dL (ref 30.0–36.0)
MCV: 92.1 fL (ref 80.0–100.0)
Platelets: 152 10*3/uL (ref 150–400)
RBC: 3.31 MIL/uL — ABNORMAL LOW (ref 3.87–5.11)
RDW: 14.6 % (ref 11.5–15.5)
WBC: 6.6 10*3/uL (ref 4.0–10.5)
nRBC: 0 % (ref 0.0–0.2)

## 2023-12-27 LAB — CREATININE, SERUM
Creatinine, Ser: 0.97 mg/dL (ref 0.44–1.00)
GFR, Estimated: 59 mL/min — ABNORMAL LOW (ref >60)

## 2023-12-27 NOTE — Progress Notes (Signed)
 PROGRESS NOTE    Tracy Dickerson  [name] DOB: April 10, 1941 DOA: 09/04/2023 PCP: Dwana Melena, PA    Brief Narrative:   Tracy Dickerson is a 83 y.o. female with medical history significant of hypertension, hyperlipidemia, diabetes mellitus, GERD, breast cancer, CKD-3a, septic shock due to UTI, PAF on Eliquis, orthostatic hypotension on Cortef, presented with SOB   Patient has been in the ED for extended period of time since 09/04/23 due to agitation. Patient was evaluated by psychiatry, waiting for placement.  Per ED physician, morning of 11/21/23, patient appeared to be, weaker, developed cough, shortness of breath mild chest pain, found to have oxygen desaturation to 87% on room air, which improved to 95% on 2 L oxygen.  This is new oxygen requirement.  Pt therefore was admitted to the hospitalist service.   She is treated for atypical pneumonia.  Condition has improved.  Currently pending nursing home placement.   Assessment & Plan:   Principal Problem:   Atypical pneumonia Active Problems:   ILD (interstitial lung disease) (HCC)   COPD (chronic obstructive pulmonary disease) (HCC)   HTN (hypertension)   HLD (hyperlipidemia)   PAF (paroxysmal atrial fibrillation) (HCC)   CKD stage 3a, GFR 45-59 ml/min (HCC)   Orthostatic hypotension   Lung nodule   Dementia with behavioral disturbance (HCC)   Acute hypoxemic respiratory failure 2/2 Atypical pneumonia, ILD (interstitial lung disease) and COPD (chronic obstructive pulmonary disease) (HCC):  Patient had 2 L new oxygen requirement.  No leukocytosis or fever.  Clinically not septic. --started on IV Rocephin and azithromycin and Solu-Medrol 80 daily.  Steroid d/c'ed. --on room air by 3/1 Antibiotics completed.  Patient improved.  Off oxygen. Remained stable on room air   HTN (hypertension):  As needed therapy only   HLD (hyperlipidemia) --cont statin   PAF (paroxysmal atrial fibrillation) (HCC):  --cont Eliquis    CKD stage 3a, GFR 45-59 ml/min [facility]): Renal function stable.   Orthostatic hypotension --cont Cortef   Lung nodule:  This is incidental findings by CT. -Need to follow-up with PCP as outpatient   Dementia with behavioral disturbance (HCC) Insomnia. -Fall precaution -Haldol prn for agitation  -Continue Seroquel at night  Long length of stay Pending DSS and guardianship     DVT prophylaxis: Eliquis Code Status: Full Family Communication: None Disposition Plan: Status is: Inpatient Remains inpatient appropriate because: Unsafe discharge plan   Level of care: Telemetry Medical  Consultants:  None  Procedures:  None  Antimicrobials: None   Subjective: Seen and examined.  Pleasant.  Sitting up in chair eating.  No visible distress. Objective: Vitals:   12/26/23 1954 12/27/23 0402 12/27/23 0403 12/27/23 0807  BP: 128/64 121/61 121/61 121/67  Pulse: 88 63 61 64  Resp: 20 20 20 19   Temp: 97.6 F (36.4 C) 98.7 F (37.1 C) 98.7 F (37.1 C) 98 F (36.7 C)  TempSrc: Oral     SpO2: 96% 94% 95% 97%  Weight:      Height:        Intake/Output Summary (Last 24 hours) at 12/27/2023 1131 Last data filed at 12/27/2023 1045 Gross per 24 hour  Intake 240 ml  Output --  Net 240 ml   Filed Weights   09/04/23 1035 11/28/23 2010  Weight: 90.7 kg 90.7 kg    Examination:  General exam: NAD Respiratory system: Clear to auscultation. Respiratory effort normal. Cardiovascular system: S1-S2 RRR, no murmurs, no pedal edema Gastrointestinal system: Soft, NT/ND, normal bowel sounds. Central  nervous system: Alert and oriented x 2. No focal neurological deficits. Extremities: Symmetric 5 x 5 power. Skin: No rashes, lesions or ulcers Psychiatry: Judgement and insight appear normal. Mood & affect flattened.     Data Reviewed: I have personally reviewed following labs and imaging studies  CBC: Recent Labs  Lab 12/27/23 0437  WBC 6.6  HGB 10.1*  HCT 30.5*  MCV 92.1   PLT 152   Basic Metabolic Panel: Recent Labs  Lab 12/27/23 0437  CREATININE 0.97   GFR: Estimated Creatinine Clearance: 52.5 mL/min (by C-G formula based on SCr of 0.97 mg/dL). Liver Function Tests: No results for input(s): "AST", "ALT", "ALKPHOS", "BILITOT", "PROT", "ALBUMIN" in the last 168 hours. No results for input(s): "LIPASE", "AMYLASE" in the last 168 hours. No results for input(s): "AMMONIA" in the last 168 hours. Coagulation Profile: No results for input(s): "INR", "PROTIME" in the last 168 hours. Cardiac Enzymes: No results for input(s): "CKTOTAL", "CKMB", "CKMBINDEX", "TROPONINI" in the last 168 hours. BNP (last 3 results) No results for input(s): "PROBNP" in the last 8760 hours. HbA1C: No results for input(s): "HGBA1C" in the last 72 hours. CBG: No results for input(s): "GLUCAP" in the last 168 hours. Lipid Profile: No results for input(s): "CHOL", "HDL", "LDLCALC", "TRIG", "CHOLHDL", "LDLDIRECT" in the last 72 hours. Thyroid Function Tests: No results for input(s): "TSH", "T4TOTAL", "FREET4", "T3FREE", "THYROIDAB" in the last 72 hours. Anemia Panel: No results for input(s): "VITAMINB12", "FOLATE", "FERRITIN", "TIBC", "IRON", "RETICCTPCT" in the last 72 hours. Sepsis Labs: No results for input(s): "PROCALCITON", "LATICACIDVEN" in the last 168 hours.  No results found for this or any previous visit (from the past 240 hours).       Radiology Studies: No results found.      Scheduled Meds:  apixaban  5 mg Oral BID   atorvastatin  20 mg Oral Daily   cyanocobalamin  1,000 mcg Oral Daily   folic acid  1 mg Oral Daily   gabapentin  600 mg Oral QHS   hydrocortisone  5 mg Oral Daily   melatonin  5 mg Oral QHS   mometasone-formoterol  2 puff Inhalation BID   pantoprazole  40 mg Oral BID   QUEtiapine  25 mg Oral QHS   senna-docusate  2 tablet Oral BID   Continuous Infusions:   LOS: 36 days     Tresa Moore, MD [facility]   If  7PM-7AM, please contact night-coverage  12/27/2023, 11:31 AM

## 2023-12-27 NOTE — Progress Notes (Addendum)
 0800 patient alert to self on room air ambulating in room and hall patient turns off her own alarms education on falls unsuccessful. 1200 patient ambulating in halls needs to be watched for elopement. Patient holding items while using walker in hall staff educated not to give patient items to hold while using walker but education unsuccessful to staff and patient.

## 2023-12-27 NOTE — Plan of Care (Signed)
  Problem: Education: Goal: Knowledge of General Education information will improve Description: Including pain rating scale, medication(s)/side effects and non-pharmacologic comfort measures Outcome: Not Progressing   Problem: Health Behavior/Discharge Planning: Goal: Ability to manage health-related needs will improve Outcome: Not Progressing   Problem: Clinical Measurements: Goal: Ability to maintain clinical measurements within normal limits will improve Outcome: Not Progressing Goal: Will remain free from infection Outcome: Not Progressing Goal: Diagnostic test results will improve Outcome: Not Progressing Goal: Respiratory complications will improve Outcome: Not Progressing Goal: Cardiovascular complication will be avoided Outcome: Not Progressing   Problem: Activity: Goal: Risk for activity intolerance will decrease Outcome: Progressing   Problem: Nutrition: Goal: Adequate nutrition will be maintained Outcome: Progressing   Problem: Coping: Goal: Level of anxiety will decrease Outcome: Not Progressing   Problem: Elimination: Goal: Will not experience complications related to bowel motility Outcome: Not Progressing Goal: Will not experience complications related to urinary retention Outcome: Not Progressing   Problem: Pain Managment: Goal: General experience of comfort will improve and/or be controlled Outcome: Progressing   Problem: Safety: Goal: Ability to remain free from injury will improve Outcome: Not Progressing   Problem: Skin Integrity: Goal: Risk for impaired skin integrity will decrease Outcome: Progressing   Problem: Education: Goal: Knowledge of disease or condition will improve Outcome: Not Progressing Goal: Knowledge of the prescribed therapeutic regimen will improve Outcome: Not Progressing Goal: Individualized Educational Video(s) Outcome: Not Progressing   Problem: Activity: Goal: Ability to tolerate increased activity will  improve Outcome: Progressing Goal: Will verbalize the importance of balancing activity with adequate rest periods Outcome: Progressing   Problem: Respiratory: Goal: Ability to maintain a clear airway will improve Outcome: Progressing Goal: Levels of oxygenation will improve Outcome: Progressing Goal: Ability to maintain adequate ventilation will improve Outcome: Progressing   Problem: Activity: Goal: Ability to tolerate increased activity will improve Outcome: Progressing   Problem: Clinical Measurements: Goal: Ability to maintain a body temperature in the normal range will improve Outcome: Progressing   Problem: Respiratory: Goal: Ability to maintain adequate ventilation will improve Outcome: Progressing Goal: Ability to maintain a clear airway will improve Outcome: Progressing

## 2023-12-27 NOTE — Progress Notes (Signed)
 Mobility Specialist - Progress Note   12/27/23 1004  Mobility  Activity Ambulated independently in hallway  Level of Assistance Independent  Assistive Device Front wheel walker  Distance Ambulated (ft) 320 ft  Activity Response Tolerated well  Mobility Referral Yes  Mobility visit 1 Mobility  Mobility Specialist Start Time (ACUTE ONLY) [id number]  Mobility Specialist Stop Time (ACUTE ONLY) 1001  Mobility Specialist Time Calculation (min) (ACUTE ONLY) 19 min   Pt sitting in recliner on RA upon arrival. Pt STS and ambulates 2 laps in hallway indep. Pt returns to recliner with needs in reach and chair alarm activated.   Terrilyn Saver  Mobility Specialist  12/27/23 10:05 AM

## 2023-12-28 DIAGNOSIS — J189 Pneumonia, unspecified organism: Secondary | ICD-10-CM | POA: Diagnosis not present

## 2023-12-28 NOTE — Progress Notes (Signed)
 Mobility Specialist - Progress Note   12/28/23 1036  Mobility  Activity Ambulated independently in hallway  Level of Assistance Independent  Assistive Device Front wheel walker  Distance Ambulated (ft) 320 ft  Activity Response Tolerated well  Mobility Referral Yes  Mobility visit 1 Mobility  Mobility Specialist Start Time (ACUTE ONLY) 1003  Mobility Specialist Stop Time (ACUTE ONLY) 1016  Mobility Specialist Time Calculation (min) (ACUTE ONLY) 13 min   Pt sitting in recliner on RA upon arrival. Pt STS and ambulates 2 laps in hallway indep. Pt returns to recliner with needs in reach and chair alarm activated.   Terrilyn Saver  Mobility Specialist  12/28/23 10:37 AM

## 2023-12-28 NOTE — Plan of Care (Signed)
 Pt alert and oriented x2; meds given, no significant changes through the might. Problem: Education: Goal: Knowledge of General Education information will improve Description: Including pain rating scale, medication(s)/side effects and non-pharmacologic comfort measures Outcome: Progressing   Problem: Health Behavior/Discharge Planning: Goal: Ability to manage health-related needs will improve Outcome: Progressing   Problem: Clinical Measurements: Goal: Ability to maintain clinical measurements within normal limits will improve Outcome: Progressing Goal: Will remain free from infection Outcome: Progressing Goal: Diagnostic test results will improve Outcome: Progressing Goal: Respiratory complications will improve Outcome: Progressing   Problem: Activity: Goal: Risk for activity intolerance will decrease Outcome: Progressing   Problem: Nutrition: Goal: Adequate nutrition will be maintained Outcome: Progressing   Problem: Coping: Goal: Level of anxiety will decrease Outcome: Progressing   Problem: Elimination: Goal: Will not experience complications related to bowel motility Outcome: Progressing   Problem: Pain Managment: Goal: General experience of comfort will improve and/or be controlled Outcome: Progressing   Problem: Safety: Goal: Ability to remain free from injury will improve Outcome: Progressing   Problem: Skin Integrity: Goal: Risk for impaired skin integrity will decrease Outcome: Progressing   Problem: Activity: Goal: Ability to tolerate increased activity will improve Outcome: Progressing   Problem: Respiratory: Goal: Ability to maintain a clear airway will improve Outcome: Progressing   Problem: Activity: Goal: Ability to tolerate increased activity will improve Outcome: Progressing

## 2023-12-28 NOTE — Progress Notes (Signed)
 PROGRESS NOTE    Tracy Dickerson  ZOX:[id number] DOB: 05/29/1941 DOA: 09/04/2023 PCP: Dwana Melena, PA    Brief Narrative:   Tracy Dickerson is a 83 y.o. female with medical history significant of hypertension, hyperlipidemia, diabetes mellitus, GERD, breast cancer, CKD-3a, septic shock due to UTI, PAF on Eliquis, orthostatic hypotension on Cortef, presented with SOB   Patient has been in the ED for extended period of time since 09/04/23 due to agitation. Patient was evaluated by psychiatry, waiting for placement.  Per ED physician, morning of 11/21/23, patient appeared to be, weaker, developed cough, shortness of breath mild chest pain, found to have oxygen desaturation to 87% on room air, which improved to 95% on 2 L oxygen.  This is new oxygen requirement.  Pt therefore was admitted to the hospitalist service.   She is treated for atypical pneumonia.  Condition has improved.  Currently pending nursing home placement.   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  No leukocytosis or fever.  Clinically not septic. --started on IV Rocephin and azithromycin and Solu-Medrol 80 daily.  Steroid d/c'ed. --on room air by 3/1 Antibiotics completed.  Patient improved.  Off oxygen. 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   Orthostatic hypotension --cont Cortef   Lung nodule:  This is incidental findings by CT. -Need to follow-up with PCP as outpatient   Dementia with behavioral disturbance (HCC) Insomnia. -Fall precaution -Haldol prn for agitation  -Continue Seroquel at night  Long length of stay Pending DSS and guardianship     DVT prophylaxis: Eliquis Code Status: Full Family Communication: None Disposition Plan: Status is: Inpatient Remains inpatient appropriate because: Unsafe discharge plan   Level of care: Telemetry Medical  Consultants:  None  Procedures:  None  Antimicrobials: None   Subjective: Seen and examined.  Sitting up in chair.  No visible distress.  No complaints of pain.    Objective: Vitals:   12/27/23 2054 12/28/23 0419 12/28/23 0847 12/28/23 0847  BP: 135/60 124/84 115/74 115/74  Pulse: 82 63 66 66  Resp: 18 18 18 18   Temp: 98.6 F (37 C) 98.2 F (36.8 C) 97.7 F (36.5 C) 97.7 F (36.5 C)  TempSrc: Oral Oral Oral   SpO2: 97% 94% 95% 95%  Weight:      Height:        Intake/Output Summary (Last 24 hours) at 12/28/2023 1210 Last data filed at 12/27/2023 1548 Gross per 24 hour  Intake 0 ml  Output --  Net 0 ml   Filed Weights   09/04/23 1035 11/28/23 2010  Weight: 90.7 kg 90.7 kg    Examination:  General exam: No acute distress Respiratory system: Clear to auscultation. Respiratory effort normal. Cardiovascular system: S1-S2 RRR, no murmurs,  no pedal edema Gastrointestinal system: Soft, NT/ND, normal bowel sounds. Central nervous system: Alert and oriented x 2. No focal neurological deficits. Extremities: Symmetric 5 x 5 power. Skin: No rashes, lesions or ulcers Psychiatry: Judgement and insight appear normal. Mood & affect flattened.     Data Reviewed: I have personally reviewed following labs and imaging studies  CBC: Recent Labs  Lab  12/27/23 0437  WBC 6.6  HGB 10.1*  HCT 30.5*  MCV 92.1  PLT 152   Basic Metabolic Panel: Recent Labs  Lab 12/27/23 0437  CREATININE 0.97   GFR: Estimated Creatinine Clearance: 52.5 mL/Tracy (by C-G formula based on SCr of 0.97 mg/dL). Liver Function Tests: No results for input(s): "AST", "ALT", "ALKPHOS", "BILITOT", "PROT", "ALBUMIN" in the last 168 hours. No results for input(s): "LIPASE", "AMYLASE" in the last 168 hours. No results for input(s): "AMMONIA" in the last 168 hours. Coagulation Profile: No results for input(s): "INR", "PROTIME" in the last 168 hours. Cardiac Enzymes: No results for input(s): "CKTOTAL", "CKMB", "CKMBINDEX", "TROPONINI" in the last 168 hours. BNP (last 3 results) No results for input(s): "PROBNP" in the last 8760 hours. HbA1C: No results for input(s): "HGBA1C" in the last 72 hours. CBG: No results for input(s): "GLUCAP" in the last 168 hours. Lipid Profile: No results for input(s): "CHOL", "HDL", "LDLCALC", "TRIG", "CHOLHDL", "LDLDIRECT" in the last 72 hours. Thyroid Function Tests: No results for input(s): "TSH", "T4TOTAL", "FREET4", "T3FREE", "THYROIDAB" in the last 72 hours. Anemia Panel: No results for input(s): "VITAMINB12", "FOLATE", "FERRITIN", "TIBC", "IRON", "RETICCTPCT" in the last 72 hours. Sepsis Labs: No results for input(s): "PROCALCITON", "LATICACIDVEN" in the last 168 hours.  No results found for this or any previous visit (from the past 240 hours).       Radiology Studies: No results found.      Scheduled Meds:  apixaban  5 mg Oral BID   atorvastatin  20 mg Oral Daily   cyanocobalamin  1,000 mcg Oral Daily   folic acid  1 mg Oral Daily   gabapentin  600 mg Oral QHS   hydrocortisone  5 mg Oral Daily   melatonin  5 mg Oral QHS   mometasone-formoterol  2 puff Inhalation BID   pantoprazole  40 mg Oral BID   QUEtiapine  25 mg Oral QHS   senna-docusate  2 tablet Oral BID   Continuous Infusions:   LOS: 37 days      Tresa Moore, MD [facility]   If 7PM-7AM, please contact night-coverage  12/28/2023, 12:10 PM

## 2023-12-29 DIAGNOSIS — J189 Pneumonia, unspecified organism: Secondary | ICD-10-CM | POA: Diagnosis not present

## 2023-12-29 NOTE — Progress Notes (Signed)
 PROGRESS NOTE    Tracy Dickerson  MVH:[id number] DOB: 1941-05-01 DOA: 09/04/2023 PCP: Dwana Melena, PA    Brief Narrative:   Tracy Dickerson is a 83 y.o. female with medical history significant of hypertension, hyperlipidemia, diabetes mellitus, GERD, breast cancer, CKD-3a, septic shock due to UTI, PAF on Eliquis, orthostatic hypotension on Cortef, presented with SOB   Patient has been in the ED for extended period of time since 09/04/23 due to agitation. Patient was evaluated by psychiatry, waiting for placement.  Per ED physician, morning of 11/21/23, patient appeared to be, weaker, developed cough, shortness of breath mild chest pain, found to have oxygen desaturation to 87% on room air, which improved to 95% on 2 L oxygen.  This is new oxygen requirement.  Pt therefore was admitted to the hospitalist service.   She is treated for atypical pneumonia.  Condition has improved.  Currently pending nursing home placement.   Assessment & Plan:   Principal Problem:   Atypical pneumonia Active Problems:   ILD (interstitial lung disease) (HCC)   COPD (chronic obstructive pulmonary disease) (HCC)   HTN (hypertension)   HLD (hyperlipidemia)   PAF (paroxysmal atrial fibrillation) (HCC)   CKD stage 3a, GFR 45-59 ml/min (HCC)   Orthostatic hypotension   Lung nodule   Dementia with behavioral disturbance (HCC)   Acute hypoxemic respiratory failure 2/2 Atypical pneumonia, ILD (interstitial lung disease) and COPD (chronic obstructive pulmonary disease) (HCC):  Patient had 2 L new oxygen requirement.  No leukocytosis or fever.  Clinically not septic. --started on IV Rocephin and azithromycin and Solu-Medrol 80 daily.  Steroid d/c'ed. --on room air by 3/1 Antibiotics completed.  Patient improved.  Off oxygen. 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   Orthostatic hypotension --cont Cortef   Lung nodule:  This is incidental findings by CT. -Need to follow-up with PCP as outpatient   Dementia with behavioral disturbance (HCC) Insomnia. -Fall precaution -Haldol prn for agitation  -Continue Seroquel at night  Long length of stay Pending DSS and guardianship     DVT prophylaxis: Eliquis Code Status: Full Family Communication: None Disposition Plan: Status is: Inpatient Remains inpatient appropriate because: Unsafe discharge plan   Level of care: Telemetry Medical  Consultants:  None  Procedures:  None  Antimicrobials: None   Subjective: Seen and examined.  Sitting up in chair eating breakfast.  No distress.  No complaints.  Objective: Vitals:   12/28/23 1741 12/28/23 2128 12/29/23 0446 12/29/23 0942  BP: (!) 127/110 (!) 134/59 (!) 114/55 117/61  Pulse: 68 78 62 75  Resp: 18 20 20 18   Temp: 97.7 F (36.5 C) 98 F (36.7 C) 97.9 F (36.6 C) 97.7 F (36.5 C)  TempSrc:  Oral Oral Oral  SpO2: 96% 94% 94% 92%  Weight:      Height:       No intake or output data in the 24 hours ending 12/29/23 1056  Filed Weights   09/04/23 1035 11/28/23 2010  Weight: 90.7 kg 90.7 kg    Examination:  General exam: No acute distress Respiratory system: Clear to auscultation. Respiratory effort normal. Cardiovascular system: S1-S2 RRR, no murmurs, no pedal edema Gastrointestinal system: Soft, NT/ND, normal bowel sounds. Central nervous system: Alert and oriented x 2. No focal  neurological deficits. Extremities: Symmetric 5 x 5 power. Skin: No rashes, lesions or ulcers Psychiatry: Judgement and insight appear normal. Mood & affect flattened.     Data Reviewed: I have personally reviewed following labs and imaging studies  CBC: Recent Labs  Lab 12/27/23 0437  WBC 6.6  HGB 10.1*  HCT 30.5*  MCV 92.1  PLT 152   Basic Metabolic  Panel: Recent Labs  Lab 12/27/23 0437  CREATININE 0.97   GFR: Estimated Creatinine Clearance: 52.5 mL/min (by C-G formula based on SCr of 0.97 mg/dL). Liver Function Tests: No results for input(s): "AST", "ALT", "ALKPHOS", "BILITOT", "PROT", "ALBUMIN" in the last 168 hours. No results for input(s): "LIPASE", "AMYLASE" in the last 168 hours. No results for input(s): "AMMONIA" in the last 168 hours. Coagulation Profile: No results for input(s): "INR", "PROTIME" in the last 168 hours. Cardiac Enzymes: No results for input(s): "CKTOTAL", "CKMB", "CKMBINDEX", "TROPONINI" in the last 168 hours. BNP (last 3 results) No results for input(s): "PROBNP" in the last 8760 hours. HbA1C: No results for input(s): "HGBA1C" in the last 72 hours. CBG: No results for input(s): "GLUCAP" in the last 168 hours. Lipid Profile: No results for input(s): "CHOL", "HDL", "LDLCALC", "TRIG", "CHOLHDL", "LDLDIRECT" in the last 72 hours. Thyroid Function Tests: No results for input(s): "TSH", "T4TOTAL", "FREET4", "T3FREE", "THYROIDAB" in the last 72 hours. Anemia Panel: No results for input(s): "VITAMINB12", "FOLATE", "FERRITIN", "TIBC", "IRON", "RETICCTPCT" in the last 72 hours. Sepsis Labs: No results for input(s): "PROCALCITON", "LATICACIDVEN" in the last 168 hours.  No results found for this or any previous visit (from the past 240 hours).       Radiology Studies: No results found.      Scheduled Meds:  apixaban  5 mg Oral BID   atorvastatin  20 mg Oral Daily   cyanocobalamin  1,000 mcg Oral Daily   folic acid  1 mg Oral Daily   gabapentin  600 mg Oral QHS   hydrocortisone  5 mg Oral Daily   melatonin  5 mg Oral QHS   mometasone-formoterol  2 puff Inhalation BID   pantoprazole  40 mg Oral BID   QUEtiapine  25 mg Oral QHS   senna-docusate  2 tablet Oral BID   Continuous Infusions:   LOS: 38 days     Tresa Moore, MD [facility]   If 7PM-7AM, please contact  night-coverage  12/29/2023, 10:56 AM

## 2023-12-29 NOTE — Progress Notes (Signed)
 Mobility Specialist - Progress Note   12/29/23 1037  Mobility  Activity Ambulated independently in hallway  Level of Assistance Modified independent, requires aide device or extra time  Assistive Device Front wheel walker  Distance Ambulated (ft) 1000 ft  Activity Response Tolerated well  Mobility visit 1 Mobility  Mobility Specialist Start Time (ACUTE ONLY) [id number]  Mobility Specialist Stop Time (ACUTE ONLY) 0935  Mobility Specialist Time Calculation (min) (ACUTE ONLY) 17 min   Pt sitting in the recliner upon entry, utilizing RA. Pt motivated and agreeable to OOB amb this date. Pt STS to RW and amb to the SDS unit and returned to [facility]. Pt left seated in the recliner, with alarm set and needs within reach.  Zetta Bills Mobility Specialist 12/29/23 10:39 AM

## 2023-12-29 NOTE — Plan of Care (Signed)

## 2023-12-29 NOTE — TOC Progression Note (Signed)
 Transition of Care [facility]) - Discharge Note   Patient Details  Name: Tracy Dickerson MRN: [id number] Date of Birth: 1940/11/27  [facility]) CM/SW Contact:  Chapman Fitch, RN Phone Number: 12/29/2023, 2:57 PM   Clinical Narrative:      [name] at [facility] has notified patient's guardian of signatures needed in order for patient to admit  Per [facility] no needs from [facility] at this time  Guardian notified via email that they would need to provide clothes for patient at rehab Final next level of care: Long Term Nursing Home Barriers to Discharge: Continued Medical Work up   Patient Goals and CMS Choice Patient states their goals for this hospitalization and ongoing recovery are:: patient has dementia and is confused CMS Medicare.gov Compare Post Acute Care list provided to:: Legal Guardian Choice offered to / list presented to : NA      Discharge Placement                       Discharge Plan and Services Additional resources added to the After Visit Summary for   In-house Referral: Clinical Social Work Discharge Planning Services: NA Post Acute Care Choice: Skilled Nursing Facility            DME Agency: NA     Representative spoke with at DME Agency: N/A HH Arranged: NA          Social Drivers of Health (SDOH) Interventions SDOH Screenings   Food Insecurity: No Food Insecurity (11/21/2023)  Housing: Low Risk  (11/21/2023)  Transportation Needs: No Transportation Needs (11/21/2023)  Utilities: Not At Risk (11/21/2023)  Financial Resource Strain: Low Risk  (02/04/2023)   Received from [facility], [facility]  Physical Activity: Inactive (07/16/2021)   Received from [facility], [facility]  Social Connections: Patient Unable To Answer (11/21/2023)  Stress: No Stress Concern Present (07/16/2021)   Received from [facility], [facility]  Tobacco Use: Medium Risk (09/04/2023)  Health Literacy: Medium Risk (07/16/2021)    Received from [facility], [facility]     Readmission Risk Interventions     No data to display

## 2023-12-30 DIAGNOSIS — J189 Pneumonia, unspecified organism: Secondary | ICD-10-CM | POA: Diagnosis not present

## 2023-12-30 MED ORDER — SENNOSIDES-DOCUSATE SODIUM 8.6-50 MG PO TABS: 2.0000 | ORAL_TABLET | Freq: Two times a day (BID) | ORAL | Status: DC

## 2023-12-30 MED ORDER — QUETIAPINE FUMARATE 25 MG PO TABS
25.0000 mg | ORAL_TABLET | Freq: Every day | ORAL | Status: DC
Start: 2023-12-30 — End: 2024-06-01

## 2023-12-30 NOTE — Plan of Care (Signed)
  Problem: Education: Goal: Knowledge of General Education information will improve Description: Including pain rating scale, medication(s)/side effects and non-pharmacologic comfort measures Outcome: Progressing   Problem: Health Behavior/Discharge Planning: Goal: Ability to manage health-related needs will improve Outcome: Progressing   Problem: Clinical Measurements: Goal: Ability to maintain clinical measurements within normal limits will improve Outcome: Progressing Goal: Will remain free from infection Outcome: Progressing Goal: Diagnostic test results will improve Outcome: Progressing Goal: Respiratory complications will improve Outcome: Progressing Goal: Cardiovascular complication will be avoided Outcome: Progressing   Problem: Activity: Goal: Risk for activity intolerance will decrease Outcome: Progressing   Problem: Nutrition: Goal: Adequate nutrition will be maintained Outcome: Progressing   Problem: Coping: Goal: Level of anxiety will decrease Outcome: Progressing   Problem: Elimination: Goal: Will not experience complications related to bowel motility Outcome: Progressing Goal: Will not experience complications related to urinary retention Outcome: Progressing   Problem: Safety: Goal: Ability to remain free from injury will improve Outcome: Progressing   Problem: Skin Integrity: Goal: Risk for impaired skin integrity will decrease Outcome: Progressing   Problem: Education: Goal: Knowledge of disease or condition will improve Outcome: Progressing Goal: Knowledge of the prescribed therapeutic regimen will improve Outcome: Progressing Goal: Individualized Educational Video(s) Outcome: Progressing   Problem: Activity: Goal: Ability to tolerate increased activity will improve Outcome: Progressing Goal: Will verbalize the importance of balancing activity with adequate rest periods Outcome: Progressing

## 2023-12-30 NOTE — Progress Notes (Signed)
 Mobility Specialist - Progress Note   12/30/23 0916  Mobility  Activity Ambulated independently in hallway  Level of Assistance Modified independent, requires aide device or extra time  Assistive Device Front wheel walker  Distance Ambulated (ft) 320 ft  Activity Response Tolerated well  Mobility visit 1 Mobility  Mobility Specialist Start Time (ACUTE ONLY) 0906  Mobility Specialist Stop Time (ACUTE ONLY) 0915  Mobility Specialist Time Calculation (min) (ACUTE ONLY) 9 min   Zetta Bills Mobility Specialist 12/30/23 9:17 AM

## 2023-12-30 NOTE — TOC Progression Note (Signed)
 Transition of Care [facility]) - Progression Note    Patient Details  Name: Tracy Dickerson MRN: [id number] Date of Birth: March 22, 1941  [facility]) CM/SW Contact  Chapman Fitch, RN Phone Number: 12/30/2023, 2:23 PM  Clinical Narrative:     Alphonzo Cruise summary, TB results secure emailed to Crystal at [facility]  Expected Discharge Plan: Long Term Nursing Home Barriers to Discharge: Continued Medical Work up  Expected Discharge Plan and Services In-house Referral: Clinical Social Work Discharge Planning Services: NA Post Acute Care Choice: Skilled Nursing Facility Living arrangements for the past 2 months: Single Family Home (Unsafe to return)                   DME Agency: NA     Representative spoke with at DME Agency: N/A HH Arranged: NA           Social Determinants of Health (SDOH) Interventions SDOH Screenings   Food Insecurity: No Food Insecurity (11/21/2023)  Housing: Low Risk  (11/21/2023)  Transportation Needs: No Transportation Needs (11/21/2023)  Utilities: Not At Risk (11/21/2023)  Financial Resource Strain: Low Risk  (02/04/2023)   [facility], [facility]  Physical Activity: Inactive (07/16/2021)   Received from [facility], [facility]  Social Connections: Patient Unable To Answer (11/21/2023)  Stress: No Stress Concern Present (07/16/2021)   Received from [facility], [facility]  Tobacco Use: Medium Risk (09/04/2023)  Health Literacy: Medium Risk (07/16/2021)   Received from [facility], [facility]    Readmission Risk Interventions     No data to display

## 2023-12-30 NOTE — Discharge Summary (Addendum)
 Physician Discharge Summary  Tracy Dickerson:[id number] DOB: 03-09-1941 DOA: 09/04/2023  PCP: Dwana Melena, PA  Admit date: 09/04/2023 Discharge date: 12/30/2023  Admitted From: Home Disposition:  Long term care  Recommendations for Outpatient Follow-up:  Follow up with PCP in 1-2 weeks   Home Health:No Equipment/Devices:None   Discharge Condition:Stable  CODE STATUS:FULL  Diet recommendation: Reg  Brief/Interim Summary:  Tracy Dickerson is a 83 y.o. female with medical history significant of hypertension, hyperlipidemia, diabetes mellitus, GERD, breast cancer, CKD-3a, septic shock due to UTI, PAF on Eliquis, orthostatic hypotension on Cortef, presented with SOB   Patient has been in the ED for extended period of time since 09/04/23 due to agitation. Patient was evaluated by psychiatry, waiting for placement.  Per ED physician, morning of 11/21/23, patient appeared to be, weaker, developed cough, shortness of breath mild chest pain, found to have oxygen desaturation to 87% on room air, which improved to 95% on 2 L oxygen.  This is new oxygen requirement.  Pt therefore was admitted to the hospitalist service.   She is treated for atypical pneumonia.  Condition has improved.  Currently pending nursing home placement.  DSS guardianship workup in progress    Discharge Diagnoses:  Principal Problem:   Atypical pneumonia Active Problems:   ILD (interstitial lung disease) (HCC)   COPD (chronic obstructive pulmonary disease) (HCC)   HTN (hypertension)   HLD (hyperlipidemia)   PAF (paroxysmal atrial fibrillation) (HCC)   CKD stage 3a, GFR 45-59 ml/min (HCC)   Orthostatic hypotension   Lung nodule   Dementia with behavioral disturbance (HCC) Acute hypoxemic respiratory failure 2/2 Atypical pneumonia, ILD (interstitial lung disease) and COPD (chronic obstructive pulmonary disease) (HCC):  Patient had 2 L new oxygen requirement.  No leukocytosis or fever.  Clinically not  septic. --started on IV Rocephin and azithromycin and Solu-Medrol 80 daily.  Steroid d/c'ed. --on room air by 3/1 Antibiotics completed.  Patient improved.  Off oxygen. Remains stable on room air No indication for antibiotics on discharge    HTN (hypertension):  BP controlled   HLD (hyperlipidemia) --cont statin   PAF (paroxysmal atrial fibrillation) (HCC):  --cont Eliquis   CKD stage 3a, GFR 45-59 ml/min [facility]): Renal function stable.   Orthostatic hypotension --cont Cortef   Lung nodule:  This is incidental findings by CT. -Need to follow-up with PCP as outpatient   Dementia with behavioral disturbance (HCC) Insomnia.  -Continue Seroquel at night   Long length of stay Discharge pending DSS guardianship     Discharge Instructions  Discharge Instructions     Diet - low sodium heart healthy   Complete by: As directed    Increase activity slowly   Complete by: As directed       Allergies as of 12/30/2023       Reactions   Oxycodone    Percocet [oxycodone-acetaminophen] Itching   Penicillins Rash   Has patient had a PCN reaction causing immediate rash, facial/tongue/throat swelling, SOB or lightheadedness with hypotension: Yes Has patient had a PCN reaction causing severe rash involving mucus membranes or skin necrosis: Unknown Has patient had a PCN reaction that required hospitalization: Unknown Has patient had a PCN reaction occurring within the last 10 years: No If all of the above answers are "NO", then may proceed with Cephalosporin use.        Medication List     STOP taking these medications    midodrine 5 MG tablet Commonly known as: PROAMATINE   triamcinolone  ointment 0.1 % Commonly known as: KENALOG   Vitamin D (Ergocalciferol) 1.25 MG (50000 UNIT) Caps capsule Commonly known as: DRISDOL       TAKE these medications    acetaminophen 500 MG tablet Commonly known as: TYLENOL Take 500 mg by mouth every 6 (six) hours as needed.    apixaban 5 MG Tabs tablet Commonly known as: ELIQUIS Take 1 tablet (5 mg total) by mouth 2 (two) times daily.   atorvastatin 20 MG tablet Commonly known as: LIPITOR Take 20 mg by mouth daily.   bisacodyl 10 MG suppository Commonly known as: DULCOLAX Place 1 suppository (10 mg total) rectally daily as needed for severe constipation.   cetirizine 5 MG tablet Commonly known as: ZYRTEC Take 1 tablet (5 mg total) by mouth daily.   cyanocobalamin 1000 MCG tablet Commonly known as: VITAMIN B12 Take 1 tablet (1,000 mcg total) by mouth daily.   fluticasone-salmeterol 250-50 MCG/ACT Aepb Commonly known as: ADVAIR Inhale 1 puff into the lungs in the morning and at bedtime.   folic acid 1 MG tablet Commonly known as: FOLVITE Take 1 tablet (1 mg total) by mouth daily.   gabapentin 300 MG capsule Commonly known as: NEURONTIN Take 2 capsules (600 mg total) by mouth at bedtime.   hydrocortisone 5 MG tablet Commonly known as: CORTEF Take 1 tablet (5 mg total) by mouth daily.   hydrOXYzine 10 MG tablet Commonly known as: ATARAX Take 1 tablet (10 mg total) by mouth 3 (three) times daily as needed for itching.   meclizine 12.5 MG tablet Commonly known as: ANTIVERT Take 1 tablet (12.5 mg total) by mouth 3 (three) times daily as needed for dizziness.   ondansetron 4 MG disintegrating tablet Commonly known as: Zofran ODT Take 1 tablet (4 mg total) by mouth every 8 (eight) hours as needed.   pantoprazole 40 MG tablet Commonly known as: Protonix Take 1 tablet (40 mg total) by mouth 2 (two) times daily.   ProAir HFA 108 (90 Base) MCG/ACT inhaler Generic drug: albuterol Inhale 2 puffs into the lungs every 4 (four) hours as needed for wheezing or shortness of breath.   QUEtiapine 25 MG tablet Commonly known as: SEROQUEL Take 1 tablet (25 mg total) by mouth at bedtime.   senna-docusate 8.6-50 MG tablet Commonly known as: Senokot-S Take 2 tablets by mouth 2 (two) times daily.    Spiriva HandiHaler 18 MCG inhalation capsule Generic drug: tiotropium Place 1 capsule (18 mcg total) into inhaler and inhale daily.   traMADol 50 MG tablet Commonly known as: ULTRAM Take 1 tablet (50 mg total) by mouth 2 (two) times daily as needed.        Follow-up Information     Dwana Melena, [place]. Schedule an appointment as soon as possible for a visit in 1 week(s).   Specialty: Internal Medicine Contact information: [address] [place] [address] [phone]                Allergies  Allergen Reactions   Oxycodone    Percocet [Oxycodone-Acetaminophen] Itching   Penicillins Rash    Has patient had a PCN reaction causing immediate rash, facial/tongue/throat swelling, SOB or lightheadedness with hypotension: Yes Has patient had a PCN reaction causing severe rash involving mucus membranes or skin necrosis: Unknown Has patient had a PCN reaction that required hospitalization: Unknown Has patient had a PCN reaction occurring within the last 10 years: No If all of the above answers are "NO", then may  proceed with Cephalosporin use.    Consultations: Psychiatry   Procedures/Studies: No results found.    Subjective: Seen and examined on the day of discharge.  Pleasant, stable, no distress  Discharge Exam: Vitals:   12/30/23 0439 12/30/23 0832  BP: 120/66 128/67  Pulse: 60 63  Resp: 20 18  Temp: (!) 97.1 F (36.2 C) 97.8 F (36.6 C)  SpO2: 94% 94%   Vitals:   12/29/23 0942 12/29/23 2053 12/30/23 0439 12/30/23 0832  BP: 117/61 (!) 140/61 120/66 128/67  Pulse: 75 82 60 63  Resp: 18 20 20 18   Temp: 97.7 F (36.5 C) 97.9 F (36.6 C) (!) 97.1 F (36.2 C) 97.8 F (36.6 C)  TempSrc: Oral Oral  Oral  SpO2: 92% 95% 94% 94%  Weight:      Height:        General: Pt is alert, awake, not in acute distress Cardiovascular: RRR, S1/S2 +, no rubs, no gallops Respiratory: CTA bilaterally, no wheezing, no rhonchi Abdominal: Soft, NT,  ND, bowel sounds + Extremities: no edema, no cyanosis    The results of significant diagnostics from this hospitalization (including imaging, microbiology, ancillary and laboratory) are listed below for reference.     Microbiology: No results found for this or any previous visit (from the past 240 hours).   Labs: BNP (last 3 results) Recent Labs    08/20/23 1934  BNP 233.9*   Basic Metabolic Panel: Recent Labs  Lab 12/27/23 0437  CREATININE 0.97   Liver Function Tests: No results for input(s): "AST", "ALT", "ALKPHOS", "BILITOT", "PROT", "ALBUMIN" in the last 168 hours. No results for input(s): "LIPASE", "AMYLASE" in the last 168 hours. No results for input(s): "AMMONIA" in the last 168 hours. CBC: Recent Labs  Lab 12/27/23 0437  WBC 6.6  HGB 10.1*  HCT 30.5*  MCV 92.1  PLT 152   Cardiac Enzymes: No results for input(s): "CKTOTAL", "CKMB", "CKMBINDEX", "TROPONINI" in the last 168 hours. BNP: Invalid input(s): "POCBNP" CBG: No results for input(s): "GLUCAP" in the last 168 hours. D-Dimer No results for input(s): "DDIMER" in the last 72 hours. Hgb A1c No results for input(s): "HGBA1C" in the last 72 hours. Lipid Profile No results for input(s): "CHOL", "HDL", "LDLCALC", "TRIG", "CHOLHDL", "LDLDIRECT" in the last 72 hours. Thyroid function studies No results for input(s): "TSH", "T4TOTAL", "T3FREE", "THYROIDAB" in the last 72 hours.  Invalid input(s): "FREET3" Anemia work up No results for input(s): "VITAMINB12", "FOLATE", "FERRITIN", "TIBC", "IRON", "RETICCTPCT" in the last 72 hours. Urinalysis    Component Value Date/Time   COLORURINE YELLOW (A) 11/21/2023 1621   APPEARANCEUR CLOUDY (A) 11/21/2023 1621   APPEARANCEUR Hazy 01/07/2015 1812   LABSPEC 1.012 11/21/2023 1621   LABSPEC 1.014 01/07/2015 1812   PHURINE 8.0 11/21/2023 1621   GLUCOSEU NEGATIVE 11/21/2023 1621   GLUCOSEU Negative 01/07/2015 1812   HGBUR SMALL (A) 11/21/2023 1621   BILIRUBINUR  NEGATIVE 11/21/2023 1621   BILIRUBINUR Negative 01/07/2015 1812   KETONESUR NEGATIVE 11/21/2023 1621   PROTEINUR NEGATIVE 11/21/2023 1621   NITRITE NEGATIVE 11/21/2023 1621   LEUKOCYTESUR NEGATIVE 11/21/2023 1621   LEUKOCYTESUR Negative 01/07/2015 1812   Sepsis Labs Recent Labs  Lab 12/27/23 0437  WBC 6.6   Microbiology No results found for this or any previous visit (from the past 240 hours).   Time coordinating discharge: Over 30 minutes  SIGNED:   Tresa Moore, MD  [facility] 12/30/2023, 2:55 PM Pager   If 7PM-7AM, please contact night-coverage

## 2023-12-30 NOTE — Progress Notes (Signed)
    Date of Service: 12/30/2023 12:21 PM   Signed          11/28/23 1423  PPD Results  Does patient have an induration at the injection site? No  Induration(mm) 3 mm  Name of Physician Notified Marrion Coy              Nurse Manager spoke with nurse who recorded PPD result.  Induration result of 3mm was inaccurate,  The actual result was 0mm.  As read per Sheliah Hatch, RN (11/28/2023 at 1423)

## 2023-12-30 NOTE — NC FL2 (Signed)
 [place] MEDICAID FL2 LEVEL OF CARE FORM     IDENTIFICATION  Patient Name: Tracy Dickerson Birthdate: [birth year] Sex: female Admission Date (Current Location): 09/04/2023  [place] and [place] Number:  Chiropodist and Address:  [facility], [address], [place], [place] [address]      Provider Number: [id number]  Attending Physician Name and Address:  Tresa Moore, MD  Relative Name and Phone Number:       Current Level of Care: Hospital Recommended Level of Care: Memory Care Prior Approval Number:    Date Approved/Denied:   PASRR Number:    Discharge Plan:  (Memory care)    Current Diagnoses: Patient Active Problem List   Diagnosis Date Noted   Atypical pneumonia 11/21/2023   ILD (interstitial lung disease) (HCC) 11/21/2023   Dementia with behavioral disturbance (HCC) 11/21/2023   Low serum cortisol level 09/02/2023   Headache 08/30/2023   Vitamin D deficiency 08/28/2023   Dizziness 08/25/2023   Iron deficiency anemia 08/23/2023   Iron deficiency anemia due to chronic blood loss 08/21/2023   Syncope 08/20/2023   Normocytic anemia 08/20/2023   Obesity (BMI 30-39.9) 08/20/2023   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/2023   Vitamin B12 deficiency 06/20/2023   Carotid stenosis 06/20/2023   Dementia (HCC) 06/19/2023   Generalized weakness 06/18/2023   Acute on chronic anemia 06/18/2023   H/O: upper GI bleed 06/18/2023   Chronic anticoagulation 06/18/2023   Infestation by bed bug 06/18/2023   CKD stage 3a, GFR 45-59 ml/min (HCC) 01/07/2023   Overweight (BMI 25.0-29.9) 01/06/2023   Type 2 diabetes mellitus with hyperlipidemia (HCC) 01/06/2023   COPD (chronic obstructive pulmonary disease) (HCC) 01/06/2023   Hypokalemia 01/06/2023   UGIB (upper gastrointestinal bleed) 01/05/2023   Unsatisfactory living conditions 01/05/2023   Constipation    UTI (urinary tract infection) 08/13/2020   HLD  (hyperlipidemia) 08/13/2020   Acute renal failure superimposed on stage 3a chronic kidney disease (HCC) 08/13/2020   PAF (paroxysmal atrial fibrillation) (HCC) 08/13/2020   HTN (hypertension) 08/13/2020   Dehydration    Dehydration, moderate    Acute renal failure (HCC) 04/22/2020   Hyperkalemia    Septic shock (HCC)    Sepsis due to urinary tract infection (HCC) 03/13/2018   Acute kidney injury superimposed on chronic kidney disease (HCC) 02/13/2018   Closed extra-articular fracture of distal tibia 02/02/2018   Community acquired pneumonia 10/29/2017   Orthostatic hypotension 01/26/2017   Lung nodule 05/30/2014   Sleep apnea 05/10/2014   Barrett's esophagus 05/13/2007   Malignant neoplasm of breast (female) (HCC) 06/01/1997    Orientation RESPIRATION BLADDER Height & Weight     Self, Situation, Place  Normal Continent Weight: 90.7 kg Height:  5\' 6"  (167.6 cm)  BEHAVIORAL SYMPTOMS/MOOD NEUROLOGICAL BOWEL NUTRITION STATUS   (Irritable at times)  (Dementia) Continent No table salt added  AMBULATORY STATUS COMMUNICATION OF NEEDS Skin   Supervision Verbally Skin abrasions, Bruising                       Personal Care Assistance Level of Assistance  Bathing, Feeding, Dressing Bathing Assistance: Limited assistance Feeding assistance: Limited assistance Dressing Assistance: Limited assistance     Functional Limitations Info  Sight, Hearing, Speech Sight Info: Impaired Hearing Info: Impaired Speech Info: Adequate    SPECIAL CARE FACTORS FREQUENCY  Contractures Contractures Info: Not present    Additional Factors Info  Code Status, Allergies Code Status Info: Full Allergies Info: Oxycodone, Percocet (Oxycodone-acetaminophen), Penicillins           Medication List       STOP taking these medications     midodrine 5 MG tablet Commonly known as: PROAMATINE    triamcinolone ointment 0.1 % Commonly known as: KENALOG    Vitamin  D (Ergocalciferol) 1.25 MG (50000 UNIT) Caps capsule Commonly known as: DRISDOL           TAKE these medications     acetaminophen 500 MG tablet Commonly known as: TYLENOL Take 500 mg by mouth every 6 (six) hours as needed.    apixaban 5 MG Tabs tablet Commonly known as: ELIQUIS Take 1 tablet (5 mg total) by mouth 2 (two) times daily.    atorvastatin 20 MG tablet Commonly known as: LIPITOR Take 20 mg by mouth daily.    bisacodyl 10 MG suppository Commonly known as: DULCOLAX Place 1 suppository (10 mg total) rectally daily as needed for severe constipation.    cetirizine 5 MG tablet Commonly known as: ZYRTEC Take 1 tablet (5 mg total) by mouth daily.    cyanocobalamin 1000 MCG tablet Commonly known as: VITAMIN B12 Take 1 tablet (1,000 mcg total) by mouth daily.    fluticasone-salmeterol 250-50 MCG/ACT Aepb Commonly known as: ADVAIR Inhale 1 puff into the lungs in the morning and at bedtime.    folic acid 1 MG tablet Commonly known as: FOLVITE Take 1 tablet (1 mg total) by mouth daily.    gabapentin 300 MG capsule Commonly known as: NEURONTIN Take 2 capsules (600 mg total) by mouth at bedtime.    hydrocortisone 5 MG tablet Commonly known as: CORTEF Take 1 tablet (5 mg total) by mouth daily.    hydrOXYzine 10 MG tablet Commonly known as: ATARAX Take 1 tablet (10 mg total) by mouth 3 (three) times daily as needed for itching.    meclizine 12.5 MG tablet Commonly known as: ANTIVERT Take 1 tablet (12.5 mg total) by mouth 3 (three) times daily as needed for dizziness.    ondansetron 4 MG disintegrating tablet Commonly known as: Zofran ODT Take 1 tablet (4 mg total) by mouth every 8 (eight) hours as needed.    pantoprazole 40 MG tablet Commonly known as: Protonix Take 1 tablet (40 mg total) by mouth 2 (two) times daily.    ProAir HFA 108 (90 Base) MCG/ACT inhaler Generic drug: albuterol Inhale 2 puffs into the lungs every 4 (four) hours as needed for wheezing  or shortness of breath.    QUEtiapine 25 MG tablet Commonly known as: SEROQUEL Take 1 tablet (25 mg total) by mouth at bedtime.    senna-docusate 8.6-50 MG tablet Commonly known as: Senokot-S Take 2 tablets by mouth 2 (two) times daily.    Spiriva HandiHaler 18 MCG inhalation capsule Generic drug: tiotropium Place 1 capsule (18 mcg total) into inhaler and inhale daily.    traMADol 50 MG tablet Commonly known as: ULTRAM Take 1 tablet (50 mg total) by mouth 2 (two) times daily as needed.  Relevant Imaging Results:  Relevant Lab Results:   Additional Information SS#: [id number]  Chapman Fitch, RN

## 2023-12-30 NOTE — Progress Notes (Signed)
 PROGRESS NOTE    Tracy Dickerson  ACZ:[id number] DOB: 09/02/1941 DOA: 09/04/2023 PCP: Dwana Melena, PA    Brief Narrative:   Tracy Dickerson is a 83 y.o. female with medical history significant of hypertension, hyperlipidemia, diabetes mellitus, GERD, breast cancer, CKD-3a, septic shock due to UTI, PAF on Eliquis, orthostatic hypotension on Cortef, presented with SOB   Patient has been in the ED for extended period of time since 09/04/23 due to agitation. Patient was evaluated by psychiatry, waiting for placement.  Per ED physician, morning of 11/21/23, patient appeared to be, weaker, developed cough, shortness of breath mild chest pain, found to have oxygen desaturation to 87% on room air, which improved to 95% on 2 L oxygen.  This is new oxygen requirement.  Pt therefore was admitted to the hospitalist service.   She is treated for atypical pneumonia.  Condition has improved.  Currently pending nursing home placement.  DSS guardianship workup in progress   Assessment & Plan:   Principal Problem:   Atypical pneumonia Active Problems:   ILD (interstitial lung disease) (HCC)   COPD (chronic obstructive pulmonary disease) (HCC)   HTN (hypertension)   HLD (hyperlipidemia)   PAF (paroxysmal atrial fibrillation) (HCC)   CKD stage 3a, GFR 45-59 ml/min (HCC)   Orthostatic hypotension   Lung nodule   Dementia with behavioral disturbance (HCC)   Acute hypoxemic respiratory failure 2/2 Atypical pneumonia, ILD (interstitial lung disease) and COPD (chronic obstructive pulmonary disease) (HCC):  Patient had 2 L new oxygen requirement.  No leukocytosis or fever.  Clinically not septic. --started on IV Rocephin and azithromycin and Solu-Medrol 80 daily.  Steroid d/c'ed. --on room air by 3/1 Antibiotics completed.  Patient improved.  Off oxygen. Remains stable on room air Vitals per unit protocol  HTN (hypertension):  BP controlled   HLD (hyperlipidemia) --cont statin   PAF (paroxysmal  atrial fibrillation) (HCC):  --cont Eliquis   CKD stage 3a, GFR 45-59 ml/min [facility]): Renal function stable.   Orthostatic hypotension --cont Cortef   Lung nodule:  This is incidental findings by CT. -Need to follow-up with PCP as outpatient   Dementia with behavioral disturbance (HCC) Insomnia. -Fall precaution -Haldol prn for agitation  -Continue Seroquel at night  Long length of stay Pending DSS and guardianship     DVT prophylaxis: Eliquis Code Status: Full Family Communication: None Disposition Plan: Status is: Inpatient Remains inpatient appropriate because: Unsafe discharge plan   Level of care: Telemetry Medical  Consultants:  None  Procedures:  None  Antimicrobials: None   Subjective: Seen and examined.  Sitting up in chair eating breakfast.  No distress.  No complaints  Objective: Vitals:   12/29/23 0942 12/29/23 2053 12/30/23 0439 12/30/23 0832  BP: 117/61 (!) 140/61 120/66 128/67  Pulse: 75 82 60 63  Resp: 18 20 20 18   Temp: 97.7 F (36.5 C) 97.9 F (36.6 C) (!) 97.1 F (36.2 C) 97.8 F (36.6 C)  TempSrc: Oral Oral  Oral  SpO2: 92% 95% 94% 94%  Weight:      Height:        Intake/Output Summary (Last 24 hours) at 12/30/2023 0957 Last data filed at 12/29/2023 2100 Gross per 24 hour  Intake 480 ml  Output --  Net 480 ml    Filed Weights   09/04/23 1035 11/28/23 2010  Weight: 90.7 kg 90.7 kg    Examination:  General exam: NAD Respiratory system: Clear to auscultation. Respiratory effort normal. Cardiovascular system: S1-S2 RRR, no  murmurs, no pedal edema Gastrointestinal system: Soft, NT/ND, normal bowel sounds. Central nervous system: Alert and oriented x 2. No focal neurological deficits. Extremities: Symmetric 5 x 5 power. Skin: No rashes, lesions or ulcers Psychiatry: Judgement and insight appear normal. Mood & affect flattened.     Data Reviewed: I have personally reviewed following labs and imaging studies  CBC: Recent  Labs  Lab 12/27/23 0437  WBC 6.6  HGB 10.1*  HCT 30.5*  MCV 92.1  PLT 152   Basic Metabolic Panel: Recent Labs  Lab 12/27/23 0437  CREATININE 0.97   GFR: Estimated Creatinine Clearance: 52.5 mL/min (by C-G formula based on SCr of 0.97 mg/dL). Liver Function Tests: No results for input(s): "AST", "ALT", "ALKPHOS", "BILITOT", "PROT", "ALBUMIN" in the last 168 hours. No results for input(s): "LIPASE", "AMYLASE" in the last 168 hours. No results for input(s): "AMMONIA" in the last 168 hours. Coagulation Profile: No results for input(s): "INR", "PROTIME" in the last 168 hours. Cardiac Enzymes: No results for input(s): "CKTOTAL", "CKMB", "CKMBINDEX", "TROPONINI" in the last 168 hours. BNP (last 3 results) No results for input(s): "PROBNP" in the last 8760 hours. HbA1C: No results for input(s): "HGBA1C" in the last 72 hours. CBG: No results for input(s): "GLUCAP" in the last 168 hours. Lipid Profile: No results for input(s): "CHOL", "HDL", "LDLCALC", "TRIG", "CHOLHDL", "LDLDIRECT" in the last 72 hours. Thyroid Function Tests: No results for input(s): "TSH", "T4TOTAL", "FREET4", "T3FREE", "THYROIDAB" in the last 72 hours. Anemia Panel: No results for input(s): "VITAMINB12", "FOLATE", "FERRITIN", "TIBC", "IRON", "RETICCTPCT" in the last 72 hours. Sepsis Labs: No results for input(s): "PROCALCITON", "LATICACIDVEN" in the last 168 hours.  No results found for this or any previous visit (from the past 240 hours).       Radiology Studies: No results found.      Scheduled Meds:  apixaban  5 mg Oral BID   atorvastatin  20 mg Oral Daily   cyanocobalamin  1,000 mcg Oral Daily   folic acid  1 mg Oral Daily   gabapentin  600 mg Oral QHS   hydrocortisone  5 mg Oral Daily   melatonin  5 mg Oral QHS   mometasone-formoterol  2 puff Inhalation BID   pantoprazole  40 mg Oral BID   QUEtiapine  25 mg Oral QHS   senna-docusate  2 tablet Oral BID   Continuous Infusions:   LOS:  39 days     Tresa Moore, MD [facility]   If 7PM-7AM, please contact night-coverage  12/30/2023, 9:57 AM

## 2023-12-30 NOTE — Plan of Care (Signed)
  Problem: Education: Goal: Knowledge of General Education information will improve Description: Including pain rating scale, medication(s)/side effects and non-pharmacologic comfort measures  Outcome: Progressing   Problem: Health Behavior/Discharge Planning: Goal: Ability to manage health-related needs will improve   Problem: Coping: Goal: Level of anxiety will decrease   Problem: Nutrition: Goal: Adequate nutrition will be maintained  Outcome: Progressing

## 2023-12-30 NOTE — Progress Notes (Signed)
   11/28/23 1423  PPD Results  Does patient have an induration at the injection site? No  Induration(mm) 3 mm  Name of Physician Notified Marrion Coy

## 2023-12-31 DIAGNOSIS — J189 Pneumonia, unspecified organism: Secondary | ICD-10-CM | POA: Diagnosis not present

## 2023-12-31 NOTE — Progress Notes (Signed)
 Mobility Specialist - Progress Note   12/31/23 0933  Mobility  Activity Ambulated independently in hallway  Level of Assistance Modified independent, requires aide device or extra time  Assistive Device Front wheel walker  Distance Ambulated (ft) 400 ft  Activity Response Tolerated well  Mobility visit 1 Mobility  Mobility Specialist Start Time (ACUTE ONLY) 0911  Mobility Specialist Stop Time (ACUTE ONLY) 0922  Mobility Specialist Time Calculation (min) (ACUTE ONLY) 11 min   Zetta Bills Mobility Specialist 12/31/23 9:34 AM

## 2023-12-31 NOTE — Care Management Important Message (Signed)
 Important Message  Patient Details  Name: Tracy Dickerson MRN: [id number] Date of Birth: [birth year]   Important Message Given:  Yes - Medicare IM     Marcell Anger 12/31/2023, 10:55 AM

## 2023-12-31 NOTE — TOC Transition Note (Signed)
 Transition of Care [facility]) - Discharge Note   Patient Details  Name: Tracy Dickerson MRN: [id number] Date of Birth: 1941-03-26  Transition of Care [facility]) CM/SW Contact:  Tracy Fitch, RN Phone Number: 12/31/2023, 4:02 PM   Clinical Narrative:      Tracy Dickerson with Tracy Dickerson Confirms they have all needed paperwork for admission. Updated dc summary and fl2 secure emailed Bedside RN provided number to call report Tracy Dickerson waiver with Verbal consent from guardian Tracy Dickerson signed and in chart Guardian Tracy Dickerson notified of discharge  Tracy Dickerson rep payee with purpose a new paid $144 invoice for safe transport.   Tracy Dickerson with Safe transport here now to provide transportation Final next level of care: Long Term Nursing Home Barriers to Discharge: Continued Medical Work up   Patient Goals and CMS Choice Patient states their goals for this hospitalization and ongoing recovery are:: patient has dementia and is confused CMS Medicare.gov Compare Post Acute Care list provided to:: Legal Guardian Choice offered to / list presented to : NA      Discharge Placement                       Discharge Plan and Services Additional resources added to the After Visit Summary for   In-house Referral: Clinical Social Work Discharge Planning Services: NA Post Acute Care Choice: Skilled Nursing Facility            DME Agency: NA     Representative spoke with at DME Agency: N/A HH Arranged: NA          Social Drivers of Health (SDOH) Interventions SDOH Screenings   Food Insecurity: No Food Insecurity (11/21/2023)  Housing: Low Risk  (11/21/2023)  Transportation Needs: No Transportation Needs (11/21/2023)  Utilities: Not At Risk (11/21/2023)  Financial Resource Strain: Low Risk  (02/04/2023)   [facility] from [facility], [facility]  Physical Activity: Inactive (07/16/2021)   Received from [facility], [facility]  Social Connections: Patient Unable To Answer (11/21/2023)   Stress: No Stress Concern Present (07/16/2021)   Received from [facility], [facility]  Tobacco Use: Medium Risk (09/04/2023)  Health Literacy: Medium Risk (07/16/2021)   Received from [facility], [facility]     Readmission Risk Interventions     No data to display

## 2023-12-31 NOTE — Plan of Care (Signed)
  Problem: Education: Goal: Knowledge of General Education information will improve Description: Including pain rating scale, medication(s)/side effects and non-pharmacologic comfort measures Outcome: Progressing   Problem: Health Behavior/Discharge Planning: Goal: Ability to manage health-related needs will improve Outcome: Progressing   Problem: Clinical Measurements: Goal: Ability to maintain clinical measurements within normal limits will improve Outcome: Progressing Goal: Will remain free from infection Outcome: Progressing Goal: Diagnostic test results will improve Outcome: Progressing Goal: Respiratory complications will improve Outcome: Progressing Goal: Cardiovascular complication will be avoided Outcome: Progressing   Problem: Nutrition: Goal: Adequate nutrition will be maintained Outcome: Progressing   Problem: Activity: Goal: Risk for activity intolerance will decrease Outcome: Progressing   Problem: Coping: Goal: Level of anxiety will decrease Outcome: Progressing   Problem: Elimination: Goal: Will not experience complications related to bowel motility Outcome: Progressing Goal: Will not experience complications related to urinary retention Outcome: Progressing   Problem: Safety: Goal: Ability to remain free from injury will improve Outcome: Progressing   Problem: Skin Integrity: Goal: Risk for impaired skin integrity will decrease Outcome: Progressing   Problem: Activity: Goal: Ability to tolerate increased activity will improve Outcome: Progressing Goal: Will verbalize the importance of balancing activity with adequate rest periods Outcome: Progressing

## 2023-12-31 NOTE — Discharge Summary (Addendum)
 Physician Discharge Summary   Patient: Tracy Dickerson MRN: [id number] DOB: 06-14-1941  Admit date:     09/04/2023  Discharge date: 12/31/23  Discharge Physician: Lurene Shadow   PCP: Dwana Melena, PA   Recommendations at discharge:   Follow-up with physician at the nursing home within 3 days of discharge  Discharge Diagnoses: Principal Problem:   Atypical pneumonia Active Problems:   ILD (interstitial lung disease) (HCC)   COPD (chronic obstructive pulmonary disease) (HCC)   HTN (hypertension)   HLD (hyperlipidemia)   PAF (paroxysmal atrial fibrillation) (HCC)   CKD stage 3a, GFR 45-59 ml/min (HCC)   Orthostatic hypotension   Lung nodule   Dementia with behavioral disturbance (HCC)  Resolved Problems:   * No resolved hospital problems. *  Hospital Course:  Tracy Dickerson is a 83 y.o. female with medical history significant of hypertension, hyperlipidemia, diabetes mellitus, GERD, breast cancer, CKD-3a, septic shock due to UTI, PAF on Eliquis, orthostatic hypotension on Cortef, presented with SOB   Patient has been in the ED for extended period of time since 09/04/23 due to agitation. Patient was evaluated by psychiatry, waiting for placement.  Per ED physician, morning of 11/21/23, patient appeared to be, weaker, developed cough, shortness of breath mild chest pain, found to have oxygen desaturation to 87% on room air, which improved to 95% on 2 L oxygen.  This was a new oxygen requirement.  Pt therefore was admitted to the hospitalist service.   She is treated for atypical pneumonia.  Condition has improved.    Assessment and Plan:   Acute hypoxemic respiratory failure 2/2 Atypical pneumonia, ILD (interstitial lung disease) and COPD (chronic obstructive pulmonary disease) (HCC):  Patient had 2 L new oxygen requirement.  No leukocytosis or fever.  Clinically not septic. --started on IV Rocephin and azithromycin and Solu-Medrol 80 daily.  Steroid d/c'ed. --on room air by  3/1 Completed antibiotics and steroids. She is tolerating room air.     HTN (hypertension):  BP is optimal   HLD (hyperlipidemia) --cont statin   PAF (paroxysmal atrial fibrillation) (HCC):  --cont Eliquis   CKD stage 3a, GFR 45-59 ml/min [facility]): Renal function stable.   Orthostatic hypotension --cont Cortef   Lung nodule:  This is incidental findings by CT. -Need to follow-up with PCP as outpatient   Dementia with behavioral disturbance (HCC) Insomnia.  -Continue Seroquel at night   Long length of stay Patient now has a legal guardian with DSS         Consultants: None Procedures performed: None Disposition: Long term care facility Diet recommendation:  Discharge Diet Orders (From admission, onward)     Start     Ordered   12/30/23 0000  No added table salt   12/30/23 1455           No added table salt DISCHARGE MEDICATION: Allergies as of 12/31/2023       Reactions   Oxycodone    Percocet [oxycodone-acetaminophen] Itching   Penicillins Rash   Has patient had a PCN reaction causing immediate rash, facial/tongue/throat swelling, SOB or lightheadedness with hypotension: Yes Has patient had a PCN reaction causing severe rash involving mucus membranes or skin necrosis: Unknown Has patient had a PCN reaction that required hospitalization: Unknown Has patient had a PCN reaction occurring within the last 10 years: No If all of the above answers are "NO", then may proceed with Cephalosporin use.        Medication List     STOP  taking these medications    midodrine 5 MG tablet Commonly known as: PROAMATINE   triamcinolone ointment 0.1 % Commonly known as: KENALOG   Vitamin D (Ergocalciferol) 1.25 MG (50000 UNIT) Caps capsule Commonly known as: DRISDOL       TAKE these medications    acetaminophen 500 MG tablet Commonly known as: TYLENOL Take 500 mg by mouth every 6 (six) hours as needed.   apixaban 5 MG Tabs tablet Commonly known as:  ELIQUIS Take 1 tablet (5 mg total) by mouth 2 (two) times daily.   atorvastatin 20 MG tablet Commonly known as: LIPITOR Take 20 mg by mouth daily.   bisacodyl 10 MG suppository Commonly known as: DULCOLAX Place 1 suppository (10 mg total) rectally daily as needed for severe constipation.   cetirizine 5 MG tablet Commonly known as: ZYRTEC Take 1 tablet (5 mg total) by mouth daily.   cyanocobalamin 1000 MCG tablet Commonly known as: VITAMIN B12 Take 1 tablet (1,000 mcg total) by mouth daily.   fluticasone-salmeterol 250-50 MCG/ACT Aepb Commonly known as: ADVAIR Inhale 1 puff into the lungs in the morning and at bedtime.   folic acid 1 MG tablet Commonly known as: FOLVITE Take 1 tablet (1 mg total) by mouth daily.   gabapentin 300 MG capsule Commonly known as: NEURONTIN Take 2 capsules (600 mg total) by mouth at bedtime.   hydrocortisone 5 MG tablet Commonly known as: CORTEF Take 1 tablet (5 mg total) by mouth daily.   hydrOXYzine 10 MG tablet Commonly known as: ATARAX Take 1 tablet (10 mg total) by mouth 3 (three) times daily as needed for itching.   meclizine 12.5 MG tablet Commonly known as: ANTIVERT Take 1 tablet (12.5 mg total) by mouth 3 (three) times daily as needed for dizziness.   ondansetron 4 MG disintegrating tablet Commonly known as: Zofran ODT Take 1 tablet (4 mg total) by mouth every 8 (eight) hours as needed.   pantoprazole 40 MG tablet Commonly known as: Protonix Take 1 tablet (40 mg total) by mouth 2 (two) times daily.   ProAir HFA 108 (90 Base) MCG/ACT inhaler Generic drug: albuterol Inhale 2 puffs into the lungs every 4 (four) hours as needed for wheezing or shortness of breath.   QUEtiapine 25 MG tablet Commonly known as: SEROQUEL Take 1 tablet (25 mg total) by mouth at bedtime.   senna-docusate 8.6-50 MG tablet Commonly known as: Senokot-S Take 2 tablets by mouth 2 (two) times daily.   Spiriva HandiHaler 18 MCG inhalation  capsule Generic drug: tiotropium Place 1 capsule (18 mcg total) into inhaler and inhale daily.   traMADol 50 MG tablet Commonly known as: ULTRAM Take 1 tablet (50 mg total) by mouth 2 (two) times daily as needed.        Follow-up Information     Dwana Melena, [place]. Schedule an appointment as soon as possible for a visit in 1 week(s).   Specialty: Internal Medicine Contact information: [address] [place] [address] [place] [address] [phone]                Discharge Exam: Ceasar Mons Weights   09/04/23 1035 11/28/23 2010  Weight: 90.7 kg 90.7 kg   GEN: NAD SKIN: Warm and dry EYES: No pallor or icterus ENT: MMM CV: RRR PULM: CTA B ABD: soft, ND, NT, +BS CNS: AAO x 1 (person), non focal  EXT: No edema or tenderness   Condition at discharge: stable  The results of significant diagnostics from this hospitalization (  including imaging, microbiology, ancillary and laboratory) are listed below for reference.   Imaging Studies: No results found.  Microbiology: Results for orders placed or performed during the hospital encounter of 09/04/23  Resp panel by RT-PCR (RSV, Flu A&B, Covid) Anterior Nasal Swab     Status: None   Collection Time: 11/08/23  7:53 PM   Specimen: Anterior Nasal Swab  Result Value Ref Range Status   SARS Coronavirus 2 by RT PCR NEGATIVE NEGATIVE Final    Comment: (NOTE) SARS-CoV-2 target nucleic acids are NOT DETECTED.  The SARS-CoV-2 RNA is generally detectable in upper respiratory specimens during the acute phase of infection. The lowest concentration of SARS-CoV-2 viral copies this assay can detect is 138 copies/mL. A negative result does not preclude SARS-Cov-2 infection and should not be used as the sole basis for treatment or other patient management decisions. A negative result may occur with  improper specimen collection/handling, submission of specimen other than nasopharyngeal swab, presence of viral mutation(s) within  the areas targeted by this assay, and inadequate number of viral copies(<138 copies/mL). A negative result must be combined with clinical observations, patient history, and epidemiological information. The expected result is Negative.  Fact Sheet for Patients:  BloggerCourse.com  Fact Sheet for Healthcare Providers:  SeriousBroker.it  This test is no t yet approved or cleared by the [place] FDA and  has been authorized for detection and/or diagnosis of SARS-CoV-2 by FDA under an Emergency Use Authorization (EUA). This EUA will remain  in effect (meaning this test can be used) for the duration of the COVID-19 declaration under Section 564(b)(1) of the Act, 21 U.S.C.section 360bbb-3(b)(1), unless the authorization is terminated  or revoked sooner.       Influenza A by PCR NEGATIVE NEGATIVE Final   Influenza B by PCR NEGATIVE NEGATIVE Final    Comment: (NOTE) The Xpert Xpress SARS-CoV-2/FLU/RSV plus assay is intended as an aid in the diagnosis of influenza from Nasopharyngeal swab specimens and should not be used as a sole basis for treatment. Nasal washings and aspirates are unacceptable for Xpert Xpress SARS-CoV-2/FLU/RSV testing.  Fact Sheet for Patients: BloggerCourse.com  Fact Sheet for Healthcare Providers: SeriousBroker.it  This test is not yet approved or cleared by the [place] FDA and has been authorized for detection and/or diagnosis of SARS-CoV-2 by FDA under an Emergency Use Authorization (EUA). This EUA will remain in effect (meaning this test can be used) for the duration of the COVID-19 declaration under Section 564(b)(1) of the Act, 21 U.S.C. section 360bbb-3(b)(1), unless the authorization is terminated or revoked.     Resp Syncytial Virus by PCR NEGATIVE NEGATIVE Final    Comment: (NOTE) Fact Sheet for  Patients: BloggerCourse.com  Fact Sheet for Healthcare Providers: SeriousBroker.it  This test is not yet approved or cleared by the [place] FDA and has been authorized for detection and/or diagnosis of SARS-CoV-2 by FDA under an Emergency Use Authorization (EUA). This EUA will remain in effect (meaning this test can be used) for the duration of the COVID-19 declaration under Section 564(b)(1) of the Act, 21 U.S.C. section 360bbb-3(b)(1), unless the authorization is terminated or revoked.  Performed at [facility], [address] [place], [place] [address]   Resp panel by RT-PCR (RSV, Flu A&B, Covid) Anterior Nasal Swab     Status: None   Collection Time: 11/21/23  2:36 PM   Specimen: Anterior Nasal Swab  Result Value Ref Range Status   SARS Coronavirus 2 by RT PCR NEGATIVE NEGATIVE  Final    Comment: (NOTE) SARS-CoV-2 target nucleic acids are NOT DETECTED.  The SARS-CoV-2 RNA is generally detectable in upper respiratory specimens during the acute phase of infection. The lowest concentration of SARS-CoV-2 viral copies this assay can detect is 138 copies/mL. A negative result does not preclude SARS-Cov-2 infection and should not be used as the sole basis for treatment or other patient management decisions. A negative result may occur with  improper specimen collection/handling, submission of specimen other than nasopharyngeal swab, presence of viral mutation(s) within the areas targeted by this assay, and inadequate number of viral copies(<138 copies/mL). A negative result must be combined with clinical observations, patient history, and epidemiological information. The expected result is Negative.  Fact Sheet for Patients:  BloggerCourse.com  Fact Sheet for Healthcare Providers:  SeriousBroker.it  This test is no t yet approved or cleared by the [place]  FDA and  has been authorized for detection and/or diagnosis of SARS-CoV-2 by FDA under an Emergency Use Authorization (EUA). This EUA will remain  in effect (meaning this test can be used) for the duration of the COVID-19 declaration under Section 564(b)(1) of the Act, 21 U.S.C.section 360bbb-3(b)(1), unless the authorization is terminated  or revoked sooner.       Influenza A by PCR NEGATIVE NEGATIVE Final   Influenza B by PCR NEGATIVE NEGATIVE Final    Comment: (NOTE) The Xpert Xpress SARS-CoV-2/FLU/RSV plus assay is intended as an aid in the diagnosis of influenza from Nasopharyngeal swab specimens and should not be used as a sole basis for treatment. Nasal washings and aspirates are unacceptable for Xpert Xpress SARS-CoV-2/FLU/RSV testing.  Fact Sheet for Patients: BloggerCourse.com  Fact Sheet for Healthcare Providers: SeriousBroker.it  This test is not yet approved or cleared by the [place] FDA and has been authorized for detection and/or diagnosis of SARS-CoV-2 by FDA under an Emergency Use Authorization (EUA). This EUA will remain in effect (meaning this test can be used) for the duration of the COVID-19 declaration under Section 564(b)(1) of the Act, 21 U.S.C. section 360bbb-3(b)(1), unless the authorization is terminated or revoked.     Resp Syncytial Virus by PCR NEGATIVE NEGATIVE Final    Comment: (NOTE) Fact Sheet for Patients: BloggerCourse.com  Fact Sheet for Healthcare Providers: SeriousBroker.it  This test is not yet approved or cleared by the [place] FDA and has been authorized for detection and/or diagnosis of SARS-CoV-2 by FDA under an Emergency Use Authorization (EUA). This EUA will remain in effect (meaning this test can be used) for the duration of the COVID-19 declaration under Section 564(b)(1) of the Act, 21 U.S.C. section  360bbb-3(b)(1), unless the authorization is terminated or revoked.  Performed at [facility], [address] [place], [place] [address]   Blood culture (routine x 2)     Status: None   Collection Time: 11/21/23  6:09 PM   Specimen: BLOOD  Result Value Ref Range Status   Specimen Description BLOOD LEFT ANTECUBITAL  Final   Special Requests   Final    BOTTLES DRAWN AEROBIC AND ANAEROBIC Blood Culture results may not be optimal due to an inadequate volume of blood received in culture bottles   Culture   Final    NO GROWTH 5 DAYS Performed at [facility], [address], [place], [place] [address]    Report Status 11/26/2023 FINAL  Final  Blood culture (routine x 2)     Status: None   Collection Time: 11/21/23  6:09 PM  Specimen: BLOOD  Result Value Ref Range Status   Specimen Description BLOOD BLOOD LEFT FOREARM  Final   Special Requests   Final    BOTTLES DRAWN AEROBIC AND ANAEROBIC Blood Culture results may not be optimal due to an inadequate volume of blood received in culture bottles   Culture   Final    NO GROWTH 5 DAYS Performed at [facility], [address] [place], [place] [address]    Report Status 11/26/2023 FINAL  Final  SARS Coronavirus 2 by RT PCR (hospital order, performed in [facility] hospital lab) *cepheid single result test* Anterior Nasal Swab     Status: None   Collection Time: 12/01/23  1:58 PM   Specimen: Anterior Nasal Swab  Result Value Ref Range Status   SARS Coronavirus 2 by RT PCR NEGATIVE NEGATIVE Final    Comment: (NOTE) SARS-CoV-2 target nucleic acids are NOT DETECTED.  The SARS-CoV-2 RNA is generally detectable in upper and lower respiratory specimens during the acute phase of infection. The lowest concentration of SARS-CoV-2 viral copies this assay can detect is 250 copies / mL. A negative result does not preclude SARS-CoV-2 infection and should not be used as the sole basis for treatment or other patient  management decisions.  A negative result may occur with improper specimen collection / handling, submission of specimen other than nasopharyngeal swab, presence of viral mutation(s) within the areas targeted by this assay, and inadequate number of viral copies (<250 copies / mL). A negative result must be combined with clinical observations, patient history, and epidemiological information.  Fact Sheet for Patients:   RoadLapTop.co.za  Fact Sheet for Healthcare Providers: http://kim-miller.com/  This test is not yet approved or  cleared by the [place] FDA and has been authorized for detection and/or diagnosis of SARS-CoV-2 by FDA under an Emergency Use Authorization (EUA).  This EUA will remain in effect (meaning this test can be used) for the duration of the COVID-19 declaration under Section 564(b)(1) of the Act, 21 U.S.C. section 360bbb-3(b)(1), unless the authorization is terminated or revoked sooner.  Performed at [facility], [address] [place], [place] [address]     Labs: CBC: Recent Labs  Lab 12/27/23 0437  WBC 6.6  HGB 10.1*  HCT 30.5*  MCV 92.1  PLT 152   Basic Metabolic Panel: Recent Labs  Lab 12/27/23 0437  CREATININE 0.97   Liver Function Tests: No results for input(s): "AST", "ALT", "ALKPHOS", "BILITOT", "PROT", "ALBUMIN" in the last 168 hours. CBG: No results for input(s): "GLUCAP" in the last 168 hours.  Discharge time spent: greater than 30 minutes.  Signed: Lurene Shadow, MD [facility] 12/31/2023

## 2024-02-25 ENCOUNTER — Encounter (HOSPITAL_COMMUNITY): Payer: Self-pay

## 2024-02-25 ENCOUNTER — Emergency Department (HOSPITAL_COMMUNITY)

## 2024-02-25 ENCOUNTER — Other Ambulatory Visit: Payer: Self-pay

## 2024-02-25 ENCOUNTER — Emergency Department (HOSPITAL_COMMUNITY)
Admission: EM | Admit: 2024-02-25 | Discharge: 2024-02-25 | Disposition: A | Discharge status: Home / Self Care | Attending: Emergency Medicine | Admitting: Emergency Medicine

## 2024-02-25 DIAGNOSIS — R1084 Generalized abdominal pain: Secondary | ICD-10-CM | POA: Diagnosis present

## 2024-02-25 DIAGNOSIS — Z7901 Long term (current) use of anticoagulants: Secondary | ICD-10-CM | POA: Diagnosis not present

## 2024-02-25 LAB — COMPREHENSIVE METABOLIC PANEL WITH GFR
ALT: 9 U/L (ref 0–44)
AST: 27 U/L (ref 15–41)
Albumin: 3.5 g/dL (ref 3.5–5.0)
Alkaline Phosphatase: 97 U/L (ref 38–126)
Anion gap: 8 (ref 5–15)
BUN: 13 mg/dL (ref 8–23)
CO2: 26 mmol/L (ref 22–32)
Calcium: 8.9 mg/dL (ref 8.9–10.3)
Chloride: 104 mmol/L (ref 98–111)
Creatinine, Ser: 0.99 mg/dL (ref 0.44–1.00)
GFR, Estimated: 58 mL/min — ABNORMAL LOW (ref >60)
Glucose, Bld: 90 mg/dL (ref 70–99)
Potassium: 4.5 mmol/L (ref 3.5–5.1)
Sodium: 138 mmol/L (ref 135–145)
Total Bilirubin: 0.9 mg/dL (ref 0.0–1.2)
Total Protein: 6.9 g/dL (ref 6.5–8.1)

## 2024-02-25 LAB — CBC WITH DIFFERENTIAL/PLATELET
Abs Immature Granulocytes: 0.02 10*3/uL (ref 0.00–0.07)
Basophils Absolute: 0.1 10*3/uL (ref 0.0–0.1)
Basophils Relative: 1 %
Eosinophils Absolute: 0.3 10*3/uL (ref 0.0–0.5)
Eosinophils Relative: 5 %
HCT: 39.5 % (ref 36.0–46.0)
Hemoglobin: 13.1 g/dL (ref 12.0–15.0)
Immature Granulocytes: 0 %
Lymphocytes Relative: 21 %
Lymphs Abs: 1.5 10*3/uL (ref 0.7–4.0)
MCH: 32.3 pg (ref 26.0–34.0)
MCHC: 33.2 g/dL (ref 30.0–36.0)
MCV: 97.5 fL (ref 80.0–100.0)
Monocytes Absolute: 0.5 10*3/uL (ref 0.1–1.0)
Monocytes Relative: 7 %
Neutro Abs: 4.7 10*3/uL (ref 1.7–7.7)
Neutrophils Relative %: 66 %
Platelets: 197 10*3/uL (ref 150–400)
RBC: 4.05 MIL/uL (ref 3.87–5.11)
RDW: 14.6 % (ref 11.5–15.5)
WBC: 7.2 10*3/uL (ref 4.0–10.5)
nRBC: 0 % (ref 0.0–0.2)

## 2024-02-25 LAB — URINALYSIS, W/ REFLEX TO CULTURE (INFECTION SUSPECTED)
Bilirubin Urine: NEGATIVE
Glucose, UA: NEGATIVE mg/dL
Hgb urine dipstick: NEGATIVE
Ketones, ur: NEGATIVE mg/dL
Leukocytes,Ua: NEGATIVE
Nitrite: NEGATIVE
Protein, ur: NEGATIVE mg/dL
Specific Gravity, Urine: 1.006 (ref 1.005–1.030)
pH: 6 (ref 5.0–8.0)

## 2024-02-25 LAB — LIPASE, BLOOD: Lipase: 27 U/L (ref 11–51)

## 2024-02-25 MED ORDER — HALOPERIDOL LACTATE 5 MG/ML IJ SOLN
2.5000 mg | Freq: Once | INTRAMUSCULAR | Status: AC
  Administered 2024-02-25: 2.5 mg via INTRAMUSCULAR
  Filled 2024-02-25: qty 1

## 2024-02-25 MED ORDER — IOHEXOL 300 MG/ML  SOLN
100.0000 mL | Freq: Once | INTRAMUSCULAR | Status: AC | PRN
  Administered 2024-02-25: 100 mL via INTRAVENOUS

## 2024-02-25 NOTE — ED Notes (Addendum)
 Pt. Assisted to bathroom for safety with one assist and back to her bed.

## 2024-02-25 NOTE — Discharge Instructions (Addendum)
 Return for any problem.  ?

## 2024-02-25 NOTE — ED Triage Notes (Signed)
 Pt arrived via [facility] from [facility] memory care, facility reports ab pain, aaox1 self only. 140/70bp 116cbg 70HR

## 2024-02-25 NOTE — ED Provider Notes (Signed)
 [place] EMERGENCY DEPARTMENT AT [facility] Provider Note   CSN: [id number] Arrival date & time: 02/25/24  1621     History  Chief Complaint  Patient presents with   Abdominal Pain    CHAYIL GANTT is a 83 y.o. female.  83 year old female with prior medical history as detailed below presents for evaluation.  Patient resides at [facility] memory unit.  Patient with complaint of nonspecific abdominal pain.  Patient is pleasantly confused at baseline.  She intermittently complains of constipation.  She is easily distractible.  Patient is unable to provide additional details of recent history.  The history is provided by the patient.       Home Medications Prior to Admission medications   Medication Sig Start Date End Date Taking? Authorizing Provider  acetaminophen  (TYLENOL ) 500 MG tablet Take 500 mg by mouth every 6 (six) hours as needed.    [provider]  albuterol  (PROAIR  HFA) 108 (90 Base) MCG/ACT inhaler Inhale 2 puffs into the lungs every 4 (four) hours as needed for wheezing or shortness of breath.     [provider]  apixaban  (ELIQUIS ) 5 MG TABS tablet Take 1 tablet (5 mg total) by mouth 2 (two) times daily. 06/20/23   Wouk, Haynes Lips, MD  atorvastatin  (LIPITOR) 20 MG tablet Take 20 mg by mouth daily. 05/01/20   [provider]  bisacodyl  (DULCOLAX) 10 MG suppository Place 1 suppository (10 mg total) rectally daily as needed for severe constipation. 09/03/23   Amin, Sumayya, MD  cetirizine  (ZYRTEC ) 5 MG tablet Take 1 tablet (5 mg total) by mouth daily. 03/25/23 03/24/24  Buell Carmin, MD  cyanocobalamin  (VITAMIN B12) 1000 MCG tablet Take 1 tablet (1,000 mcg total) by mouth daily. 06/20/23   Wouk, Haynes Lips, MD  fluticasone-salmeterol (ADVAIR) 250-50 MCG/ACT AEPB Inhale 1 puff into the lungs in the morning and at bedtime.    [provider]  folic acid  (FOLVITE ) 1 MG tablet Take 1 tablet (1 mg total) by mouth daily. 09/04/23    Amin, Sumayya, MD  gabapentin  (NEURONTIN ) 300 MG capsule Take 2 capsules (600 mg total) by mouth at bedtime. 09/03/23   Luna Salinas, MD  hydrocortisone  (CORTEF ) 5 MG tablet Take 1 tablet (5 mg total) by mouth daily. 09/03/23   Amin, Sumayya, MD  hydrOXYzine  (ATARAX ) 10 MG tablet Take 1 tablet (10 mg total) by mouth 3 (three) times daily as needed for itching. 09/03/23   Amin, Sumayya, MD  meclizine  (ANTIVERT ) 12.5 MG tablet Take 1 tablet (12.5 mg total) by mouth 3 (three) times daily as needed for dizziness. 07/03/23   Bradler, Evan K, MD  ondansetron  (ZOFRAN  ODT) 4 MG disintegrating tablet Take 1 tablet (4 mg total) by mouth every 8 (eight) hours as needed. 06/25/21   Menshew, Raye Cai, PA-C  pantoprazole  (PROTONIX ) 40 MG tablet Take 1 tablet (40 mg total) by mouth 2 (two) times daily. 06/20/23 06/19/24  Wouk, Haynes Lips, MD  QUEtiapine  (SEROQUEL ) 25 MG tablet Take 1 tablet (25 mg total) by mouth at bedtime. 12/30/23   Sreenath, Daymon Evans, MD  senna-docusate (SENOKOT-S) 8.6-50 MG tablet Take 2 tablets by mouth 2 (two) times daily. 12/30/23   Tiajuana Fluke, MD  SPIRIVA  HANDIHALER 18 MCG inhalation capsule Place 1 capsule (18 mcg total) into inhaler and inhale daily. 02/09/22   Woods, Jaclyn M, PA-C  traMADol  (ULTRAM ) 50 MG tablet Take 1 tablet (50 mg total) by mouth 2 (two) times daily as needed. 09/03/23  Luna Salinas, MD      Allergies    Oxycodone , Percocet [oxycodone -acetaminophen ], and Penicillins    Review of Systems   Review of Systems  All other systems reviewed and are negative.   Physical Exam Updated Vital Signs BP (!) 153/72 (BP Location: Right Arm)   Pulse 66   Temp 97.9 F (36.6 C) (Oral)   Resp 18   SpO2 97%  Physical Exam Vitals and nursing note reviewed.  Constitutional:      General: She is not in acute distress.    Appearance: Normal appearance. She is well-developed.  HENT:     Head: Normocephalic and atraumatic.  Eyes:     Conjunctiva/sclera:  Conjunctivae normal.     Pupils: Pupils are equal, round, and reactive to light.  Cardiovascular:     Rate and Rhythm: Normal rate and regular rhythm.     Heart sounds: Normal heart sounds.  Pulmonary:     Effort: Pulmonary effort is normal. No respiratory distress.     Breath sounds: Normal breath sounds.  Abdominal:     General: There is no distension.     Palpations: Abdomen is soft.     Tenderness: There is no abdominal tenderness.  Musculoskeletal:        General: No deformity. Normal range of motion.     Cervical back: Normal range of motion and neck supple.  Skin:    General: Skin is warm and dry.  Neurological:     General: No focal deficit present.     Mental Status: She is alert and oriented to person, place, and time.     ED Results / Procedures / Treatments   Labs (all labs ordered are listed, but only abnormal results are displayed) Labs Reviewed  COMPREHENSIVE METABOLIC PANEL WITH GFR - Abnormal; Notable for the following components:      Result Value   GFR, Estimated 58 (*)    All other components within normal limits  URINALYSIS, W/ REFLEX TO CULTURE (INFECTION SUSPECTED) - Abnormal; Notable for the following components:   Bacteria, UA RARE (*)    All other components within normal limits  LIPASE, BLOOD  CBC WITH DIFFERENTIAL/PLATELET    EKG EKG Interpretation Date/Time:  Wednesday February 25 2024 16:33:01 EDT Ventricular Rate:  72 PR Interval:  188 QRS Duration:  113 QT Interval:  405 QTC Calculation: 444 R Axis:   -28  Text Interpretation: Sinus rhythm Incomplete left bundle branch block Confirmed by Angela Kell [phone]) on 02/25/2024 4:59:22 PM  Radiology CT ABDOMEN PELVIS W CONTRAST Result Date: 02/25/2024 CLINICAL DATA:  Abdominal pain. EXAM: CT ABDOMEN AND PELVIS WITH CONTRAST TECHNIQUE: Multidetector CT imaging of the abdomen and pelvis was performed using the standard protocol following bolus administration of intravenous contrast. RADIATION  DOSE REDUCTION: This exam was performed according to the departmental dose-optimization program which includes automated exposure control, adjustment of the mA and/or kV according to patient size and/or use of iterative reconstruction technique. CONTRAST:  OMNIPAQUE  IOHEXOL  300 MG/ML  SOLN COMPARISON:  Noncontrast CT 11/01/2023 FINDINGS: Lower chest: Chronic lung disease at the bases. No acute findings allowing for motion artifact. The heart is enlarged. Hepatobiliary: No focal liver abnormality is seen. No gallstones, gallbladder wall thickening, or biliary dilatation. Pancreas: Fatty atrophy.  No ductal dilatation or inflammation. Spleen: Normal in size without focal abnormality. Adrenals/Urinary Tract: Normal adrenal glands. No hydronephrosis. There are multiple bilateral renal cysts, larger and more prevalent on the right. These are not significantly changed from  prior exam. No renal calculi. Unremarkable urinary bladder. Stomach/Bowel: Decompressed stomach. No bowel obstruction or inflammation. The appendix is not visualized. Left colonic diverticulosis. No diverticulitis or acute colonic inflammation. Small volume of colonic stool. Vascular/Lymphatic: Aortic atherosclerosis. 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 No aortic inflammation. Patent portal vein. No abdominopelvic adenopathy. Reproductive: Status post hysterectomy. No adnexal masses. Other: No free air or ascites. Small fat containing umbilical and bilateral inguinal hernias. Musculoskeletal: L1 compression fracture with approximately 60% loss of height is new from February 2025. The bones are subjectively under mineralized. IMPRESSION: 1. No acute abnormality in the abdomen/pelvis. 2. L1 compression fracture with approximately 60% loss of height is new from February 2025. 3. Colonic diverticulosis without diverticulitis. 4. 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 Recommend follow-up every 3 years. Aortic Atherosclerosis (ICD10-I70.0).  Electronically Signed   By: Chadwick Colonel M.D.   On: 02/25/2024 21:24    Procedures Procedures    Medications Ordered in ED Medications  haloperidol  lactate (HALDOL ) injection 2.5 mg (2.5 mg Intramuscular Given 02/25/24 1924)  iohexol  (OMNIPAQUE ) 300 MG/ML solution 100 mL (100 mLs Intravenous Contrast Given 02/25/24 2058)    ED Course/ Medical Decision Making/ A&P                                 Medical Decision Making Amount and/or Complexity of Data Reviewed Labs: ordered. Radiology: ordered.  Risk Prescription drug management.    Medical Screen Complete  This patient presented to the ED with complaint of abdominal pain.  This complaint involves an extensive number of treatment options. The initial differential diagnosis includes, but is not limited to, constipation, intra-abdominal infection, urinary tract infection, etc.  This presentation is: Acute, Chronic, Self-Limited, Previously Undiagnosed, Uncertain Prognosis, Complicated, Systemic Symptoms, and Threat to Life/Bodily Function  Patient with known history of dementia presents with nonspecific abdominal pain.  Obtained workup is without significant acute abnormality identified.    Screening labs obtained are without significant abnormality.  CT imaging is also with specific acute abnormality.  Case discussed with patient's guardian who agrees with plan for discharge of patient back to her facility.  Additional history obtained:  External records from outside sources obtained and reviewed including prior ED visits and prior Inpatient records.    Problem List / ED Course:  Abdominal pain   Reevaluation:  After the interventions noted above, I reevaluated the patient and found that they have: improved   Disposition:  After consideration of the diagnostic results and the patients response to treatment, I feel that the patent would benefit from close outpatient followup.          Final Clinical  Impression(s) / ED Diagnoses Final diagnoses:  Generalized abdominal pain    Rx / DC Orders ED Discharge Orders     None         Burnette Carte, MD 02/25/24 2156

## 2024-03-26 ENCOUNTER — Other Ambulatory Visit: Payer: Self-pay

## 2024-03-26 ENCOUNTER — Inpatient Hospital Stay (HOSPITAL_COMMUNITY)
Admission: EM | Admit: 2024-03-26 | Discharge: 2024-03-28 | DRG: 871 | Disposition: A | Discharge status: Nursing Home (Includes ICF) | Source: Skilled Nursing Facility | Attending: Internal Medicine | Admitting: Internal Medicine

## 2024-03-26 ENCOUNTER — Encounter (HOSPITAL_COMMUNITY): Payer: Self-pay | Admitting: Internal Medicine

## 2024-03-26 ENCOUNTER — Emergency Department (HOSPITAL_COMMUNITY)

## 2024-03-26 DIAGNOSIS — A419 Sepsis, unspecified organism: Principal | ICD-10-CM | POA: Diagnosis present

## 2024-03-26 DIAGNOSIS — E119 Type 2 diabetes mellitus without complications: Secondary | ICD-10-CM | POA: Diagnosis present

## 2024-03-26 DIAGNOSIS — I10 Essential (primary) hypertension: Secondary | ICD-10-CM | POA: Diagnosis present

## 2024-03-26 DIAGNOSIS — J440 Chronic obstructive pulmonary disease with acute lower respiratory infection: Secondary | ICD-10-CM | POA: Diagnosis present

## 2024-03-26 DIAGNOSIS — I2510 Atherosclerotic heart disease of native coronary artery without angina pectoris: Secondary | ICD-10-CM | POA: Diagnosis present

## 2024-03-26 DIAGNOSIS — E785 Hyperlipidemia, unspecified: Secondary | ICD-10-CM | POA: Diagnosis present

## 2024-03-26 DIAGNOSIS — Z85118 Personal history of other malignant neoplasm of bronchus and lung: Secondary | ICD-10-CM

## 2024-03-26 DIAGNOSIS — Z87891 Personal history of nicotine dependence: Secondary | ICD-10-CM | POA: Diagnosis not present

## 2024-03-26 DIAGNOSIS — Z79899 Other long term (current) drug therapy: Secondary | ICD-10-CM | POA: Diagnosis not present

## 2024-03-26 DIAGNOSIS — J449 Chronic obstructive pulmonary disease, unspecified: Secondary | ICD-10-CM | POA: Diagnosis present

## 2024-03-26 DIAGNOSIS — F03918 Unspecified dementia, unspecified severity, with other behavioral disturbance: Secondary | ICD-10-CM | POA: Diagnosis not present

## 2024-03-26 DIAGNOSIS — R059 Cough, unspecified: Secondary | ICD-10-CM | POA: Diagnosis present

## 2024-03-26 DIAGNOSIS — Z7951 Long term (current) use of inhaled steroids: Secondary | ICD-10-CM | POA: Diagnosis not present

## 2024-03-26 DIAGNOSIS — K219 Gastro-esophageal reflux disease without esophagitis: Secondary | ICD-10-CM | POA: Diagnosis present

## 2024-03-26 DIAGNOSIS — Z90710 Acquired absence of both cervix and uterus: Secondary | ICD-10-CM | POA: Diagnosis not present

## 2024-03-26 DIAGNOSIS — I482 Chronic atrial fibrillation, unspecified: Secondary | ICD-10-CM | POA: Insufficient documentation

## 2024-03-26 DIAGNOSIS — I480 Paroxysmal atrial fibrillation: Secondary | ICD-10-CM | POA: Diagnosis present

## 2024-03-26 DIAGNOSIS — Z7901 Long term (current) use of anticoagulants: Secondary | ICD-10-CM | POA: Diagnosis not present

## 2024-03-26 DIAGNOSIS — J189 Pneumonia, unspecified organism: Secondary | ICD-10-CM | POA: Diagnosis present

## 2024-03-26 DIAGNOSIS — R4182 Altered mental status, unspecified: Secondary | ICD-10-CM | POA: Diagnosis present

## 2024-03-26 DIAGNOSIS — Z853 Personal history of malignant neoplasm of breast: Secondary | ICD-10-CM | POA: Diagnosis not present

## 2024-03-26 DIAGNOSIS — J8410 Pulmonary fibrosis, unspecified: Secondary | ICD-10-CM | POA: Diagnosis present

## 2024-03-26 DIAGNOSIS — R509 Fever, unspecified: Secondary | ICD-10-CM | POA: Diagnosis present

## 2024-03-26 LAB — CBC WITH DIFFERENTIAL/PLATELET
Abs Immature Granulocytes: 0.08 K/uL — ABNORMAL HIGH (ref 0.00–0.07)
Basophils Absolute: 0 K/uL (ref 0.0–0.1)
Basophils Relative: 0 %
Eosinophils Absolute: 0 K/uL (ref 0.0–0.5)
Eosinophils Relative: 0 %
HCT: 41.2 % (ref 36.0–46.0)
Hemoglobin: 13.4 g/dL (ref 12.0–15.0)
Immature Granulocytes: 1 %
Lymphocytes Relative: 5 %
Lymphs Abs: 0.7 K/uL (ref 0.7–4.0)
MCH: 30.7 pg (ref 26.0–34.0)
MCHC: 32.5 g/dL (ref 30.0–36.0)
MCV: 94.5 fL (ref 80.0–100.0)
Monocytes Absolute: 0.7 K/uL (ref 0.1–1.0)
Monocytes Relative: 5 %
Neutro Abs: 12.6 K/uL — ABNORMAL HIGH (ref 1.7–7.7)
Neutrophils Relative %: 89 %
Platelets: 182 K/uL (ref 150–400)
RBC: 4.36 MIL/uL (ref 3.87–5.11)
RDW: 15.9 % — ABNORMAL HIGH (ref 11.5–15.5)
WBC: 14 K/uL — ABNORMAL HIGH (ref 4.0–10.5)
nRBC: 0 % (ref 0.0–0.2)

## 2024-03-26 LAB — COMPREHENSIVE METABOLIC PANEL WITH GFR
ALT: 9 U/L (ref 0–44)
AST: 17 U/L (ref 15–41)
Albumin: 3.7 g/dL (ref 3.5–5.0)
Alkaline Phosphatase: 109 U/L (ref 38–126)
Anion gap: 13 (ref 5–15)
BUN: 25 mg/dL — ABNORMAL HIGH (ref 8–23)
CO2: 25 mmol/L (ref 22–32)
Calcium: 9.7 mg/dL (ref 8.9–10.3)
Chloride: 103 mmol/L (ref 98–111)
Creatinine, Ser: 1.38 mg/dL — ABNORMAL HIGH (ref 0.44–1.00)
GFR, Estimated: 39 mL/min — ABNORMAL LOW (ref >60)
Glucose, Bld: 164 mg/dL — ABNORMAL HIGH (ref 70–99)
Potassium: 3.5 mmol/L (ref 3.5–5.1)
Sodium: 141 mmol/L (ref 135–145)
Total Bilirubin: 1.1 mg/dL (ref 0.0–1.2)
Total Protein: 8.2 g/dL — ABNORMAL HIGH (ref 6.5–8.1)

## 2024-03-26 LAB — TSH: TSH: 0.454 u[IU]/mL (ref 0.350–4.500)

## 2024-03-26 LAB — LIPASE, BLOOD: Lipase: 22 U/L (ref 11–51)

## 2024-03-26 LAB — I-STAT CG4 LACTIC ACID, ED
Lactic Acid, Venous: 1.1 mmol/L (ref 0.5–1.9)
Lactic Acid, Venous: 0.7 mmol/L (ref 0.5–1.9)

## 2024-03-26 LAB — RESP PANEL BY RT-PCR (RSV, FLU A&B, COVID)  RVPGX2
Influenza A by PCR: NEGATIVE
Influenza B by PCR: NEGATIVE
Resp Syncytial Virus by PCR: NEGATIVE
SARS Coronavirus 2 by RT PCR: NEGATIVE

## 2024-03-26 LAB — PROTIME-INR
INR: 2 — ABNORMAL HIGH (ref 0.8–1.2)
Prothrombin Time: 23.5 s — ABNORMAL HIGH (ref 11.4–15.2)

## 2024-03-26 MED ORDER — TRAZODONE HCL 50 MG PO TABS
25.0000 mg | ORAL_TABLET | Freq: Every evening | ORAL | Status: DC | PRN
  Administered 2024-03-27 (×2): 25 mg via ORAL
  Filled 2024-03-26 (×4): qty 1

## 2024-03-26 MED ORDER — ONDANSETRON HCL 4 MG/2ML IJ SOLN: 4.0000 mg | Freq: Four times a day (QID) | INTRAMUSCULAR | Status: DC | PRN

## 2024-03-26 MED ORDER — QUETIAPINE FUMARATE 25 MG PO TABS
25.0000 mg | ORAL_TABLET | Freq: Every day | ORAL | Status: DC
  Administered 2024-03-27: 25 mg via ORAL
  Filled 2024-03-26: qty 1

## 2024-03-26 MED ORDER — SODIUM CHLORIDE 0.9 % IV SOLN
2.0000 g | Freq: Once | INTRAVENOUS | Status: AC
  Administered 2024-03-26: 2 g via INTRAVENOUS
  Filled 2024-03-26: qty 20

## 2024-03-26 MED ORDER — VANCOMYCIN HCL IN DEXTROSE 1-5 GM/200ML-% IV SOLN
1000.0000 mg | Freq: Once | INTRAVENOUS | Status: AC
  Administered 2024-03-26: 1000 mg via INTRAVENOUS
  Filled 2024-03-26: qty 200

## 2024-03-26 MED ORDER — GABAPENTIN 300 MG PO CAPS
600.0000 mg | ORAL_CAPSULE | Freq: Every day | ORAL | Status: DC
  Administered 2024-03-26 – 2024-03-27 (×2): 600 mg via ORAL
  Filled 2024-03-26 (×2): qty 2

## 2024-03-26 MED ORDER — FAMOTIDINE 20 MG PO TABS
20.0000 mg | ORAL_TABLET | Freq: Every day | ORAL | Status: DC
  Administered 2024-03-28: 20 mg via ORAL
  Filled 2024-03-26 (×3): qty 1

## 2024-03-26 MED ORDER — ACETAMINOPHEN 650 MG RE SUPP
650.0000 mg | Freq: Once | RECTAL | Status: AC
  Administered 2024-03-26: 650 mg via RECTAL
  Filled 2024-03-26: qty 1

## 2024-03-26 MED ORDER — ALBUTEROL SULFATE (2.5 MG/3ML) 0.083% IN NEBU: 2.5000 mg | INHALATION_SOLUTION | RESPIRATORY_TRACT | Status: DC | PRN

## 2024-03-26 MED ORDER — ACETAMINOPHEN 325 MG PO TABS
650.0000 mg | ORAL_TABLET | Freq: Four times a day (QID) | ORAL | Status: DC | PRN
  Administered 2024-03-27 (×2): 650 mg via ORAL
  Filled 2024-03-26 (×3): qty 2

## 2024-03-26 MED ORDER — SODIUM CHLORIDE 0.9 % IV SOLN: 2.0000 g | INTRAVENOUS | Status: DC

## 2024-03-26 MED ORDER — ACETAMINOPHEN 650 MG RE SUPP: 650.0000 mg | Freq: Four times a day (QID) | RECTAL | Status: DC | PRN

## 2024-03-26 MED ORDER — DEXAMETHASONE SODIUM PHOSPHATE 10 MG/ML IJ SOLN
10.0000 mg | Freq: Once | INTRAMUSCULAR | Status: AC
  Administered 2024-03-26: 10 mg via INTRAVENOUS
  Filled 2024-03-26: qty 1

## 2024-03-26 MED ORDER — ATORVASTATIN CALCIUM 10 MG PO TABS
20.0000 mg | ORAL_TABLET | Freq: Every day | ORAL | Status: DC
  Administered 2024-03-26 – 2024-03-27 (×2): 20 mg via ORAL
  Filled 2024-03-26 (×2): qty 2

## 2024-03-26 MED ORDER — SODIUM CHLORIDE 0.9 % IV SOLN
500.0000 mg | INTRAVENOUS | Status: DC
  Administered 2024-03-26 – 2024-03-27 (×2): 500 mg via INTRAVENOUS
  Filled 2024-03-26 (×3): qty 5

## 2024-03-26 MED ORDER — ONDANSETRON HCL 4 MG PO TABS
4.0000 mg | ORAL_TABLET | Freq: Four times a day (QID) | ORAL | Status: DC | PRN
  Administered 2024-03-27: 4 mg via ORAL
  Filled 2024-03-26: qty 1

## 2024-03-26 MED ORDER — HYDROCORTISONE SOD SUC (PF) 100 MG IJ SOLR
100.0000 mg | Freq: Two times a day (BID) | INTRAMUSCULAR | Status: DC
  Administered 2024-03-26 – 2024-03-28 (×3): 100 mg via INTRAVENOUS
  Filled 2024-03-26 (×4): qty 2

## 2024-03-26 MED ORDER — OXYCODONE HCL 5 MG PO TABS
10.0000 mg | ORAL_TABLET | ORAL | Status: DC | PRN
  Administered 2024-03-27 (×3): 10 mg via ORAL
  Filled 2024-03-26 (×4): qty 2

## 2024-03-26 MED ORDER — TIOTROPIUM BROMIDE MONOHYDRATE 18 MCG IN CAPS: 1.0000 | ORAL_CAPSULE | Freq: Every day | RESPIRATORY_TRACT | Status: DC

## 2024-03-26 MED ORDER — SENNOSIDES-DOCUSATE SODIUM 8.6-50 MG PO TABS
2.0000 | ORAL_TABLET | Freq: Two times a day (BID) | ORAL | Status: DC
  Administered 2024-03-26 – 2024-03-28 (×3): 2 via ORAL
  Filled 2024-03-26 (×5): qty 2

## 2024-03-26 MED ORDER — PANTOPRAZOLE SODIUM 40 MG PO TBEC
40.0000 mg | DELAYED_RELEASE_TABLET | Freq: Two times a day (BID) | ORAL | Status: DC
  Administered 2024-03-26 – 2024-03-28 (×3): 40 mg via ORAL
  Filled 2024-03-26 (×5): qty 1

## 2024-03-26 MED ORDER — LACTATED RINGERS IV BOLUS
1000.0000 mL | Freq: Once | INTRAVENOUS | Status: AC
  Administered 2024-03-26: 1000 mL via INTRAVENOUS

## 2024-03-26 MED ORDER — FLUTICASONE FUROATE-VILANTEROL 200-25 MCG/ACT IN AEPB
1.0000 | INHALATION_SPRAY | Freq: Every day | RESPIRATORY_TRACT | Status: DC
  Filled 2024-03-26: qty 28

## 2024-03-26 MED ORDER — ALBUTEROL SULFATE HFA 108 (90 BASE) MCG/ACT IN AERS: 2.0000 | INHALATION_SPRAY | RESPIRATORY_TRACT | Status: DC | PRN

## 2024-03-26 MED ORDER — LACTATED RINGERS IV SOLN
150.0000 mL/h | INTRAVENOUS | Status: AC
  Administered 2024-03-27: 150 mL/h via INTRAVENOUS

## 2024-03-26 MED ORDER — LACTATED RINGERS IV SOLN: INTRAVENOUS | Status: AC

## 2024-03-26 MED ORDER — LACTATED RINGERS IV BOLUS (SEPSIS)
1760.0000 mL | Freq: Once | INTRAVENOUS | Status: AC
  Administered 2024-03-26: 1760 mL via INTRAVENOUS

## 2024-03-26 MED ORDER — POLYETHYLENE GLYCOL 3350 17 G PO PACK: 17.0000 g | PACK | Freq: Every day | ORAL | Status: DC | PRN

## 2024-03-26 MED ORDER — INSULIN ASPART 100 UNIT/ML IJ SOLN
0.0000 [IU] | Freq: Three times a day (TID) | INTRAMUSCULAR | Status: DC
  Administered 2024-03-27: 3 [IU] via SUBCUTANEOUS
  Administered 2024-03-28 (×2): 2 [IU] via SUBCUTANEOUS
  Filled 2024-03-26: qty 0.15

## 2024-03-26 MED ORDER — IOHEXOL 300 MG/ML  SOLN
80.0000 mL | Freq: Once | INTRAMUSCULAR | Status: AC | PRN
  Administered 2024-03-26: 80 mL via INTRAVENOUS

## 2024-03-26 NOTE — H&P (Signed)
 History and Physical    Patient: Tracy Dickerson FMW:[id number] DOB: Oct 17, 1940 DOA: 03/26/2024 DOS: the patient was seen and examined on 03/26/2024 PCP: Glendia Nelwyn NOVAK, PA  Patient coming from: SNF  Chief Complaint:  Chief Complaint  Patient presents with   Possible Menigitis   HPI: Tracy Dickerson is a 83 y.o. female with medical history significant of dementia, cervical arthritis, breast and lung CA, CAD, and GERD with a guardian who presents from SNF with fever, confusion and neck pain along with very congested cough.  Patient was in her usual state of health until yesterday when she seemed a little bit weaker.  Today she developed a fever and was sent in from the facility.  She is unable to give any useful history.  Her guardian is a court appointed guardian and is unable to give much significant history about the past 2 days. Review of Systems: unable to review all systems due to the inability of the patient to answer questions. Past Medical History:  Diagnosis Date   Cancer (HCC)    Breast and lung   Diabetes mellitus without complication (HCC)    GERD (gastroesophageal reflux disease)    Heart disease    Hypertension    Past Surgical History:  Procedure Laterality Date   ABDOMINAL HYSTERECTOMY     APPENDECTOMY     BREAST SURGERY     breast cancer LEFT   COLONOSCOPY WITH PROPOFOL  N/A 08/23/2023   Procedure: COLONOSCOPY WITH PROPOFOL ;  Surgeon: Unk Corinn Skiff, MD;  Location: [facility] ENDOSCOPY;  Service: Gastroenterology;  Laterality: N/A;   ESOPHAGOGASTRODUODENOSCOPY N/A 08/23/2023   Procedure: ESOPHAGOGASTRODUODENOSCOPY (EGD);  Surgeon: Unk Corinn Skiff, MD;  Location: [facility] ENDOSCOPY;  Service: Gastroenterology;  Laterality: N/A;   ESOPHAGOGASTRODUODENOSCOPY (EGD) WITH PROPOFOL  N/A 01/07/2023   Procedure: ESOPHAGOGASTRODUODENOSCOPY (EGD) WITH PROPOFOL ;  Surgeon: Jinny Carmine, MD;  Location: [facility] ENDOSCOPY;  Service: Endoscopy;  Laterality: N/A;   GIVENS CAPSULE STUDY N/A  08/23/2023   Procedure: GIVENS CAPSULE STUDY;  Surgeon: Unk Corinn Skiff, MD;  Location: [facility] ENDOSCOPY;  Service: Gastroenterology;  Laterality: N/A;   OPEN REDUCTION INTERNAL FIXATION (ORIF) TIBIA/FIBULA FRACTURE Left 02/03/2018   Procedure: OPEN REDUCTION INTERNAL FIXATION (ORIF) DISTAL TIBIA FRACTURE;  Surgeon: Edie Norleen PARAS, MD;  Location: [facility] ORS;  Service: Orthopedics;  Laterality: Left;  ankle/lower leg   TONSILLECTOMY     Social History:  reports that she has quit smoking. She has never used smokeless tobacco. She reports that she does not currently use alcohol. She reports that she does not use drugs.  Allergies  Allergen Reactions   Oxycodone     Penicillins Rash    Has patient had a PCN reaction causing immediate rash, facial/tongue/throat swelling, SOB or lightheadedness with hypotension: Yes Has patient had a PCN reaction causing severe rash involving mucus membranes or skin necrosis: Unknown Has patient had a PCN reaction that required hospitalization: Unknown Has patient had a PCN reaction occurring within the last 10 years: No If all of the above answers are NO, then may proceed with Cephalosporin use.   Percocet [Oxycodone -Acetaminophen ] Itching    Family History  Problem Relation Age of Onset   Cancer Mother     Prior to Admission medications   Medication Sig Start Date End Date Taking? Authorizing Provider  acetaminophen  (TYLENOL ) 500 MG tablet Take 500 mg by mouth every 6 (six) hours as needed.    [provider]  albuterol  (PROAIR  HFA) 108 (90 Base) MCG/ACT inhaler Inhale 2 puffs into the lungs  every 4 (four) hours as needed for wheezing or shortness of breath.     [provider]  apixaban  (ELIQUIS ) 5 MG TABS tablet Take 1 tablet (5 mg total) by mouth 2 (two) times daily. 06/20/23   Wouk, Devaughn Sayres, MD  atorvastatin  (LIPITOR) 20 MG tablet Take 20 mg by mouth daily. 05/01/20   [provider]  bisacodyl  (DULCOLAX) 10 MG suppository  Place 1 suppository (10 mg total) rectally daily as needed for severe constipation. 09/03/23   Caleen Qualia, MD  cetirizine  (ZYRTEC ) 5 MG tablet Take 1 tablet (5 mg total) by mouth daily. 03/25/23 03/24/24  Cyrena Mylar, MD  cyanocobalamin  (VITAMIN B12) 1000 MCG tablet Take 1 tablet (1,000 mcg total) by mouth daily. 06/20/23   Wouk, Devaughn Sayres, MD  fluticasone -salmeterol (ADVAIR) 250-50 MCG/ACT AEPB Inhale 1 puff into the lungs in the morning and at bedtime.    [provider]  folic acid  (FOLVITE ) 1 MG tablet Take 1 tablet (1 mg total) by mouth daily. 09/04/23   Amin, Sumayya, MD  gabapentin  (NEURONTIN ) 300 MG capsule Take 2 capsules (600 mg total) by mouth at bedtime. 09/03/23   Caleen Qualia, MD  hydrocortisone  (CORTEF ) 5 MG tablet Take 1 tablet (5 mg total) by mouth daily. 09/03/23   Amin, Sumayya, MD  hydrOXYzine  (ATARAX ) 10 MG tablet Take 1 tablet (10 mg total) by mouth 3 (three) times daily as needed for itching. 09/03/23   Amin, Sumayya, MD  meclizine  (ANTIVERT ) 12.5 MG tablet Take 1 tablet (12.5 mg total) by mouth 3 (three) times daily as needed for dizziness. 07/03/23   Bradler, Evan K, MD  ondansetron  (ZOFRAN  ODT) 4 MG disintegrating tablet Take 1 tablet (4 mg total) by mouth every 8 (eight) hours as needed. 06/25/21   Menshew, Candida LULLA Kings, PA-C  pantoprazole  (PROTONIX ) 40 MG tablet Take 1 tablet (40 mg total) by mouth 2 (two) times daily. 06/20/23 06/19/24  Wouk, Devaughn Sayres, MD  QUEtiapine  (SEROQUEL ) 25 MG tablet Take 1 tablet (25 mg total) by mouth at bedtime. 12/30/23   Sreenath, Calvin NOVAK, MD  senna-docusate (SENOKOT-S) 8.6-50 MG tablet Take 2 tablets by mouth 2 (two) times daily. 12/30/23   Jhonny Calvin NOVAK, MD  SPIRIVA  HANDIHALER 18 MCG inhalation capsule Place 1 capsule (18 mcg total) into inhaler and inhale daily. 02/09/22   Woods, Jaclyn M, PA-C  traMADol  (ULTRAM ) 50 MG tablet Take 1 tablet (50 mg total) by mouth 2 (two) times daily as needed. 09/03/23   Caleen Qualia, MD     Physical Exam: Vitals:   03/26/24 1807 03/26/24 1810 03/26/24 1813 03/26/24 2026  BP:   125/69 (!) 130/59  Pulse:   (!) 111 93  Resp:   (!) 24 20  Temp:   (!) 101.9 F (38.8 C) 98.7 F (37.1 C)  TempSrc:    Oral  SpO2: 90%  98% 93%  Weight:  92 kg    Height:  5' 6 (1.676 m)     Physical Exam Vitals and nursing note reviewed.  Constitutional:      General: She is not in acute distress.    Appearance: Normal appearance. She is ill-appearing. She is not toxic-appearing.  HENT:     Head: Normocephalic and atraumatic.     Nose: Nose normal. No congestion or rhinorrhea.     Mouth/Throat:     Mouth: Mucous membranes are dry.     Pharynx: Oropharynx is clear. No oropharyngeal exudate.  Eyes:     Conjunctiva/sclera: Conjunctivae normal.  Pupils: Pupils are equal, round, and reactive to light.  Neck:     Comments: After she has been resuscitated. Cardiovascular:     Rate and Rhythm: Normal rate. Rhythm irregular.     Heart sounds: Normal heart sounds. No murmur heard.    No friction rub.  Pulmonary:     Effort: Pulmonary effort is normal. No respiratory distress.     Breath sounds: Rales present. No rhonchi.     Comments:  rhonchi and rales at the bases bilaterally Chest:     Chest wall: Tenderness present.  Abdominal:     General: Abdomen is flat. Bowel sounds are normal.     Palpations: Abdomen is soft.  Musculoskeletal:        General: No swelling or tenderness. Normal range of motion.     Cervical back: Normal range of motion and neck supple. No rigidity or tenderness.     Right lower leg: Edema present.     Left lower leg: Edema present.     Comments: Chronic 3+ bilateral lower extremity edema  Skin:    General: Skin is warm and dry.  Neurological:     General: No focal deficit present.     Mental Status: She is alert. Mental status is at baseline.  Psychiatric:        Mood and Affect: Mood normal.        Behavior: Behavior normal.     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  Associated masslike opacity in the left base of 3.5 x 2.3 cm with recommended follow-up CT in 3 months.  Assessment and Plan: Hospital Problem List as of 03/26/2024          Priority Resolved POA     High     * (Principal) Sepsis [facility]) High  Yes    Current Assessment & Plan 03/26/2024 Hospital Encounter Written 03/26/2024  9:11 PM by Jackee Zebedee BROCKS, MD  Pt with active treatment for sepsis.  She has received fluids per protocol in the ED and looks much better. - admit to inpatient - IV ceftriaxone  and azithro for sepsis d/t pna - monitor lactic acid - doubt meningitis as initially thought in ED given rapid improvement - cont antipyretics prn         Community acquired bilateral lower lobe pneumonia High  Yes    Current Assessment & Plan 03/26/2024 Hospital Encounter Written 03/26/2024  9:12 PM by Jackee Zebedee BROCKS, MD  Azithro and ceftriaxone  per protocol - monitor cough add tessalon  if worsens - monitor fever curve        Sepsis due to pneumonia [facility]) High  Yes    Current Assessment & Plan 03/26/2024 Hospital Encounter Written 03/26/2024  9:15 PM by Jackee Zebedee BROCKS, MD  As above        1.     PAF (paroxysmal atrial fibrillation) (HCC) 1.  Yes    Current Assessment & Plan 03/26/2024 Hospital Encounter Written 03/26/2024  9:14 PM by Jackee Zebedee BROCKS, MD  Hold NOAC as pt may require LP.  At this point seems less likely given CT findings correlate with clincal picture of PNA Restart as soon as possible        5.     Cough 5.  Yes    Current Assessment & Plan 03/26/2024 [facility]  Encounter Written 03/26/2024  9:14 PM by Jackee Zebedee BROCKS, MD  As above        8.     Fever 8.  Yes    Current Assessment & Plan 03/26/2024 Hospital Encounter Written 03/26/2024  9:14 PM by Jackee Zebedee BROCKS, MD  As  above        10.     COPD (chronic obstructive pulmonary disease) (HCC) 10.  Yes    Current Assessment & Plan 03/26/2024 Hospital Encounter Written 03/26/2024  9:14 PM by Jackee Zebedee BROCKS, MD  Cont home b-agonoists, LABA, LAMA etc            Advance Care Planning:   Code Status: Full Code   Consults: none  Family Communication: spoke with POA, Belvie Bilberry by phone and updated her on pt condition  Severity of Illness: The appropriate patient status for this patient is INPATIENT. Inpatient status is judged to be reasonable and necessary in order to provide the required intensity of service to ensure the patient's safety. The patient's presenting symptoms, physical exam findings, and initial radiographic and laboratory data in the context of their chronic comorbidities is felt to place them at high risk for further clinical deterioration. Furthermore, it is not anticipated that the patient will be medically stable for discharge from the hospital within 2 midnights of admission.   * I certify that at the point of admission it is my clinical judgment that the patient will require inpatient hospital care spanning beyond 2 midnights from the point of admission due to high intensity of service, high risk for further deterioration and high frequency of surveillance required.*  Author: Zebedee BROCKS Jackee, MD, FACP 03/26/2024 9:15 PM  For on call review [email or web address].

## 2024-03-26 NOTE — Assessment & Plan Note (Signed)
-  As above.

## 2024-03-26 NOTE — ED Notes (Signed)
 RN called lab to have Lipase added

## 2024-03-26 NOTE — Assessment & Plan Note (Signed)
 Azithro and ceftriaxone  per protocol - monitor cough add tessalon  if worsens - monitor fever curve

## 2024-03-26 NOTE — Assessment & Plan Note (Signed)
 Pt with active treatment for sepsis.  She has received fluids per protocol in the ED and looks much better. - admit to inpatient - IV ceftriaxone  and azithro for sepsis d/t pna - monitor lactic acid - doubt meningitis as initially thought in ED given rapid improvement - cont antipyretics prn

## 2024-03-26 NOTE — Assessment & Plan Note (Signed)
 Hold NOAC as pt may require LP.  At this point seems less likely given CT findings correlate with clincal picture of PNA Restart as soon as possible

## 2024-03-26 NOTE — ED Provider Notes (Signed)
 [place] EMERGENCY DEPARTMENT AT [facility] Provider Note   CSN: [id number] Arrival date & time: 03/26/24  1803     Patient presents with: Possible Menigitis   Tracy Dickerson is a 83 y.o. female.  She has a history of paroxysmal A-fib on anticoagulation, diabetes, hypertension, dementia.  Resident at the [facility].  Per EMS had headache and weakness nausea vomiting with cough since yesterday.  Today had fever and stiff neck.  She is chronically on oxygen.  Level 5 caveat secondary to altered mental status.  {Add pertinent medical, surgical, social history, OB history to YEP:67052} The history is provided by the patient and the EMS personnel.  Fever Temp source:  Unable to specify Progression:  Unchanged Chronicity:  New Associated symptoms: confusion, cough, headaches, nausea and vomiting   Associated symptoms: no chest pain   Risk factors: hx of cancer        Prior to Admission medications   Medication Sig Start Date End Date Taking? Authorizing Provider  acetaminophen  (TYLENOL ) 500 MG tablet Take 500 mg by mouth every 6 (six) hours as needed.    [provider]  albuterol  (PROAIR  HFA) 108 (90 Base) MCG/ACT inhaler Inhale 2 puffs into the lungs every 4 (four) hours as needed for wheezing or shortness of breath.     [provider]  apixaban  (ELIQUIS ) 5 MG TABS tablet Take 1 tablet (5 mg total) by mouth 2 (two) times daily. 06/20/23   Wouk, Devaughn Sayres, MD  atorvastatin  (LIPITOR) 20 MG tablet Take 20 mg by mouth daily. 05/01/20   [provider]  bisacodyl  (DULCOLAX) 10 MG suppository Place 1 suppository (10 mg total) rectally daily as needed for severe constipation. 09/03/23   Caleen Qualia, MD  cetirizine  (ZYRTEC ) 5 MG tablet Take 1 tablet (5 mg total) by mouth daily. 03/25/23 03/24/24  Cyrena Mylar, MD  cyanocobalamin  (VITAMIN B12) 1000 MCG tablet Take 1 tablet (1,000 mcg total) by mouth daily. 06/20/23   Wouk, Devaughn Sayres, MD   fluticasone -salmeterol (ADVAIR) 250-50 MCG/ACT AEPB Inhale 1 puff into the lungs in the morning and at bedtime.    [provider]  folic acid  (FOLVITE ) 1 MG tablet Take 1 tablet (1 mg total) by mouth daily. 09/04/23   Amin, Sumayya, MD  gabapentin  (NEURONTIN ) 300 MG capsule Take 2 capsules (600 mg total) by mouth at bedtime. 09/03/23   Caleen Qualia, MD  hydrocortisone  (CORTEF ) 5 MG tablet Take 1 tablet (5 mg total) by mouth daily. 09/03/23   Amin, Sumayya, MD  hydrOXYzine  (ATARAX ) 10 MG tablet Take 1 tablet (10 mg total) by mouth 3 (three) times daily as needed for itching. 09/03/23   Amin, Sumayya, MD  meclizine  (ANTIVERT ) 12.5 MG tablet Take 1 tablet (12.5 mg total) by mouth 3 (three) times daily as needed for dizziness. 07/03/23   Bradler, Evan K, MD  ondansetron  (ZOFRAN  ODT) 4 MG disintegrating tablet Take 1 tablet (4 mg total) by mouth every 8 (eight) hours as needed. 06/25/21   Menshew, Candida LULLA Kings, PA-C  pantoprazole  (PROTONIX ) 40 MG tablet Take 1 tablet (40 mg total) by mouth 2 (two) times daily. 06/20/23 06/19/24  Wouk, Devaughn Sayres, MD  QUEtiapine  (SEROQUEL ) 25 MG tablet Take 1 tablet (25 mg total) by mouth at bedtime. 12/30/23   Sreenath, Calvin NOVAK, MD  senna-docusate (SENOKOT-S) 8.6-50 MG tablet Take 2 tablets by mouth 2 (two) times daily. 12/30/23   Jhonny Calvin NOVAK, MD  SPIRIVA  HANDIHALER 18 MCG inhalation capsule Place 1 capsule (  18 mcg total) into inhaler and inhale daily. 02/09/22   Woods, Jaclyn M, PA-C  traMADol  (ULTRAM ) 50 MG tablet Take 1 tablet (50 mg total) by mouth 2 (two) times daily as needed. 09/03/23   Caleen Qualia, MD    Allergies: Oxycodone , Percocet [oxycodone -acetaminophen ], and Penicillins    Review of Systems  Unable to perform ROS: Mental status change  Constitutional:  Positive for fever.  Respiratory:  Positive for cough.   Cardiovascular:  Negative for chest pain.  Gastrointestinal:  Positive for nausea and vomiting.  Neurological:  Positive for  headaches.  Psychiatric/Behavioral:  Positive for confusion.     Updated Vital Signs Ht 5' 6 (1.676 m)   Wt 92 kg   SpO2 90%   BMI 32.74 kg/m   Physical Exam Vitals and nursing note reviewed.  Constitutional:      General: She is not in acute distress.    Appearance: Normal appearance. She is well-developed.  HENT:     Head: Normocephalic and atraumatic.  Eyes:     Conjunctiva/sclera: Conjunctivae normal.  Cardiovascular:     Rate and Rhythm: Tachycardia present. Rhythm irregular.     Heart sounds: No murmur heard. Pulmonary:     Effort: Pulmonary effort is normal. No respiratory distress.     Breath sounds: No stridor. Rhonchi (right base) present. No wheezing.  Abdominal:     Palpations: Abdomen is soft.     Tenderness: There is no abdominal tenderness. There is no guarding or rebound.  Musculoskeletal:        General: No tenderness or deformity. Normal range of motion.     Cervical back: No rigidity.  Skin:    General: Skin is warm and dry.  Neurological:     General: No focal deficit present.     Mental Status: She is alert. She is disoriented.     GCS: GCS eye subscore is 4. GCS verbal subscore is 5. GCS motor subscore is 6.     Motor: No weakness.     (all labs ordered are listed, but only abnormal results are displayed) Labs Reviewed  CULTURE, BLOOD (ROUTINE X 2)  CULTURE, BLOOD (ROUTINE X 2)  RESP PANEL BY RT-PCR (RSV, FLU A&B, COVID)  RVPGX2  URINE CULTURE  COMPREHENSIVE METABOLIC PANEL WITH GFR  CBC WITH DIFFERENTIAL/PLATELET  PROTIME-INR  URINALYSIS, W/ REFLEX TO CULTURE (INFECTION SUSPECTED)  I-STAT CG4 LACTIC ACID, ED  I-STAT CG4 LACTIC ACID, ED    EKG: None  Radiology: No results found.  {Document cardiac monitor, telemetry assessment procedure when appropriate:32947} Procedures   Medications Ordered in the ED  acetaminophen  (TYLENOL ) suppository 650 mg (has no administration in time range)  lactated ringers  bolus 1,000 mL (has no  administration in time range)    Clinical Course as of 03/26/24 2003  Fri Mar 26, 2024  1828 Chest x-ray showing elevated right hemidiaphragm possible right base infiltrate.  Awaiting radiology reading. [MB]  1848 Have initiated antibiotics for possible sepsis.  Triage note commented upon concern for meningitis due to headache and neck stiffness with fever.  She is on oral anticoagulation so will hold on lumbar puncture as her source is not clear.  Continuing workup. [MB]  1857 I reached out to patient's legal guardian Vinie Daring at [phone] and updated him. [MB]    Clinical Course User Index [MB] Towana Ozell BROCKS, MD   {Click here for ABCD2, HEART and other calculators REFRESH Note before signing:1}  Medical Decision Making Amount and/or Complexity of Data Reviewed Labs: ordered. Radiology: ordered.  Risk OTC drugs. Prescription drug management.   This patient complains of ***; this involves an extensive number of treatment Options and is a complaint that carries with it a high risk of complications and morbidity. The differential includes ***  I ordered, reviewed and interpreted labs, which included *** I ordered medication *** and reviewed PMP when indicated. I ordered imaging studies which included *** and I independently    visualized and interpreted imaging which showed *** Additional history obtained from *** Previous records obtained and reviewed *** I consulted *** and discussed lab and imaging findings and discussed disposition.  Cardiac monitoring reviewed, *** Social determinants considered, *** Critical Interventions: ***  After the interventions stated above, I reevaluated the patient and found *** Admission and further testing considered, ***   {Document critical care time when appropriate  Document review of labs and clinical decision tools ie CHADS2VASC2, etc  Document your independent review of radiology images and any  outside records  Document your discussion with family members, caretakers and with consultants  Document social determinants of health affecting pt's care  Document your decision making why or why not admission, treatments were needed:32947:::1}   Final diagnoses:  None    ED Discharge Orders     None

## 2024-03-26 NOTE — Progress Notes (Signed)
 Pt being followed by [facility] for Sepsis protocol.

## 2024-03-26 NOTE — ED Triage Notes (Signed)
 Patient to ED by EMS from [facility]. Per EMS patient is normally A&Ox4 Tracy Dickerson and yesterday she began to c/o headache, weakness and N/V. Today she developed a fever and has stiffness to neck. 100.6-T 90- O2 110- HR 180- CBG

## 2024-03-26 NOTE — Assessment & Plan Note (Signed)
 Cont home b-agonoists, LABA, LAMA etc

## 2024-03-27 DIAGNOSIS — J189 Pneumonia, unspecified organism: Secondary | ICD-10-CM

## 2024-03-27 LAB — BLOOD CULTURE ID PANEL (REFLEXED) - BCID2

## 2024-03-27 LAB — GLUCOSE, CAPILLARY
Glucose-Capillary: 151 mg/dL — ABNORMAL HIGH (ref 70–99)
Glucose-Capillary: 151 mg/dL — ABNORMAL HIGH (ref 70–99)
Glucose-Capillary: 129 mg/dL — ABNORMAL HIGH (ref 70–99)

## 2024-03-27 LAB — CBC
HCT: 35 % — ABNORMAL LOW (ref 36.0–46.0)
Hemoglobin: 11.1 g/dL — ABNORMAL LOW (ref 12.0–15.0)
MCH: 29.3 pg (ref 26.0–34.0)
MCHC: 31.7 g/dL (ref 30.0–36.0)
MCV: 92.3 fL (ref 80.0–100.0)
Platelets: 141 K/uL — ABNORMAL LOW (ref 150–400)
RBC: 3.79 MIL/uL — ABNORMAL LOW (ref 3.87–5.11)
RDW: 15.5 % (ref 11.5–15.5)
WBC: 14.4 K/uL — ABNORMAL HIGH (ref 4.0–10.5)
nRBC: 0 % (ref 0.0–0.2)

## 2024-03-27 LAB — PROTIME-INR
INR: 1.7 — ABNORMAL HIGH (ref 0.8–1.2)
Prothrombin Time: 21.3 s — ABNORMAL HIGH (ref 11.4–15.2)

## 2024-03-27 LAB — COMPREHENSIVE METABOLIC PANEL WITH GFR
ALT: 10 U/L (ref 0–44)
AST: 14 U/L — ABNORMAL LOW (ref 15–41)
Albumin: 2.5 g/dL — ABNORMAL LOW (ref 3.5–5.0)
Alkaline Phosphatase: 81 U/L (ref 38–126)
Anion gap: 8 (ref 5–15)
BUN: 19 mg/dL (ref 8–23)
CO2: 26 mmol/L (ref 22–32)
Calcium: 8.8 mg/dL — ABNORMAL LOW (ref 8.9–10.3)
Chloride: 109 mmol/L (ref 98–111)
Creatinine, Ser: 0.68 mg/dL (ref 0.44–1.00)
GFR, Estimated: >60 mL/min (ref >60)
Glucose, Bld: 159 mg/dL — ABNORMAL HIGH (ref 70–99)
Potassium: 3.8 mmol/L (ref 3.5–5.1)
Sodium: 143 mmol/L (ref 135–145)
Total Bilirubin: 0.5 mg/dL (ref 0.0–1.2)
Total Protein: 6.1 g/dL — ABNORMAL LOW (ref 6.5–8.1)

## 2024-03-27 LAB — HEMOGLOBIN A1C
Hgb A1c MFr Bld: 5.1 % (ref 4.8–5.6)
Mean Plasma Glucose: 99.67 mg/dL

## 2024-03-27 LAB — CORTISOL-AM, BLOOD: Cortisol - AM: 53.6 ug/dL — ABNORMAL HIGH (ref 6.7–22.6)

## 2024-03-27 MED ORDER — ALBUTEROL SULFATE (2.5 MG/3ML) 0.083% IN NEBU: 2.5000 mg | INHALATION_SOLUTION | RESPIRATORY_TRACT | 12 refills | Status: AC | PRN

## 2024-03-27 MED ORDER — CEFDINIR 300 MG PO CAPS: 300.0000 mg | ORAL_CAPSULE | Freq: Two times a day (BID) | ORAL | 0 refills | Status: AC

## 2024-03-27 MED ORDER — UMECLIDINIUM BROMIDE 62.5 MCG/ACT IN AEPB
1.0000 | INHALATION_SPRAY | Freq: Every day | RESPIRATORY_TRACT | Status: DC
  Filled 2024-03-27: qty 7

## 2024-03-27 MED ORDER — MELATONIN 5 MG PO TABS
5.0000 mg | ORAL_TABLET | Freq: Once | ORAL | Status: AC
  Administered 2024-03-27: 5 mg via ORAL

## 2024-03-27 MED ORDER — CEFADROXIL 500 MG PO CAPS: 500.0000 mg | ORAL_CAPSULE | Freq: Two times a day (BID) | ORAL | Status: DC

## 2024-03-27 MED ORDER — AMOXICILLIN-POT CLAVULANATE 875-125 MG PO TABS: 1.0000 | ORAL_TABLET | Freq: Two times a day (BID) | ORAL | Status: DC

## 2024-03-27 MED ORDER — CEFUROXIME AXETIL 500 MG PO TABS
500.0000 mg | ORAL_TABLET | Freq: Two times a day (BID) | ORAL | Status: DC
  Administered 2024-03-27 – 2024-03-28 (×2): 500 mg via ORAL
  Filled 2024-03-27 (×3): qty 1

## 2024-03-27 NOTE — Plan of Care (Signed)
  Problem: Education: Goal: Ability to describe self-care measures that may prevent or decrease complications (Diabetes Survival Skills Education) will improve 03/27/2024 1117 by Alaina Dozier PARAS, RN Outcome: Adequate for Discharge 03/27/2024 0751 by Alaina Dozier PARAS, RN Outcome: Progressing Goal: Individualized Educational Video(s) 03/27/2024 1117 by Alaina Dozier PARAS, RN Outcome: Adequate for Discharge 03/27/2024 0751 by Alaina Dozier PARAS, RN Outcome: Progressing   Problem: Coping: Goal: Ability to adjust to condition or change in health will improve 03/27/2024 1117 by Alaina Dozier PARAS, RN Outcome: Adequate for Discharge 03/27/2024 0751 by Alaina Dozier PARAS, RN Outcome: Progressing   Problem: Fluid Volume: Goal: Ability to maintain a balanced intake and output will improve 03/27/2024 1117 by Alaina Dozier PARAS, RN Outcome: Adequate for Discharge 03/27/2024 0751 by Alaina Dozier PARAS, RN Outcome: Progressing   Problem: Health Behavior/Discharge Planning: Goal: Ability to identify and utilize available resources and services will improve 03/27/2024 1117 by Alaina Dozier PARAS, RN Outcome: Adequate for Discharge 03/27/2024 0751 by Alaina Dozier PARAS, RN Outcome: Progressing Goal: Ability to manage health-related needs will improve Outcome: Adequate for Discharge   Problem: Metabolic: Goal: Ability to maintain appropriate glucose levels will improve Outcome: Adequate for Discharge   Problem: Nutritional: Goal: Maintenance of adequate nutrition will improve Outcome: Adequate for Discharge Goal: Progress toward achieving an optimal weight will improve Outcome: Adequate for Discharge   Problem: Skin Integrity: Goal: Risk for impaired skin integrity will decrease Outcome: Adequate for Discharge   Problem: Tissue Perfusion: Goal: Adequacy of tissue perfusion will improve Outcome: Adequate for Discharge   Problem: Fluid Volume: Goal: Hemodynamic  stability will improve Outcome: Adequate for Discharge   Problem: Clinical Measurements: Goal: Diagnostic test results will improve Outcome: Adequate for Discharge Goal: Signs and symptoms of infection will decrease Outcome: Adequate for Discharge   Problem: Respiratory: Goal: Ability to maintain adequate ventilation will improve Outcome: Adequate for Discharge   Problem: Education: Goal: Knowledge of General Education information will improve Description: Including pain rating scale, medication(s)/side effects and non-pharmacologic comfort measures Outcome: Adequate for Discharge   Problem: Health Behavior/Discharge Planning: Goal: Ability to manage health-related needs will improve Outcome: Adequate for Discharge   Problem: Clinical Measurements: Goal: Ability to maintain clinical measurements within normal limits will improve Outcome: Adequate for Discharge Goal: Will remain free from infection Outcome: Adequate for Discharge Goal: Diagnostic test results will improve Outcome: Adequate for Discharge Goal: Respiratory complications will improve Outcome: Adequate for Discharge Goal: Cardiovascular complication will be avoided Outcome: Adequate for Discharge   Problem: Activity: Goal: Risk for activity intolerance will decrease Outcome: Adequate for Discharge   Problem: Nutrition: Goal: Adequate nutrition will be maintained Outcome: Adequate for Discharge   Problem: Coping: Goal: Level of anxiety will decrease Outcome: Adequate for Discharge   Problem: Elimination: Goal: Will not experience complications related to bowel motility Outcome: Adequate for Discharge Goal: Will not experience complications related to urinary retention Outcome: Adequate for Discharge   Problem: Pain Managment: Goal: General experience of comfort will improve and/or be controlled Outcome: Adequate for Discharge   Problem: Safety: Goal: Ability to remain free from injury will  improve Outcome: Adequate for Discharge   Problem: Skin Integrity: Goal: Risk for impaired skin integrity will decrease Outcome: Adequate for Discharge

## 2024-03-27 NOTE — Evaluation (Signed)
 Physical Therapy Evaluation Patient Details Name: Tracy Dickerson MRN: [id number] DOB: 1941/04/06 Today's Date: 03/27/2024  History of Present Illness  Pt admitted from [facility] 2* AMS and dx with sepsis 2* PNA.   Pt with hx of dementia, heart disease, DM, LUng CA and CAD  Clinical Impression  Pt admitted as above and presenting with functional mobility limitations 2* mild ambulatory balance deficits and dementia related cognitive deficits.  This date, pt up to ambulate 400' in hall with RW and min Steady assist for safety only.  Pt very pleasant and cooperative.  Plan to dc to prior living arrangement at [facility] memory care.      If plan is discharge home, recommend the following: A little help with walking and/or transfers;A little help with bathing/dressing/bathroom;Assistance with cooking/housework;Assist for transportation;Help with stairs or ramp for entrance   Can travel by private vehicle        Equipment Recommendations None recommended by PT  Recommendations for Other Services       Functional Status Assessment       Precautions / Restrictions Precautions Precautions: Fall Recall of Precautions/Restrictions: Impaired Precaution/Restrictions Comments: dementia Restrictions Weight Bearing Restrictions Per Provider Order: No      Mobility  Bed Mobility Overal bed mobility: Needs Assistance Bed Mobility: Supine to Sit     Supine to sit: Supervision     General bed mobility comments: INcreased time with supervision for safety    Transfers Overall transfer level: Needs assistance Equipment used: Rolling walker (2 wheels) Transfers: Sit to/from Stand Sit to Stand: Contact guard assist           General transfer comment: Steady assist for safety    Ambulation/Gait Ambulation/Gait assistance: Contact guard assist Gait Distance (Feet): 400 Feet Assistive device: Rolling walker (2 wheels) Gait Pattern/deviations: Step-to pattern,  Step-through pattern, Shuffle, Trunk flexed       General Gait Details: Cues for posture and position from RW; Steady assist only for mild instability  Stairs            Wheelchair Mobility     Tilt Bed    Modified Rankin (Stroke Patients Only)       Balance Overall balance assessment: Mild deficits observed, not formally tested                                           Pertinent Vitals/Pain Pain Assessment Pain Assessment: No/denies pain    Home Living Family/patient expects to be discharged to:: Skilled nursing facility                   Additional Comments: Pt admitted from [facility] memory Care    Prior Function Prior Level of Function : Patient poor historian/Family not available                     Extremity/Trunk Assessment   Upper Extremity Assessment Upper Extremity Assessment: Overall WFL for tasks assessed    Lower Extremity Assessment Lower Extremity Assessment: Overall WFL for tasks assessed       Communication   Communication Communication: No apparent difficulties    Cognition Arousal: Alert Behavior During Therapy: WFL for tasks assessed/performed   PT - Cognitive impairments: History of cognitive impairments  PT - Cognition Comments: dementia Following commands: Impaired Following commands impaired: Only follows one step commands consistently     Cueing Cueing Techniques: Verbal cues, Gestural cues     General Comments      Exercises     Assessment/Plan    PT Assessment Patient needs continued PT services  PT Problem List Decreased cognition;Decreased balance;Decreased mobility       PT Treatment Interventions DME instruction;Gait training;Functional mobility training;Therapeutic activities;Therapeutic exercise;Balance training;Patient/family education    PT Goals (Current goals can be found in the Care Plan section)  Acute Rehab PT Goals Patient  Stated Goal: No specific goals expressed PT Goal Formulation: Patient unable to participate in goal setting Time For Goal Achievement: 04/10/24 Potential to Achieve Goals: Fair    Frequency Min 3X/week     Co-evaluation               AM-PAC PT 6 Clicks Mobility  Outcome Measure Help needed turning from your back to your side while in a flat bed without using bedrails?: None Help needed moving from lying on your back to sitting on the side of a flat bed without using bedrails?: None Help needed moving to and from a bed to a chair (including a wheelchair)?: A Little Help needed standing up from a chair using your arms (e.g., wheelchair or bedside chair)?: A Little Help needed to walk in hospital room?: A Little Help needed climbing 3-5 steps with a railing? : A Lot 6 Click Score: 19    End of Session Equipment Utilized During Treatment: Gait belt Activity Tolerance: Patient tolerated treatment well Patient left: in chair;with call bell/phone within reach;with chair alarm set Nurse Communication: Mobility status PT Visit Diagnosis: Unsteadiness on feet (R26.81)    Time: 8841-8779 PT Time Calculation (min) (ACUTE ONLY): 22 min   Charges:   PT Evaluation $PT Eval Low Complexity: 1 Low   PT General Charges $$ ACUTE PT VISIT: 1 Visit         [facility] PT Acute Rehabilitation Services Office [phone]   [facility] 03/27/2024, 12:49 PM

## 2024-03-27 NOTE — Hospital Course (Addendum)
 82yo with h/o dementia, breast/lung CA, CAD, and GERD who presented on 7/4 from ALF LTC with fever and cough.  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  She was diagnosed with sepsis due to PNA and started on Ceftriaxone  and Azithromycin .

## 2024-03-27 NOTE — [place] FL2 (Addendum)
 [place]  MEDICAID FL2 LEVEL OF CARE FORM     IDENTIFICATION  Patient Name: Tracy Dickerson Birthdate: [birth year] Sex: female Admission Date (Current Location): 03/26/2024  [facility] and [place] Number:  Producer, television/film/video and Address:  [facility],  [address], [place] [address]      Provider Number: [id number]  Attending Physician Name and Address:  Barbarann Nest, MD  Relative Name and Phone Number:  Belvie Bilberry (Legal Guardian)  [phone]    Current Level of Care: Hospital Recommended Level of Care: Assisted Living Facility, Memory Care Prior Approval Number:    Date Approved/Denied:   PASRR Number:    Discharge Plan: Domiciliary (Rest home)    Current Diagnoses: Patient Active Problem List   Diagnosis Date Noted   Cough 03/26/2024   Fever 03/26/2024   Atrial fibrillation, chronic (HCC) 03/26/2024   Sepsis due to pneumonia (HCC) 03/26/2024   Atypical pneumonia 11/21/2023   ILD (interstitial lung disease) (HCC) 11/21/2023   Dementia with behavioral disturbance (HCC) 11/21/2023   Low serum cortisol level 09/02/2023   Headache 08/30/2023   Vitamin D  deficiency 08/28/2023   Dizziness 08/25/2023   Iron  deficiency anemia 08/23/2023   Iron  deficiency anemia due to chronic blood loss 08/21/2023   Syncope 08/20/2023   Normocytic anemia 08/20/2023   Obesity (BMI 30-39.9) 08/20/2023   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/2023   Vitamin B12 deficiency 06/20/2023   Carotid stenosis 06/20/2023   Dementia (HCC) 06/19/2023   Generalized weakness 06/18/2023   Acute on chronic anemia 06/18/2023   H/O: upper GI bleed 06/18/2023   Chronic anticoagulation 06/18/2023   Infestation by bed bug 06/18/2023   CKD stage 3a, GFR 45-59 ml/min (HCC) 01/07/2023   Overweight (BMI 25.0-29.9) 01/06/2023   Type 2 diabetes mellitus with hyperlipidemia (HCC) 01/06/2023   COPD (chronic obstructive pulmonary disease) (HCC) 01/06/2023    Hypokalemia 01/06/2023   UGIB (upper gastrointestinal bleed) 01/05/2023   Unsatisfactory living conditions 01/05/2023   Constipation    UTI (urinary tract infection) 08/13/2020   HLD (hyperlipidemia) 08/13/2020   Acute renal failure superimposed on stage 3a chronic kidney disease (HCC) 08/13/2020   PAF (paroxysmal atrial fibrillation) (HCC) 08/13/2020   HTN (hypertension) 08/13/2020   Dehydration    Dehydration, moderate    Acute renal failure (HCC) 04/22/2020   Hyperkalemia    Septic shock (HCC)    Sepsis (HCC) 03/13/2018   Acute kidney injury superimposed on chronic kidney disease (HCC) 02/13/2018   Closed extra-articular fracture of distal tibia 02/02/2018   Community acquired bilateral lower lobe pneumonia 10/29/2017   Orthostatic hypotension 01/26/2017   Lung nodule 05/30/2014   Sleep apnea 05/10/2014   Barrett's esophagus 05/13/2007   Malignant neoplasm of breast (female) (HCC) 06/01/1997    Orientation RESPIRATION BLADDER Height & Weight     Self  Normal Incontinent Weight: 202 lb 13.2 oz (92 kg) Height:  5' 6 (167.6 cm)  BEHAVIORAL SYMPTOMS/MOOD NEUROLOGICAL BOWEL NUTRITION STATUS      Incontinent Diet (Regular)  AMBULATORY STATUS COMMUNICATION OF NEEDS Skin   Supervision Verbally Normal                       Personal Care Assistance Level of Assistance  Bathing, Feeding, Dressing Bathing Assistance: Limited assistance Feeding assistance: Independent Dressing Assistance: Limited assistance     Functional Limitations Info  Sight, Hearing, Speech Sight Info: Adequate Hearing Info: Adequate Speech Info: Impaired (slow to respond, forgetful, but speech  is clear)    SPECIAL CARE FACTORS FREQUENCY                       Contractures Contractures Info: Not present    Additional Factors Info  Code Status, Allergies Code Status Info: FULL Allergies Info: Oxycodone , Penicillins, Percocet (Oxycodone -acetaminophen )           Current Medications  (03/27/2024):  This is the current hospital active medication list Current Facility-Administered Medications  Medication Dose Route Frequency Provider Last Rate Last Admin   acetaminophen  (TYLENOL ) tablet 650 mg  650 mg Oral Q6H PRN Sheehan, Theresa C, MD   650 mg at 03/27/24 0919   Or   acetaminophen  (TYLENOL ) suppository 650 mg  650 mg Rectal Q6H PRN Jackee Zebedee BROCKS, MD       albuterol  (PROVENTIL ) (2.5 MG/3ML) 0.083% nebulizer solution 2.5 mg  2.5 mg Nebulization Q4H PRN Jackee Zebedee BROCKS, MD       atorvastatin  (LIPITOR) tablet 20 mg  20 mg Oral QHS Sheehan, Theresa C, MD   20 mg at 03/26/24 2212   azithromycin  (ZITHROMAX ) 500 mg in sodium chloride  0.9 % 250 mL IVPB  500 mg Intravenous Q24H Jackee Zebedee BROCKS, MD   Stopped at 03/26/24 2315   cefTRIAXone  (ROCEPHIN ) 2 g in sodium chloride  0.9 % 100 mL IVPB  2 g Intravenous Q24H Jackee Zebedee BROCKS, MD       famotidine  (PEPCID ) tablet 20 mg  20 mg Oral Daily Jackee Zebedee BROCKS, MD       gabapentin  (NEURONTIN ) capsule 600 mg  600 mg Oral QHS Sheehan, Theresa C, MD   600 mg at 03/26/24 2212   hydrocortisone  sodium succinate  (SOLU-CORTEF ) 100 MG injection 100 mg  100 mg Intravenous Q12H Jackee Zebedee BROCKS, MD   100 mg at 03/26/24 2211   insulin  aspart (novoLOG ) injection 0-15 Units  0-15 Units Subcutaneous TID WC Jackee Zebedee BROCKS, MD       lactated ringers  infusion   Intravenous Continuous Jackee Zebedee BROCKS, MD   Stopped at 03/27/24 9787   lactated ringers  infusion  150 mL/hr Intravenous Continuous Jackee Zebedee BROCKS, MD   Stopped at 03/27/24 0840   ondansetron  (ZOFRAN ) tablet 4 mg  4 mg Oral Q6H PRN Jackee Zebedee BROCKS, MD       Or   ondansetron  (ZOFRAN ) injection 4 mg  4 mg Intravenous Q6H PRN Jackee Zebedee BROCKS, MD       oxyCODONE  (Oxy IR/ROXICODONE ) immediate release tablet 10 mg  10 mg Oral Q3H PRN Jackee Zebedee BROCKS, MD   10 mg at 03/27/24 0920   pantoprazole  (PROTONIX ) EC tablet 40 mg  40 mg Oral BID Sheehan, Theresa C, MD   40 mg at  03/26/24 2212   polyethylene glycol (MIRALAX  / GLYCOLAX ) packet 17 g  17 g Oral Daily PRN Jackee Zebedee BROCKS, MD       QUEtiapine  (SEROQUEL ) tablet 25 mg  25 mg Oral QHS Sheehan, Theresa C, MD       senna-docusate (Senokot-S) tablet 2 tablet  2 tablet Oral BID Sheehan, Theresa C, MD   2 tablet at 03/26/24 2213   traZODone  (DESYREL ) tablet 25 mg  25 mg Oral QHS PRN Sheehan, Theresa C, MD   25 mg at 03/27/24 0920     Discharge Medications: acetaminophen  500 MG tablet Commonly known as: TYLENOL  Take 500 mg by mouth in the morning and at bedtime. And q6h PRN for pain    albuterol  (2.5 MG/3ML) 0.083%  nebulizer solution Commonly known as: PROVENTIL  Take 3 mLs (2.5 mg total) by nebulization every 4 (four) hours as needed for wheezing or shortness of breath. Replaces: ProAir  HFA 108 (90 Base) MCG/ACT inhaler    apixaban  5 MG Tabs tablet Commonly known as: ELIQUIS  Take 1 tablet (5 mg total) by mouth 2 (two) times daily.    atorvastatin  20 MG tablet Commonly known as: LIPITOR Take 20 mg by mouth at bedtime.    bisacodyl  10 MG suppository Commonly known as: DULCOLAX Place 1 suppository (10 mg total) rectally daily as needed for severe constipation.    cefdinir  300 MG capsule Commonly known as: OMNICEF  Take 1 capsule (300 mg total) by mouth 2 (two) times daily for 6 days.    cetirizine  5 MG tablet Commonly known as: ZYRTEC  Take 1 tablet (5 mg total) by mouth daily.    cimetidine 200 MG tablet Commonly known as: TAGAMET Take 200 mg by mouth daily.    cyanocobalamin  1000 MCG tablet Commonly known as: VITAMIN B12 Take 1 tablet (1,000 mcg total) by mouth daily.    dicyclomine  10 MG capsule Commonly known as: BENTYL  Take 10 mg by mouth in the morning and at bedtime.    fluticasone -salmeterol 250-50 MCG/ACT Aepb Commonly known as: ADVAIR Inhale 1 puff into the lungs in the morning and at bedtime.    folic acid  1 MG tablet Commonly known as: FOLVITE  Take 1 tablet (1 mg total) by  mouth daily.    gabapentin  300 MG capsule Commonly known as: NEURONTIN  Take 2 capsules (600 mg total) by mouth at bedtime.    hydrocortisone  5 MG tablet Commonly known as: CORTEF  Take 1 tablet (5 mg total) by mouth daily.    LORazepam  0.5 MG tablet Commonly known as: ATIVAN  Take 0.5 mg by mouth daily as needed for anxiety.    ondansetron  4 MG disintegrating tablet Commonly known as: Zofran  ODT Take 1 tablet (4 mg total) by mouth every 8 (eight) hours as needed. What changed: reasons to take this    pantoprazole  40 MG tablet Commonly known as: Protonix  Take 1 tablet (40 mg total) by mouth 2 (two) times daily.    QUEtiapine  25 MG tablet Commonly known as: SEROQUEL  Take 1 tablet (25 mg total) by mouth at bedtime. What changed: when to take this    senna-docusate 8.6-50 MG tablet Commonly known as: Senokot-S Take 2 tablets by mouth 2 (two) times daily.    Spiriva  HandiHaler 18 MCG inhalation capsule Generic drug: tiotropium Place 1 capsule (18 mcg total) into inhaler and inhale daily.    traMADol  50 MG tablet Commonly known as: ULTRAM  Take 1 tablet (50 mg total) by mouth 2 (two) times daily as needed. What changed: reasons to take this    Relevant Imaging Results:  Relevant Lab Results:   Additional Information SSN: [id number]  Heather LABOR Alioune Hodgkin, LCSW

## 2024-03-27 NOTE — TOC Initial Note (Signed)
 Transition of Care [facility]) - Initial/Assessment Note    Patient Details  Name: Tracy Dickerson MRN: [id number] Date of Birth: 1940-11-17  [facility]) CM/SW Contact:    Heather DELENA Saltness, LCSW Phone Number: 03/27/2024, 11:18 AM  Clinical Narrative:                 Pt admitted to hospital from [facility]. Pt is alert and oriented x1 at baseline. Pt has legal guardian, Petrina Dover [phone]. CSW spoke with Crystal at [facility], who confirmed pt can return to facility once PT evaluation is completed and pt can ambulate independently. PT consulted, awaiting evaluation. TOC will continue to follow.    Expected Discharge Plan: Memory Care Barriers to Discharge: Continued Medical Work up   Patient Goals and CMS Choice Patient states their goals for this hospitalization and ongoing recovery are:: To return to [facility]       Expected Discharge Plan and Services In-house Referral: Clinical Social Work Discharge Planning Services: NA Post Acute Care Choice: Nursing Home Living arrangements for the past 2 months: Assisted Living Facility [place]) Expected Discharge Date: 03/27/24               DME Arranged: N/A DME Agency: NA       HH Arranged: NA HH Agency: NA        Prior Living Arrangements/Services Living arrangements for the past 2 months: Assisted Living Facility ([place]) Lives with:: Self, Facility Resident Patient language and need for interpreter reviewed:: Yes Do you feel safe going back to the place where you live?: Yes      Need for Family Participation in Patient Care: Yes (Comment) Care giver support system in place?: Yes (comment)   Criminal Activity/Legal Involvement Pertinent to Current Situation/Hospitalization: No - Comment as needed  Activities of Daily Living   ADL Screening (condition at time of admission) Independently performs ADLs?: No Does the patient have a NEW difficulty  with bathing/dressing/toileting/self-feeding that is expected to last >3 days?: Yes (Initiates electronic notice to provider for possible OT consult) Does the patient have a NEW difficulty with getting in/out of bed, walking, or climbing stairs that is expected to last >3 days?: Yes (Initiates electronic notice to provider for possible PT consult) Does the patient have a NEW difficulty with communication that is expected to last >3 days?: No Is the patient deaf or have difficulty hearing?: No Does the patient have difficulty seeing, even when wearing glasses/contacts?: No Does the patient have difficulty concentrating, remembering, or making decisions?: Yes  Permission Sought/Granted Permission sought to share information with : Guardian, Magazine features editor Permission granted to share information with : Yes, Verbal Permission Granted  Share Information with NAME: Petrina Dover  Permission granted to share info w AGENCY: [facility]  Permission granted to share info w Relationship: Legal Guardian  Permission granted to share info w Contact Information: [phone]  Emotional Assessment Appearance::  (UTA) Attitude/Demeanor/Rapport: Unable to Assess Affect (typically observed): Unable to Assess Orientation: : Oriented to Self Alcohol / Substance Use: Not Applicable Psych Involvement: No (comment)  Admission diagnosis:  Sepsis due to pneumonia (HCC) [J18.9, A41.9] Patient Active Problem List   Diagnosis Date Noted   Cough 03/26/2024   Fever 03/26/2024   Atrial fibrillation, chronic (HCC) 03/26/2024   Sepsis due to pneumonia (HCC) 03/26/2024   Atypical pneumonia 11/21/2023   ILD (interstitial lung disease) (HCC) 11/21/2023   Dementia with behavioral disturbance (HCC) 11/21/2023  Low serum cortisol level 09/02/2023   Headache 08/30/2023   Vitamin D  deficiency 08/28/2023   Dizziness 08/25/2023   Iron  deficiency anemia 08/23/2023   Iron  deficiency anemia due to  chronic blood loss 08/21/2023   Syncope 08/20/2023   Normocytic anemia 08/20/2023   Obesity (BMI 30-39.9) 08/20/2023   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/2023   Vitamin B12 deficiency 06/20/2023   Carotid stenosis 06/20/2023   Dementia (HCC) 06/19/2023   Generalized weakness 06/18/2023   Acute on chronic anemia 06/18/2023   H/O: upper GI bleed 06/18/2023   Chronic anticoagulation 06/18/2023   Infestation by bed bug 06/18/2023   CKD stage 3a, GFR 45-59 ml/min (HCC) 01/07/2023   Overweight (BMI 25.0-29.9) 01/06/2023   Type 2 diabetes mellitus with hyperlipidemia (HCC) 01/06/2023   COPD (chronic obstructive pulmonary disease) (HCC) 01/06/2023   Hypokalemia 01/06/2023   UGIB (upper gastrointestinal bleed) 01/05/2023   Unsatisfactory living conditions 01/05/2023   Constipation    UTI (urinary tract infection) 08/13/2020   HLD (hyperlipidemia) 08/13/2020   Acute renal failure superimposed on stage 3a chronic kidney disease (HCC) 08/13/2020   PAF (paroxysmal atrial fibrillation) (HCC) 08/13/2020   HTN (hypertension) 08/13/2020   Dehydration    Dehydration, moderate    Acute renal failure (HCC) 04/22/2020   Hyperkalemia    Septic shock (HCC)    Sepsis (HCC) 03/13/2018   Acute kidney injury superimposed on chronic kidney disease (HCC) 02/13/2018   Closed extra-articular fracture of distal tibia 02/02/2018   Community acquired bilateral lower lobe pneumonia 10/29/2017   Orthostatic hypotension 01/26/2017   Lung nodule 05/30/2014   Sleep apnea 05/10/2014   Barrett's esophagus 05/13/2007   Malignant neoplasm of breast (female) (HCC) 06/01/1997   PCP:  Glendia Nelwyn NOVAK, PA Pharmacy:   [facility] - [place], [place] - [address] [place] [address] Phone: [phone] Fax: [phone]  [facility] - [place], [place] - [address] [place] [address] Phone: [phone] Fax:  [phone]  [id number] [place], [place] - [address] AT [facility] OF [address] [place] [address] Phone: [phone] Fax: [phone]  [id number] - [place], [place] - [address] AT [facility] OF [address] [facility] [address] [place] [address] Phone: [phone] Fax: [phone]  [facility] #[id number] [place], [place] - [address] AT [facility] OF [address] [place] [address] Phone: [phone] Fax: [phone]     Social Drivers of Health (SDOH) Social History: SDOH Screenings   Food Insecurity: No Food Insecurity (11/21/2023)  Housing: Low Risk  (11/21/2023)  Transportation Needs: No Transportation Needs (11/21/2023)  Utilities: Not At Risk (11/21/2023)  Financial Resource Strain: Low Risk  (02/04/2023)   [facility]  Physical Activity: Inactive (07/16/2021)   Received from [facility]  Social Connections: Patient Unable To Answer (11/21/2023)  Stress: No Stress Concern Present (07/16/2021)   [facility] from [facility]  Tobacco Use: Medium Risk (03/26/2024)  Health Literacy: Medium Risk (07/16/2021)   Received from [facility]   SDOH Interventions: Housing Interventions: Intervention Not Indicated, Inpatient TOC Physical Activity Interventions: Intervention Not Indicated, Inpatient TOC Health Literacy Interventions: Intervention Not Indicated, Inpatient TOC   Readmission Risk Interventions    03/27/2024   11:14 AM  Readmission Risk  Prevention Plan  Transportation Screening Complete  Medication Review Oceanographer) Complete  PCP or Specialist appointment within 3-5 days of discharge Complete  HRI or Home Care Consult Complete  SW Recovery Care/Counseling Consult Complete  Palliative Care Screening Not Applicable  Skilled Nursing Facility Not Applicable    Heather Saltness, MSW, LCSW 03/27/2024 11:20 AM

## 2024-03-27 NOTE — TOC Progression Note (Addendum)
 Transition of Care [facility]) - Progression Note    Patient Details  Name: Tracy Dickerson MRN: [id number] Date of Birth: 06/20/1941  [facility]) CM/SW Contact  Heather DELENA Saltness, LCSW Phone Number: 03/27/2024, 1:19 PM  Clinical Narrative:    ADDNEDUM  2:57 PM - CSW attempted to speak with Crystal, admissions RN, at [facility], via phone call. No answer, voicemail left.  1:30 PM - CSW attempted to speak with Crystal, admissions RN, at [facility], via phone call. No answer, voicemail left.  12:21 PM - CSW sent discharge summary and FL2 to [facility] via fax [phone]. CSW spoke with admissions RN, Crystal, who confirmed FL2 and d/c summary have been received. CSW then advised Crystal that PT has evaluated pt and that she can ambulate independently. Crystal reports she will review FL2 and d/c summary. CSW will await follow up phone call from [facility].   Expected Discharge Plan: Memory Care Barriers to Discharge: Continued Medical Work up  Expected Discharge Plan and Services In-house Referral: Clinical Social Work Discharge Planning Services: NA Post Acute Care Choice: Nursing Home Living arrangements for the past 2 months: Assisted Living Facility [place]) Expected Discharge Date: 03/27/24               DME Arranged: N/A DME Agency: NA       HH Arranged: NA HH Agency: NA         Social Determinants of Health (SDOH) Interventions SDOH Screenings   Food Insecurity: No Food Insecurity (11/21/2023)  Housing: Low Risk  (11/21/2023)  Transportation Needs: No Transportation Needs (11/21/2023)  Utilities: Not At Risk (11/21/2023)  Financial Resource Strain: Low Risk  (02/04/2023)   Received from [facility]  Physical Activity: Inactive (07/16/2021)   Received from [facility]  Social Connections: Patient Unable To Answer (11/21/2023)  Stress: No Stress Concern Present (07/16/2021)   [facility] from [facility]  Tobacco Use:  Medium Risk (03/26/2024)  Health Literacy: Medium Risk (07/16/2021)   [facility]    Readmission Risk Interventions    03/27/2024   11:14 AM  Readmission Risk Prevention Plan  Transportation Screening Complete  Medication Review (RN Care Manager) Complete  PCP or Specialist appointment within 3-5 days of discharge Complete  HRI or Home Care Consult Complete  SW Recovery Care/Counseling Consult Complete  Palliative Care Screening Not Applicable  Skilled Nursing Facility Not Applicable    Heather Saltness, MSW, LCSW 03/27/2024 1:22 PM

## 2024-03-27 NOTE — Plan of Care (Signed)

## 2024-03-27 NOTE — Progress Notes (Addendum)
 PHARMACY - PHYSICIAN COMMUNICATION CRITICAL VALUE ALERT - BLOOD CULTURE IDENTIFICATION (BCID)  Tracy Dickerson is an 83 y.o. female who presented to [facility] on 03/26/2024 with Dickerson chief complaint of CAP  Assessment:  1/3 BCx bottles growing unidentified staph spp (suspect contam given rapid improvement with treatment of PNA) - Hx tibial plate & screws from ankle fixation in 2019  Name of physician (or Provider) Contacted: Yates  Current antibiotics: Rocephin , Zithromax   Changes to prescribed antibiotics recommended:  Patient is on recommended antibiotics - No changes needed Per ID recommendations, likely contam, but would repeat BCx given existing hardware  Results for orders placed or performed during the hospital encounter of 03/26/24  Blood Culture ID Panel (Reflexed) (Collected: 03/26/2024  6:17 PM)  Result Value Ref Range   Enterococcus faecalis NOT DETECTED NOT DETECTED   Enterococcus Faecium NOT DETECTED NOT DETECTED   Listeria monocytogenes NOT DETECTED NOT DETECTED   Staphylococcus species DETECTED (Dickerson) NOT DETECTED   Staphylococcus aureus (BCID) NOT DETECTED NOT DETECTED   Staphylococcus epidermidis NOT DETECTED NOT DETECTED   Staphylococcus lugdunensis NOT DETECTED NOT DETECTED   Streptococcus species NOT DETECTED NOT DETECTED   Streptococcus agalactiae NOT DETECTED NOT DETECTED   Streptococcus pneumoniae NOT DETECTED NOT DETECTED   Streptococcus pyogenes NOT DETECTED NOT DETECTED   Dickerson.calcoaceticus-baumannii NOT DETECTED NOT DETECTED   Bacteroides fragilis NOT DETECTED NOT DETECTED   Enterobacterales NOT DETECTED NOT DETECTED   Enterobacter cloacae complex NOT DETECTED NOT DETECTED   Escherichia coli NOT DETECTED NOT DETECTED   Klebsiella aerogenes NOT DETECTED NOT DETECTED   Klebsiella oxytoca NOT DETECTED NOT DETECTED   Klebsiella pneumoniae NOT DETECTED NOT DETECTED   Proteus species NOT DETECTED NOT DETECTED   Salmonella species NOT DETECTED NOT DETECTED   Serratia  marcescens NOT DETECTED NOT DETECTED   Haemophilus influenzae NOT DETECTED NOT DETECTED   Neisseria meningitidis NOT DETECTED NOT DETECTED   Pseudomonas aeruginosa NOT DETECTED NOT DETECTED   Stenotrophomonas maltophilia NOT DETECTED NOT DETECTED   Candida albicans NOT DETECTED NOT DETECTED   Candida auris NOT DETECTED NOT DETECTED   Candida glabrata NOT DETECTED NOT DETECTED   Candida krusei NOT DETECTED NOT DETECTED   Candida parapsilosis NOT DETECTED NOT DETECTED   Candida tropicalis NOT DETECTED NOT DETECTED   Cryptococcus neoformans/gattii NOT DETECTED NOT DETECTED    Tracy Dickerson 03/27/2024  3:12 PM

## 2024-03-27 NOTE — Discharge Summary (Signed)
 Physician Discharge Summary   Patient: Tracy Dickerson MRN: [id number] DOB: 1941-01-24  Admit date:     03/26/2024  Discharge date: 03/27/24  Discharge Physician: Delon Herald   PCP: Glendia Nelwyn NOVAK, PA   Recommendations at discharge:   You are being discharged back to [facility] Complete antibiotics - Doxycycline  twice daily for 6 more days CT scan with mass-like opacity that is recommended for follow up CT in 2 months Follow up with PA Scott next week  Discharge Diagnoses: Principal Problem:   Sepsis due to pneumonia [facility]) Active Problems:   Community acquired bilateral lower lobe pneumonia   Sepsis (HCC)   PAF (paroxysmal atrial fibrillation) (HCC)   Cough   Fever   COPD (chronic obstructive pulmonary disease) [facility])    Hospital Course: 82yo with h/o dementia, breast/lung CA, CAD, and GERD who presented on 7/4 from ALF LTC with fever and cough.  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  She was diagnosed with sepsis due to PNA and started on Ceftriaxone  and Azithromycin .  Assessment and Plan:  Sepsis due to PNA Presented with fever to 101.9 with tachycardia and tachypnea O2 sats as low as 90% but this resolved and she is very comfortable-appearing on room air CXR with chronic fibrosis; CT with BLL patchy airspace opacities superimposed on fibrosis Admitted for CAP She was given antibiotics and has improved more quickly than anticipated Sepsis is ruled out Given rapid improvement, she is appropriate for discharge back to her facility and can complete antibiotics there Blood cultures are NTD  Possible lung mass CT with mass-like opacity in LLL Needs f/u CT in 2 months  Dementia Severe behavioral disturbance overnight Continue prn Ativan  and TID Seroquel  She is likely to do better in her usual environment Will dc to facility today, as above   PAF Resume Eliquis  Rate controlled without  medication  COPD/Pulmonary fibrosis Continue Albuterol  but change to nebs rather than HFA Continue Zyrtec , Advair, Spiriva  She is on chronic hydrocortisone  and was given 2 doses of stress-dosed steroids Resume hydrocortisone  on 7/6  HLD Continue atorvastatin          Consultants: None   Procedures: None   Antibiotics: Azithromycin  7/4-9 Ceftriaxone  7/4-9 Vancomycin  x 1  Pain control - [place]  Controlled Substance Reporting System database was reviewed. and patient was instructed, not to drive, operate heavy machinery, perform activities at heights, swimming or participation in water activities or provide baby-sitting services while on Pain, Sleep and Anxiety Medications; until their outpatient Physician has advised to do so again. Also recommended to not to take more than prescribed Pain, Sleep and Anxiety Medications.    Disposition: Home Diet recommendation:  Regular diet DISCHARGE MEDICATION: Allergies as of 03/27/2024       Reactions   Oxycodone     Penicillins Rash   Has patient had a PCN reaction causing immediate rash, facial/tongue/throat swelling, SOB or lightheadedness with hypotension: Yes Has patient had a PCN reaction causing severe rash involving mucus membranes or skin necrosis: Unknown Has patient had a PCN reaction that required hospitalization: Unknown Has patient had a PCN reaction occurring within the last 10 years: No If all of the above answers are NO, then may proceed with Cephalosporin use.   Percocet [oxycodone -acetaminophen ] Itching        Medication List     STOP taking these medications    ProAir  HFA 108 (90 Base) MCG/ACT inhaler Generic drug: albuterol  Replaced by: albuterol  (2.5  MG/3ML) 0.083% nebulizer solution       TAKE these medications    acetaminophen  500 MG tablet Commonly known as: TYLENOL  Take 500 mg by mouth in the morning and at bedtime. And q6h PRN for pain   albuterol  (2.5 MG/3ML) 0.083% nebulizer  solution Commonly known as: PROVENTIL  Take 3 mLs (2.5 mg total) by nebulization every 4 (four) hours as needed for wheezing or shortness of breath. Replaces: ProAir  HFA 108 (90 Base) MCG/ACT inhaler   apixaban  5 MG Tabs tablet Commonly known as: ELIQUIS  Take 1 tablet (5 mg total) by mouth 2 (two) times daily.   atorvastatin  20 MG tablet Commonly known as: LIPITOR Take 20 mg by mouth at bedtime.   bisacodyl  10 MG suppository Commonly known as: DULCOLAX Place 1 suppository (10 mg total) rectally daily as needed for severe constipation.   cefdinir  300 MG capsule Commonly known as: OMNICEF  Take 1 capsule (300 mg total) by mouth 2 (two) times daily for 6 days.   cetirizine  5 MG tablet Commonly known as: ZYRTEC  Take 1 tablet (5 mg total) by mouth daily.   cimetidine 200 MG tablet Commonly known as: TAGAMET Take 200 mg by mouth daily.   cyanocobalamin  1000 MCG tablet Commonly known as: VITAMIN B12 Take 1 tablet (1,000 mcg total) by mouth daily.   dicyclomine  10 MG capsule Commonly known as: BENTYL  Take 10 mg by mouth in the morning and at bedtime.   fluticasone -salmeterol 250-50 MCG/ACT Aepb Commonly known as: ADVAIR Inhale 1 puff into the lungs in the morning and at bedtime.   folic acid  1 MG tablet Commonly known as: FOLVITE  Take 1 tablet (1 mg total) by mouth daily.   gabapentin  300 MG capsule Commonly known as: NEURONTIN  Take 2 capsules (600 mg total) by mouth at bedtime.   hydrocortisone  5 MG tablet Commonly known as: CORTEF  Take 1 tablet (5 mg total) by mouth daily.   LORazepam  0.5 MG tablet Commonly known as: ATIVAN  Take 0.5 mg by mouth daily as needed for anxiety.   ondansetron  4 MG disintegrating tablet Commonly known as: Zofran  ODT Take 1 tablet (4 mg total) by mouth every 8 (eight) hours as needed. What changed: reasons to take this   pantoprazole  40 MG tablet Commonly known as: Protonix  Take 1 tablet (40 mg total) by mouth 2 (two) times daily.    QUEtiapine  25 MG tablet Commonly known as: SEROQUEL  Take 1 tablet (25 mg total) by mouth at bedtime. What changed: when to take this   senna-docusate 8.6-50 MG tablet Commonly known as: Senokot-S Take 2 tablets by mouth 2 (two) times daily.   Spiriva  HandiHaler 18 MCG inhalation capsule Generic drug: tiotropium Place 1 capsule (18 mcg total) into inhaler and inhale daily.   traMADol  50 MG tablet Commonly known as: ULTRAM  Take 1 tablet (50 mg total) by mouth 2 (two) times daily as needed. What changed: reasons to take this        Discharge Exam:   Subjective: She was screaming, hitting, and overall very inappropriate overnight and this AM.  I went to see her and she was complaining about both of her legs being broken.  After ongoing discussion, SCDs were removed and she was calm and agreeable to taking her medications.  Throughout, she was comfortable on RA without apparent cough or breathing difficulty.  She is obviously confused, reports living at her with her mother.   She presented from a facility.  Her guardian is not sure what she is like at baseline.  Guardian is in agreement with plan for dc back to her facility today.   Objective: Vitals:   03/27/24 0200 03/27/24 0646  BP: 104/64 110/74  Pulse: 76 78  Resp: 20 20  Temp: 97.7 F (36.5 C) 98 F (36.7 C)  SpO2: 97% 98%    Intake/Output Summary (Last 24 hours) at 03/27/2024 1011 Last data filed at 03/27/2024 0349 Gross per 24 hour  Intake 1170.4 ml  Output --  Net 1170.4 ml   Filed Weights   03/26/24 1810  Weight: 92 kg    Exam:  General:  Appeared agitated but comfortable and is in NAD; she was able to calm with soothing Eyes:   normal lids, iris ENT:  grossly normal hearing, lips & tongue, mmm Cardiovascular:  RRR. No LE edema.  Respiratory:   CTA bilaterally with no wheezes/rales/rhonchi.  Normal respiratory effort. Abdomen:  soft, NT, ND Skin:  no rash or induration seen on limited  exam Musculoskeletal:  grossly normal tone BUE/BLE, good ROM, no bony abnormality; SCDs removed, no apparent deformity of BLE and she was able to lift them off the bed without apparent difficulty Psychiatric:  agitated mood and affect, speech fluent but confused Neurologic:  CN 2-12 grossly intact, moves all extremities in coordinated fashion  Data Reviewed: I have reviewed the patient's lab results since admission.  Pertinent labs for today include:   Glucose 159 Albumin 2.5 Lactate 1.1, 0.7 WBC 14.4 Hgb 11.1 Platelets 141 Blood cultures NTD      Condition at discharge: improving  The results of significant diagnostics from this hospitalization (including imaging, microbiology, ancillary and laboratory) are listed below for reference.   Imaging Studies: CT Head Wo Contrast Result Date: 03/26/2024 CLINICAL DATA:  Mental status change, unknown cause EXAM: CT HEAD WITHOUT CONTRAST TECHNIQUE: Contiguous axial images were obtained from the base of the skull through the vertex without intravenous contrast. RADIATION DOSE REDUCTION: This exam was performed according to the departmental dose-optimization program which includes automated exposure control, adjustment of the mA and/or kV according to patient size and/or use of iterative reconstruction technique. COMPARISON:  CT head 09/20/2023 FINDINGS: Brain: Patchy and confluent areas of decreased attenuation are noted throughout the deep and periventricular white matter of the cerebral hemispheres bilaterally, compatible with chronic microvascular ischemic disease. No evidence of large-territorial acute infarction. No parenchymal hemorrhage. No mass lesion. No extra-axial collection. No mass effect or midline shift. No hydrocephalus. Basilar cisterns are patent. Vascular: No hyperdense vessel. Atherosclerotic calcifications are present within the cavernous internal carotid arteries. Skull: No acute fracture or focal lesion. Sinuses/Orbits: Paranasal  sinuses and mastoid air cells are clear. The orbits are unremarkable. Other: None. IMPRESSION: No acute intracranial abnormality. Electronically Signed   By: Morgane  Naveau M.D.   On: 03/26/2024 20:07   CT CHEST ABDOMEN PELVIS W CONTRAST Result Date: 03/26/2024 CLINICAL DATA:  Sepsis Per EMS patient is normally AANDOx4 yesterday she began to c/o headache, weakness and N/V. Today she developed a fever and has stiffness to neck. EXAM: CT CHEST, ABDOMEN, AND PELVIS WITH CONTRAST TECHNIQUE: Multidetector CT imaging of the chest, abdomen and pelvis was performed following the standard protocol during bolus administration of intravenous contrast. RADIATION DOSE REDUCTION: This exam was performed according to the departmental dose-optimization program which includes automated exposure control, adjustment of the mA and/or kV according to patient size and/or use of iterative reconstruction technique. CONTRAST:  80mL OMNIPAQUE  IOHEXOL  300 MG/ML  SOLN COMPARISON:  CT angio chest 11/21/2023, CT abdomen pelvis 11/01/2023 FINDINGS:  CT CHEST FINDINGS Cardiovascular: Normal heart size. No significant pericardial effusion. The thoracic aorta is normal in caliber. Mild atherosclerotic plaque of the thoracic aorta. No coronary artery calcifications. Aortic valve leaflet calcification. The main pulmonary artery is normal in caliber. No central or proximal segmental pulmonary embolus. Markedly limited evaluation more distally due to timing of contrast and motion artifact. Mediastinum/Nodes: Slight interval increase in size of mediastinal and right hilar lymphadenopathy with as an example a right paratracheal 1.7 cm (from 1.4 cm) lymph node. No enlarged left hilar or axillary lymph nodes. Thyroid  gland, trachea, and esophagus demonstrate no significant findings. Lungs/Pleura: Redemonstration of peripheral reticulations with superimposed patchy airspace opacities most prominent of the bilateral lower lobes. Associated mass like opacity  at the left base measuring 3.5 x 2.3 cm. No focal consolidation. No pulmonary nodule. No pulmonary mass. No pleural effusion. No pneumothorax. Musculoskeletal: No chest wall abnormality. No suspicious lytic or blastic osseous lesions. No acute displaced fracture. CT ABDOMEN PELVIS FINDINGS Hepatobiliary: No focal liver abnormality. No gallstones, gallbladder wall thickening, or pericholecystic fluid. No biliary dilatation. Pancreas: Diffusely atrophic. No focal lesion. Otherwise normal pancreatic contour. No surrounding inflammatory changes. No main pancreatic ductal dilatation. Spleen: Normal in size without focal abnormality. Adrenals/Urinary Tract: No adrenal nodule bilaterally. Bilateral kidneys enhance symmetrically. Multiple fluid density lesions of the kidneys likely represent simple renal cysts. Simple renal cysts, in the absence of clinically indicated signs/symptoms, require no independent follow-up. There is a 1.1 cm left renal lesion with a density of 50 Hounsfield units (4:61). No hydronephrosis. No hydroureter. The urinary bladder is unremarkable. Stomach/Bowel: Stomach is within normal limits. No evidence of bowel wall thickening or dilatation. Colonic diverticulosis. The appendix is not definitely identified with no inflammatory changes in the right lower quadrant to suggest acute appendicitis. 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 Severe atherosclerotic plaque of the aorta and its branches. No abdominal, pelvic, or inguinal lymphadenopathy. Reproductive: Status post hysterectomy. No adnexal masses. Other: No intraperitoneal free fluid. No intraperitoneal free gas. No organized fluid collection. Musculoskeletal: No abdominal wall hernia or abnormality. No suspicious lytic or blastic osseous lesions. Interval development of an L1 burst fracture with at least 80% vertebral body height loss and 6 mm retropulsion into the central canal.  IMPRESSION: 1. Bilateral lower lobe patchy airspace opacities superimposed on underlying pulmonary fibrosis.Associated mass like opacity at the left base measuring 3.5 x 2.3 cm. Recommend follow-up CT in 3 months to evaluate for complete resolution and exclude underlying mass. 2. Slight interval increase in right hilar and mediastinal lymphadenopathy. Finding may be reactive in etiology. Recommend attention on follow-up. 3. Interval development of an L1 burst fracture with at least 80% vertebral body height loss and 6 mm retropulsion into the central canal. Correlate with point tenderness to palpation to evaluate for an acute component. 4. Indeterminate 1.1 cm left renal. When the patient is clinically stable and able to follow directions and hold their breath (preferably as an outpatient) further evaluation with dedicated MRI renal protocol should be considered. 5. Colonic diverticulosis with no acute diverticulitis. 6. 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 Recommend follow-up ultrasound every 3 years. (Ref.: J Vasc Surg. 2018; 67:2-77 and J Am Coll Radiol 2013;10(10):789-794.) 7.  Aortic Atherosclerosis (ICD10-I70.0). Electronically Signed   By: Morgane  Naveau M.D.   On: 03/26/2024 20:05   DG Chest Port 1 View Result Date: 03/26/2024 CLINICAL DATA:  Headache weakness nausea vomiting  EXAM: PORTABLE CHEST 1 VIEW COMPARISON:  11/21/2023, CT chest 11/21/2023 FINDINGS: Diffuse bilateral reticular changes corresponding to chronic lung disease and fibrosis. No acute airspace disease, pleural effusion or pneumothorax. Stable cardiomediastinal silhouette with aortic atherosclerosis. Clips in the left axilla IMPRESSION: No active disease. Chronic lung disease and fibrosis. Electronically Signed   By: Luke Bun M.D.   On: 03/26/2024 18:29    Microbiology: Results for orders placed or performed during the hospital encounter of 03/26/24  Resp panel by RT-PCR (RSV,  Flu A&B, Covid) Anterior Nasal Swab     Status: None   Collection Time: 03/26/24  6:13 PM   Specimen: Anterior Nasal Swab  Result Value Ref Range Status   SARS Coronavirus 2 by RT PCR NEGATIVE NEGATIVE Final    Comment: (NOTE) SARS-CoV-2 target nucleic acids are NOT DETECTED.  The SARS-CoV-2 RNA is generally detectable in upper respiratory specimens during the acute phase of infection. The lowest concentration of SARS-CoV-2 viral copies this assay can detect is 138 copies/mL. A negative result does not preclude SARS-Cov-2 infection and should not be used as the sole basis for treatment or other patient management decisions. A negative result may occur with  improper specimen collection/handling, submission of specimen other than nasopharyngeal swab, presence of viral mutation(s) within the areas targeted by this assay, and inadequate number of viral copies(<138 copies/mL). A negative result must be combined with clinical observations, patient history, and epidemiological information. The expected result is Negative.  Fact Sheet for Patients:  BloggerCourse.com  Fact Sheet for Healthcare Providers:  SeriousBroker.it  This test is no t yet approved or cleared by the [place]  FDA and  has been authorized for detection and/or diagnosis of SARS-CoV-2 by FDA under an Emergency Use Authorization (EUA). This EUA will remain  in effect (meaning this test can be used) for the duration of the COVID-19 declaration under Section 564(b)(1) of the Act, 21 U.S.C.section 360bbb-3(b)(1), unless the authorization is terminated  or revoked sooner.       Influenza A by PCR NEGATIVE NEGATIVE Final   Influenza B by PCR NEGATIVE NEGATIVE Final    Comment: (NOTE) The Xpert Xpress SARS-CoV-2/FLU/RSV plus assay is intended as an aid in the diagnosis of influenza from Nasopharyngeal swab specimens and should not be used as a sole basis for  treatment. Nasal washings and aspirates are unacceptable for Xpert Xpress SARS-CoV-2/FLU/RSV testing.  Fact Sheet for Patients: BloggerCourse.com  Fact Sheet for Healthcare Providers: SeriousBroker.it  This test is not yet approved or cleared by the [place]  FDA and has been authorized for detection and/or diagnosis of SARS-CoV-2 by FDA under an Emergency Use Authorization (EUA). This EUA will remain in effect (meaning this test can be used) for the duration of the COVID-19 declaration under Section 564(b)(1) of the Act, 21 U.S.C. section 360bbb-3(b)(1), unless the authorization is terminated or revoked.     Resp Syncytial Virus by PCR NEGATIVE NEGATIVE Final    Comment: (NOTE) Fact Sheet for Patients: BloggerCourse.com  Fact Sheet for Healthcare Providers: SeriousBroker.it  This test is not yet approved or cleared by the [place]  FDA and has been authorized for detection and/or diagnosis of SARS-CoV-2 by FDA under an Emergency Use Authorization (EUA). This EUA will remain in effect (meaning this test can be used) for the duration of the COVID-19 declaration under Section 564(b)(1) of the Act, 21 U.S.C. section 360bbb-3(b)(1), unless the authorization is terminated or revoked.  Performed at [facility], [address] [place], [place]  [address]   Culture, blood (Routine x 2)     Status: None (Preliminary result)   Collection Time: 03/26/24  6:14 PM   Specimen: BLOOD RIGHT ARM  Result Value Ref Range Status   Specimen Description   Final    BLOOD RIGHT ARM Performed at [facility], [address], [place], [place] [address]    Special Requests   Final    BOTTLES DRAWN AEROBIC AND ANAEROBIC Blood Culture results may not be optimal due to an inadequate volume of blood received in culture bottles Performed at [facility], [address], [place], [place] [address]    Culture   Final    NO GROWTH < 12 HOURS Performed at [facility], [address] [place], [place] [address]    Report Status PENDING  Incomplete  Culture, blood (Routine x 2)     Status: None (Preliminary result)   Collection Time: 03/26/24  6:17 PM   Specimen: BLOOD  Result Value Ref Range Status   Specimen Description   Final    BLOOD LEFT ANTECUBITAL Performed at [facility], [address], [place], [place] [address]    Special Requests   Final    BOTTLES DRAWN AEROBIC ONLY Blood Culture results may not be optimal due to an inadequate volume of blood received in culture bottles Performed at [facility], [address], [place], [place] [address]    Culture   Final    NO GROWTH < 12 HOURS Performed at [facility], [address] [place], [place] [address]    Report Status PENDING  Incomplete    Labs: CBC: Recent Labs  Lab 03/26/24 1814 03/27/24 0623  WBC 14.0* 14.4*  NEUTROABS 12.6*  --   HGB 13.4 11.1*  HCT 41.2 35.0*  MCV 94.5 92.3  PLT 182 141*   Basic Metabolic Panel: Recent Labs  Lab 03/26/24 1814 03/27/24 0623  NA 141 143  K 3.5 3.8  CL 103 109  CO2 25 26  GLUCOSE 164* 159*  BUN 25* 19  CREATININE 1.38* 0.68  CALCIUM  9.7 8.8*   Liver Function Tests: Recent Labs  Lab 03/26/24 1814 03/27/24 0623  AST 17 14*  ALT 9 10  ALKPHOS 109 81  BILITOT 1.1 0.5  PROT 8.2* 6.1*  ALBUMIN 3.7 2.5*   CBG: Recent Labs  Lab 03/27/24 0203  GLUCAP 151*    Discharge time spent: greater than 30 minutes.  Signed: Delon Herald, MD [facility] 03/27/2024

## 2024-03-28 LAB — GLUCOSE, CAPILLARY
Glucose-Capillary: 124 mg/dL — ABNORMAL HIGH (ref 70–99)
Glucose-Capillary: 139 mg/dL — ABNORMAL HIGH (ref 70–99)

## 2024-03-28 MED ORDER — APIXABAN 5 MG PO TABS
5.0000 mg | ORAL_TABLET | Freq: Two times a day (BID) | ORAL | Status: DC
  Administered 2024-03-28: 5 mg via ORAL
  Filled 2024-03-28: qty 1

## 2024-03-28 NOTE — Progress Notes (Signed)
 No charge note:  Patient admitted with sepsis due to PNA.  She improved faster than anticipated and was appropriate for discharge on 7/5.  Discharge completed early in the day but her ALF requested PT evaluation, which was completed about noon.  ALF was unable to be contacted and this delayed transport back to the facility.  She remains unchanged and on oral antibiotics.  Plan is for dc to the facility this AM as per paperwork completed yesterday.   Delon CHARLENA Herald, M.D.

## 2024-03-28 NOTE — TOC Transition Note (Signed)
 Transition of Care [facility]) - Discharge Note   Patient Details  Name: Tracy Dickerson MRN: [id number] Date of Birth: [birth year]  [facility]) CM/SW Contact:  Sheri ONEIDA Sharps, LCSW Phone Number: 03/28/2024, 2:03 PM   Clinical Narrative:    Pt dc back to [facility] DC packet left at nurses station. PTAR called at 11:45am. No further TOC needs.    Final next level of care: Memory Care Barriers to Discharge: Barriers Resolved   Patient Goals and CMS Choice Patient states their goals for this hospitalization and ongoing recovery are:: return to [facility] CMS Medicare.gov Compare Post Acute Care list provided to::  (NA) Choice offered to / list presented to : NA [place] ownership interest in [facility].provided to::  (NA)    Discharge Placement                Patient to be transferred to facility by: PTAR      Discharge Plan and Services Additional resources added to the After Visit Summary for   In-house Referral: Clinical Social Work Discharge Planning Services: NA Post Acute Care Choice: Nursing Home          DME Arranged: N/A DME Agency: NA       HH Arranged: NA HH Agency: NA        Social Drivers of Health (SDOH) Interventions SDOH Screenings   Food Insecurity: No Food Insecurity (11/21/2023)  Housing: Low Risk  (11/21/2023)  Transportation Needs: No Transportation Needs (11/21/2023)  Utilities: Not At Risk (11/21/2023)  Financial Resource Strain: Low Risk  (02/04/2023)   Received from [facility]  Physical Activity: Inactive (07/16/2021)   Received from [facility]  Social Connections: Patient Unable To Answer (11/21/2023)  Stress: No Stress Concern Present (07/16/2021)   Received from [facility]  Tobacco Use: Medium Risk (03/26/2024)  Health Literacy: Medium Risk (07/16/2021)   [facility] from [facility]     Readmission Risk Interventions    03/27/2024   11:14 AM  Readmission Risk Prevention  Plan  Transportation Screening Complete  Medication Review (RN Care Manager) Complete  PCP or Specialist appointment within 3-5 days of discharge Complete  HRI or Home Care Consult Complete  SW Recovery Care/Counseling Consult Complete  Palliative Care Screening Not Applicable  Skilled Nursing Facility Not Applicable

## 2024-03-28 NOTE — Plan of Care (Incomplete)
 Pt D/C to SNF. A&OX1. AVS printed and put in D/C packet. Report called.  BP 125/66 (BP Location: Right Arm)   Pulse 68   Temp 97.6 F (36.4 C)   Resp 20   Ht 5' 6 (1.676 m)   Wt 92 kg   SpO2 100%   BMI 32.74 kg/m   Tracy Dickerson 03/28/24

## 2024-03-29 LAB — CULTURE, BLOOD (ROUTINE X 2)

## 2024-03-31 LAB — CULTURE, BLOOD (ROUTINE X 2): Culture: NO GROWTH

## 2024-04-01 LAB — CULTURE, BLOOD (ROUTINE X 2)
Culture: NO GROWTH
Culture: NO GROWTH

## 2024-04-05 ENCOUNTER — Other Ambulatory Visit: Payer: Self-pay | Admitting: Physician Assistant

## 2024-04-05 DIAGNOSIS — R918 Other nonspecific abnormal finding of lung field: Secondary | ICD-10-CM

## 2024-06-01 ENCOUNTER — Emergency Department (HOSPITAL_COMMUNITY)

## 2024-06-01 ENCOUNTER — Inpatient Hospital Stay (HOSPITAL_COMMUNITY)
Admission: EM | Admit: 2024-06-01 | Discharge: 2024-06-07 | DRG: 871 | Disposition: A | Discharge status: Hospice | Source: Skilled Nursing Facility | Attending: Internal Medicine | Admitting: Internal Medicine

## 2024-06-01 DIAGNOSIS — U071 COVID-19: Secondary | ICD-10-CM | POA: Diagnosis present

## 2024-06-01 DIAGNOSIS — Z66 Do not resuscitate: Secondary | ICD-10-CM | POA: Diagnosis present

## 2024-06-01 DIAGNOSIS — Y95 Nosocomial condition: Secondary | ICD-10-CM | POA: Diagnosis present

## 2024-06-01 DIAGNOSIS — R627 Adult failure to thrive: Secondary | ICD-10-CM | POA: Diagnosis present

## 2024-06-01 DIAGNOSIS — F02C0 Dementia in other diseases classified elsewhere, severe, without behavioral disturbance, psychotic disturbance, mood disturbance, and anxiety: Secondary | ICD-10-CM | POA: Diagnosis not present

## 2024-06-01 DIAGNOSIS — F03C0 Unspecified dementia, severe, without behavioral disturbance, psychotic disturbance, mood disturbance, and anxiety: Secondary | ICD-10-CM | POA: Diagnosis not present

## 2024-06-01 DIAGNOSIS — A4189 Other specified sepsis: Secondary | ICD-10-CM | POA: Diagnosis present

## 2024-06-01 DIAGNOSIS — R531 Weakness: Secondary | ICD-10-CM | POA: Diagnosis present

## 2024-06-01 DIAGNOSIS — Z7951 Long term (current) use of inhaled steroids: Secondary | ICD-10-CM | POA: Diagnosis not present

## 2024-06-01 DIAGNOSIS — E119 Type 2 diabetes mellitus without complications: Secondary | ICD-10-CM | POA: Diagnosis present

## 2024-06-01 DIAGNOSIS — G9340 Encephalopathy, unspecified: Secondary | ICD-10-CM | POA: Diagnosis present

## 2024-06-01 DIAGNOSIS — Z515 Encounter for palliative care: Secondary | ICD-10-CM

## 2024-06-01 DIAGNOSIS — K219 Gastro-esophageal reflux disease without esophagitis: Secondary | ICD-10-CM | POA: Diagnosis present

## 2024-06-01 DIAGNOSIS — J189 Pneumonia, unspecified organism: Secondary | ICD-10-CM | POA: Diagnosis present

## 2024-06-01 DIAGNOSIS — Z7189 Other specified counseling: Secondary | ICD-10-CM | POA: Diagnosis not present

## 2024-06-01 DIAGNOSIS — Z789 Other specified health status: Secondary | ICD-10-CM

## 2024-06-01 DIAGNOSIS — R4182 Altered mental status, unspecified: Principal | ICD-10-CM

## 2024-06-01 DIAGNOSIS — Z7901 Long term (current) use of anticoagulants: Secondary | ICD-10-CM

## 2024-06-01 DIAGNOSIS — Z79899 Other long term (current) drug therapy: Secondary | ICD-10-CM | POA: Diagnosis not present

## 2024-06-01 DIAGNOSIS — Z853 Personal history of malignant neoplasm of breast: Secondary | ICD-10-CM

## 2024-06-01 DIAGNOSIS — I2510 Atherosclerotic heart disease of native coronary artery without angina pectoris: Secondary | ICD-10-CM | POA: Diagnosis present

## 2024-06-01 DIAGNOSIS — M549 Dorsalgia, unspecified: Secondary | ICD-10-CM | POA: Diagnosis present

## 2024-06-01 DIAGNOSIS — J8410 Pulmonary fibrosis, unspecified: Secondary | ICD-10-CM | POA: Diagnosis present

## 2024-06-01 DIAGNOSIS — E785 Hyperlipidemia, unspecified: Secondary | ICD-10-CM | POA: Diagnosis present

## 2024-06-01 DIAGNOSIS — N1330 Unspecified hydronephrosis: Secondary | ICD-10-CM | POA: Diagnosis present

## 2024-06-01 DIAGNOSIS — J1282 Pneumonia due to coronavirus disease 2019: Secondary | ICD-10-CM | POA: Diagnosis present

## 2024-06-01 DIAGNOSIS — F02818 Dementia in other diseases classified elsewhere, unspecified severity, with other behavioral disturbance: Secondary | ICD-10-CM | POA: Diagnosis present

## 2024-06-01 DIAGNOSIS — J690 Pneumonitis due to inhalation of food and vomit: Secondary | ICD-10-CM | POA: Diagnosis present

## 2024-06-01 DIAGNOSIS — M4856XA Collapsed vertebra, not elsewhere classified, lumbar region, initial encounter for fracture: Secondary | ICD-10-CM | POA: Diagnosis present

## 2024-06-01 DIAGNOSIS — I480 Paroxysmal atrial fibrillation: Secondary | ICD-10-CM | POA: Diagnosis present

## 2024-06-01 DIAGNOSIS — I10 Essential (primary) hypertension: Secondary | ICD-10-CM | POA: Diagnosis present

## 2024-06-01 DIAGNOSIS — Z885 Allergy status to narcotic agent status: Secondary | ICD-10-CM

## 2024-06-01 DIAGNOSIS — Z87891 Personal history of nicotine dependence: Secondary | ICD-10-CM

## 2024-06-01 DIAGNOSIS — Z8701 Personal history of pneumonia (recurrent): Secondary | ICD-10-CM

## 2024-06-01 DIAGNOSIS — Z880 Allergy status to penicillin: Secondary | ICD-10-CM

## 2024-06-01 DIAGNOSIS — G309 Alzheimer's disease, unspecified: Secondary | ICD-10-CM | POA: Diagnosis not present

## 2024-06-01 DIAGNOSIS — J440 Chronic obstructive pulmonary disease with acute lower respiratory infection: Secondary | ICD-10-CM | POA: Diagnosis present

## 2024-06-01 DIAGNOSIS — Z90710 Acquired absence of both cervix and uterus: Secondary | ICD-10-CM

## 2024-06-01 LAB — CBC WITH DIFFERENTIAL/PLATELET
Abs Immature Granulocytes: 0.05 K/uL (ref 0.00–0.07)
Basophils Absolute: 0 K/uL (ref 0.0–0.1)
Basophils Relative: 0 %
Eosinophils Absolute: 0.1 K/uL (ref 0.0–0.5)
Eosinophils Relative: 1 %
HCT: 47.3 % — ABNORMAL HIGH (ref 36.0–46.0)
Hemoglobin: 15.1 g/dL — ABNORMAL HIGH (ref 12.0–15.0)
Immature Granulocytes: 0 %
Lymphocytes Relative: 8 %
Lymphs Abs: 1.1 K/uL (ref 0.7–4.0)
MCH: 31.5 pg (ref 26.0–34.0)
MCHC: 31.9 g/dL (ref 30.0–36.0)
MCV: 98.5 fL (ref 80.0–100.0)
Monocytes Absolute: 0.8 K/uL (ref 0.1–1.0)
Monocytes Relative: 6 %
Neutro Abs: 11.7 K/uL — ABNORMAL HIGH (ref 1.7–7.7)
Neutrophils Relative %: 85 %
Platelets: 173 K/uL (ref 150–400)
RBC: 4.8 MIL/uL (ref 3.87–5.11)
RDW: 14.8 % (ref 11.5–15.5)
WBC: 13.7 K/uL — ABNORMAL HIGH (ref 4.0–10.5)
nRBC: 0 % (ref 0.0–0.2)

## 2024-06-01 LAB — COMPREHENSIVE METABOLIC PANEL WITH GFR
ALT: 14 U/L (ref 0–44)
AST: 57 U/L — ABNORMAL HIGH (ref 15–41)
Albumin: 4 g/dL (ref 3.5–5.0)
Alkaline Phosphatase: 100 U/L (ref 38–126)
Anion gap: 13 (ref 5–15)
BUN: 20 mg/dL (ref 8–23)
CO2: 25 mmol/L (ref 22–32)
Calcium: 10.1 mg/dL (ref 8.9–10.3)
Chloride: 105 mmol/L (ref 98–111)
Creatinine, Ser: 0.96 mg/dL (ref 0.44–1.00)
GFR, Estimated: 59 mL/min — ABNORMAL LOW (ref >60)
Glucose, Bld: 98 mg/dL (ref 70–99)
Potassium: 4.9 mmol/L (ref 3.5–5.1)
Sodium: 143 mmol/L (ref 135–145)
Total Bilirubin: 0.8 mg/dL (ref 0.0–1.2)
Total Protein: 7.8 g/dL (ref 6.5–8.1)

## 2024-06-01 LAB — BLOOD GAS, VENOUS
Acid-Base Excess: 0.9 mmol/L (ref 0.0–2.0)
Bicarbonate: 27.8 mmol/L (ref 20.0–28.0)
O2 Saturation: 29.2 %
Patient temperature: 37
pCO2, Ven: 54 mmHg (ref 44–60)
pH, Ven: 7.32 (ref 7.25–7.43)
pO2, Ven: <31 mmHg — CL (ref 32–45)

## 2024-06-01 LAB — LACTIC ACID, PLASMA
Lactic Acid, Venous: 1.7 mmol/L (ref 0.5–1.9)
Lactic Acid, Venous: 1.7 mmol/L (ref 0.5–1.9)

## 2024-06-01 LAB — RESP PANEL BY RT-PCR (RSV, FLU A&B, COVID)  RVPGX2
Influenza A by PCR: NEGATIVE
Influenza B by PCR: NEGATIVE
Resp Syncytial Virus by PCR: NEGATIVE
SARS Coronavirus 2 by RT PCR: POSITIVE — AB

## 2024-06-01 LAB — LIPASE, BLOOD: Lipase: 15 U/L (ref 11–51)

## 2024-06-01 MED ORDER — QUETIAPINE FUMARATE 100 MG PO TABS
300.0000 mg | ORAL_TABLET | Freq: Every day | ORAL | Status: DC
Start: 2024-06-01 — End: 2024-06-07
  Administered 2024-06-01 – 2024-06-05 (×5): 300 mg via ORAL
  Filled 2024-06-01 (×6): qty 3

## 2024-06-01 MED ORDER — ONDANSETRON HCL 4 MG/2ML IJ SOLN: 4.0000 mg | Freq: Four times a day (QID) | INTRAMUSCULAR | Status: DC | PRN

## 2024-06-01 MED ORDER — GUAIFENESIN 100 MG/5ML PO LIQD: 5.0000 mL | ORAL | Status: DC | PRN

## 2024-06-01 MED ORDER — METRONIDAZOLE 500 MG/100ML IV SOLN
500.0000 mg | Freq: Two times a day (BID) | INTRAVENOUS | Status: DC
Start: 2024-06-01 — End: 2024-06-02
  Administered 2024-06-01 – 2024-06-02 (×2): 500 mg via INTRAVENOUS
  Filled 2024-06-01: qty 100

## 2024-06-01 MED ORDER — ACETAMINOPHEN 325 MG PO TABS
650.0000 mg | ORAL_TABLET | Freq: Four times a day (QID) | ORAL | Status: DC | PRN
  Administered 2024-06-05 (×2): 650 mg via ORAL
  Filled 2024-06-01 (×4): qty 2

## 2024-06-01 MED ORDER — LORAZEPAM 0.5 MG PO TABS
0.5000 mg | ORAL_TABLET | Freq: Two times a day (BID) | ORAL | Status: DC
Start: 2024-06-01 — End: 2024-06-07
  Administered 2024-06-01 – 2024-06-05 (×8): 0.5 mg via ORAL
  Filled 2024-06-01 (×11): qty 1

## 2024-06-01 MED ORDER — UMECLIDINIUM BROMIDE 62.5 MCG/ACT IN AEPB
1.0000 | INHALATION_SPRAY | Freq: Every day | RESPIRATORY_TRACT | Status: DC
  Administered 2024-06-03 – 2024-06-07 (×2): 1 via RESPIRATORY_TRACT
  Filled 2024-06-01: qty 7

## 2024-06-01 MED ORDER — VANCOMYCIN HCL IN DEXTROSE 1-5 GM/200ML-% IV SOLN
1000.0000 mg | Freq: Once | INTRAVENOUS | Status: AC
Start: 2024-06-01 — End: 2024-06-01
  Administered 2024-06-01: 1000 mg via INTRAVENOUS
  Filled 2024-06-01: qty 200

## 2024-06-01 MED ORDER — TRAZODONE HCL 50 MG PO TABS
25.0000 mg | ORAL_TABLET | Freq: Every evening | ORAL | Status: DC | PRN
  Administered 2024-06-01 – 2024-06-05 (×3): 25 mg via ORAL
  Filled 2024-06-01 (×3): qty 1

## 2024-06-01 MED ORDER — TRAMADOL HCL 50 MG PO TABS
50.0000 mg | ORAL_TABLET | Freq: Four times a day (QID) | ORAL | Status: DC | PRN
  Administered 2024-06-01 – 2024-06-05 (×3): 50 mg via ORAL
  Filled 2024-06-01 (×5): qty 1

## 2024-06-01 MED ORDER — ALBUTEROL SULFATE (2.5 MG/3ML) 0.083% IN NEBU: 2.5000 mg | INHALATION_SOLUTION | RESPIRATORY_TRACT | Status: DC | PRN

## 2024-06-01 MED ORDER — GABAPENTIN 300 MG PO CAPS
600.0000 mg | ORAL_CAPSULE | Freq: Every day | ORAL | Status: DC
Start: 2024-06-01 — End: 2024-06-07
  Administered 2024-06-01 – 2024-06-05 (×5): 600 mg via ORAL
  Filled 2024-06-01 (×6): qty 2

## 2024-06-01 MED ORDER — SODIUM CHLORIDE 0.9 % IV SOLN
2.0000 g | Freq: Once | INTRAVENOUS | Status: AC
  Administered 2024-06-01: 2 g via INTRAVENOUS
  Filled 2024-06-01: qty 12.5

## 2024-06-01 MED ORDER — SODIUM CHLORIDE 0.9 % IV BOLUS
500.0000 mL | Freq: Once | INTRAVENOUS | Status: AC
  Administered 2024-06-01: 500 mL via INTRAVENOUS

## 2024-06-01 MED ORDER — FLUTICASONE FUROATE-VILANTEROL 200-25 MCG/ACT IN AEPB
1.0000 | INHALATION_SPRAY | Freq: Every day | RESPIRATORY_TRACT | Status: DC
  Administered 2024-06-03 – 2024-06-07 (×2): 1 via RESPIRATORY_TRACT
  Filled 2024-06-01: qty 28

## 2024-06-01 MED ORDER — PANTOPRAZOLE SODIUM 40 MG PO TBEC
40.0000 mg | DELAYED_RELEASE_TABLET | Freq: Every day | ORAL | Status: DC
Start: 2024-06-02 — End: 2024-06-07
  Administered 2024-06-02 – 2024-06-03 (×2): 40 mg via ORAL
  Filled 2024-06-01 (×4): qty 1

## 2024-06-01 MED ORDER — ONDANSETRON HCL 4 MG PO TABS: 4.0000 mg | ORAL_TABLET | Freq: Four times a day (QID) | ORAL | Status: DC | PRN

## 2024-06-01 MED ORDER — LORATADINE 10 MG PO TABS
10.0000 mg | ORAL_TABLET | Freq: Every day | ORAL | Status: DC
  Administered 2024-06-02 – 2024-06-04 (×3): 10 mg via ORAL
  Filled 2024-06-01 (×3): qty 1

## 2024-06-01 MED ORDER — IOHEXOL 300 MG/ML  SOLN
100.0000 mL | Freq: Once | INTRAMUSCULAR | Status: AC | PRN
  Administered 2024-06-01: 100 mL via INTRAVENOUS

## 2024-06-01 MED ORDER — TIOTROPIUM BROMIDE MONOHYDRATE 18 MCG IN CAPS
1.0000 | ORAL_CAPSULE | Freq: Every day | RESPIRATORY_TRACT | Status: DC
Start: 2024-06-01 — End: 2024-06-01

## 2024-06-01 MED ORDER — SODIUM CHLORIDE 0.9 % IV SOLN
2.0000 g | Freq: Three times a day (TID) | INTRAVENOUS | Status: DC
  Administered 2024-06-02 (×2): 2 g via INTRAVENOUS
  Filled 2024-06-01 (×2): qty 12.5

## 2024-06-01 MED ORDER — SENNOSIDES-DOCUSATE SODIUM 8.6-50 MG PO TABS
2.0000 | ORAL_TABLET | Freq: Two times a day (BID) | ORAL | Status: DC
  Administered 2024-06-01 – 2024-06-05 (×7): 2 via ORAL
  Filled 2024-06-01 (×10): qty 2

## 2024-06-01 MED ORDER — ACETAMINOPHEN 650 MG RE SUPP: 650.0000 mg | Freq: Four times a day (QID) | RECTAL | Status: DC | PRN

## 2024-06-01 MED ORDER — ATORVASTATIN CALCIUM 10 MG PO TABS
20.0000 mg | ORAL_TABLET | Freq: Every day | ORAL | Status: DC
Start: 2024-06-01 — End: 2024-06-07
  Administered 2024-06-01 – 2024-06-05 (×5): 20 mg via ORAL
  Filled 2024-06-01 (×6): qty 2

## 2024-06-01 MED ORDER — APIXABAN 5 MG PO TABS
5.0000 mg | ORAL_TABLET | Freq: Two times a day (BID) | ORAL | Status: DC
Start: 2024-06-01 — End: 2024-06-07
  Administered 2024-06-01 – 2024-06-05 (×7): 5 mg via ORAL
  Filled 2024-06-01 (×10): qty 1

## 2024-06-01 MED ORDER — HYDROCORTISONE 5 MG PO TABS
5.0000 mg | ORAL_TABLET | Freq: Every day | ORAL | Status: DC
Start: 2024-06-02 — End: 2024-06-07
  Administered 2024-06-02 – 2024-06-04 (×3): 5 mg via ORAL
  Filled 2024-06-01 (×7): qty 1

## 2024-06-01 NOTE — ED Provider Notes (Signed)
 [place] EMERGENCY DEPARTMENT AT [facility] Provider Note   CSN: [id number] Arrival date & time: 06/01/24  [id number]     Patient presents with: Altered Mental Status   Tracy Dickerson is a 83 y.o. female.    Altered Mental Status Associated symptoms: no abdominal pain, no fever, no palpitations, no rash, no seizures and no vomiting     Patient presents with altered mental status.  Currently, patient endorsing some back pain.  Also endorses  some abdominal pain.  She is not aware of any kind of nausea or vomiting.  Last bowel movement was yesterday.  Denies any obvious fever or chills.  No current headache.  Per facility: usually indpendent with ambulation. Lethargic this morning. Not aat baseline. Slumped over on right side. Would only answer to ues or no. Was not trying to get up and walk. Complaining of back pain. Not at baseline. No vomiting. Had a couple cases of covid at facility. Usually up and moving around.     Previous medical history reviewed : Patient was last admitted in July 2025.  Admitted because of sepsis due to pneumonia.  Commune acquired bilateral lower lobe pneumonia.  Lung mass was also found.  Recommended repeat CT in 2 months.   Per [employer]: Had covid maybe two weeks ago.   Prior to Admission medications   Medication Sig Start Date End Date Taking? Authorizing Provider  acetaminophen  (TYLENOL ) 500 MG tablet Take 500 mg by mouth every 6 (six) hours as needed for mild pain (pain score 1-3) or moderate pain (pain score 4-6).   Yes [provider]  acetaminophen  (TYLENOL ) 500 MG tablet Take 500 mg by mouth in the morning and at bedtime.   Yes [provider]  albuterol  (PROVENTIL ) (2.5 MG/3ML) 0.083% nebulizer solution Take 3 mLs (2.5 mg total) by nebulization every 4 (four) hours as needed for wheezing or shortness of breath. 03/27/24  Yes Barbarann Nest, MD  apixaban  (ELIQUIS ) 5 MG TABS tablet Take 1 tablet (5 mg total) by  mouth 2 (two) times daily. 06/20/23  Yes Wouk, Devaughn Sayres, MD  atorvastatin  (LIPITOR) 20 MG tablet Take 20 mg by mouth at bedtime. 05/01/20  Yes [provider]  bisacodyl  (DULCOLAX) 10 MG suppository Place 1 suppository (10 mg total) rectally daily as needed for severe constipation. 09/03/23  Yes Caleen Qualia, MD  cetirizine  (ZYRTEC ) 5 MG tablet Take 1 tablet (5 mg total) by mouth daily. 03/25/23 06/01/24 Yes Cyrena Mylar, MD  cimetidine (TAGAMET) 200 MG tablet Take 200 mg by mouth daily. 02/23/24  Yes [provider]  cyanocobalamin  (VITAMIN B12) 1000 MCG tablet Take 1 tablet (1,000 mcg total) by mouth daily. 06/20/23  Yes Wouk, Devaughn Sayres, MD  fluticasone -salmeterol (ADVAIR) 250-50 MCG/ACT AEPB Inhale 1 puff into the lungs in the morning and at bedtime.   Yes [provider]  folic acid  (FOLVITE ) 1 MG tablet Take 1 tablet (1 mg total) by mouth daily. 09/04/23  Yes Caleen Qualia, MD  gabapentin  (NEURONTIN ) 300 MG capsule Take 2 capsules (600 mg total) by mouth at bedtime. 09/03/23  Yes Caleen Qualia, MD  guaiFENesin  (GERI-TUSSIN) 100 MG/5ML liquid Take 5 mLs by mouth every 4 (four) hours as needed for cough or to loosen phlegm.   Yes [provider]  hydrocortisone  (CORTEF ) 5 MG tablet Take 1 tablet (5 mg total) by mouth daily. 09/03/23  Yes Caleen Qualia, MD  LORazepam  (ATIVAN ) 0.5 MG tablet Take 0.5 mg by mouth every 12 (  twelve) hours. 01/27/24  Yes [provider]  ondansetron  (ZOFRAN  ODT) 4 MG disintegrating tablet Take 1 tablet (4 mg total) by mouth every 8 (eight) hours as needed. Patient taking differently: Take 4 mg by mouth every 8 (eight) hours as needed for nausea or vomiting. 06/25/21  Yes Menshew, Candida LULLA Kings, PA-C  pantoprazole  (PROTONIX ) 40 MG tablet Take 1 tablet (40 mg total) by mouth 2 (two) times daily. Patient taking differently: Take 40 mg by mouth daily. 06/20/23 06/19/24 Yes Wouk, Devaughn Sayres, MD  QUEtiapine  (SEROQUEL ) 300 MG tablet Take  300 mg by mouth daily.   Yes [provider]  senna-docusate (SENOKOT-S) 8.6-50 MG tablet Take 2 tablets by mouth 2 (two) times daily. 12/30/23  Yes Jhonny Calvin NOVAK, MD  SPIRIVA  HANDIHALER 18 MCG inhalation capsule Place 1 capsule (18 mcg total) into inhaler and inhale daily. 02/09/22  Yes Woods, Jaclyn M, PA-C  traMADol  (ULTRAM ) 50 MG tablet Take 1 tablet (50 mg total) by mouth 2 (two) times daily as needed. Patient taking differently: Take 50 mg by mouth 2 (two) times daily as needed for moderate pain (pain score 4-6). 09/03/23  Yes Caleen Qualia, MD    Allergies: Oxycodone , Penicillins, and Percocet [oxycodone -acetaminophen ]    Review of Systems  Constitutional:  Negative for chills and fever.  HENT:  Negative for ear pain and sore throat.   Eyes:  Negative for pain and visual disturbance.  Respiratory:  Negative for cough and shortness of breath.   Cardiovascular:  Negative for chest pain and palpitations.  Gastrointestinal:  Negative for abdominal pain and vomiting.  Genitourinary:  Negative for dysuria and hematuria.  Musculoskeletal:  Negative for arthralgias and back pain.  Skin:  Negative for color change and rash.  Neurological:  Negative for seizures and syncope.  All other systems reviewed and are negative.   Updated Vital Signs BP (!) 146/90   Pulse 90   Temp 97.7 F (36.5 C) (Oral)   Resp 14   SpO2 96%   Physical Exam Vitals and nursing note reviewed.  Constitutional:      General: She is not in acute distress.    Appearance: She is well-developed.  HENT:     Head: Normocephalic and atraumatic.  Eyes:     Conjunctiva/sclera: Conjunctivae normal.  Cardiovascular:     Rate and Rhythm: Normal rate and regular rhythm.     Heart sounds: No murmur heard. Pulmonary:     Effort: Pulmonary effort is normal. No respiratory distress.     Breath sounds: Normal breath sounds.  Abdominal:     Palpations: Abdomen is soft.     Tenderness: There is no abdominal  tenderness.  Musculoskeletal:        General: No swelling.     Cervical back: Neck supple.  Skin:    General: Skin is warm and dry.     Capillary Refill: Capillary refill takes less than 2 seconds.  Neurological:     Mental Status: She is alert.     Comments: Alert to self and place but not time. No focal deficits. Moving all extremities. No vision changes. CN II-XII intact.   Psychiatric:        Mood and Affect: Mood normal.     (all labs ordered are listed, but only abnormal results are displayed) Labs Reviewed  RESP PANEL BY RT-PCR (RSV, FLU A&B, COVID)  RVPGX2 - Abnormal; Notable for the following components:      Result Value   SARS Coronavirus  2 by RT PCR POSITIVE (*)    All other components within normal limits  CBC WITH DIFFERENTIAL/PLATELET - Abnormal; Notable for the following components:   WBC 13.7 (*)    Hemoglobin 15.1 (*)    HCT 47.3 (*)    Neutro Abs 11.7 (*)    All other components within normal limits  BLOOD GAS, VENOUS - Abnormal; Notable for the following components:   pO2, Ven <31 (*)    All other components within normal limits  COMPREHENSIVE METABOLIC PANEL WITH GFR - Abnormal; Notable for the following components:   AST 57 (*)    GFR, Estimated 59 (*)    All other components within normal limits  CULTURE, BLOOD (ROUTINE X 2)  CULTURE, BLOOD (ROUTINE X 2)  LACTIC ACID, PLASMA  LIPASE, BLOOD  URINALYSIS, ROUTINE W REFLEX MICROSCOPIC  LACTIC ACID, PLASMA    EKG: None  Radiology: CT CHEST ABDOMEN PELVIS W CONTRAST Result Date: 06/01/2024 CLINICAL DATA:  Altered mental status.  Generalized abdominal pain. EXAM: CT CHEST, ABDOMEN, AND PELVIS WITH CONTRAST TECHNIQUE: Multidetector CT imaging of the chest, abdomen and pelvis was performed following the standard protocol during bolus administration of intravenous contrast. RADIATION DOSE REDUCTION: This exam was performed according to the departmental dose-optimization program which includes automated  exposure control, adjustment of the mA and/or kV according to patient size and/or use of iterative reconstruction technique. CONTRAST:  OMNIPAQUE  IOHEXOL  300 MG/ML  SOLN COMPARISON:  CT dated 03/26/2024. FINDINGS: CT CHEST FINDINGS Cardiovascular: There is no cardiomegaly or pericardial effusion. Mild atherosclerotic calcification of the thoracic aorta. No aneurysmal dilatation or dissection. The origins of the great vessels of the aortic arch and the central pulmonary arteries appear patent. Mediastinum/Nodes: Mildly enlarged subcarinal lymph node measures 11 mm short axis. The esophagus is grossly unremarkable. No mediastinal fluid collection. Lungs/Pleura: There is diffuse interstitial coarsening and subpleural reticulation consistent with pulmonary fibrosis and interstitial lung disease. There is partial occlusion of the right lower lobe bronchus, likely related to mucous impaction or aspiration. An endobronchial lesion is not excluded. Patchy area of increased density at the right lung base may represent scarring/fibrosis or developing infiltrate. Interval resolution of the previously seen nodular density at the left lung base. There is no pleural effusion or pneumothorax. Musculoskeletal: Degenerative changes of the spine and osteopenia. There is fracture of the superior aspect of the T12 vertebra, new since the prior CT, likely acute. CT ABDOMEN PELVIS FINDINGS No intra-abdominal free air or free fluid. Hepatobiliary: The liver is unremarkable. No biliary dilatation. The gallbladder is unremarkable Pancreas: Unremarkable. No pancreatic ductal dilatation or surrounding inflammatory changes. Spleen: Normal in size without focal abnormality. Adrenals/Urinary Tract: The adrenal glands unremarkable. Bilateral renal cysts as seen previously. Follow-up left renal lesion as per recommendation of prior CT. There is mild bilateral hydronephrosis, right greater than left, and new since the prior CT. No obstructing  stone. The urinary bladder is mildly distended and grossly unremarkable. Stomach/Bowel: There is no bowel obstruction. There is mild distal colonic diverticulosis. No active inflammatory changes. Appendectomy. Vascular/Lymphatic: Moderate aortoiliac atherosclerotic disease. 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 The IVC is unremarkable. No portal venous gas. There is no adenopathy. Reproductive: Hysterectomy. Other: None Musculoskeletal: Relatively similar appearance of L1 compression fracture and greater than 50% loss of vertebral body height with fragmentation of the superior endplate. Approximately 3 mm retropulsion of the superior posterior cortex. There is fracture of the L5 vertebra, new since the prior CT, acute. There is  advanced osteopenia. IMPRESSION: 1. Acute fracture of the superior aspect of the T12 vertebra and L5 vertebra. 2. Relatively similar appearance of L1 compression fracture. 3. Partial occlusion of the right lower lobe bronchus, likely related to mucous impaction or aspiration. An endobronchial lesion is not excluded. 4. Patchy area of increased density at the right lung base may represent scarring/fibrosis or developing infiltrate. 5. Mild bilateral hydronephrosis, right greater than left, and new since the prior CT. No obstructing stone. 6.  Aortic Atherosclerosis (ICD10-I70.0). Electronically Signed   By: Vanetta Chou M.D.   On: 06/01/2024 15:40   CT Head Wo Contrast Result Date: 06/01/2024 CLINICAL DATA:  Altered mental status EXAM: CT HEAD WITHOUT CONTRAST TECHNIQUE: Contiguous axial images were obtained from the base of the skull through the vertex without intravenous contrast. RADIATION DOSE REDUCTION: This exam was performed according to the departmental dose-optimization program which includes automated exposure control, adjustment of the mA and/or kV according to patient size and/or use of iterative reconstruction technique. COMPARISON:  CT brain 03/26/2024  FINDINGS: Brain: No acute territorial infarction, hemorrhage or intracranial mass. Atrophy and mild chronic small vessel ischemic changes of the white matter. Stable ventricle size. Vascular: No hyperdense vessels.  Carotid vascular calcification Skull: Normal. Negative for fracture or focal lesion. Sinuses/Orbits: No acute finding. Other: None IMPRESSION: 1. No CT evidence for acute intracranial abnormality. 2. Atrophy and mild chronic small vessel ischemic changes of the white matter. Electronically Signed   By: Luke Bun M.D.   On: 06/01/2024 15:34   DG Chest Portable 1 View Result Date: 06/01/2024 CLINICAL DATA:  Altered level of consciousness EXAM: PORTABLE CHEST 1 VIEW COMPARISON:  03/26/2024 FINDINGS: Single frontal view of the chest demonstrates an unremarkable cardiac silhouette. Chronic areas of scarring and fibrosis are seen bilaterally within the lungs, right greater than left. No airspace disease, effusion, or pneumothorax. No acute bony abnormalities. IMPRESSION: 1. Chronic scarring and fibrosis.  No acute airspace disease. Electronically Signed   By: Ozell Daring M.D.   On: 06/01/2024 14:57     Procedures   Medications Ordered in the ED  atorvastatin  (LIPITOR) tablet 20 mg (has no administration in time range)  LORazepam  (ATIVAN ) tablet 0.5 mg (has no administration in time range)  QUEtiapine  (SEROQUEL ) tablet 300 mg (has no administration in time range)  hydrocortisone  (CORTEF ) tablet 5 mg (has no administration in time range)  pantoprazole  (PROTONIX ) EC tablet 40 mg (has no administration in time range)  senna-docusate (Senokot-S) tablet 2 tablet (has no administration in time range)  apixaban  (ELIQUIS ) tablet 5 mg (has no administration in time range)  gabapentin  (NEURONTIN ) capsule 600 mg (has no administration in time range)  tiotropium (SPIRIVA ) inhalation capsule ([facility] use ONLY) 18 mcg (has no administration in time range)  guaiFENesin  (ROBITUSSIN) 100 MG/5ML liquid 5 mL  (has no administration in time range)  fluticasone  furoate-vilanterol (BREO ELLIPTA ) 200-25 MCG/ACT 1 puff (has no administration in time range)  loratadine  (CLARITIN ) tablet 10 mg (has no administration in time range)  acetaminophen  (TYLENOL ) tablet 650 mg (has no administration in time range)    Or  acetaminophen  (TYLENOL ) suppository 650 mg (has no administration in time range)  traZODone  (DESYREL ) tablet 25 mg (has no administration in time range)  ondansetron  (ZOFRAN ) tablet 4 mg (has no administration in time range)    Or  ondansetron  (ZOFRAN ) injection 4 mg (has no administration in time range)  albuterol  (PROVENTIL ) (2.5 MG/3ML) 0.083% nebulizer solution 2.5 mg (has no administration in time range)  traMADol  (ULTRAM ) tablet 50 mg (has no administration in time range)  sodium chloride  0.9 % bolus 500 mL (500 mLs Intravenous New Bag/Given 06/01/24 1340)  ceFEPIme  (MAXIPIME ) 2 g in sodium chloride  0.9 % 100 mL IVPB (2 g Intravenous New Bag/Given 06/01/24 1337)  sodium chloride  0.9 % bolus 500 mL (500 mLs Intravenous New Bag/Given 06/01/24 1348)  vancomycin  (VANCOCIN ) IVPB 1000 mg/200 mL premix (1,000 mg Intravenous New Bag/Given 06/01/24 1411)  iohexol  (OMNIPAQUE ) 300 MG/ML solution 100 mL (100 mLs Intravenous Contrast Given 06/01/24 1440)                                    Medical Decision Making Amount and/or Complexity of Data Reviewed Labs: ordered. Radiology: ordered.  Risk Prescription drug management.      Patient presents with altered mental status.  Currently, patient endorsing some back pain.  Also endorses  some abdominal pain.  She is not aware of any kind of nausea or vomiting.  Last bowel movement was yesterday.  Denies any obvious fever or chills.  No current headache.  Per facility: usually indpendent with ambulation. Lethargic this morning. Not aat baseline. Slumped over on right side. Would only answer to ues or no. Was not trying to get up and walk. Complaining of  back pain. Not at baseline. No vomiting. Had a couple cases of covid at facility. Usually up and moving around.     Previous medical history reviewed : Patient was last admitted in July 2025.  Admitted because of sepsis due to pneumonia.  Commune acquired bilateral lower lobe pneumonia.  Lung mass was also found.  Recommended repeat CT in 2 months.  Initially presented altered.  Patient was able to tell me her name as well as location but not time.  Patient quickly falling asleep.  No focal deficits I can appreciate.  No concerns for CVA this point in time.  Obtaining CT dry.  No evidence of any kind of subdural epidural.   When speaking to facility.  Patient did test positive for COVID about 2 weeks ago.  Patient reportedly received Paxlovid.  Concern for postinfectious bacterial pneumonia.  Obtain chest x-ray.  Call with all pneumonia on the chest x-ray so did cover for hospital-acquired pneumonia with both cefepime  and vancomycin .  Lactic acid negative.  Blood cultures obtained.  CT chest shows possible postobstructive pneumonia.  I do think this could be driving patient's altered normal status.  Patient did have some nonspecific findings of her T and L-spine.  Possible fracture was seen.  Patient's not having any recent falls per her report as well as per facility.  Unclear whenever this happened.  Patient is neurologically intact.  No indication for further imaging at this point in time.'  Received a total of 1000 cc of fluid.  Remained hemodynamic stable.  Lactic acid negative.         Final diagnoses:  Altered mental status, unspecified altered mental status type  Pneumonia due to infectious organism, unspecified laterality, unspecified part of lung    ED Discharge Orders     None          Simon Lavonia SAILOR, MD 06/01/24 [phone]

## 2024-06-01 NOTE — H&P (Addendum)
 History and Physical  Tracy Dickerson FMW:[id number] DOB: 03-10-1941 DOA: 06/01/2024  PCP: Default, Provider, MD   Chief Complaint: Increased confusion  HPI: Tracy Dickerson is a 83 y.o. female with medical history significant for dementia, GERD, hypertension, atrial fibrillation on Eliquis  hospitalized earlier this summer with sepsis due to pneumonia presented to the ER from his facility with complaints of being more altered than usual and found to have evidence of recurrent pneumonia.  Patient is unable to provide any history due to her dementia, she initially complained of back pain on arrival this morning.  Currently she has no complaints and is unable to tell me the year or where she is.  On further discussion, she does consistently say that she feels a little short of breath and has had a cough.  She also reliably denies other symptoms throughout our conversation.  Workup in the emergency department as noted below shows evidence of acute pneumonia for which she was given empiric IV antibiotics and hospitalist admission was requested.  Review of Systems: Please see HPI for pertinent positives and negatives. A complete 10 system review of systems could not be performed due to the patient advanced dementia.  Past Medical History:  Diagnosis Date   Cancer (HCC)    Breast and lung   Diabetes mellitus without complication (HCC)    GERD (gastroesophageal reflux disease)    Heart disease    Hypertension    Past Surgical History:  Procedure Laterality Date   ABDOMINAL HYSTERECTOMY     APPENDECTOMY     BREAST SURGERY     breast cancer LEFT   COLONOSCOPY WITH PROPOFOL  N/A 08/23/2023   Procedure: COLONOSCOPY WITH PROPOFOL ;  Surgeon: Unk Corinn Skiff, MD;  Location: [facility] ENDOSCOPY;  Service: Gastroenterology;  Laterality: N/A;   ESOPHAGOGASTRODUODENOSCOPY N/A 08/23/2023   Procedure: ESOPHAGOGASTRODUODENOSCOPY (EGD);  Surgeon: Unk Corinn Skiff, MD;  Location: [facility] ENDOSCOPY;  Service:  Gastroenterology;  Laterality: N/A;   ESOPHAGOGASTRODUODENOSCOPY (EGD) WITH PROPOFOL  N/A 01/07/2023   Procedure: ESOPHAGOGASTRODUODENOSCOPY (EGD) WITH PROPOFOL ;  Surgeon: Jinny Carmine, MD;  Location: [facility] ENDOSCOPY;  Service: Endoscopy;  Laterality: N/A;   GIVENS CAPSULE STUDY N/A 08/23/2023   Procedure: GIVENS CAPSULE STUDY;  Surgeon: Unk Corinn Skiff, MD;  Location: [facility] ENDOSCOPY;  Service: Gastroenterology;  Laterality: N/A;   OPEN REDUCTION INTERNAL FIXATION (ORIF) TIBIA/FIBULA FRACTURE Left 02/03/2018   Procedure: OPEN REDUCTION INTERNAL FIXATION (ORIF) DISTAL TIBIA FRACTURE;  Surgeon: Edie Norleen PARAS, MD;  Location: [facility] ORS;  Service: Orthopedics;  Laterality: Left;  ankle/lower leg   TONSILLECTOMY     Social History:  reports that she has quit smoking. She has never used smokeless tobacco. She reports that she does not currently use alcohol. She reports that she does not use drugs.  Allergies  Allergen Reactions   Oxycodone  Itching   Penicillins Rash   Percocet [Oxycodone -Acetaminophen ] Itching    Family History  Problem Relation Age of Onset   Cancer Mother      Prior to Admission medications   Medication Sig Start Date End Date Taking? Authorizing Provider  acetaminophen  (TYLENOL ) 500 MG tablet Take 500 mg by mouth every 6 (six) hours as needed for mild pain (pain score 1-3) or moderate pain (pain score 4-6).   Yes [provider]  acetaminophen  (TYLENOL ) 500 MG tablet Take 500 mg by mouth in the morning and at bedtime.   Yes [provider]  albuterol  (PROVENTIL ) (2.5 MG/3ML) 0.083% nebulizer solution Take 3 mLs (2.5 mg total) by nebulization every 4 (  four) hours as needed for wheezing or shortness of breath. 03/27/24  Yes Barbarann Nest, MD  apixaban  (ELIQUIS ) 5 MG TABS tablet Take 1 tablet (5 mg total) by mouth 2 (two) times daily. 06/20/23  Yes Wouk, Devaughn Sayres, MD  atorvastatin  (LIPITOR) 20 MG tablet Take 20 mg by mouth at bedtime. 05/01/20  Yes [provider]  bisacodyl  (DULCOLAX) 10 MG suppository Place 1 suppository (10 mg total) rectally daily as needed for severe constipation. 09/03/23  Yes Caleen Qualia, MD  cetirizine  (ZYRTEC ) 5 MG tablet Take 1 tablet (5 mg total) by mouth daily. 03/25/23 06/01/24 Yes Cyrena Mylar, MD  cimetidine (TAGAMET) 200 MG tablet Take 200 mg by mouth daily. 02/23/24  Yes [provider]  cyanocobalamin  (VITAMIN B12) 1000 MCG tablet Take 1 tablet (1,000 mcg total) by mouth daily. 06/20/23  Yes Wouk, Devaughn Sayres, MD  fluticasone -salmeterol (ADVAIR) 250-50 MCG/ACT AEPB Inhale 1 puff into the lungs in the morning and at bedtime.   Yes [provider]  folic acid  (FOLVITE ) 1 MG tablet Take 1 tablet (1 mg total) by mouth daily. 09/04/23  Yes Caleen Qualia, MD  gabapentin  (NEURONTIN ) 300 MG capsule Take 2 capsules (600 mg total) by mouth at bedtime. 09/03/23  Yes Caleen Qualia, MD  guaiFENesin  (GERI-TUSSIN) 100 MG/5ML liquid Take 5 mLs by mouth every 4 (four) hours as needed for cough or to loosen phlegm.   Yes [provider]  hydrocortisone  (CORTEF ) 5 MG tablet Take 1 tablet (5 mg total) by mouth daily. 09/03/23  Yes Caleen Qualia, MD  LORazepam  (ATIVAN ) 0.5 MG tablet Take 0.5 mg by mouth every 12 (twelve) hours. 01/27/24  Yes [provider]  ondansetron  (ZOFRAN  ODT) 4 MG disintegrating tablet Take 1 tablet (4 mg total) by mouth every 8 (eight) hours as needed. Patient taking differently: Take 4 mg by mouth every 8 (eight) hours as needed for nausea or vomiting. 06/25/21  Yes Menshew, Candida LULLA Kings, PA-C  pantoprazole  (PROTONIX ) 40 MG tablet Take 1 tablet (40 mg total) by mouth 2 (two) times daily. Patient taking differently: Take 40 mg by mouth daily. 06/20/23 06/19/24 Yes Wouk, Devaughn Sayres, MD  QUEtiapine  (SEROQUEL ) 300 MG tablet Take 300 mg by mouth daily.   Yes [provider]  senna-docusate (SENOKOT-S) 8.6-50 MG tablet Take 2 tablets by mouth 2 (two) times daily. 12/30/23   Yes Sreenath, Sudheer B, MD  SPIRIVA  HANDIHALER 18 MCG inhalation capsule Place 1 capsule (18 mcg total) into inhaler and inhale daily. 02/09/22  Yes Woods, Jaclyn M, PA-C  traMADol  (ULTRAM ) 50 MG tablet Take 1 tablet (50 mg total) by mouth 2 (two) times daily as needed. Patient taking differently: Take 50 mg by mouth 2 (two) times daily as needed for moderate pain (pain score 4-6). 09/03/23  Yes Caleen Qualia, MD    Physical Exam: BP (!) 146/90   Pulse 90   Temp 97.7 F (36.5 C) (Oral)   Resp 14   SpO2 96%  General: Thin elderly female who is pleasant and cooperative, alert.  Oriented only to self. Cardiovascular: RRR, no murmurs or rubs, no peripheral edema  Respiratory: clear to auscultation bilaterally, no wheezes, no crackles  Abdomen: soft, nontender, nondistended, normal bowel tones heard  Skin: dry, no rashes  Musculoskeletal: no joint effusions, normal range of motion  Psychiatric: appropriate affect, normal speech  Neurologic: extraocular muscles intact, clear speech, moving all extremities with intact sensorium         Labs on Admission:  Basic  Metabolic Panel: Recent Labs  Lab 06/01/24 1300  NA 143  K 4.9  CL 105  CO2 25  GLUCOSE 98  BUN 20  CREATININE 0.96  CALCIUM  10.1   Liver Function Tests: Recent Labs  Lab 06/01/24 1300  AST 57*  ALT 14  ALKPHOS 100  BILITOT 0.8  PROT 7.8  ALBUMIN 4.0   Recent Labs  Lab 06/01/24 1300  LIPASE 15   No results for input(s): AMMONIA in the last 168 hours. CBC: Recent Labs  Lab 06/01/24 1155  WBC 13.7*  NEUTROABS 11.7*  HGB 15.1*  HCT 47.3*  MCV 98.5  PLT 173   Cardiac Enzymes: No results for input(s): CKTOTAL, CKMB, CKMBINDEX, TROPONINI in the last 168 hours. BNP (last 3 results) Recent Labs    08/20/23 1934  BNP 233.9*    ProBNP (last 3 results) No results for input(s): PROBNP in the last 8760 hours.  CBG: No results for input(s): GLUCAP in the last 168 hours.  Radiological  Exams on Admission: CT CHEST ABDOMEN PELVIS W CONTRAST Result Date: 06/01/2024 CLINICAL DATA:  Altered mental status.  Generalized abdominal pain. EXAM: CT CHEST, ABDOMEN, AND PELVIS WITH CONTRAST TECHNIQUE: Multidetector CT imaging of the chest, abdomen and pelvis was performed following the standard protocol during bolus administration of intravenous contrast. RADIATION DOSE REDUCTION: This exam was performed according to the departmental dose-optimization program which includes automated exposure control, adjustment of the mA and/or kV according to patient size and/or use of iterative reconstruction technique. CONTRAST:  OMNIPAQUE  IOHEXOL  300 MG/ML  SOLN COMPARISON:  CT dated 03/26/2024. FINDINGS: CT CHEST FINDINGS Cardiovascular: There is no cardiomegaly or pericardial effusion. Mild atherosclerotic calcification of the thoracic aorta. No aneurysmal dilatation or dissection. The origins of the great vessels of the aortic arch and the central pulmonary arteries appear patent. Mediastinum/Nodes: Mildly enlarged subcarinal lymph node measures 11 mm short axis. The esophagus is grossly unremarkable. No mediastinal fluid collection. Lungs/Pleura: There is diffuse interstitial coarsening and subpleural reticulation consistent with pulmonary fibrosis and interstitial lung disease. There is partial occlusion of the right lower lobe bronchus, likely related to mucous impaction or aspiration. An endobronchial lesion is not excluded. Patchy area of increased density at the right lung base may represent scarring/fibrosis or developing infiltrate. Interval resolution of the previously seen nodular density at the left lung base. There is no pleural effusion or pneumothorax. Musculoskeletal: Degenerative changes of the spine and osteopenia. There is fracture of the superior aspect of the T12 vertebra, new since the prior CT, likely acute. CT ABDOMEN PELVIS FINDINGS No intra-abdominal free air or free fluid.  Hepatobiliary: The liver is unremarkable. No biliary dilatation. The gallbladder is unremarkable Pancreas: Unremarkable. No pancreatic ductal dilatation or surrounding inflammatory changes. Spleen: Normal in size without focal abnormality. Adrenals/Urinary Tract: The adrenal glands unremarkable. Bilateral renal cysts as seen previously. Follow-up left renal lesion as per recommendation of prior CT. There is mild bilateral hydronephrosis, right greater than left, and new since the prior CT. No obstructing stone. The urinary bladder is mildly distended and grossly unremarkable. Stomach/Bowel: There is no bowel obstruction. There is mild distal colonic diverticulosis. No active inflammatory changes. Appendectomy. Vascular/Lymphatic: Moderate aortoiliac atherosclerotic disease. 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 The IVC is unremarkable. No portal venous gas. There is no adenopathy. Reproductive: Hysterectomy. Other: None Musculoskeletal: Relatively similar appearance of L1 compression fracture and greater than 50% loss of vertebral body height with fragmentation of the superior endplate. Approximately 3 mm retropulsion of  the superior posterior cortex. There is fracture of the L5 vertebra, new since the prior CT, acute. There is advanced osteopenia. IMPRESSION: 1. Acute fracture of the superior aspect of the T12 vertebra and L5 vertebra. 2. Relatively similar appearance of L1 compression fracture. 3. Partial occlusion of the right lower lobe bronchus, likely related to mucous impaction or aspiration. An endobronchial lesion is not excluded. 4. Patchy area of increased density at the right lung base may represent scarring/fibrosis or developing infiltrate. 5. Mild bilateral hydronephrosis, right greater than left, and new since the prior CT. No obstructing stone. 6.  Aortic Atherosclerosis (ICD10-I70.0). Electronically Signed   By: Vanetta Chou M.D.   On: 06/01/2024 15:40   CT Head Wo  Contrast Result Date: 06/01/2024 CLINICAL DATA:  Altered mental status EXAM: CT HEAD WITHOUT CONTRAST TECHNIQUE: Contiguous axial images were obtained from the base of the skull through the vertex without intravenous contrast. RADIATION DOSE REDUCTION: This exam was performed according to the departmental dose-optimization program which includes automated exposure control, adjustment of the mA and/or kV according to patient size and/or use of iterative reconstruction technique. COMPARISON:  CT brain 03/26/2024 FINDINGS: Brain: No acute territorial infarction, hemorrhage or intracranial mass. Atrophy and mild chronic small vessel ischemic changes of the white matter. Stable ventricle size. Vascular: No hyperdense vessels.  Carotid vascular calcification Skull: Normal. Negative for fracture or focal lesion. Sinuses/Orbits: No acute finding. Other: None IMPRESSION: 1. No CT evidence for acute intracranial abnormality. 2. Atrophy and mild chronic small vessel ischemic changes of the white matter. Electronically Signed   By: Luke Bun M.D.   On: 06/01/2024 15:34   DG Chest Portable 1 View Result Date: 06/01/2024 CLINICAL DATA:  Altered level of consciousness EXAM: PORTABLE CHEST 1 VIEW COMPARISON:  03/26/2024 FINDINGS: Single frontal view of the chest demonstrates an unremarkable cardiac silhouette. Chronic areas of scarring and fibrosis are seen bilaterally within the lungs, right greater than left. No airspace disease, effusion, or pneumothorax. No acute bony abnormalities. IMPRESSION: 1. Chronic scarring and fibrosis.  No acute airspace disease. Electronically Signed   By: Ozell Daring M.D.   On: 06/01/2024 14:57   Assessment/Plan Tracy Dickerson is a 83 y.o. female with medical history significant for dementia, GERD, hypertension, atrial fibrillation on Eliquis  hospitalized earlier this summer with sepsis due to pneumonia presented to the ER from his facility with complaints of being more altered than usual  and found to have evidence of recurrent pneumonia.   Sepsis due to aspiration pneumonia-with leukocytosis, cough, mild encephalopathy in the setting of dementia.  Normal lactate, meeting sepsis criteria with tachycardia and leukocytosis.  No evidence of endorgan dysfunction. -Inpatient admission -Empiric IV cefepime  and IV Flagyl  -SLP evaluation for suspected aspiration  Compression fracture-with acute findings at T12 and L5.  Patient initially complained of pain, no history of falls provided.  Staff attempted to contact facility multiple times, with no callback.  Currently patient is resting comfortably and denies pain. -Pain control with tramadol  as needed -PT evaluation  COVID infection-found to be positive in the ER today.  Not hypoxic, does not meet criteria for treatment. -Supportive care -Precautions  Dementia with behavioral disturbance-continue Seroquel  and gabapentin  at night  GERD-p.o. 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  Continue home hydrocortisone , will have a low threshold for stress dose steroids.  Hyperlipidemia-atorvastatin   DVT prophylaxis: Eliquis     Code Status: Full Code, per my review of  her nursing home records.  Consults called: None  Admission status: The appropriate patient status for this patient is INPATIENT. Inpatient status is judged to be reasonable and necessary in order to provide the required intensity of service to ensure the patient's safety. The patient's presenting symptoms, physical exam findings, and initial radiographic and laboratory data in the context of their chronic comorbidities is felt to place them at high risk for further clinical deterioration. Furthermore, it is not anticipated that the patient will be medically stable for discharge from the hospital within 2 midnights of admission.    I certify that at the  point of admission it is my clinical judgment that the patient will require inpatient hospital care spanning beyond 2 midnights from the point of admission due to high intensity of service, high risk for further deterioration and high frequency of surveillance required  Time spent: 55 minutes  Javohn Basey CHRISTELLA Gail MD [facility] Pager [phone]  If 7PM-7AM, please contact night-coverage [email or web address] Password [email or web address]  06/01/2024, 4:56 PM

## 2024-06-01 NOTE — Progress Notes (Signed)
 Pharmacy Note   A consult was received from an ED physician for vancomycin  per pharmacy dosing.    The patient's profile has been reviewed for ht/wt/allergies/indication/available labs.    A one time order has been placed for vancomycin  1000 mg IV x1 .    Further antibiotics/pharmacy consults should be ordered by admitting physician if indicated.                       Thank you,  Esabella Stockinger, PharmD, BCPS 06/01/2024 1:59 PM

## 2024-06-01 NOTE — ED Triage Notes (Signed)
 BIB EMS from [facility]. Dementia at baseline, but per facility staff patient is more altered than usual and is unable to hold a conversation. Repeatedly stating her back hurts.

## 2024-06-02 DIAGNOSIS — U071 COVID-19: Secondary | ICD-10-CM

## 2024-06-02 DIAGNOSIS — Z515 Encounter for palliative care: Secondary | ICD-10-CM

## 2024-06-02 DIAGNOSIS — F02C0 Dementia in other diseases classified elsewhere, severe, without behavioral disturbance, psychotic disturbance, mood disturbance, and anxiety: Secondary | ICD-10-CM

## 2024-06-02 DIAGNOSIS — G309 Alzheimer's disease, unspecified: Secondary | ICD-10-CM

## 2024-06-02 DIAGNOSIS — J189 Pneumonia, unspecified organism: Secondary | ICD-10-CM | POA: Diagnosis not present

## 2024-06-02 LAB — CBC
HCT: 36.9 % (ref 36.0–46.0)
Hemoglobin: 11.2 g/dL — ABNORMAL LOW (ref 12.0–15.0)
MCH: 29.6 pg (ref 26.0–34.0)
MCHC: 30.4 g/dL (ref 30.0–36.0)
MCV: 97.4 fL (ref 80.0–100.0)
Platelets: 161 K/uL (ref 150–400)
RBC: 3.79 MIL/uL — ABNORMAL LOW (ref 3.87–5.11)
RDW: 14.5 % (ref 11.5–15.5)
WBC: 13.8 K/uL — ABNORMAL HIGH (ref 4.0–10.5)
nRBC: 0 % (ref 0.0–0.2)

## 2024-06-02 LAB — BASIC METABOLIC PANEL WITH GFR
Anion gap: 13 (ref 5–15)
BUN: 19 mg/dL (ref 8–23)
CO2: 21 mmol/L — ABNORMAL LOW (ref 22–32)
Calcium: 9.2 mg/dL (ref 8.9–10.3)
Chloride: 108 mmol/L (ref 98–111)
Creatinine, Ser: 0.93 mg/dL (ref 0.44–1.00)
GFR, Estimated: >60 mL/min (ref >60)
Glucose, Bld: 103 mg/dL — ABNORMAL HIGH (ref 70–99)
Potassium: 4 mmol/L (ref 3.5–5.1)
Sodium: 142 mmol/L (ref 135–145)

## 2024-06-02 MED ORDER — ALBUTEROL SULFATE (2.5 MG/3ML) 0.083% IN NEBU: 2.5000 mg | INHALATION_SOLUTION | RESPIRATORY_TRACT | Status: DC | PRN

## 2024-06-02 MED ORDER — SODIUM CHLORIDE 0.9 % IV SOLN: INTRAVENOUS | Status: AC

## 2024-06-02 MED ORDER — SODIUM CHLORIDE 0.9 % IV SOLN
1.5000 g | Freq: Four times a day (QID) | INTRAVENOUS | Status: DC
  Administered 2024-06-02 – 2024-06-07 (×21): 1.5 g via INTRAVENOUS
  Filled 2024-06-02 (×22): qty 4

## 2024-06-02 MED ORDER — SODIUM CHLORIDE 0.9 % IV SOLN: 2.0000 g | Freq: Two times a day (BID) | INTRAVENOUS | Status: DC

## 2024-06-02 NOTE — Consult Note (Cosign Needed Addendum)
 Consultation Note Date: 06/02/2024   Patient Name: Tracy Dickerson  DOB: 20-Oct-1940  MRN: [id number]  Age / Sex: 83 y.o., female  PCP: Default, Provider, MD Referring Physician: [place], Eric J, DO  Reason for Consultation:  48F w/ advanced dementia admit with recurrent aspiration PNA, +COVID, . Prognosis poor. Assist with GOC/MDM advanced dementia, COPD, Asp PNA, COVID, and new lumbar compression fracture that will decrease her mobility significantly. Poor overall prognosis  HPI/Patient Profile: 83 y.o. female  with past medical history of dementia- living at [facility], COPD/pulmonary fibrosis admitted on 06/01/2024 with generalized weakness and altered mental status. Workup reveals +Covid and likely aspiration pnuemonia. She was hospitalized in July also for pneumonia. Palliative medicine consulted for GOC.    Primary Decision Maker LEGAL GUARDIAN - Olam Pouch and Petrina Dover through [employer]  Discussion: Chart reviewed including labs, progress notes, imaging from this and previous encounters.  CT chest abdomen reviewed- T12 and L5 compression fractures. Developing infiltrates. Per SLP note- unable to eval this morning due to being obtunded.  Discussed with RN- she had desatting event while eating lunch.  Case discussed with attending MD and SLP. On my evaluation she was awake and alert, oriented x 1.  Called and spoke with Petrina Dover. Medical update given. Expressed concern re: recurrent aspiration pnuemonia, progression of dementia due to acute illness. Prior to admission Andrize believes patient was ambulating. Did not feed herself. Uncertain if continent. Reports she could speak but was unintelligible.  Patient is full code. Encouraged to consider DNR/DNI status understanding evidenced based poor outcomes in similar hospitalized patients, as the cause of the arrest is  likely associated with chronic/terminal disease rather than a reversible acute cardio-pulmonary event.  Additionally recommended no feeding tube as this is contraindicated in patient's with dementia. Further recommended consideration of transition to comfort/hospice if patient fails to improve.  Andrize requested a written letter signed by two providers be sent with recommendations. I sent letter signed by myself and Dr. [place] to Andrize at [email or web address]. Will await response.      SUMMARY OF RECOMMENDATIONS -Aspiration pnuemonia, covid in setting of advanced dementia- letter sent with recommendations to patient's legal guardian- await response -PMT will continue to follow    Code Status/Advance Care Planning:   Code Status: Full Code    Prognosis:   Unable to determine  Discharge Planning: To Be Determined  Primary Diagnoses: Present on Admission:  HCAP (healthcare-associated pneumonia)   Review of Systems  Unable to perform ROS: Mental status change    Physical Exam Vitals and nursing note reviewed.  Constitutional:      Appearance: She is ill-appearing.  Pulmonary:     Effort: Pulmonary effort is normal.  Neurological:     Mental Status: She is alert. She is disoriented.     Vital Signs: BP 129/60 (BP Location: Left Arm)   Pulse (!) 112   Temp 98.2 F (36.8 C)   Resp 16   Wt 78.4 kg   SpO2 95%   BMI  27.90 kg/m  Pain Scale: Faces   Pain Score: 3    SpO2: SpO2: 95 % O2 Device:SpO2: 95 % O2 Flow Rate: .O2 Flow Rate (L/min): 2 L/min  IO: Intake/output summary:  Intake/Output Summary (Last 24 hours) at 06/02/2024 1429 Last data filed at 06/02/2024 0056 Gross per 24 hour  Intake 220 ml  Output --  Net 220 ml    LBM: Last BM Date :  (prior to admission) Baseline Weight: Weight: (P) 80.1 kg Most recent weight: Weight: 78.4 kg       Thank you for this consult. Palliative medicine will continue to follow and assist as needed.  Time  Total: 90 minutes Signed by: Cassondra Stain, AGNP-C Palliative Medicine  Time includes:   Preparing to see the patient (e.g., review of tests) Obtaining and/or reviewing separately obtained history Performing a medically necessary appropriate examination and/or evaluation Counseling and educating the patient/family/caregiver Ordering medications, tests, or procedures Referring and communicating with other health care professionals (when not reported separately) Documenting clinical information in the electronic or other health record Independently interpreting results (not reported separately) and communicating results to the patient/family/caregiver Care coordination (not reported separately) Clinical documentation   Please contact Palliative Medicine Team phone at [phone] for questions and concerns.  For individual provider: See Tracey

## 2024-06-02 NOTE — Progress Notes (Signed)
 PROGRESS NOTE    Tracy Dickerson  FMW:[id number] DOB: 11/17/1940 DOA: 06/01/2024 PCP: Default, Provider, MD    Brief Narrative:   Tracy Dickerson is a 83 y.o. female with past medical history significant for HTN, HLD, CAD, paroxysmal atrial fibrillation on Eliquis , COPD/pulmonary fibrosis, dementia, GERD who presented to [facility] ED on 06/01/2024 via EMS from [facility] complaining of confusion and back pain.  Patient unable to provide any history due to her dementia.  Recently admitted July 2025 with pneumonia.  In the ED, temperature 97.5 F, HR 103, RR 16, BP 125/71, SpO2 97% on room air.  WBC 13.7, hemoglobin 15.1, platelet count 173.  Sodium 143, potassium 4.9, chloride 105, CO2 25, glucose 98, BUN 20, creatinine 0.96.  AST 57, ALT 14, total bilirubin 0.7.  Lactic acid 1.7.  COVID PCR positive.  RSV/influenza A/B PCR negative.  CT head without contrast with no evidence for acute intracranial malady, noted atrophy and mild chronic small vessel ischemic changes of the white matter.  CT chest/abdomen/pelvis with acute fracture superior aspect T12 vertebrae and L5 vertebrae, similar appearance L1 compression fracture, partial occlusion in the right lower lobe bronchus likely related to mucus impaction versus aspiration, although endobronchial lesion not excluded, patchy areas of increased density right lung base scarring/fibrosis versus developing infiltrate, mild bilateral hydronephrosis, right greater than left without obstructing stone.  Patient was started on IV antibiotics.  TRH consulted for admission for further evaluation and management of confusion, aspiration pneumonia.  Assessment & Plan:   Sepsis, POA Aspiration pneumonia Patient presenting with confusion; in the setting of advanced dementia.  Patient afebrile with elevated WBC count of 13.7.  CT chest with findings of partial occlusion right lower lobe bronchus secondary to mucus impaction versus aspiration; although endobronchial  lesion not excluded; additionally patchy areas of increased density right lung base concerning for developing infiltrate. -- Unasyn  1.5 g IV every 6 hours -- Continue supplemental oxygen, aim to SpO2 greater than 88%  COVID viral infection COVID-19 PCR positive.  Imaging findings not consistent with viral pneumonia. -- Continue airborne/contact isolation precautions -- Supportive care  Spinal compression fractures CT chest/abdomen/pelvis with acute findings of T12 and L5 compression fractures, known history of L1 compression fracture.  Patient currently resides at [facility]. --Tramadol  50 mg p.o. every 6 hours as needed moderate pain  -- PT/OT evaluation  HTN Currently not on antihypertensives outpatient -- BP 92/53, continue to monitor  Paroxysmal atrial fibrillation --Currently not on any rate controlling medications outpatient -- Eliquis  5 mg p.o. twice daily  CAD HLD -- Atorvastatin  20 mg p.o. daily  COPD/pulmonary fibrosis -- Breo Ellipta  1 puff daily (substituted for home Advair) -- Incruse Ellipta  1 puff daily (substituted for home Spiriva ) -- Continue home Cortef  5mg  PO daily -- Albuterol  neb q2h PRN wheezing/shortness of breath  GERD -- Protonix  40 mg p.o. 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 every 12 hours -- Palliative care consultation for assistance with goals of care/medical decision making given overall prognosis poor   DVT prophylaxis:  apixaban  (ELIQUIS ) tablet 5 mg    Code Status: Full Code Family Communication: No family present at bedside this morning  Disposition Plan:  Level of care: Med-Surg Status is: Inpatient Remains inpatient appropriate because: IV antibiotics    Consultants:  Palliative care  Procedures:  None  Antimicrobials:  Cefepime  9/9 - 9/10 Flagyl  9/9 - 9/9  Vancomycin  9/9 - 9/9 Unasyn   9/10>>   Subjective: Patient seen examined bedside, lying in bed.  Sleeping, opens eyes to command otherwise poorly interactive/communicative.  No family present at bedside.  Unable to obtain any further ROS.  No acute events overnight per nursing staff.    Given overall decline, advanced dementia and recurrent pneumonia likely aspiration will consult palliative care for assistance with goals of care/medical decision making, would benefit from transitioning to hospice/comfort measures.  Objective: Vitals:   06/02/24 0226 06/02/24 0435 06/02/24 0700 06/02/24 0755  BP: (!) 98/54 (!) 92/53  93/61  Pulse: (!) 111 99  100  Resp: 20 16    Temp: (!) 97.2 F (36.2 C) 98.7 F (37.1 C)    TempSrc: Axillary     SpO2: 95% 99%  96%  Weight:   78.4 kg     Intake/Output Summary (Last 24 hours) at 06/02/2024 1137 Last data filed at 06/02/2024 0056 Gross per 24 hour  Intake 220 ml  Output --  Net 220 ml   Filed Weights   06/01/24 1810 06/02/24 0700  Weight: (P) 80.1 kg 78.4 kg    Examination:  Physical Exam: GEN: NAD, opens eyes to command, chronically ill/elderly in appearance, poorly interactive HEENT: NCAT, PERRL, sclera clear, dry mucous membranes PULM: Breath sounds diminished bilateral bases, crackles right base, no wheezing, normal respiratory effort without accessory muscle use; on 2 L nasal cannula with SpO2 96% at rest CV: RRR w/o M/G/R GI: abd soft, NTND, + BS MSK: no peripheral edema Integumentary: No concerning rashes/lesions/wounds noted on exposed skin surfaces    Data Reviewed: I have personally reviewed following labs and imaging studies  CBC: Recent Labs  Lab 06/01/24 1155 06/02/24 0625  WBC 13.7* 13.8*  NEUTROABS 11.7*  --   HGB 15.1* 11.2*  HCT 47.3* 36.9  MCV 98.5 97.4  PLT 173 161   Basic Metabolic Panel: Recent Labs  Lab 06/01/24 1300 06/02/24 0625  NA 143 142  K 4.9 4.0  CL 105 108  CO2 25 21*  GLUCOSE 98 103*  BUN 20 19  CREATININE 0.96  0.93  CALCIUM  10.1 9.2   GFR: Estimated Creatinine Clearance: 51 mL/min (by C-G formula based on SCr of 0.93 mg/dL). Liver Function Tests: Recent Labs  Lab 06/01/24 1300  AST 57*  ALT 14  ALKPHOS 100  BILITOT 0.8  PROT 7.8  ALBUMIN 4.0   Recent Labs  Lab 06/01/24 1300  LIPASE 15   No results for input(s): AMMONIA in the last 168 hours. Coagulation Profile: No results for input(s): INR, PROTIME in the last 168 hours. Cardiac Enzymes: No results for input(s): CKTOTAL, CKMB, CKMBINDEX, TROPONINI in the last 168 hours. BNP (last 3 results) No results for input(s): PROBNP in the last 8760 hours. HbA1C: No results for input(s): HGBA1C in the last 72 hours. CBG: No results for input(s): GLUCAP in the last 168 hours. Lipid Profile: No results for input(s): CHOL, HDL, LDLCALC, TRIG, CHOLHDL, LDLDIRECT in the last 72 hours. Thyroid  Function Tests: No results for input(s): TSH, T4TOTAL, FREET4, T3FREE, THYROIDAB in the last 72 hours. Anemia Panel: No results for input(s): VITAMINB12, FOLATE, FERRITIN, TIBC, IRON , RETICCTPCT in the last 72 hours. Sepsis Labs: Recent Labs  Lab 06/01/24 1300 06/01/24 1450  LATICACIDVEN 1.7 1.7    Recent Results (from the past 240 hours)  Blood culture (routine x 2)     Status: None (Preliminary result)   Collection Time: 06/01/24  1:11 PM   Specimen: BLOOD  LEFT ARM  Result Value Ref Range Status   Specimen Description   Final    BLOOD LEFT ARM Performed at [facility], [address], [place], [place] [address]    Special Requests   Final    BOTTLES DRAWN AEROBIC AND ANAEROBIC Blood Culture results may not be optimal due to an inadequate volume of blood received in culture bottles Performed at [facility], [address] [place], [place] [address]    Culture   Final    NO GROWTH < 24 HOURS Performed at [facility], [address], [place],  [place] [address]    Report Status PENDING  Incomplete  Resp panel by RT-PCR (RSV, Flu A&B, Covid)     Status: Abnormal   Collection Time: 06/01/24  1:20 PM  Result Value Ref Range Status   SARS Coronavirus 2 by RT PCR POSITIVE (A) NEGATIVE Final    Comment: (NOTE) SARS-CoV-2 target nucleic acids are DETECTED.  The SARS-CoV-2 RNA is generally detectable in upper respiratory specimens during the acute phase of infection. Positive results are indicative of the presence of the identified virus, but do not rule out bacterial infection or co-infection with other pathogens not detected by the test. Clinical correlation with patient history and other diagnostic information is necessary to determine patient infection status. The expected result is Negative.  Fact Sheet for Patients: BloggerCourse.com  Fact Sheet for Healthcare Providers: SeriousBroker.it  This test is not yet approved or cleared by the [place]  FDA and  has been authorized for detection and/or diagnosis of SARS-CoV-2 by FDA under an Emergency Use Authorization (EUA).  This EUA will remain in effect (meaning this test can be used) for the duration of  the COVID-19 declaration under Section 564(b)(1) of the A ct, 21 U.S.C. section 360bbb-3(b)(1), unless the authorization is terminated or revoked sooner.     Influenza A by PCR NEGATIVE NEGATIVE Final   Influenza B by PCR NEGATIVE NEGATIVE Final    Comment: (NOTE) The Xpert Xpress SARS-CoV-2/FLU/RSV plus assay is intended as an aid in the diagnosis of influenza from Nasopharyngeal swab specimens and should not be used as a sole basis for treatment. Nasal washings and aspirates are unacceptable for Xpert Xpress SARS-CoV-2/FLU/RSV testing.  Fact Sheet for Patients: BloggerCourse.com  Fact Sheet for Healthcare Providers: SeriousBroker.it  This test is not yet approved or  cleared by the [place]  FDA and has been authorized for detection and/or diagnosis of SARS-CoV-2 by FDA under an Emergency Use Authorization (EUA). This EUA will remain in effect (meaning this test can be used) for the duration of the COVID-19 declaration under Section 564(b)(1) of the Act, 21 U.S.C. section 360bbb-3(b)(1), unless the authorization is terminated or revoked.     Resp Syncytial Virus by PCR NEGATIVE NEGATIVE Final    Comment: (NOTE) Fact Sheet for Patients: BloggerCourse.com  Fact Sheet for Healthcare Providers: SeriousBroker.it  This test is not yet approved or cleared by the [place]  FDA and has been authorized for detection and/or diagnosis of SARS-CoV-2 by FDA under an Emergency Use Authorization (EUA). This EUA will remain in effect (meaning this test can be used) for the duration of the COVID-19 declaration under Section 564(b)(1) of the Act, 21 U.S.C. section 360bbb-3(b)(1), unless the authorization is terminated or revoked.  Performed at [facility], [address] [place], [place] [address]   Blood culture (routine x 2)     Status: None (Preliminary result)   Collection  Time: 06/01/24  7:26 PM   Specimen: BLOOD RIGHT HAND  Result Value Ref Range Status   Specimen Description   Final    BLOOD RIGHT HAND Performed at [facility], [address], [place], [place] [address]    Special Requests   Final    BOTTLES DRAWN AEROBIC ONLY Blood Culture results may not be optimal due to an inadequate volume of blood received in culture bottles Performed at [facility], [address] [place], [place] [address]    Culture PENDING  Incomplete   Report Status PENDING  Incomplete         Radiology Studies: CT CHEST ABDOMEN PELVIS W CONTRAST Result Date: 06/01/2024 CLINICAL DATA:  Altered mental status.  Generalized abdominal pain. EXAM: CT CHEST, ABDOMEN,  AND PELVIS WITH CONTRAST TECHNIQUE: Multidetector CT imaging of the chest, abdomen and pelvis was performed following the standard protocol during bolus administration of intravenous contrast. RADIATION DOSE REDUCTION: This exam was performed according to the departmental dose-optimization program which includes automated exposure control, adjustment of the mA and/or kV according to patient size and/or use of iterative reconstruction technique. CONTRAST:  OMNIPAQUE  IOHEXOL  300 MG/ML  SOLN COMPARISON:  CT dated 03/26/2024. FINDINGS: CT CHEST FINDINGS Cardiovascular: There is no cardiomegaly or pericardial effusion. Mild atherosclerotic calcification of the thoracic aorta. No aneurysmal dilatation or dissection. The origins of the great vessels of the aortic arch and the central pulmonary arteries appear patent. Mediastinum/Nodes: Mildly enlarged subcarinal lymph node measures 11 mm short axis. The esophagus is grossly unremarkable. No mediastinal fluid collection. Lungs/Pleura: There is diffuse interstitial coarsening and subpleural reticulation consistent with pulmonary fibrosis and interstitial lung disease. There is partial occlusion of the right lower lobe bronchus, likely related to mucous impaction or aspiration. An endobronchial lesion is not excluded. Patchy area of increased density at the right lung base may represent scarring/fibrosis or developing infiltrate. Interval resolution of the previously seen nodular density at the left lung base. There is no pleural effusion or pneumothorax. Musculoskeletal: Degenerative changes of the spine and osteopenia. There is fracture of the superior aspect of the T12 vertebra, new since the prior CT, likely acute. CT ABDOMEN PELVIS FINDINGS No intra-abdominal free air or free fluid. Hepatobiliary: The liver is unremarkable. No biliary dilatation. The gallbladder is unremarkable Pancreas: Unremarkable. No pancreatic ductal dilatation or surrounding inflammatory  changes. Spleen: Normal in size without focal abnormality. Adrenals/Urinary Tract: The adrenal glands unremarkable. Bilateral renal cysts as seen previously. Follow-up left renal lesion as per recommendation of prior CT. There is mild bilateral hydronephrosis, right greater than left, and new since the prior CT. No obstructing stone. The urinary bladder is mildly distended and grossly unremarkable. Stomach/Bowel: There is no bowel obstruction. There is mild distal colonic diverticulosis. No active inflammatory changes. Appendectomy. Vascular/Lymphatic: Moderate aortoiliac atherosclerotic disease. 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 The IVC is unremarkable. No portal venous gas. There is no adenopathy. Reproductive: Hysterectomy. Other: None Musculoskeletal: Relatively similar appearance of L1 compression fracture and greater than 50% loss of vertebral body height with fragmentation of the superior endplate. Approximately 3 mm retropulsion of the superior posterior cortex. There is fracture of the L5 vertebra, new since the prior CT, acute. There is advanced osteopenia. IMPRESSION: 1. Acute fracture of the superior aspect of the T12 vertebra and L5 vertebra. 2. Relatively similar appearance of L1 compression fracture. 3. Partial occlusion of the right lower lobe bronchus, likely related to mucous impaction or aspiration. An  endobronchial lesion is not excluded. 4. Patchy area of increased density at the right lung base may represent scarring/fibrosis or developing infiltrate. 5. Mild bilateral hydronephrosis, right greater than left, and new since the prior CT. No obstructing stone. 6.  Aortic Atherosclerosis (ICD10-I70.0). Electronically Signed   By: Vanetta Chou M.D.   On: 06/01/2024 15:40   CT Head Wo Contrast Result Date: 06/01/2024 CLINICAL DATA:  Altered mental status EXAM: CT HEAD WITHOUT CONTRAST TECHNIQUE: Contiguous axial images were obtained from the base of the skull through the  vertex without intravenous contrast. RADIATION DOSE REDUCTION: This exam was performed according to the departmental dose-optimization program which includes automated exposure control, adjustment of the mA and/or kV according to patient size and/or use of iterative reconstruction technique. COMPARISON:  CT brain 03/26/2024 FINDINGS: Brain: No acute territorial infarction, hemorrhage or intracranial mass. Atrophy and mild chronic small vessel ischemic changes of the white matter. Stable ventricle size. Vascular: No hyperdense vessels.  Carotid vascular calcification Skull: Normal. Negative for fracture or focal lesion. Sinuses/Orbits: No acute finding. Other: None IMPRESSION: 1. No CT evidence for acute intracranial abnormality. 2. Atrophy and mild chronic small vessel ischemic changes of the white matter. Electronically Signed   By: Luke Bun M.D.   On: 06/01/2024 15:34   DG Chest Portable 1 View Result Date: 06/01/2024 CLINICAL DATA:  Altered level of consciousness EXAM: PORTABLE CHEST 1 VIEW COMPARISON:  03/26/2024 FINDINGS: Single frontal view of the chest demonstrates an unremarkable cardiac silhouette. Chronic areas of scarring and fibrosis are seen bilaterally within the lungs, right greater than left. No airspace disease, effusion, or pneumothorax. No acute bony abnormalities. IMPRESSION: 1. Chronic scarring and fibrosis.  No acute airspace disease. Electronically Signed   By: Ozell Daring M.D.   On: 06/01/2024 14:57        Scheduled Meds:  apixaban   5 mg Oral BID   atorvastatin   20 mg Oral QHS   fluticasone  furoate-vilanterol  1 puff Inhalation Daily   gabapentin   600 mg Oral QHS   hydrocortisone   5 mg Oral Daily   loratadine   10 mg Oral Daily   LORazepam   0.5 mg Oral Q12H   pantoprazole   40 mg Oral Daily   QUEtiapine   300 mg Oral QHS   senna-docusate  2 tablet Oral BID   umeclidinium bromide   1 puff Inhalation Daily   Continuous Infusions:  ampicillin -sulbactam (UNASYN ) IV        LOS: 1 day    Time spent: 52 minutes spent on 06/02/2024 caring for this patient face-to-face including chart review, ordering labs/tests, documenting, discussion with nursing staff, consultants, updating family and interview/physical exam    Camellia PARAS [place], DO [facility] Available via Epic secure chat 7am-7pm After these hours, please refer to coverage provider listed on [email or web address] 06/02/2024, 11:37 AM

## 2024-06-02 NOTE — TOC Initial Note (Addendum)
 Transition of Care [facility]) - Initial/Assessment Note    Patient Details  Name: Tracy Dickerson MRN: [id number] Date of Birth: 10-21-1940  [facility] CM/SW Contact:    Doneta Glenys DASEN, RN Phone Number: 06/02/2024, 9:50 AM  Clinical Narrative:                 [address] [place], [place] [address]  [phone] option 1 CM called [facility], left message. CM called Olam Fallow ([facility]) and she requested I called [facility]. 3:26 PM CM called [facility], left message. 4:40 PM CM called [place], spoke with [facility] intake. She will come by tomorrow to evaluate whether she agrees with the SNF recommendation.     Patient Goals and CMS Choice            Expected Discharge Plan and Services                                              Prior Living Arrangements/Services                       Activities of Daily Living      Permission Sought/Granted                  Emotional Assessment              Admission diagnosis:  HCAP (healthcare-associated pneumonia) [J18.9] Altered mental status, unspecified altered mental status type [R41.82] Pneumonia due to infectious organism, unspecified laterality, unspecified part of lung [J18.9] Patient Active Problem List   Diagnosis Date Noted   HCAP (healthcare-associated pneumonia) 06/01/2024   Cough 03/26/2024   Fever 03/26/2024   Atrial fibrillation, chronic (HCC) 03/26/2024   Sepsis due to pneumonia (HCC) 03/26/2024   Atypical pneumonia 11/21/2023   ILD (interstitial lung disease) (HCC) 11/21/2023   Dementia with behavioral disturbance (HCC) 11/21/2023   Low serum cortisol level 09/02/2023   Headache 08/30/2023   Vitamin D  deficiency 08/28/2023   Dizziness 08/25/2023   Iron  deficiency anemia 08/23/2023   Iron  deficiency anemia due to chronic blood loss 08/21/2023   Syncope 08/20/2023   Normocytic anemia 08/20/2023   Obesity (BMI 30-39.9)  08/20/2023   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/2023   Vitamin B12 deficiency 06/20/2023   Carotid stenosis 06/20/2023   Dementia (HCC) 06/19/2023   Generalized weakness 06/18/2023   Acute on chronic anemia 06/18/2023   H/O: upper GI bleed 06/18/2023   Chronic anticoagulation 06/18/2023   Infestation by bed bug 06/18/2023   CKD stage 3a, GFR 45-59 ml/min (HCC) 01/07/2023   Overweight (BMI 25.0-29.9) 01/06/2023   Type 2 diabetes mellitus with hyperlipidemia (HCC) 01/06/2023   COPD (chronic obstructive pulmonary disease) (HCC) 01/06/2023   Hypokalemia 01/06/2023   UGIB (upper gastrointestinal bleed) 01/05/2023   Unsatisfactory living conditions 01/05/2023   Constipation    UTI (urinary tract infection) 08/13/2020   HLD (hyperlipidemia) 08/13/2020   Acute renal failure superimposed on stage 3a chronic kidney disease (HCC) 08/13/2020   PAF (paroxysmal atrial fibrillation) (HCC) 08/13/2020   HTN (hypertension) 08/13/2020   Dehydration    Dehydration, moderate    Acute renal failure (HCC) 04/22/2020   Hyperkalemia    Septic shock (HCC)    Sepsis (HCC) 03/13/2018   Acute kidney injury superimposed on chronic kidney disease (HCC) 02/13/2018  Closed extra-articular fracture of distal tibia 02/02/2018   Community acquired bilateral lower lobe pneumonia 10/29/2017   Orthostatic hypotension 01/26/2017   Lung nodule 05/30/2014   Sleep apnea 05/10/2014   Barrett's esophagus 05/13/2007   Malignant neoplasm of breast (female) (HCC) 06/01/1997   PCP:  Default, Provider, MD Pharmacy:   [facility] - [place], [place] - [address] [place] [address] Phone: [phone] Fax: [phone]  [facility] - [place], [place] - [address] [place] [address] Phone: [phone] Fax: [phone]  [id number] [place], [place] - [address] AT [facility] OF [address] [place] [address] Phone: [phone] Fax: [phone]  [id number] - [place], [place] - [address] AT [facility] [address] [facility] [address] [place] [address] Phone: [phone] Fax: [phone]     Social Drivers of Health (SDOH) Social History: SDOH Screenings   Food Insecurity: No Food Insecurity (11/21/2023)  Housing: Low Risk  (11/21/2023)  Transportation Needs: Patient Unable To Answer (06/02/2024)  Utilities: Not At Risk (11/21/2023)  Financial Resource Strain: Low Risk  (02/04/2023)   Received from [facility]  Physical Activity: Inactive (07/16/2021)   Received from [facility]  Social Connections: Patient Unable To Answer (11/21/2023)  Stress: No Stress Concern Present (07/16/2021)   Received from [facility]  Tobacco Use: Medium Risk (03/26/2024)  Health Literacy: Medium Risk (07/16/2021)   Received from [facility]   SDOH Interventions:     Readmission Risk Interventions    03/27/2024   11:14 AM  Readmission Risk Prevention Plan  Transportation Screening Complete  Medication Review (RN Care Manager) Complete  PCP or Specialist appointment within 3-5 days of discharge Complete  HRI or Home Care Consult Complete  SW Recovery Care/Counseling Consult Complete  Palliative Care Screening Not Applicable  Skilled Nursing Facility Not Applicable

## 2024-06-02 NOTE — Progress Notes (Signed)
 MEWS Progress Note  Patient Details Name: Tracy Dickerson MRN: [id number] DOB: 12/22/1940 Today's Date: 06/02/2024   MEWS Flowsheet Documentation:  Assess: MEWS Score Temp: 98.2 F (36.8 C) BP: 129/60 MAP (mmHg): 76 Pulse Rate: (!) 112 ECG Heart Rate: 100 Resp: 16 Level of Consciousness: Alert SpO2: (!) 71 % O2 Device: Room Air O2 Flow Rate (L/min): 2 L/min Assess: MEWS Score MEWS Temp: 0 MEWS Systolic: 0 MEWS Pulse: 2 MEWS RR: 0 MEWS LOC: 0 MEWS Score: 2 MEWS Score Color: Yellow Assess: SIRS CRITERIA SIRS Temperature : 0 SIRS Respirations : 0 SIRS Pulse: 1 SIRS WBC: 0 SIRS Score Sum : 1 Assess: if the MEWS score is Yellow or Red Were vital signs accurate and taken at a resting state?: Yes Does the patient meet 2 or more of the SIRS criteria?: No MEWS guidelines implemented : Yes, yellow Treat MEWS Interventions: Considered administering scheduled or prn medications/treatments as ordered Take Vital Signs Increase Vital Sign Frequency : Yellow: Q2hr x1, continue Q4hrs until patient remains green for 12hrs Escalate MEWS: Escalate: Yellow: Discuss with charge nurse and consider notifying provider and/or RRT Notify: Charge Nurse/RN Name of Charge Nurse/RN Notified: Adult nurse Name/Title: [place] Date Provider Notified: 06/02/24 Time Provider Notified: 1315 Method of Notification: Page Notification Reason: Change in status Provider response: See new orders Date of Provider Response: 06/02/24 Time of Provider Response: 1315      Ryah Cribb G Antonie Borjon 06/02/2024, 1:15 PM

## 2024-06-02 NOTE — Evaluation (Signed)
 Clinical/Bedside Swallow Evaluation Patient Details  Name: Tracy Dickerson MRN: [id number] Date of Birth: 1940-10-08  Today's Date: 06/02/2024 Time: SLP Start Time (ACUTE ONLY): 1501 SLP Stop Time (ACUTE ONLY): 1536 SLP Time Calculation (min) (ACUTE ONLY): 35 min  Past Medical History:  Past Medical History:  Diagnosis Date   Cancer (HCC)    Breast and lung   Diabetes mellitus without complication (HCC)    GERD (gastroesophageal reflux disease)    Heart disease    Hypertension    Past Surgical History:  Past Surgical History:  Procedure Laterality Date   ABDOMINAL HYSTERECTOMY     APPENDECTOMY     BREAST SURGERY     breast cancer LEFT   COLONOSCOPY WITH PROPOFOL  N/A 08/23/2023   Procedure: COLONOSCOPY WITH PROPOFOL ;  Surgeon: Unk Corinn Skiff, MD;  Location: [facility] ENDOSCOPY;  Service: Gastroenterology;  Laterality: N/A;   ESOPHAGOGASTRODUODENOSCOPY N/A 08/23/2023   Procedure: ESOPHAGOGASTRODUODENOSCOPY (EGD);  Surgeon: Unk Corinn Skiff, MD;  Location[facility] ENDOSCOPY;  Service: Gastroenterology;  Laterality: N/A;   ESOPHAGOGASTRODUODENOSCOPY (EGD) WITH PROPOFOL  N/A 01/07/2023   Procedure: ESOPHAGOGASTRODUODENOSCOPY (EGD) WITH PROPOFOL ;  Surgeon: Jinny Carmine, MD;  Location: [facility] ENDOSCOPY;  Service: Endoscopy;  Laterality: N/A;   GIVENS CAPSULE STUDY N/A 08/23/2023   Procedure: GIVENS CAPSULE STUDY;  Surgeon: Unk Corinn Skiff, MD;  Location: [facility] ENDOSCOPY;  Service: Gastroenterology;  Laterality: N/A;   OPEN REDUCTION INTERNAL FIXATION (ORIF) TIBIA/FIBULA FRACTURE Left 02/03/2018   Procedure: OPEN REDUCTION INTERNAL FIXATION (ORIF) DISTAL TIBIA FRACTURE;  Surgeon: Edie Norleen PARAS, MD;  Location: [facility] ORS;  Service: Orthopedics;  Laterality: Left;  ankle/lower leg   TONSILLECTOMY     HPI:  83 y.o. female with medical history significant for advanced dementia, GERD, hypertension, Afib on Eliquis , recurrent pneumonia, breast and lung cancer now admitted with recurrent asp pna and  found to be COVID + as well as have new T12 and L5 compression fractures.  Pt has been seen previously for swallow evaluation with recommendation for mechanical softr/thin and medications with applesauce. She has undergone 2 prior endoscopies, which were normal.  Pt was lethargic earlier today and SLP advised palliative consult.  CT chest 06/01/2024 showed here is diffuse interstitial coarsening and  subpleural reticulation consistent with pulmonary fibrosis and  interstitial lung disease. There is partial occlusion of the right  lower lobe bronchus, likely related to mucous impaction or  aspiration. An endobronchial lesion is not excluded. Patchy area of  increased density at the right lung base may represent  scarring/fibrosis or developing infiltrate. Interval resolution of  the previously seen nodular density at the left lung base. There is  no pleural effusion or pneumothorax.    Pt resides at SNF prior to admit.    Assessment / Plan / Recommendation  Clinical Impression  Patient demonstrates clincial indications of dysphagia mostly likely related to her advanced dementia with potential exacerbation from her current illness/AMS/pulmonary infection/COVID.  She presented with minimally wet voice at baseline that cleared with cued cough.  Edentulous status contributed to prolong mastication with pt benefited from puree to aid oral transited of partially masticated solids.  She demonstrates overt cough with thin via straw - suspect due to overt inhalation - likely due to discoordination and prolonged suction.  Cued cough did not result in expectorates.  No overt indication of aspiration with small cup boluses spilled into anterior oral cavity nor tsps of thins.    Recommend soft/thin via tsp/cup bolus only with strict precautions.  Advised palliative consult and Kasie,  NP, has contacted guardian. Thankful for MD agreeing to consult and palliative input.will follow up to assure po tolerance and use of swallow  precautions. SLP Visit Diagnosis: Dysphagia, oropharyngeal phase (R13.12)    Aspiration Risk  Moderate aspiration risk;Risk for inadequate nutrition/hydration    Diet Recommendation Dysphagia 3 (Mech soft);Thin liquid    Liquid Administration via: Cup;No straw;Spoon Medication Administration: Whole meds with puree Supervision: Staff to assist with self feeding Compensations: Slow rate;Small sips/bites;Other (Comment) Postural Changes: Seated upright at 90 degrees;Remain upright for at least 30 minutes after po intake    Other  Recommendations Oral Care Recommendations: Oral care BID     Assistance Recommended at Discharge  Full assist  Functional Status Assessment Patient has had a recent decline in their functional status and/or demonstrates limited ability to make significant improvements in function in a reasonable and predictable amount of time  Frequency and Duration min 1 x/week  1 week       Prognosis Prognosis for improved oropharyngeal function: Fair      Swallow Study   General Date of Onset: 06/02/24 HPI: 83 y.o. female with medical history significant for advanced dementia, GERD, hypertension, Afib on Eliquis , recurrent pneumonia, breast and lung cancer now admitted with recurrent asp pna and found to be COVID + as well as have new T12 and L5 compression fractures.  Pt has been seen previously for swallow evaluation with recommendation for mechanical softr/thin and medications with applesauce. She has undergone 2 prior endoscopies, which were normal.  Pt was lethargic earlier today and SLP advised palliative consult.  CT chest 06/01/2024 showed here is diffuse interstitial coarsening and  subpleural reticulation consistent with pulmonary fibrosis and  interstitial lung disease. There is partial occlusion of the right  lower lobe bronchus, likely related to mucous impaction or  aspiration. An endobronchial lesion is not excluded. Patchy area of  increased density at the right  lung base may represent  scarring/fibrosis or developing infiltrate. Interval resolution of  the previously seen nodular density at the left lung base. There is  no pleural effusion or pneumothorax.    Pt resides at SNF prior to admit. Type of Study: Bedside Swallow Evaluation Previous Swallow Assessment: see HPI Diet Prior to this Study: Regular;Thin liquids (Level 0) Temperature Spikes Noted: No Respiratory Status: Nasal cannula History of Recent Intubation: No Behavior/Cognition: Lethargic/Drowsy;Distractible;Requires cueing Oral Cavity Assessment: Dry Oral Care Completed by SLP: No (pt eating a cookie) Oral Cavity - Dentition: Edentulous Vision: Impaired for self-feeding Self-Feeding Abilities: Total assist Patient Positioning: Upright in bed Baseline Vocal Quality: Low vocal intensity Volitional Cough: Cognitively unable to elicit Volitional Swallow: Unable to elicit    Oral/Motor/Sensory Function Overall Oral Motor/Sensory Function: Other (comment) (generalized weakness, no focal CN deficits from tasks pt could complete, which was limited)   Ice Chips Ice chips: Not tested   Thin Liquid Thin Liquid: Impaired Presentation: Cup;Self Fed;Spoon;Straw Oral Phase Impairments: Poor awareness of bolus;Reduced labial seal Pharyngeal  Phase Impairments: Cough - Immediate Other Comments: With 8/9 attempts, pt able to form suction on straw resulting in immediate overt congested coughing  post-swallow - concerning for airway infiltration without adequate clearance.  Small single cup and spoon boluses tolerated without s/s of aspiration but SlP had to essentially pour a small amount of liquid into anterior oral cavity aspt did not seal lips on cup.    Nectar Thick Nectar Thick Liquid: Impaired Presentation: Cup;Spoon Oral Phase Impairments: Reduced labial seal Pharyngeal Phase Impairments: Suspected delayed Swallow Other Comments:  Small single cup and spoon boluses tolerated without s/s of  aspiration but SlP had to essentially pour a small amount of liquid into anterior oral cavity aspt did not seal lips on cup.   Honey Thick Honey Thick Liquid: Not tested   Puree Puree: Impaired Presentation: Self Fed;Spoon Oral Phase Functional Implications: Oral residue Pharyngeal Phase Impairments: Suspected delayed Swallow   Solid     Solid: Impaired Oral Phase Impairments: Impaired mastication;Poor awareness of bolus Oral Phase Functional Implications: Prolonged oral transit;Impaired mastication;Oral holding Pharyngeal Phase Impairments: Suspected delayed Swallow;Other (comments)      Nicolas Emmie Caldron 06/02/2024,4:00 PM  Madelin POUR, MS [facility] SLP Acute Rehab Services Office [phone]

## 2024-06-02 NOTE — Evaluation (Signed)
 Occupational Therapy Evaluation Patient Details Name: Tracy Dickerson MRN: [id number] DOB: [birth year] Today's Date: 06/02/2024   History of Present Illness   Tracy Dickerson is a 83 y.o. female presented with increased confusion over her baseline and found to have evidence of recurrent pneumonia and COVID; as well as T12 and L5 compression fxs.  PHMx:  dementia, GERD, hypertension, atrial fibrillation, cancer (breast and lung), DM2, heart disease, and sepsis due to pneumonia earlier 2025     Clinical Impressions This 83 yo female admitted with above presents to acute OT with PLOF unknown due to pt unable to tell us  due to dementia and no family available. Currently pt is setup-total A for basic ADLs and total A +2 for all bed mobility and not agreeable to stand with us  today. She will continue to benefit from acute OT with follow up from continued inpatient follow up therapy, <3 hours/day.      If plan is discharge home, recommend the following:   Two people to help with walking and/or transfers;Two people to help with bathing/dressing/bathroom;Assistance with cooking/housework;Assistance with feeding;Help with stairs or ramp for entrance;Assist for transportation;Direct supervision/assist for financial management;Direct supervision/assist for medications management     Functional Status Assessment   Patient has had a recent decline in their functional status and demonstrates the ability to make significant improvements in function in a reasonable and predictable amount of time.     Equipment Recommendations   None recommended by OT      Precautions/Restrictions   Precautions Precautions: Fall;Back Precaution Booklet Issued: No Recall of Precautions/Restrictions: Impaired Restrictions Weight Bearing Restrictions Per Provider Order: No     Mobility Bed Mobility Overal bed mobility: Needs Assistance Bed Mobility: Rolling, Sidelying to Sit, Sit to Sidelying Rolling: Total  assist, +2 for physical assistance Sidelying to sit: Total assist, +2 for physical assistance     Sit to sidelying: Total assist, +2 for physical assistance      Transfers                   General transfer comment: Pt not agreeable to try and stand      Balance Overall balance assessment: Needs assistance Sitting-balance support: Bilateral upper extremity supported, Feet supported Sitting balance-Leahy Scale: Zero Sitting balance - Comments: posterior lean                                   ADL either performed or assessed with clinical judgement   ADL Overall ADL's : Needs assistance/impaired Eating/Feeding: Total assistance;Bed level   Grooming: Wash/dry face;Sitting Grooming Details (indicate cue type and reason): EOB, min A with total A for EOB balance Upper Body Bathing: Maximal assistance;Bed level   Lower Body Bathing: Total assistance;Bed level   Upper Body Dressing : Total assistance;Bed level   Lower Body Dressing: Total assistance;Bed level                       Vision Baseline Vision/History: 1 Wears glasses Ability to See in Adequate Light: 0 Adequate Patient Visual Report: No change from baseline              Pertinent Vitals/Pain Pain Assessment Pain Assessment: PAINAD Breathing: occasional labored breathing, short period of hyperventilation Negative Vocalization: occasional moan/groan, low speech, negative/disapproving quality Facial Expression: sad, frightened, frown Body Language: tense, distressed pacing, fidgeting Consolability: distracted or reassured by voice/touch PAINAD Score: 5 Pain  Intervention(s): Limited activity within patient's tolerance, Monitored during session, Repositioned, RN gave pain meds during session     Extremity/Trunk Assessment Upper Extremity Assessment Upper Extremity Assessment: Generalized weakness;Right hand dominant           Communication Communication Communication: No  apparent difficulties   Cognition Arousal: Alert, Lethargic (varied between the 2 while supine in bed, alert when up to EOB) Behavior During Therapy: Agitated, Anxious (due to back pain) Cognition: History of cognitive impairments                               Following commands: Impaired Following commands impaired: Follows one step commands inconsistently     Cueing   Cueing Techniques: Verbal cues;Gestural cues;Tactile cues;Visual cues              Home Living Family/patient expects to be discharged to:: Skilled nursing facility                                 Additional Comments: Pt admitted from [facility]      Prior Functioning/Environment Prior Level of Function : Patient poor historian/Family not available                    OT Problem List: Decreased strength;Decreased range of motion;Decreased activity tolerance;Impaired balance (sitting and/or standing);Decreased cognition;Decreased safety awareness;Pain   OT Treatment/Interventions: Self-care/ADL training;DME and/or AE instruction;Balance training;Patient/family education;Therapeutic activities      OT Goals(Current goals can be found in the care plan section)   Acute Rehab OT Goals Patient Stated Goal: to lay back down (due to pain in back) OT Goal Formulation: Patient unable to participate in goal setting Time For Goal Achievement: 06/16/24 Potential to Achieve Goals: Fair   OT Frequency:  Min 2X/week    Co-evaluation PT/OT/SLP Co-Evaluation/Treatment: Yes Reason for Co-Treatment: Necessary to address cognition/behavior during functional activity;For patient/therapist safety;To address functional/ADL transfers PT goals addressed during session: Mobility/safety with mobility;Balance;Strengthening/ROM OT goals addressed during session: Strengthening/ROM;ADL's and self-care      AM-PAC OT 6 Clicks Daily Activity     Outcome Measure Help from  another person eating meals?: Total Help from another person taking care of personal grooming?: A Lot Help from another person toileting, which includes using toliet, bedpan, or urinal?: Total Help from another person bathing (including washing, rinsing, drying)?: A Lot Help from another person to put on and taking off regular upper body clothing?: Total Help from another person to put on and taking off regular lower body clothing?: Total 6 Click Score: 8   End of Session Nurse Communication: Patient requests pain meds  Activity Tolerance: Patient limited by pain Patient left: in bed;with call bell/phone within reach;with bed alarm set;with nursing/sitter in room  OT Visit Diagnosis: Unsteadiness on feet (R26.81);Other abnormalities of gait and mobility (R26.89);Muscle weakness (generalized) (M62.81);Pain Pain - Right/Left:  (back when sitting, legs when getting OOB and once back in bed at rest)                Time: 1042-1100 OT Time Calculation (min): 18 min Charges:  OT General Charges $OT Visit: 1 Visit OT Evaluation $OT Eval Moderate Complexity: 1 Mod  Cathy L. OT Acute Rehabilitation Services Office [phone]    Tracy Dickerson 06/02/2024, 11:16 AM

## 2024-06-02 NOTE — Progress Notes (Addendum)
 SLP Cancellation Note  Patient Details Name: Tracy Dickerson MRN: [id number] DOB: [birth year]   Cancelled treatment:       Reason Eval/Treat Not Completed: Other (comment) (pt breathing with open mouth posture, did not fully open her eyes or answer SLP questions with moderate verbal and tactile *cold hands* cues, too lethargic at this time for po or eval; will continue efforts, messaged rn with findings.) Madelin POUR, MS [facility] SLP Acute Rehab Services Office [phone]   Palliative consult to establish GOC advised given her advanced dementia, COPD, COVID, new lumbar fx that will decrease her mobility significantly, likely further negatively impact her current respiratory status and Full code status.    Madelin POUR, MS [facility] SLP Acute Rehab Services Office [phone]  Nicolas Emmie Caldron 06/02/2024, 9:53 AM

## 2024-06-02 NOTE — Progress Notes (Signed)
 PHARMACY NOTE:  ANTIMICROBIAL RENAL DOSAGE ADJUSTMENT  Current antimicrobial regimen includes a mismatch between antimicrobial dosage and estimated renal function.  As per policy approved by the Pharmacy & Therapeutics and Medical Executive Committees, the antimicrobial dosage will be adjusted accordingly.  Current antimicrobial dosage:  Cefepime  2g IV q8  Indication: sepsis due to aspiration pneumonia  Renal Function:  Estimated Creatinine Clearance: 51 mL/min (by C-G formula based on SCr of 0.93 mg/dL). []      On intermittent HD, scheduled: []      On CRRT    Antimicrobial dosage has been changed to:  cefepime  2g IV q12  Additional comments:   Thank you for allowing pharmacy to be a part of this patient's care.  Britta Eva Na, [facility] 06/02/2024 9:31 AM

## 2024-06-02 NOTE — Evaluation (Signed)
 Physical Therapy Evaluation Patient Details Name: Tracy Dickerson MRN: [id number] DOB: [birth year] Today's Date: 06/02/2024  History of Present Illness  Tracy Dickerson is a 83 y.o. female presented with increased confusion over her baseline and found to have evidence of recurrent pneumonia and COVID; as well as T12 and L5 compression fxs.  PHMx:  dementia, GERD, hypertension, atrial fibrillation, cancer (breast and lung), DM2, heart disease, and sepsis due to pneumonia earlier 2025  Clinical Impression  Pt admitted with above diagnosis. Pt unable to provide PLOF, but per chart review was ambulatory and from [facility] Pt more alert once assisted to sitting bedside, reports back pain in sitting and improvement in supine. Max education on log roll with guidance through sequencing, but ultimately pt needing total A+2 for all bed mobility. Pt demonstrates generalized weakness throughout, needing max A to maintain static sitting EOB to prevent LOB posterior and only tolerates static sitting for <2 minutes. Unable to formally MMT due to cognition and pain. Pt on 2L O2 with SpO2 94% and HR 101 in sitting. Patient will benefit from continued inpatient follow up therapy, <3 hours/day. Pt currently with functional limitations due to the deficits listed below (see PT Problem List). Pt will benefit from acute skilled PT to increase their independence and safety with mobility to allow discharge.           If plan is discharge home, recommend the following: Two people to help with walking and/or transfers;Two people to help with bathing/dressing/bathroom;Assistance with cooking/housework;Assist for transportation;Supervision due to cognitive status   Can travel by private vehicle   No    Equipment Recommendations None recommended by PT  Recommendations for Other Services       Functional Status Assessment Patient has had a recent decline in their functional status and demonstrates the ability to make  significant improvements in function in a reasonable and predictable amount of time.     Precautions / Restrictions Precautions Precautions: Fall;Back Precaution Booklet Issued: No Recall of Precautions/Restrictions: Impaired Restrictions Weight Bearing Restrictions Per Provider Order: No      Mobility  Bed Mobility Overal bed mobility: Needs Assistance Bed Mobility: Rolling, Sidelying to Sit, Sit to Sidelying Rolling: Total assist, +2 for physical assistance Sidelying to sit: Total assist, +2 for physical assistance     Sit to sidelying: Total assist, +2 for physical assistance General bed mobility comments: total A for bed mobility, max cues and guiding through sequencing for mobility    Transfers                        Ambulation/Gait                  Stairs            Wheelchair Mobility     Tilt Bed    Modified Rankin (Stroke Patients Only)       Balance Overall balance assessment: Needs assistance Sitting-balance support: Bilateral upper extremity supported, Feet supported Sitting balance-Leahy Scale: Zero Sitting balance - Comments: posterior lean                                     Pertinent Vitals/Pain Pain Assessment Pain Assessment: PAINAD Breathing: occasional labored breathing, short period of hyperventilation Negative Vocalization: occasional moan/groan, low speech, negative/disapproving quality Facial Expression: sad, frightened, frown Body Language: tense, distressed pacing, fidgeting Consolability: distracted or  reassured by voice/touch PAINAD Score: 5 Pain Intervention(s): Limited activity within patient's tolerance, Monitored during session, Repositioned, RN gave pain meds during session    Home Living Family/patient expects to be discharged to:: Skilled nursing facility                   Additional Comments: per chart review, pt from [facility]    Prior Function Prior Level of  Function : Patient poor historian/Family not available             Mobility Comments: per chart review usually indpendent with ambulation also noted to amb 400 ft with RW and CGA 03/27/24 at this hospital; pt unable to provide PLOF ADLs Comments: pt unable to provide PLOF     Extremity/Trunk Assessment   Upper Extremity Assessment Upper Extremity Assessment: Defer to OT evaluation    Lower Extremity Assessment Lower Extremity Assessment: Generalized weakness       Communication   Communication Communication: No apparent difficulties    Cognition Arousal: Alert, Lethargic (varied between the 2 while supine in bed, alert when up to EOB) Behavior During Therapy: Agitated, Anxious (due to back pain)   PT - Cognitive impairments: No family/caregiver present to determine baseline                         Following commands: Impaired Following commands impaired: Follows one step commands inconsistently     Cueing Cueing Techniques: Verbal cues, Gestural cues, Tactile cues, Visual cues     General Comments      Exercises     Assessment/Plan    PT Assessment Patient needs continued PT services  PT Problem List Decreased strength;Decreased range of motion;Decreased activity tolerance;Decreased balance;Decreased mobility;Decreased coordination;Decreased cognition;Decreased knowledge of use of DME;Decreased safety awareness;Decreased knowledge of precautions;Pain       PT Treatment Interventions DME instruction;Gait training;Functional mobility training;Therapeutic activities;Therapeutic exercise;Balance training;Neuromuscular re-education;Cognitive remediation;Patient/family education;Wheelchair mobility training    PT Goals (Current goals can be found in the Care Plan section)  Acute Rehab PT Goals Patient Stated Goal: none stated PT Goal Formulation: Patient unable to participate in goal setting Time For Goal Achievement: 06/16/24 Potential to Achieve Goals:  Fair    Frequency Min 2X/week     Co-evaluation PT/OT/SLP Co-Evaluation/Treatment: Yes Reason for Co-Treatment: Necessary to address cognition/behavior during functional activity;For patient/therapist safety;To address functional/ADL transfers PT goals addressed during session: Mobility/safety with mobility;Balance;Strengthening/ROM OT goals addressed during session: Strengthening/ROM;ADL's and self-care       AM-PAC PT 6 Clicks Mobility  Outcome Measure Help needed turning from your back to your side while in a flat bed without using bedrails?: Total Help needed moving from lying on your back to sitting on the side of a flat bed without using bedrails?: Total Help needed moving to and from a bed to a chair (including a wheelchair)?: Total Help needed standing up from a chair using your arms (e.g., wheelchair or bedside chair)?: Total Help needed to walk in hospital room?: Total Help needed climbing 3-5 steps with a railing? : Total 6 Click Score: 6    End of Session Equipment Utilized During Treatment: Oxygen Activity Tolerance: Patient limited by pain Patient left: in bed;with call bell/phone within reach;with bed alarm set;with nursing/sitter in room Nurse Communication: Mobility status;Patient requests pain meds PT Visit Diagnosis: Other abnormalities of gait and mobility (R26.89);Muscle weakness (generalized) (M62.81)    Time: 8856-8796 PT Time Calculation (min) (ACUTE ONLY): 20 min   Charges:  PT Evaluation $PT Eval Moderate Complexity: 1 Mod   PT General Charges $$ ACUTE PT VISIT: 1 Visit         Tori Katrice Goel PT, DPT 06/02/24, 12:00 PM

## 2024-06-03 DIAGNOSIS — J189 Pneumonia, unspecified organism: Secondary | ICD-10-CM | POA: Diagnosis not present

## 2024-06-03 LAB — CBC
HCT: 29.6 % — ABNORMAL LOW (ref 36.0–46.0)
Hemoglobin: 9.6 g/dL — ABNORMAL LOW (ref 12.0–15.0)
MCH: 32.5 pg (ref 26.0–34.0)
MCHC: 32.4 g/dL (ref 30.0–36.0)
MCV: 100.3 fL — ABNORMAL HIGH (ref 80.0–100.0)
Platelets: 165 K/uL (ref 150–400)
RBC: 2.95 MIL/uL — ABNORMAL LOW (ref 3.87–5.11)
RDW: 14.7 % (ref 11.5–15.5)
WBC: 11.8 K/uL — ABNORMAL HIGH (ref 4.0–10.5)
nRBC: 0 % (ref 0.0–0.2)

## 2024-06-03 LAB — COMPREHENSIVE METABOLIC PANEL WITH GFR
ALT: 7 U/L (ref 0–44)
AST: 27 U/L (ref 15–41)
Albumin: 3 g/dL — ABNORMAL LOW (ref 3.5–5.0)
Alkaline Phosphatase: 91 U/L (ref 38–126)
Anion gap: 9 (ref 5–15)
BUN: 23 mg/dL (ref 8–23)
CO2: 24 mmol/L (ref 22–32)
Calcium: 8.9 mg/dL (ref 8.9–10.3)
Chloride: 110 mmol/L (ref 98–111)
Creatinine, Ser: 0.9 mg/dL (ref 0.44–1.00)
GFR, Estimated: >60 mL/min
Glucose, Bld: 105 mg/dL — ABNORMAL HIGH (ref 70–99)
Potassium: 3.9 mmol/L (ref 3.5–5.1)
Sodium: 143 mmol/L (ref 135–145)
Total Bilirubin: 0.5 mg/dL (ref 0.0–1.2)
Total Protein: 5.8 g/dL — ABNORMAL LOW (ref 6.5–8.1)

## 2024-06-03 LAB — MAGNESIUM: Magnesium: 2.4 mg/dL (ref 1.7–2.4)

## 2024-06-03 LAB — CORTISOL: Cortisol, Plasma: 7 ug/dL

## 2024-06-03 LAB — PHOSPHORUS: Phosphorus: 3 mg/dL (ref 2.5–4.6)

## 2024-06-03 MED ORDER — SODIUM CHLORIDE 0.9 % IV SOLN: INTRAVENOUS | Status: AC

## 2024-06-03 NOTE — NC FL2 (Signed)
   MEDICAID FL2 LEVEL OF CARE FORM     IDENTIFICATION  Patient Name: Tracy Dickerson Birthdate: 09-Jun-1941 Sex: female Admission Date (Current Location): 06/01/2024  [facility] and [place] Number:  Producer, television/film/video and Address:  [facility],  [address] [place], [place] [address]      Provider Number: [id number]  Attending Physician Name and Address:  [place], Eric J, DO  Relative Name and Phone Number:       Current Level of Care: Hospital Recommended Level of Care: Skilled Nursing Facility Prior Approval Number:    Date Approved/Denied:   PASRR Number: [id number]  Discharge Plan: SNF    Current Diagnoses: Patient Active Problem List   Diagnosis Date Noted   COVID 06/02/2024   Palliative care by specialist 06/02/2024   HCAP (healthcare-associated pneumonia) 06/01/2024   Cough 03/26/2024   Fever 03/26/2024   Atrial fibrillation, chronic (HCC) 03/26/2024   Sepsis due to pneumonia (HCC) 03/26/2024   Atypical pneumonia 11/21/2023   ILD (interstitial lung disease) (HCC) 11/21/2023   Dementia with behavioral disturbance (HCC) 11/21/2023   Low serum cortisol level 09/02/2023   Headache 08/30/2023   Vitamin D  deficiency 08/28/2023   Dizziness 08/25/2023   Iron  deficiency anemia 08/23/2023   Iron  deficiency anemia due to chronic blood loss 08/21/2023   Syncope 08/20/2023   Normocytic anemia 08/20/2023   Obesity (BMI 30-39.9) 08/20/2023   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/2023   Vitamin B12 deficiency 06/20/2023   Carotid stenosis 06/20/2023   Dementia (HCC) 06/19/2023   Generalized weakness 06/18/2023   Acute on chronic anemia 06/18/2023   H/O: upper GI bleed 06/18/2023   Chronic anticoagulation 06/18/2023   Infestation by bed bug 06/18/2023   CKD stage 3a, GFR 45-59 ml/min (HCC) 01/07/2023   Overweight (BMI 25.0-29.9) 01/06/2023   Type 2 diabetes mellitus with hyperlipidemia (HCC) 01/06/2023   COPD (chronic  obstructive pulmonary disease) (HCC) 01/06/2023   Hypokalemia 01/06/2023   UGIB (upper gastrointestinal bleed) 01/05/2023   Unsatisfactory living conditions 01/05/2023   Constipation    UTI (urinary tract infection) 08/13/2020   HLD (hyperlipidemia) 08/13/2020   Acute renal failure superimposed on stage 3a chronic kidney disease (HCC) 08/13/2020   PAF (paroxysmal atrial fibrillation) (HCC) 08/13/2020   HTN (hypertension) 08/13/2020   Dehydration    Dehydration, moderate    Acute renal failure (HCC) 04/22/2020   Hyperkalemia    Septic shock (HCC)    Sepsis (HCC) 03/13/2018   Acute kidney injury superimposed on chronic kidney disease (HCC) 02/13/2018   Closed extra-articular fracture of distal tibia 02/02/2018   Community acquired bilateral lower lobe pneumonia 10/29/2017   Orthostatic hypotension 01/26/2017   Lung nodule 05/30/2014   Sleep apnea 05/10/2014   Barrett's esophagus 05/13/2007   Malignant neoplasm of breast (female) (HCC) 06/01/1997    Orientation RESPIRATION BLADDER Height & Weight     Self, Place  Normal Incontinent Weight: 78.4 kg Height:     BEHAVIORAL SYMPTOMS/MOOD NEUROLOGICAL BOWEL NUTRITION STATUS      Continent Diet  AMBULATORY STATUS COMMUNICATION OF NEEDS Skin   Extensive Assist Verbally Normal                       Personal Care Assistance Level of Assistance  Bathing, Feeding, Dressing Bathing Assistance: Limited assistance Feeding assistance: Limited assistance Dressing Assistance: Limited assistance     Functional Limitations Info             SPECIAL CARE  FACTORS FREQUENCY  PT (By licensed PT), OT (By licensed OT)     PT Frequency: 5x weekly OT Frequency: 5x weekly            Contractures Contractures Info: Not present    Additional Factors Info  Code Status, Allergies, Psychotropic Code Status Info: FULL Allergies Info: Oxycodone , Penicillins, Percocet (Oxycodone -acetaminophen  Psychotropic Info: Quetiapine , Lorazepam           Current Medications (06/03/2024):  This is the current hospital active medication list Current Facility-Administered Medications  Medication Dose Route Frequency Provider Last Rate Last Admin   0.9 %  sodium chloride  infusion   Intravenous Continuous [place], Eric J, DO 75 mL/hr at 06/03/24 1042 New Bag at 06/03/24 1042   acetaminophen  (TYLENOL ) tablet 650 mg  650 mg Oral Q6H PRN Zella Katha HERO, MD       Or   acetaminophen  (TYLENOL ) suppository 650 mg  650 mg Rectal Q6H PRN Zella, Mir M, MD       albuterol  (PROVENTIL ) (2.5 MG/3ML) 0.083% nebulizer solution 2.5 mg  2.5 mg Nebulization Q2H PRN [place], Eric J, DO       ampicillin -sulbactam (UNASYN ) 1.5 g in sodium chloride  0.9 % 100 mL IVPB  1.5 g Intravenous Q6H Britta Eva HERO, RPH 200 mL/hr at 06/03/24 0626 1.5 g at 06/03/24 9373   apixaban  (ELIQUIS ) tablet 5 mg  5 mg Oral BID Zella Katha HERO, MD   5 mg at 06/03/24 9056   atorvastatin  (LIPITOR) tablet 20 mg  20 mg Oral QHS Zella, Mir M, MD   20 mg at 06/02/24 2151   fluticasone  furoate-vilanterol (BREO ELLIPTA ) 200-25 MCG/ACT 1 puff  1 puff Inhalation Daily Zella, Mir M, MD   1 puff at 06/03/24 0830   gabapentin  (NEURONTIN ) capsule 600 mg  600 mg Oral QHS Zella, Mir M, MD   600 mg at 06/02/24 2150   guaiFENesin  (ROBITUSSIN) 100 MG/5ML liquid 5 mL  5 mL Oral Q4H PRN Zella, Mir M, MD       hydrocortisone  (CORTEF ) tablet 5 mg  5 mg Oral Daily Ikramullah, Mir M, MD   5 mg at 06/03/24 9056   loratadine  (CLARITIN ) tablet 10 mg  10 mg Oral Daily Ikramullah, Mir M, MD   10 mg at 06/03/24 9056   LORazepam  (ATIVAN ) tablet 0.5 mg  0.5 mg Oral Q12H Zella, Mir M, MD   0.5 mg at 06/03/24 9056   ondansetron  (ZOFRAN ) tablet 4 mg  4 mg Oral Q6H PRN Zella Katha HERO, MD       Or   ondansetron  (ZOFRAN ) injection 4 mg  4 mg Intravenous Q6H PRN Zella, Mir M, MD       pantoprazole  (PROTONIX ) EC tablet 40 mg  40 mg Oral Daily Ikramullah, Mir M, MD   40 mg at 06/03/24  9056   QUEtiapine  (SEROQUEL ) tablet 300 mg  300 mg Oral QHS Zella, Mir M, MD   300 mg at 06/02/24 2150   senna-docusate (Senokot-S) tablet 2 tablet  2 tablet Oral BID Zella Katha HERO, MD   2 tablet at 06/03/24 9056   traMADol  (ULTRAM ) tablet 50 mg  50 mg Oral Q6H PRN Zella Katha HERO, MD   50 mg at 06/02/24 1058   traZODone  (DESYREL ) tablet 25 mg  25 mg Oral QHS PRN Zella Katha HERO, MD   25 mg at 06/01/24 2218   umeclidinium bromide  (INCRUSE ELLIPTA ) 62.5 MCG/ACT 1 puff  1 puff Inhalation Daily Zella Katha HERO, MD   1  puff at 06/03/24 0830     Discharge Medications: Please see discharge summary for a list of discharge medications.  Relevant Imaging Results:  Relevant Lab Results:   Additional Information SSN [id number]  Doneta Glenys DASEN, RN

## 2024-06-03 NOTE — Progress Notes (Signed)
 PROGRESS NOTE    Tracy Dickerson  FMW:[id number] DOB: 1941/05/21 DOA: 06/01/2024 PCP: Default, Provider, MD    Brief Narrative:   Tracy Dickerson is a 83 y.o. female with past medical history significant for HTN, HLD, CAD, paroxysmal atrial fibrillation on Eliquis , COPD/pulmonary fibrosis, dementia, GERD who presented to [facility] ED on 06/01/2024 via EMS from [facility] complaining of confusion and back pain.  Patient unable to provide any history due to her dementia.  Recently admitted July 2025 with pneumonia.  In the ED, temperature 97.5 F, HR 103, RR 16, BP 125/71, SpO2 97% on room air.  WBC 13.7, hemoglobin 15.1, platelet count 173.  Sodium 143, potassium 4.9, chloride 105, CO2 25, glucose 98, BUN 20, creatinine 0.96.  AST 57, ALT 14, total bilirubin 0.7.  Lactic acid 1.7.  COVID PCR positive.  RSV/influenza A/B PCR negative.  CT head without contrast with no evidence for acute intracranial malady, noted atrophy and mild chronic small vessel ischemic changes of the white matter.  CT chest/abdomen/pelvis with acute fracture superior aspect T12 vertebrae and L5 vertebrae, similar appearance L1 compression fracture, partial occlusion in the right lower lobe bronchus likely related to mucus impaction versus aspiration, although endobronchial lesion not excluded, patchy areas of increased density right lung base scarring/fibrosis versus developing infiltrate, mild bilateral hydronephrosis, right greater than left without obstructing stone.  Patient was started on IV antibiotics.  TRH consulted for admission for further evaluation and management of confusion, aspiration pneumonia.  Assessment & Plan:   Sepsis, POA Aspiration pneumonia Patient presenting with confusion; in the setting of advanced dementia.  Patient afebrile with elevated WBC count of 13.7.  CT chest with findings of partial occlusion right lower lobe bronchus secondary to mucus impaction versus aspiration; although endobronchial  lesion not excluded; additionally patchy areas of increased density right lung base concerning for developing infiltrate. -- Unasyn  1.5 g IV every 6 hours -- Continue supplemental oxygen, goal SpO2 > 88%  COVID viral infection COVID-19 PCR positive.  Imaging findings not consistent with viral pneumonia. -- Continue airborne/contact isolation precautions -- Supportive care  Spinal compression fractures CT chest/abdomen/pelvis with acute findings of T12 and L5 compression fractures, known history of L1 compression fracture.  Patient currently resides at [facility]. --Tramadol  50 mg p.o. every 6 hours as needed moderate pain  -- PT/OT recommend SNF placement, TOC following  HTN Currently not on antihypertensives outpatient -- BP 105/48, continue to monitor  Paroxysmal atrial fibrillation Currently not on any rate controlling medications outpatient -- Eliquis  5 mg p.o. twice daily  CAD HLD -- Atorvastatin  20 mg p.o. daily  COPD/pulmonary fibrosis -- Breo Ellipta  1 puff daily (substituted for home Advair) -- Incruse Ellipta  1 puff daily (substituted for home Spiriva ) -- Continue home Cortef  5mg  PO daily -- Albuterol  neb q2h PRN wheezing/shortness of breath  GERD -- Protonix  40 mg p.o. 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 every 12 hours -- Palliative care consultation for assistance with goals of care/medical decision making given overall prognosis poor   DVT prophylaxis:  apixaban  (ELIQUIS ) tablet 5 mg    Code Status: Full Code Family Communication: No family present at bedside this morning  Disposition Plan:  Level of care: Med-Surg Status is: Inpatient Remains inpatient appropriate because: IV antibiotics, pending SNF placement    Consultants:  Palliative care  Procedures:  None  Antimicrobials:  Cefepime  9/9 -  9/10 Flagyl  9/9 - 9/9 Vancomycin  9/9 -  9/9 Unasyn  9/10>>   Subjective: Patient seen examined bedside, lying in bed.  Alert, pleasantly confused.  No family present at bedside.  Oxygen titrated down to 1 L nasal cannula.  Unable to obtain any further ROS.  No acute events overnight per nursing staff.    Pending SNF placement.  Objective: Vitals:   06/02/24 1954 06/03/24 0553 06/03/24 0737 06/03/24 0831  BP: 133/78 (!) 107/50 (!) 105/48   Pulse: (!) 101 80 83   Resp: 18 17 12    Temp:  97.7 F (36.5 C) (!) 97.5 F (36.4 C)   TempSrc:   Axillary   SpO2: 94% 97% 98% 95%  Weight:        Intake/Output Summary (Last 24 hours) at 06/03/2024 1055 Last data filed at 06/02/2024 1500 Gross per 24 hour  Intake 100 ml  Output --  Net 100 ml   Filed Weights   06/01/24 1810 06/02/24 0700  Weight: (P) 80.1 kg 78.4 kg    Examination:  Physical Exam: GEN: NAD, alert, pleasantly confused and not oriented to person/place/time or situation, chronically ill/elderly in appearance HEENT: NCAT, PERRL, sclera clear, dry mucous membranes PULM: Breath sounds diminished bilateral bases, crackles right base, no wheezing, normal respiratory effort without accessory muscle use; on 1 L nasal cannula with SpO2 95% at rest CV: RRR w/o M/G/R GI: abd soft, NTND, + BS MSK: no peripheral edema Integumentary: No concerning rashes/lesions/wounds noted on exposed skin surfaces    Data Reviewed: I have personally reviewed following labs and imaging studies  CBC: Recent Labs  Lab 06/01/24 1155 06/02/24 0625 06/03/24 0419  WBC 13.7* 13.8* 11.8*  NEUTROABS 11.7*  --   --   HGB 15.1* 11.2* 9.6*  HCT 47.3* 36.9 29.6*  MCV 98.5 97.4 100.3*  PLT 173 161 165   Basic Metabolic Panel: Recent Labs  Lab 06/01/24 1300 06/02/24 0625 06/03/24 0419  NA 143 142 143  K 4.9 4.0 3.9  CL 105 108 110  CO2 25 21* 24  GLUCOSE 98 103* 105*  BUN 20 19 23   CREATININE 0.96 0.93 0.90  CALCIUM  10.1 9.2 8.9  MG  --   --  2.4  PHOS  --   --  3.0    GFR: Estimated Creatinine Clearance: 52.7 mL/min (by C-G formula based on SCr of 0.9 mg/dL). Liver Function Tests: Recent Labs  Lab 06/01/24 1300 06/03/24 0419  AST 57* 27  ALT 14 7  ALKPHOS 100 91  BILITOT 0.8 0.5  PROT 7.8 5.8*  ALBUMIN 4.0 3.0*   Recent Labs  Lab 06/01/24 1300  LIPASE 15   No results for input(s): AMMONIA in the last 168 hours. Coagulation Profile: No results for input(s): INR, PROTIME in the last 168 hours. Cardiac Enzymes: No results for input(s): CKTOTAL, CKMB, CKMBINDEX, TROPONINI in the last 168 hours. BNP (last 3 results) No results for input(s): PROBNP in the last 8760 hours. HbA1C: No results for input(s): HGBA1C in the last 72 hours. CBG: No results for input(s): GLUCAP in the last 168 hours. Lipid Profile: No results for input(s): CHOL, HDL, LDLCALC, TRIG, CHOLHDL, LDLDIRECT in the last 72 hours. Thyroid  Function Tests: No results for input(s): TSH, T4TOTAL, FREET4, T3FREE, THYROIDAB in the last 72 hours. Anemia Panel: No results for input(s): VITAMINB12, FOLATE, FERRITIN, TIBC, IRON , RETICCTPCT in the last 72 hours. Sepsis Labs: Recent Labs  Lab 06/01/24 1300 06/01/24 1450  LATICACIDVEN 1.7 1.7    Recent  Results (from the past 240 hours)  Blood culture (routine x 2)     Status: None (Preliminary result)   Collection Time: 06/01/24  1:11 PM   Specimen: BLOOD LEFT ARM  Result Value Ref Range Status   Specimen Description   Final    BLOOD LEFT ARM Performed at [facility], [address], [place], [place] [address]    Special Requests   Final    BOTTLES DRAWN AEROBIC AND ANAEROBIC Blood Culture results may not be optimal due to an inadequate volume of blood received in culture bottles Performed at [facility], [address], [place], [place] [address]    Culture   Final    NO GROWTH 2 DAYS Performed at [facility], [address]  [place], [place] [address]    Report Status PENDING  Incomplete  Resp panel by RT-PCR (RSV, Flu A&B, Covid)     Status: Abnormal   Collection Time: 06/01/24  1:20 PM  Result Value Ref Range Status   SARS Coronavirus 2 by RT PCR POSITIVE (A) NEGATIVE Final    Comment: (NOTE) SARS-CoV-2 target nucleic acids are DETECTED.  The SARS-CoV-2 RNA is generally detectable in upper respiratory specimens during the acute phase of infection. Positive results are indicative of the presence of the identified virus, but do not rule out bacterial infection or co-infection with other pathogens not detected by the test. Clinical correlation with patient history and other diagnostic information is necessary to determine patient infection status. The expected result is Negative.  Fact Sheet for Patients: BloggerCourse.com  Fact Sheet for Healthcare Providers: SeriousBroker.it  This test is not yet approved or cleared by the [place]  FDA and  has been authorized for detection and/or diagnosis of SARS-CoV-2 by FDA under an Emergency Use Authorization (EUA).  This EUA will remain in effect (meaning this test can be used) for the duration of  the COVID-19 declaration under Section 564(b)(1) of the A ct, 21 U.S.C. section 360bbb-3(b)(1), unless the authorization is terminated or revoked sooner.     Influenza A by PCR NEGATIVE NEGATIVE Final   Influenza B by PCR NEGATIVE NEGATIVE Final    Comment: (NOTE) The Xpert Xpress SARS-CoV-2/FLU/RSV plus assay is intended as an aid in the diagnosis of influenza from Nasopharyngeal swab specimens and should not be used as a sole basis for treatment. Nasal washings and aspirates are unacceptable for Xpert Xpress SARS-CoV-2/FLU/RSV testing.  Fact Sheet for Patients: BloggerCourse.com  Fact Sheet for Healthcare Providers: SeriousBroker.it  This test is not yet  approved or cleared by the [place]  FDA and has been authorized for detection and/or diagnosis of SARS-CoV-2 by FDA under an Emergency Use Authorization (EUA). This EUA will remain in effect (meaning this test can be used) for the duration of the COVID-19 declaration under Section 564(b)(1) of the Act, 21 U.S.C. section 360bbb-3(b)(1), unless the authorization is terminated or revoked.     Resp Syncytial Virus by PCR NEGATIVE NEGATIVE Final    Comment: (NOTE) Fact Sheet for Patients: BloggerCourse.com  Fact Sheet for Healthcare Providers: SeriousBroker.it  This test is not yet approved or cleared by the [place]  FDA and has been authorized for detection and/or diagnosis of SARS-CoV-2 by FDA under an Emergency Use Authorization (EUA). This EUA will remain in effect (meaning this test can be used) for the duration of the COVID-19 declaration under Section 564(b)(1) of the Act, 21 U.S.C. section 360bbb-3(b)(1), unless the authorization is terminated or revoked.  Performed at [facility], [address] [place], [place] [address]   Blood culture (routine x 2)     Status: None (Preliminary result)   Collection Time: 06/01/24  7:26 PM   Specimen: BLOOD RIGHT HAND  Result Value Ref Range Status   Specimen Description   Final    BLOOD RIGHT HAND Performed at [facility], [address] [place], [place] [address]    Special Requests   Final    BOTTLES DRAWN AEROBIC ONLY Blood Culture results may not be optimal due to an inadequate volume of blood received in culture bottles Performed at [facility], [address], [place], [place] [address]    Culture   Final    NO GROWTH 1 DAY Performed at [facility], [address], [place], [place] [address]    Report Status PENDING  Incomplete         Radiology Studies: CT CHEST ABDOMEN PELVIS W CONTRAST Result Date:  06/01/2024 CLINICAL DATA:  Altered mental status.  Generalized abdominal pain. EXAM: CT CHEST, ABDOMEN, AND PELVIS WITH CONTRAST TECHNIQUE: Multidetector CT imaging of the chest, abdomen and pelvis was performed following the standard protocol during bolus administration of intravenous contrast. RADIATION DOSE REDUCTION: This exam was performed according to the departmental dose-optimization program which includes automated exposure control, adjustment of the mA and/or kV according to patient size and/or use of iterative reconstruction technique. CONTRAST:  OMNIPAQUE  IOHEXOL  300 MG/ML  SOLN COMPARISON:  CT dated 03/26/2024. FINDINGS: CT CHEST FINDINGS Cardiovascular: There is no cardiomegaly or pericardial effusion. Mild atherosclerotic calcification of the thoracic aorta. No aneurysmal dilatation or dissection. The origins of the great vessels of the aortic arch and the central pulmonary arteries appear patent. Mediastinum/Nodes: Mildly enlarged subcarinal lymph node measures 11 mm short axis. The esophagus is grossly unremarkable. No mediastinal fluid collection. Lungs/Pleura: There is diffuse interstitial coarsening and subpleural reticulation consistent with pulmonary fibrosis and interstitial lung disease. There is partial occlusion of the right lower lobe bronchus, likely related to mucous impaction or aspiration. An endobronchial lesion is not excluded. Patchy area of increased density at the right lung base may represent scarring/fibrosis or developing infiltrate. Interval resolution of the previously seen nodular density at the left lung base. There is no pleural effusion or pneumothorax. Musculoskeletal: Degenerative changes of the spine and osteopenia. There is fracture of the superior aspect of the T12 vertebra, new since the prior CT, likely acute. CT ABDOMEN PELVIS FINDINGS No intra-abdominal free air or free fluid. Hepatobiliary: The liver is unremarkable. No biliary dilatation. The gallbladder  is unremarkable Pancreas: Unremarkable. No pancreatic ductal dilatation or surrounding inflammatory changes. Spleen: Normal in size without focal abnormality. Adrenals/Urinary Tract: The adrenal glands unremarkable. Bilateral renal cysts as seen previously. Follow-up left renal lesion as per recommendation of prior CT. There is mild bilateral hydronephrosis, right greater than left, and new since the prior CT. No obstructing stone. The urinary bladder is mildly distended and grossly unremarkable. Stomach/Bowel: There is no bowel obstruction. There is mild distal colonic diverticulosis. No active inflammatory changes. Appendectomy. Vascular/Lymphatic: Moderate aortoiliac atherosclerotic disease. 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 The IVC is unremarkable. No portal venous gas. There is no adenopathy. Reproductive: Hysterectomy. Other: None Musculoskeletal: Relatively similar appearance of L1 compression fracture and greater than 50% loss of vertebral body height with fragmentation of the superior endplate. Approximately 3 mm retropulsion of the superior posterior cortex. There is fracture of the  L5 vertebra, new since the prior CT, acute. There is advanced osteopenia. IMPRESSION: 1. Acute fracture of the superior aspect of the T12 vertebra and L5 vertebra. 2. Relatively similar appearance of L1 compression fracture. 3. Partial occlusion of the right lower lobe bronchus, likely related to mucous impaction or aspiration. An endobronchial lesion is not excluded. 4. Patchy area of increased density at the right lung base may represent scarring/fibrosis or developing infiltrate. 5. Mild bilateral hydronephrosis, right greater than left, and new since the prior CT. No obstructing stone. 6.  Aortic Atherosclerosis (ICD10-I70.0). Electronically Signed   By: Vanetta Chou M.D.   On: 06/01/2024 15:40   CT Head Wo Contrast Result Date: 06/01/2024 CLINICAL DATA:  Altered mental status EXAM: CT HEAD  WITHOUT CONTRAST TECHNIQUE: Contiguous axial images were obtained from the base of the skull through the vertex without intravenous contrast. RADIATION DOSE REDUCTION: This exam was performed according to the departmental dose-optimization program which includes automated exposure control, adjustment of the mA and/or kV according to patient size and/or use of iterative reconstruction technique. COMPARISON:  CT brain 03/26/2024 FINDINGS: Brain: No acute territorial infarction, hemorrhage or intracranial mass. Atrophy and mild chronic small vessel ischemic changes of the white matter. Stable ventricle size. Vascular: No hyperdense vessels.  Carotid vascular calcification Skull: Normal. Negative for fracture or focal lesion. Sinuses/Orbits: No acute finding. Other: None IMPRESSION: 1. No CT evidence for acute intracranial abnormality. 2. Atrophy and mild chronic small vessel ischemic changes of the white matter. Electronically Signed   By: Luke Bun M.D.   On: 06/01/2024 15:34   DG Chest Portable 1 View Result Date: 06/01/2024 CLINICAL DATA:  Altered level of consciousness EXAM: PORTABLE CHEST 1 VIEW COMPARISON:  03/26/2024 FINDINGS: Single frontal view of the chest demonstrates an unremarkable cardiac silhouette. Chronic areas of scarring and fibrosis are seen bilaterally within the lungs, right greater than left. No airspace disease, effusion, or pneumothorax. No acute bony abnormalities. IMPRESSION: 1. Chronic scarring and fibrosis.  No acute airspace disease. Electronically Signed   By: Ozell Daring M.D.   On: 06/01/2024 14:57        Scheduled Meds:  apixaban   5 mg Oral BID   atorvastatin   20 mg Oral QHS   fluticasone  furoate-vilanterol  1 puff Inhalation Daily   gabapentin   600 mg Oral QHS   hydrocortisone   5 mg Oral Daily   loratadine   10 mg Oral Daily   LORazepam   0.5 mg Oral Q12H   pantoprazole   40 mg Oral Daily   QUEtiapine   300 mg Oral QHS   senna-docusate  2 tablet Oral BID    umeclidinium bromide   1 puff Inhalation Daily   Continuous Infusions:  sodium chloride  75 mL/hr at 06/03/24 1042   ampicillin -sulbactam (UNASYN ) IV 1.5 g (06/03/24 0626)     LOS: 2 days    Time spent: 48 minutes spent on 06/03/2024 caring for this patient face-to-face including chart review, ordering labs/tests, documenting, discussion with nursing staff, consultants, updating family and interview/physical exam    Camellia PARAS [place], DO [facility] Available via Epic secure chat 7am-7pm After these hours, please refer to coverage provider listed on [email or web address] 06/03/2024, 10:55 AM

## 2024-06-03 NOTE — TOC Progression Note (Addendum)
 Transition of Care [facility]) - Progression Note    Patient Details  Name: Tracy Dickerson MRN: [id number] Date of Birth: 1941/04/23  [facility] of [facility]) CM/SW Contact  Doneta Glenys DASEN, RN Phone Number: 06/03/2024, 10:29 AM  Clinical Narrative:    CM met with Cyrstal and DeeDEE from [facility]. Both are agreeable to SNF. [facility] LG aware.  CM will start SNF work-up. 10:38 AM Referrals sent via HUB. Waiting bed offers, 11:03 AM FL2 completed, [facility] - [phone] [id number]   Expected Discharge Plan: Memory Care [facility]) Barriers to Discharge: Continued Medical Work up               Expected Discharge Plan and Services In-house Referral: NA Discharge Planning Services: CM Consult   Living arrangements for the past 2 months:  (Memory Care)                 DME Arranged: N/A DME Agency: NA                   Social Drivers of Health (SDOH) Interventions SDOH Screenings   Food Insecurity: No Food Insecurity (11/21/2023)  Housing: Low Risk  (11/21/2023)  Transportation Needs: Patient Unable To Answer (06/02/2024)  Utilities: Not At Risk (11/21/2023)  Financial Resource Strain: Low Risk  (02/04/2023)   Received from [facility]  Physical Activity: Inactive (07/16/2021)   Received from [facility]  Social Connections: Patient Unable To Answer (11/21/2023)  Stress: No Stress Concern Present (07/16/2021)   Received from [facility]  Tobacco Use: Medium Risk (03/26/2024)  Health Literacy: Medium Risk (07/16/2021)   Received from [facility]    Readmission Risk Interventions    03/27/2024   11:14 AM  Readmission Risk Prevention Plan  Transportation Screening Complete  Medication Review (RN Care Manager) Complete  PCP or Specialist appointment within 3-5 days of discharge Complete  HRI or Home Care Consult Complete  SW Recovery Care/Counseling Consult Complete  Palliative Care Screening Not Applicable  Skilled Nursing Facility Not Applicable

## 2024-06-04 ENCOUNTER — Encounter (HOSPITAL_COMMUNITY): Payer: Self-pay | Admitting: Internal Medicine

## 2024-06-04 DIAGNOSIS — F03C0 Unspecified dementia, severe, without behavioral disturbance, psychotic disturbance, mood disturbance, and anxiety: Secondary | ICD-10-CM

## 2024-06-04 DIAGNOSIS — Z7189 Other specified counseling: Secondary | ICD-10-CM

## 2024-06-04 DIAGNOSIS — J189 Pneumonia, unspecified organism: Secondary | ICD-10-CM | POA: Diagnosis not present

## 2024-06-04 DIAGNOSIS — Z66 Do not resuscitate: Secondary | ICD-10-CM

## 2024-06-04 DIAGNOSIS — U071 COVID-19: Secondary | ICD-10-CM | POA: Diagnosis not present

## 2024-06-04 DIAGNOSIS — Z515 Encounter for palliative care: Secondary | ICD-10-CM | POA: Diagnosis not present

## 2024-06-04 LAB — BLOOD CULTURE ID PANEL (REFLEXED) - BCID2

## 2024-06-04 LAB — CBC
HCT: 31 % — ABNORMAL LOW (ref 36.0–46.0)
Hemoglobin: 9.8 g/dL — ABNORMAL LOW (ref 12.0–15.0)
MCH: 31.7 pg (ref 26.0–34.0)
MCHC: 31.6 g/dL (ref 30.0–36.0)
MCV: 100.3 fL — ABNORMAL HIGH (ref 80.0–100.0)
Platelets: 168 K/uL (ref 150–400)
RBC: 3.09 MIL/uL — ABNORMAL LOW (ref 3.87–5.11)
RDW: 13.7 % (ref 11.5–15.5)
WBC: 7.6 K/uL (ref 4.0–10.5)
nRBC: 0 % (ref 0.0–0.2)

## 2024-06-04 MED ORDER — POLYETHYLENE GLYCOL 3350 17 G PO PACK
17.0000 g | PACK | Freq: Every day | ORAL | Status: DC | PRN
  Filled 2024-06-04: qty 1

## 2024-06-04 NOTE — Progress Notes (Signed)
 PHARMACY - PHYSICIAN COMMUNICATION CRITICAL VALUE ALERT - BLOOD CULTURE IDENTIFICATION (BCID)  Tracy Dickerson is an 83 y.o. female who presented to [facility] on 06/01/2024 with confusion.   Assessment: Pt currently on antibiotics for aspiration PNA. -WBC WNL, afebrile, oxygen weaned off  BCID + 1/3 gram variable rods, no organism identified on BCID panel.   Name of physician (or Provider) Contacted: Dr. [place]  Current antibiotics: ampicillin gabino  Changes to prescribed antibiotics recommended: None. Likely contaminant.   Results for orders placed or performed during the hospital encounter of 06/01/24  Blood Culture ID Panel (Reflexed) (Collected: 06/01/2024  1:11 PM)  Result Value Ref Range   Enterococcus faecalis NOT DETECTED NOT DETECTED   Enterococcus Faecium NOT DETECTED NOT DETECTED   Listeria monocytogenes NOT DETECTED NOT DETECTED   Staphylococcus species NOT DETECTED NOT DETECTED   Staphylococcus aureus (BCID) NOT DETECTED NOT DETECTED   Staphylococcus epidermidis NOT DETECTED NOT DETECTED   Staphylococcus lugdunensis NOT DETECTED NOT DETECTED   Streptococcus species NOT DETECTED NOT DETECTED   Streptococcus agalactiae NOT DETECTED NOT DETECTED   Streptococcus pneumoniae NOT DETECTED NOT DETECTED   Streptococcus pyogenes NOT DETECTED NOT DETECTED   A.calcoaceticus-baumannii NOT DETECTED NOT DETECTED   Bacteroides fragilis NOT DETECTED NOT DETECTED   Enterobacterales NOT DETECTED NOT DETECTED   Enterobacter cloacae complex NOT DETECTED NOT DETECTED   Escherichia coli NOT DETECTED NOT DETECTED   Klebsiella aerogenes NOT DETECTED NOT DETECTED   Klebsiella oxytoca NOT DETECTED NOT DETECTED   Klebsiella pneumoniae NOT DETECTED NOT DETECTED   Proteus species NOT DETECTED NOT DETECTED   Salmonella species NOT DETECTED NOT DETECTED   Serratia marcescens NOT DETECTED NOT DETECTED   Haemophilus influenzae NOT DETECTED NOT DETECTED   Neisseria meningitidis NOT DETECTED NOT  DETECTED   Pseudomonas aeruginosa NOT DETECTED NOT DETECTED   Stenotrophomonas maltophilia NOT DETECTED NOT DETECTED   Candida albicans NOT DETECTED NOT DETECTED   Candida auris NOT DETECTED NOT DETECTED   Candida glabrata NOT DETECTED NOT DETECTED   Candida krusei NOT DETECTED NOT DETECTED   Candida parapsilosis NOT DETECTED NOT DETECTED   Candida tropicalis NOT DETECTED NOT DETECTED   Cryptococcus neoformans/gattii NOT DETECTED NOT DETECTED    Ronal CHRISTELLA Rav, PharmD 06/04/2024  3:18 PM

## 2024-06-04 NOTE — Progress Notes (Addendum)
 PROGRESS NOTE    Tracy Dickerson  FMW:[id number] DOB: 03-01-1941 DOA: 06/01/2024 PCP: Default, Provider, MD    Brief Narrative:   Tracy Dickerson is a 83 y.o. female with past medical history significant for HTN, HLD, CAD, paroxysmal atrial fibrillation on Eliquis , COPD/pulmonary fibrosis, dementia, GERD who presented to [facility] ED on 06/01/2024 via EMS from [facility] complaining of confusion and back pain.  Patient unable to provide any history due to her dementia.  Recently admitted July 2025 with pneumonia.  In the ED, temperature 97.5 F, HR 103, RR 16, BP 125/71, SpO2 97% on room air.  WBC 13.7, hemoglobin 15.1, platelet count 173.  Sodium 143, potassium 4.9, chloride 105, CO2 25, glucose 98, BUN 20, creatinine 0.96.  AST 57, ALT 14, total bilirubin 0.7.  Lactic acid 1.7.  COVID PCR positive.  RSV/influenza A/B PCR negative.  CT head without contrast with no evidence for acute intracranial malady, noted atrophy and mild chronic small vessel ischemic changes of the white matter.  CT chest/abdomen/pelvis with acute fracture superior aspect T12 vertebrae and L5 vertebrae, similar appearance L1 compression fracture, partial occlusion in the right lower lobe bronchus likely related to mucus impaction versus aspiration, although endobronchial lesion not excluded, patchy areas of increased density right lung base scarring/fibrosis versus developing infiltrate, mild bilateral hydronephrosis, right greater than left without obstructing stone.  Patient was started on IV antibiotics.  TRH consulted for admission for further evaluation and management of confusion, aspiration pneumonia.  Assessment & Plan:   Sepsis, POA Aspiration pneumonia Patient presenting with confusion; in the setting of advanced dementia.  Patient afebrile with elevated WBC count of 13.7.  CT chest with findings of partial occlusion right lower lobe bronchus secondary to mucus impaction versus aspiration; although endobronchial  lesion not excluded; additionally patchy areas of increased density right lung base concerning for developing infiltrate. -- WBC 13.7>13.8>11.8>7.6 -- Unasyn  1.5 g IV every 6 hours -- Continue supplemental oxygen, goal SpO2 > 88%; oxygen now weaned off  COVID viral infection COVID-19 PCR positive.  Imaging findings not consistent with viral pneumonia. -- Continue airborne/contact isolation precautions -- Supportive care  Spinal compression fractures CT chest/abdomen/pelvis with acute findings of T12 and L5 compression fractures, known history of L1 compression fracture.  Patient currently resides at [facility]. --Tramadol  50 mg p.o. every 6 hours as needed moderate pain  -- PT/OT recommend SNF placement, TOC following  HTN Currently not on antihypertensives outpatient -- BP 152/69 this a.m., continue to monitor  Paroxysmal atrial fibrillation Currently not on any rate controlling medications outpatient -- Eliquis  5 mg p.o. twice daily  CAD HLD -- Atorvastatin  20 mg p.o. daily  COPD/pulmonary fibrosis -- Breo Ellipta  1 puff daily (substituted for home Advair) -- Incruse Ellipta  1 puff daily (substituted for home Spiriva ) -- Continue home Cortef  5mg  PO daily -- Albuterol  neb q2h PRN wheezing/shortness of breath  GERD -- Protonix  40 mg p.o. 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 every 12 hours -- Palliative care following for assistance with goals of care/medical decision making given overall prognosis poor; would benefit from pelvic care to follow at SNF versus transitioning to hospice level of care in the outpatient setting   DVT prophylaxis:  apixaban  (ELIQUIS ) tablet 5 mg    Code Status: Full Code Family Communication: No family present at bedside this morning  Disposition Plan:  Level of care: Med-Surg  Status is: Inpatient Remains inpatient appropriate because: IV  antibiotics, pending SNF placement    Consultants:  Palliative care  Procedures:  None  Antimicrobials:  Cefepime  9/9 - 9/10 Flagyl  9/9 - 9/9 Vancomycin  9/9 - 9/9 Unasyn  9/10>>   Subjective: Patient seen examined bedside, lying in bed.  Alert, pleasantly confused.  No family present at bedside.  Oxygen now titrated off.  Remains pleasantly confused, unable to obtain any further ROS.  No acute events overnight per nursing staff.    Pending SNF placement.  Objective: Vitals:   06/03/24 0831 06/03/24 1159 06/03/24 1950 06/04/24 0428  BP:   (!) 141/71 (!) 152/69  Pulse:   94 86  Resp:   18 18  Temp:   97.7 F (36.5 C) 97.6 F (36.4 C)  TempSrc:   Axillary Axillary  SpO2: 95% 94%  92%  Weight:        Intake/Output Summary (Last 24 hours) at 06/04/2024 1151 Last data filed at 06/04/2024 1100 Gross per 24 hour  Intake 1188.75 ml  Output 2050 ml  Net -861.25 ml   Filed Weights   06/01/24 1810 06/02/24 0700  Weight: (P) 80.1 kg 78.4 kg    Examination:  Physical Exam: GEN: NAD, alert, pleasantly confused and not oriented to person/place/time or situation, chronically ill/elderly in appearance HEENT: NCAT, PERRL, sclera clear, dry mucous membranes PULM: Breath sounds diminished bilateral bases, crackles right base, no wheezing, normal respiratory effort without accessory muscle use; on room air with SpO2 92% at rest CV: RRR w/o M/G/R GI: abd soft, NTND, + BS MSK: no peripheral edema Integumentary: No concerning rashes/lesions/wounds noted on exposed skin surfaces    Data Reviewed: I have personally reviewed following labs and imaging studies  CBC: Recent Labs  Lab 06/01/24 1155 06/02/24 0625 06/03/24 0419 06/04/24 0543  WBC 13.7* 13.8* 11.8* 7.6  NEUTROABS 11.7*  --   --   --   HGB 15.1* 11.2* 9.6* 9.8*  HCT 47.3* 36.9 29.6* 31.0*  MCV 98.5 97.4 100.3* 100.3*  PLT 173 161 165 168   Basic Metabolic Panel: Recent Labs  Lab 06/01/24 1300 06/02/24 0625  06/03/24 0419  NA 143 142 143  K 4.9 4.0 3.9  CL 105 108 110  CO2 25 21* 24  GLUCOSE 98 103* 105*  BUN 20 19 23   CREATININE 0.96 0.93 0.90  CALCIUM  10.1 9.2 8.9  MG  --   --  2.4  PHOS  --   --  3.0   GFR: Estimated Creatinine Clearance: 52.7 mL/min (by C-G formula based on SCr of 0.9 mg/dL). Liver Function Tests: Recent Labs  Lab 06/01/24 1300 06/03/24 0419  AST 57* 27  ALT 14 7  ALKPHOS 100 91  BILITOT 0.8 0.5  PROT 7.8 5.8*  ALBUMIN 4.0 3.0*   Recent Labs  Lab 06/01/24 1300  LIPASE 15   No results for input(s): AMMONIA in the last 168 hours. Coagulation Profile: No results for input(s): INR, PROTIME in the last 168 hours. Cardiac Enzymes: No results for input(s): CKTOTAL, CKMB, CKMBINDEX, TROPONINI in the last 168 hours. BNP (last 3 results) No results for input(s): PROBNP in the last 8760 hours. HbA1C: No results for input(s): HGBA1C in the last 72 hours. CBG: No results for input(s): GLUCAP in the last 168 hours. Lipid Profile: No results for input(s): CHOL, HDL, LDLCALC, TRIG, CHOLHDL, LDLDIRECT in the last 72 hours. Thyroid  Function Tests: No results for input(s): TSH, T4TOTAL, FREET4, T3FREE, THYROIDAB in the last 72 hours.  Anemia Panel: No results for input(s): VITAMINB12, FOLATE, FERRITIN, TIBC, IRON , RETICCTPCT in the last 72 hours. Sepsis Labs: Recent Labs  Lab 06/01/24 1300 06/01/24 1450  LATICACIDVEN 1.7 1.7    Recent Results (from the past 240 hours)  Blood culture (routine x 2)     Status: None (Preliminary result)   Collection Time: 06/01/24  1:11 PM   Specimen: BLOOD LEFT ARM  Result Value Ref Range Status   Specimen Description   Final    BLOOD LEFT ARM Performed at [facility], [address], [place], [place] [address]    Special Requests   Final    BOTTLES DRAWN AEROBIC AND ANAEROBIC Blood Culture results may not be optimal due to an inadequate volume of blood received  in culture bottles Performed at [facility], [address] [place], [place] [address]    Culture   Final    NO GROWTH 3 DAYS Performed at [facility], [address], [place], [place] [address]    Report Status PENDING  Incomplete  Resp panel by RT-PCR (RSV, Flu A&B, Covid)     Status: Abnormal   Collection Time: 06/01/24  1:20 PM  Result Value Ref Range Status   SARS Coronavirus 2 by RT PCR POSITIVE (A) NEGATIVE Final    Comment: (NOTE) SARS-CoV-2 target nucleic acids are DETECTED.  The SARS-CoV-2 RNA is generally detectable in upper respiratory specimens during the acute phase of infection. Positive results are indicative of the presence of the identified virus, but do not rule out bacterial infection or co-infection with other pathogens not detected by the test. Clinical correlation with patient history and other diagnostic information is necessary to determine patient infection status. The expected result is Negative.  Fact Sheet for Patients: BloggerCourse.com  Fact Sheet for Healthcare Providers: SeriousBroker.it  This test is not yet approved or cleared by the [place]  FDA and  has been authorized for detection and/or diagnosis of SARS-CoV-2 by FDA under an Emergency Use Authorization (EUA).  This EUA will remain in effect (meaning this test can be used) for the duration of  the COVID-19 declaration under Section 564(b)(1) of the A ct, 21 U.S.C. section 360bbb-3(b)(1), unless the authorization is terminated or revoked sooner.     Influenza A by PCR NEGATIVE NEGATIVE Final   Influenza B by PCR NEGATIVE NEGATIVE Final    Comment: (NOTE) The Xpert Xpress SARS-CoV-2/FLU/RSV plus assay is intended as an aid in the diagnosis of influenza from Nasopharyngeal swab specimens and should not be used as a sole basis for treatment. Nasal washings and aspirates are unacceptable for Xpert Xpress  SARS-CoV-2/FLU/RSV testing.  Fact Sheet for Patients: BloggerCourse.com  Fact Sheet for Healthcare Providers: SeriousBroker.it  This test is not yet approved or cleared by the [place]  FDA and has been authorized for detection and/or diagnosis of SARS-CoV-2 by FDA under an Emergency Use Authorization (EUA). This EUA will remain in effect (meaning this test can be used) for the duration of the COVID-19 declaration under Section 564(b)(1) of the Act, 21 U.S.C. section 360bbb-3(b)(1), unless the authorization is terminated or revoked.     Resp Syncytial Virus by PCR NEGATIVE NEGATIVE Final    Comment: (NOTE) Fact Sheet for Patients: BloggerCourse.com  Fact Sheet for Healthcare Providers: SeriousBroker.it  This test is not yet approved or cleared by the [place]  FDA and has been authorized for detection and/or diagnosis of SARS-CoV-2 by FDA under an Emergency Use Authorization (EUA). This EUA  will remain in effect (meaning this test can be used) for the duration of the COVID-19 declaration under Section 564(b)(1) of the Act, 21 U.S.C. section 360bbb-3(b)(1), unless the authorization is terminated or revoked.  Performed at [facility], [address], [place], [place] [address]   Blood culture (routine x 2)     Status: None (Preliminary result)   Collection Time: 06/01/24  7:26 PM   Specimen: BLOOD RIGHT HAND  Result Value Ref Range Status   Specimen Description   Final    BLOOD RIGHT HAND Performed at [facility], [address], [place], [place] [address]    Special Requests   Final    BOTTLES DRAWN AEROBIC ONLY Blood Culture results may not be optimal due to an inadequate volume of blood received in culture bottles Performed at [facility], [address] [place], [place] [address]    Culture   Final    NO GROWTH 2  DAYS Performed at [facility], [address], [place], [place] [address]    Report Status PENDING  Incomplete         Radiology Studies: No results found.       Scheduled Meds:  apixaban   5 mg Oral BID   atorvastatin   20 mg Oral QHS   fluticasone  furoate-vilanterol  1 puff Inhalation Daily   gabapentin   600 mg Oral QHS   hydrocortisone   5 mg Oral Daily   loratadine   10 mg Oral Daily   LORazepam   0.5 mg Oral Q12H   pantoprazole   40 mg Oral Daily   QUEtiapine   300 mg Oral QHS   senna-docusate  2 tablet Oral BID   umeclidinium bromide   1 puff Inhalation Daily   Continuous Infusions:  sodium chloride  75 mL/hr at 06/03/24 2342   ampicillin -sulbactam (UNASYN ) IV 1.5 g (06/04/24 0605)     LOS: 3 days    Time spent: 48 minutes spent on 06/04/2024 caring for this patient face-to-face including chart review, ordering labs/tests, documenting, discussion with nursing staff, consultants, updating family and interview/physical exam    Camellia PARAS [place], DO [facility] Available via Epic secure chat 7am-7pm After these hours, please refer to coverage provider listed on [email or web address] 06/04/2024, 11:51 AM

## 2024-06-04 NOTE — Plan of Care (Signed)
  Problem: Nutrition: Goal: Adequate nutrition will be maintained Outcome: Progressing   Problem: Safety: Goal: Ability to remain free from injury will improve Outcome: Progressing   

## 2024-06-04 NOTE — TOC Progression Note (Addendum)
 Transition of Care [facility]) - Progression Note    Patient Details  Name: Tracy Dickerson MRN: [id number] Date of Birth: [birth year]  Transition of Care [facility]) CM/SW Contact  Doneta Glenys DASEN, RN Phone Number: 06/04/2024, 4:24 PM  Clinical Narrative:    Waiting to file for auth. Patient is confused, can't follow commands and COVID positive. Choice was [name] SNF CM spoke with Crystal at [facility]   Expected Discharge Plan: Memory Care [facility] Memory Care) Barriers to Discharge: Continued Medical Work up               Expected Discharge Plan and Services In-house Referral: NA Discharge Planning Services: CM Consult   Living arrangements for the past 2 months:  (Memory Care)                 DME Arranged: N/A DME Agency: NA                   Social Drivers of Health (SDOH) Interventions SDOH Screenings   Food Insecurity: No Food Insecurity (11/21/2023)  Housing: Low Risk  (11/21/2023)  Transportation Needs: Patient Unable To Answer (06/02/2024)  Utilities: Not At Risk (11/21/2023)  Financial Resource Strain: Low Risk  (02/04/2023)   Received from [facility]  Physical Activity: Inactive (07/16/2021)   Received from [facility]  Social Connections: Patient Unable To Answer (11/21/2023)  Stress: No Stress Concern Present (07/16/2021)   Received from [facility]  Tobacco Use: Medium Risk (06/04/2024)  Health Literacy: Medium Risk (07/16/2021)   Received from [facility]    Readmission Risk Interventions    03/27/2024   11:14 AM  Readmission Risk Prevention Plan  Transportation Screening Complete  Medication Review (RN Care Manager) Complete  PCP or Specialist appointment within 3-5 days of discharge Complete  HRI or Home Care Consult Complete  SW Recovery Care/Counseling Consult Complete  Palliative Care Screening Not Applicable  Skilled Nursing Facility Not Applicable

## 2024-06-04 NOTE — Plan of Care (Signed)

## 2024-06-04 NOTE — TOC Progression Note (Signed)
 Transition of Care [facility]) - Progression Note    Patient Details  Name: Tracy Dickerson MRN: [id number] Date of Birth: [birth year]  Transition of Care [facility]) CM/SW Contact  Doneta Glenys DASEN, RN Phone Number: 06/04/2024, 7:43 AM  Clinical Narrative:    CM called [facility] [phone] sent text with list SNF that accepted patient. CM will wait on choice.    Expected Discharge Plan: Memory Care [facility] Memory Care) Barriers to Discharge: Continued Medical Work up               Expected Discharge Plan and Services In-house Referral: NA Discharge Planning Services: CM Consult   Living arrangements for the past 2 months:  (Memory Care)                 DME Arranged: N/A DME Agency: NA                   Social Drivers of Health (SDOH) Interventions SDOH Screenings   Food Insecurity: No Food Insecurity (11/21/2023)  Housing: Low Risk  (11/21/2023)  Transportation Needs: Patient Unable To Answer (06/02/2024)  Utilities: Not At Risk (11/21/2023)  Financial Resource Strain: Low Risk  (02/04/2023)   Received from [facility]  Physical Activity: Inactive (07/16/2021)   [facility]  Social Connections: Patient Unable To Answer (11/21/2023)  Stress: No Stress Concern Present (07/16/2021)   Received from [facility]  Tobacco Use: Medium Risk (03/26/2024)  Health Literacy: Medium Risk (07/16/2021)   Received from [facility]    Readmission Risk Interventions    03/27/2024   11:14 AM  Readmission Risk Prevention Plan  Transportation Screening Complete  Medication Review (RN Care Manager) Complete  PCP or Specialist appointment within 3-5 days of discharge Complete  HRI or Home Care Consult Complete  SW Recovery Care/Counseling Consult Complete  Palliative Care Screening Not Applicable  Skilled Nursing Facility Not Applicable

## 2024-06-04 NOTE — Progress Notes (Signed)
 Daily Progress Note   Patient Name: Tracy Dickerson       Date: 06/04/2024 DOB: [birth year]  Age: 83 y.o. MRN#: [id number] Attending Physician: [place], Eric J, DO Primary Care Physician: Default, Provider, MD Admit Date: 06/01/2024 Length of Stay: 3 days  Reason for Consultation/Follow-up: Establishing goals of care  Subjective:   Reviewed EMR including recent documentation from hospitalist and TOC. Discussed care with RN for medical updates.  This morning patient noted to have issues with urinary retention and required In-N-Out cath.  When presenting to bedside, patient laying in bed and remains pleasantly confused.  No visitors present at bedside.  Able to call patient's legal guardian, Tracy Dickerson, as listed in EMR.  Introduced myself as a member of the palliative medicine team.  Spent time providing updates regarding patient's medical care.  Again expressed concerns with patient's multiple underlying medical conditions and the likelihood of frequent readmissions due to complications from medical conditions such as aspiration pneumonia in setting of dementia.   Tracy did receive the letter that had been sent by palliative provider and hospitalist regarding recommendations.  Agreeing with supporting change of CODE STATUS to DNR/DNI at this time.  Tracy noted that as guardian would want to proceed with patient getting discharged to rehab at this time.  Does not want to transition to comfort care or refer patient to hospice.  Plan is for patient to return to her facility after completion of rehab.  Acknowledged this and recommended further consideration of hospice involvement in the future.  All questions answered at that time.  Noted palliative medicine team to continue to follow along with patient's medical journey.  Discussed care with hospitalist, RN, TOC, and therapies to coordinate care.  Objective:   Vital Signs:  BP (!) 152/69 (BP Location: Right Arm)   Pulse 86   Temp 97.6 F (36.4  C) (Axillary)   Resp 18   Wt 78.4 kg   SpO2 92%   BMI 27.90 kg/m   Physical Exam: General: Confused, chronically ill-appearing Cardiovascular: RR Respiratory: no increased work of breathing noted, not in respiratory distress Neuro: Confused  Assessment & Plan:   Assessment: Patient is an 83 year old female with advanced dementia living at [facility], COPD/pulmonary fibrosis, and history of aspiration pneumonia who was admitted on 06/01/2024 to assist with generalized weakness and altered mental status.  Patient found to have COVID infection along with recurrent aspiration and new lumbar compression fractures.  Palliative medicine team consulted to assist with complex medical decision making.  Recommendations/Plan: # Complex medical decision making/goals of care:  - Patient unable to participate in complex medical decision making due to underlying medical status.  - Discussed care with patient's legal guardian, Tracy Dickerson, over the phone as detailed above in HPI.  Guardian had been provided letter by palliative provider and hospitalist regarding recommendations to change CODE STATUS and consider comfort focused care if deteriorated and possible hospice support.  At this time guardian agreeing with change of CODE STATUS to DNR/DNI.  Guardian would want patient to go to rehab and then return to her long-term care facility.  Recommended guardian consider involvement of hospice in the future as concerned that patient has underlying dementia which will continue to worsen and likely lead to recurrent episodes of aspiration pneumonia.  Palliative medicine team continuing to follow along to engage in conversations as able and appropriate.  -  Code Status: Limited: Do not attempt resuscitation (DNR) -DNR-LIMITED -Do Not Intubate/DNI    - CODE STATUS  appropriately changed from full code to DNR/DNI after discussion with legal guardian today.  # Psychosocial Support:  - Legal guardian-  Tracy Dickerson  # Discharge Planning: Skilled Nursing Facility for rehab with Palliative care service follow-up  - Placed referral to [facility] for outpatient palliative referral  Discussed with: Hospitalist, RN, [facility], legal guardian  Thank you for allowing the palliative care team to participate in the care Tracy Dickerson.  Tinnie Radar, DO Palliative Care Provider PMT # [phone]  If patient remains symptomatic despite maximum doses, please call PMT at [phone] between 0700 and 1900. Outside of these hours, please call attending, as PMT does not have night coverage.  Billing based on MDM: High  Problems Addressed: One or more chronic illnesses with severe exacerbation, progression, or side effects of treatment.  Risks: Decision not to resuscitate or to de-escalate care because of poor prognosis

## 2024-06-05 DIAGNOSIS — J189 Pneumonia, unspecified organism: Secondary | ICD-10-CM | POA: Diagnosis not present

## 2024-06-05 MED ORDER — HALOPERIDOL LACTATE 5 MG/ML IJ SOLN: 2.0000 mg | Freq: Four times a day (QID) | INTRAMUSCULAR | Status: DC | PRN

## 2024-06-05 MED ORDER — CHLORHEXIDINE GLUCONATE CLOTH 2 % EX PADS
6.0000 | MEDICATED_PAD | Freq: Every day | CUTANEOUS | Status: DC
  Administered 2024-06-05: 6 via TOPICAL

## 2024-06-05 MED ORDER — HALOPERIDOL LACTATE 5 MG/ML IJ SOLN
2.0000 mg | Freq: Four times a day (QID) | INTRAMUSCULAR | Status: DC | PRN
  Administered 2024-06-05: 2 mg via INTRAVENOUS
  Filled 2024-06-05 (×2): qty 1

## 2024-06-05 NOTE — Progress Notes (Signed)
 Pt refusing bladder scan, linen has spilled chocolate pudding and gown, pt refusing to clean up and linen change, pt yelling at staff I dont give a got damn leave me alone provided education and emotional support, pt refused. Report will be given to oncoming nurse, care ongoing.

## 2024-06-05 NOTE — Progress Notes (Signed)
 PROGRESS NOTE    Tracy Dickerson  FMW:[id number] DOB: 1940/10/22 DOA: 06/01/2024 PCP: Default, Provider, MD    Brief Narrative:   Tracy Dickerson is a 83 y.o. female with past medical history significant for HTN, HLD, CAD, paroxysmal atrial fibrillation on Eliquis , COPD/pulmonary fibrosis, dementia, GERD who presented to [facility] ED on 06/01/2024 via EMS from [facility] complaining of confusion and back pain.  Patient unable to provide any history due to her dementia.  Recently admitted July 2025 with pneumonia.  In the ED, temperature 97.5 F, HR 103, RR 16, BP 125/71, SpO2 97% on room air.  WBC 13.7, hemoglobin 15.1, platelet count 173.  Sodium 143, potassium 4.9, chloride 105, CO2 25, glucose 98, BUN 20, creatinine 0.96.  AST 57, ALT 14, total bilirubin 0.7.  Lactic acid 1.7.  COVID PCR positive.  RSV/influenza A/B PCR negative.  CT head without contrast with no evidence for acute intracranial malady, noted atrophy and mild chronic small vessel ischemic changes of the white matter.  CT chest/abdomen/pelvis with acute fracture superior aspect T12 vertebrae and L5 vertebrae, similar appearance L1 compression fracture, partial occlusion in the right lower lobe bronchus likely related to mucus impaction versus aspiration, although endobronchial lesion not excluded, patchy areas of increased density right lung base scarring/fibrosis versus developing infiltrate, mild bilateral hydronephrosis, right greater than left without obstructing stone.  Patient was started on IV antibiotics.  TRH consulted for admission for further evaluation and management of confusion, aspiration pneumonia.  Assessment & Plan:   Sepsis, POA Aspiration pneumonia Patient presenting with confusion; in the setting of advanced dementia.  Patient afebrile with elevated WBC count of 13.7.  CT chest with findings of partial occlusion right lower lobe bronchus secondary to mucus impaction versus aspiration; although endobronchial  lesion not excluded; additionally patchy areas of increased density right lung base concerning for developing infiltrate. -- WBC 13.7>13.8>11.8>7.6 -- Unasyn  1.5 g IV every 6 hours -- Continue supplemental oxygen, goal SpO2 > 88%; oxygen now weaned off  COVID viral infection COVID-19 PCR positive.  Imaging findings not consistent with viral pneumonia. -- Continue airborne/contact isolation precautions -- Supportive care  Spinal compression fractures CT chest/abdomen/pelvis with acute findings of T12 and L5 compression fractures, known history of L1 compression fracture.  Patient currently resides at [facility]. --Tramadol  50 mg p.o. every 6 hours as needed moderate pain  -- PT/OT recommend SNF placement, TOC following  HTN Currently not on antihypertensives outpatient -- BP 117/67 this a.m., continue to monitor  Paroxysmal atrial fibrillation Currently not on any rate controlling medications outpatient -- Eliquis  5 mg p.o. twice daily  CAD HLD -- Atorvastatin  20 mg p.o. daily  COPD/pulmonary fibrosis -- Breo Ellipta  1 puff daily (substituted for home Advair) -- Incruse Ellipta  1 puff daily (substituted for home Spiriva ) -- Continue home Cortef  5mg  PO daily -- Albuterol  neb q2h PRN wheezing/shortness of breath  GERD -- Protonix  40 mg p.o. 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 every 12 hours -- Palliative care following for assistance with goals of care/medical decision making given overall prognosis poor as patient refusing to eat and take her medications; would benefit from transitioning to hospice level of care in the outpatient setting   DVT prophylaxis:  apixaban  (ELIQUIS ) tablet 5 mg    Code Status: Limited: Do not attempt resuscitation (DNR) -DNR-LIMITED -Do Not Intubate/DNI  Family Communication: No family present at  bedside this morning  Disposition Plan:   Level of care: Med-Surg Status is: Inpatient Remains inpatient appropriate because: IV antibiotics, pending SNF placement; prognosis poor/guarded    Consultants:  Palliative care  Procedures:  None  Antimicrobials:  Cefepime  9/9 - 9/10 Flagyl  9/9 - 9/9 Vancomycin  9/9 - 9/9 Unasyn  9/10>>   Subjective: Patient seen examined bedside, lying in bed.  Will awaken to command, continues to refuse to eat or take her medications.  No family present.  Palliative care discussed with patient's guardian, okay for DNR but would like to have her discharged to SNF with palliative care to follow.  Would benefit from transitioning to hospice given her continued decline.  Unable to obtain any further ROS.  No acute events overnight per nursing staff.    Pending SNF placement.  Objective: Vitals:   06/03/24 1950 06/04/24 0428 06/04/24 2335 06/05/24 0528  BP: (!) 141/71 (!) 152/69 (!) 152/81 117/67  Pulse: 94 86 (!) 108 77  Resp: 18 18 18 20   Temp: 97.7 F (36.5 C) 97.6 F (36.4 C) 97.9 F (36.6 C) 98.4 F (36.9 C)  TempSrc: Axillary Axillary    SpO2:  92%  95%  Weight:        Intake/Output Summary (Last 24 hours) at 06/05/2024 1045 Last data filed at 06/05/2024 0500 Gross per 24 hour  Intake 200 ml  Output 500 ml  Net -300 ml   Filed Weights   06/01/24 1810 06/02/24 0700  Weight: (P) 80.1 kg 78.4 kg    Examination:  Physical Exam: GEN: NAD, alert, pleasantly confused and not oriented to person/place/time or situation, chronically ill/elderly in appearance HEENT: NCAT, PERRL, sclera clear, dry mucous membranes PULM: Breath sounds diminished bilateral bases, crackles right base, no wheezing, normal respiratory effort without accessory muscle use; on room air with SpO2 95% at rest CV: RRR w/o M/G/R GI: abd soft, NTND, + BS MSK: no peripheral edema Integumentary: No concerning rashes/lesions/wounds noted on exposed skin surfaces    Data Reviewed: I have personally reviewed  following labs and imaging studies  CBC: Recent Labs  Lab 06/01/24 1155 06/02/24 0625 06/03/24 0419 06/04/24 0543  WBC 13.7* 13.8* 11.8* 7.6  NEUTROABS 11.7*  --   --   --   HGB 15.1* 11.2* 9.6* 9.8*  HCT 47.3* 36.9 29.6* 31.0*  MCV 98.5 97.4 100.3* 100.3*  PLT 173 161 165 168   Basic Metabolic Panel: Recent Labs  Lab 06/01/24 1300 06/02/24 0625 06/03/24 0419  NA 143 142 143  K 4.9 4.0 3.9  CL 105 108 110  CO2 25 21* 24  GLUCOSE 98 103* 105*  BUN 20 19 23   CREATININE 0.96 0.93 0.90  CALCIUM  10.1 9.2 8.9  MG  --   --  2.4  PHOS  --   --  3.0   GFR: Estimated Creatinine Clearance: 52.7 mL/min (by C-G formula based on SCr of 0.9 mg/dL). Liver Function Tests: Recent Labs  Lab 06/01/24 1300 06/03/24 0419  AST 57* 27  ALT 14 7  ALKPHOS 100 91  BILITOT 0.8 0.5  PROT 7.8 5.8*  ALBUMIN 4.0 3.0*   Recent Labs  Lab 06/01/24 1300  LIPASE 15   No results for input(s): AMMONIA in the last 168 hours. Coagulation Profile: No results for input(s): INR, PROTIME in the last 168 hours. Cardiac Enzymes: No results for input(s): CKTOTAL, CKMB, CKMBINDEX, TROPONINI in the last 168 hours. BNP (last 3 results) No results for input(s): PROBNP in the last 8760 hours.  HbA1C: No results for input(s): HGBA1C in the last 72 hours. CBG: No results for input(s): GLUCAP in the last 168 hours. Lipid Profile: No results for input(s): CHOL, HDL, LDLCALC, TRIG, CHOLHDL, LDLDIRECT in the last 72 hours. Thyroid  Function Tests: No results for input(s): TSH, T4TOTAL, FREET4, T3FREE, THYROIDAB in the last 72 hours. Anemia Panel: No results for input(s): VITAMINB12, FOLATE, FERRITIN, TIBC, IRON , RETICCTPCT in the last 72 hours. Sepsis Labs: Recent Labs  Lab 06/01/24 1300 06/01/24 1450  LATICACIDVEN 1.7 1.7    Recent Results (from the past 240 hours)  Blood culture (routine x 2)     Status: Abnormal (Preliminary result)    Collection Time: 06/01/24  1:11 PM   Specimen: BLOOD LEFT ARM  Result Value Ref Range Status   Specimen Description   Final    BLOOD LEFT ARM Performed at [facility], [address], [place], [place] [address]    Special Requests   Final    BOTTLES DRAWN AEROBIC AND ANAEROBIC Blood Culture results may not be optimal due to an inadequate volume of blood received in culture bottles Performed at [facility], [address], [place], [place] [address]    Culture  Setup Time (A)  Final    GRAM VARIABLE ROD AEROBIC BOTTLE ONLY CRITICAL RESULT CALLED TO, READ BACK BY AND VERIFIED WITH: PHARMD MARY S on [phone] @1515  by SM    Culture (A)  Final    GRAM VARIABLE ROD IDENTIFICATION TO FOLLOW Performed at [facility], [address], [place], [place] [address]    Report Status PENDING  Incomplete  Blood Culture ID Panel (Reflexed)     Status: None   Collection Time: 06/01/24  1:11 PM  Result Value Ref Range Status   Enterococcus faecalis NOT DETECTED NOT DETECTED Final   Enterococcus Faecium NOT DETECTED NOT DETECTED Final   Listeria monocytogenes NOT DETECTED NOT DETECTED Final   Staphylococcus species NOT DETECTED NOT DETECTED Final   Staphylococcus aureus (BCID) NOT DETECTED NOT DETECTED Final   Staphylococcus epidermidis NOT DETECTED NOT DETECTED Final   Staphylococcus lugdunensis NOT DETECTED NOT DETECTED Final   Streptococcus species NOT DETECTED NOT DETECTED Final   Streptococcus agalactiae NOT DETECTED NOT DETECTED Final   Streptococcus pneumoniae NOT DETECTED NOT DETECTED Final   Streptococcus pyogenes NOT DETECTED NOT DETECTED Final   A.calcoaceticus-baumannii NOT DETECTED NOT DETECTED Final   Bacteroides fragilis NOT DETECTED NOT DETECTED Final   Enterobacterales NOT DETECTED NOT DETECTED Final   Enterobacter cloacae complex NOT DETECTED NOT DETECTED Final   Escherichia coli NOT DETECTED NOT DETECTED Final   Klebsiella aerogenes NOT DETECTED  NOT DETECTED Final   Klebsiella oxytoca NOT DETECTED NOT DETECTED Final   Klebsiella pneumoniae NOT DETECTED NOT DETECTED Final   Proteus species NOT DETECTED NOT DETECTED Final   Salmonella species NOT DETECTED NOT DETECTED Final   Serratia marcescens NOT DETECTED NOT DETECTED Final   Haemophilus influenzae NOT DETECTED NOT DETECTED Final   Neisseria meningitidis NOT DETECTED NOT DETECTED Final   Pseudomonas aeruginosa NOT DETECTED NOT DETECTED Final   Stenotrophomonas maltophilia NOT DETECTED NOT DETECTED Final   Candida albicans NOT DETECTED NOT DETECTED Final   Candida auris NOT DETECTED NOT DETECTED Final   Candida glabrata NOT DETECTED NOT DETECTED Final   Candida krusei NOT DETECTED NOT DETECTED Final   Candida parapsilosis NOT DETECTED NOT DETECTED Final   Candida tropicalis NOT DETECTED NOT DETECTED Final   Cryptococcus neoformans/gattii NOT DETECTED  NOT DETECTED Final    Comment: Performed at [facility], [address], [place], [place] [address]  Resp panel by RT-PCR (RSV, Flu A&B, Covid)     Status: Abnormal   Collection Time: 06/01/24  1:20 PM  Result Value Ref Range Status   SARS Coronavirus 2 by RT PCR POSITIVE (A) NEGATIVE Final    Comment: (NOTE) SARS-CoV-2 target nucleic acids are DETECTED.  The SARS-CoV-2 RNA is generally detectable in upper respiratory specimens during the acute phase of infection. Positive results are indicative of the presence of the identified virus, but do not rule out bacterial infection or co-infection with other pathogens not detected by the test. Clinical correlation with patient history and other diagnostic information is necessary to determine patient infection status. The expected result is Negative.  Fact Sheet for Patients: BloggerCourse.com  Fact Sheet for Healthcare Providers: SeriousBroker.it  This test is not yet approved or cleared by the [place]  FDA and  has  been authorized for detection and/or diagnosis of SARS-CoV-2 by FDA under an Emergency Use Authorization (EUA).  This EUA will remain in effect (meaning this test can be used) for the duration of  the COVID-19 declaration under Section 564(b)(1) of the A ct, 21 U.S.C. section 360bbb-3(b)(1), unless the authorization is terminated or revoked sooner.     Influenza A by PCR NEGATIVE NEGATIVE Final   Influenza B by PCR NEGATIVE NEGATIVE Final    Comment: (NOTE) The Xpert Xpress SARS-CoV-2/FLU/RSV plus assay is intended as an aid in the diagnosis of influenza from Nasopharyngeal swab specimens and should not be used as a sole basis for treatment. Nasal washings and aspirates are unacceptable for Xpert Xpress SARS-CoV-2/FLU/RSV testing.  Fact Sheet for Patients: BloggerCourse.com  Fact Sheet for Healthcare Providers: SeriousBroker.it  This test is not yet approved or cleared by the [place]  FDA and has been authorized for detection and/or diagnosis of SARS-CoV-2 by FDA under an Emergency Use Authorization (EUA). This EUA will remain in effect (meaning this test can be used) for the duration of the COVID-19 declaration under Section 564(b)(1) of the Act, 21 U.S.C. section 360bbb-3(b)(1), unless the authorization is terminated or revoked.     Resp Syncytial Virus by PCR NEGATIVE NEGATIVE Final    Comment: (NOTE) Fact Sheet for Patients: BloggerCourse.com  Fact Sheet for Healthcare Providers: SeriousBroker.it  This test is not yet approved or cleared by the [place]  FDA and has been authorized for detection and/or diagnosis of SARS-CoV-2 by FDA under an Emergency Use Authorization (EUA). This EUA will remain in effect (meaning this test can be used) for the duration of the COVID-19 declaration under Section 564(b)(1) of the Act, 21 U.S.C. section 360bbb-3(b)(1), unless the  authorization is terminated or revoked.  Performed at [facility], [address] [place], [place] [address]   Blood culture (routine x 2)     Status: None (Preliminary result)   Collection Time: 06/01/24  7:26 PM   Specimen: BLOOD RIGHT HAND  Result Value Ref Range Status   Specimen Description   Final    BLOOD RIGHT HAND Performed at [facility], [address], [place], [place] [address]    Special Requests   Final    BOTTLES DRAWN AEROBIC ONLY Blood Culture results may not be optimal due to an inadequate volume of blood received in culture bottles Performed at [facility], [address], [place], [place] [address]    Culture   Final  NO GROWTH 3 DAYS Performed at [facility], [address] [place], [place] [address]    Report Status PENDING  Incomplete         Radiology Studies: No results found.       Scheduled Meds:  apixaban   5 mg Oral BID   atorvastatin   20 mg Oral QHS   fluticasone  furoate-vilanterol  1 puff Inhalation Daily   gabapentin   600 mg Oral QHS   hydrocortisone   5 mg Oral Daily   loratadine   10 mg Oral Daily   LORazepam   0.5 mg Oral Q12H   pantoprazole   40 mg Oral Daily   QUEtiapine   300 mg Oral QHS   senna-docusate  2 tablet Oral BID   umeclidinium bromide   1 puff Inhalation Daily   Continuous Infusions:  ampicillin -sulbactam (UNASYN ) IV 1.5 g (06/05/24 0629)     LOS: 4 days    Time spent: 48 minutes spent on 06/05/2024 caring for this patient face-to-face including chart review, ordering labs/tests, documenting, discussion with nursing staff, consultants, updating family and interview/physical exam    Camellia PARAS [place], DO [facility] Available via Epic secure chat 7am-7pm After these hours, please refer to coverage provider listed on [email or web address] 06/05/2024, 10:45 AM

## 2024-06-05 NOTE — Plan of Care (Signed)

## 2024-06-06 DIAGNOSIS — J189 Pneumonia, unspecified organism: Secondary | ICD-10-CM | POA: Diagnosis not present

## 2024-06-06 DIAGNOSIS — Z7189 Other specified counseling: Secondary | ICD-10-CM | POA: Diagnosis not present

## 2024-06-06 DIAGNOSIS — Z515 Encounter for palliative care: Secondary | ICD-10-CM | POA: Diagnosis not present

## 2024-06-06 LAB — CBC
HCT: 33.7 % — ABNORMAL LOW (ref 36.0–46.0)
Hemoglobin: 10.8 g/dL — ABNORMAL LOW (ref 12.0–15.0)
MCH: 29.9 pg (ref 26.0–34.0)
MCHC: 32 g/dL (ref 30.0–36.0)
MCV: 93.4 fL (ref 80.0–100.0)
Platelets: 221 K/uL (ref 150–400)
RBC: 3.61 MIL/uL — ABNORMAL LOW (ref 3.87–5.11)
RDW: 13.1 % (ref 11.5–15.5)
WBC: 6.3 K/uL (ref 4.0–10.5)
nRBC: 0 % (ref 0.0–0.2)

## 2024-06-06 LAB — URINALYSIS, ROUTINE W REFLEX MICROSCOPIC
Bacteria, UA: NONE SEEN
Bilirubin Urine: NEGATIVE
Glucose, UA: NEGATIVE mg/dL
Ketones, ur: NEGATIVE mg/dL
Leukocytes,Ua: NEGATIVE
Nitrite: NEGATIVE
Protein, ur: NEGATIVE mg/dL
RBC / HPF: >50 RBC/hpf (ref 0–5)
Specific Gravity, Urine: 1.016 (ref 1.005–1.030)
pH: 6 (ref 5.0–8.0)

## 2024-06-06 LAB — CULTURE, BLOOD (ROUTINE X 2): Culture  Setup Time: NO GROWTH — AB

## 2024-06-06 LAB — BASIC METABOLIC PANEL WITH GFR
Anion gap: 10 (ref 5–15)
BUN: 17 mg/dL (ref 8–23)
CO2: 26 mmol/L (ref 22–32)
Calcium: 9.3 mg/dL (ref 8.9–10.3)
Chloride: 108 mmol/L (ref 98–111)
Creatinine, Ser: 0.74 mg/dL (ref 0.44–1.00)
GFR, Estimated: >60 mL/min (ref >60)
Glucose, Bld: 111 mg/dL — ABNORMAL HIGH (ref 70–99)
Potassium: 3.4 mmol/L — ABNORMAL LOW (ref 3.5–5.1)
Sodium: 144 mmol/L (ref 135–145)

## 2024-06-06 MED ORDER — POTASSIUM CHLORIDE 10 MEQ/100ML IV SOLN
10.0000 meq | INTRAVENOUS | Status: AC
  Administered 2024-06-06 (×3): 10 meq via INTRAVENOUS
  Filled 2024-06-06: qty 100

## 2024-06-06 NOTE — Progress Notes (Addendum)
 Palliative Medicine Inpatient Follow Up Note HPI: Patient is an 83 year old female with advanced dementia living at [facility], COPD/pulmonary fibrosis, and history of aspiration pneumonia who was admitted on 06/01/2024 to assist with generalized weakness and altered mental status. Patient found to have COVID infection along with recurrent aspiration and Tracy lumbar compression fractures. Palliative medicine team consulted to assist with complex medical decision making.   Today's Discussion 06/06/2024  *Please note that this is a verbal dictation therefore any spelling or grammatical errors are due to the Dragon Medical One system interpretation.  Chart reviewed inclusive of vital signs, progress notes, laboratory results, and diagnostic images. Tracy Dickerson lives at [facility] memory care facility.   I spoke with Tracy Dickerson's RN, Tracy Dickerson. She shares that Tracy Dickerson has not wanted to take her medications and has had little oral intake though today better than yesterday.   Patients nurse tech shares that Tracy Dickerson had only a few bites of food but did seem to enjoy the peaches.   I met with Tracy Dickerson at bedside she is awake and aware of self. She is unable to share with me where we are or why we are here. She shares that she is having some pain in her abdomen. She did allow me to compress on her abdomen and appeared to be uncomfortable in the suprapubic area. I shared that I am concerned she is retaining urine and that the nursing staff would pursue a bladder scan.   I called and spoke with patients legal guardian, Tracy Dickerson. We had a long conversation in regards to the recommendations moving forward for Tracy Dickerson. I shared my concern as Tracy Dickerson is eating very little, at times combative, and generally not interested in the nursing or medical staff providing care. She does not want to take her medications at this juncture. We reviewed the idea of her going to skilled nursing. I shared often if a patient is not  following direction in this environment I would anticipate this will worsen wherever she transitions to given the lack of familiarity with the environment.   We discussed dementia and the progressive decline that we often see as a result of this. We reviewed that it is very common for patients to have a lack of want, need, and desire to eat and drink. I shared this is very much a part of their natural trajectory and EOL process. We reviewed what death looks like when an individual stops eating and drinking.   I reviewed Dr. Elridge recommendations which the PMT is in agreement with towards hospice care. I described hospice as a service for patients who have a life expectancy of 6 months or less. The goal of hospice is the preservation of dignity and quality at the end phases of life. Under hospice care, the focus changes from curative to symptom relief.   Tracy is in agreement with transition to hospice. She requests a letter to further support this. I shared that I would let Dr. [place] know and then I would email her the letter draft thereafter.   Questions and concerns addressed/Palliative Support Provided.   Objective Assessment: Vital Signs Vitals:   06/05/24 0528 06/05/24 1900  BP: 117/67 (!) 168/85  Pulse: 77 95  Resp: 20   Temp: 98.4 F (36.9 C) 98.6 F (37 C)  SpO2: 95%     Intake/Output Summary (Last 24 hours) at 06/06/2024 1601 Last data filed at 06/06/2024 1528 Gross per 24 hour  Intake 721.67 ml  Output 1000 ml  Net -  278.33 ml   Last Weight  Most recent update: 06/02/2024  7:01 AM    Weight  78.4 kg (172 lb 13.5 oz)            Gen:  Elderly Caucasian F chronically ill in appearance HEENT: Dry mucous membranes CV: Regular rate and rhythm  PULM:  On RA, breathing is even and nonlabored ABD: soft/nontender  EXT: No edema  Neuro: Alert and oriented x3   SUMMARY OF RECOMMENDATIONS   DNAR/DNI  I have spoken to patients guardian, Tracy Dickerson who agrees with  hospice  A formal letter has been created by Dr. [place] and sent to Tracy Dickerson's email supporting the decision for hospice  Tracy [facility] has been made aware of this change  The PMT will continue to follow along ______________________________________________________________________________________ [facility] Palliative Medicine Team Team Cell Phone: [phone] Please utilize secure chat with additional questions, if there is no response within 30 minutes please call the above phone number  Time Spent: 23  Palliative Medicine Team providers are available by phone from 7am to 7pm daily and can be reached through the team cell phone.  Should this patient require assistance outside of these hours, please call the patient's attending physician.

## 2024-06-06 NOTE — Plan of Care (Signed)

## 2024-06-06 NOTE — Progress Notes (Signed)
 Pts abdomen noted distended. Bladder scan preformed >396 noted. In/out cath (per order)resulted in 600 ml of amber colored urine. Clean catch urine specimen sent to lab per order.

## 2024-06-06 NOTE — Plan of Care (Signed)
  Problem: Education: Goal: Knowledge of General Education information will improve Description: Including pain rating scale, medication(s)/side effects and non-pharmacologic comfort measures Outcome: Not Progressing return to baseline   Problem: Health Behavior/Discharge Planning: Goal: Ability to manage health-related needs will improve Outcome: Not Progressing   Problem: Clinical Measurements: Goal: Ability to maintain clinical measurements within normal limits will improve Outcome: Not Progressing Goal: Will remain free from infection Outcome: Not Progressing Goal: Diagnostic test results will improve Outcome: Not Progressing Goal: Respiratory complications will improve Outcome: Not Progressing Goal: Cardiovascular complication will be avoided Outcome: Not Progressing   Problem: Activity: Goal: Risk for activity intolerance will decrease Outcome: Not Progressing   Problem: Nutrition: Goal: Adequate nutrition will be maintained Outcome: Not Progressing   Problem: Coping: Goal: Level of anxiety will decrease Outcome: Not Progressing   Problem: Elimination: Goal: Will not experience complications related to bowel motility Outcome: Not Progressing Goal: Will not experience complications related to urinary retention Outcome: Not Progressing   Problem: Pain Managment: Goal: General experience of comfort will improve and/or be controlled Outcome: Not Progressing   Problem: Safety: Goal: Ability to remain free from injury will improve Outcome: Not Progressing   Problem: Skin Integrity: Goal: Risk for impaired skin integrity will decrease Outcome: Not Progressing

## 2024-06-06 NOTE — Progress Notes (Signed)
 PROGRESS NOTE    Tracy Dickerson  FMW:[id number] DOB: 1941-07-01 DOA: 06/01/2024 PCP: Default, Provider, MD    Brief Narrative:   Tracy Dickerson is a 83 y.o. female with past medical history significant for HTN, HLD, CAD, paroxysmal atrial fibrillation on Eliquis , COPD/pulmonary fibrosis, dementia, GERD who presented to [facility] ED on 06/01/2024 via EMS from [facility] complaining of confusion and back pain.  Patient unable to provide any history due to her dementia.  Recently admitted July 2025 with pneumonia.  In the ED, temperature 97.5 F, HR 103, RR 16, BP 125/71, SpO2 97% on room air.  WBC 13.7, hemoglobin 15.1, platelet count 173.  Sodium 143, potassium 4.9, chloride 105, CO2 25, glucose 98, BUN 20, creatinine 0.96.  AST 57, ALT 14, total bilirubin 0.7.  Lactic acid 1.7.  COVID PCR positive.  RSV/influenza A/B PCR negative.  CT head without contrast with no evidence for acute intracranial malady, noted atrophy and mild chronic small vessel ischemic changes of the white matter.  CT chest/abdomen/pelvis with acute fracture superior aspect T12 vertebrae and L5 vertebrae, similar appearance L1 compression fracture, partial occlusion in the right lower lobe bronchus likely related to mucus impaction versus aspiration, although endobronchial lesion not excluded, patchy areas of increased density right lung base scarring/fibrosis versus developing infiltrate, mild bilateral hydronephrosis, right greater than left without obstructing stone.  Patient was started on IV antibiotics.  TRH consulted for admission for further evaluation and management of confusion, aspiration pneumonia.  Assessment & Plan:   Sepsis, POA Aspiration pneumonia Patient presenting with confusion; in the setting of advanced dementia.  Patient afebrile with elevated WBC count of 13.7.  CT chest with findings of partial occlusion right lower lobe bronchus secondary to mucus impaction versus aspiration; although endobronchial  lesion not excluded; additionally patchy areas of increased density right lung base concerning for developing infiltrate. -- WBC 13.7>13.8>11.8>7.6 -- Unasyn  1.5 g IV every 6 hours -- Continue supplemental oxygen, goal SpO2 > 88%; oxygen remains weaned off  COVID viral infection COVID-19 PCR positive.  Imaging findings not consistent with viral pneumonia. -- Continue airborne/contact isolation precautions -- Supportive care  Spinal compression fractures CT chest/abdomen/pelvis with acute findings of T12 and L5 compression fractures, known history of L1 compression fracture.  Patient [facility] resides at [facility]. --Tramadol  50 mg p.o. every 6 hours as needed moderate pain  -- PT/OT recommend SNF placement, TOC following  HTN Currently not on antihypertensives outpatient -- BP 168/85 this a.m., continue to monitor  Paroxysmal atrial fibrillation Currently not on any rate controlling medications outpatient -- Eliquis  5 mg p.o. twice daily  CAD HLD -- Atorvastatin  20 mg p.o. daily  COPD/pulmonary fibrosis -- Breo Ellipta  1 puff daily (substituted for home Advair) -- Incruse Ellipta  1 puff daily (substituted for home Spiriva ) -- Continue home Cortef  5mg  PO daily -- Albuterol  neb q2h PRN wheezing/shortness of breath  GERD -- Protonix  40 mg p.o. 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 every 12 hours -- Palliative care following for assistance with goals of care/medical decision making given overall prognosis poor as patient refusing to eat and take her medications; would benefit from transitioning to hospice level of care in the outpatient setting   DVT prophylaxis:  apixaban  (ELIQUIS ) tablet 5 mg    Code Status: Limited: Do not attempt resuscitation (DNR) -DNR-LIMITED -Do Not Intubate/DNI  Family Communication: No family present at  bedside this morning  Disposition Plan:   Level of care: Med-Surg Status is: Inpatient Remains inpatient appropriate because: IV antibiotics, pending SNF placement; prognosis poor/guarded    Consultants:  Palliative care  Procedures:  None  Antimicrobials:  Cefepime  9/9 - 9/10 Flagyl  9/9 - 9/9 Vancomycin  9/9 - 9/9 Unasyn  9/10>>   Subjective: Patient seen examined bedside, lying in bed.  Will awaken to command, remains pleasantly confused.  Continues to refuse to eat or take her medications (although did take most evening meds last night).  No family present.  Would benefit from transitioning to hospice given her continued decline.  Unable to obtain any further ROS.  No acute events overnight per nursing staff.    Pending SNF placement.  Overall prognosis poor/guarded with recommendation of transitioning to hospice level of care.  Objective: Vitals:   06/04/24 0428 06/04/24 2335 06/05/24 0528 06/05/24 1900  BP: (!) 152/69 (!) 152/81 117/67 (!) 168/85  Pulse: 86 (!) 108 77 95  Resp: 18 18 20    Temp: 97.6 F (36.4 C) 97.9 F (36.6 C) 98.4 F (36.9 C) 98.6 F (37 C)  TempSrc: Axillary     SpO2: 92%  95%   Weight:        Intake/Output Summary (Last 24 hours) at 06/06/2024 1136 Last data filed at 06/05/2024 1835 Gross per 24 hour  Intake --  Output 1000 ml  Net -1000 ml   Filed Weights   06/01/24 1810 06/02/24 0700  Weight: (P) 80.1 kg 78.4 kg    Examination:  Physical Exam: GEN: NAD, alert, pleasantly confused and not oriented to person/place/time or situation, chronically ill/elderly in appearance HEENT: NCAT, PERRL, sclera clear, dry mucous membranes PULM: Breath sounds diminished bilateral bases, crackles right base, no wheezing, normal respiratory effort without accessory muscle use; on room air with SpO2 95% at rest CV: RRR w/o M/G/R GI: abd soft, NTND, + BS MSK: no peripheral edema Integumentary: No concerning rashes/lesions/wounds noted on exposed skin surfaces    Data Reviewed: I have  personally reviewed following labs and imaging studies  CBC: Recent Labs  Lab 06/01/24 1155 06/02/24 0625 06/03/24 0419 06/04/24 0543 06/06/24 0408  WBC 13.7* 13.8* 11.8* 7.6 6.3  NEUTROABS 11.7*  --   --   --   --   HGB 15.1* 11.2* 9.6* 9.8* 10.8*  HCT 47.3* 36.9 29.6* 31.0* 33.7*  MCV 98.5 97.4 100.3* 100.3* 93.4  PLT 173 161 165 168 221   Basic Metabolic Panel: Recent Labs  Lab 06/01/24 1300 06/02/24 0625 06/03/24 0419 06/06/24 0408  NA 143 142 143 144  K 4.9 4.0 3.9 3.4*  CL 105 108 110 108  CO2 25 21* 24 26  GLUCOSE 98 103* 105* 111*  BUN 20 19 23 17   CREATININE 0.96 0.93 0.90 0.74  CALCIUM  10.1 9.2 8.9 9.3  MG  --   --  2.4  --   PHOS  --   --  3.0  --    GFR: Estimated Creatinine Clearance: 59.2 mL/min (by C-G formula based on SCr of 0.74 mg/dL). Liver Function Tests: Recent Labs  Lab 06/01/24 1300 06/03/24 0419  AST 57* 27  ALT 14 7  ALKPHOS 100 91  BILITOT 0.8 0.5  PROT 7.8 5.8*  ALBUMIN 4.0 3.0*   Recent Labs  Lab 06/01/24 1300  LIPASE 15   No results for input(s): AMMONIA in the last 168 hours. Coagulation Profile: No results for input(s): INR, PROTIME in the last 168 hours. Cardiac Enzymes: No  results for input(s): CKTOTAL, CKMB, CKMBINDEX, TROPONINI in the last 168 hours. BNP (last 3 results) No results for input(s): PROBNP in the last 8760 hours. HbA1C: No results for input(s): HGBA1C in the last 72 hours. CBG: No results for input(s): GLUCAP in the last 168 hours. Lipid Profile: No results for input(s): CHOL, HDL, LDLCALC, TRIG, CHOLHDL, LDLDIRECT in the last 72 hours. Thyroid  Function Tests: No results for input(s): TSH, T4TOTAL, FREET4, T3FREE, THYROIDAB in the last 72 hours. Anemia Panel: No results for input(s): VITAMINB12, FOLATE, FERRITIN, TIBC, IRON , RETICCTPCT in the last 72 hours. Sepsis Labs: Recent Labs  Lab 06/01/24 1300 06/01/24 1450  LATICACIDVEN 1.7 1.7     Recent Results (from the past 240 hours)  Blood culture (routine x 2)     Status: Abnormal   Collection Time: 06/01/24  1:11 PM   Specimen: BLOOD LEFT ARM  Result Value Ref Range Status   Specimen Description   Final    BLOOD LEFT ARM Performed at [facility], [address], [place], [place] [address]    Special Requests   Final    BOTTLES DRAWN AEROBIC AND ANAEROBIC Blood Culture results may not be optimal due to an inadequate volume of blood received in culture bottles Performed at [facility], [address], [place], [place] [address]    Culture  Setup Time (A)  Final    GRAM VARIABLE ROD AEROBIC BOTTLE ONLY CRITICAL RESULT CALLED TO, READ BACK BY AND VERIFIED WITH: PHARMD MARY S on [id number] @1515  by SM    Culture (A)  Final    CORYNEBACTERIUM UREALYTICUM Standardized susceptibility testing for this organism is not available. Performed at [facility], [address], [place], [place] [address]    Report Status 06/06/2024 FINAL  Final  Blood Culture ID Panel (Reflexed)     Status: None   Collection Time: 06/01/24  1:11 PM  Result Value Ref Range Status   Enterococcus faecalis NOT DETECTED NOT DETECTED Final   Enterococcus Faecium NOT DETECTED NOT DETECTED Final   Listeria monocytogenes NOT DETECTED NOT DETECTED Final   Staphylococcus species NOT DETECTED NOT DETECTED Final   Staphylococcus aureus (BCID) NOT DETECTED NOT DETECTED Final   Staphylococcus epidermidis NOT DETECTED NOT DETECTED Final   Staphylococcus lugdunensis NOT DETECTED NOT DETECTED Final   Streptococcus species NOT DETECTED NOT DETECTED Final   Streptococcus agalactiae NOT DETECTED NOT DETECTED Final   Streptococcus pneumoniae NOT DETECTED NOT DETECTED Final   Streptococcus pyogenes NOT DETECTED NOT DETECTED Final   A.calcoaceticus-baumannii NOT DETECTED NOT DETECTED Final   Bacteroides fragilis NOT DETECTED NOT DETECTED Final   Enterobacterales NOT DETECTED NOT  DETECTED Final   Enterobacter cloacae complex NOT DETECTED NOT DETECTED Final   Escherichia coli NOT DETECTED NOT DETECTED Final   Klebsiella aerogenes NOT DETECTED NOT DETECTED Final   Klebsiella oxytoca NOT DETECTED NOT DETECTED Final   Klebsiella pneumoniae NOT DETECTED NOT DETECTED Final   Proteus species NOT DETECTED NOT DETECTED Final   Salmonella species NOT DETECTED NOT DETECTED Final   Serratia marcescens NOT DETECTED NOT DETECTED Final   Haemophilus influenzae NOT DETECTED NOT DETECTED Final   Neisseria meningitidis NOT DETECTED NOT DETECTED Final   Pseudomonas aeruginosa NOT DETECTED NOT DETECTED Final   Stenotrophomonas maltophilia NOT DETECTED NOT DETECTED Final   Candida albicans NOT DETECTED NOT DETECTED Final   Candida auris NOT DETECTED NOT DETECTED Final   Candida glabrata NOT DETECTED NOT DETECTED Final   Candida  krusei NOT DETECTED NOT DETECTED Final   Candida parapsilosis NOT DETECTED NOT DETECTED Final   Candida tropicalis NOT DETECTED NOT DETECTED Final   Cryptococcus neoformans/gattii NOT DETECTED NOT DETECTED Final    Comment: Performed at [facility], [address], [place], [place] [address]  Resp panel by RT-PCR (RSV, Flu A&B, Covid)     Status: Abnormal   Collection Time: 06/01/24  1:20 PM  Result Value Ref Range Status   SARS Coronavirus 2 by RT PCR POSITIVE (A) NEGATIVE Final    Comment: (NOTE) SARS-CoV-2 target nucleic acids are DETECTED.  The SARS-CoV-2 RNA is generally detectable in upper respiratory specimens during the acute phase of infection. Positive results are indicative of the presence of the identified virus, but do not rule out bacterial infection or co-infection with other pathogens not detected by the test. Clinical correlation with patient history and other diagnostic information is necessary to determine patient infection status. The expected result is Negative.  Fact Sheet for  Patients: BloggerCourse.com  Fact Sheet for Healthcare Providers: SeriousBroker.it  This test is not yet approved or cleared by the [place]  FDA and  has been authorized for detection and/or diagnosis of SARS-CoV-2 by FDA under an Emergency Use Authorization (EUA).  This EUA will remain in effect (meaning this test can be used) for the duration of  the COVID-19 declaration under Section 564(b)(1) of the A ct, 21 U.S.C. section 360bbb-3(b)(1), unless the authorization is terminated or revoked sooner.     Influenza A by PCR NEGATIVE NEGATIVE Final   Influenza B by PCR NEGATIVE NEGATIVE Final    Comment: (NOTE) The Xpert Xpress SARS-CoV-2/FLU/RSV plus assay is intended as an aid in the diagnosis of influenza from Nasopharyngeal swab specimens and should not be used as a sole basis for treatment. Nasal washings and aspirates are unacceptable for Xpert Xpress SARS-CoV-2/FLU/RSV testing.  Fact Sheet for Patients: BloggerCourse.com  Fact Sheet for Healthcare Providers: SeriousBroker.it  This test is not yet approved or cleared by the [place]  FDA and has been authorized for detection and/or diagnosis of SARS-CoV-2 by FDA under an Emergency Use Authorization (EUA). This EUA will remain in effect (meaning this test can be used) for the duration of the COVID-19 declaration under Section 564(b)(1) of the Act, 21 U.S.C. section 360bbb-3(b)(1), unless the authorization is terminated or revoked.     Resp Syncytial Virus by PCR NEGATIVE NEGATIVE Final    Comment: (NOTE) Fact Sheet for Patients: BloggerCourse.com  Fact Sheet for Healthcare Providers: SeriousBroker.it  This test is not yet approved or cleared by the [place]  FDA and has been authorized for detection and/or diagnosis of SARS-CoV-2 by FDA under an Emergency Use  Authorization (EUA). This EUA will remain in effect (meaning this test can be used) for the duration of the COVID-19 declaration under Section 564(b)(1) of the Act, 21 U.S.C. section 360bbb-3(b)(1), unless the authorization is terminated or revoked.  Performed at [facility], [address], [place], [place] [address]   Blood culture (routine x 2)     Status: None (Preliminary result)   Collection Time: 06/01/24  7:26 PM   Specimen: BLOOD RIGHT HAND  Result Value Ref Range Status   Specimen Description   Final    BLOOD RIGHT HAND Performed at [facility], [address] [place], [place] [address]    Special Requests   Final    BOTTLES DRAWN AEROBIC ONLY Blood Culture results may not be optimal due to  an inadequate volume of blood received in culture bottles Performed at [facility], [address], [place], [place] [address]    Culture   Final    NO GROWTH 4 DAYS Performed at [facility], [address] [place], [place] [address]    Report Status PENDING  Incomplete         Radiology Studies: No results found.       Scheduled Meds:  apixaban   5 mg Oral BID   atorvastatin   20 mg Oral QHS   Chlorhexidine  Gluconate Cloth  6 each Topical Daily   fluticasone  furoate-vilanterol  1 puff Inhalation Daily   gabapentin   600 mg Oral QHS   hydrocortisone   5 mg Oral Daily   loratadine   10 mg Oral Daily   LORazepam   0.5 mg Oral Q12H   pantoprazole   40 mg Oral Daily   QUEtiapine   300 mg Oral QHS   senna-docusate  2 tablet Oral BID   umeclidinium bromide   1 puff Inhalation Daily   Continuous Infusions:  ampicillin -sulbactam (UNASYN ) IV 1.5 g (06/06/24 0650)   potassium chloride  10 mEq (06/06/24 1009)     LOS: 5 days    Time spent: 48 minutes spent on 06/06/2024 caring for this patient face-to-face including chart review, ordering labs/tests, documenting, discussion with nursing staff, consultants, updating family and  interview/physical exam    Camellia PARAS [place], DO [facility] Available via Epic secure chat 7am-7pm After these hours, please refer to coverage provider listed on [email or web address] 06/06/2024, 11:36 AM

## 2024-06-06 NOTE — Progress Notes (Signed)
 Physical Therapy Treatment Patient Details Name: Tracy Dickerson MRN: [id number] DOB: [birth year] Today's Date: 06/06/2024   History of Present Illness Tracy Dickerson is a 83 y.o. female presented with increased confusion over her baseline and found to have evidence of recurrent pneumonia and COVID; as well as T12 and L5 compression fxs.  PHMx:  dementia, GERD, hypertension, atrial fibrillation, cancer (breast and lung), DM2, heart disease, and sepsis due to pneumonia earlier 2025    PT Comments  Nursing reports Pt not awake enough to eat or take meds.  Responds to name only with eyes shut.  Hx Dementia.  Unable to follow any commands.  Assisted to EOB was very difficult.  General bed mobility comments: Very difficult and + 2 Total Assist to partially transition pt from supine to EOB.  Pt screaming with pain back and resistant.  Pt unable to follow directions.  Pt unable to achieve eye contact.  Remains confused with inability to follow commands.  Unable to achieve a full 90/90 upright sitting EOB with severe posterior lean/pushing.  EOB < 1 min.  Total Assist + 2 back to bed then position to comfort.  Pillow under flexed knees to decrease back pain. LPT has rec Pt will need ST Rehab at SNF to address mobility and functional decline prior to safely returning her Memory Care Unit where she amb without any AD.    If plan is discharge home, recommend the following: Two people to help with walking and/or transfers;Two people to help with bathing/dressing/bathroom;Assistance with cooking/housework;Assist for transportation;Supervision due to cognitive status   Can travel by private vehicle     No  Equipment Recommendations  None recommended by PT    Recommendations for Other Services       Precautions / Restrictions Precautions Precautions: Fall;Back Recall of Precautions/Restrictions: Impaired Precaution/Restrictions Comments: T12 Vertebral, L 5 Vertebral and L1  Comp Fx Restrictions Weight  Bearing Restrictions Per Provider Order: No     Mobility  Bed Mobility Overal bed mobility: Needs Assistance Bed Mobility: Supine to Sit, Sit to Supine           General bed mobility comments: Very difficult and + 2 Total Assist to partially transition pt from supine to EOB.  Pt screaming with pain back and resistant.  Pt unable to follow directions.  Pt unable to achieve eye contact.  Remains confused with inability to follow commands.  Unable to achieve a full 90/90 upright sitting EOB with severe posterior lean/pushing.  EOB < 1 min.  Total Assist + 2 back to bed then position to comfort.  Pillow under flexed knees to decrease back pain.    Transfers                   General transfer comment: unable    Ambulation/Gait                   Stairs             Wheelchair Mobility     Tilt Bed    Modified Rankin (Stroke Patients Only)       Balance                                            Communication    Cognition Arousal: Lethargic Behavior During Therapy: Flat affect  PT - Cognition Comments: 90% lethargic with brief words and no eye contact Following commands: Impaired      Cueing    Exercises      General Comments        Pertinent Vitals/Pain Pain Assessment Pain Assessment: Faces Faces Pain Scale: Hurts little more Pain Location: back Pain Descriptors / Indicators: Constant, Discomfort, Grimacing, Moaning Pain Intervention(s): Monitored during session, Repositioned    Home Living                          Prior Function            PT Goals (current goals can now be found in the care plan section) Progress towards PT goals: Not progressing toward goals - comment (lethargy/AMS)    Frequency    Min 2X/week      PT Plan      Co-evaluation              AM-PAC PT 6 Clicks Mobility   Outcome Measure  Help needed turning from your back  to your side while in a flat bed without using bedrails?: Total Help needed moving from lying on your back to sitting on the side of a flat bed without using bedrails?: Total Help needed moving to and from a bed to a chair (including a wheelchair)?: Total Help needed standing up from a chair using your arms (e.g., wheelchair or bedside chair)?: Total Help needed to walk in hospital room?: Total Help needed climbing 3-5 steps with a railing? : Total 6 Click Score: 6    End of Session   Activity Tolerance: Patient limited by pain;Patient limited by lethargy Patient left: in bed;with call bell/phone within reach;with bed alarm set Nurse Communication: Mobility status PT Visit Diagnosis: Other abnormalities of gait and mobility (R26.89);Muscle weakness (generalized) (M62.81)     Time: 1055-1110 PT Time Calculation (min) (ACUTE ONLY): 15 min  Charges:    $Therapeutic Activity: 8-22 mins PT General Charges $$ ACUTE PT VISIT: 1 Visit                    Katheryn Leap  PTA Acute  Rehabilitation Services Office M-F          [phone]

## 2024-06-07 ENCOUNTER — Other Ambulatory Visit

## 2024-06-07 DIAGNOSIS — J189 Pneumonia, unspecified organism: Secondary | ICD-10-CM | POA: Diagnosis not present

## 2024-06-07 LAB — BASIC METABOLIC PANEL WITH GFR
Anion gap: 12 (ref 5–15)
BUN: 21 mg/dL (ref 8–23)
CO2: 21 mmol/L — ABNORMAL LOW (ref 22–32)
Calcium: 9 mg/dL (ref 8.9–10.3)
Chloride: 112 mmol/L — ABNORMAL HIGH (ref 98–111)
Creatinine, Ser: 0.85 mg/dL (ref 0.44–1.00)
GFR, Estimated: >60 mL/min (ref >60)
Glucose, Bld: 77 mg/dL (ref 70–99)
Potassium: 4.2 mmol/L (ref 3.5–5.1)
Sodium: 145 mmol/L (ref 135–145)

## 2024-06-07 LAB — CBC
HCT: 32.7 % — ABNORMAL LOW (ref 36.0–46.0)
Hemoglobin: 10.2 g/dL — ABNORMAL LOW (ref 12.0–15.0)
MCH: 30.9 pg (ref 26.0–34.0)
MCHC: 31.2 g/dL (ref 30.0–36.0)
MCV: 99.1 fL (ref 80.0–100.0)
Platelets: 221 K/uL (ref 150–400)
RBC: 3.3 MIL/uL — ABNORMAL LOW (ref 3.87–5.11)
RDW: 13.7 % (ref 11.5–15.5)
WBC: 6.4 K/uL (ref 4.0–10.5)
nRBC: 0 % (ref 0.0–0.2)

## 2024-06-07 LAB — CULTURE, BLOOD (ROUTINE X 2): Culture: NO GROWTH

## 2024-06-07 LAB — PHOSPHORUS: Phosphorus: 3.3 mg/dL (ref 2.5–4.6)

## 2024-06-07 LAB — MAGNESIUM: Magnesium: 2.5 mg/dL — ABNORMAL HIGH (ref 1.7–2.4)

## 2024-06-07 MED ORDER — MORPHINE SULFATE (PF) 2 MG/ML IV SOLN
1.0000 mg | INTRAVENOUS | Status: DC | PRN
  Administered 2024-06-07: 2 mg via INTRAVENOUS
  Filled 2024-06-07: qty 1

## 2024-06-07 NOTE — Plan of Care (Signed)
  Problem: Clinical Measurements: Goal: Ability to maintain clinical measurements within normal limits will improve Outcome: Completed/Met   Problem: Clinical Measurements: Goal: Will remain free from infection Outcome: Completed/Met   Problem: Clinical Measurements: Goal: Diagnostic test results will improve Outcome: Completed/Met

## 2024-06-07 NOTE — TOC Progression Note (Addendum)
 Transition of Care [facility]) - Progression Note    Patient Details  Name: Tracy Dickerson MRN: [id number] Date of Birth: [birth year]  [facility] CM/SW Contact  Doneta Glenys DASEN, RN Phone Number: 06/07/2024, 8:36 AM  Clinical Narrative:    CM called Randy [phone] to inform her of the decision for Hospice. DeeDee informed me that they use [facility] for Hospice. DeeDee will speak with Crystal and call be back about COVID isolation. 9:43 AM Randy is requesting the Hospice order, an update FL2 with the medications that patient will be discharge on and the discharge summary before fully accepting patient. No COVID wait time. Dr. [place] updated with request. 11:20 AM Discharge Summary and FL2 has been emailed to [place] ([email or web address]) 12:12 PM CM spoke with patients 2 brothers Cheryl and Elnor. Both were visisting with patient. 2:09 PM [place] requested a change for diet, PT/OT and level of care be changed on FL2 and emailed.  Expected Discharge Plan: Memory Care [facility]) Barriers to Discharge: Continued Medical Work up               Expected Discharge Plan and Services In-house Referral: NA Discharge Planning Services: CM Consult   Living arrangements for the past 2 months:  (Memory Care)                 DME Arranged: N/A DME Agency: NA                   Social Drivers of Health (SDOH) Interventions SDOH Screenings   Food Insecurity: No Food Insecurity (11/21/2023)  Housing: Low Risk  (11/21/2023)  Transportation Needs: Patient Unable To Answer (06/02/2024)  Utilities: Not At Risk (11/21/2023)  Financial Resource Strain: Low Risk  (02/04/2023)   Received from [facility]  Physical Activity: Inactive (07/16/2021)   Received from [facility]  Social Connections: Patient Unable To Answer (11/21/2023)  Stress: No Stress Concern Present (07/16/2021)   Received from [facility]   Tobacco Use: Medium Risk (06/04/2024)  Health Literacy: Medium Risk (07/16/2021)   [facility]    Readmission Risk Interventions    03/27/2024   11:14 AM  Readmission Risk Prevention Plan  Transportation Screening Complete  Medication Review (RN Care Manager) Complete  PCP or Specialist appointment within 3-5 days of discharge Complete  HRI or Home Care Consult Complete  SW Recovery Care/Counseling Consult Complete  Palliative Care Screening Not Applicable  Skilled Nursing Facility Not Applicable

## 2024-06-07 NOTE — Plan of Care (Signed)
   Palliative Medicine Inpatient Follow Up Note   The PMT has reviewed the chart this morning.  Plan for discharge to [facility] memory care this afternoon with [facility] hospice.  No Charge. ______________________________________________________________________________________ Rosaline Becton [place] Palliative Medicine Team Team Cell Phone: [phone] Please utilize secure chat with additional questions, if there is no response within 30 minutes please call the above phone number  Palliative Medicine Team providers are available by phone from 7am to 7pm daily and can be reached through the team cell phone.  Should this patient require assistance outside of these hours, please call the patient's attending physician.

## 2024-06-07 NOTE — Progress Notes (Signed)
 [facility] Liaison Note  Received request from [facility] [place] for hospice services at [facility] after discharge. Spoke with patient's legal guardian Olam Pouch to initiate education related to hospice philosophy, services and team approach to care. Per discussion, the plan is for discharge to facility by [facility] today. .  DME needs discussed. Patient has the following equipment in the home: none. Guardian requests the following equipment: oxygen.  Please send signed and completed DNR home with patient/family. Please provide prescriptions at discharge as needed to ensure ongoing symptom management.  [facility] information and contact numbers given to [place].. Please call with any concerns.  Thank you for the opportunity to participate in this patient's care.   Eleanor Nail, LPN [facility] Liaison [phone]

## 2024-06-07 NOTE — NC FL2 (Addendum)
 [place]  MEDICAID FL2 LEVEL OF CARE FORM     IDENTIFICATION  Patient Name: Tracy Dickerson Birthdate: 1940-12-12 Sex: female Admission Date (Current Location): 06/01/2024  [facility] and [place] Number:  Producer, television/film/video and Address:  [facility], [address] [place], [place] [address]      Provider Number: [id number]  Attending Physician Name and Address:  [place], Eric J, DO  Relative Name and Phone Number:       Current Level of Care: Hospital Recommended Level of Care: Memory Care SCU Prior Approval Number:    Date Approved/Denied:   PASRR Number: [id number]  Discharge Plan: Memory Care SCU    Current Diagnoses: Patient Active Problem List   Diagnosis Date Noted   Counseling and coordination of care 06/04/2024   Goals of care, counseling/discussion 06/04/2024   DNR (do not resuscitate) 06/04/2024   COVID 06/02/2024   Palliative care by specialist 06/02/2024   HCAP (healthcare-associated pneumonia) 06/01/2024   Cough 03/26/2024   Fever 03/26/2024   Atrial fibrillation, chronic (HCC) 03/26/2024   Sepsis due to pneumonia (HCC) 03/26/2024   Atypical pneumonia 11/21/2023   ILD (interstitial lung disease) (HCC) 11/21/2023   Dementia with behavioral disturbance (HCC) 11/21/2023   Low serum cortisol level 09/02/2023   Headache 08/30/2023   Vitamin D  deficiency 08/28/2023   Dizziness 08/25/2023   Iron  deficiency anemia 08/23/2023   Iron  deficiency anemia due to chronic blood loss 08/21/2023   Syncope 08/20/2023   Normocytic anemia 08/20/2023   Obesity (BMI 30-39.9) 08/20/2023   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/2023   Vitamin B12 deficiency 06/20/2023   Carotid stenosis 06/20/2023   Dementia (HCC) 06/19/2023   Generalized weakness 06/18/2023   Acute on chronic anemia 06/18/2023   H/O: upper GI bleed 06/18/2023   Chronic anticoagulation 06/18/2023   Infestation by bed bug 06/18/2023   CKD stage 3a, GFR 45-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/min (HCC)  01/07/2023   Overweight (BMI 25.0-29.9) 01/06/2023   Type 2 diabetes mellitus with hyperlipidemia (HCC) 01/06/2023   COPD (chronic obstructive pulmonary disease) (HCC) 01/06/2023   Hypokalemia 01/06/2023   UGIB (upper gastrointestinal bleed) 01/05/2023   Unsatisfactory living conditions 01/05/2023   Constipation    UTI (urinary tract infection) 08/13/2020   HLD (hyperlipidemia) 08/13/2020   Acute renal failure superimposed on stage 3a chronic kidney disease (HCC) 08/13/2020   PAF (paroxysmal atrial fibrillation) (HCC) 08/13/2020   HTN (hypertension) 08/13/2020   Dehydration    Dehydration, moderate    Acute renal failure (HCC) 04/22/2020   Hyperkalemia    Septic shock (HCC)    Sepsis (HCC) 03/13/2018   Acute kidney injury superimposed on chronic kidney disease (HCC) 02/13/2018   Closed extra-articular fracture of distal tibia 02/02/2018   Community acquired bilateral lower lobe pneumonia 10/29/2017   Orthostatic hypotension 01/26/2017   Lung nodule 05/30/2014   Sleep apnea 05/10/2014   Barrett's esophagus 05/13/2007   Malignant neoplasm of breast (female) (HCC) 06/01/1997    Orientation RESPIRATION BLADDER Height & Weight     Self, Place  Normal Incontinent Weight: 78.4 kg Height:     BEHAVIORAL SYMPTOMS/MOOD NEUROLOGICAL BOWEL NUTRITION STATUS      Continent Diet (Thin) - No added table salt  AMBULATORY STATUS COMMUNICATION OF NEEDS Skin   Extensive Assist Verbally Normal                       Personal Care Assistance Level of Assistance  Bathing, Feeding, Dressing Bathing Assistance: Limited assistance  Feeding assistance: Limited assistance Dressing Assistance: Limited assistance     Functional Limitations Info             SPECIAL CARE FACTORS FREQUENCY  PT (By licensed PT), OT (By licensed OT)     Increase activity slowly            Contractures Contractures Info: Not present    Additional Factors Info  Code Status, Allergies, Psychotropic  Code Status Info: DNR - Comfort Allergies Info: Oxycodone , Penicillins, Percocet (Oxycodone -acetaminophen  Psychotropic Info: Quetiapine , Lorazepam          Current Medications (06/07/2024):  This is the current hospital active medication list Current Facility-Administered Medications  Medication Dose Route Frequency Provider Last Rate Last Admin   acetaminophen  (TYLENOL ) tablet 650 mg  650 mg Oral Q6H PRN Zella, Mir M, MD   650 mg at 06/05/24 2155   Or   acetaminophen  (TYLENOL ) suppository 650 mg  650 mg Rectal Q6H PRN Zella, Mir M, MD       albuterol  (PROVENTIL ) (2.5 MG/3ML) 0.083% nebulizer solution 2.5 mg  2.5 mg Nebulization Q2H PRN [place], Eric J, DO       ampicillin -sulbactam (UNASYN ) 1.5 g in sodium chloride  0.9 % 100 mL IVPB  1.5 g Intravenous Q6H Britta Eva HERO, RPH 200 mL/hr at 06/07/24 0628 1.5 g at 06/07/24 9371   Chlorhexidine  Gluconate Cloth 2 % PADS 6 each  6 each Topical Daily [place], Eric J, DO   6 each at 06/05/24 1523   fluticasone  furoate-vilanterol (BREO ELLIPTA ) 200-25 MCG/ACT 1 puff  1 puff Inhalation Daily Zella, Mir M, MD   1 puff at 06/07/24 0856   guaiFENesin  (ROBITUSSIN) 100 MG/5ML liquid 5 mL  5 mL Oral Q4H PRN Zella, Mir M, MD       haloperidol  lactate (HALDOL ) injection 2 mg  2 mg Intramuscular Q6H PRN [place], Eric J, DO       Or   haloperidol  lactate (HALDOL ) injection 2 mg  2 mg Intravenous Q6H PRN [place], Eric J, DO   2 mg at 06/05/24 1836   morphine  (PF) 2 MG/ML injection 1-2 mg  1-2 mg Intravenous Q3H PRN [place], Eric J, DO   2 mg at 06/07/24 1249   ondansetron  (ZOFRAN ) tablet 4 mg  4 mg Oral Q6H PRN Zella Katha HERO, MD       Or   ondansetron  (ZOFRAN ) injection 4 mg  4 mg Intravenous Q6H PRN Zella, Mir M, MD       polyethylene glycol (MIRALAX  / GLYCOLAX ) packet 17 g  17 g Oral Daily PRN [place], Eric J, DO       umeclidinium bromide  (INCRUSE ELLIPTA ) 62.5 MCG/ACT 1 puff  1 puff Inhalation Daily Zella, Mir M, MD   1 puff  at 06/07/24 9143     Discharge Medications: Please see discharge summary for a list of discharge medications. Medication List       STOP taking these medications     acetaminophen  500 MG tablet Commonly known as: TYLENOL     apixaban  5 MG Tabs tablet Commonly known as: ELIQUIS     atorvastatin  20 MG tablet Commonly known as: LIPITOR    bisacodyl  10 MG suppository Commonly known as: DULCOLAX    cetirizine  5 MG tablet Commonly known as: ZYRTEC     cimetidine 200 MG tablet Commonly known as: TAGAMET    cyanocobalamin  1000 MCG tablet Commonly known as: VITAMIN B12    folic acid  1 MG tablet Commonly known as: FOLVITE   gabapentin  300 MG capsule Commonly known as: NEURONTIN     Geri-Tussin 100 MG/5ML liquid Generic drug: guaiFENesin     hydrocortisone  5 MG tablet Commonly known as: CORTEF     LORazepam  0.5 MG tablet Commonly known as: ATIVAN     ondansetron  4 MG disintegrating tablet Commonly known as: Zofran  ODT    pantoprazole  40 MG tablet Commonly known as: Protonix     QUEtiapine  300 MG tablet Commonly known as: SEROQUEL     senna-docusate 8.6-50 MG tablet Commonly known as: Senokot-S    traMADol  50 MG tablet Commonly known as: ULTRAM            TAKE these medications     albuterol  (2.5 MG/3ML) 0.083% nebulizer solution Commonly known as: PROVENTIL  Take 3 mLs (2.5 mg total) by nebulization every 4 (four) hours as needed for wheezing or shortness of breath.    fluticasone -salmeterol 250-50 MCG/ACT Aepb Commonly known as: ADVAIR Inhale 1 puff into the lungs in the morning and at bedtime.    Spiriva  HandiHaler 18 MCG inhalation capsule Generic drug: tiotropium Place 1 capsule (18 mcg total) into inhaler and inhale daily.          Relevant Imaging Results:  Relevant Lab Results:   Additional Information SSN  [id number]  Doneta [place] DASEN, RN

## 2024-06-07 NOTE — Progress Notes (Signed)
 Patient refused to take meds and anything PO. NP made aware.

## 2024-06-07 NOTE — Discharge Summary (Signed)
 Physician Discharge Summary  ARLEAN THIES FMW:[id number] DOB: [birth year] DOA: 06/01/2024  PCP: Default, Provider, MD  Admit date: 06/01/2024 Discharge date: 06/07/2024  [facility] Disposition: [facility] under hospice  Recommendations for Outpatient Follow-up:  Follow up with hospice provider on discharge   Discharge Condition: Guarded CODE STATUS: DNR Diet recommendation: Comfort feeds as tolerates  History of present illness:  NILSA MACHT is a 83 y.o. female with past medical history significant for HTN, HLD, CAD, paroxysmal atrial fibrillation on Eliquis , COPD/pulmonary fibrosis, dementia, GERD who presented to [facility] ED on 06/01/2024 via EMS from [facility] complaining of confusion and back pain.  Patient unable to provide any history due to her dementia.   Recently admitted July 2025 with pneumonia.   In the ED, temperature 97.5 F, HR 103, RR 16, BP 125/71, SpO2 97% on room air.  WBC 13.7, hemoglobin 15.1, platelet count 173.  Sodium 143, potassium 4.9, chloride 105, CO2 25, glucose 98, BUN 20, creatinine 0.96.  AST 57, ALT 14, total bilirubin 0.7.  Lactic acid 1.7.  COVID PCR positive.  RSV/influenza A/B PCR negative.  CT head without contrast with no evidence for acute intracranial malady, noted atrophy and mild chronic small vessel ischemic changes of the white matter.  CT chest/abdomen/pelvis with acute fracture superior aspect T12 vertebrae and L5 vertebrae, similar appearance L1 compression fracture, partial occlusion in the right lower lobe bronchus likely related to mucus impaction versus aspiration, although endobronchial lesion not excluded, patchy areas of increased density right lung base scarring/fibrosis versus developing infiltrate, mild bilateral hydronephrosis, right greater than left without obstructing stone.  Patient was started on IV antibiotics.  TRH consulted for admission for further evaluation and management of  confusion, aspiration pneumonia.  Hospital course:  Sepsis, POA Aspiration pneumonia Patient presenting with confusion; in the setting of advanced dementia.  Patient afebrile with elevated WBC count of 13.7.  CT chest with findings of partial occlusion right lower lobe bronchus secondary to mucus impaction versus aspiration; although endobronchial lesion not excluded; additionally patchy areas of increased density right lung base concerning for developing infiltrate.  Patient was started on IV antibiotics with Unasyn  with resolution of leukocytosis.  Oxygen weaned off.  Now transitioning to comfort/hospice.  Aspiration precautions.   COVID viral infection COVID-19 PCR positive.  Imaging findings not consistent with viral pneumonia.  No indication for COVID-specific treatment at this time.   Spinal compression fractures CT chest/abdomen/pelvis with acute findings of T12 and L5 compression fractures, known history of L1 compression fracture.  Patient currently resides at [facility].  Discharging on hospice.  Refusing all medications.   HTN Currently not on antihypertensives outpatient   Paroxysmal atrial fibrillation Currently not on any rate controlling medications outpatient.  Discontinue Eliquis  now transitioning to hospice.   CAD HLD Discontinue statin as transitioning to hospice.   COPD/pulmonary fibrosis Continue Advair and Spiriva .  Discontinued Cortef  as she has been refusing oral medication.   GERD Discontinue PPI she been refusing oral medication.   Advanced dementia with behavioral disturbance Adult failure to thrive Palliative care was consulted and followed for assistance with goals of care/medical decision making given overall prognosis poor as patient refusing to eat and take her medications.  Discussed with patient's guardian who agrees for transition to hospice.  Discharging back to [facility] under hospice care.   Discharge Diagnoses:  Principal  Problem:   HCAP (healthcare-associated pneumonia) Active Problems:   COVID  Palliative care by specialist   Counseling and coordination of care   Goals of care, counseling/discussion   DNR (do not resuscitate)    Discharge Instructions  Discharge Instructions     Diet - low sodium heart healthy   Complete by: As directed    Increase activity slowly   Complete by: As directed       Allergies as of 06/07/2024       Reactions   Oxycodone  Itching   Penicillins Rash   Percocet [oxycodone -acetaminophen ] Itching        Medication List     STOP taking these medications    acetaminophen  500 MG tablet Commonly known as: TYLENOL    apixaban  5 MG Tabs tablet Commonly known as: ELIQUIS    atorvastatin  20 MG tablet Commonly known as: LIPITOR   bisacodyl  10 MG suppository Commonly known as: DULCOLAX   cetirizine  5 MG tablet Commonly known as: ZYRTEC    cimetidine 200 MG tablet Commonly known as: TAGAMET   cyanocobalamin  1000 MCG tablet Commonly known as: VITAMIN B12   folic acid  1 MG tablet Commonly known as: FOLVITE    gabapentin  300 MG capsule Commonly known as: NEURONTIN    Geri-Tussin 100 MG/5ML liquid Generic drug: guaiFENesin    hydrocortisone  5 MG tablet Commonly known as: CORTEF    LORazepam  0.5 MG tablet Commonly known as: ATIVAN    ondansetron  4 MG disintegrating tablet Commonly known as: Zofran  ODT   pantoprazole  40 MG tablet Commonly known as: Protonix    QUEtiapine  300 MG tablet Commonly known as: SEROQUEL    senna-docusate 8.6-50 MG tablet Commonly known as: Senokot-S   traMADol  50 MG tablet Commonly known as: ULTRAM        TAKE these medications    albuterol  (2.5 MG/3ML) 0.083% nebulizer solution Commonly known as: PROVENTIL  Take 3 mLs (2.5 mg total) by nebulization every 4 (four) hours as needed for wheezing or shortness of breath.   fluticasone -salmeterol 250-50 MCG/ACT Aepb Commonly known as: ADVAIR Inhale 1 puff into the  lungs in the morning and at bedtime.   Spiriva  HandiHaler 18 MCG inhalation capsule Generic drug: tiotropium Place 1 capsule (18 mcg total) into inhaler and inhale daily.        Contact information for after-discharge care     Destination     [facility]   Service: Skilled Nursing Contact information: [address] [place]   [address] [phone]                    Allergies  Allergen Reactions   Oxycodone  Itching   Penicillins Rash   Percocet [Oxycodone -Acetaminophen ] Itching    Consultations: Palliative care   Procedures/Studies: CT CHEST ABDOMEN PELVIS W CONTRAST Result Date: 06/01/2024 CLINICAL DATA:  Altered mental status.  Generalized abdominal pain. EXAM: CT CHEST, ABDOMEN, AND PELVIS WITH CONTRAST TECHNIQUE: Multidetector CT imaging of the chest, abdomen and pelvis was performed following the standard protocol during bolus administration of intravenous contrast. RADIATION DOSE REDUCTION: This exam was performed according to the departmental dose-optimization program which includes automated exposure control, adjustment of the mA and/or kV according to patient size and/or use of iterative reconstruction technique. CONTRAST:  OMNIPAQUE  IOHEXOL  300 MG/ML  SOLN COMPARISON:  CT dated 03/26/2024. FINDINGS: CT CHEST FINDINGS Cardiovascular: There is no cardiomegaly or pericardial effusion. Mild atherosclerotic calcification of the thoracic aorta. No aneurysmal dilatation or dissection. The origins of the great vessels of the aortic arch and the central pulmonary arteries appear patent. Mediastinum/Nodes: Mildly enlarged subcarinal lymph node measures 11 mm  short axis. The esophagus is grossly unremarkable. No mediastinal fluid collection. Lungs/Pleura: There is diffuse interstitial coarsening and subpleural reticulation consistent with pulmonary fibrosis and interstitial lung disease. There is partial occlusion of the  right lower lobe bronchus, likely related to mucous impaction or aspiration. An endobronchial lesion is not excluded. Patchy area of increased density at the right lung base may represent scarring/fibrosis or developing infiltrate. Interval resolution of the previously seen nodular density at the left lung base. There is no pleural effusion or pneumothorax. Musculoskeletal: Degenerative changes of the spine and osteopenia. There is fracture of the superior aspect of the T12 vertebra, new since the prior CT, likely acute. CT ABDOMEN PELVIS FINDINGS No intra-abdominal free air or free fluid. Hepatobiliary: The liver is unremarkable. No biliary dilatation. The gallbladder is unremarkable Pancreas: Unremarkable. No pancreatic ductal dilatation or surrounding inflammatory changes. Spleen: Normal in size without focal abnormality. Adrenals/Urinary Tract: The adrenal glands unremarkable. Bilateral renal cysts as seen previously. Follow-up left renal lesion as per recommendation of prior CT. There is mild bilateral hydronephrosis, right greater than left, and new since the prior CT. No obstructing stone. The urinary bladder is mildly distended and grossly unremarkable. Stomach/Bowel: There is no bowel obstruction. There is mild distal colonic diverticulosis. No active inflammatory changes. Appendectomy. Vascular/Lymphatic: Moderate aortoiliac atherosclerotic disease. 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 The IVC is unremarkable. No portal venous gas. There is no adenopathy. Reproductive: Hysterectomy. Other: None Musculoskeletal: Relatively similar appearance of L1 compression fracture and greater than 50% loss of vertebral body height with fragmentation of the superior endplate. Approximately 3 mm retropulsion of the superior posterior cortex. There is fracture of the L5 vertebra, new since the prior CT, acute. There is advanced osteopenia. IMPRESSION: 1. Acute fracture of the superior aspect of the T12  vertebra and L5 vertebra. 2. Relatively similar appearance of L1 compression fracture. 3. Partial occlusion of the right lower lobe bronchus, likely related to mucous impaction or aspiration. An endobronchial lesion is not excluded. 4. Patchy area of increased density at the right lung base may represent scarring/fibrosis or developing infiltrate. 5. Mild bilateral hydronephrosis, right greater than left, and new since the prior CT. No obstructing stone. 6.  Aortic Atherosclerosis (ICD10-I70.0). Electronically Signed   By: Vanetta Chou M.D.   On: 06/01/2024 15:40   CT Head Wo Contrast Result Date: 06/01/2024 CLINICAL DATA:  Altered mental status EXAM: CT HEAD WITHOUT CONTRAST TECHNIQUE: Contiguous axial images were obtained from the base of the skull through the vertex without intravenous contrast. RADIATION DOSE REDUCTION: This exam was performed according to the departmental dose-optimization program which includes automated exposure control, adjustment of the mA and/or kV according to patient size and/or use of iterative reconstruction technique. COMPARISON:  CT brain 03/26/2024 FINDINGS: Brain: No acute territorial infarction, hemorrhage or intracranial mass. Atrophy and mild chronic small vessel ischemic changes of the white matter. Stable ventricle size. Vascular: No hyperdense vessels.  Carotid vascular calcification Skull: Normal. Negative for fracture or focal lesion. Sinuses/Orbits: No acute finding. Other: None IMPRESSION: 1. No CT evidence for acute intracranial abnormality. 2. Atrophy and mild chronic small vessel ischemic changes of the white matter. Electronically Signed   By: Luke Bun M.D.   On: 06/01/2024 15:34   DG Chest Portable 1 View Result Date: 06/01/2024 CLINICAL DATA:  Altered level of consciousness EXAM: PORTABLE CHEST 1 VIEW COMPARISON:  03/26/2024 FINDINGS: Single frontal view of the chest demonstrates an unremarkable cardiac silhouette. Chronic areas of scarring and  fibrosis  are seen bilaterally within the lungs, right greater than left. No airspace disease, effusion, or pneumothorax. No acute bony abnormalities. IMPRESSION: 1. Chronic scarring and fibrosis.  No acute airspace disease. Electronically Signed   By: Ozell Daring M.D.   On: 06/01/2024 14:57     Subjective: Patient seen examined bedside, lying in bed.  Remains pleasantly confused.  Continues to have little to no oral intake, refusing her medications.  Seen by palliative care, discussions with guardian and agreeable to transition to comfort measures.  Discharging back to [facility] under hosp[facility] care.  Unable to obtain any further ROS from patient due to her underlying advanced dementia.  No acute overnight events per nursing staff.  Discharge Exam: Vitals:   06/07/24 0402 06/07/24 0857  BP: 130/73   Pulse: 95   Resp: 16   Temp: 97.7 F (36.5 C)   SpO2:  (!) 89%   Vitals:   06/05/24 1900 06/06/24 2020 06/07/24 0402 06/07/24 0857  BP: (!) 168/85 123/63 130/73   Pulse: 95 95 95   Resp:  18 16   Temp: 98.6 F (37 C) (!) 97.4 F (36.3 C) 97.7 F (36.5 C)   TempSrc:   Oral   SpO2:  91%  (!) 89%  Weight:        Physical Exam: GEN: NAD, alert, pleasantly confused and not oriented to person/place/time or situation, chronically ill/elderly in appearance HEENT: NCAT, PERRL, sclera clear, dry mucous membranes PULM: Breath sounds diminished bilateral bases, crackles right base, no wheezing, normal respiratory effort without accessory muscle use; on room air with SpO2 95% at rest CV: RRR w/o M/G/R GI: abd soft, NTND, + BS MSK: no peripheral edema Integumentary: No concerning rashes/lesions/wounds noted on exposed skin surfaces    The results of significant diagnostics from this hospitalization (including imaging, microbiology, ancillary and laboratory) are listed below for reference.     Microbiology: Recent Results (from the past 240 hours)  Blood culture (routine x 2)     Status:  Abnormal   Collection Time: 06/01/24  1:11 PM   Specimen: BLOOD LEFT ARM  Result Value Ref Range Status   Specimen Description   Final    BLOOD LEFT ARM Performed at [facility], [address] [place], [place] [address]    Special Requests   Final    BOTTLES DRAWN AEROBIC AND ANAEROBIC Blood Culture results may not be optimal due to an inadequate volume of blood received in culture bottles Performed at [facility], [address], [place], [place] [address]    Culture  Setup Time (A)  Final    GRAM VARIABLE ROD AEROBIC BOTTLE ONLY CRITICAL RESULT CALLED TO, READ BACK BY AND VERIFIED WITH: PHARMD MARY S on [id number] @1515  by SM    Culture (A)  Final    CORYNEBACTERIUM UREALYTICUM Standardized susceptibility testing for this organism is not available. Performed at [facility], [address], [place], [place] [address]    Report Status 06/06/2024 FINAL  Final  Blood Culture ID Panel (Reflexed)     Status: None   Collection Time: 06/01/24  1:11 PM  Result Value Ref Range Status   Enterococcus faecalis NOT DETECTED NOT DETECTED Final   Enterococcus Faecium NOT DETECTED NOT DETECTED Final   Listeria monocytogenes NOT DETECTED NOT DETECTED Final   Staphylococcus species NOT DETECTED NOT DETECTED Final   Staphylococcus aureus (BCID) NOT DETECTED NOT DETECTED Final   Staphylococcus epidermidis NOT DETECTED  NOT DETECTED Final   Staphylococcus lugdunensis NOT DETECTED NOT DETECTED Final   Streptococcus species NOT DETECTED NOT DETECTED Final   Streptococcus agalactiae NOT DETECTED NOT DETECTED Final   Streptococcus pneumoniae NOT DETECTED NOT DETECTED Final   Streptococcus pyogenes NOT DETECTED NOT DETECTED Final   A.calcoaceticus-baumannii NOT DETECTED NOT DETECTED Final   Bacteroides fragilis NOT DETECTED NOT DETECTED Final   Enterobacterales NOT DETECTED NOT DETECTED Final   Enterobacter cloacae complex NOT DETECTED NOT DETECTED Final   Escherichia  coli NOT DETECTED NOT DETECTED Final   Klebsiella aerogenes NOT DETECTED NOT DETECTED Final   Klebsiella oxytoca NOT DETECTED NOT DETECTED Final   Klebsiella pneumoniae NOT DETECTED NOT DETECTED Final   Proteus species NOT DETECTED NOT DETECTED Final   Salmonella species NOT DETECTED NOT DETECTED Final   Serratia marcescens NOT DETECTED NOT DETECTED Final   Haemophilus influenzae NOT DETECTED NOT DETECTED Final   Neisseria meningitidis NOT DETECTED NOT DETECTED Final   Pseudomonas aeruginosa NOT DETECTED NOT DETECTED Final   Stenotrophomonas maltophilia NOT DETECTED NOT DETECTED Final   Candida albicans NOT DETECTED NOT DETECTED Final   Candida auris NOT DETECTED NOT DETECTED Final   Candida glabrata NOT DETECTED NOT DETECTED Final   Candida krusei NOT DETECTED NOT DETECTED Final   Candida parapsilosis NOT DETECTED NOT DETECTED Final   Candida tropicalis NOT DETECTED NOT DETECTED Final   Cryptococcus neoformans/gattii NOT DETECTED NOT DETECTED Final    Comment: Performed at [facility], [address] [place], [place] [address]  Resp panel by RT-PCR (RSV, Flu A&B, Covid)     Status: Abnormal   Collection Time: 06/01/24  1:20 PM  Result Value Ref Range Status   SARS Coronavirus 2 by RT PCR POSITIVE (A) NEGATIVE Final    Comment: (NOTE) SARS-CoV-2 target nucleic acids are DETECTED.  The SARS-CoV-2 RNA is generally detectable in upper respiratory specimens during the acute phase of infection. Positive results are indicative of the presence of the identified virus, but do not rule out bacterial infection or co-infection with other pathogens not detected by the test. Clinical correlation with patient history and other diagnostic information is necessary to determine patient infection status. The expected result is Negative.  Fact Sheet for Patients: BloggerCourse.com  Fact Sheet for Healthcare  Providers: SeriousBroker.it  This test is not yet approved or cleared by the [place]  FDA and  has been authorized for detection and/or diagnosis of SARS-CoV-2 by FDA under an Emergency Use Authorization (EUA).  This EUA will remain in effect (meaning this test can be used) for the duration of  the COVID-19 declaration under Section 564(b)(1) of the A ct, 21 U.S.C. section 360bbb-3(b)(1), unless the authorization is terminated or revoked sooner.     Influenza A by PCR NEGATIVE NEGATIVE Final   Influenza B by PCR NEGATIVE NEGATIVE Final    Comment: (NOTE) The Xpert Xpress SARS-CoV-2/FLU/RSV plus assay is intended as an aid in the diagnosis of influenza from Nasopharyngeal swab specimens and should not be used as a sole basis for treatment. Nasal washings and aspirates are unacceptable for Xpert Xpress SARS-CoV-2/FLU/RSV testing.  Fact Sheet for Patients: BloggerCourse.com  Fact Sheet for Healthcare Providers: SeriousBroker.it  This test is not yet approved or cleared by the [place]  FDA and has been authorized for detection and/or diagnosis of SARS-CoV-2 by FDA under an Emergency Use Authorization (EUA). This EUA will remain in effect (meaning this test can be used) for the duration of the COVID-19 declaration under  Section 564(b)(1) of the Act, 21 U.S.C. section 360bbb-3(b)(1), unless the authorization is terminated or revoked.     Resp Syncytial Virus by PCR NEGATIVE NEGATIVE Final    Comment: (NOTE) Fact Sheet for Patients: BloggerCourse.com  Fact Sheet for Healthcare Providers: SeriousBroker.it  This test is not yet approved or cleared by the [place]  FDA and has been authorized for detection and/or diagnosis of SARS-CoV-2 by FDA under an Emergency Use Authorization (EUA). This EUA will remain in effect (meaning this test can be  used) for the duration of the COVID-19 declaration under Section 564(b)(1) of the Act, 21 U.S.C. section 360bbb-3(b)(1), unless the authorization is terminated or revoked.  Performed at [facility], [address], [place], [place] [address]   Blood culture (routine x 2)     Status: None   Collection Time: 06/01/24  7:26 PM   Specimen: BLOOD RIGHT HAND  Result Value Ref Range Status   Specimen Description   Final    BLOOD RIGHT HAND Performed at [facility], [address] [place], [place] [address]    Special Requests   Final    BOTTLES DRAWN AEROBIC ONLY Blood Culture results may not be optimal due to an inadequate volume of blood received in culture bottles Performed at [facility], [address], [place], [place] [address]    Culture   Final    NO GROWTH 5 DAYS Performed at [facility], [address], [place], [place] [address]    Report Status 06/07/2024 FINAL  Final     Labs: BNP (last 3 results) Recent Labs    08/20/23 1934  BNP 233.9*   Basic Metabolic Panel: Recent Labs  Lab 06/01/24 1300 06/02/24 0625 06/03/24 0419 06/06/24 0408 06/07/24 0352  NA 143 142 143 144 145  K 4.9 4.0 3.9 3.4* 4.2  CL 105 108 110 108 112*  CO2 25 21* 24 26 21*  GLUCOSE 98 103* 105* 111* 77  BUN 20 19 23 17 21   CREATININE 0.96 0.93 0.90 0.74 0.85  CALCIUM  10.1 9.2 8.9 9.3 9.0  MG  --   --  2.4  --  2.5*  PHOS  --   --  3.0  --  3.3   Liver Function Tests: Recent Labs  Lab 06/01/24 1300 06/03/24 0419  AST 57* 27  ALT 14 7  ALKPHOS 100 91  BILITOT 0.8 0.5  PROT 7.8 5.8*  ALBUMIN 4.0 3.0*   Recent Labs  Lab 06/01/24 1300  LIPASE 15   No results for input(s): AMMONIA in the last 168 hours. CBC: Recent Labs  Lab 06/01/24 1155 06/02/24 0625 06/03/24 0419 06/04/24 0543 06/06/24 0408 06/07/24 0352  WBC 13.7* 13.8* 11.8* 7.6 6.3 6.4  NEUTROABS 11.7*  --   --   --   --   --   HGB 15.1* 11.2* 9.6* 9.8*  10.8* 10.2*  HCT 47.3* 36.9 29.6* 31.0* 33.7* 32.7*  MCV 98.5 97.4 100.3* 100.3* 93.4 99.1  PLT 173 161 165 168 221 221   Cardiac Enzymes: No results for input(s): CKTOTAL, CKMB, CKMBINDEX, TROPONINI in the last 168 hours. BNP: Invalid input(s): POCBNP CBG: No results for input(s): GLUCAP in the last 168 hours. D-Dimer No results for input(s): DDIMER in the last 72 hours. Hgb A1c No results for input(s): HGBA1C in the last 72 hours. Lipid Profile No results for input(s): CHOL, HDL, LDLCALC, TRIG, CHOLHDL, LDLDIRECT in the last 72 hours. Thyroid  function studies  No results for input(s): TSH, T4TOTAL, T3FREE, THYROIDAB in the last 72 hours.  Invalid input(s): FREET3 Anemia work up No results for input(s): VITAMINB12, FOLATE, FERRITIN, TIBC, IRON , RETICCTPCT in the last 72 hours. Urinalysis    Component Value Date/Time   COLORURINE YELLOW 06/06/2024 1730   APPEARANCEUR HAZY (A) 06/06/2024 1730   APPEARANCEUR Hazy 01/07/2015 1812   LABSPEC 1.016 06/06/2024 1730   LABSPEC 1.014 01/07/2015 1812   PHURINE 6.0 06/06/2024 1730   GLUCOSEU NEGATIVE 06/06/2024 1730   GLUCOSEU Negative 01/07/2015 1812   HGBUR LARGE (A) 06/06/2024 1730   BILIRUBINUR NEGATIVE 06/06/2024 1730   BILIRUBINUR Negative 01/07/2015 1812   KETONESUR NEGATIVE 06/06/2024 1730   PROTEINUR NEGATIVE 06/06/2024 1730   NITRITE NEGATIVE 06/06/2024 1730   LEUKOCYTESUR NEGATIVE 06/06/2024 1730   LEUKOCYTESUR Negative 01/07/2015 1812   Sepsis Labs Recent Labs  Lab 06/03/24 0419 06/04/24 0543 06/06/24 0408 06/07/24 0352  WBC 11.8* 7.6 6.3 6.4   Microbiology Recent Results (from the past 240 hours)  Blood culture (routine x 2)     Status: Abnormal   Collection Time: 06/01/24  1:11 PM   Specimen: BLOOD LEFT ARM  Result Value Ref Range Status   Specimen Description   Final    BLOOD LEFT ARM Performed at [facility], [address], [place], [place]  [address]    Special Requests   Final    BOTTLES DRAWN AEROBIC AND ANAEROBIC Blood Culture results may not be optimal due to an inadequate volume of blood received in culture bottles Performed at [facility], [address], [place], [place] [address]    Culture  Setup Time (A)  Final    GRAM VARIABLE ROD AEROBIC BOTTLE ONLY CRITICAL RESULT CALLED TO, READ BACK BY AND VERIFIED WITH: PHARMD MARY S on [id number] @1515  by SM    Culture (A)  Final    CORYNEBACTERIUM UREALYTICUM Standardized susceptibility testing for this organism is not available. Performed at [facility], [address], [place], [place] [address]    Report Status 06/06/2024 FINAL  Final  Blood Culture ID Panel (Reflexed)     Status: None   Collection Time: 06/01/24  1:11 PM  Result Value Ref Range Status   Enterococcus faecalis NOT DETECTED NOT DETECTED Final   Enterococcus Faecium NOT DETECTED NOT DETECTED Final   Listeria monocytogenes NOT DETECTED NOT DETECTED Final   Staphylococcus species NOT DETECTED NOT DETECTED Final   Staphylococcus aureus (BCID) NOT DETECTED NOT DETECTED Final   Staphylococcus epidermidis NOT DETECTED NOT DETECTED Final   Staphylococcus lugdunensis NOT DETECTED NOT DETECTED Final   Streptococcus species NOT DETECTED NOT DETECTED Final   Streptococcus agalactiae NOT DETECTED NOT DETECTED Final   Streptococcus pneumoniae NOT DETECTED NOT DETECTED Final   Streptococcus pyogenes NOT DETECTED NOT DETECTED Final   A.calcoaceticus-baumannii NOT DETECTED NOT DETECTED Final   Bacteroides fragilis NOT DETECTED NOT DETECTED Final   Enterobacterales NOT DETECTED NOT DETECTED Final   Enterobacter cloacae complex NOT DETECTED NOT DETECTED Final   Escherichia coli NOT DETECTED NOT DETECTED Final   Klebsiella aerogenes NOT DETECTED NOT DETECTED Final   Klebsiella oxytoca NOT DETECTED NOT DETECTED Final   Klebsiella pneumoniae NOT DETECTED NOT DETECTED Final   Proteus species NOT  DETECTED NOT DETECTED Final   Salmonella species NOT DETECTED NOT DETECTED Final   Serratia marcescens NOT DETECTED NOT DETECTED Final   Haemophilus influenzae NOT DETECTED NOT DETECTED Final   Neisseria meningitidis NOT DETECTED NOT DETECTED  Final   Pseudomonas aeruginosa NOT DETECTED NOT DETECTED Final   Stenotrophomonas maltophilia NOT DETECTED NOT DETECTED Final   Candida albicans NOT DETECTED NOT DETECTED Final   Candida auris NOT DETECTED NOT DETECTED Final   Candida glabrata NOT DETECTED NOT DETECTED Final   Candida krusei NOT DETECTED NOT DETECTED Final   Candida parapsilosis NOT DETECTED NOT DETECTED Final   Candida tropicalis NOT DETECTED NOT DETECTED Final   Cryptococcus neoformans/gattii NOT DETECTED NOT DETECTED Final    Comment: Performed at [facility], [address], [place], [place] [address]  Resp panel by RT-PCR (RSV, Flu A&B, Covid)     Status: Abnormal   Collection Time: 06/01/24  1:20 PM  Result Value Ref Range Status   SARS Coronavirus 2 by RT PCR POSITIVE (A) NEGATIVE Final    Comment: (NOTE) SARS-CoV-2 target nucleic acids are DETECTED.  The SARS-CoV-2 RNA is generally detectable in upper respiratory specimens during the acute phase of infection. Positive results are indicative of the presence of the identified virus, but do not rule out bacterial infection or co-infection with other pathogens not detected by the test. Clinical correlation with patient history and other diagnostic information is necessary to determine patient infection status. The expected result is Negative.  Fact Sheet for Patients: BloggerCourse.com  Fact Sheet for Healthcare Providers: SeriousBroker.it  This test is not yet approved or cleared by the [place]  FDA and  has been authorized for detection and/or diagnosis of SARS-CoV-2 by FDA under an Emergency Use Authorization (EUA).  This EUA will remain in effect (meaning  this test can be used) for the duration of  the COVID-19 declaration under Section 564(b)(1) of the A ct, 21 U.S.C. section 360bbb-3(b)(1), unless the authorization is terminated or revoked sooner.     Influenza A by PCR NEGATIVE NEGATIVE Final   Influenza B by PCR NEGATIVE NEGATIVE Final    Comment: (NOTE) The Xpert Xpress SARS-CoV-2/FLU/RSV plus assay is intended as an aid in the diagnosis of influenza from Nasopharyngeal swab specimens and should not be used as a sole basis for treatment. Nasal washings and aspirates are unacceptable for Xpert Xpress SARS-CoV-2/FLU/RSV testing.  Fact Sheet for Patients: BloggerCourse.com  Fact Sheet for Healthcare Providers: SeriousBroker.it  This test is not yet approved or cleared by the [place]  FDA and has been authorized for detection and/or diagnosis of SARS-CoV-2 by FDA under an Emergency Use Authorization (EUA). This EUA will remain in effect (meaning this test can be used) for the duration of the COVID-19 declaration under Section 564(b)(1) of the Act, 21 U.S.C. section 360bbb-3(b)(1), unless the authorization is terminated or revoked.     Resp Syncytial Virus by PCR NEGATIVE NEGATIVE Final    Comment: (NOTE) Fact Sheet for Patients: BloggerCourse.com  Fact Sheet for Healthcare Providers: SeriousBroker.it  This test is not yet approved or cleared by the [place]  FDA and has been authorized for detection and/or diagnosis of SARS-CoV-2 by FDA under an Emergency Use Authorization (EUA). This EUA will remain in effect (meaning this test can be used) for the duration of the COVID-19 declaration under Section 564(b)(1) of the Act, 21 U.S.C. section 360bbb-3(b)(1), unless the authorization is terminated or revoked.  Performed at [facility], [address] [place], [place] [address]   Blood culture  (routine x 2)     Status: None   Collection Time: 06/01/24  7:26 PM   Specimen: BLOOD RIGHT HAND  Result Value Ref Range Status   Specimen  Description   Final    BLOOD RIGHT HAND Performed at [facility], [address], [place], [place] [address]    Special Requests   Final    BOTTLES DRAWN AEROBIC ONLY Blood Culture results may not be optimal due to an inadequate volume of blood received in culture bottles Performed at [facility], [address] [place], [place] [address]    Culture   Final    NO GROWTH 5 DAYS Performed at [facility], [address], [place], [place] [address]    Report Status 06/07/2024 FINAL  Final     Time coordinating discharge: Over 30 minutes  SIGNED:   Camellia PARAS [place], DO  [facility] 06/07/2024, 10:31 AM

## 2024-06-07 NOTE — Progress Notes (Signed)
 Tried calling facility 5 times with no response, unable to leave voicemail because inbox is full.

## 2024-06-07 NOTE — NC FL2 (Signed)
 [place]  MEDICAID FL2 LEVEL OF CARE FORM     IDENTIFICATION  Patient Name: Tracy Dickerson Birthdate: [birth year] Sex: female Admission Date (Current Location): 06/01/2024  [facility] and [place] Number:  Producer, television/film/video and Address:  [facility],  [address] [place], [place] [address]      Provider Number: [id number]  Attending Physician Name and Address:  [place], Eric J, DO  Relative Name and Phone Number:       Current Level of Care: Hospital Recommended Level of Care: Skilled Nursing Facility Prior Approval Number:    Date Approved/Denied:   PASRR Number: [id number]  Discharge Plan: SNF    Current Diagnoses: Patient Active Problem List   Diagnosis Date Noted   Counseling and coordination of care 06/04/2024   Goals of care, counseling/discussion 06/04/2024   DNR (do not resuscitate) 06/04/2024   COVID 06/02/2024   Palliative care by specialist 06/02/2024   HCAP (healthcare-associated pneumonia) 06/01/2024   Cough 03/26/2024   Fever 03/26/2024   Atrial fibrillation, chronic (HCC) 03/26/2024   Sepsis due to pneumonia (HCC) 03/26/2024   Atypical pneumonia 11/21/2023   ILD (interstitial lung disease) (HCC) 11/21/2023   Dementia with behavioral disturbance (HCC) 11/21/2023   Low serum cortisol level 09/02/2023   Headache 08/30/2023   Vitamin D  deficiency 08/28/2023   Dizziness 08/25/2023   Iron  deficiency anemia 08/23/2023   Iron  deficiency anemia due to chronic blood loss 08/21/2023   Syncope 08/20/2023   Normocytic anemia 08/20/2023   Obesity (BMI 30-39.9) 08/20/2023   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/2023   Vitamin B12 deficiency 06/20/2023   Carotid stenosis 06/20/2023   Dementia (HCC) 06/19/2023   Generalized weakness 06/18/2023   Acute on chronic anemia 06/18/2023   H/O: upper GI bleed 06/18/2023   Chronic anticoagulation 06/18/2023   Infestation by bed bug 06/18/2023   CKD stage 3a, GFR 45-59 ml/min (HCC)  01/07/2023   Overweight (BMI 25.0-29.9) 01/06/2023   Type 2 diabetes mellitus with hyperlipidemia (HCC) 01/06/2023   COPD (chronic obstructive pulmonary disease) (HCC) 01/06/2023   Hypokalemia 01/06/2023   UGIB (upper gastrointestinal bleed) 01/05/2023   Unsatisfactory living conditions 01/05/2023   Constipation    UTI (urinary tract infection) 08/13/2020   HLD (hyperlipidemia) 08/13/2020   Acute renal failure superimposed on stage 3a chronic kidney disease (HCC) 08/13/2020   PAF (paroxysmal atrial fibrillation) (HCC) 08/13/2020   HTN (hypertension) 08/13/2020   Dehydration    Dehydration, moderate    Acute renal failure (HCC) 04/22/2020   Hyperkalemia    Septic shock (HCC)    Sepsis (HCC) 03/13/2018   Acute kidney injury superimposed on chronic kidney disease (HCC) 02/13/2018   Closed extra-articular fracture of distal tibia 02/02/2018   Community acquired bilateral lower lobe pneumonia 10/29/2017   Orthostatic hypotension 01/26/2017   Lung nodule 05/30/2014   Sleep apnea 05/10/2014   Barrett's esophagus 05/13/2007   Malignant neoplasm of breast (female) (HCC) 06/01/1997    Orientation RESPIRATION BLADDER Height & Weight     Self, Place  Normal Incontinent Weight: 78.4 kg Height:     BEHAVIORAL SYMPTOMS/MOOD NEUROLOGICAL BOWEL NUTRITION STATUS      Continent Diet  AMBULATORY STATUS COMMUNICATION OF NEEDS Skin   Extensive Assist Verbally Normal                       Personal Care Assistance Level of Assistance  Bathing, Feeding, Dressing Bathing Assistance: Limited assistance Feeding assistance: Limited assistance Dressing Assistance: Limited  assistance     Functional Limitations Info             SPECIAL CARE FACTORS FREQUENCY  PT (By licensed PT), OT (By licensed OT)     PT Frequency: 5x weekly OT Frequency: 5x weekly            Contractures Contractures Info: Not present    Additional Factors Info  Code Status, Allergies, Psychotropic Code  Status Info: FULL Allergies Info: Oxycodone , Penicillins, Percocet (Oxycodone -acetaminophen  Psychotropic Info: Quetiapine , Lorazepam          Current Medications (06/07/2024):  This is the current hospital active medication list Current Facility-Administered Medications  Medication Dose Route Frequency Provider Last Rate Last Admin   acetaminophen  (TYLENOL ) tablet 650 mg  650 mg Oral Q6H PRN Zella, Mir M, MD   650 mg at 06/05/24 2155   Or   acetaminophen  (TYLENOL ) suppository 650 mg  650 mg Rectal Q6H PRN Zella, Mir M, MD       albuterol  (PROVENTIL ) (2.5 MG/3ML) 0.083% nebulizer solution 2.5 mg  2.5 mg Nebulization Q2H PRN [place], Eric J, DO       ampicillin -sulbactam (UNASYN ) 1.5 g in sodium chloride  0.9 % 100 mL IVPB  1.5 g Intravenous Q6H Britta Eva HERO, RPH 200 mL/hr at 06/07/24 0628 1.5 g at 06/07/24 9371   Chlorhexidine  Gluconate Cloth 2 % PADS 6 each  6 each Topical Daily [place], Eric J, DO   6 each at 06/05/24 1523   fluticasone  furoate-vilanterol (BREO ELLIPTA ) 200-25 MCG/ACT 1 puff  1 puff Inhalation Daily Zella, Mir M, MD   1 puff at 06/07/24 0856   guaiFENesin  (ROBITUSSIN) 100 MG/5ML liquid 5 mL  5 mL Oral Q4H PRN Zella Katha HERO, MD       haloperidol  lactate (HALDOL ) injection 2 mg  2 mg Intramuscular Q6H PRN [place], Eric J, DO       Or   haloperidol  lactate (HALDOL ) injection 2 mg  2 mg Intravenous Q6H PRN [place], Eric J, DO   2 mg at 06/05/24 1836   ondansetron  (ZOFRAN ) tablet 4 mg  4 mg Oral Q6H PRN Zella, Mir M, MD       Or   ondansetron  (ZOFRAN ) injection 4 mg  4 mg Intravenous Q6H PRN Zella, Mir M, MD       polyethylene glycol (MIRALAX  / GLYCOLAX ) packet 17 g  17 g Oral Daily PRN [place], Eric J, DO       umeclidinium bromide  (INCRUSE ELLIPTA ) 62.5 MCG/ACT 1 puff  1 puff Inhalation Daily Zella, Mir M, MD   1 puff at 06/07/24 9143     Discharge Medications: Please see discharge summary for a list of discharge medications. Medication  List       STOP taking these medications     acetaminophen  500 MG tablet Commonly known as: TYLENOL     apixaban  5 MG Tabs tablet Commonly known as: ELIQUIS     atorvastatin  20 MG tablet Commonly known as: LIPITOR    bisacodyl  10 MG suppository Commonly known as: DULCOLAX    cetirizine  5 MG tablet Commonly known as: ZYRTEC     cimetidine 200 MG tablet Commonly known as: TAGAMET    cyanocobalamin  1000 MCG tablet Commonly known as: VITAMIN B12    folic acid  1 MG tablet Commonly known as: FOLVITE     gabapentin  300 MG capsule Commonly known as: NEURONTIN     Geri-Tussin 100 MG/5ML liquid Generic drug: guaiFENesin     hydrocortisone  5 MG tablet Commonly known as: CORTEF   LORazepam  0.5 MG tablet Commonly known as: ATIVAN     ondansetron  4 MG disintegrating tablet Commonly known as: Zofran  ODT    pantoprazole  40 MG tablet Commonly known as: Protonix     QUEtiapine  300 MG tablet Commonly known as: SEROQUEL     senna-docusate 8.6-50 MG tablet Commonly known as: Senokot-S    traMADol  50 MG tablet Commonly known as: ULTRAM            TAKE these medications     albuterol  (2.5 MG/3ML) 0.083% nebulizer solution Commonly known as: PROVENTIL  Take 3 mLs (2.5 mg total) by nebulization every 4 (four) hours as needed for wheezing or shortness of breath.    fluticasone -salmeterol 250-50 MCG/ACT Aepb Commonly known as: ADVAIR Inhale 1 puff into the lungs in the morning and at bedtime.    Spiriva  HandiHaler 18 MCG inhalation capsule Generic drug: tiotropium Place 1 capsule (18 mcg total) into inhaler and inhale daily.          Relevant Imaging Results:  Relevant Lab Results:   Additional Information SSN [id number]  Tracy Glenys DASEN, RN

## 2024-06-07 NOTE — Plan of Care (Signed)

## 2024-06-07 NOTE — Progress Notes (Signed)
 I accompanied Shatha's RN to speak with the family about her guardian's decision to focus on comfort measures only.  The family understands the decision and did not have any support needs at this time.  The family is focused on encouraging her to eat and giving her care and love.

## 2024-06-07 NOTE — Care Management Important Message (Signed)
 Important Message  Patient Details IM Letter not given due to Comfort Care. Name: Tracy Dickerson MRN: [id number] Date of Birth: 1941-05-19   Important Message Given:  Yes - Medicare IM     Melba Ates 06/07/2024, 12:10 PM

## 2024-06-07 NOTE — TOC Transition Note (Signed)
 Transition of Care [facility]) - Discharge Note   Patient Details  Name: Tracy Dickerson MRN: [id number] Date of Birth: 05/12/1941  [facility]) CM/SW Contact:  Doneta Glenys DASEN, RN Phone Number: 06/07/2024, 3:28 PM   Clinical Narrative:    Per MD patient is ready for discharge to [facility] with [facility]); Patient, Tracy Dickerson (Legal [phone], nurse and [facility] notified of discharge;Discharge packet on shadow chart includes facesheet, medical necessity, signed DNR, discharge summary and FL2. Nurse will call report prior to discharge [phone] Rm [phone]. [facility] has been called for transport and is #15 on list. IP CM signing off.    Final next level of care: Memory Care [facility]) Barriers to Discharge: Barriers Resolved   Patient Goals and CMS Choice     Choice offered to / list presented to : NA [place] ownership interest in [facility]provided to:: Parent NA    Discharge Placement                Patient to be transferred to facility by: [facility] Name of family member notified: Tracy Dickerson (Legal Guardian)  [phone] Patient and family notified of of transfer: 06/07/24  Discharge Plan and Services Additional resources added to the After Visit Summary for   In-house Referral: NA Discharge Planning Services: CM Consult            DME Arranged: N/A DME Agency: NA                  Social Drivers of Health (SDOH) Interventions SDOH Screenings   Food Insecurity: No Food Insecurity (11/21/2023)  Housing: Low Risk  (11/21/2023)  Transportation Needs: Patient Unable To Answer (06/02/2024)  Utilities: Not At Risk (11/21/2023)  Financial Resource Strain: Low Risk  (02/04/2023)   [facility] from [facility]  Physical Activity: Inactive (07/16/2021)   Received from [facility]  Social Connections: Patient Unable To Answer (11/21/2023)  Stress: No Stress Concern Present  (07/16/2021)   Received from [facility]  Tobacco Use: Medium Risk (06/04/2024)  Health Literacy: Medium Risk (07/16/2021)   Received from [facility]     Readmission Risk Interventions    03/27/2024   11:14 AM  Readmission Risk Prevention Plan  Transportation Screening Complete  Medication Review (RN Care Manager) Complete  PCP or Specialist appointment within 3-5 days of discharge Complete  HRI or Home Care Consult Complete  SW Recovery Care/Counseling Consult Complete  Palliative Care Screening Not Applicable  Skilled Nursing Facility Not Applicable

## 2024-06-08 ENCOUNTER — Other Ambulatory Visit

## 2024-06-25 ENCOUNTER — Inpatient Hospital Stay (HOSPITAL_COMMUNITY)
Admission: EM | Admit: 2024-06-25 | Discharge: 2024-06-30 | DRG: 871 | Disposition: A | Discharge status: Hospice | Source: Skilled Nursing Facility | Attending: Internal Medicine | Admitting: Internal Medicine

## 2024-06-25 ENCOUNTER — Emergency Department (HOSPITAL_COMMUNITY)

## 2024-06-25 DIAGNOSIS — M4854XA Collapsed vertebra, not elsewhere classified, thoracic region, initial encounter for fracture: Secondary | ICD-10-CM | POA: Diagnosis present

## 2024-06-25 DIAGNOSIS — E785 Hyperlipidemia, unspecified: Secondary | ICD-10-CM | POA: Diagnosis present

## 2024-06-25 DIAGNOSIS — Z87891 Personal history of nicotine dependence: Secondary | ICD-10-CM

## 2024-06-25 DIAGNOSIS — J8410 Pulmonary fibrosis, unspecified: Secondary | ICD-10-CM | POA: Diagnosis present

## 2024-06-25 DIAGNOSIS — I10 Essential (primary) hypertension: Secondary | ICD-10-CM | POA: Diagnosis present

## 2024-06-25 DIAGNOSIS — I480 Paroxysmal atrial fibrillation: Secondary | ICD-10-CM | POA: Diagnosis present

## 2024-06-25 DIAGNOSIS — L03312 Cellulitis of back [any part except buttock]: Secondary | ICD-10-CM | POA: Diagnosis present

## 2024-06-25 DIAGNOSIS — A419 Sepsis, unspecified organism: Secondary | ICD-10-CM | POA: Diagnosis present

## 2024-06-25 DIAGNOSIS — Z85118 Personal history of other malignant neoplasm of bronchus and lung: Secondary | ICD-10-CM

## 2024-06-25 DIAGNOSIS — Z7951 Long term (current) use of inhaled steroids: Secondary | ICD-10-CM

## 2024-06-25 DIAGNOSIS — L03317 Cellulitis of buttock: Secondary | ICD-10-CM | POA: Diagnosis present

## 2024-06-25 DIAGNOSIS — R6520 Severe sepsis without septic shock: Secondary | ICD-10-CM | POA: Diagnosis present

## 2024-06-25 DIAGNOSIS — A4101 Sepsis due to Methicillin susceptible Staphylococcus aureus: Secondary | ICD-10-CM | POA: Diagnosis present

## 2024-06-25 DIAGNOSIS — E872 Acidosis, unspecified: Secondary | ICD-10-CM | POA: Diagnosis present

## 2024-06-25 DIAGNOSIS — E119 Type 2 diabetes mellitus without complications: Secondary | ICD-10-CM | POA: Diagnosis present

## 2024-06-25 DIAGNOSIS — L89150 Pressure ulcer of sacral region, unstageable: Secondary | ICD-10-CM | POA: Diagnosis present

## 2024-06-25 DIAGNOSIS — L89326 Pressure-induced deep tissue damage of left buttock: Secondary | ICD-10-CM | POA: Diagnosis present

## 2024-06-25 DIAGNOSIS — Z885 Allergy status to narcotic agent status: Secondary | ICD-10-CM

## 2024-06-25 DIAGNOSIS — Z8701 Personal history of pneumonia (recurrent): Secondary | ICD-10-CM

## 2024-06-25 DIAGNOSIS — E876 Hypokalemia: Secondary | ICD-10-CM | POA: Diagnosis not present

## 2024-06-25 DIAGNOSIS — Z515 Encounter for palliative care: Secondary | ICD-10-CM

## 2024-06-25 DIAGNOSIS — Z853 Personal history of malignant neoplasm of breast: Secondary | ICD-10-CM

## 2024-06-25 DIAGNOSIS — J449 Chronic obstructive pulmonary disease, unspecified: Secondary | ICD-10-CM | POA: Diagnosis present

## 2024-06-25 DIAGNOSIS — Z880 Allergy status to penicillin: Secondary | ICD-10-CM

## 2024-06-25 DIAGNOSIS — Z90710 Acquired absence of both cervix and uterus: Secondary | ICD-10-CM

## 2024-06-25 DIAGNOSIS — Z79899 Other long term (current) drug therapy: Secondary | ICD-10-CM

## 2024-06-25 DIAGNOSIS — Z66 Do not resuscitate: Secondary | ICD-10-CM | POA: Diagnosis present

## 2024-06-25 DIAGNOSIS — F01C11 Vascular dementia, severe, with agitation: Secondary | ICD-10-CM | POA: Diagnosis not present

## 2024-06-25 DIAGNOSIS — F0390 Unspecified dementia without behavioral disturbance: Secondary | ICD-10-CM | POA: Diagnosis present

## 2024-06-25 DIAGNOSIS — Z4800 Encounter for change or removal of nonsurgical wound dressing: Secondary | ICD-10-CM

## 2024-06-25 DIAGNOSIS — R339 Retention of urine, unspecified: Secondary | ICD-10-CM

## 2024-06-25 DIAGNOSIS — R627 Adult failure to thrive: Secondary | ICD-10-CM | POA: Diagnosis present

## 2024-06-25 DIAGNOSIS — R532 Functional quadriplegia: Secondary | ICD-10-CM | POA: Diagnosis present

## 2024-06-25 LAB — CBC WITH DIFFERENTIAL/PLATELET
Abs Immature Granulocytes: 0.33 K/uL — ABNORMAL HIGH (ref 0.00–0.07)
Basophils Absolute: 0.1 K/uL (ref 0.0–0.1)
Basophils Relative: 0 %
Eosinophils Absolute: 0 K/uL (ref 0.0–0.5)
Eosinophils Relative: 0 %
HCT: 34.7 % — ABNORMAL LOW (ref 36.0–46.0)
Hemoglobin: 10.8 g/dL — ABNORMAL LOW (ref 12.0–15.0)
Immature Granulocytes: 1 %
Lymphocytes Relative: 1 %
Lymphs Abs: 0.4 K/uL — ABNORMAL LOW (ref 0.7–4.0)
MCH: 31.1 pg (ref 26.0–34.0)
MCHC: 31.1 g/dL (ref 30.0–36.0)
MCV: 100 fL (ref 80.0–100.0)
Monocytes Absolute: 0.9 K/uL (ref 0.1–1.0)
Monocytes Relative: 3 %
Neutro Abs: 25.1 K/uL — ABNORMAL HIGH (ref 1.7–7.7)
Neutrophils Relative %: 95 %
Platelets: 213 K/uL (ref 150–400)
RBC: 3.47 MIL/uL — ABNORMAL LOW (ref 3.87–5.11)
RDW: 15.4 % (ref 11.5–15.5)
WBC: 26.8 K/uL — ABNORMAL HIGH (ref 4.0–10.5)
nRBC: 0 % (ref 0.0–0.2)

## 2024-06-25 LAB — COMPREHENSIVE METABOLIC PANEL WITH GFR
ALT: 10 U/L (ref 0–44)
AST: 28 U/L (ref 15–41)
Albumin: 3.4 g/dL — ABNORMAL LOW (ref 3.5–5.0)
Alkaline Phosphatase: 161 U/L — ABNORMAL HIGH (ref 38–126)
Anion gap: 13 (ref 5–15)
BUN: 33 mg/dL — ABNORMAL HIGH (ref 8–23)
CO2: 24 mmol/L (ref 22–32)
Calcium: 9.6 mg/dL (ref 8.9–10.3)
Chloride: 102 mmol/L (ref 98–111)
Creatinine, Ser: 1.36 mg/dL — ABNORMAL HIGH (ref 0.44–1.00)
GFR, Estimated: 39 mL/min — ABNORMAL LOW (ref >60)
Glucose, Bld: 119 mg/dL — ABNORMAL HIGH (ref 70–99)
Potassium: 5 mmol/L (ref 3.5–5.1)
Sodium: 138 mmol/L (ref 135–145)
Total Bilirubin: 0.8 mg/dL (ref 0.0–1.2)
Total Protein: 7.2 g/dL (ref 6.5–8.1)

## 2024-06-25 LAB — BLOOD GAS, VENOUS
Acid-Base Excess: 2.4 mmol/L — ABNORMAL HIGH (ref 0.0–2.0)
Bicarbonate: 28.8 mmol/L — ABNORMAL HIGH (ref 20.0–28.0)
O2 Saturation: 37 %
Patient temperature: 37
pCO2, Ven: 51 mmHg (ref 44–60)
pH, Ven: 7.36 (ref 7.25–7.43)
pO2, Ven: <31 mmHg — CL (ref 32–45)

## 2024-06-25 LAB — URINALYSIS, W/ REFLEX TO CULTURE (INFECTION SUSPECTED)
Bacteria, UA: NONE SEEN
Bilirubin Urine: NEGATIVE
Glucose, UA: NEGATIVE mg/dL
Ketones, ur: NEGATIVE mg/dL
Leukocytes,Ua: NEGATIVE
Nitrite: NEGATIVE
Protein, ur: NEGATIVE mg/dL
Specific Gravity, Urine: 1.015 (ref 1.005–1.030)
pH: 5.5 (ref 5.0–8.0)

## 2024-06-25 LAB — I-STAT CG4 LACTIC ACID, ED: Lactic Acid, Venous: 2.6 mmol/L (ref 0.5–1.9)

## 2024-06-25 LAB — PROTIME-INR
INR: 2.2 — ABNORMAL HIGH (ref 0.8–1.2)
Prothrombin Time: 25.3 s — ABNORMAL HIGH (ref 11.4–15.2)

## 2024-06-25 LAB — TROPONIN T, HIGH SENSITIVITY: Troponin T High Sensitivity: 138 ng/L (ref 0–19)

## 2024-06-25 LAB — PRO BRAIN NATRIURETIC PEPTIDE: Pro Brain Natriuretic Peptide: 1039 pg/mL — ABNORMAL HIGH (ref <300.0)

## 2024-06-25 MED ORDER — SODIUM CHLORIDE 0.9 % IV SOLN
1.0000 g | Freq: Once | INTRAVENOUS | Status: AC
  Administered 2024-06-25: 1 g via INTRAVENOUS
  Filled 2024-06-25: qty 10

## 2024-06-25 MED ORDER — SODIUM CHLORIDE 0.9 % IV BOLUS
500.0000 mL | Freq: Once | INTRAVENOUS | Status: AC
  Administered 2024-06-25: 500 mL via INTRAVENOUS

## 2024-06-25 MED ORDER — CEFEPIME HCL 2 G IV SOLR
2.0000 g | Freq: Once | INTRAVENOUS | Status: DC
  Filled 2024-06-25: qty 12.5

## 2024-06-25 MED ORDER — ACETAMINOPHEN 650 MG RE SUPP: 650.0000 mg | Freq: Four times a day (QID) | RECTAL | Status: DC | PRN

## 2024-06-25 MED ORDER — SODIUM CHLORIDE 0.9 % IV BOLUS
500.0000 mL | Freq: Once | INTRAVENOUS | Status: AC
Start: 2024-06-25 — End: 2024-06-25
  Administered 2024-06-25: 500 mL via INTRAVENOUS

## 2024-06-25 MED ORDER — ONDANSETRON HCL 4 MG/2ML IJ SOLN: 4.0000 mg | Freq: Four times a day (QID) | INTRAMUSCULAR | Status: DC | PRN

## 2024-06-25 MED ORDER — ONDANSETRON HCL 4 MG PO TABS: 4.0000 mg | ORAL_TABLET | Freq: Four times a day (QID) | ORAL | Status: DC | PRN

## 2024-06-25 MED ORDER — VANCOMYCIN HCL 1500 MG/300ML IV SOLN
1500.0000 mg | Freq: Once | INTRAVENOUS | Status: AC
  Administered 2024-06-26: 1500 mg via INTRAVENOUS
  Filled 2024-06-25: qty 300

## 2024-06-25 MED ORDER — SODIUM CHLORIDE 0.9 % IV BOLUS
500.0000 mL | Freq: Once | INTRAVENOUS | Status: AC
  Administered 2024-06-26: 500 mL via INTRAVENOUS

## 2024-06-25 MED ORDER — IOHEXOL 300 MG/ML  SOLN
80.0000 mL | Freq: Once | INTRAMUSCULAR | Status: AC | PRN
  Administered 2024-06-25: 80 mL via INTRAVENOUS

## 2024-06-25 MED ORDER — VANCOMYCIN HCL IN DEXTROSE 1-5 GM/200ML-% IV SOLN
1000.0000 mg | Freq: Once | INTRAVENOUS | Status: DC
Start: 2024-06-25 — End: 2024-06-25

## 2024-06-25 MED ORDER — ENOXAPARIN SODIUM 40 MG/0.4ML IJ SOSY
40.0000 mg | PREFILLED_SYRINGE | Freq: Every day | INTRAMUSCULAR | Status: DC
  Administered 2024-06-26 – 2024-06-29 (×5): 40 mg via SUBCUTANEOUS
  Filled 2024-06-25 (×5): qty 0.4

## 2024-06-25 MED ORDER — ACETAMINOPHEN 325 MG PO TABS
650.0000 mg | ORAL_TABLET | Freq: Four times a day (QID) | ORAL | Status: DC | PRN
  Administered 2024-06-26 (×2): 650 mg via ORAL
  Filled 2024-06-25 (×2): qty 2

## 2024-06-25 NOTE — ED Provider Notes (Signed)
 [facility] Provider Note   CSN: [id number] Arrival date & time: 06/25/24  1551     Patient presents with: Weakness   [facility] is a 83 y.o. female.    Weakness    Patient has a history of acid reflux hypertension heart disease cancer diabetes, atrial fibrillation, COPD, dementia.  Patient resides in nursing facility.  Patient was recently admitted to the [facility] on September 9.  She was discharged on September 15.  Patient was admitted at that time for confusion in the setting of advanced dementia.  Patient was found to have possible mucous plugging versus pneumonia.  Patient was treated with antibiotics.  There is mention for transitioning to [facility].  Patient reportedly was sent to the ED today for evaluation of fever.  Staff at the facility said patient spiked a fever of 104.  When she was getting up off the toilet she had some weakness.  Patient herself is not able to clearly provide any history.  She does answer my questions but denies any complaints.  She is not sure why she is here in the management  Prior to Admission medications   Medication Sig Start Date End Date Taking? Authorizing Provider  nystatin  (MYCOSTATIN /NYSTOP ) powder SMARTSIG:1 Application Topical 2-3 Times Daily 06/25/24  Yes [provider]  ondansetron  (ZOFRAN -ODT) 4 MG disintegrating tablet Take 4 mg by mouth every 8 (eight) hours as needed. 06/10/24  Yes [provider]  albuterol  (PROVENTIL ) (2.5 MG/3ML) 0.083% nebulizer solution Take 3 mLs (2.5 mg total) by nebulization every 4 (four) hours as needed for wheezing or shortness of breath. 03/27/24   Barbarann Nest, MD  fluticasone -salmeterol (ADVAIR) 250-50 MCG/ACT AEPB Inhale 1 puff into the lungs in the morning and at bedtime.    [provider]  SPIRIVA  HANDIHALER 18 MCG inhalation capsule Place 1 capsule (18 mcg total) into inhaler and inhale daily. 02/09/22   Woods, Jaclyn M,  PA-C    Allergies: Oxycodone , Penicillins, and Percocet [oxycodone -acetaminophen ]    Review of Systems  Neurological:  Positive for weakness.    Updated Vital Signs BP (!) 135/57   Pulse (!) 115   Temp 98.8 F (37.1 C) (Oral)   Resp (!) 23   SpO2 95%   Physical Exam Vitals and nursing note reviewed.  Constitutional:      Appearance: She is well-developed. She is ill-appearing.  HENT:     Head: Normocephalic and atraumatic.     Right Ear: External ear normal.     Left Ear: External ear normal.  Eyes:     General: No scleral icterus.       Right eye: No discharge.        Left eye: No discharge.     Conjunctiva/sclera: Conjunctivae normal.  Neck:     Trachea: No tracheal deviation.  Cardiovascular:     Rate and Rhythm: Normal rate and regular rhythm.  Pulmonary:     Effort: Pulmonary effort is normal. No respiratory distress.     Breath sounds: Normal breath sounds. No stridor. No wheezing or rales.  Abdominal:     General: Bowel sounds are normal. There is no distension.     Palpations: Abdomen is soft.     Tenderness: There is no abdominal tenderness. There is no guarding or rebound.  Musculoskeletal:        General: No tenderness or deformity.     Cervical back: Neck supple.     Right lower leg:  Edema present.     Left lower leg: Edema present.  Skin:    General: Skin is warm and dry.     Findings: No rash.  Neurological:     General: No focal deficit present.     Mental Status: She is alert.     Cranial Nerves: No cranial nerve deficit, dysarthria or facial asymmetry.     Sensory: No sensory deficit.     Motor: Weakness present. No abnormal muscle tone or seizure activity.     Coordination: Coordination normal.     Comments: Generalized weakness although patient is able to move both upper and lower extremities, difficulty lifting them completely off the bed, no facial droop  Psychiatric:        Mood and Affect: Mood normal.     (all labs ordered are  listed, but only abnormal results are displayed) Labs Reviewed  COMPREHENSIVE METABOLIC PANEL WITH GFR - Abnormal; Notable for the following components:      Result Value   Glucose, Bld 119 (*)    BUN 33 (*)    Creatinine, Ser 1.36 (*)    Albumin 3.4 (*)    Alkaline Phosphatase 161 (*)    GFR, Estimated 39 (*)    All other components within normal limits  CBC WITH DIFFERENTIAL/PLATELET - Abnormal; Notable for the following components:   WBC 26.8 (*)    RBC 3.47 (*)    Hemoglobin 10.8 (*)    HCT 34.7 (*)    Neutro Abs 25.1 (*)    Lymphs Abs 0.4 (*)    Abs Immature Granulocytes 0.33 (*)    All other components within normal limits  PROTIME-INR - Abnormal; Notable for the following components:   Prothrombin Time 25.3 (*)    INR 2.2 (*)    All other components within normal limits  URINALYSIS, W/ REFLEX TO CULTURE (INFECTION SUSPECTED) - Abnormal; Notable for the following components:   Hgb urine dipstick TRACE (*)    All other components within normal limits  PRO BRAIN NATRIURETIC PEPTIDE - Abnormal; Notable for the following components:   Pro Brain Natriuretic Peptide 1,039.0 (*)    All other components within normal limits  BLOOD GAS, VENOUS - Abnormal; Notable for the following components:   pO2, Ven <31 (*)    Bicarbonate 28.8 (*)    Acid-Base Excess 2.4 (*)    All other components within normal limits  I-STAT CG4 LACTIC ACID, ED - Abnormal; Notable for the following components:   Lactic Acid, Venous 2.6 (*)    All other components within normal limits  TROPONIN T, HIGH SENSITIVITY - Abnormal; Notable for the following components:   Troponin T High Sensitivity 138 (*)    All other components within normal limits  CULTURE, BLOOD (ROUTINE X 2)  CULTURE, BLOOD (ROUTINE X 2)  I-STAT CG4 LACTIC ACID, ED  TROPONIN T, HIGH SENSITIVITY    EKG: EKG Interpretation Date/Time:  Friday June 25 2024 16:28:50 EDT Ventricular Rate:  120 PR Interval:  156 QRS Duration:  102 QT  Interval:  308 QTC Calculation: 436 R Axis:   -24  Text Interpretation: Sinus tachycardia Atrial premature complexes Borderline left axis deviation Abnormal R-wave progression, late transition Nonspecific T abnormalities, lateral leads Confirmed by Randol Simmonds [phone]) on 06/25/2024 4:46:03 PM  Radiology: CT ABDOMEN PELVIS W CONTRAST Result Date: 06/25/2024 CLINICAL DATA:  Sepsis fever weakness EXAM: CT ABDOMEN AND PELVIS WITH CONTRAST TECHNIQUE: Multidetector CT imaging of the abdomen and pelvis was performed using  the standard protocol following bolus administration of intravenous contrast. RADIATION DOSE REDUCTION: This exam was performed according to the departmental dose-optimization program which includes automated exposure control, adjustment of the mA and/or kV according to patient size and/or use of iterative reconstruction technique. CONTRAST:  80mL OMNIPAQUE  IOHEXOL  300 MG/ML  SOLN COMPARISON:  CT 06/01/2024, 03/26/2024, 11/01/2023, 08/11/2023 FINDINGS: Lower chest: Lung bases demonstrate chronic interstitial lung disease with fibrosis and bronchiectasis. Scarring at the right base. No acute superimposed airspace disease. Coronary vascular calcification. Mild cardiomegaly. Hepatobiliary: No focal liver abnormality is seen. No gallstones, gallbladder wall thickening, or biliary dilatation. Pancreas: Unremarkable. No pancreatic ductal dilatation or surrounding inflammatory changes. Fatty atrophy Spleen: Normal in size without focal abnormality. Adrenals/Urinary Tract: Adrenal glands are within normal limits. Multiple renal cysts for which no specific imaging follow-up is recommended. Presumed exophytic complex cyst mid left kidney series 2, image 31 stable compared to prior, no specific imaging follow-up is recommended. Decreased left hydronephrosis compared to prior CT. Mild right hydronephrosis persists but is also slightly diminished. No hydroureter or obstructing stone. Mild thick-walled appearance  of the urinary bladder. Stomach/Bowel: Stomach within normal limits. No dilated small bowel. No acute bowel wall thickening. Moderate retained feces at the rectum. Vascular/Lymphatic: Aortic atherosclerosis. 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 No suspicious lymph nodes. Reproductive: Hysterectomy.  No suspicious adnexal mass Other: Negative for pelvic effusion or free air Musculoskeletal: Interim development of subcutaneous edema and soft tissue stranding within the left gluteal soft tissues, this tracks towards the left posterior perianal region but there is no discrete abscess or definitive fistula. Findings are suggestive of cellulitis. Chronic compression deformity at L1 with retropulsion. Redemonstrated T12 fracture involves the superior endplate and upper aspect of the vertebra. Mild interval loss of height of the vertebral body compared to the prior CT, estimated at 20% centrally. There is mild paravertebral edema at T12. Redemonstrated fracture of the L5 vertebra, involves both the anterior and posterior aspect of vertebra as well as the superior endplate. Fracture may involve the right pedicle of L5 as well. Diffuse loss of the vertebral body height, similar compared to prior. About 5 mm retropulsion of the upper aspect of the vertebral body. There is continued paravertebral soft tissue edema. IMPRESSION: 1. Interim development of subcutaneous edema and soft tissue stranding within the left gluteal soft tissues, this tracks towards the left posterior perianal region but there is no discrete abscess or definitive fistula. Findings are suggestive of cellulitis. 2. Residual mild bilateral right greater than left hydronephrosis but overall decreased compared to the CT from September. Interval slight thick-walled appearance of the bladder, correlate for cystitis. 3. Chronic severe L1 fracture. Redemonstrated T12 fracture with slight interval increase in loss of vertebral body height and  continued paravertebral edema. Redemonstrated burst type morphology L5 fracture, about 5 mm retropulsion of the upper vertebral body. Fracture may involve the right pedicle. No progressive loss of vertebral body height but continued paravertebral edema 4. Decreased left hydronephrosis compared to prior CT. Mild right hydronephrosis persists but is also slightly diminished. No hydroureter or obstructing stone. Mild thick-walled appearance of the urinary bladder, correlate for cystitis. 5. Aortic atherosclerosis. 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 Recommend follow-up ultrasound every 3 years. (Ref.: J Vasc Surg. 2018; 67:2-77 and J Am Coll Radiol 2013;10(10):789-794.) 6. 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 Aortic Atherosclerosis (ICD10-I70.0). Electronically Signed   By: Luke Bun M.D.   On: 06/25/2024 21:55   DG Chest  Port 1 View Result Date: 06/25/2024 CLINICAL DATA:  Possible sepsis EXAM: PORTABLE CHEST 1 VIEW COMPARISON:  06/01/2024, chest CT 03/26/2024, 06/01/2024, radiograph 03/26/2024 FINDINGS: Hypoventilatory changes. Postsurgical changes of the right lung with faintly visible chain sutures. Diffuse reticular opacity consistent with chronic interstitial lung disease and fibrosis. No superimposed acute airspace disease, effusion or pneumothorax. Stable cardiomediastinal silhouette. Clips in the left axilla IMPRESSION: Chronic interstitial lung disease and fibrosis. No superimposed acute airspace disease. Electronically Signed   By: Luke Bun M.D.   On: 06/25/2024 17:31     Procedures   Medications Ordered in the ED  sodium chloride  0.9 % bolus 500 mL (has no administration in time range)  vancomycin  (VANCOCIN ) IVPB 1000 mg/200 mL premix (has no administration in time range)  ceFEPIme  (MAXIPIME ) 2 g in sodium chloride  0.9 % 100 mL IVPB (has no administration in time range)  sodium chloride  0.9 % bolus 500 mL (0 mLs  Intravenous Stopped 06/25/24 1900)  sodium chloride  0.9 % bolus 500 mL (0 mLs Intravenous Stopped 06/25/24 2039)  cefTRIAXone  (ROCEPHIN ) 1 g in sodium chloride  0.9 % 100 mL IVPB (0 g Intravenous Stopped 06/25/24 2032)  iohexol  (OMNIPAQUE ) 300 MG/ML solution 80 mL (80 mLs Intravenous Contrast Given 06/25/24 2117)    Clinical Course as of 06/25/24 2300  Fri Jun 25, 2024  1820 Blood gas, venous (at [facility] and [facility])(!!) No acidosis noted lactic acid level elevated [JK]  1838 I-Stat Lactic Acid, ED(!!) [JK]  1839 CBC with Differential(!) Leukocytosis noted [JK]  1839 Chest x-ray without signs of pneumonia [JK]  1906 In-N-Out catheter showed greater than 2000 cc of urine consistent with urinary retention [JK]  2044 Urinalysis, w/ Reflex to Culture (Infection Suspected) -Urine, Catheterized(!) Urinalysis without definite UTI. [JK]  2045 CBC with Differential(!) Leukocytosis noted.  Troponin and BNP elevated [JK]  2252 CT scan shows subcutaneous edema and stranding in the left gluteal soft tissues suggestive of cellulitis but no evidence of abscess [JK]  2253 Bladder does show some thickening in but no evidence of obstruction [JK]  2253 Patient has chronic L1 fracture and T12 fracture [JK]    Clinical Course User Index [JK] Randol Simmonds, MD                                 Medical Decision Making Problems Addressed: Cellulitis of buttock: acute illness or injury that poses a threat to life or bodily functions Urinary retention: acute illness or injury that poses a threat to life or bodily functions  Amount and/or Complexity of Data Reviewed Labs: ordered. Decision-making details documented in ED Course. Radiology: ordered and independent interpretation performed.  Risk Prescription drug management. Decision regarding hospitalization.   Patient presented to the ED for evaluation of fever and weakness.  Patient's ED workup is notable for significant leukocytosis.  Patient had white count of 26.8.   She also was noted to have lactic acidosis of 2.6.  Patient also noted to have elevated troponin as well as BNP although no overt pulmonary edema noted on chest x-ray.  While in the ED patient had a Foley catheter placed as she was noted to have urinary retention with initial greater than 2000 cc of urine output when the catheter was placed.  CT scan was ordered to help identify source of infection.  No intra-abdominal source noted however patient does appear to have findings consistent with a cellulitis.  Patient has been started on fluids.  Will consult the medical service for admission.  Case discussed with Dr. Dena regarding admission      Final diagnoses:  Cellulitis of buttock  Urinary retention    ED Discharge Orders     None          Randol Simmonds, MD 06/25/24 2304

## 2024-06-25 NOTE — ED Triage Notes (Signed)
 BIBA from [facility]- Staff reports that pt spiked a fever of 104 when they got her up off the toilet, also weakness. Pt has a sacral ulcer undergoing wound care. 97.8 t 120 hr 118/72 30 rr 29 etco2

## 2024-06-25 NOTE — Progress Notes (Signed)
 [facility] ED 3 [facility] liaison note:     This patient is a current hospice patient with [facility], admitted with a terminal diagnosis of cerebrovascular disease.     We will continue to follow for any discharge planning needs and to coordinate continuation of hospice care.   Please don't hesitate to call with any Hospice related questions or concerns.    Thank you for the opportunity to participate in this patient's care. Hunter Seip, Charity fundraiser, BSN [facility] Liaison [phone]

## 2024-06-25 NOTE — ED Notes (Signed)
 US  PIV placed.

## 2024-06-25 NOTE — ED Notes (Signed)
 In/out cath w/2600 cc out.

## 2024-06-26 ENCOUNTER — Other Ambulatory Visit: Payer: Self-pay

## 2024-06-26 DIAGNOSIS — A419 Sepsis, unspecified organism: Secondary | ICD-10-CM | POA: Diagnosis not present

## 2024-06-26 DIAGNOSIS — R6520 Severe sepsis without septic shock: Secondary | ICD-10-CM | POA: Diagnosis not present

## 2024-06-26 LAB — BLOOD CULTURE ID PANEL (REFLEXED) - BCID2

## 2024-06-26 LAB — TROPONIN T, HIGH SENSITIVITY
Troponin T High Sensitivity: 123 ng/L (ref 0–19)
Troponin T High Sensitivity: 132 ng/L (ref 0–19)
Troponin T High Sensitivity: 125 ng/L (ref 0–19)

## 2024-06-26 LAB — I-STAT CG4 LACTIC ACID, ED: Lactic Acid, Venous: 1 mmol/L (ref 0.5–1.9)

## 2024-06-26 MED ORDER — SODIUM CHLORIDE 0.9 % IV SOLN
2.0000 g | INTRAVENOUS | Status: DC
  Administered 2024-06-26: 2 g via INTRAVENOUS
  Filled 2024-06-26: qty 20

## 2024-06-26 MED ORDER — VANCOMYCIN HCL 1250 MG/250ML IV SOLN: 1250.0000 mg | INTRAVENOUS | Status: DC

## 2024-06-26 MED ORDER — CHLORHEXIDINE GLUCONATE CLOTH 2 % EX PADS
6.0000 | MEDICATED_PAD | Freq: Every day | CUTANEOUS | Status: DC
  Administered 2024-06-26 – 2024-06-30 (×5): 6 via TOPICAL

## 2024-06-26 MED ORDER — COLLAGENASE 250 UNIT/GM EX OINT
TOPICAL_OINTMENT | Freq: Every day | CUTANEOUS | Status: DC
  Administered 2024-06-29 – 2024-06-30 (×2): 1 via TOPICAL
  Filled 2024-06-26: qty 30

## 2024-06-26 MED ORDER — CEFAZOLIN SODIUM-DEXTROSE 2-4 GM/100ML-% IV SOLN
2.0000 g | Freq: Three times a day (TID) | INTRAVENOUS | Status: DC
  Administered 2024-06-26 – 2024-06-30 (×12): 2 g via INTRAVENOUS
  Filled 2024-06-26 (×12): qty 100

## 2024-06-26 NOTE — Progress Notes (Signed)
 PHARMACY - PHYSICIAN COMMUNICATION CRITICAL VALUE ALERT - BLOOD CULTURE IDENTIFICATION (BCID)  [facility] is an 83 y.o. female who presented to [facility] on 06/25/2024 with a chief complaint of weakness  Assessment:   Bcx 2/4 (1/2 sets) MSSA Pt on ceftriaxone  and vancomycin  for sacral cellulitis  Name of physician (or Provider) Contacted: Dr. Sonjia  Current antibiotics: vancomycin , ceftriaxone   Changes to prescribed antibiotics recommended: cefazolin  2 g IV q8h, auto ID consult  Results for orders placed or performed during the hospital encounter of 06/25/24  Blood Culture ID Panel (Reflexed) (Collected: 06/25/2024  6:00 PM)  Result Value Ref Range   Enterococcus faecalis NOT DETECTED NOT DETECTED   Enterococcus Faecium NOT DETECTED NOT DETECTED   Listeria monocytogenes NOT DETECTED NOT DETECTED   Staphylococcus species DETECTED (A) NOT DETECTED   Staphylococcus aureus (BCID) DETECTED (A) NOT DETECTED   Staphylococcus epidermidis NOT DETECTED NOT DETECTED   Staphylococcus lugdunensis NOT DETECTED NOT DETECTED   Streptococcus species NOT DETECTED NOT DETECTED   Streptococcus agalactiae NOT DETECTED NOT DETECTED   Streptococcus pneumoniae NOT DETECTED NOT DETECTED   Streptococcus pyogenes NOT DETECTED NOT DETECTED   A.calcoaceticus-baumannii NOT DETECTED NOT DETECTED   Bacteroides fragilis NOT DETECTED NOT DETECTED   Enterobacterales NOT DETECTED NOT DETECTED   Enterobacter cloacae complex NOT DETECTED NOT DETECTED   Escherichia coli NOT DETECTED NOT DETECTED   Klebsiella aerogenes NOT DETECTED NOT DETECTED   Klebsiella oxytoca NOT DETECTED NOT DETECTED   Klebsiella pneumoniae NOT DETECTED NOT DETECTED   Proteus species NOT DETECTED NOT DETECTED   Salmonella species NOT DETECTED NOT DETECTED   Serratia marcescens NOT DETECTED NOT DETECTED   Haemophilus influenzae NOT DETECTED NOT DETECTED   Neisseria meningitidis NOT DETECTED NOT DETECTED   Pseudomonas aeruginosa NOT  DETECTED NOT DETECTED   Stenotrophomonas maltophilia NOT DETECTED NOT DETECTED   Candida albicans NOT DETECTED NOT DETECTED   Candida auris NOT DETECTED NOT DETECTED   Candida glabrata NOT DETECTED NOT DETECTED   Candida krusei NOT DETECTED NOT DETECTED   Candida parapsilosis NOT DETECTED NOT DETECTED   Candida tropicalis NOT DETECTED NOT DETECTED   Cryptococcus neoformans/gattii NOT DETECTED NOT DETECTED   Meth resistant mecA/C and MREJ NOT DETECTED NOT DETECTED    Stefano MARLA Bologna, PharmD, BCPS Clinical Pharmacist 06/26/2024 12:01 PM

## 2024-06-26 NOTE — Progress Notes (Signed)
 Same day note  [facility] is a 83 y.o. female with medical history significant of breast cancer, lung cancer, hypertension, hyperlipidemia, atrial fibrillation, dementia, COPD presented to the hospital from skilled nursing facility with fever.  Of note patient was recently discharged from hospital after being treated for worsening dementia confusion and possible pneumonia.  On this admission patient was fatigued confused tachycardic and febrile.  Initial labs creatinine 1.3 baseline 0.8, WBC 26.8, hemoglobin 10.8, INR 2.2, BNP 1039, lactic acid 2.6, urinalysis negative for infection.  Patient underwent CT abdomen pelvis which showed possible cellulitis.  Chest x-ray showed chronic interstitial lung disease.  Patient was then admitted hospital for further evaluation and treatment.  Patient seen and examined at bedside.  Patient was admitted to the hospital for sacral cellulitis  At the time of my evaluation, patient complains of pain in the back.  Physical examination reveals elderly female with pain on the legs and sacral cellulitis.  Laboratory data and imaging was reviewed  Assessment and Plan.  Sepsis most likely secondary to sacral cellulitis Presented confusion fever and lactic acidosis on presentation.  Lactic acid improved after IV fluids.  CT abdomen pelvis showed subcutaneous edema and soft tissue swelling consistent with cellulitis of gluteal tissues.  Continue vancomycin  and Rocephin . Follow cultures.   Elevated troponin-likely related to sepsis.  Will continue to trend.  No chest pain or EKG changes.   Cognitive impairment-continue to monitor    Paroxysmal A-fib-currently not on rate control or anticoagulation.  Slightly tachycardic.   Essential hypertension-not on any antihypertensives.  Latest blood pressure 131/48   Spinal compression fractures-continue to monitor   History of COPD/pulmonary fibrosis-continue bronchodilators.    Goals of care-patient has engaged  palliative care in the past and remains a DNR.  Looks like being followed by hospice    No Charge  Signed,  Vernal Anselm Alstrom, MD [facility]

## 2024-06-26 NOTE — ED Notes (Signed)
 400cc's of urine removed from patient foley bag

## 2024-06-26 NOTE — ED Notes (Signed)
 Patient resting in bed breathing eyes closed chest rising and falling.

## 2024-06-26 NOTE — ED Notes (Signed)
 ED TO INPATIENT HANDOFF REPORT  Name/Age/Gender Tracy Dickerson 83 y.o. female  Code Status    Code Status Orders  (From admission, onward)           Start     Ordered   06/25/24 2303  Do not attempt resuscitation (DNR)- Limited -Do Not Intubate (DNI)  Continuous       Question Answer Comment  If pulseless and not breathing No CPR or chest compressions.   In Pre-Arrest Conditions (Patient Is Breathing and Has A Pulse) Do not intubate. Provide all appropriate non-invasive medical interventions. Avoid ICU transfer unless indicated or required.   Consent: Discussion documented in EHR or advanced directives reviewed      06/25/24 2303           Code Status History     Date Active Date Inactive Code Status Order ID Comments User Context   06/07/2024 1026 06/07/2024 2325 Do not attempt resuscitation (DNR) - Comfort care [id number]  [place], Eric J, DO Inpatient   06/04/2024 1200 06/07/2024 1026 Limited: Do not attempt resuscitation (DNR) -DNR-LIMITED -Do Not Intubate/DNI  [id number]  Clayton Tinnie ORN, DO Inpatient   06/01/2024 1640 06/04/2024 1200 Full Code [id number]  Zella Katha HERO, MD ED   03/26/2024 2058 03/28/2024 2139 Full Code [id number]  Jackee Zebedee BROCKS, MD ED   11/21/2023 1925 12/31/2023 2114 Full Code [id number]  Hilma Rankins, MD ED   08/20/2023 1922 09/03/2023 2305 Full Code [id number]  Hilma Rankins, MD ED   06/18/2023 2002 06/20/2023 2134 Full Code [id number]  Cleatus Delayne GAILS, MD ED   01/05/2023 1433 01/07/2023 2024 Full Code [id number]  Eldonna Elspeth PARAS, MD ED   08/13/2020 1542 08/15/2020 1944 Full Code [id number]  Hilma Rankins, MD ED   04/22/2020 2123 04/23/2020 2039 Full Code [id number]  Tobie Calix, MD Inpatient   03/13/2018 2020 03/20/2018 1619 Full Code [id number]  Lonzo Pollen, MD ED   02/13/2018 2026 02/16/2018 1637 Full Code [id number]  Salary, Nemiah BIRCH, MD Inpatient   02/02/2018 2159 02/03/2018 2049 Full Code [id number]  Edie Norleen PARAS, MD Inpatient   10/29/2017 1816 10/31/2017 1816 DNR [id number]   Yisroel Sleight, MD ED   01/26/2017 2250 01/27/2017 1427 Full Code [id number]  Runell Redia PARAS, MD ED       Home/SNF/Other Nursing Home  Chief Complaint Sepsis [facility]) [A41.9]  Level of Care/Admitting Diagnosis ED Disposition     ED Disposition  Admit   Condition  --   Comment  Hospital Area: [facility] [[id number]]  Level of Care: Telemetry [5]  Admit to tele based on following criteria: Eval of Syncope  May admit patient to [facility] or [name] if equivalent level of care is available:: No  Covid Evaluation: Asymptomatic - no recent exposure (last 10 days) testing not required  Diagnosis: Sepsis [facility]) [[id number]]  Admitting Physician: DENA CHARLESTON [[id number]]  Attending Physician: DENA CHARLESTON [phone]  Certification:: I certify this patient will need inpatient services for at least 2 midnights  Expected Medical Readiness: 06/28/2024          Medical History Past Medical History:  Diagnosis Date   Cancer (HCC)    Breast and lung   Diabetes mellitus without complication (HCC)    GERD (gastroesophageal reflux disease)    Heart disease    Hypertension     Allergies Allergies  Allergen Reactions   Oxycodone  Itching   Penicillins Rash   Percocet [Oxycodone -Acetaminophen ] Itching    IV Location/Drains/Wounds Patient Lines/Drains/Airways Status  Active Line/Drains/Airways     Name Placement date Placement time Site Days   Peripheral IV 06/25/24 20 G 2.5 Anterior;Proximal;Right Forearm 06/25/24  1759  Forearm  1   Urethral Catheter Ranell Skibinski, RN Latex 16 Fr. 06/25/24  2159  Latex  1            Labs/Imaging Results for orders placed or performed during the hospital encounter of 06/25/24 (from the past 48 hours)  Troponin T, High Sensitivity     Status: Abnormal   Collection Time: 06/25/24 12:30 AM  Result Value Ref Range   Troponin T High Sensitivity 123 (HH) 0 - 19 ng/L    Comment: Critical value noted. Value is consistent  with previously reported and called value  (NOTE) Biotin concentrations > 1000 ng/mL falsely decrease TnT results.  Serial cardiac troponin measurements are suggested.  Refer to the Links section for chest pain algorithms and additional  guidance. Performed at [facility], [address], [place], [place] [address]   Comprehensive metabolic panel     Status: Abnormal   Collection Time: 06/25/24  6:00 PM  Result Value Ref Range   Sodium 138 135 - 145 mmol/L   Potassium 5.0 3.5 - 5.1 mmol/L   Chloride 102 98 - 111 mmol/L   CO2 24 22 - 32 mmol/L   Glucose, Bld 119 (H) 70 - 99 mg/dL    Comment: Glucose reference range applies only to samples taken after fasting for at least 8 hours.   BUN 33 (H) 8 - 23 mg/dL   Creatinine, Ser 8.63 (H) 0.44 - 1.00 mg/dL   Calcium  9.6 8.9 - 10.3 mg/dL   Total Protein 7.2 6.5 - 8.1 g/dL   Albumin 3.4 (L) 3.5 - 5.0 g/dL   AST 28 15 - 41 U/L   ALT 10 0 - 44 U/L   Alkaline Phosphatase 161 (H) 38 - 126 U/L   Total Bilirubin 0.8 0.0 - 1.2 mg/dL   GFR, Estimated 39 (L) >60 mL/min    Comment: (NOTE) Calculated using the CKD-EPI Creatinine Equation (2021)    Anion gap 13 5 - 15    Comment: Performed at [facility], [address] [place], [place] [address]  CBC with Differential     Status: Abnormal   Collection Time: 06/25/24  6:00 PM  Result Value Ref Range   WBC 26.8 (H) 4.0 - 10.5 K/uL   RBC 3.47 (L) 3.87 - 5.11 MIL/uL   Hemoglobin 10.8 (L) 12.0 - 15.0 g/dL   HCT 65.2 (L) 63.9 - 53.9 %   MCV 100.0 80.0 - 100.0 fL   MCH 31.1 26.0 - 34.0 pg   MCHC 31.1 30.0 - 36.0 g/dL   RDW 84.5 88.4 - 84.4 %   Platelets 213 150 - 400 K/uL   nRBC 0.0 0.0 - 0.2 %   Neutrophils Relative % 95 %   Neutro Abs 25.1 (H) 1.7 - 7.7 K/uL   Lymphocytes Relative 1 %   Lymphs Abs 0.4 (L) 0.7 - 4.0 K/uL   Monocytes Relative 3 %   Monocytes Absolute 0.9 0.1 - 1.0 K/uL   Eosinophils Relative 0 %   Eosinophils Absolute 0.0 0.0 - 0.5  K/uL   Basophils Relative 0 %   Basophils Absolute 0.1 0.0 - 0.1 K/uL   Immature Granulocytes 1 %   Abs Immature Granulocytes 0.33 (H) 0.00 - 0.07 K/uL    Comment: Performed at [facility], [address]  [place], [place] [address]  Protime-INR     Status: Abnormal   Collection Time: 06/25/24  6:00 PM  Result Value Ref Range   Prothrombin Time 25.3 (H) 11.4 - 15.2 seconds   INR 2.2 (H) 0.8 - 1.2    Comment: (NOTE) INR goal varies based on device and disease states. Performed at [facility], [address], [place], [place] [address]   Blood Culture (routine x 2)     Status: None (Preliminary result)   Collection Time: 06/25/24  6:00 PM   Specimen: BLOOD RIGHT ARM  Result Value Ref Range   Specimen Description      BLOOD RIGHT ARM Performed at [facility], [address] [place], [place] [address]    Special Requests      BOTTLES DRAWN AEROBIC AND ANAEROBIC Blood Culture adequate volume Performed at [facility], [address], [place], [place] [address]    Culture PENDING    Report Status PENDING   Troponin T, High Sensitivity     Status: Abnormal   Collection Time: 06/25/24  6:00 PM  Result Value Ref Range   Troponin T High Sensitivity 138 (HH) 0 - 19 ng/L    Comment: Critical Value, Read Back and verified with Berrier, C RN @ [phone] 06/25/24 CAL (NOTE) Biotin concentrations > 1000 ng/mL falsely decrease TnT results.  Serial cardiac troponin measurements are suggested.  Refer to the Links section for chest pain algorithms and additional  guidance. Performed at [facility], [address], [place], [place] [address]   Pro Brain natriuretic peptide     Status: Abnormal   Collection Time: 06/25/24  6:00 PM  Result Value Ref Range   Pro Brain Natriuretic Peptide 1,039.0 (H) <300.0 pg/mL    Comment: (NOTE) Age Group        Cut-Points    Interpretation  < 50 years     450 pg/mL        NT-proBNP > 450 pg/mL indicates                                ADHF is likely              50 to 75 years  900 pg/mL      NT-proBNP > 900 pg/mL indicates          ADHF is likely  > 75 years      1800 pg/mL     NT-proBNP > 1800 pg/mL indicates          ADHF is likely                           All ages    Results between       Indeterminate. Further clinical             300 and the cut-   information is needed to determine            point for age group   if ADHF is present.  Elecsys proBNP II/ Elecsys proBNP II STAT           Cut-Point                       Interpretation  300 pg/mL                    NT-proBNP <300pg/mL indicates                             ADHF is not likely  Performed at [facility], [address], [place], [place] [address]   Blood gas, venous (at [facility])     Status: Abnormal   Collection Time: 06/25/24  6:00 PM  Result Value Ref Range   pH, Ven 7.36 7.25 - 7.43   pCO2, Ven 51 44 - 60 mmHg   pO2, Ven <31 (LL) 32 - 45 mmHg    Comment: CRITICAL RESULT CALLED TO, READ BACK BY AND VERIFIED WITH: KYM LOPES, RN 1820 06/25/24 BY ALONSO CORDS    Bicarbonate 28.8 (H) 20.0 - 28.0 mmol/L   Acid-Base Excess 2.4 (H) 0.0 - 2.0 mmol/L   O2 Saturation 37 %   Patient temperature 37.0     Comment: Performed at [facility], [address], [place], [place] [address]  I-Stat Lactic Acid, ED     Status: Abnormal   Collection Time: 06/25/24  6:19 PM  Result Value Ref Range   Lactic Acid, Venous 2.6 (HH) 0.5 - 1.9 mmol/L   Comment NOTIFIED PHYSICIAN   Urinalysis, w/ Reflex to Culture (Infection Suspected) -Urine, Catheterized     Status: Abnormal   Collection Time: 06/25/24  7:20 PM  Result Value Ref Range   Specimen Source URINE, CATHETERIZED    Color, Urine YELLOW YELLOW   APPearance CLEAR CLEAR   Specific Gravity, Urine 1.015 1.005 - 1.030   pH 5.5 5.0 - 8.0    Glucose, UA NEGATIVE NEGATIVE mg/dL   Hgb urine dipstick TRACE (A) NEGATIVE   Bilirubin Urine NEGATIVE NEGATIVE   Ketones, ur NEGATIVE NEGATIVE mg/dL   Protein, ur NEGATIVE NEGATIVE mg/dL   Nitrite NEGATIVE NEGATIVE   Leukocytes,Ua NEGATIVE NEGATIVE   Squamous Epithelial / HPF 0-5 0 - 5 /HPF   WBC, UA 0-5 0 - 5 WBC/hpf    Comment: Reflex urine culture not performed if WBC <=10, OR if Squamous epithelial cells >5. If Squamous epithelial cells >5, suggest recollection.   RBC / HPF 6-10 0 - 5 RBC/hpf   Bacteria, UA NONE SEEN NONE SEEN   Mucus PRESENT     Comment: Performed at [facility], [address] [place], [place] [address]  I-Stat Lactic Acid, ED     Status: None   Collection Time: 06/26/24 12:34 AM  Result Value Ref Range   Lactic Acid, Venous 1.0 0.5 - 1.9 mmol/L   CT ABDOMEN PELVIS W CONTRAST Result Date: 06/25/2024 CLINICAL DATA:  Sepsis fever weakness EXAM: CT ABDOMEN AND PELVIS WITH CONTRAST TECHNIQUE: Multidetector CT imaging of the abdomen and pelvis was performed using the standard protocol following bolus administration of intravenous contrast. RADIATION DOSE REDUCTION: This exam was performed according to the departmental dose-optimization program which includes automated exposure control, adjustment of the mA and/or kV according to patient size and/or use of iterative reconstruction technique. CONTRAST:  80mL OMNIPAQUE  IOHEXOL  300 MG/ML  SOLN COMPARISON:  CT 06/01/2024, 03/26/2024, 11/01/2023, 08/11/2023  FINDINGS: Lower chest: Lung bases demonstrate chronic interstitial lung disease with fibrosis and bronchiectasis. Scarring at the right base. No acute superimposed airspace disease. Coronary vascular calcification. Mild cardiomegaly. Hepatobiliary: No focal liver abnormality is seen. No gallstones, gallbladder wall thickening, or biliary dilatation. Pancreas: Unremarkable. No pancreatic ductal dilatation or surrounding inflammatory changes. Fatty atrophy  Spleen: Normal in size without focal abnormality. Adrenals/Urinary Tract: Adrenal glands are within normal limits. Multiple renal cysts for which no specific imaging follow-up is recommended. Presumed exophytic complex cyst mid left kidney series 2, image 31 stable compared to prior, no specific imaging follow-up is recommended. Decreased left hydronephrosis compared to prior CT. Mild right hydronephrosis persists but is also slightly diminished. No hydroureter or obstructing stone. Mild thick-walled appearance of the urinary bladder. Stomach/Bowel: Stomach within normal limits. No dilated small bowel. No acute bowel wall thickening. Moderate retained feces at the rectum. Vascular/Lymphatic: Aortic atherosclerosis. 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 No suspicious lymph nodes. Reproductive: Hysterectomy.  No suspicious adnexal mass Other: Negative for pelvic effusion or free air Musculoskeletal: Interim development of subcutaneous edema and soft tissue stranding within the left gluteal soft tissues, this tracks towards the left posterior perianal region but there is no discrete abscess or definitive fistula. Findings are suggestive of cellulitis. Chronic compression deformity at L1 with retropulsion. Redemonstrated T12 fracture involves the superior endplate and upper aspect of the vertebra. Mild interval loss of height of the vertebral body compared to the prior CT, estimated at 20% centrally. There is mild paravertebral edema at T12. Redemonstrated fracture of the L5 vertebra, involves both the anterior and posterior aspect of vertebra as well as the superior endplate. Fracture may involve the right pedicle of L5 as well. Diffuse loss of the vertebral body height, similar compared to prior. About 5 mm retropulsion of the upper aspect of the vertebral body. There is continued paravertebral soft tissue edema. IMPRESSION: 1. Interim development of subcutaneous edema and soft tissue stranding within  the left gluteal soft tissues, this tracks towards the left posterior perianal region but there is no discrete abscess or definitive fistula. Findings are suggestive of cellulitis. 2. Residual mild bilateral right greater than left hydronephrosis but overall decreased compared to the CT from September. Interval slight thick-walled appearance of the bladder, correlate for cystitis. 3. Chronic severe L1 fracture. Redemonstrated T12 fracture with slight interval increase in loss of vertebral body height and continued paravertebral edema. Redemonstrated burst type morphology L5 fracture, about 5 mm retropulsion of the upper vertebral body. Fracture may involve the right pedicle. No progressive loss of vertebral body height but continued paravertebral edema 4. Decreased left hydronephrosis compared to prior CT. Mild right hydronephrosis persists but is also slightly diminished. No hydroureter or obstructing stone. Mild thick-walled appearance of the urinary bladder, correlate for cystitis. 5. Aortic atherosclerosis. 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 Recommend follow-up ultrasound every 3 years. (Ref.: J Vasc Surg. 2018; 67:2-77 and J Am Coll Radiol 2013;10(10):789-794.) 6. 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 Aortic Atherosclerosis (ICD10-I70.0). Electronically Signed   By: Luke Bun M.D.   On: 06/25/2024 21:55   DG Chest Port 1 View Result Date: 06/25/2024 CLINICAL DATA:  Possible sepsis EXAM: PORTABLE CHEST 1 VIEW COMPARISON:  06/01/2024, chest CT 03/26/2024, 06/01/2024, radiograph 03/26/2024 FINDINGS: Hypoventilatory changes. Postsurgical changes of the right lung with faintly visible chain sutures. Diffuse reticular opacity consistent with chronic interstitial lung disease and fibrosis. No superimposed acute airspace disease, effusion or pneumothorax. Stable  cardiomediastinal silhouette. Clips in the left axilla IMPRESSION: Chronic  interstitial lung disease and fibrosis. No superimposed acute airspace disease. Electronically Signed   By: Luke Bun M.D.   On: 06/25/2024 17:31    Pending Labs Unresulted Labs (From admission, onward)     Start     Ordered   06/25/24 1610  Blood Culture (routine x 2)  (Undifferentiated presentation (screening labs and basic nursing orders))  BLOOD CULTURE X 2,   STAT      06/25/24 1611            Vitals/Pain Today's Vitals   06/25/24 2000 06/25/24 2100 06/25/24 2215 06/26/24 0303  BP: (!) 135/57 118/65  (!) 134/59  Pulse:    (!) 117  Resp: (!) 21 (!) 24 (!) 23 (!) 24  Temp:    98.4 F (36.9 C)  TempSrc:    Oral  SpO2:    93%    Isolation Precautions No active isolations  Medications Medications  enoxaparin  (LOVENOX ) injection 40 mg (40 mg Subcutaneous Given 06/26/24 0024)  acetaminophen  (TYLENOL ) tablet 650 mg (has no administration in time range)    Or  acetaminophen  (TYLENOL ) suppository 650 mg (has no administration in time range)  ondansetron  (ZOFRAN ) tablet 4 mg (has no administration in time range)    Or  ondansetron  (ZOFRAN ) injection 4 mg (has no administration in time range)  cefTRIAXone  (ROCEPHIN ) 2 g in sodium chloride  0.9 % 100 mL IVPB (has no administration in time range)  vancomycin  (VANCOREADY) IVPB 1250 mg/250 mL (has no administration in time range)  sodium chloride  0.9 % bolus 500 mL (0 mLs Intravenous Stopped 06/25/24 1900)  sodium chloride  0.9 % bolus 500 mL (0 mLs Intravenous Stopped 06/25/24 2039)  cefTRIAXone  (ROCEPHIN ) 1 g in sodium chloride  0.9 % 100 mL IVPB (0 g Intravenous Stopped 06/25/24 2032)  iohexol  (OMNIPAQUE ) 300 MG/ML solution 80 mL (80 mLs Intravenous Contrast Given 06/25/24 2117)  sodium chloride  0.9 % bolus 500 mL (500 mLs Intravenous New Bag/Given 06/26/24 0254)  vancomycin  (VANCOREADY) IVPB 1500 mg/300 mL (0 mg Intravenous Stopped 06/26/24 0256)    Mobility non-ambulatory

## 2024-06-26 NOTE — Consult Note (Addendum)
 WOC Nurse Consult Note: Reason for Consult: sacral ulcer  Wound type: 1 Unstageable Pressure Injury sacrum  80% tan 20% red  2.  Deep tissue Pressure Injury L buttock purple maroon discoloration that is evolving with red and tan tissue noted  Pressure Injury POA: Yes Measurement: see nursing flowsheet  Wound bed: as above  Drainage (amount, consistency, odor) appears tan from necrotic sacral wound and serosanguinous from L buttock wound on old dressing  Periwound: mild erythema  Dressing procedure/placement/frequency:  Cleanse sacral wound with Vashe wound cleanser Soila [phone]) do not rinse and allow to air dry.  Apply 1/4 thick layer of Santyl to wound bed, top with saline moist gauze, dry gauze and silicone foam or ABD pad and tape whichever is preferred.  Cleanse L buttock wound with Vashe, apply Xeroform gauze ([place]son [phone]) to wound bed daily and secure with silicone foam.  Patient would benefit from low air loss mattress for pressure redistribution and moisture management.   POC discussed with bedside nurse. Appreciate M. Hilarie, RN assistance with this consult.   Deep Tissue Pressure Injuries are high risk to deteriorate.  Please reconsult WOC team if further needs arise.   Thank you,    Powell Bar MSN, RN-BC, [employer]

## 2024-06-26 NOTE — Hospital Course (Signed)
 Tracy Dickerson is a 82 y.o. female with medical history significant of breast cancer, lung cancer, hypertension, hyperlipidemia, atrial fibrillation, dementia, COPD presented to the hospital from skilled nursing facility with fever.  Of note patient was recently discharged from hospital after being treated for worsening dementia confusion and possible pneumonia.  On this admission patient was fatigued confused tachycardic and febrile.  Initial labs creatinine 1.3 baseline 0.8, WBC 26.8, hemoglobin 10.8, INR 2.2, BNP 1039, lactic acid 2.6, urinalysis negative for infection.  Patient underwent CT abdomen pelvis which showed possible cellulitis.  Chest x-ray showed chronic interstitial lung disease.  Patient was then admitted hospital for further evaluation and treatment.  Sepsis most likely secondary to sacral cellulitis Presented confusion fever and lactic acidosis on presentation.  Lactic acid improved after IV fluids.  CT abdomen pelvis showed subcutaneous edema and soft tissue swelling consistent with cellulitis of gluteal tissues.  Continue vancomycin  and Rocephin . Follow cultures.   Elevated troponin-likely related to sepsis.  Will continue to trend.  No chest pain or EKG changes.   Cognitive impairment-continue to monitor    Paroxysmal A-fib-currently not on rate control or anticoagulation.  Slightly tachycardic.   Essential hypertension-not on any antihypertensives.  Latest blood pressure 131/48   Spinal compression fractures-continue to monitor   History of COPD/pulmonary fibrosis-continue bronchodilators.    Goals of care-patient has engaged palliative care in the past and remains a DNR.

## 2024-06-26 NOTE — H&P (Signed)
 History and Physical    Tracy Dickerson FMW:[id number] DOB: 10/08/1940 DOA: 06/25/2024  PCP: Default, Provider, MD   Chief Complaint:  weakness  HPI: Tracy Dickerson is a 83 y.o. female with medical history significant of breast cancer, lung cancer, hypertension, hyperlipidemia, A-fib, dementia, COPD who presented to the emergency department from her nursing facility.  Patient was recently discharged on 9/15.  At that time she was having worsening dementia and confusion.  She was treated antibiotics for possible pneumonia.  She presented to the ER today due to fever at her nursing facility.  She reportedly had a measured temperature of 104 and has been more fatigued and confused.  She presented to the ER where she is found to be tachycardic in low 100s and hemodynamically stable.  Labs were obtained on presentation which showed troponin 123, 1-38.  CMP showed creatinine 1.3 baseline 0.8, WBC 26.8, hemoglobin 10.8, INR 2.2, BNP 1039, lactic acid 2.6, urinalysis negative for infection.  Patient underwent CT abdomen pelvis which showed possible cellulitis.  On evaluation patient was unable to contribute towards presentation or endorse any symptoms.  Chest x-ray showed chronic interstitial lung disease.   Review of Systems: Review of Systems  Constitutional: Negative.   HENT: Negative.    Eyes: Negative.   Respiratory: Negative.    Cardiovascular: Negative.   Gastrointestinal: Negative.   Genitourinary: Negative.   Musculoskeletal: Negative.   Skin: Negative.   Neurological: Negative.   Endo/Heme/Allergies: Negative.   Psychiatric/Behavioral: Negative.       As per HPI otherwise 10 point review of systems negative.   Allergies  Allergen Reactions   Oxycodone  Itching   Penicillins Rash   Percocet [Oxycodone -Acetaminophen ] Itching    Past Medical History:  Diagnosis Date   Cancer (HCC)    Breast and lung   Diabetes mellitus without complication (HCC)    GERD (gastroesophageal reflux  disease)    Heart disease    Hypertension     Past Surgical History:  Procedure Laterality Date   ABDOMINAL HYSTERECTOMY     APPENDECTOMY     BREAST SURGERY     breast cancer LEFT   COLONOSCOPY WITH PROPOFOL  N/A 08/23/2023   Procedure: COLONOSCOPY WITH PROPOFOL ;  Surgeon: Unk Corinn Skiff, MD;  Location: [facility] ENDOSCOPY;  Service: Gastroenterology;  Laterality: N/A;   ESOPHAGOGASTRODUODENOSCOPY N/A 08/23/2023   Procedure: ESOPHAGOGASTRODUODENOSCOPY (EGD);  Surgeon: Unk Corinn Skiff, MD;  Location: [facility] ENDOSCOPY;  Service: Gastroenterology;  Laterality: N/A;   ESOPHAGOGASTRODUODENOSCOPY (EGD) WITH PROPOFOL  N/A 01/07/2023   Procedure: ESOPHAGOGASTRODUODENOSCOPY (EGD) WITH PROPOFOL ;  Surgeon: Jinny Carmine, MD;  Location: [facility] ENDOSCOPY;  Service: Endoscopy;  Laterality: N/A;   GIVENS CAPSULE STUDY N/A 08/23/2023   Procedure: GIVENS CAPSULE STUDY;  Surgeon: Unk Corinn Skiff, MD;  Location: [facility] ENDOSCOPY;  Service: Gastroenterology;  Laterality: N/A;   OPEN REDUCTION INTERNAL FIXATION (ORIF) TIBIA/FIBULA FRACTURE Left 02/03/2018   Procedure: OPEN REDUCTION INTERNAL FIXATION (ORIF) DISTAL TIBIA FRACTURE;  Surgeon: Edie Norleen PARAS, MD;  Location: [facility] ORS;  Service: Orthopedics;  Laterality: Left;  ankle/lower leg   TONSILLECTOMY       reports that she has quit smoking. She has never used smokeless tobacco. She reports that she does not currently use alcohol. She reports that she does not use drugs.  Family History  Problem Relation Age of Onset   Cancer Mother     Prior to Admission medications   Medication Sig Start Date End Date Taking? Authorizing Provider  nystatin  (MYCOSTATIN /NYSTOP ) powder SMARTSIG:1 Application Topical 2-3  Times Daily 06/25/24  Yes [provider]  ondansetron  (ZOFRAN -ODT) 4 MG disintegrating tablet Take 4 mg by mouth every 8 (eight) hours as needed. 06/10/24  Yes [provider]  albuterol  (PROVENTIL ) (2.5 MG/3ML) 0.083% nebulizer solution  Take 3 mLs (2.5 mg total) by nebulization every 4 (four) hours as needed for wheezing or shortness of breath. 03/27/24   Barbarann Nest, MD  fluticasone -salmeterol (ADVAIR) 250-50 MCG/ACT AEPB Inhale 1 puff into the lungs in the morning and at bedtime.    [provider]  SPIRIVA  HANDIHALER 18 MCG inhalation capsule Place 1 capsule (18 mcg total) into inhaler and inhale daily. 02/09/22   Milissa Darice HERO, PA-C    Physical Exam: Vitals:   06/25/24 1602 06/25/24 1645 06/25/24 2000 06/25/24 2215  BP: 123/64 (!) 128/98 (!) 135/57   Pulse: (!) 125 (!) 115    Resp: 16 (!) 22 (!) 21 (!) 23  Temp: 98.8 F (37.1 C)     TempSrc: Oral     SpO2: 100% 95%     Physical Exam Vitals reviewed.  Constitutional:      Appearance: She is normal weight.  HENT:     Head: Normocephalic.     Nose: Nose normal.     Mouth/Throat:     Mouth: Mucous membranes are moist.     Pharynx: Oropharynx is clear.  Eyes:     Conjunctiva/sclera: Conjunctivae normal.     Pupils: Pupils are equal, round, and reactive to light.  Cardiovascular:     Rate and Rhythm: Normal rate and regular rhythm.     Pulses: Normal pulses.  Pulmonary:     Effort: Pulmonary effort is normal.  Abdominal:     General: Abdomen is flat. Bowel sounds are normal.  Musculoskeletal:        General: Normal range of motion.     Cervical back: Normal range of motion.  Skin:    General: Skin is warm.     Capillary Refill: Capillary refill takes less than 2 seconds.  Neurological:     Mental Status: She is alert. Mental status is at baseline.  Psychiatric:        Mood and Affect: Mood normal.      Labs on Admission: I have personally reviewed the patients's labs and imaging studies.  Assessment/Plan Principal Problem:   Sepsis (HCC)   # Sepsis most likely secondary to sacral cellulitis -Patient presented with confusion and fever found to have lactic acidosis 2.6 - Given fluids in emergency department with improvement to 1.0.   CT chest abdomen pelvis was obtained on 9/9 which showed acute fracture of thoracic spine - CT abdomen pelvis showed subcutaneous edema and soft tissue swelling consistent with cellulitis of gluteal tissues  Plan: Continue vancomycin  and ceftriaxone  Trend lactic acid  Elevated troponin-likely related to sepsis.  Will continue to trend.  # Cognitive impairment-continue to monitor  # Paroxysmal A-fib-currently not on rate control or anticoagulation  # Hypertension-not on any antihypertensives  # Spinal compression fractures-continue to monitor  # History of COPD/pulmonary fibrosis-continue to monitor  # Goals of care-patient has engaged palliative care in the past and remains a DNR.  Admission status: Inpatient Telemetry  Certification: The appropriate patient status for this patient is INPATIENT. Inpatient status is judged to be reasonable and necessary in order to provide the required intensity of service to ensure the patient's safety. The patient's presenting symptoms, physical exam findings, and initial radiographic and laboratory data in the context of their chronic  comorbidities is felt to place them at high risk for further clinical deterioration. Furthermore, it is not anticipated that the patient will be medically stable for discharge from the hospital within 2 midnights of admission.   * I certify that at the point of admission it is my clinical judgment that the patient will require inpatient hospital care spanning beyond 2 midnights from the point of admission due to high intensity of service, high risk for further deterioration and high frequency of surveillance required.DEWAINE Lamar Dess MD [facility] If 7PM-7AM, please contact night-coverage [email or web address]  06/26/2024, 1:37 AM

## 2024-06-26 NOTE — ED Notes (Signed)
 Patient resting in bed chest rising and falling.

## 2024-06-26 NOTE — Progress Notes (Signed)
 Guardian Tracy Dickerson notified and aware of pt hospital stay.  RN attempted to call [facility] two times for pt admission and medication hx questions, no answer.

## 2024-06-26 NOTE — Progress Notes (Signed)
 [name] [address] Hospitalized Hospice Patient Visit   Naoko Diperna is a current hospice patient with [facility] a terminal diagnosis of cerebrovascular disease.  Patient resides at [facility] and was sent to hospital due to fever of 104 as well as increased lethargy and weakness. Facility notified [facility] that patient had been sent to ED. Patient was admitted on 06/25/2024 with a diagnosis of sepsis. Per Dr. Hosie with [facility], this is a hospice approved hospitalization.  Visited patient at bedside. She was resting with eye closed. She did not appear to be in any acute distress. Spoke with nurse caring for patient. Per nurse, patient will be switched to an air mattress bed and she will complete wound care. No other concerns noted. Writer called Olam (legal guardian) and provided update regarding events as well as current plan to receive IV antibiotics for treatment of sepsis. Olam had no questions at this time.    Patient remains GIP appropriate with need for IV antibiotic management of sepsis.    Vital Signs: 98.3/88/14   112/59   SpO2 90% Room Air   I&O:  300/3000   Abnormal Labs: 06/25/24 00:30 Troponin T High Sensitivity: 123 (HH)  Blood Culture (routine x 2) 10.03.25 @18 :00 Staphylococcus species detected Staphylococcus aureus (BCID) detected   06/25/24 18:00 pO2, Ven: <31 (LL) Acid-Base Excess: 2.4 (H) Bicarbonate: 28.8 (H) Glucose: 119 (H) BUN: 33 (H) Creatinine: 1.36 (H) Alkaline Phosphatase: 161 (H) Albumin: 3.4 (L) GFR, Estimated: 39 (L) Pro Brain Natriuretic Peptide: 1,039.0 (H) Troponin T High Sensitivity: 138 (HH) WBC: 26.8 (H) RBC: 3.47 (L) Hemoglobin: 10.8 (L) HCT: 34.7 (L) NEUT#: 25.1 (H) Lymphs Abs: 0.4 (L) Abs Immature Granulocytes: 0.33 (H) Prothrombin Time: 25.3 (H) INR: 2.2 (H)  06/25/24 18:19 Lactic Acid, Venous: 2.6 (HH)  06/25/24 19:20 Urinalysis, w/ Reflex to Culture (Infection Suspected): Rpt ! Hgb urine  dipstick: TRACE ! Mucus: PRESENT  06/26/24 02:49 Troponin T High Sensitivity: 132 (HH)  06/26/24 08:49 Troponin T High Sensitivity: 125 (HH)   Diagnostics:   Narrative & Impression  CLINICAL DATA:  Possible sepsis   EXAM: PORTABLE CHEST 1 VIEW 10.03.2025 @17 :03   COMPARISON:  06/01/2024, chest CT 03/26/2024, 06/01/2024, radiograph 03/26/2024   FINDINGS: Hypoventilatory changes. Postsurgical changes of the right lung with faintly visible chain sutures. Diffuse reticular opacity consistent with chronic interstitial lung disease and fibrosis. No superimposed acute airspace disease, effusion or pneumothorax. Stable cardiomediastinal silhouette. Clips in the left axilla   IMPRESSION: Chronic interstitial lung disease and fibrosis. No superimposed acute airspace disease.     Electronically Signed   By: Luke Bun M.D.   On: 06/25/2024 17:31   CT ABDOMEN PELVIS W CONTRAST Date: 06/25/2024   Show images for CT ABDOMEN PELVIS W CONTRAST  Narrative & Impression CLINICAL DATA:  Sepsis fever weakness   EXAM: CT ABDOMEN AND PELVIS WITH CONTRAST   IMPRESSION: 1. Interim development of subcutaneous edema and soft tissue stranding within the left gluteal soft tissues, this tracks towards the left posterior perianal region but there is no discrete abscess or definitive fistula. Findings are suggestive of cellulitis. 2. Residual mild bilateral right greater than left hydronephrosis but overall decreased compared to the CT from September. Interval slight thick-walled appearance of the bladder, correlate for cystitis. 3. Chronic severe L1 fracture. Redemonstrated T12 fracture with slight interval increase in loss of vertebral body height and continued paravertebral edema. Redemonstrated burst type morphology L5 fracture, about 5 mm retropulsion of the upper vertebral body. Fracture may involve  the right pedicle. No progressive loss of vertebral body height but continued  paravertebral edema 4. Decreased left hydronephrosis compared to prior CT. Mild right hydronephrosis persists but is also slightly diminished. No hydroureter or obstructing stone. Mild thick-walled appearance of the urinary bladder, correlate for cystitis. 5. Aortic atherosclerosis. 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 Recommend follow-up ultrasound every 3 years. (Ref.: J Vasc Surg. 2018; 67:2-77 and J Am Coll Radiol 2013;10(10):789-794.) 6. 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   Aortic Atherosclerosis (ICD10-I70.0).     Electronically Signed   By: Luke Bun M.D.   On: 06/25/2024 21:55   IV/PRN Meds-  Ancef  2g IV x1, Rocephin  1g IV x1, Rocephin  2g IV x1, NS 0.9% bolus 500mL IV x3, Vancomycin  1500mg  IV x1, Tylenol  650mg  PO x1     Current plan as per  Sonjia Held, MD 10.4.25 Assessment and Plan.   Sepsis most likely secondary to sacral cellulitis Presented confusion fever and lactic acidosis on presentation.  Lactic acid improved after IV fluids.  CT abdomen pelvis showed subcutaneous edema and soft tissue swelling consistent with cellulitis of gluteal tissues.  Continue vancomycin  and Rocephin . Follow cultures.   Elevated troponin-likely related to sepsis.  Will continue to trend.  No chest pain or EKG changes.   Cognitive impairment-continue to monitor    Paroxysmal A-fib-currently not on rate control or anticoagulation.  Slightly tachycardic.   Essential hypertension-not on any antihypertensives.  Latest blood pressure 131/48   Spinal compression fractures-continue to monitor   History of COPD/pulmonary fibrosis-continue bronchodilators.   Goals of care-patient has engaged palliative care in the past and remains a DNR.  Looks like being followed by hospice   Discharge Planning- Ongoing, Pending medical stabilization   Family Contact:  called Olam (legal guardian) No questions or concerns at  this time.    IDT: Updated   Goals of Care: DNR  Transfer summary and Med list sent to be placed under Media tab.    Should patient require EMS transport at discharge please reach out to [facility] as we contract with them for this service. Thank you for the opportunity to participate in this patient's care, please don't hesitate to call for any hospice related questions or concerns.    Nat Babe, BSN, [facility] Liaison [phone]

## 2024-06-26 NOTE — Progress Notes (Signed)
 Pharmacy Antibiotic Note  Tracy Dickerson is a 83 y.o. female admitted on 06/25/2024 with sacral cellulitis.  Pharmacy has been consulted for vancomycin  dosing.  Plan: Vancomycin  1500 mg IV x 1 given in ED @ 0033  Vancomycin  1250 mg IV q36 for eAUC 491.5, Css min 11.9 using Wt 78.4 kg, SCr 1.36 Ceftriaxone  2 gm IV q24 per MD    Temp (24hrs), Avg:98.8 F (37.1 C), Min:98.8 F (37.1 C), Max:98.8 F (37.1 C)  Recent Labs  Lab 06/25/24 1800 06/25/24 1819 06/26/24 0034  WBC 26.8*  --   --   CREATININE 1.36*  --   --   LATICACIDVEN  --  2.6* 1.0    CrCl cannot be calculated (Unknown ideal weight.).    Allergies  Allergen Reactions   Oxycodone  Itching   Penicillins Rash   Percocet [Oxycodone -Acetaminophen ] Itching    Thank you for allowing pharmacy to be a part of this patient's care. Rosaline IVAR Edison, Pharm.D Use secure chat for questions 06/26/2024 1:44 AM

## 2024-06-27 DIAGNOSIS — R6520 Severe sepsis without septic shock: Secondary | ICD-10-CM | POA: Diagnosis not present

## 2024-06-27 DIAGNOSIS — A419 Sepsis, unspecified organism: Secondary | ICD-10-CM | POA: Diagnosis not present

## 2024-06-27 LAB — BASIC METABOLIC PANEL WITH GFR
Anion gap: 10 (ref 5–15)
BUN: 27 mg/dL — ABNORMAL HIGH (ref 8–23)
CO2: 24 mmol/L (ref 22–32)
Calcium: 8.7 mg/dL — ABNORMAL LOW (ref 8.9–10.3)
Chloride: 107 mmol/L (ref 98–111)
Creatinine, Ser: 0.95 mg/dL (ref 0.44–1.00)
GFR, Estimated: >60 mL/min (ref >60)
Glucose, Bld: 101 mg/dL — ABNORMAL HIGH (ref 70–99)
Potassium: 3.8 mmol/L (ref 3.5–5.1)
Sodium: 142 mmol/L (ref 135–145)

## 2024-06-27 LAB — CBC
HCT: 29.9 % — ABNORMAL LOW (ref 36.0–46.0)
Hemoglobin: 9 g/dL — ABNORMAL LOW (ref 12.0–15.0)
MCH: 28.9 pg (ref 26.0–34.0)
MCHC: 30.1 g/dL (ref 30.0–36.0)
MCV: 96.1 fL (ref 80.0–100.0)
Platelets: 157 K/uL (ref 150–400)
RBC: 3.11 MIL/uL — ABNORMAL LOW (ref 3.87–5.11)
RDW: 14.9 % (ref 11.5–15.5)
WBC: 18.2 K/uL — ABNORMAL HIGH (ref 4.0–10.5)
nRBC: 0 % (ref 0.0–0.2)

## 2024-06-27 LAB — MAGNESIUM: Magnesium: 2.3 mg/dL (ref 1.7–2.4)

## 2024-06-27 LAB — PHOSPHORUS: Phosphorus: 2.8 mg/dL (ref 2.5–4.6)

## 2024-06-27 MED ORDER — ENSURE PLUS HIGH PROTEIN PO LIQD
237.0000 mL | Freq: Two times a day (BID) | ORAL | Status: DC
  Administered 2024-06-27: 237 mL via ORAL

## 2024-06-27 NOTE — Plan of Care (Signed)
  Problem: Health Behavior/Discharge Planning: Goal: Ability to manage health-related needs will improve Outcome: Progressing   Problem: Clinical Measurements: Goal: Ability to maintain clinical measurements within normal limits will improve Outcome: Progressing   Problem: Nutrition: Goal: Adequate nutrition will be maintained Outcome: Progressing   

## 2024-06-27 NOTE — Progress Notes (Signed)
 Tracy Dickerson [address] Hospitalized Hospice Patient Visit   Tracy Dickerson is a current hospice patient with [facility] a terminal diagnosis of cerebrovascular disease.  Patient resides at [facility] and was sent to hospital due to fever of 104 as well as increased lethargy and weakness. Facility notified [facility] that patient had been sent to ED. Patient was admitted on 06/25/2024 with a diagnosis of sepsis. Per Dr. Hosie with [facility], this is a hospice approved hospitalization.   Visited patient at bedside. She was alert and able to answer simple questions. She verbalized feeling comfortable on air mattress bed. Per staff, patient has been refusing to eat. Writer asked patient if she would be willing to drink an Ensure and she agreed. Spoke with nurse caring for patient and she stated she would provide. Patient denied any pain or discomfort and did not appear to be in any acute distress. Spoke with nurse caring for patient. No other concerns at this time. Spoke with patient guardian Tracy Dickerson, she had no questions of concerns but was very appreciative for the updates on how patient was doing.   Patient remains GIP appropriate with need for IV antibiotic management of sepsis.    Vital Signs: 98.5/128/16   131/76   SpO2 96% Room Air   I&O:  600/1000   Abnormal Labs: 06/27/24 05:50 Basic metabolic panel with GFR: Rpt ! Glucose: 101 (H) BUN: 27 (H) Calcium : 8.7 (L) WBC: 18.2 (H) RBC: 3.11 (L) Hemoglobin: 9.0 (L) HCT: 29.9 (L)   Diagnostics:   None new     IV/PRN Meds-  Ancef  2g IV Q8H, Tylenol  650mg  PO x1       Current plan as per  Tracy Held, MD 10.5.25 Assessment and Plan. Principal Problem:   Sepsis (HCC)   Pressure injury sacrum left unstageable with cellulitis. Pressure injury left buttocks deep tissue injury. Sepsis most likely secondary to sacral cellulitis   Present on admission continue wound care. Presented confusion fever and lactic acidosis on  presentation.  Lactic acid improved after IV fluids.  CT abdomen pelvis showed subcutaneous edema and soft tissue swelling consistent with cellulitis of gluteal tissues.  Was initially on vancomycin  and Rocephin , Blood cultures showing gram-positive cocci in 1 bottle.  Discussed with pharmacy and has been transition to  IV efazolin.   Wound 06/26/24 1322 Pressure Injury Sacrum Left Unstageable - Full thickness tissue loss in which the base of the injury is covered by slough (yellow, tan, gray, green or brown) and/or eschar (tan, brown or black) in the wound bed. (Active)   Wound 06/26/24 1323 Pressure Injury Buttocks Left Deep Tissue Pressure Injury - Purple or maroon localized area of discolored intact skin or blood-filled blister due to damage of underlying soft tissue from pressure and/or shear. (Active)     Elevated troponin-likely related to sepsis.  Will continue to trend.  No chest pain or EKG changes.   Advanced dementia.-continue to monitor, currently on palliative care hospice.    Paroxysmal A-fib-currently not on rate control or anticoagulation.  Slightly tachycardic.   Essential hypertension-not on any antihypertensives.  Latest blood pressure 131/48   Spinal compression fractures-continue to monitor   History of COPD/pulmonary fibrosis-continue bronchodilators.    Goals of care-patient has engaged palliative care in the past and remains a DNR.  Looks like being followed by hospice    Discharge Planning- Ongoing, Pending medical stabilization   Family Contact:  called Tracy Dickerson (legal guardian) No questions or concerns at this time.    IDT:  Updated   Goals of Care: DNR   Should patient require EMS transport at discharge please reach out to [facility] as we contract with them for this service. Thank you for the opportunity to participate in this patient's care, please don't hesitate to call for any hospice related questions or concerns.    Tracy Dickerson, BSN, [facility]  Liaison [phone]

## 2024-06-27 NOTE — Progress Notes (Signed)
 PROGRESS NOTE  Tracy Dickerson FMW:[id number] DOB: 1941-05-04 DOA: 06/25/2024 PCP: Default, Provider, MD   LOS: 2 days   Brief narrative:  Tracy Dickerson is a 83 y.o. female with medical history significant of breast cancer, lung cancer, hypertension, hyperlipidemia, atrial fibrillation, dementia, COPD presented to the hospital from skilled nursing facility with fever.  Of note patient was recently discharged from hospital after being treated for worsening dementia confusion and possible pneumonia.  On this admission patient was fatigued confused tachycardic and febrile.  Initial labs creatinine 1.3 baseline 0.8, WBC 26.8, hemoglobin 10.8, INR 2.2, BNP 1039, lactic acid 2.6, urinalysis negative for infection.  Patient underwent CT abdomen pelvis which showed possible cellulitis.  Chest x-ray showed chronic interstitial lung disease.  Patient was then admitted hospital for further evaluation and treatment.      Assessment/Plan: Principal Problem:   Sepsis (HCC)  Pressure injury sacrum left unstageable with cellulitis. Pressure injury left buttocks deep tissue injury. Sepsis most likely secondary to sacral cellulitis   Present on admission continue wound care. Presented confusion fever and lactic acidosis on presentation.  Lactic acid improved after IV fluids.  CT abdomen pelvis showed subcutaneous edema and soft tissue swelling consistent with cellulitis of gluteal tissues.  Was initially on vancomycin  and Rocephin , Blood cultures showing gram-positive cocci in 1 bottle.  Discussed with pharmacy and has been transition to  IV efazolin.  Wound 06/26/24 1322 Pressure Injury Sacrum Left Unstageable - Full thickness tissue loss in which the base of the injury is covered by slough (yellow, tan, gray, green or brown) and/or eschar (tan, brown or black) in the wound bed. (Active)     Wound 06/26/24 1323 Pressure Injury Buttocks Left Deep Tissue Pressure Injury - Purple or maroon localized area of  discolored intact skin or blood-filled blister due to damage of underlying soft tissue from pressure and/or shear. (Active)     Elevated troponin-likely related to sepsis.  Will continue to trend.  No chest pain or EKG changes.   Advanced dementia.-continue to monitor, currently on palliative care hospice.    Paroxysmal A-fib-currently not on rate control or anticoagulation.  Slightly tachycardic.   Essential hypertension-not on any antihypertensives.  Latest blood pressure 131/48   Spinal compression fractures-continue to monitor   History of COPD/pulmonary fibrosis-continue bronchodilators.    Goals of care-patient has engaged palliative care in the past and remains a DNR.  Looks like being followed by hospice  DVT prophylaxis: enoxaparin  (LOVENOX ) injection 40 mg Start: 06/25/24 2315   Disposition: Skilled nursing facility with hospice  Status is: Inpatient Remains inpatient appropriate because: Need for IV antibiotics, pending clinical improvement,    Code Status:     Code Status: Limited: Do not attempt resuscitation (DNR) -DNR-LIMITED -Do Not Intubate/DNI   Family Communication: None at bedside  Consultants: None  Procedures: None  Anti-infectives:  Cefazolin   Anti-infectives (From admission, onward)    Start     Dose/Rate Route Frequency Ordered Stop   06/27/24 1200  vancomycin  (VANCOREADY) IVPB 1250 mg/250 mL  Status:  Discontinued        1,250 mg 166.7 mL/hr over 90 Minutes Intravenous Every 36 hours 06/26/24 0147 06/26/24 1157   06/26/24 1400  ceFAZolin  (ANCEF ) IVPB 2g/100 mL premix        2 g 200 mL/hr over 30 Minutes Intravenous Every 8 hours 06/26/24 1157     06/26/24 0800  cefTRIAXone  (ROCEPHIN ) 2 g in sodium chloride  0.9 % 100 mL IVPB  Status:  Discontinued  2 g 200 mL/hr over 30 Minutes Intravenous Every 24 hours 06/26/24 0137 06/26/24 1157   06/25/24 2330  vancomycin  (VANCOREADY) IVPB 1500 mg/300 mL        1,500 mg 150 mL/hr over 120  Minutes Intravenous  Once 06/25/24 2303 06/26/24 0256   06/25/24 2300  vancomycin  (VANCOCIN ) IVPB 1000 mg/200 mL premix  Status:  Discontinued        1,000 mg 200 mL/hr over 60 Minutes Intravenous  Once 06/25/24 2259 06/25/24 2303   06/25/24 2300  ceFEPIme  (MAXIPIME ) 2 g in sodium chloride  0.9 % 100 mL IVPB  Status:  Discontinued        2 g 200 mL/hr over 30 Minutes Intravenous  Once 06/25/24 2259 06/26/24 0137   06/25/24 1915  cefTRIAXone  (ROCEPHIN ) 1 g in sodium chloride  0.9 % 100 mL IVPB        1 g 200 mL/hr over 30 Minutes Intravenous  Once 06/25/24 1908 06/25/24 2032        Subjective: Today, patient was seen and examined at bedside.  Nursing staff reported that she refused breakfast multiple times and did not wish wound care.  Heart rate was slightly elevated as well.  Denies any nausea or vomiting has had bowel movements.    Objective: Vitals:   06/27/24 1034 06/27/24 1148  BP: (!) 145/92 131/76  Pulse: (!) 130 (!) 128  Resp: 18 16  Temp: 98.3 F (36.8 C) 98.5 F (36.9 C)  SpO2: 95% 96%    Intake/Output Summary (Last 24 hours) at 06/27/2024 1415 Last data filed at 06/27/2024 1047 Gross per 24 hour  Intake 100 ml  Output 1000 ml  Net -900 ml   There were no vitals filed for this visit. There is no height or weight on file to calculate BMI.   Physical Exam: GENERAL: Patient is alert awake and Communicative, not in obvious distress.  Elderly patient chronically ill. HENT: No scleral pallor or icterus. Pupils equally reactive to light. Oral mucosa is moist NECK: is supple, no gross swelling noted. CHEST: Clear to auscultation. No crackles or wheezes.  Diminished breath sounds bilaterally. CVS: S1 and S2 heard, no murmur.   ABDOMEN: Soft, non-tender, bowel sounds are present.  Urethral catheter in place. EXTREMITIES: No edema. CNS: Cranial nerves are intact. No focal motor deficits. SKIN: warm and dry, skin with wounds  Data Review: I have personally reviewed the  following laboratory data and studies,  CBC: Recent Labs  Lab 06/25/24 1800 06/27/24 0550  WBC 26.8* 18.2*  NEUTROABS 25.1*  --   HGB 10.8* 9.0*  HCT 34.7* 29.9*  MCV 100.0 96.1  PLT 213 157   Basic Metabolic Panel: Recent Labs  Lab 06/25/24 1800 06/27/24 0550  NA 138 142  K 5.0 3.8  CL 102 107  CO2 24 24  GLUCOSE 119* 101*  BUN 33* 27*  CREATININE 1.36* 0.95  CALCIUM  9.6 8.7*  MG  --  2.3  PHOS  --  2.8   Liver Function Tests: Recent Labs  Lab 06/25/24 1800  AST 28  ALT 10  ALKPHOS 161*  BILITOT 0.8  PROT 7.2  ALBUMIN 3.4*   No results for input(s): LIPASE, AMYLASE in the last 168 hours. No results for input(s): AMMONIA in the last 168 hours. Cardiac Enzymes: No results for input(s): CKTOTAL, CKMB, CKMBINDEX, TROPONINI in the last 168 hours. BNP (last 3 results) Recent Labs    08/20/23 1934  BNP 233.9*    ProBNP (last 3 results) Recent  Labs    06/25/24 1800  PROBNP 1,039.0*    CBG: No results for input(s): GLUCAP in the last 168 hours. Recent Results (from the past 240 hours)  Blood Culture (routine x 2)     Status: Abnormal (Preliminary result)   Collection Time: 06/25/24  6:00 PM   Specimen: BLOOD RIGHT ARM  Result Value Ref Range Status   Specimen Description   Final    BLOOD RIGHT ARM Performed at [facility], [address] [place], [place] [address]    Special Requests   Final    BOTTLES DRAWN AEROBIC AND ANAEROBIC Blood Culture adequate volume Performed at [facility], [address], [place], [place] [address]    Culture  Setup Time   Final    GRAM POSITIVE COCCI IN BOTH AEROBIC AND ANAEROBIC BOTTLES CRITICAL RESULT CALLED TO, READ BACK BY AND VERIFIED WITH: PHARMD Utomwen A on 100425 @1045  by SM    Culture (A)  Final    STAPHYLOCOCCUS AUREUS SUSCEPTIBILITIES TO FOLLOW Performed at [facility], [address], [place], [place] [address]    Report Status PENDING  Incomplete   Blood Culture ID Panel (Reflexed)     Status: Abnormal   Collection Time: 06/25/24  6:00 PM  Result Value Ref Range Status   Enterococcus faecalis NOT DETECTED NOT DETECTED Final   Enterococcus Faecium NOT DETECTED NOT DETECTED Final   Listeria monocytogenes NOT DETECTED NOT DETECTED Final   Staphylococcus species DETECTED (A) NOT DETECTED Final    Comment: PHARMD Utomwen A on 100425 @1045  by SM   Staphylococcus aureus (BCID) DETECTED (A) NOT DETECTED Final    Comment: PHARMD Utomwen A on 100425 @1045  by SM   Staphylococcus epidermidis NOT DETECTED NOT DETECTED Final   Staphylococcus lugdunensis NOT DETECTED NOT DETECTED Final   Streptococcus species NOT DETECTED NOT DETECTED Final   Streptococcus agalactiae NOT DETECTED NOT DETECTED Final   Streptococcus pneumoniae NOT DETECTED NOT DETECTED Final   Streptococcus pyogenes NOT DETECTED NOT DETECTED Final   A.calcoaceticus-baumannii NOT DETECTED NOT DETECTED Final   Bacteroides fragilis NOT DETECTED NOT DETECTED Final   Enterobacterales NOT DETECTED NOT DETECTED Final   Enterobacter cloacae complex NOT DETECTED NOT DETECTED Final   Escherichia coli NOT DETECTED NOT DETECTED Final   Klebsiella aerogenes NOT DETECTED NOT DETECTED Final   Klebsiella oxytoca NOT DETECTED NOT DETECTED Final   Klebsiella pneumoniae NOT DETECTED NOT DETECTED Final   Proteus species NOT DETECTED NOT DETECTED Final   Salmonella species NOT DETECTED NOT DETECTED Final   Serratia marcescens NOT DETECTED NOT DETECTED Final   Haemophilus influenzae NOT DETECTED NOT DETECTED Final   Neisseria meningitidis NOT DETECTED NOT DETECTED Final   Pseudomonas aeruginosa NOT DETECTED NOT DETECTED Final   Stenotrophomonas maltophilia NOT DETECTED NOT DETECTED Final   Candida albicans NOT DETECTED NOT DETECTED Final   Candida auris NOT DETECTED NOT DETECTED Final   Candida glabrata NOT DETECTED NOT DETECTED Final   Candida krusei NOT DETECTED NOT DETECTED Final   Candida  parapsilosis NOT DETECTED NOT DETECTED Final   Candida tropicalis NOT DETECTED NOT DETECTED Final   Cryptococcus neoformans/gattii NOT DETECTED NOT DETECTED Final   Meth resistant mecA/C and MREJ NOT DETECTED NOT DETECTED Final    Comment: Performed at [facility], [address] [place], [place] [address]  Blood Culture (routine x 2)     Status: None (Preliminary result)   Collection Time: 06/26/24  8:49 AM  Specimen: BLOOD LEFT ARM  Result Value Ref Range Status   Specimen Description   Final    BLOOD LEFT ARM Performed at [facility], [address] [place], [place] [address]    Special Requests   Final    BOTTLES DRAWN AEROBIC AND ANAEROBIC Blood Culture results may not be optimal due to an inadequate volume of blood received in culture bottles Performed at [facility], [address], [place], [place] [address]    Culture   Final    NO GROWTH < 24 HOURS Performed at [facility], [address], [place], [place] [address]    Report Status PENDING  Incomplete     Studies: CT ABDOMEN PELVIS W CONTRAST Result Date: 06/25/2024 CLINICAL DATA:  Sepsis fever weakness EXAM: CT ABDOMEN AND PELVIS WITH CONTRAST TECHNIQUE: Multidetector CT imaging of the abdomen and pelvis was performed using the standard protocol following bolus administration of intravenous contrast. RADIATION DOSE REDUCTION: This exam was performed according to the departmental dose-optimization program which includes automated exposure control, adjustment of the mA and/or kV according to patient size and/or use of iterative reconstruction technique. CONTRAST:  80mL OMNIPAQUE  IOHEXOL  300 MG/ML  SOLN COMPARISON:  CT 06/01/2024, 03/26/2024, 11/01/2023, 08/11/2023 FINDINGS: Lower chest: Lung bases demonstrate chronic interstitial lung disease with fibrosis and bronchiectasis. Scarring at the right base. No acute superimposed airspace disease. Coronary vascular calcification. Mild  cardiomegaly. Hepatobiliary: No focal liver abnormality is seen. No gallstones, gallbladder wall thickening, or biliary dilatation. Pancreas: Unremarkable. No pancreatic ductal dilatation or surrounding inflammatory changes. Fatty atrophy Spleen: Normal in size without focal abnormality. Adrenals/Urinary Tract: Adrenal glands are within normal limits. Multiple renal cysts for which no specific imaging follow-up is recommended. Presumed exophytic complex cyst mid left kidney series 2, image 31 stable compared to prior, no specific imaging follow-up is recommended. Decreased left hydronephrosis compared to prior CT. Mild right hydronephrosis persists but is also slightly diminished. No hydroureter or obstructing stone. Mild thick-walled appearance of the urinary bladder. Stomach/Bowel: Stomach within normal limits. No dilated small bowel. No acute bowel wall thickening. Moderate retained feces at the rectum. Vascular/Lymphatic: Aortic atherosclerosis. 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 No suspicious lymph nodes. Reproductive: Hysterectomy.  No suspicious adnexal mass Other: Negative for pelvic effusion or free air Musculoskeletal: Interim development of subcutaneous edema and soft tissue stranding within the left gluteal soft tissues, this tracks towards the left posterior perianal region but there is no discrete abscess or definitive fistula. Findings are suggestive of cellulitis. Chronic compression deformity at L1 with retropulsion. Redemonstrated T12 fracture involves the superior endplate and upper aspect of the vertebra. Mild interval loss of height of the vertebral body compared to the prior CT, estimated at 20% centrally. There is mild paravertebral edema at T12. Redemonstrated fracture of the L5 vertebra, involves both the anterior and posterior aspect of vertebra as well as the superior endplate. Fracture may involve the right pedicle of L5 as well. Diffuse loss of the vertebral body  height, similar compared to prior. About 5 mm retropulsion of the upper aspect of the vertebral body. There is continued paravertebral soft tissue edema. IMPRESSION: 1. Interim development of subcutaneous edema and soft tissue stranding within the left gluteal soft tissues, this tracks towards the left posterior perianal region but there is no discrete abscess or definitive fistula. Findings are suggestive of cellulitis. 2. Residual mild bilateral right greater than left hydronephrosis but overall decreased compared to the CT from September. Interval  slight thick-walled appearance of the bladder, correlate for cystitis. 3. Chronic severe L1 fracture. Redemonstrated T12 fracture with slight interval increase in loss of vertebral body height and continued paravertebral edema. Redemonstrated burst type morphology L5 fracture, about 5 mm retropulsion of the upper vertebral body. Fracture may involve the right pedicle. No progressive loss of vertebral body height but continued paravertebral edema 4. Decreased left hydronephrosis compared to prior CT. Mild right hydronephrosis persists but is also slightly diminished. No hydroureter or obstructing stone. Mild thick-walled appearance of the urinary bladder, correlate for cystitis. 5. Aortic atherosclerosis. 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 Recommend follow-up ultrasound every 3 years. (Ref.: J Vasc Surg. 2018; 67:2-77 and J Am Coll Radiol 2013;10(10):789-794.) 6. 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 Aortic Atherosclerosis (ICD10-I70.0). Electronically Signed   By: Luke Bun M.D.   On: 06/25/2024 21:55   DG Chest Port 1 View Result Date: 06/25/2024 CLINICAL DATA:  Possible sepsis EXAM: PORTABLE CHEST 1 VIEW COMPARISON:  06/01/2024, chest CT 03/26/2024, 06/01/2024, radiograph 03/26/2024 FINDINGS: Hypoventilatory changes. Postsurgical changes of the right lung with faintly visible chain sutures.  Diffuse reticular opacity consistent with chronic interstitial lung disease and fibrosis. No superimposed acute airspace disease, effusion or pneumothorax. Stable cardiomediastinal silhouette. Clips in the left axilla IMPRESSION: Chronic interstitial lung disease and fibrosis. No superimposed acute airspace disease. Electronically Signed   By: Luke Bun M.D.   On: 06/25/2024 17:31      Vernal Alstrom, MD  [facility] 06/27/2024  If 7PM-7AM, please contact night-coverage

## 2024-06-27 NOTE — Progress Notes (Signed)
 MEWS Progress Note  Patient Details Name: HESPER VENTURELLA MRN: [id number] DOB: 1941-04-08 Today's Date: 06/27/2024   MEWS Flowsheet Documentation:  Assess: MEWS Score Temp: 98.3 F (36.8 C) BP: (!) 145/92 MAP (mmHg): 102 Pulse Rate: (!) 130 ECG Heart Rate: (!) 118 Resp: 18 Level of Consciousness: Alert SpO2: 95 % O2 Device: Room Air Patient Activity (if Appropriate): In bed Assess: MEWS Score MEWS Temp: 0 MEWS Systolic: 0 MEWS Pulse: 3 MEWS RR: 0 MEWS LOC: 0 MEWS Score: 3 MEWS Score Color: Yellow Assess: SIRS CRITERIA SIRS Temperature : 0 SIRS Respirations : 0 SIRS Pulse: 1 SIRS WBC: 0 SIRS Score Sum : 1 Assess: if the MEWS score is Yellow or Red Were vital signs accurate and taken at a resting state?: Yes Does the patient meet 2 or more of the SIRS criteria?: Yes Does the patient have a confirmed or suspected source of infection?: Yes MEWS guidelines implemented : Yes, yellow Treat MEWS Interventions: Considered administering scheduled or prn medications/treatments as ordered Take Vital Signs Increase Vital Sign Frequency : Yellow: Q2hr x1, continue Q4hrs until patient remains green for 12hrs Escalate MEWS: Escalate: Yellow: Discuss with charge nurse and consider notifying provider and/or RRT Notify: Charge Nurse/RN Name of Charge Nurse/RN Notified: Radio broadcast assistant Provider Notification Provider Name/Title: Pokhrel Date Provider Notified: 06/27/24 Time Provider Notified: 1044 Method of Notification: Page Notification Reason: Change in status Provider response: See new orders Date of Provider Response: 06/27/24 Time of Provider Response: 1044 Notify: Rapid Response Name of Rapid Response RN Notified:  (n/a) Date Rapid Response Notified:  (n/a) Time Rapid Response Notified:  (n/a)   Heart rate increased during CHG/ foley care bath, and wound care. Patient offloaded on left side.    Bhavesh Vazquez G Tallen Schnorr 06/27/2024, 10:45 AM

## 2024-06-28 DIAGNOSIS — R6520 Severe sepsis without septic shock: Secondary | ICD-10-CM | POA: Diagnosis not present

## 2024-06-28 DIAGNOSIS — A419 Sepsis, unspecified organism: Secondary | ICD-10-CM | POA: Diagnosis not present

## 2024-06-28 LAB — CULTURE, BLOOD (ROUTINE X 2): Special Requests: ADEQUATE

## 2024-06-28 LAB — CBC
HCT: 30.1 % — ABNORMAL LOW (ref 36.0–46.0)
Hemoglobin: 9.7 g/dL — ABNORMAL LOW (ref 12.0–15.0)
MCH: 30.7 pg (ref 26.0–34.0)
MCHC: 32.2 g/dL (ref 30.0–36.0)
MCV: 95.3 fL (ref 80.0–100.0)
Platelets: 199 K/uL (ref 150–400)
RBC: 3.16 MIL/uL — ABNORMAL LOW (ref 3.87–5.11)
RDW: 14.4 % (ref 11.5–15.5)
WBC: 13.9 K/uL — ABNORMAL HIGH (ref 4.0–10.5)
nRBC: 0 % (ref 0.0–0.2)

## 2024-06-28 NOTE — Plan of Care (Signed)
?  Problem: Clinical Measurements: ?Goal: Cardiovascular complication will be avoided ?Outcome: Progressing ?  ?Problem: Nutrition: ?Goal: Adequate nutrition will be maintained ?Outcome: Progressing ?  ?Problem: Elimination: ?Goal: Will not experience complications related to bowel motility ?Outcome: Progressing ?  ?

## 2024-06-28 NOTE — TOC Initial Note (Addendum)
 Transition of Care [facility]) - Initial/Assessment Note    Patient Details  Name: Tracy Dickerson MRN: [id number] Date of Birth: 05/14/1941  Transition of Care [facility]) CM/SW Contact:    Doneta Glenys DASEN, RN Phone Number: 06/28/2024, 11:11 AM  Clinical Narrative:                 Patient from [facility]. CM will call Olam Pouch (LG). CM requested legal guardian paper work sent via email. Olam Pouch requested patient's last progress note and sent vial SECURE email. 12:12 PM Olam Pouch ([email or web address]) requested that a letter be emailed to her with recommendations for treatment. 1:14 PM Legal Guardian documentation on shadow chart.  Expected Discharge Plan: Memory Care [facility]) Barriers to Discharge: Continued Medical Work up   Patient Goals and CMS Choice Patient states their goals for this hospitalization and ongoing recovery are:: Home CMS Medicare.gov Compare Post Acute Care list provided to::  (NA) Choice offered to / list presented to : NA [place] ownership interest in [facility].provided to:: Parent NA    Expected Discharge Plan and Services In-house Referral: NA Discharge Planning Services: CM Consult   Living arrangements for the past 2 months: Skilled Nursing Facility                 DME Arranged: N/A DME Agency: NA       HH Arranged: NA HH Agency: NA        Prior Living Arrangements/Services Living arrangements for the past 2 months: Skilled Nursing Facility Lives with:: Facility Resident Patient language and need for interpreter reviewed:: Yes Do you feel safe going back to the place where you live?: Yes      Need for Family Participation in Patient Care: No (Comment) Care giver support system in place?: Yes (comment) Current home services: DME (cane) Criminal Activity/Legal Involvement Pertinent to Current Situation/Hospitalization: No - Comment as needed  Activities of Daily Living   ADL  Screening (condition at time of admission) Independently performs ADLs?: No Is the patient deaf or have difficulty hearing?: No Does the patient have difficulty seeing, even when wearing glasses/contacts?: No Does the patient have difficulty concentrating, remembering, or making decisions?: Yes  Permission Sought/Granted Permission sought to share information with : Case Manager Permission granted to share information with : Yes, Verbal Permission Granted  Share Information with NAME: Roberto Olam (Legal Guardian)  [phone]           Emotional Assessment Appearance:: Appears stated age Attitude/Demeanor/Rapport: Gracious Affect (typically observed): Appropriate Orientation: : Oriented to Self, Oriented to Place, Oriented to  Time, Oriented to Situation Alcohol / Substance Use: Not Applicable Psych Involvement: No (comment)  Admission diagnosis:  Cellulitis of buttock [L03.317] Urinary retention [R33.9] Sepsis (HCC) [A41.9] Patient Active Problem List   Diagnosis Date Noted   Counseling and coordination of care 06/04/2024   Goals of care, counseling/discussion 06/04/2024   DNR (do not resuscitate) 06/04/2024   COVID 06/02/2024   Palliative care by specialist 06/02/2024   HCAP (healthcare-associated pneumonia) 06/01/2024   Cough 03/26/2024   Fever 03/26/2024   Atrial fibrillation, chronic (HCC) 03/26/2024   Sepsis due to pneumonia (HCC) 03/26/2024   Atypical pneumonia 11/21/2023   ILD (interstitial lung disease) (HCC) 11/21/2023   Dementia with behavioral disturbance (HCC) 11/21/2023   Low serum cortisol level 09/02/2023   Headache 08/30/2023   Vitamin D  deficiency 08/28/2023   Dizziness 08/25/2023   Iron  deficiency anemia 08/23/2023   Iron  deficiency anemia due to  chronic blood loss 08/21/2023   Syncope 08/20/2023   Normocytic anemia 08/20/2023   Obesity (BMI 30-39.9) 08/20/2023   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/2023   Vitamin B12  deficiency 06/20/2023   Carotid stenosis 06/20/2023   Dementia (HCC) 06/19/2023   Generalized weakness 06/18/2023   Acute on chronic anemia 06/18/2023   H/O: upper GI bleed 06/18/2023   Chronic anticoagulation 06/18/2023   Infestation by bed bug 06/18/2023   CKD stage 3a, GFR 45-59 ml/min (HCC) 01/07/2023   Overweight (BMI 25.0-29.9) 01/06/2023   Type 2 diabetes mellitus with hyperlipidemia (HCC) 01/06/2023   COPD (chronic obstructive pulmonary disease) (HCC) 01/06/2023   Hypokalemia 01/06/2023   UGIB (upper gastrointestinal bleed) 01/05/2023   Unsatisfactory living conditions 01/05/2023   Constipation    UTI (urinary tract infection) 08/13/2020   HLD (hyperlipidemia) 08/13/2020   Acute renal failure superimposed on stage 3a chronic kidney disease (HCC) 08/13/2020   PAF (paroxysmal atrial fibrillation) (HCC) 08/13/2020   HTN (hypertension) 08/13/2020   Dehydration    Dehydration, moderate    Acute renal failure 04/22/2020   Hyperkalemia    Septic shock (HCC)    Sepsis (HCC) 03/13/2018   Acute kidney injury superimposed on chronic kidney disease 02/13/2018   Closed extra-articular fracture of distal tibia 02/02/2018   Community acquired bilateral lower lobe pneumonia 10/29/2017   Orthostatic hypotension 01/26/2017   Lung nodule 05/30/2014   Sleep apnea 05/10/2014   Barrett's esophagus 05/13/2007   Malignant neoplasm of breast (female) (HCC) 06/01/1997   PCP:  Default, Provider, MD Pharmacy:  No Pharmacies Listed    Social Drivers of Health (SDOH) Social History: SDOH Screenings   Food Insecurity: No Food Insecurity (06/26/2024)  Housing: Low Risk  (06/26/2024)  Transportation Needs: Patient Unable To Answer (06/02/2024)  Utilities: Not At Risk (11/21/2023)  Financial Resource Strain: Low Risk  (02/04/2023)   Received from [facility]  Physical Activity: Inactive (07/16/2021)   Received from [facility]  Social Connections: Patient Unable To Answer (06/26/2024)   Stress: No Stress Concern Present (07/16/2021)   Received from [facility]  Tobacco Use: Medium Risk (06/04/2024)  Health Literacy: Medium Risk (07/16/2021)   Received from [facility]   SDOH Interventions:     Readmission Risk Interventions    03/27/2024   11:14 AM  Readmission Risk Prevention Plan  Transportation Screening Complete  Medication Review (RN Care Manager) Complete  PCP or Specialist appointment within 3-5 days of discharge Complete  HRI or Home Care Consult Complete  SW Recovery Care/Counseling Consult Complete  Palliative Care Screening Not Applicable  Skilled Nursing Facility Not Applicable

## 2024-06-28 NOTE — Progress Notes (Signed)
 Tracy Dickerson [address] Hospitalized Hospice Patient Visit   Tracy Dickerson is a current hospice patient with [facility] a terminal diagnosis of cerebrovascular disease.  Patient resides at [facility] and was sent to hospital due to fever of 104 as well as increased lethargy and weakness. Facility notified [facility] that patient had been sent to ED. Patient was admitted on 06/25/2024 with a diagnosis of sepsis. Per Dr. Hosie with [facility], this is a hospice approved hospitalization.   Visited patient at bedside. Patient is awake and alert.  She verbally responds appropriately to my questions.  She denies pain at this time.  Patient's nurse reports patient ate a few bites of breakfast today which was improved over her intake from yesterday.  I offered some breakfast for her and she denied.  No discussion of discharge yet.   Patient remains GIP appropriate with need for IV antibiotic management of sepsis.    Vital Signs: 98.3/64/20   161/85   SpO2 95% Room Air   I&O:  -550   Abnormal Labs: No new lab results for 06/28/24 06/27/24 05:50 Basic metabolic panel with GFR: Rpt ! Glucose: 101 (H) BUN: 27 (H) Calcium : 8.7 (L) WBC: 18.2 (H) RBC: 3.11 (L) Hemoglobin: 9.0 (L) HCT: 29.9 (L)   Diagnostics:   None new     IV/PRN Meds-  Ancef  2g IV Q8H     Current plan as per  Sonjia Held, MD 10.6.25  Assessment/Plan: Principal Problem:   Sepsis (HCC)   Pressure injury sacrum left unstageable with cellulitis. Pressure injury left buttocks deep tissue injury. Sepsis most likely secondary to sacral cellulitis Staff aureus bacteremia.   Present on admission continue wound care. Presented confusion, fever and lactic acidosis on presentation.  Lactic acid improved after IV fluids.  CT abdomen pelvis showed subcutaneous edema and soft tissue swelling consistent with cellulitis of gluteal tissues.  Was initially on vancomycin  and Rocephin , Blood cultures showing Staph aureus in  1 bottle.  Currently on IV cefazolin .  Spoke with ID about it.  Since patient is in palliative care hospice goals of care will be important to make decision on further treatment plan.  Reached out to patient's legal guardian and the decision is to pursue with medium option of around 2 weeks of IV antibiotics not have to have her go through invasive testing like TEE.  Will repeat blood cultures at this time.   Wound 06/26/24 1322 Pressure Injury Sacrum Left Unstageable - Full thickness tissue loss in which the base of the injury is covered by slough (yellow, tan, gray, green or brown) and/or eschar (tan, brown or black) in the wound bed. (Active)     Wound 06/26/24 1323 Pressure Injury Buttocks Left Deep Tissue Pressure Injury - Purple or maroon localized area of discolored intact skin or blood-filled blister due to damage of underlying soft tissue from pressure and/or shear. (Active)      Elevated troponin-likely related to sepsis.  Will continue to trend.  No chest pain or EKG changes.   Advanced dementia.-continue to monitor, currently on palliative care hospice.    Paroxysmal A-fib-currently not on rate control or anticoagulation.  Slightly tachycardic.   Essential hypertension-not on any antihypertensives.  Latest blood pressure 131/48   Spinal compression fractures-continue to monitor   History of COPD/pulmonary fibrosis-continue bronchodilators   Goals of care-patient has engaged palliative care in the past and remains a DNR.  Looks like being followed by hospice     Discharge Planning- Ongoing, Pending medical stabilization  Family Contact:  called Olam (legal guardian) No questions or concerns at this time.    IDT: Updated   Goals of Care: DNR   Should patient require EMS transport at discharge please reach out to [facility] as we contract with them for this service. Thank you for the opportunity to participate in this patient's care, please don't hesitate to call for any hospice  related questions or concerns.    [name], BSN, [facility] Liaison [phone]

## 2024-06-28 NOTE — Progress Notes (Signed)
 I attest to student documentation.  Cherye Gaertner, MSN-RN Nursing Faculty/Clinical Instructor [facility]

## 2024-06-28 NOTE — Progress Notes (Signed)
 PROGRESS NOTE  Tracy Dickerson FMW:[id number] DOB: [birth year] DOA: 06/25/2024 PCP: Default, Provider, MD   LOS: 3 days   Brief narrative:  Tracy Dickerson is a 83 y.o. female with medical history significant of breast cancer, lung cancer, hypertension, hyperlipidemia, atrial fibrillation, dementia, COPD presented to the hospital from skilled nursing facility with fever.  Of note patient was recently discharged from hospital after being treated for worsening dementia confusion and possible pneumonia.  On this admission patient was fatigued confused tachycardic and febrile.  Initial labs creatinine 1.3 baseline 0.8, WBC 26.8, hemoglobin 10.8, INR 2.2, BNP 1039, lactic acid 2.6, urinalysis negative for infection.  Patient underwent CT abdomen pelvis which showed possible cellulitis.  Chest x-ray showed chronic interstitial lung disease.  Patient was then admitted hospital for further evaluation and treatment.      Assessment/Plan: Principal Problem:   Sepsis (HCC)  Pressure injury sacrum left unstageable with cellulitis. Pressure injury left buttocks deep tissue injury. Sepsis most likely secondary to sacral cellulitis Staff aureus bacteremia.   Present on admission continue wound care. Presented confusion, fever and lactic acidosis on presentation.  Lactic acid improved after IV fluids.  CT abdomen pelvis showed subcutaneous edema and soft tissue swelling consistent with cellulitis of gluteal tissues.  Was initially on vancomycin  and Rocephin , Blood cultures showing Staph aureus in 1 bottle.  Currently on IV cefazolin .  Spoke with ID about it.  Since patient is in palliative care hospice goals of care will be important to make decision on further treatment plan.  Reached out to patient's legal guardian and the decision is to pursue with medium option of around 2 weeks of IV antibiotics not have to have her go through invasive testing like TEE.  Will repeat blood cultures at this time.  Wound 06/26/24  1322 Pressure Injury Sacrum Left Unstageable - Full thickness tissue loss in which the base of the injury is covered by slough (yellow, tan, gray, green or brown) and/or eschar (tan, brown or black) in the wound bed. (Active)     Wound 06/26/24 1323 Pressure Injury Buttocks Left Deep Tissue Pressure Injury - Purple or maroon localized area of discolored intact skin or blood-filled blister due to damage of underlying soft tissue from pressure and/or shear. (Active)     Elevated troponin-likely related to sepsis.  Will continue to trend.  No chest pain or EKG changes.   Advanced dementia.-continue to monitor, currently on palliative care hospice.    Paroxysmal A-fib-currently not on rate control or anticoagulation.  Slightly tachycardic.   Essential hypertension-not on any antihypertensives.  Latest blood pressure 131/48   Spinal compression fractures-continue to monitor   History of COPD/pulmonary fibrosis-continue bronchodilators.    Goals of care-patient has engaged palliative care in the past and remains a DNR.  Looks like being followed by hospice  DVT prophylaxis: enoxaparin  (LOVENOX ) injection 40 mg Start: 06/25/24 2315   Disposition: Skilled nursing facility with hospice  Status is: Inpatient Remains inpatient appropriate because: Need for IV antibiotics, pending clinical improvement,    Code Status:     Code Status: Limited: Do not attempt resuscitation (DNR) -DNR-LIMITED -Do Not Intubate/DNI   Family Communication: Spoke with the patient's legal guardian on 06/29/2019  Consultants: Infectious disease-curbside consultation.  Procedures: None  Anti-infectives:  Cefazolin  IV  Anti-infectives (From admission, onward)    Start     Dose/Rate Route Frequency Ordered Stop   06/27/24 1200  vancomycin  (VANCOREADY) IVPB 1250 mg/250 mL  Status:  Discontinued  1,250 mg 166.7 mL/hr over 90 Minutes Intravenous Every 36 hours 06/26/24 0147 06/26/24 1157   06/26/24 1400   ceFAZolin  (ANCEF ) IVPB 2g/100 mL premix        2 g 200 mL/hr over 30 Minutes Intravenous Every 8 hours 06/26/24 1157     06/26/24 0800  cefTRIAXone  (ROCEPHIN ) 2 g in sodium chloride  0.9 % 100 mL IVPB  Status:  Discontinued        2 g 200 mL/hr over 30 Minutes Intravenous Every 24 hours 06/26/24 0137 06/26/24 1157   06/25/24 2330  vancomycin  (VANCOREADY) IVPB 1500 mg/300 mL        1,500 mg 150 mL/hr over 120 Minutes Intravenous  Once 06/25/24 2303 06/26/24 0256   06/25/24 2300  vancomycin  (VANCOCIN ) IVPB 1000 mg/200 mL premix  Status:  Discontinued        1,000 mg 200 mL/hr over 60 Minutes Intravenous  Once 06/25/24 2259 06/25/24 2303   06/25/24 2300  ceFEPIme  (MAXIPIME ) 2 g in sodium chloride  0.9 % 100 mL IVPB  Status:  Discontinued        2 g 200 mL/hr over 30 Minutes Intravenous  Once 06/25/24 2259 06/26/24 0137   06/25/24 1915  cefTRIAXone  (ROCEPHIN ) 1 g in sodium chloride  0.9 % 100 mL IVPB        1 g 200 mL/hr over 30 Minutes Intravenous  Once 06/25/24 1908 06/25/24 2032      Subjective: Today, patient was seen and examined at bedside.  Patient states that she feels okay.  Denies any pain nausea vomiting fever or chills.   Objective: Vitals:   06/27/24 1937 06/28/24 0410  BP: (!) 148/73 (!) 161/85  Pulse: 86 64  Resp: 20 20  Temp: 98.3 F (36.8 C) 98.3 F (36.8 C)  SpO2: 94% 95%    Intake/Output Summary (Last 24 hours) at 06/28/2024 0857 Last data filed at 06/27/2024 1914 Gross per 24 hour  Intake 0 ml  Output 550 ml  Net -550 ml   There were no vitals filed for this visit. There is no height or weight on file to calculate BMI.   Physical Exam: General:  Average built, not in obvious distress, elderly female, appears chronically ill. HENT:   No scleral pallor or icterus noted. Oral mucosa is moist.  Chest: Diminished breath sounds bilaterally.   CVS: S1 &S2 heard. No murmur.  Regular rate and rhythm. Abdomen: Soft, nontender, nondistended.  Bowel sounds are heard.   Foley catheter in place. Extremities: No cyanosis, clubbing or edema.  Peripheral pulses are palpable. Psych: Alert, awake and Communicative, has cognitive dysfunction. CNS:  No cranial nerve deficits.  Moves all extremities Skin: Warm and dry.  Skin with wounds.  Data Review: I have personally reviewed the following laboratory data and studies,  CBC: Recent Labs  Lab 06/25/24 1800 06/27/24 0550  WBC 26.8* 18.2*  NEUTROABS 25.1*  --   HGB 10.8* 9.0*  HCT 34.7* 29.9*  MCV 100.0 96.1  PLT 213 157   Basic Metabolic Panel: Recent Labs  Lab 06/25/24 1800 06/27/24 0550  NA 138 142  K 5.0 3.8  CL 102 107  CO2 24 24  GLUCOSE 119* 101*  BUN 33* 27*  CREATININE 1.36* 0.95  CALCIUM  9.6 8.7*  MG  --  2.3  PHOS  --  2.8   Liver Function Tests: Recent Labs  Lab 06/25/24 1800  AST 28  ALT 10  ALKPHOS 161*  BILITOT 0.8  PROT 7.2  ALBUMIN 3.4*  No results for input(s): LIPASE, AMYLASE in the last 168 hours. No results for input(s): AMMONIA in the last 168 hours. Cardiac Enzymes: No results for input(s): CKTOTAL, CKMB, CKMBINDEX, TROPONINI in the last 168 hours. BNP (last 3 results) Recent Labs    08/20/23 1934  BNP 233.9*    ProBNP (last 3 results) Recent Labs    06/25/24 1800  PROBNP 1,039.0*    CBG: No results for input(s): GLUCAP in the last 168 hours. Recent Results (from the past 240 hours)  Blood Culture (routine x 2)     Status: Abnormal   Collection Time: 06/25/24  6:00 PM   Specimen: BLOOD RIGHT ARM  Result Value Ref Range Status   Specimen Description   Final    BLOOD RIGHT ARM Performed at [facility], [address], [place], [place] [address]    Special Requests   Final    BOTTLES DRAWN AEROBIC AND ANAEROBIC Blood Culture adequate volume Performed at [facility], [address], [place], [place] [address]    Culture  Setup Time   Final    GRAM POSITIVE COCCI IN BOTH AEROBIC AND ANAEROBIC  BOTTLES CRITICAL RESULT CALLED TO, READ BACK BY AND VERIFIED WITH: PHARMD Utomwen A on 100425 @1045  by SM Performed at [facility], [address], [place], [place] [address]    Culture STAPHYLOCOCCUS AUREUS (A)  Final   Report Status 06/28/2024 FINAL  Final   Organism ID, Bacteria STAPHYLOCOCCUS AUREUS  Final      Susceptibility   Staphylococcus aureus - MIC*    CIPROFLOXACIN  >=8 RESISTANT Resistant     ERYTHROMYCIN >=8 RESISTANT Resistant     GENTAMICIN <=0.5 SENSITIVE Sensitive     OXACILLIN 0.5 SENSITIVE Sensitive     TETRACYCLINE <=1 SENSITIVE Sensitive     VANCOMYCIN  1 SENSITIVE Sensitive     TRIMETH /SULFA  >=320 RESISTANT Resistant     CLINDAMYCIN  <=0.25 SENSITIVE Sensitive     RIFAMPIN <=0.5 SENSITIVE Sensitive     Inducible Clindamycin  NEGATIVE Sensitive     LINEZOLID 2 SENSITIVE Sensitive     * STAPHYLOCOCCUS AUREUS  Blood Culture ID Panel (Reflexed)     Status: Abnormal   Collection Time: 06/25/24  6:00 PM  Result Value Ref Range Status   Enterococcus faecalis NOT DETECTED NOT DETECTED Final   Enterococcus Faecium NOT DETECTED NOT DETECTED Final   Listeria monocytogenes NOT DETECTED NOT DETECTED Final   Staphylococcus species DETECTED (A) NOT DETECTED Final    Comment: PHARMD Utomwen A on 100425 @1045  by SM   Staphylococcus aureus (BCID) DETECTED (A) NOT DETECTED Final    Comment: PHARMD Utomwen A on 100425 @1045  by SM   Staphylococcus epidermidis NOT DETECTED NOT DETECTED Final   Staphylococcus lugdunensis NOT DETECTED NOT DETECTED Final   Streptococcus species NOT DETECTED NOT DETECTED Final   Streptococcus agalactiae NOT DETECTED NOT DETECTED Final   Streptococcus pneumoniae NOT DETECTED NOT DETECTED Final   Streptococcus pyogenes NOT DETECTED NOT DETECTED Final   A.calcoaceticus-baumannii NOT DETECTED NOT DETECTED Final   Bacteroides fragilis NOT DETECTED NOT DETECTED Final   Enterobacterales NOT DETECTED NOT DETECTED Final   Enterobacter cloacae complex  NOT DETECTED NOT DETECTED Final   Escherichia coli NOT DETECTED NOT DETECTED Final   Klebsiella aerogenes NOT DETECTED NOT DETECTED Final   Klebsiella oxytoca NOT DETECTED NOT DETECTED Final   Klebsiella pneumoniae NOT DETECTED NOT DETECTED Final   Proteus species NOT DETECTED NOT DETECTED Final   Salmonella species  NOT DETECTED NOT DETECTED Final   Serratia marcescens NOT DETECTED NOT DETECTED Final   Haemophilus influenzae NOT DETECTED NOT DETECTED Final   Neisseria meningitidis NOT DETECTED NOT DETECTED Final   Pseudomonas aeruginosa NOT DETECTED NOT DETECTED Final   Stenotrophomonas maltophilia NOT DETECTED NOT DETECTED Final   Candida albicans NOT DETECTED NOT DETECTED Final   Candida auris NOT DETECTED NOT DETECTED Final   Candida glabrata NOT DETECTED NOT DETECTED Final   Candida krusei NOT DETECTED NOT DETECTED Final   Candida parapsilosis NOT DETECTED NOT DETECTED Final   Candida tropicalis NOT DETECTED NOT DETECTED Final   Cryptococcus neoformans/gattii NOT DETECTED NOT DETECTED Final   Meth resistant mecA/C and MREJ NOT DETECTED NOT DETECTED Final    Comment: Performed at [facility], [address], [place], [place] [address]  Blood Culture (routine x 2)     Status: None (Preliminary result)   Collection Time: 06/26/24  8:49 AM   Specimen: BLOOD LEFT ARM  Result Value Ref Range Status   Specimen Description   Final    BLOOD LEFT ARM Performed at [facility], [address] [place], [place] [address]    Special Requests   Final    BOTTLES DRAWN AEROBIC AND ANAEROBIC Blood Culture results may not be optimal due to an inadequate volume of blood received in culture bottles Performed at [facility], [address] [place], [place] [address]    Culture   Final    NO GROWTH 2 DAYS Performed at [facility], [address], [place], [place] [address]    Report Status PENDING  Incomplete     Studies: No results found.   Lala Been, MD  [facility] 06/28/2024  If 7PM-7AM, please contact night-coverage

## 2024-06-29 DIAGNOSIS — R6520 Severe sepsis without septic shock: Secondary | ICD-10-CM | POA: Diagnosis not present

## 2024-06-29 DIAGNOSIS — A419 Sepsis, unspecified organism: Secondary | ICD-10-CM | POA: Diagnosis not present

## 2024-06-29 LAB — CBC
HCT: 32.7 % — ABNORMAL LOW (ref 36.0–46.0)
Hemoglobin: 10.5 g/dL — ABNORMAL LOW (ref 12.0–15.0)
MCH: 30.9 pg (ref 26.0–34.0)
MCHC: 32.1 g/dL (ref 30.0–36.0)
MCV: 96.2 fL (ref 80.0–100.0)
Platelets: 213 K/uL (ref 150–400)
RBC: 3.4 MIL/uL — ABNORMAL LOW (ref 3.87–5.11)
RDW: 14.3 % (ref 11.5–15.5)
WBC: 10.7 K/uL — ABNORMAL HIGH (ref 4.0–10.5)
nRBC: 0 % (ref 0.0–0.2)

## 2024-06-29 LAB — MAGNESIUM: Magnesium: 2.1 mg/dL (ref 1.7–2.4)

## 2024-06-29 LAB — BASIC METABOLIC PANEL WITH GFR
Anion gap: 12 (ref 5–15)
BUN: 21 mg/dL (ref 8–23)
CO2: 25 mmol/L (ref 22–32)
Calcium: 8.7 mg/dL — ABNORMAL LOW (ref 8.9–10.3)
Chloride: 105 mmol/L (ref 98–111)
Creatinine, Ser: 0.65 mg/dL (ref 0.44–1.00)
GFR, Estimated: >60 mL/min (ref >60)
Glucose, Bld: 83 mg/dL (ref 70–99)
Potassium: 3.3 mmol/L — ABNORMAL LOW (ref 3.5–5.1)
Sodium: 142 mmol/L (ref 135–145)

## 2024-06-29 MED ORDER — POTASSIUM CHLORIDE CRYS ER 20 MEQ PO TBCR
40.0000 meq | EXTENDED_RELEASE_TABLET | Freq: Once | ORAL | Status: DC
Start: 2024-06-29 — End: 2024-06-30
  Filled 2024-06-29: qty 2

## 2024-06-29 NOTE — Progress Notes (Addendum)
 PROGRESS NOTE  Tracy Dickerson FMW:[id number] DOB: 1941-01-13 DOA: 06/25/2024 PCP: Default, Provider, MD   LOS: 4 days   Brief narrative:  Tracy Dickerson is a 83 y.o. female with medical history significant of breast cancer, lung cancer, hypertension, hyperlipidemia, atrial fibrillation, dementia, COPD presented to the hospital from skilled nursing facility with fever.  Of note patient was recently discharged from hospital after being treated for worsening dementia confusion and possible pneumonia.  On this admission patient was fatigued confused tachycardic and febrile.  Initial labs creatinine 1.3 baseline 0.8, WBC 26.8, hemoglobin 10.8, INR 2.2, BNP 1039, lactic acid 2.6, urinalysis negative for infection.  Patient underwent CT abdomen pelvis which showed possible cellulitis.  Chest x-ray showed chronic interstitial lung disease.  Patient was then admitted to the hospital for further evaluation and treatment.     Assessment/Plan: Principal Problem:   Sepsis (HCC)  Pressure injury sacrum left unstageable with cellulitis. Pressure injury left buttocks deep tissue injury. Sepsis most likely secondary to sacral cellulitis Staff aureus bacteremia. Patient presented confusion, fever and lactic acidosis on presentation.  Lactic acid improved after IV fluids.  CT abdomen pelvis showed subcutaneous edema and soft tissue swelling consistent with cellulitis of gluteal tissues.  Was initially on vancomycin  and Rocephin , Blood cultures showing Staph aureus in 1 bottle.  Currently on IV cefazolin .  Communicated with ID Dr Dea to improve recommended workup depending upon goals of care, palliative care status.  I reached out to patient's legal guardian [name] on 06/28/2024 and had a prolonged discussion about treatment plan.  The decision is to pursue with 2 weeks of IV antibiotics so as not have to have her go through invasive testing like TEE.  Repeat blood cultures have been sent at 06/28/2024  with no growth in 24 hours or less.  Will consider transthoracic echocardiogram at this time.  Will consider PICC line placement if cultures negative on repeat.  I have informed TOC/hospice team regarding antibiotic plan is in process of discussion.  Wound 06/26/24 1322 Pressure Injury Sacrum Left Unstageable - Full thickness tissue loss in which the base of the injury is covered by slough (yellow, tan, gray, green or brown) and/or eschar (tan, brown or black) in the wound bed. (Active)     Wound 06/26/24 1323 Pressure Injury Buttocks Left Deep Tissue Pressure Injury - Purple or maroon localized area of discolored intact skin or blood-filled blister due to damage of underlying soft tissue from pressure and/or shear. (Active)     Elevated troponin-likely related to sepsis.    No chest pain or EKG changes.   Advanced dementia.-continue to monitor, delirium precautions, currently on palliative care hospice.    Paroxysmal A-fib-currently not on rate control or anticoagulation.     Essential hypertension-not on any antihypertensives.  Will monitor  Spinal compression fractures-continue to monitor.  Analgesics as needed.   History of COPD/pulmonary fibrosis-continue bronchodilators.  Appears to be compensated.  Hypokalemia.  Potassium 3.3 today.  Will replenish orally.    Goals of care-patient with palliative care hospice services.  DVT prophylaxis: enoxaparin  (LOVENOX ) injection 40 mg Start: 06/25/24 2315   Disposition: Skilled nursing facility with hospice  Status is: Inpatient Remains inpatient appropriate because: Need for IV antibiotics, pending clinical improvement,    Code Status:     Code Status: Limited: Do not attempt resuscitation (DNR) -DNR-LIMITED -Do Not Intubate/DNI   Family Communication: Spoke with the patient's legal guardian on 06/28/2024  Consultants: Infectious disease-curbside consultation.  Procedures: None  Anti-infectives:  Cefazolin   IV  Anti-infectives (From admission, onward)    Start     Dose/Rate Route Frequency Ordered Stop   06/27/24 1200  vancomycin  (VANCOREADY) IVPB 1250 mg/250 mL  Status:  Discontinued        1,250 mg 166.7 mL/hr over 90 Minutes Intravenous Every 36 hours 06/26/24 0147 06/26/24 1157   06/26/24 1400  ceFAZolin  (ANCEF ) IVPB 2g/100 mL premix        2 g 200 mL/hr over 30 Minutes Intravenous Every 8 hours 06/26/24 1157     06/26/24 0800  cefTRIAXone  (ROCEPHIN ) 2 g in sodium chloride  0.9 % 100 mL IVPB  Status:  Discontinued        2 g 200 mL/hr over 30 Minutes Intravenous Every 24 hours 06/26/24 0137 06/26/24 1157   06/25/24 2330  vancomycin  (VANCOREADY) IVPB 1500 mg/300 mL        1,500 mg 150 mL/hr over 120 Minutes Intravenous  Once 06/25/24 2303 06/26/24 0256   06/25/24 2300  vancomycin  (VANCOCIN ) IVPB 1000 mg/200 mL premix  Status:  Discontinued        1,000 mg 200 mL/hr over 60 Minutes Intravenous  Once 06/25/24 2259 06/25/24 2303   06/25/24 2300  ceFEPIme  (MAXIPIME ) 2 g in sodium chloride  0.9 % 100 mL IVPB  Status:  Discontinued        2 g 200 mL/hr over 30 Minutes Intravenous  Once 06/25/24 2259 06/26/24 0137   06/25/24 1915  cefTRIAXone  (ROCEPHIN ) 1 g in sodium chloride  0.9 % 100 mL IVPB        1 g 200 mL/hr over 30 Minutes Intravenous  Once 06/25/24 1908 06/25/24 2032      Subjective: Today, patient was seen and examined at bedside.  Patient alert awake but not much interactive.  No interval complaints reported.    Objective: Vitals:   06/29/24 0544 06/29/24 1142  BP: (!) 152/98 (!) 129/105  Pulse: 60 97  Resp: 18 18  Temp: (!) 97.5 F (36.4 C) 97.8 F (36.6 C)  SpO2: 95% 97%   No intake or output data in the 24 hours ending 06/29/24 1258  There were no vitals filed for this visit. There is no height or weight on file to calculate BMI.   Physical Exam: General: Alert awake, elderly female, appears chronically ill,, not in obvious distress, on room air HENT:   No  scleral pallor or icterus noted. Oral mucosa is moist.  Chest:   Diminished breath sounds bilaterally. No crackles or wheezes.  CVS: S1 &S2 heard. No murmur.  Regular rate and rhythm. Abdomen: Soft, nontender, nondistended.  Bowel sounds are heard.  Foley catheter in place. Extremities: No cyanosis, clubbing or edema.  Peripheral pulses are palpable. Psych: Alert, awake and Communicative, has underlying cognitive dysfunction, CNS:  No cranial nerve deficits.  Moves extremities Skin: Warm and dry, skin without wounds.   Data Review: I have personally reviewed the following laboratory data and studies,  CBC: Recent Labs  Lab 06/25/24 1800 06/27/24 0550 06/28/24 1202 06/29/24 0554  WBC 26.8* 18.2* 13.9* 10.7*  NEUTROABS 25.1*  --   --   --   HGB 10.8* 9.0* 9.7* 10.5*  HCT 34.7* 29.9* 30.1* 32.7*  MCV 100.0 96.1 95.3 96.2  PLT 213 157 199 213   Basic Metabolic Panel: Recent Labs  Lab 06/25/24 1800 06/27/24 0550 06/29/24 0554  NA 138 142 142  K 5.0 3.8 3.3*  CL 102 107 105  CO2 24 24 25   GLUCOSE  119* 101* 83  BUN 33* 27* 21  CREATININE 1.36* 0.95 0.65  CALCIUM  9.6 8.7* 8.7*  MG  --  2.3 2.1  PHOS  --  2.8  --    Liver Function Tests: Recent Labs  Lab 06/25/24 1800  AST 28  ALT 10  ALKPHOS 161*  BILITOT 0.8  PROT 7.2  ALBUMIN 3.4*   No results for input(s): LIPASE, AMYLASE in the last 168 hours. No results for input(s): AMMONIA in the last 168 hours. Cardiac Enzymes: No results for input(s): CKTOTAL, CKMB, CKMBINDEX, TROPONINI in the last 168 hours. BNP (last 3 results) Recent Labs    08/20/23 1934  BNP 233.9*    ProBNP (last 3 results) Recent Labs    06/25/24 1800  PROBNP 1,039.0*    CBG: No results for input(s): GLUCAP in the last 168 hours. Recent Results (from the past 240 hours)  Blood Culture (routine x 2)     Status: Abnormal   Collection Time: 06/25/24  6:00 PM   Specimen: BLOOD RIGHT ARM  Result Value Ref Range Status    Specimen Description   Final    BLOOD RIGHT ARM Performed at [facility], [address], [place], [place] [address]    Special Requests   Final    BOTTLES DRAWN AEROBIC AND ANAEROBIC Blood Culture adequate volume Performed at [facility], [address], [place], [place] [address]    Culture  Setup Time   Final    GRAM POSITIVE COCCI IN BOTH AEROBIC AND ANAEROBIC BOTTLES CRITICAL RESULT CALLED TO, READ BACK BY AND VERIFIED WITH: PHARMD Utomwen A on 100425 @1045  by SM Performed at [facility], [address], [place], [place] [address]    Culture STAPHYLOCOCCUS AUREUS (A)  Final   Report Status 06/28/2024 FINAL  Final   Organism ID, Bacteria STAPHYLOCOCCUS AUREUS  Final      Susceptibility   Staphylococcus aureus - MIC*    CIPROFLOXACIN  >=8 RESISTANT Resistant     ERYTHROMYCIN >=8 RESISTANT Resistant     GENTAMICIN <=0.5 SENSITIVE Sensitive     OXACILLIN 0.5 SENSITIVE Sensitive     TETRACYCLINE <=1 SENSITIVE Sensitive     VANCOMYCIN  1 SENSITIVE Sensitive     TRIMETH /SULFA  >=320 RESISTANT Resistant     CLINDAMYCIN  <=0.25 SENSITIVE Sensitive     RIFAMPIN <=0.5 SENSITIVE Sensitive     Inducible Clindamycin  NEGATIVE Sensitive     LINEZOLID 2 SENSITIVE Sensitive     * STAPHYLOCOCCUS AUREUS  Blood Culture ID Panel (Reflexed)     Status: Abnormal   Collection Time: 06/25/24  6:00 PM  Result Value Ref Range Status   Enterococcus faecalis NOT DETECTED NOT DETECTED Final   Enterococcus Faecium NOT DETECTED NOT DETECTED Final   Listeria monocytogenes NOT DETECTED NOT DETECTED Final   Staphylococcus species DETECTED (A) NOT DETECTED Final    Comment: PHARMD Utomwen A on 100425 @1045  by SM   Staphylococcus aureus (BCID) DETECTED (A) NOT DETECTED Final    Comment: PHARMD Utomwen A on 100425 @1045  by SM   Staphylococcus epidermidis NOT DETECTED NOT DETECTED Final   Staphylococcus lugdunensis NOT DETECTED NOT DETECTED Final   Streptococcus species NOT  DETECTED NOT DETECTED Final   Streptococcus agalactiae NOT DETECTED NOT DETECTED Final   Streptococcus pneumoniae NOT DETECTED NOT DETECTED Final   Streptococcus pyogenes NOT DETECTED NOT DETECTED Final   A.calcoaceticus-baumannii NOT DETECTED NOT DETECTED Final   Bacteroides fragilis NOT DETECTED NOT DETECTED Final  Enterobacterales NOT DETECTED NOT DETECTED Final   Enterobacter cloacae complex NOT DETECTED NOT DETECTED Final   Escherichia coli NOT DETECTED NOT DETECTED Final   Klebsiella aerogenes NOT DETECTED NOT DETECTED Final   Klebsiella oxytoca NOT DETECTED NOT DETECTED Final   Klebsiella pneumoniae NOT DETECTED NOT DETECTED Final   Proteus species NOT DETECTED NOT DETECTED Final   Salmonella species NOT DETECTED NOT DETECTED Final   Serratia marcescens NOT DETECTED NOT DETECTED Final   Haemophilus influenzae NOT DETECTED NOT DETECTED Final   Neisseria meningitidis NOT DETECTED NOT DETECTED Final   Pseudomonas aeruginosa NOT DETECTED NOT DETECTED Final   Stenotrophomonas maltophilia NOT DETECTED NOT DETECTED Final   Candida albicans NOT DETECTED NOT DETECTED Final   Candida auris NOT DETECTED NOT DETECTED Final   Candida glabrata NOT DETECTED NOT DETECTED Final   Candida krusei NOT DETECTED NOT DETECTED Final   Candida parapsilosis NOT DETECTED NOT DETECTED Final   Candida tropicalis NOT DETECTED NOT DETECTED Final   Cryptococcus neoformans/gattii NOT DETECTED NOT DETECTED Final   Meth resistant mecA/C and MREJ NOT DETECTED NOT DETECTED Final    Comment: Performed at [facility], [address], [place], [place] [address]  Blood Culture (routine x 2)     Status: None (Preliminary result)   Collection Time: 06/26/24  8:49 AM   Specimen: BLOOD LEFT ARM  Result Value Ref Range Status   Specimen Description   Final    BLOOD LEFT ARM Performed at [facility], [address] [place], [place] [address]    Special Requests   Final    BOTTLES DRAWN AEROBIC AND  ANAEROBIC Blood Culture results may not be optimal due to an inadequate volume of blood received in culture bottles Performed at [facility], [address] [place], [place] [address]    Culture   Final    NO GROWTH 3 DAYS Performed at [facility], [address] [place], [place] [address]    Report Status PENDING  Incomplete  Culture, blood (Routine X 2) w Reflex to ID Panel     Status: None (Preliminary result)   Collection Time: 06/28/24 11:10 AM   Specimen: BLOOD LEFT ARM  Result Value Ref Range Status   Specimen Description   Final    BLOOD LEFT ARM Performed at [facility], [address] [place], [place] [address]    Special Requests   Final    BOTTLES DRAWN AEROBIC ONLY Blood Culture results may not be optimal due to an inadequate volume of blood received in culture bottles Performed at [facility], [address], [place], [place] [address]    Culture   Final    NO GROWTH < 24 HOURS Performed at [facility], [address], [place], [place] [address]    Report Status PENDING  Incomplete  Culture, blood (Routine X 2) w Reflex to ID Panel     Status: None (Preliminary result)   Collection Time: 06/28/24 11:15 AM   Specimen: BLOOD LEFT HAND  Result Value Ref Range Status   Specimen Description   Final    BLOOD LEFT HAND Performed at [facility], [address] [place], [place] [address]    Special Requests   Final    BOTTLES DRAWN AEROBIC ONLY Blood Culture results may not be optimal due to an inadequate volume of blood received in culture bottles Performed at [facility], [address] [place], [place] [address]    Culture   Final    NO GROWTH < 24 HOURS Performed at [facility], [address] [place], [place] [address]    Report Status PENDING  Incomplete     Studies: No results found.   Vernal Alstrom, MD  [facility] 06/29/2024  If 7PM-7AM, please  contact night-coverage

## 2024-06-29 NOTE — Progress Notes (Signed)
 Tracy Dickerson [address] Hospitalized Hospice Patient Visit   Tracy Dickerson is a current hospice patient with [facility] a terminal diagnosis of cerebrovascular disease.  Patient resides at [facility] and was sent to hospital due to fever of 104 as well as increased lethargy and weakness. Facility notified [facility] that patient had been sent to ED. Patient was admitted on 06/25/2024 with a diagnosis of sepsis. Per Dr. Hosie with [facility], this is a hospice approved hospitalization.   Visited patient at bedside. Patient is awake and alert.  She does not verbally respond until the end of my visit.  She denies pain and has no needs at this time.  Breakfast was bedside and patient had not eaten it.   Patient remains GIP appropriate with need for IV antibiotic management of sepsis.    Vital Signs: 97.9/60/18   152/98   SpO2 95% Room Air   I&O:  -900   Abnormal Labs: Potassium 3.3, WBC 10.7, Hgb 10.5, HCT 32.7   Diagnostics:   None new     IV/PRN Meds-  Ancef  2g IV Q8H     Current plan as per Sonjia Held, MD 10.7.25  Assessment/Plan: Principal Problem:   Sepsis (HCC)   Pressure injury sacrum left unstageable with cellulitis. Pressure injury left buttocks deep tissue injury. Sepsis most likely secondary to sacral cellulitis Staff aureus bacteremia. Patient presented confusion, fever and lactic acidosis on presentation.  Lactic acid improved after IV fluids.  CT abdomen pelvis showed subcutaneous edema and soft tissue swelling consistent with cellulitis of gluteal tissues.  Was initially on vancomycin  and Rocephin , Blood cultures showing Staph aureus in 1 bottle.  Currently on IV cefazolin .  Communicated with ID Dr Dea to improve recommended workup depending upon goals of care, palliative care status.  I reached out to patient's legal guardian Ms. Tracy Dickerson on 06/28/2024 and had a prolonged discussion about treatment plan.  The decision is to pursue with 2  weeks of IV antibiotics so as not have to have her go through invasive testing like TEE.  Repeat blood cultures have been sent at 06/28/2024 with no growth in 24 hours or less.  Will consider transthoracic echocardiogram at this time.  Will consider PICC line placement if cultures negative on repeat.  I have informed TOC/hospice team regarding antibiotic plan is in process of discussion.   Wound 06/26/24 1322 Pressure Injury Sacrum Left Unstageable - Full thickness tissue loss in which the base of the injury is covered by slough (yellow, tan, gray, green or brown) and/or eschar (tan, brown or black) in the wound bed. (Active)     Wound 06/26/24 1323 Pressure Injury Buttocks Left Deep Tissue Pressure Injury - Purple or maroon localized area of discolored intact skin or blood-filled blister due to damage of underlying soft tissue from pressure and/or shear. (Active)      Elevated troponin-likely related to sepsis.    No chest pain or EKG changes. Advanced dementia.-continue to monitor, delirium precautions, currently on palliative care hospice. Paroxysmal A-fib-currently not on rate control or anticoagulation.   Essential hypertension-not on any antihypertensives.  Will monitor Spinal compression fractures-continue to monitor.  Analgesics as needed. History of COPD/pulmonary fibrosis-continue bronchodilators.  Appears to be compensated.  Hypokalemia.  Potassium 3.3 today.  Will replenish orally.  Goals of care- DNR.     Discharge Planning- Ongoing, Pending medical stabilization   Family Contact:  called Tracy (legal guardian) to discuss discharge plan.  No concerns at this time.   IDT: Updated  Goals of Care: DNR   Should patient require EMS transport at discharge please reach out to [facility] as we contract with them for this service.  Thank you for the opportunity to participate in this patient's care, please don't hesitate to call for any hospice related questions or concerns.    Tracy Dickerson, BSN, [facility] Liaison [phone]

## 2024-06-29 NOTE — TOC Progression Note (Addendum)
 Transition of Care [facility]) - Progression Note    Patient Details  Name: Tracy Dickerson MRN: [id number] Date of Birth: May 24, 1941  Transition of Care [facility]) CM/SW Contact  Doneta Glenys DASEN, RN Phone Number: 06/29/2024, 10:29 AM  Clinical Narrative:    CM has called [facility] on 4 separate occasions. Only able to leave 1 message due to line either dropped or mail box full. CM called Olam Pouch (LG) [phone], left message. 2:10 PM CM called [employer], she said [place] will not be able to manage IV antibiotic. The options maybe to go SNF for medical treatment or keep here.  Dr. Sonjia update via chat.   Expected Discharge Plan: Memory Care [facility]) Barriers to Discharge: Continued Medical Work up               Expected Discharge Plan and Services In-house Referral: NA Discharge Planning Services: CM Consult   Living arrangements for the past 2 months: Skilled Nursing Facility                 DME Arranged: N/A DME Agency: NA       HH Arranged: NA HH Agency: NA         Social Drivers of Health (SDOH) Interventions SDOH Screenings   Food Insecurity: No Food Insecurity (06/26/2024)  Housing: Low Risk  (06/26/2024)  Transportation Needs: Patient Unable To Answer (06/02/2024)  Utilities: Not At Risk (11/21/2023)  Financial Resource Strain: Low Risk  (02/04/2023)   Received from [facility]  Physical Activity: Inactive (07/16/2021)   Received from [facility]  Social Connections: Patient Unable To Answer (06/26/2024)  Stress: No Stress Concern Present (07/16/2021)   Received from [facility]  Tobacco Use: Medium Risk (06/04/2024)  Health Literacy: Medium Risk (07/16/2021)   Received from [facility]    Readmission Risk Interventions    03/27/2024   11:14 AM  Readmission Risk Prevention Plan  Transportation Screening Complete  Medication Review (RN Care Manager) Complete  PCP or Specialist appointment  within 3-5 days of discharge Complete  HRI or Home Care Consult Complete  SW Recovery Care/Counseling Consult Complete  Palliative Care Screening Not Applicable  Skilled Nursing Facility Not Applicable

## 2024-06-29 NOTE — Plan of Care (Signed)

## 2024-06-30 ENCOUNTER — Other Ambulatory Visit (HOSPITAL_COMMUNITY)

## 2024-06-30 DIAGNOSIS — R627 Adult failure to thrive: Secondary | ICD-10-CM | POA: Diagnosis not present

## 2024-06-30 DIAGNOSIS — F01C11 Vascular dementia, severe, with agitation: Secondary | ICD-10-CM | POA: Diagnosis not present

## 2024-06-30 DIAGNOSIS — A4101 Sepsis due to Methicillin susceptible Staphylococcus aureus: Principal | ICD-10-CM

## 2024-06-30 LAB — CBC
HCT: 32.2 % — ABNORMAL LOW (ref 36.0–46.0)
Hemoglobin: 10.5 g/dL — ABNORMAL LOW (ref 12.0–15.0)
MCH: 31 pg (ref 26.0–34.0)
MCHC: 32.6 g/dL (ref 30.0–36.0)
MCV: 95 fL (ref 80.0–100.0)
Platelets: 248 K/uL (ref 150–400)
RBC: 3.39 MIL/uL — ABNORMAL LOW (ref 3.87–5.11)
RDW: 13.9 % (ref 11.5–15.5)
WBC: 11.5 K/uL — ABNORMAL HIGH (ref 4.0–10.5)
nRBC: 0 % (ref 0.0–0.2)

## 2024-06-30 LAB — BASIC METABOLIC PANEL WITH GFR
Anion gap: 12 (ref 5–15)
BUN: 18 mg/dL (ref 8–23)
CO2: 24 mmol/L (ref 22–32)
Calcium: 8.6 mg/dL — ABNORMAL LOW (ref 8.9–10.3)
Chloride: 105 mmol/L (ref 98–111)
Creatinine, Ser: 0.67 mg/dL (ref 0.44–1.00)
GFR, Estimated: >60 mL/min (ref >60)
Glucose, Bld: 92 mg/dL (ref 70–99)
Potassium: 3 mmol/L — ABNORMAL LOW (ref 3.5–5.1)
Sodium: 141 mmol/L (ref 135–145)

## 2024-06-30 LAB — MAGNESIUM: Magnesium: 2.2 mg/dL (ref 1.7–2.4)

## 2024-06-30 MED ORDER — LINEZOLID 600 MG PO TABS: 600.0000 mg | ORAL_TABLET | Freq: Two times a day (BID) | ORAL | 0 refills | Status: AC

## 2024-06-30 MED ORDER — LORAZEPAM 0.5 MG PO TABS: 0.5000 mg | ORAL_TABLET | Freq: Four times a day (QID) | ORAL | 0 refills | Status: AC | PRN

## 2024-06-30 MED ORDER — GABAPENTIN 300 MG PO CAPS: 300.0000 mg | ORAL_CAPSULE | Freq: Every day | ORAL | 0 refills | Status: AC

## 2024-06-30 MED ORDER — TRAMADOL HCL 50 MG PO TABS: 50.0000 mg | ORAL_TABLET | Freq: Four times a day (QID) | ORAL | 0 refills | Status: AC | PRN

## 2024-06-30 NOTE — Evaluation (Signed)
 Physical Therapy Brief Evaluation and Discharge Note Patient Details Name: Tracy Dickerson MRN: [id number] DOB: Mar 02, 1941 Today's Date: 06/30/2024   History of Present Illness  Tracy Dickerson is a 83 y.o. female presents to therapy following hospital admission 06/25/2024 due to fever, increased confusion, tachycardia, and multiple wounds on sacrum (unstageable) and buttocks with cellulitis. Pt found to be septic secondary to bacteremia/cellulitis. Pt hospitalized in September of this year and presented with increased confusion over her baseline and found to have evidence of recurrent pneumonia and COVID; as well as T12 and L5 compression fxs.  PHX includes but is not limited to:   dementia, GERD, hypertension, atrial fibrillation, cancer (breast and lung), DM2, heart disease, and sepsis due to pneumonia earlier 03/2024.  Clinical Impression      Patient evaluated by Physical Therapy with no further acute PT needs identified. Pt presents with a significant functional decline from 03/2024. Pt in bed when PT arrived. Pt agreeable to therapy intervention pt able respond yes and no to questions and intermittently appropriate. Pt required total A for supine <> sit, max A x 2 for sitting balance EOB and pt buttock/sacral region offloaded with use of pillows in L side lying. During the course of PT eval MD arrived and reported pt is to d/c to residential hospices. Pt is to be transported via [facility] to hospice home in [place].  PT is signing off. Thank you for this referral.     PT Assessment    Assistance Needed at Discharge       Equipment Recommendations None recommended by PT  Recommendations for Other Services       Precautions/Restrictions Precautions Precautions: Fall;Back Precaution Booklet Issued: No Recall of Precautions/Restrictions: Impaired Precaution/Restrictions Comments: T12 Vertebral, L 5 Vertebral and L1  Comp Fx Restrictions Weight Bearing Restrictions Per Provider Order: No         Mobility  Bed Mobility Rolling: Total assist, +2 for physical assistance        Transfers                   General transfer comment: unable    Ambulation/Gait           General Gait Details: Nt  Home Activity Instructions    Stairs            Modified Rankin (Stroke Patients Only)        Balance   Sitting-balance support: Bilateral upper extremity supported, Feet supported Sitting balance-Leahy Scale: Zero Sitting balance - Comments: posterior lean, pushing response and anterior pelvic tilt absent self righting stratagies                  Pertinent Vitals/Pain   Pain Assessment Pain Assessment: Faces Faces Pain Scale: Hurts a little bit Breathing: occasional labored breathing, short period of hyperventilation Negative Vocalization: occasional moan/groan, low speech, negative/disapproving quality Facial Expression: facial grimacing Body Language: relaxed Consolability: no need to console PAINAD Score: 4 Pain Location: back, buttocks Pain Descriptors / Indicators: Constant, Discomfort     Home Living               Additional Comments: [facility]    Prior Function        UE/LE Assessment               Communication   Communication Communication: Impaired Factors Affecting Communication: Difficulty expressing self     Cognition         General Comments  Exercises     Assessment/Plan    PT Problem List Decreased strength;Decreased range of motion;Decreased activity tolerance;Decreased balance;Decreased mobility;Decreased coordination;Decreased cognition;Decreased knowledge of use of DME;Decreased safety awareness;Decreased knowledge of precautions;Pain       PT Visit Diagnosis Other abnormalities of gait and mobility (R26.89);Muscle weakness (generalized) (M62.81)    No Skilled PT     Co-evaluation                AMPAC 6 Clicks Help needed turning from your back  to your side while in a flat bed without using bedrails?: Total Help needed moving from lying on your back to sitting on the side of a flat bed without using bedrails?: Total Help needed moving to and from a bed to a chair (including a wheelchair)?: Total Help needed standing up from a chair using your arms (e.g., wheelchair or bedside chair)?: Total Help needed to walk in hospital room?: Total Help needed climbing 3-5 steps with a railing? : Total 6 Click Score: 6      End of Session   Activity Tolerance: Patient limited by lethargy;Other (comment) (cognition and generalized weakness) Patient left: in bed;with call bell/phone within reach Nurse Communication: Mobility status PT Visit Diagnosis: Other abnormalities of gait and mobility (R26.89);Muscle weakness (generalized) (M62.81)     Time: 8872-8861 PT Time Calculation (min) (ACUTE ONLY): 11 min  Charges:   PT Evaluation $PT Eval Moderate Complexity: 1 Cora Factor, PT Acute Rehab   MYLENE BOW  06/30/2024, 12:33 PM

## 2024-06-30 NOTE — Plan of Care (Signed)

## 2024-06-30 NOTE — TOC Progression Note (Addendum)
 Transition of Care [facility]) - Progression Note    Patient Details  Name: Tracy Dickerson MRN: [id number] Date of Birth: 09-17-1941  Transition of Care [facility]) CM/SW Contact  Doneta Glenys DASEN, RN Phone Number: 06/30/2024, 9:15 AM  Clinical Narrative:    [name] with Hospice is communicating with Olam (LG) to develop a plan for discharge.  11:32 AM CM spoke with Olam Pouch (LG) [phone] to confirm consent for transfer to [facility] in [place]. Olam stated she will call [facility] and any family member of discharge plan.  Expected Discharge Plan: Memory Care [facility]) Barriers to Discharge: Continued Medical Work up               Expected Discharge Plan and Services In-house Referral: NA Discharge Planning Services: CM Consult   Living arrangements for the past 2 months: Skilled Nursing Facility                 DME Arranged: N/A DME Agency: NA       HH Arranged: NA HH Agency: NA         Social Drivers of Health (SDOH) Interventions SDOH Screenings   Food Insecurity: No Food Insecurity (06/26/2024)  Housing: Low Risk  (06/26/2024)  Transportation Needs: Patient Unable To Answer (06/02/2024)  Utilities: Not At Risk (11/21/2023)  Financial Resource Strain: Low Risk  (02/04/2023)   Received from [facility]  Physical Activity: Inactive (07/16/2021)   Received from [facility]  Social Connections: Patient Unable To Answer (06/26/2024)  Stress: No Stress Concern Present (07/16/2021)   Received from [facility]  Tobacco Use: Medium Risk (06/04/2024)  Health Literacy: Medium Risk (07/16/2021)   Received from [facility]    Readmission Risk Interventions    03/27/2024   11:14 AM  Readmission Risk Prevention Plan  Transportation Screening Complete  Medication Review (RN Care Manager) Complete  PCP or Specialist appointment within 3-5 days of discharge Complete  HRI or Home Care Consult Complete  SW Recovery Care/Counseling  Consult Complete  Palliative Care Screening Not Applicable  Skilled Nursing Facility Not Applicable

## 2024-06-30 NOTE — Discharge Summary (Signed)
 Physician Discharge Summary  Tracy Dickerson FMW:[id number] DOB: 1941/09/15 DOA: 06/25/2024  PCP: Default, Provider, MD  Admit date: 06/25/2024 Discharge date: 06/30/2024  Admitted From: Memory care Disposition: Inpatient hospice  Recommendations for Outpatient Follow-up:  As per hospice provider   Discharge Condition: Poor CODE STATUS: DNR comfort care Diet recommendation: As tolerated  Discharge summary: 83 year old female with history of breast cancer, lung cancer, hypertension, hyperlipidemia, A-fib, dementia, recent hospitalization for worsening dementia confusion and pneumonia brought back to the emergency room with confusion, tachycardic and febrile.  She was initially found with a stable blood pressure, WBC count 26.8.  Lactic acid 2.6.  Urinalysis was negative.  She was found to have pressure injury of the sacrum with unstageable ulcer and cellulitis.  She also grew a Staph aureus bacteremia.  Patient remains in very poor clinical status.  After much clinical conversations, it was decided that she will be best served with inpatient hospice.  Pressure injury of the sacrum with cellulitis Pressure injury left buttocks deep tissue injury Sepsis present on admission secondary to Staph aureus bacteremia from pressure injury: Failure to thrive Advanced dementia  Patient remained in very poor clinical status.  Decided to serve her with comfort care at inpatient hospice.  Patient is stable to transfer today. Diagnosed with MSSA bacteremia-on Ancef  in the hospital-repeat cultures negative.  Goal of care discussions, decided not to pursue with IV antibiotics or PICC line but rather pursue with comfort care. Will treat with linezolid for 2 weeks. Other symptom control medications including Ativan  and tramadol  prescribed. Stable to transfer to hospice home for end-of-life care.   Discharge Diagnoses:  Principal Problem:   Sepsis [facility])    Discharge Instructions  Discharge Instructions      Diet general   Complete by: As directed    Discharge wound care:   Complete by: As directed    Wound care  Daily      Comments: 1.        Cleanse sacral wound with Vashe wound cleanser Soila [phone]) do not rinse and allow to air dry.  Apply 1/4 thick layer of Santyl to wound bed, top with saline moist gauze, dry gauze and silicone foam or ABD pad and tape whichever is preferred.  2.Cleanse L buttock wound with Vashe, apply Xeroform gauze Soila [phone]) to wound bed daily and secure with silicone foam      Allergies as of 06/30/2024       Reactions   Oxycodone  Itching   Penicillins Rash   Percocet [oxycodone -acetaminophen ] Itching        Medication List     STOP taking these medications    bisacodyl  10 MG suppository Commonly known as: DULCOLAX   Eliquis  5 MG Tabs tablet Generic drug: apixaban    hydrocortisone  5 MG tablet Commonly known as: CORTEF    QUEtiapine  300 MG tablet Commonly known as: SEROQUEL        TAKE these medications    acetaminophen  500 MG tablet Commonly known as: TYLENOL  Take 500 mg by mouth in the morning and at bedtime.   acetaminophen  500 MG tablet Commonly known as: TYLENOL  Take 500 mg by mouth every 6 (six) hours as needed for mild pain (pain score 1-3) or moderate pain (pain score 4-6).   albuterol  (2.5 MG/3ML) 0.083% nebulizer solution Commonly known as: PROVENTIL  Take 3 mLs (2.5 mg total) by nebulization every 4 (four) hours as needed for wheezing or shortness of breath.   cetirizine  5 MG tablet Commonly known as: ZYRTEC  Take  5 mg by mouth daily.   fluticasone -salmeterol 250-50 MCG/ACT Aepb Commonly known as: ADVAIR Inhale 1 puff into the lungs in the morning and at bedtime.   gabapentin  300 MG capsule Commonly known as: NEURONTIN  Take 1 capsule (300 mg total) by mouth at bedtime.   Geri-Tussin 100 MG/5ML liquid Generic drug: guaiFENesin  Take 10 mLs by mouth every 4 (four) hours as needed for cough or to loosen  phlegm.   linezolid 600 MG tablet Commonly known as: ZYVOX Take 1 tablet (600 mg total) by mouth 2 (two) times daily for 14 days.   LORazepam  0.5 MG tablet Commonly known as: ATIVAN  Take 1 tablet (0.5 mg total) by mouth every 6 (six) hours as needed for anxiety or sleep. What changed:  when to take this reasons to take this   nystatin  powder Commonly known as: MYCOSTATIN /NYSTOP  Apply 1 Application topically 2 (two) times daily. Apply to rash in breast area   pantoprazole  40 MG tablet Commonly known as: PROTONIX  Take 40 mg by mouth daily.   senna-docusate 8.6-50 MG tablet Commonly known as: Senokot-S Take 2 tablets by mouth 2 (two) times daily.   Spiriva  HandiHaler 18 MCG inhalation capsule Generic drug: tiotropium Place 1 capsule (18 mcg total) into inhaler and inhale daily.   traMADol  50 MG tablet Commonly known as: ULTRAM  Take 1 tablet (50 mg total) by mouth every 6 (six) hours as needed for up to 5 days for moderate pain (pain score 4-6). What changed:  when to take this reasons to take this               Discharge Care Instructions  (From admission, onward)           Start     Ordered   06/30/24 0000  Discharge wound care:       Comments: Wound care  Daily      Comments: 1.        Cleanse sacral wound with Vashe wound cleanser Soila [phone]) do not rinse and allow to air dry.  Apply 1/4 thick layer of Santyl to wound bed, top with saline moist gauze, dry gauze and silicone foam or ABD pad and tape whichever is preferred.  2.Cleanse L buttock wound with Vashe, apply Xeroform gauze (Lawson [phone]) to wound bed daily and secure with silicone foam   06/30/24 1144            Allergies  Allergen Reactions   Oxycodone  Itching   Penicillins Rash   Percocet [Oxycodone -Acetaminophen ] Itching    Consultations: Hospice   Procedures/Studies: CT ABDOMEN PELVIS W CONTRAST Result Date: 06/25/2024 CLINICAL DATA:  Sepsis fever weakness EXAM: CT ABDOMEN  AND PELVIS WITH CONTRAST TECHNIQUE: Multidetector CT imaging of the abdomen and pelvis was performed using the standard protocol following bolus administration of intravenous contrast. RADIATION DOSE REDUCTION: This exam was performed according to the departmental dose-optimization program which includes automated exposure control, adjustment of the mA and/or kV according to patient size and/or use of iterative reconstruction technique. CONTRAST:  80mL OMNIPAQUE  IOHEXOL  300 MG/ML  SOLN COMPARISON:  CT 06/01/2024, 03/26/2024, 11/01/2023, 08/11/2023 FINDINGS: Lower chest: Lung bases demonstrate chronic interstitial lung disease with fibrosis and bronchiectasis. Scarring at the right base. No acute superimposed airspace disease. Coronary vascular calcification. Mild cardiomegaly. Hepatobiliary: No focal liver abnormality is seen. No gallstones, gallbladder wall thickening, or biliary dilatation. Pancreas: Unremarkable. No pancreatic ductal dilatation or surrounding inflammatory changes. Fatty atrophy Spleen: Normal in size without focal abnormality. Adrenals/Urinary Tract: Adrenal glands are  within normal limits. Multiple renal cysts for which no specific imaging follow-up is recommended. Presumed exophytic complex cyst mid left kidney series 2, image 31 stable compared to prior, no specific imaging follow-up is recommended. Decreased left hydronephrosis compared to prior CT. Mild right hydronephrosis persists but is also slightly diminished. No hydroureter or obstructing stone. Mild thick-walled appearance of the urinary bladder. Stomach/Bowel: Stomach within normal limits. No dilated small bowel. No acute bowel wall thickening. Moderate retained feces at the rectum. Vascular/Lymphatic: Aortic atherosclerosis. 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 No suspicious lymph nodes. Reproductive: Hysterectomy.  No suspicious adnexal mass Other: Negative for pelvic effusion or free air Musculoskeletal: Interim  development of subcutaneous edema and soft tissue stranding within the left gluteal soft tissues, this tracks towards the left posterior perianal region but there is no discrete abscess or definitive fistula. Findings are suggestive of cellulitis. Chronic compression deformity at L1 with retropulsion. Redemonstrated T12 fracture involves the superior endplate and upper aspect of the vertebra. Mild interval loss of height of the vertebral body compared to the prior CT, estimated at 20% centrally. There is mild paravertebral edema at T12. Redemonstrated fracture of the L5 vertebra, involves both the anterior and posterior aspect of vertebra as well as the superior endplate. Fracture may involve the right pedicle of L5 as well. Diffuse loss of the vertebral body height, similar compared to prior. About 5 mm retropulsion of the upper aspect of the vertebral body. There is continued paravertebral soft tissue edema. IMPRESSION: 1. Interim development of subcutaneous edema and soft tissue stranding within the left gluteal soft tissues, this tracks towards the left posterior perianal region but there is no discrete abscess or definitive fistula. Findings are suggestive of cellulitis. 2. Residual mild bilateral right greater than left hydronephrosis but overall decreased compared to the CT from September. Interval slight thick-walled appearance of the bladder, correlate for cystitis. 3. Chronic severe L1 fracture. Redemonstrated T12 fracture with slight interval increase in loss of vertebral body height and continued paravertebral edema. Redemonstrated burst type morphology L5 fracture, about 5 mm retropulsion of the upper vertebral body. Fracture may involve the right pedicle. No progressive loss of vertebral body height but continued paravertebral edema 4. Decreased left hydronephrosis compared to prior CT. Mild right hydronephrosis persists but is also slightly diminished. No hydroureter or obstructing stone. Mild  thick-walled appearance of the urinary bladder, correlate for cystitis. 5. Aortic atherosclerosis. 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 Recommend follow-up ultrasound every 3 years. (Ref.: J Vasc Surg. 2018; 67:2-77 and J Am Coll Radiol 2013;10(10):789-794.) 6. 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 Aortic Atherosclerosis (ICD10-I70.0). Electronically Signed   By: Luke Bun M.D.   On: 06/25/2024 21:55   DG Chest Port 1 View Result Date: 06/25/2024 CLINICAL DATA:  Possible sepsis EXAM: PORTABLE CHEST 1 VIEW COMPARISON:  06/01/2024, chest CT 03/26/2024, 06/01/2024, radiograph 03/26/2024 FINDINGS: Hypoventilatory changes. Postsurgical changes of the right lung with faintly visible chain sutures. Diffuse reticular opacity consistent with chronic interstitial lung disease and fibrosis. No superimposed acute airspace disease, effusion or pneumothorax. Stable cardiomediastinal silhouette. Clips in the left axilla IMPRESSION: Chronic interstitial lung disease and fibrosis. No superimposed acute airspace disease. Electronically Signed   By: Luke Bun M.D.   On: 06/25/2024 17:31   CT CHEST ABDOMEN PELVIS W CONTRAST Result Date: 06/01/2024 CLINICAL DATA:  Altered mental status.  Generalized abdominal pain. EXAM: CT CHEST, ABDOMEN, AND PELVIS WITH CONTRAST TECHNIQUE: Multidetector CT imaging of the  chest, abdomen and pelvis was performed following the standard protocol during bolus administration of intravenous contrast. RADIATION DOSE REDUCTION: This exam was performed according to the departmental dose-optimization program which includes automated exposure control, adjustment of the mA and/or kV according to patient size and/or use of iterative reconstruction technique. CONTRAST:  OMNIPAQUE  IOHEXOL  300 MG/ML  SOLN COMPARISON:  CT dated 03/26/2024. FINDINGS: CT CHEST FINDINGS Cardiovascular: There is no cardiomegaly or pericardial effusion.  Mild atherosclerotic calcification of the thoracic aorta. No aneurysmal dilatation or dissection. The origins of the great vessels of the aortic arch and the central pulmonary arteries appear patent. Mediastinum/Nodes: Mildly enlarged subcarinal lymph node measures 11 mm short axis. The esophagus is grossly unremarkable. No mediastinal fluid collection. Lungs/Pleura: There is diffuse interstitial coarsening and subpleural reticulation consistent with pulmonary fibrosis and interstitial lung disease. There is partial occlusion of the right lower lobe bronchus, likely related to mucous impaction or aspiration. An endobronchial lesion is not excluded. Patchy area of increased density at the right lung base may represent scarring/fibrosis or developing infiltrate. Interval resolution of the previously seen nodular density at the left lung base. There is no pleural effusion or pneumothorax. Musculoskeletal: Degenerative changes of the spine and osteopenia. There is fracture of the superior aspect of the T12 vertebra, new since the prior CT, likely acute. CT ABDOMEN PELVIS FINDINGS No intra-abdominal free air or free fluid. Hepatobiliary: The liver is unremarkable. No biliary dilatation. The gallbladder is unremarkable Pancreas: Unremarkable. No pancreatic ductal dilatation or surrounding inflammatory changes. Spleen: Normal in size without focal abnormality. Adrenals/Urinary Tract: The adrenal glands unremarkable. Bilateral renal cysts as seen previously. Follow-up left renal lesion as per recommendation of prior CT. There is mild bilateral hydronephrosis, right greater than left, and new since the prior CT. No obstructing stone. The urinary bladder is mildly distended and grossly unremarkable. Stomach/Bowel: There is no bowel obstruction. There is mild distal colonic diverticulosis. No active inflammatory changes. Appendectomy. Vascular/Lymphatic: Moderate aortoiliac atherosclerotic disease. There is a 3.1 cm infrarenal  abdominal aortic aneurysm. The IVC is unremarkable. No portal venous gas. There is no adenopathy. Reproductive: Hysterectomy. Other: None Musculoskeletal: Relatively similar appearance of L1 compression fracture and greater than 50% loss of vertebral body height with fragmentation of the superior endplate. Approximately 3 mm retropulsion of the superior posterior cortex. There is fracture of the L5 vertebra, new since the prior CT, acute. There is advanced osteopenia. IMPRESSION: 1. Acute fracture of the superior aspect of the T12 vertebra and L5 vertebra. 2. Relatively similar appearance of L1 compression fracture. 3. Partial occlusion of the right lower lobe bronchus, likely related to mucous impaction or aspiration. An endobronchial lesion is not excluded. 4. Patchy area of increased density at the right lung base may represent scarring/fibrosis or developing infiltrate. 5. Mild bilateral hydronephrosis, right greater than left, and new since the prior CT. No obstructing stone. 6.  Aortic Atherosclerosis (ICD10-I70.0). Electronically Signed   By: Vanetta Chou M.D.   On: 06/01/2024 15:40   CT Head Wo Contrast Result Date: 06/01/2024 CLINICAL DATA:  Altered mental status EXAM: CT HEAD WITHOUT CONTRAST TECHNIQUE: Contiguous axial images were obtained from the base of the skull through the vertex without intravenous contrast. RADIATION DOSE REDUCTION: This exam was performed according to the departmental dose-optimization program which includes automated exposure control, adjustment of the mA and/or kV according to patient size and/or use of iterative reconstruction technique. COMPARISON:  CT brain 03/26/2024 FINDINGS: Brain: No acute territorial infarction, hemorrhage or intracranial mass. Atrophy  and mild chronic small vessel ischemic changes of the white matter. Stable ventricle size. Vascular: No hyperdense vessels.  Carotid vascular calcification Skull: Normal. Negative for fracture or focal lesion.  Sinuses/Orbits: No acute finding. Other: None IMPRESSION: 1. No CT evidence for acute intracranial abnormality. 2. Atrophy and mild chronic small vessel ischemic changes of the white matter. Electronically Signed   By: Luke Bun M.D.   On: 06/01/2024 15:34   DG Chest Portable 1 View Result Date: 06/01/2024 CLINICAL DATA:  Altered level of consciousness EXAM: PORTABLE CHEST 1 VIEW COMPARISON:  03/26/2024 FINDINGS: Single frontal view of the chest demonstrates an unremarkable cardiac silhouette. Chronic areas of scarring and fibrosis are seen bilaterally within the lungs, right greater than left. No airspace disease, effusion, or pneumothorax. No acute bony abnormalities. IMPRESSION: 1. Chronic scarring and fibrosis.  No acute airspace disease. Electronically Signed   By: Ozell Daring M.D.   On: 06/01/2024 14:57   (Echo, Carotid, EGD, Colonoscopy, ERCP)    Subjective: Patient seen and examined.  Patient was anxious and was agitated on questioning.  Patient is not oriented.  She could not understand any plan of care.   Discharge Exam: Vitals:   06/29/24 2026 06/30/24 0622  BP:  (!) 154/71  Pulse: 74 (!) 108  Resp:  18  Temp:  98.4 F (36.9 C)  SpO2: 94% (!) 89%   Vitals:   06/29/24 1142 06/29/24 2008 06/29/24 2026 06/30/24 0622  BP: (!) 129/105 (!) 155/94  (!) 154/71  Pulse: 97 (!) 48 74 (!) 108  Resp: 18   18  Temp: 97.8 F (36.6 C) 97.7 F (36.5 C)  98.4 F (36.9 C)  TempSrc: Axillary Axillary  Oral  SpO2: 97% 95% 94% (!) 89%    General: Pt is alert, awake, mildly anxious.  Not oriented.  Not in any distress. Cardiovascular: RRR, S1/S2 +, no rubs, no gallops Respiratory: CTA bilaterally, no wheezing, no rhonchi Abdominal: Soft, NT, ND, bowel sounds + Extremities: no edema, no cyanosis Wound 06/26/24 1322 Pressure Injury Sacrum Left Unstageable - Full thickness tissue loss in which the base of the injury is covered by slough (yellow, tan, gray, green or brown) and/or eschar  (tan, brown or black) in the wound bed. (Active)     Wound 06/26/24 1323 Pressure Injury Buttocks Left Deep Tissue Pressure Injury - Purple or maroon localized area of discolored intact skin or blood-filled blister due to damage of underlying soft tissue from pressure and/or shear. (Active)        The results of significant diagnostics from this hospitalization (including imaging, microbiology, ancillary and laboratory) are listed below for reference.     Microbiology: Recent Results (from the past 240 hours)  Blood Culture (routine x 2)     Status: Abnormal   Collection Time: 06/25/24  6:00 PM   Specimen: BLOOD RIGHT ARM  Result Value Ref Range Status   Specimen Description   Final    BLOOD RIGHT ARM Performed at [facility], [address] [place], [place] [address]    Special Requests   Final    BOTTLES DRAWN AEROBIC AND ANAEROBIC Blood Culture adequate volume Performed at [facility], [address] [place], [place] [address]    Culture  Setup Time   Final    GRAM POSITIVE COCCI IN BOTH AEROBIC AND ANAEROBIC BOTTLES CRITICAL RESULT CALLED TO, READ BACK BY AND VERIFIED WITH: PHARMD Utomwen A on 100425 @1045  by SM Performed at [facility], [address], [place], [place] [address]    Culture STAPHYLOCOCCUS AUREUS (A)  Final   Report Status 06/28/2024 FINAL  Final   Organism ID, Bacteria STAPHYLOCOCCUS AUREUS  Final      Susceptibility   Staphylococcus aureus - MIC*    CIPROFLOXACIN  >=8 RESISTANT Resistant     ERYTHROMYCIN >=8 RESISTANT Resistant     GENTAMICIN <=0.5 SENSITIVE Sensitive     OXACILLIN 0.5 SENSITIVE Sensitive     TETRACYCLINE <=1 SENSITIVE Sensitive     VANCOMYCIN  1 SENSITIVE Sensitive     TRIMETH /SULFA  >=320 RESISTANT Resistant     CLINDAMYCIN  <=0.25 SENSITIVE Sensitive     RIFAMPIN <=0.5 SENSITIVE Sensitive     Inducible Clindamycin  NEGATIVE Sensitive     LINEZOLID 2 SENSITIVE Sensitive     * STAPHYLOCOCCUS AUREUS   Blood Culture ID Panel (Reflexed)     Status: Abnormal   Collection Time: 06/25/24  6:00 PM  Result Value Ref Range Status   Enterococcus faecalis NOT DETECTED NOT DETECTED Final   Enterococcus Faecium NOT DETECTED NOT DETECTED Final   Listeria monocytogenes NOT DETECTED NOT DETECTED Final   Staphylococcus species DETECTED (A) NOT DETECTED Final    Comment: PHARMD Utomwen A on 100425 @1045  by SM   Staphylococcus aureus (BCID) DETECTED (A) NOT DETECTED Final    Comment: PHARMD Utomwen A on 100425 @1045  by SM   Staphylococcus epidermidis NOT DETECTED NOT DETECTED Final   Staphylococcus lugdunensis NOT DETECTED NOT DETECTED Final   Streptococcus species NOT DETECTED NOT DETECTED Final   Streptococcus agalactiae NOT DETECTED NOT DETECTED Final   Streptococcus pneumoniae NOT DETECTED NOT DETECTED Final   Streptococcus pyogenes NOT DETECTED NOT DETECTED Final   A.calcoaceticus-baumannii NOT DETECTED NOT DETECTED Final   Bacteroides fragilis NOT DETECTED NOT DETECTED Final   Enterobacterales NOT DETECTED NOT DETECTED Final   Enterobacter cloacae complex NOT DETECTED NOT DETECTED Final   Escherichia coli NOT DETECTED NOT DETECTED Final   Klebsiella aerogenes NOT DETECTED NOT DETECTED Final   Klebsiella oxytoca NOT DETECTED NOT DETECTED Final   Klebsiella pneumoniae NOT DETECTED NOT DETECTED Final   Proteus species NOT DETECTED NOT DETECTED Final   Salmonella species NOT DETECTED NOT DETECTED Final   Serratia marcescens NOT DETECTED NOT DETECTED Final   Haemophilus influenzae NOT DETECTED NOT DETECTED Final   Neisseria meningitidis NOT DETECTED NOT DETECTED Final   Pseudomonas aeruginosa NOT DETECTED NOT DETECTED Final   Stenotrophomonas maltophilia NOT DETECTED NOT DETECTED Final   Candida albicans NOT DETECTED NOT DETECTED Final   Candida auris NOT DETECTED NOT DETECTED Final   Candida glabrata NOT DETECTED NOT DETECTED Final   Candida krusei NOT DETECTED NOT DETECTED Final   Candida  parapsilosis NOT DETECTED NOT DETECTED Final   Candida tropicalis NOT DETECTED NOT DETECTED Final   Cryptococcus neoformans/gattii NOT DETECTED NOT DETECTED Final   Meth resistant mecA/C and MREJ NOT DETECTED NOT DETECTED Final    Comment: Performed at [facility], [address] [place], [place] [address]  Blood Culture (routine x 2)     Status: None (Preliminary result)   Collection Time: 06/26/24  8:49 AM   Specimen: BLOOD LEFT ARM  Result Value Ref Range Status   Specimen Description   Final    BLOOD LEFT ARM Performed at [facility], [address] [place], [place] [address]    Special Requests   Final    BOTTLES DRAWN AEROBIC AND ANAEROBIC Blood Culture results may not  be optimal due to an inadequate volume of blood received in culture bottles Performed at [facility], [address], [place], [place] [address]    Culture   Final    NO GROWTH 4 DAYS Performed at [facility], [address], [place], [place] [address]    Report Status PENDING  Incomplete  Culture, blood (Routine X 2) w Reflex to ID Panel     Status: None (Preliminary result)   Collection Time: 06/28/24 11:10 AM   Specimen: BLOOD LEFT ARM  Result Value Ref Range Status   Specimen Description   Final    BLOOD LEFT ARM Performed at [facility], [address] [place], [place] [address]    Special Requests   Final    BOTTLES DRAWN AEROBIC ONLY Blood Culture results may not be optimal due to an inadequate volume of blood received in culture bottles Performed at [facility], [address], [place], [place] [address]    Culture   Final    NO GROWTH 2 DAYS Performed at [facility], [address], [place], [place] [address]    Report Status PENDING  Incomplete  Culture, blood (Routine X 2) w Reflex to ID Panel     Status: None (Preliminary result)   Collection Time: 06/28/24 11:15 AM   Specimen: BLOOD LEFT HAND  Result Value Ref  Range Status   Specimen Description   Final    BLOOD LEFT HAND Performed at [facility], [address] [place], [place] [address]    Special Requests   Final    BOTTLES DRAWN AEROBIC ONLY Blood Culture results may not be optimal due to an inadequate volume of blood received in culture bottles Performed at [facility], [address], [place], [place] [address]    Culture   Final    NO GROWTH 2 DAYS Performed at [facility], [address] [place], [place] [id number]    Report Status PENDING  Incomplete     Labs: BNP (last 3 results) Recent Labs    08/20/23 1934  BNP 233.9*   Basic Metabolic Panel: Recent Labs  Lab 06/25/24 1800 06/27/24 0550 06/29/24 0554 06/30/24 0831  NA 138 142 142 141  K 5.0 3.8 3.3* 3.0*  CL 102 107 105 105  CO2 24 24 25 24   GLUCOSE 119* 101* 83 92  BUN 33* 27* 21 18  CREATININE 1.36* 0.95 0.65 0.67  CALCIUM  9.6 8.7* 8.7* 8.6*  MG  --  2.3 2.1 2.2  PHOS  --  2.8  --   --    Liver Function Tests: Recent Labs  Lab 06/25/24 1800  AST 28  ALT 10  ALKPHOS 161*  BILITOT 0.8  PROT 7.2  ALBUMIN 3.4*   No results for input(s): LIPASE, AMYLASE in the last 168 hours. No results for input(s): AMMONIA in the last 168 hours. CBC: Recent Labs  Lab 06/25/24 1800 06/27/24 0550 06/28/24 1202 06/29/24 0554 06/30/24 0831  WBC 26.8* 18.2* 13.9* 10.7* 11.5*  NEUTROABS 25.1*  --   --   --   --   HGB 10.8* 9.0* 9.7* 10.5* 10.5*  HCT 34.7* 29.9* 30.1* 32.7* 32.2*  MCV 100.0 96.1 95.3 96.2 95.0  PLT 213 157 199 213 248   Cardiac Enzymes: No results for input(s): CKTOTAL, CKMB, CKMBINDEX, TROPONINI in the last 168 hours. BNP: Invalid input(s): POCBNP CBG: No results for input(s): GLUCAP  in the last 168 hours. D-Dimer No results for input(s): DDIMER in the last 72 hours. Hgb A1c No results for input(s): HGBA1C in the last 72 hours. Lipid Profile No results for input(s): CHOL, HDL,  LDLCALC, TRIG, CHOLHDL, LDLDIRECT in the last 72 hours. Thyroid  function studies No results for input(s): TSH, T4TOTAL, T3FREE, THYROIDAB in the last 72 hours.  Invalid input(s): FREET3 Anemia work up No results for input(s): VITAMINB12, FOLATE, FERRITIN, TIBC, IRON , RETICCTPCT in the last 72 hours. Urinalysis    Component Value Date/Time   COLORURINE YELLOW 06/25/2024 1920   APPEARANCEUR CLEAR 06/25/2024 1920   APPEARANCEUR Hazy 01/07/2015 1812   LABSPEC 1.015 06/25/2024 1920   LABSPEC 1.014 01/07/2015 1812   PHURINE 5.5 06/25/2024 1920   GLUCOSEU NEGATIVE 06/25/2024 1920   GLUCOSEU Negative 01/07/2015 1812   HGBUR TRACE (A) 06/25/2024 1920   BILIRUBINUR NEGATIVE 06/25/2024 1920   BILIRUBINUR Negative 01/07/2015 1812   KETONESUR NEGATIVE 06/25/2024 1920   PROTEINUR NEGATIVE 06/25/2024 1920   NITRITE NEGATIVE 06/25/2024 1920   LEUKOCYTESUR NEGATIVE 06/25/2024 1920   LEUKOCYTESUR Negative 01/07/2015 1812   Sepsis Labs Recent Labs  Lab 06/27/24 0550 06/28/24 1202 06/29/24 0554 06/30/24 0831  WBC 18.2* 13.9* 10.7* 11.5*   Microbiology Recent Results (from the past 240 hours)  Blood Culture (routine x 2)     Status: Abnormal   Collection Time: 06/25/24  6:00 PM   Specimen: BLOOD RIGHT ARM  Result Value Ref Range Status   Specimen Description   Final    BLOOD RIGHT ARM Performed at [facility], [address] [place], [place] [address]    Special Requests   Final    BOTTLES DRAWN AEROBIC AND ANAEROBIC Blood Culture adequate volume Performed at [facility], [address], [place], [place] [address]    Culture  Setup Time   Final    GRAM POSITIVE COCCI IN BOTH AEROBIC AND ANAEROBIC BOTTLES CRITICAL RESULT CALLED TO, READ BACK BY AND VERIFIED WITH: PHARMD Utomwen A on 100425 @1045  by SM Performed at [facility], [address] [place], [place] [address]    Culture STAPHYLOCOCCUS AUREUS (A)  Final    Report Status 06/28/2024 FINAL  Final   Organism ID, Bacteria STAPHYLOCOCCUS AUREUS  Final      Susceptibility   Staphylococcus aureus - MIC*    CIPROFLOXACIN  >=8 RESISTANT Resistant     ERYTHROMYCIN >=8 RESISTANT Resistant     GENTAMICIN <=0.5 SENSITIVE Sensitive     OXACILLIN 0.5 SENSITIVE Sensitive     TETRACYCLINE <=1 SENSITIVE Sensitive     VANCOMYCIN  1 SENSITIVE Sensitive     TRIMETH /SULFA  >=320 RESISTANT Resistant     CLINDAMYCIN  <=0.25 SENSITIVE Sensitive     RIFAMPIN <=0.5 SENSITIVE Sensitive     Inducible Clindamycin  NEGATIVE Sensitive     LINEZOLID 2 SENSITIVE Sensitive     * STAPHYLOCOCCUS AUREUS  Blood Culture ID Panel (Reflexed)     Status: Abnormal   Collection Time: 06/25/24  6:00 PM  Result Value Ref Range Status   Enterococcus faecalis NOT DETECTED NOT DETECTED Final   Enterococcus Faecium NOT DETECTED NOT DETECTED Final   Listeria monocytogenes NOT DETECTED NOT DETECTED Final   Staphylococcus species DETECTED (A) NOT DETECTED Final    Comment: PHARMD Utomwen A on 100425 @1045  by SM   Staphylococcus aureus (BCID) DETECTED (A) NOT DETECTED Final    Comment: PHARMD Utomwen A on 100425 @1045  by SM   Staphylococcus epidermidis NOT DETECTED NOT  DETECTED Final   Staphylococcus lugdunensis NOT DETECTED NOT DETECTED Final   Streptococcus species NOT DETECTED NOT DETECTED Final   Streptococcus agalactiae NOT DETECTED NOT DETECTED Final   Streptococcus pneumoniae NOT DETECTED NOT DETECTED Final   Streptococcus pyogenes NOT DETECTED NOT DETECTED Final   A.calcoaceticus-baumannii NOT DETECTED NOT DETECTED Final   Bacteroides fragilis NOT DETECTED NOT DETECTED Final   Enterobacterales NOT DETECTED NOT DETECTED Final   Enterobacter cloacae complex NOT DETECTED NOT DETECTED Final   Escherichia coli NOT DETECTED NOT DETECTED Final   Klebsiella aerogenes NOT DETECTED NOT DETECTED Final   Klebsiella oxytoca NOT DETECTED NOT DETECTED Final   Klebsiella pneumoniae NOT DETECTED  NOT DETECTED Final   Proteus species NOT DETECTED NOT DETECTED Final   Salmonella species NOT DETECTED NOT DETECTED Final   Serratia marcescens NOT DETECTED NOT DETECTED Final   Haemophilus influenzae NOT DETECTED NOT DETECTED Final   Neisseria meningitidis NOT DETECTED NOT DETECTED Final   Pseudomonas aeruginosa NOT DETECTED NOT DETECTED Final   Stenotrophomonas maltophilia NOT DETECTED NOT DETECTED Final   Candida albicans NOT DETECTED NOT DETECTED Final   Candida auris NOT DETECTED NOT DETECTED Final   Candida glabrata NOT DETECTED NOT DETECTED Final   Candida krusei NOT DETECTED NOT DETECTED Final   Candida parapsilosis NOT DETECTED NOT DETECTED Final   Candida tropicalis NOT DETECTED NOT DETECTED Final   Cryptococcus neoformans/gattii NOT DETECTED NOT DETECTED Final   Meth resistant mecA/C and MREJ NOT DETECTED NOT DETECTED Final    Comment: Performed at [facility], [address], [place], [place] [address]  Blood Culture (routine x 2)     Status: None (Preliminary result)   Collection Time: 06/26/24  8:49 AM   Specimen: BLOOD LEFT ARM  Result Value Ref Range Status   Specimen Description   Final    BLOOD LEFT ARM Performed at [facility], [address] [place], [place] [address]    Special Requests   Final    BOTTLES DRAWN AEROBIC AND ANAEROBIC Blood Culture results may not be optimal due to an inadequate volume of blood received in culture bottles Performed at [facility], [address], [place], [place] [address]    Culture   Final    NO GROWTH 4 DAYS Performed at [facility], [address], [place], [place] [address]    Report Status PENDING  Incomplete  Culture, blood (Routine X 2) w Reflex to ID Panel     Status: None (Preliminary result)   Collection Time: 06/28/24 11:10 AM   Specimen: BLOOD LEFT ARM  Result Value Ref Range Status   Specimen Description   Final    BLOOD LEFT ARM Performed at [facility],  [address] [place], [place] [address]    Special Requests   Final    BOTTLES DRAWN AEROBIC ONLY Blood Culture results may not be optimal due to an inadequate volume of blood received in culture bottles Performed at [facility], [address] [place], [place] [address]    Culture   Final    NO GROWTH 2 DAYS Performed at [facility], [address] [place], [place] [address]    Report Status PENDING  Incomplete  Culture, blood (Routine X 2) w Reflex to ID Panel     Status: None (Preliminary result)   Collection Time: 06/28/24 11:15 AM   Specimen: BLOOD LEFT HAND  Result Value Ref Range Status  Specimen Description   Final    BLOOD LEFT HAND Performed at [facility], [address], [place], [place] [address]    Special Requests   Final    BOTTLES DRAWN AEROBIC ONLY Blood Culture results may not be optimal due to an inadequate volume of blood received in culture bottles Performed at [facility], [address], [place], [place] [address]    Culture   Final    NO GROWTH 2 DAYS Performed at [facility], [address] [place], [place] [address]    Report Status PENDING  Incomplete     Time coordinating discharge: 40 minutes  SIGNED:   Renato Applebaum, MD  [facility] 06/30/2024, 11:50 AM

## 2024-06-30 NOTE — Plan of Care (Signed)

## 2024-06-30 NOTE — TOC Transition Note (Addendum)
 Transition of Care [facility]) - Discharge Note   Patient Details  Name: Tracy Dickerson MRN: [id number] Date of Birth: [birth year]  Transition of Care [facility]) CM/SW Contact:  Doneta Glenys DASEN, RN Phone Number: 06/30/2024, 11:54 AM   Clinical Narrative:     Per MD patient ready for [facility] to [facility] [place]. RN to call report prior to discharge [phone]). RN, patient, patient's legal guardian and facility notified of DC.  DC packet on chart includes face sheet, medical necessity, discharge summary, signed DNR and prescriptions.  Ambulance [facility] transport requested for patient.   CM will sign off. Please consult us  again if new needs arise.     Final next level of care: Hospice Medical Facility Barriers to Discharge: Barriers Resolved   Patient Goals and CMS Choice Patient states their goals for this hospitalization and ongoing recovery are:: Home CMS Medicare.gov Compare Post Acute Care list provided to::  (NA) Choice offered to / list presented to : NA [place] ownership interest in [facility].provided to:: Parent NA    Discharge Placement                Patient to be transferred to facility by: [facility] Name of family member notified: Roberto Planas (Legal Guardian)  [phone] Patient and family notified of of transfer: 06/30/24  Discharge Plan and Services Additional resources added to the After Visit Summary for   In-house Referral: NA Discharge Planning Services: CM Consult            DME Arranged: N/A DME Agency: NA       HH Arranged: NA HH Agency: NA        Social Drivers of Health (SDOH) Interventions SDOH Screenings   Food Insecurity: No Food Insecurity (06/26/2024)  Housing: Low Risk  (06/26/2024)  Transportation Needs: Patient Unable To Answer (06/02/2024)  Utilities: Not At Risk (11/21/2023)  Financial Resource Strain: Low Risk  (02/04/2023)   Received from [facility]  Physical Activity: Inactive (07/16/2021)   Received from [facility]  Social Connections: Patient Unable To Answer (06/26/2024)  Stress: No Stress Concern Present (07/16/2021)   Received from [facility]  Tobacco Use: Medium Risk (06/04/2024)  Health Literacy: Medium Risk (07/16/2021)   Received from [facility]     Readmission Risk Interventions    03/27/2024   11:14 AM  Readmission Risk Prevention Plan  Transportation Screening Complete  Medication Review (RN Care Manager) Complete  PCP or Specialist appointment within 3-5 days of discharge Complete  HRI or Home Care Consult Complete  SW Recovery Care/Counseling Consult Complete  Palliative Care Screening Not Applicable  Skilled Nursing Facility Not Applicable

## 2024-07-01 LAB — CULTURE, BLOOD (ROUTINE X 2): Culture: NO GROWTH

## 2024-07-03 LAB — CULTURE, BLOOD (ROUTINE X 2)
Culture: NO GROWTH
Culture: NO GROWTH

## 2024-07-16 ENCOUNTER — Other Ambulatory Visit
